# Patient Record
Sex: Male | Born: 1955
Health system: Southern US, Community
[De-identification: ages and names within clinical notes are randomized; demographics above are authoritative.]

## PROBLEM LIST (undated history)

## (undated) ENCOUNTER — Emergency Department (HOSPITAL_COMMUNITY): Admission: EM | Payer: Non-veteran care | Source: Home / Self Care

## (undated) DIAGNOSIS — F1010 Alcohol abuse, uncomplicated: Secondary | ICD-10-CM

## (undated) DIAGNOSIS — E119 Type 2 diabetes mellitus without complications: Secondary | ICD-10-CM

## (undated) DIAGNOSIS — B029 Zoster without complications: Secondary | ICD-10-CM

## (undated) DIAGNOSIS — I639 Cerebral infarction, unspecified: Secondary | ICD-10-CM

## (undated) DIAGNOSIS — F1490 Cocaine use, unspecified, uncomplicated: Secondary | ICD-10-CM

## (undated) DIAGNOSIS — G629 Polyneuropathy, unspecified: Secondary | ICD-10-CM

## (undated) DIAGNOSIS — F329 Major depressive disorder, single episode, unspecified: Secondary | ICD-10-CM

## (undated) DIAGNOSIS — F32A Depression, unspecified: Secondary | ICD-10-CM

## (undated) DIAGNOSIS — G5600 Carpal tunnel syndrome, unspecified upper limb: Secondary | ICD-10-CM

## (undated) DIAGNOSIS — I38 Endocarditis, valve unspecified: Secondary | ICD-10-CM

## (undated) DIAGNOSIS — F4310 Post-traumatic stress disorder, unspecified: Secondary | ICD-10-CM

## (undated) HISTORY — PX: TESTICLE TORSION REDUCTION: SHX795

---

## 2013-12-02 ENCOUNTER — Encounter (HOSPITAL_COMMUNITY): Payer: Self-pay | Admitting: *Deleted

## 2013-12-02 ENCOUNTER — Emergency Department (HOSPITAL_COMMUNITY)
Admission: EM | Admit: 2013-12-02 | Discharge: 2013-12-02 | Disposition: A | Discharge status: Home / Self Care | Payer: Non-veteran care | Attending: Emergency Medicine | Admitting: Emergency Medicine

## 2013-12-02 DIAGNOSIS — Z720 Tobacco use: Secondary | ICD-10-CM | POA: Insufficient documentation

## 2013-12-02 DIAGNOSIS — Z794 Long term (current) use of insulin: Secondary | ICD-10-CM

## 2013-12-02 DIAGNOSIS — IMO0001 Reserved for inherently not codable concepts without codable children: Secondary | ICD-10-CM

## 2013-12-02 DIAGNOSIS — Z79899 Other long term (current) drug therapy: Secondary | ICD-10-CM | POA: Insufficient documentation

## 2013-12-02 DIAGNOSIS — E1349 Other specified diabetes mellitus with other diabetic neurological complication: Secondary | ICD-10-CM | POA: Insufficient documentation

## 2013-12-02 DIAGNOSIS — R200 Anesthesia of skin: Secondary | ICD-10-CM | POA: Insufficient documentation

## 2013-12-02 DIAGNOSIS — E119 Type 2 diabetes mellitus without complications: Secondary | ICD-10-CM

## 2013-12-02 DIAGNOSIS — G629 Polyneuropathy, unspecified: Secondary | ICD-10-CM | POA: Insufficient documentation

## 2013-12-02 HISTORY — DX: Polyneuropathy, unspecified: G62.9

## 2013-12-02 HISTORY — DX: Type 2 diabetes mellitus without complications: E11.9

## 2013-12-02 LAB — CBG MONITORING, ED: Glucose-Capillary: 483 mg/dL — ABNORMAL HIGH (ref 70–99)

## 2013-12-02 MED ORDER — GABAPENTIN 100 MG PO CAPS: 100.0000 mg | ORAL_CAPSULE | Freq: Three times a day (TID) | ORAL | Status: DC

## 2013-12-02 NOTE — ED Provider Notes (Signed)
CSN: [id number]     Arrival date & time 12/02/13  1156 History  This chart was scribed for non-physician practitioner Dierdre Forth, PA-C, working with Linwood Dibbles, MD by Littie Deeds, ED Scribe. This patient was seen in room TR08C/TR08C and the patient's care was started at 2:08 PM.     Chief Complaint  Patient presents with  . Foot Pain    The history is provided by the patient. No language interpreter was used.   HPI Comments: William Acevedo is a 58 y.o. male with a hx of DM and diabetic neuropathy who presents to the Emergency Department complaining of gradual onset, gradually worsening, constant, severe, striking bilateral foot pain that has worsened today. Patient normally takes Gabapentin (1 pill per day) for his pain, but has run out because he has shared some with his sister who has similar symptoms but worse than his. He normally gets his medication from the [place], but will not get any more medication until December 1. Patient denies any injuries. He has not been taking his insulin as directed or checking his CBG, but reports that he has plenty of insulin at home.  Patient denies fever, polyuria, polydipsia, abdominal pain, nausea, vomiting, foot wounds.  Past Medical History  Diagnosis Date  . Diabetes mellitus without complication   . Neuropathy    History reviewed. No pertinent past surgical history. History reviewed. No pertinent family history. History  Substance Use Topics  . Smoking status: Current Every Day Smoker  . Smokeless tobacco: Not on file  . Alcohol Use: Not on file    Review of Systems  Constitutional: Negative for fever, chills, diaphoresis, appetite change, fatigue and unexpected weight change.  HENT: Negative for mouth sores.   Eyes: Negative for visual disturbance.  Respiratory: Negative for cough, chest tightness, shortness of breath and wheezing.   Cardiovascular: Negative for chest pain.  Gastrointestinal: Negative for nausea, vomiting,  abdominal pain, diarrhea and constipation.  Endocrine: Negative for polydipsia, polyphagia and polyuria.  Genitourinary: Negative for dysuria, urgency, frequency and hematuria.  Musculoskeletal: Positive for myalgias and arthralgias. Negative for back pain and neck stiffness.  Skin: Negative for rash.  Allergic/Immunologic: Negative for immunocompromised state.  Neurological: Positive for numbness (feet). Negative for syncope, light-headedness and headaches.  Hematological: Does not bruise/bleed easily.  Psychiatric/Behavioral: Negative for sleep disturbance. The patient is not nervous/anxious.       Allergies  Review of patient's allergies indicates no known allergies.  Home Medications   Prior to Admission medications   Medication Sig Start Date End Date Taking? Authorizing Provider  traZODone (DESYREL) 50 MG tablet Take 50 mg by mouth daily as needed for sleep.   Yes Historical Provider, MD  gabapentin (NEURONTIN) 100 MG capsule Take 1 capsule (100 mg total) by mouth 3 (three) times daily. 12/02/13   Mariesha Venturella, PA-C   BP 129/98 mmHg  Pulse 82  Temp(Src) 98.4 F (36.9 C) (Oral)  Resp 18  Ht 5\' 6"  (1.676 m)  Wt 135 lb (61.236 kg)  BMI 21.80 kg/m2  SpO2 100% Physical Exam  Constitutional: He appears well-developed and well-nourished. No distress.  Awake, alert, nontoxic appearance  HENT:  Head: Normocephalic and atraumatic.  Mouth/Throat: Oropharynx is clear and moist. No oropharyngeal exudate.  Moist mucous membranes  Eyes: Conjunctivae are normal. No scleral icterus.  Neck: Normal range of motion. Neck supple.  Cardiovascular: Normal rate, regular rhythm, normal heart sounds and intact distal pulses.   No murmur heard. Capillary refill less than 3  seconds  Pulmonary/Chest: Effort normal and breath sounds normal. No respiratory distress. He has no wheezes.  Equal chest expansion  Abdominal: Soft. Bowel sounds are normal. He exhibits no mass. There is no  tenderness. There is no rebound and no guarding.  Abdomen soft and nontender  Musculoskeletal: Normal range of motion. He exhibits tenderness. He exhibits no edema.  ROM: full range of motion of all joints of the bilateral lower extremities  Neurological: He is alert. Coordination normal.  Sensation intact to dull and sharp, but diminished in the feet Strength 5/5 the bilateral lower extremities  Skin: Skin is warm and dry. He is not diaphoretic. No erythema.  No tenting of the skin No lesions noted to the feet, small calluses noted on bilateral feet without erythema, induration or evidence of cellulitis  Psychiatric: He has a normal mood and affect.  Nursing note and vitals reviewed.   ED Course  Procedures  DIAGNOSTIC STUDIES: Oxygen Saturation is 100% on room air, normal by my interpretation.    COORDINATION OF CARE: 2:12 PM-Discussed treatment plan which includes Gabapentin refill with pt at bedside and pt agreed to plan.    Labs Review Labs Reviewed  CBG MONITORING, ED - Abnormal; Notable for the following:    Glucose-Capillary 483 (*)    All other components within normal limits    Imaging Review No results found.   EKG Interpretation None      MDM   Final diagnoses:  Other diabetic neurological complication associated with other specified diabetes mellitus  IDDM (insulin dependent diabetes mellitus)    William Acevedo resents for refill of his gabapentin. Patient does not know how much she was taking it but states he took it once per day. He also requests referral to podiatrist and primary care. Will give things. Patient has not been checking blood sugar however feet are without evidence of diabetic wounds.    Pt CBG 483 and I expressed significant concern about this to the patient.  I have recommended blood work, fluids and insulin here in the hospital today.  Pt reports he will not stay for this.  He reports he has insulin at home that he can take.    We  discussed the nature and purpose, risks and benefits, as well as, the alternatives of treatment. Time was given to allow the opportunity to ask questions and consider options, and after the discussion, the patient decided to refuse the offerred treatment for his hyperglycemia. The patient was informed that refusal could lead to, but was not limited to, death, permanent disability, or severe pain and included the potential for permanent organ damage. No relatives were present to discuss this with. Prior to refusing, I determined that the patient had the capacity to make their decision and understood the consequences of that decision. After refusal, I made every reasonable opportunity to treat them to the best of my ability. Pt was given a refill of his gabapentin.   The patient was notified that they may return to the emergency department at any time for further treatment.    I have personally reviewed patient's vitals, nursing note and any pertinent labs or imaging.  Vital signs are stable at discharge and pt does not appear septic or toxic.   BP 129/98 mmHg  Pulse 82  Temp(Src) 98.4 F (36.9 C) (Oral)  Resp 18  Ht 5\' 6"  (1.676 m)  Wt 135 lb (61.236 kg)  BMI 21.80 kg/m2  SpO2 100%  I personally performed the  services described in this documentation, which was scribed in my presence. The recorded information has been reviewed and is accurate.    Dierdre ForthHannah Arland Usery, PA-C 12/02/13 1644  Linwood DibblesJon Knapp, MD 12/03/13 [phone]

## 2013-12-02 NOTE — ED Notes (Signed)
CBG 483 

## 2013-12-02 NOTE — ED Notes (Signed)
Patient states doesn't want to stay for additional treatment.   Advised PA  - who went and spoke with patient about CBG being high.  Patient advised he would take care of it at home.

## 2013-12-02 NOTE — Discharge Instructions (Signed)
1. Medications: gabapentin refill, usual home medications 2. Treatment: rest, drink plenty of fluids, take your insulin as directed, monitor blood glucose 3. Follow Up: Please followup with your primary doctor in 2 days for discussion of your diagnoses and further evaluation after today's visit; if you do not have a primary care doctor use the resource guide provided to find one; Please return to the ER for worsening symptoms, polyuria, polydipsia, abdominal pain or other concerning symptoms.    Blood Glucose Monitoring Monitoring your blood glucose (also know as blood sugar) helps you to manage your diabetes. It also helps you and your health care provider monitor your diabetes and determine how well your treatment plan is working. WHY SHOULD YOU MONITOR YOUR BLOOD GLUCOSE?  It can help you understand how food, exercise, and medicine affect your blood glucose.  It allows you to know what your blood glucose is at any given moment. You can quickly tell if you are having low blood glucose (hypoglycemia) or high blood glucose (hyperglycemia).  It can help you and your health care provider know how to adjust your medicines.  It can help you understand how to manage an illness or adjust medicine for exercise. WHEN SHOULD YOU TEST? Your health care provider will help you decide how often you should check your blood glucose. This may depend on the type of diabetes you have, your diabetes control, or the types of medicines you are taking. Be sure to write down all of your blood glucose readings so that this information can be reviewed with your health care provider. See below for examples of testing times that your health care provider may suggest. Type 1 Diabetes  Test 4 times a day if you are in good control, using an insulin pump, or perform multiple daily injections.  If your diabetes is not well controlled or if you are sick, you may need to monitor more often.  It is a good idea to also  monitor:  Before and after exercise.  Between meals and 2 hours after a meal.  Occasionally between 2:00 a.m. and 3:00 a.m. Type 2 Diabetes  It can vary with each person, but generally, if you are on insulin, test 4 times a day.  If you take medicines by mouth (orally), test 2 times a day.  If you are on a controlled diet, test once a day.  If your diabetes is not well controlled or if you are sick, you may need to monitor more often. HOW TO MONITOR YOUR BLOOD GLUCOSE Supplies Needed  Blood glucose meter.  Test strips for your meter. Each meter has its own strips. You must use the strips that go with your own meter.  A pricking needle (lancet).  A device that holds the lancet (lancing device).  A journal or log book to write down your results. Procedure  Wash your hands with soap and water. Alcohol is not preferred.  Prick the side of your finger (not the tip) with the lancet.  Gently milk the finger until a small drop of blood appears.  Follow the instructions that come with your meter for inserting the test strip, applying blood to the strip, and using your blood glucose meter. Other Areas to Get Blood for Testing Some meters allow you to use other areas of your body (other than your finger) to test your blood. These areas are called alternative sites. The most common alternative sites are:  The forearm.  The thigh.  The back area of the  lower leg.  The palm of the hand. The blood flow in these areas is slower. Therefore, the blood glucose values you get may be delayed, and the numbers are different from what you would get from your fingers. Do not use alternative sites if you think you are having hypoglycemia. Your reading will not be accurate. Always use a finger if you are having hypoglycemia. Also, if you cannot feel your lows (hypoglycemia unawareness), always use your fingers for your blood glucose checks. ADDITIONAL TIPS FOR GLUCOSE MONITORING  Do not reuse  lancets.  Always carry your supplies with you.  All blood glucose meters have a 24-hour "hotline" number to call if you have questions or need help.  Adjust (calibrate) your blood glucose meter with a control solution after finishing a few boxes of strips. BLOOD GLUCOSE RECORD KEEPING It is a good idea to keep a daily record or log of your blood glucose readings. Most glucose meters, if not all, keep your glucose records stored in the meter. Some meters come with the ability to download your records to your home computer. Keeping a record of your blood glucose readings is especially helpful if you are wanting to look for patterns. Make notes to go along with the blood glucose readings because you might forget what happened at that exact time. Keeping good records helps you and your health care provider to work together to achieve good diabetes management.  Document Released: 01/06/2003 Document Revised: 05/20/2013 Document Reviewed: 05/28/2012 [facility] Patient Information 2015 [place], [place]. This information is not intended to replace advice given to you by your health care provider. Make sure you discuss any questions you have with your health care provider.   Peripheral Neuropathy Peripheral neuropathy is a type of nerve damage. It affects nerves that carry signals between the spinal cord and other parts of the body. These are called peripheral nerves. With peripheral neuropathy, one nerve or a group of nerves may be damaged.  CAUSES  Many things can damage peripheral nerves. For some people with peripheral neuropathy, the cause is unknown. Some causes include:  Diabetes. This is the most common cause of peripheral neuropathy.  Injury to a nerve.  Pressure or stress on a nerve that lasts a long time.  Too little vitamin B. Alcoholism can lead to this.  Infections.  Autoimmune diseases, such as multiple sclerosis and systemic lupus erythematosus.  Inherited nerve diseases.  Some  medicines, such as cancer drugs.  Toxic substances, such as lead and mercury.  Too little blood flowing to the legs.  Kidney disease.  Thyroid disease. SIGNS AND SYMPTOMS  Different people have different symptoms. The symptoms you have will depend on which of your nerves is damaged. Common symptoms include:  Loss of feeling (numbness) in the feet and hands.  Tingling in the feet and hands.  Pain that burns.  Very sensitive skin.  Weakness.  Not being able to move a part of the body (paralysis).  Muscle twitching.  Clumsiness or poor coordination.  Loss of balance.  Not being able to control your bladder.  Feeling dizzy.  Sexual problems. DIAGNOSIS  Peripheral neuropathy is a symptom, not a disease. Finding the cause of peripheral neuropathy can be hard. To figure that out, your health care provider will take a medical history and do a physical exam. A neurological exam will also be done. This involves checking things affected by your brain, spinal cord, and nerves (nervous system). For example, your health care provider will check your  reflexes, how you move, and what you can feel.  Other types of tests may also be ordered, such as:  Blood tests.  A test of the fluid in your spinal cord.  Imaging tests, such as CT scans or an MRI.  Electromyography (EMG). This test checks the nerves that control muscles.  Nerve conduction velocity tests. These tests check how fast messages pass through your nerves.  Nerve biopsy. A small piece of nerve is removed. It is then checked under a microscope. TREATMENT   Medicine is often used to treat peripheral neuropathy. Medicines may include:  Pain-relieving medicines. Prescription or over-the-counter medicine may be suggested.  Antiseizure medicine. This may be used for pain.  Antidepressants. These also may help ease pain from neuropathy.  Lidocaine. This is a numbing medicine. You might wear a patch or be given a  shot.  Mexiletine. This medicine is typically used to help control irregular heart rhythms.  Surgery. Surgery may be needed to relieve pressure on a nerve or to destroy a nerve that is causing pain.  Physical therapy to help movement.  Assistive devices to help movement. HOME CARE INSTRUCTIONS   Only take over-the-counter or prescription medicines as directed by your health care provider. Follow the instructions carefully for any given medicines. Do not take any other medicines without first getting approval from your health care provider.  If you have diabetes, work closely with your health care provider to keep your blood sugar under control.  If you have numbness in your feet:  Check every day for signs of injury or infection. Watch for redness, warmth, and swelling.  Wear padded socks and comfortable shoes. These help protect your feet.  Do not do things that put pressure on your damaged nerve.  Do not smoke. Smoking keeps blood from getting to damaged nerves.  Avoid or limit alcohol. Too much alcohol can cause a lack of B vitamins. These vitamins are needed for healthy nerves.  Develop a good support system. Coping with peripheral neuropathy can be stressful. Talk to a mental health specialist or join a support group if you are struggling.  Follow up with your health care provider as directed. SEEK MEDICAL CARE IF:   You have new signs or symptoms of peripheral neuropathy.  You are struggling emotionally from dealing with peripheral neuropathy.  You have a fever. SEEK IMMEDIATE MEDICAL CARE IF:   You have an injury or infection that is not healing.  You feel very dizzy or begin vomiting.  You have chest pain.  You have trouble breathing. Document Released: 12/24/2001 Document Revised: 09/15/2010 Document Reviewed: 09/10/2012 [facility] Patient Information 2015 [place], [place]. This information is not intended to replace advice given to you by your health care  provider. Make sure you discuss any questions you have with your health care provider.

## 2013-12-02 NOTE — ED Notes (Signed)
Pt in c/o pain to bilateral feet, states he has diabetic neuropathy and the pain is worse today, [place] [facility] [place] [facility] [place] [facility] [place] [facility]

## 2013-12-02 NOTE — ED Notes (Signed)
Patient states he is supposed to get [facility] [facility] [place], but has not called to check on shipment.   Patient states he has "been unable to call because of my depression".

## 2014-05-20 ENCOUNTER — Encounter (HOSPITAL_COMMUNITY): Payer: Self-pay | Admitting: Emergency Medicine

## 2014-05-20 ENCOUNTER — Emergency Department (HOSPITAL_COMMUNITY)
Admission: EM | Admit: 2014-05-20 | Discharge: 2014-05-20 | Disposition: A | Discharge status: Home / Self Care | Payer: Non-veteran care | Attending: Emergency Medicine | Admitting: Emergency Medicine

## 2014-05-20 DIAGNOSIS — Z79899 Other long term (current) drug therapy: Secondary | ICD-10-CM | POA: Insufficient documentation

## 2014-05-20 DIAGNOSIS — Z794 Long term (current) use of insulin: Secondary | ICD-10-CM | POA: Insufficient documentation

## 2014-05-20 DIAGNOSIS — M25512 Pain in left shoulder: Secondary | ICD-10-CM

## 2014-05-20 DIAGNOSIS — Z8669 Personal history of other diseases of the nervous system and sense organs: Secondary | ICD-10-CM | POA: Insufficient documentation

## 2014-05-20 DIAGNOSIS — Z720 Tobacco use: Secondary | ICD-10-CM | POA: Insufficient documentation

## 2014-05-20 DIAGNOSIS — M79641 Pain in right hand: Secondary | ICD-10-CM | POA: Insufficient documentation

## 2014-05-20 DIAGNOSIS — E119 Type 2 diabetes mellitus without complications: Secondary | ICD-10-CM | POA: Insufficient documentation

## 2014-05-20 MED ORDER — MELOXICAM 7.5 MG PO TABS: 7.5000 mg | ORAL_TABLET | Freq: Every day | ORAL | Status: DC

## 2014-05-20 MED ORDER — HYDROCODONE-ACETAMINOPHEN 5-325 MG PO TABS: 2.0000 | ORAL_TABLET | ORAL | Status: DC | PRN

## 2014-05-20 MED ORDER — KETOROLAC TROMETHAMINE 60 MG/2ML IM SOLN
60.0000 mg | Freq: Once | INTRAMUSCULAR | Status: AC
  Administered 2014-05-20: 60 mg via INTRAMUSCULAR
  Filled 2014-05-20: qty 2

## 2014-05-20 MED ORDER — OXYCODONE-ACETAMINOPHEN 5-325 MG PO TABS
1.0000 | ORAL_TABLET | Freq: Once | ORAL | Status: AC
  Administered 2014-05-20: 1 via ORAL
  Filled 2014-05-20: qty 1

## 2014-05-20 MED ORDER — METHYLPREDNISOLONE SODIUM SUCC 125 MG IJ SOLR
125.0000 mg | Freq: Once | INTRAMUSCULAR | Status: AC
  Administered 2014-05-20: 125 mg via INTRAMUSCULAR
  Filled 2014-05-20: qty 2

## 2014-05-20 NOTE — ED Provider Notes (Signed)
CSN: [id number]     Arrival date & time 05/20/14  7829 History   First MD Initiated Contact with Patient 05/20/14 1047     Chief Complaint  Patient presents with  . Shoulder Pain  . Hand Pain     (Consider location/radiation/quality/duration/timing/severity/associated sxs/prior Treatment) Patient is a 59 y.o. male presenting with shoulder pain and hand pain. The history is provided by the patient. No language interpreter was used.  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  Pt has been having problems with right hand x 6 years.  Pt reports he has carpal tunnel.  Pt reports he has pain in left shoulder for about 6 months.  Pt [facility]  [facility]  Pt reports pain increased after working on a roof yesterday.  Past Medical History  Diagnosis Date  . Diabetes mellitus without complication   . Neuropathy    Past Surgical History  Procedure Laterality Date  . Testicle torsion reduction     No family history on file. History  Substance Use Topics  . Smoking status: Current Every Day Smoker -- 0.25 packs/day  . Smokeless tobacco: Not on file  . Alcohol Use: No    Review of Systems  Musculoskeletal: Negative for back pain.  All other systems reviewed and are negative.     Allergies  Review of patient's allergies indicates no known allergies.  Home Medications   Prior to Admission medications   Medication Sig Start Date End Date Taking? Authorizing Provider  glipiZIDE (GLUCOTROL) 5 MG tablet Take 5 mg by  mouth daily before breakfast.   Yes Historical Provider, MD  insulin glargine (LANTUS) 100 UNIT/ML injection Inject 25 Units into the skin every morning.   Yes Historical Provider, MD  metFORMIN (GLUCOPHAGE) 500 MG tablet Take 1,000 mg by mouth 2 (two) times daily with a meal.   Yes Historical Provider, MD  omeprazole (PRILOSEC) 20 MG capsule Take 20 mg by mouth daily.   Yes Historical Provider, MD  sertraline (ZOLOFT) 50 MG tablet Take 50 mg by mouth daily.   Yes Historical Provider, MD  gabapentin (NEURONTIN) 100 MG capsule Take 1 capsule (100 mg total) by mouth 3 (three) times daily. Patient not taking: Reported on 05/20/2014 12/02/13   Dahlia Client Muthersbaugh, PA-C  HYDROcodone-acetaminophen (NORCO/VICODIN) 5-325 MG per tablet Take 2 tablets by mouth every 4 (four) hours as needed. 05/20/14   Elson Areas, PA-C  meloxicam (MOBIC) 7.5 MG tablet Take 1 tablet (7.5 mg total) by mouth daily. 05/20/14   Elson Areas, PA-C  traZODone (DESYREL) 50 MG tablet Take 50 mg by mouth daily as needed for sleep.    Historical Provider, MD   BP 150/75 mmHg  Pulse 76  Temp(Src) 97.8 F (36.6 C) (Oral)  Resp 18  SpO2 98% Physical Exam  Constitutional: He is oriented to person, place, and time. He appears well-developed and well-nourished.  HENT:  Head: Normocephalic.  Eyes: EOM are normal.  Neck: Normal  range of motion.  Cardiovascular: Normal rate.   Pulmonary/Chest: Effort normal.  Abdominal: He exhibits no distension.  Musculoskeletal:  Tender right hand,  No deformity,  nv intact.  Left shoulder  Limited range of motion,  nv and ns intact  Pain with palpation  Neurological: He is alert and oriented to person, place, and time.  Psychiatric: He has a normal mood and affect.  Nursing note and vitals reviewed.   ED Course  Procedures (including critical care time) Labs Review Labs Reviewed - No data to display  Imaging Review No results found.   EKG Interpretation None      MDM  Pt is  diabetic  I will treat with im dose of solumedrol.  Pt to watch glucose levels.  Torodol for pain.   Pt given rx for meloxicam and  Hydrocodone.     Final diagnoses:  Shoulder pain, left  Hand pain, right    [facility]    Elson AreasLeslie K Sofia, PA-C 05/20/14 1204  Derwood KaplanAnkit Nanavati, MD 05/22/14 [phone]

## 2014-05-20 NOTE — Discharge Instructions (Signed)

## 2014-05-20 NOTE — ED Notes (Signed)
Pt reports that he began having intense left shoulder and right hand pain after working on a roof yesterday. Pt alert x4.

## 2014-06-02 ENCOUNTER — Emergency Department (HOSPITAL_COMMUNITY)
Admission: EM | Admit: 2014-06-02 | Discharge: 2014-06-02 | Disposition: A | Discharge status: Home / Self Care | Payer: Non-veteran care | Attending: Emergency Medicine | Admitting: Emergency Medicine

## 2014-06-02 ENCOUNTER — Encounter (HOSPITAL_COMMUNITY): Payer: Self-pay | Admitting: Nurse Practitioner

## 2014-06-02 DIAGNOSIS — S6991XA Unspecified injury of right wrist, hand and finger(s), initial encounter: Secondary | ICD-10-CM | POA: Insufficient documentation

## 2014-06-02 DIAGNOSIS — G629 Polyneuropathy, unspecified: Secondary | ICD-10-CM | POA: Insufficient documentation

## 2014-06-02 DIAGNOSIS — Z720 Tobacco use: Secondary | ICD-10-CM | POA: Insufficient documentation

## 2014-06-02 DIAGNOSIS — Y9289 Other specified places as the place of occurrence of the external cause: Secondary | ICD-10-CM | POA: Insufficient documentation

## 2014-06-02 DIAGNOSIS — Z791 Long term (current) use of non-steroidal anti-inflammatories (NSAID): Secondary | ICD-10-CM | POA: Insufficient documentation

## 2014-06-02 DIAGNOSIS — Z79899 Other long term (current) drug therapy: Secondary | ICD-10-CM | POA: Insufficient documentation

## 2014-06-02 DIAGNOSIS — Z794 Long term (current) use of insulin: Secondary | ICD-10-CM | POA: Insufficient documentation

## 2014-06-02 DIAGNOSIS — Y9389 Activity, other specified: Secondary | ICD-10-CM | POA: Insufficient documentation

## 2014-06-02 DIAGNOSIS — M25512 Pain in left shoulder: Secondary | ICD-10-CM

## 2014-06-02 DIAGNOSIS — S8992XA Unspecified injury of left lower leg, initial encounter: Secondary | ICD-10-CM | POA: Insufficient documentation

## 2014-06-02 DIAGNOSIS — M25531 Pain in right wrist: Secondary | ICD-10-CM

## 2014-06-02 DIAGNOSIS — Y998 Other external cause status: Secondary | ICD-10-CM | POA: Insufficient documentation

## 2014-06-02 DIAGNOSIS — W1839XA Other fall on same level, initial encounter: Secondary | ICD-10-CM | POA: Insufficient documentation

## 2014-06-02 DIAGNOSIS — G8929 Other chronic pain: Secondary | ICD-10-CM

## 2014-06-02 DIAGNOSIS — E119 Type 2 diabetes mellitus without complications: Secondary | ICD-10-CM | POA: Insufficient documentation

## 2014-06-02 MED ORDER — KETOROLAC TROMETHAMINE 60 MG/2ML IM SOLN
60.0000 mg | Freq: Once | INTRAMUSCULAR | Status: AC
  Administered 2014-06-02: 60 mg via INTRAMUSCULAR
  Filled 2014-06-02: qty 2

## 2014-06-02 MED ORDER — DICLOFENAC POTASSIUM 50 MG PO TABS
50.0000 mg | ORAL_TABLET | Freq: Three times a day (TID) | ORAL | Status: DC
Start: 2014-06-02 — End: 2014-09-05

## 2014-06-02 NOTE — ED Notes (Signed)
Pt c/o chronic R hand and L shoulder pain.  He has history of carpal tunnel and rotator cuff injury. He tried meloxicam and an unknown muscle relaxer at home with no relief.

## 2014-06-02 NOTE — Discharge Instructions (Signed)
Please follow up with your primary care physician in 1-2 days. If you do not have one please call the [facility] number listed above. Please follow up with Dr. Ophelia CharterYates to schedule a follow up appointment.  Please read all discharge instructions and return precautions.   Shoulder Pain The shoulder is the joint that connects your arms to your body. The bones that form the shoulder joint include the upper arm bone (humerus), the shoulder blade (scapula), and the collarbone (clavicle). The top of the humerus is shaped like a ball and fits into a rather flat socket on the scapula (glenoid cavity). A combination of muscles and strong, fibrous tissues that connect muscles to bones (tendons) support your shoulder joint and hold the ball in the socket. Small, fluid-filled sacs (bursae) are located in different areas of the joint. They act as cushions between the bones and the overlying soft tissues and help reduce friction between the gliding tendons and the bone as you move your arm. Your shoulder joint allows a wide range of motion in your arm. This range of motion allows you to do things like scratch your back or throw a ball. However, this range of motion also makes your shoulder more prone to pain from overuse and injury. Causes of shoulder pain can originate from both injury and overuse and usually can be grouped in the following four categories:  Redness, swelling, and pain (inflammation) of the tendon (tendinitis) or the bursae (bursitis).  Instability, such as a dislocation of the joint.  Inflammation of the joint (arthritis).  Broken bone (fracture). HOME CARE INSTRUCTIONS   Apply ice to the sore area.  Put ice in a plastic bag.  Place a towel between your skin and the bag.  Leave the ice on for 15-20 minutes, 3-4 times per day for the first 2 days, or as directed by your health care provider.  Stop using cold packs if they do not help with the pain.  If you have a  shoulder sling or immobilizer, wear it as long as your caregiver instructs. Only remove it to shower or bathe. Move your arm as little as possible, but keep your hand moving to prevent swelling.  Squeeze a soft ball or foam pad as much as possible to help prevent swelling.  Only take over-the-counter or prescription medicines for pain, discomfort, or fever as directed by your caregiver. SEEK MEDICAL CARE IF:   Your shoulder pain increases, or new pain develops in your arm, hand, or fingers.  Your hand or fingers become cold and numb.  Your pain is not relieved with medicines. SEEK IMMEDIATE MEDICAL CARE IF:   Your arm, hand, or fingers are numb or tingling.  Your arm, hand, or fingers are significantly swollen or turn white or blue. MAKE SURE YOU:   Understand these instructions.  Will watch your condition.  Will get help right away if you are not doing well or get worse. Document Released: 10/13/2004 Document Revised: 05/20/2013 Document Reviewed: 12/18/2010 [facility] Patient Information 2015 [employer] [place]. This information is not intended to replace advice given to you by your health care provider. Make sure you discuss any questions you have with your health care provider.

## 2014-06-02 NOTE — ED Provider Notes (Signed)
CSN: [id number]     Arrival date & time 06/02/14  1603 History  This chart was scribed for non-physician practitioner, Francee PiccoloJennifer Gerrad Welker, working with Raeford RazorStephen Kohut, MD by William Acevedo, ED Scribe. This patient was seen in room TR01C/TR01C and the patient's care was started at 4:54 PM.   Chief Complaint  Patient presents with  . Hand Pain  . Shoulder Pain   The history is provided by the patient. No language interpreter was used.   HPI Comments: William Acevedo is a 59 y.o. male with a history of DM and neuropathy who presents to the Emergency Department complaining of chronic right hand pain. He reports a history of carpal tunnel syndrome in his right hand. Pt states he was changing the floor when he fell through the floor because it was rotten this morning. He says that the fall was only a few feet and he landed on his feet. Pt denies any head trauma or LOC. He also complains of left shoulder pain and says he "thinks he has a torn rotator cuff." He states he has never seen an orthopedic surgeon for his left shoulder. Pt denies any lower extremity pain.   Past Medical History  Diagnosis Date  . Diabetes mellitus without complication   . Neuropathy    Past Surgical History  Procedure Laterality Date  . Testicle torsion reduction     History reviewed. No pertinent family history. History  Substance Use Topics  . Smoking status: Current Every Day Smoker -- 0.25 packs/day  . Smokeless tobacco: Not on file  . Alcohol Use: No    Review of Systems  Musculoskeletal: Positive for myalgias and arthralgias.  Neurological: Negative for numbness.  All other systems reviewed and are negative.   Allergies  Review of patient's allergies indicates no known allergies.  Home Medications   Prior to Admission medications   Medication Sig Start Date End Date Taking? Authorizing Provider  diclofenac (CATAFLAM) 50 MG tablet Take 1 tablet (50 mg total) by mouth 3 (three) times daily.  06/02/14   Liliann File, PA-C  gabapentin (NEURONTIN) 100 MG capsule Take 1 capsule (100 mg total) by mouth 3 (three) times daily. Patient not taking: Reported on 05/20/2014 12/02/13   Dahlia ClientHannah Muthersbaugh, PA-C  glipiZIDE (GLUCOTROL) 5 MG tablet Take 5 mg by mouth daily before breakfast.    Historical Provider, MD  HYDROcodone-acetaminophen (NORCO/VICODIN) 5-325 MG per tablet Take 2 tablets by mouth every 4 (four) hours as needed. 05/20/14   Elson AreasLeslie K Sofia, PA-C  insulin glargine (LANTUS) 100 UNIT/ML injection Inject 25 Units into the skin every morning.    Historical Provider, MD  meloxicam (MOBIC) 7.5 MG tablet Take 1 tablet (7.5 mg total) by mouth daily. 05/20/14   Elson AreasLeslie K Sofia, PA-C  metFORMIN (GLUCOPHAGE) 500 MG tablet Take 1,000 mg by mouth 2 (two) times daily with a meal.    Historical Provider, MD  omeprazole (PRILOSEC) 20 MG capsule Take 20 mg by mouth daily.    Historical Provider, MD  sertraline (ZOLOFT) 50 MG tablet Take 50 mg by mouth daily.    Historical Provider, MD  traZODone (DESYREL) 50 MG tablet Take 50 mg by mouth daily as needed for sleep.    Historical Provider, MD   BP 146/70 mmHg  Pulse 74  Temp(Src) 98 F (36.7 C) (Oral)  Resp 18  Ht 5\' 6"  (1.676 m)  Wt 136 lb 2 oz (61.746 kg)  BMI 21.98 kg/m2  SpO2 98% Physical Exam  Constitutional: He is  oriented to person, place, and time. He appears well-developed and well-nourished. No distress.  HENT:  Head: Normocephalic and atraumatic.  Right Ear: External ear normal.  Left Ear: External ear normal.  Nose: Nose normal.  Mouth/Throat: Oropharynx is clear and moist.  Eyes: Conjunctivae and EOM are normal. Pupils are equal, round, and reactive to light.  Neck: Normal range of motion. Neck supple. No spinous process tenderness present.  No nuchal rigidity.   Cardiovascular: Normal rate, regular rhythm, normal heart sounds and intact distal pulses.   Pulmonary/Chest: Effort normal and breath sounds normal.  Abdominal:  Soft. There is no tenderness.  Musculoskeletal: Normal range of motion. He exhibits no edema.       Back:  Tender right hand, No deformity, nv intact. Left shoulder Limited range of motion, nv and ns intact Pain with palpation. No spinous process tenderness  Neurological: He is alert and oriented to person, place, and time.  Skin: Skin is warm and dry. He is not diaphoretic.  Psychiatric: He has a normal mood and affect.  Nursing note and vitals reviewed.   ED Course  Procedures   Medications  ketorolac (TORADOL) injection 60 mg (60 mg Intramuscular Given 06/02/14 1646)    DIAGNOSTIC STUDIES: Oxygen Saturation is 97% on RA, normal by my interpretation.    COORDINATION OF CARE: 4:58 PM Discussed treatment plan with pt at bedside and pt agreed to plan.   Labs Review Labs Reviewed - No data to display  Imaging Review No results found.   EKG Interpretation None      MDM   Final diagnoses:  Chronic left shoulder pain  Chronic wrist pain, right    Filed Vitals:   06/02/14 1706  BP: 146/70  Pulse: 74  Temp: 98 F (36.7 C)  Resp: 18   Afebrile, NAD, non-toxic appearing, AAOx4. Physical examination unremarkable, consistent with previous visits. Neurovascularly intact. Normal sensation. No evidence of compartment syndrome. Advised orthopedic f/u. Return precautions discussed. Patient is agreeable to plan. Patient is stable at time of discharge    I personally performed the services described in this documentation, which was scribed in my presence. The recorded information has been reviewed and is accurate.       Francee PiccoloJennifer Ersilia Brawley, PA-C 06/02/14 1725  Raeford RazorStephen Kohut, MD 06/04/14 1229

## 2014-07-23 ENCOUNTER — Emergency Department (HOSPITAL_COMMUNITY)
Admission: EM | Admit: 2014-07-23 | Discharge: 2014-07-23 | Disposition: A | Discharge status: Home / Self Care | Payer: Non-veteran care | Attending: Emergency Medicine | Admitting: Emergency Medicine

## 2014-07-23 ENCOUNTER — Encounter (HOSPITAL_COMMUNITY): Payer: Self-pay | Admitting: Emergency Medicine

## 2014-07-23 DIAGNOSIS — Z720 Tobacco use: Secondary | ICD-10-CM | POA: Insufficient documentation

## 2014-07-23 DIAGNOSIS — G479 Sleep disorder, unspecified: Secondary | ICD-10-CM | POA: Diagnosis not present

## 2014-07-23 DIAGNOSIS — Z791 Long term (current) use of non-steroidal anti-inflammatories (NSAID): Secondary | ICD-10-CM | POA: Diagnosis not present

## 2014-07-23 DIAGNOSIS — R200 Anesthesia of skin: Secondary | ICD-10-CM | POA: Diagnosis not present

## 2014-07-23 DIAGNOSIS — E119 Type 2 diabetes mellitus without complications: Secondary | ICD-10-CM | POA: Insufficient documentation

## 2014-07-23 DIAGNOSIS — F329 Major depressive disorder, single episode, unspecified: Secondary | ICD-10-CM | POA: Diagnosis not present

## 2014-07-23 DIAGNOSIS — M542 Cervicalgia: Secondary | ICD-10-CM | POA: Insufficient documentation

## 2014-07-23 DIAGNOSIS — M549 Dorsalgia, unspecified: Secondary | ICD-10-CM | POA: Diagnosis not present

## 2014-07-23 DIAGNOSIS — M25512 Pain in left shoulder: Secondary | ICD-10-CM | POA: Diagnosis not present

## 2014-07-23 DIAGNOSIS — G8929 Other chronic pain: Secondary | ICD-10-CM | POA: Insufficient documentation

## 2014-07-23 DIAGNOSIS — Z79899 Other long term (current) drug therapy: Secondary | ICD-10-CM | POA: Insufficient documentation

## 2014-07-23 DIAGNOSIS — R202 Paresthesia of skin: Secondary | ICD-10-CM | POA: Insufficient documentation

## 2014-07-23 HISTORY — DX: Carpal tunnel syndrome, unspecified upper limb: G56.00

## 2014-07-23 HISTORY — DX: Major depressive disorder, single episode, unspecified: F32.9

## 2014-07-23 HISTORY — DX: Depression, unspecified: F32.A

## 2014-07-23 MED ORDER — METHOCARBAMOL 500 MG PO TABS: 500.0000 mg | ORAL_TABLET | Freq: Two times a day (BID) | ORAL | Status: DC

## 2014-07-23 MED ORDER — HYDROCODONE-ACETAMINOPHEN 5-325 MG PO TABS: 1.0000 | ORAL_TABLET | Freq: Four times a day (QID) | ORAL | Status: DC | PRN

## 2014-07-23 NOTE — ED Provider Notes (Signed)
CSN: [id number]     Arrival date & time 07/23/14  1045 History  This chart was scribed for non-physician practitioner, Santiago GladHeather Kmarion Rawl, PA-C working with Geoffery Lyonsouglas Delo, MD by Placido SouLogan Joldersma, ED scribe. This patient was seen in room TR07C/TR07C and the patient's care was started at 12:19 PM.    Chief Complaint  Patient presents with  . Neck Pain  . Shoulder Pain   The history is provided by the patient. No language interpreter was used.    HPI Comments: William Acevedo is a 59 y.o. male, with a history of IDDM, who presents to the Emergency Department complaining of chronic, moderate, pain to his neck and left shoulder. Pt also notes chronic, intermittent, tingling and numbness in his bilateral hands and feet and further notes a history of carpel tunnel syndrome. Pt notes receiving an [facility] at [facility] and is unsure of the results and reports that he is frustrated by the care he is receiving at the [facility]. He notes that he has been told he needs to have surgery for his chronic issues.  Pt notes taking sleeping aids, OTC pain medications and unprescribed prescription pain medications with little relief of his symptoms. Pt denies any new recent trauma to the affected areas. He denies fever, chills, and incontinence of his bowels or bladder.    Past Medical History  Diagnosis Date  . Diabetes mellitus without complication   . Neuropathy   . Depression   . Carpal tunnel syndrome    Past Surgical History  Procedure Laterality Date  . Testicle torsion reduction     No family history on file. History  Substance Use Topics  . Smoking status: Current Every Day Smoker -- 0.50 packs/day    Types: Cigarettes  . Smokeless tobacco: Not on file  . Alcohol Use: No    Review of Systems  Constitutional: Negative for fever and chills.  Gastrointestinal:       Denies incontinence of bowels  Genitourinary:       Denies incontinence of bladder  Musculoskeletal: Positive for myalgias, back pain,  arthralgias, neck pain and neck stiffness.  Neurological: Positive for numbness.  Psychiatric/Behavioral: Positive for sleep disturbance.      Allergies  Review of patient's allergies indicates no known allergies.  Home Medications   Prior to Admission medications   Medication Sig Start Date End Date Taking? Authorizing Provider  diclofenac (CATAFLAM) 50 MG tablet Take 1 tablet (50 mg total) by mouth 3 (three) times daily. 06/02/14   Jennifer Piepenbrink, PA-C  gabapentin (NEURONTIN) 100 MG capsule Take 1 capsule (100 mg total) by mouth 3 (three) times daily. Patient not taking: Reported on 05/20/2014 12/02/13   Dahlia ClientHannah Muthersbaugh, PA-C  glipiZIDE (GLUCOTROL) 5 MG tablet Take 5 mg by mouth daily before breakfast.    Historical Provider, MD  HYDROcodone-acetaminophen (NORCO/VICODIN) 5-325 MG per tablet Take 2 tablets by mouth every 4 (four) hours as needed. 05/20/14   Elson AreasLeslie K Sofia, PA-C  insulin glargine (LANTUS) 100 UNIT/ML injection Inject 25 Units into the skin every morning.    Historical Provider, MD  meloxicam (MOBIC) 7.5 MG tablet Take 1 tablet (7.5 mg total) by mouth daily. 05/20/14   Elson AreasLeslie K Sofia, PA-C  metFORMIN (GLUCOPHAGE) 500 MG tablet Take 1,000 mg by mouth 2 (two) times daily with a meal.    Historical Provider, MD  omeprazole (PRILOSEC) 20 MG capsule Take 20 mg by mouth daily.    Historical Provider, MD  sertraline (ZOLOFT) 50 MG tablet Take  50 mg by mouth daily.    Historical Provider, MD  traZODone (DESYREL) 50 MG tablet Take 50 mg by mouth daily as needed for sleep.    Historical Provider, MD   BP 123/89 mmHg  Pulse 87  Temp(Src) 98.4 F (36.9 C) (Oral)  Resp 24  SpO2 99% Physical Exam  Constitutional: He is oriented to person, place, and time. He appears well-developed and well-nourished. No distress.  HENT:  Head: Normocephalic and atraumatic.  Mouth/Throat: Oropharynx is clear and moist.  Eyes: Conjunctivae and EOM are normal. Pupils are equal, round, and  reactive to light.  Neck: Normal range of motion. Neck supple. No tracheal deviation present.  Cardiovascular: Normal rate, regular rhythm, normal heart sounds and intact distal pulses.  Exam reveals no gallop and no friction rub.   No murmur heard. 2+ radial pulses bilaterally   Pulmonary/Chest: Effort normal and breath sounds normal. No respiratory distress. He has no wheezes. He has no rales.  Abdominal: Soft.  Musculoskeletal: Normal range of motion. He exhibits tenderness.  Left sided TTP over trapezius and left thoracic paraspinal; No TTP of C, T, or L spine; no step offs or deformities    Neurological: He is alert and oriented to person, place, and time.  Reflex Scores:      Brachioradialis reflexes are 2+ on the right side and 2+ on the left side.      Patellar reflexes are 2+ on the right side and 2+ on the left side. Grip strength 4/5 bilaterally with sensation of both hands intact; 2+ patellar reflexes; 2+ brachioradialis reflexes; muscle strengths 5/5 of lower extremities bilaterally  Skin: Skin is warm and dry.  Psychiatric: He has a normal mood and affect. His behavior is normal.  Nursing note and vitals reviewed.   ED Course  Procedures  DIAGNOSTIC STUDIES: Oxygen Saturation is 99% on RA, normal by my interpretation.    COORDINATION OF CARE: 12:25 PM Discussed treatment plan with pt at bedside and pt agreed to plan.  Labs Review Labs Reviewed - No data to display  Imaging Review No results found.   EKG Interpretation None      MDM   Final diagnoses:  None  Patient presents today with chronic neck pain and chronic shoulder pain.  No acute injury or trauma.  Patient neurovascularly intact.  He reports recent MRI of his shoulder done at the [place].  Do not feel that additional imaging is indicated at this time.  Feel that the patient is stable for discharge.  Return precautions given.    I personally performed the services described in this documentation, which  was scribed in my presence. The recorded information has been reviewed and is accurate.    Santiago Glad, PA-C 07/24/14 1610  Geoffery Lyons, MD 07/24/14 [phone]

## 2014-07-23 NOTE — Discharge Instructions (Signed)
Followup with orthopedics if symptoms continue. Use conservative methods at home including heat therapy and cold therapy as we discussed. More information on cold therapy is listed below.  It is not recommended to use heat treatment directly after an acute injury.  Take pain medication and muscle relaxer as needed for pain.  Do not drive or operate heavy machinery for 4-6 hours after taking medication.  SEEK IMMEDIATE MEDICAL ATTENTION IF: New numbness, tingling, weakness, or problem with the use of your arms or legs.  Severe back pain not relieved with medications.  Change in bowel or bladder control.  Increasing pain in any areas of the body (such as chest or abdominal pain).  Shortness of breath, dizziness or fainting.  Nausea (feeling sick to your stomach), vomiting, fever, or sweats.  COLD THERAPY DIRECTIONS:  Ice or gel packs can be used to reduce both pain and swelling. Ice is the most helpful within the first 24 to 48 hours after an injury or flareup from overusing a muscle or joint.  Ice is effective, has very few side effects, and is safe for most people to use.   If you expose your skin to cold temperatures for too long or without the proper protection, you can damage your skin or nerves. Watch for signs of skin damage due to cold.   HOME CARE INSTRUCTIONS  Follow these tips to use ice and cold packs safely.  Place a dry or damp towel between the ice and skin. A damp towel will cool the skin more quickly, so you may need to shorten the time that the ice is used.  For a more rapid response, add gentle compression to the ice.  Ice for no more than 10 to 20 minutes at a time. The bonier the area you are icing, the less time it will take to get the benefits of ice.  Check your skin after 5 minutes to make sure there are no signs of a poor response to cold or skin damage.  Rest 20 minutes or more in between uses.  Once your skin is numb, you can end your treatment. You can test numbness  by very lightly touching your skin. The touch should be so light that you do not see the skin dimple from the pressure of your fingertip. When using ice, most people will feel these normal sensations in this order: cold, burning, aching, and numbness.  Do not use ice on someone who cannot communicate their responses to pain, such as small children or people with dementia.   HOW TO MAKE AN ICE PACK  To make an ice pack, do one of the following:  Place crushed ice or a bag of frozen vegetables in a sealable plastic bag. Squeeze out the excess air. Place this bag inside another plastic bag. Slide the bag into a pillowcase or place a damp towel between your skin and the bag.  Mix 3 parts water with 1 part rubbing alcohol. Freeze the mixture in a sealable plastic bag. When you remove the mixture from the freezer, it will be slushy. Squeeze out the excess air. Place this bag inside another plastic bag. Slide the bag into a pillowcase or place a damp towel between your skin and the bag.   SEEK MEDICAL CARE IF:  You develop white spots on your skin. This may give the skin a blotchy (mottled) appearance.  Your skin turns blue or pale.  Your skin becomes waxy or hard.  Your swelling gets worse.  MAKE SURE YOU:  °Understand these instructions.  °Will watch your condition.  °Will get help right away if you are not doing well or get worse.  ° ° °Chronic Pain Discharge Instructions  °Emergency care providers appreciate that many patients coming to us are in severe pain and we wish to address their pain in the safest, most responsible manner.  It is important to recognize however, that the proper treatment of chronic pain differs from that of the pain of injuries and acute illnesses.  Our goal is to provide quality, safe, personalized care and we thank you for giving us the opportunity to serve you. °The use of narcotics and related agents for chronic pain syndromes may lead to additional physical and psychological  problems.  Nearly as many people die from prescription narcotics each year as die from car crashes.  Additionally, this risk is increased if such prescriptions are obtained from a variety of sources.  Therefore, only your primary care physician or a pain management specialist is able to safely treat such syndromes with narcotic medications long-term.   ° °Documentation revealing such prescriptions have been sought from multiple sources may prohibit us from providing a refill or different narcotic medication.  Your name may be checked first through the [place] Controlled Substances Reporting System.  This database is a record of controlled substance medication prescriptions that the patient has received.  This has been established by [employer] in an effort to eliminate the dangerous, and often life threatening, practice of obtaining multiple prescriptions from different medical providers.  ° °If you have a chronic pain syndrome (i.e. chronic headaches, recurrent back or neck pain, dental pain, abdominal or pelvis pain without a specific diagnosis, or neuropathic pain such as fibromyalgia) or recurrent visits for the same condition without an acute diagnosis, you may be treated with non-narcotics and other non-addictive medicines.  Allergic reactions or negative side effects that may be reported by a patient to such medications will not typically lead to the use of a narcotic analgesic or other controlled substance as an alternative. °  °Patients managing chronic pain with a personal physician should have provisions in place for breakthrough pain.  If you are in crisis, you should call your physician.  If your physician directs you to the emergency department, please have the doctor call and speak to our attending physician concerning your care. °  °When patients come to the Emergency Department (ED) with acute medical conditions in which the Emergency Department physician feels appropriate to prescribe  narcotic or sedating pain medication, the physician will prescribe these in very limited quantities.  The amount of these medications will last only until you can see your primary care physician in his/her office.  Any patient who returns to the ED seeking refills should expect only non-narcotic pain medications.  ° °In the event of an acute medical condition exists and the emergency physician feels it is necessary that the patient be given a narcotic or sedating medication -  a responsible adult driver should be present in the room prior to the medication being given by the nurse. °  °Prescriptions for narcotic or sedating medications that have been lost, stolen or expired will not be refilled in the Emergency Department.   ° °Patients who have chronic pain may receive non-narcotic prescriptions until seen by their primary care physician.  It is every patient’s personal responsibility to maintain active prescriptions with his or her primary care physician or specialist. ° ° ° °

## 2014-07-23 NOTE — ED Notes (Signed)
Patient states has been having neck and shoulder pain x 6 months.   [facility] that he wouldn't provide him with pain medicine.   Patient states chronic pain that should be relieved with surgery, but that he has a lot going on and hasn't scheduled.   Patient wants pain medicine today and states hasn't been taking anything at home.

## 2014-09-04 ENCOUNTER — Encounter (HOSPITAL_COMMUNITY): Payer: Self-pay | Admitting: Emergency Medicine

## 2014-09-04 ENCOUNTER — Emergency Department (HOSPITAL_COMMUNITY)
Admission: EM | Admit: 2014-09-04 | Discharge: 2014-09-05 | Disposition: A | Discharge status: Home / Self Care | Payer: Non-veteran care | Attending: Emergency Medicine | Admitting: Emergency Medicine

## 2014-09-04 DIAGNOSIS — F1090 Alcohol use, unspecified, uncomplicated: Secondary | ICD-10-CM | POA: Diagnosis present

## 2014-09-04 DIAGNOSIS — Z79899 Other long term (current) drug therapy: Secondary | ICD-10-CM | POA: Insufficient documentation

## 2014-09-04 DIAGNOSIS — F330 Major depressive disorder, recurrent, mild: Secondary | ICD-10-CM | POA: Diagnosis present

## 2014-09-04 DIAGNOSIS — E119 Type 2 diabetes mellitus without complications: Secondary | ICD-10-CM | POA: Insufficient documentation

## 2014-09-04 DIAGNOSIS — F1020 Alcohol dependence, uncomplicated: Secondary | ICD-10-CM | POA: Insufficient documentation

## 2014-09-04 DIAGNOSIS — Z720 Tobacco use: Secondary | ICD-10-CM | POA: Insufficient documentation

## 2014-09-04 DIAGNOSIS — Z791 Long term (current) use of non-steroidal anti-inflammatories (NSAID): Secondary | ICD-10-CM | POA: Insufficient documentation

## 2014-09-04 DIAGNOSIS — Z794 Long term (current) use of insulin: Secondary | ICD-10-CM | POA: Insufficient documentation

## 2014-09-04 DIAGNOSIS — G629 Polyneuropathy, unspecified: Secondary | ICD-10-CM | POA: Insufficient documentation

## 2014-09-04 LAB — COMPREHENSIVE METABOLIC PANEL
ALBUMIN: 4.1 g/dL (ref 3.5–5.0)
ALK PHOS: 52 U/L (ref 38–126)
ALT: 62 U/L (ref 17–63)
AST: 55 U/L — ABNORMAL HIGH (ref 15–41)
Anion gap: 13 (ref 5–15)
BUN: 17 mg/dL (ref 6–20)
CO2: 22 mmol/L (ref 22–32)
Calcium: 9.2 mg/dL (ref 8.9–10.3)
Chloride: 102 mmol/L (ref 101–111)
Creatinine, Ser: 0.97 mg/dL (ref 0.61–1.24)
GFR calc Af Amer: >60 mL/min (ref >60)
GFR calc non Af Amer: >60 mL/min (ref >60)
GLUCOSE: 352 mg/dL — AB (ref 65–99)
POTASSIUM: 3.6 mmol/L (ref 3.5–5.1)
SODIUM: 137 mmol/L (ref 135–145)
Total Bilirubin: 0.6 mg/dL (ref 0.3–1.2)
Total Protein: 7.7 g/dL (ref 6.5–8.1)

## 2014-09-04 LAB — SALICYLATE LEVEL

## 2014-09-04 LAB — ETHANOL: Alcohol, Ethyl (B): 56 mg/dL — ABNORMAL HIGH (ref <5)

## 2014-09-04 LAB — ACETAMINOPHEN LEVEL

## 2014-09-04 LAB — RAPID URINE DRUG SCREEN, HOSP PERFORMED
AMPHETAMINES: NOT DETECTED
BARBITURATES: NOT DETECTED
BENZODIAZEPINES: NOT DETECTED
COCAINE: POSITIVE — AB
Opiates: NOT DETECTED
Tetrahydrocannabinol: NOT DETECTED

## 2014-09-04 LAB — CBC
HEMATOCRIT: 41.7 % (ref 39.0–52.0)
HEMOGLOBIN: 14 g/dL (ref 13.0–17.0)
MCH: 28.4 pg (ref 26.0–34.0)
MCHC: 33.6 g/dL (ref 30.0–36.0)
MCV: 84.6 fL (ref 78.0–100.0)
Platelets: 230 10*3/uL (ref 150–400)
RBC: 4.93 MIL/uL (ref 4.22–5.81)
RDW: 13.1 % (ref 11.5–15.5)
WBC: 6.5 10*3/uL (ref 4.0–10.5)

## 2014-09-04 MED ORDER — GLIPIZIDE 5 MG PO TABS
5.0000 mg | ORAL_TABLET | Freq: Every day | ORAL | Status: DC
  Administered 2014-09-05: 5 mg via ORAL
  Filled 2014-09-04 (×2): qty 1

## 2014-09-04 MED ORDER — ZOLPIDEM TARTRATE 5 MG PO TABS: 5.0000 mg | ORAL_TABLET | Freq: Every evening | ORAL | Status: DC | PRN

## 2014-09-04 MED ORDER — TRAZODONE HCL 50 MG PO TABS
50.0000 mg | ORAL_TABLET | Freq: Every day | ORAL | Status: DC | PRN
  Administered 2014-09-05: 50 mg via ORAL
  Filled 2014-09-04: qty 1

## 2014-09-04 MED ORDER — NICOTINE 21 MG/24HR TD PT24
21.0000 mg | MEDICATED_PATCH | Freq: Every day | TRANSDERMAL | Status: DC
  Administered 2014-09-04 – 2014-09-05 (×2): 21 mg via TRANSDERMAL
  Filled 2014-09-04 (×2): qty 1

## 2014-09-04 MED ORDER — SERTRALINE HCL 50 MG PO TABS
50.0000 mg | ORAL_TABLET | Freq: Every day | ORAL | Status: DC
  Administered 2014-09-04 – 2014-09-05 (×2): 50 mg via ORAL
  Filled 2014-09-04 (×2): qty 1

## 2014-09-04 MED ORDER — INSULIN GLARGINE 100 UNIT/ML ~~LOC~~ SOLN
25.0000 [IU] | Freq: Every morning | SUBCUTANEOUS | Status: DC
  Administered 2014-09-05: 25 [IU] via SUBCUTANEOUS
  Filled 2014-09-04: qty 0.25

## 2014-09-04 MED ORDER — METFORMIN HCL 500 MG PO TABS
1000.0000 mg | ORAL_TABLET | Freq: Two times a day (BID) | ORAL | Status: DC
  Administered 2014-09-05: 1000 mg via ORAL
  Filled 2014-09-04 (×3): qty 2

## 2014-09-04 MED ORDER — IBUPROFEN 200 MG PO TABS
600.0000 mg | ORAL_TABLET | Freq: Three times a day (TID) | ORAL | Status: DC | PRN
  Filled 2014-09-04: qty 3

## 2014-09-04 MED ORDER — PANTOPRAZOLE SODIUM 40 MG PO TBEC
40.0000 mg | DELAYED_RELEASE_TABLET | Freq: Every day | ORAL | Status: DC
  Administered 2014-09-04 – 2014-09-05 (×2): 40 mg via ORAL
  Filled 2014-09-04 (×2): qty 1

## 2014-09-04 MED ORDER — DICLOFENAC POTASSIUM 50 MG PO TABS: 50.0000 mg | ORAL_TABLET | Freq: Three times a day (TID) | ORAL | Status: DC

## 2014-09-04 MED ORDER — DICLOFENAC SODIUM 75 MG PO TBEC
75.0000 mg | DELAYED_RELEASE_TABLET | Freq: Two times a day (BID) | ORAL | Status: DC
  Administered 2014-09-04 – 2014-09-05 (×2): 75 mg via ORAL
  Filled 2014-09-04 (×4): qty 1

## 2014-09-04 MED ORDER — ONDANSETRON HCL 4 MG PO TABS: 4.0000 mg | ORAL_TABLET | Freq: Three times a day (TID) | ORAL | Status: DC | PRN

## 2014-09-04 MED ORDER — MELOXICAM 7.5 MG PO TABS
7.5000 mg | ORAL_TABLET | Freq: Every day | ORAL | Status: DC
  Administered 2014-09-05: 7.5 mg via ORAL
  Filled 2014-09-04: qty 1

## 2014-09-04 NOTE — ED Notes (Signed)
TTS at the bedside. 

## 2014-09-04 NOTE — ED Notes (Addendum)
Pt arrived to the ED with [employer] under IVC paperwork.  Pt states that his brother is controlling and has taken out the paperwork to be controlling.  Paperwork states patient is not sleeping or eating or tending to personal hygiene.  Family states that pt is talking to self and seeing and hearing things.  All of there symptoms are denied by the patient.  Pt denies SI/HI

## 2014-09-04 NOTE — ED Provider Notes (Signed)
CSN: [id number]     Arrival date & time 09/04/14  8295 History  This chart was scribed for non-physician practitioner, Arman Filter, NP, working with Linwood Dibbles, MD, by Budd Palmer ED Scribe. This patient was seen in room WTR3/WLPT3 and the patient's care was started at 8:33 PM     Chief Complaint  Patient presents with  . Medical Clearance   The history is provided by the patient. No language interpreter was used.   HPI Comments: William Acevedo is a 59 y.o. male who presents to the Emergency Department under IVC for medical clearance. He states he does not know why he is here. He states he is here because of his brother, whom he lives with, who claims he has not been taking care of himself. Pt has a PMHx of depression for which he was prescribed medication by Dr Chestine Spore at the [name]. He last saw her in September of 2015. He goes there once a year. He states he is taking his medication on a regular basis. Pt denies SI.  Past Medical History  Diagnosis Date  . Diabetes mellitus without complication   . Neuropathy   . Depression   . Carpal tunnel syndrome    Past Surgical History  Procedure Laterality Date  . Testicle torsion reduction     History reviewed. No pertinent family history. Social History  Substance Use Topics  . Smoking status: Current Every Day Smoker -- 0.50 packs/day    Types: Cigarettes  . Smokeless tobacco: None  . Alcohol Use: No    Review of Systems  Allergies  Review of patient's allergies indicates no known allergies.  Home Medications   Prior to Admission medications   Medication Sig Start Date End Date Taking? Authorizing Provider  gabapentin (NEURONTIN) 100 MG capsule Take 1 capsule (100 mg total) by mouth 3 (three) times daily. 12/02/13  Yes Hannah Muthersbaugh, PA-C  glipiZIDE (GLUCOTROL) 5 MG tablet Take 5 mg by mouth daily before breakfast.   Yes Historical Provider, MD  insulin glargine (LANTUS) 100 UNIT/ML injection Inject 25 Units into the  skin 2 (two) times daily.    Yes Historical Provider, MD  meloxicam (MOBIC) 7.5 MG tablet Take 1 tablet (7.5 mg total) by mouth daily. 05/20/14  Yes Lonia Skinner Sofia, PA-C  metFORMIN (GLUCOPHAGE) 500 MG tablet Take 1,000 mg by mouth 2 (two) times daily with a meal.   Yes Historical Provider, MD  methocarbamol (ROBAXIN) 500 MG tablet Take 1 tablet (500 mg total) by mouth 2 (two) times daily. 07/23/14  Yes Heather Laisure, PA-C  omeprazole (PRILOSEC) 20 MG capsule Take 20 mg by mouth daily.   Yes Historical Provider, MD  sertraline (ZOLOFT) 50 MG tablet Take 50 mg by mouth daily.   Yes Historical Provider, MD  traZODone (DESYREL) 50 MG tablet Take 50 mg by mouth daily as needed for sleep.   Yes Historical Provider, MD   BP 128/72 mmHg  Pulse 74  Temp(Src) 98.2 F (36.8 C) (Oral)  Resp 16  SpO2 98% Physical Exam  ED Course  Procedures  DIAGNOSTIC STUDIES: Oxygen Saturation is 96% on RA, adequate by my interpretation.    COORDINATION OF CARE: 8:35 PM - Discussed plans to order diagnostic studies. Pt advised of plan for treatment and pt agrees.  Labs Review Labs Reviewed  COMPREHENSIVE METABOLIC PANEL - Abnormal; Notable for the following:    Glucose, Bld 352 (*)    AST 55 (*)    All other components within  normal limits  ETHANOL - Abnormal; Notable for the following:    Alcohol, Ethyl (B) 56 (*)    All other components within normal limits  ACETAMINOPHEN LEVEL - Abnormal; Notable for the following:    Acetaminophen (Tylenol), Serum <10 (*)    All other components within normal limits  URINE RAPID DRUG SCREEN, HOSP PERFORMED - Abnormal; Notable for the following:    Cocaine POSITIVE (*)    All other components within normal limits  CBG MONITORING, ED - Abnormal; Notable for the following:    Glucose-Capillary 331 (*)    All other components within normal limits  CBG MONITORING, ED - Abnormal; Notable for the following:    Glucose-Capillary 126 (*)    All other components within  normal limits  SALICYLATE LEVEL  CBC    Imaging Review No results found. I have personally reviewed and evaluated these images and lab results as part of my medical decision-making.   EKG Interpretation None      MDM   Final diagnoses:  MDD (major depressive disorder), recurrent episode, mild  Uncomplicated alcohol dependence    I personally performed the services described in this documentation, which was scribed in my presence. The recorded information has been reviewed and is accurate.  Earley Favor, NP 09/13/14 0159  Linwood Dibbles, MD 09/14/14 [phone]

## 2014-09-04 NOTE — BH Assessment (Addendum)
Tele Assessment Note   William Acevedo is an 59 y.o. male.  -Clinician talked with Sharen Hones, NP who placed TTS consult order.  Patient is on IVC placed by brother.  IVC papers allege that patient has not been taking medications, has made SI statements to family and has been talking to himself.  Patient says that he has no SI intention or plan.  He does say that he believes that he brother is trying to tell him what to do all the time.  He says he takes his medications "most of the time."  He says that he does not always take his diabetic medication as directed.  Patient goes on to say "when my brother has nothing to do I become his project."  Patient says that he does talk to himself at times.  He says he isolates himself because he is worried about an upcoming court date.  Patient says that he does not see or hear things that are not there.  Patient lives with his brother, who took out the IVC papers.  Patient admits to using ETOH and marijuana "when I don't have anything to do."  Patient will drink and use drugs more when he has no work to do.  Patient says that "I am functional with my use of ETOH."  Patient says that he goes to the [facility] in [place] and says he goes on Thursdays.  He does say that the last time he went to an inpatient facility was in 2003.    Clinician did call patient's brother.  He says that patient will deny that he is not taking care of himself. Brother said that patient made statement about "not wanting to live anymore" about 3-4 weeks ago.  Patient brother says that patient will go through this pattern of behavior of using illicit drugs instead of his prescribed medications.  [facility] [place] and [facility] care then do well on medications for awhile.    This clinician pointed out that the doctor may decide to rescind the IVC.  Brother said he hoped not and said the he thought his brother may harm himself.    -Clinician discussed  patient care with Hulan Fess, NP who recommends AM psych eval for the 1st Opinion to be completed.  Clinician discussed w/ Sharen Hones, NP who is in agreement with disposition.  Axis I: Substance Abuse Axis II: Deferred Axis III:  Past Medical History  Diagnosis Date  . Diabetes mellitus without complication   . Neuropathy   . Depression   . Carpal tunnel syndrome    Axis IV: housing problems, other psychosocial or environmental problems and problems related to legal system/crime Axis V: 51-60 moderate symptoms  Past Medical History:  Past Medical History  Diagnosis Date  . Diabetes mellitus without complication   . Neuropathy   . Depression   . Carpal tunnel syndrome     Past Surgical History  Procedure Laterality Date  . Testicle torsion reduction      Family History: History reviewed. No pertinent family history.  Social History:  reports that he has been smoking Cigarettes.  He has been smoking about 0.50 packs per day. He does not have any smokeless tobacco history on file. He reports that he does not drink alcohol or use illicit drugs.  Additional Social History:  Alcohol / Drug Use Pain Medications: See PTA medication list Prescriptions: See PTA medication list Over the Counter: See PTA  medication list History of alcohol / drug use?: Yes Substance #1 Name of Substance 1: ETOH 1 - Age of First Use: Teens 1 - Amount (size/oz): Varies according to how busy he is. 1 - Frequency: Patient says if he has idle time he may drink. 1 - Duration: On-going 1 - Last Use / Amount: Today (08/18) Substance #2 Name of Substance 2: Cocaine 2 - Age of First Use: unknown 2 - Amount (size/oz): Varies 2 - Frequency: May use more if he is drinking. 2 - Duration: On-going 2 - Last Use / Amount: yesterday  CIWA: CIWA-Ar BP: 135/70 mmHg Pulse Rate: 114 COWS:    PATIENT STRENGTHS: (choose at least two) Average or above average intelligence Capable of independent  living Communication skills  Allergies: No Known Allergies  Home Medications:  (Not in a hospital admission)  OB/GYN Status:  No LMP for male patient.  General Assessment Data Location of Assessment: [facility] TTS Assessment: In system Is this a Tele or Face-to-Face Assessment?: Face-to-Face Is this an Initial Assessment or a Re-assessment for this encounter?: Initial Assessment Marital status: Single Living Arrangements: Other relatives (Living with brother) Can pt return to current living arrangement?: Yes Admission Status: Involuntary Is patient capable of signing voluntary admission?: No Referral Source: Self/Family/Friend Insurance type: VA benefits     Crisis Care Plan Living Arrangements: Other relatives (Living with brother) [facility]iatrist[facility] [place] Name of Therapist: None  Education Status Is patient currently in school?: No  Risk to self with the past 6 months Suicidal Ideation: No Has patient been a risk to self within the past 6 months prior to admission? : No Suicidal Intent: No Has patient had any suicidal intent within the past 6 months prior to admission? : No Is patient at risk for suicide?: No Suicidal Plan?: No Has patient had any suicidal plan within the past 6 months prior to admission? : No Access to Means: No What has been your use of drugs/alcohol within the last 12 months?: ETOH & cocaine use Previous Attempts/Gestures: No How many times?: 0 Other Self Harm Risks: Denies Triggers for Past Attempts: None known Intentional Self Injurious Behavior: None Family Suicide History: No Recent stressful life event(s): Legal Issues (Court date for a felony charge) Persecutory voices/beliefs?: No Depression: No Depression Symptoms:  (Pt denies depressive symptoms.) Substance abuse history and/or treatment for substance abuse?: No Suicide prevention information given to non-admitted patients: Not applicable  Risk to Others  within the past 6 months Homicidal Ideation: No Does patient have any lifetime risk of violence toward others beyond the six months prior to admission? : No Thoughts of Harm to Others: No Current Homicidal Intent: No Current Homicidal Plan: No Access to Homicidal Means: No Identified Victim: No one History of harm to others?: No Assessment of Violence: None Noted Violent Behavior Description: Pt denies Does patient have access to weapons?: No Criminal Charges Pending?: Yes Describe Pending Criminal Charges: Pleas Patricia charge but pt would  not divulve Does patient have a court date: Yes Court Date: 10/02/14 Is patient on probation?: Yes  Psychosis Hallucinations: None noted Delusions: None noted  Mental Status Report Appearance/Hygiene: Unremarkable, In scrubs Eye Contact: Good Motor Activity: Freedom of movement, Unremarkable Speech: Logical/coherent Level of Consciousness: Alert Mood: Anxious, Apprehensive, Pleasant Affect: Anxious Anxiety Level: Moderate Thought Processes: Coherent, Relevant Judgement: Impaired Orientation: Person, Place, Time, Situation Obsessive Compulsive Thoughts/Behaviors: Minimal  Cognitive Functioning Concentration: Normal Memory: Remote Intact, Recent Impaired IQ: Average Insight: Good Impulse Control:  Fair Appetite: Good Weight Loss: 0 Weight Gain: 0 Sleep: Decreased Total Hours of Sleep:  (<6H/D) Vegetative Symptoms: None  ADLScreening [facility] Assessment Services) Patient's cognitive ability adequate to safely complete daily activities?: Yes Patient able to express need for assistance with ADLs?: Yes Independently performs ADLs?: Yes (appropriate for developmental age)  Prior Inpatient Therapy Prior Inpatient Therapy: Yes Prior Therapy Dates: November 2015 [facility] [place] Reason for Treatment: SA  Prior Outpatient Therapy Prior Outpatient Therapy: Yes Prior Therapy Dates: September '15 Prior Therapy  [facility] [place] Reason for Treatment: Med monitoring Does patient have an ACCT team?: No Does patient have Intensive In-House Services?  : No Does patient have [place] services? : No Does patient have P4CC services?: No  ADL Screening (condition at time of admission) Patient's cognitive ability adequate to safely complete daily activities?: Yes Is the patient deaf or have difficulty hearing?: No Does the patient have difficulty seeing, even when wearing glasses/contacts?: No Does the patient have difficulty concentrating, remembering, or making decisions?: No Patient able to express need for assistance with ADLs?: Yes Does the patient have difficulty dressing or bathing?: No Independently performs ADLs?: Yes (appropriate for developmental age) Does the patient have difficulty walking or climbing stairs?: No Weakness of Legs: None Weakness of Arms/Hands: None       Abuse/Neglect Assessment (Assessment to be complete while patient is alone) Physical Abuse: Denies Verbal Abuse: Denies Sexual Abuse: Denies Exploitation of patient/patient's resources: Denies Self-Neglect: Denies     Merchant navy officer (For Healthcare) Does patient have an advance directive?: No Would patient like information on creating an advanced directive?: No - patient declined information    Additional Information 1:1 In Past 12 Months?: No CIRT Risk: No Elopement Risk: No Does patient have medical clearance?: Yes     Disposition:  Disposition Initial Assessment Completed for this Encounter: Yes Disposition of Patient: Other dispositions Other disposition(s): Other (Comment) (Pt to be reviewed by NP)  [place]and[place] Lodge 09/04/2014 10:23 PM

## 2014-09-05 ENCOUNTER — Encounter (HOSPITAL_COMMUNITY): Payer: Self-pay | Admitting: Registered Nurse

## 2014-09-05 DIAGNOSIS — F1020 Alcohol dependence, uncomplicated: Secondary | ICD-10-CM | POA: Diagnosis not present

## 2014-09-05 DIAGNOSIS — F1090 Alcohol use, unspecified, uncomplicated: Secondary | ICD-10-CM | POA: Diagnosis present

## 2014-09-05 DIAGNOSIS — F330 Major depressive disorder, recurrent, mild: Secondary | ICD-10-CM | POA: Diagnosis not present

## 2014-09-05 LAB — CBG MONITORING, ED
GLUCOSE-CAPILLARY: 126 mg/dL — AB (ref 65–99)
Glucose-Capillary: 331 mg/dL — ABNORMAL HIGH (ref 65–99)

## 2014-09-05 MED ORDER — IBUPROFEN 200 MG PO TABS
600.0000 mg | ORAL_TABLET | Freq: Once | ORAL | Status: AC
  Administered 2014-09-05: 600 mg via ORAL

## 2014-09-05 NOTE — BH Assessment (Signed)
[facility] Assessment Progress Note  Per Thedore Mins, MD, this pt does not require psychiatric hospitalization at this time.  He is to be discharged from [facility] with instructions to follow up with the [facility] clinic, his current outpatient provider.  This recommendation has been included in pt's discharge instructions along with contact information.  Pt's nurse has been notified.  Doylene Canning, MA Triage Specialist [phone]

## 2014-09-05 NOTE — Discharge Summary (Signed)
Physician Discharge Summary Note  Patient:  William Acevedo is an 59 y.o., male MRN:  [id number] DOB:  [birth year] Patient phone:  [phone] (home)  Patient address:   [address] [place] [address],  Total Time spent with patient: 45 minutes  Date of Admission:  09/04/2014 Date of Discharge: 09/04/2014  Reason for Admission:  Patient states that he brother had concerns about him. Patient denies suicidal ideation and states that he doesn't know where his brother got that from. Patient states that he has outpatient services with the [facility] and he takes Zoloft and is compliant with his medications. States that he is homeless and depressed but is not going to kill him self. States "I like to stay to my self and my brother is a people person; I just don't like being around a lot of people. Yes I'm depressed; I'm homeless; I'm working on my benefits I get 800 dollars a month and that ain't enough to live off of." Patient denies suicidal ideation, history of suicide attempt, and self injurious behavior. Patient denies homicidal ideation, violent behavior, and access to guns. Patient also denies psychosis and paranoia.  Principal Problem: MDD (major depressive disorder), recurrent episode, mild Discharge Diagnoses: Patient Active Problem List   Diagnosis Date Noted  . MDD (major depressive disorder), recurrent episode, mild [F33.0]   . Uncomplicated alcohol dependence [F10.20]     Musculoskeletal: Strength & Muscle Tone: within normal limits Gait & Station: normal Patient leans: N/A  Psychiatric Specialty Exam:  See Suicide Risk Assessment Physical Exam  Review of Systems  Psychiatric/Behavioral: Negative for hallucinations. Depression: stable. Substance abuse: ETOH. The patient is not nervous/anxious.   All other systems reviewed and are negative.   Blood pressure 128/72, pulse 74, temperature 98.2 F (36.8 C), temperature source Oral, resp. rate 16, SpO2 98 %.There is no  weight on file to calculate BMI.  Has this patient used any form of tobacco in the last 30 days? (Cigarettes, Smokeless Tobacco, Cigars, and/or Pipes) Yes, A prescription for an FDA-approved tobacco cessation medication was offered at discharge and the patient refused  Past Medical History:  Past Medical History  Diagnosis Date  . Diabetes mellitus without complication   . Neuropathy   . Depression   . Carpal tunnel syndrome     Past Surgical History  Procedure Laterality Date  . Testicle torsion reduction     Family History: History reviewed. No pertinent family history. Social History:  History  Alcohol Use No     History  Drug Use No    Comment: 18  months clean    Social History   Social History  . Marital Status: Married    Spouse Name: N/A  . Number of Children: N/A  . Years of Education: N/A   Social History Main Topics  . Smoking status: Current Every Day Smoker -- 0.50 packs/day    Types: Cigarettes  . Smokeless tobacco: None  . Alcohol Use: No  . Drug Use: No     Comment: 18  months clean  . Sexual Activity: Not Asked   Other Topics Concern  . None   Social History Narrative    Risk to Self: Suicidal Ideation: No Suicidal Intent: No Is patient at risk for suicide?: No Suicidal Plan?: No Access to Means: No What has been your use of drugs/alcohol within the last 12 months?: ETOH & cocaine use How many times?: 0 Other Self Harm Risks: Denies Triggers for Past Attempts: None known Intentional Self  Injurious Behavior: None Risk to Others: Homicidal Ideation: No Thoughts of Harm to Others: No Current Homicidal Intent: No Current Homicidal Plan: No Access to Homicidal Means: No Identified Victim: No one History of harm to others?: No Assessment of Violence: None Noted Violent Behavior Description: Pt denies Does patient have access to weapons?: No Criminal Charges Pending?: Yes Describe Pending Criminal Charges: Felony charge but pt would  not  divulve Does patient have a court date: Yes Court Date: 10/02/14 Prior Inpatient Therapy: Prior Inpatient Therapy: Yes Prior Therapy Dates: November 2015 [facility] [place] Reason for Treatment: SA Prior Outpatient Therapy: Prior Outpatient Therapy: Yes Prior Therapy Dates: September '15 [facility] [place] Reason for Treatment: Med monitoring Does patient have an ACCT team?: No Does patient have Intensive In-House Services?  : No Does patient have [facility] services? : No Does patient have P4CC services?: No  Level of Care:  OP  Hospital Course:  TARVIS BLOSSOM was monitored overnight and treated for  MDD (major depressive disorder), recurrent episode, mild and crisis management while in the ED.  Hewas treated discharged with the medications listed below under Medication List.  Medical problems were identified and treated as needed.  Home medications were restarted as appropriate. He was offered further treatment options upon discharge including but not limited to Residential, Intensive Outpatient, and Outpatient treatment.  CLEVELAND YARBRO will follow up with the services as listed below under Follow Up Information.     Upon completion of this admission the patient was both mentally and medically stable for discharge denying suicidal/homicidal ideation, auditory/visual/tactile hallucinations, delusional thoughts and paranoia.      Consults:  psychiatry  Significant Diagnostic Studies:  labs: Reviewed  Discharge Vitals:   Blood pressure 128/72, pulse 74, temperature 98.2 F (36.8 C), temperature source Oral, resp. rate 16, SpO2 98 %. There is no weight on file to calculate BMI. Lab Results:   Results for orders placed or performed during the hospital encounter of 09/04/14 (from the past 72 hour(s))  Comprehensive metabolic panel     Status: Abnormal   Collection Time: 09/04/14  7:24 PM  Result Value  Ref Range   Sodium 137 135 - 145 mmol/L   Potassium 3.6 3.5 - 5.1 mmol/L   Chloride 102 101 - 111 mmol/L   CO2 22 22 - 32 mmol/L   Glucose, Bld 352 (H) 65 - 99 mg/dL   BUN 17 6 - 20 mg/dL   Creatinine, Ser 0.97 0.61 - 1.24 mg/dL   Calcium 9.2 8.9 - 10.3 mg/dL   Total Protein 7.7 6.5 - 8.1 g/dL   Albumin 4.1 3.5 - 5.0 g/dL   AST 55 (H) 15 - 41 U/L   ALT 62 17 - 63 U/L   Alkaline Phosphatase 52 38 - 126 U/L   Total Bilirubin 0.6 0.3 - 1.2 mg/dL   GFR calc non Af Amer >60 >60 mL/min   GFR calc Af Amer >60 >60 mL/min    Comment: (NOTE) The eGFR has been calculated using the CKD EPI equation. This calculation has not been validated in all clinical situations. eGFR's persistently <60 mL/min signify possible Chronic Kidney Disease.    Anion gap 13 5 - 15  CBC     Status: None   Collection Time: 09/04/14  7:24 PM  Result Value Ref Range   WBC 6.5 4.0 - 10.5 K/uL   RBC 4.93 4.22 - 5.81 MIL/uL   Hemoglobin 14.0 13.0 -  17.0 g/dL   HCT 41.7 39.0 - 52.0 %   MCV 84.6 78.0 - 100.0 fL   MCH 28.4 26.0 - 34.0 pg   MCHC 33.6 30.0 - 36.0 g/dL   RDW 13.1 11.5 - 15.5 %   Platelets 230 150 - 400 K/uL  Ethanol (ETOH)     Status: Abnormal   Collection Time: 09/04/14  7:25 PM  Result Value Ref Range   Alcohol, Ethyl (B) 56 (H) <5 mg/dL    Comment:        LOWEST DETECTABLE LIMIT FOR SERUM ALCOHOL IS 5 mg/dL FOR MEDICAL PURPOSES ONLY   Salicylate level     Status: None   Collection Time: 09/04/14  7:25 PM  Result Value Ref Range   Salicylate Lvl <8.0 2.8 - 30.0 mg/dL  Acetaminophen level     Status: Abnormal   Collection Time: 09/04/14  7:25 PM  Result Value Ref Range   Acetaminophen (Tylenol), Serum <10 (L) 10 - 30 ug/mL    Comment:        THERAPEUTIC CONCENTRATIONS VARY SIGNIFICANTLY. A RANGE OF 10-30 ug/mL MAY BE AN EFFECTIVE CONCENTRATION FOR MANY PATIENTS. HOWEVER, SOME ARE BEST TREATED AT CONCENTRATIONS OUTSIDE THIS RANGE. ACETAMINOPHEN CONCENTRATIONS >150 ug/mL AT 4 HOURS  AFTER INGESTION AND >50 ug/mL AT 12 HOURS AFTER INGESTION ARE OFTEN ASSOCIATED WITH TOXIC REACTIONS.   Urine rapid drug screen (hosp performed) (Not at [facility])     Status: Abnormal   Collection Time: 09/04/14  8:10 PM  Result Value Ref Range   Opiates NONE DETECTED NONE DETECTED   Cocaine POSITIVE (A) NONE DETECTED   Benzodiazepines NONE DETECTED NONE DETECTED   Amphetamines NONE DETECTED NONE DETECTED   Tetrahydrocannabinol NONE DETECTED NONE DETECTED   Barbiturates NONE DETECTED NONE DETECTED    Comment:        DRUG SCREEN FOR MEDICAL PURPOSES ONLY.  IF CONFIRMATION IS NEEDED FOR ANY PURPOSE, NOTIFY LAB WITHIN 5 DAYS.        LOWEST DETECTABLE LIMITS FOR URINE DRUG SCREEN Drug Class       Cutoff (ng/mL) Amphetamine      1000 Barbiturate      200 Benzodiazepine   223 Tricyclics       361 Opiates          300 Cocaine          300 THC              50   CBG monitoring, ED     Status: Abnormal   Collection Time: 09/05/14  7:30 AM  Result Value Ref Range   Glucose-Capillary 331 (H) 65 - 99 mg/dL  CBG monitoring, ED     Status: Abnormal   Collection Time: 09/05/14 11:48 AM  Result Value Ref Range   Glucose-Capillary 126 (H) 65 - 99 mg/dL    Physical Findings: AIMS:  , ,  ,  ,    CIWA:    COWS:      See Psychiatric Specialty Exam and Suicide Risk Assessment completed by Attending Physician prior to discharge.  Discharge destination:  Home  Is patient on multiple antipsychotic therapies at discharge:  No   Has Patient had three or more failed trials of antipsychotic monotherapy by history:  No    Recommended Plan for Multiple Antipsychotic Therapies: NA      Discharge Instructions    Diet - low sodium heart healthy    Complete by:  As directed      Diet Carb  Modified    Complete by:  As directed      Discharge instructions    Complete by:  As directed   Take all of you medications as prescribed by your mental healthcare provider.  Report any adverse effects  and reactions from your medications to your outpatient provider promptly. Do not engage in alcohol and or illegal drug use while on prescription medicines. In the event of worsening symptoms call the crisis hotline, 911, and or go to the nearest emergency department for appropriate evaluation and treatment of symptoms. Follow-up with your primary care provider for your medical issues, concerns and or health care needs.   Keep all scheduled appointments.  If you are unable to keep an appointment call to reschedule.  Let the nurse know if you will need medications before next scheduled appointment.     Increase activity slowly    Complete by:  As directed             Medication List    STOP taking these medications        diclofenac 50 MG tablet  Commonly known as:  CATAFLAM     HYDROcodone-acetaminophen 5-325 MG per tablet  Commonly known as:  NORCO/VICODIN      TAKE these medications      Indication   gabapentin 100 MG capsule  Commonly known as:  NEURONTIN  Take 1 capsule (100 mg total) by mouth 3 (three) times daily.      glipiZIDE 5 MG tablet  Commonly known as:  GLUCOTROL  Take 5 mg by mouth daily before breakfast.      insulin glargine 100 UNIT/ML injection  Commonly known as:  LANTUS  Inject 25 Units into the skin 2 (two) times daily.      meloxicam 7.5 MG tablet  Commonly known as:  MOBIC  Take 1 tablet (7.5 mg total) by mouth daily.      metFORMIN 500 MG tablet  Commonly known as:  GLUCOPHAGE  Take 1,000 mg by mouth 2 (two) times daily with a meal.      methocarbamol 500 MG tablet  Commonly known as:  ROBAXIN  Take 1 tablet (500 mg total) by mouth 2 (two) times daily.      omeprazole 20 MG capsule  Commonly known as:  PRILOSEC  Take 20 mg by mouth daily.      sertraline 50 MG tablet  Commonly known as:  ZOLOFT  Take 50 mg by mouth daily.      traZODone 50 MG tablet  Commonly known as:  DESYREL  Take 50 mg by mouth daily as needed for sleep.         Follow-up Information    Please follow up.   Why:  For your ongoing behavioral health needs, you are advised to continue treatment at the [facility]:   Contact information:     [facility]  [address]  [place], [place] [address]  [phone]      Follow-up recommendations:  Activity:  As tolerated Diet:  Low Carb/Sodium  Comments:   Patient has been instructed to take medications as prescribed; and report adverse effects to outpatient provider.  Follow up with primary doctor for any medical issues and If symptoms recur report to nearest emergency or crisis hot line.    Total Discharge Time: 45 minutes  Signed: Earleen Newport, FNP-BC 09/05/2014, 4:13 PM  Patient seen face-to-face for psychiatric evaluation, chart reviewed and case discussed  with the physician extender and developed treatment plan. Reviewed the information documented and agree with the treatment plan. Corena Pilgrim, MD

## 2014-09-05 NOTE — ED Notes (Signed)
Pt, transferred from ED to [facility] room 27. Pt is alert x 4, oriented  to TCU. Pt complained of Lt.shoulder pain 8/10, NP made aware who ordered ibuprofen for pt. Will continue to monitor pt.-------Gentle Hoge, rn

## 2014-09-05 NOTE — Discharge Instructions (Signed)
For your ongoing behavioral health needs, you are advised to continue treatment at the [facility]:       [facility]      [address]      [place], [place] [address]      [phone]

## 2014-09-05 NOTE — Consult Note (Signed)
[facility] Face-to-Face Psychiatry Consult   Reason for Consult:  Depression/Suicidal ideation Referring Physician:  WLED Patient Identification: William Acevedo MRN:  [id number] Principal Diagnosis: MDD (major depressive disorder), recurrent episode, mild and Uncomplicated Alcohol Dependence  Diagnosis:   Patient Active Problem List   Diagnosis Date Noted  . MDD (major depressive disorder), recurrent episode, mild [F33.0]   . Uncomplicated alcohol dependence [F10.20]     Total Time spent with patient: 45 minutes  Subjective:   William Acevedo is a 59 y.o. male patient present to [facility] under IVC by his brother stating that patient was suicidal and that he wasn't caring for himself  HPI:  Patient states that he brother had concerns about him.  Patient denies suicidal ideation and states that he doesn't know where his brother got that from.  Patient states that he has outpatient services with the [facility] and he takes Zoloft and is compliant with his medications.  States that he is homeless and depressed but is not going to kill him self.  States "I like to stay to my self and my brother is a people person; I just don't like being around a lot of people.  Yes I'm depressed; I'm homeless; I'm working on my benefits I get 800 dollars a month and that ain't enough to live off of." Patient denies suicidal ideation, history of suicide attempt, and self injurious behavior.  Patient denies homicidal ideation, violent behavior, and access to guns.  Patient also denies psychosis and paranoia.  HPI Elements:   Location:  Depression. Quality:  Alcohol abuse. Severity:  mild. Duration:  chronic.  Past Medical History:  Past Medical History  Diagnosis Date  . Diabetes mellitus without complication   . Neuropathy   . Depression   . Carpal tunnel syndrome     Past Surgical History  Procedure Laterality Date  . Testicle torsion reduction     Family History: History reviewed. No pertinent family  history. Social History:  History  Alcohol Use No     History  Drug Use No    Comment: 18  months clean    Social History   Social History  . Marital Status: Married    Spouse Name: N/A  . Number of Children: N/A  . Years of Education: N/A   Social History Main Topics  . Smoking status: Current Every Day Smoker -- 0.50 packs/day    Types: Cigarettes  . Smokeless tobacco: None  . Alcohol Use: No  . Drug Use: No     Comment: 18  months clean  . Sexual Activity: Not Asked   Other Topics Concern  . None   Social History Narrative   Additional Social History:    Pain Medications: See PTA medication list Prescriptions: See PTA medication list Over the Counter: See PTA medication list History of alcohol / drug use?: Yes Name of Substance 1: ETOH 1 - Age of First Use: Teens 1 - Amount (size/oz): Varies according to how busy he is. 1 - Frequency: Patient says if he has idle time he may drink. 1 - Duration: On-going 1 - Last Use / Amount: Today (08/18) Name of Substance 2: Cocaine 2 - Age of First Use: unknown 2 - Amount (size/oz): Varies 2 - Frequency: May use more if he is drinking. 2 - Duration: On-going 2 - Last Use / Amount: yesterday   Allergies:  No Known Allergies  Labs:  Results for orders placed or performed during the hospital encounter of 09/04/14 (from  the past 48 hour(s))  Comprehensive metabolic panel     Status: Abnormal   Collection Time: 09/04/14  7:24 PM  Result Value Ref Range   Sodium 137 135 - 145 mmol/L   Potassium 3.6 3.5 - 5.1 mmol/L   Chloride 102 101 - 111 mmol/L   CO2 22 22 - 32 mmol/L   Glucose, Bld 352 (H) 65 - 99 mg/dL   BUN 17 6 - 20 mg/dL   Creatinine, Ser 0.97 0.61 - 1.24 mg/dL   Calcium 9.2 8.9 - 10.3 mg/dL   Total Protein 7.7 6.5 - 8.1 g/dL   Albumin 4.1 3.5 - 5.0 g/dL   AST 55 (H) 15 - 41 U/L   ALT 62 17 - 63 U/L   Alkaline Phosphatase 52 38 - 126 U/L   Total Bilirubin 0.6 0.3 - 1.2 mg/dL   GFR calc non Af Amer >60 >60  mL/min   GFR calc Af Amer >60 >60 mL/min    Comment: (NOTE) The eGFR has been calculated using the CKD EPI equation. This calculation has not been validated in all clinical situations. eGFR's persistently <60 mL/min signify possible Chronic Kidney Disease.    Anion gap 13 5 - 15  CBC     Status: None   Collection Time: 09/04/14  7:24 PM  Result Value Ref Range   WBC 6.5 4.0 - 10.5 K/uL   RBC 4.93 4.22 - 5.81 MIL/uL   Hemoglobin 14.0 13.0 - 17.0 g/dL   HCT 41.7 39.0 - 52.0 %   MCV 84.6 78.0 - 100.0 fL   MCH 28.4 26.0 - 34.0 pg   MCHC 33.6 30.0 - 36.0 g/dL   RDW 13.1 11.5 - 15.5 %   Platelets 230 150 - 400 K/uL  Ethanol (ETOH)     Status: Abnormal   Collection Time: 09/04/14  7:25 PM  Result Value Ref Range   Alcohol, Ethyl (B) 56 (H) <5 mg/dL    Comment:        LOWEST DETECTABLE LIMIT FOR SERUM ALCOHOL IS 5 mg/dL FOR MEDICAL PURPOSES ONLY   Salicylate level     Status: None   Collection Time: 09/04/14  7:25 PM  Result Value Ref Range   Salicylate Lvl <9.9 2.8 - 30.0 mg/dL  Acetaminophen level     Status: Abnormal   Collection Time: 09/04/14  7:25 PM  Result Value Ref Range   Acetaminophen (Tylenol), Serum <10 (L) 10 - 30 ug/mL    Comment:        THERAPEUTIC CONCENTRATIONS VARY SIGNIFICANTLY. A RANGE OF 10-30 ug/mL MAY BE AN EFFECTIVE CONCENTRATION FOR MANY PATIENTS. HOWEVER, SOME ARE BEST TREATED AT CONCENTRATIONS OUTSIDE THIS RANGE. ACETAMINOPHEN CONCENTRATIONS >150 ug/mL AT 4 HOURS AFTER INGESTION AND >50 ug/mL AT 12 HOURS AFTER INGESTION ARE OFTEN ASSOCIATED WITH TOXIC REACTIONS.   Urine rapid drug screen (hosp performed) (Not at [facility])     Status: Abnormal   Collection Time: 09/04/14  8:10 PM  Result Value Ref Range   Opiates NONE DETECTED NONE DETECTED   Cocaine POSITIVE (A) NONE DETECTED   Benzodiazepines NONE DETECTED NONE DETECTED   Amphetamines NONE DETECTED NONE DETECTED   Tetrahydrocannabinol NONE DETECTED NONE DETECTED   Barbiturates NONE  DETECTED NONE DETECTED    Comment:        DRUG SCREEN FOR MEDICAL PURPOSES ONLY.  IF CONFIRMATION IS NEEDED FOR ANY PURPOSE, NOTIFY LAB WITHIN 5 DAYS.        LOWEST DETECTABLE LIMITS FOR URINE DRUG SCREEN  Drug Class       Cutoff (ng/mL) Amphetamine      1000 Barbiturate      200 Benzodiazepine   939 Tricyclics       030 Opiates          300 Cocaine          300 THC              50   CBG monitoring, ED     Status: Abnormal   Collection Time: 09/05/14  7:30 AM  Result Value Ref Range   Glucose-Capillary 331 (H) 65 - 99 mg/dL  CBG monitoring, ED     Status: Abnormal   Collection Time: 09/05/14 11:48 AM  Result Value Ref Range   Glucose-Capillary 126 (H) 65 - 99 mg/dL    Vitals: Blood pressure 128/72, pulse 74, temperature 98.2 F (36.8 C), temperature source Oral, resp. rate 16, SpO2 98 %.  Risk to Self: Suicidal Ideation: No Suicidal Intent: No Is patient at risk for suicide?: No Suicidal Plan?: No Access to Means: No What has been your use of drugs/alcohol within the last 12 months?: ETOH & cocaine use How many times?: 0 Other Self Harm Risks: Denies Triggers for Past Attempts: None known Intentional Self Injurious Behavior: None Risk to Others: Homicidal Ideation: No Thoughts of Harm to Others: No Current Homicidal Intent: No Current Homicidal Plan: No Access to Homicidal Means: No Identified Victim: No one History of harm to others?: No Assessment of Violence: None Noted Violent Behavior Description: Pt denies Does patient have access to weapons?: No Criminal Charges Pending?: Yes Describe Pending Criminal Charges: Felony charge but pt would  not divulve Does patient have a court date: Yes Court Date: 10/02/14 Prior Inpatient Therapy: Prior Inpatient Therapy: Yes Prior Therapy Dates: November 2015 [facility] [place] Reason for Treatment: SA Prior Outpatient Therapy: Prior Outpatient Therapy: Yes Prior Therapy Dates: September  '15 Prior [facility]ilty[facility]Provider[facility] [place] Reason for Treatment: Med monitoring Does patient have an ACCT team?: No Does patient have Intensive In-House Services?  : No Does patient have [place] services? : No Does patient have P4CC services?: No  Current Facility-Administered Medications  Medication Dose Route Frequency Provider Last Rate Last Dose  . diclofenac (VOLTAREN) EC tablet 75 mg  75 mg Oral BID Dorie Rank, MD   75 mg at 09/05/14 0747  . glipiZIDE (GLUCOTROL) tablet 5 mg  5 mg Oral QAC breakfast Junius Creamer, NP   5 mg at 09/05/14 0747  . ibuprofen (ADVIL,MOTRIN) tablet 600 mg  600 mg Oral Q8H PRN Junius Creamer, NP      . insulin glargine (LANTUS) injection 25 Units  25 Units Subcutaneous q morning - 10a Junius Creamer, NP   25 Units at 09/05/14 0748  . meloxicam (MOBIC) tablet 7.5 mg  7.5 mg Oral Daily Junius Creamer, NP   7.5 mg at 09/05/14 0747  . metFORMIN (GLUCOPHAGE) tablet 1,000 mg  1,000 mg Oral BID WC Junius Creamer, NP   1,000 mg at 09/05/14 0747  . nicotine (NICODERM CQ - dosed in mg/24 hours) patch 21 mg  21 mg Transdermal Daily Junius Creamer, NP   21 mg at 09/05/14 0749  . ondansetron (ZOFRAN) tablet 4 mg  4 mg Oral Q8H PRN Junius Creamer, NP      . pantoprazole (PROTONIX) EC tablet 40 mg  40 mg Oral Daily Junius Creamer, NP   40 mg at 09/05/14 0747  . sertraline (ZOLOFT)  tablet 50 mg  50 mg Oral Daily Junius Creamer, NP   50 mg at 09/05/14 0746  . traZODone (DESYREL) tablet 50 mg  50 mg Oral Daily PRN Junius Creamer, NP   50 mg at 09/05/14 0142  . zolpidem (AMBIEN) tablet 5 mg  5 mg Oral QHS PRN Junius Creamer, NP       Current Outpatient Prescriptions  Medication Sig Dispense Refill  . diclofenac (CATAFLAM) 50 MG tablet Take 1 tablet (50 mg total) by mouth 3 (three) times daily. 21 tablet 0  . gabapentin (NEURONTIN) 100 MG capsule Take 1 capsule (100 mg total) by mouth 3 (three) times daily. 60 capsule 0  . glipiZIDE (GLUCOTROL) 5 MG tablet Take 5 mg by mouth daily  before breakfast.    . HYDROcodone-acetaminophen (NORCO/VICODIN) 5-325 MG per tablet Take 1-2 tablets by mouth every 6 (six) hours as needed. 15 tablet 0  . insulin glargine (LANTUS) 100 UNIT/ML injection Inject 25 Units into the skin 2 (two) times daily.     . meloxicam (MOBIC) 7.5 MG tablet Take 1 tablet (7.5 mg total) by mouth daily. 15 tablet 0  . metFORMIN (GLUCOPHAGE) 500 MG tablet Take 1,000 mg by mouth 2 (two) times daily with a meal.    . methocarbamol (ROBAXIN) 500 MG tablet Take 1 tablet (500 mg total) by mouth 2 (two) times daily. 20 tablet 0  . omeprazole (PRILOSEC) 20 MG capsule Take 20 mg by mouth daily.    . sertraline (ZOLOFT) 50 MG tablet Take 50 mg by mouth daily.    . traZODone (DESYREL) 50 MG tablet Take 50 mg by mouth daily as needed for sleep.      Musculoskeletal: Strength & Muscle Tone: within normal limits Gait & Station: normal Patient leans: N/A  Psychiatric Specialty Exam: Physical Exam  Nursing note and vitals reviewed. Constitutional: He is oriented to person, place, and time.  Neck: Normal range of motion.  Respiratory: Effort normal.  Musculoskeletal: Normal range of motion.  Neurological: He is alert and oriented to person, place, and time.    Review of Systems  Psychiatric/Behavioral: Depression: Stable. Suicidal ideas: Denies. Hallucinations: Denies. Nervous/anxious: denies.     Blood pressure 128/72, pulse 74, temperature 98.2 F (36.8 C), temperature source Oral, resp. rate 16, SpO2 98 %.There is no weight on file to calculate BMI.  General Appearance: Casual  Eye Contact::  Good  Speech:  Clear and Coherent and Normal Rate  Volume:  Normal  Mood:  Depressed  Affect:  Congruent  Thought Process:  Circumstantial, Coherent and Goal Directed  Orientation:  Full (Time, Place, and Person)  Thought Content:  denies hallucinations, delusions, and paranoia  Suicidal Thoughts:  No  Homicidal Thoughts:  No  Memory:  Immediate;   Good Recent;    Good Remote;   Good  Judgement:  Fair  Insight:  Present  Psychomotor Activity:  Normal  Concentration:  Fair  Recall:  Good  Fund of Knowledge:Good  Language: Good  Akathisia:  No  Handed:  Right  AIMS (if indicated):     Assets:  Communication Skills Desire for Improvement Housing Social Support  ADL's:  Intact  Cognition: WNL  Sleep:      Medical Decision Making: Established Problem, Stable/Improving (1), Review of Psycho-Social Stressors (1), Review or order clinical lab tests (1) and Review of Medication Regimen & Side Effects (2)  Treatment Plan Summary: Plan Discharge home [facility]  Plan:  No evidence of imminent risk  to self or others at present.   Patient does not meet criteria for psychiatric inpatient admission.   Disposition: Discharge home with resources and [facility]  Rankin, Shuvon, FNP-BC 09/05/2014 4:03 PM Patient seen face-to-face for psychiatric evaluation, chart reviewed and case discussed with the physician extender and developed treatment plan. Reviewed the information documented and agree with the treatment plan. Corena Pilgrim, MD

## 2014-10-14 NOTE — ED Provider Notes (Signed)
CSN: [id number]  Arrival date & time 09/04/14  7829 History   First MD Initiated Contact with Patient 09/04/14 2028     Chief Complaint  Patient presents with  . Medical Clearance     (Consider location/radiation/quality/duration/timing/severity/associated sxs/prior Treatment) HPI Comments: IVC by brother for depression and inability to care for self    The history is provided by the patient and the EMS personnel.    Past Medical History  Diagnosis Date  . Diabetes mellitus without complication   . Neuropathy   . Depression   . Carpal tunnel syndrome    Past Surgical History  Procedure Laterality Date  . Testicle torsion reduction     History reviewed. No pertinent family history. Social History  Substance Use Topics  . Smoking status: Current Every Day Smoker -- 0.50 packs/day    Types: Cigarettes  . Smokeless tobacco: None  . Alcohol Use: No    Review of Systems  Constitutional: Negative for fever.  Respiratory: Negative for shortness of breath.   Cardiovascular: Negative for chest pain.  Neurological: Negative for dizziness and headaches.  Psychiatric/Behavioral: Positive for sleep disturbance. Negative for suicidal ideas.  All other systems reviewed and are negative.     Allergies  Review of patient's allergies indicates no known allergies.  Home Medications   Prior to Admission medications   Medication Sig Start Date End Date Taking? Authorizing Provider  gabapentin (NEURONTIN) 100 MG capsule Take 1 capsule (100 mg total) by mouth 3 (three) times daily. 12/02/13  Yes Hannah Muthersbaugh, PA-C  glipiZIDE (GLUCOTROL) 5 MG tablet Take 5 mg by mouth daily before breakfast.   Yes Historical Provider, MD  insulin glargine (LANTUS) 100 UNIT/ML injection Inject 25 Units into the skin 2 (two) times daily.    Yes Historical Provider, MD  meloxicam (MOBIC) 7.5 MG tablet Take 1 tablet (7.5 mg total) by mouth daily. 05/20/14  Yes Lonia Skinner Sofia, PA-C  metFORMIN  (GLUCOPHAGE) 500 MG tablet Take 1,000 mg by mouth 2 (two) times daily with a meal.   Yes Historical Provider, MD  methocarbamol (ROBAXIN) 500 MG tablet Take 1 tablet (500 mg total) by mouth 2 (two) times daily. 07/23/14  Yes Heather Laisure, PA-C  omeprazole (PRILOSEC) 20 MG capsule Take 20 mg by mouth daily.   Yes Historical Provider, MD  sertraline (ZOLOFT) 50 MG tablet Take 50 mg by mouth daily.   Yes Historical Provider, MD  traZODone (DESYREL) 50 MG tablet Take 50 mg by mouth daily as needed for sleep.   Yes Historical Provider, MD   BP 128/72 mmHg  Pulse 74  Temp(Src) 98.2 F (36.8 C) (Oral)  Resp 16  SpO2 98% Physical Exam  Constitutional: He appears well-developed and well-nourished.  HENT:  Head: Normocephalic.  Eyes: Pupils are equal, round, and reactive to light.  Neck: Normal range of motion.  Musculoskeletal: Normal range of motion.  Psychiatric: He exhibits a depressed mood.  Nursing note and vitals reviewed.   ED Course  Procedures (including critical care time) Labs Review Labs Reviewed  COMPREHENSIVE METABOLIC PANEL - Abnormal; Notable for the following:    Glucose, Bld 352 (*)    AST 55 (*)    All other components within normal limits  ETHANOL - Abnormal; Notable for the following:    Alcohol, Ethyl (B) 56 (*)    All other components within normal limits  ACETAMINOPHEN LEVEL - Abnormal; Notable for the following:    Acetaminophen (Tylenol), Serum <10 (*)  All other components within normal limits  URINE RAPID DRUG SCREEN, HOSP PERFORMED - Abnormal; Notable for the following:    Cocaine POSITIVE (*)    All other components within normal limits  CBG MONITORING, ED - Abnormal; Notable for the following:    Glucose-Capillary 331 (*)    All other components within normal limits  CBG MONITORING, ED - Abnormal; Notable for the following:    Glucose-Capillary 126 (*)    All other components within normal limits  SALICYLATE LEVEL  CBC    Imaging Review No  results found. I have personally reviewed and evaluated these images and lab results as part of my medical decision-making.   EKG Interpretation None      MDM   Final diagnoses:  MDD (major depressive disorder), recurrent episode, mild  Uncomplicated alcohol dependence         Earley Favor, NP 10/14/14 1610  Linwood Dibbles, MD 10/16/14 [phone]

## 2015-07-05 ENCOUNTER — Observation Stay (HOSPITAL_COMMUNITY)
Admission: EM | Admit: 2015-07-05 | Discharge: 2015-07-06 | Disposition: A | Discharge status: Home / Self Care | Payer: Non-veteran care | Attending: Internal Medicine | Admitting: Internal Medicine

## 2015-07-05 ENCOUNTER — Emergency Department (HOSPITAL_COMMUNITY): Payer: Non-veteran care

## 2015-07-05 ENCOUNTER — Other Ambulatory Visit: Payer: Self-pay

## 2015-07-05 ENCOUNTER — Encounter (HOSPITAL_COMMUNITY): Payer: Self-pay | Admitting: Emergency Medicine

## 2015-07-05 DIAGNOSIS — F1110 Opioid abuse, uncomplicated: Secondary | ICD-10-CM | POA: Diagnosis not present

## 2015-07-05 DIAGNOSIS — E860 Dehydration: Principal | ICD-10-CM | POA: Insufficient documentation

## 2015-07-05 DIAGNOSIS — N289 Disorder of kidney and ureter, unspecified: Secondary | ICD-10-CM

## 2015-07-05 DIAGNOSIS — E119 Type 2 diabetes mellitus without complications: Secondary | ICD-10-CM

## 2015-07-05 DIAGNOSIS — Z7984 Long term (current) use of oral hypoglycemic drugs: Secondary | ICD-10-CM | POA: Diagnosis not present

## 2015-07-05 DIAGNOSIS — F1410 Cocaine abuse, uncomplicated: Secondary | ICD-10-CM | POA: Insufficient documentation

## 2015-07-05 DIAGNOSIS — F17210 Nicotine dependence, cigarettes, uncomplicated: Secondary | ICD-10-CM | POA: Insufficient documentation

## 2015-07-05 DIAGNOSIS — F1910 Other psychoactive substance abuse, uncomplicated: Secondary | ICD-10-CM | POA: Diagnosis present

## 2015-07-05 DIAGNOSIS — Z794 Long term (current) use of insulin: Secondary | ICD-10-CM | POA: Insufficient documentation

## 2015-07-05 DIAGNOSIS — F1310 Sedative, hypnotic or anxiolytic abuse, uncomplicated: Secondary | ICD-10-CM | POA: Diagnosis not present

## 2015-07-05 DIAGNOSIS — E1140 Type 2 diabetes mellitus with diabetic neuropathy, unspecified: Secondary | ICD-10-CM | POA: Diagnosis not present

## 2015-07-05 DIAGNOSIS — R55 Syncope and collapse: Secondary | ICD-10-CM | POA: Diagnosis present

## 2015-07-05 DIAGNOSIS — T50901A Poisoning by unspecified drugs, medicaments and biological substances, accidental (unintentional), initial encounter: Secondary | ICD-10-CM | POA: Diagnosis present

## 2015-07-05 DIAGNOSIS — N179 Acute kidney failure, unspecified: Secondary | ICD-10-CM | POA: Diagnosis present

## 2015-07-05 LAB — CBC WITH DIFFERENTIAL/PLATELET
BASOS ABS: 0 10*3/uL (ref 0.0–0.1)
BASOS PCT: 0 %
EOS PCT: 0 %
Eosinophils Absolute: 0 10*3/uL (ref 0.0–0.7)
HCT: 43.7 % (ref 39.0–52.0)
Hemoglobin: 14.5 g/dL (ref 13.0–17.0)
Lymphocytes Relative: 19 %
Lymphs Abs: 2.2 10*3/uL (ref 0.7–4.0)
MCH: 27.9 pg (ref 26.0–34.0)
MCHC: 33.2 g/dL (ref 30.0–36.0)
MCV: 84.2 fL (ref 78.0–100.0)
MONO ABS: 0.8 10*3/uL (ref 0.1–1.0)
Monocytes Relative: 7 %
Neutro Abs: 8.8 10*3/uL — ABNORMAL HIGH (ref 1.7–7.7)
Neutrophils Relative %: 74 %
PLATELETS: 255 10*3/uL (ref 150–400)
RBC: 5.19 MIL/uL (ref 4.22–5.81)
RDW: 13.1 % (ref 11.5–15.5)
WBC: 11.8 10*3/uL — ABNORMAL HIGH (ref 4.0–10.5)

## 2015-07-05 LAB — COMPREHENSIVE METABOLIC PANEL
ALBUMIN: 4.6 g/dL (ref 3.5–5.0)
ALT: 31 U/L (ref 17–63)
ANION GAP: 16 — AB (ref 5–15)
AST: 39 U/L (ref 15–41)
Alkaline Phosphatase: 70 U/L (ref 38–126)
BUN: 33 mg/dL — AB (ref 6–20)
CHLORIDE: 97 mmol/L — AB (ref 101–111)
CO2: 23 mmol/L (ref 22–32)
Calcium: 10.1 mg/dL (ref 8.9–10.3)
Creatinine, Ser: 1.99 mg/dL — ABNORMAL HIGH (ref 0.61–1.24)
GFR calc Af Amer: 40 mL/min — ABNORMAL LOW (ref >60)
GFR, EST NON AFRICAN AMERICAN: 34 mL/min — AB (ref >60)
GLUCOSE: 250 mg/dL — AB (ref 65–99)
POTASSIUM: 4.2 mmol/L (ref 3.5–5.1)
Sodium: 136 mmol/L (ref 135–145)
Total Bilirubin: 0.9 mg/dL (ref 0.3–1.2)
Total Protein: 8.9 g/dL — ABNORMAL HIGH (ref 6.5–8.1)

## 2015-07-05 LAB — I-STAT TROPONIN, ED: TROPONIN I, POC: 0.02 ng/mL (ref 0.00–0.08)

## 2015-07-05 LAB — ETHANOL

## 2015-07-05 LAB — CK: CK TOTAL: 465 U/L — AB (ref 49–397)

## 2015-07-05 MED ORDER — SODIUM CHLORIDE 0.9 % IV BOLUS (SEPSIS)
1000.0000 mL | Freq: Once | INTRAVENOUS | Status: AC
  Administered 2015-07-05: 1000 mL via INTRAVENOUS

## 2015-07-05 MED ORDER — SODIUM CHLORIDE 0.9 % IV SOLN: Freq: Once | INTRAVENOUS | Status: AC

## 2015-07-05 NOTE — ED Provider Notes (Signed)
CSN: [id number]     Arrival date & time 07/05/15  2004 History   First MD Initiated Contact with Patient 07/05/15 2005     Chief Complaint  Patient presents with  . Loss of Consciousness     (Consider location/radiation/quality/duration/timing/severity/associated sxs/prior Treatment) HPI William Acevedo is a 60 y.o. male with history of diabetes, chronic pain, depression, presents to emergency department after found [place] [address] unconscious. Patient states he was running away from someone, states he was running down the highway, when he began to feel bad. He states that he had to stop and stand there for over an hour. He states that he does not remember sitting down, states he was just standing to "get myself together." He then does not believe what happened afterwards. Patient admits to drinking 2 beers earlier today. He admits to taking Dilaudid for pain which he states is prescribed to him. He states that he believes his symptoms are due to insomnia. He states he has not been to sleep in 7 days. He states he takes Seroquel and trazodone to help him sleep but it has not been helping. Patient denies any chest pain or shortness of breath at this time.  Past Medical History  Diagnosis Date  . Diabetes mellitus without complication (HCC)   . Neuropathy (HCC)   . Depression   . Carpal tunnel syndrome    Past Surgical History  Procedure Laterality Date  . Testicle torsion reduction     History reviewed. No pertinent family history. Social History  Substance Use Topics  . Smoking status: Current Every Day Smoker -- 0.50 packs/day    Types: Cigarettes  . Smokeless tobacco: None  . Alcohol Use: No    Review of Systems  Constitutional: Negative for fever and chills.  Respiratory: Negative for cough, chest tightness and shortness of breath.   Cardiovascular: Negative for chest pain, palpitations and leg swelling.  Gastrointestinal: Negative for nausea, vomiting, abdominal  pain, diarrhea and abdominal distention.  Genitourinary: Negative for dysuria, urgency, frequency and hematuria.  Musculoskeletal: Negative for myalgias, arthralgias, neck pain and neck stiffness.  Skin: Negative for rash.  Allergic/Immunologic: Negative for immunocompromised state.  Neurological: Positive for syncope. Negative for dizziness, weakness, light-headedness, numbness and headaches.  All other systems reviewed and are negative.     Allergies  Review of patient's allergies indicates no known allergies.  Home Medications   Prior to Admission medications   Medication Sig Start Date End Date Taking? Authorizing Provider  gabapentin (NEURONTIN) 100 MG capsule Take 1 capsule (100 mg total) by mouth 3 (three) times daily. 12/02/13   Hannah Muthersbaugh, PA-C  glipiZIDE (GLUCOTROL) 5 MG tablet Take 5 mg by mouth daily before breakfast.    Historical Provider, MD  insulin glargine (LANTUS) 100 UNIT/ML injection Inject 25 Units into the skin 2 (two) times daily.     Historical Provider, MD  meloxicam (MOBIC) 7.5 MG tablet Take 1 tablet (7.5 mg total) by mouth daily. 05/20/14   Elson AreasLeslie K Sofia, PA-C  metFORMIN (GLUCOPHAGE) 500 MG tablet Take 1,000 mg by mouth 2 (two) times daily with a meal.    Historical Provider, MD  methocarbamol (ROBAXIN) 500 MG tablet Take 1 tablet (500 mg total) by mouth 2 (two) times daily. 07/23/14   Heather Laisure, PA-C  omeprazole (PRILOSEC) 20 MG capsule Take 20 mg by mouth daily.    Historical Provider, MD  sertraline (ZOLOFT) 50 MG tablet Take 50 mg by mouth daily.  Historical Provider, MD  traZODone (DESYREL) 50 MG tablet Take 50 mg by mouth daily as needed for sleep.    Historical Provider, MD   BP 98/61 mmHg  Pulse 95  SpO2 94% Physical Exam  Constitutional: He is oriented to person, place, and time. He appears well-developed and well-nourished.  Appears intoxicated  HENT:  Head: Normocephalic and atraumatic.  Eyes: Conjunctivae and EOM are normal.   Pupils pinpoint reactive to light  Neck: Normal range of motion. Neck supple.  Cardiovascular: Normal rate, regular rhythm and normal heart sounds.   Pulmonary/Chest: Effort normal. No respiratory distress. He has no wheezes. He has no rales.  Abdominal: Soft. Bowel sounds are normal. He exhibits no distension. There is no tenderness. There is no rebound.  Musculoskeletal: He exhibits no edema.  Neurological: He is alert and oriented to person, place, and time.  5/5 and equal upper and lower extremity strength bilaterally. Equal grip strength bilaterally. Normal finger to nose and heel to shin. No pronator drift. Patellar reflexes 2+   Skin: Skin is warm and dry.  Nursing note and vitals reviewed.   ED Course  Procedures (including critical care time) Labs Review Labs Reviewed  CBC WITH DIFFERENTIAL/PLATELET  COMPREHENSIVE METABOLIC PANEL  ETHANOL  URINALYSIS, ROUTINE W REFLEX MICROSCOPIC (NOT AT [facility])  URINE RAPID DRUG SCREEN, HOSP PERFORMED  CK  I-STAT TROPOININ, ED    Imaging Review No results found. I have personally reviewed and evaluated these images and lab results as part of my medical decision-making.   EKG Interpretation None      MDM   Final diagnoses:  Dehydration  Polysubstance abuse  Renal insufficiency   Patient is an emergency department with possible syncopal episode while running outside down the highway. Patient appears to be intoxicated. His pupils are pinpoint. He is able to communicate, however appears to be somnolent and slow to respond. We will get labs, EKG, will monitor.  Creatinine is 1.99. Glucose 250. Anion gap 16. Attempted to do orthostatics, pt unable to stand up. BP down into 70s upon standing. Will continue to hydrate.   12:11 AM Discussed with hospitalist, Dr. Julian Reil, will repeat orthostatics.  Pt will be admitted to hospitalist service.   Filed Vitals:   07/06/15 0030 07/06/15 0147 07/06/15 0623 07/06/15 0801  BP: 153/82   102/53 117/69  Pulse: 94 93 81 78  Temp:  98.2 F (36.8 C) 98.3 F (36.8 C) 98.1 F (36.7 C)  TempSrc:  Oral Oral Oral  Resp: Height:   (1.676 m)    Weight:  60.827 kg    SpO2: 98% 100% 99% 97%     Jaynie Crumble, PA-C 07/08/15 0849  Lavera Guise, MD 07/08/15 1942

## 2015-07-05 NOTE — ED Notes (Signed)
Pt sitting on the  End of the bed.  Reminded to be careful and not fall    He retorted that if he fell he would fall backward on the bed  1st liter of fluid still going

## 2015-07-05 NOTE — ED Notes (Signed)
Per EMS, found on the side of [address], unconscious. With responders, alert to verbal & following basic commands. Pupils constricted, signs of ETOH, pt states has "had 2 beers". Per EMS, pt states was on side of road because of his "insomnia". Refused IV access for EMS. ECG unremarkable per EMS, CBG 232, BP 108/40, P80.

## 2015-07-06 ENCOUNTER — Encounter (HOSPITAL_COMMUNITY): Payer: Self-pay | Admitting: *Deleted

## 2015-07-06 DIAGNOSIS — T50901A Poisoning by unspecified drugs, medicaments and biological substances, accidental (unintentional), initial encounter: Secondary | ICD-10-CM | POA: Diagnosis not present

## 2015-07-06 DIAGNOSIS — F1910 Other psychoactive substance abuse, uncomplicated: Secondary | ICD-10-CM | POA: Diagnosis present

## 2015-07-06 DIAGNOSIS — E119 Type 2 diabetes mellitus without complications: Secondary | ICD-10-CM

## 2015-07-06 DIAGNOSIS — T50904D Poisoning by unspecified drugs, medicaments and biological substances, undetermined, subsequent encounter: Secondary | ICD-10-CM

## 2015-07-06 DIAGNOSIS — N179 Acute kidney failure, unspecified: Secondary | ICD-10-CM | POA: Diagnosis not present

## 2015-07-06 DIAGNOSIS — Z794 Long term (current) use of insulin: Secondary | ICD-10-CM

## 2015-07-06 DIAGNOSIS — E860 Dehydration: Secondary | ICD-10-CM | POA: Insufficient documentation

## 2015-07-06 LAB — RAPID URINE DRUG SCREEN, HOSP PERFORMED
AMPHETAMINES: NOT DETECTED
Barbiturates: NOT DETECTED
Benzodiazepines: POSITIVE — AB
Cocaine: POSITIVE — AB
OPIATES: POSITIVE — AB
Tetrahydrocannabinol: NOT DETECTED

## 2015-07-06 LAB — GLUCOSE, CAPILLARY
GLUCOSE-CAPILLARY: 222 mg/dL — AB (ref 65–99)
Glucose-Capillary: 201 mg/dL — ABNORMAL HIGH (ref 65–99)
Glucose-Capillary: 89 mg/dL (ref 65–99)

## 2015-07-06 LAB — BASIC METABOLIC PANEL
ANION GAP: 3 — AB (ref 5–15)
BUN: 25 mg/dL — ABNORMAL HIGH (ref 6–20)
CO2: 26 mmol/L (ref 22–32)
Calcium: 8 mg/dL — ABNORMAL LOW (ref 8.9–10.3)
Chloride: 108 mmol/L (ref 101–111)
Creatinine, Ser: 1.1 mg/dL (ref 0.61–1.24)
Glucose, Bld: 142 mg/dL — ABNORMAL HIGH (ref 65–99)
POTASSIUM: 4.1 mmol/L (ref 3.5–5.1)
SODIUM: 137 mmol/L (ref 135–145)

## 2015-07-06 LAB — URINALYSIS, ROUTINE W REFLEX MICROSCOPIC
BILIRUBIN URINE: NEGATIVE
HGB URINE DIPSTICK: NEGATIVE
KETONES UR: 40 mg/dL — AB
Leukocytes, UA: NEGATIVE
NITRITE: NEGATIVE
PROTEIN: NEGATIVE mg/dL
Specific Gravity, Urine: 1.029 (ref 1.005–1.030)
pH: 5.5 (ref 5.0–8.0)

## 2015-07-06 LAB — URINE MICROSCOPIC-ADD ON
Bacteria, UA: NONE SEEN
RBC / HPF: NONE SEEN RBC/hpf (ref 0–5)
WBC UA: NONE SEEN WBC/hpf (ref 0–5)

## 2015-07-06 LAB — TSH: TSH: 0.658 u[IU]/mL (ref 0.350–4.500)

## 2015-07-06 MED ORDER — QUETIAPINE FUMARATE 200 MG PO TABS
200.0000 mg | ORAL_TABLET | Freq: Every day | ORAL | Status: DC
  Filled 2015-07-06: qty 1

## 2015-07-06 MED ORDER — INSULIN ASPART 100 UNIT/ML ~~LOC~~ SOLN
0.0000 [IU] | Freq: Three times a day (TID) | SUBCUTANEOUS | Status: DC
  Administered 2015-07-06: 3 [IU] via SUBCUTANEOUS

## 2015-07-06 MED ORDER — SODIUM CHLORIDE 0.9 % IV SOLN: INTRAVENOUS | Status: DC

## 2015-07-06 MED ORDER — INSULIN GLARGINE 100 UNIT/ML ~~LOC~~ SOLN
25.0000 [IU] | Freq: Every day | SUBCUTANEOUS | Status: DC
  Administered 2015-07-06: 25 [IU] via SUBCUTANEOUS
  Filled 2015-07-06: qty 0.25

## 2015-07-06 MED ORDER — PANTOPRAZOLE SODIUM 40 MG PO TBEC
40.0000 mg | DELAYED_RELEASE_TABLET | Freq: Every day | ORAL | Status: DC
  Administered 2015-07-06: 40 mg via ORAL
  Filled 2015-07-06: qty 1

## 2015-07-06 MED ORDER — SERTRALINE HCL 50 MG PO TABS
50.0000 mg | ORAL_TABLET | Freq: Every day | ORAL | Status: DC
  Administered 2015-07-06: 50 mg via ORAL
  Filled 2015-07-06: qty 1

## 2015-07-06 MED ORDER — HEPARIN SODIUM (PORCINE) 5000 UNIT/ML IJ SOLN
5000.0000 [IU] | Freq: Three times a day (TID) | INTRAMUSCULAR | Status: DC
  Administered 2015-07-06: 5000 [IU] via SUBCUTANEOUS
  Filled 2015-07-06: qty 1

## 2015-07-06 MED ORDER — TRAZODONE HCL 50 MG PO TABS: 50.0000 mg | ORAL_TABLET | Freq: Every day | ORAL | Status: DC | PRN

## 2015-07-06 MED ORDER — GABAPENTIN 100 MG PO CAPS
100.0000 mg | ORAL_CAPSULE | Freq: Every day | ORAL | Status: DC
  Administered 2015-07-06: 100 mg via ORAL
  Filled 2015-07-06: qty 1

## 2015-07-06 NOTE — Discharge Summary (Signed)
Physician Discharge Summary  William Acevedo:[id number] DOB: Sep 12, 1955 DOA: 07/05/2015  PCP: Reports PCP in [place]  Admit date: 07/05/2015 Discharge date: 07/06/2015  Admitted From: Home Disposition:  Home  Recommendations for Outpatient Follow-up:  1. Follow up with PCP in 1-2 weeks   Home Health: No Equipment/Devices: No  Discharge Condition: Stable CODE STATUS: Full code Diet recommendation: Carb modified    Discharge Diagnoses:  Principal Problem:  Possible Overdose   Active Problems:   AKI (acute kidney injury) (HCC)   Polysubstance abuse   DM2 (diabetes mellitus, type 2) (HCC)   Dehydration  Brief narrative 60 year old male with history of diabetes mellitus, depression, polysubstance abuse including cocaine took Dilaudid and cocaine trying to sleep (reported that he hadn't slept in the past 10 days). He was found unconscious on the side of the [place] on [address] and was brought to the ED. In the ED his mentation was intact. Vitals stable. Blood work showed acute kidney injury with possible orthostasis. Urine drug screen was positive for cocaine, opiates and benzos. Admitted to hospital service for further management.  Hospital course Possible overdose Seems unintentional. Mentation and vitals stable on admission. No other issues overnight.  Acute kidney injury Likely secondary to dehydration and cocaine use. Hydrated with normal saline and function has improved to normal. Patient takes meloxicam for pain and I will discontinue it. Metformin can be resumed.  Type 2 diabetes mellitus Resume home dose Lantus and metformin. Follow-up with PCP  Patient's PCP is listed in [place] but reports that he lives in [place], has a PCP that and plans to go there in 2-3 days Discharge Instructions     Medication List    STOP taking these medications        meloxicam 7.5 MG tablet  Commonly known as:  MOBIC     methocarbamol 500 MG tablet   Commonly known as:  ROBAXIN      TAKE these medications        gabapentin 100 MG capsule  Commonly known as:  NEURONTIN  Take 1 capsule (100 mg total) by mouth 3 (three) times daily.     glipiZIDE 5 MG tablet  Commonly known as:  GLUCOTROL  Take 5 mg by mouth daily before breakfast. Reported on 07/05/2015     insulin glargine 100 UNIT/ML injection  Commonly known as:  LANTUS  Inject 25 Units into the skin 2 (two) times daily. Reported on 07/05/2015     metFORMIN 500 MG tablet  Commonly known as:  GLUCOPHAGE  Take 1,000 mg by mouth 2 (two) times daily with a meal. Reported on 07/05/2015     omeprazole 20 MG capsule  Commonly known as:  PRILOSEC  Take 20 mg by mouth daily. Reported on 07/05/2015     sertraline 50 MG tablet  Commonly known as:  ZOLOFT  Take 50 mg by mouth daily. Reported on 07/05/2015     traZODone 50 MG tablet  Commonly known as:  DESYREL  Take 50 mg by mouth daily as needed for sleep. Reported on 07/05/2015           Follow-up Information    Follow up with Priscille Kluver, MD. Schedule an appointment as soon as possible for a visit in 1 week.   Specialty:  Hematology   Contact information:   [address] [place] [address] [phone]      No Known Allergies  Consultations:  none   Procedures/Studies: Ct Head Wo Contrast  07/05/2015  CLINICAL DATA:  Patient found unconscious on side of highway. Pupils constricted. Initial encounter. EXAM: CT HEAD WITHOUT CONTRAST TECHNIQUE: Contiguous axial images were obtained from the base of the skull through the vertex without intravenous contrast. COMPARISON:  None. FINDINGS: There is no evidence of acute infarction, mass lesion, or intra- or extra-axial hemorrhage on CT. The posterior fossa, including the cerebellum, brainstem and fourth ventricle, is within normal limits. The third and lateral ventricles, and basal ganglia are unremarkable in appearance. The cerebral hemispheres are symmetric  in appearance, with normal gray-white differentiation. No mass effect or midline shift is seen. There is no evidence of fracture; visualized osseous structures are unremarkable in appearance. The visualized portions of the orbits are within normal limits. There is opacification of the left maxillary sinus, left ethmoid air cells and left frontal sinus. The remaining visualized paranasal sinuses and mastoid air cells are well-aerated. No significant soft tissue abnormalities are seen. IMPRESSION: 1. No acute intracranial pathology seen on CT. 2. Opacification of the left maxillary sinus, left ethmoid air cells and left frontal sinus. Electronically Signed   By: Roanna Raider M.D.   On: 07/05/2015 23:17     Subjective:   Discharge Exam: Filed Vitals:   07/06/15 0623 07/06/15 0801  BP: 102/53 117/69  Pulse: 81 78  Temp: 98.3 F (36.8 C) 98.1 F (36.7 C)  Resp: 20 20   Filed Vitals:   07/06/15 0030 07/06/15 0147 07/06/15 0623 07/06/15 0801  BP: 153/82  102/53 117/69  Pulse: 94 93 81 78  Temp:  98.2 F (36.8 C) 98.3 F (36.8 C) 98.1 F (36.7 C)  TempSrc:  Oral Oral Oral  Resp: Height:   (1.676 m)    Weight:  60.827 kg (134 lb 1.6 oz)    SpO2: 98% 100% 99% 97%    General: Not in distress HEENT:No pallor, moist mucosa Cardiovascular: Normal S1 and S2, no murmurs Respiratory: Clear bilaterally Abdominal: Soft, NT, ND, bowel sounds + Extremities: no edema,  CNS: Alert and oriented   The results of significant diagnostics from this hospitalization (including imaging, microbiology, ancillary and laboratory) are listed below for reference.     Microbiology: No results found for this or any previous visit (from the past 240 hour(s)).   Labs: BNP (last 3 results) No results for input(s): BNP in the last 8760 hours. Basic Metabolic Panel:  Recent Labs Lab 07/05/15 2030 07/06/15 0545  NA 136 137  K 4.2 4.1  CL 97* 108  CO2 23 26  GLUCOSE 250* 142*  BUN  33* 25*  CREATININE 1.99* 1.10  CALCIUM 10.1 8.0*   Liver Function Tests:  Recent Labs Lab 07/05/15 2030  AST 39  ALT 31  ALKPHOS 70  BILITOT 0.9  PROT 8.9*  ALBUMIN 4.6   No results for input(s): LIPASE, AMYLASE in the last 168 hours. No results for input(s): AMMONIA in the last 168 hours. CBC:  Recent Labs Lab 07/05/15 2030  WBC 11.8*  NEUTROABS 8.8*  HGB 14.5  HCT 43.7  MCV 84.2  PLT 255   Cardiac Enzymes:  Recent Labs Lab 07/05/15 2030  CKTOTAL 465*   BNP: Invalid input(s): POCBNP CBG:  Recent Labs Lab 07/06/15 0145 07/06/15 0803 07/06/15 1133  GLUCAP 222* 201* 89   D-Dimer No results for input(s): DDIMER in the last 72 hours. Hgb A1c No results for input(s): HGBA1C in the last 72 hours. Lipid Profile No results for input(s): CHOL, HDL, LDLCALC, TRIG,  CHOLHDL, LDLDIRECT in the last 72 hours. Thyroid function studies  Recent Labs  07/06/15 0103  TSH 0.658   Anemia work up No results for input(s): VITAMINB12, FOLATE, FERRITIN, TIBC, IRON, RETICCTPCT in the last 72 hours. Urinalysis    Component Value Date/Time   COLORURINE YELLOW 07/06/2015 0211   APPEARANCEUR CLOUDY* 07/06/2015 0211   LABSPEC 1.029 07/06/2015 0211   PHURINE 5.5 07/06/2015 0211   GLUCOSEU >1000* 07/06/2015 0211   HGBUR NEGATIVE 07/06/2015 0211   BILIRUBINUR NEGATIVE 07/06/2015 0211   KETONESUR 40* 07/06/2015 0211   PROTEINUR NEGATIVE 07/06/2015 0211   NITRITE NEGATIVE 07/06/2015 0211   LEUKOCYTESUR NEGATIVE 07/06/2015 0211   Sepsis Labs Invalid input(s): PROCALCITONIN,  WBC,  LACTICIDVEN Microbiology No results found for this or any previous visit (from the past 240 hour(s)).   Time coordinating discharge: 25 minutes  SIGNED:   Eddie NorthHUNGEL, Teagon Kron, MD  [facility] 07/06/2015, 1:10 PM Pager   If 7PM-7AM, please contact night-coverage [email or web address] Password [email or web address]

## 2015-07-06 NOTE — Discharge Instructions (Signed)
Polysubstance Abuse °When people abuse more than one drug or type of drug it is called polysubstance or polydrug abuse. For example, many smokers also drink alcohol. This is one form of polydrug abuse. Polydrug abuse also refers to the use of a drug to counteract an unpleasant effect produced by another drug. It may also be used to help with withdrawal from another drug. People who take stimulants may become agitated. Sometimes this agitation is countered with a tranquilizer. This helps protect against the unpleasant side effects. Polydrug abuse also refers to the use of different drugs at the same time.  °Anytime drug use is interfering with normal living activities, it has become abuse. This includes problems with family and friends. Psychological dependence has developed when your mind tells you that the drug is needed. This is usually followed by physical dependence which has developed when continuing increases of drug are required to get the same feeling or "high". This is known as addiction or chemical dependency. A person's risk is much higher if there is a history of chemical dependency in the family. °SIGNS OF CHEMICAL DEPENDENCY °· You have been told by friends or family that drugs have become a problem. °· You fight when using drugs. °· You are having blackouts (not remembering what you do while using). °· You feel sick from using drugs but continue using. °· You lie about use or amounts of drugs (chemicals) used. °· You need chemicals to get you going. °· You are suffering in work performance or in school because of drug use. °· You get sick from use of drugs but continue to use anyway. °· You need drugs to relate to people or feel comfortable in social situations. °· You use drugs to forget problems. °"Yes" answered to any of the above signs of chemical dependency indicates there are problems. The longer the use of drugs continues, the greater the problems will become. °If there is a family history of  drug or alcohol use, it is best not to experiment with these drugs. Continual use leads to tolerance. After tolerance develops more of the drug is needed to get the same feeling. This is followed by addiction. With addiction, drugs become the most important part of life. It becomes more important to take drugs than participate in the other usual activities of life. This includes relating to friends and family. Addiction is followed by dependency. Dependency is a condition where drugs are now needed not just to get high, but to feel normal. °Addiction cannot be cured but it can be stopped. This often requires outside help and the care of professionals. Treatment centers are listed in the yellow pages under: Cocaine, Narcotics, and Alcoholics Anonymous. Most hospitals and clinics can refer you to a specialized care center. Talk to your caregiver if you need help. °  °This information is not intended to replace advice given to you by your health care provider. Make sure you discuss any questions you have with your health care provider. °  °Document Released: 08/25/2004 Document Revised: 03/28/2011 Document Reviewed: 01/08/2014 °Elsevier Interactive Patient Education ©2016 [employer] ° °

## 2015-07-06 NOTE — H&P (Signed)
History and Physical    William Acevedo:[id number] DOB: 02/17/1955 DOA: 07/05/2015   PCP: Priscille Kluver, MD Chief Complaint:  Chief Complaint  Patient presents with  . Loss of Consciousness    HPI: William Acevedo is a 60 y.o. male with medical history significant of DM, depression, patient states he wanted to go to sleep because he hasnt had any sleep in past 10 days so he took cocaine and dilaudid today.  He was found unconsious on the side of the road [place] [address], and brought in to the ED.  ED Course: In the ED he is now awake, alert, asking to make a report to police about something.  He has AKI, suspected dehydration with orthostasis which has resolved after IVF.  Review of Systems: As per HPI otherwise 10 point review of systems negative.    Past Medical History  Diagnosis Date  . Diabetes mellitus without complication (HCC)   . Neuropathy (HCC)   . Depression   . Carpal tunnel syndrome     Past Surgical History  Procedure Laterality Date  . Testicle torsion reduction       reports that he has been smoking Cigarettes.  He has been smoking about 0.50 packs per day. He does not have any smokeless tobacco history on file. He reports that he does not drink alcohol or use illicit drugs.  No Known Allergies  History reviewed. No pertinent family history. No history of overdose.   Prior to Admission medications   Medication Sig Start Date End Date Taking? Authorizing Provider  gabapentin (NEURONTIN) 100 MG capsule Take 1 capsule (100 mg total) by mouth 3 (three) times daily. Patient taking differently: Take 100 mg by mouth daily.  12/02/13  Yes Hannah Muthersbaugh, PA-C  meloxicam (MOBIC) 7.5 MG tablet Take 1 tablet (7.5 mg total) by mouth daily. 05/20/14  Yes Lonia Skinner Sofia, PA-C  glipiZIDE (GLUCOTROL) 5 MG tablet Take 5 mg by mouth daily before breakfast. Reported on 07/05/2015    Historical Provider, MD  insulin glargine (LANTUS) 100 UNIT/ML injection Inject  25 Units into the skin 2 (two) times daily. Reported on 07/05/2015    Historical Provider, MD  metFORMIN (GLUCOPHAGE) 500 MG tablet Take 1,000 mg by mouth 2 (two) times daily with a meal. Reported on 07/05/2015    Historical Provider, MD  methocarbamol (ROBAXIN) 500 MG tablet Take 1 tablet (500 mg total) by mouth 2 (two) times daily. Patient not taking: Reported on 07/05/2015 07/23/14   Santiago Glad, PA-C  omeprazole (PRILOSEC) 20 MG capsule Take 20 mg by mouth daily. Reported on 07/05/2015    Historical Provider, MD  sertraline (ZOLOFT) 50 MG tablet Take 50 mg by mouth daily. Reported on 07/05/2015    Historical Provider, MD  traZODone (DESYREL) 50 MG tablet Take 50 mg by mouth daily as needed for sleep. Reported on 07/05/2015    Historical Provider, MD    Physical Exam: Filed Vitals:   07/05/15 2100 07/05/15 2130 07/05/15 2200 07/05/15 2300  BP: 102/65 120/76 136/80 112/54  Pulse: 88 92 90 90  Resp: SpO2: 98% 97% 99% 94%      Constitutional: NAD, calm, comfortable Eyes: PERRL, lids and conjunctivae normal ENMT: Mucous membranes are moist. Posterior pharynx clear of any exudate or lesions.Normal dentition.  Neck: normal, supple, no masses, no thyromegaly Respiratory: clear to auscultation bilaterally, no wheezing, no crackles. Normal respiratory effort. No accessory muscle use.  Cardiovascular: Regular rate and rhythm,  no murmurs / rubs / gallops. No extremity edema. 2+ pedal pulses. No carotid bruits.  Abdomen: no tenderness, no masses palpated. No hepatosplenomegaly. Bowel sounds positive.  Musculoskeletal: no clubbing / cyanosis. No joint deformity upper and lower extremities. Good ROM, no contractures. Normal muscle tone.  Skin: no rashes, lesions, ulcers. No induration Neurologic: CN 2-12 grossly intact. Sensation intact, DTR normal. Strength 5/5 in all 4.  Psychiatric: Normal judgment and insight. Alert and oriented x 3. Normal mood.    Labs on Admission: I have  personally reviewed following labs and imaging studies  CBC:  Recent Labs Lab 07/05/15 2030  WBC 11.8*  NEUTROABS 8.8*  HGB 14.5  HCT 43.7  MCV 84.2  PLT 255   Basic Metabolic Panel:  Recent Labs Lab 07/05/15 2030  NA 136  K 4.2  CL 97*  CO2 23  GLUCOSE 250*  BUN 33*  CREATININE 1.99*  CALCIUM 10.1   GFR: CrCl cannot be calculated (Unknown ideal weight.). Liver Function Tests:  Recent Labs Lab 07/05/15 2030  AST 39  ALT 31  ALKPHOS 70  BILITOT 0.9  PROT 8.9*  ALBUMIN 4.6   No results for input(s): LIPASE, AMYLASE in the last 168 hours. No results for input(s): AMMONIA in the last 168 hours. Coagulation Profile: No results for input(s): INR, PROTIME in the last 168 hours. Cardiac Enzymes:  Recent Labs Lab 07/05/15 2030  CKTOTAL 465*   BNP (last 3 results) No results for input(s): PROBNP in the last 8760 hours. HbA1C: No results for input(s): HGBA1C in the last 72 hours. CBG: No results for input(s): GLUCAP in the last 168 hours. Lipid Profile: No results for input(s): CHOL, HDL, LDLCALC, TRIG, CHOLHDL, LDLDIRECT in the last 72 hours. Thyroid Function Tests: No results for input(s): TSH, T4TOTAL, FREET4, T3FREE, THYROIDAB in the last 72 hours. Anemia Panel: No results for input(s): VITAMINB12, FOLATE, FERRITIN, TIBC, IRON, RETICCTPCT in the last 72 hours. Urine analysis: No results found for: COLORURINE, APPEARANCEUR, LABSPEC, PHURINE, GLUCOSEU, HGBUR, BILIRUBINUR, KETONESUR, PROTEINUR, UROBILINOGEN, NITRITE, LEUKOCYTESUR Sepsis Labs: @LABRCNTIP (procalcitonin:4,lacticidven:4) )No results found for this or any previous visit (from the past 240 hour(s)).   Radiological Exams on Admission: Ct Head Wo Contrast  07/05/2015  CLINICAL DATA:  Patient [place] side of highway. Pupils constricted. Initial encounter. EXAM: CT HEAD WITHOUT CONTRAST TECHNIQUE: Contiguous axial images were obtained from the base of the skull through the vertex  without intravenous contrast. COMPARISON:  None. FINDINGS: There is no evidence of acute infarction, mass lesion, or intra- or extra-axial hemorrhage on CT. The posterior fossa, including the cerebellum, brainstem and fourth ventricle, is within normal limits. The third and lateral ventricles, and basal ganglia are unremarkable in appearance. The cerebral hemispheres are symmetric in appearance, with normal gray-white differentiation. No mass effect or midline shift is seen. There is no evidence of fracture; visualized osseous structures are unremarkable in appearance. The visualized portions of the orbits are within normal limits. There is opacification of the left maxillary sinus, left ethmoid air cells and left frontal sinus. The remaining visualized paranasal sinuses and mastoid air cells are well-aerated. No significant soft tissue abnormalities are seen. IMPRESSION: 1. No acute intracranial pathology seen on CT. 2. Opacification of the left maxillary sinus, left ethmoid air cells and left frontal sinus. Electronically Signed   By: Roanna Raider M.D.   On: 07/05/2015 23:17    EKG: Independently reviewed.  Assessment/Plan Principal Problem:   Overdose Active Problems:   AKI (acute kidney injury) (HCC)  Polysubstance abuse   DM2 (diabetes mellitus, type 2) (HCC)    OD - Unintentional, meds have worn off thankfully, will observe overnight  AKI - likely related to OD and exposure, Repeat BMP in AM  DM2 - Continue home Lantus, holding PO hypoglycemics, sensitive scale SSI AC/HS     DVT prophylaxis: Heparin [place] Code Status: Full Family Communication: No family in room Consults called: None Admission status: Admit to obs   GARDNER, Heywood IlesJARED M. DO [facility] Pager [phone] from 7PM-7AM  If 7AM-7PM, please contact the day physician for the patient [email or web address] Password [email or web address]  07/06/2015, 12:30 AM

## 2015-07-06 NOTE — Progress Notes (Signed)
The patient came and retrieved his wallet. He opened it up in the nurse station. Nothing missing in his cards/id's.

## 2015-07-06 NOTE — Progress Notes (Signed)
The housekeeping found his wallet with ID's and cards under the bed and handed to me. Called the patient's brother- William Acevedo and notified him. He said he will tell his brother

## 2015-07-06 NOTE — Progress Notes (Signed)
Patient for discharge home with no apparent distress noted. Medications and discharge instructions explained to the patient and he verbalized understanding. Copies given to him. IV saline lock removed.  Patient saying that he has a wallet with ID's and cards on it when he came in the ED. Notified him that the floor nurse has no documentation of wallet as his belongings, only clothing. Called and notified Security about the wallet lost. Spoke to [place]. He checked and called me back that there is no belongings kept in the Security for this patient. Made the patient aware.

## 2015-10-22 ENCOUNTER — Emergency Department (HOSPITAL_COMMUNITY)
Admission: EM | Admit: 2015-10-22 | Discharge: 2015-10-22 | Disposition: A | Discharge status: Home / Self Care | Payer: Non-veteran care | Attending: Emergency Medicine | Admitting: Emergency Medicine

## 2015-10-22 ENCOUNTER — Emergency Department (HOSPITAL_COMMUNITY): Payer: Non-veteran care

## 2015-10-22 ENCOUNTER — Encounter (HOSPITAL_COMMUNITY): Payer: Self-pay | Admitting: *Deleted

## 2015-10-22 DIAGNOSIS — Z5189 Encounter for other specified aftercare: Secondary | ICD-10-CM

## 2015-10-22 DIAGNOSIS — F17210 Nicotine dependence, cigarettes, uncomplicated: Secondary | ICD-10-CM | POA: Insufficient documentation

## 2015-10-22 DIAGNOSIS — Z794 Long term (current) use of insulin: Secondary | ICD-10-CM | POA: Diagnosis not present

## 2015-10-22 DIAGNOSIS — Z7984 Long term (current) use of oral hypoglycemic drugs: Secondary | ICD-10-CM | POA: Diagnosis not present

## 2015-10-22 DIAGNOSIS — E1140 Type 2 diabetes mellitus with diabetic neuropathy, unspecified: Secondary | ICD-10-CM | POA: Diagnosis not present

## 2015-10-22 DIAGNOSIS — N50812 Left testicular pain: Secondary | ICD-10-CM | POA: Insufficient documentation

## 2015-10-22 DIAGNOSIS — Z4801 Encounter for change or removal of surgical wound dressing: Secondary | ICD-10-CM | POA: Diagnosis not present

## 2015-10-22 DIAGNOSIS — R52 Pain, unspecified: Secondary | ICD-10-CM

## 2015-10-22 DIAGNOSIS — N5082 Scrotal pain: Secondary | ICD-10-CM

## 2015-10-22 LAB — URINALYSIS, ROUTINE W REFLEX MICROSCOPIC
Bilirubin Urine: NEGATIVE
Glucose, UA: >1000 mg/dL — AB
HGB URINE DIPSTICK: NEGATIVE
Ketones, ur: NEGATIVE mg/dL
LEUKOCYTES UA: NEGATIVE
Nitrite: NEGATIVE
Protein, ur: NEGATIVE mg/dL
SPECIFIC GRAVITY, URINE: 1.031 — AB (ref 1.005–1.030)
pH: 5.5 (ref 5.0–8.0)

## 2015-10-22 LAB — URINE MICROSCOPIC-ADD ON: RBC / HPF: NONE SEEN RBC/hpf (ref 0–5)

## 2015-10-22 MED ORDER — OXYCODONE-ACETAMINOPHEN 5-325 MG PO TABS
ORAL_TABLET | ORAL | Status: DC
Start: 2015-10-22 — End: 2015-10-22
  Filled 2015-10-22: qty 1

## 2015-10-22 MED ORDER — HYDROCODONE-ACETAMINOPHEN 5-325 MG PO TABS: 2.0000 | ORAL_TABLET | ORAL | 0 refills | Status: DC | PRN

## 2015-10-22 MED ORDER — OXYCODONE-ACETAMINOPHEN 5-325 MG PO TABS
1.0000 | ORAL_TABLET | Freq: Once | ORAL | Status: AC
  Administered 2015-10-22: 1 via ORAL

## 2015-10-22 NOTE — Discharge Instructions (Signed)
Use heat on the sore area 3 or 4 times a day.  Follow-up with your urologist as soon as possible.

## 2015-10-22 NOTE — ED Triage Notes (Signed)
Pt states he was dx with a testicle torsion 40 years ago, pt had left testicle removed 3 weeks ago. Pt states yesterday he started having left testicle pain radiating into his abdomen. Pt denies n/v/d, denies fevers.

## 2015-10-22 NOTE — ED Notes (Signed)
Pt ambulatory to room A08 with steady gait noted

## 2015-10-22 NOTE — ED Provider Notes (Addendum)
[facility]-EMERGENCY DEPT Provider Note   CSN: [id number] Arrival date & time: 10/22/15  0031  By signing my name below, I, William Acevedo, attest that this documentation has been prepared under the direction and in the presence of Mancel BaleElliott Julizza Sassone, MD. Electronically Signed: Angelene GiovanniEmmanuella Acevedo, ED Scribe. 10/22/15. 3:41 AM.    History   Chief Complaint Chief Complaint  Patient presents with  . Testicle Pain  . Abdominal Pain    HPI Comments: William Acevedo is a 60 y.o. male with a hx of testicle torsion reduction and DM who presents to the Emergency Department complaining of gradually worsening moderate left testicular pain that radiates up toward his left suprapubic area and left back pain onset 3 weeks ago. He reports associated one episode of non-bloody vomiting 6 days ago. He explains that he was diagnosed with testicular torsion 40 years ago and had an orchiopexy then. He states that he has been having persistent pain since that procedure and had his left testicle [place] 3 [place] to alleviate the pain however his pain is still persisting. No other alleviating factors noted. Pt has not tried any medications PTA. He has NKDA. He denies any fever or nausea.   The history is provided by the patient. No language interpreter was used.  Testicle Pain  This is a chronic problem. The current episode started more than 1 week ago. The problem has been gradually worsening. Associated symptoms include abdominal pain. Nothing aggravates the symptoms. Nothing relieves the symptoms. He has tried nothing for the symptoms.    Past Medical History:  Diagnosis Date  . Carpal tunnel syndrome   . Depression   . Diabetes mellitus without complication (HCC)   . Neuropathy [facility](HCC)     Patient Active Problem List   Diagnosis Date Noted  . Overdose 07/06/2015  . AKI (acute kidney injury) (HCC) 07/06/2015  . Polysubstance abuse 07/06/2015  . DM2 (diabetes mellitus, type 2) (HCC)  07/06/2015  . Dehydration   . MDD (major depressive disorder), recurrent episode, mild (HCC)   . Uncomplicated alcohol dependence (HCC)     Past Surgical History:  Procedure Laterality Date  . TESTICLE TORSION REDUCTION         Home Medications    Prior to Admission medications   Medication Sig Start Date End Date Taking? Authorizing Provider  gabapentin (NEURONTIN) 100 MG capsule Take 1 capsule (100 mg total) by mouth 3 (three) times daily. Patient taking differently: Take 100 mg by mouth daily.  12/02/13  Yes Hannah Muthersbaugh, PA-C  glipiZIDE (GLUCOTROL) 5 MG tablet Take 5 mg by mouth daily before breakfast. Reported on 07/05/2015   Yes Historical Provider, MD  insulin glargine (LANTUS) 100 UNIT/ML injection Inject 25 Units into the skin 2 (two) times daily. Reported on 07/05/2015   Yes Historical Provider, MD  metFORMIN (GLUCOPHAGE) 500 MG tablet Take 1,000 mg by mouth 2 (two) times daily with a meal. Reported on 07/05/2015   Yes Historical Provider, MD  omeprazole (PRILOSEC) 20 MG capsule Take 20 mg by mouth daily. Reported on 07/05/2015   Yes Historical Provider, MD  ondansetron (ZOFRAN) 4 MG tablet Take 4 mg by mouth every 8 (eight) hours as needed for nausea or vomiting.   Yes Historical Provider, MD  oxyCODONE-acetaminophen (PERCOCET) 10-325 MG tablet Take 1-2 tablets by mouth every 4 (four) hours as needed for pain.   Yes Historical Provider, MD  promethazine (PHENERGAN) 25 MG tablet Take 25 mg by mouth every 6 (six) hours  as needed for nausea or vomiting.   Yes Historical Provider, MD  sertraline (ZOLOFT) 50 MG tablet Take 50 mg by mouth daily. Reported on 07/05/2015   Yes Historical Provider, MD  traZODone (DESYREL) 50 MG tablet Take 50 mg by mouth daily as needed for sleep. Reported on 07/05/2015   Yes Historical Provider, MD  HYDROcodone-acetaminophen (NORCO) 5-325 MG tablet Take 2 tablets by mouth every 4 (four) hours as needed. 10/22/15   Mancel Bale, MD    Family  History History reviewed. No pertinent family history.  Social History Social History  Substance Use Topics  . Smoking status: Current Every Day Smoker    Packs/day: 0.50    Types: Cigarettes  . Smokeless tobacco: Not on file  . Alcohol use No     Allergies   Review of patient's allergies indicates no known allergies.   Review of Systems Review of Systems  Constitutional: Negative for fever.  Gastrointestinal: Positive for abdominal pain and vomiting. Negative for nausea.  Genitourinary: Positive for testicular pain.     Physical Exam Updated Vital Signs BP 134/74   Pulse 66   Temp 98.5 F (36.9 C) (Oral)   Resp 18   Ht 5\' 6"  (1.676 m)   Wt 127 lb (57.6 kg)   SpO2 97%   BMI 20.50 kg/m   Physical Exam  Constitutional: He is oriented to person, place, and time. He appears well-developed and well-nourished.  HENT:  Head: Normocephalic and atraumatic.  Right Ear: External ear normal.  Left Ear: External ear normal.  Eyes: Conjunctivae and EOM are normal. Pupils are equal, round, and reactive to light.  Neck: Normal range of motion and phonation normal. Neck supple.  Cardiovascular: Normal rate, regular rhythm and normal heart sounds.   Pulmonary/Chest: Effort normal and breath sounds normal. He exhibits no bony tenderness.  Abdominal: Soft. There is no tenderness.  Genitourinary: Penis normal.  Genitourinary Comments: Normal appearing scrotum with healing left sided surgical wound- no associated redness, drainage, bleeding or dehiscence. No groin hernia.  Musculoskeletal: Normal range of motion.  Neurological: He is alert and oriented to person, place, and time. No cranial nerve deficit or sensory deficit. He exhibits normal muscle tone. Coordination normal.  Skin: Skin is warm, dry and intact.  Psychiatric: He has a normal mood and affect. His behavior is normal. Judgment and thought content normal.  Nursing note and vitals reviewed.    ED Treatments / Results   DIAGNOSTIC STUDIES: Oxygen Saturation is 97% on RA, normal by my interpretation.    COORDINATION OF CARE: 3:38 AM- Pt advised of plan for treatment and pt agrees. He will receive lab work and US scrotum  for further evaluation. He will also receive Percocet.    Labs (all labs ordered are listed, but only abnormal results are displayed) Labs Reviewed  URINALYSIS, ROUTINE W REFLEX MICROSCOPIC (NOT AT [facility]) - Abnormal; Notable for the following:       Result Value   Specific Gravity, Urine 1.031 (*)    Glucose, UA >1000 (*)    All other components within normal limits  URINE MICROSCOPIC-ADD ON - Abnormal; Notable for the following:    Squamous Epithelial / LPF 0-5 (*)    Bacteria, UA RARE (*)    Crystals CA OXALATE CRYSTALS (*)    All other components within normal limits    EKG  EKG Interpretation None       Radiology US Scrotum  Result Date: 10/22/2015 CLINICAL DATA:  Left scrotal pain  for 1 day. Left testis removed 3 weeks ago due to pain. EXAM: SCROTAL ULTRASOUND DOPPLER ULTRASOUND OF THE TESTICLES TECHNIQUE: Complete ultrasound examination of the testicles, epididymis, and other scrotal structures was performed. Color and spectral Doppler ultrasound were also utilized to evaluate blood flow to the testicles. COMPARISON:  None. FINDINGS: Right testicle Measurements: 3.7 x 2.6 x 2.5 cm. No mass or microlithiasis visualized. Left testicle Left testis is surgically absent. In the left hemiscrotum, corresponding to the location of the patient's pain, there is a circumscribed complex collection measuring 2.3 x 1.9 x 2 cm. The lesion is composed of mixed hyper and hypoechoic material. Mild flow is demonstrated in the area on color flow Doppler imaging. Etiology is uncertain. In the postoperative setting, this could represent a hematoma. Infection or fat necrosis are not excluded. Right epididymis:  Normal in size and appearance. Left epididymis:  Surgically absent Hydrocele:  None  visualized. Varicocele:  Small left varicocele. Pulsed Doppler interrogation of the right testis demonstrates normal low resistance arterial and venous waveforms. IMPRESSION: Normal appearance of the right testis. No evidence of testicular mass or torsion. Left testis is surgically absent. In the left hemiscrotum, corresponding to the location of the patient's pain, there is a circumscribed complex collection with mild flow mostly in the periphery. This could represent postoperative hematoma, infection, or fat necrosis. Electronically Signed   By: Burman Nieves M.D.   On: 10/22/2015 05:01   [place] Art/ven Flow Abd Pelv Doppler  Result Date: 10/22/2015 CLINICAL DATA:  Left scrotal pain for 1 day. Left testis removed 3 weeks ago due to pain. EXAM: SCROTAL ULTRASOUND DOPPLER ULTRASOUND OF THE TESTICLES TECHNIQUE: Complete ultrasound examination of the testicles, epididymis, and other scrotal structures was performed. Color and spectral Doppler ultrasound were also utilized to evaluate blood flow to the testicles. COMPARISON:  None. FINDINGS: Right testicle Measurements: 3.7 x 2.6 x 2.5 cm. No mass or microlithiasis visualized. Left testicle Left testis is surgically absent. In the left hemiscrotum, corresponding to the location of the patient's pain, there is a circumscribed complex collection measuring 2.3 x 1.9 x 2 cm. The lesion is composed of mixed hyper and hypoechoic material. Mild flow is demonstrated in the area on color flow Doppler imaging. Etiology is uncertain. In the postoperative setting, this could represent a hematoma. Infection or fat necrosis are not excluded. Right epididymis:  Normal in size and appearance. Left epididymis:  Surgically absent Hydrocele:  None visualized. Varicocele:  Small left varicocele. Pulsed Doppler interrogation of the right testis demonstrates normal low resistance arterial and venous waveforms. IMPRESSION: Normal appearance of the right testis. No evidence of testicular  mass or torsion. Left testis is surgically absent. In the left hemiscrotum, corresponding to the location of the patient's pain, there is a circumscribed complex collection with mild flow mostly in the periphery. This could represent postoperative hematoma, infection, or fat necrosis. Electronically Signed   By: Burman Nieves M.D.   On: 10/22/2015 05:01    Procedures Procedures (including critical care time)  Medications Ordered in ED Medications  oxyCODONE-acetaminophen (PERCOCET/ROXICET) 5-325 MG per tablet (not administered)  oxyCODONE-acetaminophen (PERCOCET/ROXICET) 5-325 MG per tablet 1 tablet (1 tablet Oral Given 10/22/15 0040)     Initial Impression / Assessment and Plan / ED Course  Mancel Bale, MD has reviewed the triage vital signs and the nursing notes.  Pertinent labs & imaging results that were available during my care of the patient were reviewed by me and considered in my medical decision making (  see chart for details).  Clinical Course    Medications  oxyCODONE-acetaminophen (PERCOCET/ROXICET) 5-325 MG per tablet (not administered)  oxyCODONE-acetaminophen (PERCOCET/ROXICET) 5-325 MG per tablet 1 tablet (1 tablet Oral Given 10/22/15 0040)    Patient Vitals for the past 24 hrs:  BP Temp Temp src Pulse Resp SpO2 Height Weight  10/22/15 0645 134/74 - - 66 - 97 % - -  10/22/15 0500 123/68 - - 84 18 96 % - -  10/22/15 0445 135/63 - - 86 - 95 % - -  10/22/15 0439 - - - 89 - 97 % - -  10/22/15 0438 149/66 - - - - - - -  10/22/15 0037 160/89 98.5 F (36.9 C) Oral 92 20 97 % 5\' 6"  (1.676 m) 127 lb (57.6 kg)    At D/C Reevaluation with update and discussion. After initial assessment and treatment, an updated evaluation reveals he is comfortable. Findings discussed with the patient and all questions answered. Charlotte Fidalgo L    Final Clinical Impressions(s) / ED Diagnoses   Final diagnoses:  Pain  Visit for wound check  Scrotal pain    Stop to scrotal pain,  with likely hematoma. Doubt acute infectious process. Doubt surgical wound infection. Doubt retained foreign body.  Nursing Notes Reviewed/ Care Coordinated Applicable Imaging Reviewed Interpretation of Laboratory Data incorporated into ED treatment  The patient appears reasonably screened and/or stabilized for discharge and I doubt any other medical condition or other [facility] requiring further screening, evaluation, or treatment in the ED at this time prior to discharge.  Plan: Home Medications- Norco; Home Treatments- heat; return here if the recommended treatment, does not improve the symptoms; Recommended follow up- Urology f/u when returns to [facility] in 5-7 days    New Prescriptions Discharge Medication List as of 10/22/2015  7:08 AM    START taking these medications   Details  HYDROcodone-acetaminophen (NORCO) 5-325 MG tablet Take 2 tablets by mouth every 4 (four) hours as needed., Starting Thu 10/22/2015, Print       I personally performed the services described in this documentation, which was scribed in my presence. The recorded information has been reviewed and is accurate.     Mancel Bale, MD 10/22/15 1610    Mancel Bale, MD 11/17/15 [phone]

## 2016-04-13 ENCOUNTER — Emergency Department (HOSPITAL_COMMUNITY)
Admission: EM | Admit: 2016-04-13 | Discharge: 2016-04-14 | Disposition: A | Discharge status: Home / Self Care | Payer: Non-veteran care | Attending: Emergency Medicine | Admitting: Emergency Medicine

## 2016-04-13 ENCOUNTER — Encounter (HOSPITAL_COMMUNITY): Payer: Self-pay | Admitting: Emergency Medicine

## 2016-04-13 DIAGNOSIS — Y929 Unspecified place or not applicable: Secondary | ICD-10-CM | POA: Insufficient documentation

## 2016-04-13 DIAGNOSIS — Y939 Activity, unspecified: Secondary | ICD-10-CM | POA: Insufficient documentation

## 2016-04-13 DIAGNOSIS — F17210 Nicotine dependence, cigarettes, uncomplicated: Secondary | ICD-10-CM | POA: Insufficient documentation

## 2016-04-13 DIAGNOSIS — Z79899 Other long term (current) drug therapy: Secondary | ICD-10-CM | POA: Diagnosis not present

## 2016-04-13 DIAGNOSIS — R55 Syncope and collapse: Secondary | ICD-10-CM | POA: Diagnosis present

## 2016-04-13 DIAGNOSIS — W1830XA Fall on same level, unspecified, initial encounter: Secondary | ICD-10-CM | POA: Insufficient documentation

## 2016-04-13 DIAGNOSIS — E119 Type 2 diabetes mellitus without complications: Secondary | ICD-10-CM | POA: Diagnosis not present

## 2016-04-13 DIAGNOSIS — Y999 Unspecified external cause status: Secondary | ICD-10-CM | POA: Insufficient documentation

## 2016-04-13 DIAGNOSIS — Z794 Long term (current) use of insulin: Secondary | ICD-10-CM | POA: Insufficient documentation

## 2016-04-13 LAB — CBC
HCT: 38.1 % — ABNORMAL LOW (ref 39.0–52.0)
HEMOGLOBIN: 12.5 g/dL — AB (ref 13.0–17.0)
MCH: 28.4 pg (ref 26.0–34.0)
MCHC: 32.8 g/dL (ref 30.0–36.0)
MCV: 86.6 fL (ref 78.0–100.0)
Platelets: 231 10*3/uL (ref 150–400)
RBC: 4.4 MIL/uL (ref 4.22–5.81)
RDW: 12.8 % (ref 11.5–15.5)
WBC: 12.3 10*3/uL — ABNORMAL HIGH (ref 4.0–10.5)

## 2016-04-13 NOTE — ED Notes (Signed)
CBG 360, not crossing over

## 2016-04-13 NOTE — ED Triage Notes (Signed)
BIB EMS from home, C/O gen weakness and fall. Pt had abrasions to R upper lip and L eye, superficial. Pt A/O but slow to respond. Pt reports he has not been taking medicines, CBG 448 en route.

## 2016-04-14 ENCOUNTER — Emergency Department (HOSPITAL_COMMUNITY): Payer: Non-veteran care

## 2016-04-14 LAB — RAPID URINE DRUG SCREEN, HOSP PERFORMED
Amphetamines: NOT DETECTED
Barbiturates: NOT DETECTED
Benzodiazepines: NOT DETECTED
COCAINE: POSITIVE — AB
OPIATES: NOT DETECTED
Tetrahydrocannabinol: NOT DETECTED

## 2016-04-14 LAB — I-STAT CHEM 8, ED
BUN: 21 mg/dL — AB (ref 6–20)
CALCIUM ION: 1.01 mmol/L — AB (ref 1.15–1.40)
CHLORIDE: 101 mmol/L (ref 101–111)
Creatinine, Ser: 1.2 mg/dL (ref 0.61–1.24)
Glucose, Bld: 394 mg/dL — ABNORMAL HIGH (ref 65–99)
HEMATOCRIT: 37 % — AB (ref 39.0–52.0)
Hemoglobin: 12.6 g/dL — ABNORMAL LOW (ref 13.0–17.0)
POTASSIUM: 4.7 mmol/L (ref 3.5–5.1)
SODIUM: 133 mmol/L — AB (ref 135–145)
TCO2: 22 mmol/L (ref 0–100)

## 2016-04-14 LAB — URINALYSIS, ROUTINE W REFLEX MICROSCOPIC
BILIRUBIN URINE: NEGATIVE
Bacteria, UA: NONE SEEN
Glucose, UA: >=500 mg/dL — AB
Hgb urine dipstick: NEGATIVE
KETONES UR: 20 mg/dL — AB
Leukocytes, UA: NEGATIVE
Nitrite: NEGATIVE
PH: 5 (ref 5.0–8.0)
PROTEIN: 30 mg/dL — AB
SPECIFIC GRAVITY, URINE: 1.02 (ref 1.005–1.030)

## 2016-04-14 LAB — BASIC METABOLIC PANEL
ANION GAP: 12 (ref 5–15)
BUN: 20 mg/dL (ref 6–20)
CO2: 22 mmol/L (ref 22–32)
Calcium: 8.9 mg/dL (ref 8.9–10.3)
Chloride: 99 mmol/L — ABNORMAL LOW (ref 101–111)
Creatinine, Ser: 1.2 mg/dL (ref 0.61–1.24)
GFR calc non Af Amer: >60 mL/min (ref >60)
Glucose, Bld: 393 mg/dL — ABNORMAL HIGH (ref 65–99)
POTASSIUM: 4.8 mmol/L (ref 3.5–5.1)
Sodium: 133 mmol/L — ABNORMAL LOW (ref 135–145)

## 2016-04-14 LAB — I-STAT TROPONIN, ED: TROPONIN I, POC: 0 ng/mL (ref 0.00–0.08)

## 2016-04-14 LAB — ETHANOL: Alcohol, Ethyl (B): <5 mg/dL (ref <5)

## 2016-04-14 LAB — CBG MONITORING, ED
Glucose-Capillary: 360 mg/dL — ABNORMAL HIGH (ref 65–99)
Glucose-Capillary: 298 mg/dL — ABNORMAL HIGH (ref 65–99)

## 2016-04-14 MED ORDER — SODIUM CHLORIDE 0.9 % IV BOLUS (SEPSIS)
1000.0000 mL | Freq: Once | INTRAVENOUS | Status: AC
  Administered 2016-04-14: 1000 mL via INTRAVENOUS

## 2016-04-14 MED ORDER — SODIUM CHLORIDE 0.9 % IV SOLN
INTRAVENOUS | Status: DC
  Administered 2016-04-14: via INTRAVENOUS

## 2016-04-14 NOTE — ED Notes (Signed)
Istat Troponin 0.00

## 2016-04-14 NOTE — ED Notes (Signed)
Patient transported to CT 

## 2016-04-14 NOTE — ED Provider Notes (Addendum)
[facility] DEPT Provider Note   CSN: [id number] Arrival date & time: 04/13/16  2245     History   Chief Complaint Chief Complaint  Patient presents with  . Fall    HPI William Acevedo is a 61 y.o. male.With a past medical history of polysubstance abuse, alcohol dependence, who presents emergency Department with chief complaint of syncope. Patient has been previously admitted for syncopal events. Patient states that he did not have a prodrome, but woke up on the floor after having a syncopal episode today. Of note, the patient did use cocaine yesterday and likely has not slept much in the last 24 hours. The patient complains of pain in his mouth from a small laceration to the mucosal surface, along with neck pain and a headache. He also has some mild pain along the lateral side of his left eye. Orbits. The patient denies any melena, hematochezia, hematemesis. He does complain of depression but is not suicidal. Patient states that he has not been eating much lately and thinks he is passing out due to malnutrition.  HPI  Past Medical History:  Diagnosis Date  . Carpal tunnel syndrome   . Depression   . Diabetes mellitus without complication (HCC)   . Neuropathy [facility])     Patient Active Problem List   Diagnosis Date Noted  . Overdose 07/06/2015  . AKI (acute kidney injury) (HCC) 07/06/2015  . Polysubstance abuse 07/06/2015  . DM2 (diabetes mellitus, type 2) (HCC) 07/06/2015  . Dehydration   . MDD (major depressive disorder), recurrent episode, mild (HCC)   . Uncomplicated alcohol dependence (HCC)     Past Surgical History:  Procedure Laterality Date  . TESTICLE TORSION REDUCTION         Home Medications    Prior to Admission medications   Medication Sig Start Date End Date Taking? Authorizing Provider  atorvastatin (LIPITOR) 20 MG tablet Take 20 mg by mouth daily.   Yes Historical Provider, MD  gabapentin (NEURONTIN) 100 MG capsule Take 1 capsule (100 mg total)  by mouth 3 (three) times daily. Patient taking differently: Take 600 mg by mouth at bedtime.  12/02/13  Yes Hannah Muthersbaugh, PA-C  glipiZIDE (GLUCOTROL) 5 MG tablet Take 5 mg by mouth daily before breakfast. Reported on 07/05/2015   Yes Historical Provider, MD  HYDROcodone-acetaminophen (NORCO) 5-325 MG tablet Take 2 tablets by mouth every 4 (four) hours as needed. Patient taking differently: Take 2 tablets by mouth every 4 (four) hours as needed for moderate pain.  10/22/15  Yes Mancel Bale, MD  insulin glargine (LANTUS) 100 UNIT/ML injection Inject 40 Units into the skin daily. Reported on 07/05/2015   Yes Historical Provider, MD  lisinopril (PRINIVIL,ZESTRIL) 5 MG tablet Take 5 mg by mouth daily.   Yes Historical Provider, MD  metFORMIN (GLUCOPHAGE) 500 MG tablet Take 1,000 mg by mouth 2 (two) times daily with a meal. Reported on 07/05/2015   Yes Historical Provider, MD  omeprazole (PRILOSEC) 20 MG capsule Take 20 mg by mouth daily. Reported on 07/05/2015   Yes Historical Provider, MD  ondansetron (ZOFRAN) 4 MG tablet Take 4 mg by mouth every 8 (eight) hours as needed for nausea or vomiting.   Yes Historical Provider, MD  oxyCODONE-acetaminophen (PERCOCET) 10-325 MG tablet Take 1-2 tablets by mouth every 4 (four) hours as needed for pain.   Yes Historical Provider, MD  promethazine (PHENERGAN) 25 MG tablet Take 25 mg by mouth every 6 (six) hours as needed for nausea or vomiting.  Yes Historical Provider, MD  sertraline (ZOLOFT) 50 MG tablet Take 100 mg by mouth daily. Reported on 07/05/2015   Yes Historical Provider, MD  traZODone (DESYREL) 50 MG tablet Take 50 mg by mouth daily as needed for sleep. Reported on 07/05/2015   Yes Historical Provider, MD    Family History No family history on file.  Social History Social History  Substance Use Topics  . Smoking status: Current Every Day Smoker    Packs/day: 0.50    Types: Cigarettes  . Smokeless tobacco: Not on file  . Alcohol use No      Allergies   Patient has no known allergies.   Review of Systems Review of Systems   Physical Exam Updated Vital Signs BP (!) 107/54   Pulse 91   Temp 98.6 F (37 C) (Oral)   Resp 17   Ht 5\' 6"  (1.676 m)   Wt 56.7 kg   SpO2 98%   BMI 20.18 kg/m   Physical Exam  Constitutional: He appears well-developed and well-nourished. No distress.  HENT:  Head: Normocephalic and atraumatic.  Eyes: Conjunctivae are normal. No scleral icterus.  Neck: Normal range of motion. Neck supple.  Cardiovascular: Normal rate, regular rhythm and normal heart sounds.   Pulmonary/Chest: Effort normal and breath sounds normal. No respiratory distress.  Abdominal: Soft. There is no tenderness.  Musculoskeletal: He exhibits no edema.  Neurological: He is alert.  Speech is clear and goal oriented, follows commands Major Cranial nerves without deficit, no facial droop Normal strength in upper and lower extremities bilaterally including dorsiflexion and plantar flexion, strong and equal grip strength Sensation normal to light and sharp touch Moves extremities without ataxia, coordination intact Normal finger to nose and rapid alternating movements Neg romberg, no pronator drift Normal gait Normal heel-shin and balance   Skin: Skin is warm and dry. He is not diaphoretic.  Psychiatric: His behavior is normal.  Nursing note and vitals reviewed.    ED Treatments / Results  Labs (all labs ordered are listed, but only abnormal results are displayed) Labs Reviewed  CBC - Abnormal; Notable for the following:       Result Value   WBC 12.3 (*)    Hemoglobin 12.5 (*)    HCT 38.1 (*)    All other components within normal limits  URINALYSIS, ROUTINE W REFLEX MICROSCOPIC  RAPID URINE DRUG SCREEN, HOSP PERFORMED  ETHANOL  BASIC METABOLIC PANEL  CBG MONITORING, ED  I-STAT CHEM 8, ED  I-STAT TROPOININ, ED  POCT CBG (FASTING - GLUCOSE)-MANUAL ENTRY  CBG MONITORING, ED    EKG  EKG  Interpretation  Date/Time:  Wednesday April 13 2016 22:51:20 EDT Ventricular Rate:  92 PR Interval:    QRS Duration: 78 QT Interval:  367 QTC Calculation: 454 R Axis:   69 Text Interpretation:  Sinus rhythm Minimal ST elevation, anterior leads No significant change since last tracing Confirmed by ZACKOWSKI  MD, SCOTT ([id number]) on 04/13/2016 10:54:16 PM       Radiology No results found.  Procedures Procedures (including critical care time)  Medications Ordered in ED Medications  sodium chloride 0.9 % bolus 1,000 mL (1,000 mLs Intravenous New Bag/Given 04/14/16 0013)    And  0.9 %  sodium chloride infusion ( Intravenous New Bag/Given 04/14/16 0013)     Initial Impression / Assessment and Plan / ED Course  I have reviewed the triage vital signs and the nursing notes.  Pertinent labs & imaging results that were available during my  care of the patient were reviewed by me and considered in my medical decision making (see chart for details).     61 year old male with history of hyperglycemia, urine is cocaine positive. Syncopal event today, but orthostatics are negative. His labs are reassuring. Hyperglycemia has improved with fluids. CT negative, his EKG is unchanged. Patient is ambulatory in the emergency department. He's had a history of previous syncopal events. Her safe for discharge at this time. Follow-up with his PCP in the next 24-48 hours.  Final Clinical Impressions(s) / ED Diagnoses   Final diagnoses:  Syncope and collapse    New Prescriptions New Prescriptions   No medications on file     Arthor Captainbigail Ardit Danh, PA-C 04/14/16 [id number]    Gilda Creasehristopher J Pollina, MD 04/14/16 0744    Arthor CaptainAbigail Ruvi Fullenwider, PA-C 04/27/16 1507    Gilda Creasehristopher J Pollina, MD 05/05/16 [phone]

## 2016-04-14 NOTE — ED Notes (Signed)
Pt brother called Fayrene Fearing([name]) states he will pick pt up

## 2016-04-14 NOTE — ED Notes (Signed)
Pt ambulated self efficiently to restroom with no difficulty. 

## 2016-04-14 NOTE — ED Notes (Signed)
CBG is 298.

## 2016-04-14 NOTE — Discharge Instructions (Signed)
Get help right away if: You have a severe headache. You have unusual pain in your chest, abdomen, or back. You are bleeding from your mouth or rectum, or you have black or tarry stool. You have a very fast or irregular heartbeat (palpitations). You have pain with breathing. You faint once or repeatedly. You have a seizure. You are confused. You have trouble walking. You have severe weakness. You have vision problem

## 2016-08-26 ENCOUNTER — Encounter (HOSPITAL_COMMUNITY): Payer: Self-pay | Admitting: *Deleted

## 2016-08-26 ENCOUNTER — Emergency Department (HOSPITAL_COMMUNITY)
Admission: EM | Admit: 2016-08-26 | Discharge: 2016-08-27 | Disposition: A | Discharge status: Home / Self Care | Payer: Non-veteran care | Attending: Emergency Medicine | Admitting: Emergency Medicine

## 2016-08-26 DIAGNOSIS — F17210 Nicotine dependence, cigarettes, uncomplicated: Secondary | ICD-10-CM | POA: Insufficient documentation

## 2016-08-26 DIAGNOSIS — F329 Major depressive disorder, single episode, unspecified: Secondary | ICD-10-CM | POA: Insufficient documentation

## 2016-08-26 DIAGNOSIS — E119 Type 2 diabetes mellitus without complications: Secondary | ICD-10-CM | POA: Insufficient documentation

## 2016-08-26 DIAGNOSIS — R45851 Suicidal ideations: Secondary | ICD-10-CM | POA: Insufficient documentation

## 2016-08-26 LAB — ACETAMINOPHEN LEVEL: Acetaminophen (Tylenol), Serum: 15 ug/mL (ref 10–30)

## 2016-08-26 LAB — SALICYLATE LEVEL

## 2016-08-26 LAB — URINALYSIS, ROUTINE W REFLEX MICROSCOPIC
Bacteria, UA: NONE SEEN
Bilirubin Urine: NEGATIVE
Glucose, UA: >=500 mg/dL — AB
Hgb urine dipstick: NEGATIVE
Ketones, ur: NEGATIVE mg/dL
Leukocytes, UA: NEGATIVE
Nitrite: NEGATIVE
Protein, ur: NEGATIVE mg/dL
Specific Gravity, Urine: 1.03 (ref 1.005–1.030)
Squamous Epithelial / LPF: NONE SEEN
pH: 5 (ref 5.0–8.0)

## 2016-08-26 LAB — COMPREHENSIVE METABOLIC PANEL
ALT: 18 U/L (ref 17–63)
AST: 21 U/L (ref 15–41)
Albumin: 3.5 g/dL (ref 3.5–5.0)
Alkaline Phosphatase: 50 U/L (ref 38–126)
Anion gap: 9 (ref 5–15)
BILIRUBIN TOTAL: 0.9 mg/dL (ref 0.3–1.2)
BUN: 14 mg/dL (ref 6–20)
CHLORIDE: 98 mmol/L — AB (ref 101–111)
CO2: 25 mmol/L (ref 22–32)
CREATININE: 0.89 mg/dL (ref 0.61–1.24)
Calcium: 8.9 mg/dL (ref 8.9–10.3)
Glucose, Bld: 454 mg/dL — ABNORMAL HIGH (ref 65–99)
POTASSIUM: 4.6 mmol/L (ref 3.5–5.1)
Sodium: 132 mmol/L — ABNORMAL LOW (ref 135–145)
TOTAL PROTEIN: 6.9 g/dL (ref 6.5–8.1)

## 2016-08-26 LAB — CBC
HCT: 40.4 % (ref 39.0–52.0)
Hemoglobin: 13.7 g/dL (ref 13.0–17.0)
MCH: 28.8 pg (ref 26.0–34.0)
MCHC: 33.9 g/dL (ref 30.0–36.0)
MCV: 84.9 fL (ref 78.0–100.0)
Platelets: 283 10*3/uL (ref 150–400)
RBC: 4.76 MIL/uL (ref 4.22–5.81)
RDW: 12.8 % (ref 11.5–15.5)
WBC: 8.9 10*3/uL (ref 4.0–10.5)

## 2016-08-26 LAB — RAPID URINE DRUG SCREEN, HOSP PERFORMED
AMPHETAMINES: NOT DETECTED
BARBITURATES: NOT DETECTED
BENZODIAZEPINES: NOT DETECTED
Cocaine: POSITIVE — AB
Opiates: NOT DETECTED
Tetrahydrocannabinol: NOT DETECTED

## 2016-08-26 LAB — ETHANOL

## 2016-08-26 MED ORDER — ALUM & MAG HYDROXIDE-SIMETH 200-200-20 MG/5ML PO SUSP: 30.0000 mL | Freq: Four times a day (QID) | ORAL | Status: DC | PRN

## 2016-08-26 MED ORDER — LORAZEPAM 2 MG/ML IJ SOLN: 0.0000 mg | Freq: Four times a day (QID) | INTRAMUSCULAR | Status: DC

## 2016-08-26 MED ORDER — LORAZEPAM 2 MG/ML IJ SOLN
0.0000 mg | Freq: Two times a day (BID) | INTRAMUSCULAR | Status: DC
Start: 2016-08-28 — End: 2016-08-27

## 2016-08-26 MED ORDER — GABAPENTIN 300 MG PO CAPS
600.0000 mg | ORAL_CAPSULE | Freq: Every day | ORAL | Status: DC
  Administered 2016-08-26: 600 mg via ORAL
  Filled 2016-08-26: qty 2

## 2016-08-26 MED ORDER — ZOLPIDEM TARTRATE 5 MG PO TABS
5.0000 mg | ORAL_TABLET | Freq: Every evening | ORAL | Status: DC | PRN
  Administered 2016-08-26: 5 mg via ORAL
  Filled 2016-08-26: qty 1

## 2016-08-26 MED ORDER — INSULIN GLARGINE 100 UNIT/ML ~~LOC~~ SOLN
40.0000 [IU] | Freq: Every day | SUBCUTANEOUS | Status: AC
  Administered 2016-08-26 – 2016-08-27 (×2): 40 [IU] via SUBCUTANEOUS
  Filled 2016-08-26 (×3): qty 0.4

## 2016-08-26 MED ORDER — METFORMIN HCL 500 MG PO TABS
500.0000 mg | ORAL_TABLET | Freq: Two times a day (BID) | ORAL | Status: DC
  Administered 2016-08-26 – 2016-08-27 (×2): 500 mg via ORAL
  Filled 2016-08-26 (×2): qty 1

## 2016-08-26 MED ORDER — LORAZEPAM 1 MG PO TABS: 0.0000 mg | ORAL_TABLET | Freq: Four times a day (QID) | ORAL | Status: DC

## 2016-08-26 MED ORDER — ONDANSETRON HCL 4 MG PO TABS: 4.0000 mg | ORAL_TABLET | Freq: Three times a day (TID) | ORAL | Status: DC | PRN

## 2016-08-26 MED ORDER — VITAMIN B-1 100 MG PO TABS
100.0000 mg | ORAL_TABLET | Freq: Every day | ORAL | Status: DC
  Administered 2016-08-26 – 2016-08-27 (×2): 100 mg via ORAL
  Filled 2016-08-26 (×2): qty 1

## 2016-08-26 MED ORDER — IBUPROFEN 400 MG PO TABS: 600.0000 mg | ORAL_TABLET | Freq: Three times a day (TID) | ORAL | Status: DC | PRN

## 2016-08-26 MED ORDER — THIAMINE HCL 100 MG/ML IJ SOLN: 100.0000 mg | Freq: Every day | INTRAMUSCULAR | Status: DC

## 2016-08-26 MED ORDER — LORAZEPAM 1 MG PO TABS: 0.0000 mg | ORAL_TABLET | Freq: Two times a day (BID) | ORAL | Status: DC

## 2016-08-26 MED ORDER — NICOTINE 21 MG/24HR TD PT24
21.0000 mg | MEDICATED_PATCH | Freq: Every day | TRANSDERMAL | Status: DC
  Administered 2016-08-26 – 2016-08-27 (×2): 21 mg via TRANSDERMAL
  Filled 2016-08-26 (×2): qty 1

## 2016-08-26 NOTE — ED Notes (Signed)
Snack and drink provided for patient

## 2016-08-26 NOTE — ED Provider Notes (Signed)
[facility] Emergency Department Provider Note  ED Clinical Impression   Suicidal ideation   History   Chief Complaint Suicidal and Medical Clearance   HPI  Patient is a 61 y.o. male with a PMH of depression and DM who presents to ED for suicidal ideations, onset this morning, denies plan. Denies HI, AH, or VH. Denies previous suicide attempts. States he was previously on zoloft for his depression but has not taken in approx 3 months. +tobacco use. +alcohol use, states he drank "a 40oz at 2am this morning", +cocaine use at 2am today. Denies hx of alcohol withdrawal or seizures. Denies fevers, chills, unexplained weight loss, dizziness, headaches, neck pain, vision or gait changes, CP, SOB, cough, abd pain, n/v/d, dysuria, extremity pain/swelling, or any additional concerns.   Past Medical History:  Diagnosis Date  . Carpal tunnel syndrome   . Depression   . Diabetes mellitus without complication (HCC)   . Neuropathy     Past Surgical History:  Procedure Laterality Date  . TESTICLE TORSION REDUCTION      Current Outpatient Rx  . Order #: [id number] Class: Historical Med  . Order #: [id number] Class: Print  . Order #: [id number] Class: Historical Med  . Order #: [id number] Class: Print  . Order #: [id number] Class: Historical Med  . Order #: [id number] Class: Historical Med  . Order #: [id number] Class: Historical Med  . Order #: [id number] Class: Historical Med  . Order #: [id number] Class: Historical Med  . Order #: [id number] Class: Historical Med  . Order #: [id number] Class: Historical Med  . Order #: [id number] Class: Historical Med  . Order #: [id number] Class: Historical Med    Allergies Patient has no known allergies.  History reviewed. No pertinent family history.  Social History Social History  Substance Use Topics  . Smoking status: Current Every Day Smoker    Packs/day: 0.50    Types: Cigarettes  . Smokeless tobacco: Not on file  . Alcohol use Yes    Review of  Systems  Constitutional: Negative for fever, chills, or unexplained weight loss. Eyes: Negative for visual changes. Cardiovascular: Negative for chest pain, palpitations, or extremity swelling. Respiratory: Negative for shortness of breath or cough. Gastrointestinal: Negative for abdominal pain, nausea, vomiting, or diarrhea. Genitourinary: Negative for dysuria or hematuria. Musculoskeletal: Negative for back pain or extremity pain/swelling. Skin: Negative for rash. Neurological: Negative for headaches, dizziness, focal weakness, or numbness/tingling. Psych: +SI. Negative for HI, AH, VH.   Physical Exam   VITAL SIGNS:   ED Triage Vitals  Enc Vitals Group     BP 08/26/16 1037 129/67     Pulse Rate 08/26/16 1037 88     Resp 08/26/16 1037 18     Temp 08/26/16 1037 98.3 F (36.8 C)     Temp Source 08/26/16 1037 Oral     SpO2 08/26/16 1037 98 %     Weight --      Height --      Head Circumference --      Peak Flow --      Pain Score 08/26/16 1035 10     Pain Loc --      Pain Edu? --      Excl. in GC? --     Constitutional: Alert and oriented. Well appearing and in no respiratory apparent distress. Eyes: PERRL, EOMI, Conjunctivae normal.  ENT      Head: Normocephalic and atraumatic.      Ears: TM intact bilaterally without erythema or effusion, no hemotympanum, external ear canals normal.  Mouth/Throat: Mucous membranes are moist. Oropharynx without erythema or exudate. No trismus. Normal voice, handling secretions normally.      Neck: Supple, no nuchal signs, no cervical midline tenderness, full active ROM of neck.  Cardiovascular: Normal S1 S2, regular rhythm, normal rate. Normal and symmetric distal pulses are present in all extremities. Respiratory: Breath sounds clear and equal bilaterally. No wheezes, rales, or rhonchi. Normal respiratory effort.  Gastrointestinal: Abdomen soft and nontender. No rebound or guarding. There is no CVA tenderness. Back: No midline  tenderness.  Musculoskeletal: Nontender with normal range of motion in all extremities. Neurologic: Speech clear. Alert and appropriate, no gross focal neurologic deficits are appreciated. Gait steady with ambulation. Equal strength in all four extremities. Extremities neurovascularly intact.  Skin: Skin is warm, dry, and intact. No rash noted. Psychiatric: +SI. Denies HI, AH, or VH. Speech clear, calm and cooperative. Mood and affect are normal.   Labs   Labs Reviewed  COMPREHENSIVE METABOLIC PANEL - Abnormal; Notable for the following:       Result Value   Sodium 132 (*)    Chloride 98 (*)    Glucose, Bld 454 (*)    All other components within normal limits  ETHANOL  SALICYLATE LEVEL  ACETAMINOPHEN LEVEL  CBC  RAPID URINE DRUG SCREEN, HOSP PERFORMED    Radiology   No orders to display     ED Course, Assessment and Plan   Patient is a 61 y/o M, afebrile, who presents to ED for suicidal ideations. Patient oriented and with normal mental status. Doubt delirium, toxic ingestion, meningitis, encephalitis, intracranial hemorrhage, metabolic syndrome, hypoglycemia, sepsis, or withdrawal at this time. Will get labs and refer for Behavioral Health evaluation.  1:34 PM Glu 454. Patient states he was previously on lantus 40units during day and metformin 500mg  BID but has not taken for approx 1 month. Pharmacy tech at bedside doing med rec; will plan to re-order home meds  Previous chart, nursing notes, and vital signs reviewed.    Pertinent labs & imaging results that were available during my care of the patient were reviewed by me and considered in my medical decision making (see chart for details).     Maurie Olesen, Hinton Dyerobyn K, NP 08/30/16 1459    Marily MemosMesner, Jason, MD 08/30/16 [phone]

## 2016-08-26 NOTE — BH Assessment (Signed)
[facility] Assessment Progress Note    Case was staffed with [place] NP who recommended patient be re-evaluated in the a.m.     

## 2016-08-26 NOTE — ED Notes (Signed)
Pt requested to use phone. RN agreed. Pt went to desk and used phone

## 2016-08-26 NOTE — ED Notes (Signed)
Dinner tray ordered-carb modified 

## 2016-08-26 NOTE — ED Triage Notes (Signed)
Pt reports being depressed and afraid he is going to harm himself. Denies any attempts. Denies hallucinations. Calm and cooperative at triage. Has a headache. Reports etoh and cocaine use, last used yesterday.

## 2016-08-26 NOTE — ED Notes (Signed)
MD at bedside. 

## 2016-08-26 NOTE — ED Notes (Signed)
MD notified of patients request for a sleep aid and the continuance of the patients home medications. MD stated he will speak with the patient and place appropriate orders

## 2016-08-26 NOTE — BH Assessment (Addendum)
Assessment Note  William Acevedo is an 61 y.o. male that presents this date with passive thoughts of self harm. Patient states that he has no SI intention or plan. Patient denies any previous attempts or gestures at self harm. Patient states he currently receives services from the [facility] in [place] but has not seen that provider in over two months. Patient states he was diagnosed with MDD in 2001 and has been "off and on" medications since then. Patient cannot recall the last time he received any medications for his depression but reports ongoing symptoms to include: feeling worthless and hopeless. Patient reports a ongoing SA history to include daily use of cocaine (crack) and daily alcohol use. Patient reports using up to 1 gram of cocaine a day with last reported use on 08/25/16 when patient reported using one gram. Patient states he drinks "a few beers every day" but is vague in reference to use/amount. Patient reports current withdrawals to include agitation and tremors. Patient reports multiple inpatient admissions to assist with SA issues with the last one in 2017 when patient reported he stayed for one week at the [facility] in [place]. Per history review patient was last seen at [facility] on 09/04/14 under IVC for SA issues. Patient denies any current H/I or AVH. Patient is oriented to time/place and denies any current legal. Patient denies any previous attempts/gestures at self harm but cannot contract for safety this date stating "I am not sure what I would do if I left." Patient states he has multiple stressors to include: homelessness, financial issues and excessive SA use. Case was staffed with Beryle Lathe NP who recommended patient be re-evaluated in the a.m.      Diagnosis: MDD recurrent without psychotic features, severe Polysubstance abuse  Past Medical History:  Past Medical History:  Diagnosis Date  . Carpal tunnel syndrome   . Depression   . Diabetes mellitus without complication (HCC)   .  Neuropathy     Past Surgical History:  Procedure Laterality Date  . TESTICLE TORSION REDUCTION      Family History: History reviewed. No pertinent family history.  Social History:  reports that he has been smoking Cigarettes.  He has been smoking about 0.50 packs per day. He does not have any smokeless tobacco history on file. He reports that he drinks alcohol. He reports that he uses drugs, including Cocaine.  Additional Social History:  Alcohol / Drug Use Pain Medications: See PTA medication list Prescriptions: See PTA medication list Over the Counter: See PTA medication list History of alcohol / drug use?: Yes Longest period of sobriety (when/how long): Unknown Negative Consequences of Use: Personal relationships Withdrawal Symptoms: Agitation, Irritability Substance #1 Name of Substance 1: Cocaine (Crack)  1 - Age of First Use: 44 1 - Amount (size/oz): 1 gram 1 - Frequency: Daily use 1 - Duration: Last year 1 - Last Use / Amount: 08/25/16 1 gram Substance #2 Name of Substance 2: Alcohol 2 - Age of First Use: unknown 2 - Amount (size/oz): Varies 2 - Frequency: May use more if he is drinking. 2 - Duration: On-going 2 - Last Use / Amount: 08/25/16 Unknown amount  CIWA: CIWA-Ar BP: 129/67 Pulse Rate: 88 COWS:    Allergies: No Known Allergies  Home Medications:  (Not in a hospital admission)  OB/GYN Status:  No LMP for male patient.  General Assessment Data Location of Assessment: [facility] ED TTS Assessment: In system Is this a Tele or Face-to-Face Assessment?: Face-to-Face Is this an  Initial Assessment or a Re-assessment for this encounter?: Initial Assessment Marital status: Married [place] name: NA Is patient pregnant?: No Pregnancy Status: No Living Arrangements: Alone Can pt return to current living arrangement?: Yes Admission Status: Voluntary Is patient capable of signing voluntary admission?: Yes Referral Source: Self/Family/Friend [facility]:  [facility]  Medical Screening Exam [facility] Walk-in ONLY) Medical Exam completed: Yes  Crisis Care Plan Living Arrangements: Alone Legal Guardian:  (NA) [place] [place] [facility] [place]  Education Status Is patient currently in school?: No Current Grade:  (NA) Highest grade of school patient has completed:  (12th) Name of school:  (NA) Contact person:  (NA)  Risk to self with the past 6 months Suicidal Ideation: Yes-Currently Present Has patient been a risk to self within the past 6 months prior to admission? : No Suicidal Intent: No Has patient had any suicidal intent within the past 6 months prior to admission? : No Is patient at risk for suicide?: Yes Suicidal Plan?: No Has patient had any suicidal plan within the past 6 months prior to admission? : Yes Access to Means: No What has been your use of drugs/alcohol within the last 12 months?: Current use Previous Attempts/Gestures: No How many times?: 0 Other Self Harm Risks: NA Triggers for Past Attempts: Unknown Intentional Self Injurious Behavior: None Family Suicide History: No Recent stressful life event(s): Other (Comment) (Homeless) Persecutory voices/beliefs?: No Depression: Yes Depression Symptoms: Feeling worthless/self pity Substance abuse history and/or treatment for substance abuse?: Yes Suicide prevention information given to non-admitted patients: Not applicable  Risk to Others within the past 6 months Homicidal Ideation: No Does patient have any lifetime risk of violence toward others beyond the six months prior to admission? : No Thoughts of Harm to Others: No Current Homicidal Intent: No Current Homicidal Plan: No Access to Homicidal Means: No Identified Victim: NA History of harm to others?: No Assessment of Violence: None Noted Violent Behavior Description: NA Does patient have access to weapons?: No Criminal Charges Pending?: No Does patient have a court date:  No Is patient on probation?: No  Psychosis Hallucinations: None noted Delusions: None noted  Mental Status Report Appearance/Hygiene: In scrubs Eye Contact: Fair Motor Activity: Freedom of movement Speech: Logical/coherent Level of Consciousness: Alert Mood: Depressed Affect: Appropriate to circumstance Anxiety Level: Minimal Thought Processes: Coherent, Relevant Judgement: Unimpaired Orientation: Person, Place, Time Obsessive Compulsive Thoughts/Behaviors: None  Cognitive Functioning Concentration: Good Memory: Recent Intact, Remote Intact IQ: Average Insight: Fair Impulse Control: Poor Appetite: Fair Weight Loss: 0 Weight Gain: 0 Sleep: Decreased Total Hours of Sleep: 7 Vegetative Symptoms: None  ADLScreening [facility] Assessment Services) Patient's cognitive ability adequate to safely complete daily activities?: Yes Patient able to express need for assistance with ADLs?: Yes Independently performs ADLs?: Yes (appropriate for developmental age)  Prior Inpatient Therapy Prior Inpatient Therapy: Yes Prior Therapy Dates: 2017 [facility] Reason [facility]: SA issues  Prior Outpatient Therapy Prior Outpatient Therapy: Yes Prior Therapy Dates: 2017 [facility] [place] [facility] Reason for Treatment: MH issues Does patient have an ACCT team?: No Does patient have Intensive In-House Services?  : No Does patient have [facility] services? : No Does patient have P4CC services?: No  ADL Screening (condition at time of admission) Patient's cognitive ability adequate to safely complete daily activities?: Yes Is the patient deaf or have difficulty hearing?: No Does the patient have difficulty seeing, even when wearing glasses/contacts?: No Does the patient have difficulty concentrating,  remembering, or making decisions?: No Patient able to express need for assistance with ADLs?: Yes Does the patient have difficulty dressing  or bathing?: No Independently performs ADLs?: Yes (appropriate for developmental age) Does the patient have difficulty walking or climbing stairs?: No Weakness of Legs: None Weakness of Arms/Hands: None  Home Assistive Devices/Equipment Home Assistive Devices/Equipment: None  Therapy Consults (therapy consults require a physician order) PT Evaluation Needed: No SLP Evaluation Needed: No Abuse/Neglect Assessment (Assessment to be complete while patient is alone) Physical Abuse: Denies Verbal Abuse: Denies Sexual Abuse: Denies Exploitation of patient/patient's resources: Denies Self-Neglect: Denies Values / Beliefs Cultural Requests During Hospitalization: None Spiritual Requests During Hospitalization: None Consults Spiritual Care Consult Needed: No Social Work Consult Needed: No Merchant navy officer (For Healthcare) Does Patient Have a Medical Advance Directive?: No Would patient like information on creating a medical advance directive?: No - Patient declined    Additional Information 1:1 In Past 12 Months?: No CIRT Risk: No Elopement Risk: No Does patient have medical clearance?: Yes     Disposition: Case was staffed with Beryle Lathe NP who recommended patient be re-evaluated in the a.m.   Disposition Initial Assessment Completed for this Encounter: Yes Disposition of Patient: Other dispositions  On Site Evaluation by:   Reviewed with Physician:    Alfredia Ferguson 08/26/2016 1:09 PM

## 2016-08-26 NOTE — ED Notes (Signed)
Pt  Up to BR to void

## 2016-08-27 DIAGNOSIS — F332 Major depressive disorder, recurrent severe without psychotic features: Secondary | ICD-10-CM | POA: Diagnosis not present

## 2016-08-27 DIAGNOSIS — F1411 Cocaine abuse, in remission: Secondary | ICD-10-CM

## 2016-08-27 DIAGNOSIS — F419 Anxiety disorder, unspecified: Secondary | ICD-10-CM

## 2016-08-27 DIAGNOSIS — F17210 Nicotine dependence, cigarettes, uncomplicated: Secondary | ICD-10-CM | POA: Diagnosis not present

## 2016-08-27 DIAGNOSIS — F1910 Other psychoactive substance abuse, uncomplicated: Secondary | ICD-10-CM | POA: Diagnosis not present

## 2016-08-27 LAB — CBG MONITORING, ED: GLUCOSE-CAPILLARY: 70 mg/dL (ref 65–99)

## 2016-08-27 NOTE — ED Notes (Signed)
EKG performed from order of 08/26/16 - given to Dr Ethelda ChickJacubowitz.

## 2016-08-27 NOTE — ED Notes (Addendum)
BS checked per pt request.  BS 70. Pt given peanut butter crackers

## 2016-08-27 NOTE — ED Notes (Signed)
Telepsych being performed. 

## 2016-08-27 NOTE — ED Provider Notes (Addendum)
Resting comfortably. Denies any complaint.   Date: 08/27/2016 908 am  Rate: 85  Rhythm: normal sinus rhythm  QRS Axis: normal  Intervals: normal  ST/T Wave abnormalities: normal  Conduction Disutrbances: none  Narrative Interpretation: unremarkable       Doug SouJacubowitz, Zacharee Gaddie, MD 08/27/16 0917 12:25 PM patient is alert Glasgow Coma Score 15 no distress and ambulates without difficulty denies point to harm himself or others. Stable for discharge and [facility] [place] Results for orders placed or performed during the hospital encounter of 08/26/16  Comprehensive metabolic panel  Result Value Ref Range   Sodium 132 (L) 135 - 145 mmol/L   Potassium 4.6 3.5 - 5.1 mmol/L   Chloride 98 (L) 101 - 111 mmol/L   CO2 25 22 - 32 mmol/L   Glucose, Bld 454 (H) 65 - 99 mg/dL   BUN 14 6 - 20 mg/dL   Creatinine, Ser 8.290.89 0.61 - 1.24 mg/dL   Calcium 8.9 8.9 - 56.210.3 mg/dL   Total Protein 6.9 6.5 - 8.1 g/dL   Albumin 3.5 3.5 - 5.0 g/dL   AST 21 15 - 41 U/L   ALT 18 17 - 63 U/L   Alkaline Phosphatase 50 38 - 126 U/L   Total Bilirubin 0.9 0.3 - 1.2 mg/dL   GFR calc non Af Amer >60 >60 mL/min   GFR calc Af Amer >60 >60 mL/min   Anion gap 9 5 - 15  Ethanol  Result Value Ref Range   Alcohol, Ethyl (B) <5 <5 mg/dL  Salicylate level  Result Value Ref Range   Salicylate Lvl <7.0 2.8 - 30.0 mg/dL  Acetaminophen level  Result Value Ref Range   Acetaminophen (Tylenol), Serum 15 10 - 30 ug/mL  cbc  Result Value Ref Range   WBC 8.9 4.0 - 10.5 K/uL   RBC 4.76 4.22 - 5.81 MIL/uL   Hemoglobin 13.7 13.0 - 17.0 g/dL   HCT 13.040.4 86.539.0 - 78.452.0 %   MCV 84.9 78.0 - 100.0 fL   MCH 28.8 26.0 - 34.0 pg   MCHC 33.9 30.0 - 36.0 g/dL   RDW 69.612.8 29.511.5 - 28.415.5 %   Platelets 283 150 - 400 K/uL  Rapid urine drug screen (hospital performed)  Result Value Ref Range   Opiates NONE DETECTED NONE DETECTED   Cocaine POSITIVE (A) NONE DETECTED   Benzodiazepines NONE DETECTED NONE DETECTED   Amphetamines NONE DETECTED NONE DETECTED   Tetrahydrocannabinol NONE DETECTED NONE DETECTED   Barbiturates NONE DETECTED NONE DETECTED  Urinalysis, Routine w reflex microscopic  Result Value Ref Range   Color, Urine YELLOW YELLOW   APPearance CLEAR CLEAR   Specific Gravity, Urine 1.030 1.005 - 1.030   pH 5.0 5.0 - 8.0   Glucose, UA >=500 (A) NEGATIVE mg/dL   Hgb urine dipstick NEGATIVE NEGATIVE   Bilirubin Urine NEGATIVE NEGATIVE   Ketones, ur NEGATIVE NEGATIVE mg/dL   Protein, ur NEGATIVE NEGATIVE mg/dL   Nitrite NEGATIVE NEGATIVE   Leukocytes, UA NEGATIVE NEGATIVE   RBC / HPF 0-5 0 - 5 RBC/hpf   WBC, UA 0-5 0 - 5 WBC/hpf   Bacteria, UA NONE SEEN NONE SEEN   Squamous Epithelial / LPF NONE SEEN NONE SEEN  CBG monitoring, ED  Result Value Ref Range   Glucose-Capillary 70 65 - 99 mg/dL   No results found.   Doug SouJacubowitz, Adron Geisel, MD 08/27/16 1230

## 2016-08-27 NOTE — ED Notes (Signed)
Pt ambulatory to shower w/Sitter. 

## 2016-08-27 NOTE — ED Notes (Signed)
ALL belongings returned to pt - 1 labeled belongings bag and 1 valuables envelope - Pt signed verifying all items present. Pt given outpt resources and [facility] [place] - Voiced understanding.

## 2016-08-27 NOTE — ED Notes (Signed)
Pt eating lunch d/t asked if he could eat lunch before he left. Advised pt yes as pt has been calm, cooperative, and pleasant.

## 2016-08-27 NOTE — Consult Note (Signed)
Telepsych Consultation   Reason for Consult: Depression and SI Referring Physician:  EDP Patient Identification: William Acevedo MRN:  [id number] Principal Diagnosis: <principal problem not specified> Diagnosis:   Patient Active Problem List   Diagnosis Date Noted  . Overdose [T50.901A] 07/06/2015  . AKI (acute kidney injury) ([place]) [N17.9] 07/06/2015  . Polysubstance abuse [F19.10] 07/06/2015  . DM2 (diabetes mellitus, type 2) ([place]) [E11.9] 07/06/2015  . Dehydration [E86.0]   . MDD (major depressive disorder), recurrent episode, mild ([place]) [F33.0]   . Uncomplicated alcohol dependence ([place]) [F10.20]     Total Time spent with patient: 30 minutes  Subjective:   William Acevedo is a 61 y.o. male patient admitted with MDD recurrent without psychotic features, severe Polysubstance abuse.  HPI: Per the assessment completed by Viviana Simpler on 08/26/16: William Acevedo is an 61 y.o. male that presents this date with passive thoughts of self harm. Patient states that he has no SI intention or plan. Patient denies any previous attempts or gestures at self harm. Patient states he currently receives services from the [facility] in [place] but has not seen that provider in over two months. Patient states he was diagnosed with MDD in 2001 and has been "off and on" medications since then. Patient cannot recall the last time he received any medications for his depression but reports ongoing symptoms to include: feeling worthless and hopeless. Patient reports a ongoing SA history to include daily use of cocaine (crack) and daily alcohol use. Patient reports using up to 1 gram of cocaine a day with last reported use on 08/25/16 when patient reported using one gram. Patient states he drinks "a few beers every day" but is vague in reference to use/amount. Patient reports current withdrawals to include agitation and tremors. Patient reports multiple inpatient admissions to assist with SA issues with the  last one in 2017 when patient reported he stayed for one week at the [facility] in [place]. Per history review patient was last seen at [facility] on 09/04/14 under IVC for SA issues. Patient denies any current H/I or AVH. Patient is oriented to time/place and denies any current legal. Patient denies any previous attempts/gestures at self harm but cannot contract for safety this date stating "I am not sure what I would do if I left." Patient states he has multiple stressors to include: homelessness, financial issues and excessive SA use. Case was staffed with Lu Duffel NP who recommended patient be re-evaluated in the a.m.  On Exam: Patient was seen via tele-psych, chart reviewed with treatment team. Patient in bed, awake, alert and oriented x4. Patient reiterated the reason for this hospital admission as documented above. Patient stated, "I came here for depression and suicide ideations". Patient stated, but now, I'm no longer suicidal. I only have those thoughts when high with drugs. Patient stated that he is ready to come off of drugs. He stated that he has been using drugs for 4 years and is now ready to stop. Patient reported that he has high energy from anxiety. He stated that he needs help to get back on his meds, to gain weight and to get his mind right. Patient currently denies any SI/HI/VAH, he said he sometimes have [place] when he is using drugs. Patient stated that drug usage makes him feel helpless and worthless. Patient stated that he is willing and ready to get help. He agrees to follow up with OP provider to get back on depression medications. He stated that he was on Zoloft  for many years but it's no longer effective. Patient stated that he has good support system, that his brother is invested in his treatment and is ready to help. He also stated that his son is also willing to help. Patient contracts for safety upon discharge and will be provided with OP resources as requested. Patient currently lives with his  brother and sister and able to return home.   Past Psychiatric History: See H&P  Risk to Self: Suicidal Ideation: Yes-Currently Present Suicidal Intent: No Is patient at risk for suicide?: Yes Suicidal Plan?: No Access to Means: No What has been your use of drugs/alcohol within the last 12 months?: Current use How many times?: 0 Other Self Harm Risks: NA Triggers for Past Attempts: Unknown Intentional Self Injurious Behavior: None Risk to Others: Homicidal Ideation: No Thoughts of Harm to Others: No Current Homicidal Intent: No Current Homicidal Plan: No Access to Homicidal Means: No Identified Victim: NA History of harm to others?: No Assessment of Violence: None Noted Violent Behavior Description: NA Does patient have access to weapons?: No Criminal Charges Pending?: No Does patient have a court date: No Prior Inpatient Therapy: Prior Inpatient Therapy: Yes Prior Therapy Dates: 2017 [facility] issues Prior Outpatient Therapy: Prior Outpatient Therapy: Yes Prior Therapy Dates: 2017 Prior Therapy Facilty/Provider(s): [facility] Reason for Treatment: MH issues Does patient have an ACCT team?: No Does patient have Intensive In-House Services?  : No Does patient have [facility] services? : No Does patient have P4CC services?: No  Past Medical History:  Past Medical History:  Diagnosis Date  . Carpal tunnel syndrome   . Depression   . Diabetes mellitus without complication ([place])   . Neuropathy     Past Surgical History:  Procedure Laterality Date  . TESTICLE TORSION REDUCTION     Family History: History reviewed. No pertinent family history. Family Psychiatric  History:   Social History:  History  Alcohol Use  . Yes     History  Drug Use  . Types: Cocaine    Comment: 18  months clean    Social History   Social History  . Marital status: Married    Spouse name: N/A  . Number of children: N/A  .  Years of education: N/A   Social History Main Topics  . Smoking status: Current Every Day Smoker    Packs/day: 0.50    Types: Cigarettes  . Smokeless tobacco: None  . Alcohol use Yes  . Drug use: Yes    Types: Cocaine     Comment: 18  months clean  . Sexual activity: Not Asked   Other Topics Concern  . None   Social History Narrative  . None   Additional Social History:    Allergies:  No Known Allergies  Labs:  Results for orders placed or performed during the hospital encounter of 08/26/16 (from the past 48 hour(s))  Comprehensive metabolic panel     Status: Abnormal   Collection Time: 08/26/16 10:30 AM  Result Value Ref Range   Sodium 132 (L) 135 - 145 mmol/L   Potassium 4.6 3.5 - 5.1 mmol/L   Chloride 98 (L) 101 - 111 mmol/L   CO2 25 22 - 32 mmol/L   Glucose, Bld 454 (H) 65 - 99 mg/dL   BUN 14 6 - 20 mg/dL   Creatinine, Ser 0.89 0.61 - 1.24 mg/dL   Calcium 8.9 8.9 - 10.3 mg/dL   Total Protein 6.9 6.5 -  8.1 g/dL   Albumin 3.5 3.5 - 5.0 g/dL   AST 21 15 - 41 U/L   ALT 18 17 - 63 U/L   Alkaline Phosphatase 50 38 - 126 U/L   Total Bilirubin 0.9 0.3 - 1.2 mg/dL   GFR calc non Af Amer >60 >60 mL/min   GFR calc Af Amer >60 >60 mL/min    Comment: (NOTE) The eGFR has been calculated using the CKD EPI equation. This calculation has not been validated in all clinical situations. eGFR's persistently <60 mL/min signify possible Chronic Kidney Disease.    Anion gap 9 5 - 15  Ethanol     Status: None   Collection Time: 08/26/16 10:30 AM  Result Value Ref Range   Alcohol, Ethyl (B) <5 <5 mg/dL    Comment:        LOWEST DETECTABLE LIMIT FOR SERUM ALCOHOL IS 5 mg/dL FOR MEDICAL PURPOSES ONLY   Salicylate level     Status: None   Collection Time: 08/26/16 10:30 AM  Result Value Ref Range   Salicylate Lvl <4.9 2.8 - 30.0 mg/dL  Acetaminophen level     Status: None   Collection Time: 08/26/16 10:30 AM  Result Value Ref Range   Acetaminophen (Tylenol), Serum 15 10 -  30 ug/mL    Comment:        THERAPEUTIC CONCENTRATIONS VARY SIGNIFICANTLY. A RANGE OF 10-30 ug/mL MAY BE AN EFFECTIVE CONCENTRATION FOR MANY PATIENTS. HOWEVER, SOME ARE BEST TREATED AT CONCENTRATIONS OUTSIDE THIS RANGE. ACETAMINOPHEN CONCENTRATIONS >150 ug/mL AT 4 HOURS AFTER INGESTION AND >50 ug/mL AT 12 HOURS AFTER INGESTION ARE OFTEN ASSOCIATED WITH TOXIC REACTIONS.   cbc     Status: None   Collection Time: 08/26/16 10:30 AM  Result Value Ref Range   WBC 8.9 4.0 - 10.5 K/uL   RBC 4.76 4.22 - 5.81 MIL/uL   Hemoglobin 13.7 13.0 - 17.0 g/dL   HCT 40.4 39.0 - 52.0 %   MCV 84.9 78.0 - 100.0 fL   MCH 28.8 26.0 - 34.0 pg   MCHC 33.9 30.0 - 36.0 g/dL   RDW 12.8 11.5 - 15.5 %   Platelets 283 150 - 400 K/uL  Rapid urine drug screen (hospital performed)     Status: Abnormal   Collection Time: 08/26/16 11:45 AM  Result Value Ref Range   Opiates NONE DETECTED NONE DETECTED   Cocaine POSITIVE (A) NONE DETECTED   Benzodiazepines NONE DETECTED NONE DETECTED   Amphetamines NONE DETECTED NONE DETECTED   Tetrahydrocannabinol NONE DETECTED NONE DETECTED   Barbiturates NONE DETECTED NONE DETECTED    Comment:        DRUG SCREEN FOR MEDICAL PURPOSES ONLY.  IF CONFIRMATION IS NEEDED FOR ANY PURPOSE, NOTIFY LAB WITHIN 5 DAYS.        LOWEST DETECTABLE LIMITS FOR URINE DRUG SCREEN Drug Class       Cutoff (ng/mL) Amphetamine      1000 Barbiturate      200 Benzodiazepine   201 Tricyclics       007 Opiates          300 Cocaine          300 THC              50   Urinalysis, Routine w reflex microscopic     Status: Abnormal   Collection Time: 08/26/16 11:45 AM  Result Value Ref Range   Color, Urine YELLOW YELLOW   APPearance CLEAR CLEAR   Specific Gravity, Urine  1.030 1.005 - 1.030   pH 5.0 5.0 - 8.0   Glucose, UA >=500 (A) NEGATIVE mg/dL   Hgb urine dipstick NEGATIVE NEGATIVE   Bilirubin Urine NEGATIVE NEGATIVE   Ketones, ur NEGATIVE NEGATIVE mg/dL   Protein, ur NEGATIVE NEGATIVE  mg/dL   Nitrite NEGATIVE NEGATIVE   Leukocytes, UA NEGATIVE NEGATIVE   RBC / HPF 0-5 0 - 5 RBC/hpf   WBC, UA 0-5 0 - 5 WBC/hpf   Bacteria, UA NONE SEEN NONE SEEN   Squamous Epithelial / LPF NONE SEEN NONE SEEN  CBG monitoring, ED     Status: None   Collection Time: 08/27/16  5:58 AM  Result Value Ref Range   Glucose-Capillary 70 65 - 99 mg/dL    Current Facility-Administered Medications  Medication Dose Route Frequency Provider Last Rate Last Dose  . alum & mag hydroxide-simeth (MAALOX/MYLANTA) 200-200-20 MG/5ML suspension 30 mL  30 mL Oral Q6H PRN Mesner, Corene Cornea, MD      . gabapentin (NEURONTIN) capsule 600 mg  600 mg Oral QHS Nat Christen, MD   600 mg at 08/26/16 2108  . ibuprofen (ADVIL,MOTRIN) tablet 600 mg  600 mg Oral Q8H PRN Mesner, Corene Cornea, MD      . Derrill Memo ON 08/28/2016] LORazepam (ATIVAN) injection 0-4 mg  0-4 mg Intravenous Q12H Mesner, Corene Cornea, MD       Or  . Derrill Memo ON 08/28/2016] LORazepam (ATIVAN) tablet 0-4 mg  0-4 mg Oral Q12H Mesner, Corene Cornea, MD      . LORazepam (ATIVAN) injection 0-4 mg  0-4 mg Intravenous Q6H Long, Wonda Olds, MD       Or  . LORazepam (ATIVAN) tablet 0-4 mg  0-4 mg Oral Q6H Long, Wonda Olds, MD   Stopped at 08/27/16 0008  . metFORMIN (GLUCOPHAGE) tablet 500 mg  500 mg Oral BID WC Wojeck, Robyn K, NP   500 mg at 08/27/16 0751  . nicotine (NICODERM CQ - dosed in mg/24 hours) patch 21 mg  21 mg Transdermal Daily Mesner, Jason, MD   21 mg at 08/27/16 1043  . ondansetron (ZOFRAN) tablet 4 mg  4 mg Oral Q8H PRN Mesner, Corene Cornea, MD      . thiamine (VITAMIN B-1) tablet 100 mg  100 mg Oral Daily Mesner, Jason, MD   100 mg at 08/27/16 1040   Or  . thiamine (B-1) injection 100 mg  100 mg Intravenous Daily Mesner, Corene Cornea, MD      . zolpidem (AMBIEN) tablet 5 mg  5 mg Oral QHS PRN Nat Christen, MD   5 mg at 08/26/16 2108   Current Outpatient Prescriptions  Medication Sig Dispense Refill  . gabapentin (NEURONTIN) 100 MG capsule Take 1 capsule (100 mg total) by mouth 3 (three)  times daily. (Patient taking differently: Take 600 mg by mouth at bedtime. ) 60 capsule 0  . insulin glargine (LANTUS) 100 UNIT/ML injection Inject 40 Units into the skin daily. Reported on 07/05/2015    . LORazepam (ATIVAN) 0.5 MG tablet Take 0.5 mg by mouth at bedtime.    . metFORMIN (GLUCOPHAGE) 500 MG tablet Take 500 mg by mouth 2 (two) times daily with a meal.    . HYDROcodone-acetaminophen (NORCO) 5-325 MG tablet Take 2 tablets by mouth every 4 (four) hours as needed. (Patient not taking: Reported on 08/26/2016) 20 tablet 0    Musculoskeletal: UTA via camera  Psychiatric Specialty Exam: Physical Exam  Nursing note and vitals reviewed.   Review of Systems  Psychiatric/Behavioral: Positive for depression and substance  abuse. Negative for hallucinations, memory loss and suicidal ideas. The patient is nervous/anxious. The patient does not have insomnia.   All other systems reviewed and are negative.   Blood pressure 118/61, pulse 73, temperature 98.5 F (36.9 C), temperature source Oral, resp. rate 17, SpO2 98 %.There is no height or weight on file to calculate BMI.  General Appearance: on hospital scrub  Eye Contact:  Good  Speech:  Clear and Coherent and Normal Rate  Volume:  Normal  Mood:  Euthymic  Affect:  Congruent  Thought Process:  Coherent and Goal Directed  Orientation:  Full (Time, Place, and Person)  Thought Content:  WDL and Logical  Suicidal Thoughts:  No  Homicidal Thoughts:  No  Memory:  Immediate;   Good Recent;   Good Remote;   Fair  Judgement:  Intact  Insight:  Good and Present  Psychomotor Activity:  Normal  Concentration:  Concentration: Good and Attention Span: Good  Recall:  Good  Fund of Knowledge:  Good  Language:  Good  Akathisia:  Negative  Handed:  Right  AIMS (if indicated):     Assets:  Communication Skills Desire for Improvement Financial Resources/Insurance Housing Intimacy Leisure Time Physical Health Resilience Social Support   ADL's:  Intact  Cognition:  WNL  Sleep:        Treatment Plan Summary: Plan to discharge with OP resources for SA treatment facilities as well OP provider for depression  Patient will continue to follow up with [facility] services.  Disposition: No evidence of imminent risk to self or others at present.   Patient does not meet criteria for psychiatric inpatient admission. Supportive therapy provided about ongoing stressors. Refer to IOP. Discussed crisis plan, support from social network, calling 911, coming to the Emergency Department, and calling Suicide Hotline.  Vicenta Aly, NP 08/27/2016 11:08 AM

## 2016-08-27 NOTE — Discharge Instructions (Signed)
[facility] [place] to arrange for help with your drug problem and to get psychiatric counseling. If you have any thoughts of harming yourself, call 911 immediately

## 2016-08-27 NOTE — ED Notes (Signed)
Pt on phone at nurses' desk. 

## 2016-08-27 NOTE — ED Notes (Signed)
[facility]

## 2016-08-27 NOTE — Progress Notes (Signed)
Per Ferne ReusJustina Okonkwo, NP, patient does not meet criteria for inpatient treatment. Patient is recommended for discharge and to [facility].  Patient states that [facility] [phone].   CSW attempted to schedule a hospital follow up appointment, however voicemail states that the facility is closed at this time.   Jerrol Bananaebecca Berman, RN notified.   Baldo DaubJolan Tyquan Carmickle MSW, LCSWA CSW Disposition [phone]

## 2017-06-02 ENCOUNTER — Emergency Department (HOSPITAL_COMMUNITY)
Admission: EM | Admit: 2017-06-02 | Discharge: 2017-06-02 | Discharge status: Against Medical Advice | Payer: Non-veteran care

## 2017-06-02 NOTE — ED Notes (Signed)
Pt called for triage multiple times, no response 

## 2017-07-19 ENCOUNTER — Emergency Department (HOSPITAL_COMMUNITY)
Admission: EM | Admit: 2017-07-19 | Discharge: 2017-07-19 | Disposition: A | Discharge status: Home / Self Care | Payer: Non-veteran care | Attending: Emergency Medicine | Admitting: Emergency Medicine

## 2017-07-19 ENCOUNTER — Encounter (HOSPITAL_COMMUNITY): Payer: Self-pay | Admitting: Emergency Medicine

## 2017-07-19 ENCOUNTER — Emergency Department (HOSPITAL_COMMUNITY): Payer: Non-veteran care

## 2017-07-19 DIAGNOSIS — F17210 Nicotine dependence, cigarettes, uncomplicated: Secondary | ICD-10-CM | POA: Diagnosis not present

## 2017-07-19 DIAGNOSIS — F1490 Cocaine use, unspecified, uncomplicated: Secondary | ICD-10-CM

## 2017-07-19 DIAGNOSIS — F1410 Cocaine abuse, uncomplicated: Secondary | ICD-10-CM | POA: Diagnosis not present

## 2017-07-19 DIAGNOSIS — R072 Precordial pain: Secondary | ICD-10-CM | POA: Diagnosis not present

## 2017-07-19 DIAGNOSIS — E1140 Type 2 diabetes mellitus with diabetic neuropathy, unspecified: Secondary | ICD-10-CM | POA: Insufficient documentation

## 2017-07-19 DIAGNOSIS — Z794 Long term (current) use of insulin: Secondary | ICD-10-CM | POA: Insufficient documentation

## 2017-07-19 DIAGNOSIS — Z79899 Other long term (current) drug therapy: Secondary | ICD-10-CM | POA: Diagnosis not present

## 2017-07-19 DIAGNOSIS — R079 Chest pain, unspecified: Secondary | ICD-10-CM | POA: Diagnosis present

## 2017-07-19 LAB — COMPREHENSIVE METABOLIC PANEL
ALK PHOS: 41 U/L (ref 38–126)
ALT: 11 U/L (ref 0–44)
ANION GAP: 11 (ref 5–15)
AST: 19 U/L (ref 15–41)
Albumin: 3 g/dL — ABNORMAL LOW (ref 3.5–5.0)
BUN: 17 mg/dL (ref 8–23)
CALCIUM: 7.9 mg/dL — AB (ref 8.9–10.3)
CO2: 22 mmol/L (ref 22–32)
CREATININE: 1.06 mg/dL (ref 0.61–1.24)
Chloride: 106 mmol/L (ref 98–111)
Glucose, Bld: 247 mg/dL — ABNORMAL HIGH (ref 70–99)
Potassium: 3.4 mmol/L — ABNORMAL LOW (ref 3.5–5.1)
SODIUM: 139 mmol/L (ref 135–145)
TOTAL PROTEIN: 5.7 g/dL — AB (ref 6.5–8.1)
Total Bilirubin: 0.5 mg/dL (ref 0.3–1.2)

## 2017-07-19 LAB — URINALYSIS, ROUTINE W REFLEX MICROSCOPIC
BACTERIA UA: NONE SEEN
BILIRUBIN URINE: NEGATIVE
HGB URINE DIPSTICK: NEGATIVE
Ketones, ur: 5 mg/dL — AB
LEUKOCYTES UA: NEGATIVE
NITRITE: NEGATIVE
PROTEIN: 30 mg/dL — AB
Specific Gravity, Urine: 1.024 (ref 1.005–1.030)
pH: 5 (ref 5.0–8.0)

## 2017-07-19 LAB — CBC WITH DIFFERENTIAL/PLATELET
BASOS PCT: 0 %
Basophils Absolute: 0 10*3/uL (ref 0.0–0.1)
EOS ABS: 0.2 10*3/uL (ref 0.0–0.7)
EOS PCT: 2 %
HCT: 34 % — ABNORMAL LOW (ref 39.0–52.0)
HEMOGLOBIN: 10.6 g/dL — AB (ref 13.0–17.0)
LYMPHS PCT: 10 %
Lymphs Abs: 0.9 10*3/uL (ref 0.7–4.0)
MCH: 27.8 pg (ref 26.0–34.0)
MCHC: 31.2 g/dL (ref 30.0–36.0)
MCV: 89.2 fL (ref 78.0–100.0)
MONO ABS: 0.4 10*3/uL (ref 0.1–1.0)
Monocytes Relative: 5 %
NEUTROS PCT: 83 %
Neutro Abs: 7.2 10*3/uL (ref 1.7–7.7)
Platelets: 234 10*3/uL (ref 150–400)
RBC: 3.81 MIL/uL — ABNORMAL LOW (ref 4.22–5.81)
RDW: 13.4 % (ref 11.5–15.5)
WBC: 8.7 10*3/uL (ref 4.0–10.5)

## 2017-07-19 LAB — I-STAT TROPONIN, ED
TROPONIN I, POC: 0 ng/mL (ref 0.00–0.08)
TROPONIN I, POC: 0 ng/mL (ref 0.00–0.08)

## 2017-07-19 LAB — RAPID URINE DRUG SCREEN, HOSP PERFORMED
Amphetamines: NOT DETECTED
Benzodiazepines: NOT DETECTED
Cocaine: POSITIVE — AB
OPIATES: NOT DETECTED
Tetrahydrocannabinol: NOT DETECTED

## 2017-07-19 MED ORDER — LORAZEPAM 2 MG/ML IJ SOLN
1.0000 mg | Freq: Once | INTRAMUSCULAR | Status: AC
  Administered 2017-07-19: 1 mg via INTRAVENOUS
  Filled 2017-07-19: qty 1

## 2017-07-19 MED ORDER — SODIUM CHLORIDE 0.9 % IV BOLUS
1000.0000 mL | Freq: Once | INTRAVENOUS | Status: AC
  Administered 2017-07-19: 1000 mL via INTRAVENOUS

## 2017-07-19 NOTE — Discharge Instructions (Signed)
Your work-up today was overall reassuring.  Your troponins were negative and your chest x-ray is reassuring.  We suspect your chest pain is due to the crack cocaine use overnight into today.  After your period of observation, your symptoms resolved and we feel you are safe for discharge home.  Please follow-up with your PCP for further management.  If any symptoms change or worsen, please return to the nearest emergency department.

## 2017-07-19 NOTE — ED Provider Notes (Signed)
[facility] EMERGENCY DEPARTMENT Provider Note   CSN: [id number] Arrival date & time: 07/19/17  1504     History   Chief Complaint Chief Complaint  Patient presents with  . Chest Pain    HPI William Acevedo is a 62 y.o. male.  The history is provided by the patient William medical records. No language interpreter was used.  Chest Pain   This is a new problem. The current episode started less than 1 hour ago. The problem occurs constantly. The problem has been rapidly improving. Associated with: crack cocaine use. The pain is present in the substernal region. The pain is at a severity of 4/10. The pain is moderate. The quality of the pain is described as sharp. The pain does not radiate. Duration of episode(s) is 1 hour. Associated symptoms include nausea, palpitations William shortness of breath. Pertinent negatives include no abdominal pain, no back pain, no cough, no diaphoresis, no exertional chest pressure, no fever, no headaches, no irregular heartbeat, no lower extremity edema, no malaise/fatigue, no near-syncope, no numbness, no sputum production, no syncope, no vomiting William no weakness. He has tried rest for the symptoms. The treatment provided moderate relief.  His past medical history is significant for diabetes.    Past Medical History:  Diagnosis Date  . Carpal tunnel syndrome   . Depression   . Diabetes mellitus without complication (HCC)   . Neuropathy     Patient Active Problem List   Diagnosis Date Noted  . Overdose 07/06/2015  . AKI (acute kidney injury) (HCC) 07/06/2015  . Polysubstance abuse (HCC) 07/06/2015  . DM2 (diabetes mellitus, type 2) (HCC) 07/06/2015  . Dehydration   . MDD (major depressive disorder), recurrent episode, mild (HCC)   . Uncomplicated alcohol dependence (HCC)     Past Surgical History:  Procedure Laterality Date  . TESTICLE TORSION REDUCTION          Home Medications    Prior to Admission medications     Medication Sig Start Date End Date Taking? Authorizing Provider  gabapentin (NEURONTIN) 100 MG capsule Take 1 capsule (100 mg total) by mouth 3 (three) times daily. Patient taking differently: Take 600 mg by mouth at bedtime.  12/02/13   Muthersbaugh, Dahlia Client, PA-C  HYDROcodone-acetaminophen (NORCO) 5-325 MG tablet Take 2 tablets by mouth every 4 (four) hours as needed. Patient not taking: Reported on 08/26/2016 10/22/15   Mancel Bale, MD  insulin glargine (LANTUS) 100 UNIT/ML injection Inject 40 Units into the skin daily. Reported on 07/05/2015    [provider]  LORazepam (ATIVAN) 0.5 MG tablet Take 0.5 mg by mouth at bedtime.    [provider]  metFORMIN (GLUCOPHAGE) 500 MG tablet Take 500 mg by mouth 2 (two) times daily with a meal.    [provider]    William History History reviewed. No pertinent William history.  Social History Social History   Tobacco Use  . Smoking status: Current Every Day Smoker    Packs/day: 0.50    Types: Cigarettes  Substance Use Topics  . Alcohol use: Yes  . Drug use: Yes    Types: Cocaine    Comment: 18  months clean     Allergies   Patient has no known allergies.   Review of Systems Review of Systems  Constitutional: Negative for appetite change, diaphoresis, fatigue, fever William malaise/fatigue.  HENT: Negative for congestion.   Eyes: Negative for visual disturbance.  Respiratory: Positive for shortness of breath. Negative for cough,  sputum production, chest tightness, wheezing William stridor.   Cardiovascular: Positive for chest pain William palpitations. Negative for syncope William near-syncope.  Gastrointestinal: Positive for nausea. Negative for abdominal pain, constipation, diarrhea William vomiting.  Genitourinary: Negative for dysuria William frequency.  Musculoskeletal: Negative for back pain, neck pain William neck stiffness.  Skin: Negative for rash William wound.  Neurological: Negative for weakness, light-headedness, numbness  William headaches.  Psychiatric/Behavioral: Negative for agitation.  All other systems reviewed William are negative.    Physical Exam Updated Vital Signs BP 127/70 (BP Location: Right Arm)   Pulse 99   Temp 99.2 F (37.3 C) (Oral)   Resp 13   SpO2 96%   Physical Exam  Constitutional: He is oriented to person, place, William time. He appears well-developed William well-nourished.  Non-toxic appearance. He does not appear ill. No distress.  HENT:  Head: Normocephalic William atraumatic.  Nose: Nose normal.  Mouth/Throat: Oropharynx is clear William moist. No oropharyngeal exudate.  Eyes: Pupils are equal, round, William reactive to light. Conjunctivae William EOM are normal.  Neck: Normal range of motion. Neck supple.  Cardiovascular: Normal rate, regular rhythm William intact distal pulses.  Murmur heard. Pulmonary/Chest: Effort normal William breath sounds normal. No respiratory distress. He has no wheezes. He has no rales. He exhibits no tenderness.  Abdominal: Soft. There is no tenderness. There is no guarding.  Musculoskeletal: He exhibits no edema William tenderness.  Neurological: He is alert William oriented to person, place, William time. No sensory deficit. He exhibits normal muscle tone.  Skin: Skin is warm William dry. Capillary refill takes less than 2 seconds. He is not diaphoretic. No erythema. No pallor.  Psychiatric: He has a normal mood William affect.  Nursing note William vitals reviewed.    ED Treatments / Results  Labs (all labs ordered are listed, but only abnormal results are displayed) Labs Reviewed  CBC WITH DIFFERENTIAL/PLATELET - Abnormal; Notable for the following components:      Result Value   RBC 3.81 (*)    Hemoglobin 10.6 (*)    HCT 34.0 (*)    All other components within normal limits  COMPREHENSIVE METABOLIC PANEL - Abnormal; Notable for the following components:   Potassium 3.4 (*)    Glucose, Bld 247 (*)    Calcium 7.9 (*)    Total Protein 5.7 (*)    Albumin 3.0 (*)    All other components  within normal limits  RAPID URINE DRUG SCREEN, HOSP PERFORMED - Abnormal; Notable for the following components:   Cocaine POSITIVE (*)    Barbiturates   (*)    Value: Result not available. Reagent lot number recalled by manufacturer.   All other components within normal limits  URINALYSIS, ROUTINE W REFLEX MICROSCOPIC - Abnormal; Notable for the following components:   Glucose, UA >=500 (*)    Ketones, ur 5 (*)    Protein, ur 30 (*)    All other components within normal limits  I-STAT TROPONIN, ED  I-STAT TROPONIN, ED    EKG EKG Interpretation  Date/Time:  Wednesday July 19 2017 15:11:02 EDT Ventricular Rate:  100 PR Interval:    QRS Duration: 81 QT Interval:  335 QTC Calculation: 432 R Axis:   52 Text Interpretation:  Sinus tachycardia Right atrial enlargement Minimal ST elevation, anterior leads When compared to prior, no significant changes seen.  no STEMI Confirmed by Theda Belfastegeler, Chris ([id number]) on 07/19/2017 3:18:13 PM   Radiology Dg Chest 2 View  Result Date: 07/19/2017 CLINICAL DATA:  Onset of chest pain 20 minutes prior to presentation. Patient had been using crack cocaine since yesterday evening. Current tobacco user. History of diabetes. EXAM: CHEST - 2 VIEW COMPARISON:  None in PACs FINDINGS: The lungs are adequately inflated. There is no focal infiltrate. There is no pleural effusion. There is no pneumothorax William pneumomediastinum. The heart William pulmonary vascularity are normal. The mediastinum is normal in width. The bony thorax is unremarkable. IMPRESSION: There is no acute cardiopulmonary abnormality. Electronically Signed   By: David  [place] M.D.   On: 07/19/2017 16:09    Procedures Procedures (including critical care time)  Medications Ordered in ED Medications  LORazepam (ATIVAN) injection 1 mg (1 mg Intravenous Given 07/19/17 1538)  sodium chloride 0.9 % bolus 1,000 mL (0 mLs Intravenous Stopped 07/19/17 1639)     Initial Impression / Assessment William Plan / ED Course    I have reviewed the triage vital signs William the nursing notes.  Pertinent labs & imaging results that were available during my care of the patient were reviewed by me William considered in my medical decision making (see chart for details).     William Acevedo is a 62 y.o. male with a past medical history significant for polysubstance abuse, diabetes, William neuropathies who presents with chest pain.  Patient reports that he uses crack cocaine every day.  He reports that he has not slept in 2 days William he used crack cocaine all night until approximately 1 hour prior to arrival.  He says that since he took his "last puff" he started having chest pain.  He describes the pain as a sharp pain in his central chest.  It does not radiate.  It is associated with shortness of breath, nausea, William palpitations.  He denied syncope William lightheadedness.  He reports the pain is now a 4 out of 10 down from a 10 out of 10 at onset.  He took aspirin with EMS.  He does report that he drank alcohol last night.  He denied other symptoms including no trauma.  No cuts patient, diarrhea, William urinary symptoms.  He denies any rashes.  He denies any other complaints.  EMS documentation shows that patient was found to be slightly hypotensive with a blood pressure of 87/53 on their arrival which improved to 120/60 1:05 100 mL of fluid.  Patient also received Zofran with EMS which improved his nausea.  Patient says that he was not eating William drinking overnight aside from the cocaine William alcohol.  He says that he was locked out of his house by William member for "bad choices".  Patient is feeling much better while resting since he has not slept.  Patient denies exertional William pleuritic component of the pain.  On exam, chest is nontender.  Lungs are clear.  Patient had a systolic murmur.  Abdomen is nontender.  Patient has symmetric pulses in upper William lower extremities.  No significant lower extremity edema seen.  No evidence of trauma was  seen on his chest William head.  Patient is alert William oriented.  EKG showed no significant change from prior no STEMI was seen.    Patient will have screening laboratory testing William chest x-ray to look for etiology of symptoms.  Suspect cocaine related chest pain.  Patient will be observed for period of time William have his laboratory values reassessed.    Anticipate discharge if chest pain is improved William reassuring work-up.   11:01 PM Patient's work-up was overall reassuring.  Troponin  negative x2.  Cocaine was found in his urine which patient admitted to.  Patient did have evidence of anemia with hemoglobin 10.6 however he denies any hematemesis William rectal bleeding.  Patient also found to have mild hypokalemia William hypocalcemia.  Chest x-ray showed no acute cardia pulmonary normality.    Patient was given some Ativan for the likely cocaine related chest pain.    Patient rested for a period of time William now has no chest pain William feels "great".  Given patient's reassuring work-up William no other symptoms he was felt stable for discharge home.  Patient will follow-up with PCP William understood return precautions.  Patient discharged in good condition.    Final Clinical Impressions(s) / ED Diagnoses   Final diagnoses:  Precordial pain  Cocaine use    ED Discharge Orders    None      Clinical Impression: 1. Precordial pain   2. Cocaine use     Disposition: Discharge  Condition: Good  I have discussed the results, Dx William Tx plan with the pt(& William if present). He/she/they expressed understanding William agree(s) with the plan. Discharge instructions discussed at great length. Strict return precautions discussed William pt &/William William have verbalized understanding of the instructions. No further questions at time of discharge.    New Prescriptions   No medications on file    Follow Up: Priscille Kluver, MD [address] [place] [address] [phone]        Tegeler,  Canary Brim, MD 07/19/17 [phone]

## 2017-07-19 NOTE — ED Triage Notes (Signed)
Pt arrives via EMS complaining of CP that started 20 min ago. Pt has been using crack cocaine since last evening. Pt feeling nauseous and having abd pain as well. Initial BP for EMS 87/53, no nitro given. BP improved to 120/65 with 400 mL bolus NS from EMS. Pt also received 324mg  ASA and 4mg  zofran from EMS.

## 2017-08-10 ENCOUNTER — Ambulatory Visit (INDEPENDENT_AMBULATORY_CARE_PROVIDER_SITE_OTHER): Payer: No Typology Code available for payment source | Admitting: Podiatry

## 2017-08-10 DIAGNOSIS — M779 Enthesopathy, unspecified: Secondary | ICD-10-CM | POA: Diagnosis not present

## 2017-08-10 DIAGNOSIS — B353 Tinea pedis: Secondary | ICD-10-CM | POA: Diagnosis not present

## 2017-08-10 DIAGNOSIS — Q828 Other specified congenital malformations of skin: Secondary | ICD-10-CM

## 2017-08-10 DIAGNOSIS — E119 Type 2 diabetes mellitus without complications: Secondary | ICD-10-CM

## 2017-08-10 MED ORDER — KETOCONAZOLE 2 % EX CREA: TOPICAL_CREAM | CUTANEOUS | 0 refills | Status: DC

## 2017-08-10 NOTE — Progress Notes (Signed)
  Subjective:  Patient ID: William Acevedo, male    DOB: 30-Jun-1955,  MRN: [id number]  Chief Complaint  Patient presents with  . Diabetes    diabetic foot exam  . Callouses    bilateral 5th metatarsal painful callouses   62 y.o. male presents with the above complaint.  Reports painful calluses to the outside of both feet.  States that he would like to discuss removing the nerve that is causing the pain.  Diabetic with last sugar in the 100s  Past Medical History:  Diagnosis Date  . Carpal tunnel syndrome   . Depression   . Diabetes mellitus without complication (HCC)   . Neuropathy    Past Surgical History:  Procedure Laterality Date  . TESTICLE TORSION REDUCTION      Current Outpatient Medications:  .  gabapentin (NEURONTIN) 100 MG capsule, Take 1 capsule (100 mg total) by mouth 3 (three) times daily. (Patient taking differently: Take 600 mg by mouth at bedtime. ), Disp: 60 capsule, Rfl: 0 .  HYDROcodone-acetaminophen (NORCO) 5-325 MG tablet, Take 2 tablets by mouth every 4 (four) hours as needed. (Patient not taking: Reported on 08/26/2016), Disp: 20 tablet, Rfl: 0 .  insulin glargine (LANTUS) 100 UNIT/ML injection, Inject 40 Units into the skin daily. Reported on 07/05/2015, Disp: , Rfl:  .  ketoconazole (NIZORAL) 2 % cream, Apply 1 fingertip amount to each foot daily., Disp: 30 g, Rfl: 0 .  LORazepam (ATIVAN) 0.5 MG tablet, Take 0.5 mg by mouth at bedtime., Disp: , Rfl:  .  metFORMIN (GLUCOPHAGE) 500 MG tablet, Take 500 mg by mouth 2 (two) times daily with a meal., Disp: , Rfl:   No Known Allergies Review of Systems: Negative except as noted in the HPI. Denies N/V/F/Ch. Objective:  There were no vitals filed for this visit. General AA&O x3. Normal mood and affect.  Vascular Dorsalis pedis and posterior tibial pulses  present 2+ bilaterally  Capillary refill normal to all digits. Pedal hair growth normal.  Neurologic Epicritic sensation grossly present.  Dermatologic No  open lesions. Interspaces clear of maceration. Nails well groomed and normal in appearance.  Hyperkeratosis 5th MPJ bilat Xerosis with scaling plantar foot bilateral  Orthopedic: MMT 5/5 in dorsiflexion, plantarflexion, inversion, and eversion. Pain palpation about the fifth MPJ bilaterally Tailors bunion bilateral   Assessment & Plan:  Patient was evaluated and treated and all questions answered.  DM without Complication -Diabetic risk level 0  Capsulitis fifth MPJ bilateral with hyperkeratosis -Injection delivered as below -Needs XR at next visit -Hyperkeratosis x2 debrided  Procedure: Joint Injection Location: Bilateral 5th MPJ joint Skin Prep: Alcohol. Injectate: 0.5 cc 1% lidocaine plain, 0.5 cc dexamethasone phosphate. Disposition: Patient tolerated procedure well. Injection site dressed with a band-aid.  Procedure: Paring of Lesion Rationale: painful hyperkeratotic lesion Type of Debridement: manual, sharp debridement. Instrumentation: 312 blade Number of Lesions: 2  Return in about 6 weeks (around 09/21/2017) for Capsulitis.

## 2017-09-21 ENCOUNTER — Ambulatory Visit: Payer: Non-veteran care | Admitting: Podiatry

## 2017-12-07 ENCOUNTER — Telehealth: Payer: Self-pay | Admitting: Podiatry

## 2017-12-07 NOTE — Telephone Encounter (Signed)
[facility] to  [phone] [facility]

## 2018-09-04 ENCOUNTER — Other Ambulatory Visit: Payer: Self-pay

## 2018-09-04 ENCOUNTER — Emergency Department (HOSPITAL_COMMUNITY)
Admission: EM | Admit: 2018-09-04 | Discharge: 2018-09-04 | Discharge status: Against Medical Advice | Payer: No Typology Code available for payment source | Attending: Emergency Medicine | Admitting: Emergency Medicine

## 2018-09-04 ENCOUNTER — Encounter (HOSPITAL_COMMUNITY): Payer: Self-pay | Admitting: Emergency Medicine

## 2018-09-04 DIAGNOSIS — R55 Syncope and collapse: Secondary | ICD-10-CM | POA: Diagnosis present

## 2018-09-04 DIAGNOSIS — Z5321 Procedure and treatment not carried out due to patient leaving prior to being seen by health care provider: Secondary | ICD-10-CM | POA: Insufficient documentation

## 2018-09-04 LAB — BASIC METABOLIC PANEL
Anion gap: 10 (ref 5–15)
BUN: 13 mg/dL (ref 8–23)
CO2: 26 mmol/L (ref 22–32)
Calcium: 9.4 mg/dL (ref 8.9–10.3)
Chloride: 102 mmol/L (ref 98–111)
Creatinine, Ser: 0.91 mg/dL (ref 0.61–1.24)
GFR calc Af Amer: >60 mL/min (ref >60)
GFR calc non Af Amer: >60 mL/min (ref >60)
Glucose, Bld: 204 mg/dL — ABNORMAL HIGH (ref 70–99)
Potassium: 4 mmol/L (ref 3.5–5.1)
Sodium: 138 mmol/L (ref 135–145)

## 2018-09-04 LAB — CBC
HCT: 37 % — ABNORMAL LOW (ref 39.0–52.0)
Hemoglobin: 12.2 g/dL — ABNORMAL LOW (ref 13.0–17.0)
MCH: 29.5 pg (ref 26.0–34.0)
MCHC: 33 g/dL (ref 30.0–36.0)
MCV: 89.6 fL (ref 80.0–100.0)
Platelets: 246 10*3/uL (ref 150–400)
RBC: 4.13 MIL/uL — ABNORMAL LOW (ref 4.22–5.81)
RDW: 11.9 % (ref 11.5–15.5)
WBC: 6.6 10*3/uL (ref 4.0–10.5)
nRBC: 0 % (ref 0.0–0.2)

## 2018-09-04 MED ORDER — SODIUM CHLORIDE 0.9% FLUSH: 3.0000 mL | Freq: Once | INTRAVENOUS | Status: DC

## 2018-09-04 NOTE — ED Notes (Signed)
Called pt 3x to go back to exam room. No response.  

## 2018-09-04 NOTE — ED Notes (Signed)
Pt eating snack and soda from vending machine

## 2018-09-04 NOTE — ED Notes (Addendum)
Second attempt, called pt 3x to go back to exam room. No response.

## 2018-09-04 NOTE — ED Triage Notes (Signed)
Patient reports he blacked out while outside yesterday and hit the left side of his face on a tree root. Hematoma noted under L eye and minor swelling to side of face. He states he is diabetic and passed out. Doesn't check blood sugar, but takes medicaitons as prescribed nightly. Ambulatory with steady gait. Denies dizziness, N/V, vision changes.

## 2018-09-12 ENCOUNTER — Emergency Department (HOSPITAL_COMMUNITY)
Admission: EM | Admit: 2018-09-12 | Discharge: 2018-09-12 | Disposition: A | Discharge status: Home / Self Care | Payer: No Typology Code available for payment source | Attending: Emergency Medicine | Admitting: Emergency Medicine

## 2018-09-12 ENCOUNTER — Other Ambulatory Visit: Payer: Self-pay

## 2018-09-12 ENCOUNTER — Encounter (HOSPITAL_COMMUNITY): Payer: Self-pay

## 2018-09-12 ENCOUNTER — Emergency Department (HOSPITAL_COMMUNITY): Payer: No Typology Code available for payment source

## 2018-09-12 DIAGNOSIS — E119 Type 2 diabetes mellitus without complications: Secondary | ICD-10-CM | POA: Insufficient documentation

## 2018-09-12 DIAGNOSIS — Z79899 Other long term (current) drug therapy: Secondary | ICD-10-CM | POA: Insufficient documentation

## 2018-09-12 DIAGNOSIS — F17210 Nicotine dependence, cigarettes, uncomplicated: Secondary | ICD-10-CM | POA: Diagnosis not present

## 2018-09-12 DIAGNOSIS — F1910 Other psychoactive substance abuse, uncomplicated: Secondary | ICD-10-CM | POA: Insufficient documentation

## 2018-09-12 DIAGNOSIS — Z794 Long term (current) use of insulin: Secondary | ICD-10-CM | POA: Diagnosis not present

## 2018-09-12 DIAGNOSIS — R4182 Altered mental status, unspecified: Secondary | ICD-10-CM | POA: Diagnosis present

## 2018-09-12 LAB — RAPID URINE DRUG SCREEN, HOSP PERFORMED
Amphetamines: NOT DETECTED
Barbiturates: NOT DETECTED
Benzodiazepines: POSITIVE — AB
Cocaine: POSITIVE — AB
Opiates: NOT DETECTED
Tetrahydrocannabinol: NOT DETECTED

## 2018-09-12 LAB — ETHANOL: Alcohol, Ethyl (B): <10 mg/dL (ref <10)

## 2018-09-12 LAB — CBG MONITORING, ED: Glucose-Capillary: 144 mg/dL — ABNORMAL HIGH (ref 70–99)

## 2018-09-12 MED ORDER — AMMONIA AROMATIC IN INHA
0.3000 mL | Freq: Once | RESPIRATORY_TRACT | Status: AC
  Administered 2018-09-12: 0.3 mL via RESPIRATORY_TRACT

## 2018-09-12 NOTE — ED Provider Notes (Signed)
Patient care assumed at 0700. Patient here for altered mental status after taking Xanax and cocaine. On initial assessment patient with  somnolence, difficult to arouse. On repeat assessment in the emergency department he is awake and alert, following commands. He denies any SI, HI or acute complaints. Plan to discharge home with outpatient follow-up and return precautions.   Quintella Reichert, MD 09/12/18 1400

## 2018-09-12 NOTE — ED Triage Notes (Signed)
Pt arrived via [facility] after a gas station attendant called saying he seemed "sleepy" per EMS patient told attendant he did a bar of Xanax and drank an unknown amount of beer. Pt sleeping and maintaining his airway, will not answer any questions at this time.

## 2018-09-12 NOTE — ED Provider Notes (Signed)
[facility]-EMERGENCY DEPT Provider Note   CSN: [id number] Arrival date & time: 09/12/18  0128     History   Chief Complaint Chief Complaint  Patient presents with  . Alcohol Intoxication  . Drug Overdose    HPI William BouillonCharlie C Acevedo is a 63 y.o. male.     HPI  This is a 63 year old male with a history of diabetes, polysubstance abuse who presents by EMS after he was noted to be "sleepy" by the attendant.  He admitted to EMS reportedly to taking a bar of Xanax and drinking beer.  He is unable to provide any history on my evaluation.  He is somnolent and minimally arousable.  Level 5 caveat for mental status change and intoxication.  Past Medical History:  Diagnosis Date  . Carpal tunnel syndrome   . Depression   . Diabetes mellitus without complication (HCC)   . Neuropathy     Patient Active Problem List   Diagnosis Date Noted  . Overdose 07/06/2015  . AKI (acute kidney injury) (HCC) 07/06/2015  . Polysubstance abuse (HCC) 07/06/2015  . DM2 (diabetes mellitus, type 2) (HCC) 07/06/2015  . Dehydration   . MDD (major depressive disorder), recurrent episode, mild (HCC)   . Uncomplicated alcohol dependence (HCC)     Past Surgical History:  Procedure Laterality Date  . TESTICLE TORSION REDUCTION          Home Medications    Prior to Admission medications   Medication Sig Start Date End Date Taking? Authorizing Provider  gabapentin (NEURONTIN) 100 MG capsule Take 1 capsule (100 mg total) by mouth 3 (three) times daily. Patient taking differently: Take 600 mg by mouth at bedtime.  12/02/13   Muthersbaugh, Dahlia ClientHannah, PA-C  HYDROcodone-acetaminophen (NORCO) 5-325 MG tablet Take 2 tablets by mouth every 4 (four) hours as needed. Patient not taking: Reported on 08/26/2016 10/22/15   Mancel BaleWentz, Elliott, MD  insulin glargine (LANTUS) 100 UNIT/ML injection Inject 40 Units into the skin daily. Reported on 07/05/2015    [provider]  ketoconazole  (NIZORAL) 2 % cream Apply 1 fingertip amount to each foot daily. 08/10/17   Park LiterPrice, Michael J, DPM  LORazepam (ATIVAN) 0.5 MG tablet Take 0.5 mg by mouth at bedtime.    [provider]  metFORMIN (GLUCOPHAGE) 500 MG tablet Take 500 mg by mouth 2 (two) times daily with a meal.    [provider]    Family History No family history on file.  Social History Social History   Tobacco Use  . Smoking status: Current Every Day Smoker    Packs/day: 0.50    Types: Cigarettes  . Smokeless tobacco: Never Used  Substance Use Topics  . Alcohol use: Yes  . Drug use: Yes    Types: Cocaine     Allergies   Patient has no known allergies.   Review of Systems Review of Systems  Unable to perform ROS: Mental status change     Physical Exam Updated Vital Signs BP 136/74 (BP Location: Right Arm)   Pulse 83   Temp 97.6 F (36.4 C) (Oral)   Resp 19   SpO2 99%   Physical Exam Vitals signs and nursing note reviewed.  Constitutional:      Appearance: He is well-developed.     Comments: Somnolent, minimally arousable, no acute distress, ABCs intact  HENT:     Head: Normocephalic and atraumatic.     Mouth/Throat:     Mouth: Mucous membranes are moist.  Eyes:  Pupils: Pupils are equal, round, and reactive to light.     Comments: Pupils 4 mm reactive bilaterally  Neck:     Musculoskeletal: Neck supple.  Cardiovascular:     Rate and Rhythm: Normal rate and regular rhythm.     Heart sounds: Normal heart sounds. No murmur.  Pulmonary:     Effort: Pulmonary effort is normal. No respiratory distress.     Breath sounds: Normal breath sounds. No wheezing.  Abdominal:     Palpations: Abdomen is soft.     Tenderness: There is no abdominal tenderness.  Lymphadenopathy:     Cervical: No cervical adenopathy.  Skin:    General: Skin is warm and dry.  Neurological:     Comments: Somnolent, minimally arousable, does spontaneously move all 4 extremities  Psychiatric:      Comments: Unable to assess      ED Treatments / Results  Labs (all labs ordered are listed, but only abnormal results are displayed) Labs Reviewed  ETHANOL  RAPID URINE DRUG SCREEN, HOSP PERFORMED    EKG None  Radiology No results found.  Procedures Procedures (including critical care time)  Medications Ordered in ED Medications - No data to display   Initial Impression / Assessment and Plan / ED Course  I have reviewed the triage vital signs and the nursing notes.  Pertinent labs & imaging results that were available during my care of the patient were reviewed by me and considered in my medical decision making (see chart for details).        6:33 AM Attempted to wake patient up.  Remains very somnolent.  He is arousable to painful stimuli and ammonia tablets.  He will localize to pain.  Recheck CBG to ensure no hypoglycemia.  CT scan has been negative.  He was positive for cocaine and benzodiazepines.  EtOH negative.  Will continue to allow him to metabolize.  Final Clinical Impressions(s) / ED Diagnoses   Final diagnoses:  None    ED Discharge Orders    None       Horton, Barbette Hair, MD 09/12/18 [phone]

## 2018-09-12 NOTE — ED Notes (Signed)
Patient is getting dressed. Offered to help him get out of the facility and smarted off with "I know how I got myself in here and I can get myself out".

## 2018-09-21 ENCOUNTER — Emergency Department (HOSPITAL_COMMUNITY): Payer: No Typology Code available for payment source

## 2018-09-21 ENCOUNTER — Emergency Department (HOSPITAL_COMMUNITY)
Admission: EM | Admit: 2018-09-21 | Discharge: 2018-09-21 | Disposition: A | Discharge status: Home / Self Care | Payer: No Typology Code available for payment source | Attending: Emergency Medicine | Admitting: Emergency Medicine

## 2018-09-21 ENCOUNTER — Encounter (HOSPITAL_COMMUNITY): Payer: Self-pay

## 2018-09-21 ENCOUNTER — Other Ambulatory Visit: Payer: Self-pay

## 2018-09-21 DIAGNOSIS — E1140 Type 2 diabetes mellitus with diabetic neuropathy, unspecified: Secondary | ICD-10-CM | POA: Insufficient documentation

## 2018-09-21 DIAGNOSIS — Z79899 Other long term (current) drug therapy: Secondary | ICD-10-CM | POA: Insufficient documentation

## 2018-09-21 DIAGNOSIS — W0110XA Fall on same level from slipping, tripping and stumbling with subsequent striking against unspecified object, initial encounter: Secondary | ICD-10-CM | POA: Diagnosis not present

## 2018-09-21 DIAGNOSIS — S0993XA Unspecified injury of face, initial encounter: Secondary | ICD-10-CM | POA: Diagnosis present

## 2018-09-21 DIAGNOSIS — Y929 Unspecified place or not applicable: Secondary | ICD-10-CM | POA: Diagnosis not present

## 2018-09-21 DIAGNOSIS — Z794 Long term (current) use of insulin: Secondary | ICD-10-CM | POA: Diagnosis not present

## 2018-09-21 DIAGNOSIS — S02632A Fracture of coronoid process of left mandible, initial encounter for closed fracture: Secondary | ICD-10-CM | POA: Diagnosis not present

## 2018-09-21 DIAGNOSIS — Y9389 Activity, other specified: Secondary | ICD-10-CM | POA: Diagnosis not present

## 2018-09-21 DIAGNOSIS — Y999 Unspecified external cause status: Secondary | ICD-10-CM | POA: Diagnosis not present

## 2018-09-21 DIAGNOSIS — F17210 Nicotine dependence, cigarettes, uncomplicated: Secondary | ICD-10-CM | POA: Insufficient documentation

## 2018-09-21 MED ORDER — TRAMADOL HCL 50 MG PO TABS: 50.0000 mg | ORAL_TABLET | Freq: Four times a day (QID) | ORAL | 0 refills | Status: DC | PRN

## 2018-09-21 MED ORDER — KETOROLAC TROMETHAMINE 60 MG/2ML IM SOLN
60.0000 mg | Freq: Once | INTRAMUSCULAR | Status: AC
  Administered 2018-09-21: 60 mg via INTRAMUSCULAR
  Filled 2018-09-21: qty 2

## 2018-09-21 MED ORDER — IBUPROFEN 800 MG PO TABS: 800.0000 mg | ORAL_TABLET | Freq: Three times a day (TID) | ORAL | 0 refills | Status: DC | PRN

## 2018-09-21 NOTE — ED Notes (Signed)
Patient transported to CT 

## 2018-09-21 NOTE — Discharge Instructions (Addendum)
Return here as needed.  Follow-up with the doctor provided for recheck.

## 2018-09-21 NOTE — ED Triage Notes (Signed)
Pt presents with c/o left side jaw pain after a fall that occurred 2 weeks ago. Pt reports he was hoping that the pain would get better but that the pain is still present. Pt also c/o dental pain.

## 2018-09-22 NOTE — ED Provider Notes (Signed)
[facility] DEPT Provider Note   CSN: [id number] Arrival date & time: 09/21/18  1624     History   Chief Complaint Chief Complaint  Patient presents with  . Fall  . Jaw Pain    HPI William Acevedo is a 62 y.o. male.     HPI Patient presents to the emergency department with pain to the left side of his jaw occurred after a fall 2 weeks ago.  The patient states that he was mowing grass when he tripped and fell.  Patient states that he has had pain since that time.  Patient denies any other injuries.  Patient denies headache, blurred vision, nausea, vomiting, weakness, dizziness, neck pain or syncope. Past Medical History:  Diagnosis Date  . Carpal tunnel syndrome   . Depression   . Diabetes mellitus without complication ([place])   . Neuropathy     Patient Active Problem List   Diagnosis Date Noted  . Overdose 07/06/2015  . AKI (acute kidney injury) ([place]) 07/06/2015  . Polysubstance abuse ([place]) 07/06/2015  . DM2 (diabetes mellitus, type 2) ([place]) 07/06/2015  . Dehydration   . MDD (major depressive disorder), recurrent episode, mild ([place])   . Uncomplicated alcohol dependence ([place])     Past Surgical History:  Procedure Laterality Date  . TESTICLE TORSION REDUCTION          Home Medications    Prior to Admission medications   Medication Sig Start Date End Date Taking? Authorizing Provider  gabapentin (NEURONTIN) 100 MG capsule Take 1 capsule (100 mg total) by mouth 3 (three) times daily. Patient taking differently: Take 600 mg by mouth at bedtime.  12/02/13  Yes Muthersbaugh, [name], PA-C  insulin glargine (LANTUS) 100 UNIT/ML injection Inject 40 Units into the skin daily. Reported on 07/05/2015   Yes [provider]  metFORMIN (GLUCOPHAGE) 500 MG tablet Take 500 mg by mouth 2 (two) times daily with a meal.   Yes [provider]  HYDROcodone-acetaminophen (NORCO) 5-325 MG tablet Take 2 tablets by mouth every 4 (four)  hours as needed. Patient not taking: Reported on 08/26/2016 10/22/15   Daleen Bo, MD  ibuprofen (ADVIL) 800 MG tablet Take 1 tablet (800 mg total) by mouth every 8 (eight) hours as needed. 09/21/18   Tearia Gibbs, [name], PA-C  ketoconazole (NIZORAL) 2 % cream Apply 1 fingertip amount to each foot daily. Patient not taking: Reported on 09/21/2018 08/10/17   [name], DPM  traMADol (ULTRAM) 50 MG tablet Take 1 tablet (50 mg total) by mouth every 6 (six) hours as needed for severe pain. 09/21/18   [name], PA-C    Family History History reviewed. No pertinent family history.  Social History Social History   Tobacco Use  . Smoking status: Current Every Day Smoker    Packs/day: 0.50    Types: Cigarettes  . Smokeless tobacco: Never Used  Substance Use Topics  . Alcohol use: Yes  . Drug use: Yes    Types: Cocaine     Allergies   Patient has no known allergies.   Review of Systems Review of Systems All other systems negative except as documented in the HPI. All pertinent positives and negatives as reviewed in the HPI.  Physical Exam Updated Vital Signs BP (!) 186/90 Comment: PA made aware of d/c BP. Patient denies taking BP meds today  Pulse 75   Temp 98.7 F (37.1 C) (Oral)   Resp 18   Ht 5\' 6"  (1.676 m)  Wt 59 kg   SpO2 100%   BMI 20.98 kg/m   Physical Exam Vitals signs and nursing note reviewed.  Constitutional:      General: He is not in acute distress.    Appearance: He is well-developed.  HENT:     Head: Normocephalic and atraumatic.   Eyes:     Pupils: Pupils are equal, round, and reactive to light.  Pulmonary:     Effort: Pulmonary effort is normal.  Skin:    General: Skin is warm and dry.  Neurological:     Mental Status: He is alert and oriented to person, place, and time.      ED Treatments / Results  Labs (all labs ordered are listed, but only abnormal results are displayed) Labs Reviewed - No data to display  EKG None   Radiology Ct Maxillofacial Wo Contrast  Result Date: 09/21/2018 CLINICAL DATA:  Left cheek pain and numbness for the past 4 days. Previous fall. EXAM: CT MAXILLOFACIAL WITHOUT CONTRAST TECHNIQUE: Multidetector CT imaging of the maxillofacial structures was performed. Multiplanar CT image reconstructions were also generated. COMPARISON:  Head CT dated 09/12/2018. FINDINGS: Osseous: Previously noted chronic left tripod fractures, including a comminuted, displaced and depressed fracture of the left leg a manic arch, lateral wall of the left maxillary sinus. Anterior wall of the left maxillary sinus and lateral wall of the left orbit. This is also involving the orbital floor and inferior orbital foramen. No depression of the orbital floor is seen. There is also a mildly displaced fracture of the coronoid process of the mandible on the left, not previously included. This was not present on the cervical spine CT dated 04/14/2016. Head cervical spine CT dated 04/14/2016. Orbits: Left orbital floor and lateral wall fractures, as described above, with no intraorbital abnormalities seen. Sinuses: Tiny left maxillary sinus retention cyst. Soft tissues: Unremarkable. Limited intracranial: Unremarkable. IMPRESSION: 1. Previously noted chronic left facial bone fractures, as described above. 2. Mildly displaced fracture of the coronoid process of the mandible on the left, not included on 09/12/2018 and new since 04/14/2016. Electronically Signed   By: Beckie Salts M.D.   On: 09/21/2018 18:57    Procedures Procedures (including critical care time)  Medications Ordered in ED Medications  ketorolac (TORADOL) injection 60 mg (60 mg Intramuscular Given 09/21/18 1757)     Initial Impression / Assessment and Plan / ED Course  I have reviewed the triage vital signs and the nursing notes.  Pertinent labs & imaging results that were available during my care of the patient were reviewed by me and considered in my medical  decision making (see chart for details).       Patient referred to maxillofacial trauma to the fact that he does have a coronoid process of the mandible fracture.  I have advised patient to return here as needed.   Final Clinical Impressions(s) / ED Diagnoses   Final diagnoses:  Closed fracture of left coronoid process of mandible, initial encounter [facility])    ED Discharge Orders         Ordered    ibuprofen (ADVIL) 800 MG tablet  Every 8 hours PRN,   Status:  Discontinued     09/21/18 1944    traMADol (ULTRAM) 50 MG tablet  Every 6 hours PRN,   Status:  Discontinued     09/21/18 1944    traMADol (ULTRAM) 50 MG tablet  Every 6 hours PRN     09/21/18 1949    ibuprofen (ADVIL)  800 MG tablet  Every 8 hours PRN     09/21/18 1949           Charlestine NightLawyer, Katalyna Socarras, PA-C 09/22/18 0105    Virgina Norfolkuratolo, Adam, DO 09/22/18 [phone]

## 2018-10-24 ENCOUNTER — Other Ambulatory Visit: Payer: Self-pay

## 2018-10-24 ENCOUNTER — Encounter (HOSPITAL_COMMUNITY): Payer: Self-pay

## 2018-10-24 ENCOUNTER — Ambulatory Visit (HOSPITAL_COMMUNITY): Payer: Self-pay

## 2018-10-24 ENCOUNTER — Emergency Department (HOSPITAL_COMMUNITY)
Admission: EM | Admit: 2018-10-24 | Discharge: 2018-10-24 | Disposition: A | Discharge status: Home / Self Care | Payer: No Typology Code available for payment source | Attending: Emergency Medicine | Admitting: Emergency Medicine

## 2018-10-24 DIAGNOSIS — Z794 Long term (current) use of insulin: Secondary | ICD-10-CM | POA: Insufficient documentation

## 2018-10-24 DIAGNOSIS — F17210 Nicotine dependence, cigarettes, uncomplicated: Secondary | ICD-10-CM | POA: Diagnosis not present

## 2018-10-24 DIAGNOSIS — F1110 Opioid abuse, uncomplicated: Secondary | ICD-10-CM

## 2018-10-24 DIAGNOSIS — G8911 Acute pain due to trauma: Secondary | ICD-10-CM | POA: Diagnosis present

## 2018-10-24 DIAGNOSIS — E119 Type 2 diabetes mellitus without complications: Secondary | ICD-10-CM | POA: Insufficient documentation

## 2018-10-24 DIAGNOSIS — Z79899 Other long term (current) drug therapy: Secondary | ICD-10-CM | POA: Diagnosis not present

## 2018-10-24 DIAGNOSIS — F1120 Opioid dependence, uncomplicated: Secondary | ICD-10-CM | POA: Diagnosis not present

## 2018-10-24 NOTE — Patient Outreach (Signed)
William Acevedo, CPSS and William Acevedo, CPSS met with the patient in the [facility] ED prior to discharge to provide substance use recovery support and provide information for substance use recovery resources. Patient recently started using heroin and wants help with getting connected to opiate detox resources at this time. Patient's options for residential detox are limited due to his [place] insurance status. CPSS talked to the patient about the about the [facility] as an option for detox/residential substance use treatment while also provided a flier detailing their services/follow up information. CPSS also provided the patient with a detox center list and explained to the patient what resources work with [employer]. CPSS also provided a [facility] NA meeting list and CPSS contact information. CPSS strongly encouraged the patient to follow up with CPSS if needed for further help with getting connected to substance use recovery resources after discharge from the [facility].

## 2018-10-24 NOTE — ED Triage Notes (Signed)
Patient reports that he used heroin on Monday and is having generalized pain. States that he thinks he may need to detox. Alert and oriented, NAD

## 2018-10-24 NOTE — ED Provider Notes (Signed)
[facility] EMERGENCY DEPARTMENT Provider Note   CSN: [id number] Arrival date & time: 10/24/18  0701     History   Chief Complaint No chief complaint on file.   HPI William Acevedo is a 63 y.o. male.     HPI   63 year old male presents today for heroin withdrawal.  Patient notes that he recently started using heroin, he notes he is snorting it.  He is doing this because he has pain in his face from a facial fracture.  Patient notes his last heroin use was 2 days ago.  He notes he is going through withdrawals typical of previous, he notes this causes upset stomach body aches.  He denies any fever cough.  He denies any significant abdominal pain.  Past Medical History:  Diagnosis Date  . Carpal tunnel syndrome   . Depression   . Diabetes mellitus without complication ([place])   . Neuropathy     Patient Active Problem List   Diagnosis Date Noted  . Overdose 07/06/2015  . AKI (acute kidney injury) ([place]) 07/06/2015  . Polysubstance abuse ([place]) 07/06/2015  . DM2 (diabetes mellitus, type 2) ([place]) 07/06/2015  . Dehydration   . MDD (major depressive disorder), recurrent episode, mild ([place])   . Uncomplicated alcohol dependence ([place])     Past Surgical History:  Procedure Laterality Date  . TESTICLE TORSION REDUCTION          Home Medications    Prior to Admission medications   Medication Sig Start Date End Date Taking? Authorizing Provider  gabapentin (NEURONTIN) 100 MG capsule Take 1 capsule (100 mg total) by mouth 3 (three) times daily. Patient taking differently: Take 600 mg by mouth at bedtime.  12/02/13   Muthersbaugh, [name], PA-C  HYDROcodone-acetaminophen (NORCO) 5-325 MG tablet Take 2 tablets by mouth every 4 (four) hours as needed. Patient not taking: Reported on 08/26/2016 10/22/15   Daleen Bo, MD  ibuprofen (ADVIL) 800 MG tablet Take 1 tablet (800 mg total) by mouth every 8 (eight) hours as needed. 09/21/18   Lawyer, [name], PA-C   insulin glargine (LANTUS) 100 UNIT/ML injection Inject 40 Units into the skin daily. Reported on 07/05/2015    [provider]  ketoconazole (NIZORAL) 2 % cream Apply 1 fingertip amount to each foot daily. Patient not taking: Reported on 09/21/2018 08/10/17   [name], DPM  metFORMIN (GLUCOPHAGE) 500 MG tablet Take 500 mg by mouth 2 (two) times daily with a meal.    [provider]  traMADol (ULTRAM) 50 MG tablet Take 1 tablet (50 mg total) by mouth every 6 (six) hours as needed for severe pain. 09/21/18   Lawyer, [name], PA-C    Family History No family history on file.  Social History Social History   Tobacco Use  . Smoking status: Current Every Day Smoker    Packs/day: 0.50    Types: Cigarettes  . Smokeless tobacco: Never Used  Substance Use Topics  . Alcohol use: Yes  . Drug use: Yes    Types: Cocaine     Allergies   Patient has no known allergies.   Review of Systems Review of Systems  All other systems reviewed and are negative.    Physical Exam Updated Vital Signs BP 136/82 (BP Location: Left Arm)   Pulse 94   Temp 98.7 F (37.1 C) (Oral)   Resp 14   SpO2 99%   Physical Exam Vitals signs and nursing note reviewed.  Constitutional:  Appearance: He is well-developed.  HENT:     Head: Normocephalic and atraumatic.  Eyes:     General: No scleral icterus.       Right eye: No discharge.        Left eye: No discharge.     Conjunctiva/sclera: Conjunctivae normal.     Pupils: Pupils are equal, round, and reactive to light.  Neck:     Musculoskeletal: Normal range of motion.     Vascular: No JVD.     Trachea: No tracheal deviation.  Pulmonary:     Effort: Pulmonary effort is normal.     Breath sounds: No stridor.  Abdominal:     General: There is no distension.     Palpations: Abdomen is soft.     Tenderness: There is no abdominal tenderness.  Musculoskeletal:     Comments: Joint compartments are soft  Neurological:      Mental Status: He is alert and oriented to person, place, and time.     Coordination: Coordination normal.  Psychiatric:        Behavior: Behavior normal.        Thought Content: Thought content normal.        Judgment: Judgment normal.     ED Treatments / Results  Labs (all labs ordered are listed, but only abnormal results are displayed) Labs Reviewed - No data to display  EKG None  Radiology No results found.  Procedures Procedures (including critical care time)  Medications Ordered in ED Medications - No data to display   Initial Impression / Assessment and Plan / ED Course  I have reviewed the triage vital signs and the nursing notes.  Pertinent labs & imaging results that were available during my care of the patient were reviewed by me and considered in my medical decision making (see chart for details).        Assessment/Plan: 63 year old male presents today for heroin withdrawal symptoms.  He is very well-appearing no acute distress.  Peer support consulted, patient given outpatient follow-up resources and return precautions.  Verbalized understanding and agreement to today's plan.    Final Clinical Impressions(s) / ED Diagnoses   Final diagnoses:  Heroin abuse [facility])    ED Discharge Orders    None       Rosalio Loud 10/24/18 1219    Alvira Monday, MD 10/24/18 2118

## 2018-10-24 NOTE — Discharge Instructions (Addendum)
Please read attached information. If you experience any new or worsening signs or symptoms please return to the emergency room for evaluation. Please follow-up with your primary care provider or specialist as discussed.  °

## 2018-10-24 NOTE — ED Notes (Signed)
Peer support in room talking with patient, AVS provided.  No further questions

## 2019-01-18 ENCOUNTER — Encounter (HOSPITAL_COMMUNITY): Payer: Self-pay | Admitting: Emergency Medicine

## 2019-01-18 ENCOUNTER — Emergency Department (HOSPITAL_COMMUNITY)
Admission: EM | Admit: 2019-01-18 | Discharge: 2019-01-18 | Disposition: A | Discharge status: Home / Self Care | Payer: No Typology Code available for payment source | Attending: Emergency Medicine | Admitting: Emergency Medicine

## 2019-01-18 ENCOUNTER — Emergency Department (HOSPITAL_COMMUNITY): Payer: No Typology Code available for payment source

## 2019-01-18 DIAGNOSIS — E119 Type 2 diabetes mellitus without complications: Secondary | ICD-10-CM | POA: Diagnosis not present

## 2019-01-18 DIAGNOSIS — R079 Chest pain, unspecified: Secondary | ICD-10-CM | POA: Insufficient documentation

## 2019-01-18 DIAGNOSIS — Z794 Long term (current) use of insulin: Secondary | ICD-10-CM | POA: Insufficient documentation

## 2019-01-18 DIAGNOSIS — Z79899 Other long term (current) drug therapy: Secondary | ICD-10-CM | POA: Insufficient documentation

## 2019-01-18 DIAGNOSIS — R55 Syncope and collapse: Secondary | ICD-10-CM | POA: Insufficient documentation

## 2019-01-18 DIAGNOSIS — M542 Cervicalgia: Secondary | ICD-10-CM | POA: Insufficient documentation

## 2019-01-18 DIAGNOSIS — F17210 Nicotine dependence, cigarettes, uncomplicated: Secondary | ICD-10-CM | POA: Diagnosis not present

## 2019-01-18 DIAGNOSIS — R519 Headache, unspecified: Secondary | ICD-10-CM | POA: Insufficient documentation

## 2019-01-18 LAB — URINALYSIS, ROUTINE W REFLEX MICROSCOPIC
Bilirubin Urine: NEGATIVE
Glucose, UA: >=500 mg/dL — AB
Hgb urine dipstick: NEGATIVE
Ketones, ur: 5 mg/dL — AB
Leukocytes,Ua: NEGATIVE
Nitrite: NEGATIVE
Protein, ur: 100 mg/dL — AB
Specific Gravity, Urine: 1.023 (ref 1.005–1.030)
pH: 5 (ref 5.0–8.0)

## 2019-01-18 LAB — CBC
HCT: 42.8 % (ref 39.0–52.0)
Hemoglobin: 14 g/dL (ref 13.0–17.0)
MCH: 29.3 pg (ref 26.0–34.0)
MCHC: 32.7 g/dL (ref 30.0–36.0)
MCV: 89.5 fL (ref 80.0–100.0)
Platelets: 270 10*3/uL (ref 150–400)
RBC: 4.78 MIL/uL (ref 4.22–5.81)
RDW: 11.8 % (ref 11.5–15.5)
WBC: 7.7 10*3/uL (ref 4.0–10.5)
nRBC: 0 % (ref 0.0–0.2)

## 2019-01-18 LAB — BASIC METABOLIC PANEL
Anion gap: 19 — ABNORMAL HIGH (ref 5–15)
BUN: 17 mg/dL (ref 8–23)
CO2: 19 mmol/L — ABNORMAL LOW (ref 22–32)
Calcium: 9.7 mg/dL (ref 8.9–10.3)
Chloride: 98 mmol/L (ref 98–111)
Creatinine, Ser: 1.28 mg/dL — ABNORMAL HIGH (ref 0.61–1.24)
GFR calc Af Amer: >60 mL/min (ref >60)
GFR calc non Af Amer: 59 mL/min — ABNORMAL LOW (ref >60)
Glucose, Bld: 345 mg/dL — ABNORMAL HIGH (ref 70–99)
Potassium: 4.1 mmol/L (ref 3.5–5.1)
Sodium: 136 mmol/L (ref 135–145)

## 2019-01-18 LAB — CBG MONITORING, ED: Glucose-Capillary: 366 mg/dL — ABNORMAL HIGH (ref 70–99)

## 2019-01-18 LAB — TROPONIN I (HIGH SENSITIVITY)
Troponin I (High Sensitivity): 10 ng/L (ref <18)
Troponin I (High Sensitivity): 7 ng/L (ref <18)

## 2019-01-18 MED ORDER — CYCLOBENZAPRINE HCL 10 MG PO TABS
10.0000 mg | ORAL_TABLET | Freq: Once | ORAL | Status: AC
  Administered 2019-01-18: 16:00:00 10 mg via ORAL
  Filled 2019-01-18: qty 1

## 2019-01-18 MED ORDER — CYCLOBENZAPRINE HCL 10 MG PO TABS: 10.0000 mg | ORAL_TABLET | Freq: Two times a day (BID) | ORAL | 0 refills | Status: DC | PRN

## 2019-01-18 MED ORDER — MORPHINE SULFATE (PF) 4 MG/ML IV SOLN
4.0000 mg | Freq: Once | INTRAVENOUS | Status: AC
  Administered 2019-01-18: 13:00:00 4 mg via INTRAVENOUS
  Filled 2019-01-18: qty 1

## 2019-01-18 MED ORDER — SODIUM CHLORIDE 0.9 % IV BOLUS
1000.0000 mL | Freq: Once | INTRAVENOUS | Status: AC
  Administered 2019-01-18: 12:00:00 1000 mL via INTRAVENOUS

## 2019-01-18 MED ORDER — SODIUM CHLORIDE 0.9% FLUSH: 3.0000 mL | Freq: Once | INTRAVENOUS | Status: DC

## 2019-01-18 MED ORDER — MORPHINE SULFATE (PF) 4 MG/ML IV SOLN
4.0000 mg | Freq: Once | INTRAVENOUS | Status: AC
  Administered 2019-01-18: 12:00:00 4 mg via INTRAVENOUS
  Filled 2019-01-18: qty 1

## 2019-01-18 NOTE — ED Notes (Signed)
Pt walking without assistance on steady gait on the lobby

## 2019-01-18 NOTE — ED Provider Notes (Signed)
[facility] EMERGENCY DEPARTMENT Provider Note   CSN: [id number] Arrival date & time: 01/18/19  6010     History Chief Complaint  Patient presents with  . Loss of Consciousness    William Acevedo is a 64 y.o. male.  The history is provided by the patient and medical records. The history is limited by the condition of the patient. No language interpreter was used.  Neck Injury This is a new problem. The current episode started 3 to 5 hours ago. The problem occurs constantly. The problem has not changed since onset.Associated symptoms include chest pain and headaches. Pertinent negatives include no abdominal pain and no shortness of breath. Nothing aggravates the symptoms. Nothing relieves the symptoms. He has tried nothing for the symptoms. The treatment provided no relief.       Past Medical History:  Diagnosis Date  . Carpal tunnel syndrome   . Depression   . Diabetes mellitus without complication (HCC)   . Neuropathy     Patient Active Problem List   Diagnosis Date Noted  . Overdose 07/06/2015  . AKI (acute kidney injury) (HCC) 07/06/2015  . Polysubstance abuse (HCC) 07/06/2015  . DM2 (diabetes mellitus, type 2) (HCC) 07/06/2015  . Dehydration   . MDD (major depressive disorder), recurrent episode, mild (HCC)   . Uncomplicated alcohol dependence (HCC)     Past Surgical History:  Procedure Laterality Date  . TESTICLE TORSION REDUCTION         No family history on file.  Social History   Tobacco Use  . Smoking status: Current Every Day Smoker    Packs/day: 0.50    Types: Cigarettes  . Smokeless tobacco: Never Used  Substance Use Topics  . Alcohol use: Yes  . Drug use: Yes    Types: Cocaine    Home Medications Prior to Admission medications   Medication Sig Start Date End Date Taking? Authorizing Provider  gabapentin (NEURONTIN) 100 MG capsule Take 1 capsule (100 mg total) by mouth 3 (three) times daily. Patient taking  differently: Take 600 mg by mouth at bedtime.  12/02/13   Muthersbaugh, Dahlia Client, PA-C  HYDROcodone-acetaminophen (NORCO) 5-325 MG tablet Take 2 tablets by mouth every 4 (four) hours as needed. Patient not taking: Reported on 08/26/2016 10/22/15   Mancel Bale, MD  ibuprofen (ADVIL) 800 MG tablet Take 1 tablet (800 mg total) by mouth every 8 (eight) hours as needed. 09/21/18   Lawyer, Cristal Deer, PA-C  insulin glargine (LANTUS) 100 UNIT/ML injection Inject 40 Units into the skin daily. Reported on 07/05/2015    [provider]  ketoconazole (NIZORAL) 2 % cream Apply 1 fingertip amount to each foot daily. Patient not taking: Reported on 09/21/2018 08/10/17   Park Liter, DPM  metFORMIN (GLUCOPHAGE) 500 MG tablet Take 500 mg by mouth 2 (two) times daily with a meal.    [provider]  traMADol (ULTRAM) 50 MG tablet Take 1 tablet (50 mg total) by mouth every 6 (six) hours as needed for severe pain. 09/21/18   Lawyer, Cristal Deer, PA-C    Allergies    Patient has no known allergies.  Review of Systems   Review of Systems  Constitutional: Negative for chills, diaphoresis, fatigue and fever.  HENT: Negative for congestion.   Eyes: Negative for visual disturbance.  Respiratory: Negative for cough, chest tightness, shortness of breath and wheezing.   Cardiovascular: Positive for chest pain. Negative for palpitations and leg swelling.  Gastrointestinal: Negative for abdominal pain, constipation, diarrhea, nausea  and vomiting.  Genitourinary: Negative for flank pain.  Musculoskeletal: Positive for neck pain. Negative for back pain and neck stiffness.  Skin: Negative for rash and wound.  Neurological: Positive for headaches. Negative for dizziness and light-headedness.  Psychiatric/Behavioral: Negative for agitation.  All other systems reviewed and are negative.   Physical Exam Updated Vital Signs BP 137/80 (BP Location: Right Arm)   Pulse 92   Temp 97.7 F (36.5 C) (Oral)    Resp 16   SpO2 100%   Physical Exam Vitals and nursing note reviewed.  Constitutional:      General: He is not in acute distress.    Appearance: He is well-developed. He is not ill-appearing, toxic-appearing or diaphoretic.  HENT:     Head: Normocephalic and atraumatic.     Nose: No congestion or rhinorrhea.     Mouth/Throat:     Mouth: Mucous membranes are moist.     Pharynx: No oropharyngeal exudate or posterior oropharyngeal erythema.  Eyes:     Extraocular Movements: Extraocular movements intact.     Conjunctiva/sclera: Conjunctivae normal.     Pupils: Pupils are equal, round, and reactive to light.  Neck:   Cardiovascular:     Rate and Rhythm: Normal rate and regular rhythm.     Pulses: Normal pulses.     Heart sounds: No murmur.  Pulmonary:     Effort: Pulmonary effort is normal. No respiratory distress.     Breath sounds: Normal breath sounds. No wheezing, rhonchi or rales.  Chest:     Chest wall: Tenderness present.  Abdominal:     General: Abdomen is flat.     Palpations: Abdomen is soft.     Tenderness: There is no abdominal tenderness. There is no right CVA tenderness, left CVA tenderness or guarding.  Musculoskeletal:        General: Tenderness present.     Cervical back: Signs of trauma and tenderness present. No erythema. Spinous process tenderness and muscular tenderness present.     Right lower leg: No edema.     Left lower leg: No edema.  Skin:    General: Skin is warm and dry.     Capillary Refill: Capillary refill takes less than 2 seconds.     Findings: No erythema or rash.  Neurological:     General: No focal deficit present.     Mental Status: He is alert.     Sensory: No sensory deficit.     Motor: No weakness.  Psychiatric:        Mood and Affect: Mood normal.     ED Results / Procedures / Treatments   Labs (all labs ordered are listed, but only abnormal results are displayed) Labs Reviewed  BASIC METABOLIC PANEL - Abnormal; Notable for  the following components:      Result Value   CO2 19 (*)    Glucose, Bld 345 (*)    Creatinine, Ser 1.28 (*)    GFR calc non Af Amer 59 (*)    Anion gap 19 (*)    All other components within normal limits  URINALYSIS, ROUTINE W REFLEX MICROSCOPIC - Abnormal; Notable for the following components:   Glucose, UA >=500 (*)    Ketones, ur 5 (*)    Protein, ur 100 (*)    Bacteria, UA RARE (*)    All other components within normal limits  CBG MONITORING, ED - Abnormal; Notable for the following components:   Glucose-Capillary 366 (*)    All other  components within normal limits  URINE CULTURE  CBC  TROPONIN I (HIGH SENSITIVITY)  TROPONIN I (HIGH SENSITIVITY)    EKG EKG Interpretation  Date/Time:  Friday January 18 2019 08:30:15 EST Ventricular Rate:  89 PR Interval:  146 QRS Duration: 76 QT Interval:  364 QTC Calculation: 442 R Axis:   41 Text Interpretation: Sinus rhythm with occasional Premature ventricular complexes Right atrial enlargement Borderline ECG When compred to prior, no significant changes seen. No STEMI Confirmed by Theda Belfast ([id number]) on 01/18/2019 11:09:49 AM   Radiology DG Chest 2 View  Result Date: 01/18/2019 CLINICAL DATA:  Chest pain. Syncope. Possible fall. Diabetes. Smoker. EXAM: CHEST - 2 VIEW COMPARISON:  07/19/2017 FINDINGS: Mild hyperinflation. Lateral view degraded by patient arm position. Midline trachea. Normal heart size. Atherosclerosis in the transverse aorta. No pleural effusion or pneumothorax. Clear lungs. IMPRESSION: No acute cardiopulmonary disease. Aortic Atherosclerosis (ICD10-I70.0). Electronically Signed   By: Jeronimo Greaves M.D.   On: 01/18/2019 12:17   CT Head Wo Contrast  Result Date: 01/18/2019 CLINICAL DATA:  Unwitnessed fall. EXAM: CT HEAD WITHOUT CONTRAST CT CERVICAL SPINE WITHOUT CONTRAST TECHNIQUE: Multidetector CT imaging of the head and cervical spine was performed following the standard protocol without intravenous contrast.  Multiplanar CT image reconstructions of the cervical spine were also generated. COMPARISON:  September 12, 2018.  April 14, 2016. FINDINGS: CT HEAD FINDINGS Brain: No evidence of acute infarction, hemorrhage, hydrocephalus, extra-axial collection or mass lesion/mass effect. Vascular: No hyperdense vessel or unexpected calcification. Skull: Old left-sided zygomatic arch fracture is again noted. No acute osseous abnormality is noted. Sinuses/Orbits: No acute finding. Other: None. CT CERVICAL SPINE FINDINGS Alignment: Normal. Skull base and vertebrae: No acute fracture. No primary bone lesion or focal pathologic process. Soft tissues and spinal canal: No prevertebral fluid or swelling. No visible canal hematoma. Disc levels: Mild degenerative disc disease is noted at C5-6 and C6-7 with anterior osteophyte formation. Upper chest: Negative. Other: None. IMPRESSION: No acute intracranial abnormality seen. Mild degenerative disc disease is noted at C5-6 and C6-7. No acute abnormality seen in the cervical spine. Electronically Signed   By: Lupita Raider M.D.   On: 01/18/2019 13:20   CT Cervical Spine Wo Contrast  Result Date: 01/18/2019 CLINICAL DATA:  Unwitnessed fall. EXAM: CT HEAD WITHOUT CONTRAST CT CERVICAL SPINE WITHOUT CONTRAST TECHNIQUE: Multidetector CT imaging of the head and cervical spine was performed following the standard protocol without intravenous contrast. Multiplanar CT image reconstructions of the cervical spine were also generated. COMPARISON:  September 12, 2018.  April 14, 2016. FINDINGS: CT HEAD FINDINGS Brain: No evidence of acute infarction, hemorrhage, hydrocephalus, extra-axial collection or mass lesion/mass effect. Vascular: No hyperdense vessel or unexpected calcification. Skull: Old left-sided zygomatic arch fracture is again noted. No acute osseous abnormality is noted. Sinuses/Orbits: No acute finding. Other: None. CT CERVICAL SPINE FINDINGS Alignment: Normal. Skull base and vertebrae: No  acute fracture. No primary bone lesion or focal pathologic process. Soft tissues and spinal canal: No prevertebral fluid or swelling. No visible canal hematoma. Disc levels: Mild degenerative disc disease is noted at C5-6 and C6-7 with anterior osteophyte formation. Upper chest: Negative. Other: None. IMPRESSION: No acute intracranial abnormality seen. Mild degenerative disc disease is noted at C5-6 and C6-7. No acute abnormality seen in the cervical spine. Electronically Signed   By: Lupita Raider M.D.   On: 01/18/2019 13:20    Procedures Procedures (including critical care time)  Medications Ordered in ED Medications  sodium chloride flush (  NS) 0.9 % injection 3 mL (3 mLs Intravenous Not Given 01/18/19 1147)  sodium chloride 0.9 % bolus 1,000 mL (0 mLs Intravenous Stopped 01/18/19 1557)  morphine 4 MG/ML injection 4 mg (4 mg Intravenous Given 01/18/19 1146)  morphine 4 MG/ML injection 4 mg (4 mg Intravenous Given 01/18/19 1319)  cyclobenzaprine (FLEXERIL) tablet 10 mg (10 mg Oral Given 01/18/19 1620)    ED Course  I have reviewed the triage vital signs and the nursing notes.  Pertinent labs & imaging results that were available during my care of the patient were reviewed by me and considered in my medical decision making (see chart for details).    MDM Rules/Calculators/A&P                      William Acevedo is a 64 y.o. male with a past medical history significant for diabetes, neuropathies, depression, and polysubstance abuse who presents with headache, neck pain, and chest pain after a syncopal episode/fall.  Patient reports that he was feeling normal health yesterday and drank a bottle of liquor and smoked some crack cocaine for New Year's.  He reports that this morning, he does not member what happened but remembers being in the ambulance coming to the emergency department.  According to EMS report from bystander, patient was found unresponsive on the floor after an "unknown period of  time".  Patient also allegedly had a fall of some kind after loud noise was heard by the roommate and he was found on the ground.  He is complaining of severe neck pain going into his occiput and back of his head.  He also is complaining of a small amount of chest discomfort with palpation.  He denies fevers, chills congestion, cough.  Denies any nausea, vomiting, urinary symptoms or GI symptoms.  Denies any extremity symptoms.  No loss of bowel or bladder control.  No other complaints.  On exam, patient does have tenderness across his neck both midline and laterally.  He does not have tenderness in the mid or low back.  Chest is slightly tender in the sternum and right chest.  Lungs clear no murmur appreciated.  Abdomen nontender.  Patient moving all extremities with normal sensation and strength throughout.  Patient did not have anterior neck tenderness.  No facial droop and pupils are symmetric and reactive.  Clear speech.  Patient is in severe pain.  Clinically patient may have injured himself with a syncope versus fall.  It appears the patient has had history of dehydration in the past, suspect patient may been dehydrated with his polysubstance abuse last night and had a syncopal episode leading him to fall.  Will get CT of the head and neck as well as chest x-ray and labs.  Will give fluids for rehydration given his history of dehydration and the possible syncope.  EKG shows no STEMI.  Anticipate reassessment after work-up.  Will give pain medicine initially.  Work-up was overall reassuring.  Urinalysis does not show infection.  Troponin negative x2.  BMP shows slight elevation in creatinine likely due to dehydration as we discussed from his drinking alcohol and decreased fluid intake overnight.  CBC reassuring.  CT head and neck did not show any acute injuries.  Chest x-ray reassuring.  Suspect musculoskeletal discomfort and muscle spasms causing his discomfort.  Had a long shared decision made  conversation with patient and we agreed to provide prescription for muscle relaxant and have him discharged home.  He will  bring more fluids and will follow with PCP.  We encouraged him to avoid cocaine and alcohol use.  Patient blood pressure was more elevated before discharge and we offered him blood pressure medications and observation but he would rather be discharged home.  He understood return precautions and was discharged in good condition.  Final Clinical Impression(s) / ED Diagnoses Final diagnoses:  Neck pain  Syncope, unspecified syncope type    Rx / DC Orders ED Discharge Orders         Ordered    cyclobenzaprine (FLEXERIL) 10 MG tablet  2 times daily PRN     01/18/19 1604         Clinical Impression: 1. Neck pain   2. Syncope, unspecified syncope type     Disposition: Discharge  Condition: Good  I have discussed the results, Dx and Tx plan with the pt(& family if present). He/she/they expressed understanding and agree(s) with the plan. Discharge instructions discussed at great length. Strict return precautions discussed and pt &/or family have verbalized understanding of the instructions. No further questions at time of discharge.    Discharge Medication List as of 01/18/2019  4:05 PM    START taking these medications   Details  cyclobenzaprine (FLEXERIL) 10 MG tablet Take 1 tablet (10 mg total) by mouth 2 (two) times daily as needed for muscle spasms., Starting Fri 01/18/2019, Print        Follow Up: Priscille KluverZekan, Patricia J, MD [address] [place] [id number] [phone]     [facility] [address] [id number] [place] [id number] [phone]       Katerra Ingman, Canary Brimhristopher J, MD 01/18/19 [phone]

## 2019-01-18 NOTE — ED Triage Notes (Signed)
Pt arrives via for a unwitnessed syncopal event. Pts room mate heard a loud noise and found pt on floor and was unable to wake him up for a "unknown period of time" per ems. However on ems arrival pt was standing and alert. Pt had states he upper back and neck are sore- pt arrives in collar. Denies blood thinner.

## 2019-01-18 NOTE — ED Notes (Signed)
Pt given food and drink per Dr. Rush Landmark

## 2019-01-18 NOTE — ED Notes (Signed)
Pt reminded that a urine sample is needed; urinal at bedside

## 2019-01-18 NOTE — ED Notes (Signed)
Pt observed to not be wearing C collar, despite neck pain; pt states he took it off the lobby because it was "annoying"; pt educated on the importance of keeping c-collar in place until scans are resulted; pt agreeable, new c-collar placed on pt

## 2019-01-18 NOTE — Discharge Instructions (Signed)
Your work-up today was overall reassuring and I suspect you are somewhat dehydrated after the New Year's celebration last night we discussed.  Your imaging did not show any acute injury or fractures in your head or neck.  No bleeding seen.  Your kidney function was slightly more elevated likely due to dehydration.  Please maintain hydration and you may use the muscle relaxant to help with the muscular discomfort as you are having muscle spasms and tenderness on exam.  Please rest.  If any symptoms change or worsen, please return to the nearest emergency department.

## 2019-01-18 NOTE — ED Notes (Signed)
Patient transported to X-ray 

## 2019-01-18 NOTE — ED Notes (Signed)
Pt ambulatory in hall with steady gait 

## 2019-01-19 LAB — URINE CULTURE: Culture: NO GROWTH

## 2019-04-06 ENCOUNTER — Inpatient Hospital Stay (HOSPITAL_COMMUNITY)
Admission: EM | Admit: 2019-04-06 | Discharge: 2019-04-08 | DRG: 153 | Disposition: A | Discharge status: Home / Self Care | Payer: Medicare (Managed Care) | Attending: Internal Medicine | Admitting: Internal Medicine

## 2019-04-06 ENCOUNTER — Emergency Department (HOSPITAL_COMMUNITY): Payer: Medicare (Managed Care)

## 2019-04-06 ENCOUNTER — Other Ambulatory Visit: Payer: Self-pay

## 2019-04-06 ENCOUNTER — Encounter (HOSPITAL_COMMUNITY): Payer: Self-pay | Admitting: Emergency Medicine

## 2019-04-06 DIAGNOSIS — J0510 Acute epiglottitis without obstruction: Secondary | ICD-10-CM

## 2019-04-06 DIAGNOSIS — F1090 Alcohol use, unspecified, uncomplicated: Secondary | ICD-10-CM | POA: Diagnosis present

## 2019-04-06 DIAGNOSIS — E119 Type 2 diabetes mellitus without complications: Secondary | ICD-10-CM

## 2019-04-06 DIAGNOSIS — R4182 Altered mental status, unspecified: Secondary | ICD-10-CM

## 2019-04-06 DIAGNOSIS — F329 Major depressive disorder, single episode, unspecified: Secondary | ICD-10-CM | POA: Diagnosis present

## 2019-04-06 DIAGNOSIS — T50901A Poisoning by unspecified drugs, medicaments and biological substances, accidental (unintentional), initial encounter: Secondary | ICD-10-CM | POA: Diagnosis present

## 2019-04-06 DIAGNOSIS — G9340 Encephalopathy, unspecified: Secondary | ICD-10-CM | POA: Diagnosis present

## 2019-04-06 DIAGNOSIS — F1210 Cannabis abuse, uncomplicated: Secondary | ICD-10-CM | POA: Diagnosis present

## 2019-04-06 DIAGNOSIS — F1020 Alcohol dependence, uncomplicated: Secondary | ICD-10-CM | POA: Diagnosis present

## 2019-04-06 DIAGNOSIS — F17210 Nicotine dependence, cigarettes, uncomplicated: Secondary | ICD-10-CM | POA: Diagnosis present

## 2019-04-06 DIAGNOSIS — F1420 Cocaine dependence, uncomplicated: Secondary | ICD-10-CM | POA: Diagnosis present

## 2019-04-06 DIAGNOSIS — Y900 Blood alcohol level of less than 20 mg/100 ml: Secondary | ICD-10-CM | POA: Diagnosis present

## 2019-04-06 DIAGNOSIS — Z794 Long term (current) use of insulin: Secondary | ICD-10-CM

## 2019-04-06 DIAGNOSIS — E1151 Type 2 diabetes mellitus with diabetic peripheral angiopathy without gangrene: Secondary | ICD-10-CM | POA: Diagnosis present

## 2019-04-06 DIAGNOSIS — Z79891 Long term (current) use of opiate analgesic: Secondary | ICD-10-CM

## 2019-04-06 DIAGNOSIS — Z791 Long term (current) use of non-steroidal anti-inflammatories (NSAID): Secondary | ICD-10-CM

## 2019-04-06 DIAGNOSIS — F1423 Cocaine dependence with withdrawal: Secondary | ICD-10-CM | POA: Diagnosis present

## 2019-04-06 DIAGNOSIS — F1490 Cocaine use, unspecified, uncomplicated: Secondary | ICD-10-CM | POA: Diagnosis present

## 2019-04-06 DIAGNOSIS — Z20822 Contact with and (suspected) exposure to covid-19: Secondary | ICD-10-CM | POA: Diagnosis present

## 2019-04-06 DIAGNOSIS — F330 Major depressive disorder, recurrent, mild: Secondary | ICD-10-CM | POA: Diagnosis present

## 2019-04-06 DIAGNOSIS — Z79899 Other long term (current) drug therapy: Secondary | ICD-10-CM

## 2019-04-06 DIAGNOSIS — F1910 Other psychoactive substance abuse, uncomplicated: Secondary | ICD-10-CM | POA: Diagnosis present

## 2019-04-06 DIAGNOSIS — E1165 Type 2 diabetes mellitus with hyperglycemia: Secondary | ICD-10-CM | POA: Diagnosis present

## 2019-04-06 DIAGNOSIS — F1010 Alcohol abuse, uncomplicated: Secondary | ICD-10-CM | POA: Diagnosis present

## 2019-04-06 LAB — CBC WITH DIFFERENTIAL/PLATELET
Abs Immature Granulocytes: 0.02 10*3/uL (ref 0.00–0.07)
Basophils Absolute: 0 10*3/uL (ref 0.0–0.1)
Basophils Relative: 0 %
Eosinophils Absolute: 0 10*3/uL (ref 0.0–0.5)
Eosinophils Relative: 0 %
HCT: 46.5 % (ref 39.0–52.0)
Hemoglobin: 15.3 g/dL (ref 13.0–17.0)
Immature Granulocytes: 0 %
Lymphocytes Relative: 15 %
Lymphs Abs: 1.5 10*3/uL (ref 0.7–4.0)
MCH: 29.3 pg (ref 26.0–34.0)
MCHC: 32.9 g/dL (ref 30.0–36.0)
MCV: 88.9 fL (ref 80.0–100.0)
Monocytes Absolute: 0.8 10*3/uL (ref 0.1–1.0)
Monocytes Relative: 8 %
Neutro Abs: 8 10*3/uL — ABNORMAL HIGH (ref 1.7–7.7)
Neutrophils Relative %: 77 %
Platelets: 297 10*3/uL (ref 150–400)
RBC: 5.23 MIL/uL (ref 4.22–5.81)
RDW: 11.9 % (ref 11.5–15.5)
WBC: 10.4 10*3/uL (ref 4.0–10.5)
nRBC: 0 % (ref 0.0–0.2)

## 2019-04-06 LAB — COMPREHENSIVE METABOLIC PANEL
ALT: 15 U/L (ref 0–44)
AST: 20 U/L (ref 15–41)
Albumin: 4.3 g/dL (ref 3.5–5.0)
Alkaline Phosphatase: 75 U/L (ref 38–126)
Anion gap: 14 (ref 5–15)
BUN: 16 mg/dL (ref 8–23)
CO2: 24 mmol/L (ref 22–32)
Calcium: 9.6 mg/dL (ref 8.9–10.3)
Chloride: 96 mmol/L — ABNORMAL LOW (ref 98–111)
Creatinine, Ser: 0.98 mg/dL (ref 0.61–1.24)
GFR calc Af Amer: >60 mL/min (ref >60)
GFR calc non Af Amer: >60 mL/min (ref >60)
Glucose, Bld: 342 mg/dL — ABNORMAL HIGH (ref 70–99)
Potassium: 4.5 mmol/L (ref 3.5–5.1)
Sodium: 134 mmol/L — ABNORMAL LOW (ref 135–145)
Total Bilirubin: 0.7 mg/dL (ref 0.3–1.2)
Total Protein: 8.4 g/dL — ABNORMAL HIGH (ref 6.5–8.1)

## 2019-04-06 LAB — CBG MONITORING, ED: Glucose-Capillary: 362 mg/dL — ABNORMAL HIGH (ref 70–99)

## 2019-04-06 LAB — SALICYLATE LEVEL: Salicylate Lvl: <7 mg/dL — ABNORMAL LOW (ref 7.0–30.0)

## 2019-04-06 LAB — ETHANOL: Alcohol, Ethyl (B): <10 mg/dL (ref <10)

## 2019-04-06 LAB — ACETAMINOPHEN LEVEL: Acetaminophen (Tylenol), Serum: <10 ug/mL — ABNORMAL LOW (ref 10–30)

## 2019-04-06 MED ORDER — LORAZEPAM 2 MG/ML IJ SOLN
2.0000 mg | Freq: Once | INTRAMUSCULAR | Status: AC
  Administered 2019-04-06: 2 mg via INTRAVENOUS
  Filled 2019-04-06: qty 1

## 2019-04-06 MED ORDER — SODIUM CHLORIDE 0.9 % IV BOLUS
1000.0000 mL | Freq: Once | INTRAVENOUS | Status: AC
  Administered 2019-04-06: 1000 mL via INTRAVENOUS

## 2019-04-06 MED ORDER — IOHEXOL 300 MG/ML  SOLN
75.0000 mL | Freq: Once | INTRAMUSCULAR | Status: AC | PRN
  Administered 2019-04-06: 75 mL via INTRAVENOUS

## 2019-04-06 NOTE — ED Triage Notes (Signed)
Pt arrived via EMS. Reports use of ETOH, cocaine, crack, marijuana use. Pt complains of not having slept in last 3 days. Pt B/P 208/120.

## 2019-04-06 NOTE — ED Notes (Signed)
Pt transported to CT ?

## 2019-04-06 NOTE — ED Provider Notes (Addendum)
[facility] EMERGENCY DEPARTMENT Provider Note   CSN: [id number] Arrival date & time: 04/06/19  2013  LEVEL 5 CAVEAT - INTOXICATION/PSYCHOSIS History Chief Complaint  Patient presents with  . Insomnia    William Acevedo is a 64 y.o. male.  HPI 64 year old male presents via EMS. History is limited as patient is a poor historian. Tells me he is here for cocaine. When asked what exactly for he doesn't expand on this. Endorses ETOH and marijuana abuse in general. Last used cocaine today. Specifically denies headache, chest pain, cough, fever or pain in general. Denies SI. Unclear if he wants detox. Nurse tells me he called EMS and has been acting "high". Has been seen giggling at times and bobbing head randomly while in the room. Asks me if there is anyone else in the room when clearly not, when I tell him no he states he's just acting paranoid.   Past Medical History:  Diagnosis Date  . Carpal tunnel syndrome   . Depression   . Diabetes mellitus without complication ([place])   . Neuropathy     Patient Active Problem List   Diagnosis Date Noted  . Overdose 07/06/2015  . AKI (acute kidney injury) ([place]) 07/06/2015  . Polysubstance abuse ([place]) 07/06/2015  . DM2 (diabetes mellitus, type 2) ([place]) 07/06/2015  . Dehydration   . MDD (major depressive disorder), recurrent episode, mild ([place])   . Uncomplicated alcohol dependence ([place])     Past Surgical History:  Procedure Laterality Date  . TESTICLE TORSION REDUCTION         History reviewed. No pertinent family history.  Social History   Tobacco Use  . Smoking status: Current Every Day Smoker    Packs/day: 0.50    Types: Cigarettes  . Smokeless tobacco: Never Used  Substance Use Topics  . Alcohol use: Yes  . Drug use: Yes    Types: Cocaine    Home Medications Prior to Admission medications   Medication Sig Start Date End Date Taking? Authorizing Provider  cyclobenzaprine (FLEXERIL) 10 MG tablet Take  1 tablet (10 mg total) by mouth 2 (two) times daily as needed for muscle spasms. 01/18/19   Tegeler, Gwenyth Allegra, MD  gabapentin (NEURONTIN) 100 MG capsule Take 1 capsule (100 mg total) by mouth 3 (three) times daily. Patient taking differently: Take 600 mg by mouth at bedtime.  12/02/13   Muthersbaugh, [name], PA-C  ibuprofen (ADVIL) 800 MG tablet Take 1 tablet (800 mg total) by mouth every 8 (eight) hours as needed. 09/21/18   Lawyer, [name], PA-C  insulin glargine (LANTUS) 100 UNIT/ML injection Inject 40 Units into the skin daily. Reported on 07/05/2015    [provider]  ketoconazole (NIZORAL) 2 % cream Apply 1 fingertip amount to each foot daily. Patient not taking: Reported on 09/21/2018 08/10/17   [name], DPM  metFORMIN (GLUCOPHAGE) 500 MG tablet Take 500 mg by mouth 2 (two) times daily with a meal.    [provider]  traMADol (ULTRAM) 50 MG tablet Take 1 tablet (50 mg total) by mouth every 6 (six) hours as needed for severe pain. 09/21/18   Lawyer, [name], PA-C    Allergies    Patient has no known allergies.  Review of Systems   Review of Systems  Unable to perform ROS: Psychiatric disorder    Physical Exam Updated Vital Signs BP 117/73   Pulse 92   Temp 99.1 F (37.3 C) (Oral)   Resp 20  Ht 5\' 6"  (1.676 m)   Wt 54.4 kg   SpO2 98%   BMI 19.37 kg/m   Physical Exam Vitals and nursing note reviewed.  Constitutional:      Appearance: He is well-developed.  HENT:     Head: Normocephalic and atraumatic.     Right Ear: External ear normal.     Left Ear: External ear normal.     Nose: Nose normal.  Eyes:     General:        Right eye: No discharge.        Left eye: No discharge.  Cardiovascular:     Rate and Rhythm: Regular rhythm. Tachycardia present.     Heart sounds: Normal heart sounds.  Pulmonary:     Effort: Pulmonary effort is normal.     Breath sounds: Normal breath sounds.  Abdominal:     Palpations: Abdomen is soft.      Tenderness: There is no abdominal tenderness.  Musculoskeletal:     Cervical back: Neck supple.  Skin:    General: Skin is warm and dry.  Neurological:     Mental Status: He is alert.     Comments: Awake, alert, oriented to person, place, time, president.  Psychiatric:        Mood and Affect: Mood is not anxious.     ED Results / Procedures / Treatments   Labs (all labs ordered are listed, but only abnormal results are displayed) Labs Reviewed  COMPREHENSIVE METABOLIC PANEL - Abnormal; Notable for the following components:      Result Value   Sodium 134 (*)    Chloride 96 (*)    Glucose, Bld 342 (*)    Total Protein 8.4 (*)    All other components within normal limits  CBC WITH DIFFERENTIAL/PLATELET - Abnormal; Notable for the following components:   Neutro Abs 8.0 (*)    All other components within normal limits  ACETAMINOPHEN LEVEL - Abnormal; Notable for the following components:   Acetaminophen (Tylenol), Serum <10 (*)    All other components within normal limits  SALICYLATE LEVEL - Abnormal; Notable for the following components:   Salicylate Lvl <7.0 (*)    All other components within normal limits  CBG MONITORING, ED - Abnormal; Notable for the following components:   Glucose-Capillary 362 (*)    All other components within normal limits  GROUP A STREP BY PCR  ETHANOL  RAPID URINE DRUG SCREEN, HOSP PERFORMED  URINALYSIS, ROUTINE W REFLEX MICROSCOPIC  TROPONIN I (HIGH SENSITIVITY)    EKG EKG Interpretation  Date/Time:  Saturday April 06 2019 20:15:15 EDT Ventricular Rate:  111 PR Interval:    QRS Duration: 84 QT Interval:  364 QTC Calculation: 495 R Axis:   77 Text Interpretation: Sinus tachycardia Biatrial enlargement Borderline prolonged QT interval similar to Jan 2021 Confirmed by Feb 2021 [phone]) on 04/06/2019 9:35:17 PM   Radiology No results found.  Procedures Procedures (including critical care time)  Medications Ordered in ED  Medications  sodium chloride 0.9 % bolus 1,000 mL (1,000 mLs Intravenous Other (enter comment in med admin window) 04/06/19 2226)  LORazepam (ATIVAN) injection 2 mg (2 mg Intravenous Given 04/06/19 2225)    ED Course  I have reviewed the triage vital signs and the nursing notes.  Pertinent labs & imaging results that were available during my care of the patient were reviewed by me and considered in my medical decision making (see chart for details).    MDM Rules/Calculators/A&P  Patient is acting strange, likely due to cocaine. He is quite hypertensive, also a little tachycardic, which I also think is from cocaine. Urinated on the floor. Is acting strange. Given IV ativan. Given his strange behavior will get CT but I think this is drug/psych related. Care to Dr. Manus Gunning.  Of note, he is afebrile, normal WBC. My suspicion of infection is quite low. Likely drug/psych related. Final Clinical Impression(s) / ED Diagnoses Final diagnoses:  None    Rx / DC Orders ED Discharge Orders    None       Pricilla Loveless, MD 04/06/19 [id number]    Pricilla Loveless, MD 04/07/19 [phone]

## 2019-04-07 ENCOUNTER — Other Ambulatory Visit: Payer: Self-pay

## 2019-04-07 DIAGNOSIS — F1020 Alcohol dependence, uncomplicated: Secondary | ICD-10-CM

## 2019-04-07 DIAGNOSIS — G9340 Encephalopathy, unspecified: Secondary | ICD-10-CM

## 2019-04-07 DIAGNOSIS — R4182 Altered mental status, unspecified: Secondary | ICD-10-CM | POA: Diagnosis not present

## 2019-04-07 DIAGNOSIS — Z79891 Long term (current) use of opiate analgesic: Secondary | ICD-10-CM | POA: Diagnosis not present

## 2019-04-07 DIAGNOSIS — F1423 Cocaine dependence with withdrawal: Secondary | ICD-10-CM | POA: Diagnosis present

## 2019-04-07 DIAGNOSIS — F320 Major depressive disorder, single episode, mild: Secondary | ICD-10-CM | POA: Diagnosis not present

## 2019-04-07 DIAGNOSIS — J0510 Acute epiglottitis without obstruction: Secondary | ICD-10-CM | POA: Diagnosis present

## 2019-04-07 DIAGNOSIS — F330 Major depressive disorder, recurrent, mild: Secondary | ICD-10-CM

## 2019-04-07 DIAGNOSIS — Y900 Blood alcohol level of less than 20 mg/100 ml: Secondary | ICD-10-CM | POA: Diagnosis present

## 2019-04-07 DIAGNOSIS — E1165 Type 2 diabetes mellitus with hyperglycemia: Secondary | ICD-10-CM

## 2019-04-07 DIAGNOSIS — F17210 Nicotine dependence, cigarettes, uncomplicated: Secondary | ICD-10-CM | POA: Diagnosis present

## 2019-04-07 DIAGNOSIS — E118 Type 2 diabetes mellitus with unspecified complications: Secondary | ICD-10-CM

## 2019-04-07 DIAGNOSIS — F1420 Cocaine dependence, uncomplicated: Secondary | ICD-10-CM | POA: Diagnosis not present

## 2019-04-07 DIAGNOSIS — F1490 Cocaine use, unspecified, uncomplicated: Secondary | ICD-10-CM

## 2019-04-07 DIAGNOSIS — E1151 Type 2 diabetes mellitus with diabetic peripheral angiopathy without gangrene: Secondary | ICD-10-CM | POA: Diagnosis present

## 2019-04-07 DIAGNOSIS — F1010 Alcohol abuse, uncomplicated: Secondary | ICD-10-CM | POA: Diagnosis present

## 2019-04-07 DIAGNOSIS — F1910 Other psychoactive substance abuse, uncomplicated: Secondary | ICD-10-CM

## 2019-04-07 DIAGNOSIS — Z79899 Other long term (current) drug therapy: Secondary | ICD-10-CM | POA: Diagnosis not present

## 2019-04-07 DIAGNOSIS — Z794 Long term (current) use of insulin: Secondary | ICD-10-CM | POA: Diagnosis not present

## 2019-04-07 DIAGNOSIS — F1210 Cannabis abuse, uncomplicated: Secondary | ICD-10-CM | POA: Diagnosis present

## 2019-04-07 DIAGNOSIS — Z20822 Contact with and (suspected) exposure to covid-19: Secondary | ICD-10-CM | POA: Diagnosis present

## 2019-04-07 DIAGNOSIS — Z791 Long term (current) use of non-steroidal anti-inflammatories (NSAID): Secondary | ICD-10-CM | POA: Diagnosis not present

## 2019-04-07 LAB — GLUCOSE, CAPILLARY
Glucose-Capillary: 223 mg/dL — ABNORMAL HIGH (ref 70–99)
Glucose-Capillary: 252 mg/dL — ABNORMAL HIGH (ref 70–99)
Glucose-Capillary: >600 mg/dL (ref 70–99)
Glucose-Capillary: >600 mg/dL (ref 70–99)
Glucose-Capillary: >600 mg/dL (ref 70–99)
Glucose-Capillary: 443 mg/dL — ABNORMAL HIGH (ref 70–99)
Glucose-Capillary: >600 mg/dL (ref 70–99)
Glucose-Capillary: 340 mg/dL — ABNORMAL HIGH (ref 70–99)
Glucose-Capillary: 461 mg/dL — ABNORMAL HIGH (ref 70–99)
Glucose-Capillary: 499 mg/dL — ABNORMAL HIGH (ref 70–99)
Glucose-Capillary: 123 mg/dL — ABNORMAL HIGH (ref 70–99)
Glucose-Capillary: 353 mg/dL — ABNORMAL HIGH (ref 70–99)
Glucose-Capillary: 127 mg/dL — ABNORMAL HIGH (ref 70–99)
Glucose-Capillary: 151 mg/dL — ABNORMAL HIGH (ref 70–99)
Glucose-Capillary: 56 mg/dL — ABNORMAL LOW (ref 70–99)
Glucose-Capillary: 160 mg/dL — ABNORMAL HIGH (ref 70–99)
Glucose-Capillary: 105 mg/dL — ABNORMAL HIGH (ref 70–99)
Glucose-Capillary: 156 mg/dL — ABNORMAL HIGH (ref 70–99)

## 2019-04-07 LAB — LIPID PANEL
Cholesterol: 193 mg/dL (ref 0–200)
HDL: 68 mg/dL (ref >40)
LDL Cholesterol: 115 mg/dL — ABNORMAL HIGH (ref 0–99)
Total CHOL/HDL Ratio: 2.8 RATIO
Triglycerides: 52 mg/dL (ref <150)
VLDL: 10 mg/dL (ref 0–40)

## 2019-04-07 LAB — COMPREHENSIVE METABOLIC PANEL
ALT: 14 U/L (ref 0–44)
AST: 15 U/L (ref 15–41)
Albumin: 3.3 g/dL — ABNORMAL LOW (ref 3.5–5.0)
Alkaline Phosphatase: 58 U/L (ref 38–126)
Anion gap: 11 (ref 5–15)
BUN: 18 mg/dL (ref 8–23)
CO2: 23 mmol/L (ref 22–32)
Calcium: 8.6 mg/dL — ABNORMAL LOW (ref 8.9–10.3)
Chloride: 104 mmol/L (ref 98–111)
Creatinine, Ser: 0.91 mg/dL (ref 0.61–1.24)
GFR calc Af Amer: >60 mL/min (ref >60)
GFR calc non Af Amer: >60 mL/min (ref >60)
Glucose, Bld: 304 mg/dL — ABNORMAL HIGH (ref 70–99)
Potassium: 4.4 mmol/L (ref 3.5–5.1)
Sodium: 138 mmol/L (ref 135–145)
Total Bilirubin: 0.9 mg/dL (ref 0.3–1.2)
Total Protein: 7 g/dL (ref 6.5–8.1)

## 2019-04-07 LAB — URINALYSIS, ROUTINE W REFLEX MICROSCOPIC
Bacteria, UA: NONE SEEN
Bilirubin Urine: NEGATIVE
Glucose, UA: >=500 mg/dL — AB
Hgb urine dipstick: NEGATIVE
Ketones, ur: 20 mg/dL — AB
Leukocytes,Ua: NEGATIVE
Nitrite: NEGATIVE
Protein, ur: 100 mg/dL — AB
Specific Gravity, Urine: 1.045 — ABNORMAL HIGH (ref 1.005–1.030)
pH: 5 (ref 5.0–8.0)

## 2019-04-07 LAB — GLUCOSE, RANDOM: Glucose, Bld: 733 mg/dL (ref 70–99)

## 2019-04-07 LAB — CBC WITH DIFFERENTIAL/PLATELET
Abs Immature Granulocytes: 0.02 10*3/uL (ref 0.00–0.07)
Basophils Absolute: 0 10*3/uL (ref 0.0–0.1)
Basophils Relative: 0 %
Eosinophils Absolute: 0 10*3/uL (ref 0.0–0.5)
Eosinophils Relative: 0 %
HCT: 39.8 % (ref 39.0–52.0)
Hemoglobin: 13 g/dL (ref 13.0–17.0)
Immature Granulocytes: 0 %
Lymphocytes Relative: 10 %
Lymphs Abs: 0.7 10*3/uL (ref 0.7–4.0)
MCH: 28.8 pg (ref 26.0–34.0)
MCHC: 32.7 g/dL (ref 30.0–36.0)
MCV: 88.1 fL (ref 80.0–100.0)
Monocytes Absolute: 0.1 10*3/uL (ref 0.1–1.0)
Monocytes Relative: 2 %
Neutro Abs: 5.9 10*3/uL (ref 1.7–7.7)
Neutrophils Relative %: 88 %
Platelets: 258 10*3/uL (ref 150–400)
RBC: 4.52 MIL/uL (ref 4.22–5.81)
RDW: 11.8 % (ref 11.5–15.5)
WBC: 6.8 10*3/uL (ref 4.0–10.5)
nRBC: 0 % (ref 0.0–0.2)

## 2019-04-07 LAB — MAGNESIUM
Magnesium: 1.9 mg/dL (ref 1.7–2.4)
Magnesium: 2.3 mg/dL (ref 1.7–2.4)

## 2019-04-07 LAB — PROCALCITONIN: Procalcitonin: <0.1 ng/mL

## 2019-04-07 LAB — RESPIRATORY PANEL BY RT PCR (FLU A&B, COVID)
Influenza A by PCR: NEGATIVE
Influenza B by PCR: NEGATIVE
SARS Coronavirus 2 by RT PCR: NEGATIVE

## 2019-04-07 LAB — BETA-HYDROXYBUTYRIC ACID: Beta-Hydroxybutyric Acid: 0.34 mmol/L — ABNORMAL HIGH (ref 0.05–0.27)

## 2019-04-07 LAB — RAPID URINE DRUG SCREEN, HOSP PERFORMED
Amphetamines: NOT DETECTED
Barbiturates: NOT DETECTED
Benzodiazepines: POSITIVE — AB
Cocaine: POSITIVE — AB
Opiates: NOT DETECTED
Tetrahydrocannabinol: NOT DETECTED

## 2019-04-07 LAB — TROPONIN I (HIGH SENSITIVITY)
Troponin I (High Sensitivity): 14 ng/L (ref <18)
Troponin I (High Sensitivity): 15 ng/L (ref <18)

## 2019-04-07 LAB — ACETAMINOPHEN LEVEL: Acetaminophen (Tylenol), Serum: <10 ug/mL — ABNORMAL LOW (ref 10–30)

## 2019-04-07 LAB — HEMOGLOBIN A1C
Hgb A1c MFr Bld: 14.3 % — ABNORMAL HIGH (ref 4.8–5.6)
Mean Plasma Glucose: 363.71 mg/dL

## 2019-04-07 LAB — PHOSPHORUS
Phosphorus: 3.7 mg/dL (ref 2.5–4.6)
Phosphorus: 4.9 mg/dL — ABNORMAL HIGH (ref 2.5–4.6)

## 2019-04-07 LAB — GROUP A STREP BY PCR: Group A Strep by PCR: NOT DETECTED

## 2019-04-07 LAB — HIV ANTIBODY (ROUTINE TESTING W REFLEX): HIV Screen 4th Generation wRfx: NONREACTIVE

## 2019-04-07 LAB — LACTIC ACID, PLASMA: Lactic Acid, Venous: 1.6 mmol/L (ref 0.5–1.9)

## 2019-04-07 MED ORDER — LORAZEPAM 1 MG PO TABS: 1.0000 mg | ORAL_TABLET | ORAL | Status: DC | PRN

## 2019-04-07 MED ORDER — SODIUM CHLORIDE 0.9 % IV SOLN: 1.0000 g | Freq: Once | INTRAVENOUS | Status: DC

## 2019-04-07 MED ORDER — ENOXAPARIN SODIUM 40 MG/0.4ML ~~LOC~~ SOLN
40.0000 mg | SUBCUTANEOUS | Status: DC
  Administered 2019-04-07 – 2019-04-08 (×2): 40 mg via SUBCUTANEOUS
  Filled 2019-04-07 (×2): qty 0.4

## 2019-04-07 MED ORDER — THIAMINE HCL 100 MG PO TABS
100.0000 mg | ORAL_TABLET | Freq: Every day | ORAL | Status: DC
  Administered 2019-04-07 – 2019-04-08 (×2): 100 mg via ORAL
  Filled 2019-04-07 (×2): qty 1

## 2019-04-07 MED ORDER — SODIUM CHLORIDE 0.9 % IV SOLN: INTRAVENOUS | Status: DC

## 2019-04-07 MED ORDER — SODIUM CHLORIDE 0.9 % IV SOLN
2.0000 g | INTRAVENOUS | Status: DC
  Administered 2019-04-07: 2 g via INTRAVENOUS
  Filled 2019-04-07 (×2): qty 20

## 2019-04-07 MED ORDER — LORAZEPAM 2 MG/ML IJ SOLN
1.0000 mg | INTRAMUSCULAR | Status: DC | PRN
  Administered 2019-04-07: 1 mg via INTRAVENOUS
  Administered 2019-04-07 (×2): 2 mg via INTRAVENOUS
  Filled 2019-04-07 (×3): qty 1

## 2019-04-07 MED ORDER — DEXTROSE 50 % IV SOLN
0.0000 mL | INTRAVENOUS | Status: DC | PRN
  Administered 2019-04-07: 25 mL via INTRAVENOUS
  Filled 2019-04-07: qty 50

## 2019-04-07 MED ORDER — CLINDAMYCIN PHOSPHATE 600 MG/50ML IV SOLN
600.0000 mg | Freq: Once | INTRAVENOUS | Status: AC
  Administered 2019-04-07: 600 mg via INTRAVENOUS
  Filled 2019-04-07: qty 50

## 2019-04-07 MED ORDER — INSULIN ASPART 100 UNIT/ML ~~LOC~~ SOLN: 0.0000 [IU] | Freq: Three times a day (TID) | SUBCUTANEOUS | Status: DC

## 2019-04-07 MED ORDER — LORAZEPAM 2 MG/ML IJ SOLN: 1.0000 mg | Freq: Four times a day (QID) | INTRAMUSCULAR | Status: DC | PRN

## 2019-04-07 MED ORDER — DEXAMETHASONE SODIUM PHOSPHATE 10 MG/ML IJ SOLN
10.0000 mg | Freq: Once | INTRAMUSCULAR | Status: AC
  Administered 2019-04-07: 10 mg via INTRAVENOUS
  Filled 2019-04-07: qty 1

## 2019-04-07 MED ORDER — DEXTROSE-NACL 5-0.45 % IV SOLN: INTRAVENOUS | Status: DC

## 2019-04-07 MED ORDER — FOLIC ACID 5 MG/ML IJ SOLN
1.0000 mg | Freq: Every day | INTRAMUSCULAR | Status: DC
  Administered 2019-04-07: 1 mg via INTRAVENOUS
  Filled 2019-04-07 (×2): qty 0.2

## 2019-04-07 MED ORDER — LORAZEPAM 2 MG/ML IJ SOLN
2.0000 mg | Freq: Three times a day (TID) | INTRAMUSCULAR | Status: DC
  Administered 2019-04-08: 2 mg via INTRAVENOUS
  Filled 2019-04-07: qty 1

## 2019-04-07 MED ORDER — CLINDAMYCIN PHOSPHATE 600 MG/50ML IV SOLN
600.0000 mg | Freq: Three times a day (TID) | INTRAVENOUS | Status: DC
  Administered 2019-04-07: 600 mg via INTRAVENOUS
  Filled 2019-04-07 (×3): qty 50

## 2019-04-07 MED ORDER — DEXAMETHASONE SODIUM PHOSPHATE 10 MG/ML IJ SOLN
10.0000 mg | Freq: Every day | INTRAMUSCULAR | Status: DC
  Filled 2019-04-07: qty 1

## 2019-04-07 MED ORDER — THIAMINE HCL 100 MG/ML IJ SOLN: 100.0000 mg | Freq: Every day | INTRAMUSCULAR | Status: DC

## 2019-04-07 MED ORDER — INSULIN REGULAR(HUMAN) IN NACL 100-0.9 UT/100ML-% IV SOLN
INTRAVENOUS | Status: DC
  Administered 2019-04-07: 0.8 [IU]/h via INTRAVENOUS
  Administered 2019-04-07: 14 [IU]/h via INTRAVENOUS
  Administered 2019-04-07: 0.5 [IU]/h via INTRAVENOUS
  Filled 2019-04-07 (×2): qty 100

## 2019-04-07 NOTE — Progress Notes (Signed)
CRITICAL VALUE ALERT  Critical Value:  Glucose 733  Date & Time Notied:  1:03 PM MD aware  Provider Notified: Joseph Art  Orders Received/Actions taken: Orders to transfer

## 2019-04-07 NOTE — Progress Notes (Signed)
CBG meter reading HIGH, >600. Dr. Joseph Art notified. New orders received.

## 2019-04-07 NOTE — Progress Notes (Signed)
Pt refusing insulin per orders.

## 2019-04-07 NOTE — Progress Notes (Signed)
Patient given 2mg  IV ativan for CIWA of 14, calm and cooperative. Restraints not needed, never applied.

## 2019-04-07 NOTE — H&P (Signed)
[facility] History and Physical  William Acevedo TKZ:[id number] DOB: 06/26/1955 DOA: 04/06/2019  Referring ED physician: Dr. Manus Gunning PCP: Priscille Kluver, MD   Chief Complaint: Altered Mental Status  HPI: William Acevedo is a 64 y.o. male with PMH of DM, alcohol and polysubstance abuse who was brought in by EMS at the request of his brother for assistance with substance abuse. Patient with AMS on arrival and subsequently found to have epiglottitis. ENT consulted and recommended admission.   History limited as patient acutely altered and groggy during evaluation. History following mostly obtained from patient's brother. Patient only reports during evaluation that his throat has been bothering him for the last two weeks, unable to provide further history. Patient's brother William Acevedo was contacted by phone and reports that William Acevedo has a 40 year history of alcohol and drug abuse. He lives with him. William Acevedo reported that William Acevedo has not eaten for the last 3-4 days and he has been using crack cocaine. He reports that his brother expressed desire to "get help" and had AMS and therefore he called EMS to bring him to the hospital.   In the ED: BP initially very elevated and then normal range over time. Tachycardic on presentation with temp to 99.77F. Mild tachypnea on presentation but maintaining O2 sats at 99-100%. Labs remarkable for Na 134, Glucose 342, LFT's WNL. Ethanol level < 10, CBC WNL. Tylenol and Salicylate levels WNL. Trop, Group A Strep, Lactate WNL. UDS pending. Blood cx ordered. CT Head: No acute abnormalities.  Patient with stridor and CT Neck done: IMPRESSION: 1. Findings suggestive of acute epiglottitis/supraglottitis as above. Airway remains patent at this time. 2. No other acute abnormality within the neck. 3. Moderate cervical spondylosis at C5-6 and C6-7. 4. Aortic Atherosclerosis (ICD10-I70.0). ENT was consulted by ED who recommended against intubation at this time and  recommended admission.  Patient was given Decadron, Clindamycin, Ativan and 1L NS.  Review of Systems:  Unable to obtain complete ROS due to patient factors: altered mental status.   Past Medical History:  Diagnosis Date  . Carpal tunnel syndrome   . Depression   . Diabetes mellitus without complication ([facility])   . Neuropathy    Past Surgical History:  Procedure Laterality Date  . TESTICLE TORSION REDUCTION     Social History:  reports that he has been smoking cigarettes. He has been smoking about 0.50 packs per day. He has never used smokeless tobacco. He reports current alcohol use. He reports current drug use. Drug: Cocaine.  No Known Allergies  Patient unable to provide any history on family at this time.   Prior to Admission medications   Medication Sig Start Date End Date Taking? Authorizing Provider  cyclobenzaprine (FLEXERIL) 10 MG tablet Take 1 tablet (10 mg total) by mouth 2 (two) times daily as needed for muscle spasms. 01/18/19   Tegeler, Canary Brim, MD  gabapentin (NEURONTIN) 100 MG capsule Take 1 capsule (100 mg total) by mouth 3 (three) times daily. Patient taking differently: Take 600 mg by mouth at bedtime.  12/02/13   Muthersbaugh, Dahlia Client, PA-C  ibuprofen (ADVIL) 800 MG tablet Take 1 tablet (800 mg total) by mouth every 8 (eight) hours as needed. 09/21/18   Lawyer, Cristal Deer, PA-C  insulin glargine (LANTUS) 100 UNIT/ML injection Inject 40 Units into the skin daily. Reported on 07/05/2015    [provider]  ketoconazole (NIZORAL) 2 % cream Apply 1 fingertip amount to each foot daily. Patient not taking: Reported on 09/21/2018 08/10/17  Park Liter, DPM  metFORMIN (GLUCOPHAGE) 500 MG tablet Take 500 mg by mouth 2 (two) times daily with a meal.    [provider]  traMADol (ULTRAM) 50 MG tablet Take 1 tablet (50 mg total) by mouth every 6 (six) hours as needed for severe pain. 09/21/18   Charlestine Night, PA-C   Physical Exam: Vitals:    04/06/19 2130 04/06/19 2230 04/06/19 2300 04/06/19 2315  BP: (!) 198/102 (!) 162/81 135/84 117/73  Pulse: (!) 109 (!) 104 99 92  Resp: (!) 26 (!) 25  20  Temp:      TempSrc:      SpO2: 99% 100% 99% 98%  Weight:      Height:        Wt Readings from Last 3 Encounters:  04/06/19 54.4 kg  09/21/18 59 kg  04/13/16 56.7 kg    General:  Sleeping soundly. Stridor noted with breathing. Awakens to stimulation but unable to hold conversation for long. Unable to assess orientation. Appears cachectic.  Eyes: EOMI, normal lids, irises & conjunctiva ENT: grossly normal hearing Cardiovascular: RRR, no m/r/g. No LE edema. Respiratory: Lungs clear although upper airway stridor heard. Abdomen: soft, ntnd Skin: no rash or induration seen on limited exam Musculoskeletal: grossly normal tone BUE/BLE Psychiatric: grossly normal mood and affect, speech fluent and appropriate Neurologic: grossly non-focal.          Labs on Admission:  Basic Metabolic Panel: Recent Labs  Lab 04/06/19 2150  NA 134*  K 4.5  CL 96*  CO2 24  GLUCOSE 342*  BUN 16  CREATININE 0.98  CALCIUM 9.6   Liver Function Tests: Recent Labs  Lab 04/06/19 2150  AST 20  ALT 15  ALKPHOS 75  BILITOT 0.7  PROT 8.4*  ALBUMIN 4.3   No results for input(s): LIPASE, AMYLASE in the last 168 hours. No results for input(s): AMMONIA in the last 168 hours. CBC: Recent Labs  Lab 04/06/19 2150  WBC 10.4  NEUTROABS 8.0*  HGB 15.3  HCT 46.5  MCV 88.9  PLT 297   Cardiac Enzymes: No results for input(s): CKTOTAL, CKMB, CKMBINDEX, TROPONINI in the last 168 hours.  BNP (last 3 results) No results for input(s): BNP in the last 8760 hours.  ProBNP (last 3 results) No results for input(s): PROBNP in the last 8760 hours.  CBG: Recent Labs  Lab 04/06/19 2155  GLUCAP 362*    Radiological Exams on Admission: CT Head Wo Contrast  Result Date: 04/07/2019 CLINICAL DATA:  Altered mental status EXAM: CT HEAD WITHOUT  CONTRAST TECHNIQUE: Contiguous axial images were obtained from the base of the skull through the vertex without intravenous contrast. COMPARISON:  01/18/2019 FINDINGS: Brain: No acute intracranial abnormality. Specifically, no hemorrhage, hydrocephalus, mass lesion, acute infarction, or significant intracranial injury. Mild chronic small vessel disease within the deep white matter. Vascular: No hyperdense vessel or unexpected calcification. Skull: No acute calvarial abnormality. Sinuses/Orbits: Visualized paranasal sinuses and mastoids clear. Orbital soft tissues unremarkable. Other: None IMPRESSION: No acute intracranial abnormality. Mild chronic small vessel disease. Electronically Signed   By: Charlett Nose M.D.   On: 04/07/2019 00:02   CT Soft Tissue Neck W Contrast  Result Date: 04/07/2019 CLINICAL DATA:  Initial evaluation for acute dysphagia. EXAM: CT NECK WITH CONTRAST TECHNIQUE: Multidetector CT imaging of the neck was performed using the standard protocol following the bolus administration of intravenous contrast. CONTRAST:  43mL OMNIPAQUE IOHEXOL 300 MG/ML  SOLN COMPARISON:  None. FINDINGS: Pharynx and larynx: Examination  technically limited by motion artifact. Evaluation of the oral cavity limited by streak artifact from dental amalgam. No obvious mass lesion or loculated collection. No appreciable base of tongue lesion. No findings to suggest acute tonsillitis. Parapharyngeal fat maintained. Nasopharynx within normal limits. No retropharyngeal collection. The epiglottis is mildly thickened and edematous in appearance as are the aryepiglottic folds (series 7, image 84). Findings suggestive of acute epiglottitis/supraglottitis. Mild mucosal edema and swelling seen along the left pharynx, consistent with associated pharyngitis. Left piriform sinus is effaced. No discrete fluid collections. Supraglottic airway remains patent at this time. Glottis itself relatively with within normal limits. Subglottic  airway clear. Salivary glands: Salivary glands including the parotid and submandibular glands within normal limits. Thyroid: Thyroid within normal limits. Lymph nodes: No pathologically enlarged or discrete adenopathy seen within the neck. Vascular: Moderate atherosclerotic change about the aortic arch and origin of great vessels as well as about the carotid bifurcations. Scattered vascular calcifications noted within the carotid siphons. Limited intracranial: Unremarkable. Visualized orbits: Unremarkable. Mastoids and visualized paranasal sinuses: Paranasal sinuses are clear. Mastoid air cells and middle ear cavities are well pneumatized and free of fluid. Skeleton: Moderate cervical spondylosis noted at C5-6 and C6-7. No discrete or worrisome osseous lesions. Upper chest: Visualized upper chest demonstrates no acute finding. Mild paraseptal emphysema noted at the lung apices. Other: None. IMPRESSION: 1. Findings suggestive of acute epiglottitis/supraglottitis as above. Airway remains patent at this time. 2. No other acute abnormality within the neck. 3. Moderate cervical spondylosis at C5-6 and C6-7. 4. Aortic Atherosclerosis (ICD10-I70.0). Electronically Signed   By: Rise Mu M.D.   On: 04/07/2019 00:29    EKG: Independently reviewed. HR 110. Sinus tachycardia. QTc 495. No STEMI.  Assessment/Plan Principal Problem:   Epiglottitis Active Problems:   MDD (major depressive disorder), recurrent episode, mild ([facility])   Uncomplicated alcohol dependence ([facility])   Overdose   Polysubstance abuse ([facility])   DM2 (diabetes mellitus, type 2) ([facility])   Cocaine use   Acute encephalopathy  64 y.o. male with PMH of DM, alcohol and polysubstance abuse who was brought in by EMS at the request of his brother for assistance with substance abuse. Patient with AMS on arrival and subsequently found to have epiglottitis. ENT consulted and recommended admission.   Epiglottitis - Patient with clinical stridor but  maintaining O2 sats - reports sore throat x2 weeks - WBC and lactate WNL - Temp 99.75F on admission - ENT consulted by ED: recommended admission and will follow - Given Clindamycin in ED along with Decadron - Will cont Clindamycin 900 mg q6h and Decadron 10 mg IV - Add Ceftriaxone 2 g q24h  Acute Encephalopathy presumably due to drug abuse (cocaine) History of Alcohol abuse Poor PO intake  - per brother, history of drug abuse x40 years; reports patient has been using crack cocaine and expressed desire to "get help" - UDS pending - Ethanol level < 10 - CIWA protocol with thiamine and add folate when patient able to tolerate PO - SW consult  - Ativan PRN - Mag and Phos levels - Patient appears cachetic and brother reports he has not eaten for 3-4 days; consider dietician consult as needed   DM2 - A1c ordered on admission - elevated glucose on admission without anion gap or electrolyte abnormalities - sliding scale with insulin - PTA meds held as patient NPO due to AMS    Code Status: Full Code; discussed with patient's brother William Acevedo DVT Prophylaxis: Lovenox Family Communication: Spoke with patient's brother  [name] by phone Disposition Plan: Admit to inpatient. Patient acutely altered and currently unable to tolerate PO and requiring IV abx and further monitoring of respiratory status. He is at high risk for necessitating intubation. Anticipate discharge in 2-3 days; destination pending drug rehab needs.   Time spent: 60 minutes  Chauncey Mann, MD [facility] Pager [phone]

## 2019-04-07 NOTE — Progress Notes (Signed)
Report given to [facility], [place]. Transferred to 6E26 in stable condition.

## 2019-04-07 NOTE — Progress Notes (Signed)
Patient found trying to remove his IV and stating he was going home because he needed to find his car keys.  States he wants to leave but will come back.  Advised that this would be AMA.  Patient continues to state he will return. Patient unable to state the date and disoriented to situation.  Dr. Joseph Art notified.  Due to lack of orientation patient is not safe to leave.  Social Worker notified. Patient agreed to stay for now.

## 2019-04-07 NOTE — Progress Notes (Signed)
PROGRESS NOTE    William Acevedo  BOF:[id number] DOB: 1955/12/21 DOA: 04/06/2019 PCP: William Messier, MD     Brief Narrative:  64 y.o. BM PMHx DM type II uncontrolled with complication, EtOH abuse, Polysubstance abuse,   Brought in by EMS at the request of his brother for assistance with substance abuse. Patient with AMS on arrival and subsequently found to have epiglottitis. ENT consulted and recommended admission.   History limited as patient acutely altered and groggy during evaluation. History following mostly obtained from patient's brother. Patient only reports during evaluation that his throat has been bothering him for the last two weeks, unable to provide further history. Patient's brother William Acevedo was contacted by phone and reports that William Acevedo has a 40 year history of alcohol and drug abuse. He lives with him. William Acevedo reported that [name] has not eaten for the last 3-4 days and he has been using crack cocaine. He reports that his brother expressed desire to "get help" and had AMS and therefore he called EMS to bring him to the hospital.   In the ED: BP initially very elevated and then normal range over time. Tachycardic on presentation with temp to 99.41F. Mild tachypnea on presentation but maintaining O2 sats at 99-100%. Labs remarkable for Na 134, Glucose 342, LFT's WNL. Ethanol level < 10, CBC WNL. Tylenol and Salicylate levels WNL. Trop, Group A Strep, Lactate WNL. UDS pending. Blood cx ordered. CT Head: No acute abnormalities.  Patient with stridor and CT Neck done: IMPRESSION: 1. Findings suggestive of acute epiglottitis/supraglottitis as above. Airway remains patent at this time. 2. No other acute abnormality within the neck. 3. Moderate cervical spondylosis at C5-6 and C6-7. 4. Aortic Atherosclerosis (ICD10-I70.0). ENT was consulted by ED who recommended against intubation at this time and recommended admission.  Patient was given Decadron, Clindamycin, Ativan and 1L  NS.   Subjective: A/O x4, states he did not willingly come to the hospital.  Has been using cocaine and other drugs for 40 years.  Somebody called an ambulance on him and the only reason he entered the ambulance and allow them to bring him to the hospital because the police were on the scene.  Patient does not want to be hospitalized.   Assessment & Plan:   Principal Problem:   Epiglottitis Active Problems:   MDD (major depressive disorder), recurrent episode, mild (HCC)   Uncomplicated alcohol dependence (HCC)   Overdose   Polysubstance abuse ([place])   DM2 (diabetes mellitus, type 2) (HCC)   Cocaine use   Acute encephalopathy   Epiglottitis - Patient with clinical stridor but maintaining O2 sats - reports sore throat x2 weeks - WBC and lactate WNL - Temp 99.34F on admission - ENT consulted by ED: recommended admission and will follow - Given Clindamycin in ED along with Decadron - Will cont Clindamycin 900 mg q6h and Decadron 10 mg IV - Add Ceftriaxone 2 g q24h -Procalcitonin negative, negative fever, negative leukocytosis.  Concur with ENT to discontinue antibiotics. -Continue Decadron 10 mg daily  Acute Encephalopathy   EtOH abuse -EtOH level<10 on admission -Ativan IV 2 mg TID -CIWA protocol  Polysubstance abuse -3/21 urine tox screen positive Benzodiazepine, cocaine -Per patient has been using cocaine as well as other illicit drugs for 40 years HAS NO DESIRE FOR HELP  Borderline QT interval prolongation -3/21 EKG pending  Poor PO intake    DM type II uncontrolled with complication/HHNS -7/78 start patient on Endo tool -3/21 hemoglobin A1c= 14.3 -Lipid panel  pending  Not competent to make medical decisions -Spoke with RN William Acevedo patient becoming combative not oriented to time, elevated CBG 499.  Appears patient combination of HHNS and cocaine withdrawal.  Not competent to make medical decisions -IVC patient.      DVT prophylaxis: Lovenox Code Status:  Full Family Communication:  Disposition Plan:  1.  Where the patient is from 2.  Anticipated d/c place. 3.  Barriers to d/c OR conditions which need to be met to effect a safe d/c. If patient is medically stable, please make sure this is clear: Patient is medically stable for discharge."    Consultants:  ENT   Procedures/Significant Events:    I have personally reviewed and interpreted all radiology studies and my findings are as above.  VENTILATOR SETTINGS:    Cultures   Antimicrobials: Anti-infectives (From admission, onward)   Start     Dose/Rate Stop   04/07/19 1000  clindamycin (CLEOCIN) IVPB 600 mg     600 mg 100 mL/hr over 30 Minutes     04/07/19 0415  cefTRIAXone (ROCEPHIN) 2 g in sodium chloride 0.9 % 100 mL IVPB     2 g 200 mL/hr over 30 Minutes     04/07/19 0100  clindamycin (CLEOCIN) IVPB 600 mg     600 mg 100 mL/hr over 30 Minutes 04/07/19 0210   04/07/19 0045  cefTRIAXone (ROCEPHIN) 1 g in sodium chloride 0.9 % 100 mL IVPB  Status:  Discontinued     1 g 200 mL/hr over 30 Minutes 04/07/19 0048       Devices    LINES / TUBES:      Continuous Infusions: . cefTRIAXone (ROCEPHIN)  IV 2 g (04/07/19 0436)  . clindamycin (CLEOCIN) IV       Objective: Vitals:   04/07/19 0315 04/07/19 0345 04/07/19 0400 04/07/19 0523  BP: 120/71 107/66 116/70 135/68  Pulse: 86 85 85 83  Resp: 19 19 18 18   Temp:    97.8 F (36.6 C)  TempSrc:    Oral  SpO2: 99% 97% 98% 99%  Weight:      Height:        Intake/Output Summary (Last 24 hours) at 04/07/2019 1194 Last data filed at 04/07/2019 1740 Gross per 24 hour  Intake 50 ml  Output 350 ml  Net -300 ml   Filed Weights   04/06/19 2024  Weight: 54.4 kg    Examination:  General: A/O x4, belligerent, states only reason he is here is because of the police, no acute respiratory distress, cachectic Eyes: negative scleral hemorrhage, negative anisocoria, negative icterus ENT: Negative Runny nose, negative  gingival bleeding, Neck:  Negative scars, masses, torticollis, lymphadenopathy, JVD Lungs: Clear to auscultation bilaterally without wheezes or crackles Cardiovascular: Regular rate and rhythm without murmur gallop or rub normal S1 and S2 Abdomen: negative abdominal pain, nondistended, positive soft, bowel sounds, no rebound, no ascites, no appreciable mass Extremities: No significant cyanosis, clubbing, or edema bilateral lower extremities Skin: Negative rashes, lesions, ulcers Psychiatric:  Negative depression, negative anxiety, negative fatigue, negative mania  Central nervous system:  Cranial nerves II through XII intact, tongue/uvula midline, all extremities muscle strength 5/5, sensation intact throughout, negative dysarthria, negative expressive aphasia, negative receptive aphasia.  .     Data Reviewed: Care during the described time interval was provided by me .  I have reviewed this patient's available data, including medical history, events of note, physical examination, and all test results as part of my evaluation.  CBC: Recent Labs  Lab 04/06/19 2150  WBC 10.4  NEUTROABS 8.0*  HGB 15.3  HCT 46.5  MCV 88.9  PLT 591   Basic Metabolic Panel: Recent Labs  Lab 04/06/19 2150 04/07/19 0235  NA 134*  --   K 4.5  --   CL 96*  --   CO2 24  --   GLUCOSE 342*  --   BUN 16  --   CREATININE 0.98  --   CALCIUM 9.6  --   MG  --  1.9  PHOS  --  3.7   GFR: Estimated Creatinine Clearance: 57.8 mL/min (by C-G formula based on SCr of 0.98 mg/dL). Liver Function Tests: Recent Labs  Lab 04/06/19 2150  AST 20  ALT 15  ALKPHOS 75  BILITOT 0.7  PROT 8.4*  ALBUMIN 4.3   No results for input(s): LIPASE, AMYLASE in the last 168 hours. No results for input(s): AMMONIA in the last 168 hours. Coagulation Profile: No results for input(s): INR, PROTIME in the last 168 hours. Cardiac Enzymes: No results for input(s): CKTOTAL, CKMB, CKMBINDEX, TROPONINI in the last 168  hours. BNP (last 3 results) No results for input(s): PROBNP in the last 8760 hours. HbA1C: Recent Labs    04/07/19 0402  HGBA1C 14.3*   CBG: Recent Labs  Lab 04/06/19 2155 04/07/19 0528 04/07/19 0814  GLUCAP 362* 223* 252*   Lipid Profile: No results for input(s): CHOL, HDL, LDLCALC, TRIG, CHOLHDL, LDLDIRECT in the last 72 hours. Thyroid Function Tests: No results for input(s): TSH, T4TOTAL, FREET4, T3FREE, THYROIDAB in the last 72 hours. Anemia Panel: No results for input(s): VITAMINB12, FOLATE, FERRITIN, TIBC, IRON, RETICCTPCT in the last 72 hours. Sepsis Labs: Recent Labs  Lab 04/07/19 0112  LATICACIDVEN 1.6    Recent Results (from the past 240 hour(s))  Group A Strep by PCR     Status: None   Collection Time: 04/07/19 12:39 AM   Specimen: Throat; Sterile Swab  Result Value Ref Range Status   Group A Strep by PCR NOT DETECTED NOT DETECTED Final    Comment: Performed at [facility], [address], [place], [place] [address]  Respiratory Panel by RT PCR (Flu A&B, Covid) - Nasopharyngeal Swab     Status: None   Collection Time: 04/07/19 12:53 AM   Specimen: Nasopharyngeal Swab  Result Value Ref Range Status   SARS Coronavirus 2 by RT PCR NEGATIVE NEGATIVE Final    Comment: (NOTE) SARS-CoV-2 target nucleic acids are NOT DETECTED. The SARS-CoV-2 RNA is generally detectable in upper respiratoy specimens during the acute phase of infection. The lowest concentration of SARS-CoV-2 viral copies this assay can detect is 131 copies/mL. A negative result does not preclude SARS-Cov-2 infection and should not be used as the sole basis for treatment or other patient management decisions. A negative result may occur with  improper specimen collection/handling, submission of specimen other than nasopharyngeal swab, presence of viral mutation(s) within the areas targeted by this assay, and inadequate number of viral copies (<131 copies/mL). A negative result must be  combined with clinical observations, patient history, and epidemiological information. The expected result is Negative. Fact Sheet for Patients:  PinkCheek.be Fact Sheet for Healthcare Providers:  GravelBags.it This test is not yet ap proved or cleared by the [place] FDA and  has been authorized for detection and/or diagnosis of SARS-CoV-2 by FDA under an Emergency Use Authorization (EUA). This EUA will remain  in effect (meaning this test can be used) for  the duration of the COVID-19 declaration under Section 564(b)(1) of the Act, 21 U.S.C. section 360bbb-3(b)(1), unless the authorization is terminated or revoked sooner.    Influenza A by PCR NEGATIVE NEGATIVE Final   Influenza B by PCR NEGATIVE NEGATIVE Final    Comment: (NOTE) The Xpert Xpress SARS-CoV-2/FLU/RSV assay is intended as an aid in  the diagnosis of influenza from Nasopharyngeal swab specimens and  should not be used as a sole basis for treatment. Nasal washings and  aspirates are unacceptable for Xpert Xpress SARS-CoV-2/FLU/RSV  testing. Fact Sheet for Patients: PinkCheek.be Fact Sheet for Healthcare Providers: GravelBags.it This test is not yet approved or cleared by the [place] FDA and  has been authorized for detection and/or diagnosis of SARS-CoV-2 by  FDA under an Emergency Use Authorization (EUA). This EUA will remain  in effect (meaning this test can be used) for the duration of the  Covid-19 declaration under Section 564(b)(1) of the Act, 21  U.S.C. section 360bbb-3(b)(1), unless the authorization is  terminated or revoked. Performed at [facility], [place] [address], [place],  [address]          Radiology Studies: CT Head Wo Contrast  Result Date: 04/07/2019 CLINICAL DATA:  Altered mental status EXAM: CT HEAD WITHOUT CONTRAST TECHNIQUE: Contiguous axial images  were obtained from the base of the skull through the vertex without intravenous contrast. COMPARISON:  01/18/2019 FINDINGS: Brain: No acute intracranial abnormality. Specifically, no hemorrhage, hydrocephalus, mass lesion, acute infarction, or significant intracranial injury. Mild chronic small vessel disease within the deep white matter. Vascular: No hyperdense vessel or unexpected calcification. Skull: No acute calvarial abnormality. Sinuses/Orbits: Visualized paranasal sinuses and mastoids clear. Orbital soft tissues unremarkable. Other: None IMPRESSION: No acute intracranial abnormality. Mild chronic small vessel disease. Electronically Signed   By: Rolm Baptise M.D.   On: 04/07/2019 00:02   CT Soft Tissue Neck W Contrast  Result Date: 04/07/2019 CLINICAL DATA:  Initial evaluation for acute dysphagia. EXAM: CT NECK WITH CONTRAST TECHNIQUE: Multidetector CT imaging of the neck was performed using the standard protocol following the bolus administration of intravenous contrast. CONTRAST:  70m OMNIPAQUE IOHEXOL 300 MG/ML  SOLN COMPARISON:  None. FINDINGS: Pharynx and larynx: Examination technically limited by motion artifact. Evaluation of the oral cavity limited by streak artifact from dental amalgam. No obvious mass lesion or loculated collection. No appreciable base of tongue lesion. No findings to suggest acute tonsillitis. Parapharyngeal fat maintained. Nasopharynx within normal limits. No retropharyngeal collection. The epiglottis is mildly thickened and edematous in appearance as are the aryepiglottic folds (series 7, image 84). Findings suggestive of acute epiglottitis/supraglottitis. Mild mucosal edema and swelling seen along the left pharynx, consistent with associated pharyngitis. Left piriform sinus is effaced. No discrete fluid collections. Supraglottic airway remains patent at this time. Glottis itself relatively with within normal limits. Subglottic airway clear. Salivary glands: Salivary glands  including the parotid and submandibular glands within normal limits. Thyroid: Thyroid within normal limits. Lymph nodes: No pathologically enlarged or discrete adenopathy seen within the neck. Vascular: Moderate atherosclerotic change about the aortic arch and origin of great vessels as well as about the carotid bifurcations. Scattered vascular calcifications noted within the carotid siphons. Limited intracranial: Unremarkable. Visualized orbits: Unremarkable. Mastoids and visualized paranasal sinuses: Paranasal sinuses are clear. Mastoid air cells and middle ear cavities are well pneumatized and free of fluid. Skeleton: Moderate cervical spondylosis noted at C5-6 and C6-7. No discrete or worrisome osseous lesions. Upper chest: Visualized upper chest demonstrates no  acute finding. Mild paraseptal emphysema noted at the lung apices. Other: None. IMPRESSION: 1. Findings suggestive of acute epiglottitis/supraglottitis as above. Airway remains patent at this time. 2. No other acute abnormality within the neck. 3. Moderate cervical spondylosis at C5-6 and C6-7. 4. Aortic Atherosclerosis (ICD10-I70.0). Electronically Signed   By: Jeannine Boga M.D.   On: 04/07/2019 00:29        Scheduled Meds: . [START ON 04/08/2019] dexamethasone (DECADRON) injection  10 mg Intravenous Daily  . enoxaparin (LOVENOX) injection  40 mg Subcutaneous Q24H  . insulin aspart  0-9 Units Subcutaneous TID WC  . thiamine  100 mg Oral Daily   Or  . thiamine  100 mg Intravenous Daily   Continuous Infusions: . cefTRIAXone (ROCEPHIN)  IV 2 g (04/07/19 0436)  . clindamycin (CLEOCIN) IV       LOS: 0 days    Time spent:40 min    Rayshard Schirtzinger, Geraldo Docker, MD [facility] Pager [phone]  If 7PM-7AM, please contact night-coverage [email or web address] Password [facility] 04/07/2019, 8:38 AM

## 2019-04-07 NOTE — ED Notes (Signed)
Report attempted 

## 2019-04-07 NOTE — Progress Notes (Signed)
Patient transferred to unit at 1320hrs.  Blood glucose per fingerstick HI, patient started on insulin gtt per endo tool at 14 units/hour.  Patient oriented to unit and plan of care for shift. Dr. Joseph Art here to see patient.

## 2019-04-07 NOTE — ED Provider Notes (Signed)
Care assumed from Dr. Criss Alvine.  Patient with altered mental status likely secondary to drug use.  History of diabetes.  Admits to using alcohol, cocaine and marijuana.  He received Ativan prior to my evaluation.  He is awaiting sobriety.  Labs show undetectable alcohol level.  Hyperglycemia without DKA.  CT head is negative.  Patient was complaining of a sore throat and difficulty swallowing.  He is able to swallow his secretions.  CT scan was obtained and shows evidence of epiglottitis without airway involvement.  Discussed with Dr. Suszanne Conners of ENT who advises against intubation prophylactically and recommends steroids as well as antibiotics and observation admission.  Patient continues to maintain airway.  Oxygenation 100% on room air.  He started on IV steroids as well as IV clindamycin.  Will hold intubation at this time.  Admission for observation discussed with Dr. Morene Antu.  CRITICAL CARE Performed by: Glynn Octave Total critical care time: 45 minutes Critical care time was exclusive of separately billable procedures and treating other patients. Critical care was necessary to treat or prevent imminent or life-threatening deterioration. Critical care was time spent personally by me on the following activities: development of treatment plan with patient and/or surrogate as well as nursing, discussions with consultants, evaluation of patient's response to treatment, examination of patient, obtaining history from patient or surrogate, ordering and performing treatments and interventions, ordering and review of laboratory studies, ordering and review of radiographic studies, pulse oximetry and re-evaluation of patient's condition.    Glynn Octave, MD 04/07/19 [phone]

## 2019-04-07 NOTE — Consult Note (Addendum)
Reason for Consult: Possible epiglottitis Referring Physician: Glynn Octave, MD  HPI:  William Acevedo is a 64 y.o. male who was admitted this morning for his altered mental status due to drug abuse. The patient has not been eating for the last few days while using cocaine. His CT scan shows mildly thickened epiglottis without airway obstruction. He is admitted for airway observation. Pt reports that he has been experiencing intermittent lip and facial swelling. He was on lisinopril. Currently he denies any breathing difficulty or neck pain.  Past Medical History:  Diagnosis Date  . Carpal tunnel syndrome   . Depression   . Diabetes mellitus without complication (HCC)   . Neuropathy     Past Surgical History:  Procedure Laterality Date  . TESTICLE TORSION REDUCTION      History reviewed. No pertinent family history.  Social History:  reports that he has been smoking cigarettes. He has been smoking about 0.50 packs per day. He has never used smokeless tobacco. He reports current alcohol use. He reports current drug use. Drug: Cocaine.  Allergies:  Allergies  Allergen Reactions  . Lisinopril Swelling    angioedema    Prior to Admission medications   Medication Sig Start Date End Date Taking? Authorizing Provider  cyclobenzaprine (FLEXERIL) 10 MG tablet Take 1 tablet (10 mg total) by mouth 2 (two) times daily as needed for muscle spasms. 01/18/19   Tegeler, Canary Brim, MD  gabapentin (NEURONTIN) 100 MG capsule Take 1 capsule (100 mg total) by mouth 3 (three) times daily. Patient taking differently: Take 600 mg by mouth at bedtime.  12/02/13   Muthersbaugh, Dahlia Client, PA-C  ibuprofen (ADVIL) 800 MG tablet Take 1 tablet (800 mg total) by mouth every 8 (eight) hours as needed. 09/21/18   Lawyer, Cristal Deer, PA-C  insulin glargine (LANTUS) 100 UNIT/ML injection Inject 40 Units into the skin daily. Reported on 07/05/2015    [provider]  ketoconazole (NIZORAL) 2 % cream  Apply 1 fingertip amount to each foot daily. Patient not taking: Reported on 09/21/2018 08/10/17   Park Liter, DPM  metFORMIN (GLUCOPHAGE) 500 MG tablet Take 500 mg by mouth 2 (two) times daily with a meal.    [provider]  traMADol (ULTRAM) 50 MG tablet Take 1 tablet (50 mg total) by mouth every 6 (six) hours as needed for severe pain. 09/21/18   Lawyer, Cristal Deer, PA-C    Medications:  I have reviewed the patient's current medications. Scheduled: . [START ON 04/08/2019] dexamethasone (DECADRON) injection  10 mg Intravenous Daily  . enoxaparin (LOVENOX) injection  40 mg Subcutaneous Q24H  . folic acid  1 mg Intravenous Daily  . insulin aspart  0-9 Units Subcutaneous TID WC  . thiamine  100 mg Oral Daily   Or  . thiamine  100 mg Intravenous Daily   Continuous: . cefTRIAXone (ROCEPHIN)  IV 2 g (04/07/19 0436)  . clindamycin (CLEOCIN) IV     IDP:OEUMPNTIR, LORazepam **OR** LORazepam  Results for orders placed or performed during the hospital encounter of 04/06/19 (from the past 48 hour(s))  Comprehensive metabolic panel     Status: Abnormal   Collection Time: 04/06/19  9:50 PM  Result Value Ref Range   Sodium 134 (L) 135 - 145 mmol/L   Potassium 4.5 3.5 - 5.1 mmol/L   Chloride 96 (L) 98 - 111 mmol/L   CO2 24 22 - 32 mmol/L   Glucose, Bld 342 (H) 70 - 99 mg/dL    Comment: Glucose reference  range applies only to samples taken after fasting for at least 8 hours.   BUN 16 8 - 23 mg/dL   Creatinine, Ser 4.01 0.61 - 1.24 mg/dL   Calcium 9.6 8.9 - 02.7 mg/dL   Total Protein 8.4 (H) 6.5 - 8.1 g/dL   Albumin 4.3 3.5 - 5.0 g/dL   AST 20 15 - 41 U/L   ALT 15 0 - 44 U/L   Alkaline Phosphatase 75 38 - 126 U/L   Total Bilirubin 0.7 0.3 - 1.2 mg/dL   GFR calc non Af Amer >60 >60 mL/min   GFR calc Af Amer >60 >60 mL/min   Anion gap 14 5 - 15    Comment: Performed at [facility], [address] [place], [place] [address]  Ethanol     Status: None   Collection Time:  04/06/19  9:50 PM  Result Value Ref Range   Alcohol, Ethyl (B) <10 <10 mg/dL    Comment: (NOTE) Lowest detectable limit for serum alcohol is 10 mg/dL. For medical purposes only. Performed at [facility], [address], [place], [place] [address]   CBC with Differential     Status: Abnormal   Collection Time: 04/06/19  9:50 PM  Result Value Ref Range   WBC 10.4 4.0 - 10.5 K/uL   RBC 5.23 4.22 - 5.81 MIL/uL   Hemoglobin 15.3 13.0 - 17.0 g/dL   HCT 74.2 59.5 - 63.8 %   MCV 88.9 80.0 - 100.0 fL   MCH 29.3 26.0 - 34.0 pg   MCHC 32.9 30.0 - 36.0 g/dL   RDW 75.6 43.3 - 29.5 %   Platelets 297 150 - 400 K/uL   nRBC 0.0 0.0 - 0.2 %   Neutrophils Relative % 77 %   Neutro Abs 8.0 (H) 1.7 - 7.7 K/uL   Lymphocytes Relative 15 %   Lymphs Abs 1.5 0.7 - 4.0 K/uL   Monocytes Relative 8 %   Monocytes Absolute 0.8 0.1 - 1.0 K/uL   Eosinophils Relative 0 %   Eosinophils Absolute 0.0 0.0 - 0.5 K/uL   Basophils Relative 0 %   Basophils Absolute 0.0 0.0 - 0.1 K/uL   Immature Granulocytes 0 %   Abs Immature Granulocytes 0.02 0.00 - 0.07 K/uL    Comment: Performed at [facility], [address], [place], [place] [address]  Acetaminophen level     Status: Abnormal   Collection Time: 04/06/19  9:50 PM  Result Value Ref Range   Acetaminophen (Tylenol), Serum <10 (L) 10 - 30 ug/mL    Comment: (NOTE) Therapeutic concentrations vary significantly. A range of 10-30 ug/mL  may be an effective concentration for many patients. However, some  are best treated at concentrations outside of this range. Acetaminophen concentrations >150 ug/mL at 4 hours after ingestion  and >50 ug/mL at 12 hours after ingestion are often associated with  toxic reactions. Performed at [facility], [address], [place], [place] [address]   Salicylate level     Status: Abnormal   Collection Time: 04/06/19  9:50 PM  Result Value Ref Range   Salicylate Lvl <7.0 (L) 7.0 - 30.0 mg/dL    Comment:  Performed at [facility], [address] [place], [place] [address]  CBG monitoring, ED     Status: Abnormal   Collection Time: 04/06/19  9:55 PM  Result Value Ref Range   Glucose-Capillary 362 (H) 70 - 99 mg/dL  Comment: Glucose reference range applies only to samples taken after fasting for at least 8 hours.  Troponin I (High Sensitivity)     Status: None   Collection Time: 04/07/19 12:23 AM  Result Value Ref Range   Troponin I (High Sensitivity) 14 <18 ng/L    Comment: (NOTE) Elevated high sensitivity troponin I (hsTnI) values and significant  changes across serial measurements may suggest ACS but many other  chronic and acute conditions are known to elevate hsTnI results.  Refer to the "Links" section for chest pain algorithms and additional  guidance. Performed at [facility], [address] [place], [place] [address]   Group A Strep by PCR     Status: None   Collection Time: 04/07/19 12:39 AM   Specimen: Throat; Sterile Swab  Result Value Ref Range   Group A Strep by PCR NOT DETECTED NOT DETECTED    Comment: Performed at [facility], [address], [place], [place] [address]  Respiratory Panel by RT PCR (Flu A&B, Covid) - Nasopharyngeal Swab     Status: None   Collection Time: 04/07/19 12:53 AM   Specimen: Nasopharyngeal Swab  Result Value Ref Range   SARS Coronavirus 2 by RT PCR NEGATIVE NEGATIVE    Comment: (NOTE) SARS-CoV-2 target nucleic acids are NOT DETECTED. The SARS-CoV-2 RNA is generally detectable in upper respiratoy specimens during the acute phase of infection. The lowest concentration of SARS-CoV-2 viral copies this assay can detect is 131 copies/mL. A negative result does not preclude SARS-Cov-2 infection and should not be used as the sole basis for treatment or other patient management decisions. A negative result may occur with  improper specimen collection/handling, submission of specimen other than nasopharyngeal swab, presence  of viral mutation(s) within the areas targeted by this assay, and inadequate number of viral copies (<131 copies/mL). A negative result must be combined with clinical observations, patient history, and epidemiological information. The expected result is Negative. Fact Sheet for Patients:  https://www.moore.com/ Fact Sheet for Healthcare Providers:  https://www.young.biz/ This test is not yet ap proved or cleared by the [place] FDA and  has been authorized for detection and/or diagnosis of SARS-CoV-2 by FDA under an Emergency Use Authorization (EUA). This EUA will remain  in effect (meaning this test can be used) for the duration of the COVID-19 declaration under Section 564(b)(1) of the Act, 21 U.S.C. section 360bbb-3(b)(1), unless the authorization is terminated or revoked sooner.    Influenza A by PCR NEGATIVE NEGATIVE   Influenza B by PCR NEGATIVE NEGATIVE    Comment: (NOTE) The Xpert Xpress SARS-CoV-2/FLU/RSV assay is intended as an aid in  the diagnosis of influenza from Nasopharyngeal swab specimens and  should not be used as a sole basis for treatment. Nasal washings and  aspirates are unacceptable for Xpert Xpress SARS-CoV-2/FLU/RSV  testing. Fact Sheet for Patients: https://www.moore.com/ Fact Sheet for Healthcare Providers: https://www.young.biz/ This test is not yet approved or cleared by the [place] FDA and  has been authorized for detection and/or diagnosis of SARS-CoV-2 by  FDA under an Emergency Use Authorization (EUA). This EUA will remain  in effect (meaning this test can be used) for the duration of the  Covid-19 declaration under Section 564(b)(1) of the Act, 21  U.S.C. section 360bbb-3(b)(1), unless the authorization is  terminated or revoked. Performed at [facility], [address], [place], [place] [address]   Lactic acid, plasma     Status: None   Collection  Time: 04/07/19  1:12 AM  Result Value Ref Range   Lactic Acid, Venous 1.6 0.5 - 1.9 mmol/L    Comment: Performed at [facility], [address] [place], [place] [address]  Troponin I (High Sensitivity)     Status: None   Collection Time: 04/07/19  2:35 AM  Result Value Ref Range   Troponin I (High Sensitivity) 15 <18 ng/L    Comment: (NOTE) Elevated high sensitivity troponin I (hsTnI) values and significant  changes across serial measurements may suggest ACS but many other  chronic and acute conditions are known to elevate hsTnI results.  Refer to the "Links" section for chest pain algorithms and additional  guidance. Performed at [facility], [address], [place], [place] [address]   Magnesium     Status: None   Collection Time: 04/07/19  2:35 AM  Result Value Ref Range   Magnesium 1.9 1.7 - 2.4 mg/dL    Comment: Performed at [facility], [address], [place], [place] [address]  Phosphorus     Status: None   Collection Time: 04/07/19  2:35 AM  Result Value Ref Range   Phosphorus 3.7 2.5 - 4.6 mg/dL    Comment: Performed at [facility], [address], [place], [place] [address]  Hemoglobin A1c     Status: Abnormal   Collection Time: 04/07/19  4:02 AM  Result Value Ref Range   Hgb A1c MFr Bld 14.3 (H) 4.8 - 5.6 %    Comment: (NOTE) Pre diabetes:          5.7%-6.4% Diabetes:              >6.4% Glycemic control for   <7.0% adults with diabetes    Mean Plasma Glucose 363.71 mg/dL    Comment: Performed at [facility], [address], [place], [place] [address]  Glucose, capillary     Status: Abnormal   Collection Time: 04/07/19  5:28 AM  Result Value Ref Range   Glucose-Capillary 223 (H) 70 - 99 mg/dL    Comment: Glucose reference range applies only to samples taken after fasting for at least 8 hours.  Glucose, capillary     Status: Abnormal   Collection Time: 04/07/19  8:14 AM  Result Value Ref Range   Glucose-Capillary 252 (H) 70  - 99 mg/dL    Comment: Glucose reference range applies only to samples taken after fasting for at least 8 hours.    CT Head Wo Contrast  Result Date: 04/07/2019 CLINICAL DATA:  Altered mental status EXAM: CT HEAD WITHOUT CONTRAST TECHNIQUE: Contiguous axial images were obtained from the base of the skull through the vertex without intravenous contrast. COMPARISON:  01/18/2019 FINDINGS: Brain: No acute intracranial abnormality. Specifically, no hemorrhage, hydrocephalus, mass lesion, acute infarction, or significant intracranial injury. Mild chronic small vessel disease within the deep white matter. Vascular: No hyperdense vessel or unexpected calcification. Skull: No acute calvarial abnormality. Sinuses/Orbits: Visualized paranasal sinuses and mastoids clear. Orbital soft tissues unremarkable. Other: None IMPRESSION: No acute intracranial abnormality. Mild chronic small vessel disease. Electronically Signed   By: Charlett Nose M.D.   On: 04/07/2019 00:02   CT Soft Tissue Neck W Contrast  Result Date: 04/07/2019 CLINICAL DATA:  Initial evaluation for acute dysphagia. EXAM: CT NECK WITH CONTRAST TECHNIQUE: Multidetector CT imaging of the neck was performed using the standard protocol following the bolus administration of intravenous contrast. CONTRAST:  20mL OMNIPAQUE IOHEXOL 300 MG/ML  SOLN COMPARISON:  None. FINDINGS: Pharynx and larynx: Examination technically limited by motion artifact. Evaluation of the oral cavity limited by streak artifact from dental amalgam. No obvious mass lesion or loculated collection. No appreciable base of tongue lesion. No findings to suggest acute tonsillitis. Parapharyngeal fat maintained. Nasopharynx within normal limits. No retropharyngeal collection. The epiglottis is mildly thickened and edematous in appearance as are the aryepiglottic folds (series 7, image 84). Findings suggestive of acute epiglottitis/supraglottitis. Mild mucosal edema and swelling seen along the left  pharynx, consistent with associated pharyngitis. Left piriform sinus is effaced. No discrete fluid collections. Supraglottic airway remains patent at this time. Glottis itself relatively with within normal limits. Subglottic airway clear. Salivary glands: Salivary glands including the parotid and submandibular glands within normal limits. Thyroid: Thyroid within normal limits. Lymph nodes: No pathologically enlarged or discrete adenopathy seen within the neck. Vascular: Moderate atherosclerotic change about the aortic arch and origin of great vessels as well as about the carotid bifurcations. Scattered vascular calcifications noted within the carotid siphons. Limited intracranial: Unremarkable. Visualized orbits: Unremarkable. Mastoids and visualized paranasal sinuses: Paranasal sinuses are clear. Mastoid air cells and middle ear cavities are well pneumatized and free of fluid. Skeleton: Moderate cervical spondylosis noted at C5-6 and C6-7. No discrete or worrisome osseous lesions. Upper chest: Visualized upper chest demonstrates no acute finding. Mild paraseptal emphysema noted at the lung apices. Other: None. IMPRESSION: 1. Findings suggestive of acute epiglottitis/supraglottitis as above. Airway remains patent at this time. 2. No other acute abnormality within the neck. 3. Moderate cervical spondylosis at C5-6 and C6-7. 4. Aortic Atherosclerosis (ICD10-I70.0). Electronically Signed   By: Jeannine Boga M.D.   On: 04/07/2019 00:29   Blood pressure 135/68, pulse 83, temperature 97.8 F (36.6 C), temperature source Oral, resp. rate 18, height 5\' 6"  (1.676 m), weight 54.4 kg, SpO2 99 %. Physical Exam: General: Awake and alert and Oriented x3.  No longer confused. Head: Atraumatic. Eyes: Pupils are equal, round, reactive to light. Extraocular motion is intact.  Ears: Examination of the ears shows normal auricles and external auditory canals bilaterally.  Nose: Nasal examination shows normal mucosa,  septum, turbinates.  Face: Facial examination shows no asymmetry. Palpation of the face elicit no significant tenderness.  Mouth: Oral cavity examination shows no mucosal lacerations. No significant trismus is noted.  Neck: Palpation of the neck reveals no lymphadenopathy or mass. The trachea is midline. The thyroid is not significantly enlarged. No stridor. Neuro: Cranial nerves 2-12 are all grossly in tact.  Procedure:  Flexible Fiberoptic Laryngoscopy Anesthesia: None Indication: Possible epiglottitis Description: Risks, benefits, and alternatives of flexible endoscopy were explained to the patient. Specific mention was made of the risk of throat numbness with difficulty swallowing, possible bleeding from the nose and mouth, and pain from the procedure.  The patient gave oral consent to proceed.  The flexible scope was inserted into the right nasal cavity and advanced towards the nasopharynx.  Visualized mucosa over the turbinates and septum were normal.  The nasopharynx was clear.  Oropharyngeal walls were symmetric and mobile without lesion, mass, or edema.   Larynx was mobile without lesions. Mildly edematous epiglottis is noted without erythema.  Arytenoid mucosa was mildly edematous.  Posterior commissure with mild edema.  True vocal folds were pale yellow and without mass or lesion.    Assessment/Plan: Mild angioedema without evidence of epiglottitis. This maybe a result of his previous use of lisinopril. - Not symptomatic at this time. - May discontinue all antibiotics. -  Continue decadron. - Will follow clinically.  Romelle Muldoon W Shikita Vaillancourt 04/07/2019, 9:24 AM

## 2019-04-07 NOTE — Evaluation (Signed)
Clinical/Bedside Swallow Evaluation Patient Details  Name: William Acevedo MRN: [id number] Date of Birth: 1955/12/19  Today's Date: 04/07/2019 Time: SLP Start Time (ACUTE ONLY): 0935 SLP Stop Time (ACUTE ONLY): 0955 SLP Time Calculation (min) (ACUTE ONLY): 20 min  Past Medical History:  Past Medical History:  Diagnosis Date  . Carpal tunnel syndrome   . Depression   . Diabetes mellitus without complication ([place])   . Neuropathy    Past Surgical History:  Past Surgical History:  Procedure Laterality Date  . TESTICLE TORSION REDUCTION     HPI:  William Acevedo is a 64 y.o. male with PMH of DM, alcohol and polysubstance abuse who was brought in by EMS at the request of his brother for assistance with substance abuse. Patient with AMS on arrival and subsequently thought to have epiglottitis. It has now been determined he has angioedema due to lisinoprol.  History limited as patient acutely altered and groggy during evaluation. History following mostly obtained from patient's brother. Patient only reports during evaluation that his throat has been bothering him for the last two weeks, unable to provide further history. Patient's brother William Acevedo was contacted by phone and reports that William Acevedo has a 40 year history of alcohol and drug abuse. He lives with him. William Acevedo reported that William Acevedo has not eaten for the last 3-4 days and he has been using crack cocaine. He reports that his brother expressed desire to "get help" and had AMS and therefore he called EMS to bring him to the hospital.   CT of the head was showing no acute intracranial abnormality and mild chronic small vessel disease.  CT soft tissues of the neck had findings suggestive of acute epiglottitis/supraglottitis.  In addition, there were findings of moderate cervical spondylosis at C5-6 and C6-7.     Assessment / Plan / Recommendation Clinical Impression  Clinical swallowing evaluation was completed in setting of angioedema using  thin liquids via spoon, cup and straw, pureed material and dry solids.  The patient did not endorse any issues swallowing prior to admission.  He reported improvement in the swelling this date.  Cranial nerve exam was completed and unremarkable.  Lingual, labial, facial and jaw range of motion and strength were adequate.  Facial sensation appeared to be intact.  He did endorse reduced sensation in his left forehead, otherwise no other difference was reported.  His oral and pharyngeal swallow appeared to be functional.  Mastication of dry solids appeared adequate.  Swallow trigger was appreciated to palpation and consistent overt s/s of aspiration were not seen.  Her had very intermittent throat clear throughout evaluation.  He was able to pass the [facility] Swallowing Protocol.  Recommend begin a  regular diet with thin liquids.  ST will follow briefly for therapeutic diet tolerance given angioedema.   SLP Visit Diagnosis: Dysphagia, unspecified (R13.10)    Aspiration Risk  Mild aspiration risk    Diet Recommendation   Regular with thin liquids  Medication Administration: Whole meds with liquid    Other  Recommendations Oral Care Recommendations: Oral care BID   Follow up Recommendations Other (comment)(TBD)      Frequency and Duration min 2x/week  2 weeks       Prognosis Prognosis for Safe Diet Advancement: Good      Swallow Study   General Date of Onset: 04/06/19 HPI: URIAN Acevedo is a 64 y.o. male with PMH of DM, alcohol and polysubstance abuse who was brought in by EMS at the request of  his brother for assistance with substance abuse. Patient with AMS on arrival and subsequently thought to have epiglottitis. It has now been determined he has angioedema due to lisinoprol.  History limited as patient acutely altered and groggy during evaluation. History following mostly obtained from patient's brother. Patient only reports during evaluation that his throat has been bothering him for the  last two weeks, unable to provide further history. Patient's brother William Acevedo was contacted by phone and reports that William Acevedo has a 40 year history of alcohol and drug abuse. He lives with him. William Acevedo reported that William Acevedo has not eaten for the last 3-4 days and he has been using crack cocaine. He reports that his brother expressed desire to "get help" and had AMS and therefore he called EMS to bring him to the hospital.   CT of the head was showing no acute intracranial abnormality and mild chronic small vessel disease.  CT soft tissues of the neck had findings suggestive of acute epiglottitis/supraglottitis.  In addition, there were findings of moderate cervical spondylosis at C5-6 and C6-7.   Type of Study: Bedside Swallow Evaluation Previous Swallow Assessment: None noted at [facility]. Diet Prior to this Study: NPO Temperature Spikes Noted: No Respiratory Status: Room air History of Recent Intubation: No Behavior/Cognition: Alert;Cooperative;Pleasant mood Oral Cavity Assessment: Within Functional Limits Oral Care Completed by SLP: No Oral Cavity - Dentition: Missing dentition Vision: Functional for self-feeding Self-Feeding Abilities: Able to feed self Patient Positioning: Upright in bed Baseline Vocal Quality: Normal Volitional Cough: Other (Comment)(not tested) Volitional Swallow: Able to elicit    Oral/Motor/Sensory Function Overall Oral Motor/Sensory Function: Within functional limits   Ice Chips Ice chips: Not tested   Thin Liquid Thin Liquid: Within functional limits Presentation: Cup;Self Fed;Spoon;Straw    Nectar Thick Nectar Thick Liquid: Not tested   Honey Thick Honey Thick Liquid: Not tested   Puree Puree: Within functional limits Presentation: Spoon;Self Fed   Solid     Solid: Within functional limits Presentation: Self Fed     William Aguas, MA, CCC-SLP Acute Rehab SLP [phone]  William Acevedo 04/07/2019,10:02 AM

## 2019-04-07 NOTE — Progress Notes (Signed)
Prior to transfer from 6N to 6E, patient states he is missing his personal belongings: t-shirt, jeans, phone, etc. Room searched and no patient belongings in [phone] where he was admitted to from the ED.   Confirmed with ED they do not have his personal belongings.   Hospital security notified of missing items and states they will complete incident report.

## 2019-04-08 DIAGNOSIS — F329 Major depressive disorder, single episode, unspecified: Secondary | ICD-10-CM | POA: Diagnosis present

## 2019-04-08 DIAGNOSIS — F320 Major depressive disorder, single episode, mild: Secondary | ICD-10-CM

## 2019-04-08 DIAGNOSIS — R4182 Altered mental status, unspecified: Secondary | ICD-10-CM

## 2019-04-08 DIAGNOSIS — F1420 Cocaine dependence, uncomplicated: Secondary | ICD-10-CM

## 2019-04-08 LAB — CBC
HCT: 34.6 % — ABNORMAL LOW (ref 39.0–52.0)
Hemoglobin: 11.4 g/dL — ABNORMAL LOW (ref 13.0–17.0)
MCH: 29.3 pg (ref 26.0–34.0)
MCHC: 32.9 g/dL (ref 30.0–36.0)
MCV: 88.9 fL (ref 80.0–100.0)
Platelets: 234 10*3/uL (ref 150–400)
RBC: 3.89 MIL/uL — ABNORMAL LOW (ref 4.22–5.81)
RDW: 11.9 % (ref 11.5–15.5)
WBC: 8.3 10*3/uL (ref 4.0–10.5)
nRBC: 0 % (ref 0.0–0.2)

## 2019-04-08 LAB — MAGNESIUM: Magnesium: 2.1 mg/dL (ref 1.7–2.4)

## 2019-04-08 LAB — GLUCOSE, CAPILLARY
Glucose-Capillary: 100 mg/dL — ABNORMAL HIGH (ref 70–99)
Glucose-Capillary: 108 mg/dL — ABNORMAL HIGH (ref 70–99)
Glucose-Capillary: 112 mg/dL — ABNORMAL HIGH (ref 70–99)
Glucose-Capillary: 261 mg/dL — ABNORMAL HIGH (ref 70–99)
Glucose-Capillary: 92 mg/dL (ref 70–99)
Glucose-Capillary: 95 mg/dL (ref 70–99)
Glucose-Capillary: 98 mg/dL (ref 70–99)

## 2019-04-08 LAB — COMPREHENSIVE METABOLIC PANEL
ALT: 11 U/L (ref 0–44)
AST: 18 U/L (ref 15–41)
Albumin: 2.8 g/dL — ABNORMAL LOW (ref 3.5–5.0)
Alkaline Phosphatase: 44 U/L (ref 38–126)
Anion gap: 10 (ref 5–15)
BUN: 18 mg/dL (ref 8–23)
CO2: 26 mmol/L (ref 22–32)
Calcium: 8.3 mg/dL — ABNORMAL LOW (ref 8.9–10.3)
Chloride: 103 mmol/L (ref 98–111)
Creatinine, Ser: 0.8 mg/dL (ref 0.61–1.24)
GFR calc Af Amer: >60 mL/min (ref >60)
GFR calc non Af Amer: >60 mL/min (ref >60)
Glucose, Bld: 157 mg/dL — ABNORMAL HIGH (ref 70–99)
Potassium: 3.5 mmol/L (ref 3.5–5.1)
Sodium: 139 mmol/L (ref 135–145)
Total Bilirubin: 0.5 mg/dL (ref 0.3–1.2)
Total Protein: 5.8 g/dL — ABNORMAL LOW (ref 6.5–8.1)

## 2019-04-08 LAB — PROCALCITONIN: Procalcitonin: <0.1 ng/mL

## 2019-04-08 LAB — PHOSPHORUS: Phosphorus: 3.4 mg/dL (ref 2.5–4.6)

## 2019-04-08 MED ORDER — FLUOXETINE HCL 20 MG PO CAPS
20.0000 mg | ORAL_CAPSULE | Freq: Every day | ORAL | Status: DC
  Administered 2019-04-08: 20 mg via ORAL
  Filled 2019-04-08: qty 1

## 2019-04-08 MED ORDER — PREDNISONE 20 MG PO TABS: 40.0000 mg | ORAL_TABLET | Freq: Every day | ORAL | 0 refills | Status: DC

## 2019-04-08 MED ORDER — FLUOXETINE HCL 20 MG PO CAPS: 20.0000 mg | ORAL_CAPSULE | Freq: Every day | ORAL | 0 refills | Status: DC

## 2019-04-08 MED ORDER — INSULIN PEN NEEDLE 29G X 12MM MISC: 0 refills | Status: DC

## 2019-04-08 MED ORDER — ATORVASTATIN CALCIUM 10 MG PO TABS: 20.0000 mg | ORAL_TABLET | Freq: Every day | ORAL | Status: DC

## 2019-04-08 MED ORDER — THIAMINE HCL 100 MG PO TABS: 100.0000 mg | ORAL_TABLET | Freq: Every day | ORAL | 0 refills | Status: DC

## 2019-04-08 MED ORDER — PREDNISONE 20 MG PO TABS
40.0000 mg | ORAL_TABLET | Freq: Every day | ORAL | Status: DC
  Administered 2019-04-08: 40 mg via ORAL
  Filled 2019-04-08: qty 2

## 2019-04-08 MED ORDER — INSULIN ASPART 100 UNIT/ML ~~LOC~~ SOLN: 0.0000 [IU] | SUBCUTANEOUS | Status: DC

## 2019-04-08 MED ORDER — INSULIN GLARGINE 100 UNIT/ML SOLOSTAR PEN: 10.0000 [IU] | PEN_INJECTOR | Freq: Every day | SUBCUTANEOUS | 0 refills | Status: DC

## 2019-04-08 MED ORDER — BLOOD GLUCOSE MONITOR KIT: PACK | 0 refills | Status: DC

## 2019-04-08 MED ORDER — INSULIN GLARGINE 100 UNITS/ML SOLOSTAR PEN: 10.0000 [IU] | PEN_INJECTOR | Freq: Every day | SUBCUTANEOUS | 0 refills | Status: DC

## 2019-04-08 MED ORDER — INSULIN ASPART 100 UNIT/ML ~~LOC~~ SOLN
5.0000 [IU] | Freq: Three times a day (TID) | SUBCUTANEOUS | Status: DC
  Administered 2019-04-08: 5 [IU] via SUBCUTANEOUS

## 2019-04-08 MED ORDER — POTASSIUM CHLORIDE CRYS ER 20 MEQ PO TBCR
50.0000 meq | EXTENDED_RELEASE_TABLET | Freq: Once | ORAL | Status: AC
  Administered 2019-04-08: 50 meq via ORAL
  Filled 2019-04-08: qty 1

## 2019-04-08 MED ORDER — GABAPENTIN 100 MG PO CAPS
100.0000 mg | ORAL_CAPSULE | Freq: Two times a day (BID) | ORAL | Status: DC
  Administered 2019-04-08: 100 mg via ORAL
  Filled 2019-04-08: qty 1

## 2019-04-08 MED ORDER — FOLIC ACID 1 MG PO TABS: 1.0000 mg | ORAL_TABLET | Freq: Every day | ORAL | 0 refills | Status: DC

## 2019-04-08 MED ORDER — NOVOLOG FLEXPEN 100 UNIT/ML ~~LOC~~ SOPN: 5.0000 [IU] | PEN_INJECTOR | Freq: Three times a day (TID) | SUBCUTANEOUS | 11 refills | Status: DC

## 2019-04-08 MED ORDER — INSULIN GLARGINE 100 UNIT/ML ~~LOC~~ SOLN
20.0000 [IU] | Freq: Every day | SUBCUTANEOUS | Status: DC
  Administered 2019-04-08: 20 [IU] via SUBCUTANEOUS
  Filled 2019-04-08: qty 0.2

## 2019-04-08 MED FILL — PENTIPS 31G X 8 MM MISC: 31G X 8 MM | 30 days supply | Qty: 100 | Fill #0

## 2019-04-08 MED FILL — LANTUS SOLOSTAR 100 UNITS/M: 100 | 30 days supply | Qty: 3 | Fill #0

## 2019-04-08 MED FILL — VITAMIN B-1 100 MG TABS: 100 | 30 days supply | Qty: 30 | Fill #0

## 2019-04-08 MED FILL — FLUoxetine HCL 20 MG CAPS: 20 | 30 days supply | Qty: 30 | Fill #0

## 2019-04-08 MED FILL — predniSONE 20 MG TABS: 20 | 1 days supply | Qty: 2 | Fill #0

## 2019-04-08 MED FILL — NOVOLOG FLEXPEN SYRINGE: 100 | 30 days supply | Qty: 6 | Fill #0

## 2019-04-08 MED FILL — FOLIC ACID 1 MG TABS: 1 | 30 days supply | Qty: 30 | Fill #0

## 2019-04-08 NOTE — Progress Notes (Signed)
  Speech Language Pathology Treatment: Dysphagia  Patient Details Name: ROME ECHAVARRIA MRN: [id number] DOB: Aug 19, 1955 Today's Date: 04/08/2019 Time: 1021-1173 SLP Time Calculation (min) (ACUTE ONLY): 11 min  Assessment / Plan / Recommendation Clinical Impression  Pt was seen for skilled ST targeting diet tolerance and diagnostic treatment.  He was encountered awake/alert with a sitter at bedside.  NT reported that pt ate his entire breakfast meal tray this AM without coughing/choking or any signs of difficulty.  Pt was seen with trials of thin liquid and regular solids.  He was noted to be impulsive throughout this tx session, taking large bites and sips despite verbal cues.  Mastication of regular solids was timely and trace oral residue was observed on the pt's posterior lingual surface after swallow initiation.  Pt cleared residue with a cued liquid wash.  No overt s/sx of aspiration were observed with any trials.  Recommend continuation of regular solids and thin liquids with medications administered whole with liquid.  Pt would benefit from supervision during PO intake to cue for compensatory strategies (listed below).  SLP educated pt regarding all recommendations and precautions and he verbalized understanding.  No further skilled ST is warranted at this time.  Please re-consult if additional needs arise.     HPI HPI: JONAVEN HILGERS is a 64 y.o. male with PMH of DM, alcohol and polysubstance abuse who was brought in by EMS at the request of his brother for assistance with substance abuse. Patient with AMS on arrival and subsequently thought to have epiglottitis. It has now been determined he has angioedema due to lisinoprol.  History limited as patient acutely altered and groggy during evaluation. History following mostly obtained from patient's brother. Patient only reports during evaluation that his throat has been bothering him for the last two weeks, unable to provide further history.  Patient's brother [name] was contacted by phone and reports that Talal has a 40 year history of alcohol and drug abuse. He lives with him. [name] reported that Zakye has not eaten for the last 3-4 days and he has been using crack cocaine. He reports that his brother expressed desire to "get help" and had AMS and therefore he called EMS to bring him to the hospital.   CT of the head was showing no acute intracranial abnormality and mild chronic small vessel disease.  CT soft tissues of the neck had findings suggestive of acute epiglottitis/supraglottitis.  In addition, there were findings of moderate cervical spondylosis at C5-6 and C6-7.        SLP Plan  All goals met       Recommendations  Diet recommendations: Regular;Thin liquid Liquids provided via: Cup;Straw Medication Administration: Whole meds with liquid Supervision: Patient able to self feed;Intermittent supervision to cue for compensatory strategies Compensations: Minimize environmental distractions;Slow rate;Small sips/bites Postural Changes and/or Swallow Maneuvers: Seated upright 90 degrees                Oral Care Recommendations: Oral care BID Follow up Recommendations: None SLP Visit Diagnosis: Dysphagia, unspecified (R13.10) Plan: All goals met                      Colin Mulders M.S., CCC-SLP Acute Rehabilitation Services Office: [phone]  [place] 04/08/2019, 9:59 AM

## 2019-04-08 NOTE — Consult Note (Signed)
[facility] Face-to-Face Psychiatry Consult   Reason for Consult:  Capacity  Referring Physician:  Dr Sherral Hammers Patient Identification: William Acevedo MRN:  [id number] Principal Diagnosis: acute encephalopathy Diagnosis:  Active Problems:   Cocaine use disorder, severe, dependence ([place])   MDD (major depressive disorder), recurrent episode, mild ([facility])   Uncomplicated alcohol dependence ([place])   Overdose   Polysubstance abuse ([place])   DM2 (diabetes mellitus, type 2) ([place])   Cocaine use   Acute encephalopathy  Total Time spent with patient: 45 minutes  Subjective:   William Acevedo is a 64 y.o. male patient admitted with acute encephalopathy, consult for capacity.  Patient seen and evaluated in person by this provider for capacity and substance abuse.  Evidently, he had used 2 g of cocaine prior to admission to avoid being arrested.  Long history of cocaine/crack abuse, 42 years.  He also uses benzodiazepines "to calm down."  Prior to admission he was not eating or sleeping.  When asked about this he states, "cocaine takes her appetite and you do not sleep."  Denies suicidal/homicidal ideations, hallucinations, and withdrawal symptoms.  Reports he does well in a "structured environment".  Discussed rehab with him and when he asked "what does it entail", this provider explained to him the rehab/recovery process.  He stated the only cure for this is "money."  He is aware of his physical issues and agrees to stay into the hospital until he is medically cleared.  Patient was alert and oriented x4.  No issues comprehending questions and responded appropriately to them.  He does have capacity at this time to make his own medical decisions.  Social work consult placed for substance abuse resources.  HPI per MD:  William Acevedo is a 64 y.o. male with PMH of DM, alcohol and polysubstance abuse who was brought in by EMS at the request of his brother for assistance with substance abuse. Patient with AMS on  arrival and subsequently found to have epiglottitis. ENT consulted and recommended admission.   History limited as patient acutely altered and groggy during evaluation. History following mostly obtained from patient's brother. Patient only reports during evaluation that his throat has been bothering him for the last two weeks, unable to provide further history. Patient's brother William Acevedo was contacted by phone and reports that William Acevedo has a 40 year history of alcohol and drug abuse. He lives with him. William Acevedo reported that William Acevedo has not eaten for the last 3-4 days and he has been using crack cocaine. He reports that his brother expressed desire to "get help" and had AMS and therefore he called EMS to bring him to the hospital.   Past Psychiatric History: substance abuse/dependence  Risk to Self:  none Risk to Others:  none Prior Inpatient Therapy:  denies Prior Outpatient Therapy:  denies  Past Medical History:  Past Medical History:  Diagnosis Date  . Carpal tunnel syndrome   . Depression   . Diabetes mellitus without complication ([place])   . Neuropathy     Past Surgical History:  Procedure Laterality Date  . TESTICLE TORSION REDUCTION     Family History: History reviewed. No pertinent family history. Family Psychiatric  History: none Social History:  Social History   Substance and Sexual Activity  Alcohol Use Yes     Social History   Substance and Sexual Activity  Drug Use Yes  . Types: Cocaine    Social History   Socioeconomic History  . Marital status: Married    Spouse  name: Not on file  . Number of children: Not on file  . Years of education: Not on file  . Highest education level: Not on file  Occupational History  . Not on file  Tobacco Use  . Smoking status: Current Every Day Smoker    Packs/day: 0.50    Types: Cigarettes  . Smokeless tobacco: Never Used  Substance and Sexual Activity  . Alcohol use: Yes  . Drug use: Yes    Types: Cocaine  . Sexual  activity: Not on file  Other Topics Concern  . Not on file  Social History Narrative  . Not on file   Social Determinants of Health   Financial Resource Strain:   . Difficulty of Paying Living Expenses:   Food Insecurity:   . Worried About Programme researcher, broadcasting/film/video in the Last Year:   . Barista in the Last Year:   Transportation Needs:   . Freight forwarder (Medical):   [name] Lack of Transportation (Non-Medical):   Physical Activity:   . Days of Exercise per Week:   . Minutes of Exercise per Session:   Stress:   . Feeling of Stress :   Social Connections:   . Frequency of Communication with Friends and Family:   . Frequency of Social Gatherings with Friends and Family:   . Attends Religious Services:   . Active Member of Clubs or Organizations:   . Attends Banker Meetings:   [name] Marital Status:    Additional Social History:    Allergies:   Allergies  Allergen Reactions  . Lisinopril Swelling    angioedema    Labs:  Results for orders placed or performed during the hospital encounter of 04/06/19 (from the past 48 hour(s))  Comprehensive metabolic panel     Status: Abnormal   Collection Time: 04/06/19  9:50 PM  Result Value Ref Range   Sodium 134 (L) 135 - 145 mmol/L   Potassium 4.5 3.5 - 5.1 mmol/L   Chloride 96 (L) 98 - 111 mmol/L   CO2 24 22 - 32 mmol/L   Glucose, Bld 342 (H) 70 - 99 mg/dL    Comment: Glucose reference range applies only to samples taken after fasting for at least 8 hours.   BUN 16 8 - 23 mg/dL   Creatinine, Ser 1.61 0.61 - 1.24 mg/dL   Calcium 9.6 8.9 - 09.6 mg/dL   Total Protein 8.4 (H) 6.5 - 8.1 g/dL   Albumin 4.3 3.5 - 5.0 g/dL   AST 20 15 - 41 U/L   ALT 15 0 - 44 U/L   Alkaline Phosphatase 75 38 - 126 U/L   Total Bilirubin 0.7 0.3 - 1.2 mg/dL   GFR calc non Af Amer >60 >60 mL/min   GFR calc Af Amer >60 >60 mL/min   Anion gap 14 5 - 15    Comment: Performed at [facility] Lab, [address] [place], [place]  [address]  Ethanol     Status: None   Collection Time: 04/06/19  9:50 PM  Result Value Ref Range   Alcohol, Ethyl (B) <10 <10 mg/dL    Comment: (NOTE) Lowest detectable limit for serum alcohol is 10 mg/dL. For medical purposes only. Performed at [facility] Lab, [address] [place], [place] [address]   CBC with Differential     Status: Abnormal   Collection Time: 04/06/19  9:50 PM  Result Value Ref Range   WBC 10.4 4.0 -  10.5 K/uL   RBC 5.23 4.22 - 5.81 MIL/uL   Hemoglobin 15.3 13.0 - 17.0 g/dL   HCT 16.1 09.6 - 04.5 %   MCV 88.9 80.0 - 100.0 fL   MCH 29.3 26.0 - 34.0 pg   MCHC 32.9 30.0 - 36.0 g/dL   RDW 40.9 81.1 - 91.4 %   Platelets 297 150 - 400 K/uL   nRBC 0.0 0.0 - 0.2 %   Neutrophils Relative % 77 %   Neutro Abs 8.0 (H) 1.7 - 7.7 K/uL   Lymphocytes Relative 15 %   Lymphs Abs 1.5 0.7 - 4.0 K/uL   Monocytes Relative 8 %   Monocytes Absolute 0.8 0.1 - 1.0 K/uL   Eosinophils Relative 0 %   Eosinophils Absolute 0.0 0.0 - 0.5 K/uL   Basophils Relative 0 %   Basophils Absolute 0.0 0.0 - 0.1 K/uL   Immature Granulocytes 0 %   Abs Immature Granulocytes 0.02 0.00 - 0.07 K/uL    Comment: Performed at [facility] Lab, [address] [place], [place] [address]  Acetaminophen level     Status: Abnormal   Collection Time: 04/06/19  9:50 PM  Result Value Ref Range   Acetaminophen (Tylenol), Serum <10 (L) 10 - 30 ug/mL    Comment: (NOTE) Therapeutic concentrations vary significantly. A range of 10-30 ug/mL  may be an effective concentration for many patients. However, some  are best treated at concentrations outside of this range. Acetaminophen concentrations >150 ug/mL at 4 hours after ingestion  and >50 ug/mL at 12 hours after ingestion are often associated with  toxic reactions. Performed at [facility] Lab, [address] [place], [place] [address]   Salicylate level     Status: Abnormal   Collection Time: 04/06/19  9:50 PM  Result Value Ref Range    Salicylate Lvl <7.0 (L) 7.0 - 30.0 mg/dL    Comment: Performed at [facility] Lab, [address] [place], [place] [address]  CBG monitoring, ED     Status: Abnormal   Collection Time: 04/06/19  9:55 PM  Result Value Ref Range   Glucose-Capillary 362 (H) 70 - 99 mg/dL    Comment: Glucose reference range applies only to samples taken after fasting for at least 8 hours.  Troponin I (High Sensitivity)     Status: None   Collection Time: 04/07/19 12:23 AM  Result Value Ref Range   Troponin I (High Sensitivity) 14 <18 ng/L    Comment: (NOTE) Elevated high sensitivity troponin I (hsTnI) values and significant  changes across serial measurements may suggest ACS but many other  chronic and acute conditions are known to elevate hsTnI results.  Refer to the "Links" section for chest pain algorithms and additional  guidance. Performed at [facility] Lab, [address], [place], [place] [address]   Group A Strep by PCR     Status: None   Collection Time: 04/07/19 12:39 AM   Specimen: Throat; Sterile Swab  Result Value Ref Range   Group A Strep by PCR NOT DETECTED NOT DETECTED    Comment: Performed at [facility] Lab, [address], [place], [place] [address]  Respiratory Panel by RT PCR (Flu A&B, Covid) - Nasopharyngeal Swab     Status: None   Collection Time: 04/07/19 12:53 AM   Specimen: Nasopharyngeal Swab  Result Value Ref Range   SARS Coronavirus 2 by RT PCR NEGATIVE NEGATIVE    Comment: (NOTE) SARS-CoV-2 target nucleic acids  are NOT DETECTED. The SARS-CoV-2 RNA is generally detectable in upper respiratoy specimens during the acute phase of infection. The lowest concentration of SARS-CoV-2 viral copies this assay can detect is 131 copies/mL. A negative result does not preclude SARS-Cov-2 infection and should not be used as the sole basis for treatment or other patient management decisions. A negative result may occur with  improper specimen collection/handling, submission  of specimen other than nasopharyngeal swab, presence of viral mutation(s) within the areas targeted by this assay, and inadequate number of viral copies (<131 copies/mL). A negative result must be combined with clinical observations, patient history, and epidemiological information. The expected result is Negative. Fact Sheet for Patients:  https://www.moore.com/ Fact Sheet for Healthcare Providers:  https://www.young.biz/ This test is not yet ap proved or cleared by the [place] FDA and  has been authorized for detection and/or diagnosis of SARS-CoV-2 by FDA under an Emergency Use Authorization (EUA). This EUA will remain  in effect (meaning this test can be used) for the duration of the COVID-19 declaration under Section 564(b)(1) of the Act, 21 U.S.C. section 360bbb-3(b)(1), unless the authorization is terminated or revoked sooner.    Influenza A by PCR NEGATIVE NEGATIVE   Influenza B by PCR NEGATIVE NEGATIVE    Comment: (NOTE) The Xpert Xpress SARS-CoV-2/FLU/RSV assay is intended as an aid in  the diagnosis of influenza from Nasopharyngeal swab specimens and  should not be used as a sole basis for treatment. Nasal washings and  aspirates are unacceptable for Xpert Xpress SARS-CoV-2/FLU/RSV  testing. Fact Sheet for Patients: https://www.moore.com/ Fact Sheet for Healthcare Providers: https://www.young.biz/ This test is not yet approved or cleared by the [place] FDA and  has been authorized for detection and/or diagnosis of SARS-CoV-2 by  FDA under an Emergency Use Authorization (EUA). This EUA will remain  in effect (meaning this test can be used) for the duration of the  Covid-19 declaration under Section 564(b)(1) of the Act, 21  U.S.C. section 360bbb-3(b)(1), unless the authorization is  terminated or revoked. Performed at [facility] Lab, [address], [place], [place] [address]    Lactic acid, plasma     Status: None   Collection Time: 04/07/19  1:12 AM  Result Value Ref Range   Lactic Acid, Venous 1.6 0.5 - 1.9 mmol/L    Comment: Performed at [facility] Lab, [address], [place], [place] [address]  Troponin I (High Sensitivity)     Status: None   Collection Time: 04/07/19  2:35 AM  Result Value Ref Range   Troponin I (High Sensitivity) 15 <18 ng/L    Comment: (NOTE) Elevated high sensitivity troponin I (hsTnI) values and significant  changes across serial measurements may suggest ACS but many other  chronic and acute conditions are known to elevate hsTnI results.  Refer to the "Links" section for chest pain algorithms and additional  guidance. Performed at [facility], [address], [place], [place] [address]   Magnesium     Status: None   Collection Time: 04/07/19  2:35 AM  Result Value Ref Range   Magnesium 1.9 1.7 - 2.4 mg/dL    Comment: Performed at [facility], [address], [place], [place] [address]  Phosphorus     Status: None   Collection Time: 04/07/19  2:35 AM  Result Value Ref Range   Phosphorus 3.7 2.5 - 4.6 mg/dL    Comment: Performed at [facility], [address], [place],  [place] [address]  HIV Antibody (routine testing w rflx)     Status: None   Collection Time: 04/07/19  4:02 AM  Result Value Ref Range   HIV Screen 4th Generation wRfx NON REACTIVE NON REACTIVE    Comment: Performed at [facility], [address], [place], [place] [address]  Hemoglobin A1c     Status: Abnormal   Collection Time: 04/07/19  4:02 AM  Result Value Ref Range   Hgb A1c MFr Bld 14.3 (H) 4.8 - 5.6 %    Comment: (NOTE) Pre diabetes:          5.7%-6.4% Diabetes:              >6.4% Glycemic control for   <7.0% adults with diabetes    Mean Plasma Glucose 363.71 mg/dL    Comment: Performed at [facility], [address] [place], [place] [address]  Glucose, capillary     Status: Abnormal   Collection Time:  04/07/19  5:28 AM  Result Value Ref Range   Glucose-Capillary 223 (H) 70 - 99 mg/dL    Comment: Glucose reference range applies only to samples taken after fasting for at least 8 hours.  Glucose, capillary     Status: Abnormal   Collection Time: 04/07/19  8:14 AM  Result Value Ref Range   Glucose-Capillary 252 (H) 70 - 99 mg/dL    Comment: Glucose reference range applies only to samples taken after fasting for at least 8 hours.  Comprehensive metabolic panel     Status: Abnormal   Collection Time: 04/07/19  9:10 AM  Result Value Ref Range   Sodium 138 135 - 145 mmol/L   Potassium 4.4 3.5 - 5.1 mmol/L   Chloride 104 98 - 111 mmol/L   CO2 23 22 - 32 mmol/L   Glucose, Bld 304 (H) 70 - 99 mg/dL    Comment: Glucose reference range applies only to samples taken after fasting for at least 8 hours.   BUN 18 8 - 23 mg/dL   Creatinine, Ser 9.14 0.61 - 1.24 mg/dL   Calcium 8.6 (L) 8.9 - 10.3 mg/dL   Total Protein 7.0 6.5 - 8.1 g/dL   Albumin 3.3 (L) 3.5 - 5.0 g/dL   AST 15 15 - 41 U/L   ALT 14 0 - 44 U/L   Alkaline Phosphatase 58 38 - 126 U/L   Total Bilirubin 0.9 0.3 - 1.2 mg/dL   GFR calc non Af Amer >60 >60 mL/min   GFR calc Af Amer >60 >60 mL/min   Anion gap 11 5 - 15    Comment: Performed at [facility] Lab, [address], [place], [place] [address]  Magnesium     Status: None   Collection Time: 04/07/19  9:10 AM  Result Value Ref Range   Magnesium 2.3 1.7 - 2.4 mg/dL    Comment: Performed at [facility], [address], [place], [place] [address]  Phosphorus     Status: Abnormal   Collection Time: 04/07/19  9:10 AM  Result Value Ref Range   Phosphorus 4.9 (H) 2.5 - 4.6 mg/dL    Comment: Performed at [facility], [address], [place], [place] [address]  CBC with Differential/Platelet     Status: None   Collection Time: 04/07/19  9:10 AM  Result Value Ref Range   WBC 6.8 4.0 - 10.5 K/uL   RBC 4.52 4.22 - 5.81 MIL/uL   Hemoglobin 13.0 13.0 -  17.0 g/dL   HCT  85.6 31.4 - 97.0 %   MCV 88.1 80.0 - 100.0 fL   MCH 28.8 26.0 - 34.0 pg   MCHC 32.7 30.0 - 36.0 g/dL   RDW 26.3 78.5 - 88.5 %   Platelets 258 150 - 400 K/uL   nRBC 0.0 0.0 - 0.2 %   Neutrophils Relative % 88 %   Neutro Abs 5.9 1.7 - 7.7 K/uL   Lymphocytes Relative 10 %   Lymphs Abs 0.7 0.7 - 4.0 K/uL   Monocytes Relative 2 %   Monocytes Absolute 0.1 0.1 - 1.0 K/uL   Eosinophils Relative 0 %   Eosinophils Absolute 0.0 0.0 - 0.5 K/uL   Basophils Relative 0 %   Basophils Absolute 0.0 0.0 - 0.1 K/uL   Immature Granulocytes 0 %   Abs Immature Granulocytes 0.02 0.00 - 0.07 K/uL    Comment: Performed at [facility] Lab, [address] [place], [place] [address]  Acetaminophen level     Status: Abnormal   Collection Time: 04/07/19  9:10 AM  Result Value Ref Range   Acetaminophen (Tylenol), Serum <10 (L) 10 - 30 ug/mL    Comment: (NOTE) Therapeutic concentrations vary significantly. A range of 10-30 ug/mL  may be an effective concentration for many patients. However, some  are best treated at concentrations outside of this range. Acetaminophen concentrations >150 ug/mL at 4 hours after ingestion  and >50 ug/mL at 12 hours after ingestion are often associated with  toxic reactions. Performed at [facility], [address] [place], [place] [address]   Lipid panel     Status: Abnormal   Collection Time: 04/07/19  9:10 AM  Result Value Ref Range   Cholesterol 193 0 - 200 mg/dL   Triglycerides 52 <676 mg/dL   HDL 68 >72 mg/dL   Total CHOL/HDL Ratio 2.8 RATIO   VLDL 10 0 - 40 mg/dL   LDL Cholesterol 094 (H) 0 - 99 mg/dL    Comment:        Total Cholesterol/HDL:CHD Risk Coronary Heart Disease Risk Table                     Men   Women  1/2 Average Risk   3.4   3.3  Average Risk       5.0   4.4  2 X Average Risk   9.6   7.1  3 X Average Risk  23.4   11.0        Use the calculated Patient Ratio above and the CHD Risk Table to determine the patient's CHD Risk.         ATP III CLASSIFICATION (LDL):  <100     mg/dL   Optimal  709-628  mg/dL   Near or Above                    Optimal  130-159  mg/dL   Borderline  366-294  mg/dL   High  >765     mg/dL   Very High Performed at [facility], [address] [place], [place] [address]   Procalcitonin - Baseline     Status: None   Collection Time: 04/07/19  9:10 AM  Result Value Ref Range   Procalcitonin <0.10 ng/mL    Comment:        Interpretation: PCT (Procalcitonin) <= 0.5 ng/mL: Systemic infection (sepsis) is not likely. Local bacterial infection is possible. (NOTE)  Sepsis PCT Algorithm           Lower Respiratory Tract                                      Infection PCT Algorithm    ----------------------------     ----------------------------         PCT < 0.25 ng/mL                PCT < 0.10 ng/mL         Strongly encourage             Strongly discourage   discontinuation of antibiotics    initiation of antibiotics    ----------------------------     -----------------------------       PCT 0.25 - 0.50 ng/mL            PCT 0.10 - 0.25 ng/mL               OR       >80% decrease in PCT            Discourage initiation of                                            antibiotics      Encourage discontinuation           of antibiotics    ----------------------------     -----------------------------         PCT >= 0.50 ng/mL              PCT 0.26 - 0.50 ng/mL               AND        <80% decrease in PCT             Encourage initiation of                                             antibiotics       Encourage continuation           of antibiotics    ----------------------------     -----------------------------        PCT >= 0.50 ng/mL                  PCT > 0.50 ng/mL               AND         increase in PCT                  Strongly encourage                                      initiation of antibiotics    Strongly encourage escalation           of antibiotics                                      -----------------------------  PCT <= 0.25 ng/mL                                                 OR                                        > 80% decrease in PCT                                     Discontinue / Do not initiate                                             antibiotics Performed at [facility], [address], [place], [place] [address]   Urine rapid drug screen (hosp performed)     Status: Abnormal   Collection Time: 04/07/19 10:24 AM  Result Value Ref Range   Opiates NONE DETECTED NONE DETECTED   Cocaine POSITIVE (A) NONE DETECTED   Benzodiazepines POSITIVE (A) NONE DETECTED   Amphetamines NONE DETECTED NONE DETECTED   Tetrahydrocannabinol NONE DETECTED NONE DETECTED   Barbiturates NONE DETECTED NONE DETECTED    Comment: (NOTE) DRUG SCREEN FOR MEDICAL PURPOSES ONLY.  IF CONFIRMATION IS NEEDED FOR ANY PURPOSE, NOTIFY LAB WITHIN 5 DAYS. LOWEST DETECTABLE LIMITS FOR URINE DRUG SCREEN Drug Class                     Cutoff (ng/mL) Amphetamine and metabolites    1000 Barbiturate and metabolites    200 Benzodiazepine                 200 Tricyclics and metabolites     300 Opiates and metabolites        300 Cocaine and metabolites        300 THC                            50 Performed at [facility], [address] [place], [place] [address]   Urinalysis, Routine w reflex microscopic     Status: Abnormal   Collection Time: 04/07/19 10:25 AM  Result Value Ref Range   Color, Urine YELLOW YELLOW   APPearance CLEAR CLEAR   Specific Gravity, Urine 1.045 (H) 1.005 - 1.030   pH 5.0 5.0 - 8.0   Glucose, UA >=500 (A) NEGATIVE mg/dL   Hgb urine dipstick NEGATIVE NEGATIVE   Bilirubin Urine NEGATIVE NEGATIVE   Ketones, ur 20 (A) NEGATIVE mg/dL   Protein, ur 621 (A) NEGATIVE mg/dL   Nitrite NEGATIVE NEGATIVE   Leukocytes,Ua NEGATIVE NEGATIVE   RBC / HPF 0-5 0 - 5 RBC/hpf   WBC, UA 0-5 0 - 5  WBC/hpf   Bacteria, UA NONE SEEN NONE SEEN   Mucus PRESENT     Comment: Performed at [facility] Lab, [address] [place], [place] [address]  Glucose, capillary     Status: Abnormal   Collection Time: 04/07/19 12:04 PM  Result Value Ref Range   Glucose-Capillary >600 (HH) 70 -  99 mg/dL    Comment: Glucose reference range applies only to samples taken after fasting for at least 8 hours.  Glucose, random     Status: Abnormal   Collection Time: 04/07/19 12:29 PM  Result Value Ref Range   Glucose, Bld 733 (HH) 70 - 99 mg/dL    Comment: Glucose reference range applies only to samples taken after fasting for at least 8 hours. CRITICAL RESULT CALLED TO, READ BACK BY AND VERIFIED WITH: RN Georgina Snell AT 1303 04/07/19 BY L BENFIELD Performed at [facility] Lab, [address], [place], [place] [address]   Beta-hydroxybutyric acid     Status: Abnormal   Collection Time: 04/07/19 12:29 PM  Result Value Ref Range   Beta-Hydroxybutyric Acid 0.34 (H) 0.05 - 0.27 mmol/L    Comment: Performed at [facility] Lab, [address] [place], [place] [address]  Glucose, capillary     Status: Abnormal   Collection Time: 04/07/19  1:33 PM  Result Value Ref Range   Glucose-Capillary >600 (HH) 70 - 99 mg/dL    Comment: Glucose reference range applies only to samples taken after fasting for at least 8 hours.  Glucose, capillary     Status: Abnormal   Collection Time: 04/07/19  2:01 PM  Result Value Ref Range   Glucose-Capillary >600 (HH) 70 - 99 mg/dL    Comment: Glucose reference range applies only to samples taken after fasting for at least 8 hours.  Glucose, capillary     Status: Abnormal   Collection Time: 04/07/19  2:32 PM  Result Value Ref Range   Glucose-Capillary >600 (HH) 70 - 99 mg/dL    Comment: Glucose reference range applies only to samples taken after fasting for at least 8 hours.  Glucose, capillary     Status: Abnormal   Collection Time: 04/07/19  3:01 PM  Result Value Ref Range    Glucose-Capillary 443 (H) 70 - 99 mg/dL    Comment: Glucose reference range applies only to samples taken after fasting for at least 8 hours.  Glucose, capillary     Status: Abnormal   Collection Time: 04/07/19  3:32 PM  Result Value Ref Range   Glucose-Capillary 461 (H) 70 - 99 mg/dL    Comment: Glucose reference range applies only to samples taken after fasting for at least 8 hours.  Glucose, capillary     Status: Abnormal   Collection Time: 04/07/19  4:02 PM  Result Value Ref Range   Glucose-Capillary 340 (H) 70 - 99 mg/dL    Comment: Glucose reference range applies only to samples taken after fasting for at least 8 hours.  Glucose, capillary     Status: Abnormal   Collection Time: 04/07/19  4:59 PM  Result Value Ref Range   Glucose-Capillary 499 (H) 70 - 99 mg/dL    Comment: Glucose reference range applies only to samples taken after fasting for at least 8 hours.  Glucose, capillary     Status: Abnormal   Collection Time: 04/07/19  5:43 PM  Result Value Ref Range   Glucose-Capillary 353 (H) 70 - 99 mg/dL    Comment: Glucose reference range applies only to samples taken after fasting for at least 8 hours.  Glucose, capillary     Status: Abnormal   Collection Time: 04/07/19  6:15 PM  Result Value Ref Range   Glucose-Capillary 123 (H) 70 - 99 mg/dL    Comment: Glucose reference range applies only to samples taken after fasting for at  least 8 hours.  Glucose, capillary     Status: Abnormal   Collection Time: 04/07/19  6:42 PM  Result Value Ref Range   Glucose-Capillary 151 (H) 70 - 99 mg/dL    Comment: Glucose reference range applies only to samples taken after fasting for at least 8 hours.  Glucose, capillary     Status: Abnormal   Collection Time: 04/07/19  7:18 PM  Result Value Ref Range   Glucose-Capillary 127 (H) 70 - 99 mg/dL    Comment: Glucose reference range applies only to samples taken after fasting for at least 8 hours.  Glucose, capillary     Status: Abnormal    Collection Time: 04/07/19  8:18 PM  Result Value Ref Range   Glucose-Capillary 56 (L) 70 - 99 mg/dL    Comment: Glucose reference range applies only to samples taken after fasting for at least 8 hours.  Glucose, capillary     Status: Abnormal   Collection Time: 04/07/19  9:04 PM  Result Value Ref Range   Glucose-Capillary 160 (H) 70 - 99 mg/dL    Comment: Glucose reference range applies only to samples taken after fasting for at least 8 hours.  Glucose, capillary     Status: Abnormal   Collection Time: 04/07/19 10:07 PM  Result Value Ref Range   Glucose-Capillary 105 (H) 70 - 99 mg/dL    Comment: Glucose reference range applies only to samples taken after fasting for at least 8 hours.  Glucose, capillary     Status: Abnormal   Collection Time: 04/07/19 11:10 PM  Result Value Ref Range   Glucose-Capillary 156 (H) 70 - 99 mg/dL    Comment: Glucose reference range applies only to samples taken after fasting for at least 8 hours.  Glucose, capillary     Status: None   Collection Time: 04/08/19 12:28 AM  Result Value Ref Range   Glucose-Capillary 98 70 - 99 mg/dL    Comment: Glucose reference range applies only to samples taken after fasting for at least 8 hours.  Glucose, capillary     Status: None   Collection Time: 04/08/19  1:05 AM  Result Value Ref Range   Glucose-Capillary 95 70 - 99 mg/dL    Comment: Glucose reference range applies only to samples taken after fasting for at least 8 hours.  Glucose, capillary     Status: None   Collection Time: 04/08/19  2:09 AM  Result Value Ref Range   Glucose-Capillary 92 70 - 99 mg/dL    Comment: Glucose reference range applies only to samples taken after fasting for at least 8 hours.  Glucose, capillary     Status: Abnormal   Collection Time: 04/08/19  3:24 AM  Result Value Ref Range   Glucose-Capillary 100 (H) 70 - 99 mg/dL    Comment: Glucose reference range applies only to samples taken after fasting for at least 8 hours.  Glucose,  capillary     Status: Abnormal   Collection Time: 04/08/19  4:09 AM  Result Value Ref Range   Glucose-Capillary 108 (H) 70 - 99 mg/dL    Comment: Glucose reference range applies only to samples taken after fasting for at least 8 hours.  Comprehensive metabolic panel     Status: Abnormal   Collection Time: 04/08/19  5:59 AM  Result Value Ref Range   Sodium 139 135 - 145 mmol/L   Potassium 3.5 3.5 - 5.1 mmol/L    Comment: NO VISIBLE HEMOLYSIS   Chloride 103 98 -  111 mmol/L   CO2 26 22 - 32 mmol/L   Glucose, Bld 157 (H) 70 - 99 mg/dL    Comment: Glucose reference range applies only to samples taken after fasting for at least 8 hours.   BUN 18 8 - 23 mg/dL   Creatinine, Ser 1.61 0.61 - 1.24 mg/dL   Calcium 8.3 (L) 8.9 - 10.3 mg/dL   Total Protein 5.8 (L) 6.5 - 8.1 g/dL   Albumin 2.8 (L) 3.5 - 5.0 g/dL   AST 18 15 - 41 U/L   ALT 11 0 - 44 U/L   Alkaline Phosphatase 44 38 - 126 U/L   Total Bilirubin 0.5 0.3 - 1.2 mg/dL   GFR calc non Af Amer >60 >60 mL/min   GFR calc Af Amer >60 >60 mL/min   Anion gap 10 5 - 15    Comment: Performed at [facility], [address] [place], [place] [address]  CBC     Status: Abnormal   Collection Time: 04/08/19  5:59 AM  Result Value Ref Range   WBC 8.3 4.0 - 10.5 K/uL   RBC 3.89 (L) 4.22 - 5.81 MIL/uL   Hemoglobin 11.4 (L) 13.0 - 17.0 g/dL   HCT 54.0 (L) 98.1 - 19.1 %   MCV 88.9 80.0 - 100.0 fL   MCH 29.3 26.0 - 34.0 pg   MCHC 32.9 30.0 - 36.0 g/dL   RDW 47.8 29.5 - 62.1 %   Platelets 234 150 - 400 K/uL   nRBC 0.0 0.0 - 0.2 %    Comment: Performed at [facility], [address], [place], [place] [address]  Magnesium     Status: None   Collection Time: 04/08/19  5:59 AM  Result Value Ref Range   Magnesium 2.1 1.7 - 2.4 mg/dL    Comment: Performed at [facility], [address], [place], [place] [address]  Phosphorus     Status: None   Collection Time: 04/08/19  5:59 AM  Result Value Ref Range   Phosphorus 3.4 2.5 - 4.6  mg/dL    Comment: Performed at [facility], [address], [place], [place] [address]  Procalcitonin     Status: None   Collection Time: 04/08/19  5:59 AM  Result Value Ref Range   Procalcitonin <0.10 ng/mL    Comment:        Interpretation: PCT (Procalcitonin) <= 0.5 ng/mL: Systemic infection (sepsis) is not likely. Local bacterial infection is possible. (NOTE)       Sepsis PCT Algorithm           Lower Respiratory Tract                                      Infection PCT Algorithm    ----------------------------     ----------------------------         PCT < 0.25 ng/mL                PCT < 0.10 ng/mL         Strongly encourage             Strongly discourage   discontinuation of antibiotics    initiation of antibiotics    ----------------------------     -----------------------------       PCT 0.25 - 0.50 ng/mL            PCT  0.10 - 0.25 ng/mL               OR       >80% decrease in PCT            Discourage initiation of                                            antibiotics      Encourage discontinuation           of antibiotics    ----------------------------     -----------------------------         PCT >= 0.50 ng/mL              PCT 0.26 - 0.50 ng/mL               AND        <80% decrease in PCT             Encourage initiation of                                             antibiotics       Encourage continuation           of antibiotics    ----------------------------     -----------------------------        PCT >= 0.50 ng/mL                  PCT > 0.50 ng/mL               AND         increase in PCT                  Strongly encourage                                      initiation of antibiotics    Strongly encourage escalation           of antibiotics                                     -----------------------------                                           PCT <= 0.25 ng/mL                                                 OR                                         > 80% decrease in PCT  Discontinue / Do not initiate                                             antibiotics Performed at [facility], [address] [place], [place] [address]   Glucose, capillary     Status: Abnormal   Collection Time: 04/08/19  7:56 AM  Result Value Ref Range   Glucose-Capillary 112 (H) 70 - 99 mg/dL    Comment: Glucose reference range applies only to samples taken after fasting for at least 8 hours.    Current Facility-Administered Medications  Medication Dose Route Frequency Provider Last Rate Last Admin  . atorvastatin (LIPITOR) tablet 20 mg  20 mg Oral q1800 Drema Dallas, MD      . dextrose 5 %-0.45 % sodium chloride infusion   Intravenous Continuous Drema Dallas, MD 75 mL/hr at 04/08/19 0735 New Bag at 04/08/19 0735  . dextrose 50 % solution 0-50 mL  0-50 mL Intravenous PRN Drema Dallas, MD   25 mL at 04/07/19 2038  . enoxaparin (LOVENOX) injection 40 mg  40 mg Subcutaneous Q24H Fair, Hoyle Sauer, MD   40 mg at 04/08/19 0404  . folic acid injection 1 mg  1 mg Intravenous Daily Drema Dallas, MD   1 mg at 04/07/19 1152  . insulin aspart (novoLOG) injection 5 Units  5 Units Subcutaneous TID WC Drema Dallas, MD      . insulin glargine (LANTUS) injection 20 Units  20 Units Subcutaneous Daily Audrea Muscat T, NP   20 Units at 04/08/19 0441  . LORazepam (ATIVAN) injection 1 mg  1 mg Intravenous Q6H PRN Fair, Chelsea N, MD      . LORazepam (ATIVAN) tablet 1-4 mg  1-4 mg Oral Q1H PRN Fair, Hoyle Sauer, MD       Or  . LORazepam (ATIVAN) injection 1-4 mg  1-4 mg Intravenous Q1H PRN Joselyn Arrow, MD   1 mg at 04/07/19 1833  . LORazepam (ATIVAN) injection 2 mg  2 mg Intravenous TID Drema Dallas, MD   2 mg at 04/08/19 1059  . predniSONE (DELTASONE) tablet 40 mg  40 mg Oral Q breakfast Drema Dallas, MD   40 mg at 04/08/19 1914  . thiamine tablet 100 mg  100 mg Oral Daily Fair, Hoyle Sauer, MD   100 mg at  04/08/19 7829   Or  . thiamine (B-1) injection 100 mg  100 mg Intravenous Daily Fair, Hoyle Sauer, MD        Musculoskeletal: Strength & Muscle Tone: decreased Gait & Station: did not witness Patient leans: N/A  Psychiatric Specialty Exam: Physical Exam  Nursing note and vitals reviewed. Constitutional: He is oriented to person, place, and time. He appears well-developed.  HENT:  Head: Normocephalic.  Respiratory: Effort normal.  Musculoskeletal:     Cervical back: Normal range of motion.  Neurological: He is alert and oriented to person, place, and time.  Psychiatric: He has a normal mood and affect. His speech is normal and behavior is normal. Judgment and thought content normal. Cognition and memory are normal.    Review of Systems  All other systems reviewed and are negative.   Blood pressure 120/87, pulse 86, temperature 97.7 F (36.5 C), temperature source Oral, resp. rate 18, height 5\' 6"  (1.676 m), weight 52.5 kg, SpO2 96 %.Body  mass index is 18.68 kg/m.  General Appearance: Casual  Eye Contact:  Good  Speech:  Normal Rate  Volume:  Normal  Mood:  Depressed, mild  Affect:  Congruent  Thought Process:  Coherent and Descriptions of Associations: Intact  Orientation:  Full (Time, Place, and Person)  Thought Content:  WDL and Logical  Suicidal Thoughts:  No  Homicidal Thoughts:  No  Memory:  Immediate;   Fair Recent;   Fair Remote;   Fair  Judgement:  Fair  Insight:  Fair  Psychomotor Activity:  Decreased  Concentration:  Concentration: Fair and Attention Span: Fair  Recall:  [employer] of Knowledge:  Fair  Language:  Good  Akathisia:  No  Handed:  Right  AIMS (if indicated):     Assets:  Housing Leisure Time Physical Health Resilience Social Support  ADL's:  Intact  Cognition:  WNL  Sleep:      64 year old male who presented to the ED per his brother for long history of substance abuse, predominantly cocaine.  He has used for the past 42 years.  Denies  suicidal/homicidal ideations, hallucinations, and withdrawal symptoms.  Recommended rehab to him and he was ambivalent.  Treatment Plan Summary: Cocaine use disorder, severe, dependence -Recommend gabapentin 100 mg twice daily -Recommend rehab, patient did not seem interested in the process, will ask social work to present options for him  Major depressive disorder recurrent mild: -Recommend Prozac 20 mg daily  Disposition: No evidence of imminent risk to self or others at present.    Nanine Means, NP 04/08/2019 11:31 AM

## 2019-04-08 NOTE — Progress Notes (Signed)
Patient challenging to manage.  Stated he wanted to leave AMA.  Dr. Joseph Art notified, and agreed to discharge patient.  Patient increasingly impatient while waiting for discharge information and medication delivery.  Patient received all the above.  When brother arrived to pick him up, patient was gone from room.  Security and NT searched but did not find patient.  I called the brother, who was waiting in the [place] area, and he had not seen him either.  I asked the brother to call me if he appeared, but he did not.  Further calls to brother were unanswered. Charge RN and MD aware of situation.

## 2019-04-08 NOTE — TOC Progression Note (Signed)
Transition of Care (TOC) - Progression Note    Patient Details  Name: Xiomar C Driver MRN: [id number] Date of Birth: 06/07/1955  Transition of Care (TOC) CM/SW Contact   W , LCSWA Phone Number: 04/08/2019, 11:38 AM  Clinical Narrative:     CSW acknowledges TOC consult for substance abuse resources.  CSW met with pt at bedside and introduce self and role.  Pt is agreeable to reach out to residential treatment facilities for substance abuse treatment.  No further needs at this time.  Please consult if further needs arise.        Expected Discharge Plan and Services                                                 Social Determinants of Health (SDOH) Interventions    Readmission Risk Interventions No flowsheet data found.  

## 2019-04-08 NOTE — TOC Transition Note (Signed)
Transition of Care [facility]) - CM/SW Discharge Note   Patient Details  Name: William Acevedo MRN: [id number] Date of Birth: 1955/12/31  [facility]) CM/SW Contact:  Sharin Mons, RN Phone Number: [phone] 04/08/2019, 4:23 PM   Clinical Narrative:   Pt will transition to home with brother.[facility]. TOC pharmacy to fill and delivered meds to pt 's bedside prior to d/c. Glucometer  test strips, and lancets  prescription will be given to brother for filling if needed. Brother states pt already had glucometer kit and supplies.  [name])     [phone]      Brother to provide transportation to home.  Final next level of care: Home/Self Care Barriers to Discharge: No Barriers Identified   Patient Goals and CMS Choice        Discharge Placement   Discharge Plan and Services     Social Determinants of Health (SDOH) Interventions     Readmission Risk Interventions No flowsheet data found.

## 2019-04-08 NOTE — Progress Notes (Signed)
Subjective: No respiratory difficulty overnight. No stridor or oxygen desaturation.  Objective: Vital signs in last 24 hours: Temp:  [97.5 F (36.4 C)-98.2 F (36.8 C)] 97.8 F (36.6 C) (03/21 1505) Pulse Rate:  [82-102] 84 (03/22 0731) Resp:  [16-18] 18 (03/22 0731) BP: (100-122)/(62-87) 120/87 (03/22 0731) SpO2:  [96 %-100 %] 96 % (03/22 0731) Weight:  [52.5 kg] 52.5 kg (03/22 0550)  Physical Exam: Head: Atraumatic, normocephalic. Ears: Examination of the ears shows normal auricles and external auditory canals bilaterally.  Nose: Nasal examination shows normal mucosa, septum, turbinates.  Face: Facial examination shows no asymmetry. Palpation of the face elicit no significant tenderness.  Mouth: Oral cavity examination shows no mucosal lacerations. No significant trismus is noted.  Neck: Palpation of the neck reveals no lymphadenopathy or mass. The trachea is midline. The thyroid is not significantly enlarged. No stridor. Neuro: Cranial nerves 2-12 are all grossly in tact.  Recent Labs    04/07/19 0910 04/08/19 0559  WBC 6.8 8.3  HGB 13.0 11.4*  HCT 39.8 34.6*  PLT 258 234   Recent Labs    04/06/19 2150 04/06/19 2150 04/07/19 0910 04/07/19 1229  NA 134*  --  138  --   K 4.5  --  4.4  --   CL 96*  --  104  --   CO2 24  --  23  --   GLUCOSE 342*   < > 304* 733*  BUN 16  --  18  --   CREATININE 0.98  --  0.91  --   CALCIUM 9.6  --  8.6*  --    < > = values in this interval not displayed.    Medications:  I have reviewed the patient's current medications. Scheduled: . dexamethasone (DECADRON) injection  10 mg Intravenous Daily  . enoxaparin (LOVENOX) injection  40 mg Subcutaneous Q24H  . folic acid  1 mg Intravenous Daily  . insulin aspart  0-9 Units Subcutaneous Q4H  . insulin glargine  20 Units Subcutaneous Daily  . LORazepam  2 mg Intravenous TID  . thiamine  100 mg Oral Daily   Or  . thiamine  100 mg Intravenous Daily   Continuous: . dextrose 5 % and  0.45% NaCl 75 mL/hr at 04/08/19 0735   KY[facility]:[id number], LORazepam, LORazepam **OR** LORazepam  Assessment/Plan: Mild angioedema without evidence of epiglottitis. It maybe a result of his previous use of lisinopril. - Not symptomatic since admission. - Currently in withdrawal from polysubstance abuse. - No ENT concern at this time. - Avoid all ACE inhibitor. - Will signoff. Please call with questions.   LOS: 1 day   Lumen Brinlee W Rivka Baune 04/08/2019, 7:40 AM

## 2019-04-08 NOTE — Progress Notes (Signed)
Inpatient Diabetes Program Recommendations  AACE/ADA: New Consensus Statement on Inpatient Glycemic Control (2015)  Target Ranges:  Prepandial:   less than 140 mg/dL      Peak postprandial:   less than 180 mg/dL (1-2 hours)      Critically ill patients:  140 - 180 mg/dL   Lab Results  Component Value Date   GLUCAP 261 (H) 04/08/2019   HGBA1C 14.3 (H) 04/07/2019    Review of Glycemic Control Results for TATE, ZAGAL (MRN [id number]) as of 04/08/2019 13:45  Ref. Range 04/08/2019 04:09 04/08/2019 07:56 04/08/2019 12:30  Glucose-Capillary Latest Ref Range: 70 - 99 mg/dL 458 (H) 592 (H) 924 (H)   Diabetes history: Type 2 DM Outpatient Diabetes medications: Glipizide 10 mg BID, Metformin 500 mg BID, Lantus 40 units QHS Current orders for Inpatient glycemic control: Lantus 20 units QD, Novolog 5 units TID Prednisone 40 mg QAM  Inpatient Diabetes Program Recommendations:    Noted consult. Attempted to speak with patient at bedside. Patient verifies home medications, however, cannot relay dosages. Get medications and supplies from [place]. Reviewed patient's current A1c of 14.3%. Explained what a A1c is and what it measures. Also reviewed goal A1c with patient, importance of good glucose control @ home, and blood sugar goals. Throughout encounter, patient slurring speech, withdrawing from substances and falling asleep. Patient not appropriate for education at this time.  Thanks, Lujean Rave, MSN, RNC-OB Diabetes Coordinator [phone] (8a-5p)

## 2019-04-08 NOTE — Discharge Summary (Signed)
Physician Discharge Summary  William Acevedo CBS:[id number] DOB: 1955-02-19 DOA: 04/06/2019  PCP: William Kluver, MD  Admit date: 04/06/2019 Discharge date: 04/08/2019  Time spent: 45 minutes  Recommendations for Outpatient Follow-up:   Epiglottitis -Negative stridor on room air.  Most likely secondary to patient consuming 2 g of Crack Cocaine   -Negative fever, leukocytosis, procalcitonin. -All antibiotics discontinued. -3/22 change Decadron 10 mg daily to Prednisone 40 mg daily.  3-day course. -Per ENT avoid all ACE inhibitors. -Follow-up in 1 to 2 weeks with Dr. Alonna Acevedo epiglottitis, EtOH abuse, cocaine abuse, MDD, diabetes type 2 uncontrolled with complication, HLD,  Acute Encephalopathy  -Most likely secondary to consuming 2 g crack cocaine -Resolved  EtOH abuse -EtOH level<10 on admission  Polysubstance abuse/psychotic behavior -3/21 urine tox screen positive Benzodiazepine, Cocaine -Per patient has been using cocaine as well as other illicit drugs for 40 years HAS NO DESIRE FOR HELP -3/22 per patient swallowed 2 g crack cocaine in order to prevent arrest.  EMS called and transported to hospital. -3/22 per psychiatry start Prozac 20 mg daily -3/22 gabapentin 100 mg BID; however will restart patient's home dose of TID  Cocaine use disorder, severe, dependence -Recommend gabapentin 100 mg twice daily; will restart his home dose of gabapentin 100 mgTID -Recommend rehab, patient did not seem interested in the process, will ask social work to present options for him.  PATIENT NOT INTERESTED  Major depressive disorder recurrent mild: -Prozac 20 mg daily  Borderline QT interval prolongation -3/21 EKG pending  Poor PO intake -3/22 improved today.  DM type II uncontrolled with complication/HHNS -3/21 start patient on Endo tool -3/21 hemoglobin A1c= 14.3 -3/22 diabetic coordinator; uncontrolled diabetic -3/22 diabetic nutrition; uncontrolled  diabetic -Provide glucometer and supplies -Restart home insulin dose -Restart home diabetic medication regimen -PCP to make adjustments  HLD -3/21 LDL = 115 -3/22 Lipitor 20 mg daily  Hypokalemia -Potassium goal> 4 -K-Dur 50 mEq prior to discharge  Not competent to make medical decisions -Spoke with RN William Acevedo patient becoming combative not oriented to time, elevated CBG 499.  Appears patient combination of HHNS and cocaine withdrawal.  Not competent to make medical decisions -3/21 IVC patient. -3/22 IVC withdrawn as cocaine has cleared patient system and a change in as has resolved believe patient now competent to make his own decisions.      Discharge Diagnoses:  Active Problems:   MDD (major depressive disorder), recurrent episode, mild (HCC)   Uncomplicated alcohol dependence (HCC)   Overdose   Polysubstance abuse (HCC)   DM2 (diabetes mellitus, type 2) (HCC)   Cocaine use   Acute encephalopathy   Cocaine use disorder, severe, dependence (HCC)   Major depressive disorder   Discharge Condition: Stable  Diet recommendation: Regular  Filed Weights   04/06/19 2024 04/08/19 0550  Weight: 54.4 kg 52.5 kg    History of present illness:  64 y.o.BM PMHx DM type II uncontrolled with complication, EtOH abuse, Polysubstance abuse,   Brought in by EMS at the request of his brother for assistance with substance abuse. Patient with AMS on arrival and subsequently found to have epiglottitis. ENT consulted and recommended admission.   History limited as patient acutely altered and groggy during evaluation. History following mostly obtained from patient's brother. Patient only reports during evaluation that his throat has been bothering him for the last two weeks, unable to provide further history. Patient's brother William Acevedo was contacted by phone and reports that William Acevedo has a 40 year history of  alcohol and drug abuse. He lives with him. William Acevedo reported that [name] has not eaten  for the last 3-4 days and he has been using crack cocaine. He reports that his brother expressed desire to "get help" and had AMS and therefore he called EMS to bring him to the hospital.   In the ED: BP initially very elevated and then normal range over time. Tachycardic on presentation with temp to 99.17F. Mild tachypnea on presentation but maintaining O2 sats at 99-100%. Labs remarkable for Na 134, Glucose 342, LFT's WNL. Ethanol level < 10, CBC WNL. Tylenol and Salicylate levels WNL. Trop, Group A Strep, Lactate WNL. UDS pending. Blood cx ordered. CT Head: No acute abnormalities.  Patient with stridor and CT Neck done: IMPRESSION: 1. Findings suggestive of acute epiglottitis/supraglottitis as above. Airway remains patent at this time. 2. No other acute abnormality within the neck. 3. Moderate cervical spondylosis at C5-6 and C6-7. 4. Aortic Atherosclerosis (ICD10-I70.0). ENT was consulted by ED who recommended against intubation at this time and recommended admission.  Patient was given Decadron, Clindamycin, Ativan and 1L NS.  Hospital Course:  3/22 A/O x4.  Much less belligerent today.  Was able to relate actual story of what occurred.  States police pulled up and he ate 2 g of crack cocaine in order to prevent himself from being arrested.  At which point EMS was called and patient transported to hospital.  States is interested in speaking with Psychiatry/LCSW concerning help with his cocaine habit.  Patient spoke with psychiatrist and LCSW was offered inpatient services refused.  Psychiatry stated patient is not dangerous to himself or others.  Requested to be discharged   Consultations: ENT Psychiatry    Discharge Exam: Vitals:   04/08/19 0730 04/08/19 0731 04/08/19 0909 04/08/19 0910  BP: 120/87 120/87  111/72  Pulse:  84 86   Resp:  18    Temp:  97.7 F (36.5 C)    TempSrc:  Oral    SpO2:  96%    Weight:      Height:         General: A/O x4, no acute respiratory  distress, cachectic Eyes: negative scleral hemorrhage, negative anisocoria, negative icterus ENT: Negative Runny nose, negative gingival bleeding, poor dentition Neck:  Negative scars, masses, torticollis, lymphadenopathy, JVD Lungs: Clear to auscultation bilaterally without wheezes or crackles Cardiovascular: Regular rate and rhythm without murmur gallop or rub normal S1 and S2   Discharge Instructions   Allergies as of 04/08/2019      Reactions   Lisinopril Swelling   angioedema      Medication List    STOP taking these medications   amitriptyline 10 MG tablet Commonly known as: ELAVIL   insulin glargine 100 UNIT/ML injection Commonly known as: LANTUS Replaced by: insulin glargine 100 unit/mL Sopn   lisinopril 5 MG tablet Commonly known as: ZESTRIL   sertraline 100 MG tablet Commonly known as: ZOLOFT     TAKE these medications   atorvastatin 20 MG tablet Commonly known as: LIPITOR Take 20 mg by mouth at bedtime.   cyclobenzaprine 10 MG tablet Commonly known as: FLEXERIL Take 1 tablet (10 mg total) by mouth 2 (two) times daily as needed for muscle spasms.   FLUoxetine 20 MG capsule Commonly known as: PROZAC Take 1 capsule (20 mg total) by mouth daily. Start taking on: April 09, 2019   folic acid 1 MG tablet Commonly known as: FOLVITE Take 1 tablet (1 mg total) by mouth daily.  gabapentin 100 MG capsule Commonly known as: NEURONTIN Take 1 capsule (100 mg total) by mouth 3 (three) times daily. What changed: Another medication with the same name was removed. Continue taking this medication, and follow the directions you see here.   glipiZIDE 10 MG tablet Commonly known as: GLUCOTROL Take 10 mg by mouth 2 (two) times daily before a meal.   ibuprofen 800 MG tablet Commonly known as: ADVIL Take 1 tablet (800 mg total) by mouth every 8 (eight) hours as needed.   insulin glargine 100 unit/mL Sopn Commonly known as: LANTUS Inject 0.1 mLs (10 Units total)  into the skin daily. Replaces: insulin glargine 100 UNIT/ML injection   Insulin Pen Needle 29G X 12MM Misc Per instructions   ketoconazole 2 % cream Commonly known as: NIZORAL Apply 1 fingertip amount to each foot daily.   metFORMIN 500 MG tablet Commonly known as: GLUCOPHAGE Take 500 mg by mouth 2 (two) times daily with a meal.   NovoLOG FlexPen 100 UNIT/ML FlexPen Generic drug: insulin aspart Inject 5 Units into the skin 3 (three) times daily with meals.   omeprazole 20 MG capsule Commonly known as: PRILOSEC Take 20 mg by mouth at bedtime.   predniSONE 20 MG tablet Commonly known as: DELTASONE Take 2 tablets (40 mg total) by mouth daily with breakfast. Start taking on: April 09, 2019   tadalafil 20 MG tablet Commonly known as: CIALIS Take 10 mg by mouth daily as needed for erectile dysfunction.   thiamine 100 MG tablet Take 1 tablet (100 mg total) by mouth daily. Start taking on: April 09, 2019   traMADol 50 MG tablet Commonly known as: ULTRAM Take 1 tablet (50 mg total) by mouth every 6 (six) hours as needed for severe pain.      Allergies  Allergen Reactions  . Lisinopril Swelling    angioedema      The results of significant diagnostics from this hospitalization (including imaging, microbiology, ancillary and laboratory) are listed below for reference.    Significant Diagnostic Studies: CT Head Wo Contrast  Result Date: 04/07/2019 CLINICAL DATA:  Altered mental status EXAM: CT HEAD WITHOUT CONTRAST TECHNIQUE: Contiguous axial images were obtained from the base of the skull through the vertex without intravenous contrast. COMPARISON:  01/18/2019 FINDINGS: Brain: No acute intracranial abnormality. Specifically, no hemorrhage, hydrocephalus, mass lesion, acute infarction, or significant intracranial injury. Mild chronic small vessel disease within the deep white matter. Vascular: No hyperdense vessel or unexpected calcification. Skull: No acute calvarial  abnormality. Sinuses/Orbits: Visualized paranasal sinuses and mastoids clear. Orbital soft tissues unremarkable. Other: None IMPRESSION: No acute intracranial abnormality. Mild chronic small vessel disease. Electronically Signed   By: Charlett NoseKevin  Dover M.D.   On: 04/07/2019 00:02   CT Soft Tissue Neck W Contrast  Result Date: 04/07/2019 CLINICAL DATA:  Initial evaluation for acute dysphagia. EXAM: CT NECK WITH CONTRAST TECHNIQUE: Multidetector CT imaging of the neck was performed using the standard protocol following the bolus administration of intravenous contrast. CONTRAST:  75mL OMNIPAQUE IOHEXOL 300 MG/ML  SOLN COMPARISON:  None. FINDINGS: Pharynx and larynx: Examination technically limited by motion artifact. Evaluation of the oral cavity limited by streak artifact from dental amalgam. No obvious mass lesion or loculated collection. No appreciable base of tongue lesion. No findings to suggest acute tonsillitis. Parapharyngeal fat maintained. Nasopharynx within normal limits. No retropharyngeal collection. The epiglottis is mildly thickened and edematous in appearance as are the aryepiglottic folds (series 7, image 84). Findings suggestive of acute epiglottitis/supraglottitis. Mild mucosal  edema and swelling seen along the left pharynx, consistent with associated pharyngitis. Left piriform sinus is effaced. No discrete fluid collections. Supraglottic airway remains patent at this time. Glottis itself relatively with within normal limits. Subglottic airway clear. Salivary glands: Salivary glands including the parotid and submandibular glands within normal limits. Thyroid: Thyroid within normal limits. Lymph nodes: No pathologically enlarged or discrete adenopathy seen within the neck. Vascular: Moderate atherosclerotic change about the aortic arch and origin of great vessels as well as about the carotid bifurcations. Scattered vascular calcifications noted within the carotid siphons. Limited intracranial:  Unremarkable. Visualized orbits: Unremarkable. Mastoids and visualized paranasal sinuses: Paranasal sinuses are clear. Mastoid air cells and middle ear cavities are well pneumatized and free of fluid. Skeleton: Moderate cervical spondylosis noted at C5-6 and C6-7. No discrete or worrisome osseous lesions. Upper chest: Visualized upper chest demonstrates no acute finding. Mild paraseptal emphysema noted at the lung apices. Other: None. IMPRESSION: 1. Findings suggestive of acute epiglottitis/supraglottitis as above. Airway remains patent at this time. 2. No other acute abnormality within the neck. 3. Moderate cervical spondylosis at C5-6 and C6-7. 4. Aortic Atherosclerosis (ICD10-I70.0). Electronically Signed   By: Rise Mu M.D.   On: 04/07/2019 00:29    Microbiology: Recent Results (from the past 240 hour(s))  Group A Strep by PCR     Status: None   Collection Time: 04/07/19 12:39 AM   Specimen: Throat; Sterile Swab  Result Value Ref Range Status   Group A Strep by PCR NOT DETECTED NOT DETECTED Final    Comment: Performed at [facility], [address] [place], [place] [address]  Respiratory Panel by RT PCR (Flu A&B, Covid) - Nasopharyngeal Swab     Status: None   Collection Time: 04/07/19 12:53 AM   Specimen: Nasopharyngeal Swab  Result Value Ref Range Status   SARS Coronavirus 2 by RT PCR NEGATIVE NEGATIVE Final    Comment: (NOTE) SARS-CoV-2 target nucleic acids are NOT DETECTED. The SARS-CoV-2 RNA is generally detectable in upper respiratoy specimens during the acute phase of infection. The lowest concentration of SARS-CoV-2 viral copies this assay can detect is 131 copies/mL. A negative result does not preclude SARS-Cov-2 infection and should not be used as the sole basis for treatment or other patient management decisions. A negative result may occur with  improper specimen collection/handling, submission of specimen other than nasopharyngeal swab, presence of viral  mutation(s) within the areas targeted by this assay, and inadequate number of viral copies (<131 copies/mL). A negative result must be combined with clinical observations, patient history, and epidemiological information. The expected result is Negative. Fact Sheet for Patients:  https://www.moore.com/ Fact Sheet for Healthcare Providers:  https://www.young.biz/ This test is not yet ap proved or cleared by the [place] FDA and  has been authorized for detection and/or diagnosis of SARS-CoV-2 by FDA under an Emergency Use Authorization (EUA). This EUA will remain  in effect (meaning this test can be used) for the duration of the COVID-19 declaration under Section 564(b)(1) of the Act, 21 U.S.C. section 360bbb-3(b)(1), unless the authorization is terminated or revoked sooner.    Influenza A by PCR NEGATIVE NEGATIVE Final   Influenza B by PCR NEGATIVE NEGATIVE Final    Comment: (NOTE) The Xpert Xpress SARS-CoV-2/FLU/RSV assay is intended as an aid in  the diagnosis of influenza from Nasopharyngeal swab specimens and  should not be used as a sole basis for treatment. Nasal washings and  aspirates are unacceptable for Xpert Xpress SARS-CoV-2/FLU/RSV  testing. Fact Sheet for Patients: https://www.moore.com/ Fact Sheet for Healthcare Providers: https://www.young.biz/ This test is not yet approved or cleared by the [place] FDA and  has been authorized for detection and/or diagnosis of SARS-CoV-2 by  FDA under an Emergency Use Authorization (EUA). This EUA will remain  in effect (meaning this test can be used) for the duration of the  Covid-19 declaration under Section 564(b)(1) of the Act, 21  U.S.C. section 360bbb-3(b)(1), unless the authorization is  terminated or revoked. Performed at [facility], [address] [place], [place] [address]   Blood culture (routine x 2)     Status: None  (Preliminary result)   Collection Time: 04/07/19 12:56 AM   Specimen: BLOOD RIGHT FOREARM  Result Value Ref Range Status   Specimen Description BLOOD RIGHT FOREARM  Final   Special Requests   Final    BOTTLES DRAWN AEROBIC AND ANAEROBIC Blood Culture results may not be optimal due to an inadequate volume of blood received in culture bottles   Culture   Final    NO GROWTH 1 DAY Performed at [facility], [address] [place], [place] [address]    Report Status PENDING  Incomplete  Blood culture (routine x 2)     Status: None (Preliminary result)   Collection Time: 04/07/19 12:56 AM   Specimen: BLOOD RIGHT FOREARM  Result Value Ref Range Status   Specimen Description BLOOD RIGHT FOREARM  Final   Special Requests   Final    BOTTLES DRAWN AEROBIC AND ANAEROBIC Blood Culture adequate volume   Culture   Final    NO GROWTH 1 DAY Performed at [facility], [address] [place], [place] [address]    Report Status PENDING  Incomplete     Labs: Basic Metabolic Panel: Recent Labs  Lab 04/06/19 2150 04/07/19 0235 04/07/19 0910 04/07/19 1229 04/08/19 0559  NA 134*  --  138  --  139  K 4.5  --  4.4  --  3.5  CL 96*  --  104  --  103  CO2 24  --  23  --  26  GLUCOSE 342*  --  304* 733* 157*  BUN 16  --  18  --  18  CREATININE 0.98  --  0.91  --  0.80  CALCIUM 9.6  --  8.6*  --  8.3*  MG  --  1.9 2.3  --  2.1  PHOS  --  3.7 4.9*  --  3.4   Liver Function Tests: Recent Labs  Lab 04/06/19 2150 04/07/19 0910 04/08/19 0559  AST 20 15 18   ALT 15 14 11   ALKPHOS 75 58 44  BILITOT 0.7 0.9 0.5  PROT 8.4* 7.0 5.8*  ALBUMIN 4.3 3.3* 2.8*   No results for input(s): LIPASE, AMYLASE in the last 168 hours. No results for input(s): AMMONIA in the last 168 hours. CBC: Recent Labs  Lab 04/06/19 2150 04/07/19 0910 04/08/19 0559  WBC 10.4 6.8 8.3  NEUTROABS 8.0* 5.9  --   HGB 15.3 13.0 11.4*  HCT 46.5 39.8 34.6*  MCV 88.9 88.1 88.9  PLT 297 258 234   Cardiac  Enzymes: No results for input(s): CKTOTAL, CKMB, CKMBINDEX, TROPONINI in the last 168 hours. BNP: BNP (last 3 results) No results for input(s): BNP in the last 8760 hours.  ProBNP (last 3 results) No results for input(s): PROBNP in the last 8760 hours.  CBG: Recent Labs  Lab 04/08/19  0209 04/08/19 0324 04/08/19 0409 04/08/19 0756 04/08/19 1230  GLUCAP 92 100* 108* 112* 261*       Signed:  Dia Crawford, MD [facility] [phone] pager

## 2019-04-12 LAB — CULTURE, BLOOD (ROUTINE X 2)
Culture: NO GROWTH
Culture: NO GROWTH
Special Requests: ADEQUATE

## 2019-07-10 ENCOUNTER — Other Ambulatory Visit: Payer: Self-pay

## 2019-07-10 ENCOUNTER — Emergency Department (HOSPITAL_COMMUNITY)
Admission: EM | Admit: 2019-07-10 | Discharge: 2019-07-11 | Disposition: A | Discharge status: Home / Self Care | Payer: No Typology Code available for payment source | Attending: Emergency Medicine | Admitting: Emergency Medicine

## 2019-07-10 ENCOUNTER — Encounter (HOSPITAL_COMMUNITY): Payer: Self-pay | Admitting: Emergency Medicine

## 2019-07-10 DIAGNOSIS — K0889 Other specified disorders of teeth and supporting structures: Secondary | ICD-10-CM | POA: Insufficient documentation

## 2019-07-10 DIAGNOSIS — Z5321 Procedure and treatment not carried out due to patient leaving prior to being seen by health care provider: Secondary | ICD-10-CM | POA: Insufficient documentation

## 2019-07-10 NOTE — ED Triage Notes (Signed)
Patient reports right upper dental pan today .

## 2019-07-11 NOTE — ED Notes (Signed)
Pt called 3x for vitals with no response 

## 2019-07-11 NOTE — ED Notes (Signed)
Pt called for vitals 3x with no response

## 2019-08-23 ENCOUNTER — Encounter (HOSPITAL_COMMUNITY): Payer: Self-pay

## 2019-08-23 ENCOUNTER — Emergency Department (HOSPITAL_COMMUNITY)
Admission: EM | Admit: 2019-08-23 | Discharge: 2019-08-24 | Disposition: A | Discharge status: Home / Self Care | Payer: No Typology Code available for payment source | Attending: Emergency Medicine | Admitting: Emergency Medicine

## 2019-08-23 ENCOUNTER — Other Ambulatory Visit: Payer: Self-pay

## 2019-08-23 DIAGNOSIS — R739 Hyperglycemia, unspecified: Secondary | ICD-10-CM | POA: Diagnosis present

## 2019-08-23 DIAGNOSIS — Z91199 Patient's noncompliance with other medical treatment and regimen due to unspecified reason: Secondary | ICD-10-CM

## 2019-08-23 DIAGNOSIS — Z9119 Patient's noncompliance with other medical treatment and regimen: Secondary | ICD-10-CM | POA: Diagnosis not present

## 2019-08-23 DIAGNOSIS — E1165 Type 2 diabetes mellitus with hyperglycemia: Secondary | ICD-10-CM | POA: Diagnosis not present

## 2019-08-23 DIAGNOSIS — Z7984 Long term (current) use of oral hypoglycemic drugs: Secondary | ICD-10-CM | POA: Diagnosis not present

## 2019-08-23 DIAGNOSIS — F17210 Nicotine dependence, cigarettes, uncomplicated: Secondary | ICD-10-CM | POA: Diagnosis not present

## 2019-08-23 DIAGNOSIS — E1140 Type 2 diabetes mellitus with diabetic neuropathy, unspecified: Secondary | ICD-10-CM | POA: Insufficient documentation

## 2019-08-23 LAB — CBG MONITORING, ED
Glucose-Capillary: 514 mg/dL (ref 70–99)
Glucose-Capillary: >600 mg/dL (ref 70–99)

## 2019-08-23 LAB — CBC
HCT: 39.4 % (ref 39.0–52.0)
Hemoglobin: 12.9 g/dL — ABNORMAL LOW (ref 13.0–17.0)
MCH: 28.1 pg (ref 26.0–34.0)
MCHC: 32.7 g/dL (ref 30.0–36.0)
MCV: 85.8 fL (ref 80.0–100.0)
Platelets: 291 10*3/uL (ref 150–400)
RBC: 4.59 MIL/uL (ref 4.22–5.81)
RDW: 11.9 % (ref 11.5–15.5)
WBC: 6.9 10*3/uL (ref 4.0–10.5)
nRBC: 0 % (ref 0.0–0.2)

## 2019-08-23 LAB — URINALYSIS, ROUTINE W REFLEX MICROSCOPIC
Bacteria, UA: NONE SEEN
Bilirubin Urine: NEGATIVE
Glucose, UA: >=500 mg/dL — AB
Hgb urine dipstick: NEGATIVE
Ketones, ur: NEGATIVE mg/dL
Leukocytes,Ua: NEGATIVE
Nitrite: NEGATIVE
Protein, ur: NEGATIVE mg/dL
Specific Gravity, Urine: 1.028 (ref 1.005–1.030)
pH: 5 (ref 5.0–8.0)

## 2019-08-23 LAB — BASIC METABOLIC PANEL
Anion gap: 13 (ref 5–15)
BUN: 8 mg/dL (ref 8–23)
CO2: 22 mmol/L (ref 22–32)
Calcium: 9.3 mg/dL (ref 8.9–10.3)
Chloride: 92 mmol/L — ABNORMAL LOW (ref 98–111)
Creatinine, Ser: 0.86 mg/dL (ref 0.61–1.24)
GFR calc Af Amer: >60 mL/min (ref >60)
GFR calc non Af Amer: >60 mL/min (ref >60)
Glucose, Bld: 533 mg/dL (ref 70–99)
Potassium: 4.2 mmol/L (ref 3.5–5.1)
Sodium: 127 mmol/L — ABNORMAL LOW (ref 135–145)

## 2019-08-23 MED ORDER — DEXTROSE-NACL 5-0.45 % IV SOLN: INTRAVENOUS | Status: DC

## 2019-08-23 MED ORDER — SODIUM CHLORIDE 0.9 % IV SOLN: INTRAVENOUS | Status: DC

## 2019-08-23 MED ORDER — INSULIN REGULAR(HUMAN) IN NACL 100-0.9 UT/100ML-% IV SOLN
INTRAVENOUS | Status: DC
  Administered 2019-08-24: 11 [IU]/h via INTRAVENOUS
  Filled 2019-08-23: qty 100

## 2019-08-23 MED ORDER — DEXTROSE 50 % IV SOLN: 0.0000 mL | INTRAVENOUS | Status: DC | PRN

## 2019-08-23 NOTE — ED Notes (Signed)
Pt noted to be eating honey bun in lobby

## 2019-08-23 NOTE — ED Notes (Signed)
SUGAR 533 - also told patient to stop eating honey bun multiple times, but pt has refused

## 2019-08-23 NOTE — ED Provider Notes (Signed)
[facility] EMERGENCY DEPARTMENT Provider Note   CSN: [id number] Arrival date & time: 08/23/19  1528     History Chief Complaint  Patient presents with  . Hyperglycemia    William Acevedo is a 64 y.o. male with a past medical history of cocaine use, polysubstance abuse, DM 2, alcohol use, who presents today for evaluation of hyperglycemia.  He states that he has not been taking his hyperglycemia medicines in about 4 months.  He states that this is because he is currently having housing difficulties and states he is unable to eat a well-balanced diet.  He states he went to his [place] doctor today, and they referred him here.  He was noted to be eating honey buns in the waiting room.  He reports he has mild pain in his mouth after getting all of his teeth pulled however denies any fevers or other complaints or concerns.  He states that if he does not eat then his sugar drops really low.  He denies any fevers.    HPI     Past Medical History:  Diagnosis Date  . Carpal tunnel syndrome   . Depression   . Diabetes mellitus without complication ([place])   . Neuropathy     Patient Active Problem List   Diagnosis Date Noted  . Cocaine use disorder, severe, dependence ([place]) 04/08/2019  . Major depressive disorder 04/08/2019  . Epiglottitis 04/07/2019  . Cocaine use 04/07/2019  . Acute encephalopathy 04/07/2019  . Overdose 07/06/2015  . AKI (acute kidney injury) ([place]) 07/06/2015  . Polysubstance abuse ([place]) 07/06/2015  . DM2 (diabetes mellitus, type 2) ([place]) 07/06/2015  . Dehydration   . MDD (major depressive disorder), recurrent episode, mild ([place])   . Uncomplicated alcohol dependence ([place])     Past Surgical History:  Procedure Laterality Date  . TESTICLE TORSION REDUCTION         No family history on file.  Social History   Tobacco Use  . Smoking status: Current Every Day Smoker    Packs/day: 0.50    Types: Cigarettes  . Smokeless tobacco: Never  Used  Substance Use Topics  . Alcohol use: Yes  . Drug use: Yes    Types: Cocaine    Home Medications Prior to Admission medications   Medication Sig Start Date End Date Taking? Authorizing Provider  blood glucose meter kit and supplies KIT Dispense based on patient and insurance preference. Use up to four times daily as directed. (FOR ICD-9 250.00, 250.01). 04/08/19   [name], MD  cyclobenzaprine (FLEXERIL) 10 MG tablet Take 1 tablet (10 mg total) by mouth 2 (two) times daily as needed for muscle spasms. Patient not taking: Reported on 04/07/2019 01/18/19   Tegeler, Gwenyth Allegra, MD  FLUoxetine (PROZAC) 20 MG capsule Take 1 capsule (20 mg total) by mouth daily. Patient not taking: Reported on 08/24/2019 04/09/19   [name], MD  folic acid (FOLVITE) 1 MG tablet Take 1 tablet (1 mg total) by mouth daily. Patient not taking: Reported on 08/24/2019 04/08/19   [name], MD  gabapentin (NEURONTIN) 100 MG capsule Take 1 capsule (100 mg total) by mouth 3 (three) times daily. Patient not taking: Reported on 04/07/2019 12/02/13   Muthersbaugh, [name], PA-C  glipiZIDE (GLUCOTROL) 10 MG tablet Take 1 tablet (10 mg total) by mouth daily before breakfast. 08/24/19   [name], PA-C  ibuprofen (ADVIL) 800 MG tablet Take 1 tablet (800 mg total) by mouth every 8 (  eight) hours as needed. Patient not taking: Reported on 04/07/2019 09/21/18   [name], PA-C  insulin aspart (NOVOLOG FLEXPEN) 100 UNIT/ML FlexPen Inject 5 Units into the skin 3 (three) times daily with meals. Patient not taking: Reported on 08/24/2019 04/08/19   [name], MD  insulin glargine (LANTUS) 100 UNIT/ML Solostar Pen Inject 40 Units into the skin daily. 08/24/19   [name], PA-C  Insulin Pen Needle 29G X 12MM MISC Per instructions 04/08/19   [name], MD  ketoconazole (NIZORAL) 2 % cream Apply 1 fingertip amount to each foot daily. Patient not taking: Reported on 09/21/2018 08/10/17   [name], DPM  predniSONE (DELTASONE) 20 MG tablet Take 2 tablets (40 mg total) by mouth daily with breakfast. Patient not taking: Reported on 08/24/2019 04/09/19   [name], MD  thiamine 100 MG tablet Take 1 tablet (100 mg total) by mouth daily. Patient not taking: Reported on 08/24/2019 04/09/19   [name], MD  traMADol (ULTRAM) 50 MG tablet Take 1 tablet (50 mg total) by mouth every 6 (six) hours as needed for severe pain. Patient not taking: Reported on 04/07/2019 09/21/18   [name], PA-C    Allergies    Lisinopril  Review of Systems   Review of Systems  Constitutional: Negative for chills and fever.  HENT: Negative for congestion.   Respiratory: Negative for cough and shortness of breath.   Gastrointestinal: Negative for abdominal pain, nausea and vomiting.  Endocrine: Positive for polydipsia and polyuria.  Neurological: Negative for weakness and headaches.  Psychiatric/Behavioral: Negative for confusion.  All other systems reviewed and are negative.   Physical Exam Updated Vital Signs BP 121/66 (BP Location: Right Arm)   Pulse 90   Temp 98.7 F (37.1 C) (Oral)   Resp 18   Ht 5' 5"  (1.651 m)   Wt 52.2 kg   SpO2 100%   BMI 19.14 kg/m   Physical Exam Vitals and nursing note reviewed.  Constitutional:      Appearance: He is well-developed. He is not ill-appearing.  HENT:     Head: Normocephalic and atraumatic.     Mouth/Throat:     Mouth: Mucous membranes are moist.  Eyes:     Conjunctiva/sclera: Conjunctivae normal.  Cardiovascular:     Rate and Rhythm: Normal rate and regular rhythm.     Heart sounds: No murmur heard.   Pulmonary:     Effort: Pulmonary effort is normal. No respiratory distress.     Breath sounds: Normal breath sounds.  Abdominal:     General: There is no distension.     Palpations: Abdomen is soft.     Tenderness: There is no abdominal tenderness.  Musculoskeletal:     Cervical back: Neck supple.     Right lower leg:  No edema.     Left lower leg: No edema.  Skin:    General: Skin is warm and dry.  Neurological:     General: No focal deficit present.     Mental Status: He is alert.     Cranial Nerves: No cranial nerve deficit.  Psychiatric:        Mood and Affect: Mood normal.        Behavior: Behavior normal.     ED Results / Procedures / Treatments   Labs (all labs ordered are listed, but only abnormal results are displayed) Labs Reviewed  BASIC METABOLIC PANEL - Abnormal; Notable for the following components:  Result Value   Sodium 127 (*)    Chloride 92 (*)    Glucose, Bld 533 (*)    All other components within normal limits  CBC - Abnormal; Notable for the following components:   Hemoglobin 12.9 (*)    All other components within normal limits  URINALYSIS, ROUTINE W REFLEX MICROSCOPIC - Abnormal; Notable for the following components:   Color, Urine STRAW (*)    Glucose, UA >=500 (*)    All other components within normal limits  CBG MONITORING, ED - Abnormal; Notable for the following components:   Glucose-Capillary 514 (*)    All other components within normal limits  CBG MONITORING, ED - Abnormal; Notable for the following components:   Glucose-Capillary >600 (*)    All other components within normal limits  CBG MONITORING, ED - Abnormal; Notable for the following components:   Glucose-Capillary 406 (*)    All other components within normal limits  CBG MONITORING, ED - Abnormal; Notable for the following components:   Glucose-Capillary 249 (*)    All other components within normal limits  CBG MONITORING, ED - Abnormal; Notable for the following components:   Glucose-Capillary 133 (*)    All other components within normal limits  ETHANOL    EKG EKG Interpretation  Date/Time:  Saturday August 24 2019 01:37:23 EDT Ventricular Rate:  93 PR Interval:    QRS Duration: 87 QT Interval:  369 QTC Calculation: 459 R Axis:   71 Text Interpretation: Sinus rhythm Right atrial  enlargement Nonspecific T abnormalities, lateral leads No significant change since last tracing Confirmed by Ripley Fraise [phone]) on 08/24/2019 1:52:06 AM   Radiology No results found.  Procedures Procedures (including critical care time)  Medications Ordered in ED Medications  0.9 %  sodium chloride infusion (0 mLs Intravenous Stopped 08/24/19 0339)  gabapentin (NEURONTIN) capsule 300 mg (300 mg Oral Given 08/24/19 0124)    ED Course  I have reviewed the triage vital signs and the nursing notes.  Pertinent labs & imaging results that were available during my care of the patient were reviewed by me and considered in my medical decision making (see chart for details).  Clinical Course as of Aug 24 618  Sat Aug 24, 2019  0128 Was informed patient is having leg cramping.  Ordered his home gabapentin.    [EH]  0211 Patient got about 10 units of insulin and a liter of fluids through endotool, CBG is now 249.  Discontinue Endotool.     [EH]    Clinical Course User Index [EH] Ollen Gross   MDM Rules/Calculators/A&P                         Patient is a 65 year old man who presents today for evaluation of hyperglycemia. He reports noncompliance with his diabetes medications and diet. He was noted to be eating sugary snacks while in the waiting room.  Labs are obtained and reviewed, he is hyperglycemic. His CO2 is normal, and his UA does not have ketones. No evidence of DKA. He is overall generally well-appearing. His sugar elevated significantly after he had been eating sugary snacks in the waiting room. He is started on Endo tool, given IV insulin and IV fluids. After this his blood sugar improved down to 113. He is afebrile, not tachycardic or tachypneic. Due to his stopped and he is allowed to eat.  Patient's home diabetes medications are ordered.  He has, prior to today,  been seen most recently in the [facility] system and there listed medications correspond  with what he reports he is taking.  His medications are prescribed in accordance with this.  We discussed the importance of maintaining compliance with his medications and eating a low-carb/low sugar diet. He states his understanding. He is instructed to follow-up with his primary care doctor for refills on the medications.  Return precautions were discussed with patient who states their understanding.  At the time of discharge patient denied any unaddressed complaints or concerns.  Patient is agreeable for discharge home.  Note: Portions of this report may have been transcribed using voice recognition software. Every effort was made to ensure accuracy; however, inadvertent computerized transcription errors may be present   Final Clinical Impression(s) / ED Diagnoses Final diagnoses:  Hyperglycemia  Patient's noncompliance with other medical treatment and regimen    Rx / DC Orders ED Discharge Orders         Ordered    insulin glargine (LANTUS) 100 UNIT/ML Solostar Pen  Daily     Discontinue  Reprint     08/24/19 0242    glipiZIDE (GLUCOTROL) 10 MG tablet  Daily before breakfast     Discontinue  Reprint     08/24/19 0242           [name], PA-C 08/24/19 Pecola Lawless    Ripley Fraise, MD 08/24/19 0630

## 2019-08-23 NOTE — ED Triage Notes (Signed)
Pt arrives to ED w/ c/o hyperglycemia. Pt states CBG 400 this morning. Pt states he has not been taking his diabetes medicine. Pt noted to be holding honey bun in triage, instructed not to eat it.

## 2019-08-24 LAB — CBG MONITORING, ED
Glucose-Capillary: 406 mg/dL — ABNORMAL HIGH (ref 70–99)
Glucose-Capillary: 249 mg/dL — ABNORMAL HIGH (ref 70–99)
Glucose-Capillary: 133 mg/dL — ABNORMAL HIGH (ref 70–99)

## 2019-08-24 LAB — ETHANOL: Alcohol, Ethyl (B): <10 mg/dL (ref <10)

## 2019-08-24 MED ORDER — INSULIN GLARGINE 100 UNIT/ML SOLOSTAR PEN
40.0000 [IU] | PEN_INJECTOR | Freq: Every day | SUBCUTANEOUS | 0 refills | Status: DC
Start: 2019-08-24 — End: 2020-04-19

## 2019-08-24 MED ORDER — GLIPIZIDE 10 MG PO TABS
10.0000 mg | ORAL_TABLET | Freq: Every day | ORAL | 0 refills | Status: DC
Start: 2019-08-24 — End: 2020-07-13

## 2019-08-24 MED ORDER — GABAPENTIN 300 MG PO CAPS
300.0000 mg | ORAL_CAPSULE | Freq: Once | ORAL | Status: AC
  Administered 2019-08-24: 300 mg via ORAL
  Filled 2019-08-24: qty 1

## 2019-08-24 NOTE — ED Notes (Signed)
Leg cramps  neurontin given for the cramps

## 2019-08-24 NOTE — ED Provider Notes (Signed)
Patient seen/examined in the Emergency Department in conjunction with Advanced Practice Provider [facility] Patient reports hyperglycemia.  He has been eating honey buns Exam : awake/alert, no distress, conversant and appropriate Plan: pt with hyperglycemia without DKA Will give IV fluids/insulin Anticipate discharge    Zadie Rhine, MD 08/24/19 0030

## 2019-08-24 NOTE — ED Notes (Signed)
The pa has stopped the insulin drip and the d51/2 nss

## 2019-08-24 NOTE — Discharge Instructions (Signed)
It is important that you are taking your medications for diabetes and eating healthy.  Please try to limit the amount of sugar that you eat.  Please schedule a follow-up appointment with your primary care doctor. Please note that if you take the glipizide or insulin and do not eat this can cause your sugar to go very low which can be dangerous.  Please make sure you are eating healthy well-balanced meals while taking these medications.

## 2019-09-13 ENCOUNTER — Encounter (HOSPITAL_COMMUNITY): Payer: Self-pay | Admitting: Emergency Medicine

## 2019-09-13 ENCOUNTER — Emergency Department (HOSPITAL_COMMUNITY)
Admission: EM | Admit: 2019-09-13 | Discharge: 2019-09-13 | Disposition: A | Discharge status: Home / Self Care | Payer: Medicare Other | Attending: Emergency Medicine | Admitting: Emergency Medicine

## 2019-09-13 ENCOUNTER — Other Ambulatory Visit: Payer: Self-pay

## 2019-09-13 DIAGNOSIS — S30860A Insect bite (nonvenomous) of lower back and pelvis, initial encounter: Secondary | ICD-10-CM | POA: Insufficient documentation

## 2019-09-13 DIAGNOSIS — Z794 Long term (current) use of insulin: Secondary | ICD-10-CM | POA: Insufficient documentation

## 2019-09-13 DIAGNOSIS — E1165 Type 2 diabetes mellitus with hyperglycemia: Secondary | ICD-10-CM

## 2019-09-13 DIAGNOSIS — E119 Type 2 diabetes mellitus without complications: Secondary | ICD-10-CM | POA: Insufficient documentation

## 2019-09-13 DIAGNOSIS — Y998 Other external cause status: Secondary | ICD-10-CM | POA: Diagnosis not present

## 2019-09-13 DIAGNOSIS — F17210 Nicotine dependence, cigarettes, uncomplicated: Secondary | ICD-10-CM | POA: Diagnosis not present

## 2019-09-13 DIAGNOSIS — Y9389 Activity, other specified: Secondary | ICD-10-CM | POA: Insufficient documentation

## 2019-09-13 DIAGNOSIS — Y9289 Other specified places as the place of occurrence of the external cause: Secondary | ICD-10-CM | POA: Insufficient documentation

## 2019-09-13 DIAGNOSIS — S0006XA Insect bite (nonvenomous) of scalp, initial encounter: Secondary | ICD-10-CM | POA: Insufficient documentation

## 2019-09-13 DIAGNOSIS — W57XXXA Bitten or stung by nonvenomous insect and other nonvenomous arthropods, initial encounter: Secondary | ICD-10-CM | POA: Diagnosis not present

## 2019-09-13 LAB — URINALYSIS, ROUTINE W REFLEX MICROSCOPIC
Bacteria, UA: NONE SEEN
Bilirubin Urine: NEGATIVE
Glucose, UA: >=500 mg/dL — AB
Hgb urine dipstick: NEGATIVE
Ketones, ur: NEGATIVE mg/dL
Leukocytes,Ua: NEGATIVE
Nitrite: NEGATIVE
Protein, ur: 100 mg/dL — AB
Specific Gravity, Urine: 1.022 (ref 1.005–1.030)
pH: 5 (ref 5.0–8.0)

## 2019-09-13 LAB — BASIC METABOLIC PANEL
Anion gap: 13 (ref 5–15)
BUN: 16 mg/dL (ref 8–23)
CO2: 24 mmol/L (ref 22–32)
Calcium: 9.3 mg/dL (ref 8.9–10.3)
Chloride: 94 mmol/L — ABNORMAL LOW (ref 98–111)
Creatinine, Ser: 0.99 mg/dL (ref 0.61–1.24)
GFR calc Af Amer: >60 mL/min (ref >60)
GFR calc non Af Amer: >60 mL/min (ref >60)
Glucose, Bld: 296 mg/dL — ABNORMAL HIGH (ref 70–99)
Potassium: 4.2 mmol/L (ref 3.5–5.1)
Sodium: 131 mmol/L — ABNORMAL LOW (ref 135–145)

## 2019-09-13 LAB — CBC WITH DIFFERENTIAL/PLATELET
Abs Immature Granulocytes: 0.02 10*3/uL (ref 0.00–0.07)
Basophils Absolute: 0 10*3/uL (ref 0.0–0.1)
Basophils Relative: 0 %
Eosinophils Absolute: 0 10*3/uL (ref 0.0–0.5)
Eosinophils Relative: 0 %
HCT: 42 % (ref 39.0–52.0)
Hemoglobin: 13.8 g/dL (ref 13.0–17.0)
Immature Granulocytes: 0 %
Lymphocytes Relative: 36 %
Lymphs Abs: 2.6 10*3/uL (ref 0.7–4.0)
MCH: 28.8 pg (ref 26.0–34.0)
MCHC: 32.9 g/dL (ref 30.0–36.0)
MCV: 87.5 fL (ref 80.0–100.0)
Monocytes Absolute: 0.6 10*3/uL (ref 0.1–1.0)
Monocytes Relative: 8 %
Neutro Abs: 4.1 10*3/uL (ref 1.7–7.7)
Neutrophils Relative %: 56 %
Platelets: 286 10*3/uL (ref 150–400)
RBC: 4.8 MIL/uL (ref 4.22–5.81)
RDW: 12.3 % (ref 11.5–15.5)
WBC: 7.4 10*3/uL (ref 4.0–10.5)
nRBC: 0 % (ref 0.0–0.2)

## 2019-09-13 LAB — RAPID URINE DRUG SCREEN, HOSP PERFORMED
Amphetamines: NOT DETECTED
Barbiturates: NOT DETECTED
Benzodiazepines: NOT DETECTED
Cocaine: POSITIVE — AB
Opiates: NOT DETECTED
Tetrahydrocannabinol: NOT DETECTED

## 2019-09-13 LAB — ETHANOL: Alcohol, Ethyl (B): <10 mg/dL (ref <10)

## 2019-09-13 MED ORDER — BLOOD GLUCOSE MONITOR KIT: PACK | 0 refills | Status: DC

## 2019-09-13 MED ORDER — KETOROLAC TROMETHAMINE 30 MG/ML IJ SOLN
30.0000 mg | Freq: Once | INTRAMUSCULAR | Status: AC
  Administered 2019-09-13: 30 mg via INTRAVENOUS
  Filled 2019-09-13: qty 1

## 2019-09-13 MED ORDER — DIPHENHYDRAMINE HCL 25 MG PO CAPS
25.0000 mg | ORAL_CAPSULE | Freq: Once | ORAL | Status: AC
  Administered 2019-09-13: 25 mg via ORAL
  Filled 2019-09-13 (×2): qty 1

## 2019-09-13 MED ORDER — DIPHENHYDRAMINE HCL 50 MG/ML IJ SOLN
25.0000 mg | Freq: Once | INTRAMUSCULAR | Status: AC
  Administered 2019-09-13: 25 mg via INTRAVENOUS
  Filled 2019-09-13: qty 1

## 2019-09-13 MED ORDER — FAMOTIDINE IN NACL 20-0.9 MG/50ML-% IV SOLN
20.0000 mg | Freq: Once | INTRAVENOUS | Status: AC
  Administered 2019-09-13: 20 mg via INTRAVENOUS
  Filled 2019-09-13: qty 50

## 2019-09-13 MED ORDER — HYDROCORTISONE 1 % EX CREA
TOPICAL_CREAM | Freq: Four times a day (QID) | CUTANEOUS | Status: DC
  Filled 2019-09-13: qty 28

## 2019-09-13 MED ORDER — SODIUM CHLORIDE 0.9 % IV BOLUS
1000.0000 mL | Freq: Once | INTRAVENOUS | Status: AC
  Administered 2019-09-13: 1000 mL via INTRAVENOUS

## 2019-09-13 NOTE — ED Provider Notes (Signed)
[facility] EMERGENCY DEPARTMENT Provider Note   CSN: [id number] Arrival date & time: 09/13/19  1808     History Chief Complaint  Patient presents with  . Insect Bite    William Acevedo is a 64 y.o. male.  Pt presents to the ED today with insect stings.  Pt said he was stung several times pta.  He did not take anything at home for the stings.  While in triage, he wound intermittently hold his breath then hyperventilate.  Pt told the nurse he passed out, but was observed flailing around and throwing himself out of the wheelchair.  Pt admits to using cocaine pta.  He denies any cp, sob, difficulty swallowing or talking.  He has a hx of poorly controlled DM.  He does not have a monitor so does not know what his blood sugar has been running.        Past Medical History:  Diagnosis Date  . Carpal tunnel syndrome   . Depression   . Diabetes mellitus without complication ([place])   . Neuropathy     Patient Active Problem List   Diagnosis Date Noted  . Cocaine use disorder, severe, dependence ([place]) 04/08/2019  . Major depressive disorder 04/08/2019  . Epiglottitis 04/07/2019  . Cocaine use 04/07/2019  . Acute encephalopathy 04/07/2019  . Overdose 07/06/2015  . AKI (acute kidney injury) ([place]) 07/06/2015  . Polysubstance abuse ([place]) 07/06/2015  . DM2 (diabetes mellitus, type 2) ([place]) 07/06/2015  . Dehydration   . MDD (major depressive disorder), recurrent episode, mild ([place])   . Uncomplicated alcohol dependence ([place])     Past Surgical History:  Procedure Laterality Date  . TESTICLE TORSION REDUCTION         No family history on file.  Social History   Tobacco Use  . Smoking status: Current Every Day Smoker    Packs/day: 0.50    Types: Cigarettes  . Smokeless tobacco: Never Used  Substance Use Topics  . Alcohol use: Yes  . Drug use: Yes    Types: Cocaine    Home Medications Prior to Admission medications   Medication Sig Start Date End  Date Taking? Authorizing Provider  blood glucose meter kit and supplies KIT Dispense based on patient and insurance preference. Use up to four times daily as directed. (FOR ICD-9 250.00, 250.01). 09/13/19   Isla Pence, MD  cyclobenzaprine (FLEXERIL) 10 MG tablet Take 1 tablet (10 mg total) by mouth 2 (two) times daily as needed for muscle spasms. Patient not taking: Reported on 04/07/2019 01/18/19   Tegeler, Gwenyth Allegra, MD  FLUoxetine (PROZAC) 20 MG capsule Take 1 capsule (20 mg total) by mouth daily. Patient not taking: Reported on 08/24/2019 04/09/19   [name], MD  folic acid (FOLVITE) 1 MG tablet Take 1 tablet (1 mg total) by mouth daily. Patient not taking: Reported on 08/24/2019 04/08/19   [name], MD  gabapentin (NEURONTIN) 100 MG capsule Take 1 capsule (100 mg total) by mouth 3 (three) times daily. Patient not taking: Reported on 04/07/2019 12/02/13   Muthersbaugh, [name], PA-C  glipiZIDE (GLUCOTROL) 10 MG tablet Take 1 tablet (10 mg total) by mouth daily before breakfast. 08/24/19   [name], PA-C  ibuprofen (ADVIL) 800 MG tablet Take 1 tablet (800 mg total) by mouth every 8 (eight) hours as needed. Patient not taking: Reported on 04/07/2019 09/21/18   [name], PA-C  insulin aspart (NOVOLOG FLEXPEN) 100 UNIT/ML FlexPen Inject 5 Units into the  skin 3 (three) times daily with meals. Patient not taking: Reported on 08/24/2019 04/08/19   [name], MD  insulin glargine (LANTUS) 100 UNIT/ML Solostar Pen Inject 40 Units into the skin daily. 08/24/19   [name], PA-C  Insulin Pen Needle 29G X 12MM MISC Per instructions 04/08/19   [name], MD  ketoconazole (NIZORAL) 2 % cream Apply 1 fingertip amount to each foot daily. Patient not taking: Reported on 09/21/2018 08/10/17   [name], DPM  predniSONE (DELTASONE) 20 MG tablet Take 2 tablets (40 mg total) by mouth daily with breakfast. Patient not taking: Reported on 08/24/2019 04/09/19   [name], MD  thiamine 100 MG tablet Take 1 tablet (100 mg total) by mouth daily. Patient not taking: Reported on 08/24/2019 04/09/19   [name], MD  traMADol (ULTRAM) 50 MG tablet Take 1 tablet (50 mg total) by mouth every 6 (six) hours as needed for severe pain. Patient not taking: Reported on 04/07/2019 09/21/18   [name], PA-C    Allergies    Lisinopril  Review of Systems   Review of Systems  Skin:       Pain on back and posterior scalp from insect bites.    Physical Exam Updated Vital Signs BP (!) 160/103 (BP Location: Right Arm)   Pulse 83   Temp 98 F (36.7 C) (Oral)   Resp 20   SpO2 100%   Physical Exam Vitals and nursing note reviewed.  Constitutional:      Appearance: Normal appearance.  HENT:     Head: Normocephalic and atraumatic.     Right Ear: External ear normal.     Left Ear: External ear normal.     Nose: Nose normal.     Mouth/Throat:     Mouth: Mucous membranes are moist.     Pharynx: Oropharynx is clear.  Eyes:     Extraocular Movements: Extraocular movements intact.     Conjunctiva/sclera: Conjunctivae normal.     Pupils: Pupils are equal, round, and reactive to light.  Cardiovascular:     Rate and Rhythm: Normal rate and regular rhythm.     Pulses: Normal pulses.     Heart sounds: Normal heart sounds.  Pulmonary:     Effort: Pulmonary effort is normal.     Breath sounds: Normal breath sounds.  Abdominal:     General: Abdomen is flat. Bowel sounds are normal.     Palpations: Abdomen is soft.  Musculoskeletal:        General: Normal range of motion.     Cervical back: Normal range of motion and neck supple.  Skin:    Capillary Refill: Capillary refill takes less than 2 seconds.     Comments: Several insect bites to scalp and back  Neurological:     General: No focal deficit present.     Mental Status: He is alert.  Psychiatric:        Mood and Affect: Mood is anxious.     ED Results / Procedures / Treatments    Labs (all labs ordered are listed, but only abnormal results are displayed) Labs Reviewed  BASIC METABOLIC PANEL - Abnormal; Notable for the following components:      Result Value   Sodium 131 (*)    Chloride 94 (*)    Glucose, Bld 296 (*)    All other components within normal limits  URINALYSIS, ROUTINE W REFLEX MICROSCOPIC - Abnormal; Notable for the following components:  Glucose, UA >=500 (*)    Protein, ur 100 (*)    All other components within normal limits  CBC WITH DIFFERENTIAL/PLATELET  ETHANOL  RAPID URINE DRUG SCREEN, HOSP PERFORMED    EKG EKG Interpretation  Date/Time:  Friday September 13 2019 19:20:41 EDT Ventricular Rate:  94 PR Interval:  140 QRS Duration: 74 QT Interval:  366 QTC Calculation: 457 R Axis:   78 Text Interpretation: Normal sinus rhythm Right atrial enlargement Cannot rule out Anterior infarct , age undetermined Abnormal ECG No significant change since last tracing Confirmed by Isla Pence [phone]) on 09/13/2019 7:30:28 PM   Radiology No results found.  Procedures Procedures (including critical care time)  Medications Ordered in ED Medications  hydrocortisone cream 1 % (has no administration in time range)  diphenhydrAMINE (BENADRYL) capsule 25 mg (25 mg Oral Given 09/13/19 2001)  sodium chloride 0.9 % bolus 1,000 mL (0 mLs Intravenous Stopped 09/13/19 2114)  diphenhydrAMINE (BENADRYL) injection 25 mg (25 mg Intravenous Given 09/13/19 1956)  famotidine (PEPCID) IVPB 20 mg premix (0 mg Intravenous Stopped 09/13/19 2114)  ketorolac (TORADOL) 30 MG/ML injection 30 mg (30 mg Intravenous Given 09/13/19 1957)    ED Course  I have reviewed the triage vital signs and the nursing notes.  Pertinent labs & imaging results that were available during my care of the patient were reviewed by me and considered in my medical decision making (see chart for details).    MDM Rules/Calculators/A&P                          Pt has been observed for several  hours.  He has remained stable.  No evidence of anaphylaxis.  BS is elevated.  No DKA.  I ordered a glucose monitor for him.  He is encouraged to take his meds as directed by his doctor.    Return if worse. Final Clinical Impression(s) / ED Diagnoses Final diagnoses:  Multiple insect bites  Poorly controlled type 2 diabetes mellitus ([place])    Rx / DC Orders ED Discharge Orders         Ordered    blood glucose meter kit and supplies KIT        09/13/19 2251           Isla Pence, MD 09/13/19 2253

## 2019-09-13 NOTE — ED Notes (Signed)
Pt brought back to triage area for reeval, pt had ?syncopal episode, pt awake and oriented, pt intermittently holding his breath and hyperventilating, pt reports this is due to pain. Pt admits to cocaine usage today. Pt reminded repeatedly to not lean forward in WC. Pt reports pain is located only in spots that pt was stung. @ 7 red areas noted to back and back of head

## 2019-09-13 NOTE — ED Notes (Signed)
Patient verbalizes understanding of discharge instructions. Opportunity for questioning and answers were provided. Armband removed by staff, pt discharged from ED stable & ambulatory  

## 2019-09-13 NOTE — ED Triage Notes (Signed)
Pt states he was stung 25 times on his back by hornets, pt tearful with pain, no oral swelling noted. No difficulty breathing.

## 2019-09-13 NOTE — Discharge Instructions (Signed)
Take your medication prescribed by your doctor.  Over the counter benadryl for itching.

## 2019-10-03 ENCOUNTER — Ambulatory Visit: Payer: No Typology Code available for payment source | Admitting: Family Medicine

## 2019-10-25 ENCOUNTER — Ambulatory Visit: Payer: Medicare Other | Admitting: Podiatry

## 2019-11-12 ENCOUNTER — Other Ambulatory Visit (HOSPITAL_COMMUNITY): Payer: Self-pay | Admitting: Student

## 2019-11-12 DIAGNOSIS — E1142 Type 2 diabetes mellitus with diabetic polyneuropathy: Secondary | ICD-10-CM

## 2019-11-12 DIAGNOSIS — E785 Hyperlipidemia, unspecified: Secondary | ICD-10-CM

## 2019-11-12 DIAGNOSIS — F17210 Nicotine dependence, cigarettes, uncomplicated: Secondary | ICD-10-CM

## 2019-11-12 DIAGNOSIS — I10 Essential (primary) hypertension: Secondary | ICD-10-CM

## 2019-11-13 ENCOUNTER — Other Ambulatory Visit: Payer: Self-pay | Admitting: Student

## 2019-11-13 DIAGNOSIS — R0989 Other specified symptoms and signs involving the circulatory and respiratory systems: Secondary | ICD-10-CM

## 2019-11-22 ENCOUNTER — Ambulatory Visit: Payer: Medicare Other | Admitting: Podiatry

## 2019-11-26 ENCOUNTER — Ambulatory Visit (INDEPENDENT_AMBULATORY_CARE_PROVIDER_SITE_OTHER): Payer: Medicare Other | Admitting: Podiatry

## 2019-11-26 DIAGNOSIS — Z5329 Procedure and treatment not carried out because of patient's decision for other reasons: Secondary | ICD-10-CM

## 2019-11-26 NOTE — Progress Notes (Signed)
No show for appt. 

## 2019-12-05 ENCOUNTER — Other Ambulatory Visit (HOSPITAL_COMMUNITY): Payer: No Typology Code available for payment source

## 2019-12-18 ENCOUNTER — Other Ambulatory Visit: Payer: Self-pay

## 2019-12-18 ENCOUNTER — Ambulatory Visit
Admission: RE | Admit: 2019-12-18 | Discharge: 2019-12-18 | Disposition: A | Discharge status: Home / Self Care | Payer: Medicare Other | Source: Ambulatory Visit | Attending: Student | Admitting: Student

## 2019-12-18 ENCOUNTER — Other Ambulatory Visit: Payer: Self-pay | Admitting: Student

## 2019-12-18 DIAGNOSIS — M533 Sacrococcygeal disorders, not elsewhere classified: Secondary | ICD-10-CM

## 2019-12-20 ENCOUNTER — Ambulatory Visit (INDEPENDENT_AMBULATORY_CARE_PROVIDER_SITE_OTHER): Payer: Medicare Other | Admitting: Podiatry

## 2019-12-20 DIAGNOSIS — Z5329 Procedure and treatment not carried out because of patient's decision for other reasons: Secondary | ICD-10-CM

## 2019-12-20 NOTE — Progress Notes (Signed)
   Complete physical exam  Patient: William Acevedo   DOB: 11/06/1999   64 y.o. Male  MRN: [id number]  Subjective:    No chief complaint on file.   William Acevedo is a 64 y.o. male who presents today for a complete physical exam. She reports consuming a {diet types:17450} diet. {types:19826} She generally feels {DESC; WELL/FAIRLY WELL/POORLY:18703}. She reports sleeping {DESC; WELL/FAIRLY WELL/POORLY:18703}. She {does/does not:200015} have additional problems to discuss today.    Most recent fall risk assessment:    07/14/2021   10:42 AM  Fall Risk   Falls in the past year? 0  Number falls in past yr: 0  Injury with Fall? 0  Risk for fall due to : No Fall Risks  Follow up Falls evaluation completed     Most recent depression screenings:    07/14/2021   10:42 AM 06/04/2020   10:46 AM  PHQ 2/9 Scores  PHQ - 2 Score 0 0  PHQ- 9 Score 5     {VISON DENTAL STD PSA (Optional):27386}  {History (Optional):23778}  Patient Care Team: Jessup, Joy, NP as PCP - General (Nurse Practitioner)   Outpatient Medications Prior to Visit  Medication Sig   fluticasone (FLONASE) 50 MCG/ACT nasal spray Place 2 sprays into both nostrils in the morning and at bedtime. After 7 days, reduce to once daily.   norgestimate-ethinyl estradiol (SPRINTEC 28) 0.25-35 MG-MCG tablet Take 1 tablet by mouth daily.   Nystatin POWD Apply liberally to affected area 2 times per day   spironolactone (ALDACTONE) 100 MG tablet Take 1 tablet (100 mg total) by mouth daily.   No facility-administered medications prior to visit.    ROS        Objective:     There were no vitals taken for this visit. {Vitals History (Optional):23777}  Physical Exam   No results found for any visits on 08/19/21. {Show previous labs (optional):23779}    Assessment & Plan:    Routine Health Maintenance and Physical Exam  Immunization History  Administered Date(s) Administered   DTaP 01/20/1999, 03/18/1999,  05/27/1999, 02/10/2000, 08/26/2003   Hepatitis A 06/22/2007, 06/27/2008   Hepatitis B 11/07/1998, 12/15/1998, 05/27/1999   HiB (PRP-OMP) 01/20/1999, 03/18/1999, 05/27/1999, 02/10/2000   IPV 01/20/1999, 03/18/1999, 11/15/1999, 08/26/2003   Influenza,inj,Quad PF,6+ Mos 09/27/2013   Influenza-Unspecified 12/28/2011   MMR 11/14/2000, 08/26/2003   Meningococcal Polysaccharide 06/27/2011   Pneumococcal Conjugate-13 02/10/2000   Pneumococcal-Unspecified 05/27/1999, 08/10/1999   Tdap 06/27/2011   Varicella 11/15/1999, 06/22/2007    Health Maintenance  Topic Date Due   HIV Screening  Never done   Hepatitis C Screening  Never done   INFLUENZA VACCINE  08/17/2021   PAP-Cervical Cytology Screening  08/19/2021 (Originally 11/06/2019)   PAP SMEAR-Modifier  08/19/2021 (Originally 11/06/2019)   TETANUS/TDAP  08/19/2021 (Originally 06/26/2021)   HPV VACCINES  Discontinued   COVID-19 Vaccine  Discontinued    Discussed health benefits of physical activity, and encouraged her to engage in regular exercise appropriate for her age and condition.  Problem List Items Addressed This Visit   None Visit Diagnoses     Annual physical exam    -  Primary   Cervical cancer screening       Need for Tdap vaccination          No follow-ups on file.     Joy Jessup, NP   

## 2019-12-25 ENCOUNTER — Telehealth: Payer: Self-pay | Admitting: Podiatry

## 2019-12-25 NOTE — Telephone Encounter (Signed)
Pt called checking to see when his appt was as he never got a call to remind him.  Upon looking pt was a no show on 12/3 and it was noted that if he did a no show or called same day to not r/s pt as he has had several no shows. I discussed with the pt and he said he had to get a new phone because his was messed up, then the epic went down and I told pt I would call him back.  Discussed case with front office supervisor and she said it was ok to r/s this time. I called pt and left message for pt to call me to schedule an appt but it would not be until January.

## 2020-01-01 ENCOUNTER — Other Ambulatory Visit (HOSPITAL_COMMUNITY): Payer: No Typology Code available for payment source

## 2020-01-01 ENCOUNTER — Encounter (HOSPITAL_COMMUNITY): Payer: Self-pay | Admitting: Student

## 2020-01-09 ENCOUNTER — Telehealth (HOSPITAL_COMMUNITY): Payer: Self-pay | Admitting: Student

## 2020-01-09 NOTE — Telephone Encounter (Signed)
Just an FYI. We have made several attempts to contact this patient including sending a letter to schedule or reschedule their echocardiogram. We will be removing the patient from the echo WQ.   01/01/20 NO SHOW- MAILED LETTER [facility]     Thank you 

## 2020-02-19 ENCOUNTER — Encounter (HOSPITAL_COMMUNITY): Payer: Self-pay | Admitting: Cardiology

## 2020-02-19 ENCOUNTER — Telehealth (HOSPITAL_COMMUNITY): Payer: Self-pay | Admitting: Student

## 2020-02-19 ENCOUNTER — Ambulatory Visit (HOSPITAL_COMMUNITY): Payer: Medicare HMO | Attending: Cardiology

## 2020-02-19 NOTE — Progress Notes (Unsigned)
Patient ID: William Acevedo, male   DOB: [birth year], 65 y.o.   MRN: [id number]  Verified appointment "no show" status with Meeka at 8:48am.

## 2020-02-19 NOTE — Telephone Encounter (Signed)
Just an FYI. We have made several attempts to contact this patient including sending a letter to schedule or reschedule their echocardiogram. We will be removing the patient from the echo WQ.   01/01/20 NO SHOW- MAILED LETTER [facility]     Thank you 

## 2020-03-14 ENCOUNTER — Other Ambulatory Visit: Payer: Self-pay

## 2020-03-14 ENCOUNTER — Encounter (HOSPITAL_COMMUNITY): Payer: Self-pay

## 2020-03-14 ENCOUNTER — Emergency Department (HOSPITAL_COMMUNITY): Payer: 59

## 2020-03-14 ENCOUNTER — Emergency Department (HOSPITAL_COMMUNITY)
Admission: EM | Admit: 2020-03-14 | Discharge: 2020-03-14 | Disposition: A | Discharge status: Home / Self Care | Payer: 59 | Attending: Emergency Medicine | Admitting: Emergency Medicine

## 2020-03-14 DIAGNOSIS — F1420 Cocaine dependence, uncomplicated: Secondary | ICD-10-CM | POA: Diagnosis not present

## 2020-03-14 DIAGNOSIS — Z794 Long term (current) use of insulin: Secondary | ICD-10-CM | POA: Diagnosis not present

## 2020-03-14 DIAGNOSIS — N10 Acute tubulo-interstitial nephritis: Secondary | ICD-10-CM | POA: Diagnosis not present

## 2020-03-14 DIAGNOSIS — E119 Type 2 diabetes mellitus without complications: Secondary | ICD-10-CM | POA: Insufficient documentation

## 2020-03-14 DIAGNOSIS — F1410 Cocaine abuse, uncomplicated: Secondary | ICD-10-CM

## 2020-03-14 DIAGNOSIS — Z79899 Other long term (current) drug therapy: Secondary | ICD-10-CM | POA: Diagnosis not present

## 2020-03-14 DIAGNOSIS — F17210 Nicotine dependence, cigarettes, uncomplicated: Secondary | ICD-10-CM | POA: Diagnosis not present

## 2020-03-14 DIAGNOSIS — I701 Atherosclerosis of renal artery: Secondary | ICD-10-CM

## 2020-03-14 DIAGNOSIS — R112 Nausea with vomiting, unspecified: Secondary | ICD-10-CM | POA: Diagnosis not present

## 2020-03-14 DIAGNOSIS — R1084 Generalized abdominal pain: Secondary | ICD-10-CM | POA: Diagnosis not present

## 2020-03-14 DIAGNOSIS — N179 Acute kidney failure, unspecified: Secondary | ICD-10-CM | POA: Diagnosis not present

## 2020-03-14 DIAGNOSIS — Q271 Congenital renal artery stenosis: Secondary | ICD-10-CM | POA: Insufficient documentation

## 2020-03-14 DIAGNOSIS — N12 Tubulo-interstitial nephritis, not specified as acute or chronic: Secondary | ICD-10-CM

## 2020-03-14 LAB — URINALYSIS, ROUTINE W REFLEX MICROSCOPIC
Bacteria, UA: NONE SEEN
Bilirubin Urine: NEGATIVE
Glucose, UA: >=500 mg/dL — AB
Hgb urine dipstick: NEGATIVE
Ketones, ur: 20 mg/dL — AB
Leukocytes,Ua: NEGATIVE
Nitrite: NEGATIVE
Protein, ur: >=300 mg/dL — AB
Specific Gravity, Urine: 1.024 (ref 1.005–1.030)
pH: 7 (ref 5.0–8.0)

## 2020-03-14 LAB — COMPREHENSIVE METABOLIC PANEL
ALT: 23 U/L (ref 0–44)
AST: 30 U/L (ref 15–41)
Albumin: 4.6 g/dL (ref 3.5–5.0)
Alkaline Phosphatase: 71 U/L (ref 38–126)
Anion gap: 14 (ref 5–15)
BUN: 16 mg/dL (ref 8–23)
CO2: 27 mmol/L (ref 22–32)
Calcium: 9.5 mg/dL (ref 8.9–10.3)
Chloride: 93 mmol/L — ABNORMAL LOW (ref 98–111)
Creatinine, Ser: 0.87 mg/dL (ref 0.61–1.24)
GFR, Estimated: >60 mL/min (ref >60)
Glucose, Bld: 194 mg/dL — ABNORMAL HIGH (ref 70–99)
Potassium: 3.5 mmol/L (ref 3.5–5.1)
Sodium: 134 mmol/L — ABNORMAL LOW (ref 135–145)
Total Bilirubin: 0.8 mg/dL (ref 0.3–1.2)
Total Protein: 9.4 g/dL — ABNORMAL HIGH (ref 6.5–8.1)

## 2020-03-14 LAB — CBC WITH DIFFERENTIAL/PLATELET
Abs Immature Granulocytes: 0.02 10*3/uL (ref 0.00–0.07)
Basophils Absolute: 0 10*3/uL (ref 0.0–0.1)
Basophils Relative: 0 %
Eosinophils Absolute: 0 10*3/uL (ref 0.0–0.5)
Eosinophils Relative: 0 %
HCT: 48.2 % (ref 39.0–52.0)
Hemoglobin: 15.4 g/dL (ref 13.0–17.0)
Immature Granulocytes: 0 %
Lymphocytes Relative: 14 %
Lymphs Abs: 1 10*3/uL (ref 0.7–4.0)
MCH: 28.3 pg (ref 26.0–34.0)
MCHC: 32 g/dL (ref 30.0–36.0)
MCV: 88.6 fL (ref 80.0–100.0)
Monocytes Absolute: 0.2 10*3/uL (ref 0.1–1.0)
Monocytes Relative: 3 %
Neutro Abs: 6 10*3/uL (ref 1.7–7.7)
Neutrophils Relative %: 83 %
Platelets: 323 10*3/uL (ref 150–400)
RBC: 5.44 MIL/uL (ref 4.22–5.81)
RDW: 12.4 % (ref 11.5–15.5)
WBC: 7.3 10*3/uL (ref 4.0–10.5)
nRBC: 0 % (ref 0.0–0.2)

## 2020-03-14 LAB — RAPID URINE DRUG SCREEN, HOSP PERFORMED
Amphetamines: NOT DETECTED
Barbiturates: NOT DETECTED
Benzodiazepines: NOT DETECTED
Cocaine: POSITIVE — AB
Opiates: NOT DETECTED
Tetrahydrocannabinol: NOT DETECTED

## 2020-03-14 LAB — TROPONIN I (HIGH SENSITIVITY): Troponin I (High Sensitivity): 32 ng/L — ABNORMAL HIGH (ref <18)

## 2020-03-14 LAB — LIPASE, BLOOD: Lipase: 27 U/L (ref 11–51)

## 2020-03-14 LAB — LACTIC ACID, PLASMA
Lactic Acid, Venous: 1.1 mmol/L (ref 0.5–1.9)
Lactic Acid, Venous: 1.2 mmol/L (ref 0.5–1.9)

## 2020-03-14 MED ORDER — HYDROMORPHONE HCL 1 MG/ML IJ SOLN
0.5000 mg | Freq: Once | INTRAMUSCULAR | Status: AC
  Administered 2020-03-14: 0.5 mg via INTRAVENOUS
  Filled 2020-03-14: qty 1

## 2020-03-14 MED ORDER — SODIUM CHLORIDE 0.9 % IV SOLN: Freq: Once | INTRAVENOUS | Status: AC

## 2020-03-14 MED ORDER — MORPHINE SULFATE (PF) 4 MG/ML IV SOLN: 4.0000 mg | Freq: Once | INTRAVENOUS | Status: DC

## 2020-03-14 MED ORDER — ONDANSETRON HCL 4 MG/2ML IJ SOLN
4.0000 mg | Freq: Once | INTRAMUSCULAR | Status: AC
  Administered 2020-03-14: 4 mg via INTRAVENOUS
  Filled 2020-03-14: qty 2

## 2020-03-14 MED ORDER — IOHEXOL 350 MG/ML SOLN
100.0000 mL | Freq: Once | INTRAVENOUS | Status: AC | PRN
  Administered 2020-03-14: 100 mL via INTRAVENOUS

## 2020-03-14 MED ORDER — CEPHALEXIN 500 MG PO CAPS: 500.0000 mg | ORAL_CAPSULE | Freq: Four times a day (QID) | ORAL | 0 refills | Status: AC

## 2020-03-14 MED ORDER — CEPHALEXIN 500 MG PO CAPS
500.0000 mg | ORAL_CAPSULE | Freq: Once | ORAL | Status: AC
  Administered 2020-03-14: 500 mg via ORAL
  Filled 2020-03-14: qty 1

## 2020-03-14 NOTE — ED Triage Notes (Signed)
Pt arrived via walk in c/o diffuse abd pain, sudden onset this morning. Vomiting at home. Denies any diarrhea or sick contacts.

## 2020-03-14 NOTE — ED Notes (Signed)
Pt in bed, respirations even and unlabored, denies any needs at this time.

## 2020-03-14 NOTE — Discharge Instructions (Addendum)
You came to the emergency department today to be evaluated for your abdominal pain and pain with urination.  The CT scan of your abdomen showed signs consistent with a kidney infection.  Because of this you were started on the antibiotic Keflex.  Please take 1 pill 4 times a day for the next 7 days.    You may have diarrhea from the antibiotics.  It is very important that you continue to take the antibiotics even if you get diarrhea unless a medical professional tells you that you may stop taking them.  If you stop too early the bacteria you are being treated for will become stronger and you may need different, more powerful antibiotics that have more side effects and worsening diarrhea.  Please stay well hydrated and consider probiotics as they may decrease the severity of your diarrhea.    Your CT scan also showed that you had narrowing of the artery that supplies your kidney with blood and a widening of your thoracic aorta.  Due to these changes I have given you information to follow-up with a vascular doctor.    While in the emergency department your blood pressure was found to be consistently elevated.  Due to this it is important that you follow-up with your primary care provider as soon as possible for further blood pressure medication management.  I have also added sources for substance use counseling.  Please contact one of the resources for help with your cocaine use.

## 2020-03-14 NOTE — ED Provider Notes (Signed)
Physical Exam  BP (!) 191/100   Pulse 99   Temp 98.3 F (36.8 C) (Oral)   Resp 19   SpO2 100%   Physical Exam Vitals and nursing note reviewed.  Constitutional:      General: William Acevedo is not in acute distress.    Appearance: William Acevedo is not ill-appearing, toxic-appearing or diaphoretic.  HENT:     Head: Normocephalic.  Eyes:     General: No scleral icterus.       Right eye: No discharge.        Left eye: No discharge.  Cardiovascular:     Rate and Rhythm: Normal rate.  Pulmonary:     Effort: No tachypnea or respiratory distress.     Breath sounds: Normal breath sounds. No decreased breath sounds, wheezing, rhonchi or rales.  Abdominal:     General: Abdomen is flat. Bowel sounds are normal. There is no distension. There are no signs of injury.     Palpations: Abdomen is soft. There is no mass or pulsatile mass.     Tenderness: There is no abdominal tenderness. There is no guarding or rebound.     Hernia: There is no hernia in the umbilical area or ventral area.  Musculoskeletal:     Cervical back: Neck supple.  Skin:    General: Skin is warm and dry.  Neurological:     General: No focal deficit present.     Mental Status: William Acevedo is alert.  Psychiatric:        Behavior: Behavior is cooperative.     ED Course/Procedures     Procedures  MDM   Care of patient assumed from PA F[place] at 15:30.  Agree with history, physical exam and plan.  See their note for further details. Briefly, Patient presented to emergency room today with severe umbilical abdominal pain was 10/10, constant, worse with touch and severe.  William Acevedo states it seems to be worse with movement but is also constantly severe.  William Acevedo states nausea and states that William Acevedo vomited 4 times and has developed a large glass earlier today.  William Acevedo states that William Acevedo was feeling well yesterday.  William Acevedo denies any chest pain or shortness of breath.  Denies any abnormal bowel movements diarrhea or constipation but does endorse some pain with urination which William Acevedo  states has been present for the past 2 days.  No hematuria.  William Acevedo denies any history of UTIs.  Denies any penile pain or penile discharge.  William Acevedo is not sexually active for many months.  Pain controlled after receiving 0.5 mg Dilaudid. Pending d/c after dissection study   1400 patient is resting comfortably.  Easily arousable to voice.  Patient denies any abdominal pain at present.  Patient reports that William Acevedo is having some "queasiness."  Patient reports that William Acevedo has not taken any of his blood pressure blood pressure medication today.  Patient reports that William Acevedo sees a primary care provider.    Dissection study showed: 1) changes in changes involving the lower pole of the right kidney suggesting pyelonephritis.  2) Approximate 50% focal stenosis of the origin of the left renal artery, mild narrowing at the origin of the celiac axis and SMA. 3) mild prominence of the ascending thoracic aorta measuring 3.5 cm  4) no evidence of aneurysm or dissection involving the thoracoabdominal aorta.  Focal narrowing of the abdominal aorta at the bifurcation. 5) no acute findings any chest 6) punctate nonobstructing right renal stone, couple small right renal cyst.  Due to patient's CT  scan findings suggesting pyelonephritis and his complaints of dysuria we will send urine for culture.  No growth noted on previous urine culture.  Will treat for pyelonephritis with 7-day course of Keflex.  Low suspicion for hypertensive emergency as has no visual disturbance, chest pain, back pain, focal neurological deficits, or signs of end organ damage.    We will have patient follow-up with his primary care provider for further management of his hypertension and diabetes.  Will have patient follow-up with vascular surgery due to his renal artery stenosis and to arrange follow-up CTA for surveillance of his ascending thoracic aorta.    Discussed results, findings, treatment and follow up. Patient advised of return precautions. Patient  verbalized understanding and agreed with plan.      William Schroeder, PA-C 03/15/20 [id number]    William Bale, MD 03/15/20 1705

## 2020-03-14 NOTE — ED Provider Notes (Signed)
[facility] DEPT Provider Note   CSN: [id number] Arrival date & time: 03/14/20  1055    History Chief Complaint  Patient presents with  . Abdominal Pain    [name] is a 65 y.o. male.  HPI Patient is a 65 year old male with past medical history significant for depression, DM 2, cocaine use most recently used yesterday, dehydration and AKI from dehydration.  Patient presented to emergency room today with severe umbilical abdominal pain was 10/10, constant, worse with touch and severe.  He states it seems to be worse with movement but is also constantly severe.  He states nausea and states that he vomited 4 times and has developed a large glass earlier today.  He states that he was feeling well yesterday.  He denies any chest pain or shortness of breath.  Denies any abnormal bowel movements diarrhea or constipation but does endorse some pain with urination which he states has been present for the past 2 days.  No hematuria.  He denies any history of UTIs.  Denies any penile pain or penile discharge.  He is not sexually active for many months.  No other associated symptoms.  No fevers, chills, lightheadedness, dizziness. Notably patient has been a no-show for multiple cardiology visits.    Past Medical History:  Diagnosis Date  . Carpal tunnel syndrome   . Depression   . Diabetes mellitus without complication ([place])   . Neuropathy     Patient Active Problem List   Diagnosis Date Noted  . Cocaine use disorder, severe, dependence ([place]) 04/08/2019  . Major depressive disorder 04/08/2019  . Epiglottitis 04/07/2019  . Cocaine use 04/07/2019  . Acute encephalopathy 04/07/2019  . Overdose 07/06/2015  . AKI (acute kidney injury) ([place]) 07/06/2015  . Polysubstance abuse ([place]) 07/06/2015  . DM2 (diabetes mellitus, type 2) ([place]) 07/06/2015  . Dehydration   . MDD (major depressive disorder), recurrent episode, mild ([place])   . Uncomplicated alcohol  dependence ([place])     Past Surgical History:  Procedure Laterality Date  . TESTICLE TORSION REDUCTION         History reviewed. No pertinent family history.  Social History   Tobacco Use  . Smoking status: Current Every Day Smoker    Packs/day: 0.50    Types: Cigarettes  . Smokeless tobacco: Never Used  Substance Use Topics  . Alcohol use: Yes  . Drug use: Yes    Types: Cocaine    Home Medications Prior to Admission medications   Medication Sig Start Date End Date Taking? Authorizing Provider  acetaminophen-codeine (TYLENOL #2) 300-15 MG tablet Take 1 tablet by mouth every 4 (four) hours as needed for moderate pain.   Yes [provider]  amitriptyline (ELAVIL) 10 MG tablet Take 20 mg by mouth at bedtime.   Yes [provider]  atorvastatin (LIPITOR) 20 MG tablet Take 20 mg by mouth daily.   Yes [provider]  cyclobenzaprine (FLEXERIL) 10 MG tablet Take 1 tablet (10 mg total) by mouth 2 (two) times daily as needed for muscle spasms. 01/18/19  Yes Tegeler, Gwenyth Allegra, MD  empagliflozin (JARDIANCE) 25 MG TABS tablet Take 25 mg by mouth daily.   Yes [provider]  folic acid (FOLVITE) 1 MG tablet Take 1 tablet (1 mg total) by mouth daily. 04/08/19  Yes [name], MD  gabapentin (NEURONTIN) 600 MG tablet Take 1,200 mg by mouth at bedtime.   Yes [provider]  glipiZIDE (GLUCOTROL) 10 MG tablet  Take 1 tablet (10 mg total) by mouth daily before breakfast. Patient taking differently: Take 10 mg by mouth daily. 08/24/19  Yes [name], PA-C  insulin aspart (NOVOLOG FLEXPEN) 100 UNIT/ML FlexPen Inject 5 Units into the skin 3 (three) times daily with meals. 04/08/19  Yes [name], MD  insulin glargine (LANTUS) 100 UNIT/ML Solostar Pen Inject 40 Units into the skin daily. 08/24/19  Yes [name], PA-C  losartan (COZAAR) 25 MG tablet Take 25 mg by mouth daily.   Yes [provider]  melatonin 3 MG  TABS tablet Take 12 mg by mouth at bedtime.   Yes [provider]  tadalafil (CIALIS) 10 MG tablet Take 10 mg by mouth daily as needed for erectile dysfunction.   Yes [provider]  blood glucose meter kit and supplies KIT Dispense based on patient and insurance preference. Use up to four times daily as directed. (FOR ICD-9 250.00, 250.01). 09/13/19   Isla Pence, MD  FLUoxetine (PROZAC) 20 MG capsule Take 1 capsule (20 mg total) by mouth daily. Patient not taking: No sig reported 04/09/19   [name], MD  gabapentin (NEURONTIN) 100 MG capsule Take 1 capsule (100 mg total) by mouth 3 (three) times daily. Patient not taking: Reported on 03/14/2020 12/02/13   Muthersbaugh, [name], PA-C  ibuprofen (ADVIL) 800 MG tablet Take 1 tablet (800 mg total) by mouth every 8 (eight) hours as needed. Patient not taking: No sig reported 09/21/18   [name], PA-C  Insulin Pen Needle 29G X 12MM MISC Per instructions 04/08/19   [name], MD  ketoconazole (NIZORAL) 2 % cream Apply 1 fingertip amount to each foot daily. Patient not taking: No sig reported 08/10/17   [name], DPM  predniSONE (DELTASONE) 20 MG tablet Take 2 tablets (40 mg total) by mouth daily with breakfast. Patient not taking: Reported on 08/24/2019 04/09/19   [name], MD  thiamine 100 MG tablet Take 1 tablet (100 mg total) by mouth daily. Patient not taking: No sig reported 04/09/19   [name], MD  traMADol (ULTRAM) 50 MG tablet Take 1 tablet (50 mg total) by mouth every 6 (six) hours as needed for severe pain. Patient not taking: No sig reported 09/21/18   [name], PA-C    Allergies    Trazodone, Lisinopril, and Omeprazole  Review of Systems   Review of Systems  Constitutional: Negative for chills and fever.  HENT: Negative for congestion.   Eyes: Negative for pain.  Respiratory: Negative for cough and shortness of breath.   Cardiovascular: Negative for chest pain and  leg swelling.  Gastrointestinal: Positive for abdominal pain, nausea and vomiting. Negative for diarrhea and rectal pain.  Genitourinary: Negative for dysuria.  Musculoskeletal: Negative for myalgias.  Skin: Negative for rash.  Neurological: Negative for dizziness and headaches.    Physical Exam Updated Vital Signs BP (!) 191/100   Pulse 99   Temp 98.3 F (36.8 C) (Oral)   Resp 19   SpO2 100%   Physical Exam Vitals and nursing note reviewed.  Constitutional:      General: He is in acute distress.  HENT:     Head: Normocephalic and atraumatic.     Nose: Nose normal.  Eyes:     General: No scleral icterus. Cardiovascular:     Rate and Rhythm: Normal rate and regular rhythm.     Pulses: Normal pulses.     Heart sounds: Normal heart sounds.  Comments: Bilateral radial artery pulses and DP pulses 3+ Pulmonary:     Effort: Pulmonary effort is normal. No respiratory distress.     Breath sounds: No wheezing.  Abdominal:     Palpations: Abdomen is soft.     Tenderness: There is abdominal tenderness.     Comments: Abdomen is soft with diffuse tenderness with light palpation.  Patient is not guarding.  There is some rebound tenderness. No masses.  Pulsatile masses not palpated.  Genitourinary:    Comments: Single testicle present.  Normal lie.  No urethral discharge Musculoskeletal:     Cervical back: Normal range of motion.     Right lower leg: No edema.     Left lower leg: No edema.  Skin:    General: Skin is warm and dry.     Capillary Refill: Capillary refill takes less than 2 seconds.  Neurological:     Mental Status: He is alert. Mental status is at baseline.  Psychiatric:        Mood and Affect: Mood normal.        Behavior: Behavior normal.     ED Results / Procedures / Treatments   Labs (all labs ordered are listed, but only abnormal results are displayed) Labs Reviewed  COMPREHENSIVE METABOLIC PANEL - Abnormal; Notable for the following components:       Result Value   Sodium 134 (*)    Chloride 93 (*)    Glucose, Bld 194 (*)    Total Protein 9.4 (*)    All other components within normal limits  URINALYSIS, ROUTINE W REFLEX MICROSCOPIC - Abnormal; Notable for the following components:   Glucose, UA >=500 (*)    Ketones, ur 20 (*)    Protein, ur >=300 (*)    All other components within normal limits  RAPID URINE DRUG SCREEN, HOSP PERFORMED - Abnormal; Notable for the following components:   Cocaine POSITIVE (*)    All other components within normal limits  TROPONIN I (HIGH SENSITIVITY) - Abnormal; Notable for the following components:   Troponin I (High Sensitivity) 32 (*)    All other components within normal limits  URINE CULTURE  CBC WITH DIFFERENTIAL/PLATELET  LIPASE, BLOOD  LACTIC ACID, PLASMA  LACTIC ACID, PLASMA  TROPONIN I (HIGH SENSITIVITY)    EKG None  Radiology CT Angio Chest/Abd/Pel for Dissection W and/or W/WO  Result Date: 03/14/2020 CLINICAL DATA:  Abdominal pain, suspected aortic dissection. Severe umbilical pain. Possible mesenteric ischemia. Abdominal aortic aneurysm. EXAM: CT ANGIOGRAPHY CHEST, ABDOMEN AND PELVIS TECHNIQUE: Non-contrast CT of the chest was initially obtained. Multidetector CT imaging through the chest, abdomen and pelvis was performed using the standard protocol during bolus administration of intravenous contrast. Multiplanar reconstructed images and MIPs were obtained and reviewed to evaluate the vascular anatomy. CONTRAST:  154m OMNIPAQUE IOHEXOL 350 MG/ML SOLN COMPARISON:  None. FINDINGS: CTA CHEST FINDINGS Cardiovascular: Heart is normal size. There is calcified plaque over the left main, left anterior descending and right coronary arteries. Mild prominence of the ascending thoracic aorta measuring 3.5 cm in diameter. No evidence of aneurysm or dissection involving the thoracic aorta. There is calcified plaque over the thoracic aorta. Common takeoff of the right brachiocephalic and left common  carotid arteries. Moderate calcified plaque over the proximal left subclavian artery with mild focal narrowing 3 cm distal to its origin. Pulmonary arterial system is unremarkable. Mediastinum/Nodes: No mediastinal or hilar adenopathy. Remaining mediastinal structures are unremarkable. Lungs/Pleura: Lungs are adequately inflated. No focal airspace consolidation or  effusion. Subtle paraseptal emphysema over the apices. Small calcified granuloma over the left lower lobe. Airways are normal. Musculoskeletal: No focal abnormality. Review of the MIP images confirms the above findings. CTA ABDOMEN AND PELVIS FINDINGS VASCULAR Aorta: Abdominal aorta demonstrates no evidence of aneurysm or dissection. There is moderate calcified plaque throughout the abdominal aorta. There is focal narrowing of the abdominal aorta at the bifurcation. Celiac: Minimal narrowing at the origin of the celiac axis which is otherwise patent. SMA: Moderate calcified plaque at the origin of the superior mesenteric artery which is patent. Renals: Calcified plaque at the origin of the renal arteries bilaterally. Approximately 50% focal stenosis of the origin of the left renal artery with minimal poststenotic dilatation. Single bilateral small accessory renal arteries are also present. IMA: Patent. Inflow: Calcified plaque over the common iliac arteries. Internal and external iliac arteries are patent without focal stenosis or occlusion. Veins: No obvious venous abnormality within the limitations of this arterial phase study. Review of the MIP images confirms the above findings. NON-VASCULAR Hepatobiliary: Liver, gallbladder and biliary tree are normal. Pancreas: Normal. Spleen: Normal. Adrenals/Urinary Tract: Adrenal glands are normal. Kidneys are normal in size. Nonobstructing punctate stone over the lower pole right kidney. Couple small right renal cysts are present. There is moderate decreased perfusion of the lower pole of the right kidney on the  arterial phase images with more streaky parenchymal hypodensities on the venous phase images suggesting infection/pyelonephritis. Minimal stranding of the adjacent perinephric fat. Bladder is unremarkable. Stomach/Bowel: Stomach and small bowel are normal. Appendix is not visualized. Mild fecal retention throughout the colon which is otherwise unremarkable. Lymphatic: No adenopathy. Reproductive: Unremarkable. Other: None. Musculoskeletal: No focal abnormality. Review of the MIP images confirms the above findings. IMPRESSION: 1. Changes involving the lower pole of the right kidney as described suggesting pyelonephritis. 2. No evidence of aneurysm or dissection involving the thoracoabdominal aorta. Focal narrowing of the abdominal aorta at the bifurcation. 3. Approximate 50% focal stenosis of the origin of the left renal artery. Mild narrowing at the origin of the celiac axis and superior mesenteric artery. 4. Aortic atherosclerosis. 5. No acute findings in the chest. 6. Punctate nonobstructing right renal stone. Couple small right renal cysts. 7. Mild prominence of the ascending thoracic aorta measuring 3.5 cm. Recommend annual imaging followup by CTA or MRA. This recommendation follows 2010 ACCF/AHA/AATS/ACR/ASA/SCA/SCAI/SIR/STS/SVM Guidelines for the Diagnosis and Management of Patients with Thoracic Aortic Disease. Circulation.2010; 121: Z610-R604. Aortic aneurysm NOS (ICD10-I71.9). Aortic Atherosclerosis (ICD10-I70.0). Electronically Signed   By: Marin Olp M.D.   On: 03/14/2020 14:55    Procedures Procedures   Medications Ordered in ED Medications  0.9 %  sodium chloride infusion (0 mLs Intravenous Stopped 03/14/20 1327)  ondansetron (ZOFRAN) injection 4 mg (4 mg Intravenous Given 03/14/20 1227)  HYDROmorphone (DILAUDID) injection 0.5 mg (0.5 mg Intravenous Given 03/14/20 1227)  iohexol (OMNIPAQUE) 350 MG/ML injection 100 mL (100 mLs Intravenous Contrast Given 03/14/20 1408)    ED Course  I have  reviewed the triage vital signs and the nursing notes.  Pertinent labs & imaging results that were available during my care of the patient were reviewed by me and considered in my medical decision making (see chart for details).    MDM Rules/Calculators/A&P                          Patient is 65 year old male with past medical history significant for cocaine abuse.  Also history of depression, DM  2, AKI from severe dehydration.  Last used cocaine yesterday evening.  Has sudden onset severe abdominal pain started today.  Urine drug screen negative for anything apart from cocaine which he endorsed.  Urinalysis shows no bacteria, nitrates or leukocytes.  Protein is present as well as glucosuria.  No evidence of infection on this. Also no hematuria have low suspicion for kidney stones.  CMP without hyponatremia of any significance.  Mildly elevated blood sugar. This will likely improve with IV hydration.  1 L normal saline ordered.  CBC without leukocytosis or anemia.  Lipase within normal limits I have low suspicion for pancreatitis.  Lactic acid within normal limits [place] lower suspicion for mesenteric ischemia.  Troponin x132.  EKG does not appear to have any obvious ischemia.  CT dissection study ordered and pending at this time.  Patient care handed off to oncoming team.  Patient made aware of plan to discharge if no abnormalities are found.  Or no indications for admission.  He is agreeable to this.  My reassessment has abdomen is less tender but still somewhat tender. He states his pain is much improved.  Final Clinical Impression(s) / ED Diagnoses Final diagnoses:  Generalized abdominal pain  Cocaine abuse [facility])    Rx / DC Orders ED Discharge Orders    None       Tedd Sias, [place] 03/14/20 1608    Gareth Morgan, MD 03/15/20 [phone]

## 2020-03-15 ENCOUNTER — Emergency Department (HOSPITAL_COMMUNITY): Payer: 59

## 2020-03-15 ENCOUNTER — Encounter (HOSPITAL_COMMUNITY): Payer: Self-pay | Admitting: Emergency Medicine

## 2020-03-15 ENCOUNTER — Emergency Department (HOSPITAL_COMMUNITY)
Admission: EM | Admit: 2020-03-15 | Discharge: 2020-03-15 | Disposition: A | Discharge status: Home / Self Care | Payer: 59 | Attending: Emergency Medicine | Admitting: Emergency Medicine

## 2020-03-15 ENCOUNTER — Other Ambulatory Visit: Payer: Self-pay

## 2020-03-15 DIAGNOSIS — E119 Type 2 diabetes mellitus without complications: Secondary | ICD-10-CM | POA: Insufficient documentation

## 2020-03-15 DIAGNOSIS — R1084 Generalized abdominal pain: Secondary | ICD-10-CM | POA: Diagnosis not present

## 2020-03-15 DIAGNOSIS — N280 Ischemia and infarction of kidney: Secondary | ICD-10-CM | POA: Diagnosis not present

## 2020-03-15 DIAGNOSIS — F17210 Nicotine dependence, cigarettes, uncomplicated: Secondary | ICD-10-CM | POA: Diagnosis not present

## 2020-03-15 DIAGNOSIS — Z794 Long term (current) use of insulin: Secondary | ICD-10-CM | POA: Insufficient documentation

## 2020-03-15 DIAGNOSIS — R110 Nausea: Secondary | ICD-10-CM

## 2020-03-15 DIAGNOSIS — Z79899 Other long term (current) drug therapy: Secondary | ICD-10-CM | POA: Insufficient documentation

## 2020-03-15 LAB — COMPREHENSIVE METABOLIC PANEL
ALT: 21 U/L (ref 0–44)
AST: 27 U/L (ref 15–41)
Albumin: 4.2 g/dL (ref 3.5–5.0)
Alkaline Phosphatase: 65 U/L (ref 38–126)
Anion gap: 13 (ref 5–15)
BUN: 24 mg/dL — ABNORMAL HIGH (ref 8–23)
CO2: 28 mmol/L (ref 22–32)
Calcium: 9.4 mg/dL (ref 8.9–10.3)
Chloride: 92 mmol/L — ABNORMAL LOW (ref 98–111)
Creatinine, Ser: 1.18 mg/dL (ref 0.61–1.24)
GFR, Estimated: >60 mL/min (ref >60)
Glucose, Bld: 242 mg/dL — ABNORMAL HIGH (ref 70–99)
Potassium: 4 mmol/L (ref 3.5–5.1)
Sodium: 133 mmol/L — ABNORMAL LOW (ref 135–145)
Total Bilirubin: 0.9 mg/dL (ref 0.3–1.2)
Total Protein: 8.3 g/dL — ABNORMAL HIGH (ref 6.5–8.1)

## 2020-03-15 LAB — URINE CULTURE: Culture: NO GROWTH

## 2020-03-15 LAB — LACTIC ACID, PLASMA: Lactic Acid, Venous: 1.8 mmol/L (ref 0.5–1.9)

## 2020-03-15 LAB — CBC
HCT: 47.4 % (ref 39.0–52.0)
Hemoglobin: 15.3 g/dL (ref 13.0–17.0)
MCH: 28.2 pg (ref 26.0–34.0)
MCHC: 32.3 g/dL (ref 30.0–36.0)
MCV: 87.5 fL (ref 80.0–100.0)
Platelets: 283 10*3/uL (ref 150–400)
RBC: 5.42 MIL/uL (ref 4.22–5.81)
RDW: 12.3 % (ref 11.5–15.5)
WBC: 7.3 10*3/uL (ref 4.0–10.5)
nRBC: 0 % (ref 0.0–0.2)

## 2020-03-15 LAB — LIPASE, BLOOD: Lipase: 52 U/L — ABNORMAL HIGH (ref 11–51)

## 2020-03-15 MED ORDER — LABETALOL HCL 5 MG/ML IV SOLN
10.0000 mg | Freq: Once | INTRAVENOUS | Status: AC
  Administered 2020-03-15: 10 mg via INTRAVENOUS
  Filled 2020-03-15: qty 4

## 2020-03-15 MED ORDER — HYDROCODONE-ACETAMINOPHEN 5-325 MG PO TABS: 1.0000 | ORAL_TABLET | ORAL | 0 refills | Status: DC | PRN

## 2020-03-15 MED ORDER — DIPHENHYDRAMINE HCL 50 MG/ML IJ SOLN
25.0000 mg | Freq: Once | INTRAMUSCULAR | Status: AC
  Administered 2020-03-15: 25 mg via INTRAVENOUS
  Filled 2020-03-15: qty 1

## 2020-03-15 MED ORDER — ONDANSETRON HCL 4 MG PO TABS
4.0000 mg | ORAL_TABLET | Freq: Once | ORAL | Status: AC
  Administered 2020-03-15: 4 mg via ORAL
  Filled 2020-03-15: qty 1

## 2020-03-15 MED ORDER — HYDROMORPHONE HCL 1 MG/ML IJ SOLN
0.5000 mg | Freq: Once | INTRAMUSCULAR | Status: AC
  Administered 2020-03-15: 0.5 mg via INTRAVENOUS
  Filled 2020-03-15: qty 1

## 2020-03-15 MED ORDER — IOHEXOL 300 MG/ML  SOLN
100.0000 mL | Freq: Once | INTRAMUSCULAR | Status: AC | PRN
  Administered 2020-03-15: 100 mL via INTRAVENOUS

## 2020-03-15 NOTE — ED Notes (Signed)
Pt said the medication really helped

## 2020-03-15 NOTE — ED Triage Notes (Signed)
Patient here from home reporting abd pain sudden onset. Seen here for same yesterday.

## 2020-03-15 NOTE — ED Provider Notes (Signed)
[facility] DEPT Provider Note   CSN: [id number] Arrival date & time: 03/15/20  1141   Chief Complaint: Abdominal Pain   History Chief Complaint  Patient presents with  . Abdominal Pain    William Acevedo is a 65 y.o. male.  HPI   William Acevedo is a 65 y.o. gentleman w/ PMHx cocaine use disorder, AUD, NIDDM type 2, MDD, and epiglottitis, presenting with abdominal pain that has remained constant since yesterday. He describes the pain as a sharp, stabbing pain located diffusely in the center of his abdomen which started after eating but has remained constant since. Pain is worse with breathing, better with movement, associated with nausea and vomiting x 4 yesterday and x 1 today as well as decreased appetite, chills but no fever, bilateral flank pain, and chronic dysuria. Notes his stools have become lighter and larger recently, last BM was yesterday. Denies dark or bloody stools. Has never had this pain prior to yesterday and denies history of abdominal surgery. Last used cocaine yesterday after his pain started. Drinks about 3 alcoholic beverages per day and denies history of withdrawal. Notes he takes Losartan at home and states he has likely missed medication doses recently.   Past Medical History:  Diagnosis Date  . Carpal tunnel syndrome   . Depression   . Diabetes mellitus without complication ([place])   . Neuropathy     Patient Active Problem List   Diagnosis Date Noted  . Cocaine use disorder, severe, dependence ([place]) 04/08/2019  . Major depressive disorder 04/08/2019  . Epiglottitis 04/07/2019  . Cocaine use 04/07/2019  . Acute encephalopathy 04/07/2019  . Overdose 07/06/2015  . AKI (acute kidney injury) ([place]) 07/06/2015  . Polysubstance abuse ([place]) 07/06/2015  . DM2 (diabetes mellitus, type 2) ([place]) 07/06/2015  . Dehydration   . MDD (major depressive disorder), recurrent episode, mild ([place])   . Uncomplicated alcohol dependence ([place])      Past Surgical History:  Procedure Laterality Date  . TESTICLE TORSION REDUCTION        No family history on file.  Social History   Tobacco Use  . Smoking status: Current Every Day Smoker    Packs/day: 0.50    Types: Cigarettes  . Smokeless tobacco: Never Used  Substance Use Topics  . Alcohol use: Yes  . Drug use: Yes    Types: Cocaine    Home Medications Prior to Admission medications   Medication Sig Start Date End Date Taking? Authorizing Provider  acetaminophen-codeine (TYLENOL #2) 300-15 MG tablet Take 1 tablet by mouth every 4 (four) hours as needed for moderate pain.   Yes [provider]  amitriptyline (ELAVIL) 10 MG tablet Take 20 mg by mouth at bedtime.   Yes [provider]  atorvastatin (LIPITOR) 20 MG tablet Take 20 mg by mouth daily.   Yes [provider]  empagliflozin (JARDIANCE) 25 MG TABS tablet Take 25 mg by mouth daily.   Yes [provider]  folic acid (FOLVITE) 1 MG tablet Take 1 tablet (1 mg total) by mouth daily. 04/08/19  Yes [name], MD  gabapentin (NEURONTIN) 600 MG tablet Take 1,200 mg by mouth at bedtime.   Yes [provider]  glipiZIDE (GLUCOTROL) 10 MG tablet Take 1 tablet (10 mg total) by mouth daily before breakfast. Patient taking differently: Take 10 mg by mouth daily. 08/24/19  Yes [name], PA-C  HYDROcodone-acetaminophen (NORCO/VICODIN) 5-325 MG tablet Take 1 tablet by mouth every 4 (four) hours  as needed for severe pain. 03/15/20  Yes [name], MD  insulin aspart (NOVOLOG FLEXPEN) 100 UNIT/ML FlexPen Inject 5 Units into the skin 3 (three) times daily with meals. 04/08/19  Yes [name], MD  insulin glargine (LANTUS) 100 UNIT/ML Solostar Pen Inject 40 Units into the skin daily. 08/24/19  Yes [name], PA-C  losartan (COZAAR) 25 MG tablet Take 25 mg by mouth daily.   Yes [provider]  melatonin 3 MG TABS tablet Take 12 mg by mouth at bedtime.    Yes [provider]  tadalafil (CIALIS) 10 MG tablet Take 10 mg by mouth daily as needed for erectile dysfunction.   Yes [provider]  blood glucose meter kit and supplies KIT Dispense based on patient and insurance preference. Use up to four times daily as directed. (FOR ICD-9 250.00, 250.01). 09/13/19   Isla Pence, MD  cephALEXin (KEFLEX) 500 MG capsule Take 1 capsule (500 mg total) by mouth 4 (four) times daily for 7 days. 03/14/20 03/21/20  Loni Beckwith, PA-C  cyclobenzaprine (FLEXERIL) 10 MG tablet Take 1 tablet (10 mg total) by mouth 2 (two) times daily as needed for muscle spasms. Patient not taking: No sig reported 01/18/19   Tegeler, Gwenyth Allegra, MD  FLUoxetine (PROZAC) 20 MG capsule Take 1 capsule (20 mg total) by mouth daily. Patient not taking: No sig reported 04/09/19   [name], MD  gabapentin (NEURONTIN) 100 MG capsule Take 1 capsule (100 mg total) by mouth 3 (three) times daily. Patient not taking: No sig reported 12/02/13   Muthersbaugh, [name], PA-C  ibuprofen (ADVIL) 800 MG tablet Take 1 tablet (800 mg total) by mouth every 8 (eight) hours as needed. Patient not taking: No sig reported 09/21/18   [name], PA-C  Insulin Pen Needle 29G X 12MM MISC Per instructions 04/08/19   [name], MD  ketoconazole (NIZORAL) 2 % cream Apply 1 fingertip amount to each foot daily. Patient not taking: No sig reported 08/10/17   [name], DPM  predniSONE (DELTASONE) 20 MG tablet Take 2 tablets (40 mg total) by mouth daily with breakfast. Patient not taking: No sig reported 04/09/19   [name], MD  thiamine 100 MG tablet Take 1 tablet (100 mg total) by mouth daily. Patient not taking: No sig reported 04/09/19   [name], MD  traMADol (ULTRAM) 50 MG tablet Take 1 tablet (50 mg total) by mouth every 6 (six) hours as needed for severe pain. Patient not taking: No sig reported 09/21/18   [name], PA-C    Allergies     Trazodone, Lisinopril, and Omeprazole  Review of Systems   Review of Systems   10-point review of systems otherwise negative except as noted above in HPI.   Physical Exam Updated Vital Signs BP (!) 155/91   Pulse 80   Temp 98.9 F (37.2 C) (Oral)   Resp 18   Ht 5' 6"  (1.676 m)   Wt 53.5 kg   SpO2 98%   BMI 19.05 kg/m   Physical Exam   General: Patient appears uncomfortable, although in no acute distress. Eyes: Sclera non-icteric. No conjunctival injection.  HENT: Mucus membranes slightly dry. No nasal discharge. Respiratory: Lungs are CTA, bilaterally. No wheezes, rales, or rhonchi. No tachypnea or increased work of breathing.  Cardiovascular: Regular rate and rhythm. No murmurs, rubs, or gallops. No lower extremity edema. Distal extremities warm and well perfused.  Abdominal: Soft with severe tenderness  to palpation throughout entire abdomen. Bowel sounds intact. No distention or rebound tenderness.  GU: There is mild bilateral flank tenderness to palpation.  Neurological: Alert and oriented x 3. CN II-XII intact.  Musculoskeletal: Able to move all four extremities equally.  Skin: No lesions. No rashes.  Psych: Normal affect. Normal tone of voice.   ED Results / Procedures / Treatments   Labs (all labs ordered are listed, but only abnormal results are displayed) Labs Reviewed  LIPASE, BLOOD - Abnormal; Notable for the following components:      Result Value   Lipase 52 (*)    All other components within normal limits  COMPREHENSIVE METABOLIC PANEL - Abnormal; Notable for the following components:   Sodium 133 (*)    Chloride 92 (*)    Glucose, Bld 242 (*)    BUN 24 (*)    Total Protein 8.3 (*)    All other components within normal limits  CBC  LACTIC ACID, PLASMA  URINALYSIS, ROUTINE W REFLEX MICROSCOPIC    EKG None  Radiology CT ABDOMEN PELVIS W CONTRAST  Result Date: 03/15/2020 CLINICAL DATA:  Acute generalized abdominal pain. EXAM: CT ABDOMEN AND  PELVIS WITH CONTRAST TECHNIQUE: Multidetector CT imaging of the abdomen and pelvis was performed using the standard protocol following bolus administration of intravenous contrast. CONTRAST:  110m OMNIPAQUE IOHEXOL 300 MG/ML  SOLN COMPARISON:  March 14, 2020. FINDINGS: Lower chest: No acute abnormality. Hepatobiliary: No focal liver abnormality is seen. No gallstones, gallbladder wall thickening, or biliary dilatation. Pancreas: Unremarkable. No pancreatic ductal dilatation or surrounding inflammatory changes. Spleen: Normal in size without focal abnormality. Adrenals/Urinary Tract: Adrenal glands appear normal. Left kidney appears normal. Small nonobstructive right renal calculus is noted. Right renal cyst is noted. There is again noted decreased perfusion of the lower pole of the right kidney which is concerning for some degree of infarction; pyelonephritis is also concern, but no definite inflammatory changes are noted in this area. No hydronephrosis or renal obstruction is noted. Urinary bladder is unremarkable. Stomach/Bowel: Stomach is within normal limits. Appendix appears normal. No evidence of bowel wall thickening, distention, or inflammatory changes. Vascular/Lymphatic: Aortic atherosclerosis. No enlarged abdominal or pelvic lymph nodes. Reproductive: Prostate is unremarkable. Other: No abdominal wall hernia or abnormality. No abdominopelvic ascites. Musculoskeletal: No acute or significant osseous findings. IMPRESSION: 1. There is again noted decreased perfusion of the lower pole of the right kidney which is most concerning for some degree of renal infarction; pyelonephritis may be considered, but no definite inflammatory changes are noted in this area. 2. Small nonobstructive right renal calculus. 3. Aortic atherosclerosis. Aortic Atherosclerosis (ICD10-I70.0). Electronically Signed   By: JMarijo ConceptionM.D.   On: 03/15/2020 17:15   CT Angio Chest/Abd/Pel for Dissection W and/or W/WO  Result  Date: 03/14/2020 CLINICAL DATA:  Abdominal pain, suspected aortic dissection. Severe umbilical pain. Possible mesenteric ischemia. Abdominal aortic aneurysm. EXAM: CT ANGIOGRAPHY CHEST, ABDOMEN AND PELVIS TECHNIQUE: Non-contrast CT of the chest was initially obtained. Multidetector CT imaging through the chest, abdomen and pelvis was performed using the standard protocol during bolus administration of intravenous contrast. Multiplanar reconstructed images and MIPs were obtained and reviewed to evaluate the vascular anatomy. CONTRAST:  1059mOMNIPAQUE IOHEXOL 350 MG/ML SOLN COMPARISON:  None. FINDINGS: CTA CHEST FINDINGS Cardiovascular: Heart is normal size. There is calcified plaque over the left main, left anterior descending and right coronary arteries. Mild prominence of the ascending thoracic aorta measuring 3.5 cm in diameter. No evidence of aneurysm or dissection  involving the thoracic aorta. There is calcified plaque over the thoracic aorta. Common takeoff of the right brachiocephalic and left common carotid arteries. Moderate calcified plaque over the proximal left subclavian artery with mild focal narrowing 3 cm distal to its origin. Pulmonary arterial system is unremarkable. Mediastinum/Nodes: No mediastinal or hilar adenopathy. Remaining mediastinal structures are unremarkable. Lungs/Pleura: Lungs are adequately inflated. No focal airspace consolidation or effusion. Subtle paraseptal emphysema over the apices. Small calcified granuloma over the left lower lobe. Airways are normal. Musculoskeletal: No focal abnormality. Review of the MIP images confirms the above findings. CTA ABDOMEN AND PELVIS FINDINGS VASCULAR Aorta: Abdominal aorta demonstrates no evidence of aneurysm or dissection. There is moderate calcified plaque throughout the abdominal aorta. There is focal narrowing of the abdominal aorta at the bifurcation. Celiac: Minimal narrowing at the origin of the celiac axis which is otherwise patent.  SMA: Moderate calcified plaque at the origin of the superior mesenteric artery which is patent. Renals: Calcified plaque at the origin of the renal arteries bilaterally. Approximately 50% focal stenosis of the origin of the left renal artery with minimal poststenotic dilatation. Single bilateral small accessory renal arteries are also present. IMA: Patent. Inflow: Calcified plaque over the common iliac arteries. Internal and external iliac arteries are patent without focal stenosis or occlusion. Veins: No obvious venous abnormality within the limitations of this arterial phase study. Review of the MIP images confirms the above findings. NON-VASCULAR Hepatobiliary: Liver, gallbladder and biliary tree are normal. Pancreas: Normal. Spleen: Normal. Adrenals/Urinary Tract: Adrenal glands are normal. Kidneys are normal in size. Nonobstructing punctate stone over the lower pole right kidney. Couple small right renal cysts are present. There is moderate decreased perfusion of the lower pole of the right kidney on the arterial phase images with more streaky parenchymal hypodensities on the venous phase images suggesting infection/pyelonephritis. Minimal stranding of the adjacent perinephric fat. Bladder is unremarkable. Stomach/Bowel: Stomach and small bowel are normal. Appendix is not visualized. Mild fecal retention throughout the colon which is otherwise unremarkable. Lymphatic: No adenopathy. Reproductive: Unremarkable. Other: None. Musculoskeletal: No focal abnormality. Review of the MIP images confirms the above findings. IMPRESSION: 1. Changes involving the lower pole of the right kidney as described suggesting pyelonephritis. 2. No evidence of aneurysm or dissection involving the thoracoabdominal aorta. Focal narrowing of the abdominal aorta at the bifurcation. 3. Approximate 50% focal stenosis of the origin of the left renal artery. Mild narrowing at the origin of the celiac axis and superior mesenteric artery. 4.  Aortic atherosclerosis. 5. No acute findings in the chest. 6. Punctate nonobstructing right renal stone. Couple small right renal cysts. 7. Mild prominence of the ascending thoracic aorta measuring 3.5 cm. Recommend annual imaging followup by CTA or MRA. This recommendation follows 2010 ACCF/AHA/AATS/ACR/ASA/SCA/SCAI/SIR/STS/SVM Guidelines for the Diagnosis and Management of Patients with Thoracic Aortic Disease. Circulation.2010; 121: D638-V564. Aortic aneurysm NOS (ICD10-I71.9). Aortic Atherosclerosis (ICD10-I70.0). Electronically Signed   By: Marin Olp M.D.   On: 03/14/2020 14:55    Procedures Procedures   Medications Ordered in ED Medications  ondansetron (ZOFRAN) tablet 4 mg (4 mg Oral Given 03/15/20 1542)  HYDROmorphone (DILAUDID) injection 0.5 mg (0.5 mg Intravenous Given 03/15/20 1542)  labetalol (NORMODYNE) injection 10 mg (10 mg Intravenous Given 03/15/20 1542)  iohexol (OMNIPAQUE) 300 MG/ML solution 100 mL (100 mLs Intravenous Contrast Given 03/15/20 1647)  diphenhydrAMINE (BENADRYL) injection 25 mg (25 mg Intravenous Given 03/15/20 1705)    ED Course  I have reviewed the triage vital signs and the nursing  notes.  Pertinent labs & imaging results that were available during my care of the patient were reviewed by me and considered in my medical decision making (see chart for details).    MDM Rules/Calculators/A&P                          Mr. Jeanpaul is a 65 y.o. gentleman w/ PMHx polysubstance abuse and T2DM, presenting with diffuse abdominal tenderness with guarding and mild bilateral flank pain with chronic dysuria. He was just seen yesterday and CTA chest/abdomen/pelvis for dissection showed changes of the lower pole of the R kidney suggestive of pyelonephritis. Otherwise notable for 50% focal stenosis of the origin of the L renal artery and mild narrowing of the celiac axis and moderate narrowing of the SMA, and punctate nonobstructing R renal stone, although no overt  obstruction and no dissection or aneurysm. Patient was discharged with Keflex for 7 days, although urinalysis returned clean. Culture is pending.   Vital signs notable for severe hypertension concerning for hypertensive emergency / abdominal ischemia. Other possibilities include pancreatitis in setting of chronic alcohol use, diverticulitis. Pyelonephritis unlikely given normal urine.   Plan:   - Check CBC, CMP, lipase, lactic acid, repeat urinalysis - Check CT abdomen / pelvis with PO contrast  - Patient made NPO - Labetalol 67m IV for severe hypertension - Odansetron 462mPRN for nausea - Dilaudid 0.43m243mV PRN for pain   Results:   - Labs are notable for lipase 52, glucose 242 - Lactic acid 1.8 - CT abdomen/pelvis w/ PO contrast   CT abdomen/pelvis shows decreased perfusion of the lower pole of the R kidney, concerning for renal infarction. There is a small, non-obstructive right renal calculus.   Etiology of patient's acute abdominal pain is unclear. Discussed this with patient and reassured him of his unremarkable workup in the ED aside from his likely renal infarction. Informed him I will provide short course of Norco/Vicodin 5-3243m27m take every 4 hours as needed for severe pain. Encouraged him to avoid using NSAIDs too frequently due to risk of GI ulceration. Instructed him to follow up with PCP and provided contact information for pain management. Return precautions provided. Patient stable for discharge.   Final Clinical Impression(s) / ED Diagnoses Final diagnoses:  Generalized abdominal pain  Renal infarction (HCC)[place]    Rx / DC Orders ED Discharge Orders         Ordered    HYDROcodone-acetaminophen (NORCO/VICODIN) 5-325 MG tablet  Every 4 hours PRN        03/15/20 1735         RachJeralyn Bennett 03/15/2020, 6:12 PM Pager: [phone] Bennett 03/15/20 1812Pollyann Kennedy 03/17/20 [id number]

## 2020-03-15 NOTE — Discharge Instructions (Signed)
William Acevedo,   Your lab work returned relatively unremarkable, showing high glucose of 242, although without other acute findings. The CT scan of you abdomen/pelvis did show a region of your right kidney that may have been injured in the setting of decreased blood flow to it and did show a small kidney stone; however, your kidney stone is small and not causing obstruction; these are less likely the cause of your pain.   I will prescribe you Norco for pain. Please take 1 tablet every 4 hours as needed for severe pain. You should avoid taking NSAIDs such as Ibuprofen/Advil too frequently, as this can cause stomach troubles.   Please call to establish care through [facility] or follow up with your PCP (Dr. Elease Hashimoto) if you are following with her within the next 1-2 weeks for follow up of your pain. I have also included to number for [facility] to try reaching out to. Sometimes you need a direct referral from a PCP and Dr. Elease Hashimoto or [facility] can assist you with this.   Please return to the ED if you experience persistent severe pain, inability to tolerate oral intake, persistent fevers, chills or vomiting or any other concerning symptoms.   Dr. Laddie Aquas

## 2020-03-25 ENCOUNTER — Inpatient Hospital Stay (HOSPITAL_COMMUNITY)
Admission: EM | Admit: 2020-03-25 | Discharge: 2020-03-30 | DRG: 917 | Disposition: A | Discharge status: Home / Self Care | Payer: No Typology Code available for payment source | Attending: Internal Medicine | Admitting: Internal Medicine

## 2020-03-25 ENCOUNTER — Encounter (HOSPITAL_COMMUNITY): Payer: Self-pay | Admitting: *Deleted

## 2020-03-25 ENCOUNTER — Inpatient Hospital Stay (HOSPITAL_COMMUNITY): Payer: No Typology Code available for payment source

## 2020-03-25 ENCOUNTER — Other Ambulatory Visit: Payer: Self-pay

## 2020-03-25 ENCOUNTER — Emergency Department (HOSPITAL_COMMUNITY): Payer: No Typology Code available for payment source

## 2020-03-25 DIAGNOSIS — E1100 Type 2 diabetes mellitus with hyperosmolarity without nonketotic hyperglycemic-hyperosmolar coma (NKHHC): Secondary | ICD-10-CM | POA: Diagnosis present

## 2020-03-25 DIAGNOSIS — F1910 Other psychoactive substance abuse, uncomplicated: Secondary | ICD-10-CM | POA: Diagnosis not present

## 2020-03-25 DIAGNOSIS — R778 Other specified abnormalities of plasma proteins: Secondary | ICD-10-CM | POA: Diagnosis present

## 2020-03-25 DIAGNOSIS — I10 Essential (primary) hypertension: Secondary | ICD-10-CM | POA: Diagnosis present

## 2020-03-25 DIAGNOSIS — R260 Ataxic gait: Secondary | ICD-10-CM | POA: Diagnosis present

## 2020-03-25 DIAGNOSIS — E119 Type 2 diabetes mellitus without complications: Secondary | ICD-10-CM

## 2020-03-25 DIAGNOSIS — N280 Ischemia and infarction of kidney: Secondary | ICD-10-CM | POA: Diagnosis present

## 2020-03-25 DIAGNOSIS — N179 Acute kidney failure, unspecified: Secondary | ICD-10-CM | POA: Diagnosis present

## 2020-03-25 DIAGNOSIS — I214 Non-ST elevation (NSTEMI) myocardial infarction: Secondary | ICD-10-CM | POA: Diagnosis not present

## 2020-03-25 DIAGNOSIS — E43 Unspecified severe protein-calorie malnutrition: Secondary | ICD-10-CM | POA: Diagnosis present

## 2020-03-25 DIAGNOSIS — Z681 Body mass index (BMI) 19 or less, adult: Secondary | ICD-10-CM

## 2020-03-25 DIAGNOSIS — F17210 Nicotine dependence, cigarettes, uncomplicated: Secondary | ICD-10-CM | POA: Diagnosis present

## 2020-03-25 DIAGNOSIS — Z20822 Contact with and (suspected) exposure to covid-19: Secondary | ICD-10-CM | POA: Diagnosis present

## 2020-03-25 DIAGNOSIS — G8321 Monoplegia of upper limb affecting right dominant side: Secondary | ICD-10-CM | POA: Diagnosis present

## 2020-03-25 DIAGNOSIS — K5900 Constipation, unspecified: Secondary | ICD-10-CM | POA: Diagnosis present

## 2020-03-25 DIAGNOSIS — E875 Hyperkalemia: Secondary | ICD-10-CM | POA: Diagnosis present

## 2020-03-25 DIAGNOSIS — E1159 Type 2 diabetes mellitus with other circulatory complications: Secondary | ICD-10-CM

## 2020-03-25 DIAGNOSIS — I248 Other forms of acute ischemic heart disease: Secondary | ICD-10-CM | POA: Diagnosis present

## 2020-03-25 DIAGNOSIS — F1010 Alcohol abuse, uncomplicated: Secondary | ICD-10-CM | POA: Diagnosis present

## 2020-03-25 DIAGNOSIS — E1140 Type 2 diabetes mellitus with diabetic neuropathy, unspecified: Secondary | ICD-10-CM | POA: Diagnosis present

## 2020-03-25 DIAGNOSIS — F1490 Cocaine use, unspecified, uncomplicated: Secondary | ICD-10-CM | POA: Diagnosis present

## 2020-03-25 DIAGNOSIS — R471 Dysarthria and anarthria: Secondary | ICD-10-CM | POA: Diagnosis present

## 2020-03-25 DIAGNOSIS — R1013 Epigastric pain: Secondary | ICD-10-CM | POA: Diagnosis present

## 2020-03-25 DIAGNOSIS — E785 Hyperlipidemia, unspecified: Secondary | ICD-10-CM | POA: Diagnosis present

## 2020-03-25 DIAGNOSIS — Z9114 Patient's other noncompliance with medication regimen: Secondary | ICD-10-CM

## 2020-03-25 DIAGNOSIS — E1301 Other specified diabetes mellitus with hyperosmolarity with coma: Secondary | ICD-10-CM

## 2020-03-25 DIAGNOSIS — I639 Cerebral infarction, unspecified: Secondary | ICD-10-CM | POA: Diagnosis not present

## 2020-03-25 DIAGNOSIS — F1210 Cannabis abuse, uncomplicated: Secondary | ICD-10-CM | POA: Diagnosis present

## 2020-03-25 DIAGNOSIS — I6330 Cerebral infarction due to thrombosis of unspecified cerebral artery: Secondary | ICD-10-CM | POA: Insufficient documentation

## 2020-03-25 DIAGNOSIS — I6340 Cerebral infarction due to embolism of unspecified cerebral artery: Secondary | ICD-10-CM | POA: Diagnosis not present

## 2020-03-25 DIAGNOSIS — R29703 NIHSS score 3: Secondary | ICD-10-CM | POA: Diagnosis present

## 2020-03-25 DIAGNOSIS — F330 Major depressive disorder, recurrent, mild: Secondary | ICD-10-CM | POA: Diagnosis present

## 2020-03-25 DIAGNOSIS — F14229 Cocaine dependence with intoxication, unspecified: Secondary | ICD-10-CM | POA: Diagnosis present

## 2020-03-25 DIAGNOSIS — Q211 Atrial septal defect: Secondary | ICD-10-CM | POA: Diagnosis not present

## 2020-03-25 DIAGNOSIS — R202 Paresthesia of skin: Secondary | ICD-10-CM | POA: Diagnosis present

## 2020-03-25 DIAGNOSIS — I63343 Cerebral infarction due to thrombosis of bilateral cerebellar arteries: Secondary | ICD-10-CM | POA: Diagnosis present

## 2020-03-25 DIAGNOSIS — I701 Atherosclerosis of renal artery: Secondary | ICD-10-CM | POA: Diagnosis present

## 2020-03-25 DIAGNOSIS — I340 Nonrheumatic mitral (valve) insufficiency: Secondary | ICD-10-CM | POA: Diagnosis not present

## 2020-03-25 DIAGNOSIS — R7989 Other specified abnormal findings of blood chemistry: Secondary | ICD-10-CM | POA: Insufficient documentation

## 2020-03-25 DIAGNOSIS — G894 Chronic pain syndrome: Secondary | ICD-10-CM | POA: Diagnosis present

## 2020-03-25 DIAGNOSIS — Z794 Long term (current) use of insulin: Secondary | ICD-10-CM | POA: Diagnosis not present

## 2020-03-25 DIAGNOSIS — T405X1A Poisoning by cocaine, accidental (unintentional), initial encounter: Principal | ICD-10-CM | POA: Diagnosis present

## 2020-03-25 DIAGNOSIS — T383X6A Underdosing of insulin and oral hypoglycemic [antidiabetic] drugs, initial encounter: Secondary | ICD-10-CM | POA: Diagnosis present

## 2020-03-25 LAB — URINALYSIS, ROUTINE W REFLEX MICROSCOPIC
Bacteria, UA: NONE SEEN
Bilirubin Urine: NEGATIVE
Glucose, UA: >=500 mg/dL — AB
Hgb urine dipstick: NEGATIVE
Ketones, ur: 20 mg/dL — AB
Leukocytes,Ua: NEGATIVE
Nitrite: NEGATIVE
Protein, ur: 100 mg/dL — AB
Specific Gravity, Urine: 1.024 (ref 1.005–1.030)
pH: 6 (ref 5.0–8.0)

## 2020-03-25 LAB — I-STAT CHEM 8, ED
BUN: 23 mg/dL (ref 8–23)
Calcium, Ion: 1.11 mmol/L — ABNORMAL LOW (ref 1.15–1.40)
Chloride: 89 mmol/L — ABNORMAL LOW (ref 98–111)
Creatinine, Ser: 1.1 mg/dL (ref 0.61–1.24)
Glucose, Bld: 551 mg/dL (ref 70–99)
HCT: 52 % (ref 39.0–52.0)
Hemoglobin: 17.7 g/dL — ABNORMAL HIGH (ref 13.0–17.0)
Potassium: 4.1 mmol/L (ref 3.5–5.1)
Sodium: 130 mmol/L — ABNORMAL LOW (ref 135–145)
TCO2: 29 mmol/L (ref 22–32)

## 2020-03-25 LAB — CBC WITH DIFFERENTIAL/PLATELET
Abs Immature Granulocytes: 0.03 10*3/uL (ref 0.00–0.07)
Basophils Absolute: 0 10*3/uL (ref 0.0–0.1)
Basophils Relative: 0 %
Eosinophils Absolute: 0 10*3/uL (ref 0.0–0.5)
Eosinophils Relative: 0 %
HCT: 48.6 % (ref 39.0–52.0)
Hemoglobin: 16.1 g/dL (ref 13.0–17.0)
Immature Granulocytes: 0 %
Lymphocytes Relative: 15 %
Lymphs Abs: 1 10*3/uL (ref 0.7–4.0)
MCH: 28.8 pg (ref 26.0–34.0)
MCHC: 33.1 g/dL (ref 30.0–36.0)
MCV: 86.9 fL (ref 80.0–100.0)
Monocytes Absolute: 0.5 10*3/uL (ref 0.1–1.0)
Monocytes Relative: 7 %
Neutro Abs: 5.3 10*3/uL (ref 1.7–7.7)
Neutrophils Relative %: 78 %
Platelets: 243 10*3/uL (ref 150–400)
RBC: 5.59 MIL/uL (ref 4.22–5.81)
RDW: 11.9 % (ref 11.5–15.5)
WBC: 6.8 10*3/uL (ref 4.0–10.5)
nRBC: 0 % (ref 0.0–0.2)

## 2020-03-25 LAB — COMPREHENSIVE METABOLIC PANEL
ALT: 27 U/L (ref 0–44)
AST: 36 U/L (ref 15–41)
Albumin: 3.8 g/dL (ref 3.5–5.0)
Alkaline Phosphatase: 82 U/L (ref 38–126)
Anion gap: 14 (ref 5–15)
BUN: 23 mg/dL (ref 8–23)
CO2: 28 mmol/L (ref 22–32)
Calcium: 9.2 mg/dL (ref 8.9–10.3)
Chloride: 86 mmol/L — ABNORMAL LOW (ref 98–111)
Creatinine, Ser: 1.23 mg/dL (ref 0.61–1.24)
GFR, Estimated: >60 mL/min (ref >60)
Glucose, Bld: 539 mg/dL (ref 70–99)
Potassium: 4 mmol/L (ref 3.5–5.1)
Sodium: 128 mmol/L — ABNORMAL LOW (ref 135–145)
Total Bilirubin: 0.8 mg/dL (ref 0.3–1.2)
Total Protein: 8.1 g/dL (ref 6.5–8.1)

## 2020-03-25 LAB — RAPID URINE DRUG SCREEN, HOSP PERFORMED
Amphetamines: NOT DETECTED
Barbiturates: NOT DETECTED
Benzodiazepines: NOT DETECTED
Cocaine: POSITIVE — AB
Opiates: POSITIVE — AB
Tetrahydrocannabinol: POSITIVE — AB

## 2020-03-25 LAB — TROPONIN I (HIGH SENSITIVITY)
Troponin I (High Sensitivity): 656 ng/L (ref <18)
Troponin I (High Sensitivity): 642 ng/L (ref <18)

## 2020-03-25 LAB — BASIC METABOLIC PANEL
Anion gap: 10 (ref 5–15)
Anion gap: 7 (ref 5–15)
BUN: 21 mg/dL (ref 8–23)
BUN: 20 mg/dL (ref 8–23)
CO2: 28 mmol/L (ref 22–32)
CO2: 27 mmol/L (ref 22–32)
Calcium: 8.6 mg/dL — ABNORMAL LOW (ref 8.9–10.3)
Calcium: 8.2 mg/dL — ABNORMAL LOW (ref 8.9–10.3)
Chloride: 92 mmol/L — ABNORMAL LOW (ref 98–111)
Chloride: 96 mmol/L — ABNORMAL LOW (ref 98–111)
Creatinine, Ser: 1 mg/dL (ref 0.61–1.24)
Creatinine, Ser: 0.95 mg/dL (ref 0.61–1.24)
GFR, Estimated: >60 mL/min (ref >60)
GFR, Estimated: >60 mL/min (ref >60)
Glucose, Bld: 365 mg/dL — ABNORMAL HIGH (ref 70–99)
Glucose, Bld: 241 mg/dL — ABNORMAL HIGH (ref 70–99)
Potassium: 3.9 mmol/L (ref 3.5–5.1)
Potassium: 4 mmol/L (ref 3.5–5.1)
Sodium: 130 mmol/L — ABNORMAL LOW (ref 135–145)
Sodium: 130 mmol/L — ABNORMAL LOW (ref 135–145)

## 2020-03-25 LAB — CBG MONITORING, ED
Glucose-Capillary: 200 mg/dL — ABNORMAL HIGH (ref 70–99)
Glucose-Capillary: 278 mg/dL — ABNORMAL HIGH (ref 70–99)
Glucose-Capillary: 49 mg/dL — ABNORMAL LOW (ref 70–99)

## 2020-03-25 LAB — MAGNESIUM: Magnesium: 2.1 mg/dL (ref 1.7–2.4)

## 2020-03-25 LAB — RESP PANEL BY RT-PCR (FLU A&B, COVID) ARPGX2
Influenza A by PCR: NEGATIVE
Influenza B by PCR: NEGATIVE
SARS Coronavirus 2 by RT PCR: NEGATIVE

## 2020-03-25 LAB — LIPASE, BLOOD: Lipase: 26 U/L (ref 11–51)

## 2020-03-25 LAB — BLOOD GAS, VENOUS
Acid-Base Excess: 2.2 mmol/L — ABNORMAL HIGH (ref 0.0–2.0)
Bicarbonate: 27.5 mmol/L (ref 20.0–28.0)
O2 Saturation: 57.3 %
Patient temperature: 98.6
pCO2, Ven: 47.9 mmHg (ref 44.0–60.0)
pH, Ven: 7.378 (ref 7.250–7.430)
pO2, Ven: 34.7 mmHg (ref 32.0–45.0)

## 2020-03-25 LAB — PHOSPHORUS: Phosphorus: 2.8 mg/dL (ref 2.5–4.6)

## 2020-03-25 MED ORDER — ACETAMINOPHEN 650 MG RE SUPP: 650.0000 mg | Freq: Four times a day (QID) | RECTAL | Status: DC | PRN

## 2020-03-25 MED ORDER — DEXTROSE 50 % IV SOLN
50.0000 mL | Freq: Once | INTRAVENOUS | Status: AC
  Administered 2020-03-25: 50 mL via INTRAVENOUS
  Filled 2020-03-25: qty 50

## 2020-03-25 MED ORDER — LACTATED RINGERS IV SOLN: INTRAVENOUS | Status: DC

## 2020-03-25 MED ORDER — INSULIN ASPART 100 UNIT/ML ~~LOC~~ SOLN
0.0000 [IU] | SUBCUTANEOUS | Status: DC
  Administered 2020-03-25: 8 [IU] via SUBCUTANEOUS
  Administered 2020-03-25: 3 [IU] via SUBCUTANEOUS
  Filled 2020-03-25: qty 0.15

## 2020-03-25 MED ORDER — FOLIC ACID 1 MG PO TABS
1.0000 mg | ORAL_TABLET | Freq: Every day | ORAL | Status: DC
  Administered 2020-03-25 – 2020-03-30 (×6): 1 mg via ORAL
  Filled 2020-03-25 (×6): qty 1

## 2020-03-25 MED ORDER — INSULIN GLARGINE 100 UNIT/ML ~~LOC~~ SOLN
20.0000 [IU] | Freq: Every day | SUBCUTANEOUS | Status: DC
  Administered 2020-03-26 (×2): 20 [IU] via SUBCUTANEOUS
  Filled 2020-03-25 (×3): qty 0.2

## 2020-03-25 MED ORDER — THIAMINE HCL 100 MG PO TABS
100.0000 mg | ORAL_TABLET | Freq: Every day | ORAL | Status: DC
  Administered 2020-03-25 – 2020-03-30 (×6): 100 mg via ORAL
  Filled 2020-03-25 (×6): qty 1

## 2020-03-25 MED ORDER — DEXTROSE 50 % IV SOLN: 0.0000 mL | INTRAVENOUS | Status: DC | PRN

## 2020-03-25 MED ORDER — AMITRIPTYLINE HCL 10 MG PO TABS
20.0000 mg | ORAL_TABLET | Freq: Every day | ORAL | Status: DC
  Administered 2020-03-25 – 2020-03-29 (×5): 20 mg via ORAL
  Filled 2020-03-25 (×6): qty 2

## 2020-03-25 MED ORDER — LOSARTAN POTASSIUM 25 MG PO TABS
25.0000 mg | ORAL_TABLET | Freq: Every day | ORAL | Status: DC
  Administered 2020-03-26 – 2020-03-30 (×5): 25 mg via ORAL
  Filled 2020-03-25 (×5): qty 1

## 2020-03-25 MED ORDER — INSULIN ASPART 100 UNIT/ML ~~LOC~~ SOLN
5.0000 [IU] | Freq: Once | SUBCUTANEOUS | Status: AC
  Administered 2020-03-25: 5 [IU] via SUBCUTANEOUS
  Filled 2020-03-25: qty 0.05

## 2020-03-25 MED ORDER — SODIUM CHLORIDE 0.9 % IV BOLUS
1000.0000 mL | Freq: Once | INTRAVENOUS | Status: AC
  Administered 2020-03-25: 1000 mL via INTRAVENOUS

## 2020-03-25 MED ORDER — POTASSIUM CHLORIDE 10 MEQ/100ML IV SOLN: 10.0000 meq | INTRAVENOUS | Status: DC

## 2020-03-25 MED ORDER — ATORVASTATIN CALCIUM 10 MG PO TABS
20.0000 mg | ORAL_TABLET | Freq: Every day | ORAL | Status: DC
  Administered 2020-03-26: 20 mg via ORAL
  Filled 2020-03-25: qty 2

## 2020-03-25 MED ORDER — HEPARIN BOLUS VIA INFUSION
2000.0000 [IU] | Freq: Once | INTRAVENOUS | Status: AC
  Administered 2020-03-25: 2000 [IU] via INTRAVENOUS
  Filled 2020-03-25: qty 2000

## 2020-03-25 MED ORDER — HYDRALAZINE HCL 20 MG/ML IJ SOLN
10.0000 mg | Freq: Once | INTRAMUSCULAR | Status: AC
  Administered 2020-03-25: 10 mg via INTRAVENOUS
  Filled 2020-03-25: qty 1

## 2020-03-25 MED ORDER — ONDANSETRON HCL 4 MG/2ML IJ SOLN
4.0000 mg | Freq: Once | INTRAMUSCULAR | Status: AC
  Administered 2020-03-25: 4 mg via INTRAVENOUS
  Filled 2020-03-25: qty 2

## 2020-03-25 MED ORDER — HYDROMORPHONE HCL 1 MG/ML IJ SOLN
1.0000 mg | Freq: Once | INTRAMUSCULAR | Status: AC
Start: 2020-03-25 — End: 2020-03-25
  Administered 2020-03-25: 1 mg via INTRAVENOUS
  Filled 2020-03-25: qty 1

## 2020-03-25 MED ORDER — IOHEXOL 350 MG/ML SOLN
100.0000 mL | Freq: Once | INTRAVENOUS | Status: AC | PRN
  Administered 2020-03-25: 100 mL via INTRAVENOUS

## 2020-03-25 MED ORDER — ACETAMINOPHEN 325 MG PO TABS: 650.0000 mg | ORAL_TABLET | Freq: Four times a day (QID) | ORAL | Status: DC | PRN

## 2020-03-25 MED ORDER — GABAPENTIN 400 MG PO CAPS
1200.0000 mg | ORAL_CAPSULE | Freq: Every day | ORAL | Status: DC
  Administered 2020-03-25 – 2020-03-29 (×5): 1200 mg via ORAL
  Filled 2020-03-25 (×5): qty 3

## 2020-03-25 MED ORDER — IOHEXOL 350 MG/ML SOLN: 100.0000 mL | Freq: Once | INTRAVENOUS | Status: DC | PRN

## 2020-03-25 MED ORDER — SENNOSIDES-DOCUSATE SODIUM 8.6-50 MG PO TABS
1.0000 | ORAL_TABLET | Freq: Every evening | ORAL | Status: DC | PRN
  Administered 2020-03-28 – 2020-03-29 (×2): 1 via ORAL
  Filled 2020-03-25 (×2): qty 1

## 2020-03-25 MED ORDER — LIP MEDEX EX OINT
TOPICAL_OINTMENT | Freq: Once | CUTANEOUS | Status: AC
  Filled 2020-03-25: qty 7

## 2020-03-25 MED ORDER — DEXTROSE IN LACTATED RINGERS 5 % IV SOLN: INTRAVENOUS | Status: DC

## 2020-03-25 MED ORDER — INSULIN REGULAR(HUMAN) IN NACL 100-0.9 UT/100ML-% IV SOLN: INTRAVENOUS | Status: DC

## 2020-03-25 MED ORDER — LACTATED RINGERS IV BOLUS: 20.0000 mL/kg | Freq: Once | INTRAVENOUS | Status: DC

## 2020-03-25 MED ORDER — MELATONIN 3 MG PO TABS
12.0000 mg | ORAL_TABLET | Freq: Every day | ORAL | Status: DC
  Administered 2020-03-25 – 2020-03-29 (×5): 12 mg via ORAL
  Filled 2020-03-25 (×6): qty 4

## 2020-03-25 MED ORDER — ASPIRIN EC 81 MG PO TBEC
81.0000 mg | DELAYED_RELEASE_TABLET | Freq: Every day | ORAL | Status: DC
  Administered 2020-03-25 – 2020-03-30 (×6): 81 mg via ORAL
  Filled 2020-03-25 (×6): qty 1

## 2020-03-25 MED ORDER — HYDROCODONE-ACETAMINOPHEN 5-325 MG PO TABS
1.0000 | ORAL_TABLET | ORAL | Status: DC | PRN
  Administered 2020-03-25 – 2020-03-26 (×2): 1 via ORAL
  Filled 2020-03-25 (×2): qty 1

## 2020-03-25 MED ORDER — FAMOTIDINE IN NACL 20-0.9 MG/50ML-% IV SOLN
20.0000 mg | INTRAVENOUS | Status: DC
  Administered 2020-03-26 – 2020-03-27 (×2): 20 mg via INTRAVENOUS
  Filled 2020-03-25 (×3): qty 50

## 2020-03-25 MED ORDER — METOPROLOL TARTRATE 5 MG/5ML IV SOLN
5.0000 mg | Freq: Four times a day (QID) | INTRAVENOUS | Status: DC | PRN
Start: 2020-03-25 — End: 2020-03-30

## 2020-03-25 MED ORDER — HEPARIN (PORCINE) 25000 UT/250ML-% IV SOLN
900.0000 [IU]/h | INTRAVENOUS | Status: DC
  Administered 2020-03-25: 650 [IU]/h via INTRAVENOUS
  Administered 2020-03-26: 950 [IU]/h via INTRAVENOUS
  Filled 2020-03-25 (×2): qty 250

## 2020-03-25 MED ORDER — LOSARTAN POTASSIUM 25 MG PO TABS: 25.0000 mg | ORAL_TABLET | Freq: Every day | ORAL | Status: DC

## 2020-03-25 NOTE — ED Triage Notes (Signed)
Pt complains of abdominal pain and vomiting x 2 weeks. No diarrhea.

## 2020-03-25 NOTE — ED Notes (Signed)
William Acevedo, EDP made aware of glucose of 539 and troponin of 656.

## 2020-03-25 NOTE — ED Notes (Addendum)
Patient transported to MRI 

## 2020-03-25 NOTE — Consult Note (Addendum)
[facility] Telemedicine Consult   Requesting Provider: Dr. Jonathon BellowsKc  Chief Complaint: right hand weakness and ataxia  HPI: 65 yo M with Hx HTN, DM, HLD, polysubstance abuse presented to ER for abdominal pain. He has been here on 2/26 and 2/27 for the same and found to have right renal infarct and b/l renal artery stenosis. Today he also found to have elevated troponin, cardiology consulted and concerning for NSTEMI and requested heparin IV. His BP was high at 200s and glucose 586 on presentation.   In addition, he stated that he has had right hand weakness since Sunday. He kept dropping things from his right hand without notice. Yesterday 6-7pm, he was watching TV, found that he could not control his right arm well, incoordinated. He also has nausea and hiccup for the last 2-3 days. Feeling lightheadedness and imbalance on walking, denies numbness, tingling, vision changes. Currently BP 142/96. CT head showed chronic left occipital infarct.   He has hx of DM and HTN, not compliant with medication. He uses cocaine, last use was yesterday. He also use THC. Smoking 4 cigarettes a day. Drinks 1-6 drinks per day, variable.   LKW: 3 days ago tpa given?: No, outside window IR Thrombectomy? No, outside window, low NIH Modified Rankin Scale: 1-No significant post stroke disability and can perform usual duties with stroke symptoms   Exam: Vitals:   03/25/20 1845 03/25/20 1945  BP: 123/84 131/72  Pulse: (!) 101 100  Resp: 17 17  Temp:    SpO2: 98% 99%     Temp:  [97.5 F (36.4 C)] 97.5 F (36.4 C) (03/09 1354) Pulse Rate:  [100-116] 100 (03/09 1945) Resp:  [12-21] 17 (03/09 1945) BP: (100-225)/(50-136) 131/72 (03/09 1945) SpO2:  [97 %-100 %] 99 % (03/09 1945)  General - Well nourished, well developed, in no apparent distress.  Ophthalmologic - fundi not visualized due to noncooperation.  Cardiovascular - Regular rhythm and rate.  Neuro - awake, alert, eyes open, orientated to  age, place, time and people. No aphasia, fluent language, following all simple commands. Able to name and repeat and read. Mild dysarthria due to poor denture. No gaze palsy, tracking bilaterally, visual field full. No facial droop. Tongue midline. Bilateral UEs 5/5, no drift. Bilaterally LEs 5/5, no drift. Sensation symmetrical bilaterally, right FTN dysmetria and HTS ataxia, gait not tested. Frequent hiccup.    NIH Stroke Scale  Level Of Consciousness 0=Alert; keenly responsive 1=Arouse to minor stimulation 2=Requires repeated stimulation to arouse or movements to pain 3=postures or unresponsive 0  LOC Questions to Month and Age 14=Answers both questions correctly 1=Answers one question correctly or dysarthria/intubated/trauma/language barrier 2=Answers neither question correctly or aphasia 0  LOC Commands      -Open/Close eyes     -Open/close grip     -Pantomime commands if communication barrier 0=Performs both tasks correctly 1=Performs one task correctly 2=Performs neighter task correctly 0  Best Gaze     -Only assess horizontal gaze 0=Normal 1=Partial gaze palsy 2=Forced deviation, or total gaze paresis 0  Visual 0=No visual loss 1=Partial hemianopia 2=Complete hemianopia 3=Bilateral hemianopia (blind including cortical blindness) 0  Facial Palsy     -Use grimace if obtunded 0=Normal symmetrical movement 1=Minor paralysis (asymmetry) 2=Partial paralysis (lower face) 3=Complete paralysis (upper and lower face) 0  Motor  0=No drift for 10/5 seconds 1=Drift, but does not hit bed 2=Some antigravity effort, hits  bed 3=No effort against gravity, limb falls 4=No movement 0=Amputation/joint fusion Right Arm 0  Leg 0    Left Arm 0     Leg 0  Limb Ataxia     - FNT/HTS 0=Absent or does not understand or paralyzed or amputation/joint fusion 1=Present in one limb 2=Present in two limbs 2  Sensory 0=Normal 1=Mild to moderate sensory loss 2=Severe to total sensory loss or  coma/unresponsive 0  Best Language 0=No aphasia, normal 1=Mild to moderate aphasia 2=Severe aphasia 3=Mute, global aphasia, or coma/unresponsive 0  Dysarthria 0=Normal 1=Mild to moderate 2=Severe, unintelligible or mute/anarthric 0=intubated/unable to test 1  Extinction/Neglect 0=No abnormality 1=visual/tactile/auditory/spatia/personal inattention/Extinction to bilateral simultaneous stimulation 2=Profound neglect/extinction more than 1 modality  0  Total   3      Imaging Reviewed:  CT Head Wo Contrast  Result Date: 03/25/2020 CLINICAL DATA:  Right-sided weakness. EXAM: CT HEAD WITHOUT CONTRAST TECHNIQUE: Contiguous axial images were obtained from the base of the skull through the vertex without intravenous contrast. COMPARISON:  April 06, 2019 and January 18, 2019 FINDINGS: Brain: No evidence of acute infarction, hemorrhage, hydrocephalus, extra-axial collection or mass lesion/mass effect. A small, stable area of white matter low attenuation is seen within the left occipital lobe. There is no evidence of associated mass effect or midline shift. While this is stable in appearance when compared to the prior plain brain CT, dated April 06, 2019, it does represent a new finding when compared to the earlier exam from January 18, 2019. Vascular: No hyperdense vessel or unexpected calcification. Skull: A chronic left-sided nasal bone fracture is seen. Sinuses/Orbits: No acute finding. Other: Mild left posterior parietal scalp soft tissue swelling is seen. IMPRESSION: 1. Findings suggestive of a small, stable chronic left occipital lobe infarct. MRI correlation is recommended. 2. No acute intracranial abnormality. Electronically Signed   By: Aram Candela M.D.   On: 03/25/2020 19:27   CT Angio Chest/Abd/Pel for Dissection W and/or Wo Contrast  Result Date: 03/25/2020 CLINICAL DATA:  Abdominal pain, concern for aortic dissection EXAM: CT ANGIOGRAPHY CHEST, ABDOMEN AND PELVIS TECHNIQUE: Non-contrast  CT of the chest was initially obtained. Multidetector CT imaging through the chest, abdomen and pelvis was performed using the standard protocol during bolus administration of intravenous contrast. Multiplanar reconstructed images and MIPs were obtained and reviewed to evaluate the vascular anatomy. CONTRAST:  OMNIPAQUE IOHEXOL 350 MG/ML SOLN COMPARISON:  03/14/2020 FINDINGS: CTA CHEST FINDINGS Cardiovascular: Stable caliber of the thoracic aorta with atherosclerotic plaque. No evidence of intramural hematoma or dissection. Noncalcified plaque along the proximal left subclavian causes 50% stenosis. No evidence of central pulmonary embolus. Normal heart size. Coronary artery calcification. No pericardial effusion. Mediastinum/Nodes: No enlarged lymph nodes identified. Thyroid and esophagus are unremarkable. Lungs/Pleura: Mild paraseptal emphysema. No new consolidation. No pleural effusion or pneumothorax. Musculoskeletal: No acute osseous abnormality. Review of the MIP images confirms the above findings. CTA ABDOMEN AND PELVIS FINDINGS VASCULAR Aorta: Normal in caliber. Diffuse mixed but primarily calcified plaque. No evidence of dissection. As before, there is marked plaque at the bifurcation with luminal narrowing. Celiac: Patent origin with stable minimal narrowing. SMA: Patent origin with stable mild narrowing. Renals: Patent with stable at least moderate narrowing of the left renal artery origin. Patent small bilateral accessory renal arteries. The inferior right accessory renal artery is small in caliber with likely significant origin stenosis. IMA: Patent with stable moderate to marked stenosis at the origin. Inflow: Patent common iliacs with normal caliber and mixed but primarily calcified plaque. Patent internal and external iliacs. Veins: Not well evaluated. Review of the MIP images confirms  the above findings. NON-VASCULAR Hepatobiliary: No significant abnormality. Pancreas: Unremarkable. Spleen:  Unremarkable Adrenals/Urinary Tract: Adrenals are unremarkable. There is persistent hypoenhancement of the lower pole of the right kidney. Additional small areas of hypoenhancement are present within the cortex in the interpolar region. Punctate nonobstructing calculus of the interpolar right kidney. Bladder is unremarkable. Stomach/Bowel: Stomach is within normal limits. Bowel is normal in caliber. Paucity of mesenteric fat somewhat limits evaluation. Lymphatic: No enlarged lymph nodes identified. Reproductive: Unremarkable. Other: No ascites.  Abdominal wall is unremarkable. Musculoskeletal: No acute osseous abnormality. Review of the MIP images confirms the above findings. IMPRESSION: No substantial change since 03/14/2020. There is no evidence of aortic dissection. Significant atherosclerosis throughout. As before, there is stenosis at the abdominal aorta branch origins and bifurcation. Hypoperfusion of the inferior right kidney likely with some areas of infarction as before. There is a small caliber accessory right renal artery primarily supplying the lower pole with high-grade origin stenosis. Electronically Signed   By: Guadlupe Spanish M.D.   On: 03/25/2020 16:41    Assessment:  65 yo M with Hx HTN, DM, HLD, polysubstance abuse (cocaine and THC), smoker, alcohol abuse presented to ER for abdominal pain for the 3rd time in the last 2 weeks. Found to have right renal infarct and b/l renal artery stenosis. Today he also found to have elevated troponin, cardiology consulted and concerning for NSTEMI and requested heparin IV. His BP was high at 200s and glucose 586 on presentation.   He also complained right hand weakness and right arm ataxia, imbalance on walking and hiccup. CT head showed chronic left occipital infarct. Given his ataxia on the right with hiccup, concerning for lateral medullary infarct. He also has renal infarct, concerning for cardioembolic source. However, he does have multiple  uncontrolled risk factors. MRI pending. Also recommend CTA head and neck. OK with heparin IV and ASA 81 per cardiology.   Recommendations:  - Continue further stroke work up  - Frequent neuro checks - Telemetry monitoring - MRI brain pending - CTA head and neck - Echocardiogram  - UDS, fasting lipid panel and HgbA1C - PT/OT consult - OK with heparin IV and ASA 81 per cardiology for NSTEMI - Will follow   Consult Participants: Dr Roda Shutters and Lucien Mons RNs Location of the provider: home office Location of the patient: [facility]  This consult was provided via telemedicine with 2-way video and audio communication. The patient/family was informed that care would be provided in this way and agreed to receive care in this manner.   This patient is receiving care for possible acute neurological changes. There was 50 minutes of care by this provider at the time of service, including time for direct evaluation via telemedicine, review of medical records, imaging studies and discussion of findings with providers, the patient and/or family.  Marvel Plan, MD PhD Stroke Neurology 03/25/2020 8:35 PM  ADDENDUM: MRI done showed multifocal small or punctate acute and subacute infarcts involving right cerebellum, right ACA and MCA/PCA and b/l MCA/ACA regions, no hemorrhagic conversion. Definitely cardioembolic pattern. Along with his renal infarct, this is concerning for LV thrombus, endocarditis, or RV thrombus with PFO.   Given the small size of stroke and subacute appearance, OK with heparin IV and ASA 81 at this time per cardiology. Continue other stroke work up including CTA head and neck as well as TTE. May consider TEE if warranted. Since pt had CTA C/A/P today, OK to perform CTA head and neck tomorrow, it will not  change management at this time. Discussed with EDP Dr. Silverio Lay.   Marvel Plan, MD PhD Stroke Neurology 03/25/2020 10:36 PM

## 2020-03-25 NOTE — ED Notes (Signed)
Called for patient for MRI@19 :21PM.

## 2020-03-25 NOTE — Progress Notes (Signed)
ANTICOAGULATION CONSULT NOTE  Pharmacy Consult for IV heparin Indication: chest pain/ACS  Allergies  Allergen Reactions  . Trazodone     Other reaction(s): Priapism  . Lisinopril Swelling    angioedema  . Omeprazole     Other reaction(s): ANGIOEDEMA OF LIPS, Angioedema of tongue.    Patient Measurements:   Heparin Dosing Weight: TBW  Vital Signs: Temp: 97.5 F (36.4 C) (03/09 1354) Temp Source: Oral (03/09 1354) BP: 106/56 (03/09 1653) Pulse Rate: 105 (03/09 1653)  Labs: Recent Labs    03/25/20 1420 03/25/20 1446  HGB 16.1 17.7*  HCT 48.6 52.0  PLT 243  --   CREATININE 1.23 1.10  TROPONINIHS 656*  --     Estimated Creatinine Clearance: 50 mL/min (by C-G formula based on SCr of 1.1 mg/dL).   Medical History: Past Medical History:  Diagnosis Date  . Carpal tunnel syndrome   . Depression   . Diabetes mellitus without complication (HCC)   . Neuropathy     Medications:  (Not in a hospital admission)  Scheduled:  . heparin  2,000 Units Intravenous Once   Infusions:  . heparin     Assessment: 89 yoM with PMH DM2, HTN, subst abuse, admitted 3/9 with abdominal pain and n/v for over a week. CT from ED visit 2 wks ago showed pyelo vs renal infarct. Now found to have hyperglycemia, and hypertension, with elevated troponins. Renal infarct still present on CT. Cardiology consulted for troponins; requesting heparin per Pharmacy for NSTEMI treatment.   Baseline INR, aPTT: not done  Prior anticoagulation: none  Significant events:  Today, 03/25/2020:  CBC: Hgb elevated, Plt wnl  No bleeding or infusion issues per nursing  Goal of Therapy: Heparin level 0.3-0.7 units/ml Monitor platelets by anticoagulation protocol: Yes  Plan:  Heparin 2000 units IV bolus x 1  Heparin 650 units/hr IV infusion  Check heparin level 6 hrs after start  Daily CBC, daily heparin level once stable  Monitor for signs of bleeding or thrombosis  Bernadene Person, PharmD,  BCPS [phone] 03/25/2020, 6:14 PM

## 2020-03-25 NOTE — H&P (Signed)
History and Physical    William Acevedo KNL:[id number] DOB: 1955/06/21 DOA: 03/25/2020  PCP: Olegario Messier, MD   Patient coming from: Home Chief Complaint  Patient presents with  . Abdominal Pain     HPI: William Acevedo is a 65 y.o. male with medical history significant for diabetes mellitus on long-term insulin noncompliant, hypertension, hyperlipidemia , Major depressive disorder, polysubstance abuse with cocaine comes to the ED with abdominal pain vomiting, epigastric pain for last several weeks.  He was seen in the ED 2 weeks ago and had 2 CT scan done that showed possible renal infarct versus pyelonephritis but his EF was normal, sent home on Vicodin. He returned to the ED with abdominal pain and vomiting. He underwent further work-up with elevated troponin 650, significantly high glucose 500-600, hyponatremia stable renal function underwent CT dissection is studies negative for dissection but he still has a renal infarct, no obvious ST elevation MI on EKG, urine drug screen is positive for cocaine, cannabinoid and opiates.  His blood pressure initially was in 200, IV hydralazine was given and blood pressure was Daylene 106.  Cardiology Dr Claiborne Billings was consulted troponin leak from cocaine versus ongoing hypotension, advised to start non-ST elevation treatment with heparin and admit under TRH.  Received 1 L bolus and 5 units of insulin.He c/o of shortness for breath hiccups, vomiting, chills, dysurea but no fever, headache, focal weakness.    COVID-19 negative.  Admission is requested to [facility] per cardiology, more iv fluid bolus and IV insulin drip is being initiated with plan While in ER he told he he has noticed object falling from hand around 1 pm/Weak grip on right hand still present but no other focal weakness of speech or swallowing difficulty.  Review of Systems: All systems were reviewed and were negative except as mentioned in HPI above. Negative for fever Negative for focal  weakness  Past Medical History:  Diagnosis Date  . Carpal tunnel syndrome   . Depression   . Diabetes mellitus without complication ([place])   . Neuropathy     Past Surgical History:  Procedure Laterality Date  . TESTICLE TORSION REDUCTION       reports that he has been smoking cigarettes. He has been smoking about 0.50 packs per day. He has never used smokeless tobacco. He reports current alcohol use. He reports current drug use. Drug: Cocaine.  Allergies  Allergen Reactions  . Trazodone     Other reaction(s): Priapism  . Lisinopril Swelling    angioedema  . Omeprazole     Other reaction(s): ANGIOEDEMA OF LIPS, Angioedema of tongue.   Family History Lung disease and alcoholism in father  Prior to Admission medications   Medication Sig Start Date End Date Taking? Authorizing Provider  amitriptyline (ELAVIL) 10 MG tablet Take 20 mg by mouth at bedtime.   Yes [provider]  atorvastatin (LIPITOR) 20 MG tablet Take 20 mg by mouth daily.   Yes [provider]  empagliflozin (JARDIANCE) 25 MG TABS tablet Take 25 mg by mouth daily.   Yes [provider]  gabapentin (NEURONTIN) 600 MG tablet Take 1,200 mg by mouth at bedtime.   Yes [provider]  glipiZIDE (GLUCOTROL) 10 MG tablet Take 1 tablet (10 mg total) by mouth daily before breakfast. Patient taking differently: Take 10 mg by mouth daily. 08/24/19  Yes [name], PA-C  HYDROcodone-acetaminophen (NORCO/VICODIN) 5-325 MG tablet Take 1 tablet by mouth every 4 (four) hours as needed for severe  pain. 03/15/20  Yes [name], MD  insulin aspart (NOVOLOG FLEXPEN) 100 UNIT/ML FlexPen Inject 5 Units into the skin 3 (three) times daily with meals. 04/08/19  Yes [name], MD  insulin glargine (LANTUS) 100 UNIT/ML Solostar Pen Inject 40 Units into the skin daily. 08/24/19  Yes [name], PA-C  blood glucose meter kit and supplies KIT Dispense based on patient and insurance  preference. Use up to four times daily as directed. (FOR ICD-9 250.00, 250.01). 09/13/19   Isla Pence, MD  cyclobenzaprine (FLEXERIL) 10 MG tablet Take 1 tablet (10 mg total) by mouth 2 (two) times daily as needed for muscle spasms. Patient not taking: No sig reported 01/18/19   Tegeler, Gwenyth Allegra, MD  FLUoxetine (PROZAC) 20 MG capsule Take 1 capsule (20 mg total) by mouth daily. Patient not taking: No sig reported 04/09/19   [name], MD  folic acid (FOLVITE) 1 MG tablet Take 1 tablet (1 mg total) by mouth daily. Patient not taking: Reported on 03/25/2020 04/08/19   [name], MD  gabapentin (NEURONTIN) 100 MG capsule Take 1 capsule (100 mg total) by mouth 3 (three) times daily. Patient not taking: No sig reported 12/02/13   Muthersbaugh, [name], PA-C  ibuprofen (ADVIL) 800 MG tablet Take 1 tablet (800 mg total) by mouth every 8 (eight) hours as needed. Patient not taking: No sig reported 09/21/18   [name], PA-C  Insulin Pen Needle 29G X 12MM MISC Per instructions 04/08/19   [name], MD  ketoconazole (NIZORAL) 2 % cream Apply 1 fingertip amount to each foot daily. Patient not taking: No sig reported 08/10/17   [name], DPM  losartan (COZAAR) 25 MG tablet Take 25 mg by mouth daily.    [provider]  melatonin 3 MG TABS tablet Take 12 mg by mouth at bedtime.    [provider]  predniSONE (DELTASONE) 20 MG tablet Take 2 tablets (40 mg total) by mouth daily with breakfast. Patient not taking: No sig reported 04/09/19   [name], MD  tadalafil (CIALIS) 10 MG tablet Take 10 mg by mouth daily as needed for erectile dysfunction.    [provider]  thiamine 100 MG tablet Take 1 tablet (100 mg total) by mouth daily. Patient not taking: No sig reported 04/09/19   [name], MD  traMADol (ULTRAM) 50 MG tablet Take 1 tablet (50 mg total) by mouth every 6 (six) hours as needed for severe pain. Patient not taking: No sig  reported 09/21/18   [name], PA-C    Physical Exam: Vitals:   03/25/20 1445 03/25/20 1535 03/25/20 1545 03/25/20 1653  BP: (!) 196/101 110/80 119/72 (!) 106/56  Pulse: (!) 103 (!) 108 (!) 107 (!) 105  Resp: _0 Temp:      TempSrc:      SpO2: 99% 100% 99% 100%    General exam: AAOx3, thin,NAD, weak appearing. HEENT:Oral mucosa moist, Ear/Nose WNL grossly, dentition normal. Respiratory system: bilaterally clear,no wheezing or crackles,no use of accessory muscle Cardiovascular system: S1 & S2 +, No JVD,. Gastrointestinal system: Abdomen soft, mild diffuse tenderness,ND, BS+ Nervous System:Alert, awake, moving extremities and grossly nonfocal. Has weak Right hand grip Extremities: No edema, distal peripheral pulses palpable.  Skin: No rashes,no icterus. MSK: Normal muscle bulk,tone, power   Labs on Admission: I have personally reviewed following labs and imaging studies  CBC: Recent Labs  Lab 03/25/20 1420 03/25/20 1446  WBC  6.8  --   NEUTROABS 5.3  --   HGB 16.1 17.7*  HCT 48.6 52.0  MCV 86.9  --   PLT 243  --    Basic Metabolic Panel: Recent Labs  Lab 03/25/20 1420 03/25/20 1446  NA 128* 130*  K 4.0 4.1  CL 86* 89*  CO2 28  --   GLUCOSE 539* 551*  BUN 23 23  CREATININE 1.23 1.10  CALCIUM 9.2  --    GFR: Estimated Creatinine Clearance: 50 mL/min (by C-G formula based on SCr of 1.1 mg/dL). Liver Function Tests: Recent Labs  Lab 03/25/20 1420  AST 36  ALT 27  ALKPHOS 82  BILITOT 0.8  PROT 8.1  ALBUMIN 3.8   Recent Labs  Lab 03/25/20 1420  LIPASE 26   No results for input(s): AMMONIA in the last 168 hours. Coagulation Profile: No results for input(s): INR, PROTIME in the last 168 hours. Cardiac Enzymes: No results for input(s): CKTOTAL, CKMB, CKMBINDEX, TROPONINI in the last 168 hours. BNP (last 3 results) No results for input(s): PROBNP in the last 8760 hours. HbA1C: No results for input(s): HGBA1C in the last 72  hours. CBG: Recent Labs  Lab 03/25/20 1804  GLUCAP 278*   Lipid Profile: No results for input(s): CHOL, HDL, LDLCALC, TRIG, CHOLHDL, LDLDIRECT in the last 72 hours. Thyroid Function Tests: No results for input(s): TSH, T4TOTAL, FREET4, T3FREE, THYROIDAB in the last 72 hours. Anemia Panel: No results for input(s): VITAMINB12, FOLATE, FERRITIN, TIBC, IRON, RETICCTPCT in the last 72 hours. Urine analysis:    Component Value Date/Time   COLORURINE STRAW (A) 03/25/2020 1545   APPEARANCEUR CLEAR 03/25/2020 1545   LABSPEC 1.024 03/25/2020 1545   PHURINE 6.0 03/25/2020 1545   GLUCOSEU >=500 (A) 03/25/2020 1545   HGBUR NEGATIVE 03/25/2020 1545   BILIRUBINUR NEGATIVE 03/25/2020 1545   KETONESUR 20 (A) 03/25/2020 1545   PROTEINUR 100 (A) 03/25/2020 1545   NITRITE NEGATIVE 03/25/2020 1545   LEUKOCYTESUR NEGATIVE 03/25/2020 1545    Radiological Exams on Admission: CT Angio Chest/Abd/Pel for Dissection W and/or Wo Contrast  Result Date: 03/25/2020 CLINICAL DATA:  Abdominal pain, concern for aortic dissection EXAM: CT ANGIOGRAPHY CHEST, ABDOMEN AND PELVIS TECHNIQUE: Non-contrast CT of the chest was initially obtained. Multidetector CT imaging through the chest, abdomen and pelvis was performed using the standard protocol during bolus administration of intravenous contrast. Multiplanar reconstructed images and MIPs were obtained and reviewed to evaluate the vascular anatomy. CONTRAST:  122m OMNIPAQUE IOHEXOL 350 MG/ML SOLN COMPARISON:  03/14/2020 FINDINGS: CTA CHEST FINDINGS Cardiovascular: Stable caliber of the thoracic aorta with atherosclerotic plaque. No evidence of intramural hematoma or dissection. Noncalcified plaque along the proximal left subclavian causes 50% stenosis. No evidence of central pulmonary embolus. Normal heart size. Coronary artery calcification. No pericardial effusion. Mediastinum/Nodes: No enlarged lymph nodes identified. Thyroid and esophagus are unremarkable. Lungs/Pleura:  Mild paraseptal emphysema. No new consolidation. No pleural effusion or pneumothorax. Musculoskeletal: No acute osseous abnormality. Review of the MIP images confirms the above findings. CTA ABDOMEN AND PELVIS FINDINGS VASCULAR Aorta: Normal in caliber. Diffuse mixed but primarily calcified plaque. No evidence of dissection. As before, there is marked plaque at the bifurcation with luminal narrowing. Celiac: Patent origin with stable minimal narrowing. SMA: Patent origin with stable mild narrowing. Renals: Patent with stable at least moderate narrowing of the left renal artery origin. Patent small bilateral accessory renal arteries. The inferior right accessory renal artery is small in caliber with likely significant origin stenosis. IMA: Patent with  stable moderate to marked stenosis at the origin. Inflow: Patent common iliacs with normal caliber and mixed but primarily calcified plaque. Patent internal and external iliacs. Veins: Not well evaluated. Review of the MIP images confirms the above findings. NON-VASCULAR Hepatobiliary: No significant abnormality. Pancreas: Unremarkable. Spleen: Unremarkable Adrenals/Urinary Tract: Adrenals are unremarkable. There is persistent hypoenhancement of the lower pole of the right kidney. Additional small areas of hypoenhancement are present within the cortex in the interpolar region. Punctate nonobstructing calculus of the interpolar right kidney. Bladder is unremarkable. Stomach/Bowel: Stomach is within normal limits. Bowel is normal in caliber. Paucity of mesenteric fat somewhat limits evaluation. Lymphatic: No enlarged lymph nodes identified. Reproductive: Unremarkable. Other: No ascites.  Abdominal wall is unremarkable. Musculoskeletal: No acute osseous abnormality. Review of the MIP images confirms the above findings. IMPRESSION: No substantial change since 03/14/2020. There is no evidence of aortic dissection. Significant atherosclerosis throughout. As before, there is  stenosis at the abdominal aorta branch origins and bifurcation. Hypoperfusion of the inferior right kidney likely with some areas of infarction as before. There is a small caliber accessory right renal artery primarily supplying the lower pole with high-grade origin stenosis. Electronically Signed   By: Macy Mis M.D.   On: 03/25/2020 16:41     Assessment/Plan  Elevated troponin concerning for NSTEMI: Cardiology is already consulted and as per recommendation getting admitted with IV heparin, serial troponin, echocardiogram. Add asa 81 mg lipitor, home losartan.  Check lipid panel in a.m,HbA1c. Npo past MN pending cardio eval.  Uncontrolled hyperglycemia with nonketotic hyperosmolar state, normal anion gap Diabetes mellitus on long-term insulin on(Lantus 14 u and NovoLog 5 units TID,jardiance, glipizide-noncompliant) We will continue with aggressive IV hydration and was about to be started on insulin drip bus cbtg came at 278- we will cont on SSI q6hr and lantus at half of home dose. Check magnesium, phosphorus.Hold home meds, check HbA1c.  Falling object from hand noticed at 1 pm/Weal grip on right hand:he reports credit card fell from his hand and could not tie his shoes and ?involuntray movements of hand. It happened at 1 pm tday. ?CVA. I discussed with EDP- consulting neuro and getting CThead , MRI brain, further w/u as per neuro. Add neurocheck, echo, hba1c, lipid panel, swallow screen.  Essential hypertension: Blood pressure uncontrolled initially into 100 subsequently down to 100 with IV hydralazine.  Caution with precipitous drop in blood pressure.  Continue IV fluid hydration. Cont home loe dose losartan.  Abdomen pain/nuasea vomting Possible  Renal infarct on CTA; ct angio chestabd pelvis reviewde in ER (see full report) check UA- hemoglobin dipstick negative RBC 0-5.AST/ALT CLD, prn zofran, pepcid.  Hyperlipidemia: Start statin  Major depressive disorder polysubstance abuse with  cocaine, UDS positive for opiate cannabinoid and cocaine Counseling, TOC consult and regular outpatient follow-up with a psychiatrist.  He follows up with [facility]. Resume home amitriptyline, Neurontin, bedtime melatonin. Occasional tobacco use and alochol use- cessation advised  There is no height or weight on file to calculate BMI.   Severity of Illness: * I certify that at the point of admission it is my clinical judgment that the patient will require inpatient hospital care spanning beyond 2 midnights from the point of admission due to high intensity of service, high risk for further deterioration and high frequency of surveillance required due to non-ST elevation MI, uncontrolled hyperglycemia needing insulin drip.    DVT prophylaxis: heparin bolus via infusion 2,000 Units Start: 03/25/20 1730 Code Status:   Code Status: Full Code  Family Communication: Admission, patients condition and plan of care including tests being ordered have been discussed with the patient  who indicate understanding and agree with the plan and Code Status.  Consults called:  [facility] consulted In ED  Antonieta Pert MD [facility]  If 7PM-7AM, please contact night-coverage [email or web address]  03/25/2020, 6:24 PM

## 2020-03-25 NOTE — ED Notes (Signed)
Patient asking for water and ginger ale.  Per Silverio Lay, EDP, patient can have water and diet ginger ale.

## 2020-03-25 NOTE — ED Provider Notes (Addendum)
[facility] DEPT Provider Note   CSN: [id number] Arrival date & time: 03/25/20  1344     History Chief Complaint  Patient presents with  . Abdominal Pain    William Acevedo is a 65 y.o. male history of diabetes, depression, here presenting with abdominal pain.  Patient had some epigastric pain for the last several weeks.  Patient came here 2 weeks ago and had 2 CTs done that showed possible renal infarct.  He initially had a CT dissection study that showed that and the following day he had a CT abdomen pelvis showed pyelo versus renal infarct.  However it was not a dedicated study for renal artery stenosis or renal vein thrombosis.  Patient states that he has persistent pain and started vomiting today.  He is given Vicodin which helps control his pain.  Patient admits to using cocaine and marijuana occasionally.  Patient is not on any blood thinners currently.  The history is provided by the patient.       Past Medical History:  Diagnosis Date  . Carpal tunnel syndrome   . Depression   . Diabetes mellitus without complication (William Acevedo)   . Neuropathy     Patient Active Problem List   Diagnosis Date Noted  . Cocaine use disorder, severe, dependence (William Acevedo) 04/08/2019  . Major depressive disorder 04/08/2019  . Epiglottitis 04/07/2019  . Cocaine use 04/07/2019  . Acute encephalopathy 04/07/2019  . Overdose 07/06/2015  . AKI (acute kidney injury) (William Acevedo) 07/06/2015  . Polysubstance abuse (William Acevedo) 07/06/2015  . DM2 (diabetes mellitus, type 2) (William Acevedo) 07/06/2015  . Dehydration   . MDD (major depressive disorder), recurrent episode, mild (William Acevedo)   . Uncomplicated alcohol dependence (William Acevedo)     Past Surgical History:  Procedure Laterality Date  . TESTICLE TORSION REDUCTION         No family history on file.  Social History   Tobacco Use  . Smoking status: Current Every Day Smoker    Packs/day: 0.50    Types: Cigarettes  . Smokeless tobacco: Never  Used  Substance Use Topics  . Alcohol use: Yes  . Drug use: Yes    Types: Cocaine    Home Medications Prior to Admission medications   Medication Sig Start Date End Date Taking? Authorizing Provider  acetaminophen-codeine (TYLENOL #2) 300-15 MG tablet Take 1 tablet by mouth every 4 (four) hours as needed for moderate pain.    [provider]  amitriptyline (ELAVIL) 10 MG tablet Take 20 mg by mouth at bedtime.    [provider]  atorvastatin (LIPITOR) 20 MG tablet Take 20 mg by mouth daily.    [provider]  blood glucose meter kit and supplies KIT Dispense based on patient and insurance preference. Use up to four times daily as directed. (FOR ICD-9 250.00, 250.01). 09/13/19   Isla Pence, MD  cyclobenzaprine (FLEXERIL) 10 MG tablet Take 1 tablet (10 mg total) by mouth 2 (two) times daily as needed for muscle spasms. Patient not taking: No sig reported 01/18/19   Tegeler, William Allegra, MD  empagliflozin (JARDIANCE) 25 MG TABS tablet Take 25 mg by mouth daily.    [provider]  FLUoxetine (PROZAC) 20 MG capsule Take 1 capsule (20 mg total) by mouth daily. Patient not taking: No sig reported 04/09/19   William Bossier, MD  folic acid (FOLVITE) 1 MG tablet Take 1 tablet (1 mg total) by mouth daily. 04/08/19   William Bossier, MD  gabapentin (NEURONTIN)  100 MG capsule Take 1 capsule (100 mg total) by mouth 3 (three) times daily. Patient not taking: No sig reported 12/02/13   William Acevedo, William Soho, PA-C  gabapentin (NEURONTIN) 600 MG tablet Take 1,200 mg by mouth at bedtime.    [provider]  glipiZIDE (GLUCOTROL) 10 MG tablet Take 1 tablet (10 mg total) by mouth daily before breakfast. Patient taking differently: Take 10 mg by mouth daily. 08/24/19   William Glass, PA-C  HYDROcodone-acetaminophen (NORCO/VICODIN) 5-325 MG tablet Take 1 tablet by mouth every 4 (four) hours as needed for severe pain. 03/15/20   William Bennett, MD  ibuprofen  (ADVIL) 800 MG tablet Take 1 tablet (800 mg total) by mouth every 8 (eight) hours as needed. Patient not taking: No sig reported 09/21/18   Lawyer, William Gave, PA-C  insulin aspart (NOVOLOG FLEXPEN) 100 UNIT/ML FlexPen Inject 5 Units into the skin 3 (three) times daily with meals. 04/08/19   William Bossier, MD  insulin glargine (LANTUS) 100 UNIT/ML Solostar Pen Inject 40 Units into the skin daily. 08/24/19   William Glass, PA-C  Insulin Pen Needle 29G X 12MM MISC Per instructions 04/08/19   William Bossier, MD  ketoconazole (NIZORAL) 2 % cream Apply 1 fingertip amount to each foot daily. Patient not taking: No sig reported 08/10/17   William Acevedo, DPM  losartan (COZAAR) 25 MG tablet Take 25 mg by mouth daily.    [provider]  melatonin 3 MG TABS tablet Take 12 mg by mouth at bedtime.    [provider]  predniSONE (DELTASONE) 20 MG tablet Take 2 tablets (40 mg total) by mouth daily with breakfast. Patient not taking: No sig reported 04/09/19   William Bossier, MD  tadalafil (CIALIS) 10 MG tablet Take 10 mg by mouth daily as needed for erectile dysfunction.    [provider]  thiamine 100 MG tablet Take 1 tablet (100 mg total) by mouth daily. Patient not taking: No sig reported 04/09/19   William Bossier, MD  traMADol (ULTRAM) 50 MG tablet Take 1 tablet (50 mg total) by mouth every 6 (six) hours as needed for severe pain. Patient not taking: No sig reported 09/21/18   William Heading, PA-C    Allergies    Trazodone, Lisinopril, and Omeprazole  Review of Systems   Review of Systems  Gastrointestinal: Positive for abdominal pain.  All other systems reviewed and are negative.   Physical Exam Updated Vital Signs BP (!) 106/56   Pulse (!) 105   Temp (!) 97.5 F (36.4 C) (Oral)   Resp 15   SpO2 100%   Physical Exam Vitals and nursing note reviewed.  Constitutional:      Comments: Uncomfortable, vomiting  HENT:     Head: Normocephalic.      Mouth/Throat:     Mouth: Mucous membranes are moist.  Eyes:     Extraocular Movements: Extraocular movements intact.     Pupils: Pupils are equal, round, and reactive to light.  Cardiovascular:     Rate and Rhythm: Normal rate and regular rhythm.     Heart sounds: Normal heart sounds.  Pulmonary:     Effort: Pulmonary effort is normal.     Breath sounds: Normal breath sounds.  Abdominal:     Comments: + Epigastric tenderness mild bilateral CVAT  Skin:    General: Skin is warm.     Capillary Refill: Capillary refill takes less than 2 seconds.  Neurological:     General:  No focal deficit present.     Mental Status: He is oriented to person, place, and time.  Psychiatric:        Mood and Affect: Mood normal.        Behavior: Behavior normal.     ED Results / Procedures / Treatments   Labs (all labs ordered are listed, but only abnormal results are displayed) Labs Reviewed  COMPREHENSIVE METABOLIC PANEL - Abnormal; Notable for the following components:      Result Value   Sodium 128 (*)    Chloride 86 (*)    Glucose, Bld 539 (*)    All other components within normal limits  URINALYSIS, ROUTINE W REFLEX MICROSCOPIC - Abnormal; Notable for the following components:   Color, Urine STRAW (*)    Glucose, UA >=500 (*)    Ketones, ur 20 (*)    Protein, ur 100 (*)    All other components within normal limits  RAPID URINE DRUG SCREEN, HOSP PERFORMED - Abnormal; Notable for the following components:   Opiates POSITIVE (*)    Cocaine POSITIVE (*)    Tetrahydrocannabinol POSITIVE (*)    All other components within normal limits  BLOOD GAS, VENOUS - Abnormal; Notable for the following components:   Acid-Base Excess 2.2 (*)    All other components within normal limits  I-STAT CHEM 8, ED - Abnormal; Notable for the following components:   Sodium 130 (*)    Chloride 89 (*)    Glucose, Bld 551 (*)    Calcium, Ion 1.11 (*)    Hemoglobin 17.7 (*)    All other components within normal  limits  TROPONIN I (HIGH SENSITIVITY) - Abnormal; Notable for the following components:   Troponin I (High Sensitivity) 656 (*)    All other components within normal limits  RESP PANEL BY RT-PCR (FLU A&B, COVID) ARPGX2  CBC WITH DIFFERENTIAL/PLATELET  LIPASE, BLOOD  TROPONIN I (HIGH SENSITIVITY)    EKG EKG Interpretation  Date/Time:  Wednesday March 25 2020 16:00:37 EST Ventricular Rate:  108 PR Interval:    QRS Duration: 77 QT Interval:  343 QTC Calculation: 460 R Axis:   94 Text Interpretation: Sinus tachycardia Biatrial enlargement Right axis deviation Probable anteroseptal infarct, old No significant change since last tracing Confirmed by Wandra Arthurs [phone]) on 03/25/2020 5:08:44 PM   Radiology CT Angio Chest/Abd/Pel for Dissection W and/or Wo Contrast  Result Date: 03/25/2020 CLINICAL DATA:  Abdominal pain, concern for aortic dissection EXAM: CT ANGIOGRAPHY CHEST, ABDOMEN AND PELVIS TECHNIQUE: Non-contrast CT of the chest was initially obtained. Multidetector CT imaging through the chest, abdomen and pelvis was performed using the standard protocol during bolus administration of intravenous contrast. Multiplanar reconstructed images and MIPs were obtained and reviewed to evaluate the vascular anatomy. CONTRAST:  126m OMNIPAQUE IOHEXOL 350 MG/ML SOLN COMPARISON:  03/14/2020 FINDINGS: CTA CHEST FINDINGS Cardiovascular: Stable caliber of the thoracic aorta with atherosclerotic plaque. No evidence of intramural hematoma or dissection. Noncalcified plaque along the proximal left subclavian causes 50% stenosis. No evidence of central pulmonary embolus. Normal heart size. Coronary artery calcification. No pericardial effusion. Mediastinum/Nodes: No enlarged lymph nodes identified. Thyroid and esophagus are unremarkable. Lungs/Pleura: Mild paraseptal emphysema. No new consolidation. No pleural effusion or pneumothorax. Musculoskeletal: No acute osseous abnormality. Review of the MIP images  confirms the above findings. CTA ABDOMEN AND PELVIS FINDINGS VASCULAR Aorta: Normal in caliber. Diffuse mixed but primarily calcified plaque. No evidence of dissection. As before, there is marked plaque at the bifurcation with  luminal narrowing. Celiac: Patent origin with stable minimal narrowing. SMA: Patent origin with stable mild narrowing. Renals: Patent with stable at least moderate narrowing of the left renal artery origin. Patent small bilateral accessory renal arteries. The inferior right accessory renal artery is small in caliber with likely significant origin stenosis. IMA: Patent with stable moderate to marked stenosis at the origin. Inflow: Patent common iliacs with normal caliber and mixed but primarily calcified plaque. Patent internal and external iliacs. Veins: Not well evaluated. Review of the MIP images confirms the above findings. NON-VASCULAR Hepatobiliary: No significant abnormality. Pancreas: Unremarkable. Spleen: Unremarkable Adrenals/Urinary Tract: Adrenals are unremarkable. There is persistent hypoenhancement of the lower pole of the right kidney. Additional small areas of hypoenhancement are present within the cortex in the interpolar region. Punctate nonobstructing calculus of the interpolar right kidney. Bladder is unremarkable. Stomach/Bowel: Stomach is within normal limits. Bowel is normal in caliber. Paucity of mesenteric fat somewhat limits evaluation. Lymphatic: No enlarged lymph nodes identified. Reproductive: Unremarkable. Other: No ascites.  Abdominal wall is unremarkable. Musculoskeletal: No acute osseous abnormality. Review of the MIP images confirms the above findings. IMPRESSION: No substantial change since 03/14/2020. There is no evidence of aortic dissection. Significant atherosclerosis throughout. As before, there is stenosis at the abdominal aorta branch origins and bifurcation. Hypoperfusion of the inferior right kidney likely with some areas of infarction as before.  There is a small caliber accessory right renal artery primarily supplying the lower pole with high-grade origin stenosis. Electronically Signed   By: Macy Mis M.D.   On: 03/25/2020 16:41    Procedures Procedures   CRITICAL CARE Performed by: Wandra Arthurs   Total critical care time: 30 minutes  Critical care time was exclusive of separately billable procedures and treating other patients.  Critical care was necessary to treat or prevent imminent or life-threatening deterioration.  Critical care was time spent personally by me on the following activities: development of treatment plan with patient and/or surrogate as well as nursing, discussions with consultants, evaluation of patient's response to treatment, examination of patient, obtaining history from patient or surrogate, ordering and performing treatments and interventions, ordering and review of laboratory studies, ordering and review of radiographic studies, pulse oximetry and re-evaluation of patient's condition.   Medications Ordered in ED Medications  HYDROmorphone (DILAUDID) injection 1 mg (1 mg Intravenous Given 03/25/20 1441)  sodium chloride 0.9 % bolus 1,000 mL (0 mLs Intravenous Stopped 03/25/20 1535)  ondansetron (ZOFRAN) injection 4 mg (4 mg Intravenous Given 03/25/20 1441)  hydrALAZINE (APRESOLINE) injection 10 mg (10 mg Intravenous Given 03/25/20 1446)  insulin aspart (novoLOG) injection 5 Units (5 Units Subcutaneous Given 03/25/20 1536)  lip balm (CARMEX) ointment ( Topical Given 03/25/20 1553)  iohexol (OMNIPAQUE) 350 MG/ML injection 100 mL (100 mLs Intravenous Contrast Given 03/25/20 1605)    ED Course  I have reviewed the triage vital signs and the nursing notes.  Pertinent labs & imaging results that were available during my care of the patient were reviewed by me and considered in my medical decision making (see chart for details).    MDM Rules/Calculators/A&P                         ACHERON SUGG is a 65  y.o. male who presented with abdominal pain and vomiting.  Patient had recent CT that showed possible renal infarct versus pyelonephritis.  Patient's urine was normal.  Plan to get dedicated CTA to look for renal infarct.  Will repeat CBC and CMP and urinalysis.  Will give pain medicine and reassess  5:09 PM Patient has an elevated troponin at 650.  Patient CT did not show dissection but still has a renal infarct.  Patient has no obvious STEMI on his EKG.  He is still positive for cocaine and does admit to using it daily.  Blood pressure down to 110 from 200 after given IV hydralazine.  I think troponin leak likely from cocaine use versus uncontrolled hypertension.  I talked to Dr. Claiborne Billings from cardiology.  He does recommend treating for NSTEMI and medicine admission and cardiology to consult.  Started on heparin.   10:16 PM Patient told admitting hospitalist that he has been having right arm numbness and weakness since 1 PM today.  We consulted telemetry neurologist and patient actually told a different timeline.  States that he has been dropping things from the right hand for the last 2 days.  MRI brain showed multifocal acute ischemia.  Patient was seen by Dr. Erlinda Hong from teleneurology.  I discussed case with Dr. Cheral Marker and Dr Erlinda Hong from neurology. They reviewed scans. Since patient has NSTEMI and no bleed on MRI brain, would be ok to get heparin for now.    Final Clinical Impression(s) / ED Diagnoses Final diagnoses:  None    Rx / DC Orders ED Discharge Orders    None       Drenda Freeze, MD 03/25/20 1716    Drenda Freeze, MD 03/25/20 2256

## 2020-03-26 ENCOUNTER — Inpatient Hospital Stay (HOSPITAL_COMMUNITY): Payer: No Typology Code available for payment source

## 2020-03-26 DIAGNOSIS — F1910 Other psychoactive substance abuse, uncomplicated: Secondary | ICD-10-CM | POA: Diagnosis not present

## 2020-03-26 DIAGNOSIS — I639 Cerebral infarction, unspecified: Secondary | ICD-10-CM | POA: Diagnosis not present

## 2020-03-26 DIAGNOSIS — E1159 Type 2 diabetes mellitus with other circulatory complications: Secondary | ICD-10-CM | POA: Diagnosis not present

## 2020-03-26 DIAGNOSIS — I214 Non-ST elevation (NSTEMI) myocardial infarction: Secondary | ICD-10-CM | POA: Diagnosis not present

## 2020-03-26 DIAGNOSIS — Z794 Long term (current) use of insulin: Secondary | ICD-10-CM

## 2020-03-26 DIAGNOSIS — F1490 Cocaine use, unspecified, uncomplicated: Secondary | ICD-10-CM | POA: Diagnosis not present

## 2020-03-26 DIAGNOSIS — I6330 Cerebral infarction due to thrombosis of unspecified cerebral artery: Secondary | ICD-10-CM | POA: Insufficient documentation

## 2020-03-26 LAB — ECHOCARDIOGRAM COMPLETE
Area-P 1/2: 4.06 cm2
S' Lateral: 2.2 cm

## 2020-03-26 LAB — CBC
HCT: 38.8 % — ABNORMAL LOW (ref 39.0–52.0)
Hemoglobin: 12.6 g/dL — ABNORMAL LOW (ref 13.0–17.0)
MCH: 28.4 pg (ref 26.0–34.0)
MCHC: 32.5 g/dL (ref 30.0–36.0)
MCV: 87.6 fL (ref 80.0–100.0)
Platelets: 192 10*3/uL (ref 150–400)
RBC: 4.43 MIL/uL (ref 4.22–5.81)
RDW: 11.9 % (ref 11.5–15.5)
WBC: 8 10*3/uL (ref 4.0–10.5)
nRBC: 0 % (ref 0.0–0.2)

## 2020-03-26 LAB — COMPREHENSIVE METABOLIC PANEL
ALT: 20 U/L (ref 0–44)
AST: 27 U/L (ref 15–41)
Albumin: 2.9 g/dL — ABNORMAL LOW (ref 3.5–5.0)
Alkaline Phosphatase: 54 U/L (ref 38–126)
Anion gap: 8 (ref 5–15)
BUN: 18 mg/dL (ref 8–23)
CO2: 27 mmol/L (ref 22–32)
Calcium: 8.3 mg/dL — ABNORMAL LOW (ref 8.9–10.3)
Chloride: 97 mmol/L — ABNORMAL LOW (ref 98–111)
Creatinine, Ser: 0.86 mg/dL (ref 0.61–1.24)
GFR, Estimated: >60 mL/min (ref >60)
Glucose, Bld: 115 mg/dL — ABNORMAL HIGH (ref 70–99)
Potassium: 3.7 mmol/L (ref 3.5–5.1)
Sodium: 132 mmol/L — ABNORMAL LOW (ref 135–145)
Total Bilirubin: 0.6 mg/dL (ref 0.3–1.2)
Total Protein: 5.9 g/dL — ABNORMAL LOW (ref 6.5–8.1)

## 2020-03-26 LAB — LIPID PANEL
Cholesterol: 205 mg/dL — ABNORMAL HIGH (ref 0–200)
HDL: 57 mg/dL (ref >40)
LDL Cholesterol: 137 mg/dL — ABNORMAL HIGH (ref 0–99)
Total CHOL/HDL Ratio: 3.6 RATIO
Triglycerides: 53 mg/dL (ref <150)
VLDL: 11 mg/dL (ref 0–40)

## 2020-03-26 LAB — BASIC METABOLIC PANEL
Anion gap: 7 (ref 5–15)
BUN: 18 mg/dL (ref 8–23)
CO2: 26 mmol/L (ref 22–32)
Calcium: 8.2 mg/dL — ABNORMAL LOW (ref 8.9–10.3)
Chloride: 98 mmol/L (ref 98–111)
Creatinine, Ser: 0.93 mg/dL (ref 0.61–1.24)
GFR, Estimated: >60 mL/min (ref >60)
Glucose, Bld: 121 mg/dL — ABNORMAL HIGH (ref 70–99)
Potassium: 3.9 mmol/L (ref 3.5–5.1)
Sodium: 131 mmol/L — ABNORMAL LOW (ref 135–145)

## 2020-03-26 LAB — CBG MONITORING, ED
Glucose-Capillary: 56 mg/dL — ABNORMAL LOW (ref 70–99)
Glucose-Capillary: 50 mg/dL — ABNORMAL LOW (ref 70–99)
Glucose-Capillary: 129 mg/dL — ABNORMAL HIGH (ref 70–99)
Glucose-Capillary: 250 mg/dL — ABNORMAL HIGH (ref 70–99)

## 2020-03-26 LAB — GLUCOSE, CAPILLARY
Glucose-Capillary: 100 mg/dL — ABNORMAL HIGH (ref 70–99)
Glucose-Capillary: 240 mg/dL — ABNORMAL HIGH (ref 70–99)

## 2020-03-26 LAB — HEMOGLOBIN A1C
Hgb A1c MFr Bld: 10.6 % — ABNORMAL HIGH (ref 4.8–5.6)
Mean Plasma Glucose: 257.52 mg/dL

## 2020-03-26 LAB — HEPARIN LEVEL (UNFRACTIONATED)
Heparin Unfractionated: <0.1 IU/mL — ABNORMAL LOW (ref 0.30–0.70)
Heparin Unfractionated: 0.52 IU/mL (ref 0.30–0.70)
Heparin Unfractionated: 0.26 IU/mL — ABNORMAL LOW (ref 0.30–0.70)

## 2020-03-26 MED ORDER — HEPARIN BOLUS VIA INFUSION
1500.0000 [IU] | Freq: Once | INTRAVENOUS | Status: AC
  Administered 2020-03-26: 1500 [IU] via INTRAVENOUS
  Filled 2020-03-26: qty 1500

## 2020-03-26 MED ORDER — DEXTROSE 50 % IV SOLN
INTRAVENOUS | Status: AC
  Administered 2020-03-26: 50 mL via INTRAVENOUS
  Filled 2020-03-26: qty 50

## 2020-03-26 MED ORDER — NICOTINE 21 MG/24HR TD PT24
21.0000 mg | MEDICATED_PATCH | Freq: Every day | TRANSDERMAL | Status: DC
  Administered 2020-03-26 – 2020-03-30 (×5): 21 mg via TRANSDERMAL
  Filled 2020-03-26 (×5): qty 1

## 2020-03-26 MED ORDER — LACTATED RINGERS IV BOLUS
1000.0000 mL | Freq: Once | INTRAVENOUS | Status: AC
  Administered 2020-03-26: 1000 mL via INTRAVENOUS

## 2020-03-26 MED ORDER — DEXTROSE 50 % IV SOLN: 1.0000 | Freq: Once | INTRAVENOUS | Status: AC

## 2020-03-26 MED ORDER — HYDROCODONE-ACETAMINOPHEN 5-325 MG PO TABS
1.0000 | ORAL_TABLET | ORAL | Status: DC | PRN
  Administered 2020-03-26 (×3): 2 via ORAL
  Administered 2020-03-27: 1 via ORAL
  Administered 2020-03-27 – 2020-03-29 (×13): 2 via ORAL
  Administered 2020-03-30: 1 via ORAL
  Administered 2020-03-30 (×2): 2 via ORAL
  Filled 2020-03-26 (×4): qty 2
  Filled 2020-03-26: qty 1
  Filled 2020-03-26 (×15): qty 2

## 2020-03-26 MED ORDER — INSULIN ASPART 100 UNIT/ML ~~LOC~~ SOLN
0.0000 [IU] | Freq: Three times a day (TID) | SUBCUTANEOUS | Status: DC
  Administered 2020-03-26: 3 [IU] via SUBCUTANEOUS
  Administered 2020-03-27: 2 [IU] via SUBCUTANEOUS
  Administered 2020-03-27: 5 [IU] via SUBCUTANEOUS
  Administered 2020-03-28: 7 [IU] via SUBCUTANEOUS
  Administered 2020-03-28: 5 [IU] via SUBCUTANEOUS
  Administered 2020-03-28 – 2020-03-29 (×2): 7 [IU] via SUBCUTANEOUS
  Administered 2020-03-29 (×2): 9 [IU] via SUBCUTANEOUS
  Administered 2020-03-30: 2 [IU] via SUBCUTANEOUS
  Administered 2020-03-30: 9 [IU] via SUBCUTANEOUS
  Filled 2020-03-26: qty 0.09

## 2020-03-26 MED ORDER — INSULIN ASPART 100 UNIT/ML ~~LOC~~ SOLN
0.0000 [IU] | Freq: Every day | SUBCUTANEOUS | Status: DC
  Administered 2020-03-26: 2 [IU] via SUBCUTANEOUS
  Administered 2020-03-28: 4 [IU] via SUBCUTANEOUS
  Administered 2020-03-29: 3 [IU] via SUBCUTANEOUS
  Filled 2020-03-26: qty 0.05

## 2020-03-26 NOTE — Plan of Care (Signed)
  Problem: Education: Goal: Knowledge of disease or condition will improve Outcome: Adequate for Discharge Goal: Knowledge of secondary prevention will improve Outcome: Adequate for Discharge Goal: Knowledge of patient specific risk factors addressed and post discharge goals established will improve Outcome: Adequate for Discharge   

## 2020-03-26 NOTE — ED Notes (Signed)
Updated family member regarding plan of care

## 2020-03-26 NOTE — Plan of Care (Signed)
  Problem: Education: Goal: Knowledge of disease or condition will improve 03/26/2020 2035 by Becky Augusta, RN Outcome: Adequate for Discharge 03/26/2020 2034 by Becky Augusta, RN Outcome: Adequate for Discharge Goal: Knowledge of secondary prevention will improve Outcome: Adequate for Discharge Goal: Knowledge of patient specific risk factors addressed and post discharge goals established will improve Outcome: Adequate for Discharge

## 2020-03-26 NOTE — ED Notes (Signed)
Per Blake Divine, MD, patient can now have something to eat/drink.  Patient give ice water and diet ginger ale. Called kitchen for a breakfast tray.  Patient updated.

## 2020-03-26 NOTE — Progress Notes (Signed)
ANTICOAGULATION CONSULT NOTE  Pharmacy Consult for IV heparin Indication: chest pain/ACS  Allergies  Allergen Reactions  . Trazodone     Other reaction(s): Priapism  . Lisinopril Swelling    angioedema  . Omeprazole     Other reaction(s): ANGIOEDEMA OF LIPS, Angioedema of tongue.    Patient Measurements:   Heparin Dosing Weight: TBW  Vital Signs: BP: 111/75 (03/10 0200) Pulse Rate: 87 (03/10 0200)  Labs: Recent Labs    03/25/20 1420 03/25/20 1446 03/25/20 1603 03/25/20 1656 03/25/20 2315 03/26/20 0131 03/26/20 0135  HGB 16.1 17.7*  --   --   --   --   --   HCT 48.6 52.0  --   --   --   --   --   PLT 243  --   --   --   --   --   --   HEPARINUNFRC  --   --   --   --   --  <0.10*  --   CREATININE 1.23 1.10 1.00  --  0.95  --  0.93  TROPONINIHS 656*  --   --  642*  --   --   --     Estimated Creatinine Clearance: 59.1 mL/min (by C-G formula based on SCr of 0.93 mg/dL).   Medical History: Past Medical History:  Diagnosis Date  . Carpal tunnel syndrome   . Depression   . Diabetes mellitus without complication (HCC)   . Neuropathy     Medications:  (Not in a hospital admission)  Scheduled:  . amitriptyline  20 mg Oral QHS  . aspirin EC  81 mg Oral Daily  . atorvastatin  20 mg Oral Daily  . folic acid  1 mg Oral Daily  . gabapentin  1,200 mg Oral QHS  . insulin aspart  0-15 Units Subcutaneous Q4H  . insulin glargine  20 Units Subcutaneous QHS  . losartan  25 mg Oral Daily  . melatonin  12 mg Oral QHS  . thiamine  100 mg Oral Daily   Infusions:  . famotidine (PEPCID) IV    . heparin 650 Units/hr (03/25/20 1849)  . lactated ringers    . lactated ringers     Assessment: 75 yoM with PMH DM2, HTN, subst abuse, admitted 3/9 with abdominal pain and n/v for over a week. CT from ED visit 2 wks ago showed pyelo vs renal infarct. Now found to have hyperglycemia, and hypertension, with elevated troponins. Renal infarct still present on CT. Cardiology consulted  for troponins; requesting heparin per Pharmacy for NSTEMI treatment.   Baseline INR, aPTT: not done  Prior anticoagulation: none  Significant events:  Today, 03/26/2020:  Heparin level < 0.1 subtherapeutic on 650 units/hr  No bleeding or infusion issues per nursing  Goal of Therapy: Heparin level 0.3-0.7 units/ml Monitor platelets by anticoagulation protocol: Yes  Plan:  Heparin 1500 units IV bolus x 1  Increase Heparin drip to  850 units/hr IV   Check heparin level 6 hrs   Daily CBC, daily heparin level once stable  Monitor for signs of bleeding or thrombosis  Arley Phenix RPh 03/26/2020, 3:11 AM

## 2020-03-26 NOTE — Progress Notes (Signed)
Inpatient Diabetes Program Recommendations  AACE/ADA: New Consensus Statement on Inpatient Glycemic Control (2015)  Target Ranges:  Prepandial:   less than 140 mg/dL      Peak postprandial:   less than 180 mg/dL (1-2 hours)      Critically ill patients:  140 - 180 mg/dL   Lab Results  Component Value Date   GLUCAP 129 (H) 03/26/2020   HGBA1C 10.6 (H) 03/26/2020    Review of Glycemic Control Results for [name], [name] (MRN [id number]) as of 03/26/2020 11:25  Ref. Range 08/24/2019 01:57 08/24/2019 02:49 03/25/2020 18:04 03/25/2020 21:56 03/25/2020 23:31 03/26/2020 03:51 03/26/2020 07:57 03/26/2020 08:49  Glucose-Capillary Latest Ref Range: 70 - 99 mg/dL 449 (H) 675 (H) 916 (H) Novolog 8 units 49 (L) 200 (H) Novolog 3 units 56 (L) 50 (L) 129 (H)   Diabetes history: DM2 Outpatient Diabetes medications: Lantus 40 units qd + Novolog 5 units tid meal coverage + Glucotrol 10 mg qd + Jardiance 25 mg qd Current orders for Inpatient glycemic control: Lantus 20 units qd + Novolog 0-15 units q 4 hrs.  Inpatient Diabetes Program Recommendations:    Patient had hypoglycemia post correction. See above. -Decrease Novolog correction to 0-9 units tid + hs 0-5 units Secure chat sent to Dr. Blake Divine.  Thank you, William Acevedo. Sherle Mello, RN, MSN, CDE  Diabetes Coordinator Inpatient Glycemic Control Team Team Pager [phone] (8am-5pm) 03/26/2020 11:28 AM

## 2020-03-26 NOTE — Progress Notes (Signed)
PROGRESS NOTE    William Acevedo  PJA:[id number] DOB: 07-28-1955 DOA: 03/25/2020 PCP: Priscille Kluver, MD    Chief Complaint  Patient presents with  . Abdominal Pain    Brief Narrative:  65 yo M with Hx HTN, DM, HLD, polysubstance abuse (cocaine and THC), smoker, alcohol abuse, major depressive disorder presented to ER for abdominal pain for the 3rd time in the last 2 weeks. On arrival to ED, he was found to have elevated troponin's, hyperglycemic with cbg's in 500 to 600.  hemoglobin A1c is 10.6, UDS is positive for opiates, cocaine and tetrahydrocannabinol. EKG shows bi atrial enlargement with right axis deviation. CT angio of the chest , abd and pelvis showed Hypoperfusion of the inferior right kidney likely with some areas of infarction as before. There is a small caliber accessory right renal artery primarily supplying the lower pole with high-grade origin stenosis. Cardiology consulted for NSTEMI , elevated troponins, started on IV heparin. Pt also reports right hand weakness started about 3 days ago. MRI brain showed Multifocal acute ischemia within both cerebellar hemispheres, the right occipital lobe and both frontal lobes. Neurology consulted and further stroke work up ordered.  Pt is scheduled to be transferred to [facility] for further work up .   Assessment & Plan:   Principal Problem:   NSTEMI (non-ST elevated myocardial infarction) (HCC) Active Problems:   MDD (major depressive disorder), recurrent episode, mild (HCC)   Polysubstance abuse (HCC)   DM2 (diabetes mellitus, type 2) (HCC)   Cocaine use   Secondary diabetes mellitus with HHNC (hyperglycemia hyperosmolar non-ketotic coma) (HCC)   Cerebral thrombosis with cerebral infarction  NSTEMI:  -started the patient on IV heparin.  EKG shows right axis deviation.  Cardiology on board.  echocardiogram ordered.  Continue with aspirin, lipitor.  Lipid panel ordered.    Right arm weakness/ Ataxia: -MRI brain showed  Multifocal acute ischemia within both cerebellar hemispheres, the right occipital lobe and both frontal lobes. - further stroke work up ordered.  - neurology on board.    Essential hypertension:  Admitted with elevated BP, optimal BP parameters.     Uncontrolled DM with hyperglycemia and hypoglycemia:  Non anion gap.  Continue with IV fluids.  Continue with lantus and SSI.  CBG (last 3)  Recent Labs    03/26/20 0757 03/26/20 0849 03/26/20 1201  GLUCAP 50* 129* 250*    Nausea, vomiting and abdominal pain:  Possible Rena infarct on CTA Resolved.     Hyperlipidemia:  Resume statin.    Major depressive disorder Resume home meds.    Polysubstance abuse with cocaine:  UDS positive with cocaine, opiates and THC.  TOC consult in place.    Pseudohyponatremia:  Sodium improved from 128 to 132.    DVT prophylaxis: (Heparin) Code Status: (Full code) Family Communication: none at bedside.  Disposition:   Status is: Inpatient  Remains inpatient appropriate because:Ongoing diagnostic testing needed not appropriate for outpatient work up   Dispo: The patient is from: Home              Anticipated d/c is to: Home              Patient currently is not medically stable to d/c.   Difficult to place patient No       Consultants:   Neurology  Cardiology.    Procedures: MRI of the abdomen.   Echocardiogram.   Antimicrobials: none.   Subjective: No chest pain or sob, no nausea, vomiting. No  blurry vision. No headache.   Objective: Vitals:   03/26/20 0934 03/26/20 1000 03/26/20 1100 03/26/20 1200  BP: 129/74 (!) 152/94 (!) 102/54 122/71  Pulse: 92 96 87 89  Resp: 20 16 15 20   Temp:      TempSrc:      SpO2: 100% 95% 100% 100%    Intake/Output Summary (Last 24 hours) at 03/26/2020 1210 Last data filed at 03/26/2020 0943 Gross per 24 hour  Intake 126.87 ml  Output 400 ml  Net -273.13 ml   There were no vitals filed for this  visit.  Examination:  General exam: Appears calm and comfortable  Respiratory system: Clear to auscultation. Respiratory effort normal. Cardiovascular system: S1 & S2 heard, RRR. No JVD, . No pedal edema. Gastrointestinal system: Abdomen is nondistended, soft and nontender.Normal bowel sounds heard. Central nervous system: Alert and oriented. Persistent right arm weakness .  Extremities: Symmetric 5 x 5 power. Skin: No rashes, lesions or ulcers Psychiatry:  Mood & affect appropriate.     Data Reviewed: I have personally reviewed following labs and imaging studies  CBC: Recent Labs  Lab 03/25/20 1420 03/25/20 1446 03/26/20 0446  WBC 6.8  --  8.0  NEUTROABS 5.3  --   --   HGB 16.1 17.7* 12.6*  HCT 48.6 52.0 38.8*  MCV 86.9  --  87.6  PLT 243  --  192    Basic Metabolic Panel: Recent Labs  Lab 03/25/20 1420 03/25/20 1446 03/25/20 1603 03/25/20 2315 03/26/20 0135 03/26/20 0446  NA 128* 130* 130* 130* 131* 132*  K 4.0 4.1 3.9 4.0 3.9 3.7  CL 86* 89* 92* 96* 98 97*  CO2 28  --  28 27 26 27   GLUCOSE 539* 551* 365* 241* 121* 115*  BUN 23 23 21 20 18 18   CREATININE 1.23 1.10 1.00 0.95 0.93 0.86  CALCIUM 9.2  --  8.6* 8.2* 8.2* 8.3*  MG  --   --  2.1  --   --   --   PHOS  --   --  2.8  --   --   --     GFR: Estimated Creatinine Clearance: 63.9 mL/min (by C-G formula based on SCr of 0.86 mg/dL).  Liver Function Tests: Recent Labs  Lab 03/25/20 1420 03/26/20 0446  AST 36 27  ALT 27 20  ALKPHOS 82 54  BILITOT 0.8 0.6  PROT 8.1 5.9*  ALBUMIN 3.8 2.9*    CBG: Recent Labs  Lab 03/25/20 2331 03/26/20 0351 03/26/20 0757 03/26/20 0849 03/26/20 1201  GLUCAP 200* 56* 50* 129* 250*     Recent Results (from the past 240 hour(s))  Resp Panel by RT-PCR (Flu A&B, Covid) Nasopharyngeal Swab     Status: None   Collection Time: 03/25/20  2:20 PM   Specimen: Nasopharyngeal Swab; Nasopharyngeal(NP) swabs in vial transport medium  Result Value Ref Range Status    SARS Coronavirus 2 by RT PCR NEGATIVE NEGATIVE Final    Comment: (NOTE) SARS-CoV-2 target nucleic acids are NOT DETECTED.  The SARS-CoV-2 RNA is generally detectable in upper respiratory specimens during the acute phase of infection. The lowest concentration of SARS-CoV-2 viral copies this assay can detect is 138 copies/mL. A negative result does not preclude SARS-Cov-2 infection and should not be used as the sole basis for treatment or other patient management decisions. A negative result may occur with  improper specimen collection/handling, submission of specimen other than nasopharyngeal swab, presence of viral mutation(s) within the areas targeted  by this assay, and inadequate number of viral copies(<138 copies/mL). A negative result must be combined with clinical observations, patient history, and epidemiological information. The expected result is Negative.  Fact Sheet for Patients:  BloggerCourse.com  Fact Sheet for Healthcare Providers:  SeriousBroker.it  This test is no t yet approved or cleared by the [place] FDA and  has been authorized for detection and/or diagnosis of SARS-CoV-2 by FDA under an Emergency Use Authorization (EUA). This EUA will remain  in effect (meaning this test can be used) for the duration of the COVID-19 declaration under Section 564(b)(1) of the Act, 21 U.S.C.section 360bbb-3(b)(1), unless the authorization is terminated  or revoked sooner.       Influenza A by PCR NEGATIVE NEGATIVE Final   Influenza B by PCR NEGATIVE NEGATIVE Final    Comment: (NOTE) The Xpert Xpress SARS-CoV-2/FLU/RSV plus assay is intended as an aid in the diagnosis of influenza from Nasopharyngeal swab specimens and should not be used as a sole basis for treatment. Nasal washings and aspirates are unacceptable for Xpert Xpress SARS-CoV-2/FLU/RSV testing.  Fact Sheet for  Patients: BloggerCourse.com  Fact Sheet for Healthcare Providers: SeriousBroker.it  This test is not yet approved or cleared by the [place] FDA and has been authorized for detection and/or diagnosis of SARS-CoV-2 by FDA under an Emergency Use Authorization (EUA). This EUA will remain in effect (meaning this test can be used) for the duration of the COVID-19 declaration under Section 564(b)(1) of the Act, 21 U.S.C. section 360bbb-3(b)(1), unless the authorization is terminated or revoked.  Performed at [facility], [address], [place], [place] [address]          Radiology Studies: CT Head Wo Contrast  Result Date: 03/25/2020 CLINICAL DATA:  Right-sided weakness. EXAM: CT HEAD WITHOUT CONTRAST TECHNIQUE: Contiguous axial images were obtained from the base of the skull through the vertex without intravenous contrast. COMPARISON:  April 06, 2019 and January 18, 2019 FINDINGS: Brain: No evidence of acute infarction, hemorrhage, hydrocephalus, extra-axial collection or mass lesion/mass effect. A small, stable area of white matter low attenuation is seen within the left occipital lobe. There is no evidence of associated mass effect or midline shift. While this is stable in appearance when compared to the prior plain brain CT, dated April 06, 2019, it does represent a new finding when compared to the earlier exam from January 18, 2019. Vascular: No hyperdense vessel or unexpected calcification. Skull: A chronic left-sided nasal bone fracture is seen. Sinuses/Orbits: No acute finding. Other: Mild left posterior parietal scalp soft tissue swelling is seen. IMPRESSION: 1. Findings suggestive of a small, stable chronic left occipital lobe infarct. MRI correlation is recommended. 2. No acute intracranial abnormality. Electronically Signed   By: Aram Candela M.D.   On: 03/25/2020 19:27   MR BRAIN WO CONTRAST  Result  Date: 03/25/2020 CLINICAL DATA:  Right arm weakness EXAM: MRI HEAD WITHOUT CONTRAST TECHNIQUE: Multiplanar, multiecho pulse sequences of the brain and surrounding structures were obtained without intravenous contrast. COMPARISON:  Head CT 03/25/2020 FINDINGS: Brain: There is multifocal abnormal diffusion restriction within both cerebellar hemispheres, the right occipital lobe and both frontal lobes. No acute or chronic hemorrhage. There is multifocal hyperintense T2-weighted signal within the white matter. Parenchymal volume and CSF spaces are normal. The midline structures are normal. Vascular: Major flow voids are preserved. Skull and upper cervical spine: Normal calvarium and skull base. Visualized upper cervical spine and soft tissues are normal. Sinuses/Orbits:No paranasal sinus fluid levels  or advanced mucosal thickening. No mastoid or middle ear effusion. Normal orbits. IMPRESSION: Multifocal acute ischemia within both cerebellar hemispheres, the right occipital lobe and both frontal lobes. No hemorrhage or mass effect. Electronically Signed   By: Deatra Robinson M.D.   On: 03/25/2020 22:10   CT Angio Chest/Abd/Pel for Dissection W and/or Wo Contrast  Result Date: 03/25/2020 CLINICAL DATA:  Abdominal pain, concern for aortic dissection EXAM: CT ANGIOGRAPHY CHEST, ABDOMEN AND PELVIS TECHNIQUE: Non-contrast CT of the chest was initially obtained. Multidetector CT imaging through the chest, abdomen and pelvis was performed using the standard protocol during bolus administration of intravenous contrast. Multiplanar reconstructed images and MIPs were obtained and reviewed to evaluate the vascular anatomy. CONTRAST:  OMNIPAQUE IOHEXOL 350 MG/ML SOLN COMPARISON:  03/14/2020 FINDINGS: CTA CHEST FINDINGS Cardiovascular: Stable caliber of the thoracic aorta with atherosclerotic plaque. No evidence of intramural hematoma or dissection. Noncalcified plaque along the proximal left subclavian causes 50% stenosis. No  evidence of central pulmonary embolus. Normal heart size. Coronary artery calcification. No pericardial effusion. Mediastinum/Nodes: No enlarged lymph nodes identified. Thyroid and esophagus are unremarkable. Lungs/Pleura: Mild paraseptal emphysema. No new consolidation. No pleural effusion or pneumothorax. Musculoskeletal: No acute osseous abnormality. Review of the MIP images confirms the above findings. CTA ABDOMEN AND PELVIS FINDINGS VASCULAR Aorta: Normal in caliber. Diffuse mixed but primarily calcified plaque. No evidence of dissection. As before, there is marked plaque at the bifurcation with luminal narrowing. Celiac: Patent origin with stable minimal narrowing. SMA: Patent origin with stable mild narrowing. Renals: Patent with stable at least moderate narrowing of the left renal artery origin. Patent small bilateral accessory renal arteries. The inferior right accessory renal artery is small in caliber with likely significant origin stenosis. IMA: Patent with stable moderate to marked stenosis at the origin. Inflow: Patent common iliacs with normal caliber and mixed but primarily calcified plaque. Patent internal and external iliacs. Veins: Not well evaluated. Review of the MIP images confirms the above findings. NON-VASCULAR Hepatobiliary: No significant abnormality. Pancreas: Unremarkable. Spleen: Unremarkable Adrenals/Urinary Tract: Adrenals are unremarkable. There is persistent hypoenhancement of the lower pole of the right kidney. Additional small areas of hypoenhancement are present within the cortex in the interpolar region. Punctate nonobstructing calculus of the interpolar right kidney. Bladder is unremarkable. Stomach/Bowel: Stomach is within normal limits. Bowel is normal in caliber. Paucity of mesenteric fat somewhat limits evaluation. Lymphatic: No enlarged lymph nodes identified. Reproductive: Unremarkable. Other: No ascites.  Abdominal wall is unremarkable. Musculoskeletal: No acute osseous  abnormality. Review of the MIP images confirms the above findings. IMPRESSION: No substantial change since 03/14/2020. There is no evidence of aortic dissection. Significant atherosclerosis throughout. As before, there is stenosis at the abdominal aorta branch origins and bifurcation. Hypoperfusion of the inferior right kidney likely with some areas of infarction as before. There is a small caliber accessory right renal artery primarily supplying the lower pole with high-grade origin stenosis. Electronically Signed   By: Guadlupe Spanish M.D.   On: 03/25/2020 16:41        Scheduled Meds: . amitriptyline  20 mg Oral QHS  . aspirin EC  81 mg Oral Daily  . atorvastatin  20 mg Oral Daily  . folic acid  1 mg Oral Daily  . gabapentin  1,200 mg Oral QHS  . insulin aspart  0-5 Units Subcutaneous QHS  . insulin aspart  0-9 Units Subcutaneous TID WC  . insulin glargine  20 Units Subcutaneous QHS  . losartan  25 mg Oral  Daily  . melatonin  12 mg Oral QHS  . thiamine  100 mg Oral Daily   Continuous Infusions: . famotidine (PEPCID) IV 20 mg (03/26/20 [id number])  . heparin 850 Units/hr (03/26/20 0943)  . lactated ringers    . lactated ringers       LOS: 1 day        Kathlen ModyVijaya Akula, MD [facility]   To contact the attending provider between 7A-7P or the covering provider during after hours 7P-7A, please log into the web site [email or web address] and access using universal [place] password for that web site. If you do not have the password, please call the hospital operator.  03/26/2020, 12:10 PM

## 2020-03-26 NOTE — Progress Notes (Signed)
ANTICOAGULATION CONSULT NOTE - Follow Up Consult  Pharmacy Consult for heparin Indication: chest pain/ACS and stroke  Allergies  Allergen Reactions   Trazodone     Other reaction(s): Priapism   Lisinopril Swelling    angioedema   Omeprazole     Other reaction(s): ANGIOEDEMA OF LIPS, Angioedema of tongue.    Patient Measurements: weight 53 kg, ht 66 inches   Heparin Dosing Weight: 53 kg  Vital Signs: BP: 129/74 (03/10 0934) Pulse Rate: 92 (03/10 0934)  Labs: Recent Labs    03/25/20 1420 03/25/20 1446 03/25/20 1603 03/25/20 1656 03/25/20 2315 03/26/20 0131 03/26/20 0135 03/26/20 0446  HGB 16.1 17.7*  --   --   --   --   --  12.6*  HCT 48.6 52.0  --   --   --   --   --  38.8*  PLT 243  --   --   --   --   --   --  192  HEPARINUNFRC  --   --   --   --   --  <0.10*  --   --   CREATININE 1.23 1.10   < >  --  0.95  --  0.93 0.86  TROPONINIHS 656*  --   --  642*  --   --   --   --    < > = values in this interval not displayed.    Estimated Creatinine Clearance: 63.9 mL/min (by C-G formula based on SCr of 0.86 mg/dL).   Assessment: Patient is a 65 y.o M with renal infarcts (per abd CT on 03/15/20) and hx polysubstance abuse presented to the ED on 3/9 with c/o of abdominal pain. He was subsequently found to have elevated troponin and began having one sided weakness. Brain MRI on 3/9 showed "multifocal acute ischemia within both cerebellar hemispheres, the right occipital lobe and both frontal lobes."  Heparin drip started on admission.  Today, 03/26/2020: - heparin level is borderline at 0.52 - hgb 12.6, plts 192 - no bleeding documented   Goal of Therapy:  Heparin level 0.3-0.5 units/ml Monitor platelets by anticoagulation protocol: Yes   Plan:  - continue heparin drip at 850 units/hr for now - recheck another heparin level at 4pm - monitor for s/sx bleeding  Lorena Benham P 03/26/2020,9:45 AM

## 2020-03-26 NOTE — Progress Notes (Signed)
  Echocardiogram 2D Echocardiogram has been performed.  Pieter Partridge 03/26/2020, 12:11 PM

## 2020-03-26 NOTE — Progress Notes (Signed)
ANTICOAGULATION CONSULT NOTE  Pharmacy Consult for heparin Indication: chest pain/ACS and stroke  Allergies  Allergen Reactions  . Trazodone     Other reaction(s): Priapism  . Lisinopril Swelling    angioedema  . Omeprazole     Other reaction(s): ANGIOEDEMA OF LIPS, Angioedema of tongue.    Patient Measurements: weight 53 kg, ht 66 inches   Heparin Dosing Weight: 53 kg  Vital Signs: BP: 97/63 (03/10 1400) Pulse Rate: 94 (03/10 1400)  Labs: Recent Labs    03/25/20 1420 03/25/20 1446 03/25/20 1603 03/25/20 1656 03/25/20 2315 03/26/20 0131 03/26/20 0135 03/26/20 0446 03/26/20 0941 03/26/20 1645  HGB 16.1 17.7*  --   --   --   --   --  12.6*  --   --   HCT 48.6 52.0  --   --   --   --   --  38.8*  --   --   PLT 243  --   --   --   --   --   --  192  --   --   HEPARINUNFRC  --   --   --   --   --  <0.10*  --   --  0.52 0.26*  CREATININE 1.23 1.10   < >  --  0.95  --  0.93 0.86  --   --   TROPONINIHS 656*  --   --  642*  --   --   --   --   --   --    < > = values in this interval not displayed.    Estimated Creatinine Clearance: 63.9 mL/min (by C-G formula based on SCr of 0.86 mg/dL).   Assessment: Patient is a 65 y.o M with renal infarcts (per abd CT on 03/15/20) and hx polysubstance abuse presented to the ED on 3/9 with c/o of abdominal pain. He was subsequently found to have elevated troponin and began having one sided weakness. Brain MRI on 3/9 showed "multifocal acute ischemia within both cerebellar hemispheres, the right occipital lobe and both frontal lobes."  Pharmacy consulted to dose IV heparin.  Heparin level trended down to 0.26 units/mL, sub-therapeutic.  No issue with heparin infusion nor bleeding per RN.  Goal of Therapy:  Heparin level 0.3-0.5 units/ml Monitor platelets by anticoagulation protocol: Yes   Plan:  Increase heparin gtt to 950 units/hr - no bolus with CVA Check 6 hr heparin level  Felesia Stahlecker D. Laney Potash, PharmD, BCPS, BCCCP 03/26/2020, 6:41  PM

## 2020-03-26 NOTE — ED Notes (Signed)
[facility] has arrived to transport patient.  

## 2020-03-26 NOTE — ED Notes (Signed)
Lunch tray given. 

## 2020-03-26 NOTE — ED Notes (Signed)
Report given to Angie with [facility] 

## 2020-03-26 NOTE — ED Notes (Signed)
Report called to Arlys John, RN on 3W at [facility] and [facility] contacted for transport.

## 2020-03-26 NOTE — ED Notes (Signed)
The chaplain was rounding so I requested she see this patient because he had asked for patient experience and they were not available at this time.

## 2020-03-26 NOTE — ED Notes (Signed)
Hypoglycemic Event  CBG: 50  Treatment: 1 amp D50  Symptoms:none  Follow-up CBG: QQPY:1951 CBG Result:129  Possible Reasons for Event: patient NPO and maybe sliding scale is too much for him  Comments/MD notified:new orders    Gasper Lloyd

## 2020-03-26 NOTE — Progress Notes (Signed)
STROKE TEAM PROGRESS NOTE   INTERVAL HISTORY 65 yo M with Hx uncontrolled HTN and DM, HLD, polysubstance abuse (cocaine THC, ETOH and opioids x 40 years), last hospitalized 03/2019 for epiglotittis after swallowing two grams of crack cocaine to avoid arrest, smoker of cigarettes and mariuana, major depressive disorder presented to [facility] ER for abdominal pain for the 3rd time in the last 2 weeks. On arrival to ED, he was found to have elevated troponins, hyperglycemic with blood glucose in 500 to 600.  hemoglobin A1c of 10.6,  Cardiology consulted for NSTEMI , elevated troponins, started on IV heparin. Pt also reported right hand weakness started about 3 days prior to admission. MRI brain showed Multifocal acute ischemia within both cerebellar hemispheres, the right occipital lobe and both frontal lobes. Neurology consulted and further stroke work up ordered.  Pt was s transferred to [facility] for further work up and management this afternoon.  Today he report ongoing right hand numbnes and tingling distal to wrist involving entire hand. He is right handed.Reports continued right hand weakness and impaired coordination, dropping things along with walking difficulty. Reports "My equilibrium is messed up" x 2 weeks, describes as worsening progressively over that time period. Having to brace himself on the wall to walk, losing balance for a couple of days prior to admission. At first he had difficult sorting out drug/ETOH effects vs. new symptoms but over time the symptoms persisted even with no drugs on board which prompted him to present to emergency department.  Current every day cigarette smoker of 6 cigarettes per day. Last smoked crack cocaine 3/8. Last ETOH 3/8.  He drinks daily at least 6 beers or more daily. He denies family history of stroke. Also smoking THC intermittently. Denies withdrawal symptoms currently.   He describes his A1C as improved from previous level of 17. Last on file is March of  last year at 14. He reports he takes his medications daily. He does not check his blood glucose but has a meter. He feels he can sense his blood sugar level by the way he feels and does not need to check it.   Also reports loss of short-term memory so cannot provide reliable history.  Vitals:   03/26/20 1000 03/26/20 1100 03/26/20 1200 03/26/20 1300  BP: (!) 152/94 (!) 102/54 122/71 110/65  Pulse: 96 87 89 91  Resp: 16 15 20 14   Temp:      TempSrc:      SpO2: 95% 100% 100% 99%   CBC:  Recent Labs  Lab 03/25/20 1420 03/25/20 1446 03/26/20 0446  WBC 6.8  --  8.0  NEUTROABS 5.3  --   --   HGB 16.1 17.7* 12.6*  HCT 48.6 52.0 38.8*  MCV 86.9  --  87.6  PLT 243  --  192   Basic Metabolic Panel:  Recent Labs  Lab 03/25/20 1603 03/25/20 2315 03/26/20 0135 03/26/20 0446  NA 130*   < > 131* 132*  K 3.9   < > 3.9 3.7  CL 92*   < > 98 97*  CO2 28   < > 26 27  GLUCOSE 365*   < > 121* 115*  BUN 21   < > 18 18  CREATININE 1.00   < > 0.93 0.86  CALCIUM 8.6*   < > 8.2* 8.3*  MG 2.1  --   --   --   PHOS 2.8  --   --   --    < > =  values in this interval not displayed.   Lipid Panel:  Recent Labs  Lab 03/26/20 0446  CHOL 205*  TRIG 53  HDL 57  CHOLHDL 3.6  VLDL 11  LDLCALC 970*   HgbA1c:  Recent Labs  Lab 03/26/20 0446  HGBA1C 10.6*   Urine Drug Screen:  Recent Labs  Lab 03/25/20 1545  LABOPIA POSITIVE*  COCAINSCRNUR POSITIVE*  LABBENZ NONE DETECTED  AMPHETMU NONE DETECTED  THCU POSITIVE*  LABBARB NONE DETECTED    Alcohol Level No results for input(s): ETH in the last 168 hours.  IMAGING past 24 hours CT Head Wo Contrast  Result Date: 03/25/2020 CLINICAL DATA:  Right-sided weakness. EXAM: CT HEAD WITHOUT CONTRAST TECHNIQUE: Contiguous axial images were obtained from the base of the skull through the vertex without intravenous contrast. COMPARISON:  April 06, 2019 and January 18, 2019 FINDINGS: Brain: No evidence of acute infarction, hemorrhage, hydrocephalus,  extra-axial collection or mass lesion/mass effect. A small, stable area of white matter low attenuation is seen within the left occipital lobe. There is no evidence of associated mass effect or midline shift. While this is stable in appearance when compared to the prior plain brain CT, dated April 06, 2019, it does represent a new finding when compared to the earlier exam from January 18, 2019. Vascular: No hyperdense vessel or unexpected calcification. Skull: A chronic left-sided nasal bone fracture is seen. Sinuses/Orbits: No acute finding. Other: Mild left posterior parietal scalp soft tissue swelling is seen. IMPRESSION: 1. Findings suggestive of a small, stable chronic left occipital lobe infarct. MRI correlation is recommended. 2. No acute intracranial abnormality. Electronically Signed   By: Aram Candela M.D.   On: 03/25/2020 19:27   MR BRAIN WO CONTRAST  Result Date: 03/25/2020 CLINICAL DATA:  Right arm weakness EXAM: MRI HEAD WITHOUT CONTRAST TECHNIQUE: Multiplanar, multiecho pulse sequences of the brain and surrounding structures were obtained without intravenous contrast. COMPARISON:  Head CT 03/25/2020 FINDINGS: Brain: There is multifocal abnormal diffusion restriction within both cerebellar hemispheres, the right occipital lobe and both frontal lobes. No acute or chronic hemorrhage. There is multifocal hyperintense T2-weighted signal within the white matter. Parenchymal volume and CSF spaces are normal. The midline structures are normal. Vascular: Major flow voids are preserved. Skull and upper cervical spine: Normal calvarium and skull base. Visualized upper cervical spine and soft tissues are normal. Sinuses/Orbits:No paranasal sinus fluid levels or advanced mucosal thickening. No mastoid or middle ear effusion. Normal orbits. IMPRESSION: Multifocal acute ischemia within both cerebellar hemispheres, the right occipital lobe and both frontal lobes. No hemorrhage or mass effect. Electronically  Signed   By: Deatra Robinson M.D.   On: 03/25/2020 22:10   CT Angio Chest/Abd/Pel for Dissection W and/or Wo Contrast  Result Date: 03/25/2020 CLINICAL DATA:  Abdominal pain, concern for aortic dissection EXAM: CT ANGIOGRAPHY CHEST, ABDOMEN AND PELVIS TECHNIQUE: Non-contrast CT of the chest was initially obtained. Multidetector CT imaging through the chest, abdomen and pelvis was performed using the standard protocol during bolus administration of intravenous contrast. Multiplanar reconstructed images and MIPs were obtained and reviewed to evaluate the vascular anatomy. CONTRAST:  OMNIPAQUE IOHEXOL 350 MG/ML SOLN COMPARISON:  03/14/2020 FINDINGS: CTA CHEST FINDINGS Cardiovascular: Stable caliber of the thoracic aorta with atherosclerotic plaque. No evidence of intramural hematoma or dissection. Noncalcified plaque along the proximal left subclavian causes 50% stenosis. No evidence of central pulmonary embolus. Normal heart size. Coronary artery calcification. No pericardial effusion. Mediastinum/Nodes: No enlarged lymph nodes identified. Thyroid and esophagus are unremarkable.  Lungs/Pleura: Mild paraseptal emphysema. No new consolidation. No pleural effusion or pneumothorax. Musculoskeletal: No acute osseous abnormality. Review of the MIP images confirms the above findings. CTA ABDOMEN AND PELVIS FINDINGS VASCULAR Aorta: Normal in caliber. Diffuse mixed but primarily calcified plaque. No evidence of dissection. As before, there is marked plaque at the bifurcation with luminal narrowing. Celiac: Patent origin with stable minimal narrowing. SMA: Patent origin with stable mild narrowing. Renals: Patent with stable at least moderate narrowing of the left renal artery origin. Patent small bilateral accessory renal arteries. The inferior right accessory renal artery is small in caliber with likely significant origin stenosis. IMA: Patent with stable moderate to marked stenosis at the origin. Inflow: Patent common  iliacs with normal caliber and mixed but primarily calcified plaque. Patent internal and external iliacs. Veins: Not well evaluated. Review of the MIP images confirms the above findings. NON-VASCULAR Hepatobiliary: No significant abnormality. Pancreas: Unremarkable. Spleen: Unremarkable Adrenals/Urinary Tract: Adrenals are unremarkable. There is persistent hypoenhancement of the lower pole of the right kidney. Additional small areas of hypoenhancement are present within the cortex in the interpolar region. Punctate nonobstructing calculus of the interpolar right kidney. Bladder is unremarkable. Stomach/Bowel: Stomach is within normal limits. Bowel is normal in caliber. Paucity of mesenteric fat somewhat limits evaluation. Lymphatic: No enlarged lymph nodes identified. Reproductive: Unremarkable. Other: No ascites.  Abdominal wall is unremarkable. Musculoskeletal: No acute osseous abnormality. Review of the MIP images confirms the above findings. IMPRESSION: No substantial change since 03/14/2020. There is no evidence of aortic dissection. Significant atherosclerosis throughout. As before, there is stenosis at the abdominal aorta branch origins and bifurcation. Hypoperfusion of the inferior right kidney likely with some areas of infarction as before. There is a small caliber accessory right renal artery primarily supplying the lower pole with high-grade origin stenosis. Electronically Signed   By: Guadlupe SpanishPraneil  Patel M.D.   On: 03/25/2020 16:41    Prior CT abdomen pelvis February 2022 concerning for some degree of right kidney lower pole infarction.-Versus pyelonephritis.  PHYSICAL EXAM  Pulse Rate:  [83-109] 91 (03/10 1300) Resp:  [10-26] 14 (03/10 1300) BP: (100-225)/(50-136) 110/65 (03/10 1300) SpO2:  [95 %-100 %] 99 % (03/10 1300)  General - Thin, well developed elderly male Cardiovascular - Regular rhythm and rate showing on tele  Mental Status -  Alert, oriented x4. Sitting up in bed eating crackers  in NAD. Language including expression, naming, repetition, comprehension was assessed and found intact.  Attention span and concentration were normal.  Recent and remote memory were intact.   Cranial Nerves II - XII - II - Visual field intact OU. III, IV, VI - Extraocular movements intact.  V - Facial sensation impaired d/t reported old injury with numbness from left forehead to left mid cheek.  VII - Facial movement intact bilaterally.  VIII - Hearing intact to voice.  X - Palate elevates symmetrically.  XI - Chin turning & shoulder shrug intact bilaterally.  XII - Tongue protrusion intact.   Motor Strength - Mild weakness in R grip 4+, otherwise 5/5 RUE. RLE is full strength. L hemibody is full strength. No pronator drift.  Motor Tone - Muscle tone was normal. Sensory - Entire right hand with decreased sensation from wrist to fingertips.  Coordination -  FTN and HTS intact  Gait and Station - deferred.   Assessment and plan   Stroke: Multifocal small or punctate acute and subacute infarcts involving right cerebellum, right ACA and MCA/PCA and b/l MCA/ACA regions, no hemorrhagic conversion.  Definitely cardioembolic pattern. Along with his renal infarct, this is concerning for LV thrombus, endocarditis, or RV thrombus with PFO.    CT head code Stroke :Findings suggestive of a small, stable chronic left occipital lobe infarct. MRI correlation is recommended.  CTA  head & neck is PENDING  Brain MRI done showed multifocal small or punctate acute and subacute infarcts involving right cerebellum, right ACA and MCA/PCA and b/l MCA/ACA regions, no hemorrhagic conversion.  2D Echo: EF 65-70% No thrombus, wall motion abnormality or shunt found. +mild LVH  VTE prophylaxis - on heparin gtt    Diet   Diet heart healthy/carb modified Room service appropriate? Yes; Fluid consistency: Thin     Given the small size of stroke and subacute appearance, OK with heparin IV and ASA 81 at this  time per cardiology.   No AC/AP prior to admission  Would recommend TEE  Obtain blood c/s  Therapy recommendations:  TBD  Disposition:  TBD  Hypertension Renal artery stenosis . No need for permissive hypertension as symptoms are at least 64 days old, may be more . Long-term BP goal normotensive  Hyperlipidemia  Home meds:  Lipitor 20mg    LDL 137, goal < 70  High intensity statin: Increase Lipitor dose to 40mg    Continue statin at discharge  Diabetes type II Uncontrolled with HHNC  Management per primary team  HgbA1c 10.6, goal < 7.0  CBGs Recent Labs    03/26/20 0757 03/26/20 0849 03/26/20 1201  GLUCAP 50* 129* 250*      SSI  Close PCP  follow up for continued management  NSTEMI:   Management per cardiology/primary team  On heparin gtt  EKG shows right axis deviation.   On ASA 81mg   Elevated troponins  Renal 05/26/20  Other Stroke Risk Factors  Advanced Age >/= 56   Cigarette, crack cocaine and marijuana smoker.   ETOH abuse:chronic drink every day 6 or more beers  Substance abuse - UDS:  THC POSITIVE, Cocaine POSITIVE. Patient advised to stop using due to stroke risk.  ?coronary artery disease   Other Active Problems  Polysubstance Abuse -Currently denying withdrawal s/s -Consider [facility] day # 1   Attending Neurohospitalist Addendum Patient seen and examined with APP/Resident. Agree with the history and physical as documented above. Agree with the plan as documented, which I helped formulate. I have independently reviewed the chart, obtained history, review of systems and examined the patient.I have personally reviewed pertinent head/neck/spine imaging (CT/MRI).  Briefly, patient evaluated by telemedicine neurology, with a last known well of at least 3 days ago may be more-patient unreliable historian.  Multifocal infarcts in bilateral anterior and posterior circulations. On examination, subtle right hand weakness,  ataxia. Also found to have NSTEMI. Prior scans of the abdomen with concerns for renal artery stenosis and renal infarction. History of polysubstance abuse-urinary toxicology screen positive for cocaine. Likely multifactorial etiology of strokes-but more concerning is if there is underlying infective endocarditis.  Transthoracic echo unremarkable Would benefit from a TEE to complete evaluation. Stroke work-up as above.  Counseled on smoking and illicit drug use cessation.  He seems rather unwilling at this time. Plan d/w his attending hospitalist-Dr. Arrien    -- [place], MD Stroke Neurology Pager: [phone]

## 2020-03-26 NOTE — Progress Notes (Signed)
Chaplain engaged in an initial visit with William Acevedo.  William Acevedo shared with chaplain that he wanted to contact patient experience or an advocate because of feeling like he was being unheard.  William Acevedo vocalized being stuck multiple times with a needle because of his veins being small.  He expressed that he wanted to be put to sleep rather than endure that pain of being stuck by a needle continuously.  He seemed to also be vocalizing that he wanted the nurses, nurse techs, and physicians to listen to him about what he knows about his body and what he desires to happen concerning his body.  William Acevedo overall seemed to convey that he hates to experience any pain which is why he also takes cocaine.  He noted that cocaine has managed his pain for the last 45 years.  He doesn't feel or think about anything when he is able to use.  William Acevedo also seems to be in a place of recognizing that he needs to find out what is causing his pain rather than just managing it.  Chaplain asked William Acevedo if he thought his cocaine use has anything to do with his pain and he stated that he has used for so long that he believes that it probably has worn down something in his body.  Ultimately, William Acevedo is open to finding answers.  Chaplain offered ministries of presence, listening, and prayer.  Chaplain affirmed William Acevedo's thoughts around being heard when it comes to his health.  Chaplain also offered support as he navigates what is happening to his body.    03/26/20 1200  Clinical Encounter Type  Visited With Patient  Visit Type Initial

## 2020-03-26 NOTE — Plan of Care (Signed)
  Problem: Education: Goal: Knowledge of disease or condition will improve 03/26/2020 2035 by Becky Augusta, RN Outcome: Adequate for Discharge 03/26/2020 2035 by Becky Augusta, RN Outcome: Adequate for Discharge 03/26/2020 2034 by Becky Augusta, RN Outcome: Adequate for Discharge Goal: Knowledge of secondary prevention will improve 03/26/2020 2035 by Becky Augusta, RN Outcome: Adequate for Discharge 03/26/2020 2034 by Becky Augusta, RN Outcome: Adequate for Discharge Goal: Knowledge of patient specific risk factors addressed and post discharge goals established will improve 03/26/2020 2035 by Becky Augusta, RN Outcome: Adequate for Discharge 03/26/2020 2034 by Becky Augusta, RN Outcome: Adequate for Discharge

## 2020-03-27 ENCOUNTER — Inpatient Hospital Stay (HOSPITAL_COMMUNITY): Payer: No Typology Code available for payment source

## 2020-03-27 ENCOUNTER — Other Ambulatory Visit: Payer: Self-pay

## 2020-03-27 DIAGNOSIS — I6330 Cerebral infarction due to thrombosis of unspecified cerebral artery: Secondary | ICD-10-CM | POA: Diagnosis not present

## 2020-03-27 DIAGNOSIS — F1910 Other psychoactive substance abuse, uncomplicated: Secondary | ICD-10-CM | POA: Diagnosis not present

## 2020-03-27 DIAGNOSIS — E1301 Other specified diabetes mellitus with hyperosmolarity with coma: Secondary | ICD-10-CM

## 2020-03-27 DIAGNOSIS — E43 Unspecified severe protein-calorie malnutrition: Secondary | ICD-10-CM | POA: Diagnosis not present

## 2020-03-27 DIAGNOSIS — E1159 Type 2 diabetes mellitus with other circulatory complications: Secondary | ICD-10-CM | POA: Diagnosis not present

## 2020-03-27 DIAGNOSIS — F1490 Cocaine use, unspecified, uncomplicated: Secondary | ICD-10-CM | POA: Diagnosis not present

## 2020-03-27 LAB — GLUCOSE, CAPILLARY
Glucose-Capillary: 43 mg/dL — CL (ref 70–99)
Glucose-Capillary: 72 mg/dL (ref 70–99)
Glucose-Capillary: 121 mg/dL — ABNORMAL HIGH (ref 70–99)
Glucose-Capillary: 101 mg/dL — ABNORMAL HIGH (ref 70–99)
Glucose-Capillary: 273 mg/dL — ABNORMAL HIGH (ref 70–99)
Glucose-Capillary: 200 mg/dL — ABNORMAL HIGH (ref 70–99)
Glucose-Capillary: 169 mg/dL — ABNORMAL HIGH (ref 70–99)

## 2020-03-27 LAB — CBC
HCT: 36.5 % — ABNORMAL LOW (ref 39.0–52.0)
Hemoglobin: 12.2 g/dL — ABNORMAL LOW (ref 13.0–17.0)
MCH: 28.8 pg (ref 26.0–34.0)
MCHC: 33.4 g/dL (ref 30.0–36.0)
MCV: 86.1 fL (ref 80.0–100.0)
Platelets: 210 10*3/uL (ref 150–400)
RBC: 4.24 MIL/uL (ref 4.22–5.81)
RDW: 12.1 % (ref 11.5–15.5)
WBC: 8.5 10*3/uL (ref 4.0–10.5)
nRBC: 0 % (ref 0.0–0.2)

## 2020-03-27 LAB — BASIC METABOLIC PANEL
Anion gap: 5 (ref 5–15)
BUN: 12 mg/dL (ref 8–23)
CO2: 32 mmol/L (ref 22–32)
Calcium: 8.1 mg/dL — ABNORMAL LOW (ref 8.9–10.3)
Chloride: 97 mmol/L — ABNORMAL LOW (ref 98–111)
Creatinine, Ser: 0.99 mg/dL (ref 0.61–1.24)
GFR, Estimated: >60 mL/min (ref >60)
Glucose, Bld: 150 mg/dL — ABNORMAL HIGH (ref 70–99)
Potassium: 4.4 mmol/L (ref 3.5–5.1)
Sodium: 134 mmol/L — ABNORMAL LOW (ref 135–145)

## 2020-03-27 LAB — HEPARIN LEVEL (UNFRACTIONATED): Heparin Unfractionated: 0.52 IU/mL (ref 0.30–0.70)

## 2020-03-27 LAB — MAGNESIUM: Magnesium: 1.9 mg/dL (ref 1.7–2.4)

## 2020-03-27 LAB — PHOSPHORUS: Phosphorus: 3.5 mg/dL (ref 2.5–4.6)

## 2020-03-27 MED ORDER — WHITE PETROLATUM EX OINT
TOPICAL_OINTMENT | CUTANEOUS | Status: AC
  Administered 2020-03-27: 0.2
  Filled 2020-03-27: qty 28.35

## 2020-03-27 MED ORDER — INSULIN GLARGINE 100 UNIT/ML ~~LOC~~ SOLN
5.0000 [IU] | Freq: Every day | SUBCUTANEOUS | Status: DC
  Administered 2020-03-27: 5 [IU] via SUBCUTANEOUS
  Filled 2020-03-27 (×2): qty 0.05

## 2020-03-27 MED ORDER — SIMETHICONE 80 MG PO CHEW
80.0000 mg | CHEWABLE_TABLET | Freq: Four times a day (QID) | ORAL | Status: DC | PRN
Start: 2020-03-27 — End: 2020-03-30
  Administered 2020-03-27 – 2020-03-28 (×2): 80 mg via ORAL
  Filled 2020-03-27 (×3): qty 1

## 2020-03-27 MED ORDER — CYCLOBENZAPRINE HCL 10 MG PO TABS
5.0000 mg | ORAL_TABLET | Freq: Three times a day (TID) | ORAL | Status: DC
  Administered 2020-03-27 – 2020-03-30 (×10): 5 mg via ORAL
  Filled 2020-03-27 (×10): qty 1

## 2020-03-27 MED ORDER — ADULT MULTIVITAMIN W/MINERALS CH
1.0000 | ORAL_TABLET | Freq: Every day | ORAL | Status: DC
  Administered 2020-03-27 – 2020-03-30 (×4): 1 via ORAL
  Filled 2020-03-27 (×4): qty 1

## 2020-03-27 MED ORDER — ENSURE ENLIVE PO LIQD
237.0000 mL | Freq: Three times a day (TID) | ORAL | Status: DC
  Administered 2020-03-27 – 2020-03-30 (×10): 237 mL via ORAL

## 2020-03-27 MED ORDER — IOHEXOL 350 MG/ML SOLN
75.0000 mL | Freq: Once | INTRAVENOUS | Status: AC | PRN
  Administered 2020-03-27: 75 mL via INTRAVENOUS

## 2020-03-27 MED ORDER — SUCRALFATE 1 GM/10ML PO SUSP
1.0000 g | Freq: Three times a day (TID) | ORAL | Status: DC
  Administered 2020-03-27 – 2020-03-30 (×8): 1 g via ORAL
  Filled 2020-03-27 (×16): qty 10

## 2020-03-27 MED ORDER — ATORVASTATIN CALCIUM 40 MG PO TABS
40.0000 mg | ORAL_TABLET | Freq: Every day | ORAL | Status: DC
  Administered 2020-03-27 – 2020-03-30 (×4): 40 mg via ORAL
  Filled 2020-03-27 (×4): qty 1

## 2020-03-27 NOTE — Progress Notes (Signed)
Inpatient Diabetes Program Recommendations  AACE/ADA: New Consensus Statement on Inpatient Glycemic Control (2015)  Target Ranges:  Prepandial:   less than 140 mg/dL      Peak postprandial:   less than 180 mg/dL (1-2 hours)      Critically ill patients:  140 - 180 mg/dL   Lab Results  Component Value Date   GLUCAP 200 (H) 03/27/2020   HGBA1C 10.6 (H) 03/26/2020    Review of Glycemic Control  Diabetes history: DM 2 Outpatient Diabetes medications: Lantus 40 units qd, Novolog 5 units tid, Glucotrol 10 mg Daily, jardiance 25 mg Daily Current orders for Inpatient glycemic control:  Lantus 5 units qhs Novolog 0-9 units tid + hs  Ensure enlive tid between meals  Inpatient Diabetes Program Recommendations:    Labile glucose trends. Hypoglycemia the past 2 mornings agree with Lantus reduction to 5 units.   -  If glucose trends increase with meal intake consider adding low dose Novolog meal coverage ( pt seems sensitive to high doses of Novolog.  Spoke with pt at bedside regarding A1c 10%. Pt says it looks better than before. Pt also reports using cocaine medicinally for pain and other medical issues. Pt reports taking his oral meds all the time and Novolog, however he doses his Lantus based on high or low glucose levels. Pt does not check his glucose but knows when his glucose is high or low. Pt reports labile glucose trends at home. Pt has had DM over 20 years.  Explained to pt we will try to level out his glucose trends while inpatient.  Thanks,  Christena Deem RN, MSN, BC-ADM Inpatient Diabetes Coordinator Team Pager [phone] (8a-5p)

## 2020-03-27 NOTE — Progress Notes (Addendum)
STROKE TEAM PROGRESS NOTE   INTERVAL HISTORY  Heparin drip discontinued by the primary team Patient reports he is having abdominal pain around his bellybutton and cannot answer questions or participate in exam due to the pain. With some coaxing he does answer orientation questions and  is able to deny any headache, new weakness/numbness, trouble talking or any other new symptom. He feels his right hand symptoms of numbness and weakness are unchanged.  Per discussion with RN primary team to be notified of abd pain.    Vitals:   03/26/20 2005 03/26/20 2330 03/27/20 0415 03/27/20 0745  BP: 129/77 119/80 (!) 163/98 (!) 158/81  Pulse: 92 (!) 101 94 86  Resp: 14 19 16 12   Temp: 98.1 F (36.7 C) 98 F (36.7 C) (!) 97.4 F (36.3 C) 97.8 F (36.6 C)  TempSrc: Oral Oral Oral Oral  SpO2: 100% 99% 100% 100%   CBC:  Recent Labs  Lab 03/25/20 1420 03/25/20 1446 03/26/20 0446 03/27/20 0341  WBC 6.8  --  8.0 8.5  NEUTROABS 5.3  --   --   --   HGB 16.1   < > 12.6* 12.2*  HCT 48.6   < > 38.8* 36.5*  MCV 86.9  --  87.6 86.1  PLT 243  --  192 210   < > = values in this interval not displayed.   Basic Metabolic Panel:  Recent Labs  Lab 03/25/20 1603 03/25/20 2315 03/26/20 0135 03/26/20 0446  NA 130*   < > 131* 132*  K 3.9   < > 3.9 3.7  CL 92*   < > 98 97*  CO2 28   < > 26 27  GLUCOSE 365*   < > 121* 115*  BUN 21   < > 18 18  CREATININE 1.00   < > 0.93 0.86  CALCIUM 8.6*   < > 8.2* 8.3*  MG 2.1  --   --   --   PHOS 2.8  --   --   --    < > = values in this interval not displayed.   Lipid Panel:  Recent Labs  Lab 03/26/20 0446  CHOL 205*  TRIG 53  HDL 57  CHOLHDL 3.6  VLDL 11  LDLCALC 05/26/20*   HgbA1c:  Recent Labs  Lab 03/26/20 0446  HGBA1C 10.6*   Urine Drug Screen:  Recent Labs  Lab 03/25/20 1545  LABOPIA POSITIVE*  COCAINSCRNUR POSITIVE*  LABBENZ NONE DETECTED  AMPHETMU NONE DETECTED  THCU POSITIVE*  LABBARB NONE DETECTED    Alcohol Level No results for  input(s): ETH in the last 168 hours.  IMAGING past 24 hours ECHOCARDIOGRAM COMPLETE  Result Date: 03/26/2020    ECHOCARDIOGRAM REPORT   Patient Name:   NICKALUS THORNSBERRY Date of Exam: 03/26/2020 Medical Rec #:  05/26/2020           Height:       66.0 in Accession #:    [id number]          Weight:       118.0 lb Date of Birth:  09/07/1955           BSA:          1.598 m Patient Age:    65 years            BP:           122/71 mmHg Patient Gender: M  HR:           89 bpm. Exam Location:  Inpatient Procedure: 2D Echo, Color Doppler and Cardiac Doppler Indications:    NSTEMI  History:        Patient has no prior history of Echocardiogram examinations.                 Risk Factors:Diabetes, Hypertension, elevated troponin and                 Current Smoker. Polysubstance abuse.  Sonographer:    Lavenia AtlasBrooke Strickland Referring Phys: [id number] RAMESH KC IMPRESSIONS  1. Left ventricular ejection fraction, by estimation, is 65 to 70%. The left ventricle has normal function. The left ventricle has no regional wall motion abnormalities. There is mild concentric left ventricular hypertrophy. Left ventricular diastolic parameters are indeterminate.  2. Right ventricular systolic function is normal. The right ventricular size is normal. Tricuspid regurgitation signal is inadequate for assessing PA pressure.  3. The mitral valve is abnormal. Mild mitral valve regurgitation. No evidence of mitral stenosis. Moderate to severe mitral annular calcification.  4. The aortic valve has an indeterminant number of cusps. Aortic valve regurgitation is not visualized.  5. The inferior vena cava is normal in size with greater than 50% respiratory variability, suggesting right atrial pressure of 3 mmHg. Comparison(s): No prior Echocardiogram. Conclusion(s)/Recommendation(s): Normal biventricular function without evidence of hemodynamically significant valvular heart disease. FINDINGS  Left Ventricle: Left ventricular ejection  fraction, by estimation, is 65 to 70%. The left ventricle has normal function. The left ventricle has no regional wall motion abnormalities. The left ventricular internal cavity size was normal in size. There is  mild concentric left ventricular hypertrophy. Left ventricular diastolic parameters are indeterminate. Right Ventricle: The right ventricular size is normal. No increase in right ventricular wall thickness. Right ventricular systolic function is normal. Tricuspid regurgitation signal is inadequate for assessing PA pressure. Left Atrium: Left atrial size was normal in size. Right Atrium: Right atrial size was normal in size. Prominent Eustachian valve. Pericardium: There is no evidence of pericardial effusion. Mitral Valve: The mitral valve is abnormal. Moderate to severe mitral annular calcification. Mild mitral valve regurgitation. No evidence of mitral valve stenosis. Tricuspid Valve: The tricuspid valve is normal in structure. Tricuspid valve regurgitation is trivial. No evidence of tricuspid stenosis. Aortic Valve: The aortic valve has an indeterminant number of cusps. Aortic valve regurgitation is not visualized. Pulmonic Valve: The pulmonic valve was not well visualized. Pulmonic valve regurgitation is not visualized. Aorta: The aortic root, ascending aorta, aortic arch and descending aorta are all structurally normal, with no evidence of dilitation or obstruction. Venous: The inferior vena cava is normal in size with greater than 50% respiratory variability, suggesting right atrial pressure of 3 mmHg. IAS/Shunts: The atrial septum is grossly normal.  LEFT VENTRICLE PLAX 2D LVIDd:         3.80 cm  Diastology LVIDs:         2.20 cm  LV e' medial:    5.66 cm/s LV PW:         1.10 cm  LV E/e' medial:  13.9 LV IVS:        1.10 cm  LV e' lateral:   6.85 cm/s LVOT diam:     1.80 cm  LV E/e' lateral: 11.5 LV SV:         68 LV SV Index:   42 LVOT Area:     2.54 cm  RIGHT VENTRICLE RV Basal diam:  2.70 cm RV  S prime:     11.40 cm/s TAPSE (M-mode): 2.5 cm LEFT ATRIUM             Index       RIGHT ATRIUM          Index LA diam:        2.50 cm 1.56 cm/m  RA Area:     9.23 cm LA Vol (A2C):   45.2 ml 28.28 ml/m RA Volume:   20.00 ml 12.52 ml/m LA Vol (A4C):   30.2 ml 18.90 ml/m LA Biplane Vol: 40.7 ml 25.47 ml/m  AORTIC VALVE LVOT Vmax:   101.00 cm/s LVOT Vmean:  72.300 cm/s LVOT VTI:    0.266 m  AORTA Ao Root diam: 3.10 cm MITRAL VALVE MV Area (PHT): 4.06 cm     SHUNTS MV Decel Time: 187 msec     Systemic VTI:  0.27 m MV E velocity: 78.80 cm/s   Systemic Diam: 1.80 cm MV A velocity: 112.00 cm/s MV E/A ratio:  0.70 Jodelle Red MD Electronically signed by Jodelle Red MD Signature Date/Time: 03/26/2020/2:01:22 PM    Final     Prior CT abdomen pelvis February 2022 concerning for some degree of right kidney lower pole infarction.-Versus pyelonephritis.  PHYSICAL EXAM  Temp:  [97.4 F (36.3 C)-98.3 F (36.8 C)] 97.8 F (36.6 C) (03/11 0745) Pulse Rate:  [86-101] 86 (03/11 0745) Resp:  [10-26] 12 (03/11 0745) BP: (97-163)/(63-98) 158/81 (03/11 0745) SpO2:  [98 %-100 %] 100 % (03/11 0745)  General - Thin, well developed elderly male Cardiovascular - Regular rhythm and rate showing on tele  Mental Status -  Alert, oriented x4. Sitting up in bed eating crackers in NAD. Language including expression, naming, repetition, comprehension was assessed and found intact.  Attention span and concentration were normal.  Recent and remote memory were intact.   Cranial Nerves II - XII - II - Visual field intact OU. III, IV, VI - Extraocular movements intact.  V - Facial sensation impaired d/t reported old injury with numbness from left forehead to left mid cheek.  VII - Facial movement intact bilaterally.  VIII - Hearing intact to voice.  X - Palate elevates symmetrically.  XI - Chin turning & shoulder shrug intact bilaterally.  XII - Tongue protrusion intact.   Motor Strength - Mild  weakness in R grip 4+, otherwise 5/5 RUE. RLE is full strength. L hemibody is full strength. No pronator drift.  Motor Tone - Muscle tone was normal. Sensory - Entire right hand with decreased sensation from wrist to fingertips.  Coordination -  FTN and HTS intact  Gait and Station - deferred.   Assessment and plan   Stroke: Multifocal small or punctate acute and subacute infarcts involving right cerebellum, right ACA and MCA/PCA and b/l MCA/ACA regions, no hemorrhagic conversion. Definitely cardioembolic pattern. Along with his renal infarct, rule out LV thrombus, endocarditis, or RV thrombus with PFO.    CT head code Stroke :Findings suggestive of a small, stable chronic left occipital lobe infarct. MRI correlation is recommended.  CTA  head & neck is PENDING  Brain MRI done showed multifocal small or punctate acute and subacute infarcts involving right cerebellum, right ACA and MCA/PCA and b/l MCA/ACA regions, no hemorrhagic conversion.  2D Echo: EF 65-70% No thrombus, wall motion abnormality or shunt found. +mild LVH  VTE prophylaxis - on heparin gtt    Diet   Diet heart healthy/carb modified Room service appropriate? Yes; Fluid  consistency: Thin    Given the small size of stroke and subacute appearance, OK with heparin IV and ASA 81mg    No AC/AP prior to admission  Would recommend TEE   Obtain blood c/s  Therapy recommendations:  TBD  Disposition:  TBD  Hypertension Renal artery stenosis . No need for permissive hypertension as symptoms are at least 12 days old, maybe longer  . Long-term BP goal normotensive  Hyperlipidemia  Home meds:  Lipitor 20mg    LDL 137, goal < 70  High intensity statin: Increase Lipitor dose to 40mg    Continue statin at discharge  Diabetes type II Uncontrolled with HHNC  Management per primary team  HgbA1c 10.6, goal < 7.0  CBGs Recent Labs    03/27/20 0427 03/27/20 0554 03/27/20 0739  GLUCAP 72 121* 101*       SSI  Close PCP  follow up for continued management  NSTEMI:   Management per cardiology/primary team  On heparin gtt  EKG shows right axis deviation.   On ASA 81mg   Elevated troponins  Hyponatremia Na 130->130_>131->132 -May be chronic in settting of ETOH abuse. ED/hospital v visits on file for the past two years show mulitple low Na values (127-134)  Other Stroke Risk Factors  Advanced Age >/= 20   Cigarette, crack cocaine and marijuana smoker.   ETOH abuse:chronic drink every day 6 or more beers  Substance abuse - UDS:  THC POSITIVE, Cocaine POSITIVE. Patient advised to stop using due to stroke risk.  ?coronary artery disease   Other Active Problems  Polysubstance Abuse -Currently denying withdrawal s/s -Consider [facility] day # 2  Attending addendum Patient seen and examined Reports no complaints On examination, alert awake alert oriented x3.  Reports his abdominal pain is better after he received medication.  No aphasia.  Mildly dysarthric.  No cranial nerve deficits.  5/5 in all 4 extremities with no sensory deficits.  No dysmetria.  Most of the work-up was completed as above Pending TEE given the appearance of stroke and polysubstance abuse history to rule out valve vegetation/endocarditis.  Was on aspirin and also on heparin drip for NSTEMI.  Deemed to be demand ischemia and hence heparin discontinued.  Given that we are still working him up for endocarditis - I would do ASA only and not DAPT  CTA head and neck pending to look for vascular lesions or mycotic aneurysms-reordered and verified that the orders are received by the CT department.  They will be doing that at some point tonight.  Continue aspirin for now.  TEE scheduled for Monday from my last conversation with the scheduler.  We will follow after the TEE results become available.  Discussed with Dr. .  -- 76, MD Stroke Neurology Pager: [phone]

## 2020-03-27 NOTE — Progress Notes (Addendum)
PROGRESS NOTE    William Acevedo  SNK:[id number] DOB: June 21, 1955 DOA: 03/25/2020 PCP: Priscille Kluver, MD    Brief Narrative:  Mr. Sirmon was admitted to the hospital with working diagnosis of acute embolic stroke, in the setting of cocaine intoxication.   65 year old male past medical history for type 2 diabetes mellitus, hypertension and dyslipidemia.  He also has history of depressive disorder,and polysubstance abuse.  He developed abdominal pain, for several weeks, localized at the epigastrium. At home used cocaine to treat abdominal pain.  Reported loosing right hand grip, unable to handle small objects or tie his shoes.  Neurologic deficit acute onset about 5 hours prior to hospitalization. On his initial physical examination blood pressure 196/101, heart rate 103, respirate 16, oxygen saturation 98%. His lungs were clear to auscultation bilaterally, heart S1-S2, present rhythmic, soft abdomen, no lower extremity edema.  Sodium 128, potassium 4.0, chloride 86, bicarb 28, glucose 539, BUN 23, creatinine 1.23, troponin I 937-902.  White count 6.8, hemoglobin 16.1, hematocrit 40.6, platelets 243. SARS COVID-19 negative. Urinalysis negative for infection.  Toxicology screen positive for opiates, cocaine and tetrahydrocannabinol.  CT angiography no dissection.  Significant atherosclerosis.  Hypoperfusion of inferior right kidney, likely infarction. Head CT with small, stable chronic left occipital lobe infarct.  EKG 108 bpm, left axis deviation, normal intervals, sinus rhythm, Q-wave lead II, lead III and aVF, J-point elevation V3-V5, no significant T wave changes, positive LVH  Follow up workup with brain MRI with multifocal ischemia within both cerebellar hemispheres, right occipital lobe and both frontal lobes.   Assessment & Plan:   Principal Problem:   Cerebral thrombosis with cerebral infarction Active Problems:   MDD (major depressive disorder), recurrent episode,  mild (HCC)   Polysubstance abuse (HCC)   DM2 (diabetes mellitus, type 2) (HCC)   Cocaine use   Secondary diabetes mellitus with HHNC (hyperglycemia hyperosmolar non-ketotic coma) (HCC)   Protein-calorie malnutrition, severe   1. Acute ischemic infarct, multifocal, both cerebellar hemispheres, right occipital lobe and frontal lobes.  Patient continue to have paresthesias at his right arm but improved strength.   Continue medical management with aspirin and atorvastatin.   Echocardiogram transthoracic with no thrombus or vegetation. Plan for further work up with TEE on Monday.  Pending CTA head and neck.  Will need bubble study to rule out PFO Follow further recommendations from neurology, PT and OT.   2. Elevated troponin/ cocaine intoxication. Flat pattern troponin, EKG with no acute ischemic changes and echocardiogram with no wall motion abnormalities. Patient is chest pain free. Possible demand ischemia, not NSTEMI. Note patient positive for cocaine on admission.   Check troponin in am, and repeat ekg if chest pain.  Continue aspirin and atorvastatin.  Ok to discontinue heparin IV.   3. Abdominal pain. Pain is reproducible to palpation at the lower abdomen. Imaging has been negative for dissection.  Possible gastritis or ischemic event from cocaine use.   Continue with famotidine, (allergy to omeprazole) will add sucralfate. Add flexeril tid.    3. HTN. Continue blood pressure control with losartan.   4. T2DM uncontrolled hyper and hypoglycemia. Continue glucose cover and monitoring with insulin sliding scale.  Basal insulin will be reduced to 5 mg to prevent hypoglycemia,  Discontinue IV fluids, patient is tolerating po well.   Diabetic neuropathy on gabapentin 1200 mg qhs.   5. Severe calorie protein malnutrition. Continue with nutritional supplementation.   6. Depression/ tobacco abuse. Continue with amitriptyline.  Add nicotine patch 21 mg.  7. Chronic pain  syndrome. Continue hydrocodone as needed.   8. AKI. Renal function improved, today serum cr down to 0,99 with K at 4,4 and bicarbonate at 32. Low NA on admission, due to hyperglycemia.,   Patient continue to be at high risk for worsening CVA   Status is: Inpatient  Remains inpatient appropriate because:IV treatments appropriate due to intensity of illness or inability to take PO and Inpatient level of care appropriate due to severity of illness   Dispo: The patient is from: Home              Anticipated d/c is to: Home              Patient currently is not medically stable to d/c.   Difficult to place patient No   DVT prophylaxis: Enoxaparin   Code Status:   full  Family Communication:  No family at the bedside      Nutrition Status: Nutrition Problem: Severe Malnutrition Etiology: social / environmental circumstances Signs/Symptoms: severe fat depletion,severe muscle depletion Interventions: Ensure Enlive (each supplement provides 350kcal and 20 grams of protein),MVI,Liberalize Diet     Skin Documentation:     Consultants:   Neurology  Cardiology for TEE  Subjective: Patient is feeling better, no nausea or vomiting, continue to have abdominal pain to palpation, no chest pain or dyspnea,   Objective: Vitals:   03/27/20 0415 03/27/20 0745 03/27/20 1145 03/27/20 1459  BP: (!) 163/98 (!) 158/81 (!) 161/88   Pulse: 94 86 86   Resp: 16 12 15    Temp: (!) 97.4 F (36.3 C) 97.8 F (36.6 C) 98.5 F (36.9 C)   TempSrc: Oral Oral Oral   SpO2: 100% 100% 100%   Weight:    55.6 kg  Height:    5\' 6"  (1.676 m)    Intake/Output Summary (Last 24 hours) at 03/27/2020 1550 Last data filed at 03/27/2020 0610 Gross per 24 hour  Intake 1823.08 ml  Output 1250 ml  Net 573.08 ml   Filed Weights   03/27/20 1459  Weight: 55.6 kg    Examination:   General: Not in pain or dyspnea, Neurology: Awake and alert, non focal  E ENT: no pallor, no icterus, oral mucosa  moist Cardiovascular: No JVD. S1-S2 present, rhythmic, no gallops, rubs, or murmurs. No lower extremity edema. Pulmonary: positive breath sounds bilaterally, adequate air movement, no wheezing, rhonchi or rales. Gastrointestinal. Abdomen soft but tender to palpation at the lowee abdomen, no guarding or rigidity. Skin. No rashes Musculoskeletal: no joint deformities     Data Reviewed: I have personally reviewed following labs and imaging studies  CBC: Recent Labs  Lab 03/25/20 1420 03/25/20 1446 03/26/20 0446 03/27/20 0341  WBC 6.8  --  8.0 8.5  NEUTROABS 5.3  --   --   --   HGB 16.1 17.7* 12.6* 12.2*  HCT 48.6 52.0 38.8* 36.5*  MCV 86.9  --  87.6 86.1  PLT 243  --  192 210   Basic Metabolic Panel: Recent Labs  Lab 03/25/20 1420 03/25/20 1446 03/25/20 1603 03/25/20 2315 03/26/20 0135 03/26/20 0446  NA 128* 130* 130* 130* 131* 132*  K 4.0 4.1 3.9 4.0 3.9 3.7  CL 86* 89* 92* 96* 98 97*  CO2 28  --  28 27 26 27   GLUCOSE 539* 551* 365* 241* 121* 115*  BUN 23 23 21 20 18 18   CREATININE 1.23 1.10 1.00 0.95 0.93 0.86  CALCIUM 9.2  --  8.6* 8.2* 8.2*  8.3*  MG  --   --  2.1  --   --   --   PHOS  --   --  2.8  --   --   --    GFR: Estimated Creatinine Clearance: 66.4 mL/min (by C-G formula based on SCr of 0.86 mg/dL). Liver Function Tests: Recent Labs  Lab 03/25/20 1420 03/26/20 0446  AST 36 27  ALT 27 20  ALKPHOS 82 54  BILITOT 0.8 0.6  PROT 8.1 5.9*  ALBUMIN 3.8 2.9*   Recent Labs  Lab 03/25/20 1420  LIPASE 26   No results for input(s): AMMONIA in the last 168 hours. Coagulation Profile: No results for input(s): INR, PROTIME in the last 168 hours. Cardiac Enzymes: No results for input(s): CKTOTAL, CKMB, CKMBINDEX, TROPONINI in the last 168 hours. BNP (last 3 results) No results for input(s): PROBNP in the last 8760 hours. HbA1C: Recent Labs    03/26/20 0446  HGBA1C 10.6*   CBG: Recent Labs  Lab 03/27/20 0411 03/27/20 0427 03/27/20 0554  03/27/20 0739 03/27/20 1143  GLUCAP 43* 72 121* 101* 273*   Lipid Profile: Recent Labs    03/26/20 0446  CHOL 205*  HDL 57  LDLCALC 137*  TRIG 53  CHOLHDL 3.6   Thyroid Function Tests: No results for input(s): TSH, T4TOTAL, FREET4, T3FREE, THYROIDAB in the last 72 hours. Anemia Panel: No results for input(s): VITAMINB12, FOLATE, FERRITIN, TIBC, IRON, RETICCTPCT in the last 72 hours.    Radiology Studies: I have reviewed all of the imaging during this hospital visit personally     Scheduled Meds: . amitriptyline  20 mg Oral QHS  . aspirin EC  81 mg Oral Daily  . atorvastatin  40 mg Oral Daily  . feeding supplement  237 mL Oral TID BM  . folic acid  1 mg Oral Daily  . gabapentin  1,200 mg Oral QHS  . insulin aspart  0-5 Units Subcutaneous QHS  . insulin aspart  0-9 Units Subcutaneous TID WC  . insulin glargine  5 Units Subcutaneous QHS  . losartan  25 mg Oral Daily  . melatonin  12 mg Oral QHS  . multivitamin with minerals  1 tablet Oral Daily  . nicotine  21 mg Transdermal Daily  . thiamine  100 mg Oral Daily  . white petrolatum       Continuous Infusions: . famotidine (PEPCID) IV 20 mg (03/27/20 0744)  . lactated ringers 100 mL/hr at 03/27/20 1440     LOS: 2 days        Mauricio Annett Gula, MD

## 2020-03-27 NOTE — Progress Notes (Signed)
Initial Nutrition Assessment  DOCUMENTATION CODES:   Severe malnutrition in context of social or environmental circumstances  INTERVENTION:  Recommend liberalizing diet to regular to encourage PO intake  Obtain new weight  Ensure Enlive po TID, each supplement provides 350 kcal and 20 grams of protein  MVI with minerals daily   NUTRITION DIAGNOSIS:   Severe Malnutrition related to social / environmental circumstances as evidenced by severe fat depletion,severe muscle depletion.    GOAL:   Patient will meet greater than or equal to 90% of their needs    MONITOR:   PO intake,Supplement acceptance,Weight trends,Labs,I & O's  REASON FOR ASSESSMENT:   Malnutrition Screening Tool    ASSESSMENT:   Pt admitted with NSTEMI and uncontrolled hyperglycemia with nonketotic hyperosmolar state PMH includes DM, HTN, HLD, depression, polysubstance abuse.  Pt states appetite is "fine" and has been since PTA, denies any changes to appetite. Interview ended abruptly as pt had been on hold with [employer] to receive member benefits card and had finally been connected to a representative. Will obtain more detailed history at follow-up. Per H&P, pt was c/o abdominal pain and vomiting for several weeks PTA.    Attempted to review weight history, but no new weight obtained for this admission.   No PO intake documented.   UOP: x 24 hours  Medications: folvite, ss novolog TID w/ meals and at bedtime, 5 units lantus, thiamine, IV pepcid Labs: Na 132 (L) CBGs 121-101-273 (Diabetes Coordinator following)   NUTRITION - FOCUSED PHYSICAL EXAM:  Flowsheet Row Most Recent Value  Orbital Region Severe depletion  Upper Arm Region Severe depletion  Thoracic and Lumbar Region Severe depletion  Buccal Region Severe depletion  Temple Region Severe depletion  Clavicle Bone Region Severe depletion  Clavicle and Acromion Bone Region Severe depletion  Scapular Bone Region Severe depletion  Dorsal  Hand Severe depletion  Patellar Region Severe depletion  Anterior Thigh Region Severe depletion  Posterior Calf Region Severe depletion  Edema (RD Assessment) None  Hair Reviewed  Eyes Reviewed  Mouth Reviewed  Skin Reviewed  Nails Reviewed       Diet Order:   Diet Order            Diet heart healthy/carb modified Room service appropriate? Yes; Fluid consistency: Thin  Diet effective now                 EDUCATION NEEDS:   No education needs have been identified at this time  Skin:  Skin Assessment: Reviewed RN Assessment  Last BM:  3/8  Height:   Ht Readings from Last 1 Encounters:  03/15/20 5\' 6"  (1.676 m)    Weight:   Wt Readings from Last 1 Encounters:  03/15/20 53.5 kg   BMI:  There is no height or weight on file to calculate BMI.  Estimated Nutritional Needs:   Kcal:  1900-2100  Protein:  95-105 grams  Fluid:  >1.9L/d    03/17/20, MS, RD, LDN RD pager number and weekend/on-call pager number located in Amion.

## 2020-03-27 NOTE — Progress Notes (Signed)
ANTICOAGULATION CONSULT NOTE  Pharmacy Consult for heparin Indication: chest pain/ACS and stroke   Patient Measurements: weight 53 kg, ht 66 inches   Heparin Dosing Weight: 53 kg  Vital Signs: Temp: 97.4 F (36.3 C) (03/11 0415) Temp Source: Oral (03/11 0415) BP: 163/98 (03/11 0415) Pulse Rate: 94 (03/11 0415)  Labs: Recent Labs    03/25/20 1420 03/25/20 1446 03/25/20 1603 03/25/20 1656 03/25/20 2315 03/26/20 0131 03/26/20 0135 03/26/20 0446 03/26/20 0941 03/26/20 1645 03/27/20 0341  HGB 16.1 17.7*  --   --   --   --   --  12.6*  --   --  12.2*  HCT 48.6 52.0  --   --   --   --   --  38.8*  --   --  36.5*  PLT 243  --   --   --   --   --   --  192  --   --  210  HEPARINUNFRC  --   --   --   --   --    < >  --   --  0.52 0.26* 0.52  CREATININE 1.23 1.10   < >  --  0.95  --  0.93 0.86  --   --   --   TROPONINIHS 656*  --   --  642*  --   --   --   --   --   --   --    < > = values in this interval not displayed.    Estimated Creatinine Clearance: 63.9 mL/min (by C-G formula based on SCr of 0.86 mg/dL).   Assessment: Patient is a 65 y.o M with renal infarcts (per abd CT on 03/15/20) and hx polysubstance abuse presented to the ED on 3/9 with c/o of abdominal pain. He was subsequently found to have elevated troponin and began having one sided weakness. Brain MRI on 3/9 showed "multifocal acute ischemia within both cerebellar hemispheres, the right occipital lobe and both frontal lobes."  Pharmacy consulted to dose IV heparin.  Heparin level this morning is slightly SUPRAtherapeutic of lower goal range for CVA (HL 0.52, goal of 0.3-0.5). No issues with the drip or bleeding noted per discussion with RN. Will reduce the drip slightly.   Goal of Therapy:  Heparin level 0.3-0.5 units/ml Monitor platelets by anticoagulation protocol: Yes   Plan:  - Reduce Heparin to 900 units/hr (9 ml/hr) - Will continue to monitor for any signs/symptoms of bleeding and will follow up with  heparin level in 6 hours   Thank you for allowing pharmacy to be a part of this patient's care.  Georgina Pillion, PharmD, BCPS Clinical Pharmacist Clinical phone for 03/27/2020: [phone] 03/27/2020 7:58 AM   **Pharmacist phone directory can now be found on [email or web address] ([email or web address]).  Listed under [facility].

## 2020-03-27 NOTE — Progress Notes (Signed)
    [facility] has been requested to perform a transesophageal echocardiogram on William Acevedo for stroke.  After careful review of history and examination, the risks and benefits of transesophageal echocardiogram have been explained including risks of esophageal damage, perforation (1:10,000 risk), bleeding, pharyngeal hematoma as well as other potential complications associated with conscious sedation including aspiration, arrhythmia, respiratory failure and death. Alternatives to treatment were discussed, questions were answered. Patient is willing to proceed.   Georgie Chard, NP  03/27/2020 4:02 PM

## 2020-03-27 NOTE — Evaluation (Signed)
Physical Therapy Evaluation Patient Details Name: William Acevedo MRN: [id number] DOB: [birth year] Today's Date: 03/27/2020   History of Present Illness  65 yo male admitted to ED on 3/9 with 2-week history of abdominal pain, N/V, R weakness and incoordination. CT abdomen demonstrates possible renal infarct vs pyelonephritis. CT head shows chronic L occipital infarct, while MRI brain shows multifocal acute ischemia within cerebellar hemispheres, R occipital lobe, and bilat frontal lobes concerning for cardioembolic pattern. Pt with elevated troponin concerning for NSTEMI, cardiology following. PMH includes diabetes, current smoker, depression, neuropathy, PSA including cocaine use with history of overdose, encephalopathy.  Clinical Impression  Pt presents with mild impaired coordination, impaired standing balance, mildly ataxic gait, and decreased activity tolerance vs baseline. Pt to benefit from acute PT to address deficits. Pt ambulated 500 ft in hallway with use of close guard for safety but no AD. PT expects pt to continue to progress well. PT to progress mobility as tolerated, and will continue to follow acutely.      Follow Up Recommendations Outpatient PT;Supervision for mobility/OOB    Equipment Recommendations  None recommended by PT    Recommendations for Other Services       Precautions / Restrictions Precautions Precautions: Fall Restrictions Weight Bearing Restrictions: No      Mobility  Bed Mobility Overal bed mobility: Modified Independent                  Transfers Overall transfer level: Needs assistance Equipment used: None Transfers: Sit to/from Stand Sit to Stand: Min guard         General transfer comment: for safety  Ambulation/Gait Ambulation/Gait assistance: Min guard Gait Distance (Feet): 500 Feet Assistive device: None Gait Pattern/deviations: Step-through pattern;Decreased stride length;Narrow base of support;Wide base of  support;Drifts right/left;Ataxic Gait velocity: decr   General Gait Details: mildly ataxic gait with varying step width, initially with truncal hyperextension and widened BOS with narrowing BOS with fatigue. close guard for safety  Stairs            Wheelchair Mobility    Modified Rankin (Stroke Patients Only) Modified Rankin (Stroke Patients Only) Pre-Morbid Rankin Score: No symptoms Modified Rankin: Moderate disability     Balance Overall balance assessment: Needs assistance;History of Falls Sitting-balance support: No upper extremity supported Sitting balance-Leahy Scale: Fair     Standing balance support: No upper extremity supported;During functional activity Standing balance-Leahy Scale: Fair               High level balance activites: Head turns;Turns;Sudden stops;Direction changes;Backward walking High Level Balance Comments: WFL: step over object, weaving, 180* turn. Impaired (weaving of gait, change in speed, near scissoring): horiz/vert head turns, backward walking             Pertinent Vitals/Pain Pain Assessment: Faces Faces Pain Scale: Hurts even more Pain Location: abdomen Pain Descriptors / Indicators: Sore Pain Intervention(s): Limited activity within patient's tolerance;Monitored during session;Repositioned    Home Living Family/patient expects to be discharged to:: Private residence Living Arrangements: Other relatives (brother) Available Help at Discharge: Family Type of Home: House Home Access: Ramped entrance     Home Layout: One level Home Equipment: Bedside commode;Shower seat;Cane - single point;Crutches;Wheelchair - manual      Prior Function Level of Independence: Independent         Comments: works various odd jobs, states "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 me on a regular basis"     Hand Dominance   Dominant Hand: Right  Extremity/Trunk Assessment   Upper Extremity Assessment Upper Extremity Assessment: Defer  to OT evaluation    Lower Extremity Assessment Lower Extremity Assessment: Generalized weakness;RLE deficits/detail;LLE deficits/detail (WFL heel to shin) RLE Coordination: decreased gross motor LLE Coordination: decreased gross motor    Cervical / Trunk Assessment Cervical / Trunk Assessment: Normal  Communication   Communication: No difficulties  Cognition Arousal/Alertness: Awake/alert Behavior During Therapy: WFL for tasks assessed/performed Overall Cognitive Status: Within Functional Limits for tasks assessed                                 General Comments: hyperverbose      General Comments General comments (skin integrity, edema, etc.): vss    Exercises     Assessment/Plan    PT Assessment Patient needs continued PT services  PT Problem List Decreased strength;Decreased mobility;Decreased coordination;Decreased activity tolerance;Decreased balance;Decreased knowledge of use of DME;Pain;Decreased safety awareness       PT Treatment Interventions DME instruction;Therapeutic activities;Gait training;Therapeutic exercise;Patient/family education;Balance training;Stair training;Neuromuscular re-education;Functional mobility training    PT Goals (Current goals can be found in the Care Plan section)  Acute Rehab PT Goals Patient Stated Goal: get better PT Goal Formulation: With patient Time For Goal Achievement: 04/10/20 Potential to Achieve Goals: Good    Frequency Min 4X/week   Barriers to discharge        Co-evaluation               AM-PAC PT "6 Clicks" Mobility  Outcome Measure Help needed turning from your back to your side while in a flat bed without using bedrails?: None Help needed moving from lying on your back to sitting on the side of a flat bed without using bedrails?: None Help needed moving to and from a bed to a chair (including a wheelchair)?: None Help needed standing up from a chair using your arms (e.g., wheelchair or  bedside chair)?: None Help needed to walk in hospital room?: A Little Help needed climbing 3-5 steps with a railing? : A Little 6 Click Score: 22    End of Session Equipment Utilized During Treatment: Gait belt Activity Tolerance: Patient tolerated treatment well;Patient limited by fatigue Patient left: in bed;with call bell/phone within reach;with bed alarm set Nurse Communication: Mobility status PT Visit Diagnosis: Other abnormalities of gait and mobility (R26.89);Difficulty in walking, not elsewhere classified (R26.2)    Time: 6962-9528 PT Time Calculation (min) (ACUTE ONLY): 32 min   Charges:   PT Evaluation $PT Eval Low Complexity: 1 Low PT Treatments $Gait Training: 8-22 mins       Marye Round, PT Acute Rehabilitation Services Pager [phone]  Office [phone]  William Acevedo 03/27/2020, 5:15 PM

## 2020-03-28 DIAGNOSIS — F1910 Other psychoactive substance abuse, uncomplicated: Secondary | ICD-10-CM | POA: Diagnosis not present

## 2020-03-28 DIAGNOSIS — F330 Major depressive disorder, recurrent, mild: Secondary | ICD-10-CM | POA: Diagnosis not present

## 2020-03-28 DIAGNOSIS — E43 Unspecified severe protein-calorie malnutrition: Secondary | ICD-10-CM | POA: Diagnosis not present

## 2020-03-28 DIAGNOSIS — E1159 Type 2 diabetes mellitus with other circulatory complications: Secondary | ICD-10-CM | POA: Diagnosis not present

## 2020-03-28 DIAGNOSIS — I6330 Cerebral infarction due to thrombosis of unspecified cerebral artery: Secondary | ICD-10-CM | POA: Diagnosis not present

## 2020-03-28 LAB — BASIC METABOLIC PANEL
Anion gap: 4 — ABNORMAL LOW (ref 5–15)
BUN: 21 mg/dL (ref 8–23)
CO2: 33 mmol/L — ABNORMAL HIGH (ref 22–32)
Calcium: 8.7 mg/dL — ABNORMAL LOW (ref 8.9–10.3)
Chloride: 97 mmol/L — ABNORMAL LOW (ref 98–111)
Creatinine, Ser: 1.31 mg/dL — ABNORMAL HIGH (ref 0.61–1.24)
GFR, Estimated: >60 mL/min (ref >60)
Glucose, Bld: 335 mg/dL — ABNORMAL HIGH (ref 70–99)
Potassium: 5.4 mmol/L — ABNORMAL HIGH (ref 3.5–5.1)
Sodium: 134 mmol/L — ABNORMAL LOW (ref 135–145)

## 2020-03-28 LAB — GLUCOSE, CAPILLARY
Glucose-Capillary: 330 mg/dL — ABNORMAL HIGH (ref 70–99)
Glucose-Capillary: 322 mg/dL — ABNORMAL HIGH (ref 70–99)
Glucose-Capillary: 262 mg/dL — ABNORMAL HIGH (ref 70–99)
Glucose-Capillary: 236 mg/dL — ABNORMAL HIGH (ref 70–99)

## 2020-03-28 LAB — TROPONIN I (HIGH SENSITIVITY): Troponin I (High Sensitivity): 511 ng/L (ref <18)

## 2020-03-28 MED ORDER — POLYETHYLENE GLYCOL 3350 17 G PO PACK
17.0000 g | PACK | Freq: Two times a day (BID) | ORAL | Status: DC
  Administered 2020-03-28 – 2020-03-29 (×2): 17 g via ORAL
  Filled 2020-03-28 (×5): qty 1

## 2020-03-28 MED ORDER — INSULIN GLARGINE 100 UNIT/ML ~~LOC~~ SOLN
10.0000 [IU] | Freq: Every day | SUBCUTANEOUS | Status: DC
  Administered 2020-03-28: 10 [IU] via SUBCUTANEOUS
  Filled 2020-03-28 (×2): qty 0.1

## 2020-03-28 MED ORDER — FAMOTIDINE 20 MG PO TABS
20.0000 mg | ORAL_TABLET | Freq: Every day | ORAL | Status: DC
  Administered 2020-03-28 – 2020-03-30 (×3): 20 mg via ORAL
  Filled 2020-03-28 (×3): qty 1

## 2020-03-28 MED ORDER — SODIUM ZIRCONIUM CYCLOSILICATE 10 G PO PACK
10.0000 g | PACK | Freq: Once | ORAL | Status: AC
  Administered 2020-03-28: 10 g via ORAL
  Filled 2020-03-28: qty 1

## 2020-03-28 MED ORDER — BISACODYL 5 MG PO TBEC
10.0000 mg | DELAYED_RELEASE_TABLET | Freq: Once | ORAL | Status: AC
  Administered 2020-03-28: 10 mg via ORAL
  Filled 2020-03-28: qty 2

## 2020-03-28 NOTE — Progress Notes (Signed)
PROGRESS NOTE    ARRIE ZUERCHER  WUJ:[id number] DOB: Oct 09, 1955 DOA: 03/25/2020 PCP: Priscille Kluver, MD    Brief Narrative:  Mr. Goto was admitted to the hospital with working diagnosis of acute embolic stroke, in the setting of cocaine intoxication.   65 year old male past medical history for type 2 diabetes mellitus, hypertension and dyslipidemia.  He also has history of depressive disorder,and polysubstance abuse.  He developed abdominal pain, for several weeks, localized at the epigastrium. At home used cocaine to treat abdominal pain.  Reported loosing right hand grip, unable to handle small objects or tie his shoes.  Neurologic deficit acute onset about 5 hours prior to hospitalization. On his initial physical examination blood pressure 196/101, heart rate 103, respirate 16, oxygen saturation 98%. His lungs were clear to auscultation bilaterally, heart S1-S2, present rhythmic, soft abdomen, no lower extremity edema.  Sodium 128, potassium 4.0, chloride 86, bicarb 28, glucose 539, BUN 23, creatinine 1.23, troponin I 956-213.  White count 6.8, hemoglobin 16.1, hematocrit 40.6, platelets 243. SARS COVID-19 negative. Urinalysis negative for infection.  Toxicology screen positive for opiates, cocaine and tetrahydrocannabinol.  CT angiography no dissection.  Significant atherosclerosis.  Hypoperfusion of inferior right kidney, likely infarction. Head CT with small, stable chronic left occipital lobe infarct.  EKG 108 bpm, left axis deviation, normal intervals, sinus rhythm, Q-wave lead II, lead III and aVF, J-point elevation V3-V5, no significant T wave changes, positive LVH  Follow up workup with brain MRI with multifocal ischemia within both cerebellar hemispheres, right occipital lobe and both frontal lobes.   Patient ruled out for acute coronary syndrome.   Pending TEE and bubble study to complete neuro workup.   Assessment & Plan:   Principal Problem:    Cerebral thrombosis with cerebral infarction Active Problems:   MDD (major depressive disorder), recurrent episode, mild (HCC)   Polysubstance abuse (HCC)   DM2 (diabetes mellitus, type 2) (HCC)   Cocaine use   Secondary diabetes mellitus with HHNC (hyperglycemia hyperosmolar non-ketotic coma) (HCC)   Protein-calorie malnutrition, severe    1. Acute ischemic infarct, multifocal, both cerebellar hemispheres, right occipital lobe and frontal lobes.  Persistent paresthesias at his right arm.    On aspirin and atorvastatin with good toleration.   Echocardiogram transthoracic with no thrombus or vegetation. Plan for further work up with TEE on Monday with bubble study.   CTA head and neck with no emergent large vessel occlusion, greater than 50% stenosis of the proximal cavernous segment of left ICA, approximate 50% stenosis of the mid cavernous right ICA.   Follow with neurology recommendations. No OT follow up recommended.   2. Elevated troponin/ cocaine intoxication. Flat pattern troponin, EKG with no acute ischemic changes and echocardiogram with no wall motion abnormalities. Patient is chest pain free. Possible demand ischemia, not NSTEMI. Note patient positive for cocaine on admission.   Troponin is trending down consistent with demand ischemia. Advice to avoid cocaine, continue with aspirin and statin for medical therapy.   3. Abdominal pain/ constipation. Pain is reproducible to palpation at the lower abdomen. Imaging has been negative for dissection.  Possible gastritis or ischemic event from cocaine use.   Patient with no bowel movement for the last 7 days.   On famotidine, sucralfate and flexeril. Start bowel regimen with miralax and dulcolax.   3. HTN. Blood pressure control with losartan.   4. T2DM uncontrolled hyper and hypoglycemia. Fasting glucose up to 335 this am, will increase basal insulin to 10 units and  continue insulin sliding scale for glucose cover  and monitoring.   Neuropathy continue with gabapentin 1200 mg qhs.   5. Severe calorie protein malnutrition. On nutritional supplementation.  Continue with thiamine.   6. Depression/ tobacco abuse. On amitriptyline.  Continue with nicotine patch 21 mg.   7. Chronic pain syndrome. A needed hydrocodone .   8. AKI with hyperkalemia. Renal function with serum cr at 1,31 with K at 5,4 and bicarbonate at 33.  Patient is tolerating po well, continue to hold on IV fluids and follow renal panel in am.  Avoid hypotension or nephrotoxic medications, continue losartan for now.    Patient continue to be at high risk for worsening CVA and renal function   Status is: Inpatient  Remains inpatient appropriate because:Inpatient level of care appropriate due to severity of illness   Dispo: The patient is from: Home              Anticipated d/c is to: Home              Patient currently is not medically stable to d/c.   Difficult to place patient No    DVT prophylaxis: enoxaparin   Code Status:   full  Family Communication:  No family at the bedside      Nutrition Status: Nutrition Problem: Severe Malnutrition Etiology: social / environmental circumstances Signs/Symptoms: severe fat depletion,severe muscle depletion Interventions: Ensure Enlive (each supplement provides 350kcal and 20 grams of protein),MVI,Liberalize Diet     Consultants:   Neurology   Cardiology for TEE/ bubble study   Subjective: Patient continue to have mild paresthesias on his right arm, no nausea or vomiting, no chest pain or dyspnea.  Persistent abdominal pain, lower abdomen, no bowel movement for the last 7 days,   Objective: Vitals:   03/27/20 2316 03/28/20 0319 03/28/20 0733 03/28/20 1120  BP: (!) 156/86 (!) 150/81 (!) 152/78 (!) 154/81  Pulse:  87 94 87  Resp: 15 19 18 18   Temp: 98.3 F (36.8 C) 98.1 F (36.7 C) 97.7 F (36.5 C) 97.8 F (36.6 C)  TempSrc: Oral Oral Oral Oral  SpO2: 100%  100% 99% 100%  Weight:      Height:        Intake/Output Summary (Last 24 hours) at 03/28/2020 1120 Last data filed at 03/28/2020 05/28/2020 Gross per 24 hour  Intake 720 ml  Output 1500 ml  Net -780 ml   Filed Weights   03/27/20 1459  Weight: 55.6 kg    Examination:   General: Not in pain or dyspnea, deconditioned  Neurology: Awake and alert, non focal  E ENT: no pallor, no icterus, oral mucosa moist Cardiovascular: No JVD. S1-S2 present, rhythmic, no gallops, rubs, or murmurs. No lower extremity edema. Pulmonary: positive breath sounds bilaterally, adequate air movement, no wheezing, rhonchi or rales. Gastrointestinal. Abdomen tender to palpation at the lower abdomen, no guarding or rebound.  Skin. No rashes Musculoskeletal: no joint deformities     Data Reviewed: I have personally reviewed following labs and imaging studies  CBC: Recent Labs  Lab 03/25/20 1420 03/25/20 1446 03/26/20 0446 03/27/20 0341  WBC 6.8  --  8.0 8.5  NEUTROABS 5.3  --   --   --   HGB 16.1 17.7* 12.6* 12.2*  HCT 48.6 52.0 38.8* 36.5*  MCV 86.9  --  87.6 86.1  PLT 243  --  192 210   Basic Metabolic Panel: Recent Labs  Lab 03/25/20 1603 03/25/20 2315 03/26/20 0135 03/26/20  8177 03/27/20 1505 03/28/20 0244  NA 130* 130* 131* 132* 134* 134*  K 3.9 4.0 3.9 3.7 4.4 5.4*  CL 92* 96* 98 97* 97* 97*  CO2 28 27 26 27  32 33*  GLUCOSE 365* 241* 121* 115* 150* 335*  BUN 21 20 18 18 12 21   CREATININE 1.00 0.95 0.93 0.86 0.99 1.31*  CALCIUM 8.6* 8.2* 8.2* 8.3* 8.1* 8.7*  MG 2.1  --   --   --  1.9  --   PHOS 2.8  --   --   --  3.5  --    GFR: Estimated Creatinine Clearance: 43.6 mL/min (A) (by C-G formula based on SCr of 1.31 mg/dL (H)). Liver Function Tests: Recent Labs  Lab 03/25/20 1420 03/26/20 0446  AST 36 27  ALT 27 20  ALKPHOS 82 54  BILITOT 0.8 0.6  PROT 8.1 5.9*  ALBUMIN 3.8 2.9*   Recent Labs  Lab 03/25/20 1420  LIPASE 26   No results for input(s): AMMONIA in the last  168 hours. Coagulation Profile: No results for input(s): INR, PROTIME in the last 168 hours. Cardiac Enzymes: No results for input(s): CKTOTAL, CKMB, CKMBINDEX, TROPONINI in the last 168 hours. BNP (last 3 results) No results for input(s): PROBNP in the last 8760 hours. HbA1C: Recent Labs    03/26/20 0446  HGBA1C 10.6*   CBG: Recent Labs  Lab 03/27/20 0739 03/27/20 1143 03/27/20 1615 03/27/20 2109 03/28/20 0653  GLUCAP 101* 273* 200* 169* 330*   Lipid Profile: Recent Labs    03/26/20 0446  CHOL 205*  HDL 57  LDLCALC 137*  TRIG 53  CHOLHDL 3.6   Thyroid Function Tests: No results for input(s): TSH, T4TOTAL, FREET4, T3FREE, THYROIDAB in the last 72 hours. Anemia Panel: No results for input(s): VITAMINB12, FOLATE, FERRITIN, TIBC, IRON, RETICCTPCT in the last 72 hours.    Radiology Studies: I have reviewed all of the imaging during this hospital visit personally     Scheduled Meds: . amitriptyline  20 mg Oral QHS  . aspirin EC  81 mg Oral Daily  . atorvastatin  40 mg Oral Daily  . cyclobenzaprine  5 mg Oral TID  . famotidine  20 mg Oral Daily  . feeding supplement  237 mL Oral TID BM  . folic acid  1 mg Oral Daily  . gabapentin  1,200 mg Oral QHS  . insulin aspart  0-5 Units Subcutaneous QHS  . insulin aspart  0-9 Units Subcutaneous TID WC  . insulin glargine  5 Units Subcutaneous QHS  . losartan  25 mg Oral Daily  . melatonin  12 mg Oral QHS  . multivitamin with minerals  1 tablet Oral Daily  . nicotine  21 mg Transdermal Daily  . sucralfate  1 g Oral TID WC & HS  . thiamine  100 mg Oral Daily   Continuous Infusions:   LOS: 3 days        Dylanie Quesenberry 05/28/20, MD

## 2020-03-28 NOTE — Progress Notes (Signed)
PHARMACIST - PHYSICIAN COMMUNICATION  DR:   Ella Jubilee  CONCERNING: IV to Oral Route Change Policy  RECOMMENDATION: This patient is receiving famotidine by the intravenous route.  Based on criteria approved by the Pharmacy and Therapeutics Committee, the intravenous medication(s) is/are being converted to the equivalent oral dose form(s).   DESCRIPTION: These criteria include:  The patient is eating (either orally or via tube) and/or has been taking other orally administered medications for a least 24 hours  The patient has no evidence of active gastrointestinal bleeding or impaired GI absorption (gastrectomy, short bowel, patient on TNA or NPO).  If you have questions about this conversion, please contact the Pharmacy Department  []   [phone] )  ( [phone] []   [phone] )  [facility] [x]   [phone] )   [facility] []   [phone] )  [facility] []   [phone] )  [facility]   [place], [facility] 03/28/2020 7:26 AM

## 2020-03-28 NOTE — Progress Notes (Signed)
HOSPITAL MEDICINE OVERNIGHT EVENT NOTE    Notified by nursing that patient's troponin this morning is 511.  While elevated, this is continuing to downtrend from previous values of 656 and 642.  Chart reviewed, patient is felt to be suffering from possible demand ischemia secondary to recent cocaine use.  According to nursing patient is currently chest pain-free.  We will manage conservatively, continue to monitor on telemetry.  Marinda Elk  MD [facility]

## 2020-03-28 NOTE — Progress Notes (Signed)
STROKE TEAM PROGRESS NOTE   INTERVAL HISTORY Patient is up and around and walking in the room.  States is feeling better today.  Pain is much less.  He has no complaints. Vital signs stable.  No new complaints.  Vitals:   03/27/20 2316 03/28/20 0319 03/28/20 0733 03/28/20 1120  BP: (!) 156/86 (!) 150/81 (!) 152/78 (!) 154/81  Pulse:  87 94 87  Resp: 15 19 18 18   Temp: 98.3 F (36.8 C) 98.1 F (36.7 C) 97.7 F (36.5 C) 97.8 F (36.6 C)  TempSrc: Oral Oral Oral Oral  SpO2: 100% 100% 99% 100%  Weight:      Height:       CBC:  Recent Labs  Lab 03/25/20 1420 03/25/20 1446 03/26/20 0446 03/27/20 0341  WBC 6.8  --  8.0 8.5  NEUTROABS 5.3  --   --   --   HGB 16.1   < > 12.6* 12.2*  HCT 48.6   < > 38.8* 36.5*  MCV 86.9  --  87.6 86.1  PLT 243  --  192 210   < > = values in this interval not displayed.   Basic Metabolic Panel:  Recent Labs  Lab 03/25/20 1603 03/25/20 2315 03/27/20 1505 03/28/20 0244  NA 130*   < > 134* 134*  K 3.9   < > 4.4 5.4*  CL 92*   < > 97* 97*  CO2 28   < > 32 33*  GLUCOSE 365*   < > 150* 335*  BUN 21   < > 12 21  CREATININE 1.00   < > 0.99 1.31*  CALCIUM 8.6*   < > 8.1* 8.7*  MG 2.1  --  1.9  --   PHOS 2.8  --  3.5  --    < > = values in this interval not displayed.   Lipid Panel:  Recent Labs  Lab 03/26/20 0446  CHOL 205*  TRIG 53  HDL 57  CHOLHDL 3.6  VLDL 11  LDLCALC 161137*   HgbA1c:  Recent Labs  Lab 03/26/20 0446  HGBA1C 10.6*   Urine Drug Screen:  Recent Labs  Lab 03/25/20 1545  LABOPIA POSITIVE*  COCAINSCRNUR POSITIVE*  LABBENZ NONE DETECTED  AMPHETMU NONE DETECTED  THCU POSITIVE*  LABBARB NONE DETECTED    Alcohol Level No results for input(s): ETH in the last 168 hours.  IMAGING past 24 hours CT ANGIO HEAD W OR WO CONTRAST  Result Date: 03/27/2020 CLINICAL DATA:  Stroke follow-up EXAM: CT ANGIOGRAPHY HEAD AND NECK TECHNIQUE: Multidetector CT imaging of the head and neck was performed using the standard  protocol during bolus administration of intravenous contrast. Multiplanar CT image reconstructions and MIPs were obtained to evaluate the vascular anatomy. Carotid stenosis measurements (when applicable) are obtained utilizing NASCET criteria, using the distal internal carotid diameter as the denominator. CONTRAST:  75mL OMNIPAQUE IOHEXOL 350 MG/ML SOLN COMPARISON:  Brain MRI 03/25/2020 FINDINGS: CT HEAD FINDINGS Brain: There is no mass, hemorrhage or extra-axial collection. The size and configuration of the ventricles and extra-axial CSF spaces are normal. Old left occipital lobe infarct. Infarcts characterized on MRI are not clearly visible on CT. Skull: The visualized skull base, calvarium and extracranial soft tissues are normal. Sinuses/Orbits: No fluid levels or advanced mucosal thickening of the visualized paranasal sinuses. No mastoid or middle ear effusion. The orbits are normal. CTA NECK FINDINGS SKELETON: There is no bony spinal canal stenosis. No lytic or blastic lesion. OTHER NECK: Normal pharynx, larynx  and major salivary glands. No cervical lymphadenopathy. Unremarkable thyroid gland. UPPER CHEST: No pneumothorax or pleural effusion. No nodules or masses. AORTIC ARCH: There is calcific atherosclerosis of the aortic arch. There is no aneurysm, dissection or hemodynamically significant stenosis of the visualized portion of the aorta. Conventional 3 vessel aortic branching pattern. The visualized proximal subclavian arteries are widely patent. RIGHT CAROTID SYSTEM: No dissection, occlusion or aneurysm. There is mixed density atherosclerosis extending into the proximal ICA, resulting in less than 50% stenosis. LEFT CAROTID SYSTEM: No dissection, occlusion or aneurysm. There is mixed density atherosclerosis extending into the proximal ICA, resulting in less than 50% stenosis. VERTEBRAL ARTERIES: Left dominant configuration. Both origins are clearly patent. There is no dissection, occlusion or flow-limiting  stenosis to the skull base (V1-V3 segments). CTA HEAD FINDINGS POSTERIOR CIRCULATION: --Vertebral arteries: Normal V4 segments. --Inferior cerebellar arteries: Normal. --Basilar artery: Normal. --Superior cerebellar arteries: Normal. --Posterior cerebral arteries (PCA): Normal. ANTERIOR CIRCULATION: --Intracranial internal carotid arteries: Greater than 50% stenosis of the proximal cavernous segment of the left ICA. Approximately 50% stenosis of the mid cavernous right ICA. --Anterior cerebral arteries (ACA): Normal. Hypoplastic right A1 segment, normal variant. --Middle cerebral arteries (MCA): Normal. VENOUS SINUSES: As permitted by contrast timing, patent. ANATOMIC VARIANTS: None Review of the MIP images confirms the above findings. IMPRESSION: 1. No emergent large vessel occlusion. 2. Greater than 50% stenosis of the proximal cavernous segment of the left ICA. 3. Approximately 50% stenosis of the mid cavernous right ICA. Bilateral carotid bifurcation atherosclerosis without hemodynamically significant stenosis by NASCET criteria. Aortic Atherosclerosis (ICD10-I70.0). Electronically Signed   By: Deatra Robinson M.D.   On: 03/27/2020 19:23   CT ANGIO NECK W OR WO CONTRAST  Result Date: 03/27/2020 CLINICAL DATA:  Stroke follow-up EXAM: CT ANGIOGRAPHY HEAD AND NECK TECHNIQUE: Multidetector CT imaging of the head and neck was performed using the standard protocol during bolus administration of intravenous contrast. Multiplanar CT image reconstructions and MIPs were obtained to evaluate the vascular anatomy. Carotid stenosis measurements (when applicable) are obtained utilizing NASCET criteria, using the distal internal carotid diameter as the denominator. CONTRAST:  55mL OMNIPAQUE IOHEXOL 350 MG/ML SOLN COMPARISON:  Brain MRI 03/25/2020 FINDINGS: CT HEAD FINDINGS Brain: There is no mass, hemorrhage or extra-axial collection. The size and configuration of the ventricles and extra-axial CSF spaces are normal. Old  left occipital lobe infarct. Infarcts characterized on MRI are not clearly visible on CT. Skull: The visualized skull base, calvarium and extracranial soft tissues are normal. Sinuses/Orbits: No fluid levels or advanced mucosal thickening of the visualized paranasal sinuses. No mastoid or middle ear effusion. The orbits are normal. CTA NECK FINDINGS SKELETON: There is no bony spinal canal stenosis. No lytic or blastic lesion. OTHER NECK: Normal pharynx, larynx and major salivary glands. No cervical lymphadenopathy. Unremarkable thyroid gland. UPPER CHEST: No pneumothorax or pleural effusion. No nodules or masses. AORTIC ARCH: There is calcific atherosclerosis of the aortic arch. There is no aneurysm, dissection or hemodynamically significant stenosis of the visualized portion of the aorta. Conventional 3 vessel aortic branching pattern. The visualized proximal subclavian arteries are widely patent. RIGHT CAROTID SYSTEM: No dissection, occlusion or aneurysm. There is mixed density atherosclerosis extending into the proximal ICA, resulting in less than 50% stenosis. LEFT CAROTID SYSTEM: No dissection, occlusion or aneurysm. There is mixed density atherosclerosis extending into the proximal ICA, resulting in less than 50% stenosis. VERTEBRAL ARTERIES: Left dominant configuration. Both origins are clearly patent. There is no dissection, occlusion or flow-limiting stenosis to the  skull base (V1-V3 segments). CTA HEAD FINDINGS POSTERIOR CIRCULATION: --Vertebral arteries: Normal V4 segments. --Inferior cerebellar arteries: Normal. --Basilar artery: Normal. --Superior cerebellar arteries: Normal. --Posterior cerebral arteries (PCA): Normal. ANTERIOR CIRCULATION: --Intracranial internal carotid arteries: Greater than 50% stenosis of the proximal cavernous segment of the left ICA. Approximately 50% stenosis of the mid cavernous right ICA. --Anterior cerebral arteries (ACA): Normal. Hypoplastic right A1 segment, normal  variant. --Middle cerebral arteries (MCA): Normal. VENOUS SINUSES: As permitted by contrast timing, patent. ANATOMIC VARIANTS: None Review of the MIP images confirms the above findings. IMPRESSION: 1. No emergent large vessel occlusion. 2. Greater than 50% stenosis of the proximal cavernous segment of the left ICA. 3. Approximately 50% stenosis of the mid cavernous right ICA. Bilateral carotid bifurcation atherosclerosis without hemodynamically significant stenosis by NASCET criteria. Aortic Atherosclerosis (ICD10-I70.0). Electronically Signed   By: Deatra Robinson M.D.   On: 03/27/2020 19:23    Prior CT abdomen pelvis February 2022 concerning for some degree of right kidney lower pole infarction.-Versus pyelonephritis.  PHYSICAL EXAM  Temp:  [97.7 F (36.5 C)-98.4 F (36.9 C)] 97.8 F (36.6 C) (03/12 1120) Pulse Rate:  [87-94] 87 (03/12 1120) Resp:  [14-19] 18 (03/12 1120) BP: (144-156)/(78-89) 154/81 (03/12 1120) SpO2:  [99 %-100 %] 100 % (03/12 1120) Weight:  [55.6 kg] 55.6 kg (03/11 1459)  General - Thin, well developed middle-aged African-American male Cardiovascular - Regular rhythm and rate showing on tele  Mental Status -  Alert, oriented x4.   Language including expression, naming, repetition, comprehension was assessed and found intact.  Attention span and concentration were normal.  Recent and remote memory were intact.   Cranial Nerves II - XII - II - Visual field intact OU. III, IV, VI - Extraocular movements intact.  V - Facial sensation impaired d/t reported old injury with numbness from left forehead to left mid cheek.  VII - Facial movement intact bilaterally.  VIII - Hearing intact to voice.  X - Palate elevates symmetrically.  XI - Chin turning & shoulder shrug intact bilaterally.  XII - Tongue protrusion intact.   Motor Strength - Mild weakness in R grip 4+, otherwise 5/5 RUE. RLE is full strength. L hemibody is full strength. No pronator drift.  Motor Tone -  Muscle tone was normal. Sensory - Entire right hand with decreased sensation from wrist to fingertips.  Coordination -  FTN and HTS intact  Gait steady without any ataxia  Assessment and plan   Stroke: Multifocal small or punctate acute and subacute infarcts involving right cerebellum, right ACA and MCA/PCA and b/l MCA/ACA regions, no hemorrhagic conversion. Definitely cardioembolic pattern. Along with his renal infarct, rule out LV thrombus, endocarditis, or RV thrombus with PFO in the setting of cocaine vasculopathy..    CT head code Stroke :Findings suggestive of a small, stable chronic left occipital lobe infarct. MRI correlation is recommended.  CTA  head & neck shows no large vessel stenosis or occlusion.  Greater than 50% stenosis of proximal cavernous segment of left ICA and 50% stenosis of the mid cavernous right ICA.  Brain MRI done showed multifocal small or punctate acute and subacute infarcts involving right cerebellum, right ACA and MCA/PCA and b/l MCA/ACA regions, no hemorrhagic conversion.  2D Echo: EF 65-70% No thrombus, wall motion abnormality or shunt found. +mild LVH  VTE prophylaxis - on heparin gtt    Diet   Diet Carb Modified Fluid consistency: Thin; Room service appropriate? Yes    Given the small size of  stroke and subacute appearance, OK with heparin IV and ASA 81mg    No AC/AP prior to admission  Would recommend TEE   Obtain blood c/s  Therapy recommendations:  TBD  Disposition:  TBD  Hypertension Renal artery stenosis . No need for permissive hypertension as symptoms are at least 36 days old, maybe longer  . Long-term BP goal normotensive  Hyperlipidemia  Home meds:  Lipitor 20mg    LDL 137, goal < 70  High intensity statin: Increase Lipitor dose to 40mg    Continue statin at discharge  Diabetes type II Uncontrolled with HHNC  Management per primary team  HgbA1c 10.6, goal < 7.0  CBGs Recent Labs    03/27/20 2109 03/28/20 0653  03/28/20 1221  GLUCAP 169* 330* 322*      SSI  Close PCP  follow up for continued management  NSTEMI:   Management per cardiology/primary team  On heparin gtt  EKG shows right axis deviation.   On ASA 81mg   Elevated troponins  Hyponatremia Na 130->130_>131->132 -May be chronic in settting of ETOH abuse. ED/hospital v visits on file for the past two years show mulitple low Na values (127-134)  Other Stroke Risk Factors  Advanced Age >/= 74   Cigarette, crack cocaine and marijuana smoker.   ETOH abuse:chronic drink every day 6 or more beers  Substance abuse - UDS:  THC POSITIVE, Cocaine POSITIVE. Patient advised to stop using due to stroke risk.  ?coronary artery disease   Other Active Problems  Polysubstance Abuse -Currently denying withdrawal s/s -Consider [facility] day # 3  Patient is doing well and appears neurologically stable. TEE scheduled for Monday  We will follow after the TEE results become available.  Discussed with Dr. .  -- 75, MD Stroke Neurology Pager: [phone]

## 2020-03-28 NOTE — Progress Notes (Signed)
Patient troponin level 511 this morning consistent with previous results.Patient denies chest pain this morning.Will text paged MD on call.

## 2020-03-28 NOTE — Progress Notes (Signed)
K+ came back 5.4. Ok to give Lokelma 10g PO x1 per Dr. Ella Jubilee.  Ulyses Southward, PharmD, BCIDP, AAHIVP, CPP Infectious Disease Pharmacist 03/28/2020 1:36 PM

## 2020-03-28 NOTE — Evaluation (Signed)
Occupational Therapy Evaluation Patient Details Name: William Acevedo MRN: [id number] DOB: October 23, 1955 Today's Date: 03/28/2020    History of Present Illness 65 yo male admitted to ED on 3/9 with 2-week history of abdominal pain, N/V, R weakness and incoordination. CT abdomen demonstrates possible renal infarct vs pyelonephritis. CT head shows chronic L occipital infarct, while MRI brain shows multifocal acute ischemia within cerebellar hemispheres, R occipital lobe, and bilat frontal lobes concerning for cardioembolic pattern. Pt with elevated troponin concerning for NSTEMI, cardiology following. PMH includes diabetes, current smoker, depression, neuropathy, PSA including cocaine use with history of overdose, encephalopathy.   Clinical Impression   Patient evaluated by Occupational Therapy with no further acute OT needs identified. All education has been completed and the patient has no further questions. See below for any follow-up Occupational Therapy or equipment needs. OT to sign off. Thank you for referral.      Follow Up Recommendations  No OT follow up    Equipment Recommendations  None recommended by OT    Recommendations for Other Services       Precautions / Restrictions Precautions Precautions: None      Mobility Bed Mobility Overal bed mobility: Modified Independent                  Transfers Overall transfer level: Modified independent                    Balance                                           ADL either performed or assessed with clinical judgement   ADL Overall ADL's : Modified independent                                       General ADL Comments: noted to have facial droop, completed 3 vision work sheets without deficits. pt reports baseline blurry vision     Vision Baseline Vision/History: Wears glasses Wears Glasses: At all times Vision Assessment?: Yes Eye Alignment: Within  Functional Limits Ocular Range of Motion: Within Functional Limits Alignment/Gaze Preference: Within Defined Limits Tracking/Visual Pursuits: Able to track stimulus in all quads without difficulty     Perception     Praxis      Pertinent Vitals/Pain Pain Assessment: No/denies pain     Hand Dominance Right   Extremity/Trunk Assessment Upper Extremity Assessment Upper Extremity Assessment: Overall WFL for tasks assessed   Lower Extremity Assessment Lower Extremity Assessment: Overall WFL for tasks assessed   Cervical / Trunk Assessment Cervical / Trunk Assessment: Normal   Communication Communication Communication: No difficulties   Cognition Arousal/Alertness: Awake/alert Behavior During Therapy: WFL for tasks assessed/performed Overall Cognitive Status: Within Functional Limits for tasks assessed                                 General Comments: reports "[place]"   General Comments  VSS- able to static stand and complete dynamic balance with shaving    Exercises     Shoulder Instructions      Home Living Family/patient expects to be discharged to:: Private residence Living Arrangements: Other relatives Available Help at Discharge: Family Type of Home:  House Home Access: Ramped entrance     Home Layout: One level     Bathroom Shower/Tub: Chief Strategy Officer: Standard Bathroom Accessibility: Yes   Home Equipment: Bedside commode;Shower seat;Cane - single point;Crutches;Wheelchair - manual   Additional Comments: wearing brothers readers reports his glasses are in the mail      Prior Functioning/Environment Level of Independence: Independent        Comments: works various odd jobs, states "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 me on a regular basis"        OT Problem List:        OT Treatment/Interventions:      OT Goals(Current goals can be found in the care plan section) Acute Rehab OT Goals Patient  Stated Goal: to go home and get my face back  OT Frequency:     Barriers to D/C:            Co-evaluation              AM-PAC OT "6 Clicks" Daily Activity     Outcome Measure Help from another person eating meals?: None Help from another person taking care of personal grooming?: None Help from another person toileting, which includes using toliet, bedpan, or urinal?: None Help from another person bathing (including washing, rinsing, drying)?: None Help from another person to put on and taking off regular upper body clothing?: None Help from another person to put on and taking off regular lower body clothing?: None 6 Click Score: 24   End of Session Nurse Communication: Mobility status  Activity Tolerance: Patient tolerated treatment well Patient left: in bed;with bed alarm set;with call bell/phone within reach  OT Visit Diagnosis: Low vision, both eyes (H54.2)                Time: 7124-5809 OT Time Calculation (min): 24 min Charges:  OT General Charges $OT Visit: 1 Visit OT Evaluation $OT Eval Low Complexity: 1 Low   Brynn, OTR/L  Acute Rehabilitation Services Pager: [phone] Office: [phone] .   Mateo Flow 03/28/2020, 11:18 AM

## 2020-03-29 DIAGNOSIS — E1159 Type 2 diabetes mellitus with other circulatory complications: Secondary | ICD-10-CM | POA: Diagnosis not present

## 2020-03-29 DIAGNOSIS — F1490 Cocaine use, unspecified, uncomplicated: Secondary | ICD-10-CM | POA: Diagnosis not present

## 2020-03-29 DIAGNOSIS — F1910 Other psychoactive substance abuse, uncomplicated: Secondary | ICD-10-CM | POA: Diagnosis not present

## 2020-03-29 DIAGNOSIS — F330 Major depressive disorder, recurrent, mild: Secondary | ICD-10-CM | POA: Diagnosis not present

## 2020-03-29 DIAGNOSIS — I6330 Cerebral infarction due to thrombosis of unspecified cerebral artery: Secondary | ICD-10-CM | POA: Diagnosis not present

## 2020-03-29 LAB — GLUCOSE, CAPILLARY
Glucose-Capillary: 333 mg/dL — ABNORMAL HIGH (ref 70–99)
Glucose-Capillary: 396 mg/dL — ABNORMAL HIGH (ref 70–99)
Glucose-Capillary: 376 mg/dL — ABNORMAL HIGH (ref 70–99)
Glucose-Capillary: 258 mg/dL — ABNORMAL HIGH (ref 70–99)

## 2020-03-29 LAB — BASIC METABOLIC PANEL
Anion gap: 3 — ABNORMAL LOW (ref 5–15)
BUN: 21 mg/dL (ref 8–23)
CO2: 33 mmol/L — ABNORMAL HIGH (ref 22–32)
Calcium: 8.4 mg/dL — ABNORMAL LOW (ref 8.9–10.3)
Chloride: 94 mmol/L — ABNORMAL LOW (ref 98–111)
Creatinine, Ser: 1.2 mg/dL (ref 0.61–1.24)
GFR, Estimated: >60 mL/min (ref >60)
Glucose, Bld: 348 mg/dL — ABNORMAL HIGH (ref 70–99)
Potassium: 4.2 mmol/L (ref 3.5–5.1)
Sodium: 130 mmol/L — ABNORMAL LOW (ref 135–145)

## 2020-03-29 MED ORDER — BISACODYL 10 MG RE SUPP
10.0000 mg | Freq: Once | RECTAL | Status: AC
  Administered 2020-03-30: 10 mg via RECTAL
  Filled 2020-03-29: qty 1

## 2020-03-29 MED ORDER — BISACODYL 5 MG PO TBEC
10.0000 mg | DELAYED_RELEASE_TABLET | Freq: Once | ORAL | Status: AC
  Administered 2020-03-29: 10 mg via ORAL
  Filled 2020-03-29: qty 2

## 2020-03-29 MED ORDER — INSULIN GLARGINE 100 UNIT/ML ~~LOC~~ SOLN
20.0000 [IU] | Freq: Every day | SUBCUTANEOUS | Status: DC
  Administered 2020-03-29: 20 [IU] via SUBCUTANEOUS
  Filled 2020-03-29 (×2): qty 0.2

## 2020-03-29 NOTE — TOC CAGE-AID Note (Signed)
Transition of Care [facility]) - CAGE-AID Screening   Patient Details  Name: TRACE WIRICK MRN: [id number] Date of Birth: Sep 12, 1955  Transition of Care [facility]) CM/SW Contact:    Carley Hammed, LCSWA Phone Number: 03/29/2020, 10:24 AM   Clinical Narrative: CSW completed CAGE- AID assessment with pt via phone. Pt noted that he was using alcohol and cocaine prior to be admitted to the hospital. He stated that he had been to rehab before in [place], but is not currently receiving treatment. He noted interest in receiving resources which were provided by CSW.   CAGE-AID Screening:    Have You Ever Felt You Ought to Cut Down on Your Drinking or Drug Use?: Yes Have People Annoyed You By Critizing Your Drinking Or Drug Use?: Yes Have You Felt Bad Or Guilty About Your Drinking Or Drug Use?: Yes Have You Ever Had a Drink or Used Drugs First Thing In The Morning to Steady Your Nerves or to Get Rid of a Hangover?: No CAGE-AID Score: 3  Substance Abuse Education Offered: Yes  Substance abuse interventions: Patient Counseling,Educational Materials

## 2020-03-29 NOTE — Progress Notes (Signed)
STROKE TEAM PROGRESS NOTE   INTERVAL HISTORY Patient is lying in bed.  States he still has some abdominal pain today.  He does not  look uncomfortable..  He has no new complaints. Vital signs stable.  T schedule for tomorrow  Vitals:   03/28/20 1941 03/28/20 2317 03/29/20 0516 03/29/20 0742  BP: (!) 161/83 (!) 147/71 (!) 155/85 (!) 153/77  Pulse: 96 86 100 98  Resp: 18 18 18 18   Temp: 97.8 F (36.6 C) 98.2 F (36.8 C) 98.4 F (36.9 C) 98.2 F (36.8 C)  TempSrc: Oral Oral Oral Oral  SpO2: 100% 100% 97% 99%  Weight:      Height:       CBC:  Recent Labs  Lab 03/25/20 1420 03/25/20 1446 03/26/20 0446 03/27/20 0341  WBC 6.8  --  8.0 8.5  NEUTROABS 5.3  --   --   --   HGB 16.1   < > 12.6* 12.2*  HCT 48.6   < > 38.8* 36.5*  MCV 86.9  --  87.6 86.1  PLT 243  --  192 210   < > = values in this interval not displayed.   Basic Metabolic Panel:  Recent Labs  Lab 03/25/20 1603 03/25/20 2315 03/27/20 1505 03/28/20 0244 03/29/20 0333  NA 130*   < > 134* 134* 130*  K 3.9   < > 4.4 5.4* 4.2  CL 92*   < > 97* 97* 94*  CO2 28   < > 32 33* 33*  GLUCOSE 365*   < > 150* 335* 348*  BUN 21   < > 12 21 21   CREATININE 1.00   < > 0.99 1.31* 1.20  CALCIUM 8.6*   < > 8.1* 8.7* 8.4*  MG 2.1  --  1.9  --   --   PHOS 2.8  --  3.5  --   --    < > = values in this interval not displayed.   Lipid Panel:  Recent Labs  Lab 03/26/20 0446  CHOL 205*  TRIG 53  HDL 57  CHOLHDL 3.6  VLDL 11  LDLCALC *   HgbA1c:  Recent Labs  Lab 03/26/20 0446  HGBA1C 10.6*   Urine Drug Screen:  Recent Labs  Lab 03/25/20 1545  LABOPIA POSITIVE*  COCAINSCRNUR POSITIVE*  LABBENZ NONE DETECTED  AMPHETMU NONE DETECTED  THCU POSITIVE*  LABBARB NONE DETECTED    Alcohol Level No results for input(s): ETH in the last 168 hours.  IMAGING past 24 hours No results found.  Prior CT abdomen pelvis February 2022 concerning for some degree of right kidney lower pole infarction.-Versus  pyelonephritis.  PHYSICAL EXAM  Temp:  [97.8 F (36.6 C)-98.4 F (36.9 C)] 98.2 F (36.8 C) (03/13 0742) Pulse Rate:  [86-100] 98 (03/13 0742) Resp:  [18] 18 (03/13 0742) BP: (147-161)/(71-85) 153/77 (03/13 0742) SpO2:  [96 %-100 %] 99 % (03/13 0742)  General - Thin, well developed middle-aged African-American male Cardiovascular - Regular rhythm and rate showing on tele  Mental Status -  Alert, oriented x4.   Language including expression, naming, repetition, comprehension was assessed and found intact.  Attention span and concentration were normal.  Recent and remote memory were intact.   Cranial Nerves II - XII - II - Visual field intact OU. III, IV, VI - Extraocular movements intact.  V - Facial sensation impaired d/t reported old injury with numbness from left forehead to left mid cheek.  VII - Facial movement intact bilaterally.  VIII - Hearing intact to voice.  X - Palate elevates symmetrically.  XI - Chin turning & shoulder shrug intact bilaterally.  XII - Tongue protrusion intact.   Motor Strength - Mild weakness in R grip 4+, otherwise 5/5 RUE. RLE is full strength. L hemibody is full strength. No pronator drift.  Motor Tone - Muscle tone was normal. Sensory - Entire right hand with decreased sensation from wrist to fingertips.  Coordination -  FTN and HTS intact  Gait steady without any ataxia  Assessment and plan   Stroke: Multifocal small or punctate acute and subacute infarcts involving right cerebellum, right ACA and MCA/PCA and b/l MCA/ACA regions, no hemorrhagic conversion. Definitely cardioembolic pattern. Along with his renal infarct, rule out LV thrombus, endocarditis, or RV thrombus with PFO in the setting of cocaine vasculopathy..    CT head code Stroke :Findings suggestive of a small, stable chronic left occipital lobe infarct. MRI correlation is recommended.  CTA  head & neck shows no large vessel stenosis or occlusion.  Greater than 50%  stenosis of proximal cavernous segment of left ICA and 50% stenosis of the mid cavernous right ICA.  Brain MRI done showed multifocal small or punctate acute and subacute infarcts involving right cerebellum, right ACA and MCA/PCA and b/l MCA/ACA regions, no hemorrhagic conversion.  2D Echo: EF 65-70% No thrombus, wall motion abnormality or shunt found. +mild LVH  VTE prophylaxis - on heparin gtt    Diet   Diet Carb Modified Fluid consistency: Thin; Room service appropriate? Yes    Given the small size of stroke and subacute appearance, OK with heparin IV and ASA 81mg    No AC/AP prior to admission  Would recommend TEE   Obtain blood c/s  Therapy recommendations:  TBD  Disposition:  TBD  Hypertension Renal artery stenosis . No need for permissive hypertension as symptoms are at least 71 days old, maybe longer  . Long-term BP goal normotensive  Hyperlipidemia  Home meds:  Lipitor 20mg    LDL 137, goal < 70  High intensity statin: Increase Lipitor dose to 40mg    Continue statin at discharge  Diabetes type II Uncontrolled with HHNC  Management per primary team  HgbA1c 10.6, goal < 7.0  CBGs Recent Labs    03/28/20 1610 03/28/20 2200 03/29/20 0624  GLUCAP 262* 236* 333*      SSI  Close PCP  follow up for continued management  NSTEMI:   Management per cardiology/primary team  On heparin gtt  EKG shows right axis deviation.   On ASA 81mg   Elevated troponins  Hyponatremia Na 130->130_>131->132 -May be chronic in settting of ETOH abuse. ED/hospital v visits on file for the past two years show mulitple low Na values (127-134)  Other Stroke Risk Factors  Advanced Age >/= 71   Cigarette, crack cocaine and marijuana smoker.   ETOH abuse:chronic drink every day 6 or more beers  Substance abuse - UDS:  THC POSITIVE, Cocaine POSITIVE. Patient advised to stop using due to stroke risk.  ?coronary artery disease   Other Active  Problems  Polysubstance Abuse -Currently denying withdrawal s/s -Consider [facility] day # 4  Patient is doing well and appears neurologically stable. TEE scheduled for Monday  We will follow after the TEE results become available.     -- , MD Stroke Neurology Pager: [phone]

## 2020-03-29 NOTE — H&P (View-Only) (Signed)
PROGRESS NOTE    William Acevedo  ZOX:[id number] DOB: 05-25-1955 DOA: 03/25/2020 PCP: Priscille Kluver, MD    Brief Narrative:  William Acevedo was admitted to the hospital with working diagnosis of acute embolic stroke, in the setting of cocaine intoxication.  65 year old male past medical history for type 2 diabetes mellitus, hypertension and dyslipidemia. He also has history of depressive disorder,andpolysubstance abuse. He developed abdominal pain, for several weeks, localized at the epigastrium. At home used cocaine to treat abdominal pain. Reportedloosingright hand grip, unable to handle small objects or tie his shoes. Neurologic deficit acute onset about 5 hours prior to hospitalization. On his initial physical examination blood pressure 196/101, heart rate 103, respirate 16, oxygen saturation 98%. His lungs were clear to auscultation bilaterally, heart S1-S2, present rhythmic, soft abdomen, no lower extremity edema.  Sodium 128, potassium 4.0, chloride 86, bicarb 28, glucose 539, BUN 23, creatinine 1.23, troponin I 811-914.White count 6.8, hemoglobin 16.1, hematocrit 40.6, platelets 243. SARS COVID-19 negative. Urinalysis negative for infection.  Toxicology screen positive for opiates, cocaine and tetrahydrocannabinol.  CT angiography no dissection. Significant atherosclerosis. Hypoperfusion of inferior right kidney,likely infarction. Head CT with small, stable chronic left occipital lobe infarct.  EKG 108 bpm, left axis deviation, normal intervals, sinus rhythm, Q-wave lead II, lead III and aVF, J-point elevation V3-V5, no significant T wave changes, positive LVH  Follow up workup with brain MRI with multifocal ischemia within both cerebellar hemispheres, right occipital lobe and both frontal lobes.  Patient ruled out for acute coronary syndrome.   Pending TEE and bubble study to complete neuro workup.     Assessment & Plan:   Principal  Problem:   Cerebral thrombosis with cerebral infarction Active Problems:   MDD (major depressive disorder), recurrent episode, mild (HCC)   Polysubstance abuse (HCC)   DM2 (diabetes mellitus, type 2) (HCC)   Cocaine use   Secondary diabetes mellitus with HHNC (hyperglycemia hyperosmolar non-ketotic coma) (HCC)   Protein-calorie malnutrition, severe   1. Acute ischemic infarct, multifocal, both cerebellar hemispheres, right occipital lobe and frontal lobes.  Improving paresthesias, no focal weakness.   Continue with aspirin and atorvastatin with good toleration.   Echocardiogram transthoracic with no thrombus or vegetation. CTA head and neck with no emergent large vessel occlusion, greater than 50% stenosis of the proximal cavernous segment of left ICA, approximate 50% stenosis of the mid cavernous right ICA.   Pending TEE in am to complete neurologic work up. Plan to dc home after work up completed.   2. Elevated troponin/ cocaine intoxication. Flat patterntroponin, EKG with noacuteischemic changes and echocardiogram with no wall motion abnormalities. Patient is chest pain free. Possible demand ischemia, not NSTEMI. Note patient positive for cocaine on admission.   Continue with aspirin and statin. Cocaine cessation counseling  3. Abdominal pain/ constipation. Pain is reproducible to palpation at the lower abdomen. Imaging has been negative for dissection.  Possible gastritis or ischemic event from cocaine use.   Continue with no bowel movement.   Continue with  famotidine, sucralfate and flexeril. Bowel regimen with miralax bid and repeat dose of dulcolax.   3. HTN. Continue with losartan for blood pressure control. .   4. T2DMuncontrolled hyper and hypoglycemia.  Continue elevated glucose with fasting 348, capillary 333 and 396. On basal insulin 10 units and insulin sliding scale for glucose cover and monitoring.  Will increase dose to 20 units of glargine,  patient at home on 40 units, plus oral antihyperglycemic agents.   Neuropathy continue  with gabapentin 1200 mg qhs.   5. Severe calorie protein malnutrition. Continue with nutritional supplementation, including  thiamine.   6. Depression/ tobacco abuse. Continue with amitriptyline and nicotine patch 21 mg.   7. Chronic pain syndrome. continue to have abdominal pain, today has improved, continue as needed hydrocodone .  8. AKI with hyperkalemia. renal function with serum cr at 1,20 with K at ,2 and serum bicarbonate at 33. Pseudohyponatremia due to hyperglycemia.    Status is: Inpatient  Remains inpatient appropriate because:Inpatient level of care appropriate due to severity of illness   Dispo: The patient is from: Home              Anticipated d/c is to: Home              Patient currently is not medically stable to d/c.   Difficult to place patient No   DVT prophylaxis: Enoxaparin   Code Status:   full  Family Communication:  No family at the bedside      Nutrition Status: Nutrition Problem: Severe Malnutrition Etiology: social / environmental circumstances Signs/Symptoms: severe fat depletion,severe muscle depletion Interventions: Ensure Enlive (each supplement provides 350kcal and 20 grams of protein),MVI,Liberalize Diet    Consultants:   Neurology    Subjective: Patient continue with no bowel movement, no nausea or vomiting no chest pain or dyspnea, right upper extremity paresthesias are improving.   Objective: Vitals:   03/28/20 2317 03/29/20 0516 03/29/20 0742 03/29/20 1259  BP: (!) 147/71 (!) 155/85 (!) 153/77 114/72  Pulse: 86 100 98 94  Resp: 18 18 18 18   Temp: 98.2 F (36.8 C) 98.4 F (36.9 C) 98.2 F (36.8 C) 98 F (36.7 C)  TempSrc: Oral Oral Oral Oral  SpO2: 100% 97% 99% 98%  Weight:      Height:        Intake/Output Summary (Last 24 hours) at 03/29/2020 1359 Last data filed at 03/28/2020 2311 Gross per 24 hour  Intake 540 ml   Output --  Net 540 ml   Filed Weights   03/27/20 1459  Weight: 55.6 kg    Examination:   General: Not in pain or dyspnea, deconditioned  Neurology: Awake and alert, non focal  E ENT: mild pallor, no icterus, oral mucosa moist Cardiovascular: No JVD. S1-S2 present, rhythmic, no gallops, rubs, or murmurs. No lower extremity edema. Pulmonary: positive  breath sounds bilaterally, adequate air movement, no wheezing, rhonchi or rales. Gastrointestinal. Abdomen soft and non tender Skin. No rashes Musculoskeletal: no joint deformities     Data Reviewed: I have personally reviewed following labs and imaging studies  CBC: Recent Labs  Lab 03/25/20 1420 03/25/20 1446 03/26/20 0446 03/27/20 0341  WBC 6.8  --  8.0 8.5  NEUTROABS 5.3  --   --   --   HGB 16.1 17.7* 12.6* 12.2*  HCT 48.6 52.0 38.8* 36.5*  MCV 86.9  --  87.6 86.1  PLT 243  --  192 210   Basic Metabolic Panel: Recent Labs  Lab 03/25/20 1603 03/25/20 2315 03/26/20 0135 03/26/20 0446 03/27/20 1505 03/28/20 0244 03/29/20 0333  NA 130*   < > 131* 132* 134* 134* 130*  K 3.9   < > 3.9 3.7 4.4 5.4* 4.2  CL 92*   < > 98 97* 97* 97* 94*  CO2 28   < > 26 27 32 33* 33*  GLUCOSE 365*   < > 121* 115* 150* 335* 348*  BUN 21   < >  18 18 12 21 21   CREATININE 1.00   < > 0.93 0.86 0.99 1.31* 1.20  CALCIUM 8.6*   < > 8.2* 8.3* 8.1* 8.7* 8.4*  MG 2.1  --   --   --  1.9  --   --   PHOS 2.8  --   --   --  3.5  --   --    < > = values in this interval not displayed.   GFR: Estimated Creatinine Clearance: 47.6 mL/min (by C-G formula based on SCr of 1.2 mg/dL). Liver Function Tests: Recent Labs  Lab 03/25/20 1420 03/26/20 0446  AST 36 27  ALT 27 20  ALKPHOS 82 54  BILITOT 0.8 0.6  PROT 8.1 5.9*  ALBUMIN 3.8 2.9*   Recent Labs  Lab 03/25/20 1420  LIPASE 26   No results for input(s): AMMONIA in the last 168 hours. Coagulation Profile: No results for input(s): INR, PROTIME in the last 168 hours. Cardiac  Enzymes: No results for input(s): CKTOTAL, CKMB, CKMBINDEX, TROPONINI in the last 168 hours. BNP (last 3 results) No results for input(s): PROBNP in the last 8760 hours. HbA1C: No results for input(s): HGBA1C in the last 72 hours. CBG: Recent Labs  Lab 03/28/20 1221 03/28/20 1610 03/28/20 2200 03/29/20 0624 03/29/20 1259  GLUCAP 322* 262* 236* 333* 396*   Lipid Profile: No results for input(s): CHOL, HDL, LDLCALC, TRIG, CHOLHDL, LDLDIRECT in the last 72 hours. Thyroid Function Tests: No results for input(s): TSH, T4TOTAL, FREET4, T3FREE, THYROIDAB in the last 72 hours. Anemia Panel: No results for input(s): VITAMINB12, FOLATE, FERRITIN, TIBC, IRON, RETICCTPCT in the last 72 hours.    Radiology Studies: I have reviewed all of the imaging during this hospital visit personally     Scheduled Meds: . amitriptyline  20 mg Oral QHS  . aspirin EC  81 mg Oral Daily  . atorvastatin  40 mg Oral Daily  . cyclobenzaprine  5 mg Oral TID  . famotidine  20 mg Oral Daily  . feeding supplement  237 mL Oral TID BM  . folic acid  1 mg Oral Daily  . gabapentin  1,200 mg Oral QHS  . insulin aspart  0-5 Units Subcutaneous QHS  . insulin aspart  0-9 Units Subcutaneous TID WC  . insulin glargine  10 Units Subcutaneous QHS  . losartan  25 mg Oral Daily  . melatonin  12 mg Oral QHS  . multivitamin with minerals  1 tablet Oral Daily  . nicotine  21 mg Transdermal Daily  . polyethylene glycol  17 g Oral BID  . sucralfate  1 g Oral TID WC & HS  . thiamine  100 mg Oral Daily   Continuous Infusions:   LOS: 4 days        Khaniyah Bezek 03/31/20, MD

## 2020-03-29 NOTE — Progress Notes (Signed)
PROGRESS NOTE    William Acevedo  ZOX:[id number] DOB: 05-25-1955 DOA: 03/25/2020 PCP: Priscille Kluver, MD    Brief Narrative:  Mr. Cueva was admitted to the hospital with working diagnosis of acute embolic stroke, in the setting of cocaine intoxication.  65 year old male past medical history for type 2 diabetes mellitus, hypertension and dyslipidemia. He also has history of depressive disorder,andpolysubstance abuse. He developed abdominal pain, for several weeks, localized at the epigastrium. At home used cocaine to treat abdominal pain. Reportedloosingright hand grip, unable to handle small objects or tie his shoes. Neurologic deficit acute onset about 5 hours prior to hospitalization. On his initial physical examination blood pressure 196/101, heart rate 103, respirate 16, oxygen saturation 98%. His lungs were clear to auscultation bilaterally, heart S1-S2, present rhythmic, soft abdomen, no lower extremity edema.  Sodium 128, potassium 4.0, chloride 86, bicarb 28, glucose 539, BUN 23, creatinine 1.23, troponin I 811-914.White count 6.8, hemoglobin 16.1, hematocrit 40.6, platelets 243. SARS COVID-19 negative. Urinalysis negative for infection.  Toxicology screen positive for opiates, cocaine and tetrahydrocannabinol.  CT angiography no dissection. Significant atherosclerosis. Hypoperfusion of inferior right kidney,likely infarction. Head CT with small, stable chronic left occipital lobe infarct.  EKG 108 bpm, left axis deviation, normal intervals, sinus rhythm, Q-wave lead II, lead III and aVF, J-point elevation V3-V5, no significant T wave changes, positive LVH  Follow up workup with brain MRI with multifocal ischemia within both cerebellar hemispheres, right occipital lobe and both frontal lobes.  Patient ruled out for acute coronary syndrome.   Pending TEE and bubble study to complete neuro workup.     Assessment & Plan:   Principal  Problem:   Cerebral thrombosis with cerebral infarction Active Problems:   MDD (major depressive disorder), recurrent episode, mild (HCC)   Polysubstance abuse (HCC)   DM2 (diabetes mellitus, type 2) (HCC)   Cocaine use   Secondary diabetes mellitus with HHNC (hyperglycemia hyperosmolar non-ketotic coma) (HCC)   Protein-calorie malnutrition, severe   1. Acute ischemic infarct, multifocal, both cerebellar hemispheres, right occipital lobe and frontal lobes.  Improving paresthesias, no focal weakness.   Continue with aspirin and atorvastatin with good toleration.   Echocardiogram transthoracic with no thrombus or vegetation. CTA head and neck with no emergent large vessel occlusion, greater than 50% stenosis of the proximal cavernous segment of left ICA, approximate 50% stenosis of the mid cavernous right ICA.   Pending TEE in am to complete neurologic work up. Plan to dc home after work up completed.   2. Elevated troponin/ cocaine intoxication. Flat patterntroponin, EKG with noacuteischemic changes and echocardiogram with no wall motion abnormalities. Patient is chest pain free. Possible demand ischemia, not NSTEMI. Note patient positive for cocaine on admission.   Continue with aspirin and statin. Cocaine cessation counseling  3. Abdominal pain/ constipation. Pain is reproducible to palpation at the lower abdomen. Imaging has been negative for dissection.  Possible gastritis or ischemic event from cocaine use.   Continue with no bowel movement.   Continue with  famotidine, sucralfate and flexeril. Bowel regimen with miralax bid and repeat dose of dulcolax.   3. HTN. Continue with losartan for blood pressure control. .   4. T2DMuncontrolled hyper and hypoglycemia.  Continue elevated glucose with fasting 348, capillary 333 and 396. On basal insulin 10 units and insulin sliding scale for glucose cover and monitoring.  Will increase dose to 20 units of glargine,  patient at home on 40 units, plus oral antihyperglycemic agents.   Neuropathy continue  with gabapentin 1200 mg qhs.   5. Severe calorie protein malnutrition. Continue with nutritional supplementation, including  thiamine.   6. Depression/ tobacco abuse. Continue with amitriptyline and nicotine patch 21 mg.   7. Chronic pain syndrome. continue to have abdominal pain, today has improved, continue as needed hydrocodone .  8. AKI with hyperkalemia. renal function with serum cr at 1,20 with K at ,2 and serum bicarbonate at 33. Pseudohyponatremia due to hyperglycemia.    Status is: Inpatient  Remains inpatient appropriate because:Inpatient level of care appropriate due to severity of illness   Dispo: The patient is from: Home              Anticipated d/c is to: Home              Patient currently is not medically stable to d/c.   Difficult to place patient No   DVT prophylaxis: Enoxaparin   Code Status:   full  Family Communication:  No family at the bedside      Nutrition Status: Nutrition Problem: Severe Malnutrition Etiology: social / environmental circumstances Signs/Symptoms: severe fat depletion,severe muscle depletion Interventions: Ensure Enlive (each supplement provides 350kcal and 20 grams of protein),MVI,Liberalize Diet    Consultants:   Neurology    Subjective: Patient continue with no bowel movement, no nausea or vomiting no chest pain or dyspnea, right upper extremity paresthesias are improving.   Objective: Vitals:   03/28/20 2317 03/29/20 0516 03/29/20 0742 03/29/20 1259  BP: (!) 147/71 (!) 155/85 (!) 153/77 114/72  Pulse: 86 100 98 94  Resp: 18 18 18 18   Temp: 98.2 F (36.8 C) 98.4 F (36.9 C) 98.2 F (36.8 C) 98 F (36.7 C)  TempSrc: Oral Oral Oral Oral  SpO2: 100% 97% 99% 98%  Weight:      Height:        Intake/Output Summary (Last 24 hours) at 03/29/2020 1359 Last data filed at 03/28/2020 2311 Gross per 24 hour  Intake 540 ml   Output --  Net 540 ml   Filed Weights   03/27/20 1459  Weight: 55.6 kg    Examination:   General: Not in pain or dyspnea, deconditioned  Neurology: Awake and alert, non focal  E ENT: mild pallor, no icterus, oral mucosa moist Cardiovascular: No JVD. S1-S2 present, rhythmic, no gallops, rubs, or murmurs. No lower extremity edema. Pulmonary: positive  breath sounds bilaterally, adequate air movement, no wheezing, rhonchi or rales. Gastrointestinal. Abdomen soft and non tender Skin. No rashes Musculoskeletal: no joint deformities     Data Reviewed: I have personally reviewed following labs and imaging studies  CBC: Recent Labs  Lab 03/25/20 1420 03/25/20 1446 03/26/20 0446 03/27/20 0341  WBC 6.8  --  8.0 8.5  NEUTROABS 5.3  --   --   --   HGB 16.1 17.7* 12.6* 12.2*  HCT 48.6 52.0 38.8* 36.5*  MCV 86.9  --  87.6 86.1  PLT 243  --  192 210   Basic Metabolic Panel: Recent Labs  Lab 03/25/20 1603 03/25/20 2315 03/26/20 0135 03/26/20 0446 03/27/20 1505 03/28/20 0244 03/29/20 0333  NA 130*   < > 131* 132* 134* 134* 130*  K 3.9   < > 3.9 3.7 4.4 5.4* 4.2  CL 92*   < > 98 97* 97* 97* 94*  CO2 28   < > 26 27 32 33* 33*  GLUCOSE 365*   < > 121* 115* 150* 335* 348*  BUN 21   < >  18 18 12 21 21  CREATININE 1.00   < > 0.93 0.86 0.99 1.31* 1.20  CALCIUM 8.6*   < > 8.2* 8.3* 8.1* 8.7* 8.4*  MG 2.1  --   --   --  1.9  --   --   PHOS 2.8  --   --   --  3.5  --   --    < > = values in this interval not displayed.   GFR: Estimated Creatinine Clearance: 47.6 mL/min (by C-G formula based on SCr of 1.2 mg/dL). Liver Function Tests: Recent Labs  Lab 03/25/20 1420 03/26/20 0446  AST 36 27  ALT 27 20  ALKPHOS 82 54  BILITOT 0.8 0.6  PROT 8.1 5.9*  ALBUMIN 3.8 2.9*   Recent Labs  Lab 03/25/20 1420  LIPASE 26   No results for input(s): AMMONIA in the last 168 hours. Coagulation Profile: No results for input(s): INR, PROTIME in the last 168 hours. Cardiac  Enzymes: No results for input(s): CKTOTAL, CKMB, CKMBINDEX, TROPONINI in the last 168 hours. BNP (last 3 results) No results for input(s): PROBNP in the last 8760 hours. HbA1C: No results for input(s): HGBA1C in the last 72 hours. CBG: Recent Labs  Lab 03/28/20 1221 03/28/20 1610 03/28/20 2200 03/29/20 0624 03/29/20 1259  GLUCAP 322* 262* 236* 333* 396*   Lipid Profile: No results for input(s): CHOL, HDL, LDLCALC, TRIG, CHOLHDL, LDLDIRECT in the last 72 hours. Thyroid Function Tests: No results for input(s): TSH, T4TOTAL, FREET4, T3FREE, THYROIDAB in the last 72 hours. Anemia Panel: No results for input(s): VITAMINB12, FOLATE, FERRITIN, TIBC, IRON, RETICCTPCT in the last 72 hours.    Radiology Studies: I have reviewed all of the imaging during this hospital visit personally     Scheduled Meds: . amitriptyline  20 mg Oral QHS  . aspirin EC  81 mg Oral Daily  . atorvastatin  40 mg Oral Daily  . cyclobenzaprine  5 mg Oral TID  . famotidine  20 mg Oral Daily  . feeding supplement  237 mL Oral TID BM  . folic acid  1 mg Oral Daily  . gabapentin  1,200 mg Oral QHS  . insulin aspart  0-5 Units Subcutaneous QHS  . insulin aspart  0-9 Units Subcutaneous TID WC  . insulin glargine  10 Units Subcutaneous QHS  . losartan  25 mg Oral Daily  . melatonin  12 mg Oral QHS  . multivitamin with minerals  1 tablet Oral Daily  . nicotine  21 mg Transdermal Daily  . polyethylene glycol  17 g Oral BID  . sucralfate  1 g Oral TID WC & HS  . thiamine  100 mg Oral Daily   Continuous Infusions:   LOS: 4 days        Zeniah Briney Daniel Barnie Sopko, MD   

## 2020-03-30 ENCOUNTER — Inpatient Hospital Stay (HOSPITAL_COMMUNITY): Payer: No Typology Code available for payment source

## 2020-03-30 ENCOUNTER — Encounter (HOSPITAL_COMMUNITY): Payer: Self-pay | Admitting: Internal Medicine

## 2020-03-30 ENCOUNTER — Inpatient Hospital Stay (HOSPITAL_COMMUNITY): Payer: No Typology Code available for payment source | Admitting: Certified Registered"

## 2020-03-30 ENCOUNTER — Encounter (HOSPITAL_COMMUNITY)
Admission: EM | Disposition: A | Discharge status: Home / Self Care | Payer: Self-pay | Source: Home / Self Care | Attending: Internal Medicine

## 2020-03-30 DIAGNOSIS — I340 Nonrheumatic mitral (valve) insufficiency: Secondary | ICD-10-CM | POA: Diagnosis not present

## 2020-03-30 DIAGNOSIS — I6330 Cerebral infarction due to thrombosis of unspecified cerebral artery: Secondary | ICD-10-CM | POA: Diagnosis not present

## 2020-03-30 DIAGNOSIS — E1159 Type 2 diabetes mellitus with other circulatory complications: Secondary | ICD-10-CM | POA: Diagnosis not present

## 2020-03-30 DIAGNOSIS — F1490 Cocaine use, unspecified, uncomplicated: Secondary | ICD-10-CM | POA: Diagnosis not present

## 2020-03-30 DIAGNOSIS — F1910 Other psychoactive substance abuse, uncomplicated: Secondary | ICD-10-CM | POA: Diagnosis not present

## 2020-03-30 DIAGNOSIS — I639 Cerebral infarction, unspecified: Secondary | ICD-10-CM | POA: Diagnosis not present

## 2020-03-30 DIAGNOSIS — E43 Unspecified severe protein-calorie malnutrition: Secondary | ICD-10-CM | POA: Diagnosis not present

## 2020-03-30 HISTORY — PX: TEE WITHOUT CARDIOVERSION: SHX5443

## 2020-03-30 HISTORY — PX: BUBBLE STUDY: SHX6837

## 2020-03-30 LAB — GLUCOSE, CAPILLARY
Glucose-Capillary: 231 mg/dL — ABNORMAL HIGH (ref 70–99)
Glucose-Capillary: 177 mg/dL — ABNORMAL HIGH (ref 70–99)
Glucose-Capillary: 385 mg/dL — ABNORMAL HIGH (ref 70–99)

## 2020-03-30 SURGERY — ECHOCARDIOGRAM, TRANSESOPHAGEAL
Anesthesia: Monitor Anesthesia Care

## 2020-03-30 MED ORDER — SODIUM CHLORIDE 0.9 % IV SOLN: INTRAVENOUS | Status: DC

## 2020-03-30 MED ORDER — PROPOFOL 500 MG/50ML IV EMUL
INTRAVENOUS | Status: DC | PRN
  Administered 2020-03-30: 100 ug/kg/min via INTRAVENOUS

## 2020-03-30 MED ORDER — PROPOFOL 10 MG/ML IV BOLUS
INTRAVENOUS | Status: DC | PRN
  Administered 2020-03-30 (×3): 20 mg via INTRAVENOUS

## 2020-03-30 MED ORDER — ENSURE ENLIVE PO LIQD
237.0000 mL | Freq: Three times a day (TID) | ORAL | 0 refills | Status: AC
Start: 2020-03-30 — End: 2020-04-29

## 2020-03-30 MED ORDER — HYDROCODONE-ACETAMINOPHEN 5-325 MG PO TABS: 1.0000 | ORAL_TABLET | Freq: Four times a day (QID) | ORAL | 0 refills | Status: DC | PRN

## 2020-03-30 MED ORDER — LIDOCAINE 2% (20 MG/ML) 5 ML SYRINGE
INTRAMUSCULAR | Status: DC | PRN
  Administered 2020-03-30: 80 mg via INTRAVENOUS

## 2020-03-30 MED ORDER — FAMOTIDINE 20 MG PO TABS: 20.0000 mg | ORAL_TABLET | Freq: Every day | ORAL | 0 refills | Status: DC

## 2020-03-30 MED ORDER — CYCLOBENZAPRINE HCL 5 MG PO TABS: 5.0000 mg | ORAL_TABLET | Freq: Three times a day (TID) | ORAL | 0 refills | Status: DC | PRN

## 2020-03-30 MED ORDER — ASPIRIN 81 MG PO TBEC
81.0000 mg | DELAYED_RELEASE_TABLET | Freq: Every day | ORAL | 0 refills | Status: AC
Start: 2020-03-31 — End: 2020-04-30

## 2020-03-30 MED ORDER — POLYETHYLENE GLYCOL 3350 17 G PO PACK: 17.0000 g | PACK | Freq: Every day | ORAL | 0 refills | Status: DC | PRN

## 2020-03-30 NOTE — Progress Notes (Signed)
Pt wheeled out by this RN

## 2020-03-30 NOTE — Discharge Summary (Addendum)
Physician Discharge Summary  William Acevedo TRV:[id number] DOB: [birth year] DOA: 03/25/2020  PCP: William Messier, MD  Admit date: 03/25/2020 Discharge date: 03/30/2020  Admitted From: home  Disposition:  Home   Recommendations for Outpatient Follow-up and new medication changes:  1. Follow up with Dr. Joesph July in 7 days.  2. Neurology follow up as outpatient 3. Continue aspirin and statin. 4. Avoid cocaine use.   Home Health: no   Equipment/Devices: no   Discharge Condition: stable  CODE STATUS: full  Diet recommendation: hearth healthy and diabetic prudent.   Brief/Interim Summary: William Acevedo was admitted to the hospital with working diagnosis of acute embolic stroke, in the setting of cocaine intoxication.  65 year old male past medical history for type 2 diabetes mellitus, hypertension and dyslipidemia. He also has history of depressive disorder,andpolysubstance abuse. He developed abdominal pain, for several weeks, localized at the epigastrium. At home used cocaine to treat abdominal pain. Reportedloosingright hand grip, unable to handle small objects or tie his shoes. Neurologic deficit acute onset about 5 hours prior to hospitalization. On his initial physical examination blood pressure 196/101, heart rate 103, respiratory rate 16, oxygen saturation 98%. His lungs were clear to auscultation bilaterally, heart S1-S2, present rhythmic, soft abdomen, no lower extremity edema.  Sodium 128, potassium 4.0, chloride 86, bicarb 28, glucose 539, BUN 23, creatinine 1.23, troponin I [phone].White count 6.8, hemoglobin 16.1, hematocrit 40.6, platelets 243. SARS COVID-19 negative. Urinalysis negative for infection.  Toxicology screen positive for opiates, cocaine and tetrahydrocannabinol.  CT angiography no dissection. Significant atherosclerosis. Hypoperfusion of inferior right kidney,likely infarction. Head CT with small, stable chronic left occipital lobe  infarct.  EKG 108 bpm, left axis deviation, normal intervals, sinus rhythm, Q-wave lead II, lead III and aVF, J-point elevation V3-V5, no significant T wave changes, positive LVH  Follow up workup with brain MRI with multifocal ischemia within both cerebellar hemispheres, right occipital lobe and both frontal lobes.  Patient ruled out for acute coronary syndrome.   TEE with positive bubble study with valsalva/ pushing on abdomen. Possible small vegetation attached to mitral annular calcification vs degeneration.   1.  Acute ischemic infarct, multifocal, both cerebellar hemispheres, right occipital lobe and frontal lobes.  Patient was admitted to the medical ward, he was placed on remote telemetry monitor.  Patient had paresthesias that slowly improved along with his strength.  Further work-up with transthoracic echocardiogram showed no thrombus or vegetation.   CTA head and neck showed no emergent large vessel occlusion, greater than 50% stenosis of the right proximal cavernous segment of the left ICA, approximate 50% stenosis of the mid cavernous right ICA.  Lipid panel, total cholesterol 205, HDL 57, LDL 137, triglycerides 53, VLDL 11  Patient underwent a transesophageal echocardiography which showed a possible bubble study.  Moderate mitral annular calcification, small mobile echodensity attached to the mitral annular calcification, possible degeneration cannot exclude small vegetation.  Patient will continue therapy with aspirin and atorvastatin.  Was advised about avoiding cocaine use. He has no signs of systemic bacterial infection or endocarditis.  2.  Cocaine intoxication with elevated troponin.  On admission patient had elevated troponins, that follow a flat pattern [phone]).  EKG no ischemic changes.  Echocardiography with no wall motion normalities.  Patient ruled out for acute coronary syndrome. Continue aspirin and statin. Patient was advised about cocaine  cessation.  3.  Abdominal pain/constipation.  Patient had reproducible pain to palpation at the lower abdomen, imaging of the abdomen has been negative for dissection. Patient  has been placed on antiacid therapy and muscle relaxants.  Bowel regimen with MiraLAX and Dulcolax.  Patient was advised to avoid opiates.  Cannot rule out transient ischemia related to cocaine use.  4.  Hypertension.  Continue blood pressure control with losartan.  5.  Type 2 diabetes mellitus, uncontrolled hyper/hypoglycemia.  Patient was placed on insulin, basal and sliding scale for glucose control.  His capillary glucose remained elevated. At home patient will continue insulin therapy.  6.  Severe calorie protein malnutrition continue attritional supplements.  7.  Depression/tobacco abuse, chronic pain syndrome.  Patient will continue amitriptyline, he was placed on nicotine patch during hospitalization. He was advised to return to behavioral health as an outpatient at the [place].  8.  Acute kidney injury with hyperkalemia.  Patient received supportive medical therapy with improvement of kidney function electrolytes. At discharge sodium 130, potassium 4.2, chloride 94, bicarb 33, glucose 348, BUN 31, creatinine 1.20.  Pseudohyponatremia due to hyperglycemia.  Discharge Diagnoses:  Principal Problem:   Cerebral thrombosis with cerebral infarction Active Problems:   MDD (major depressive disorder), recurrent episode, mild (HCC)   Polysubstance abuse ([place])   DM2 (diabetes mellitus, type 2) ([place])   Cocaine use   Secondary diabetes mellitus with HHNC (hyperglycemia hyperosmolar non-ketotic coma) (HCC)   Protein-calorie malnutrition, severe    Discharge Instructions   Allergies as of 03/30/2020      Reactions   Trazodone    Other reaction(s): Priapism   Lisinopril Swelling   angioedema   Omeprazole    Other reaction(s): ANGIOEDEMA OF LIPS, Angioedema of tongue.      Medication List    TAKE these  medications   amitriptyline 10 MG tablet Commonly known as: ELAVIL Take 20 mg by mouth at bedtime.   aspirin 81 MG EC tablet Take 1 tablet (81 mg total) by mouth daily. Swallow whole. Start taking on: March 31, 2020   atorvastatin 20 MG tablet Commonly known as: LIPITOR Take 20 mg by mouth daily.   blood glucose meter kit and supplies Kit Dispense based on patient and insurance preference. Use up to four times daily as directed. (FOR ICD-9 250.00, 250.01).   cyclobenzaprine 5 MG tablet Commonly known as: FLEXERIL Take 1 tablet (5 mg total) by mouth 3 (three) times daily as needed for muscle spasms.   empagliflozin 25 MG Tabs tablet Commonly known as: JARDIANCE Take 25 mg by mouth daily.   famotidine 20 MG tablet Commonly known as: PEPCID Take 1 tablet (20 mg total) by mouth daily. Start taking on: March 31, 2020   feeding supplement Liqd Take 237 mLs by mouth 3 (three) times daily between meals.   gabapentin 600 MG tablet Commonly known as: NEURONTIN Take 1,200 mg by mouth at bedtime.   glipiZIDE 10 MG tablet Commonly known as: GLUCOTROL Take 1 tablet (10 mg total) by mouth daily before breakfast. What changed: when to take this   HYDROcodone-acetaminophen 5-325 MG tablet Commonly known as: NORCO/VICODIN Take 1 tablet by mouth every 6 (six) hours as needed for severe pain. What changed: when to take this   insulin glargine 100 UNIT/ML Solostar Pen Commonly known as: LANTUS Inject 40 Units into the skin daily.   Insulin Pen Needle 29G X 12MM Misc Per instructions   losartan 25 MG tablet Commonly known as: COZAAR Take 25 mg by mouth daily.   melatonin 3 MG Tabs tablet Take 12 mg by mouth at bedtime.   NovoLOG FlexPen 100 UNIT/ML FlexPen Generic drug: insulin aspart  Inject 5 Units into the skin 3 (three) times daily with meals.   polyethylene glycol 17 g packet Commonly known as: MIRALAX / GLYCOLAX Take 17 g by mouth daily as needed for moderate  constipation.   tadalafil 10 MG tablet Commonly known as: CIALIS Take 20 mg by mouth daily as needed for erectile dysfunction.       Allergies  Allergen Reactions  . Trazodone     Other reaction(s): Priapism  . Lisinopril Swelling    angioedema  . Omeprazole     Other reaction(s): ANGIOEDEMA OF LIPS, Angioedema of tongue.    Consultations:  Neurology   Cardiology    Procedures/Studies: CT ANGIO HEAD W OR WO CONTRAST  Result Date: 03/27/2020 CLINICAL DATA:  Stroke follow-up EXAM: CT ANGIOGRAPHY HEAD AND NECK TECHNIQUE: Multidetector CT imaging of the head and neck was performed using the standard protocol during bolus administration of intravenous contrast. Multiplanar CT image reconstructions and MIPs were obtained to evaluate the vascular anatomy. Carotid stenosis measurements (when applicable) are obtained utilizing NASCET criteria, using the distal internal carotid diameter as the denominator. CONTRAST:  5m OMNIPAQUE IOHEXOL 350 MG/ML SOLN COMPARISON:  Brain MRI 03/25/2020 FINDINGS: CT HEAD FINDINGS Brain: There is no mass, hemorrhage or extra-axial collection. The size and configuration of the ventricles and extra-axial CSF spaces are normal. Old left occipital lobe infarct. Infarcts characterized on MRI are not clearly visible on CT. Skull: The visualized skull base, calvarium and extracranial soft tissues are normal. Sinuses/Orbits: No fluid levels or advanced mucosal thickening of the visualized paranasal sinuses. No mastoid or middle ear effusion. The orbits are normal. CTA NECK FINDINGS SKELETON: There is no bony spinal canal stenosis. No lytic or blastic lesion. OTHER NECK: Normal pharynx, larynx and major salivary glands. No cervical lymphadenopathy. Unremarkable thyroid gland. UPPER CHEST: No pneumothorax or pleural effusion. No nodules or masses. AORTIC ARCH: There is calcific atherosclerosis of the aortic arch. There is no aneurysm, dissection or hemodynamically  significant stenosis of the visualized portion of the aorta. Conventional 3 vessel aortic branching pattern. The visualized proximal subclavian arteries are widely patent. RIGHT CAROTID SYSTEM: No dissection, occlusion or aneurysm. There is mixed density atherosclerosis extending into the proximal ICA, resulting in less than 50% stenosis. LEFT CAROTID SYSTEM: No dissection, occlusion or aneurysm. There is mixed density atherosclerosis extending into the proximal ICA, resulting in less than 50% stenosis. VERTEBRAL ARTERIES: Left dominant configuration. Both origins are clearly patent. There is no dissection, occlusion or flow-limiting stenosis to the skull base (V1-V3 segments). CTA HEAD FINDINGS POSTERIOR CIRCULATION: --Vertebral arteries: Normal V4 segments. --Inferior cerebellar arteries: Normal. --Basilar artery: Normal. --Superior cerebellar arteries: Normal. --Posterior cerebral arteries (PCA): Normal. ANTERIOR CIRCULATION: --Intracranial internal carotid arteries: Greater than 50% stenosis of the proximal cavernous segment of the left ICA. Approximately 50% stenosis of the mid cavernous right ICA. --Anterior cerebral arteries (ACA): Normal. Hypoplastic right A1 segment, normal variant. --Middle cerebral arteries (MCA): Normal. VENOUS SINUSES: As permitted by contrast timing, patent. ANATOMIC VARIANTS: None Review of the MIP images confirms the above findings. IMPRESSION: 1. No emergent large vessel occlusion. 2. Greater than 50% stenosis of the proximal cavernous segment of the left ICA. 3. Approximately 50% stenosis of the mid cavernous right ICA. Bilateral carotid bifurcation atherosclerosis without hemodynamically significant stenosis by NASCET criteria. Aortic Atherosclerosis (ICD10-I70.0). Electronically Signed   By: KUlyses JarredM.D.   On: 03/27/2020 19:23   CT Head Wo Contrast  Result Date: 03/25/2020 CLINICAL DATA:  Right-sided weakness. EXAM: CT HEAD  WITHOUT CONTRAST TECHNIQUE: Contiguous axial  images were obtained from the base of the skull through the vertex without intravenous contrast. COMPARISON:  April 06, 2019 and January 18, 2019 FINDINGS: Brain: No evidence of acute infarction, hemorrhage, hydrocephalus, extra-axial collection or mass lesion/mass effect. A small, stable area of white matter low attenuation is seen within the left occipital lobe. There is no evidence of associated mass effect or midline shift. While this is stable in appearance when compared to the prior plain brain CT, dated April 06, 2019, it does represent a new finding when compared to the earlier exam from January 18, 2019. Vascular: No hyperdense vessel or unexpected calcification. Skull: A chronic left-sided nasal bone fracture is seen. Sinuses/Orbits: No acute finding. Other: Mild left posterior parietal scalp soft tissue swelling is seen. IMPRESSION: 1. Findings suggestive of a small, stable chronic left occipital lobe infarct. MRI correlation is recommended. 2. No acute intracranial abnormality. Electronically Signed   By: Virgina Norfolk M.D.   On: 03/25/2020 19:27   CT ANGIO NECK W OR WO CONTRAST  Result Date: 03/27/2020 CLINICAL DATA:  Stroke follow-up EXAM: CT ANGIOGRAPHY HEAD AND NECK TECHNIQUE: Multidetector CT imaging of the head and neck was performed using the standard protocol during bolus administration of intravenous contrast. Multiplanar CT image reconstructions and MIPs were obtained to evaluate the vascular anatomy. Carotid stenosis measurements (when applicable) are obtained utilizing NASCET criteria, using the distal internal carotid diameter as the denominator. CONTRAST:  37m OMNIPAQUE IOHEXOL 350 MG/ML SOLN COMPARISON:  Brain MRI 03/25/2020 FINDINGS: CT HEAD FINDINGS Brain: There is no mass, hemorrhage or extra-axial collection. The size and configuration of the ventricles and extra-axial CSF spaces are normal. Old left occipital lobe infarct. Infarcts characterized on MRI are not clearly visible  on CT. Skull: The visualized skull base, calvarium and extracranial soft tissues are normal. Sinuses/Orbits: No fluid levels or advanced mucosal thickening of the visualized paranasal sinuses. No mastoid or middle ear effusion. The orbits are normal. CTA NECK FINDINGS SKELETON: There is no bony spinal canal stenosis. No lytic or blastic lesion. OTHER NECK: Normal pharynx, larynx and major salivary glands. No cervical lymphadenopathy. Unremarkable thyroid gland. UPPER CHEST: No pneumothorax or pleural effusion. No nodules or masses. AORTIC ARCH: There is calcific atherosclerosis of the aortic arch. There is no aneurysm, dissection or hemodynamically significant stenosis of the visualized portion of the aorta. Conventional 3 vessel aortic branching pattern. The visualized proximal subclavian arteries are widely patent. RIGHT CAROTID SYSTEM: No dissection, occlusion or aneurysm. There is mixed density atherosclerosis extending into the proximal ICA, resulting in less than 50% stenosis. LEFT CAROTID SYSTEM: No dissection, occlusion or aneurysm. There is mixed density atherosclerosis extending into the proximal ICA, resulting in less than 50% stenosis. VERTEBRAL ARTERIES: Left dominant configuration. Both origins are clearly patent. There is no dissection, occlusion or flow-limiting stenosis to the skull base (V1-V3 segments). CTA HEAD FINDINGS POSTERIOR CIRCULATION: --Vertebral arteries: Normal V4 segments. --Inferior cerebellar arteries: Normal. --Basilar artery: Normal. --Superior cerebellar arteries: Normal. --Posterior cerebral arteries (PCA): Normal. ANTERIOR CIRCULATION: --Intracranial internal carotid arteries: Greater than 50% stenosis of the proximal cavernous segment of the left ICA. Approximately 50% stenosis of the mid cavernous right ICA. --Anterior cerebral arteries (ACA): Normal. Hypoplastic right A1 segment, normal variant. --Middle cerebral arteries (MCA): Normal. VENOUS SINUSES: As permitted by  contrast timing, patent. ANATOMIC VARIANTS: None Review of the MIP images confirms the above findings. IMPRESSION: 1. No emergent large vessel occlusion. 2. Greater than 50% stenosis of the proximal  cavernous segment of the left ICA. 3. Approximately 50% stenosis of the mid cavernous right ICA. Bilateral carotid bifurcation atherosclerosis without hemodynamically significant stenosis by NASCET criteria. Aortic Atherosclerosis (ICD10-I70.0). Electronically Signed   By: Ulyses Jarred M.D.   On: 03/27/2020 19:23   MR BRAIN WO CONTRAST  Result Date: 03/25/2020 CLINICAL DATA:  Right arm weakness EXAM: MRI HEAD WITHOUT CONTRAST TECHNIQUE: Multiplanar, multiecho pulse sequences of the brain and surrounding structures were obtained without intravenous contrast. COMPARISON:  Head CT 03/25/2020 FINDINGS: Brain: There is multifocal abnormal diffusion restriction within both cerebellar hemispheres, the right occipital lobe and both frontal lobes. No acute or chronic hemorrhage. There is multifocal hyperintense T2-weighted signal within the white matter. Parenchymal volume and CSF spaces are normal. The midline structures are normal. Vascular: Major flow voids are preserved. Skull and upper cervical spine: Normal calvarium and skull base. Visualized upper cervical spine and soft tissues are normal. Sinuses/Orbits:No paranasal sinus fluid levels or advanced mucosal thickening. No mastoid or middle ear effusion. Normal orbits. IMPRESSION: Multifocal acute ischemia within both cerebellar hemispheres, the right occipital lobe and both frontal lobes. No hemorrhage or mass effect. Electronically Signed   By: Ulyses Jarred M.D.   On: 03/25/2020 22:10   CT ABDOMEN PELVIS W CONTRAST  Result Date: 03/15/2020 CLINICAL DATA:  Acute generalized abdominal pain. EXAM: CT ABDOMEN AND PELVIS WITH CONTRAST TECHNIQUE: Multidetector CT imaging of the abdomen and pelvis was performed using the standard protocol following bolus administration  of intravenous contrast. CONTRAST:  19m OMNIPAQUE IOHEXOL 300 MG/ML  SOLN COMPARISON:  March 14, 2020. FINDINGS: Lower chest: No acute abnormality. Hepatobiliary: No focal liver abnormality is seen. No gallstones, gallbladder wall thickening, or biliary dilatation. Pancreas: Unremarkable. No pancreatic ductal dilatation or surrounding inflammatory changes. Spleen: Normal in size without focal abnormality. Adrenals/Urinary Tract: Adrenal glands appear normal. Left kidney appears normal. Small nonobstructive right renal calculus is noted. Right renal cyst is noted. There is again noted decreased perfusion of the lower pole of the right kidney which is concerning for some degree of infarction; pyelonephritis is also concern, but no definite inflammatory changes are noted in this area. No hydronephrosis or renal obstruction is noted. Urinary bladder is unremarkable. Stomach/Bowel: Stomach is within normal limits. Appendix appears normal. No evidence of bowel wall thickening, distention, or inflammatory changes. Vascular/Lymphatic: Aortic atherosclerosis. No enlarged abdominal or pelvic lymph nodes. Reproductive: Prostate is unremarkable. Other: No abdominal wall hernia or abnormality. No abdominopelvic ascites. Musculoskeletal: No acute or significant osseous findings. IMPRESSION: 1. There is again noted decreased perfusion of the lower pole of the right kidney which is most concerning for some degree of renal infarction; pyelonephritis may be considered, but no definite inflammatory changes are noted in this area. 2. Small nonobstructive right renal calculus. 3. Aortic atherosclerosis. Aortic Atherosclerosis (ICD10-I70.0). Electronically Signed   By: JMarijo ConceptionM.D.   On: 03/15/2020 17:15   ECHOCARDIOGRAM COMPLETE  Result Date: 03/26/2020    ECHOCARDIOGRAM REPORT   Patient Name:   CJAECE DUCHARMEDate of Exam: 03/26/2020 Medical Rec #:  [id number]          Height:       66.0 in Accession #:     [id number]         Weight:       118.0 lb Date of Birth:  [birth year]          BSA:          1.598 m Patient Age:  65 years            BP:           122/71 mmHg Patient Gender: M                   HR:           89 bpm. Exam Location:  Inpatient Procedure: 2D Echo, Color Doppler and Cardiac Doppler Indications:    NSTEMI  History:        Patient has no prior history of Echocardiogram examinations.                 Risk Factors:Diabetes, Hypertension, elevated troponin and                 Current Smoker. Polysubstance abuse.  Sonographer:    Dustin Flock Referring Phys: [id number] [place] IMPRESSIONS  1. Left ventricular ejection fraction, by estimation, is 65 to 70%. The left ventricle has normal function. The left ventricle has no regional wall motion abnormalities. There is mild concentric left ventricular hypertrophy. Left ventricular diastolic parameters are indeterminate.  2. Right ventricular systolic function is normal. The right ventricular size is normal. Tricuspid regurgitation signal is inadequate for assessing PA pressure.  3. The mitral valve is abnormal. Mild mitral valve regurgitation. No evidence of mitral stenosis. Moderate to severe mitral annular calcification.  4. The aortic valve has an indeterminant number of cusps. Aortic valve regurgitation is not visualized.  5. The inferior vena cava is normal in size with greater than 50% respiratory variability, suggesting right atrial pressure of 3 mmHg. Comparison(s): No prior Echocardiogram. Conclusion(s)/Recommendation(s): Normal biventricular function without evidence of hemodynamically significant valvular heart disease. FINDINGS  Left Ventricle: Left ventricular ejection fraction, by estimation, is 65 to 70%. The left ventricle has normal function. The left ventricle has no regional wall motion abnormalities. The left ventricular internal cavity size was normal in size. There is  mild concentric left ventricular hypertrophy. Left ventricular  diastolic parameters are indeterminate. Right Ventricle: The right ventricular size is normal. No increase in right ventricular wall thickness. Right ventricular systolic function is normal. Tricuspid regurgitation signal is inadequate for assessing PA pressure. Left Atrium: Left atrial size was normal in size. Right Atrium: Right atrial size was normal in size. Prominent Eustachian valve. Pericardium: There is no evidence of pericardial effusion. Mitral Valve: The mitral valve is abnormal. Moderate to severe mitral annular calcification. Mild mitral valve regurgitation. No evidence of mitral valve stenosis. Tricuspid Valve: The tricuspid valve is normal in structure. Tricuspid valve regurgitation is trivial. No evidence of tricuspid stenosis. Aortic Valve: The aortic valve has an indeterminant number of cusps. Aortic valve regurgitation is not visualized. Pulmonic Valve: The pulmonic valve was not well visualized. Pulmonic valve regurgitation is not visualized. Aorta: The aortic root, ascending aorta, aortic arch and descending aorta are all structurally normal, with no evidence of dilitation or obstruction. Venous: The inferior vena cava is normal in size with greater than 50% respiratory variability, suggesting right atrial pressure of 3 mmHg. IAS/Shunts: The atrial septum is grossly normal.  LEFT VENTRICLE PLAX 2D LVIDd:         3.80 cm  Diastology LVIDs:         2.20 cm  LV e' medial:    5.66 cm/s LV PW:         1.10 cm  LV E/e' medial:  13.9 LV IVS:        1.10 cm  LV e' lateral:  6.85 cm/s LVOT diam:     1.80 cm  LV E/e' lateral: 11.5 LV SV:         68 LV SV Index:   42 LVOT Area:     2.54 cm  RIGHT VENTRICLE RV Basal diam:  2.70 cm RV S prime:     11.40 cm/s TAPSE (M-mode): 2.5 cm LEFT ATRIUM             Index       RIGHT ATRIUM          Index LA diam:        2.50 cm 1.56 cm/m  RA Area:     9.23 cm LA Vol (A2C):   45.2 ml 28.28 ml/m RA Volume:   20.00 ml 12.52 ml/m LA Vol (A4C):   30.2 ml 18.90 ml/m  LA Biplane Vol: 40.7 ml 25.47 ml/m  AORTIC VALVE LVOT Vmax:   101.00 cm/s LVOT Vmean:  72.300 cm/s LVOT VTI:    0.266 m  AORTA Ao Root diam: 3.10 cm MITRAL VALVE MV Area (PHT): 4.06 cm     SHUNTS MV Decel Time: 187 msec     Systemic VTI:  0.27 m MV E velocity: 78.80 cm/s   Systemic Diam: 1.80 cm MV A velocity: 112.00 cm/s MV E/A ratio:  0.70 Buford Dresser MD Electronically signed by Buford Dresser MD Signature Date/Time: 03/26/2020/2:01:22 PM    Final    CT Angio Chest/Abd/Pel for Dissection W and/or Wo Contrast  Result Date: 03/25/2020 CLINICAL DATA:  Abdominal pain, concern for aortic dissection EXAM: CT ANGIOGRAPHY CHEST, ABDOMEN AND PELVIS TECHNIQUE: Non-contrast CT of the chest was initially obtained. Multidetector CT imaging through the chest, abdomen and pelvis was performed using the standard protocol during bolus administration of intravenous contrast. Multiplanar reconstructed images and MIPs were obtained and reviewed to evaluate the vascular anatomy. CONTRAST:  184m OMNIPAQUE IOHEXOL 350 MG/ML SOLN COMPARISON:  03/14/2020 FINDINGS: CTA CHEST FINDINGS Cardiovascular: Stable caliber of the thoracic aorta with atherosclerotic plaque. No evidence of intramural hematoma or dissection. Noncalcified plaque along the proximal left subclavian causes 50% stenosis. No evidence of central pulmonary embolus. Normal heart size. Coronary artery calcification. No pericardial effusion. Mediastinum/Nodes: No enlarged lymph nodes identified. Thyroid and esophagus are unremarkable. Lungs/Pleura: Mild paraseptal emphysema. No new consolidation. No pleural effusion or pneumothorax. Musculoskeletal: No acute osseous abnormality. Review of the MIP images confirms the above findings. CTA ABDOMEN AND PELVIS FINDINGS VASCULAR Aorta: Normal in caliber. Diffuse mixed but primarily calcified plaque. No evidence of dissection. As before, there is marked plaque at the bifurcation with luminal narrowing. Celiac:  Patent origin with stable minimal narrowing. SMA: Patent origin with stable mild narrowing. Renals: Patent with stable at least moderate narrowing of the left renal artery origin. Patent small bilateral accessory renal arteries. The inferior right accessory renal artery is small in caliber with likely significant origin stenosis. IMA: Patent with stable moderate to marked stenosis at the origin. Inflow: Patent common iliacs with normal caliber and mixed but primarily calcified plaque. Patent internal and external iliacs. Veins: Not well evaluated. Review of the MIP images confirms the above findings. NON-VASCULAR Hepatobiliary: No significant abnormality. Pancreas: Unremarkable. Spleen: Unremarkable Adrenals/Urinary Tract: Adrenals are unremarkable. There is persistent hypoenhancement of the lower pole of the right kidney. Additional small areas of hypoenhancement are present within the cortex in the interpolar region. Punctate nonobstructing calculus of the interpolar right kidney. Bladder is unremarkable. Stomach/Bowel: Stomach is within normal limits. Bowel is normal in caliber. Paucity  of mesenteric fat somewhat limits evaluation. Lymphatic: No enlarged lymph nodes identified. Reproductive: Unremarkable. Other: No ascites.  Abdominal wall is unremarkable. Musculoskeletal: No acute osseous abnormality. Review of the MIP images confirms the above findings. IMPRESSION: No substantial change since 03/14/2020. There is no evidence of aortic dissection. Significant atherosclerosis throughout. As before, there is stenosis at the abdominal aorta branch origins and bifurcation. Hypoperfusion of the inferior right kidney likely with some areas of infarction as before. There is a small caliber accessory right renal artery primarily supplying the lower pole with high-grade origin stenosis. Electronically Signed   By: Macy Mis M.D.   On: 03/25/2020 16:41   CT Angio Chest/Abd/Pel for Dissection W and/or W/WO  Result  Date: 03/14/2020 CLINICAL DATA:  Abdominal pain, suspected aortic dissection. Severe umbilical pain. Possible mesenteric ischemia. Abdominal aortic aneurysm. EXAM: CT ANGIOGRAPHY CHEST, ABDOMEN AND PELVIS TECHNIQUE: Non-contrast CT of the chest was initially obtained. Multidetector CT imaging through the chest, abdomen and pelvis was performed using the standard protocol during bolus administration of intravenous contrast. Multiplanar reconstructed images and MIPs were obtained and reviewed to evaluate the vascular anatomy. CONTRAST:  164m OMNIPAQUE IOHEXOL 350 MG/ML SOLN COMPARISON:  None. FINDINGS: CTA CHEST FINDINGS Cardiovascular: Heart is normal size. There is calcified plaque over the left main, left anterior descending and right coronary arteries. Mild prominence of the ascending thoracic aorta measuring 3.5 cm in diameter. No evidence of aneurysm or dissection involving the thoracic aorta. There is calcified plaque over the thoracic aorta. Common takeoff of the right brachiocephalic and left common carotid arteries. Moderate calcified plaque over the proximal left subclavian artery with mild focal narrowing 3 cm distal to its origin. Pulmonary arterial system is unremarkable. Mediastinum/Nodes: No mediastinal or hilar adenopathy. Remaining mediastinal structures are unremarkable. Lungs/Pleura: Lungs are adequately inflated. No focal airspace consolidation or effusion. Subtle paraseptal emphysema over the apices. Small calcified granuloma over the left lower lobe. Airways are normal. Musculoskeletal: No focal abnormality. Review of the MIP images confirms the above findings. CTA ABDOMEN AND PELVIS FINDINGS VASCULAR Aorta: Abdominal aorta demonstrates no evidence of aneurysm or dissection. There is moderate calcified plaque throughout the abdominal aorta. There is focal narrowing of the abdominal aorta at the bifurcation. Celiac: Minimal narrowing at the origin of the celiac axis which is otherwise patent.  SMA: Moderate calcified plaque at the origin of the superior mesenteric artery which is patent. Renals: Calcified plaque at the origin of the renal arteries bilaterally. Approximately 50% focal stenosis of the origin of the left renal artery with minimal poststenotic dilatation. Single bilateral small accessory renal arteries are also present. IMA: Patent. Inflow: Calcified plaque over the common iliac arteries. Internal and external iliac arteries are patent without focal stenosis or occlusion. Veins: No obvious venous abnormality within the limitations of this arterial phase study. Review of the MIP images confirms the above findings. NON-VASCULAR Hepatobiliary: Liver, gallbladder and biliary tree are normal. Pancreas: Normal. Spleen: Normal. Adrenals/Urinary Tract: Adrenal glands are normal. Kidneys are normal in size. Nonobstructing punctate stone over the lower pole right kidney. Couple small right renal cysts are present. There is moderate decreased perfusion of the lower pole of the right kidney on the arterial phase images with more streaky parenchymal hypodensities on the venous phase images suggesting infection/pyelonephritis. Minimal stranding of the adjacent perinephric fat. Bladder is unremarkable. Stomach/Bowel: Stomach and small bowel are normal. Appendix is not visualized. Mild fecal retention throughout the colon which is otherwise unremarkable. Lymphatic: No adenopathy. Reproductive: Unremarkable. Other: None.  Musculoskeletal: No focal abnormality. Review of the MIP images confirms the above findings. IMPRESSION: 1. Changes involving the lower pole of the right kidney as described suggesting pyelonephritis. 2. No evidence of aneurysm or dissection involving the thoracoabdominal aorta. Focal narrowing of the abdominal aorta at the bifurcation. 3. Approximate 50% focal stenosis of the origin of the left renal artery. Mild narrowing at the origin of the celiac axis and superior mesenteric artery. 4.  Aortic atherosclerosis. 5. No acute findings in the chest. 6. Punctate nonobstructing right renal stone. Couple small right renal cysts. 7. Mild prominence of the ascending thoracic aorta measuring 3.5 cm. Recommend annual imaging followup by CTA or MRA. This recommendation follows 2010 ACCF/AHA/AATS/ACR/ASA/SCA/SCAI/SIR/STS/SVM Guidelines for the Diagnosis and Management of Patients with Thoracic Aortic Disease. Circulation.2010; 121: E280-K349. Aortic aneurysm NOS (ICD10-I71.9). Aortic Atherosclerosis (ICD10-I70.0). Electronically Signed   By: Marin Olp M.D.   On: 03/14/2020 14:55      Procedures: TEE  Subjective: Patient feeling better, his abdominal pain has been stable, no nausea or vomiting, no chest pain.   Discharge Exam: Vitals:   03/30/20 0935 03/30/20 0955  BP: (!) 143/68 (!) 177/96  Pulse: 84 91  Resp: 13 18  Temp:  97.7 F (36.5 C)  SpO2: 99% 100%   Vitals:   03/30/20 0915 03/30/20 0925 03/30/20 0935 03/30/20 0955  BP: (!) 112/57 123/65 (!) 143/68 (!) 177/96  Pulse: 85 85 84 91  Resp: 15 16 13 18   Temp: (!) 97.4 F (36.3 C)   97.7 F (36.5 C)  TempSrc: Temporal   Oral  SpO2: 100% 100% 99% 100%  Weight:      Height:        General: Not in pain or dyspnea. Neurology: Awake and alert, non focal  E ENT: no pallor, no icterus, oral mucosa moist Cardiovascular: No JVD. S1-S2 present, rhythmic, no gallops, rubs, or murmurs. No lower extremity edema. Pulmonary: positive breath sounds bilaterally, adequate air movement, no wheezing, rhonchi or rales. Gastrointestinal. Abdomen soft. Not tender to superficial palpation  Skin. No rashes Musculoskeletal: no joint deformities   The results of significant diagnostics from this hospitalization (including imaging, microbiology, ancillary and laboratory) are listed below for reference.     Microbiology: Recent Results (from the past 240 hour(s))  Resp Panel by RT-PCR (Flu A&B, Covid) Nasopharyngeal Swab     Status:  None   Collection Time: 03/25/20  2:20 PM   Specimen: Nasopharyngeal Swab; Nasopharyngeal(NP) swabs in vial transport medium  Result Value Ref Range Status   SARS Coronavirus 2 by RT PCR NEGATIVE NEGATIVE Final    Comment: (NOTE) SARS-CoV-2 target nucleic acids are NOT DETECTED.  The SARS-CoV-2 RNA is generally detectable in upper respiratory specimens during the acute phase of infection. The lowest concentration of SARS-CoV-2 viral copies this assay can detect is 138 copies/mL. A negative result does not preclude SARS-Cov-2 infection and should not be used as the sole basis for treatment or other patient management decisions. A negative result may occur with  improper specimen collection/handling, submission of specimen other than nasopharyngeal swab, presence of viral mutation(s) within the areas targeted by this assay, and inadequate number of viral copies(<138 copies/mL). A negative result must be combined with clinical observations, patient history, and epidemiological information. The expected result is Negative.  Fact Sheet for Patients:  EntrepreneurPulse.com.au  Fact Sheet for Healthcare Providers:  IncredibleEmployment.be  This test is no t yet approved or cleared by the [place] and  has been authorized for  detection and/or diagnosis of SARS-CoV-2 by FDA under an Emergency Use Authorization (EUA). This EUA will remain  in effect (meaning this test can be used) for the duration of the COVID-19 declaration under Section 564(b)(1) of the Act, 21 U.S.C.section 360bbb-3(b)(1), unless the authorization is terminated  or revoked sooner.       Influenza A by PCR NEGATIVE NEGATIVE Final   Influenza B by PCR NEGATIVE NEGATIVE Final    Comment: (NOTE) The Xpert Xpress SARS-CoV-2/FLU/RSV plus assay is intended as an aid in the diagnosis of influenza from Nasopharyngeal swab specimens and should not be used as a sole basis for  treatment. Nasal washings and aspirates are unacceptable for Xpert Xpress SARS-CoV-2/FLU/RSV testing.  Fact Sheet for Patients: EntrepreneurPulse.com.au  Fact Sheet for Healthcare Providers: IncredibleEmployment.be  This test is not yet approved or cleared by the [place] FDA and has been authorized for detection and/or diagnosis of SARS-CoV-2 by FDA under an Emergency Use Authorization (EUA). This EUA will remain in effect (meaning this test can be used) for the duration of the COVID-19 declaration under Section 564(b)(1) of the Act, 21 U.S.C. section 360bbb-3(b)(1), unless the authorization is terminated or revoked.  Performed at [facility], [place] [address] [place], [place] [address]   Culture, blood (routine x 2)     Status: None (Preliminary result)   Collection Time: 03/26/20  6:00 PM   Specimen: BLOOD RIGHT ARM  Result Value Ref Range Status   Specimen Description BLOOD RIGHT ARM  Final   Special Requests   Final    BOTTLES DRAWN AEROBIC ONLY Blood Culture adequate volume   Culture   Final    NO GROWTH 4 DAYS Performed at [facility], [place] [address], [place], [place] [address]    Report Status PENDING  Incomplete  Culture, blood (routine x 2)     Status: None (Preliminary result)   Collection Time: 03/26/20  6:05 PM   Specimen: BLOOD RIGHT ARM  Result Value Ref Range Status   Specimen Description BLOOD RIGHT ARM  Final   Special Requests   Final    BOTTLES DRAWN AEROBIC ONLY Blood Culture adequate volume   Culture   Final    NO GROWTH 4 DAYS Performed at [facility], [place] [address], [place], [place] [address]    Report Status PENDING  Incomplete     Labs: BNP (last 3 results) No results for input(s): BNP in the last 8760 hours. Basic Metabolic Panel: Recent Labs  Lab 03/25/20 1603 03/25/20 2315 03/26/20 0135 03/26/20 0446 03/27/20 1505 03/28/20 0244 03/29/20 0333  NA 130*   < >  131* 132* 134* 134* 130*  K 3.9   < > 3.9 3.7 4.4 5.4* 4.2  CL 92*   < > 98 97* 97* 97* 94*  CO2 28   < > 26 27 32 33* 33*  GLUCOSE 365*   < > 121* 115* 150* 335* 348*  BUN 21   < > 18 18 12 21 21   CREATININE 1.00   < > 0.93 0.86 0.99 1.31* 1.20  CALCIUM 8.6*   < > 8.2* 8.3* 8.1* 8.7* 8.4*  MG 2.1  --   --   --  1.9  --   --   PHOS 2.8  --   --   --  3.5  --   --    < > = values in this interval not displayed.   Liver Function Tests: Recent Labs  Lab 03/25/20 1420 03/26/20 0446  AST 36 27  ALT 27 20  ALKPHOS 82 54  BILITOT 0.8 0.6  PROT 8.1 5.9*  ALBUMIN 3.8 2.9*   Recent Labs  Lab 03/25/20 1420  LIPASE 26   No results for input(s): AMMONIA in the last 168 hours. CBC: Recent Labs  Lab 03/25/20 1420 03/25/20 1446 03/26/20 0446 03/27/20 0341  WBC 6.8  --  8.0 8.5  NEUTROABS 5.3  --   --   --   HGB 16.1 17.7* 12.6* 12.2*  HCT 48.6 52.0 38.8* 36.5*  MCV 86.9  --  87.6 86.1  PLT 243  --  192 210   Cardiac Enzymes: No results for input(s): CKTOTAL, CKMB, CKMBINDEX, TROPONINI in the last 168 hours. BNP: Invalid input(s): POCBNP CBG: Recent Labs  Lab 03/29/20 1259 03/29/20 1651 03/29/20 2118 03/30/20 0639 03/30/20 1054  GLUCAP 396* 376* 258* 231* 177*   D-Dimer No results for input(s): DDIMER in the last 72 hours. Hgb A1c No results for input(s): HGBA1C in the last 72 hours. Lipid Profile No results for input(s): CHOL, HDL, LDLCALC, TRIG, CHOLHDL, LDLDIRECT in the last 72 hours. Thyroid function studies No results for input(s): TSH, T4TOTAL, T3FREE, THYROIDAB in the last 72 hours.  Invalid input(s): FREET3 Anemia work up No results for input(s): VITAMINB12, FOLATE, FERRITIN, TIBC, IRON, RETICCTPCT in the last 72 hours. Urinalysis    Component Value Date/Time   COLORURINE STRAW (A) 03/25/2020 1545   APPEARANCEUR CLEAR 03/25/2020 1545   LABSPEC 1.024 03/25/2020 1545   PHURINE 6.0 03/25/2020 1545   GLUCOSEU >=500 (A) 03/25/2020 1545   HGBUR NEGATIVE  03/25/2020 1545   BILIRUBINUR NEGATIVE 03/25/2020 1545   KETONESUR 20 (A) 03/25/2020 1545   PROTEINUR 100 (A) 03/25/2020 1545   NITRITE NEGATIVE 03/25/2020 1545   LEUKOCYTESUR NEGATIVE 03/25/2020 1545   Sepsis Labs Invalid input(s): PROCALCITONIN,  WBC,  LACTICIDVEN Microbiology Recent Results (from the past 240 hour(s))  Resp Panel by RT-PCR (Flu A&B, Covid) Nasopharyngeal Swab     Status: None   Collection Time: 03/25/20  2:20 PM   Specimen: Nasopharyngeal Swab; Nasopharyngeal(NP) swabs in vial transport medium  Result Value Ref Range Status   SARS Coronavirus 2 by RT PCR NEGATIVE NEGATIVE Final    Comment: (NOTE) SARS-CoV-2 target nucleic acids are NOT DETECTED.  The SARS-CoV-2 RNA is generally detectable in upper respiratory specimens during the acute phase of infection. The lowest concentration of SARS-CoV-2 viral copies this assay can detect is 138 copies/mL. A negative result does not preclude SARS-Cov-2 infection and should not be used as the sole basis for treatment or other patient management decisions. A negative result may occur with  improper specimen collection/handling, submission of specimen other than nasopharyngeal swab, presence of viral mutation(s) within the areas targeted by this assay, and inadequate number of viral copies(<138 copies/mL). A negative result must be combined with clinical observations, patient history, and epidemiological information. The expected result is Negative.  Fact Sheet for Patients:  EntrepreneurPulse.com.au  Fact Sheet for Healthcare Providers:  IncredibleEmployment.be  This test is no t yet approved or cleared by the [place] FDA and  has been authorized for detection and/or diagnosis of SARS-CoV-2 by FDA under an Emergency Use Authorization (EUA). This EUA will remain  in effect (meaning this test can be used) for the duration of the COVID-19 declaration under Section 564(b)(1) of the  Act, 21 U.S.C.section 360bbb-3(b)(1), unless the authorization is terminated  or revoked sooner.  Influenza A by PCR NEGATIVE NEGATIVE Final   Influenza B by PCR NEGATIVE NEGATIVE Final    Comment: (NOTE) The Xpert Xpress SARS-CoV-2/FLU/RSV plus assay is intended as an aid in the diagnosis of influenza from Nasopharyngeal swab specimens and should not be used as a sole basis for treatment. Nasal washings and aspirates are unacceptable for Xpert Xpress SARS-CoV-2/FLU/RSV testing.  Fact Sheet for Patients: EntrepreneurPulse.com.au  Fact Sheet for Healthcare Providers: IncredibleEmployment.be  This test is not yet approved or cleared by the [place] FDA and has been authorized for detection and/or diagnosis of SARS-CoV-2 by FDA under an Emergency Use Authorization (EUA). This EUA will remain in effect (meaning this test can be used) for the duration of the COVID-19 declaration under Section 564(b)(1) of the Act, 21 U.S.C. section 360bbb-3(b)(1), unless the authorization is terminated or revoked.  Performed at [facility], [place] [address] [place], [place] [address]   Culture, blood (routine x 2)     Status: None (Preliminary result)   Collection Time: 03/26/20  6:00 PM   Specimen: BLOOD RIGHT ARM  Result Value Ref Range Status   Specimen Description BLOOD RIGHT ARM  Final   Special Requests   Final    BOTTLES DRAWN AEROBIC ONLY Blood Culture adequate volume   Culture   Final    NO GROWTH 4 DAYS Performed at [facility], [place] [address] [place], [place] [address]    Report Status PENDING  Incomplete  Culture, blood (routine x 2)     Status: None (Preliminary result)   Collection Time: 03/26/20  6:05 PM   Specimen: BLOOD RIGHT ARM  Result Value Ref Range Status   Specimen Description BLOOD RIGHT ARM  Final   Special Requests   Final    BOTTLES DRAWN AEROBIC ONLY Blood Culture adequate volume   Culture    Final    NO GROWTH 4 DAYS Performed at [facility], [place] [address] [place], [place] [address]    Report Status PENDING  Incomplete     Time coordinating discharge: 45 minutes  SIGNED:   Tawni Millers, MD  [facility] 03/30/2020, 11:02 AM

## 2020-03-30 NOTE — Interval H&P Note (Signed)
History and Physical Interval Note:  03/30/2020 8:21 AM  William Acevedo  has presented today for surgery, with the diagnosis of STROKE.  The various methods of treatment have been discussed with the patient and family. After consideration of risks, benefits and other options for treatment, the patient has consented to  Procedure(s): TRANSESOPHAGEAL ECHOCARDIOGRAM (TEE) (N/A) as a surgical intervention.  The patient's history has been reviewed, patient examined, no change in status, stable for surgery.  I have reviewed the patient's chart and labs.  Questions were answered to the patient's satisfaction.     [employer]

## 2020-03-30 NOTE — Progress Notes (Signed)
Physical Therapy Treatment Patient Details Name: William Acevedo MRN: [id number] DOB: 1955-03-21 Today's Date: 03/30/2020    History of Present Illness Pt is a 65 yo male admitted to ED on 3/9 with 2-week history of abdominal pain, N/V, R weakness and incoordination. CT abdomen demonstrates possible renal infarct vs pyelonephritis. CT head shows chronic L occipital infarct, while MRI brain shows multifocal acute ischemia within cerebellar hemispheres, R occipital lobe, and bilat frontal lobes concerning for cardioembolic pattern. Pt with elevated troponin concerning for NSTEMI, cardiology following. PMH includes diabetes, current smoker, depression, neuropathy, PSA including cocaine use with history of overdose, encephalopathy.    PT Comments    Pt progressing well towards physical therapy goals. Focus of session was DGI, and pt scored 20/24 indicating pt is not at a high risk for falls. Still feel pt would benefit from light supervision at home for mobility as he was unsteady with higher level balance activity. When discussing home environment, pt indicates he would like a 3-in-1 bedside commode to use as a shower chair. Feel this would be reasonable to decrease risk for falls in the shower. Will continue to follow and progress as able per POC.    Follow Up Recommendations  Outpatient PT;Supervision for mobility/OOB     Equipment Recommendations  3in1 (PT)    Recommendations for Other Services       Precautions / Restrictions Precautions Precautions: Fall (Mod fall risk however fall risk score of 12) Restrictions Weight Bearing Restrictions: No    Mobility  Bed Mobility Overal bed mobility: Independent                  Transfers Overall transfer level: Independent Equipment used: None Transfers: Sit to/from Stand              Ambulation/Gait Ambulation/Gait assistance: Modified independent (Device/Increase time);Supervision Gait Distance (Feet): 500  Feet Assistive device: None Gait Pattern/deviations: Step-through pattern;Decreased stride length;Narrow base of support;Wide base of support;Drifts right/left;Ataxic Gait velocity: Decreased Gait velocity interpretation: 1.31 - 2.62 ft/sec, indicative of limited community ambulator General Gait Details: Light supervision progressing to modified independent by end of gait training. More assist required for balance activity - see below.   Stairs             Wheelchair Mobility    Modified Rankin (Stroke Patients Only) Modified Rankin (Stroke Patients Only) Pre-Morbid Rankin Score: No symptoms Modified Rankin: Moderate disability     Balance Overall balance assessment: Needs assistance;History of Falls Sitting-balance support: No upper extremity supported Sitting balance-Leahy Scale: Fair     Standing balance support: No upper extremity supported;During functional activity Standing balance-Leahy Scale: Fair                   Standardized Balance Assessment Standardized Balance Assessment : Dynamic Gait Index   Dynamic Gait Index Level Surface: Normal Change in Gait Speed: Normal Gait with Horizontal Head Turns: Mild Impairment Gait with Vertical Head Turns: Mild Impairment Gait and Pivot Turn: Normal Step Over Obstacle: Mild Impairment Step Around Obstacles: Normal Steps: Mild Impairment Total Score: 20      Cognition Arousal/Alertness: Awake/alert Behavior During Therapy: WFL for tasks assessed/performed Overall Cognitive Status: Within Functional Limits for tasks assessed                                        Exercises      General Comments  Pertinent Vitals/Pain Pain Assessment: No/denies pain    Home Living                      Prior Function            PT Goals (current goals can now be found in the care plan section) Acute Rehab PT Goals Patient Stated Goal: to go home and get my face back PT Goal  Formulation: With patient Time For Goal Achievement: 04/10/20 Potential to Achieve Goals: Good Progress towards PT goals: Progressing toward goals    Frequency    Min 4X/week      PT Plan Current plan remains appropriate    Co-evaluation              AM-PAC PT "6 Clicks" Mobility   Outcome Measure  Help needed turning from your back to your side while in a flat bed without using bedrails?: None Help needed moving from lying on your back to sitting on the side of a flat bed without using bedrails?: None Help needed moving to and from a bed to a chair (including a wheelchair)?: None Help needed standing up from a chair using your arms (e.g., wheelchair or bedside chair)?: None Help needed to walk in hospital room?: A Little Help needed climbing 3-5 steps with a railing? : A Little 6 Click Score: 22    End of Session Equipment Utilized During Treatment: Gait belt Activity Tolerance: Patient tolerated treatment well;Patient limited by fatigue Patient left: in bed;with call bell/phone within reach;with bed alarm set Nurse Communication: Mobility status PT Visit Diagnosis: Other abnormalities of gait and mobility (R26.89);Difficulty in walking, not elsewhere classified (R26.2)     Time: 1610-9604 PT Time Calculation (min) (ACUTE ONLY): 12 min  Charges:  $Physical Performance Test: 8-22 mins                     Conni Slipper, PT, DPT Acute Rehabilitation Services Pager: [phone] Office: [phone]    Marylynn Pearson 03/30/2020, 2:16 PM

## 2020-03-30 NOTE — Anesthesia Postprocedure Evaluation (Signed)
Anesthesia Post Note  Patient: TYR FRANCA  Procedure(s) Performed: TRANSESOPHAGEAL ECHOCARDIOGRAM (TEE) (N/A ) BUBBLE STUDY     Patient location during evaluation: PACU Anesthesia Type: MAC Level of consciousness: awake and alert and oriented Pain management: pain level controlled Vital Signs Assessment: post-procedure vital signs reviewed and stable Respiratory status: spontaneous breathing, nonlabored ventilation and respiratory function stable Cardiovascular status: blood pressure returned to baseline Postop Assessment: no apparent nausea or vomiting Anesthetic complications: no   No complications documented.  Last Vitals:  Vitals:   03/30/20 0935 03/30/20 0955  BP: (!) 143/68 (!) 177/96  Pulse: 84 91  Resp: 13 18  Temp:  36.5 C  SpO2: 99% 100%    Last Pain:  Vitals:   03/30/20 0955  TempSrc: Oral  PainSc:                  Brennan Bailey

## 2020-03-30 NOTE — Progress Notes (Signed)
STROKE TEAM PROGRESS NOTE   INTERVAL HISTORY Patient is walking around in his room.  TEE small mitral valve calcification versus vegetation.  Small PFO.  No definite clot.  Vitals:   03/30/20 0925 03/30/20 0935 03/30/20 0955 03/30/20 1141  BP: 123/65 (!) 143/68 (!) 177/96 (!) 169/96  Pulse: 85 84 91 92  Resp: 16 13 18 20   Temp:   97.7 F (36.5 C) 98 F (36.7 C)  TempSrc:   Oral Oral  SpO2: 100% 99% 100% 100%  Weight:      Height:       CBC:  Recent Labs  Lab 03/25/20 1420 03/25/20 1446 03/26/20 0446 03/27/20 0341  WBC 6.8  --  8.0 8.5  NEUTROABS 5.3  --   --   --   HGB 16.1   < > 12.6* 12.2*  HCT 48.6   < > 38.8* 36.5*  MCV 86.9  --  87.6 86.1  PLT 243  --  192 210   < > = values in this interval not displayed.   Basic Metabolic Panel:  Recent Labs  Lab 03/25/20 1603 03/25/20 2315 03/27/20 1505 03/28/20 0244 03/29/20 0333  NA 130*   < > 134* 134* 130*  K 3.9   < > 4.4 5.4* 4.2  CL 92*   < > 97* 97* 94*  CO2 28   < > 32 33* 33*  GLUCOSE 365*   < > 150* 335* 348*  BUN 21   < > 12 21 21   CREATININE 1.00   < > 0.99 1.31* 1.20  CALCIUM 8.6*   < > 8.1* 8.7* 8.4*  MG 2.1  --  1.9  --   --   PHOS 2.8  --  3.5  --   --    < > = values in this interval not displayed.   Lipid Panel:  Recent Labs  Lab 03/26/20 0446  CHOL 205*  TRIG 53  HDL 57  CHOLHDL 3.6  VLDL 11  LDLCALC *   HgbA1c:  Recent Labs  Lab 03/26/20 0446  HGBA1C 10.6*   Urine Drug Screen:  Recent Labs  Lab 03/25/20 1545  LABOPIA POSITIVE*  COCAINSCRNUR POSITIVE*  LABBENZ NONE DETECTED  AMPHETMU NONE DETECTED  THCU POSITIVE*  LABBARB NONE DETECTED    Alcohol Level No results for input(s): ETH in the last 168 hours.  IMAGING past 24 hours ECHO TEE  Result Date: 03/30/2020    TRANSESOPHOGEAL ECHO REPORT   Patient Name:   William Acevedo Date of Exam: 03/30/2020 Medical Rec #:  [name]           Height:       66.0 in Accession #:    04/01/2020          Weight:       122.6 lb  Date of Birth:  [birth year]           BSA:          1.624 m Patient Age:    65 years            BP:           188/90 mmHg Patient Gender: M                   HR:           87 bpm. Exam Location:  Inpatient Procedure: Transesophageal Echo, 3D Echo, Cardiac Doppler, Color Doppler and  Saline Contrast Bubble Study Indications:     Stroke  History:         Patient has prior history of Echocardiogram examinations, most                  recent 03/26/2020. CHF; Risk Factors:Hypertension and Diabetes.  Sonographer:     Eulah PontSarah Pirrotta RDCS Referring Phys:  [phone] JILL D MCDANIEL Diagnosing Phys: Donato SchultzMark Skains MD PROCEDURE: After discussion of the risks and benefits of a TEE, an informed consent was obtained from the patient. The transesophogeal probe was passed without difficulty through the esophogus of the patient. Sedation performed by different physician. The patient was monitored while under deep sedation. Anesthestetic sedation was provided intravenously by Anesthesiology: 194.83mg  of Propofol, 80mg  of Lidocaine. The patient's vital signs; including heart rate, blood pressure, and oxygen saturation; remained stable throughout the procedure. The patient developed no complications during the procedure. IMPRESSIONS  1. Left ventricular ejection fraction, by estimation, is 60 to 65%. The left ventricle has normal function. The left ventricle has no regional wall motion abnormalities. There is mild left ventricular hypertrophy.  2. Right ventricular systolic function is normal. The right ventricular size is normal.  3. Left atrial size was mildly dilated. No left atrial/left atrial appendage thrombus was detected.  4. There is a mobile echodensity measuring 0.6cm on the atrial surface attached to the mitral annular calcification. Can not exclude vegetation/ healed calcified vegetation. The mitral valve is normal in structure. Mild to moderate mitral valve regurgitation. No evidence of mitral stenosis.  5. The tricuspid  valve is degenerative.  6. The aortic valve is normal in structure. Aortic valve regurgitation is not visualized. No aortic stenosis is present.  7. The inferior vena cava is normal in size with greater than 50% respiratory variability, suggesting right atrial pressure of 3 mmHg.  8. Positive late bubble cross over with Valsalva/pressure on abdomen. Conclusion(s)/Recommendation(s): Findings are concerning for vegetation/infective endocarditis as detailed above. Positive late bubble cross over with Valsalva/pressure on abdomen suggestive of small PFO.  FINDINGS  Left Ventricle: Left ventricular ejection fraction, by estimation, is 60 to 65%. The left ventricle has normal function. The left ventricle has no regional wall motion abnormalities. The left ventricular internal cavity size was normal in size. There is  mild left ventricular hypertrophy. Right Ventricle: The right ventricular size is normal. No increase in right ventricular wall thickness. Right ventricular systolic function is normal. Left Atrium: Left atrial size was mildly dilated. No left atrial/left atrial appendage thrombus was detected. Right Atrium: Right atrial size was normal in size. Pericardium: There is no evidence of pericardial effusion. Mitral Valve: There is a mobile echodensity measuring 0.6cm on the atrial surface attached to the mitral annular calcification. Can not exclude vegetation/ healed calcified vegetation. The mitral valve is normal in structure. There is moderate thickening  of the mitral valve leaflet(s). Mild to moderate mitral valve regurgitation. No evidence of mitral valve stenosis. Tricuspid Valve: The tricuspid valve is degenerative in appearance. Tricuspid valve regurgitation is not demonstrated. No evidence of tricuspid stenosis. Aortic Valve: The aortic valve is normal in structure. Aortic valve regurgitation is not visualized. No aortic stenosis is present. Pulmonic Valve: The pulmonic valve was normal in structure.  Pulmonic valve regurgitation is not visualized. No evidence of pulmonic stenosis. Aorta: The aortic root is normal in size and structure. Venous: The inferior vena cava is normal in size with greater than 50% respiratory variability, suggesting right atrial pressure of 3 mmHg. IAS/Shunts: No  atrial level shunt detected by color flow Doppler. Agitated saline contrast was given intravenously to evaluate for intracardiac shunting. Positive late bubble cross over with Valsalva/pressure on abdomen. Donato Schultz MD Electronically signed by Donato Schultz MD Signature Date/Time: 03/30/2020/1:01:29 PM    Final     Prior CT abdomen pelvis February 2022 concerning for some degree of right kidney lower pole infarction.-Versus pyelonephritis.  PHYSICAL EXAM  Temp:  [97.4 F (36.3 C)-98.4 F (36.9 C)] 98 F (36.7 C) (03/14 1141) Pulse Rate:  [84-102] 92 (03/14 1141) Resp:  [13-20] 20 (03/14 1141) BP: (112-191)/(30-96) 169/96 (03/14 1141) SpO2:  [98 %-100 %] 100 % (03/14 1141) Weight:  [55.6 kg] 55.6 kg (03/14 0758)  General - Thin, well developed middle-aged African-American male Cardiovascular - Regular rhythm and rate showing on tele  Mental Status -  Alert, oriented x4.   Language including expression, naming, repetition, comprehension was assessed and found intact.  Attention span and concentration were normal.  Recent and remote memory were intact.   Cranial Nerves II - XII - II - Visual field intact OU. III, IV, VI - Extraocular movements intact.  V - Facial sensation impaired d/t reported old injury with numbness from left forehead to left mid cheek.  VII - Facial movement intact bilaterally.  VIII - Hearing intact to voice.  X - Palate elevates symmetrically.  XI - Chin turning & shoulder shrug intact bilaterally.  XII - Tongue protrusion intact.   Motor Strength - Mild weakness in R grip 4+, otherwise 5/5 RUE. RLE is full strength. L hemibody is full strength. No pronator drift.  Motor  Tone - Muscle tone was normal. Sensory - Entire right hand with decreased sensation from wrist to fingertips.  Coordination -  FTN and HTS intact  Gait steady without any ataxia  Assessment and plan   Stroke: Multifocal small or punctate acute and subacute infarcts involving right cerebellum, right ACA and MCA/PCA and b/l MCA/ACA regions, no hemorrhagic conversion. Definitely cardioembolic pattern. Along with his renal infarct, rule out LV thrombus, endocarditis, or RV thrombus with PFO in the setting of cocaine vasculopathy..    CT head code Stroke :Findings suggestive of a small, stable chronic left occipital lobe infarct. MRI correlation is recommended.  CTA  head & neck shows no large vessel stenosis or occlusion.  Greater than 50% stenosis of proximal cavernous segment of left ICA and 50% stenosis of the mid cavernous right ICA.  Brain MRI done showed multifocal small or punctate acute and subacute infarcts involving right cerebellum, right ACA and MCA/PCA and b/l MCA/ACA regions, no hemorrhagic conversion.  2D Echo: EF 65-70% No thrombus, wall motion abnormality or shunt found. +mild LVH  VTE prophylaxis - on heparin gtt    Diet   Diet Carb Modified Fluid consistency: Thin; Room service appropriate? Yes    Given the small size of stroke and subacute appearance, OK with heparin IV and ASA 81mg    No AC/AP prior to admission  Would recommend TEE   Obtain blood c/s  Therapy recommendations:  TBD  Disposition:  TBD  Hypertension Renal artery stenosis . No need for permissive hypertension as symptoms are at least 33 days old, maybe longer  . Long-term BP goal normotensive  Hyperlipidemia  Home meds:  Lipitor 20mg    LDL 137, goal < 70  High intensity statin: Increase Lipitor dose to 40mg    Continue statin at discharge  Diabetes type II Uncontrolled with HHNC  Management per primary team  HgbA1c 10.6,  goal < 7.0  CBGs Recent Labs    03/29/20 2118  03/30/20 0639 03/30/20 1054  GLUCAP 258* 231* 177*      SSI  Close PCP  follow up for continued management  NSTEMI:   Management per cardiology/primary team  On heparin gtt  EKG shows right axis deviation.   On ASA 81mg   Elevated troponins  Hyponatremia Na 130->130_>131->132 -May be chronic in settting of ETOH abuse. ED/hospital v visits on file for the past two years show mulitple low Na values (127-134)  Other Stroke Risk Factors  Advanced Age >/= 74   Cigarette, crack cocaine and marijuana smoker.   ETOH abuse:chronic drink every day 6 or more beers  Substance abuse - UDS:  THC POSITIVE, Cocaine POSITIVE. Patient advised to stop using due to stroke risk.  ?coronary artery disease   Other Active Problems  Polysubstance Abuse -Currently denying withdrawal s/s -Consider [facility] day # 5  Patient is doing well and appears neurologically stable. TEE shows small mitral valve vegetation versus calcification.  He has no clinical signs of active endocarditis.  Recommend check lower extremity venous Dopplers for PFO if negative can be discharged on antiplatelet therapy positive may need_ Discussed with Dr.Arien Call for questions.  Greater than 50% time in the preventive visit with [facility], MD  -- Delia Heady, MD Stroke Neurology Pager: [phone]

## 2020-03-30 NOTE — Progress Notes (Signed)
Lower extremity venous bilateral study completed.   Please see CV Proc for preliminary results.   Nishika Parkhurst, RDMS, RVT  

## 2020-03-30 NOTE — CV Procedure (Signed)
   Transesophageal Echocardiogram  Indications: Stroke  Time out performed  Propofol with anesthesia  Findings:  Left Ventricle: EF 65 to 70%.  Mild LVH.  No LV thrombus.  Mitral Valve: Moderate mitral annular calcification (MAC) noted.  There is a small mobile echodensity attached to the mitral annular calcification that is chaotic in motion within the left atrial surface, calcified.  Cannot exclude small vegetation.  Could be degeneration from MAC as well.  Aortic Valve: Normal  Tricuspid Valve: Normal, mild TR  Left Atrium: No left atrial appendage thrombus  Bubble Contrast Study: Positive late bubble study with Valsalva/pushing on abdomen.  Small bubble crossover.  Impression: Possible small vegetation attached to mitral annular calcification versus degeneration.  Mildly positive bubble study with Valsalva.  Candee Furbish, MD

## 2020-03-30 NOTE — Progress Notes (Signed)
PT Cancellation Note  Patient Details Name: William Acevedo MRN: [id number] DOB: [birth year]   Cancelled Treatment:    Reason Eval/Treat Not Completed: Patient at procedure or test/unavailable. Pt currently off unit for TEE. Will check back as schedule allows to continue with PT POC.   Marylynn Pearson 03/30/2020, 8:24 AM   Conni Slipper, PT, DPT Acute Rehabilitation Services Pager: [phone] Office: [phone]

## 2020-03-30 NOTE — TOC Transition Note (Signed)
Transition of Care [facility]) - CM/SW Discharge Note   Patient Details  Name: William Acevedo MRN: [id number] Date of Birth: 12/17/1955  [facility] CM/SW Contact:  Kermit Balo, RN Phone Number: 03/30/2020, 4:36 PM   Clinical Narrative:    Pt discharging home with outpatient therapy. Pt would like to use his [facility] benefits to cover the outpatient therapy. CM has sent needed information to his [facility] PCP. Orders  In Epic and information on the AVS. Pt has transportation home.   Final next level of care: OP Rehab Barriers to Discharge: No Barriers Identified   Patient Goals and CMS Choice     Choice offered to / list presented to : Patient  Discharge Placement                       Discharge Plan and Services                                     Social Determinants of Health (SDOH) Interventions     Readmission Risk Interventions No flowsheet data found.

## 2020-03-30 NOTE — Anesthesia Preprocedure Evaluation (Addendum)
Anesthesia Evaluation  Patient identified by MRN, date of birth, ID band Patient awake    Reviewed: Allergy & Precautions, NPO status , Patient's Chart, lab work & pertinent test results  History of Anesthesia Complications Negative for: history of anesthetic complications  Airway Mallampati: II  TM Distance: >3 FB Neck ROM: Full    Dental  (+) Edentulous Upper, Missing,    Pulmonary Current Smoker and Patient abstained from smoking.,    Pulmonary exam normal        Cardiovascular + Past MI  Normal cardiovascular exam  TTE 03/26/20: EF 65-70%, mild LVH, mild MR   Neuro/Psych Depression CVA    GI/Hepatic (+)     substance abuse  alcohol use, cocaine use and marijuana use,   Endo/Other  diabetes, Type 2, Oral Hypoglycemic Agents, Insulin Dependent  Renal/GU   negative genitourinary   Musculoskeletal negative musculoskeletal ROS (+)   Abdominal   Peds  Hematology negative hematology ROS (+)   Anesthesia Other Findings Day of surgery medications reviewed with patient.  Reproductive/Obstetrics negative OB ROS                           Anesthesia Physical Anesthesia Plan  ASA: IV  Anesthesia Plan: MAC   Post-op Pain Management:    Induction:   PONV Risk Score and Plan: Treatment may vary due to age or medical condition and Propofol infusion  Airway Management Planned: Natural Airway and Nasal Cannula  Additional Equipment: None  Intra-op Plan:   Post-operative Plan:   Informed Consent: I have reviewed the patients History and Physical, chart, labs and discussed the procedure including the risks, benefits and alternatives for the proposed anesthesia with the patient or authorized representative who has indicated his/her understanding and acceptance.     Dental advisory given  Plan Discussed with: CRNA  Anesthesia Plan Comments:        Anesthesia Quick Evaluation

## 2020-03-30 NOTE — Progress Notes (Signed)
Inpatient Diabetes Program Recommendations  AACE/ADA: New Consensus Statement on Inpatient Glycemic Control (2015)  Target Ranges:  Prepandial:   less than 140 mg/dL      Peak postprandial:   less than 180 mg/dL (1-2 hours)      Critically ill patients:  140 - 180 mg/dL   Lab Results  Component Value Date   GLUCAP 231 (H) 03/30/2020   HGBA1C 10.6 (H) 03/26/2020    Review of Glycemic Control Results for William Acevedo, William Acevedo (MRN [id number]) as of 03/30/2020 09:35  Ref. Range 03/29/2020 06:24 03/29/2020 12:59 03/29/2020 16:51 03/29/2020 21:18 03/30/2020 06:39  Glucose-Capillary Latest Ref Range: 70 - 99 mg/dL 818 (H) 299 (H) 371 (H) 258 (H) 231 (H)   Diabetes history: DM 2 Outpatient Diabetes medications: Lantus 40 units qd, Novolog 5 units tid, Glucotrol 10 mg Daily, jardiance 25 mg Daily Current orders for Inpatient glycemic control:  Lantus 20 units qhs Novolog 0-9 units tid + hs  Ensure enlive tid between meals  Inpatient Diabetes Program Recommendations:   Labile glucose trends  -  Add 4 units tid Novolog meal coverage if pt eats >50% of meals.  Thanks,  Christena Deem RN, MSN, BC-ADM Inpatient Diabetes Coordinator Team Pager [phone] (8a-5p)

## 2020-03-30 NOTE — Transfer of Care (Signed)
Immediate Anesthesia Transfer of Care Note  Patient: William Acevedo  Procedure(s) Performed: TRANSESOPHAGEAL ECHOCARDIOGRAM (TEE) (N/A )  Patient Location: Endoscopy Unit  Anesthesia Type:MAC  Level of Consciousness: awake, alert  and sedated  Airway & Oxygen Therapy: Patient connected to nasal cannula oxygen  Post-op Assessment: Post -op Vital signs reviewed and stable  Post vital signs: stable  Last Vitals:  Vitals Value Taken Time  BP    Temp    Pulse    Resp    SpO2      Last Pain:  Vitals:   03/30/20 0758  TempSrc: Temporal  PainSc: 8       Patients Stated Pain Goal: 3 (55/73/22 0254)  Complications: No complications documented.

## 2020-03-30 NOTE — Progress Notes (Signed)
  Echocardiogram Echocardiogram Transesophageal has been performed.  William Acevedo 03/30/2020, 9:24 AM

## 2020-03-31 ENCOUNTER — Encounter (HOSPITAL_COMMUNITY): Payer: Self-pay | Admitting: Cardiology

## 2020-03-31 LAB — CULTURE, BLOOD (ROUTINE X 2)
Culture: NO GROWTH
Culture: NO GROWTH
Special Requests: ADEQUATE
Special Requests: ADEQUATE

## 2020-04-02 ENCOUNTER — Emergency Department (HOSPITAL_COMMUNITY): Payer: No Typology Code available for payment source

## 2020-04-02 ENCOUNTER — Encounter (HOSPITAL_COMMUNITY): Payer: Self-pay | Admitting: Radiology

## 2020-04-02 ENCOUNTER — Inpatient Hospital Stay (HOSPITAL_COMMUNITY)
Admission: EM | Admit: 2020-04-02 | Discharge: 2020-04-05 | DRG: 392 | Disposition: A | Discharge status: Home / Self Care | Payer: No Typology Code available for payment source | Attending: Family Medicine | Admitting: Family Medicine

## 2020-04-02 DIAGNOSIS — Z888 Allergy status to other drugs, medicaments and biological substances status: Secondary | ICD-10-CM

## 2020-04-02 DIAGNOSIS — N419 Inflammatory disease of prostate, unspecified: Secondary | ICD-10-CM | POA: Diagnosis present

## 2020-04-02 DIAGNOSIS — F1210 Cannabis abuse, uncomplicated: Secondary | ICD-10-CM | POA: Diagnosis present

## 2020-04-02 DIAGNOSIS — I701 Atherosclerosis of renal artery: Secondary | ICD-10-CM | POA: Diagnosis present

## 2020-04-02 DIAGNOSIS — Z7982 Long term (current) use of aspirin: Secondary | ICD-10-CM

## 2020-04-02 DIAGNOSIS — R197 Diarrhea, unspecified: Secondary | ICD-10-CM | POA: Diagnosis present

## 2020-04-02 DIAGNOSIS — G629 Polyneuropathy, unspecified: Secondary | ICD-10-CM | POA: Diagnosis present

## 2020-04-02 DIAGNOSIS — R1032 Left lower quadrant pain: Secondary | ICD-10-CM

## 2020-04-02 DIAGNOSIS — E1165 Type 2 diabetes mellitus with hyperglycemia: Secondary | ICD-10-CM | POA: Diagnosis present

## 2020-04-02 DIAGNOSIS — G8929 Other chronic pain: Secondary | ICD-10-CM | POA: Diagnosis present

## 2020-04-02 DIAGNOSIS — Z9079 Acquired absence of other genital organ(s): Secondary | ICD-10-CM

## 2020-04-02 DIAGNOSIS — R300 Dysuria: Secondary | ICD-10-CM | POA: Diagnosis present

## 2020-04-02 DIAGNOSIS — Q211 Atrial septal defect: Secondary | ICD-10-CM

## 2020-04-02 DIAGNOSIS — R112 Nausea with vomiting, unspecified: Secondary | ICD-10-CM | POA: Diagnosis not present

## 2020-04-02 DIAGNOSIS — R109 Unspecified abdominal pain: Secondary | ICD-10-CM | POA: Diagnosis present

## 2020-04-02 DIAGNOSIS — Z79899 Other long term (current) drug therapy: Secondary | ICD-10-CM

## 2020-04-02 DIAGNOSIS — R1084 Generalized abdominal pain: Secondary | ICD-10-CM | POA: Diagnosis not present

## 2020-04-02 DIAGNOSIS — E785 Hyperlipidemia, unspecified: Secondary | ICD-10-CM | POA: Diagnosis present

## 2020-04-02 DIAGNOSIS — Z20822 Contact with and (suspected) exposure to covid-19: Secondary | ICD-10-CM | POA: Diagnosis present

## 2020-04-02 DIAGNOSIS — K219 Gastro-esophageal reflux disease without esophagitis: Secondary | ICD-10-CM | POA: Diagnosis not present

## 2020-04-02 DIAGNOSIS — F17210 Nicotine dependence, cigarettes, uncomplicated: Secondary | ICD-10-CM | POA: Diagnosis present

## 2020-04-02 DIAGNOSIS — R7989 Other specified abnormal findings of blood chemistry: Secondary | ICD-10-CM | POA: Diagnosis present

## 2020-04-02 DIAGNOSIS — Z8673 Personal history of transient ischemic attack (TIA), and cerebral infarction without residual deficits: Secondary | ICD-10-CM | POA: Diagnosis not present

## 2020-04-02 DIAGNOSIS — I150 Renovascular hypertension: Secondary | ICD-10-CM | POA: Diagnosis present

## 2020-04-02 DIAGNOSIS — F1410 Cocaine abuse, uncomplicated: Secondary | ICD-10-CM | POA: Diagnosis present

## 2020-04-02 DIAGNOSIS — K5900 Constipation, unspecified: Secondary | ICD-10-CM | POA: Diagnosis present

## 2020-04-02 LAB — COMPREHENSIVE METABOLIC PANEL
ALT: 32 U/L (ref 0–44)
AST: 33 U/L (ref 15–41)
Albumin: 3.8 g/dL (ref 3.5–5.0)
Alkaline Phosphatase: 59 U/L (ref 38–126)
Anion gap: 12 (ref 5–15)
BUN: 13 mg/dL (ref 8–23)
CO2: 29 mmol/L (ref 22–32)
Calcium: 9.2 mg/dL (ref 8.9–10.3)
Chloride: 94 mmol/L — ABNORMAL LOW (ref 98–111)
Creatinine, Ser: 0.83 mg/dL (ref 0.61–1.24)
GFR, Estimated: >60 mL/min (ref >60)
Glucose, Bld: 245 mg/dL — ABNORMAL HIGH (ref 70–99)
Potassium: 4.1 mmol/L (ref 3.5–5.1)
Sodium: 135 mmol/L (ref 135–145)
Total Bilirubin: 0.5 mg/dL (ref 0.3–1.2)
Total Protein: 7.9 g/dL (ref 6.5–8.1)

## 2020-04-02 LAB — URINALYSIS, ROUTINE W REFLEX MICROSCOPIC
Bilirubin Urine: NEGATIVE
Glucose, UA: >=500 mg/dL — AB
Hgb urine dipstick: NEGATIVE
Ketones, ur: NEGATIVE mg/dL
Leukocytes,Ua: NEGATIVE
Nitrite: NEGATIVE
Protein, ur: 100 mg/dL — AB
Specific Gravity, Urine: 1.014 (ref 1.005–1.030)
pH: 9 — ABNORMAL HIGH (ref 5.0–8.0)

## 2020-04-02 LAB — CBC WITH DIFFERENTIAL/PLATELET
Abs Immature Granulocytes: 0.04 10*3/uL (ref 0.00–0.07)
Basophils Absolute: 0 10*3/uL (ref 0.0–0.1)
Basophils Relative: 0 %
Eosinophils Absolute: 0 10*3/uL (ref 0.0–0.5)
Eosinophils Relative: 0 %
HCT: 42.4 % (ref 39.0–52.0)
Hemoglobin: 13.6 g/dL (ref 13.0–17.0)
Immature Granulocytes: 1 %
Lymphocytes Relative: 18 %
Lymphs Abs: 1.2 10*3/uL (ref 0.7–4.0)
MCH: 28.3 pg (ref 26.0–34.0)
MCHC: 32.1 g/dL (ref 30.0–36.0)
MCV: 88.3 fL (ref 80.0–100.0)
Monocytes Absolute: 0.4 10*3/uL (ref 0.1–1.0)
Monocytes Relative: 7 %
Neutro Abs: 5 10*3/uL (ref 1.7–7.7)
Neutrophils Relative %: 74 %
Platelets: 369 10*3/uL (ref 150–400)
RBC: 4.8 MIL/uL (ref 4.22–5.81)
RDW: 12.4 % (ref 11.5–15.5)
WBC: 6.6 10*3/uL (ref 4.0–10.5)
nRBC: 0 % (ref 0.0–0.2)

## 2020-04-02 LAB — RAPID URINE DRUG SCREEN, HOSP PERFORMED
Amphetamines: NOT DETECTED
Barbiturates: NOT DETECTED
Benzodiazepines: NOT DETECTED
Cocaine: NOT DETECTED
Opiates: POSITIVE — AB
Tetrahydrocannabinol: NOT DETECTED

## 2020-04-02 LAB — LIPASE, BLOOD: Lipase: 23 U/L (ref 11–51)

## 2020-04-02 LAB — TROPONIN I (HIGH SENSITIVITY)
Troponin I (High Sensitivity): 194 ng/L (ref <18)
Troponin I (High Sensitivity): 211 ng/L (ref <18)

## 2020-04-02 LAB — RESP PANEL BY RT-PCR (FLU A&B, COVID) ARPGX2
Influenza A by PCR: NEGATIVE
Influenza B by PCR: NEGATIVE
SARS Coronavirus 2 by RT PCR: NEGATIVE

## 2020-04-02 LAB — LACTIC ACID, PLASMA
Lactic Acid, Venous: 2.1 mmol/L (ref 0.5–1.9)
Lactic Acid, Venous: 2.1 mmol/L (ref 0.5–1.9)

## 2020-04-02 LAB — GLUCOSE, CAPILLARY: Glucose-Capillary: 252 mg/dL — ABNORMAL HIGH (ref 70–99)

## 2020-04-02 LAB — ETHANOL: Alcohol, Ethyl (B): <10 mg/dL (ref <10)

## 2020-04-02 LAB — SALICYLATE LEVEL: Salicylate Lvl: <7 mg/dL — ABNORMAL LOW (ref 7.0–30.0)

## 2020-04-02 LAB — ACETAMINOPHEN LEVEL: Acetaminophen (Tylenol), Serum: <10 ug/mL — ABNORMAL LOW (ref 10–30)

## 2020-04-02 MED ORDER — INSULIN GLARGINE 100 UNIT/ML SOLOSTAR PEN: 15.0000 [IU] | PEN_INJECTOR | Freq: Every day | SUBCUTANEOUS | Status: DC

## 2020-04-02 MED ORDER — SENNA 8.6 MG PO TABS
1.0000 | ORAL_TABLET | Freq: Every day | ORAL | Status: DC
  Administered 2020-04-03 – 2020-04-05 (×3): 8.6 mg via ORAL
  Filled 2020-04-02 (×3): qty 1

## 2020-04-02 MED ORDER — SODIUM CHLORIDE 0.9 % IV BOLUS
500.0000 mL | Freq: Once | INTRAVENOUS | Status: AC
  Administered 2020-04-02: 500 mL via INTRAVENOUS

## 2020-04-02 MED ORDER — INSULIN ASPART 100 UNIT/ML ~~LOC~~ SOLN
0.0000 [IU] | Freq: Every day | SUBCUTANEOUS | Status: DC
  Administered 2020-04-02: 3 [IU] via SUBCUTANEOUS
  Administered 2020-04-03: 5 [IU] via SUBCUTANEOUS
  Administered 2020-04-04: 4 [IU] via SUBCUTANEOUS
  Filled 2020-04-02: qty 0.05

## 2020-04-02 MED ORDER — PROCHLORPERAZINE EDISYLATE 10 MG/2ML IJ SOLN
10.0000 mg | Freq: Four times a day (QID) | INTRAMUSCULAR | Status: DC | PRN
  Administered 2020-04-03: 10 mg via INTRAVENOUS
  Filled 2020-04-02 (×3): qty 2

## 2020-04-02 MED ORDER — ASPIRIN EC 81 MG PO TBEC
81.0000 mg | DELAYED_RELEASE_TABLET | Freq: Every day | ORAL | Status: DC
  Administered 2020-04-02 – 2020-04-05 (×4): 81 mg via ORAL
  Filled 2020-04-02 (×4): qty 1

## 2020-04-02 MED ORDER — ACETAMINOPHEN 650 MG RE SUPP: 650.0000 mg | Freq: Four times a day (QID) | RECTAL | Status: DC | PRN

## 2020-04-02 MED ORDER — INSULIN GLARGINE 100 UNIT/ML ~~LOC~~ SOLN
15.0000 [IU] | Freq: Every day | SUBCUTANEOUS | Status: DC
  Administered 2020-04-03 – 2020-04-04 (×3): 15 [IU] via SUBCUTANEOUS
  Filled 2020-04-02 (×3): qty 0.15

## 2020-04-02 MED ORDER — ACETAMINOPHEN 325 MG PO TABS
650.0000 mg | ORAL_TABLET | Freq: Four times a day (QID) | ORAL | Status: DC | PRN
  Administered 2020-04-04 – 2020-04-05 (×2): 650 mg via ORAL
  Filled 2020-04-02 (×2): qty 2

## 2020-04-02 MED ORDER — ATORVASTATIN CALCIUM 20 MG PO TABS
20.0000 mg | ORAL_TABLET | Freq: Every day | ORAL | Status: DC
  Administered 2020-04-02 – 2020-04-05 (×4): 20 mg via ORAL
  Filled 2020-04-02 (×3): qty 1
  Filled 2020-04-02: qty 2

## 2020-04-02 MED ORDER — ONDANSETRON HCL 4 MG/2ML IJ SOLN
4.0000 mg | Freq: Once | INTRAMUSCULAR | Status: AC
  Administered 2020-04-02: 4 mg via INTRAVENOUS
  Filled 2020-04-02: qty 2

## 2020-04-02 MED ORDER — FAMOTIDINE 20 MG PO TABS
20.0000 mg | ORAL_TABLET | Freq: Every day | ORAL | Status: DC
  Administered 2020-04-02 – 2020-04-05 (×4): 20 mg via ORAL
  Filled 2020-04-02 (×4): qty 1

## 2020-04-02 MED ORDER — DICYCLOMINE HCL 20 MG PO TABS
20.0000 mg | ORAL_TABLET | Freq: Three times a day (TID) | ORAL | Status: DC
  Administered 2020-04-03 – 2020-04-05 (×7): 20 mg via ORAL
  Filled 2020-04-02 (×9): qty 1

## 2020-04-02 MED ORDER — SODIUM CHLORIDE 0.9 % IV SOLN: Freq: Once | INTRAVENOUS | Status: AC

## 2020-04-02 MED ORDER — METOPROLOL TARTRATE 50 MG PO TABS
50.0000 mg | ORAL_TABLET | Freq: Two times a day (BID) | ORAL | Status: DC
  Administered 2020-04-03 – 2020-04-05 (×4): 50 mg via ORAL
  Filled 2020-04-02 (×5): qty 1

## 2020-04-02 MED ORDER — HYDROCODONE-ACETAMINOPHEN 5-325 MG PO TABS
1.0000 | ORAL_TABLET | Freq: Four times a day (QID) | ORAL | Status: DC | PRN
  Administered 2020-04-02 – 2020-04-05 (×9): 1 via ORAL
  Filled 2020-04-02 (×9): qty 1

## 2020-04-02 MED ORDER — CYCLOBENZAPRINE HCL 5 MG PO TABS
5.0000 mg | ORAL_TABLET | Freq: Three times a day (TID) | ORAL | Status: DC | PRN
  Administered 2020-04-02 – 2020-04-05 (×3): 5 mg via ORAL
  Filled 2020-04-02 (×3): qty 1

## 2020-04-02 MED ORDER — LORAZEPAM 2 MG/ML IJ SOLN
0.5000 mg | Freq: Once | INTRAMUSCULAR | Status: AC
  Administered 2020-04-02: 0.5 mg via INTRAVENOUS
  Filled 2020-04-02: qty 1

## 2020-04-02 MED ORDER — MELATONIN 3 MG PO TABS
3.0000 mg | ORAL_TABLET | Freq: Every day | ORAL | Status: DC
  Administered 2020-04-03 – 2020-04-04 (×2): 3 mg via ORAL
  Filled 2020-04-02 (×3): qty 1

## 2020-04-02 MED ORDER — INSULIN ASPART 100 UNIT/ML ~~LOC~~ SOLN
0.0000 [IU] | Freq: Three times a day (TID) | SUBCUTANEOUS | Status: DC
  Administered 2020-04-03 – 2020-04-04 (×3): 2 [IU] via SUBCUTANEOUS
  Administered 2020-04-04: 7 [IU] via SUBCUTANEOUS
  Administered 2020-04-04: 3 [IU] via SUBCUTANEOUS
  Administered 2020-04-05: 2 [IU] via SUBCUTANEOUS
  Filled 2020-04-02: qty 0.09

## 2020-04-02 MED ORDER — HYDRALAZINE HCL 20 MG/ML IJ SOLN: 10.0000 mg | Freq: Three times a day (TID) | INTRAMUSCULAR | Status: DC | PRN

## 2020-04-02 MED ORDER — METOPROLOL TARTRATE 5 MG/5ML IV SOLN: 5.0000 mg | Freq: Once | INTRAVENOUS | Status: DC

## 2020-04-02 MED ORDER — AMITRIPTYLINE HCL 10 MG PO TABS
20.0000 mg | ORAL_TABLET | Freq: Every day | ORAL | Status: DC
  Administered 2020-04-02 – 2020-04-04 (×2): 20 mg via ORAL
  Filled 2020-04-02 (×3): qty 2

## 2020-04-02 MED ORDER — DOCUSATE SODIUM 100 MG PO CAPS
100.0000 mg | ORAL_CAPSULE | Freq: Two times a day (BID) | ORAL | Status: DC
  Administered 2020-04-02 – 2020-04-05 (×5): 100 mg via ORAL
  Filled 2020-04-02 (×6): qty 1

## 2020-04-02 MED ORDER — IOHEXOL 300 MG/ML  SOLN
100.0000 mL | Freq: Once | INTRAMUSCULAR | Status: AC | PRN
  Administered 2020-04-02: 100 mL via INTRAVENOUS

## 2020-04-02 MED ORDER — POLYETHYLENE GLYCOL 3350 17 G PO PACK: 17.0000 g | PACK | Freq: Every day | ORAL | Status: DC | PRN

## 2020-04-02 MED ORDER — METOPROLOL TARTRATE 25 MG PO TABS
50.0000 mg | ORAL_TABLET | Freq: Once | ORAL | Status: AC
  Administered 2020-04-02: 50 mg via ORAL
  Filled 2020-04-02: qty 2

## 2020-04-02 MED ORDER — MORPHINE SULFATE (PF) 4 MG/ML IV SOLN
4.0000 mg | Freq: Once | INTRAVENOUS | Status: AC
Start: 2020-04-02 — End: 2020-04-02
  Administered 2020-04-02: 4 mg via INTRAVENOUS
  Filled 2020-04-02: qty 1

## 2020-04-02 NOTE — H&P (Addendum)
History and Physical    RIHAAN BARRACK RUE:[id number] DOB: [birth year] DOA: 04/02/2020  PCP: Olegario Messier, MD  Patient coming from: Home  Chief Complaint: abdominal pain  HPI: William Acevedo is a 65 y.o. male with medical history significant of CVA, cocaine abuse, HTN, DM2. Presenting with abdominal pain. He's had multiple visit for the same symptoms over the past several week. Pain started back up at 4 am. It is located in the LLQ. It is sharp and tight. Worsen with BM. It is associated with N/V. Tried previous pain medicines but didn't help. When he didn't find relief, he decided to come to ED. He denies any other aggravating or alleviating factors.   ED Course: CT ab/pelvis was negative. He continued to complain of N/V/ab pain. He was given zofran, ativan, morphine. He pain did not improve. TRH was called for admission.   Review of Systems:  Denies CP, dyspnea, palpitations, lightheadedness dizziness, hematemesis, hematochezia. Reports N/V/ab pain. Review of systems is otherwise negative for all not mentioned in HPI.   PMHx Past Medical History:  Diagnosis Date  . Carpal tunnel syndrome   . Depression   . Diabetes mellitus without complication ([place])   . Neuropathy     PSHx Past Surgical History:  Procedure Laterality Date  . BUBBLE STUDY  03/30/2020   Procedure: BUBBLE STUDY;  Surgeon: Jerline Pain, MD;  Location: [facility];  Service: Cardiovascular;;  . TEE WITHOUT CARDIOVERSION N/A 03/30/2020   Procedure: TRANSESOPHAGEAL ECHOCARDIOGRAM (TEE);  Surgeon: Jerline Pain, MD;  Location: [facility] ENDOSCOPY;  Service: Cardiovascular;  Laterality: N/A;  . TESTICLE TORSION REDUCTION      SocHx  reports that he has been smoking cigarettes. He has been smoking about 0.50 packs per day. He has never used smokeless tobacco. He reports current alcohol use. He reports current drug use. Drug: Cocaine.  Allergies  Allergen Reactions  . Trazodone     Other reaction(s): Priapism   . Lisinopril Swelling    angioedema  . Omeprazole     Other reaction(s): ANGIOEDEMA OF LIPS, Angioedema of tongue.    FamHx No family history on file.  Prior to Admission medications   Medication Sig Start Date End Date Taking? Authorizing Provider  amitriptyline (ELAVIL) 10 MG tablet Take 20 mg by mouth at bedtime.   Yes [provider]  atorvastatin (LIPITOR) 20 MG tablet Take 20 mg by mouth daily.   Yes [provider]  gabapentin (NEURONTIN) 600 MG tablet Take 1,200 mg by mouth at bedtime.   Yes [provider]  glipiZIDE (GLUCOTROL) 10 MG tablet Take 1 tablet (10 mg total) by mouth daily before breakfast. Patient taking differently: Take 10 mg by mouth daily. 08/24/19  Yes [name], PA-C  insulin lispro (HUMALOG) 100 UNIT/ML KwikPen Inject 0-30 Units into the skin 3 (three) times daily.   Yes [provider]  losartan (COZAAR) 25 MG tablet Take 25 mg by mouth daily.   Yes [provider]  melatonin 3 MG TABS tablet Take 12 mg by mouth at bedtime.   Yes [provider]  aspirin EC 81 MG EC tablet Take 1 tablet (81 mg total) by mouth daily. Swallow whole. 03/31/20 04/30/20  Arrien, Jimmy Picket, MD  blood glucose meter kit and supplies KIT Dispense based on patient and insurance preference. Use up to four times daily as directed. (FOR ICD-9 250.00, 250.01). 09/13/19   Isla Pence, MD  cyclobenzaprine (FLEXERIL) 5 MG tablet Take 1 tablet (  5 mg total) by mouth 3 (three) times daily as needed for muscle spasms. 03/30/20   Arrien, Jimmy Picket, MD  empagliflozin (JARDIANCE) 25 MG TABS tablet Take 25 mg by mouth daily.    [provider]  famotidine (PEPCID) 20 MG tablet Take 1 tablet (20 mg total) by mouth daily. 03/31/20 04/30/20  Arrien, Jimmy Picket, MD  feeding supplement (ENSURE ENLIVE / ENSURE PLUS) LIQD Take 237 mLs by mouth 3 (three) times daily between meals. 03/30/20 04/29/20  Arrien, Jimmy Picket, MD   HYDROcodone-acetaminophen (NORCO/VICODIN) 5-325 MG tablet Take 1 tablet by mouth every 6 (six) hours as needed for severe pain. 03/30/20   Arrien, Jimmy Picket, MD  insulin aspart (NOVOLOG FLEXPEN) 100 UNIT/ML FlexPen Inject 5 Units into the skin 3 (three) times daily with meals. Patient not taking: Reported on 04/02/2020 04/08/19   [name], MD  insulin glargine (LANTUS) 100 UNIT/ML Solostar Pen Inject 40 Units into the skin daily. 08/24/19   [name], PA-C  Insulin Pen Needle 29G X 12MM MISC Per instructions 04/08/19   [name], MD  polyethylene glycol (MIRALAX / GLYCOLAX) 17 g packet Take 17 g by mouth daily as needed for moderate constipation. 03/30/20   Arrien, Jimmy Picket, MD  tadalafil (CIALIS) 10 MG tablet Take 20 mg by mouth daily as needed for erectile dysfunction.    [provider]    Physical Exam: Vitals:   04/02/20 1330 04/02/20 1400 04/02/20 1430 04/02/20 1500  BP: (!) 192/95 (!) 174/95 (!) 182/98 (!) 168/91  Pulse: (!) 111 (!) 110 (!) 108 (!) 107  Resp: (!) 22 20 18 18   Temp:      TempSrc:      SpO2: 99% 98% 98% 98%    General: 65 y.o. male resting in bed in NAD Eyes: PERRL, normal sclera ENMT: Nares patent w/o discharge, orophaynx clear, dentition normal, ears w/o discharge/lesions/ulcers Neck: Supple, trachea midline Cardiovascular: RRR, +S1, S2, no m/g/r, equal pulses throughout Respiratory: CTABL, no w/r/r, normal WOB GI: BS+, ND, soft, when pressed with stethoscope NT, when palpated with hand TTP LLQ w/o rebound, no masses noted, no organomegaly noted MSK: No e/c/c Skin: No rashes, bruises, ulcerations noted Neuro: A&O x 3, no focal deficits Psyc: Appropriate interaction and affect, calm/cooperative  Labs on Admission: I have personally reviewed following labs and imaging studies  CBC: Recent Labs  Lab 03/27/20 0341 04/02/20 0845  WBC 8.5 6.6  NEUTROABS  --  5.0  HGB 12.2* 13.6  HCT 36.5* 42.4  MCV 86.1 88.3  PLT  210 353   Basic Metabolic Panel: Recent Labs  Lab 03/27/20 1505 03/28/20 0244 03/29/20 0333 04/02/20 0845  NA 134* 134* 130* 135  K 4.4 5.4* 4.2 4.1  CL 97* 97* 94* 94*  CO2 32 33* 33* 29  GLUCOSE 150* 335* 348* 245*  BUN 12 21 21 13   CREATININE 0.99 1.31* 1.20 0.83  CALCIUM 8.1* 8.7* 8.4* 9.2  MG 1.9  --   --   --   PHOS 3.5  --   --   --    GFR: Estimated Creatinine Clearance: 68.8 mL/min (by C-G formula based on SCr of 0.83 mg/dL). Liver Function Tests: Recent Labs  Lab 04/02/20 0845  AST 33  ALT 32  ALKPHOS 59  BILITOT 0.5  PROT 7.9  ALBUMIN 3.8   Recent Labs  Lab 04/02/20 0845  LIPASE 23   No results for input(s): AMMONIA in the last 168 hours. Coagulation Profile:  No results for input(s): INR, PROTIME in the last 168 hours. Cardiac Enzymes: No results for input(s): CKTOTAL, CKMB, CKMBINDEX, TROPONINI in the last 168 hours. BNP (last 3 results) No results for input(s): PROBNP in the last 8760 hours. HbA1C: No results for input(s): HGBA1C in the last 72 hours. CBG: Recent Labs  Lab 03/29/20 1651 03/29/20 2118 03/30/20 0639 03/30/20 1054 03/30/20 1623  GLUCAP 376* 258* 231* 177* 385*   Lipid Profile: No results for input(s): CHOL, HDL, LDLCALC, TRIG, CHOLHDL, LDLDIRECT in the last 72 hours. Thyroid Function Tests: No results for input(s): TSH, T4TOTAL, FREET4, T3FREE, THYROIDAB in the last 72 hours. Anemia Panel: No results for input(s): VITAMINB12, FOLATE, FERRITIN, TIBC, IRON, RETICCTPCT in the last 72 hours. Urine analysis:    Component Value Date/Time   COLORURINE STRAW (A) 04/02/2020 1237   APPEARANCEUR CLEAR 04/02/2020 1237   LABSPEC 1.014 04/02/2020 1237   PHURINE 9.0 (H) 04/02/2020 1237   GLUCOSEU >=500 (A) 04/02/2020 1237   HGBUR NEGATIVE 04/02/2020 [place] 04/02/2020 1237   KETONESUR NEGATIVE 04/02/2020 1237   PROTEINUR 100 (A) 04/02/2020 1237   NITRITE NEGATIVE 04/02/2020 1237   LEUKOCYTESUR NEGATIVE  04/02/2020 1237    Radiological Exams on Admission: CT Abdomen Pelvis W Contrast  Result Date: 04/02/2020 CLINICAL DATA:  Lower abdominal pain for 1 month. EXAM: CT ABDOMEN AND PELVIS WITH CONTRAST TECHNIQUE: Multidetector CT imaging of the abdomen and pelvis was performed using the standard protocol following bolus administration of intravenous contrast. CONTRAST:  178m OMNIPAQUE IOHEXOL 300 MG/ML  SOLN COMPARISON:  03/25/2020 FINDINGS: Lower chest: No acute abnormality. Hepatobiliary: No focal hepatic abnormality. Gallbladder unremarkable. Pancreas: No focal abnormality or ductal dilatation. Spleen: No focal abnormality.  Normal size. Adrenals/Urinary Tract: Areas of cortical thinning and scarring in the mid and lower pole of the right kidney. Small cysts in the upper and mid poles of the right kidney. No hydronephrosis. Adrenal glands unremarkable. Urinary bladder moderately distended, grossly unremarkable otherwise. Stomach/Bowel: Moderate stool burden throughout the colon. Stomach, large and small bowel grossly unremarkable. Normal appendix. Vascular/Lymphatic: Densely calcified aorta and iliac vessels. No evidence of aneurysm or adenopathy. Reproductive: No visible focal abnormality. Other: No free fluid or free air. Musculoskeletal: No acute bony abnormality. IMPRESSION: Heavily calcified aorta and iliac vessels.  No aneurysm. No acute findings in the abdomen or pelvis. Areas of cortical thinning and scarring in the mid and lower pole of the right kidney, stable. Electronically Signed   By: KRolm BaptiseM.D.   On: 04/02/2020 11:42   DG Chest Port 1 View  Result Date: 04/02/2020 CLINICAL DATA:  Weakness and pain EXAM: PORTABLE CHEST 1 VIEW COMPARISON:  January 18, 2019 chest radiograph; chest CT March 25, 2020 FINDINGS: Lungs are clear. The heart size and pulmonary vascularity are normal. No adenopathy. There is aortic atherosclerosis. No bone lesions. IMPRESSION: Lungs clear. Heart size normal.  Aortic Atherosclerosis (ICD10-I70.0). Electronically Signed   By: WLowella GripIII M.D.   On: 04/02/2020 08:55    EKG: Independently reviewed. Sinus tach  Assessment/Plan Abdominal pain N/V     - admit to inpt, tele     - pain is inconsistent, CT ab/pelvis is negative, labs are unremarkable     - will add bentyl     - it is possible that there is a gastroparesis element     - add compazine for N/V     - CLD for now  HTN     - systolics consistently 1016-  27     - cozaar is one of his home meds, but he has an angioedema allergy to ACEi      - d/c cozaar, add scheduled metoprolol; PRN hydralazine  Hx of CVA     - ASA, statin     - non-focal exam  DM2     - lantus, SSI, CLD, glucose checks  HLD     - continue statin  GERD     - pepcid  DVT prophylaxis: SCDs  Code Status: FULL  Family Communication: None at bedside  Consults called: None   Status is: Inpatient  Remains inpatient appropriate because:Inpatient level of care appropriate due to severity of illness   Dispo: The patient is from: Home              Anticipated d/c is to: Home              Patient currently is not medically stable to d/c.   Difficult to place patient No  Jonnie Finner DO [facility]  If 7PM-7AM, please contact night-coverage [email or web address]  04/02/2020, 4:00 PM

## 2020-04-02 NOTE — Progress Notes (Signed)
Inpatient Diabetes Program Recommendations  AACE/ADA: New Consensus Statement on Inpatient Glycemic Control  Target Ranges:  Prepandial:   less than 140 mg/dL      Peak postprandial:   less than 180 mg/dL (1-2 hours)      Critically ill patients:  140 - 180 mg/dL   Results for CAYMAN, KIELBASA (MRN [id number]) as of 04/02/2020 12:02  Ref. Range 04/02/2020 08:45  Glucose Latest Ref Range: 70 - 99 mg/dL 494 (H)  Results for DALTON, MOLESWORTH (MRN [id number]) as of 04/02/2020 12:02  Ref. Range 04/07/2019 04:02 03/26/2020 04:46  Hemoglobin A1C Latest Ref Range: 4.8 - 5.6 % 14.3 (H) 10.6 (H)   Review of Glycemic Control  Diabetes history: DM2 Outpatient Diabetes medications: Jardiance 25 mg daily, Glipizide 10 daily, Lantus 40 units daily, Novolog 5 units TID with meals Current orders for Inpatient glycemic control: None; in ED  Inpatient Diabetes Program Recommendations:    Insulin: If admitted, please consider ordering Lantus 15 units Q24H, CBGs AC&HS, Novolog 0-9 units TID with meals, Novolog 0-5 units QHS, and Novolog 3 units TID with meals if patient is ordered diet and eating at least 50% of meals.  HbgA1C:  A1C 10.6% on 03/26/20. Patient was seen by inpatient diabetes coordinator on 03/27/20 during prior admission.    Thanks, Orlando Penner, RN, MSN, CDE Diabetes Coordinator Inpatient Diabetes Program [phone] (Team Pager from 8am to 5pm)

## 2020-04-02 NOTE — ED Provider Notes (Signed)
[facility] DEPT Provider Note   CSN: [id number] Arrival date & time: 04/02/20  6789     History Chief Complaint  Patient presents with  . Abdominal Pain    William Acevedo is a 65 y.o. male.  65 year old male with prior medical history as detailed below presents for evaluation.  Patient reports diffuse abdominal pain with associated nausea and vomiting.  Patient reports onset of symptoms roughly 2 days ago.  Patient was just discharged on March 14.  Patient denies associated fever.  He reports that his last use of cocaine was prior to his last admission.  He denies chest pain or shortness of breath.  The history is provided by the patient and medical records.  Abdominal Pain Pain location:  Generalized Pain quality: aching   Pain radiates to:  Does not radiate Pain severity:  Moderate Onset quality:  Gradual Duration:  2 days Timing:  Constant Progression:  Unable to specify Relieved by:  Nothing Worsened by:  Nothing      Past Medical History:  Diagnosis Date  . Carpal tunnel syndrome   . Depression   . Diabetes mellitus without complication ([place])   . Neuropathy     Patient Active Problem List   Diagnosis Date Noted  . Protein-calorie malnutrition, severe 03/27/2020  . Cerebral thrombosis with cerebral infarction 03/26/2020  . NSTEMI (non-ST elevated myocardial infarction) ([place]) 03/25/2020  . Secondary diabetes mellitus with HHNC (hyperglycemia hyperosmolar non-ketotic coma) ([place]) 03/25/2020  . Elevated troponin   . Renal infarct ([place])   . Cerebrovascular accident (CVA) due to embolism of cerebral artery ([place])   . Cocaine use disorder, severe, dependence ([place]) 04/08/2019  . Major depressive disorder 04/08/2019  . Epiglottitis 04/07/2019  . Cocaine use 04/07/2019  . Acute encephalopathy 04/07/2019  . Overdose 07/06/2015  . AKI (acute kidney injury) ([place]) 07/06/2015  . Polysubstance abuse ([place]) 07/06/2015  . DM2  (diabetes mellitus, type 2) ([place]) 07/06/2015  . Dehydration   . MDD (major depressive disorder), recurrent episode, mild ([place])   . Uncomplicated alcohol dependence ([place])     Past Surgical History:  Procedure Laterality Date  . BUBBLE STUDY  03/30/2020   Procedure: BUBBLE STUDY;  Surgeon: Jerline Pain, MD;  Location: [facility];  Service: Cardiovascular;;  . TEE WITHOUT CARDIOVERSION N/A 03/30/2020   Procedure: TRANSESOPHAGEAL ECHOCARDIOGRAM (TEE);  Surgeon: Jerline Pain, MD;  Location: [facility] ENDOSCOPY;  Service: Cardiovascular;  Laterality: N/A;  . TESTICLE TORSION REDUCTION         No family history on file.  Social History   Tobacco Use  . Smoking status: Current Every Day Smoker    Packs/day: 0.50    Types: Cigarettes  . Smokeless tobacco: Never Used  Substance Use Topics  . Alcohol use: Yes  . Drug use: Yes    Types: Cocaine    Home Medications Prior to Admission medications   Medication Sig Start Date End Date Taking? Authorizing Provider  amitriptyline (ELAVIL) 10 MG tablet Take 20 mg by mouth at bedtime.    [provider]  aspirin EC 81 MG EC tablet Take 1 tablet (81 mg total) by mouth daily. Swallow whole. 03/31/20 04/30/20  Arrien, Jimmy Picket, MD  atorvastatin (LIPITOR) 20 MG tablet Take 20 mg by mouth daily.    [provider]  blood glucose meter kit and supplies KIT Dispense based on patient and insurance preference. Use up to four times daily as directed. (FOR ICD-9 250.00, 250.01). 09/13/19  Isla Pence, MD  cyclobenzaprine (FLEXERIL) 5 MG tablet Take 1 tablet (5 mg total) by mouth 3 (three) times daily as needed for muscle spasms. 03/30/20   Arrien, Jimmy Picket, MD  empagliflozin (JARDIANCE) 25 MG TABS tablet Take 25 mg by mouth daily.    [provider]  famotidine (PEPCID) 20 MG tablet Take 1 tablet (20 mg total) by mouth daily. 03/31/20 04/30/20  Arrien, Jimmy Picket, MD  feeding supplement (ENSURE ENLIVE / ENSURE  PLUS) LIQD Take 237 mLs by mouth 3 (three) times daily between meals. 03/30/20 04/29/20  Arrien, Jimmy Picket, MD  gabapentin (NEURONTIN) 600 MG tablet Take 1,200 mg by mouth at bedtime.    [provider]  glipiZIDE (GLUCOTROL) 10 MG tablet Take 1 tablet (10 mg total) by mouth daily before breakfast. Patient taking differently: Take 10 mg by mouth daily. 08/24/19   [name], PA-C  HYDROcodone-acetaminophen (NORCO/VICODIN) 5-325 MG tablet Take 1 tablet by mouth every 6 (six) hours as needed for severe pain. 03/30/20   Arrien, Jimmy Picket, MD  insulin aspart (NOVOLOG FLEXPEN) 100 UNIT/ML FlexPen Inject 5 Units into the skin 3 (three) times daily with meals. 04/08/19   [name], MD  insulin glargine (LANTUS) 100 UNIT/ML Solostar Pen Inject 40 Units into the skin daily. 08/24/19   [name], PA-C  Insulin Pen Needle 29G X 12MM MISC Per instructions 04/08/19   [name], MD  losartan (COZAAR) 25 MG tablet Take 25 mg by mouth daily.    [provider]  melatonin 3 MG TABS tablet Take 12 mg by mouth at bedtime.    [provider]  polyethylene glycol (MIRALAX / GLYCOLAX) 17 g packet Take 17 g by mouth daily as needed for moderate constipation. 03/30/20   Arrien, Jimmy Picket, MD  tadalafil (CIALIS) 10 MG tablet Take 20 mg by mouth daily as needed for erectile dysfunction.    [provider]    Allergies    Trazodone, Lisinopril, and Omeprazole  Review of Systems   Review of Systems  Gastrointestinal: Positive for abdominal pain.  All other systems reviewed and are negative.   Physical Exam Updated Vital Signs BP (!) 214/112   Pulse (!) 110   Temp 98.2 F (36.8 C)   Resp (!) 21   SpO2 100%   Physical Exam Vitals and nursing note reviewed.  Constitutional:      General: He is not in acute distress.    Appearance: He is well-developed.     Comments: Actively vomiting    HENT:     Head: Normocephalic and atraumatic.   Eyes:     Conjunctiva/sclera: Conjunctivae normal.     Pupils: Pupils are equal, round, and reactive to light.  Cardiovascular:     Rate and Rhythm: Normal rate and regular rhythm.     Heart sounds: Normal heart sounds.  Pulmonary:     Effort: Pulmonary effort is normal. No respiratory distress.     Breath sounds: Normal breath sounds.  Abdominal:     General: There is no distension.     Palpations: Abdomen is soft.     Tenderness: There is no abdominal tenderness.  Musculoskeletal:        General: No deformity. Normal range of motion.     Cervical back: Normal range of motion and neck supple.  Skin:    General: Skin is warm and dry.  Neurological:     Mental Status: He is alert and oriented to  person, place, and time.     ED Results / Procedures / Treatments   Labs (all labs ordered are listed, but only abnormal results are displayed) Labs Reviewed  ACETAMINOPHEN LEVEL - Abnormal; Notable for the following components:      Result Value   Acetaminophen (Tylenol), Serum <10 (*)    All other components within normal limits  SALICYLATE LEVEL - Abnormal; Notable for the following components:   Salicylate Lvl <9.4 (*)    All other components within normal limits  LACTIC ACID, PLASMA - Abnormal; Notable for the following components:   Lactic Acid, Venous 2.1 (*)    All other components within normal limits  ETHANOL  CBC WITH DIFFERENTIAL/PLATELET  COMPREHENSIVE METABOLIC PANEL  LIPASE, BLOOD  URINALYSIS, ROUTINE W REFLEX MICROSCOPIC  RAPID URINE DRUG SCREEN, HOSP PERFORMED  LACTIC ACID, PLASMA  TROPONIN I (HIGH SENSITIVITY)  TROPONIN I (HIGH SENSITIVITY)    EKG EKG Interpretation  Date/Time:  Thursday April 02 2020 09:00:06 EDT Ventricular Rate:  112 PR Interval:    QRS Duration: 80 QT Interval:  332 QTC Calculation: 454 R Axis:   64 Text Interpretation: Sinus tachycardia Right atrial enlargement Probable inferior infarct, old Probable anteroseptal infarct, old  Confirmed by Dene Gentry [phone]) on 04/02/2020 9:05:41 AM   Radiology DG Chest Port 1 View  Result Date: 04/02/2020 CLINICAL DATA:  Weakness and pain EXAM: PORTABLE CHEST 1 VIEW COMPARISON:  January 18, 2019 chest radiograph; chest CT March 25, 2020 FINDINGS: Lungs are clear. The heart size and pulmonary vascularity are normal. No adenopathy. There is aortic atherosclerosis. No bone lesions. IMPRESSION: Lungs clear. Heart size normal. Aortic Atherosclerosis (ICD10-I70.0). Electronically Signed   By: Lowella Grip III M.D.   On: 04/02/2020 08:55    Procedures Procedures   Medications Ordered in ED Medications  sodium chloride 0.9 % bolus 500 mL (has no administration in time range)  ondansetron (ZOFRAN) injection 4 mg (4 mg Intravenous Given 04/02/20 0843)  LORazepam (ATIVAN) injection 0.5 mg (0.5 mg Intravenous Given 04/02/20 0843)    ED Course  I have reviewed the triage vital signs and the nursing notes.  Pertinent labs & imaging results that were available during my care of the patient were reviewed by me and considered in my medical decision making (see chart for details).    MDM Rules/Calculators/A&P                          MDM  Screen complete  XZAVIER SWINGER was evaluated in Emergency Department on 04/02/2020 for the symptoms described in the history of present illness. He was evaluated in the context of the global COVID-19 pandemic, which necessitated consideration that the patient might be at risk for infection with the SARS-CoV-2 virus that causes COVID-19. Institutional protocols and algorithms that pertain to the evaluation of patients at risk for COVID-19 are in a state of rapid change based on information released by regulatory bodies including the CDC and federal and state organizations. These policies and algorithms were followed during the patient's care in the ED.  Patient is presenting resenting for evaluation of reported abdominal pain with associated  nausea and vomiting.  Patient with recent admission and discharge 48 hours prior.  Patient denies recent cocaine use.    Patient's work-up is concerning given mildly elevated lactic acid, and persistent discomfort.  CT imaging does not reveal significant acute pathology in the abdomen.  Patient is noted to be persistently tachycardic and  hypertensive.  Patient would likely benefit from readmission for further work-up and treatment.  Hospitalist services aware of case and will evaluate for admission.     Final Clinical Impression(s) / ED Diagnoses Final diagnoses:  Generalized abdominal pain  Nausea and vomiting, intractability of vomiting not specified, unspecified vomiting type    Rx / DC Orders ED Discharge Orders    None       Valarie Merino, MD 04/02/20 1404

## 2020-04-02 NOTE — ED Notes (Signed)
Placed external male condom catheter on patient.  

## 2020-04-02 NOTE — ED Notes (Signed)
ED TO INPATIENT HANDOFF REPORT  Name/Age/Gender William Acevedo 65 y.o. male  Code Status Code Status History    Date Active Date Inactive Code Status Order ID Comments User Context   03/25/2020 1759 03/30/2020 2208 Full Code [id number]  Lanae Boast, MD ED   04/07/2019 0401 04/08/2019 2231 Full Code [id number]  Joselyn Arrow, MD ED   08/26/2016 1421 08/27/2016 1618 Full Code [id number]  Marily Memos, MD ED   08/26/2016 1159 08/26/2016 1421 Full Code [id number]  Wojeck, Hinton Dyer, NP ED   07/06/2015 0025 07/06/2015 1653 Full Code [id number]  Hillary Bow, DO ED   09/04/2014 2047 09/05/2014 2011 Full Code [id number]  Earley Favor, NP ED   Advance Care Planning Activity    Questions for Most Recent Historical Code Status (Order [id number])       Home/SNF/Other Homeless  Chief Complaint Abdominal pain [R10.9]  Level of Care/Admitting Diagnosis ED Disposition    ED Disposition Condition Comment   Admit  Hospital Area: [facility] [[id number]]  Level of Care: Telemetry [5]  Admit to tele based on following criteria: Monitor for Ischemic changes  May admit patient to [facility] or [facility] if equivalent level of care is available:: No  Covid Evaluation: Asymptomatic Screening Protocol (No Symptoms)  Diagnosis: Abdominal pain [774128]  Admitting Physician: Teddy Spike [[id number]]  Attending Physician: Teddy Spike [[id number]]  Estimated length of stay: past midnight tomorrow  Certification:: I certify this patient will need inpatient services for at least 2 midnights       Medical History Past Medical History:  Diagnosis Date  . Carpal tunnel syndrome   . Depression   . Diabetes mellitus without complication (HCC)   . Neuropathy     Allergies Allergies  Allergen Reactions  . Trazodone     Other reaction(s): Priapism  . Lisinopril Swelling    angioedema  . Omeprazole     Other reaction(s): ANGIOEDEMA OF LIPS, Angioedema of tongue.    IV  Location/Drains/Wounds Patient Lines/Drains/Airways Status    Active Line/Drains/Airways    Name Placement date Placement time Site Days   Peripheral IV 04/02/20 Left Antecubital 04/02/20  0842  Antecubital  less than 1          Labs/Imaging Results for orders placed or performed during the hospital encounter of 04/02/20 (from the past 48 hour(s))  Comprehensive metabolic panel     Status: Abnormal   Collection Time: 04/02/20  8:45 AM  Result Value Ref Range   Sodium 135 135 - 145 mmol/L   Potassium 4.1 3.5 - 5.1 mmol/L   Chloride 94 (L) 98 - 111 mmol/L   CO2 29 22 - 32 mmol/L   Glucose, Bld 245 (H) 70 - 99 mg/dL    Comment: Glucose reference range applies only to samples taken after fasting for at least 8 hours.   BUN 13 8 - 23 mg/dL   Creatinine, Ser 2.83 0.61 - 1.24 mg/dL   Calcium 9.2 8.9 - 66.2 mg/dL   Total Protein 7.9 6.5 - 8.1 g/dL   Albumin 3.8 3.5 - 5.0 g/dL   AST 33 15 - 41 U/L   ALT 32 0 - 44 U/L   Alkaline Phosphatase 59 38 - 126 U/L   Total Bilirubin 0.5 0.3 - 1.2 mg/dL   GFR, Estimated >94 >76 mL/min    Comment: (NOTE) Calculated using the CKD-EPI Creatinine Equation (2021)    Anion gap 12 5 - 15    Comment:  Performed at [facility], [address], [name], [place] [address]  Ethanol     Status: None   Collection Time: 04/02/20  8:45 AM  Result Value Ref Range   Alcohol, Ethyl (B) <10 <10 mg/dL    Comment: (NOTE) Lowest detectable limit for serum alcohol is 10 mg/dL.  For medical purposes only. Performed at [facility], [address] [place], [place] [address]   Lipase, blood     Status: None   Collection Time: 04/02/20  8:45 AM  Result Value Ref Range   Lipase 23 11 - 51 U/L    Comment: Performed at [facility], [address], [place], [place] [address]  CBC with Differential     Status: None   Collection Time: 04/02/20  8:45 AM  Result Value Ref Range   WBC 6.6 4.0 - 10.5 K/uL    RBC 4.80 4.22 - 5.81 MIL/uL   Hemoglobin 13.6 13.0 - 17.0 g/dL   HCT 38.1 82.9 - 93.7 %   MCV 88.3 80.0 - 100.0 fL   MCH 28.3 26.0 - 34.0 pg   MCHC 32.1 30.0 - 36.0 g/dL   RDW 16.9 67.8 - 93.8 %   Platelets 369 150 - 400 K/uL   nRBC 0.0 0.0 - 0.2 %   Neutrophils Relative % 74 %   Neutro Abs 5.0 1.7 - 7.7 K/uL   Lymphocytes Relative 18 %   Lymphs Abs 1.2 0.7 - 4.0 K/uL   Monocytes Relative 7 %   Monocytes Absolute 0.4 0.1 - 1.0 K/uL   Eosinophils Relative 0 %   Eosinophils Absolute 0.0 0.0 - 0.5 K/uL   Basophils Relative 0 %   Basophils Absolute 0.0 0.0 - 0.1 K/uL   Immature Granulocytes 1 %   Abs Immature Granulocytes 0.04 0.00 - 0.07 K/uL    Comment: Performed at [facility], [address] [place], [place] [address]  Acetaminophen level     Status: Abnormal   Collection Time: 04/02/20  8:45 AM  Result Value Ref Range   Acetaminophen (Tylenol), Serum <10 (L) 10 - 30 ug/mL    Comment: (NOTE) Therapeutic concentrations vary significantly. A range of 10-30 ug/mL  may be an effective concentration for many patients. However, some  are best treated at concentrations outside of this range. Acetaminophen concentrations >150 ug/mL at 4 hours after ingestion  and >50 ug/mL at 12 hours after ingestion are often associated with  toxic reactions.  Performed at [facility], [address], [place], [place] [address]   Salicylate level     Status: Abnormal   Collection Time: 04/02/20  8:45 AM  Result Value Ref Range   Salicylate Lvl <7.0 (L) 7.0 - 30.0 mg/dL    Comment: Performed at [facility], [address] [place], [place] [address]  Lactic acid, plasma     Status: Abnormal   Collection Time: 04/02/20  8:45 AM  Result Value Ref Range   Lactic Acid, Venous 2.1 (HH) 0.5 - 1.9 mmol/L    Comment: CRITICAL RESULT CALLED TO, READ BACK BY AND VERIFIED WITH: WEST,S. RN AT [phone] 04/02/20 MULLINS,T Performed at [facility], [address] [place], [place] [address]   Troponin I (High Sensitivity)     Status: Abnormal   Collection Time: 04/02/20  8:45 AM  Result Value Ref Range   Troponin I (High Sensitivity) 194 (HH) <18 ng/L    Comment: CRITICAL  RESULT CALLED TO, READ BACK BY AND VERIFIED WITH: WEST,S. RN AT 1036 04/02/20 MULLINS,T (NOTE) Elevated high sensitivity troponin I (hsTnI) values and significant  changes across serial measurements may suggest ACS but many other  chronic and acute conditions are known to elevate hsTnI results.  Refer to the Links section for chest pain algorithms and additional  guidance. Performed at [facility], [address] [place], [place] [address]   Lactic acid, plasma     Status: Abnormal   Collection Time: 04/02/20 11:59 AM  Result Value Ref Range   Lactic Acid, Venous 2.1 (HH) 0.5 - 1.9 mmol/L    Comment: CRITICAL VALUE NOTED.  VALUE IS CONSISTENT WITH PREVIOUSLY REPORTED AND CALLED VALUE. Performed at [facility], [address] [place], [place] [address]   Troponin I (High Sensitivity)     Status: Abnormal   Collection Time: 04/02/20 11:59 AM  Result Value Ref Range   Troponin I (High Sensitivity) 211 (HH) <18 ng/L    Comment: CRITICAL VALUE NOTED.  VALUE IS CONSISTENT WITH PREVIOUSLY REPORTED AND CALLED VALUE. (NOTE) Elevated high sensitivity troponin I (hsTnI) values and significant  changes across serial measurements may suggest ACS but many other  chronic and acute conditions are known to elevate hsTnI results.  Refer to the Links section for chest pain algorithms and additional  guidance. Performed at [facility], [address] [place], [place] [address]   Urinalysis, Routine w reflex microscopic Urine, Clean Catch     Status: Abnormal   Collection Time: 04/02/20 12:37 PM  Result Value Ref Range   Color, Urine STRAW (A) YELLOW   APPearance CLEAR CLEAR   Specific  Gravity, Urine 1.014 1.005 - 1.030   pH 9.0 (H) 5.0 - 8.0   Glucose, UA >=500 (A) NEGATIVE mg/dL   Hgb urine dipstick NEGATIVE NEGATIVE   Bilirubin Urine NEGATIVE NEGATIVE   Ketones, ur NEGATIVE NEGATIVE mg/dL   Protein, ur 621100 (A) NEGATIVE mg/dL   Nitrite NEGATIVE NEGATIVE   Leukocytes,Ua NEGATIVE NEGATIVE   RBC / HPF 0-5 0 - 5 RBC/hpf   WBC, UA 0-5 0 - 5 WBC/hpf   Bacteria, UA RARE (A) NONE SEEN    Comment: Performed at [facility], [address] [place], [place] [address]  Urine rapid drug screen (hosp performed)     Status: Abnormal   Collection Time: 04/02/20 12:37 PM  Result Value Ref Range   Opiates POSITIVE (A) NONE DETECTED   Cocaine NONE DETECTED NONE DETECTED   Benzodiazepines NONE DETECTED NONE DETECTED   Amphetamines NONE DETECTED NONE DETECTED   Tetrahydrocannabinol NONE DETECTED NONE DETECTED   Barbiturates NONE DETECTED NONE DETECTED    Comment: (NOTE) DRUG SCREEN FOR MEDICAL PURPOSES ONLY.  IF CONFIRMATION IS NEEDED FOR ANY PURPOSE, NOTIFY LAB WITHIN 5 DAYS.  LOWEST DETECTABLE LIMITS FOR URINE DRUG SCREEN Drug Class                     Cutoff (ng/mL) Amphetamine and metabolites    1000 Barbiturate and metabolites    200 Benzodiazepine                 200 Tricyclics and metabolites     300 Opiates and metabolites        300 Cocaine and metabolites        300 THC  50 Performed at [facility], [address] [place], [place] [address]   Resp Panel by RT-PCR (Flu A&B, Covid) Nasopharyngeal Swab     Status: None   Collection Time: 04/02/20  2:21 PM   Specimen: Nasopharyngeal Swab; Nasopharyngeal(NP) swabs in vial transport medium  Result Value Ref Range   SARS Coronavirus 2 by RT PCR NEGATIVE NEGATIVE    Comment: (NOTE) SARS-CoV-2 target nucleic acids are NOT DETECTED.  The SARS-CoV-2 RNA is generally detectable in upper respiratory specimens during the acute phase of infection. The  lowest concentration of SARS-CoV-2 viral copies this assay can detect is 138 copies/mL. A negative result does not preclude SARS-Cov-2 infection and should not be used as the sole basis for treatment or other patient management decisions. A negative result may occur with  improper specimen collection/handling, submission of specimen other than nasopharyngeal swab, presence of viral mutation(s) within the areas targeted by this assay, and inadequate number of viral copies(<138 copies/mL). A negative result must be combined with clinical observations, patient history, and epidemiological information. The expected result is Negative.  Fact Sheet for Patients:  BloggerCourse.com  Fact Sheet for Healthcare Providers:  SeriousBroker.it  This test is no t yet approved or cleared by the [place] FDA and  has been authorized for detection and/or diagnosis of SARS-CoV-2 by FDA under an Emergency Use Authorization (EUA). This EUA will remain  in effect (meaning this test can be used) for the duration of the COVID-19 declaration under Section 564(b)(1) of the Act, 21 U.S.C.section 360bbb-3(b)(1), unless the authorization is terminated  or revoked sooner.       Influenza A by PCR NEGATIVE NEGATIVE   Influenza B by PCR NEGATIVE NEGATIVE    Comment: (NOTE) The Xpert Xpress SARS-CoV-2/FLU/RSV plus assay is intended as an aid in the diagnosis of influenza from Nasopharyngeal swab specimens and should not be used as a sole basis for treatment. Nasal washings and aspirates are unacceptable for Xpert Xpress SARS-CoV-2/FLU/RSV testing.  Fact Sheet for Patients: BloggerCourse.com  Fact Sheet for Healthcare Providers: SeriousBroker.it  This test is not yet approved or cleared by the [place] FDA and has been authorized for detection and/or diagnosis of SARS-CoV-2 by FDA under an Emergency  Use Authorization (EUA). This EUA will remain in effect (meaning this test can be used) for the duration of the COVID-19 declaration under Section 564(b)(1) of the Act, 21 U.S.C. section 360bbb-3(b)(1), unless the authorization is terminated or revoked.  Performed at [facility], [address], [place], [place] [address]    CT Abdomen Pelvis W Contrast  Result Date: 04/02/2020 CLINICAL DATA:  Lower abdominal pain for 1 month. EXAM: CT ABDOMEN AND PELVIS WITH CONTRAST TECHNIQUE: Multidetector CT imaging of the abdomen and pelvis was performed using the standard protocol following bolus administration of intravenous contrast. CONTRAST:  OMNIPAQUE IOHEXOL 300 MG/ML  SOLN COMPARISON:  03/25/2020 FINDINGS: Lower chest: No acute abnormality. Hepatobiliary: No focal hepatic abnormality. Gallbladder unremarkable. Pancreas: No focal abnormality or ductal dilatation. Spleen: No focal abnormality.  Normal size. Adrenals/Urinary Tract: Areas of cortical thinning and scarring in the mid and lower pole of the right kidney. Small cysts in the upper and mid poles of the right kidney. No hydronephrosis. Adrenal glands unremarkable. Urinary bladder moderately distended, grossly unremarkable otherwise. Stomach/Bowel: Moderate stool burden throughout the colon. Stomach, large and small bowel grossly unremarkable. Normal appendix. Vascular/Lymphatic: Densely calcified aorta and iliac vessels. No evidence of aneurysm or adenopathy. Reproductive: No visible focal  abnormality. Other: No free fluid or free air. Musculoskeletal: No acute bony abnormality. IMPRESSION: Heavily calcified aorta and iliac vessels.  No aneurysm. No acute findings in the abdomen or pelvis. Areas of cortical thinning and scarring in the mid and lower pole of the right kidney, stable. Electronically Signed   By: Charlett Nose M.D.   On: 04/02/2020 11:42   DG Chest Port 1 View  Result Date: 04/02/2020 CLINICAL DATA:  Weakness  and pain EXAM: PORTABLE CHEST 1 VIEW COMPARISON:  January 18, 2019 chest radiograph; chest CT March 25, 2020 FINDINGS: Lungs are clear. The heart size and pulmonary vascularity are normal. No adenopathy. There is aortic atherosclerosis. No bone lesions. IMPRESSION: Lungs clear. Heart size normal. Aortic Atherosclerosis (ICD10-I70.0). Electronically Signed   By: Bretta Bang III M.D.   On: 04/02/2020 08:55    Pending Labs [employer] (From admission, onward)          Start     Ordered   Signed and Held  Comprehensive metabolic panel  Tomorrow morning,   R        Signed and Held   Signed and Held  CBC  Tomorrow morning,   R        Signed and Held          Vitals/Pain Today's Vitals   04/02/20 1730 04/02/20 1845 04/02/20 2000 04/02/20 2009  BP: (!) 164/101 (!) 178/104  (!) 165/96  Pulse: 99 99  79  Resp: Temp:    98.8 F (37.1 C)  TempSrc:      SpO2: 99% 99%  98%  PainSc:   9      Isolation Precautions No active isolations  Medications Medications  aspirin EC tablet 81 mg (81 mg Oral Given 04/02/20 2009)  HYDROcodone-acetaminophen (NORCO/VICODIN) 5-325 MG per tablet 1 tablet (1 tablet Oral Given 04/02/20 2009)  atorvastatin (LIPITOR) tablet 20 mg (20 mg Oral Given 04/02/20 2009)  famotidine (PEPCID) tablet 20 mg (20 mg Oral Given 04/02/20 2009)  insulin aspart (novoLOG) injection 0-9 Units (has no administration in time range)  0.9 %  sodium chloride infusion (has no administration in time range)  prochlorperazine (COMPAZINE) injection 10 mg (has no administration in time range)  dicyclomine (BENTYL) tablet 20 mg (has no administration in time range)  hydrALAZINE (APRESOLINE) injection 10 mg (has no administration in time range)  insulin glargine (LANTUS) injection 15 Units (has no administration in time range)  ondansetron (ZOFRAN) injection 4 mg (4 mg Intravenous Given 04/02/20 0843)  LORazepam (ATIVAN) injection 0.5 mg (0.5 mg Intravenous Given 04/02/20 0843)   sodium chloride 0.9 % bolus 500 mL (0 mLs Intravenous Stopped 04/02/20 1155)  iohexol (OMNIPAQUE) 300 MG/ML solution 100 mL (100 mLs Intravenous Contrast Given 04/02/20 1113)  morphine 4 MG/ML injection 4 mg (4 mg Intravenous Given 04/02/20 1353)  ondansetron (ZOFRAN) injection 4 mg (4 mg Intravenous Given 04/02/20 1353)  0.9 %  sodium chloride infusion ( Intravenous New Bag/Given 04/02/20 1354)  metoprolol tartrate (LOPRESSOR) tablet 50 mg (50 mg Oral Given 04/02/20 1846)    Mobility Standby assist

## 2020-04-02 NOTE — ED Triage Notes (Signed)
Pt complains of lower abdominal pain x 1 month. Also has n/v/d.

## 2020-04-02 NOTE — Progress Notes (Signed)
SBAR reviewed and questions answered by [employer].

## 2020-04-02 NOTE — ED Notes (Signed)
Provider informed of elevated BP

## 2020-04-03 ENCOUNTER — Other Ambulatory Visit: Payer: Self-pay

## 2020-04-03 ENCOUNTER — Encounter (HOSPITAL_COMMUNITY): Payer: Self-pay | Admitting: Internal Medicine

## 2020-04-03 DIAGNOSIS — R1084 Generalized abdominal pain: Secondary | ICD-10-CM

## 2020-04-03 LAB — CBC
HCT: 36.1 % — ABNORMAL LOW (ref 39.0–52.0)
Hemoglobin: 11.6 g/dL — ABNORMAL LOW (ref 13.0–17.0)
MCH: 28.2 pg (ref 26.0–34.0)
MCHC: 32.1 g/dL (ref 30.0–36.0)
MCV: 87.8 fL (ref 80.0–100.0)
Platelets: 336 10*3/uL (ref 150–400)
RBC: 4.11 MIL/uL — ABNORMAL LOW (ref 4.22–5.81)
RDW: 12.4 % (ref 11.5–15.5)
WBC: 6.9 10*3/uL (ref 4.0–10.5)
nRBC: 0 % (ref 0.0–0.2)

## 2020-04-03 LAB — COMPREHENSIVE METABOLIC PANEL
ALT: 23 U/L (ref 0–44)
AST: 23 U/L (ref 15–41)
Albumin: 3 g/dL — ABNORMAL LOW (ref 3.5–5.0)
Alkaline Phosphatase: 47 U/L (ref 38–126)
Anion gap: 9 (ref 5–15)
BUN: 16 mg/dL (ref 8–23)
CO2: 25 mmol/L (ref 22–32)
Calcium: 8.6 mg/dL — ABNORMAL LOW (ref 8.9–10.3)
Chloride: 99 mmol/L (ref 98–111)
Creatinine, Ser: 0.92 mg/dL (ref 0.61–1.24)
GFR, Estimated: >60 mL/min (ref >60)
Glucose, Bld: 233 mg/dL — ABNORMAL HIGH (ref 70–99)
Potassium: 4.3 mmol/L (ref 3.5–5.1)
Sodium: 133 mmol/L — ABNORMAL LOW (ref 135–145)
Total Bilirubin: 0.1 mg/dL — ABNORMAL LOW (ref 0.3–1.2)
Total Protein: 6.3 g/dL — ABNORMAL LOW (ref 6.5–8.1)

## 2020-04-03 LAB — GLUCOSE, CAPILLARY
Glucose-Capillary: 168 mg/dL — ABNORMAL HIGH (ref 70–99)
Glucose-Capillary: 168 mg/dL — ABNORMAL HIGH (ref 70–99)
Glucose-Capillary: 119 mg/dL — ABNORMAL HIGH (ref 70–99)
Glucose-Capillary: 372 mg/dL — ABNORMAL HIGH (ref 70–99)

## 2020-04-03 MED ORDER — ENSURE ENLIVE PO LIQD
237.0000 mL | Freq: Three times a day (TID) | ORAL | Status: DC
  Administered 2020-04-03 – 2020-04-04 (×4): 237 mL via ORAL

## 2020-04-03 MED ORDER — HALOPERIDOL LACTATE 5 MG/ML IJ SOLN: 5.0000 mg | Freq: Once | INTRAMUSCULAR | Status: DC

## 2020-04-03 MED ORDER — ALUM & MAG HYDROXIDE-SIMETH 200-200-20 MG/5ML PO SUSP
30.0000 mL | Freq: Once | ORAL | Status: AC
  Administered 2020-04-03: 30 mL via ORAL
  Filled 2020-04-03: qty 30

## 2020-04-03 MED ORDER — ADULT MULTIVITAMIN W/MINERALS CH
1.0000 | ORAL_TABLET | Freq: Every day | ORAL | Status: DC
  Administered 2020-04-03 – 2020-04-05 (×3): 1 via ORAL
  Filled 2020-04-03 (×3): qty 1

## 2020-04-03 NOTE — Progress Notes (Signed)
Initial Nutrition Assessment  DOCUMENTATION CODES:   Severe malnutrition in context of social or environmental circumstances  INTERVENTION:   -Ensure Enlive po TID, each supplement provides 350 kcal and 20 grams of protein  -Multivitamin with minerals daily  NUTRITION DIAGNOSIS:   Severe Malnutrition related to social / environmental circumstances as evidenced by severe fat depletion,severe muscle depletion.  GOAL:   Patient will meet greater than or equal to 90% of their needs  MONITOR:   PO intake,Supplement acceptance,Labs,Weight trends,I & O's  REASON FOR ASSESSMENT:   Malnutrition Screening Tool    ASSESSMENT:   65 y.o. male with medical history significant of CVA, cocaine abuse, HTN, DM2. Presenting with abdominal pain. He's had multiple visit for the same symptoms over the past several week.  Patient was just discharged from [facility] 3/14. S/p bubble study and TEE. Pt continued to have N/V following discharge. Has had abdominal pain for several weeks. Has a history of substance abuse and currently homeless.   Currently consuming 100% of meals. Will order Ensure supplements  Per weight records, last recorded weight is from 3/14- 122 lbs. Severe malnutrition continues.  Medications: colace, Senokot  Labs reviewed: CBGs: 168-252 Low Na UDS+ opiates  NUTRITION - FOCUSED PHYSICAL EXAM:  Flowsheet Row Most Recent Value  Orbital Region Severe depletion  Upper Arm Region Severe depletion  Thoracic and Lumbar Region Severe depletion  Buccal Region Severe depletion  Temple Region Severe depletion  Clavicle Bone Region Severe depletion  Clavicle and Acromion Bone Region Severe depletion  Scapular Bone Region Severe depletion  Dorsal Hand Severe depletion  Patellar Region Severe depletion  Anterior Thigh Region Severe depletion  Posterior Calf Region Severe depletion  Edema (RD Assessment) None       Diet Order:   Diet Order            Diet heart  healthy/carb modified Room service appropriate? Yes; Fluid consistency: Thin  Diet effective now                 EDUCATION NEEDS:   No education needs have been identified at this time  Skin:  Skin Assessment: Reviewed RN Assessment  Last BM:  3/18  Height:   Ht Readings from Last 1 Encounters:  03/30/20 5\' 6"  (1.676 m)    Weight:   Wt Readings from Last 1 Encounters:  03/30/20 55.6 kg    BMI:  19.7 kg/m^2  Estimated Nutritional Needs:   Kcal:  1900-2100  Protein:  95-105g  Fluid:  1.9L/day  04/01/20, MS, RD, LDN Inpatient Clinical Dietitian Contact information available via Amion

## 2020-04-03 NOTE — Progress Notes (Addendum)
[facility]  PROGRESS NOTE  William Acevedo GBT:[id number] DOB: 1955/06/13 DOA: 04/02/2020 PCP: Priscille Kluver, MD   Brief HPI:   65 year old male with medical history of CVA, cocaine abuse, hypertension, diabetes mellitus type 2 presents with 3 weeks history of abdominal pain.  Patient has had multiple visits in the past several weeks for similar complaints.  He was seen in the ED in February and at that time CT scan showed possible renal infarct versus pyelonephritis, patient was sent home on Vicodin.  CTA chest/abdomen/pelvis was negative for dissection but showed hypoperfusion of the inferior right kidney, possible renal infarct.  He has history of cocaine abuse.  Head CT at that time showed stable chronic left occipital lobe infarct.  Further work-up which showed brain MRI with multifocal ischemia within both cerebellar hemispheres, right occipital lobe and both frontal lobes.  TEE with bubble study showed small mitral valve calcification versus vegetation.  Small PFO.  No definite clot.  Venous duplex of lower extremities was negative for DVT.  Patient was discharged on dual antiplatelet therapy Patient came back to the hospital with abdominal pain, CT abdomen/pelvis was negative for acute finding.  Patient was admitted for further work-up. .  Subjective   Patient seen and examined, says abdominal pain has improved.  He also complains of intermittent dysuria and testicular pain since his left testicle was removed in 2017.  Also complains of intermittent dysuria.  UA was clear.   Assessment/Plan:     1. Abdominal pain-unclear etiology, likely GERD.  CT abdomen/pelvis showed no acute abnormality.  Patient has questionable allergy to omeprazole, however patient says that he has taken omeprazole in the past.  Started on Pepcid 20 mg p.o. daily.  Will also give 1 dose of Maalox.  Will assess the response. 2. History of CVA-continue aspirin, statin. 3. Hypertension-secondary to  renal artery stenosis.  Continue metoprolol 50 mg p.o. twice daily. 4. Diabetes mellitus type 2-uncontrolled, hemoglobin A1c is 10.6.  Continue Lantus, sliding scale insulin NovoLog.  CBG fairly well controlled in the hospital. 5. Dysuria-UA is clear, he is afebrile.  Called and discussed with urology, patient may have underlying prostatitis, also has chronic testicle pain after one testicle was removed in 2017.  Initially CT scan showed distended urinary bladder, however bladder scan done today shows 0 cc urine.  Patient can follow-up with Dr. Annabell Howells, urology for further work-up of prostatitis.       COVID-19 Labs  No results for input(s): DDIMER, FERRITIN, LDH, CRP in the last 72 hours.  Lab Results  Component Value Date   SARSCOV2NAA NEGATIVE 04/02/2020   SARSCOV2NAA NEGATIVE 03/25/2020   SARSCOV2NAA NEGATIVE 04/07/2019     Scheduled medications:   . amitriptyline  20 mg Oral QHS  . aspirin EC  81 mg Oral Daily  . atorvastatin  20 mg Oral Daily  . dicyclomine  20 mg Oral TID AC  . docusate sodium  100 mg Oral BID  . famotidine  20 mg Oral Daily  . insulin aspart  0-5 Units Subcutaneous QHS  . insulin aspart  0-9 Units Subcutaneous TID WC  . insulin glargine  15 Units Subcutaneous QHS  . melatonin  3 mg Oral QHS  . metoprolol tartrate  50 mg Oral BID  . senna  1 tablet Oral Daily       Data Reviewed:   CBG: Recent Labs  Lab 03/30/20 1054 03/30/20 1623 04/02/20 2226 04/03/20 0705 04/03/20 1125  GLUCAP 177* 385* 252* 168* 168*  SpO2: 100 %      CBC:  Recent Labs  Lab 04/02/20 0845 04/03/20 0340  WBC 6.6 6.9  HGB 13.6 11.6*  HCT 42.4 36.1*  PLT 369 336  MCV 88.3 87.8  MCH 28.3 28.2  MCHC 32.1 32.1  RDW 12.4 12.4  LYMPHSABS 1.2  --   MONOABS 0.4  --   EOSABS 0.0  --   BASOSABS 0.0  --     Complete metabolic panel:  Recent Labs  Lab 03/27/20 1505 03/28/20 0244 03/29/20 0333 04/02/20 0845 04/02/20 1159 04/03/20 0340  NA 134* 134* 130*  135  --  133*  K 4.4 5.4* 4.2 4.1  --  4.3  CL 97* 97* 94* 94*  --  99  CO2 32 33* 33* 29  --  25  GLUCOSE 150* 335* 348* 245*  --  233*  BUN 12 21 21 13   --  16  CREATININE 0.99 1.31* 1.20 0.83  --  0.92  CALCIUM 8.1* 8.7* 8.4* 9.2  --  8.6*  AST  --   --   --  33  --  23  ALT  --   --   --  32  --  23  ALKPHOS  --   --   --  59  --  47  BILITOT  --   --   --  0.5  --  0.1*  ALBUMIN  --   --   --  3.8  --  3.0*  MG 1.9  --   --   --   --   --   LATICACIDVEN  --   --   --  2.1* 2.1*  --     Recent Labs  Lab 04/02/20 0845  LIPASE 23    Recent Labs  Lab 04/02/20 1421  SARSCOV2NAA NEGATIVE    ------------------------------------------------------------------------------------------------------------------ No results for input(s): CHOL, HDL, LDLCALC, TRIG, CHOLHDL, LDLDIRECT in the last 72 hours.  Lab Results  Component Value Date   HGBA1C 10.6 (H) 03/26/2020   ------------------------------------------------------------------------------------------------------------------   Microbiology  Recent Results (from the past 240 hour(s))  Resp Panel by RT-PCR (Flu A&B, Covid) Nasopharyngeal Swab     Status: None   Collection Time: 03/25/20  2:20 PM   Specimen: Nasopharyngeal Swab; Nasopharyngeal(NP) swabs in vial transport medium  Result Value Ref Range Status   SARS Coronavirus 2 by RT PCR NEGATIVE NEGATIVE Final    Comment: (NOTE) SARS-CoV-2 target nucleic acids are NOT DETECTED.  The SARS-CoV-2 RNA is generally detectable in upper respiratory specimens during the acute phase of infection. The lowest concentration of SARS-CoV-2 viral copies this assay can detect is 138 copies/mL. A negative result does not preclude SARS-Cov-2 infection and should not be used as the sole basis for treatment or other patient management decisions. A negative result may occur with  improper specimen collection/handling, submission of specimen other than nasopharyngeal swab, presence of  viral mutation(s) within the areas targeted by this assay, and inadequate number of viral copies(<138 copies/mL). A negative result must be combined with clinical observations, patient history, and epidemiological information. The expected result is Negative.  Fact Sheet for Patients:  BloggerCourse.comhttps://www.fda.gov/media/152166/download  Fact Sheet for Healthcare Providers:  SeriousBroker.ithttps://www.fda.gov/media/152162/download  This test is no t yet approved or cleared by the [place] FDA and  has been authorized for detection and/or diagnosis of SARS-CoV-2 by FDA under an Emergency Use Authorization (EUA). This EUA will remain  in effect (meaning this test can be used) for the  duration of the COVID-19 declaration under Section 564(b)(1) of the Act, 21 U.S.C.section 360bbb-3(b)(1), unless the authorization is terminated  or revoked sooner.       Influenza A by PCR NEGATIVE NEGATIVE Final   Influenza B by PCR NEGATIVE NEGATIVE Final    Comment: (NOTE) The Xpert Xpress SARS-CoV-2/FLU/RSV plus assay is intended as an aid in the diagnosis of influenza from Nasopharyngeal swab specimens and should not be used as a sole basis for treatment. Nasal washings and aspirates are unacceptable for Xpert Xpress SARS-CoV-2/FLU/RSV testing.  Fact Sheet for Patients: BloggerCourse.com  Fact Sheet for Healthcare Providers: SeriousBroker.it  This test is not yet approved or cleared by the [place] FDA and has been authorized for detection and/or diagnosis of SARS-CoV-2 by FDA under an Emergency Use Authorization (EUA). This EUA will remain in effect (meaning this test can be used) for the duration of the COVID-19 declaration under Section 564(b)(1) of the Act, 21 U.S.C. section 360bbb-3(b)(1), unless the authorization is terminated or revoked.  Performed at [facility], [address] [place], [place] [address]   Culture, blood  (routine x 2)     Status: None   Collection Time: 03/26/20  6:00 PM   Specimen: BLOOD RIGHT ARM  Result Value Ref Range Status   Specimen Description BLOOD RIGHT ARM  Final   Special Requests   Final    BOTTLES DRAWN AEROBIC ONLY Blood Culture adequate volume   Culture   Final    NO GROWTH 5 DAYS Performed at [facility], [address] [place], [place] [address]    Report Status 03/31/2020 FINAL  Final  Culture, blood (routine x 2)     Status: None   Collection Time: 03/26/20  6:05 PM   Specimen: BLOOD RIGHT ARM  Result Value Ref Range Status   Specimen Description BLOOD RIGHT ARM  Final   Special Requests   Final    BOTTLES DRAWN AEROBIC ONLY Blood Culture adequate volume   Culture   Final    NO GROWTH 5 DAYS Performed at [facility], [address], [place], [place] [address]    Report Status 03/31/2020 FINAL  Final  Resp Panel by RT-PCR (Flu A&B, Covid) Nasopharyngeal Swab     Status: None   Collection Time: 04/02/20  2:21 PM   Specimen: Nasopharyngeal Swab; Nasopharyngeal(NP) swabs in vial transport medium  Result Value Ref Range Status   SARS Coronavirus 2 by RT PCR NEGATIVE NEGATIVE Final    Comment: (NOTE) SARS-CoV-2 target nucleic acids are NOT DETECTED.  The SARS-CoV-2 RNA is generally detectable in upper respiratory specimens during the acute phase of infection. The lowest concentration of SARS-CoV-2 viral copies this assay can detect is 138 copies/mL. A negative result does not preclude SARS-Cov-2 infection and should not be used as the sole basis for treatment or other patient management decisions. A negative result may occur with  improper specimen collection/handling, submission of specimen other than nasopharyngeal swab, presence of viral mutation(s) within the areas targeted by this assay, and inadequate number of viral copies(<138 copies/mL). A negative result must be combined with clinical observations, patient history, and  epidemiological information. The expected result is Negative.  Fact Sheet for Patients:  BloggerCourse.com  Fact Sheet for Healthcare Providers:  SeriousBroker.it  This test is no t yet approved or cleared by the [place] FDA and  has been authorized for detection and/or diagnosis of SARS-CoV-2 by FDA under an Emergency Use Authorization (EUA).  This EUA will remain  in effect (meaning this test can be used) for the duration of the COVID-19 declaration under Section 564(b)(1) of the Act, 21 U.S.C.section 360bbb-3(b)(1), unless the authorization is terminated  or revoked sooner.       Influenza A by PCR NEGATIVE NEGATIVE Final   Influenza B by PCR NEGATIVE NEGATIVE Final    Comment: (NOTE) The Xpert Xpress SARS-CoV-2/FLU/RSV plus assay is intended as an aid in the diagnosis of influenza from Nasopharyngeal swab specimens and should not be used as a sole basis for treatment. Nasal washings and aspirates are unacceptable for Xpert Xpress SARS-CoV-2/FLU/RSV testing.  Fact Sheet for Patients: BloggerCourse.com  Fact Sheet for Healthcare Providers: SeriousBroker.it  This test is not yet approved or cleared by the [place] FDA and has been authorized for detection and/or diagnosis of SARS-CoV-2 by FDA under an Emergency Use Authorization (EUA). This EUA will remain in effect (meaning this test can be used) for the duration of the COVID-19 declaration under Section 564(b)(1) of the Act, 21 U.S.C. section 360bbb-3(b)(1), unless the authorization is terminated or revoked.  Performed at [facility], [address], [place], [place] [address]           Radiology Reports  CT Abdomen Pelvis W Contrast  Result Date: 04/02/2020 CLINICAL DATA:  Lower abdominal pain for 1 month. EXAM: CT ABDOMEN AND PELVIS WITH CONTRAST TECHNIQUE: Multidetector CT  imaging of the abdomen and pelvis was performed using the standard protocol following bolus administration of intravenous contrast. CONTRAST:  OMNIPAQUE IOHEXOL 300 MG/ML  SOLN COMPARISON:  03/25/2020 FINDINGS: Lower chest: No acute abnormality. Hepatobiliary: No focal hepatic abnormality. Gallbladder unremarkable. Pancreas: No focal abnormality or ductal dilatation. Spleen: No focal abnormality.  Normal size. Adrenals/Urinary Tract: Areas of cortical thinning and scarring in the mid and lower pole of the right kidney. Small cysts in the upper and mid poles of the right kidney. No hydronephrosis. Adrenal glands unremarkable. Urinary bladder moderately distended, grossly unremarkable otherwise. Stomach/Bowel: Moderate stool burden throughout the colon. Stomach, large and small bowel grossly unremarkable. Normal appendix. Vascular/Lymphatic: Densely calcified aorta and iliac vessels. No evidence of aneurysm or adenopathy. Reproductive: No visible focal abnormality. Other: No free fluid or free air. Musculoskeletal: No acute bony abnormality. IMPRESSION: Heavily calcified aorta and iliac vessels.  No aneurysm. No acute findings in the abdomen or pelvis. Areas of cortical thinning and scarring in the mid and lower pole of the right kidney, stable. Electronically Signed   By: Charlett Nose M.D.   On: 04/02/2020 11:42   DG Chest Port 1 View  Result Date: 04/02/2020 CLINICAL DATA:  Weakness and pain EXAM: PORTABLE CHEST 1 VIEW COMPARISON:  January 18, 2019 chest radiograph; chest CT March 25, 2020 FINDINGS: Lungs are clear. The heart size and pulmonary vascularity are normal. No adenopathy. There is aortic atherosclerosis. No bone lesions. IMPRESSION: Lungs clear. Heart size normal. Aortic Atherosclerosis (ICD10-I70.0). Electronically Signed   By: Bretta Bang III M.D.   On: 04/02/2020 08:55      Antibiotics: Anti-infectives (From admission, onward)   None       DVT prophylaxis: SCDs  Code Status:  Full code  Family Communication: No family at bedside   Consultants:    Procedures:      Objective   Vitals:   04/03/20 0243 04/03/20 0606 04/03/20 1022 04/03/20 1328  BP: (!) 156/89 (!) 146/91 (!) 150/89 119/81  Pulse: 77 75 81 81  Resp: 16 18 17  18  Temp: 98.4 F (36.9 C) 98 F (36.7 C) 98 F (36.7 C) 97.6 F (36.4 C)  TempSrc: Oral Oral Oral Oral  SpO2: 97% 100% 100% 100%    Intake/Output Summary (Last 24 hours) at 04/03/2020 1554 Last data filed at 04/03/2020 1426 Gross per 24 hour  Intake 472 ml  Output -  Net 472 ml    03/16 1901 - 03/18 0700 In: 500  Out: -   There were no vitals filed for this visit.  Physical Examination:   General-appears in no acute distress Heart-S1-S2, regular, no murmur auscultated Lungs-clear to auscultation bilaterally, no wheezing or crackles auscultated Abdomen-soft, nontender, no organomegaly Extremities-no edema in the lower extremities Neuro-alert, oriented x3, no focal deficit noted  Status is: Inpatient  Dispo: The patient is from: Home              Anticipated d/c is to: Home              Anticipated d/c date is: 04/04/2020              Patient currently not stable for discharge  Barrier to discharge-ongoing abdominal pain        Gagan S Lama   [facility] If 7PM-7AM, please contact night-coverage at [email or web address], Office  [phone]   04/03/2020, 3:54 PM  LOS: 1 day

## 2020-04-04 DIAGNOSIS — R112 Nausea with vomiting, unspecified: Secondary | ICD-10-CM

## 2020-04-04 LAB — BASIC METABOLIC PANEL
Anion gap: 11 (ref 5–15)
BUN: 19 mg/dL (ref 8–23)
CO2: 27 mmol/L (ref 22–32)
Calcium: 9.3 mg/dL (ref 8.9–10.3)
Chloride: 96 mmol/L — ABNORMAL LOW (ref 98–111)
Creatinine, Ser: 0.94 mg/dL (ref 0.61–1.24)
GFR, Estimated: >60 mL/min (ref >60)
Glucose, Bld: 252 mg/dL — ABNORMAL HIGH (ref 70–99)
Potassium: 4.6 mmol/L (ref 3.5–5.1)
Sodium: 134 mmol/L — ABNORMAL LOW (ref 135–145)

## 2020-04-04 LAB — GLUCOSE, CAPILLARY
Glucose-Capillary: 310 mg/dL — ABNORMAL HIGH (ref 70–99)
Glucose-Capillary: 218 mg/dL — ABNORMAL HIGH (ref 70–99)
Glucose-Capillary: 168 mg/dL — ABNORMAL HIGH (ref 70–99)
Glucose-Capillary: 346 mg/dL — ABNORMAL HIGH (ref 70–99)

## 2020-04-04 NOTE — Progress Notes (Signed)
[facility]  PROGRESS NOTE  DRAKEN FARRIOR WUJ:[id number] DOB: [birth year] DOA: 04/02/2020 PCP: Priscille Kluver, MD   Brief HPI:   65 year old male with medical history of CVA, cocaine abuse, hypertension, diabetes mellitus type 2 presents with 3 weeks history of abdominal pain.  Patient has had multiple visits in the past several weeks for similar complaints.  He was seen in the ED in February and at that time CT scan showed possible renal infarct versus pyelonephritis, patient was sent home on Vicodin.  CTA chest/abdomen/pelvis was negative for dissection but showed hypoperfusion of the inferior right kidney, possible renal infarct.  He has history of cocaine abuse.  Head CT at that time showed stable chronic left occipital lobe infarct.  Further work-up which showed brain MRI with multifocal ischemia within both cerebellar hemispheres, right occipital lobe and both frontal lobes.  TEE with bubble study showed small mitral valve calcification versus vegetation.  Small PFO.  No definite clot.  Venous duplex of lower extremities was negative for DVT.  Patient was discharged on aspirin. Patient came back to the hospital with abdominal pain, CT abdomen/pelvis was negative for acute finding.  Patient was admitted for further work-up. .  Subjective   Patient seen and examined, complains of epigastric pain.  He also vomited this morning.   Assessment/Plan:     1. Abdominal pain-unclear etiology, likely GERD.  CT abdomen/pelvis showed no acute abnormality.  Patient has questionable allergy to omeprazole, however patient says that he has taken omeprazole in the past.  Started on Pepcid 20 mg p.o. daily.  He continues to have epigastric pain with vomiting.  Will consult GI for possible upper GI endoscopy.  2. History of CVA-continue aspirin, statin. 3. Hypertension-secondary to renal artery stenosis.  Continue metoprolol 50 mg p.o. twice daily. 4. Diabetes mellitus type 2-uncontrolled,  hemoglobin A1c is 10.6.  Continue Lantus, sliding scale insulin NovoLog.  CBG fairly well controlled in the hospital. 5. Dysuria-UA is clear, he is afebrile.  Called and discussed with urology, patient may have underlying prostatitis, also has chronic testicle pain after one testicle was removed in 2017.  Initially CT scan showed distended urinary bladder, however bladder scan done today shows 0 cc urine.  Patient can follow-up with Dr. Annabell Howells, urology for further work-up of prostatitis.          Scheduled medications:   . amitriptyline  20 mg Oral QHS  . aspirin EC  81 mg Oral Daily  . atorvastatin  20 mg Oral Daily  . dicyclomine  20 mg Oral TID AC  . docusate sodium  100 mg Oral BID  . famotidine  20 mg Oral Daily  . feeding supplement  237 mL Oral TID BM  . insulin aspart  0-5 Units Subcutaneous QHS  . insulin aspart  0-9 Units Subcutaneous TID WC  . insulin glargine  15 Units Subcutaneous QHS  . melatonin  3 mg Oral QHS  . metoprolol tartrate  50 mg Oral BID  . multivitamin with minerals  1 tablet Oral Daily  . senna  1 tablet Oral Daily       Data Reviewed:   CBG: Recent Labs  Lab 04/03/20 1125 04/03/20 1614 04/03/20 2136 04/04/20 0732 04/04/20 1312  GLUCAP 168* 119* 372* 310* 218*    SpO2: 100 %      CBC:  Recent Labs  Lab 04/02/20 0845 04/03/20 0340  WBC 6.6 6.9  HGB 13.6 11.6*  HCT 42.4 36.1*  PLT 369 336  MCV 88.3 87.8  MCH 28.3 28.2  MCHC 32.1 32.1  RDW 12.4 12.4  LYMPHSABS 1.2  --   MONOABS 0.4  --   EOSABS 0.0  --   BASOSABS 0.0  --     Complete metabolic panel:  Recent Labs  Lab 03/29/20 0333 04/02/20 0845 04/02/20 1159 04/03/20 0340 04/04/20 0330  NA 130* 135  --  133* 134*  K 4.2 4.1  --  4.3 4.6  CL 94* 94*  --  99 96*  CO2 33* 29  --  25 27  GLUCOSE 348* 245*  --  233* 252*  BUN 21 13  --  16 19  CREATININE 1.20 0.83  --  0.92 0.94  CALCIUM 8.4* 9.2  --  8.6* 9.3  AST  --  33  --  23  --   ALT  --  32  --  23  --    ALKPHOS  --  59  --  47  --   BILITOT  --  0.5  --  0.1*  --   ALBUMIN  --  3.8  --  3.0*  --   LATICACIDVEN  --  2.1* 2.1*  --   --     Recent Labs  Lab 04/02/20 0845  LIPASE 23    Recent Labs  Lab 04/02/20 1421  SARSCOV2NAA NEGATIVE    ------------------------------------------------------------------------------------------------------------------ No results for input(s): CHOL, HDL, LDLCALC, TRIG, CHOLHDL, LDLDIRECT in the last 72 hours.  Lab Results  Component Value Date   HGBA1C 10.6 (H) 03/26/2020   ------------------------------------------------------------------------------------------------------------------   Microbiology  Recent Results (from the past 240 hour(s))  Resp Panel by RT-PCR (Flu A&B, Covid) Nasopharyngeal Swab     Status: None   Collection Time: 03/25/20  2:20 PM   Specimen: Nasopharyngeal Swab; Nasopharyngeal(NP) swabs in vial transport medium  Result Value Ref Range Status   SARS Coronavirus 2 by RT PCR NEGATIVE NEGATIVE Final    Comment: (NOTE) SARS-CoV-2 target nucleic acids are NOT DETECTED.  T      Influenza A by PCR NEGATIVE NEGATIVE Final   Influenza B by PCR NEGATIVE NEGATIVE Final    Comment: (NOTE) The Xpert Xpress SARS-CoV-2/FLU/RSV plus assay is intended as an aid in   Culture, blood (routine x 2)     Status: None   Collection Time: 03/26/20  6:00 PM   Specimen: BLOOD RIGHT ARM  Result Value Ref Range Status   Specimen Description BLOOD RIGHT ARM  Final   Special Requests   Final    BOTTLES DRAWN AEROBIC ONLY Blood Culture adequate volume   Culture   Final    NO GROWTH 5 DAYS Performed at [facility], [address], [place], [place] [address]    Report Status 03/31/2020 FINAL  Final  Culture, blood (routine x 2)     Status: None   Collection Time: 03/26/20  6:05 PM   Specimen: BLOOD RIGHT ARM  Result Value Ref Range Status   Specimen Description BLOOD RIGHT ARM  Final   Special Requests   Final    BOTTLES  DRAWN AEROBIC ONLY Blood Culture adequate volume   Culture   Final    NO GROWTH 5 DAYS Performed at [facility], [address] [place], [place] [address]    Report Status 03/31/2020 FINAL  Final  Resp Panel by RT-PCR (Flu A&B, Covid) Nasopharyngeal Swab     Status: None   Collection Time: 04/02/20  2:21 PM  Specimen: Nasopharyngeal Swab; Nasopharyngeal(NP) swabs in vial transport medium  Result Value Ref Range Status   SARS Coronavirus 2 by RT PCR NEGATIVE NEGATIVE Final    Comment: (NOTE) SARS-CoV-2 target nucleic acids are NOT DETECTED.  T      Influenza A by PCR NEGATIVE NEGATIVE Final   Influenza B by PCR NEGATIVE NEGATIVE Final    Comment: (NOTE) The Xpert Xpress SARS-CoV-2/FLU/RSV plus assay is intended a        Radiology Reports  No results found.    Antibiotics: Anti-infectives (From admission, onward)   None       DVT prophylaxis: SCDs  Code Status: Full code  Family Communication: No family at bedside   Consultants:    Procedures:      Objective   Vitals:   04/03/20 0606 04/03/20 1022 04/03/20 1328 04/03/20 2007  BP: (!) 146/91 (!) 150/89 119/81 132/84  Pulse: 75 81 81 86  Resp: 18 17 18    Temp: 98 F (36.7 C) 98 F (36.7 C) 97.6 F (36.4 C) 99.6 F (37.6 C)  TempSrc: Oral Oral Oral Oral  SpO2: 100% 100% 100% 100%    Intake/Output Summary (Last 24 hours) at 04/04/2020 1406 Last data filed at 04/04/2020 1100 Gross per 24 hour  Intake 472 ml  Output -  Net 472 ml    03/17 1901 - 03/19 0700 In: 708 [P.O.:708] Out: -   There were no vitals filed for this visit.  Physical Examination:   General-appears in no acute distress Heart-S1-S2, regular, no murmur auscultated Lungs-clear to auscultation bilaterally, no wheezing or crackles auscultated Abdomen-soft, nontender, no organomegaly Extremities-no edema in the lower extremities Neuro-alert, oriented x3, no focal deficit noted  Status is: Inpatient  Dispo:  The patient is from: Home              Anticipated d/c is to: Home              Anticipated d/c date is: 3/21 of/2022              Patient currently not stable for discharge   Barrier to discharge-ongoing abdominal pain        Gagan S Lama   [facility] If 7PM-7AM, please contact night-coverage at [email or web address], Office  [phone]   04/04/2020, 2:06 PM  LOS: 2 days

## 2020-04-05 LAB — GLUCOSE, CAPILLARY
Glucose-Capillary: 186 mg/dL — ABNORMAL HIGH (ref 70–99)
Glucose-Capillary: 150 mg/dL — ABNORMAL HIGH (ref 70–99)

## 2020-04-05 MED ORDER — NOVOLOG FLEXPEN 100 UNIT/ML ~~LOC~~ SOPN: 5.0000 [IU] | PEN_INJECTOR | Freq: Three times a day (TID) | SUBCUTANEOUS | 11 refills | Status: DC

## 2020-04-05 MED ORDER — ONDANSETRON HCL 4 MG PO TABS
4.0000 mg | ORAL_TABLET | Freq: Once | ORAL | Status: AC
  Administered 2020-04-05: 4 mg via ORAL
  Filled 2020-04-05: qty 1

## 2020-04-05 MED ORDER — SUCRALFATE 1 G PO TABS: 1.0000 g | ORAL_TABLET | Freq: Two times a day (BID) | ORAL | 0 refills | Status: DC

## 2020-04-05 MED ORDER — DOCUSATE SODIUM 100 MG PO CAPS: 100.0000 mg | ORAL_CAPSULE | Freq: Two times a day (BID) | ORAL | 0 refills | Status: DC

## 2020-04-05 MED ORDER — DICYCLOMINE HCL 20 MG PO TABS: 20.0000 mg | ORAL_TABLET | Freq: Three times a day (TID) | ORAL | 0 refills | Status: DC

## 2020-04-05 MED ORDER — ACETAMINOPHEN 325 MG PO TABS: 650.0000 mg | ORAL_TABLET | Freq: Four times a day (QID) | ORAL | Status: DC | PRN

## 2020-04-05 MED ORDER — METOPROLOL TARTRATE 50 MG PO TABS: 50.0000 mg | ORAL_TABLET | Freq: Two times a day (BID) | ORAL | 2 refills | Status: DC

## 2020-04-05 MED ORDER — FAMOTIDINE 20 MG PO TABS: 20.0000 mg | ORAL_TABLET | Freq: Every day | ORAL | 3 refills | Status: DC

## 2020-04-05 NOTE — Care Management Important Message (Signed)
Important Message  Patient Details  Name: William Acevedo MRN: [id number] Date of Birth: April 05, 1955   Medicare Important Message Given:  Yes     Elliot Cousin, RN 04/05/2020, 2:22 PM

## 2020-04-05 NOTE — Progress Notes (Signed)
Patient discharged with family. His belongings were returned. Education on medication was provided.

## 2020-04-05 NOTE — TOC Initial Note (Signed)
Transition of Care [facility]) - Initial/Assessment Note    Patient Details  Name: William Acevedo MRN: [id number] Date of Birth: 1955-02-09  Transition of Care [facility] CM/SW Contact:    Elliot Cousin, RN Phone Number: [phone] 04/05/2020, 3:32 PM  Clinical Narrative:                  TOC CM spoke to pt at bedside. States his brother will provide transportation. Explained to pt to follow up with his PCP for pain management clinic. States [facility] does not have [facility] stock and message sent to attending. [place] was sent per provider to [facility] and pt's brother will pick up his meds prior to picking up patient.    Expected Discharge Plan: Home/Self Care Barriers to Discharge: No Barriers Identified   Patient Goals and CMS Choice    TOC CM spoke to pt and     Expected Discharge Plan and Services Expected Discharge Plan: Home/Self Care   Discharge Planning Services: CM Consult     Expected Discharge Date: 04/05/20                                    Prior Living Arrangements/Services   Lives with:: Self Patient language and need for interpreter reviewed:: Yes Do you feel safe going back to the place where you live?: Yes      Need for Family Participation in Patient Care: No (Comment) Care giver support system in place?: No (comment)   Criminal Activity/Legal Involvement Pertinent to Current Situation/Hospitalization: No - Comment as needed  Activities of Daily Living Home Assistive Devices/Equipment: Eyeglasses,CBG Meter ADL Screening (condition at time of admission) Patient's cognitive ability adequate to safely complete daily activities?: Yes Is the patient deaf or have difficulty hearing?: No Does the patient have difficulty seeing, even when wearing glasses/contacts?: No Does the patient have difficulty concentrating, remembering, or making decisions?: Yes Patient able to express need for assistance with ADLs?: Yes Does the patient have difficulty  dressing or bathing?: No Independently performs ADLs?: No Communication: Independent Dressing (OT): Independent Grooming: Independent Feeding: Independent Bathing: Independent Toileting: Needs assistance Is this a change from baseline?: Pre-admission baseline In/Out Bed: Needs assistance Is this a change from baseline?: Pre-admission baseline Walks in Home: Needs assistance Is this a change from baseline?: Pre-admission baseline Does the patient have difficulty walking or climbing stairs?: Yes Weakness of Legs: Both Weakness of Arms/Hands: Both  Permission Sought/Granted Permission sought to share information with : Case Manager,PCP,Family Supports Permission granted to share information with : Yes, Verbal Permission Granted  Share Information with NAME: Terris Germano  Permission granted to share info w AGENCY: Pharmacy  Permission granted to share info w Relationship: brother  Permission granted to share info w Contact Information: [phone]  Emotional Assessment Appearance:: Appears stated age Attitude/Demeanor/Rapport: Apprehensive Affect (typically observed): Accepting Orientation: : Oriented to Self,Oriented to Place,Oriented to  Time,Oriented to Situation Alcohol / Substance Use: Tobacco Use,Alcohol Use,Illicit Drugs Psych Involvement: No (comment)  Admission diagnosis:  Generalized abdominal pain [R10.84] Abdominal pain [R10.9] Nausea and vomiting, intractability of vomiting not specified, unspecified vomiting type [R11.2] Patient Active Problem List   Diagnosis Date Noted  . Abdominal pain 04/02/2020  . Protein-calorie malnutrition, severe 03/27/2020  . Cerebral thrombosis with cerebral infarction 03/26/2020  . NSTEMI (non-ST elevated myocardial infarction) (HCC) 03/25/2020  . Secondary diabetes mellitus with HHNC (hyperglycemia hyperosmolar non-ketotic coma) (HCC) 03/25/2020  .  Elevated troponin   . Renal infarct (HCC)   . Cerebrovascular accident (CVA) due  to embolism of cerebral artery (HCC)   . Cocaine use disorder, severe, dependence (HCC) 04/08/2019  . Major depressive disorder 04/08/2019  . Epiglottitis 04/07/2019  . Cocaine use 04/07/2019  . Acute encephalopathy 04/07/2019  . Overdose 07/06/2015  . AKI (acute kidney injury) (HCC) 07/06/2015  . Polysubstance abuse (HCC) 07/06/2015  . DM2 (diabetes mellitus, type 2) (HCC) 07/06/2015  . Dehydration   . MDD (major depressive disorder), recurrent episode, mild (HCC)   . Uncomplicated alcohol dependence (HCC)    PCP:  Priscille Kluver, MD Pharmacy:   [facility] [phone] [address] [address], [place] - [address] [place] [address] Phone: [phone] Fax: [phone]     Social Determinants of Health (SDOH) Interventions    Readmission Risk Interventions No flowsheet data found.

## 2020-04-05 NOTE — Discharge Summary (Signed)
Physician Discharge Summary  William Acevedo:[id number] DOB: 07-08-1955 DOA: 04/02/2020  PCP: Olegario Messier, MD  Admit date: 04/02/2020 Discharge date: 04/05/2020  Time spent: 50 minutes  Recommendations for Outpatient Follow-up:  1. Follow-up gastroenterology in 4 weeks 2. Follow-up urology as outpatient in 4 weeks   Discharge Diagnoses:  Active Problems:   Abdominal pain   Discharge Condition: Stable  Diet recommendation: Heart healthy diet  There were no vitals filed for this visit.  History of present illness:  65 year old male with medical history of CVA, cocaine abuse, hypertension, diabetes mellitus type 2 presents with 3 weeks history of abdominal pain.  Patient has had multiple visits in the past several weeks for similar complaints.  He was seen in the ED in February and at that time CT scan showed possible renal infarct versus pyelonephritis, patient was sent home on Vicodin.  CTA chest/abdomen/pelvis was negative for dissection but showed hypoperfusion of the inferior right kidney, possible renal infarct.  He has history of cocaine abuse.  Head CT at that time showed stable chronic left occipital lobe infarct.  Further work-up which showed brain MRI with multifocal ischemia within both cerebellar hemispheres, right occipital lobe and both frontal lobes.  TEE with bubble study showed small mitral valve calcification versus vegetation.  Small PFO.  No definite clot.  Venous duplex of lower extremities was negative for DVT.  Patient was discharged on aspirin. Patient came back to the hospital with abdominal pain, CT abdomen/pelvis was negative for acute finding.  Patient was admitted for further work-up.  Hospital Course:   1. Abdominal pain-unclear etiology, likely GERD.  CT abdomen/pelvis showed no acute abnormality.  Patient has questionable allergy to omeprazole, however patient says that he has taken omeprazole in the past.  Started on Pepcid 20 mg p.o. daily.     Patient complained of continued epigastric pain despite treatment so GI was consulted.  However gastroenterology feels that patient can be discharged home and follow-up with them as outpatient for outpatient endoscopy and colonoscopy.  Will discharge patient on dicyclomine 20 mg p.o. 3 times daily with meals, Carafate 1 g p.o. twice daily, Pepcid 20 mg p.o. daily.    2. History of CVA-continue aspirin, statin. 3. Hypertension-secondary to renal artery stenosis.  Continue metoprolol 50 mg p.o. twice daily.  Will discontinue Cozaar. 4. Diabetes mellitus type 2-uncontrolled, hemoglobin A1c is 10.6.  Continue Lantus, NovoLog 5 units 3 times daily.  Continue Glucotrol, Metformin.   5. Dysuria-UA is clear, he is afebrile.  Called and discussed with urology, patient may have underlying prostatitis, also has chronic testicle pain after one testicle was removed in 2017.  Initially CT scan showed distended urinary bladder, however bladder scan done today shows 0 cc urine.  Patient can follow-up with Dr. Jeffie Pollock, urology for further work-up of prostatitis.     Procedures:    Consultations: Gastroenterology  Discharge Exam: Vitals:   04/04/20 2030 04/05/20 0509  BP: (!) 168/91 123/78  Pulse: 80 67  Resp: 20 20  Temp: 98.4 F (36.9 C) 98.7 F (37.1 C)  SpO2: 100% 100%    General: Appears in no acute distress Cardiovascular: S1-S2, regular Respiratory: Clear to auscultation bilaterally  Discharge Instructions   Discharge Instructions    Diet - low sodium heart healthy   Complete by: As directed    Increase activity slowly   Complete by: As directed      Allergies as of 04/05/2020      Reactions   Trazodone  Other reaction(s): Priapism   Lisinopril Swelling   angioedema   Omeprazole    Other reaction(s): ANGIOEDEMA OF LIPS, Angioedema of tongue.      Medication List    STOP taking these medications   HYDROcodone-acetaminophen 5-325 MG tablet Commonly known as: NORCO/VICODIN    insulin lispro 100 UNIT/ML KwikPen Commonly known as: HUMALOG   losartan 25 MG tablet Commonly known as: COZAAR     TAKE these medications   acetaminophen 325 MG tablet Commonly known as: TYLENOL Take 2 tablets (650 mg total) by mouth every 6 (six) hours as needed for mild pain (or Fever >/= 101).   amitriptyline 10 MG tablet Commonly known as: ELAVIL Take 20 mg by mouth at bedtime.   aspirin 81 MG EC tablet Take 1 tablet (81 mg total) by mouth daily. Swallow whole.   atorvastatin 20 MG tablet Commonly known as: LIPITOR Take 20 mg by mouth daily.   blood glucose meter kit and supplies Kit Dispense based on patient and insurance preference. Use up to four times daily as directed. (FOR ICD-9 250.00, 250.01).   cyclobenzaprine 5 MG tablet Commonly known as: FLEXERIL Take 1 tablet (5 mg total) by mouth 3 (three) times daily as needed for muscle spasms.   dicyclomine 20 MG tablet Commonly known as: BENTYL Take 1 tablet (20 mg total) by mouth 3 (three) times daily before meals.   docusate sodium 100 MG capsule Commonly known as: COLACE Take 1 capsule (100 mg total) by mouth 2 (two) times daily.   empagliflozin 25 MG Tabs tablet Commonly known as: JARDIANCE Take 25 mg by mouth daily.   famotidine 20 MG tablet Commonly known as: Pepcid Take 1 tablet (20 mg total) by mouth daily.   feeding supplement Liqd Take 237 mLs by mouth 3 (three) times daily between meals.   gabapentin 600 MG tablet Commonly known as: NEURONTIN Take 1,200 mg by mouth at bedtime.   glipiZIDE 10 MG tablet Commonly known as: GLUCOTROL Take 1 tablet (10 mg total) by mouth daily before breakfast. What changed: when to take this   insulin glargine 100 UNIT/ML Solostar Pen Commonly known as: LANTUS Inject 40 Units into the skin daily.   Insulin Pen Needle 29G X 12MM Misc Per instructions   melatonin 3 MG Tabs tablet Take 12 mg by mouth at bedtime.   metoprolol tartrate 50 MG  tablet Commonly known as: LOPRESSOR Take 1 tablet (50 mg total) by mouth 2 (two) times daily.   NovoLOG FlexPen 100 UNIT/ML FlexPen Generic drug: insulin aspart Inject 5 Units into the skin 3 (three) times daily with meals.   polyethylene glycol 17 g packet Commonly known as: MIRALAX / GLYCOLAX Take 17 g by mouth daily as needed for moderate constipation.   sucralfate 1 g tablet Commonly known as: Carafate Take 1 tablet (1 g total) by mouth 2 (two) times daily.   tadalafil 10 MG tablet Commonly known as: CIALIS Take 20 mg by mouth daily as needed for erectile dysfunction.      Allergies  Allergen Reactions  . Trazodone     Other reaction(s): Priapism  . Lisinopril Swelling    angioedema  . Omeprazole     Other reaction(s): ANGIOEDEMA OF LIPS, Angioedema of tongue.    [facility] Follow up.   Specialty: Behavioral Health Why: Please contact for SA support Contact information: [address] [place] [address] [phone]        [facility] Follow up.   Why: Please contact for SA support Contact information: [address] [place] [address] [phone]        [facility] Follow up.   Specialty: Behavioral Health Why: Please follow up for SA support.  Contact information: [place] [address] [phone]                The results of significant diagnostics from this hospitalization (including imaging, microbiology, ancillary and laboratory) are listed below for reference.    Significant Diagnostic Studies: CT ANGIO HEAD W OR WO CONTRAST  Result Date: 03/27/2020 CLINICAL DATA:  Stroke follow-up EXAM: CT ANGIOGRAPHY HEAD AND NECK TECHNIQUE: Multidetector CT imaging of the head and neck was performed using the standard protocol during bolus administration of intravenous contrast. Multiplanar CT image reconstructions and MIPs were obtained to evaluate  the vascular anatomy. Carotid stenosis measurements (when applicable) are obtained utilizing NASCET criteria, using the distal internal carotid diameter as the denominator. CONTRAST:  39m OMNIPAQUE IOHEXOL 350 MG/ML SOLN COMPARISON:  Brain MRI 03/25/2020 FINDINGS: CT HEAD FINDINGS Brain: There is no mass, hemorrhage or extra-axial collection. The size and configuration of the ventricles and extra-axial CSF spaces are normal. Old left occipital lobe infarct. Infarcts characterized on MRI are not clearly visible on CT. Skull: The visualized skull base, calvarium and extracranial soft tissues are normal. Sinuses/Orbits: No fluid levels or advanced mucosal thickening of the visualized paranasal sinuses. No mastoid or middle ear effusion. The orbits are normal. CTA NECK FINDINGS SKELETON: There is no bony spinal canal stenosis. No lytic or blastic lesion. OTHER NECK: Normal pharynx, larynx and major salivary glands. No cervical lymphadenopathy. Unremarkable thyroid gland. UPPER CHEST: No pneumothorax or pleural effusion. No nodules or masses. AORTIC ARCH: There is calcific atherosclerosis of the aortic arch. There is no aneurysm, dissection or hemodynamically significant stenosis of the visualized portion of the aorta. Conventional 3 vessel aortic branching pattern. The visualized proximal subclavian arteries are widely patent. RIGHT CAROTID SYSTEM: No dissection, occlusion or aneurysm. There is mixed density atherosclerosis extending into the proximal ICA, resulting in less than 50% stenosis. LEFT CAROTID SYSTEM: No dissection, occlusion or aneurysm. There is mixed density atherosclerosis extending into the proximal ICA, resulting in less than 50% stenosis. VERTEBRAL ARTERIES: Left dominant configuration. Both origins are clearly patent. There is no dissection, occlusion or flow-limiting stenosis to the skull base (V1-V3 segments). CTA HEAD FINDINGS POSTERIOR CIRCULATION: --Vertebral arteries: Normal V4 segments.  --Inferior cerebellar arteries: Normal. --Basilar artery: Normal. --Superior cerebellar arteries: Normal. --Posterior cerebral arteries (PCA): Normal. ANTERIOR CIRCULATION: --Intracranial internal carotid arteries: Greater than 50% stenosis of the proximal cavernous segment of the left ICA. Approximately 50% stenosis of the mid cavernous right ICA. --Anterior cerebral arteries (ACA): Normal. Hypoplastic right A1 segment, normal variant. --Middle cerebral arteries (MCA): Normal. VENOUS SINUSES: As permitted by contrast timing, patent. ANATOMIC VARIANTS: None Review of the MIP images confirms the above findings. IMPRESSION: 1. No emergent large vessel occlusion. 2. Greater than 50% stenosis of the proximal cavernous segment of the left ICA. 3. Approximately 50% stenosis of the mid cavernous right ICA. Bilateral carotid bifurcation atherosclerosis without hemodynamically significant stenosis by NASCET criteria. Aortic Atherosclerosis (ICD10-I70.0). Electronically Signed   By: KUlyses JarredM.D.   On: 03/27/2020 19:23   CT Head Wo Contrast  Result Date: 03/25/2020 CLINICAL DATA:  Right-sided weakness. EXAM: CT HEAD WITHOUT CONTRAST TECHNIQUE: Contiguous axial images were obtained from the base of the skull through  the vertex without intravenous contrast. COMPARISON:  April 06, 2019 and January 18, 2019 FINDINGS: Brain: No evidence of acute infarction, hemorrhage, hydrocephalus, extra-axial collection or mass lesion/mass effect. A small, stable area of white matter low attenuation is seen within the left occipital lobe. There is no evidence of associated mass effect or midline shift. While this is stable in appearance when compared to the prior plain brain CT, dated April 06, 2019, it does represent a new finding when compared to the earlier exam from January 18, 2019. Vascular: No hyperdense vessel or unexpected calcification. Skull: A chronic left-sided nasal bone fracture is seen. Sinuses/Orbits: No acute finding.  Other: Mild left posterior parietal scalp soft tissue swelling is seen. IMPRESSION: 1. Findings suggestive of a small, stable chronic left occipital lobe infarct. MRI correlation is recommended. 2. No acute intracranial abnormality. Electronically Signed   By: Virgina Norfolk M.D.   On: 03/25/2020 19:27   CT ANGIO NECK W OR WO CONTRAST  Result Date: 03/27/2020 CLINICAL DATA:  Stroke follow-up EXAM: CT ANGIOGRAPHY HEAD AND NECK TECHNIQUE: Multidetector CT imaging of the head and neck was performed using the standard protocol during bolus administration of intravenous contrast. Multiplanar CT image reconstructions and MIPs were obtained to evaluate the vascular anatomy. Carotid stenosis measurements (when applicable) are obtained utilizing NASCET criteria, using the distal internal carotid diameter as the denominator. CONTRAST:  39m OMNIPAQUE IOHEXOL 350 MG/ML SOLN COMPARISON:  Brain MRI 03/25/2020 FINDINGS: CT HEAD FINDINGS Brain: There is no mass, hemorrhage or extra-axial collection. The size and configuration of the ventricles and extra-axial CSF spaces are normal. Old left occipital lobe infarct. Infarcts characterized on MRI are not clearly visible on CT. Skull: The visualized skull base, calvarium and extracranial soft tissues are normal. Sinuses/Orbits: No fluid levels or advanced mucosal thickening of the visualized paranasal sinuses. No mastoid or middle ear effusion. The orbits are normal. CTA NECK FINDINGS SKELETON: There is no bony spinal canal stenosis. No lytic or blastic lesion. OTHER NECK: Normal pharynx, larynx and major salivary glands. No cervical lymphadenopathy. Unremarkable thyroid gland. UPPER CHEST: No pneumothorax or pleural effusion. No nodules or masses. AORTIC ARCH: There is calcific atherosclerosis of the aortic arch. There is no aneurysm, dissection or hemodynamically significant stenosis of the visualized portion of the aorta. Conventional 3 vessel aortic branching pattern. The  visualized proximal subclavian arteries are widely patent. RIGHT CAROTID SYSTEM: No dissection, occlusion or aneurysm. There is mixed density atherosclerosis extending into the proximal ICA, resulting in less than 50% stenosis. LEFT CAROTID SYSTEM: No dissection, occlusion or aneurysm. There is mixed density atherosclerosis extending into the proximal ICA, resulting in less than 50% stenosis. VERTEBRAL ARTERIES: Left dominant configuration. Both origins are clearly patent. There is no dissection, occlusion or flow-limiting stenosis to the skull base (V1-V3 segments). CTA HEAD FINDINGS POSTERIOR CIRCULATION: --Vertebral arteries: Normal V4 segments. --Inferior cerebellar arteries: Normal. --Basilar artery: Normal. --Superior cerebellar arteries: Normal. --Posterior cerebral arteries (PCA): Normal. ANTERIOR CIRCULATION: --Intracranial internal carotid arteries: Greater than 50% stenosis of the proximal cavernous segment of the left ICA. Approximately 50% stenosis of the mid cavernous right ICA. --Anterior cerebral arteries (ACA): Normal. Hypoplastic right A1 segment, normal variant. --Middle cerebral arteries (MCA): Normal. VENOUS SINUSES: As permitted by contrast timing, patent. ANATOMIC VARIANTS: None Review of the MIP images confirms the above findings. IMPRESSION: 1. No emergent large vessel occlusion. 2. Greater than 50% stenosis of the proximal cavernous segment of the left ICA. 3. Approximately 50% stenosis of the mid cavernous right ICA.  Bilateral carotid bifurcation atherosclerosis without hemodynamically significant stenosis by NASCET criteria. Aortic Atherosclerosis (ICD10-I70.0). Electronically Signed   By: Ulyses Jarred M.D.   On: 03/27/2020 19:23   MR BRAIN WO CONTRAST  Result Date: 03/25/2020 CLINICAL DATA:  Right arm weakness EXAM: MRI HEAD WITHOUT CONTRAST TECHNIQUE: Multiplanar, multiecho pulse sequences of the brain and surrounding structures were obtained without intravenous contrast.  COMPARISON:  Head CT 03/25/2020 FINDINGS: Brain: There is multifocal abnormal diffusion restriction within both cerebellar hemispheres, the right occipital lobe and both frontal lobes. No acute or chronic hemorrhage. There is multifocal hyperintense T2-weighted signal within the white matter. Parenchymal volume and CSF spaces are normal. The midline structures are normal. Vascular: Major flow voids are preserved. Skull and upper cervical spine: Normal calvarium and skull base. Visualized upper cervical spine and soft tissues are normal. Sinuses/Orbits:No paranasal sinus fluid levels or advanced mucosal thickening. No mastoid or middle ear effusion. Normal orbits. IMPRESSION: Multifocal acute ischemia within both cerebellar hemispheres, the right occipital lobe and both frontal lobes. No hemorrhage or mass effect. Electronically Signed   By: Ulyses Jarred M.D.   On: 03/25/2020 22:10   CT Abdomen Pelvis W Contrast  Result Date: 04/02/2020 CLINICAL DATA:  Lower abdominal pain for 1 month. EXAM: CT ABDOMEN AND PELVIS WITH CONTRAST TECHNIQUE: Multidetector CT imaging of the abdomen and pelvis was performed using the standard protocol following bolus administration of intravenous contrast. CONTRAST:  130m OMNIPAQUE IOHEXOL 300 MG/ML  SOLN COMPARISON:  03/25/2020 FINDINGS: Lower chest: No acute abnormality. Hepatobiliary: No focal hepatic abnormality. Gallbladder unremarkable. Pancreas: No focal abnormality or ductal dilatation. Spleen: No focal abnormality.  Normal size. Adrenals/Urinary Tract: Areas of cortical thinning and scarring in the mid and lower pole of the right kidney. Small cysts in the upper and mid poles of the right kidney. No hydronephrosis. Adrenal glands unremarkable. Urinary bladder moderately distended, grossly unremarkable otherwise. Stomach/Bowel: Moderate stool burden throughout the colon. Stomach, large and small bowel grossly unremarkable. Normal appendix. Vascular/Lymphatic: Densely  calcified aorta and iliac vessels. No evidence of aneurysm or adenopathy. Reproductive: No visible focal abnormality. Other: No free fluid or free air. Musculoskeletal: No acute bony abnormality. IMPRESSION: Heavily calcified aorta and iliac vessels.  No aneurysm. No acute findings in the abdomen or pelvis. Areas of cortical thinning and scarring in the mid and lower pole of the right kidney, stable. Electronically Signed   By: KRolm BaptiseM.D.   On: 04/02/2020 11:42   CT ABDOMEN PELVIS W CONTRAST  Result Date: 03/15/2020 CLINICAL DATA:  Acute generalized abdominal pain. EXAM: CT ABDOMEN AND PELVIS WITH CONTRAST TECHNIQUE: Multidetector CT imaging of the abdomen and pelvis was performed using the standard protocol following bolus administration of intravenous contrast. CONTRAST:  1082mOMNIPAQUE IOHEXOL 300 MG/ML  SOLN COMPARISON:  March 14, 2020. FINDINGS: Lower chest: No acute abnormality. Hepatobiliary: No focal liver abnormality is seen. No gallstones, gallbladder wall thickening, or biliary dilatation. Pancreas: Unremarkable. No pancreatic ductal dilatation or surrounding inflammatory changes. Spleen: Normal in size without focal abnormality. Adrenals/Urinary Tract: Adrenal glands appear normal. Left kidney appears normal. Small nonobstructive right renal calculus is noted. Right renal cyst is noted. There is again noted decreased perfusion of the lower pole of the right kidney which is concerning for some degree of infarction; pyelonephritis is also concern, but no definite inflammatory changes are noted in this area. No hydronephrosis or renal obstruction is noted. Urinary bladder is unremarkable. Stomach/Bowel: Stomach is within normal limits. Appendix appears normal. No evidence of bowel  wall thickening, distention, or inflammatory changes. Vascular/Lymphatic: Aortic atherosclerosis. No enlarged abdominal or pelvic lymph nodes. Reproductive: Prostate is unremarkable. Other: No abdominal wall hernia  or abnormality. No abdominopelvic ascites. Musculoskeletal: No acute or significant osseous findings. IMPRESSION: 1. There is again noted decreased perfusion of the lower pole of the right kidney which is most concerning for some degree of renal infarction; pyelonephritis may be considered, but no definite inflammatory changes are noted in this area. 2. Small nonobstructive right renal calculus. 3. Aortic atherosclerosis. Aortic Atherosclerosis (ICD10-I70.0). Electronically Signed   By: Marijo Conception M.D.   On: 03/15/2020 17:15   DG Chest Port 1 View  Result Date: 04/02/2020 CLINICAL DATA:  Weakness and pain EXAM: PORTABLE CHEST 1 VIEW COMPARISON:  January 18, 2019 chest radiograph; chest CT March 25, 2020 FINDINGS: Lungs are clear. The heart size and pulmonary vascularity are normal. No adenopathy. There is aortic atherosclerosis. No bone lesions. IMPRESSION: Lungs clear. Heart size normal. Aortic Atherosclerosis (ICD10-I70.0). Electronically Signed   By: Lowella Grip III M.D.   On: 04/02/2020 08:55   ECHOCARDIOGRAM COMPLETE  Result Date: 03/26/2020    ECHOCARDIOGRAM REPORT   Patient Name:   RENTON BERKLEY Date of Exam: 03/26/2020 Medical Rec #:  [id number]           Height:       66.0 in Accession #:    [id number]          Weight:       118.0 lb Date of Birth:  Nov 10, 1955           BSA:          1.598 m Patient Age:    18 years            BP:           122/71 mmHg Patient Gender: M                   HR:           89 bpm. Exam Location:  Inpatient Procedure: 2D Echo, Color Doppler and Cardiac Doppler Indications:    NSTEMI  History:        Patient has no prior history of Echocardiogram examinations.                 Risk Factors:Diabetes, Hypertension, elevated troponin and                 Current Smoker. Polysubstance abuse.  Sonographer:    Dustin Flock Referring Phys: [id number] [place] IMPRESSIONS  1. Left ventricular ejection fraction, by estimation, is 65 to 70%. The left ventricle has  normal function. The left ventricle has no regional wall motion abnormalities. There is mild concentric left ventricular hypertrophy. Left ventricular diastolic parameters are indeterminate.  2. Right ventricular systolic function is normal. The right ventricular size is normal. Tricuspid regurgitation signal is inadequate for assessing PA pressure.  3. The mitral valve is abnormal. Mild mitral valve regurgitation. No evidence of mitral stenosis. Moderate to severe mitral annular calcification.  4. The aortic valve has an indeterminant number of cusps. Aortic valve regurgitation is not visualized.  5. The inferior vena cava is normal in size with greater than 50% respiratory variability, suggesting right atrial pressure of 3 mmHg. Comparison(s): No prior Echocardiogram. Conclusion(s)/Recommendation(s): Normal biventricular function without evidence of hemodynamically significant valvular heart disease. FINDINGS  Left Ventricle: Left ventricular ejection fraction, by estimation, is 65 to 70%. The left ventricle has normal  function. The left ventricle has no regional wall motion abnormalities. The left ventricular internal cavity size was normal in size. There is  mild concentric left ventricular hypertrophy. Left ventricular diastolic parameters are indeterminate. Right Ventricle: The right ventricular size is normal. No increase in right ventricular wall thickness. Right ventricular systolic function is normal. Tricuspid regurgitation signal is inadequate for assessing PA pressure. Left Atrium: Left atrial size was normal in size. Right Atrium: Right atrial size was normal in size. Prominent Eustachian valve. Pericardium: There is no evidence of pericardial effusion. Mitral Valve: The mitral valve is abnormal. Moderate to severe mitral annular calcification. Mild mitral valve regurgitation. No evidence of mitral valve stenosis. Tricuspid Valve: The tricuspid valve is normal in structure. Tricuspid valve  regurgitation is trivial. No evidence of tricuspid stenosis. Aortic Valve: The aortic valve has an indeterminant number of cusps. Aortic valve regurgitation is not visualized. Pulmonic Valve: The pulmonic valve was not well visualized. Pulmonic valve regurgitation is not visualized. Aorta: The aortic root, ascending aorta, aortic arch and descending aorta are all structurally normal, with no evidence of dilitation or obstruction. Venous: The inferior vena cava is normal in size with greater than 50% respiratory variability, suggesting right atrial pressure of 3 mmHg. IAS/Shunts: The atrial septum is grossly normal.  LEFT VENTRICLE PLAX 2D LVIDd:         3.80 cm  Diastology LVIDs:         2.20 cm  LV e' medial:    5.66 cm/s LV PW:         1.10 cm  LV E/e' medial:  13.9 LV IVS:        1.10 cm  LV e' lateral:   6.85 cm/s LVOT diam:     1.80 cm  LV E/e' lateral: 11.5 LV SV:         68 LV SV Index:   42 LVOT Area:     2.54 cm  RIGHT VENTRICLE RV Basal diam:  2.70 cm RV S prime:     11.40 cm/s TAPSE (M-mode): 2.5 cm LEFT ATRIUM             Index       RIGHT ATRIUM          Index LA diam:        2.50 cm 1.56 cm/m  RA Area:     9.23 cm LA Vol (A2C):   45.2 ml 28.28 ml/m RA Volume:   20.00 ml 12.52 ml/m LA Vol (A4C):   30.2 ml 18.90 ml/m LA Biplane Vol: 40.7 ml 25.47 ml/m  AORTIC VALVE LVOT Vmax:   101.00 cm/s LVOT Vmean:  72.300 cm/s LVOT VTI:    0.266 m  AORTA Ao Root diam: 3.10 cm MITRAL VALVE MV Area (PHT): 4.06 cm     SHUNTS MV Decel Time: 187 msec     Systemic VTI:  0.27 m MV E velocity: 78.80 cm/s   Systemic Diam: 1.80 cm MV A velocity: 112.00 cm/s MV E/A ratio:  0.70 Buford Dresser MD Electronically signed by Buford Dresser MD Signature Date/Time: 03/26/2020/2:01:22 PM    Final    ECHO TEE  Result Date: 03/30/2020    TRANSESOPHOGEAL ECHO REPORT   Patient Name:   GEVORG BRUM Date of Exam: 03/30/2020 Medical Rec #:  [id number]           Height:       66.0 in Accession #:    [id number]  Weight:       122.6 lb Date of Birth:  February 27, 1955           BSA:          1.624 m Patient Age:    72 years            BP:           188/90 mmHg Patient Gender: M                   HR:           87 bpm. Exam Location:  Inpatient Procedure: Transesophageal Echo, 3D Echo, Cardiac Doppler, Color Doppler and            Saline Contrast Bubble Study Indications:     Stroke  History:         Patient has prior history of Echocardiogram examinations, most                  recent 03/26/2020. CHF; Risk Factors:Hypertension and Diabetes.  Sonographer:     Bernadene Person RDCS Referring Phys:  [phone] JILL D MCDANIEL Diagnosing Phys: Candee Furbish MD PROCEDURE: After discussion of the risks and benefits of a TEE, an informed consent was obtained from the patient. The transesophogeal probe was passed without difficulty through the esophogus of the patient. Sedation performed by different physician. The patient was monitored while under deep sedation. Anesthestetic sedation was provided intravenously by Anesthesiology: 194.51m of Propofol, 865mof Lidocaine. The patient's vital signs; including heart rate, blood pressure, and oxygen saturation; remained stable throughout the procedure. The patient developed no complications during the procedure. IMPRESSIONS  1. Left ventricular ejection fraction, by estimation, is 60 to 65%. The left ventricle has normal function. The left ventricle has no regional wall motion abnormalities. There is mild left ventricular hypertrophy.  2. Right ventricular systolic function is normal. The right ventricular size is normal.  3. Left atrial size was mildly dilated. No left atrial/left atrial appendage thrombus was detected.  4. There is a mobile echodensity measuring 0.6cm on the atrial surface attached to the mitral annular calcification. Can not exclude vegetation/ healed calcified vegetation. The mitral valve is normal in structure. Mild to moderate mitral valve regurgitation. No evidence of mitral  stenosis.  5. The tricuspid valve is degenerative.  6. The aortic valve is normal in structure. Aortic valve regurgitation is not visualized. No aortic stenosis is present.  7. The inferior vena cava is normal in size with greater than 50% respiratory variability, suggesting right atrial pressure of 3 mmHg.  8. Positive late bubble cross over with Valsalva/pressure on abdomen. Conclusion(s)/Recommendation(s): Findings are concerning for vegetation/infective endocarditis as detailed above. Positive late bubble cross over with Valsalva/pressure on abdomen suggestive of small PFO.  FINDINGS  Left Ventricle: Left ventricular ejection fraction, by estimation, is 60 to 65%. The left ventricle has normal function. The left ventricle has no regional wall motion abnormalities. The left ventricular internal cavity size was normal in size. There is  mild left ventricular hypertrophy. Right Ventricle: The right ventricular size is normal. No increase in right ventricular wall thickness. Right ventricular systolic function is normal. Left Atrium: Left atrial size was mildly dilated. No left atrial/left atrial appendage thrombus was detected. Right Atrium: Right atrial size was normal in size. Pericardium: There is no evidence of pericardial effusion. Mitral Valve: There is a mobile echodensity measuring 0.6cm on the atrial surface attached to the mitral annular calcification. Can not exclude vegetation/ healed  calcified vegetation. The mitral valve is normal in structure. There is moderate thickening  of the mitral valve leaflet(s). Mild to moderate mitral valve regurgitation. No evidence of mitral valve stenosis. Tricuspid Valve: The tricuspid valve is degenerative in appearance. Tricuspid valve regurgitation is not demonstrated. No evidence of tricuspid stenosis. Aortic Valve: The aortic valve is normal in structure. Aortic valve regurgitation is not visualized. No aortic stenosis is present. Pulmonic Valve: The pulmonic valve  was normal in structure. Pulmonic valve regurgitation is not visualized. No evidence of pulmonic stenosis. Aorta: The aortic root is normal in size and structure. Venous: The inferior vena cava is normal in size with greater than 50% respiratory variability, suggesting right atrial pressure of 3 mmHg. IAS/Shunts: No atrial level shunt detected by color flow Doppler. Agitated saline contrast was given intravenously to evaluate for intracardiac shunting. Positive late bubble cross over with Valsalva/pressure on abdomen. Candee Furbish MD Electronically signed by Candee Furbish MD Signature Date/Time: 03/30/2020/1:01:29 PM    Final    CT Angio Chest/Abd/Pel for Dissection W and/or Wo Contrast  Result Date: 03/25/2020 CLINICAL DATA:  Abdominal pain, concern for aortic dissection EXAM: CT ANGIOGRAPHY CHEST, ABDOMEN AND PELVIS TECHNIQUE: Non-contrast CT of the chest was initially obtained. Multidetector CT imaging through the chest, abdomen and pelvis was performed using the standard protocol during bolus administration of intravenous contrast. Multiplanar reconstructed images and MIPs were obtained and reviewed to evaluate the vascular anatomy. CONTRAST:  141m OMNIPAQUE IOHEXOL 350 MG/ML SOLN COMPARISON:  03/14/2020 FINDINGS: CTA CHEST FINDINGS Cardiovascular: Stable caliber of the thoracic aorta with atherosclerotic plaque. No evidence of intramural hematoma or dissection. Noncalcified plaque along the proximal left subclavian causes 50% stenosis. No evidence of central pulmonary embolus. Normal heart size. Coronary artery calcification. No pericardial effusion. Mediastinum/Nodes: No enlarged lymph nodes identified. Thyroid and esophagus are unremarkable. Lungs/Pleura: Mild paraseptal emphysema. No new consolidation. No pleural effusion or pneumothorax. Musculoskeletal: No acute osseous abnormality. Review of the MIP images confirms the above findings. CTA ABDOMEN AND PELVIS FINDINGS VASCULAR Aorta: Normal in caliber.  Diffuse mixed but primarily calcified plaque. No evidence of dissection. As before, there is marked plaque at the bifurcation with luminal narrowing. Celiac: Patent origin with stable minimal narrowing. SMA: Patent origin with stable mild narrowing. Renals: Patent with stable at least moderate narrowing of the left renal artery origin. Patent small bilateral accessory renal arteries. The inferior right accessory renal artery is small in caliber with likely significant origin stenosis. IMA: Patent with stable moderate to marked stenosis at the origin. Inflow: Patent common iliacs with normal caliber and mixed but primarily calcified plaque. Patent internal and external iliacs. Veins: Not well evaluated. Review of the MIP images confirms the above findings. NON-VASCULAR Hepatobiliary: No significant abnormality. Pancreas: Unremarkable. Spleen: Unremarkable Adrenals/Urinary Tract: Adrenals are unremarkable. There is persistent hypoenhancement of the lower pole of the right kidney. Additional small areas of hypoenhancement are present within the cortex in the interpolar region. Punctate nonobstructing calculus of the interpolar right kidney. Bladder is unremarkable. Stomach/Bowel: Stomach is within normal limits. Bowel is normal in caliber. Paucity of mesenteric fat somewhat limits evaluation. Lymphatic: No enlarged lymph nodes identified. Reproductive: Unremarkable. Other: No ascites.  Abdominal wall is unremarkable. Musculoskeletal: No acute osseous abnormality. Review of the MIP images confirms the above findings. IMPRESSION: No substantial change since 03/14/2020. There is no evidence of aortic dissection. Significant atherosclerosis throughout. As before, there is stenosis at the abdominal aorta branch origins and bifurcation. Hypoperfusion of the inferior right kidney likely  with some areas of infarction as before. There is a small caliber accessory right renal artery primarily supplying the lower pole with  high-grade origin stenosis. Electronically Signed   By: Macy Mis M.D.   On: 03/25/2020 16:41   CT Angio Chest/Abd/Pel for Dissection W and/or W/WO  Result Date: 03/14/2020 CLINICAL DATA:  Abdominal pain, suspected aortic dissection. Severe umbilical pain. Possible mesenteric ischemia. Abdominal aortic aneurysm. EXAM: CT ANGIOGRAPHY CHEST, ABDOMEN AND PELVIS TECHNIQUE: Non-contrast CT of the chest was initially obtained. Multidetector CT imaging through the chest, abdomen and pelvis was performed using the standard protocol during bolus administration of intravenous contrast. Multiplanar reconstructed images and MIPs were obtained and reviewed to evaluate the vascular anatomy. CONTRAST:  164m OMNIPAQUE IOHEXOL 350 MG/ML SOLN COMPARISON:  None. FINDINGS: CTA CHEST FINDINGS Cardiovascular: Heart is normal size. There is calcified plaque over the left main, left anterior descending and right coronary arteries. Mild prominence of the ascending thoracic aorta measuring 3.5 cm in diameter. No evidence of aneurysm or dissection involving the thoracic aorta. There is calcified plaque over the thoracic aorta. Common takeoff of the right brachiocephalic and left common carotid arteries. Moderate calcified plaque over the proximal left subclavian artery with mild focal narrowing 3 cm distal to its origin. Pulmonary arterial system is unremarkable. Mediastinum/Nodes: No mediastinal or hilar adenopathy. Remaining mediastinal structures are unremarkable. Lungs/Pleura: Lungs are adequately inflated. No focal airspace consolidation or effusion. Subtle paraseptal emphysema over the apices. Small calcified granuloma over the left lower lobe. Airways are normal. Musculoskeletal: No focal abnormality. Review of the MIP images confirms the above findings. CTA ABDOMEN AND PELVIS FINDINGS VASCULAR Aorta: Abdominal aorta demonstrates no evidence of aneurysm or dissection. There is moderate calcified plaque throughout the  abdominal aorta. There is focal narrowing of the abdominal aorta at the bifurcation. Celiac: Minimal narrowing at the origin of the celiac axis which is otherwise patent. SMA: Moderate calcified plaque at the origin of the superior mesenteric artery which is patent. Renals: Calcified plaque at the origin of the renal arteries bilaterally. Approximately 50% focal stenosis of the origin of the left renal artery with minimal poststenotic dilatation. Single bilateral small accessory renal arteries are also present. IMA: Patent. Inflow: Calcified plaque over the common iliac arteries. Internal and external iliac arteries are patent without focal stenosis or occlusion. Veins: No obvious venous abnormality within the limitations of this arterial phase study. Review of the MIP images confirms the above findings. NON-VASCULAR Hepatobiliary: Liver, gallbladder and biliary tree are normal. Pancreas: Normal. Spleen: Normal. Adrenals/Urinary Tract: Adrenal glands are normal. Kidneys are normal in size. Nonobstructing punctate stone over the lower pole right kidney. Couple small right renal cysts are present. There is moderate decreased perfusion of the lower pole of the right kidney on the arterial phase images with more streaky parenchymal hypodensities on the venous phase images suggesting infection/pyelonephritis. Minimal stranding of the adjacent perinephric fat. Bladder is unremarkable. Stomach/Bowel: Stomach and small bowel are normal. Appendix is not visualized. Mild fecal retention throughout the colon which is otherwise unremarkable. Lymphatic: No adenopathy. Reproductive: Unremarkable. Other: None. Musculoskeletal: No focal abnormality. Review of the MIP images confirms the above findings. IMPRESSION: 1. Changes involving the lower pole of the right kidney as described suggesting pyelonephritis. 2. No evidence of aneurysm or dissection involving the thoracoabdominal aorta. Focal narrowing of the abdominal aorta at the  bifurcation. 3. Approximate 50% focal stenosis of the origin of the left renal artery. Mild narrowing at the origin of the celiac axis and  superior mesenteric artery. 4. Aortic atherosclerosis. 5. No acute findings in the chest. 6. Punctate nonobstructing right renal stone. Couple small right renal cysts. 7. Mild prominence of the ascending thoracic aorta measuring 3.5 cm. Recommend annual imaging followup by CTA or MRA. This recommendation follows 2010 ACCF/AHA/AATS/ACR/ASA/SCA/SCAI/SIR/STS/SVM Guidelines for the Diagnosis and Management of Patients with Thoracic Aortic Disease. Circulation.2010; 121: B017-P102. Aortic aneurysm NOS (ICD10-I71.9). Aortic Atherosclerosis (ICD10-I70.0). Electronically Signed   By: Marin Olp M.D.   On: 03/14/2020 14:55   VAS [place] LOWER EXTREMITY VENOUS (DVT)  Result Date: 03/31/2020  Lower Venous DVT Study Indications: Stroke. Other Indications: Evidence of small PFO per TEE. Comparison Study: No prior studies. Performing Technologist: Darlin Coco RDMS,RVT  Examination Guidelines: A complete evaluation includes B-mode imaging, spectral Doppler, color Doppler, and power Doppler as needed of all accessible portions of each vessel. Bilateral testing is considered an integral part of a complete examination. Limited examinations for reoccurring indications may be performed as noted. The reflux portion of the exam is performed with the patient in reverse Trendelenburg.  +---------+---------------+---------+-----------+----------+--------------+ RIGHT    CompressibilityPhasicitySpontaneityPropertiesThrombus Aging +---------+---------------+---------+-----------+----------+--------------+ CFV      Full           Yes      Yes                                 +---------+---------------+---------+-----------+----------+--------------+ SFJ      Full                                                         +---------+---------------+---------+-----------+----------+--------------+ FV Prox  Full                                                        +---------+---------------+---------+-----------+----------+--------------+ FV Mid   Full                                                        +---------+---------------+---------+-----------+----------+--------------+ FV DistalFull                                                        +---------+---------------+---------+-----------+----------+--------------+ PFV      Full                                                        +---------+---------------+---------+-----------+----------+--------------+ POP      Full           Yes      Yes                                 +---------+---------------+---------+-----------+----------+--------------+  PTV      Full                                                        +---------+---------------+---------+-----------+----------+--------------+ PERO     Full                                                        +---------+---------------+---------+-----------+----------+--------------+   +---------+---------------+---------+-----------+----------+--------------+ LEFT     CompressibilityPhasicitySpontaneityPropertiesThrombus Aging +---------+---------------+---------+-----------+----------+--------------+ CFV      Full           Yes      Yes                                 +---------+---------------+---------+-----------+----------+--------------+ SFJ      Full                                                        +---------+---------------+---------+-----------+----------+--------------+ FV Prox  Full                                                        +---------+---------------+---------+-----------+----------+--------------+ FV Mid   Full                                                         +---------+---------------+---------+-----------+----------+--------------+ FV DistalFull                                                        +---------+---------------+---------+-----------+----------+--------------+ PFV      Full                                                        +---------+---------------+---------+-----------+----------+--------------+ POP      Full           Yes      Yes                                 +---------+---------------+---------+-----------+----------+--------------+ PTV      Full                                                        +---------+---------------+---------+-----------+----------+--------------+  PERO     Full                                                        +---------+---------------+---------+-----------+----------+--------------+     Summary: RIGHT: - There is no evidence of deep vein thrombosis in the lower extremity.  - No cystic structure found in the popliteal fossa.  LEFT: - There is no evidence of deep vein thrombosis in the lower extremity.  - No cystic structure found in the popliteal fossa.  *See table(s) above for measurements and observations. Electronically signed by Deitra Mayo MD on 03/31/2020 at 12:36:53 PM.    Final     Microbiology: Recent Results (from the past 240 hour(s))  Culture, blood (routine x 2)     Status: None   Collection Time: 03/26/20  6:00 PM   Specimen: BLOOD RIGHT ARM  Result Value Ref Range Status   Specimen Description BLOOD RIGHT ARM  Final   Special Requests   Final    BOTTLES DRAWN AEROBIC ONLY Blood Culture adequate volume   Culture   Final    NO GROWTH 5 DAYS Performed at [facility], [address] [place], [place] [address]    Report Status 03/31/2020 FINAL  Final  Culture, blood (routine x 2)     Status: None   Collection Time: 03/26/20  6:05 PM   Specimen: BLOOD RIGHT ARM  Result Value Ref Range Status   Specimen Description BLOOD RIGHT ARM   Final   Special Requests   Final    BOTTLES DRAWN AEROBIC ONLY Blood Culture adequate volume   Culture   Final    NO GROWTH 5 DAYS Performed at [facility], [place] [address] [place],  [address]    Report Status 03/31/2020 FINAL  Final  Resp Panel by RT-PCR (Flu A&B, Covid) Nasopharyngeal Swab     Status: None   Collection Time: 04/02/20  2:21 PM   Specimen: Nasopharyngeal Swab; Nasopharyngeal(NP) swabs in vial transport medium  Result Value Ref Range Status   SARS Coronavirus 2 by RT PCR NEGATIVE NEGATIVE Final    Comment: (NOTE) SARS-CoV-2 target nucleic acids are NOT DETECTED.  The SARS-CoV-2 RNA is generally detectable in upper respiratory specimens during the acute phase of infection. The lowest concentration of SARS-CoV-2 viral copies this assay can detect is 138 copies/mL. A negative result does not preclude SARS-Cov-2 infection and should not be used as the sole basis for treatment or other patient management decisions. A negative result may occur with  improper specimen collection/handling, submission of specimen other than nasopharyngeal swab, presence of viral mutation(s) within the areas targeted by this assay, and inadequate number of viral copies(<138 copies/mL). A negative result must be combined with clinical observations, patient history, and epidemiological information. The expected result is Negative.  Fact Sheet for Patients:  EntrepreneurPulse.com.au  Fact Sheet for Healthcare Providers:  IncredibleEmployment.be  This test is no t yet approved or cleared by the [place] FDA and  has been authorized for detection and/or diagnosis of SARS-CoV-2 by FDA under an Emergency Use Authorization (EUA). This EUA will remain  in effect (meaning this test can be used) for the duration of the COVID-19 declaration under Section 564(b)(1) of the Act, 21 U.S.C.section 360bbb-3(b)(1), unless the authorization is terminated   or revoked  sooner.       Influenza A by PCR NEGATIVE NEGATIVE Final   Influenza B by PCR NEGATIVE NEGATIVE Final    Comment: (NOTE) The Xpert Xpress SARS-CoV-2/FLU/RSV plus assay is intended as an aid in the diagnosis of influenza from Nasopharyngeal swab specimens and should not be used as a sole basis for treatment. Nasal washings and aspirates are unacceptable for Xpert Xpress SARS-CoV-2/FLU/RSV testing.  Fact Sheet for Patients: EntrepreneurPulse.com.au  Fact Sheet for Healthcare Providers: IncredibleEmployment.be  This test is not yet approved or cleared by the [place] FDA and has been authorized for detection and/or diagnosis of SARS-CoV-2 by FDA under an Emergency Use Authorization (EUA). This EUA will remain in effect (meaning this test can be used) for the duration of the COVID-19 declaration under Section 564(b)(1) of the Act, 21 U.S.C. section 360bbb-3(b)(1), unless the authorization is terminated or revoked.  Performed at [facility], [place] [address] [place], [place] [address]      Labs: Basic Metabolic Panel: Recent Labs  Lab 04/02/20 0845 04/03/20 0340 04/04/20 0330  NA 135 133* 134*  K 4.1 4.3 4.6  CL 94* 99 96*  CO2 _0 GLUCOSE 245* 233* 252*  BUN _1 CREATININE 0.83 0.92 0.94  CALCIUM 9.2 8.6* 9.3   Liver Function Tests: Recent Labs  Lab 04/02/20 0845 04/03/20 0340  AST 33 23  ALT 32 23  ALKPHOS 59 47  BILITOT 0.5 0.1*  PROT 7.9 6.3*  ALBUMIN 3.8 3.0*   Recent Labs  Lab 04/02/20 0845  LIPASE 23   CBC: Recent Labs  Lab 04/02/20 0845 04/03/20 0340  WBC 6.6 6.9  NEUTROABS 5.0  --   HGB 13.6 11.6*  HCT 42.4 36.1*  MCV 88.3 87.8  PLT 369 336    CBG: Recent Labs  Lab 04/04/20 1312 04/04/20 1637 04/04/20 2033 04/05/20 0750 04/05/20 1123  GLUCAP 218* 168* 346* 186* 150*       Signed:  Oswald Hillock MD.  [facility] 04/05/2020, 11:34  AM

## 2020-04-05 NOTE — TOC Progression Note (Signed)
Transition of Care [facility]) - Progression Note    Patient Details  Name: William Acevedo MRN: [id number] Date of Birth: 11/21/1955  [facility] of [facility]) CM/SW Contact  Coralyn Helling, [place] Phone Number: 04/05/2020, 10:43 AM  Clinical Narrative:   [facility] consulted for SA resources. LCSW added resources to patient AVS. Patient has had treatment in the past and knows how to access the [place] for rehab treatment.   TOC signing off.         Expected Discharge Plan and Services                                                 Social Determinants of Health (SDOH) Interventions    Readmission Risk Interventions No flowsheet data found.

## 2020-04-05 NOTE — Consult Note (Signed)
Referring Provider: [facility] Primary Care Physician:  Olegario Messier, MD Primary Gastroenterologist: Althia Forts  Reason for Consultation: Abdominal pain  HPI: William Acevedo is a 65 y.o. male with past medical history of CVA, history of cocaine abuse, hypertension and diabetes presented to the emergency room with abdominal pain on April 02, 2020.  Initial blood work showed normal CBC, normal LFTs and normal lipase.  Mild elevated lactic acid 2.1.  Urine drug screen positive for opiates.  Urine drug screen was positive for cocaine and THC 10 days ago.  CT abdomen pelvis with contrast showed heavily calcified aorta and iliac vessels but otherwise no acute changes.  Patient seen and examined at bedside.  He is complaining of periumbilical abdominal pain radiating towards bilateral lower quadrant as well as epigastric area since 2017 when he had a testicular surgery.  Pain is constant in nature but get worse with certain foods.  Usually last for few hours complaining of diarrhea alternating with constipation.  Denies any blood in the stool or black stool.  Denies trouble swallowing or pain while swallowing.  Complaining of acid reflux and intermittent nausea and vomiting.  No previous EGD or colonoscopy.  No family history of colon cancer  Past Medical History:  Diagnosis Date  . Carpal tunnel syndrome   . Depression   . Diabetes mellitus without complication ([place])   . Neuropathy     Past Surgical History:  Procedure Laterality Date  . BUBBLE STUDY  03/30/2020   Procedure: BUBBLE STUDY;  Surgeon: Jerline Pain, MD;  Location: [place];  Service: Cardiovascular;;  . TEE WITHOUT CARDIOVERSION N/A 03/30/2020   Procedure: TRANSESOPHAGEAL ECHOCARDIOGRAM (TEE);  Surgeon: Jerline Pain, MD;  Location: [facility] ENDOSCOPY;  Service: Cardiovascular;  Laterality: N/A;  . TESTICLE TORSION REDUCTION      Prior to Admission medications   Medication Sig Start Date End Date Taking? Authorizing Provider   amitriptyline (ELAVIL) 10 MG tablet Take 20 mg by mouth at bedtime.   Yes [provider]  atorvastatin (LIPITOR) 20 MG tablet Take 20 mg by mouth daily.   Yes [provider]  gabapentin (NEURONTIN) 600 MG tablet Take 1,200 mg by mouth at bedtime.   Yes [provider]  glipiZIDE (GLUCOTROL) 10 MG tablet Take 1 tablet (10 mg total) by mouth daily before breakfast. Patient taking differently: Take 10 mg by mouth daily. 08/24/19  Yes [name], PA-C  insulin lispro (HUMALOG) 100 UNIT/ML KwikPen Inject 0-30 Units into the skin 3 (three) times daily.   Yes [provider]  losartan (COZAAR) 25 MG tablet Take 25 mg by mouth daily.   Yes [provider]  melatonin 3 MG TABS tablet Take 12 mg by mouth at bedtime.   Yes [provider]  aspirin EC 81 MG EC tablet Take 1 tablet (81 mg total) by mouth daily. Swallow whole. 03/31/20 04/30/20  Arrien, Jimmy Picket, MD  blood glucose meter kit and supplies KIT Dispense based on patient and insurance preference. Use up to four times daily as directed. (FOR ICD-9 250.00, 250.01). 09/13/19   Isla Pence, MD  cyclobenzaprine (FLEXERIL) 5 MG tablet Take 1 tablet (5 mg total) by mouth 3 (three) times daily as needed for muscle spasms. 03/30/20   Arrien, Jimmy Picket, MD  empagliflozin (JARDIANCE) 25 MG TABS tablet Take 25 mg by mouth daily.    [provider]  famotidine (PEPCID) 20 MG tablet Take 1 tablet (20 mg total) by mouth daily. 03/31/20 04/30/20  Arrien, Jimmy Picket, MD  feeding supplement (ENSURE ENLIVE / ENSURE PLUS) LIQD Take 237 mLs by mouth 3 (three) times daily between meals. 03/30/20 04/29/20  Arrien, Jimmy Picket, MD  HYDROcodone-acetaminophen (NORCO/VICODIN) 5-325 MG tablet Take 1 tablet by mouth every 6 (six) hours as needed for severe pain. 03/30/20   Arrien, Jimmy Picket, MD  insulin aspart (NOVOLOG FLEXPEN) 100 UNIT/ML FlexPen Inject 5 Units into the skin 3 (three)  times daily with meals. Patient not taking: Reported on 04/02/2020 04/08/19   [name], MD  insulin glargine (LANTUS) 100 UNIT/ML Solostar Pen Inject 40 Units into the skin daily. 08/24/19   [name], PA-C  Insulin Pen Needle 29G X 12MM MISC Per instructions 04/08/19   [name], MD  polyethylene glycol (MIRALAX / GLYCOLAX) 17 g packet Take 17 g by mouth daily as needed for moderate constipation. 03/30/20   Arrien, Jimmy Picket, MD  tadalafil (CIALIS) 10 MG tablet Take 20 mg by mouth daily as needed for erectile dysfunction.    [provider]    Scheduled Meds: . amitriptyline  20 mg Oral QHS  . aspirin EC  81 mg Oral Daily  . atorvastatin  20 mg Oral Daily  . dicyclomine  20 mg Oral TID AC  . docusate sodium  100 mg Oral BID  . famotidine  20 mg Oral Daily  . feeding supplement  237 mL Oral TID BM  . insulin aspart  0-5 Units Subcutaneous QHS  . insulin aspart  0-9 Units Subcutaneous TID WC  . insulin glargine  15 Units Subcutaneous QHS  . melatonin  3 mg Oral QHS  . metoprolol tartrate  50 mg Oral BID  . multivitamin with minerals  1 tablet Oral Daily  . senna  1 tablet Oral Daily   Continuous Infusions: PRN Meds:.acetaminophen **OR** acetaminophen, cyclobenzaprine, hydrALAZINE, HYDROcodone-acetaminophen, polyethylene glycol, prochlorperazine  Allergies as of 04/02/2020 - Review Complete 04/02/2020  Allergen Reaction Noted  . Trazodone  07/30/2002  . Lisinopril Swelling 04/07/2019  . Omeprazole  04/13/2009    No family history on file.  Social History   Socioeconomic History  . Marital status: Married    Spouse name: Not on file  . Number of children: Not on file  . Years of education: Not on file  . Highest education level: Not on file  Occupational History  . Not on file  Tobacco Use  . Smoking status: Current Every Day Smoker    Packs/day: 0.50    Types: Cigarettes  . Smokeless tobacco: Never Used  Substance and Sexual Activity   . Alcohol use: Yes  . Drug use: Yes    Types: Cocaine  . Sexual activity: Not on file  Other Topics Concern  . Not on file  Social History Narrative  . Not on file   Social Determinants of Health   Financial Resource Strain: Not on file  Food Insecurity: Not on file  Transportation Needs: Not on file  Physical Activity: Not on file  Stress: Not on file  Social Connections: Not on file  Intimate Partner Violence: Not on file    Review of Systems: 12 point review of system is done which is negative except as mentioned in HPI.  Physical Exam: Vital signs: Vitals:   04/04/20 2030 04/05/20 0509  BP: (!) 168/91 123/78  Pulse: 80 67  Resp: 20 20  Temp: 98.4 F (36.9 C) 98.7 F (37.1 C)  SpO2: 100% 100%   Last BM Date: 04/03/20  Physical Exam Vitals reviewed.  Constitutional:      General: He is not in acute distress.    Appearance: He is well-developed. He is not ill-appearing.  HENT:     Head: Normocephalic and atraumatic.  Eyes:     General: No scleral icterus.    Extraocular Movements: Extraocular movements intact.  Cardiovascular:     Rate and Rhythm: Normal rate and regular rhythm.     Heart sounds: Normal heart sounds.  Pulmonary:     Effort: Pulmonary effort is normal. No respiratory distress.     Breath sounds: Normal breath sounds.  Abdominal:     General: Abdomen is flat. Bowel sounds are normal. There is no distension.     Palpations: Abdomen is soft.     Tenderness: There is generalized abdominal tenderness and tenderness in the epigastric area and periumbilical area.     Hernia: No hernia is present.  Skin:    General: Skin is warm.     Coloration: Skin is not jaundiced.  Neurological:     Mental Status: He is alert and oriented to person, place, and time.  Psychiatric:        Mood and Affect: Mood normal. Mood is not anxious.        Behavior: Behavior normal.     GI:  Lab Results: Recent Labs    04/03/20 0340  WBC 6.9  HGB 11.6*  HCT  36.1*  PLT 336   BMET Recent Labs    04/03/20 0340 04/04/20 0330  NA 133* 134*  K 4.3 4.6  CL 99 96*  CO2 25 27  GLUCOSE 233* 252*  BUN 16 19  CREATININE 0.92 0.94  CALCIUM 8.6* 9.3   LFT Recent Labs    04/03/20 0340  PROT 6.3*  ALBUMIN 3.0*  AST 23  ALT 23  ALKPHOS 47  BILITOT 0.1*   PT/INR No results for input(s): LABPROT, INR in the last 72 hours.   Studies/Results: No results found.  Impression/Plan: -Chronic abdominal pain since 2017.  Periumbilical pain radiating towards suprapubic as well as epigastric area.  CT scan negative for acute changes but showed arthrosclerosis of aorta and iliac arteries.  His chronic abdominal pain could be from chronic mesenteric ischemia or from irritable bowel syndrome. -Acid reflux. -Diarrhea alternating with constipation -Intermittent nausea and vomiting.  Could be from marijuana use. -History of polysubstance abuse with cocaine and marijuana.  Recommendations -------------------------- -Patient is somewhat feeling better.  Tolerating diet at this time.  Denied any  vomiting in last 24 hours. -Continue Pepcid for 4 weeks.  Okay to add Carafate 1 g twice a day for 4 weeks -Continue dicyclomine -Recommend outpatient work-up with EGD and colonoscopy because he never had one. -No further inpatient GI work-up planned.  GI will sign off.  Call us back if needed  Otis Brace MD, [place] 04/05/2020, 11:02 AM  Contact #  [phone]    LOS: 3 days   Otis Brace  MD, [place] 04/05/2020, 10:24 AM  Contact #  [phone]

## 2020-04-05 NOTE — Progress Notes (Signed)
Kindly inform the patient that lower extremity venous Dopplers were normal without evidence of any blood clots noted

## 2020-04-07 ENCOUNTER — Emergency Department (HOSPITAL_COMMUNITY)
Admission: EM | Admit: 2020-04-07 | Discharge: 2020-04-07 | Disposition: A | Discharge status: Home / Self Care | Payer: No Typology Code available for payment source | Attending: Emergency Medicine | Admitting: Emergency Medicine

## 2020-04-07 ENCOUNTER — Other Ambulatory Visit: Payer: Self-pay

## 2020-04-07 DIAGNOSIS — Z794 Long term (current) use of insulin: Secondary | ICD-10-CM | POA: Insufficient documentation

## 2020-04-07 DIAGNOSIS — Z7984 Long term (current) use of oral hypoglycemic drugs: Secondary | ICD-10-CM | POA: Diagnosis not present

## 2020-04-07 DIAGNOSIS — F17210 Nicotine dependence, cigarettes, uncomplicated: Secondary | ICD-10-CM | POA: Diagnosis not present

## 2020-04-07 DIAGNOSIS — R1084 Generalized abdominal pain: Secondary | ICD-10-CM | POA: Diagnosis not present

## 2020-04-07 DIAGNOSIS — Z7982 Long term (current) use of aspirin: Secondary | ICD-10-CM | POA: Insufficient documentation

## 2020-04-07 DIAGNOSIS — R109 Unspecified abdominal pain: Secondary | ICD-10-CM | POA: Diagnosis present

## 2020-04-07 DIAGNOSIS — R112 Nausea with vomiting, unspecified: Secondary | ICD-10-CM | POA: Insufficient documentation

## 2020-04-07 DIAGNOSIS — E119 Type 2 diabetes mellitus without complications: Secondary | ICD-10-CM | POA: Insufficient documentation

## 2020-04-07 LAB — URINALYSIS, ROUTINE W REFLEX MICROSCOPIC
Bacteria, UA: NONE SEEN
Bilirubin Urine: NEGATIVE
Glucose, UA: >=500 mg/dL — AB
Hgb urine dipstick: NEGATIVE
Ketones, ur: 20 mg/dL — AB
Leukocytes,Ua: NEGATIVE
Nitrite: NEGATIVE
Protein, ur: 100 mg/dL — AB
Specific Gravity, Urine: 1.029 (ref 1.005–1.030)
pH: 6 (ref 5.0–8.0)

## 2020-04-07 LAB — CBC
HCT: 44 % (ref 39.0–52.0)
Hemoglobin: 14.3 g/dL (ref 13.0–17.0)
MCH: 28 pg (ref 26.0–34.0)
MCHC: 32.5 g/dL (ref 30.0–36.0)
MCV: 86.1 fL (ref 80.0–100.0)
Platelets: 427 10*3/uL — ABNORMAL HIGH (ref 150–400)
RBC: 5.11 MIL/uL (ref 4.22–5.81)
RDW: 12.3 % (ref 11.5–15.5)
WBC: 8.5 10*3/uL (ref 4.0–10.5)
nRBC: 0 % (ref 0.0–0.2)

## 2020-04-07 LAB — RAPID URINE DRUG SCREEN, HOSP PERFORMED
Amphetamines: NOT DETECTED
Barbiturates: NOT DETECTED
Benzodiazepines: NOT DETECTED
Cocaine: NOT DETECTED
Opiates: POSITIVE — AB
Tetrahydrocannabinol: NOT DETECTED

## 2020-04-07 LAB — COMPREHENSIVE METABOLIC PANEL
ALT: 23 U/L (ref 0–44)
AST: 20 U/L (ref 15–41)
Albumin: 3.7 g/dL (ref 3.5–5.0)
Alkaline Phosphatase: 64 U/L (ref 38–126)
Anion gap: 12 (ref 5–15)
BUN: 26 mg/dL — ABNORMAL HIGH (ref 8–23)
CO2: 27 mmol/L (ref 22–32)
Calcium: 9.3 mg/dL (ref 8.9–10.3)
Chloride: 89 mmol/L — ABNORMAL LOW (ref 98–111)
Creatinine, Ser: 1.13 mg/dL (ref 0.61–1.24)
GFR, Estimated: >60 mL/min (ref >60)
Glucose, Bld: 382 mg/dL — ABNORMAL HIGH (ref 70–99)
Potassium: 4.9 mmol/L (ref 3.5–5.1)
Sodium: 128 mmol/L — ABNORMAL LOW (ref 135–145)
Total Bilirubin: 0.7 mg/dL (ref 0.3–1.2)
Total Protein: 7.9 g/dL (ref 6.5–8.1)

## 2020-04-07 LAB — TSH: TSH: 1.325 u[IU]/mL (ref 0.350–4.500)

## 2020-04-07 LAB — LIPASE, BLOOD: Lipase: 24 U/L (ref 11–51)

## 2020-04-07 MED ORDER — FAMOTIDINE IN NACL 20-0.9 MG/50ML-% IV SOLN
20.0000 mg | Freq: Once | INTRAVENOUS | Status: AC
  Administered 2020-04-07: 20 mg via INTRAVENOUS
  Filled 2020-04-07: qty 50

## 2020-04-07 MED ORDER — MORPHINE SULFATE (PF) 4 MG/ML IV SOLN
4.0000 mg | Freq: Once | INTRAVENOUS | Status: AC
Start: 2020-04-07 — End: 2020-04-07
  Administered 2020-04-07: 4 mg via INTRAVENOUS
  Filled 2020-04-07: qty 1

## 2020-04-07 MED ORDER — SODIUM CHLORIDE 0.9 % IV BOLUS
1000.0000 mL | Freq: Once | INTRAVENOUS | Status: AC
  Administered 2020-04-07: 1000 mL via INTRAVENOUS

## 2020-04-07 MED ORDER — HALOPERIDOL LACTATE 5 MG/ML IJ SOLN
2.0000 mg | Freq: Once | INTRAMUSCULAR | Status: AC
  Administered 2020-04-07: 2 mg via INTRAVENOUS
  Filled 2020-04-07: qty 1

## 2020-04-07 MED ORDER — DICYCLOMINE HCL 20 MG PO TABS: 20.0000 mg | ORAL_TABLET | Freq: Three times a day (TID) | ORAL | 0 refills | Status: DC

## 2020-04-07 NOTE — ED Notes (Signed)
Patient Alert and oriented to baseline. Stable and ambulatory to baseline. Patient verbalized understanding of the discharge instructions.  Patient belongings were taken by the patient.   

## 2020-04-07 NOTE — ED Triage Notes (Signed)
c/o abdominal pain x 5 moths. + NV, stated last BM 2 days ago.

## 2020-04-07 NOTE — ED Provider Notes (Signed)
[place] EMERGENCY DEPARTMENT Provider Note   CSN: [id number] Arrival date & time: 04/07/20  6283     History Chief Complaint  Patient presents with  . Abdominal Pain    William Acevedo is a 65 y.o. male.  HPI Patient is a 65 year old male with past medical history of chronic abdominal pain, DM which is poorly controlled, cutaneous, polysubstance abuse, severe dehydration, alcohol dependence   Patient is presented today for 5 months of abdominal pain.  He also endorses nausea and vomiting.  States that his last bowel movement was 2 days ago.  He states that he was seen at [facility] yesterday and had CT scanning and was sent home.  He states that he is primarily here because he is nauseous and vomiting.  He denies any fevers or chills.  Denies any recent substance use.  Denies any lightheadedness or dizziness.  No chest pain or shortness of breath.  He states that his abdominal pain is epigastric and 6/10.  States it seems to be worse when he is moving around.  No other associated symptoms, no aggravating mitigating factors apart from as discussed above.    Past Medical History:  Diagnosis Date  . Carpal tunnel syndrome   . Depression   . Diabetes mellitus without complication ([place])   . Neuropathy     Patient Active Problem List   Diagnosis Date Noted  . Abdominal pain 04/02/2020  . Protein-calorie malnutrition, severe 03/27/2020  . Cerebral thrombosis with cerebral infarction 03/26/2020  . NSTEMI (non-ST elevated myocardial infarction) ([place]) 03/25/2020  . Secondary diabetes mellitus with HHNC (hyperglycemia hyperosmolar non-ketotic coma) ([place]) 03/25/2020  . Elevated troponin   . Renal infarct ([place])   . Cerebrovascular accident (CVA) due to embolism of cerebral artery ([place])   . Cocaine use disorder, severe, dependence ([place]) 04/08/2019  . Major depressive disorder 04/08/2019  . Epiglottitis 04/07/2019  . Cocaine use 04/07/2019  . Acute  encephalopathy 04/07/2019  . Overdose 07/06/2015  . AKI (acute kidney injury) ([place]) 07/06/2015  . Polysubstance abuse ([place]) 07/06/2015  . DM2 (diabetes mellitus, type 2) ([place]) 07/06/2015  . Dehydration   . MDD (major depressive disorder), recurrent episode, mild ([place])   . Uncomplicated alcohol dependence ([place])     Past Surgical History:  Procedure Laterality Date  . BUBBLE STUDY  03/30/2020   Procedure: BUBBLE STUDY;  Surgeon: Jerline Pain, MD;  Location: [place];  Service: Cardiovascular;;  . TEE WITHOUT CARDIOVERSION N/A 03/30/2020   Procedure: TRANSESOPHAGEAL ECHOCARDIOGRAM (TEE);  Surgeon: Jerline Pain, MD;  Location: [facility] ENDOSCOPY;  Service: Cardiovascular;  Laterality: N/A;  . TESTICLE TORSION REDUCTION         No family history on file.  Social History   Tobacco Use  . Smoking status: Current Every Day Smoker    Packs/day: 0.50    Types: Cigarettes  . Smokeless tobacco: Never Used  Substance Use Topics  . Alcohol use: Yes  . Drug use: Yes    Types: Cocaine    Home Medications Prior to Admission medications   Medication Sig Start Date End Date Taking? Authorizing Provider  acetaminophen (TYLENOL) 325 MG tablet Take 2 tablets (650 mg total) by mouth every 6 (six) hours as needed for mild pain (or Fever >/= 101). 04/05/20   Oswald Hillock, MD  amitriptyline (ELAVIL) 10 MG tablet Take 20 mg by mouth at bedtime.    [provider]  aspirin EC 81 MG EC tablet Take 1 tablet (  81 mg total) by mouth daily. Swallow whole. 03/31/20 04/30/20  Arrien, Jimmy Picket, MD  atorvastatin (LIPITOR) 20 MG tablet Take 20 mg by mouth daily.    [provider]  blood glucose meter kit and supplies KIT Dispense based on patient and insurance preference. Use up to four times daily as directed. (FOR ICD-9 250.00, 250.01). 09/13/19   Isla Pence, MD  cyclobenzaprine (FLEXERIL) 5 MG tablet Take 1 tablet (5 mg total) by mouth 3 (three) times daily as needed for muscle  spasms. 03/30/20   Arrien, Jimmy Picket, MD  dicyclomine (BENTYL) 20 MG tablet Take 1 tablet (20 mg total) by mouth 3 (three) times daily before meals. 04/07/20   Tedd Sias, PA  docusate sodium (COLACE) 100 MG capsule Take 1 capsule (100 mg total) by mouth 2 (two) times daily. 04/05/20   Oswald Hillock, MD  empagliflozin (JARDIANCE) 25 MG TABS tablet Take 25 mg by mouth daily.    [provider]  famotidine (PEPCID) 20 MG tablet Take 1 tablet (20 mg total) by mouth daily. 04/05/20 08/03/20  Oswald Hillock, MD  feeding supplement (ENSURE ENLIVE / ENSURE PLUS) LIQD Take 237 mLs by mouth 3 (three) times daily between meals. 03/30/20 04/29/20  Arrien, Jimmy Picket, MD  gabapentin (NEURONTIN) 600 MG tablet Take 1,200 mg by mouth at bedtime.    [provider]  glipiZIDE (GLUCOTROL) 10 MG tablet Take 1 tablet (10 mg total) by mouth daily before breakfast. Patient taking differently: Take 10 mg by mouth daily. 08/24/19   [name], PA-C  insulin aspart (NOVOLOG FLEXPEN) 100 UNIT/ML FlexPen Inject 5 Units into the skin 3 (three) times daily with meals. 04/05/20   Oswald Hillock, MD  insulin glargine (LANTUS) 100 UNIT/ML Solostar Pen Inject 40 Units into the skin daily. 08/24/19   [name], PA-C  Insulin Pen Needle 29G X 12MM MISC Per instructions 04/08/19   [name], MD  melatonin 3 MG TABS tablet Take 12 mg by mouth at bedtime.    [provider]  metoprolol tartrate (LOPRESSOR) 50 MG tablet Take 1 tablet (50 mg total) by mouth 2 (two) times daily. 04/05/20   Oswald Hillock, MD  polyethylene glycol (MIRALAX / GLYCOLAX) 17 g packet Take 17 g by mouth daily as needed for moderate constipation. 03/30/20   Arrien, Jimmy Picket, MD  sucralfate (CARAFATE) 1 g tablet Take 1 tablet (1 g total) by mouth 2 (two) times daily. 04/05/20 05/05/20  Oswald Hillock, MD  tadalafil (CIALIS) 10 MG tablet Take 20 mg by mouth daily as needed for erectile dysfunction.     [provider]    Allergies    Trazodone, Lisinopril, and Omeprazole  Review of Systems   Review of Systems  Constitutional: Negative for chills and fever.  HENT: Negative for congestion.   Eyes: Negative for pain.  Respiratory: Negative for cough and shortness of breath.   Cardiovascular: Negative for chest pain and leg swelling.  Gastrointestinal: Positive for abdominal pain, nausea and vomiting. Negative for diarrhea.  Genitourinary: Negative for dysuria.  Musculoskeletal: Negative for myalgias.  Skin: Negative for rash.  Neurological: Negative for dizziness and headaches.    Physical Exam Updated Vital Signs BP (!) 148/81   Pulse 99   Temp 98.5 F (36.9 C) (Oral)   Resp 18   SpO2 100%   Physical Exam Vitals and nursing note reviewed.  Constitutional:      General: He is not in  acute distress.    Comments: Patient is chronically ill-appearing 65 year old male in no acute distress.  Nondiaphoretic, nontoxic appearing.  HENT:     Head: Normocephalic and atraumatic.     Nose: Nose normal.  Eyes:     General: No scleral icterus. Cardiovascular:     Rate and Rhythm: Normal rate and regular rhythm.     Pulses: Normal pulses.     Heart sounds: Normal heart sounds.  Pulmonary:     Effort: Pulmonary effort is normal. No respiratory distress.     Breath sounds: No wheezing.  Abdominal:     Palpations: Abdomen is soft.     Tenderness: There is abdominal tenderness in the epigastric area.  Musculoskeletal:     Cervical back: Normal range of motion.     Right lower leg: No edema.     Left lower leg: No edema.  Skin:    General: Skin is warm and dry.     Capillary Refill: Capillary refill takes less than 2 seconds.  Neurological:     Mental Status: He is alert. Mental status is at baseline.  Psychiatric:        Mood and Affect: Mood normal.        Behavior: Behavior normal.     ED Results / Procedures / Treatments   Labs (all labs ordered are listed,  but only abnormal results are displayed) Labs Reviewed  COMPREHENSIVE METABOLIC PANEL - Abnormal; Notable for the following components:      Result Value   Sodium 128 (*)    Chloride 89 (*)    Glucose, Bld 382 (*)    BUN 26 (*)    All other components within normal limits  CBC - Abnormal; Notable for the following components:   Platelets 427 (*)    All other components within normal limits  URINALYSIS, ROUTINE W REFLEX MICROSCOPIC - Abnormal; Notable for the following components:   Glucose, UA >=500 (*)    Ketones, ur 20 (*)    Protein, ur 100 (*)    All other components within normal limits  RAPID URINE DRUG SCREEN, HOSP PERFORMED - Abnormal; Notable for the following components:   Opiates POSITIVE (*)    All other components within normal limits  LIPASE, BLOOD  TSH    EKG EKG Interpretation  Date/Time:  Tuesday April 07 2020 12:26:22 EDT Ventricular Rate:  111 PR Interval:    QRS Duration: 86 QT Interval:  329 QTC Calculation: 447 R Axis:   67 Text Interpretation: Sinus tachycardia Biatrial enlargement Borderline ST elevation, anterior leads Confirmed by Dene Gentry [id number]) on 04/07/2020 1:34:57 PM   Radiology No results found.  Procedures Procedures   Medications Ordered in ED Medications  haloperidol lactate (HALDOL) injection 2 mg (2 mg Intravenous Given 04/07/20 1234)  morphine 4 MG/ML injection 4 mg (4 mg Intravenous Given 04/07/20 1235)  sodium chloride 0.9 % bolus 1,000 mL (0 mLs Intravenous Stopped 04/07/20 1420)  famotidine (PEPCID) IVPB 20 mg premix (0 mg Intravenous Stopped 04/07/20 1350)    ED Course  I have reviewed the triage vital signs and the nursing notes.  Pertinent labs & imaging results that were available during my care of the patient were reviewed by me and considered in my medical decision making (see chart for details).    MDM Rules/Calculators/A&P                          Patient is a 65 year old  male with a past medical history  significant for polysubstance abuse and chronic abdominal pain nausea and vomiting.  Presented to the ER with nausea and vomiting.  He had a CT imaging done yesterday at [facility] which I reviewed and does not appear to be notable for any significant acute disease.  His vital signs are abnormal here in the ER today.  He appears clinically dehydrated and his heart rate is 130.  Will provide with fluids,  obtain basic lab work, provide with Haldol, Pepcid and morphine and reevaluate.  Initial physical exam patient is epigastric tenderness.  This resolved after medications.  He was able to tolerate p.o. without difficulty.  Feels much improved after Haldol.  CBC without leukocytosis or anemia.  Sodium 128 which is due to hyperglycemia.  His sodium corrects to normal.  UA is notable for protein and glucose.  Also a few ketones.  Rehydration was completed.  Lipase within normal limits I doubt pancreatitis.  TSH within normal limits.  Urine drug screen negative apart from opioids.  Patient reevaluated is no longer tender.  Feeling well.  Tolerating p.o.  Heart rate improved from 130->> 90 on my reevaluation.  We will refill patient's Bentyl and recommend follow-up with gastroenterology.  I provided him with the information for 2 different offices.  He is understanding of plan and agreeable.  Final Clinical Impression(s) / ED Diagnoses Final diagnoses:  Generalized abdominal pain  Non-intractable vomiting with nausea, unspecified vomiting type    Rx / DC Orders ED Discharge Orders         Ordered    dicyclomine (BENTYL) 20 MG tablet  3 times daily before meals        04/07/20 1418           Pati Gallo [place], [place] 04/08/20 1657    Valarie Merino, MD 04/09/20 1459

## 2020-04-07 NOTE — Discharge Instructions (Signed)
Please take your medications as prescribed.  Please drink plenty of water.  Please use Bentyl that you have been prescribed before.  I have represcribe this to you.  Please follow-up with the gastroenterologist I have recommended to you.  You may follow-up with either of the 2 offices that I have referred you to.

## 2020-04-08 ENCOUNTER — Telehealth: Payer: Self-pay | Admitting: *Deleted

## 2020-04-08 ENCOUNTER — Other Ambulatory Visit: Payer: Self-pay | Admitting: Gastroenterology

## 2020-04-08 DIAGNOSIS — R109 Unspecified abdominal pain: Secondary | ICD-10-CM

## 2020-04-08 NOTE — Telephone Encounter (Signed)
Spoke with patient inform the patient that lower extremity venous Dopplers were normal without evidence of any blood clots noted. Patient verbalized understanding, appreciation.

## 2020-04-10 ENCOUNTER — Other Ambulatory Visit (HOSPITAL_COMMUNITY): Payer: Self-pay | Admitting: Gastroenterology

## 2020-04-10 DIAGNOSIS — R110 Nausea: Secondary | ICD-10-CM

## 2020-04-11 ENCOUNTER — Other Ambulatory Visit: Payer: Self-pay

## 2020-04-11 ENCOUNTER — Encounter (HOSPITAL_COMMUNITY): Payer: Self-pay | Admitting: Emergency Medicine

## 2020-04-11 ENCOUNTER — Emergency Department (HOSPITAL_COMMUNITY)
Admission: EM | Admit: 2020-04-11 | Discharge: 2020-04-11 | Disposition: A | Discharge status: Home / Self Care | Payer: No Typology Code available for payment source | Source: Home / Self Care | Attending: Emergency Medicine | Admitting: Emergency Medicine

## 2020-04-11 DIAGNOSIS — R109 Unspecified abdominal pain: Secondary | ICD-10-CM | POA: Insufficient documentation

## 2020-04-11 DIAGNOSIS — R197 Diarrhea, unspecified: Secondary | ICD-10-CM | POA: Insufficient documentation

## 2020-04-11 DIAGNOSIS — E1100 Type 2 diabetes mellitus with hyperosmolarity without nonketotic hyperglycemic-hyperosmolar coma (NKHHC): Secondary | ICD-10-CM | POA: Insufficient documentation

## 2020-04-11 DIAGNOSIS — R000 Tachycardia, unspecified: Secondary | ICD-10-CM | POA: Insufficient documentation

## 2020-04-11 DIAGNOSIS — R1110 Vomiting, unspecified: Secondary | ICD-10-CM | POA: Insufficient documentation

## 2020-04-11 DIAGNOSIS — Z7982 Long term (current) use of aspirin: Secondary | ICD-10-CM | POA: Insufficient documentation

## 2020-04-11 DIAGNOSIS — N179 Acute kidney failure, unspecified: Secondary | ICD-10-CM | POA: Diagnosis not present

## 2020-04-11 DIAGNOSIS — B029 Zoster without complications: Secondary | ICD-10-CM

## 2020-04-11 DIAGNOSIS — Z7984 Long term (current) use of oral hypoglycemic drugs: Secondary | ICD-10-CM | POA: Insufficient documentation

## 2020-04-11 DIAGNOSIS — Z794 Long term (current) use of insulin: Secondary | ICD-10-CM | POA: Insufficient documentation

## 2020-04-11 DIAGNOSIS — F17210 Nicotine dependence, cigarettes, uncomplicated: Secondary | ICD-10-CM | POA: Insufficient documentation

## 2020-04-11 DIAGNOSIS — G8929 Other chronic pain: Secondary | ICD-10-CM | POA: Insufficient documentation

## 2020-04-11 DIAGNOSIS — R079 Chest pain, unspecified: Secondary | ICD-10-CM | POA: Insufficient documentation

## 2020-04-11 DIAGNOSIS — R0602 Shortness of breath: Secondary | ICD-10-CM | POA: Insufficient documentation

## 2020-04-11 LAB — CBC WITH DIFFERENTIAL/PLATELET
Abs Immature Granulocytes: 0.02 10*3/uL (ref 0.00–0.07)
Basophils Absolute: 0 10*3/uL (ref 0.0–0.1)
Basophils Relative: 0 %
Eosinophils Absolute: 0 10*3/uL (ref 0.0–0.5)
Eosinophils Relative: 0 %
HCT: 45.8 % (ref 39.0–52.0)
Hemoglobin: 15.4 g/dL (ref 13.0–17.0)
Immature Granulocytes: 0 %
Lymphocytes Relative: 30 %
Lymphs Abs: 2 10*3/uL (ref 0.7–4.0)
MCH: 28.8 pg (ref 26.0–34.0)
MCHC: 33.6 g/dL (ref 30.0–36.0)
MCV: 85.8 fL (ref 80.0–100.0)
Monocytes Absolute: 0.5 10*3/uL (ref 0.1–1.0)
Monocytes Relative: 7 %
Neutro Abs: 4 10*3/uL (ref 1.7–7.7)
Neutrophils Relative %: 63 %
Platelets: 307 10*3/uL (ref 150–400)
RBC: 5.34 MIL/uL (ref 4.22–5.81)
RDW: 12.2 % (ref 11.5–15.5)
WBC: 6.5 10*3/uL (ref 4.0–10.5)
nRBC: 0 % (ref 0.0–0.2)

## 2020-04-11 LAB — COMPREHENSIVE METABOLIC PANEL
ALT: 24 U/L (ref 0–44)
AST: 27 U/L (ref 15–41)
Albumin: 3.8 g/dL (ref 3.5–5.0)
Alkaline Phosphatase: 57 U/L (ref 38–126)
Anion gap: 13 (ref 5–15)
BUN: 24 mg/dL — ABNORMAL HIGH (ref 8–23)
CO2: 27 mmol/L (ref 22–32)
Calcium: 9.6 mg/dL (ref 8.9–10.3)
Chloride: 90 mmol/L — ABNORMAL LOW (ref 98–111)
Creatinine, Ser: 1.2 mg/dL (ref 0.61–1.24)
GFR, Estimated: >60 mL/min (ref >60)
Glucose, Bld: 164 mg/dL — ABNORMAL HIGH (ref 70–99)
Potassium: 3.4 mmol/L — ABNORMAL LOW (ref 3.5–5.1)
Sodium: 130 mmol/L — ABNORMAL LOW (ref 135–145)
Total Bilirubin: 0.9 mg/dL (ref 0.3–1.2)
Total Protein: 7.5 g/dL (ref 6.5–8.1)

## 2020-04-11 LAB — LIPASE, BLOOD: Lipase: 19 U/L (ref 11–51)

## 2020-04-11 MED ORDER — ONDANSETRON 4 MG PO TBDP: 4.0000 mg | ORAL_TABLET | Freq: Three times a day (TID) | ORAL | 0 refills | Status: DC | PRN

## 2020-04-11 MED ORDER — SODIUM CHLORIDE 0.9 % IV BOLUS
1000.0000 mL | Freq: Once | INTRAVENOUS | Status: AC
  Administered 2020-04-11: 1000 mL via INTRAVENOUS

## 2020-04-11 MED ORDER — HYDROMORPHONE HCL 2 MG PO TABS: 2.0000 mg | ORAL_TABLET | ORAL | 0 refills | Status: DC | PRN

## 2020-04-11 MED ORDER — VALACYCLOVIR HCL 500 MG PO TABS
1000.0000 mg | ORAL_TABLET | Freq: Once | ORAL | Status: AC
  Administered 2020-04-11: 1000 mg via ORAL
  Filled 2020-04-11: qty 2

## 2020-04-11 MED ORDER — ONDANSETRON HCL 4 MG/2ML IJ SOLN
4.0000 mg | Freq: Once | INTRAMUSCULAR | Status: AC
  Administered 2020-04-11: 4 mg via INTRAVENOUS
  Filled 2020-04-11: qty 2

## 2020-04-11 MED ORDER — POTASSIUM CHLORIDE CRYS ER 20 MEQ PO TBCR
40.0000 meq | EXTENDED_RELEASE_TABLET | Freq: Once | ORAL | Status: AC
  Administered 2020-04-11: 40 meq via ORAL
  Filled 2020-04-11: qty 2

## 2020-04-11 MED ORDER — HYDROMORPHONE HCL 1 MG/ML IJ SOLN
1.0000 mg | Freq: Once | INTRAMUSCULAR | Status: AC
Start: 2020-04-11 — End: 2020-04-11
  Administered 2020-04-11: 1 mg via INTRAVENOUS
  Filled 2020-04-11: qty 1

## 2020-04-11 MED ORDER — VALACYCLOVIR HCL 1 G PO TABS: 1000.0000 mg | ORAL_TABLET | Freq: Three times a day (TID) | ORAL | 0 refills | Status: DC

## 2020-04-11 NOTE — ED Triage Notes (Signed)
Patient complains of a rash and R nipple pain that he noticed today, rash has some closed vesicular areas, wraps around his R side, states it is very painful.

## 2020-04-11 NOTE — ED Provider Notes (Signed)
[facility] DEPT Provider Note   CSN: [id number] Arrival date & time: 04/11/20  1712     History Chief Complaint  Patient presents with  . Rash    William Acevedo is a 65 y.o. male.  HPI 65 year old male presents with rash and chest pain.  Had a sharp pain starting in his mid axilla and right chest yesterday and then noticed a rash about an hour prior to arrival.  Concerned it might be shingles.  The pain is a sharp pain and severe.  He is also been having chronic abdominal pain that is still bad and causing numerous episodes of vomiting and diarrhea including today.  No fevers.  His shortness of breath has been chronic and constant since the abdominal pain started and is not different today.  Tried some hydrocodone with minimal relief.   Past Medical History:  Diagnosis Date  . Carpal tunnel syndrome   . Depression   . Diabetes mellitus without complication ([place])   . Neuropathy     Patient Active Problem List   Diagnosis Date Noted  . Abdominal pain 04/02/2020  . Protein-calorie malnutrition, severe 03/27/2020  . Cerebral thrombosis with cerebral infarction 03/26/2020  . NSTEMI (non-ST elevated myocardial infarction) ([place]) 03/25/2020  . Secondary diabetes mellitus with HHNC (hyperglycemia hyperosmolar non-ketotic coma) ([place]) 03/25/2020  . Elevated troponin   . Renal infarct ([place])   . Cerebrovascular accident (CVA) due to embolism of cerebral artery ([place])   . Cocaine use disorder, severe, dependence ([place]) 04/08/2019  . Major depressive disorder 04/08/2019  . Epiglottitis 04/07/2019  . Cocaine use 04/07/2019  . Acute encephalopathy 04/07/2019  . Overdose 07/06/2015  . AKI (acute kidney injury) ([place]) 07/06/2015  . Polysubstance abuse ([place]) 07/06/2015  . DM2 (diabetes mellitus, type 2) ([place]) 07/06/2015  . Dehydration   . MDD (major depressive disorder), recurrent episode, mild ([place])   . Uncomplicated alcohol dependence ([place])     Past  Surgical History:  Procedure Laterality Date  . BUBBLE STUDY  03/30/2020   Procedure: BUBBLE STUDY;  Surgeon: Jerline Pain, MD;  Location: [facility];  Service: Cardiovascular;;  . TEE WITHOUT CARDIOVERSION N/A 03/30/2020   Procedure: TRANSESOPHAGEAL ECHOCARDIOGRAM (TEE);  Surgeon: Jerline Pain, MD;  Location: [facility]COPY;  Service: Cardiovascular;  Laterality: N/A;  . TESTICLE TORSION REDUCTION         No family history on file.  Social History   Tobacco Use  . Smoking status: Current Every Day Smoker    Packs/day: 0.50    Types: Cigarettes  . Smokeless tobacco: Never Used  Substance Use Topics  . Alcohol use: Yes  . Drug use: Yes    Types: Cocaine    Home Medications Prior to Admission medications   Medication Sig Start Date End Date Taking? Authorizing Provider  HYDROmorphone (DILAUDID) 2 MG tablet Take 1 tablet (2 mg total) by mouth every 4 (four) hours as needed for severe pain. 04/11/20  Yes Sherwood Gambler, MD  ondansetron (ZOFRAN ODT) 4 MG disintegrating tablet Take 1 tablet (4 mg total) by mouth every 8 (eight) hours as needed for nausea or vomiting. 04/11/20  Yes Sherwood Gambler, MD  valACYclovir (VALTREX) 1000 MG tablet Take 1 tablet (1,000 mg total) by mouth 3 (three) times daily. 04/11/20  Yes Sherwood Gambler, MD  acetaminophen (TYLENOL) 325 MG tablet Take 2 tablets (650 mg total) by mouth every 6 (six) hours as needed for mild pain (or Fever >/= 101). 04/05/20   Iraq, Gagan  S, MD  amitriptyline (ELAVIL) 10 MG tablet Take 20 mg by mouth at bedtime.    [provider]  aspirin EC 81 MG EC tablet Take 1 tablet (81 mg total) by mouth daily. Swallow whole. 03/31/20 04/30/20  Arrien, Jimmy Picket, MD  atorvastatin (LIPITOR) 20 MG tablet Take 20 mg by mouth daily.    [provider]  blood glucose meter kit and supplies KIT Dispense based on patient and insurance preference. Use up to four times daily as directed. (FOR ICD-9 250.00, 250.01). 09/13/19    Isla Pence, MD  cyclobenzaprine (FLEXERIL) 5 MG tablet Take 1 tablet (5 mg total) by mouth 3 (three) times daily as needed for muscle spasms. 03/30/20   Arrien, Jimmy Picket, MD  dicyclomine (BENTYL) 20 MG tablet Take 1 tablet (20 mg total) by mouth 3 (three) times daily before meals. 04/07/20   Tedd Sias, PA  docusate sodium (COLACE) 100 MG capsule Take 1 capsule (100 mg total) by mouth 2 (two) times daily. 04/05/20   Oswald Hillock, MD  empagliflozin (JARDIANCE) 25 MG TABS tablet Take 25 mg by mouth daily.    [provider]  famotidine (PEPCID) 20 MG tablet Take 1 tablet (20 mg total) by mouth daily. 04/05/20 08/03/20  Oswald Hillock, MD  feeding supplement (ENSURE ENLIVE / ENSURE PLUS) LIQD Take 237 mLs by mouth 3 (three) times daily between meals. 03/30/20 04/29/20  Arrien, Jimmy Picket, MD  gabapentin (NEURONTIN) 600 MG tablet Take 1,200 mg by mouth at bedtime.    [provider]  glipiZIDE (GLUCOTROL) 10 MG tablet Take 1 tablet (10 mg total) by mouth daily before breakfast. Patient taking differently: Take 10 mg by mouth daily. 08/24/19   [name], PA-C  insulin aspart (NOVOLOG FLEXPEN) 100 UNIT/ML FlexPen Inject 5 Units into the skin 3 (three) times daily with meals. 04/05/20   Oswald Hillock, MD  insulin glargine (LANTUS) 100 UNIT/ML Solostar Pen Inject 40 Units into the skin daily. 08/24/19   [name], PA-C  Insulin Pen Needle 29G X 12MM MISC Per instructions 04/08/19   [name], MD  melatonin 3 MG TABS tablet Take 12 mg by mouth at bedtime.    [provider]  metoprolol tartrate (LOPRESSOR) 50 MG tablet Take 1 tablet (50 mg total) by mouth 2 (two) times daily. 04/05/20   Oswald Hillock, MD  polyethylene glycol (MIRALAX / GLYCOLAX) 17 g packet Take 17 g by mouth daily as needed for moderate constipation. 03/30/20   Arrien, Jimmy Picket, MD  sucralfate (CARAFATE) 1 g tablet Take 1 tablet (1 g total) by mouth 2 (two) times daily.  04/05/20 05/05/20  Oswald Hillock, MD  tadalafil (CIALIS) 10 MG tablet Take 20 mg by mouth daily as needed for erectile dysfunction.    [provider]    Allergies    Trazodone, Lisinopril, and Omeprazole  Review of Systems   Review of Systems  Constitutional: Negative for fever.  Respiratory: Positive for shortness of breath.   Cardiovascular: Positive for chest pain.  Gastrointestinal: Positive for abdominal pain, diarrhea, nausea and vomiting.  Skin: Positive for rash.  All other systems reviewed and are negative.   Physical Exam Updated Vital Signs BP (!) 145/96   Pulse 92   Temp 99.4 F (37.4 C) (Oral)   Resp 15   Ht _0  (1.676 m)   Wt 56 kg   SpO2 98%   BMI 19.93 kg/m   Physical  Exam Vitals and nursing note reviewed.  Constitutional:      Appearance: He is well-developed.  HENT:     Head: Normocephalic and atraumatic.     Right Ear: External ear normal.     Left Ear: External ear normal.     Nose: Nose normal.  Eyes:     General:        Right eye: No discharge.        Left eye: No discharge.  Cardiovascular:     Rate and Rhythm: Regular rhythm. Tachycardia present.     Heart sounds: Normal heart sounds.  Pulmonary:     Effort: Pulmonary effort is normal.     Breath sounds: Normal breath sounds.  Chest:    Abdominal:     General: There is no distension.     Palpations: Abdomen is soft.     Tenderness: There is generalized abdominal tenderness.  Musculoskeletal:     Cervical back: Neck supple.  Skin:    General: Skin is warm and dry.  Neurological:     Mental Status: He is alert.  Psychiatric:        Mood and Affect: Mood is not anxious.     ED Results / Procedures / Treatments   Labs (all labs ordered are listed, but only abnormal results are displayed) Labs Reviewed  COMPREHENSIVE METABOLIC PANEL - Abnormal; Notable for the following components:      Result Value   Sodium 130 (*)    Potassium 3.4 (*)    Chloride 90 (*)     Glucose, Bld 164 (*)    BUN 24 (*)    All other components within normal limits  LIPASE, BLOOD  CBC WITH DIFFERENTIAL/PLATELET  URINALYSIS, ROUTINE W REFLEX MICROSCOPIC    EKG EKG Interpretation  Date/Time:  Saturday April 11 2020 18:02:50 EDT Ventricular Rate:  109 PR Interval:    QRS Duration: 78 QT Interval:  347 QTC Calculation: 468 R Axis:   62 Text Interpretation: Sinus tachycardia Biatrial enlargement similar to Apr 07 2020 Confirmed by Sherwood Gambler [phone]) on 04/11/2020 6:16:14 PM   Radiology No results found.  Procedures Procedures   Medications Ordered in ED Medications  sodium chloride 0.9 % bolus 1,000 mL (0 mLs Intravenous Stopped 04/11/20 1921)  HYDROmorphone (DILAUDID) injection 1 mg (1 mg Intravenous Given 04/11/20 1822)  ondansetron (ZOFRAN) injection 4 mg (4 mg Intravenous Given 04/11/20 1821)  valACYclovir (VALTREX) tablet 1,000 mg (1,000 mg Oral Given 04/11/20 1823)  potassium chloride SA (KLOR-CON) CR tablet 40 mEq (40 mEq Oral Given 04/11/20 1949)    ED Course  I have reviewed the triage vital signs and the nursing notes.  Pertinent labs & imaging results that were available during my care of the patient were reviewed by me and considered in my medical decision making (see chart for details).    MDM Rules/Calculators/A&P                          Patient's abdominal pain appears to be chronic in etiology.  He reports no new dysuria though he has chronic urine problems for many years.  However with benign laboratory work-up besides minimal hypokalemia, I do not think repeat imaging such as CT would be beneficial.  As for his rash, this is consistent with zoster and given the early presentation I will treat with valacyclovir.  Will give short course of pain medicine given the shingles itself.  Follow-up with PCP. Final  Clinical Impression(s) / ED Diagnoses Final diagnoses:  Herpes zoster without complication  Chronic abdominal pain    Rx / DC  Orders ED Discharge Orders         Ordered    HYDROmorphone (DILAUDID) 2 MG tablet  Every 4 hours PRN        04/11/20 1938    ondansetron (ZOFRAN ODT) 4 MG disintegrating tablet  Every 8 hours PRN        04/11/20 1938    valACYclovir (VALTREX) 1000 MG tablet  3 times daily        04/11/20 1938           Sherwood Gambler, MD 04/11/20 2123

## 2020-04-11 NOTE — Discharge Instructions (Addendum)
If you develop worsening, continued, or recurrent abdominal pain, uncontrolled vomiting, fever, chest or back pain, or any other new/concerning symptoms then return to the ER for evaluation.  

## 2020-04-14 ENCOUNTER — Emergency Department (HOSPITAL_COMMUNITY): Payer: No Typology Code available for payment source

## 2020-04-14 ENCOUNTER — Inpatient Hospital Stay (HOSPITAL_COMMUNITY)
Admission: EM | Admit: 2020-04-14 | Discharge: 2020-04-19 | DRG: 682 | Disposition: A | Discharge status: Home Health | Payer: No Typology Code available for payment source | Attending: Internal Medicine | Admitting: Internal Medicine

## 2020-04-14 ENCOUNTER — Encounter (HOSPITAL_COMMUNITY): Payer: Self-pay

## 2020-04-14 DIAGNOSIS — I252 Old myocardial infarction: Secondary | ICD-10-CM | POA: Diagnosis not present

## 2020-04-14 DIAGNOSIS — I160 Hypertensive urgency: Secondary | ICD-10-CM | POA: Diagnosis not present

## 2020-04-14 DIAGNOSIS — Z7982 Long term (current) use of aspirin: Secondary | ICD-10-CM | POA: Diagnosis not present

## 2020-04-14 DIAGNOSIS — Z885 Allergy status to narcotic agent status: Secondary | ICD-10-CM | POA: Diagnosis not present

## 2020-04-14 DIAGNOSIS — Y92239 Unspecified place in hospital as the place of occurrence of the external cause: Secondary | ICD-10-CM | POA: Diagnosis not present

## 2020-04-14 DIAGNOSIS — I10 Essential (primary) hypertension: Secondary | ICD-10-CM | POA: Diagnosis present

## 2020-04-14 DIAGNOSIS — E1301 Other specified diabetes mellitus with hyperosmolarity with coma: Secondary | ICD-10-CM

## 2020-04-14 DIAGNOSIS — E43 Unspecified severe protein-calorie malnutrition: Secondary | ICD-10-CM | POA: Diagnosis present

## 2020-04-14 DIAGNOSIS — Z79890 Hormone replacement therapy (postmenopausal): Secondary | ICD-10-CM | POA: Diagnosis not present

## 2020-04-14 DIAGNOSIS — Z794 Long term (current) use of insulin: Secondary | ICD-10-CM | POA: Diagnosis not present

## 2020-04-14 DIAGNOSIS — E1165 Type 2 diabetes mellitus with hyperglycemia: Secondary | ICD-10-CM | POA: Diagnosis present

## 2020-04-14 DIAGNOSIS — E860 Dehydration: Secondary | ICD-10-CM | POA: Diagnosis present

## 2020-04-14 DIAGNOSIS — F1413 Cocaine abuse, unspecified with withdrawal: Secondary | ICD-10-CM | POA: Diagnosis present

## 2020-04-14 DIAGNOSIS — Z7984 Long term (current) use of oral hypoglycemic drugs: Secondary | ICD-10-CM

## 2020-04-14 DIAGNOSIS — K922 Gastrointestinal hemorrhage, unspecified: Secondary | ICD-10-CM | POA: Diagnosis not present

## 2020-04-14 DIAGNOSIS — E785 Hyperlipidemia, unspecified: Secondary | ICD-10-CM | POA: Diagnosis present

## 2020-04-14 DIAGNOSIS — F17210 Nicotine dependence, cigarettes, uncomplicated: Secondary | ICD-10-CM | POA: Diagnosis present

## 2020-04-14 DIAGNOSIS — Z79899 Other long term (current) drug therapy: Secondary | ICD-10-CM

## 2020-04-14 DIAGNOSIS — Z8673 Personal history of transient ischemic attack (TIA), and cerebral infarction without residual deficits: Secondary | ICD-10-CM

## 2020-04-14 DIAGNOSIS — Q211 Atrial septal defect: Secondary | ICD-10-CM

## 2020-04-14 DIAGNOSIS — Z20822 Contact with and (suspected) exposure to covid-19: Secondary | ICD-10-CM | POA: Diagnosis present

## 2020-04-14 DIAGNOSIS — G9341 Metabolic encephalopathy: Secondary | ICD-10-CM | POA: Diagnosis present

## 2020-04-14 DIAGNOSIS — K226 Gastro-esophageal laceration-hemorrhage syndrome: Secondary | ICD-10-CM | POA: Diagnosis present

## 2020-04-14 DIAGNOSIS — R112 Nausea with vomiting, unspecified: Secondary | ICD-10-CM | POA: Diagnosis present

## 2020-04-14 DIAGNOSIS — Z7151 Drug abuse counseling and surveillance of drug abuser: Secondary | ICD-10-CM

## 2020-04-14 DIAGNOSIS — B029 Zoster without complications: Secondary | ICD-10-CM | POA: Diagnosis present

## 2020-04-14 DIAGNOSIS — R339 Retention of urine, unspecified: Secondary | ICD-10-CM | POA: Diagnosis present

## 2020-04-14 DIAGNOSIS — N179 Acute kidney failure, unspecified: Principal | ICD-10-CM | POA: Diagnosis present

## 2020-04-14 DIAGNOSIS — D62 Acute posthemorrhagic anemia: Secondary | ICD-10-CM | POA: Diagnosis present

## 2020-04-14 DIAGNOSIS — E871 Hypo-osmolality and hyponatremia: Secondary | ICD-10-CM | POA: Diagnosis present

## 2020-04-14 DIAGNOSIS — R109 Unspecified abdominal pain: Secondary | ICD-10-CM | POA: Diagnosis present

## 2020-04-14 DIAGNOSIS — G8929 Other chronic pain: Secondary | ICD-10-CM | POA: Diagnosis present

## 2020-04-14 DIAGNOSIS — F1910 Other psychoactive substance abuse, uncomplicated: Secondary | ICD-10-CM | POA: Diagnosis not present

## 2020-04-14 DIAGNOSIS — Z888 Allergy status to other drugs, medicaments and biological substances status: Secondary | ICD-10-CM | POA: Diagnosis not present

## 2020-04-14 DIAGNOSIS — L84 Corns and callosities: Secondary | ICD-10-CM | POA: Diagnosis present

## 2020-04-14 DIAGNOSIS — F1210 Cannabis abuse, uncomplicated: Secondary | ICD-10-CM | POA: Diagnosis present

## 2020-04-14 DIAGNOSIS — T375X5A Adverse effect of antiviral drugs, initial encounter: Secondary | ICD-10-CM | POA: Diagnosis not present

## 2020-04-14 DIAGNOSIS — Z681 Body mass index (BMI) 19 or less, adult: Secondary | ICD-10-CM | POA: Diagnosis not present

## 2020-04-14 DIAGNOSIS — E861 Hypovolemia: Secondary | ICD-10-CM | POA: Diagnosis present

## 2020-04-14 HISTORY — DX: Hypertensive urgency: I16.0

## 2020-04-14 LAB — CBC WITH DIFFERENTIAL/PLATELET
Abs Immature Granulocytes: 0.03 10*3/uL (ref 0.00–0.07)
Basophils Absolute: 0 10*3/uL (ref 0.0–0.1)
Basophils Relative: 0 %
Eosinophils Absolute: 0 10*3/uL (ref 0.0–0.5)
Eosinophils Relative: 0 %
HCT: 36.9 % — ABNORMAL LOW (ref 39.0–52.0)
Hemoglobin: 12.6 g/dL — ABNORMAL LOW (ref 13.0–17.0)
Immature Granulocytes: 0 %
Lymphocytes Relative: 7 %
Lymphs Abs: 0.5 10*3/uL — ABNORMAL LOW (ref 0.7–4.0)
MCH: 28.4 pg (ref 26.0–34.0)
MCHC: 34.1 g/dL (ref 30.0–36.0)
MCV: 83.3 fL (ref 80.0–100.0)
Monocytes Absolute: 0.7 10*3/uL (ref 0.1–1.0)
Monocytes Relative: 10 %
Neutro Abs: 5.9 10*3/uL (ref 1.7–7.7)
Neutrophils Relative %: 83 %
Platelets: 179 10*3/uL (ref 150–400)
RBC: 4.43 MIL/uL (ref 4.22–5.81)
RDW: 11.9 % (ref 11.5–15.5)
WBC: 7.2 10*3/uL (ref 4.0–10.5)
nRBC: 0 % (ref 0.0–0.2)

## 2020-04-14 LAB — GLUCOSE, CAPILLARY: Glucose-Capillary: 283 mg/dL — ABNORMAL HIGH (ref 70–99)

## 2020-04-14 LAB — CREATININE, URINE, RANDOM: Creatinine, Urine: 38.71 mg/dL

## 2020-04-14 LAB — COMPREHENSIVE METABOLIC PANEL
ALT: 18 U/L (ref 0–44)
AST: 27 U/L (ref 15–41)
Albumin: 3.2 g/dL — ABNORMAL LOW (ref 3.5–5.0)
Alkaline Phosphatase: 56 U/L (ref 38–126)
Anion gap: 11 (ref 5–15)
BUN: 47 mg/dL — ABNORMAL HIGH (ref 8–23)
CO2: 26 mmol/L (ref 22–32)
Calcium: 8.6 mg/dL — ABNORMAL LOW (ref 8.9–10.3)
Chloride: 87 mmol/L — ABNORMAL LOW (ref 98–111)
Creatinine, Ser: 5.07 mg/dL — ABNORMAL HIGH (ref 0.61–1.24)
GFR, Estimated: 12 mL/min — ABNORMAL LOW (ref >60)
Glucose, Bld: 403 mg/dL — ABNORMAL HIGH (ref 70–99)
Potassium: 4.7 mmol/L (ref 3.5–5.1)
Sodium: 124 mmol/L — ABNORMAL LOW (ref 135–145)
Total Bilirubin: 0.6 mg/dL (ref 0.3–1.2)
Total Protein: 6.4 g/dL — ABNORMAL LOW (ref 6.5–8.1)

## 2020-04-14 LAB — BASIC METABOLIC PANEL
Anion gap: 10 (ref 5–15)
BUN: 49 mg/dL — ABNORMAL HIGH (ref 8–23)
CO2: 24 mmol/L (ref 22–32)
Calcium: 8.3 mg/dL — ABNORMAL LOW (ref 8.9–10.3)
Chloride: 91 mmol/L — ABNORMAL LOW (ref 98–111)
Creatinine, Ser: 5.24 mg/dL — ABNORMAL HIGH (ref 0.61–1.24)
GFR, Estimated: 11 mL/min — ABNORMAL LOW (ref >60)
Glucose, Bld: 424 mg/dL — ABNORMAL HIGH (ref 70–99)
Potassium: 4.5 mmol/L (ref 3.5–5.1)
Sodium: 125 mmol/L — ABNORMAL LOW (ref 135–145)

## 2020-04-14 LAB — URINALYSIS, ROUTINE W REFLEX MICROSCOPIC
Bacteria, UA: NONE SEEN
Bilirubin Urine: NEGATIVE
Glucose, UA: >=500 mg/dL — AB
Ketones, ur: NEGATIVE mg/dL
Leukocytes,Ua: NEGATIVE
Nitrite: NEGATIVE
Protein, ur: 30 mg/dL — AB
Specific Gravity, Urine: 1.005 (ref 1.005–1.030)
pH: 6 (ref 5.0–8.0)

## 2020-04-14 LAB — RAPID URINE DRUG SCREEN, HOSP PERFORMED
Amphetamines: NOT DETECTED
Barbiturates: NOT DETECTED
Benzodiazepines: NOT DETECTED
Cocaine: POSITIVE — AB
Opiates: POSITIVE — AB
Tetrahydrocannabinol: NOT DETECTED

## 2020-04-14 LAB — HEMOGLOBIN AND HEMATOCRIT, BLOOD
HCT: 35.1 % — ABNORMAL LOW (ref 39.0–52.0)
Hemoglobin: 12.2 g/dL — ABNORMAL LOW (ref 13.0–17.0)

## 2020-04-14 LAB — RESP PANEL BY RT-PCR (FLU A&B, COVID) ARPGX2
Influenza A by PCR: NEGATIVE
Influenza B by PCR: NEGATIVE
SARS Coronavirus 2 by RT PCR: NEGATIVE

## 2020-04-14 LAB — SODIUM, URINE, RANDOM: Sodium, Ur: 39 mmol/L

## 2020-04-14 LAB — POC OCCULT BLOOD, ED: Fecal Occult Bld: POSITIVE — AB

## 2020-04-14 LAB — HEMOGLOBIN A1C
Hgb A1c MFr Bld: 10.7 % — ABNORMAL HIGH (ref 4.8–5.6)
Mean Plasma Glucose: 260.39 mg/dL

## 2020-04-14 LAB — CBG MONITORING, ED: Glucose-Capillary: 371 mg/dL — ABNORMAL HIGH (ref 70–99)

## 2020-04-14 LAB — OSMOLALITY: Osmolality: 302 mOsm/kg — ABNORMAL HIGH (ref 275–295)

## 2020-04-14 LAB — LIPASE, BLOOD: Lipase: 41 U/L (ref 11–51)

## 2020-04-14 LAB — CK: Total CK: 171 U/L (ref 49–397)

## 2020-04-14 MED ORDER — HALOPERIDOL LACTATE 5 MG/ML IJ SOLN
5.0000 mg | Freq: Four times a day (QID) | INTRAMUSCULAR | Status: AC | PRN
  Administered 2020-04-15 – 2020-04-18 (×2): 5 mg via INTRAVENOUS
  Filled 2020-04-14 (×2): qty 1

## 2020-04-14 MED ORDER — INSULIN GLARGINE 100 UNIT/ML ~~LOC~~ SOLN
10.0000 [IU] | Freq: Every day | SUBCUTANEOUS | Status: DC
  Administered 2020-04-14 – 2020-04-15 (×2): 10 [IU] via SUBCUTANEOUS
  Filled 2020-04-14 (×3): qty 0.1

## 2020-04-14 MED ORDER — HYDRALAZINE HCL 25 MG PO TABS
25.0000 mg | ORAL_TABLET | Freq: Three times a day (TID) | ORAL | Status: DC
  Administered 2020-04-14 – 2020-04-17 (×9): 25 mg via ORAL
  Filled 2020-04-14 (×9): qty 1

## 2020-04-14 MED ORDER — VALACYCLOVIR HCL 500 MG PO TABS: 1000.0000 mg | ORAL_TABLET | Freq: Three times a day (TID) | ORAL | Status: DC

## 2020-04-14 MED ORDER — SODIUM CHLORIDE 0.9% FLUSH
3.0000 mL | Freq: Two times a day (BID) | INTRAVENOUS | Status: DC
  Administered 2020-04-14 – 2020-04-19 (×9): 3 mL via INTRAVENOUS

## 2020-04-14 MED ORDER — ACETAMINOPHEN 325 MG PO TABS
650.0000 mg | ORAL_TABLET | Freq: Four times a day (QID) | ORAL | Status: DC | PRN
  Administered 2020-04-16: 650 mg via ORAL
  Filled 2020-04-14: qty 2

## 2020-04-14 MED ORDER — HYDRALAZINE HCL 20 MG/ML IJ SOLN
10.0000 mg | INTRAMUSCULAR | Status: DC | PRN
  Administered 2020-04-15: 10 mg via INTRAVENOUS

## 2020-04-14 MED ORDER — FAMOTIDINE IN NACL 20-0.9 MG/50ML-% IV SOLN
20.0000 mg | Freq: Once | INTRAVENOUS | Status: AC
  Administered 2020-04-14: 20 mg via INTRAVENOUS
  Filled 2020-04-14: qty 50

## 2020-04-14 MED ORDER — MORPHINE SULFATE (PF) 4 MG/ML IV SOLN
4.0000 mg | Freq: Once | INTRAVENOUS | Status: AC
  Administered 2020-04-14: 4 mg via INTRAVENOUS
  Filled 2020-04-14: qty 1

## 2020-04-14 MED ORDER — METOPROLOL TARTRATE 25 MG PO TABS: 50.0000 mg | ORAL_TABLET | Freq: Two times a day (BID) | ORAL | Status: DC

## 2020-04-14 MED ORDER — INSULIN ASPART 100 UNIT/ML ~~LOC~~ SOLN
0.0000 [IU] | SUBCUTANEOUS | Status: DC
  Administered 2020-04-14: 3 [IU] via SUBCUTANEOUS
  Administered 2020-04-14: 5 [IU] via SUBCUTANEOUS
  Administered 2020-04-15: 2 [IU] via SUBCUTANEOUS
  Administered 2020-04-15: 1 [IU] via SUBCUTANEOUS
  Administered 2020-04-15: 4 [IU] via SUBCUTANEOUS
  Administered 2020-04-15: 2 [IU] via SUBCUTANEOUS
  Administered 2020-04-16: 1 [IU] via SUBCUTANEOUS
  Administered 2020-04-16: 2 [IU] via SUBCUTANEOUS

## 2020-04-14 MED ORDER — HALOPERIDOL LACTATE 5 MG/ML IJ SOLN
2.0000 mg | Freq: Once | INTRAMUSCULAR | Status: AC
  Administered 2020-04-14: 2 mg via INTRAMUSCULAR
  Filled 2020-04-14: qty 1

## 2020-04-14 MED ORDER — ACETAMINOPHEN 650 MG RE SUPP: 650.0000 mg | Freq: Four times a day (QID) | RECTAL | Status: DC | PRN

## 2020-04-14 MED ORDER — SODIUM CHLORIDE 0.9 % IV SOLN
8.0000 mg/h | INTRAVENOUS | Status: DC
  Administered 2020-04-14 – 2020-04-15 (×2): 8 mg/h via INTRAVENOUS
  Filled 2020-04-14 (×2): qty 80

## 2020-04-14 MED ORDER — SODIUM CHLORIDE 0.9 % IV SOLN: INTRAVENOUS | Status: DC

## 2020-04-14 MED ORDER — ALBUTEROL SULFATE (2.5 MG/3ML) 0.083% IN NEBU: 2.5000 mg | INHALATION_SOLUTION | Freq: Four times a day (QID) | RESPIRATORY_TRACT | Status: DC | PRN

## 2020-04-14 MED ORDER — PANTOPRAZOLE SODIUM 40 MG IV SOLR: 40.0000 mg | Freq: Two times a day (BID) | INTRAVENOUS | Status: DC

## 2020-04-14 MED ORDER — SODIUM CHLORIDE 0.9 % IV BOLUS
500.0000 mL | Freq: Once | INTRAVENOUS | Status: AC
  Administered 2020-04-14: 500 mL via INTRAVENOUS

## 2020-04-14 NOTE — Progress Notes (Signed)
Cross-coverage note:   Patient confused and agitated. Family contacted by RN. QT interval was 352. Recommend continued non-pharmacologic interventions, safety sitter, and as-needed Haldol.

## 2020-04-14 NOTE — ED Provider Notes (Signed)
[facility] EMERGENCY DEPARTMENT Provider Note   CSN: [id number] Arrival date & time: 04/14/20  4196     History No chief complaint on file.   William Acevedo is a 65 y.o. male.  HPI   65 year old male with past medical history of chronic abdominal pain, previous CVA, renal infarct, polysubstance and cocaine abuse, HTN, DM presents to the emergency department with weakness and nausea/vomiting.  Patient is a poor historian, confused at time, slow to answer questions.  Currently he is complaining of epigastric discomfort and vomiting.  He states he has not been able to tolerate his oral home medication regimen.  He states he feels fatigued and weak.  There was report of slurred speech at home.  He denies any fever, chest pain, diarrhea.  Of note patient was recently diagnosed with shingles, reportedly on antiviral.  Past Medical History:  Diagnosis Date  . Carpal tunnel syndrome   . Depression   . Diabetes mellitus without complication ([place])   . Neuropathy     Patient Active Problem List   Diagnosis Date Noted  . Abdominal pain 04/02/2020  . Protein-calorie malnutrition, severe 03/27/2020  . Cerebral thrombosis with cerebral infarction 03/26/2020  . NSTEMI (non-ST elevated myocardial infarction) ([place]) 03/25/2020  . Secondary diabetes mellitus with HHNC (hyperglycemia hyperosmolar non-ketotic coma) ([place]) 03/25/2020  . Elevated troponin   . Renal infarct ([place])   . Cerebrovascular accident (CVA) due to embolism of cerebral artery ([place])   . Cocaine use disorder, severe, dependence ([place]) 04/08/2019  . Major depressive disorder 04/08/2019  . Epiglottitis 04/07/2019  . Cocaine use 04/07/2019  . Acute encephalopathy 04/07/2019  . Overdose 07/06/2015  . AKI (acute kidney injury) ([place]) 07/06/2015  . Polysubstance abuse ([place]) 07/06/2015  . DM2 (diabetes mellitus, type 2) ([place]) 07/06/2015  . Dehydration   . MDD (major depressive disorder), recurrent episode, mild  ([place])   . Uncomplicated alcohol dependence ([place])     Past Surgical History:  Procedure Laterality Date  . BUBBLE STUDY  03/30/2020   Procedure: BUBBLE STUDY;  Surgeon: Jerline Pain, MD;  Location: [place];  Service: Cardiovascular;;  . TEE WITHOUT CARDIOVERSION N/A 03/30/2020   Procedure: TRANSESOPHAGEAL ECHOCARDIOGRAM (TEE);  Surgeon: Jerline Pain, MD;  Location: [facility] ENDOSCOPY;  Service: Cardiovascular;  Laterality: N/A;  . TESTICLE TORSION REDUCTION         History reviewed. No pertinent family history.  Social History   Tobacco Use  . Smoking status: Current Every Day Smoker    Packs/day: 0.50    Types: Cigarettes  . Smokeless tobacco: Never Used  Substance Use Topics  . Alcohol use: Yes  . Drug use: Yes    Types: Cocaine    Home Medications Prior to Admission medications   Medication Sig Start Date End Date Taking? Authorizing Provider  acetaminophen (TYLENOL) 325 MG tablet Take 2 tablets (650 mg total) by mouth every 6 (six) hours as needed for mild pain (or Fever >/= 101). 04/05/20   Oswald Hillock, MD  amitriptyline (ELAVIL) 10 MG tablet Take 20 mg by mouth at bedtime.    [provider]  aspirin EC 81 MG EC tablet Take 1 tablet (81 mg total) by mouth daily. Swallow whole. 03/31/20 04/30/20  Arrien, Jimmy Picket, MD  atorvastatin (LIPITOR) 20 MG tablet Take 20 mg by mouth daily.    [provider]  blood glucose meter kit and supplies KIT Dispense based on patient and insurance preference. Use up to four times  daily as directed. (FOR ICD-9 250.00, 250.01). 09/13/19   Isla Pence, MD  cyclobenzaprine (FLEXERIL) 5 MG tablet Take 1 tablet (5 mg total) by mouth 3 (three) times daily as needed for muscle spasms. 03/30/20   Arrien, Jimmy Picket, MD  dicyclomine (BENTYL) 20 MG tablet Take 1 tablet (20 mg total) by mouth 3 (three) times daily before meals. 04/07/20   Tedd Sias, PA  docusate sodium (COLACE) 100 MG capsule Take 1 capsule (100 mg  total) by mouth 2 (two) times daily. 04/05/20   Oswald Hillock, MD  empagliflozin (JARDIANCE) 25 MG TABS tablet Take 25 mg by mouth daily.    [provider]  famotidine (PEPCID) 20 MG tablet Take 1 tablet (20 mg total) by mouth daily. 04/05/20 08/03/20  Oswald Hillock, MD  feeding supplement (ENSURE ENLIVE / ENSURE PLUS) LIQD Take 237 mLs by mouth 3 (three) times daily between meals. 03/30/20 04/29/20  Arrien, Jimmy Picket, MD  gabapentin (NEURONTIN) 600 MG tablet Take 1,200 mg by mouth at bedtime.    [provider]  glipiZIDE (GLUCOTROL) 10 MG tablet Take 1 tablet (10 mg total) by mouth daily before breakfast. Patient taking differently: Take 10 mg by mouth daily. 08/24/19   [name], PA-C  HYDROmorphone (DILAUDID) 2 MG tablet Take 1 tablet (2 mg total) by mouth every 4 (four) hours as needed for severe pain. 04/11/20   Sherwood Gambler, MD  insulin aspart (NOVOLOG FLEXPEN) 100 UNIT/ML FlexPen Inject 5 Units into the skin 3 (three) times daily with meals. 04/05/20   Oswald Hillock, MD  insulin glargine (LANTUS) 100 UNIT/ML Solostar Pen Inject 40 Units into the skin daily. 08/24/19   [name], PA-C  Insulin Pen Needle 29G X 12MM MISC Per instructions 04/08/19   [name], MD  melatonin 3 MG TABS tablet Take 12 mg by mouth at bedtime.    [provider]  metoprolol tartrate (LOPRESSOR) 50 MG tablet Take 1 tablet (50 mg total) by mouth 2 (two) times daily. 04/05/20   Oswald Hillock, MD  ondansetron (ZOFRAN ODT) 4 MG disintegrating tablet Take 1 tablet (4 mg total) by mouth every 8 (eight) hours as needed for nausea or vomiting. 04/11/20   Sherwood Gambler, MD  polyethylene glycol (MIRALAX / GLYCOLAX) 17 g packet Take 17 g by mouth daily as needed for moderate constipation. 03/30/20   Arrien, Jimmy Picket, MD  sucralfate (CARAFATE) 1 g tablet Take 1 tablet (1 g total) by mouth 2 (two) times daily. 04/05/20 05/05/20  Oswald Hillock, MD  tadalafil (CIALIS) 10 MG  tablet Take 20 mg by mouth daily as needed for erectile dysfunction.    [provider]  valACYclovir (VALTREX) 1000 MG tablet Take 1 tablet (1,000 mg total) by mouth 3 (three) times daily. 04/11/20   Sherwood Gambler, MD    Allergies    Trazodone, Lisinopril, and Omeprazole  Review of Systems   Review of Systems  Constitutional: Positive for appetite change and fatigue. Negative for chills and fever.  HENT: Negative for congestion.   Eyes: Negative for visual disturbance.  Respiratory: Negative for shortness of breath.   Cardiovascular: Negative for chest pain.  Gastrointestinal: Negative for abdominal pain, diarrhea and vomiting.  Genitourinary: Negative for difficulty urinating.  Musculoskeletal: Negative for back pain and neck pain.  Skin: Negative for rash.  Neurological: Negative for facial asymmetry and headaches.    Physical Exam Updated Vital Signs BP (!) 184/96   Pulse 93  Temp 98.1 F (36.7 C) (Oral)   Resp 18   Ht _0  (1.676 m)   Wt 56 kg   SpO2 100%   BMI 19.93 kg/m   Physical Exam Vitals and nursing note reviewed.  Constitutional:      Appearance: He is ill-appearing.     Comments: Thin and frail appearing  HENT:     Head: Normocephalic.     Mouth/Throat:     Mouth: Mucous membranes are moist.  Cardiovascular:     Rate and Rhythm: Normal rate.  Pulmonary:     Effort: Pulmonary effort is normal. No respiratory distress.  Abdominal:     Palpations: Abdomen is soft.     Tenderness: There is no abdominal tenderness.  Skin:    General: Skin is warm.  Neurological:     Mental Status: He is alert and oriented to person, place, and time. Mental status is at baseline.     Comments: Sometimes slow to answer questions but otherwise appropriate, speech is slow and confused but not necessarily slurred, no aphasia  Psychiatric:        Mood and Affect: Mood normal.     ED Results / Procedures / Treatments   Labs (all labs ordered are listed, but  only abnormal results are displayed) Labs Reviewed  CBC WITH DIFFERENTIAL/PLATELET - Abnormal; Notable for the following components:      Result Value   Hemoglobin 12.6 (*)    HCT 36.9 (*)    Lymphs Abs 0.5 (*)    All other components within normal limits  COMPREHENSIVE METABOLIC PANEL - Abnormal; Notable for the following components:   Sodium 124 (*)    Chloride 87 (*)    Glucose, Bld 403 (*)    BUN 47 (*)    Creatinine, Ser 5.07 (*)    Calcium 8.6 (*)    Total Protein 6.4 (*)    Albumin 3.2 (*)    GFR, Estimated 12 (*)    All other components within normal limits  LIPASE, BLOOD  RAPID URINE DRUG SCREEN, HOSP PERFORMED  URINALYSIS, ROUTINE W REFLEX MICROSCOPIC    EKG None  Radiology CT Head Wo Contrast  Result Date: 04/14/2020 CLINICAL DATA:  Altered mental status. EXAM: CT HEAD WITHOUT CONTRAST TECHNIQUE: Contiguous axial images were obtained from the base of the skull through the vertex without intravenous contrast. COMPARISON:  March 25, 2020. FINDINGS: Brain: Mild chronic ischemic white matter disease is noted. Old left occipital infarction is noted. No mass effect or midline shift is noted. Ventricular size is within normal limits. There is no evidence of mass lesion, hemorrhage or acute infarction. Vascular: No hyperdense vessel or unexpected calcification. Skull: Normal. Negative for fracture or focal lesion. Sinuses/Orbits: No acute finding. Other: None. IMPRESSION: Mild chronic ischemic white matter disease. Old left occipital infarction. No acute intracranial abnormality seen. Electronically Signed   By: Marijo Conception M.D.   On: 04/14/2020 12:40    Procedures Procedures   Medications Ordered in ED Medications  haloperidol lactate (HALDOL) injection 2 mg (2 mg Intramuscular Given 04/14/20 1135)  famotidine (PEPCID) IVPB 20 mg premix (0 mg Intravenous Stopped 04/14/20 1222)  morphine 4 MG/ML injection 4 mg (4 mg Intravenous Given 04/14/20 1130)  sodium chloride 0.9 %  bolus 500 mL (500 mLs Intravenous New Bag/Given 04/14/20 1135)    ED Course  I have reviewed the triage vital signs and the nursing notes.  Pertinent labs & imaging results that were available during my care  of the patient were reviewed by me and considered in my medical decision making (see chart for details).    MDM Rules/Calculators/A&P                          65 year old male presents the emergency department with concern for nausea/vomiting.  He also has intermittent confusion.  There was concern for slurred speech but to me he does have slow confused speech.     CT of the head shows old findings, no acute pathology.  Blood work shows acute renal failure, when compared to normal renal function 3 days ago.  There is mild hyponatremia.  Patient is most likely confused secondary to uremia.  Patient has known renal infarction on previous CT scans.  CT of the abdomen pelvis without contrast ordered.  Nephrology, Dr. Johnney Ou consulted in regards to the renal failure and further treatments.  Patient has urinary retention, Foley placed, urinalysis shows no UTI, UDS is positive for opiates and cocaine.  Patients evaluation and results requires admission for further treatment and care. Patient agrees with admission plan, offers no new complaints and is stable/unchanged at time of admit.  Final Clinical Impression(s) / ED Diagnoses Final diagnoses:  None    Rx / DC Orders ED Discharge Orders    None       Lorelle Gibbs, DO 04/14/20 1556

## 2020-04-14 NOTE — H&P (Addendum)
History and Physical    William Acevedo CVE:[id number] DOB: 07-31-1955 DOA: 04/14/2020  Referring MD/NP/PA: Lavenia Atlas, MD PCP: Olegario Messier, MD  Patient coming from: Home(lives with brother) via EMS  Chief Complaint: Nausea and vomiting  I have personally briefly reviewed patient's old medical records in [place]   HPI: William Acevedo is a 65 y.o. male with medical history significant of hypertension, hyperlipidemia, CVA, uncontrolled diabetes mellitus type 2, renal infarct major depressive disorder, and polysubstance abuse(cocaine and marijuana) presents with complaints of weakness with nausea and vomiting.  Patient is a poor historian and history is obtained from records and talks with the patient's brother over the phone.  Patient complains of having abdominal and right chest wall pain.  His brother states that he has been unable to eat or drink for the last 3-4 week due to persistent nausea and vomiting. Bright red blood has been intermittently present in emesis. He has lost a significant amount of amount of weight due to his symptoms. He had just recently been seen in the emergency department 3 days ago with complaints of right chest wall pain and had been diagnosed with herpes zoster.  With from 3/17-3/20 after presenting with complaints of history of abdominal pain found to have multifocal ischemia within both cerebellar hemispheres, right occipital lobe, and both frontal lobes.  TEE showed a small mitral valve calcification versus vegetation and small PFO, but no definite clot.  He had been started on aspirin and Pepcid for his symptoms.  GI had also been formally consulted during that time but recommended outpatient work-up EGD and colonoscopy as patient had never had one before.  Patient reports that he last possibly used cocaine 2 weeks ago.  ED Course: Upon admission into the emergency department patient was seen to be afebrile, pulse 90-102, respirations 11-21,  blood pressures 151/79-180 5/96, and O2 saturations maintained on room air.  Labs significant for hemoglobin 12.6, sodium 124 BUN 47, creatinine 5.07, glucose 403, and anion gap 11.  CT scan of the abdomen pelvis significant for trace free pelvic fluid and new bilateral appearing renal stranding without hydronephrosis or hydroureter and punctate nonobstructive renal calculi in the right and possibly in the left kidney and prominent stool throughout colon favoring constipation.  Patient has been given 500 mL bolus of normal saline IV fluids, morphine, Haldol, and Pepcid IV.  Review of Systems  Unable to perform ROS: Medical condition  Gastrointestinal: Positive for abdominal pain, nausea and vomiting.    Past Medical History:  Diagnosis Date  . Carpal tunnel syndrome   . Depression   . Diabetes mellitus without complication ([place])   . Neuropathy     Past Surgical History:  Procedure Laterality Date  . BUBBLE STUDY  03/30/2020   Procedure: BUBBLE STUDY;  Surgeon: Jerline Pain, MD;  Location: [facility];  Service: Cardiovascular;;  . TEE WITHOUT CARDIOVERSION N/A 03/30/2020   Procedure: TRANSESOPHAGEAL ECHOCARDIOGRAM (TEE);  Surgeon: Jerline Pain, MD;  Location[facility] ENDOSCOPY;  Service: Cardiovascular;  Laterality: N/A;  . TESTICLE TORSION REDUCTION       reports that he has been smoking cigarettes. He has been smoking about 0.50 packs per day. He has never used smokeless tobacco. He reports current alcohol use. He reports current drug use. Drug: Cocaine.  Allergies  Allergen Reactions  . Trazodone     Other reaction(s): Priapism  . Lisinopril Swelling    angioedema  . Omeprazole     Other reaction(s): ANGIOEDEMA  OF LIPS, Angioedema of tongue.    History reviewed. No pertinent family history.  Prior to Admission medications   Medication Sig Start Date End Date Taking? Authorizing Provider  acetaminophen (TYLENOL) 325 MG tablet Take 2 tablets (650 mg total) by mouth every 6  (six) hours as needed for mild pain (or Fever >/= 101). 04/05/20   Oswald Hillock, MD  amitriptyline (ELAVIL) 10 MG tablet Take 20 mg by mouth at bedtime.    [provider]  aspirin EC 81 MG EC tablet Take 1 tablet (81 mg total) by mouth daily. Swallow whole. 03/31/20 04/30/20  Arrien, Jimmy Picket, MD  atorvastatin (LIPITOR) 20 MG tablet Take 20 mg by mouth daily.    [provider]  blood glucose meter kit and supplies KIT Dispense based on patient and insurance preference. Use up to four times daily as directed. (FOR ICD-9 250.00, 250.01). 09/13/19   Isla Pence, MD  cyclobenzaprine (FLEXERIL) 5 MG tablet Take 1 tablet (5 mg total) by mouth 3 (three) times daily as needed for muscle spasms. 03/30/20   Arrien, Jimmy Picket, MD  dicyclomine (BENTYL) 20 MG tablet Take 1 tablet (20 mg total) by mouth 3 (three) times daily before meals. 04/07/20   Tedd Sias, PA  docusate sodium (COLACE) 100 MG capsule Take 1 capsule (100 mg total) by mouth 2 (two) times daily. 04/05/20   Oswald Hillock, MD  empagliflozin (JARDIANCE) 25 MG TABS tablet Take 25 mg by mouth daily.    [provider]  famotidine (PEPCID) 20 MG tablet Take 1 tablet (20 mg total) by mouth daily. 04/05/20 08/03/20  Oswald Hillock, MD  feeding supplement (ENSURE ENLIVE / ENSURE PLUS) LIQD Take 237 mLs by mouth 3 (three) times daily between meals. 03/30/20 04/29/20  Arrien, Jimmy Picket, MD  gabapentin (NEURONTIN) 600 MG tablet Take 1,200 mg by mouth at bedtime.    [provider]  glipiZIDE (GLUCOTROL) 10 MG tablet Take 1 tablet (10 mg total) by mouth daily before breakfast. Patient taking differently: Take 10 mg by mouth daily. 08/24/19   [name], PA-C  HYDROmorphone (DILAUDID) 2 MG tablet Take 1 tablet (2 mg total) by mouth every 4 (four) hours as needed for severe pain. 04/11/20   Sherwood Gambler, MD  insulin aspart (NOVOLOG FLEXPEN) 100 UNIT/ML FlexPen Inject 5 Units into the skin 3  (three) times daily with meals. 04/05/20   Oswald Hillock, MD  insulin glargine (LANTUS) 100 UNIT/ML Solostar Pen Inject 40 Units into the skin daily. 08/24/19   [name], PA-C  Insulin Pen Needle 29G X 12MM MISC Per instructions 04/08/19   [name], MD  melatonin 3 MG TABS tablet Take 12 mg by mouth at bedtime.    [provider]  metoprolol tartrate (LOPRESSOR) 50 MG tablet Take 1 tablet (50 mg total) by mouth 2 (two) times daily. 04/05/20   Oswald Hillock, MD  ondansetron (ZOFRAN ODT) 4 MG disintegrating tablet Take 1 tablet (4 mg total) by mouth every 8 (eight) hours as needed for nausea or vomiting. 04/11/20   Sherwood Gambler, MD  polyethylene glycol (MIRALAX / GLYCOLAX) 17 g packet Take 17 g by mouth daily as needed for moderate constipation. 03/30/20   Arrien, Jimmy Picket, MD  sucralfate (CARAFATE) 1 g tablet Take 1 tablet (1 g total) by mouth 2 (two) times daily. 04/05/20 05/05/20  Oswald Hillock, MD  tadalafil (CIALIS) 10 MG tablet Take 20 mg by mouth daily  as needed for erectile dysfunction.    [provider]  valACYclovir (VALTREX) 1000 MG tablet Take 1 tablet (1,000 mg total) by mouth 3 (three) times daily. 04/11/20   Sherwood Gambler, MD    Physical Exam:  Constitutional: elderly male who is altered but able to follow commands Vitals:   04/14/20 1345 04/14/20 1430 04/14/20 1445 04/14/20 1500  BP:    (!) 162/84  Pulse: 94 96 96 92  Resp: 16 12 17 13   Temp:      TempSrc:      SpO2: 98% 99% 100% 99%  Weight:      Height:       Eyes: PERRL, lids and conjunctivae normal ENMT: Mucous membranes are dry. Posterior pharynx clear of any exudate or lesions.   Neck: normal, supple, no masses, no thyromegaly Respiratory: clear to auscultation bilaterally, no wheezing, no crackles. Normal respiratory effort. No accessory muscle use.  Cardiovascular: Regular rate and rhythm, no murmurs / rubs / gallops. No extremity edema. 2+ pedal pulses. No carotid bruits.   Abdomen: no tenderness, no masses palpated. No hepatosplenomegaly. Bowel sounds positive.  Rectal exam: No hemorrhoids appreciated.  Appear to have brown stool, but tested guaiac positive. Musculoskeletal: no clubbing / cyanosis. No joint deformity upper and lower extremities. Good ROM, no contractures. Normal muscle tone.  Skin: Dermatomal rash of the upper right side of the back with crusting lesions.  Poor skin turgor. Neurologic: CN 2-12 grossly intact.  Speech is intermittently slurred.  Right arm weakness appreciated. Psychiatric:  Alert and oriented x person and place.    Labs on Admission: I have personally reviewed following labs and imaging studies  CBC: Recent Labs  Lab 04/11/20 1757 04/14/20 1043  WBC 6.5 7.2  NEUTROABS 4.0 5.9  HGB 15.4 12.6*  HCT 45.8 36.9*  MCV 85.8 83.3  PLT 307 361   Basic Metabolic Panel: Recent Labs  Lab 04/11/20 1757 04/14/20 1043  NA 130* 124*  K 3.4* 4.7  CL 90* 87*  CO2 27 26  GLUCOSE 164* 403*  BUN 24* 47*  CREATININE 1.20 5.07*  CALCIUM 9.6 8.6*   GFR: Estimated Creatinine Clearance: 11.4 mL/min (A) (by C-G formula based on SCr of 5.07 mg/dL (H)). Liver Function Tests: Recent Labs  Lab 04/11/20 1757 04/14/20 1043  AST 27 27  ALT 24 18  ALKPHOS 57 56  BILITOT 0.9 0.6  PROT 7.5 6.4*  ALBUMIN 3.8 3.2*   Recent Labs  Lab 04/11/20 1757 04/14/20 1043  LIPASE 19 41   No results for input(s): AMMONIA in the last 168 hours. Coagulation Profile: No results for input(s): INR, PROTIME in the last 168 hours. Cardiac Enzymes: No results for input(s): CKTOTAL, CKMB, CKMBINDEX, TROPONINI in the last 168 hours. BNP (last 3 results) No results for input(s): PROBNP in the last 8760 hours. HbA1C: No results for input(s): HGBA1C in the last 72 hours. CBG: No results for input(s): GLUCAP in the last 168 hours. Lipid Profile: No results for input(s): CHOL, HDL, LDLCALC, TRIG, CHOLHDL, LDLDIRECT in the last 72 hours. Thyroid  Function Tests: No results for input(s): TSH, T4TOTAL, FREET4, T3FREE, THYROIDAB in the last 72 hours. Anemia Panel: No results for input(s): VITAMINB12, FOLATE, FERRITIN, TIBC, IRON, RETICCTPCT in the last 72 hours. Urine analysis:    Component Value Date/Time   COLORURINE YELLOW 04/07/2020 1200   APPEARANCEUR CLEAR 04/07/2020 1200   LABSPEC 1.029 04/07/2020 1200   PHURINE 6.0 04/07/2020 1200   GLUCOSEU >=500 (A) 04/07/2020 1200  HGBUR NEGATIVE 04/07/2020 1200   BILIRUBINUR NEGATIVE 04/07/2020 1200   KETONESUR 20 (A) 04/07/2020 1200   PROTEINUR 100 (A) 04/07/2020 1200   NITRITE NEGATIVE 04/07/2020 1200   LEUKOCYTESUR NEGATIVE 04/07/2020 1200   Sepsis Labs: No results found for this or any previous visit (from the past 240 hour(s)).   Radiological Exams on Admission: CT ABDOMEN PELVIS WO CONTRAST  Result Date: 04/14/2020 CLINICAL DATA:  Acute abdominal pain.  New renal failure. EXAM: CT ABDOMEN AND PELVIS WITHOUT CONTRAST TECHNIQUE: Multidetector CT imaging of the abdomen and pelvis was performed following the standard protocol without IV contrast. COMPARISON:  04/02/2020 FINDINGS: Lower chest: Right coronary artery and descending thoracic aortic atherosclerotic calcification. Calcification along the mitral valve. No overt cardiomegaly. Minimal dependent subsegmental atelectasis in both lower lobes. Hepatobiliary: Unremarkable Pancreas: Unremarkable Spleen: Unremarkable Adrenals/Urinary Tract: Both adrenal glands appear normal. Five punctate calcifications suspicious for nonobstructive renal calculi in the right kidney, measuring up to 0.3 cm in diameter. Questionable punctate 1-2 mm left mid to lower renal calculi. Faint bilateral perirenal stranding appears increased from prior. Small hypodense lesion in the right mid kidney appears stable. No hydronephrosis or hydroureter. The urinary bladder appears unremarkable. Stomach/Bowel: Prominent stool throughout the colon favors constipation.  No dilated small bowel identified. The appendix is indistinct. Paucity of intra-adipose tissue making separation of loops problematic. Vascular/Lymphatic: Dense aortoiliac atherosclerotic vascular calcification noted. Reproductive: Unremarkable Other: Trace free pelvic fluid for example eccentric to the right on image 65 series 3, increased from 04/02/2020. Cannot exclude low-grade mesenteric edema given the paucity of intra-adipose tissue. Musculoskeletal: Mild bilateral foraminal stenosis at L4-5 due to disc bulge and facet arthropathy. IMPRESSION: 1. New trace free pelvic fluid in new mild bilateral perirenal stranding without hydronephrosis or hydroureter. 2. Punctate nonobstructive renal calculi on the right and possibly the left. 3. Nonvisualization of the appendix on today's exam. 4.  Prominent stool throughout the colon favors constipation. 5. Other imaging findings of potential clinical significance: Aortic Atherosclerosis (ICD10-I70.0). Coronary atherosclerosis. Mitral valve calcification. Mild bilateral foraminal impingement at L4-5. Electronically Signed   By: Van Clines M.D.   On: 04/14/2020 14:59   CT Head Wo Contrast  Result Date: 04/14/2020 CLINICAL DATA:  Altered mental status. EXAM: CT HEAD WITHOUT CONTRAST TECHNIQUE: Contiguous axial images were obtained from the base of the skull through the vertex without intravenous contrast. COMPARISON:  March 25, 2020. FINDINGS: Brain: Mild chronic ischemic white matter disease is noted. Old left occipital infarction is noted. No mass effect or midline shift is noted. Ventricular size is within normal limits. There is no evidence of mass lesion, hemorrhage or acute infarction. Vascular: No hyperdense vessel or unexpected calcification. Skull: Normal. Negative for fracture or focal lesion. Sinuses/Orbits: No acute finding. Other: None. IMPRESSION: Mild chronic ischemic white matter disease. Old left occipital infarction. No acute intracranial  abnormality seen. Electronically Signed   By: Marijo Conception M.D.   On: 04/14/2020 12:40    EKG: Independently reviewed.  Sinus tachycardia 109 bpm  Assessment/Plan Acute renal failure: Patient creatinine elevated up to 5.07 with BUN 47.  Previously patient to just 3 days ago.  Patient reportedly has been having persistent nausea and vomiting for the last 3 to 4 weeks.  Suspected prerenal cause of symptoms given elevated BUN to creatinine ratio.  Foley catheter was placed.  Urinalysis did not show clear signs of infection.  -Admit to a medical telemetry bed -Strict intake and output -Avoid nephrotoxic agents -Check urine creatinine and urine sodium -  Continue Foley catheter -Normal saline IV fluids at 100 mL/h -Serial monitoring of kidney function  Nausea, vomiting, and abdominal pain Acute blood loss anemia secondary to GI bleed: Patient's brother notes that the patient has had persistent nausea and vomiting over the last 3 to 4 weeks with complaints of abdominal pain.  Emesis has reportedly had blood present.  He had just recently been started on aspirin after having a stroke hemoglobin 12.6 g/dL which appears to be a drop of almost 3 g/dL over the last 3 days.  Stool guaiacs were noted to be positive.  Patient had initially been given Pepcid 20 mg IV. -Type and screen for possible need of blood product -Protonix drip IV -[facility] consulted, we will follow-up for further recommendations  HHS in diabetes mellitus type 2: Acute.  On admission patient presents with glucose elevated up to 403 without elevated anion gap.  Serum osmolarity elevated at 302 and urinalysis positive for glucose, but negative for ketones.  Last hemoglobin A1c was 10.6 earlier this month. -Hypoglycemic protocols -Lantus 10 units daily -CBGs every 4 hours with very sensitive sliding scale insulin  -Adjust insulin regimen as needed  Hyponatremia: Acute on chronic.  Initial sodium level 124 on admission.  Suspecting  multifactorial in the setting of hyperglycemia and dehydration. -IV fluids as seen below -Serial monitoring of BMP  -Goal sodium correction no more than 10 mmol/L in a 24-hour.  Hypertensive urgency: Acute.  Blood pressures initially elevated up to 195/97.  Home medications appear to include metoprolol, but in the setting of cocaine abuse will hold.  -Hydralazine 25 mg po every 8 hours -Hydralazine IV as needed elevated blood pressures  Acute metabolic encephalopathy: Patient noted to be acutely altered and intermittently confused.  Of note he had recent stroke showing multiple infarcts.  UDS positive for cocaine which may be contributing. -Neurochecks -May warrant further work-up with MRI  Herpes zoster: Present prior to arrival.  Patient had recent been diagnosed with herpes zoster of the right back and chest wall 3 days ago and started on valacyclovir. -Continue valacyclovir when able  History of CVA: MRI from 3/9 noted multifocal acute ischemia with in both cerebellar hemispheres the right occipital lobe, and both frontal lobes.  Patient had undergone a TEE that gave concern for the possibility of possible small density on the mitral valve concerning for possible vegetation and concern for small PFO.  Blood cultures during that hospitalization however were noted to be negative. -Held aspirin due to bleed -Continue statin  Polysubstance abuse: UDS positive for cocaine. - continue to counsel patient on need of cessation of cocaine abuse  DVT prophylaxis: SCDs Code Status: Full Family Communication: Patient's brother updated over the phone Disposition Plan: Possible discharge home once medically stable Consults called: Gastroenterology Admission status: Inpatient require more than 2 midnight stay  Norval Morton MD [facility]   If 7PM-7AM, please contact night-coverage   04/14/2020, 3:04 PM

## 2020-04-14 NOTE — Consult Note (Signed)
[facility]  INPATIENT CONSULTATION  Reason for Consultation: AKI Requesting Provider: Dr. Wilkie Aye  HPI: William Acevedo is an 65 y.o. male with h/o CVA, HTN, DM, ongoing tobacco abuse, depression who presented to the ED today with weakness and slurred speech x 2 days and is seen by nephrology for evaluation and management of AKI.   Pt doesn't give a good history but tells me he took a large amount of cocaine 3 days ago and hasn't been able to urinate since.  He says when he is high he doesn't eat or drink much.  He previously had normal UOP.  Also drinks EtOH daily.   He was seen in ED 3/29 was diagnosed with shingles and given Rx for valtrex 1g TID, zofran and dilaudid.  At that time Cr 1.2.  Labs today with Na 124, K 4.7, bicarb 26, BUN 47, Cr 5.07. CT with mild perirenal stranding without obstruction, possible very small stones, large stool burden. UA with glucose and 1+ protein.  Foley placed and no urine.    04/07/20 [facility] visit abd pain. 04/06/20 [facility] ED visit abd pain. 3/17-20 [facility] admission abd pain; outpt GI f/u  2/27 ED abd pain - possible renal infarct vs pyelo  PMH: Past Medical History:  Diagnosis Date  . Carpal tunnel syndrome   . Depression   . Diabetes mellitus without complication (HCC)   . Neuropathy    PSH: Past Surgical History:  Procedure Laterality Date  . BUBBLE STUDY  03/30/2020   Procedure: BUBBLE STUDY;  Surgeon: Jake Bathe, MD;  Location: [facility] ENDOSCOPY;  Service: Cardiovascular;;  . TEE WITHOUT CARDIOVERSION N/A 03/30/2020   Procedure: TRANSESOPHAGEAL ECHOCARDIOGRAM (TEE);  Surgeon: Jake Bathe, MD;  Location: [facility] ENDOSCOPY;  Service: Cardiovascular;  Laterality: N/A;  . TESTICLE TORSION REDUCTION      Past Medical History:  Diagnosis Date  . Carpal tunnel syndrome   . Depression   . Diabetes mellitus without complication (HCC)   . Neuropathy     Medications:  I have reviewed the patient's current medications.  (Not in a  hospital admission)   ALLERGIES:   Allergies  Allergen Reactions  . Trazodone     Other reaction(s): Priapism  . Lisinopril Swelling    angioedema  . Omeprazole     Other reaction(s): ANGIOEDEMA OF LIPS, Angioedema of tongue.    FAM HX: History reviewed. No pertinent family history.  Social History:   reports that he has been smoking cigarettes. He has been smoking about 0.50 packs per day. He has never used smokeless tobacco. He reports current alcohol use. He reports current drug use. Drug: Cocaine.  ROS: 12 system ROS per HPI above  Blood pressure (!) 171/87, pulse 92, temperature 98.1 F (36.7 C), temperature source Oral, resp. rate 14, height 5\' 6"  (1.676 m), weight 56 kg, SpO2 100 %. PHYSICAL EXAM: Gen: thin, frail, nontoxic but chronically ill appearing  Eyes: anicteric ENT: MM tacky Neck: supple CV: RRR, no rub  Abd:  Soft, mildly TTP throughout Lungs: clear GU: foley with no urine Extr: no edema Neuro: conversant, speech is a bit thick but mostly understandable Skin: warm and dry   Results for orders placed or performed during the hospital encounter of 04/14/20 (from the past 48 hour(s))  CBC with Differential     Status: Abnormal   Collection Time: 04/14/20 10:43 AM  Result Value Ref Range   WBC 7.2 4.0 - 10.5 K/uL   RBC 4.43 4.22 - 5.81  MIL/uL   Hemoglobin 12.6 (L) 13.0 - 17.0 g/dL   HCT 30.1 (L) 60.1 - 09.3 %   MCV 83.3 80.0 - 100.0 fL   MCH 28.4 26.0 - 34.0 pg   MCHC 34.1 30.0 - 36.0 g/dL   RDW 23.5 57.3 - 22.0 %   Platelets 179 150 - 400 K/uL   nRBC 0.0 0.0 - 0.2 %   Neutrophils Relative % 83 %   Neutro Abs 5.9 1.7 - 7.7 K/uL   Lymphocytes Relative 7 %   Lymphs Abs 0.5 (L) 0.7 - 4.0 K/uL   Monocytes Relative 10 %   Monocytes Absolute 0.7 0.1 - 1.0 K/uL   Eosinophils Relative 0 %   Eosinophils Absolute 0.0 0.0 - 0.5 K/uL   Basophils Relative 0 %   Basophils Absolute 0.0 0.0 - 0.1 K/uL   Immature Granulocytes 0 %   Abs Immature Granulocytes  0.03 0.00 - 0.07 K/uL    Comment: Performed at [facility], [address] [place], [place] [address]  Comprehensive metabolic panel     Status: Abnormal   Collection Time: 04/14/20 10:43 AM  Result Value Ref Range   Sodium 124 (L) 135 - 145 mmol/L   Potassium 4.7 3.5 - 5.1 mmol/L   Chloride 87 (L) 98 - 111 mmol/L   CO2 26 22 - 32 mmol/L   Glucose, Bld 403 (H) 70 - 99 mg/dL    Comment: Glucose reference range applies only to samples taken after fasting for at least 8 hours.   BUN 47 (H) 8 - 23 mg/dL   Creatinine, Ser 0.62 (H) 0.61 - 1.24 mg/dL   Calcium 8.6 (L) 8.9 - 10.3 mg/dL   Total Protein 6.4 (L) 6.5 - 8.1 g/dL   Albumin 3.2 (L) 3.5 - 5.0 g/dL   AST 27 15 - 41 U/L   ALT 18 0 - 44 U/L   Alkaline Phosphatase 56 38 - 126 U/L   Total Bilirubin 0.6 0.3 - 1.2 mg/dL   GFR, Estimated 12 (L) >60 mL/min    Comment: (NOTE) Calculated using the CKD-EPI Creatinine Equation (2021)    Anion gap 11 5 - 15    Comment: Performed at [facility], [address] [place], [place] [address]  Lipase, blood     Status: None   Collection Time: 04/14/20 10:43 AM  Result Value Ref Range   Lipase 41 11 - 51 U/L    Comment: Performed at [facility], [address], [place], [place] [address]  Urine rapid drug screen (hosp performed)     Status: Abnormal   Collection Time: 04/14/20  2:46 PM  Result Value Ref Range   Opiates POSITIVE (A) NONE DETECTED   Cocaine POSITIVE (A) NONE DETECTED   Benzodiazepines NONE DETECTED NONE DETECTED   Amphetamines NONE DETECTED NONE DETECTED   Tetrahydrocannabinol NONE DETECTED NONE DETECTED   Barbiturates NONE DETECTED NONE DETECTED    Comment: (NOTE) DRUG SCREEN FOR MEDICAL PURPOSES ONLY.  IF CONFIRMATION IS NEEDED FOR ANY PURPOSE, NOTIFY LAB WITHIN 5 DAYS.  LOWEST DETECTABLE LIMITS FOR URINE DRUG SCREEN Drug Class                     Cutoff (ng/mL) Amphetamine and metabolites    1000 Barbiturate and metabolites    200 Benzodiazepine                  200 Tricyclics and metabolites  300 Opiates and metabolites        300 Cocaine and metabolites        300 THC                            50 Performed at [facility], [address] [place], [place] [address]   Urinalysis, Routine w reflex microscopic     Status: Abnormal   Collection Time: 04/14/20  2:46 PM  Result Value Ref Range   Color, Urine STRAW (A) YELLOW   APPearance CLEAR CLEAR   Specific Gravity, Urine 1.005 1.005 - 1.030   pH 6.0 5.0 - 8.0   Glucose, UA >=500 (A) NEGATIVE mg/dL   Hgb urine dipstick SMALL (A) NEGATIVE   Bilirubin Urine NEGATIVE NEGATIVE   Ketones, ur NEGATIVE NEGATIVE mg/dL   Protein, ur 30 (A) NEGATIVE mg/dL   Nitrite NEGATIVE NEGATIVE   Leukocytes,Ua NEGATIVE NEGATIVE   RBC / HPF 0-5 0 - 5 RBC/hpf   WBC, UA 0-5 0 - 5 WBC/hpf   Bacteria, UA NONE SEEN NONE SEEN   Mucus PRESENT     Comment: Performed at [facility], [address] [place], [place] [address]    CT ABDOMEN PELVIS WO CONTRAST  Result Date: 04/14/2020 CLINICAL DATA:  Acute abdominal pain.  New renal failure. EXAM: CT ABDOMEN AND PELVIS WITHOUT CONTRAST TECHNIQUE: Multidetector CT imaging of the abdomen and pelvis was performed following the standard protocol without IV contrast. COMPARISON:  04/02/2020 FINDINGS: Lower chest: Right coronary artery and descending thoracic aortic atherosclerotic calcification. Calcification along the mitral valve. No overt cardiomegaly. Minimal dependent subsegmental atelectasis in both lower lobes. Hepatobiliary: Unremarkable Pancreas: Unremarkable Spleen: Unremarkable Adrenals/Urinary Tract: Both adrenal glands appear normal. Five punctate calcifications suspicious for nonobstructive renal calculi in the right kidney, measuring up to 0.3 cm in diameter. Questionable punctate 1-2 mm left mid to lower renal calculi. Faint bilateral perirenal stranding appears increased from prior. Small hypodense lesion in the right mid kidney  appears stable. No hydronephrosis or hydroureter. The urinary bladder appears unremarkable. Stomach/Bowel: Prominent stool throughout the colon favors constipation. No dilated small bowel identified. The appendix is indistinct. Paucity of intra-adipose tissue making separation of loops problematic. Vascular/Lymphatic: Dense aortoiliac atherosclerotic vascular calcification noted. Reproductive: Unremarkable Other: Trace free pelvic fluid for example eccentric to the right on image 65 series 3, increased from 04/02/2020. Cannot exclude low-grade mesenteric edema given the paucity of intra-adipose tissue. Musculoskeletal: Mild bilateral foraminal stenosis at L4-5 due to disc bulge and facet arthropathy. IMPRESSION: 1. New trace free pelvic fluid in new mild bilateral perirenal stranding without hydronephrosis or hydroureter. 2. Punctate nonobstructive renal calculi on the right and possibly the left. 3. Nonvisualization of the appendix on today's exam. 4.  Prominent stool throughout the colon favors constipation. 5. Other imaging findings of potential clinical significance: Aortic Atherosclerosis (ICD10-I70.0). Coronary atherosclerosis. Mitral valve calcification. Mild bilateral foraminal impingement at L4-5. Electronically Signed   By: Gaylyn RongWalter  Liebkemann M.D.   On: 04/14/2020 14:59   CT Head Wo Contrast  Result Date: 04/14/2020 CLINICAL DATA:  Altered mental status. EXAM: CT HEAD WITHOUT CONTRAST TECHNIQUE: Contiguous axial images were obtained from the base of the skull through the vertex without intravenous contrast. COMPARISON:  March 25, 2020. FINDINGS: Brain: Mild chronic ischemic white matter disease is noted. Old left occipital infarction is noted. No mass effect or midline shift is noted. Ventricular size is within normal limits. There is  no evidence of mass lesion, hemorrhage or acute infarction. Vascular: No hyperdense vessel or unexpected calcification. Skull: Normal. Negative for fracture or focal  lesion. Sinuses/Orbits: No acute finding. Other: None. IMPRESSION: Mild chronic ischemic white matter disease. Old left occipital infarction. No acute intracranial abnormality seen. Electronically Signed   By: Lupita Raider M.D.   On: 04/14/2020 12:40    Assessment/Plan **AKI, severe: Cr was 1.2 just 3 days prior. Ddx includes prerenal with poor po intake, HTN urgency with cocaine, valtrex toxicity or combination.  I would hold the valtrex for now (can cause AKI and AMS), hydrate overnight, trend renal function, maintain foley.  He may need RRT in the next 24-48h if not improving which I discussed with him.  Avoid nephrotoxins and avoid hypotension.   **AMS:  Fairly coherent and nonfocal on exam, speech is a bit difficult to understand but it's possible.  ? Valtrex, intoxication?   **HTN:  Chronic poorly controlled HTN; resume home meds with hold parameters to avoid drops in BP.   **Hyponatremia: appears hypovolemic; follow with resuscitation.   **DM: per primary  **EtoH, drug use - cocaine: monitor for withdrawal.   Will follow page with issues.  Tyler Pita 04/14/2020, 4:29 PM

## 2020-04-14 NOTE — ED Triage Notes (Signed)
Pt bib EMS from home (lives with brother) c/o of weakness and slurred speech x2 days. Pt not taking meds x2 days d/t N/V. Pt dx with shingles on lower back on Saturday. cbg 477 156/100 hr 102 98%.

## 2020-04-14 NOTE — ED Notes (Addendum)
Pt ems IV became disconnected resulting in some blood on arm/hand and shorts. This RN and NT cleaned the pt.

## 2020-04-14 NOTE — Plan of Care (Signed)
?  Problem: Clinical Measurements: ?Goal: Will remain free from infection ?Outcome: Progressing ?  ?

## 2020-04-14 NOTE — Progress Notes (Signed)
Inpatient Diabetes Program Recommendations  AACE/ADA: New Consensus Statement on Inpatient Glycemic Control (2015)  Target Ranges:  Prepandial:   less than 140 mg/dL      Peak postprandial:   less than 180 mg/dL (1-2 hours)      Critically ill patients:  140 - 180 mg/dL   Lab Results  Component Value Date   GLUCAP 150 (H) 04/05/2020   HGBA1C 10.6 (H) 03/26/2020   Review of Glycemic Control  Diabetes history: DM2 Outpatient Diabetes medications: Jardiance 25 mg daily, Glipizide 10 daily, Lantus 40 units daily, Novolog 5 units TID with meals Current orders for Inpatient glycemic control: None; in ED  Inpatient Diabetes Program Recommendations:    Insulin: If admitted, please consider ordering Lantus 15 units Q24H, CBGs AC&HS, Novolog 0-9 units TID with meals, Novolog 0-5 units QHS, and Novolog 3 units TID with meals if patient is ordered diet and eating at least 50% of meals.  HbgA1C:  A1C 10.6% on 03/26/20. Patient was seen by inpatient diabetes coordinator on 03/27/20 during prior admission.    Will follow.   Thank you. Ailene Ards, RD, LDN, CDE Inpatient Diabetes Coordinator [phone]

## 2020-04-14 NOTE — Progress Notes (Signed)
Notified MD T. Opyd of pt. severely agitated and not listening to RN instructions. PT. Not redirectable. RN spoke with pt family, encourage to come to bedside to assist with pt per Charge RN permitted for pt. Family to be present. Rn will monitor closely, and await orders.

## 2020-04-15 DIAGNOSIS — R109 Unspecified abdominal pain: Secondary | ICD-10-CM

## 2020-04-15 DIAGNOSIS — I160 Hypertensive urgency: Secondary | ICD-10-CM

## 2020-04-15 DIAGNOSIS — R112 Nausea with vomiting, unspecified: Secondary | ICD-10-CM

## 2020-04-15 DIAGNOSIS — G9341 Metabolic encephalopathy: Secondary | ICD-10-CM

## 2020-04-15 DIAGNOSIS — E860 Dehydration: Secondary | ICD-10-CM

## 2020-04-15 DIAGNOSIS — F1910 Other psychoactive substance abuse, uncomplicated: Secondary | ICD-10-CM

## 2020-04-15 DIAGNOSIS — K922 Gastrointestinal hemorrhage, unspecified: Secondary | ICD-10-CM

## 2020-04-15 LAB — BASIC METABOLIC PANEL
Anion gap: 11 (ref 5–15)
BUN: 49 mg/dL — ABNORMAL HIGH (ref 8–23)
CO2: 22 mmol/L (ref 22–32)
Calcium: 8.4 mg/dL — ABNORMAL LOW (ref 8.9–10.3)
Chloride: 95 mmol/L — ABNORMAL LOW (ref 98–111)
Creatinine, Ser: 5.38 mg/dL — ABNORMAL HIGH (ref 0.61–1.24)
GFR, Estimated: 11 mL/min — ABNORMAL LOW (ref >60)
Glucose, Bld: 328 mg/dL — ABNORMAL HIGH (ref 70–99)
Potassium: 4.6 mmol/L (ref 3.5–5.1)
Sodium: 128 mmol/L — ABNORMAL LOW (ref 135–145)

## 2020-04-15 LAB — GLUCOSE, CAPILLARY
Glucose-Capillary: 119 mg/dL — ABNORMAL HIGH (ref 70–99)
Glucose-Capillary: 133 mg/dL — ABNORMAL HIGH (ref 70–99)
Glucose-Capillary: 200 mg/dL — ABNORMAL HIGH (ref 70–99)
Glucose-Capillary: 205 mg/dL — ABNORMAL HIGH (ref 70–99)
Glucose-Capillary: 216 mg/dL — ABNORMAL HIGH (ref 70–99)
Glucose-Capillary: 317 mg/dL — ABNORMAL HIGH (ref 70–99)
Glucose-Capillary: 62 mg/dL — ABNORMAL LOW (ref 70–99)

## 2020-04-15 LAB — CBC
HCT: 36.9 % — ABNORMAL LOW (ref 39.0–52.0)
Hemoglobin: 12.7 g/dL — ABNORMAL LOW (ref 13.0–17.0)
MCH: 28.9 pg (ref 26.0–34.0)
MCHC: 34.4 g/dL (ref 30.0–36.0)
MCV: 83.9 fL (ref 80.0–100.0)
Platelets: 183 10*3/uL (ref 150–400)
RBC: 4.4 MIL/uL (ref 4.22–5.81)
RDW: 12.1 % (ref 11.5–15.5)
WBC: 8.1 10*3/uL (ref 4.0–10.5)
nRBC: 0 % (ref 0.0–0.2)

## 2020-04-15 MED ORDER — ENSURE ENLIVE PO LIQD
237.0000 mL | Freq: Three times a day (TID) | ORAL | Status: DC
  Administered 2020-04-15 – 2020-04-19 (×13): 237 mL via ORAL

## 2020-04-15 MED ORDER — SUCRALFATE 1 GM/10ML PO SUSP
1.0000 g | Freq: Two times a day (BID) | ORAL | Status: DC
  Administered 2020-04-15 – 2020-04-19 (×9): 1 g via ORAL
  Filled 2020-04-15 (×9): qty 10

## 2020-04-15 MED ORDER — ADULT MULTIVITAMIN W/MINERALS CH
1.0000 | ORAL_TABLET | Freq: Every day | ORAL | Status: DC
  Administered 2020-04-15 – 2020-04-19 (×5): 1 via ORAL
  Filled 2020-04-15 (×5): qty 1

## 2020-04-15 MED ORDER — PANTOPRAZOLE SODIUM 40 MG IV SOLR
40.0000 mg | Freq: Two times a day (BID) | INTRAVENOUS | Status: DC
  Administered 2020-04-15 – 2020-04-17 (×5): 40 mg via INTRAVENOUS
  Filled 2020-04-15 (×5): qty 40

## 2020-04-15 MED ORDER — CHLORHEXIDINE GLUCONATE CLOTH 2 % EX PADS
6.0000 | MEDICATED_PAD | Freq: Every day | CUTANEOUS | Status: DC
  Administered 2020-04-15 – 2020-04-17 (×3): 6 via TOPICAL

## 2020-04-15 NOTE — Progress Notes (Addendum)
[facility] Progress Note   Subjective:   Provides no relevant history and answers no questions.  Yelling for me to get out of room. UOP 1.6L yesterday.  No updated labs.   Objective Vitals:   04/14/20 2100 04/15/20 0029 04/15/20 0057 04/15/20 0437  BP: (!) 172/92 (!) 190/102 (!) 180/101 (!) 167/90  Pulse:  (!) 115  (!) 109  Resp:  17  17  Temp:  98 F (36.7 C)  98 F (36.7 C)  TempSrc:  Oral    SpO2:    96%  Weight:      Height:       Physical Exam General: angry, yelling in bed Heart: tachycardic on vitals - did not let me listen Lungs: normal WOB on RA Extremities: no visible edema GU: foley with clear yellow urine in bag  Additional Objective Labs: Basic Metabolic Panel: Recent Labs  Lab 04/11/20 1757 04/14/20 1043 04/14/20 1800  NA 130* 124* 125*  K 3.4* 4.7 4.5  CL 90* 87* 91*  CO2 27 26 24   GLUCOSE 164* 403* 424*  BUN 24* 47* 49*  CREATININE 1.20 5.07* 5.24*  CALCIUM 9.6 8.6* 8.3*   Liver Function Tests: Recent Labs  Lab 04/11/20 1757 04/14/20 1043  AST 27 27  ALT 24 18  ALKPHOS 57 56  BILITOT 0.9 0.6  PROT 7.5 6.4*  ALBUMIN 3.8 3.2*   Recent Labs  Lab 04/11/20 1757 04/14/20 1043  LIPASE 19 41   CBC: Recent Labs  Lab 04/11/20 1757 04/14/20 1043 04/14/20 1800 04/15/20 1103  WBC 6.5 7.2  --  8.1  NEUTROABS 4.0 5.9  --   --   HGB 15.4 12.6* 12.2* 12.7*  HCT 45.8 36.9* 35.1* 36.9*  MCV 85.8 83.3  --  83.9  PLT 307 179  --  183   Blood Culture    Component Value Date/Time   SDES BLOOD RIGHT ARM 03/26/2020 1805   SPECREQUEST  03/26/2020 1805    BOTTLES DRAWN AEROBIC ONLY Blood Culture adequate volume   CULT  03/26/2020 1805    NO GROWTH 5 DAYS Performed at [facility], [address] [place], [place]    REPTSTATUS 03/31/2020 FINAL 03/26/2020 1805    Cardiac Enzymes: Recent Labs  Lab 04/14/20 1600  CKTOTAL 171   CBG: Recent Labs  Lab 04/15/20 0024 04/15/20 0050 04/15/20 0438  04/15/20 0750 04/15/20 1145  GLUCAP 62* 133* 216* 119* 317*   Iron Studies: No results for input(s): IRON, TIBC, TRANSFERRIN, FERRITIN in the last 72 hours. @lablastinr3 @ Studies/Results: CT ABDOMEN PELVIS WO CONTRAST  Result Date: 04/14/2020 CLINICAL DATA:  Acute abdominal pain.  New renal failure. EXAM: CT ABDOMEN AND PELVIS WITHOUT CONTRAST TECHNIQUE: Multidetector CT imaging of the abdomen and pelvis was performed following the standard protocol without IV contrast. COMPARISON:  04/02/2020 FINDINGS: Lower chest: Right coronary artery and descending thoracic aortic atherosclerotic calcification. Calcification along the mitral valve. No overt cardiomegaly. Minimal dependent subsegmental atelectasis in both lower lobes. Hepatobiliary: Unremarkable Pancreas: Unremarkable Spleen: Unremarkable Adrenals/Urinary Tract: Both adrenal glands appear normal. Five punctate calcifications suspicious for nonobstructive renal calculi in the right kidney, measuring up to 0.3 cm in diameter. Questionable punctate 1-2 mm left mid to lower renal calculi. Faint bilateral perirenal stranding appears increased from prior. Small hypodense lesion in the right mid kidney appears stable. No hydronephrosis or hydroureter. The urinary bladder appears unremarkable. Stomach/Bowel: Prominent stool throughout the colon favors constipation. No dilated small bowel identified. The appendix is  indistinct. Paucity of intra-adipose tissue making separation of loops problematic. Vascular/Lymphatic: Dense aortoiliac atherosclerotic vascular calcification noted. Reproductive: Unremarkable Other: Trace free pelvic fluid for example eccentric to the right on image 65 series 3, increased from 04/02/2020. Cannot exclude low-grade mesenteric edema given the paucity of intra-adipose tissue. Musculoskeletal: Mild bilateral foraminal stenosis at L4-5 due to disc bulge and facet arthropathy. IMPRESSION: 1. New trace free pelvic fluid in new mild  bilateral perirenal stranding without hydronephrosis or hydroureter. 2. Punctate nonobstructive renal calculi on the right and possibly the left. 3. Nonvisualization of the appendix on today's exam. 4.  Prominent stool throughout the colon favors constipation. 5. Other imaging findings of potential clinical significance: Aortic Atherosclerosis (ICD10-I70.0). Coronary atherosclerosis. Mitral valve calcification. Mild bilateral foraminal impingement at L4-5. Electronically Signed   By: Gaylyn Rong M.D.   On: 04/14/2020 14:59   CT Head Wo Contrast  Result Date: 04/14/2020 CLINICAL DATA:  Altered mental status. EXAM: CT HEAD WITHOUT CONTRAST TECHNIQUE: Contiguous axial images were obtained from the base of the skull through the vertex without intravenous contrast. COMPARISON:  March 25, 2020. FINDINGS: Brain: Mild chronic ischemic white matter disease is noted. Old left occipital infarction is noted. No mass effect or midline shift is noted. Ventricular size is within normal limits. There is no evidence of mass lesion, hemorrhage or acute infarction. Vascular: No hyperdense vessel or unexpected calcification. Skull: Normal. Negative for fracture or focal lesion. Sinuses/Orbits: No acute finding. Other: None. IMPRESSION: Mild chronic ischemic white matter disease. Old left occipital infarction. No acute intracranial abnormality seen. Electronically Signed   By: Lupita Raider M.D.   On: 04/14/2020 12:40   Medications: . sodium chloride 100 mL/hr at 04/15/20 0600   . Chlorhexidine Gluconate Cloth  6 each Topical Daily  . feeding supplement  237 mL Oral TID BM  . hydrALAZINE  25 mg Oral Q8H  . insulin aspart  0-6 Units Subcutaneous Q4H  . insulin glargine  10 Units Subcutaneous QHS  . multivitamin with minerals  1 tablet Oral Daily  . pantoprazole  40 mg Intravenous Q12H  . sodium chloride flush  3 mL Intravenous Q12H  . sucralfate  1 g Oral BID    Assessment/Plan **AKI, severe: Cr was 1.2 just  3 days prior up to ~5 yesterday. Ddx includes prerenal with poor po intake, HTN urgency with cocaine, valtrex toxicity or combination.  I would hold the valtrex for now (can cause AKI and AMS), cont hydration with NS 100/hr , trend renal function, maintain foley.  He may need RRT in the next 24-48h if not improving which I discussed with him yesterday.  HIs UOP is encouraging but we need updated labs - encouraged him to allow them to be drawn; will defer to primary if medical therapy needed for agitation/withdraw.  Avoid nephrotoxins and avoid hypotension.   **AMS:  CT w/o acute issue.  Probably multifactorial, ? Valtrex toxicity with dysarthria but also poly substance abuse and Withdraw now.  Per primary.    **HTN:  Chronic poorly controlled HTN; resumed home meds with hold parameters to avoid drops in BP.   **Hyponatremia: appears hypovolemic; follow with resuscitation.   **DM: per primary  **EtoH, drug use - cocaine: monitor/treat for withdrawal.   Will follow page with issues.  Estill Bakes MD 04/15/2020, 11:54 AM  [facility] Pager: [phone]  ADDENDUM:  Labs returned na 128 from 125, BUN stable at 49 and Cr 5.38 from 5.2. Nonoliguric.  Continue plan per  above, no indications for dialysis currently.

## 2020-04-15 NOTE — Consult Note (Signed)
[facility] Consult  Referring Provider: Dr.Smith/[facility] Primary Care Physician:  Zekan, Patricia J, MD Primary Gastroenterologist: Unassigned(seen by us on 04/05/20)  Reason for Consultation: Emesis with small streaks of blood  HPI: William Acevedo is a 65 y.o. male is a very poor historian. Most of the history was obtained from patient's brother, [name] Ringenberg, over the phone, [phone].  It appears that patient has not been eating for the last 3 weeks and has been having several episodes of vomiting. Some of these episodes of vomiting was associated with small streaks of blood, small in amount and intermittent.  Patient's urine toxicology was positive for opioids and cocaine. His brother called EMS yesterday, when he noticed that his brother appeared very sick. Patient was discharged from the hospital on 04/05/2020. As per his brother he has had recent strokes and MI. As per discharge notes from 03/30/2020 he had acute ischemic infarct, multifocal, both cerebellar hemispheres, right occipital lobe and frontal lobes, was noted to have cocaine intoxication with elevated troponin, uncontrolled blood sugars with type 2 diabetes and acute kidney injury. Patient was advised for an outpatient EGD and colonoscopy.  On this admission he was noted to have creatinine of 5.07 with mild perirenal stranding, possible very small stools, has been seen by nephrology, recommended to hold Valtrex, hydrate overnight, trend renal function and maintain Foley with possible need for RRT in the next 2448 hrs. if not improving.    Past Medical History:  Diagnosis Date  . Carpal tunnel syndrome   . Depression   . Diabetes mellitus without complication (HCC)   . Hypertensive urgency 04/14/2020  . Neuropathy     Past Surgical History:  Procedure Laterality Date  . BUBBLE STUDY  03/30/2020   Procedure: BUBBLE STUDY;  Surgeon: Skains, Mark C, MD;  Location: [facility];  Service:  Cardiovascular;;  . TEE WITHOUT CARDIOVERSION N/A 03/30/2020   Procedure: TRANSESOPHAGEAL ECHOCARDIOGRAM (TEE);  Surgeon: Skains, Mark C, MD;  Location: [facility];  Service: Cardiovascular;  Laterality: N/A;  . TESTICLE TORSION REDUCTION      Prior to Admission medications   Medication Sig Start Date End Date Taking? Authorizing Provider  acetaminophen (TYLENOL) 325 MG tablet Take 2 tablets (650 mg total) by mouth every 6 (six) hours as needed for mild pain (or Fever >/= 101). 04/05/20  Yes Lama, Gagan S, MD  amitriptyline (ELAVIL) 10 MG tablet Take 20 mg by mouth at bedtime.   Yes [provider]  atorvastatin (LIPITOR) 20 MG tablet Take 20 mg by mouth daily.   Yes [provider]  cyclobenzaprine (FLEXERIL) 5 MG tablet Take 1 tablet (5 mg total) by mouth 3 (three) times daily as needed for muscle spasms. 03/30/20  Yes Arrien, Mauricio Daniel, MD  empagliflozin (JARDIANCE) 25 MG TABS tablet Take 25 mg by mouth daily.   Yes [provider]  famotidine (PEPCID) 20 MG tablet Take 1 tablet (20 mg total) by mouth daily. 04/05/20 08/03/20 Yes Lama, Gagan S, MD  gabapentin (NEURONTIN) 600 MG tablet Take 1,200 mg by mouth at bedtime.   Yes [provider]  glipiZIDE (GLUCOTROL) 10 MG tablet Take 1 tablet (10 mg total) by mouth daily before breakfast. Patient taking differently: Take 10 mg by mouth daily. 08/24/19  Yes Hammond, Elizabeth W, PA-C  HYDROmorphone (DILAUDID) 2 MG tablet Take 1 tablet (2 mg total) by mouth every 4 (four) hours as needed for severe pain. 04/11/20  Yes Goldston, Scott, MD  insulin aspart (NOVOLOG FLEXPEN) 100   UNIT/ML FlexPen Inject 5 Units into the skin 3 (three) times daily with meals. 04/05/20  Yes Oswald Hillock, MD  insulin glargine (LANTUS) 100 UNIT/ML Solostar Pen Inject 40 Units into the skin daily. Patient taking differently: Inject 20 Units into the skin 2 (two) times daily. 08/24/19  Yes [name], PA-C  melatonin 3 MG TABS  tablet Take 12 mg by mouth at bedtime.   Yes [provider]  polyethylene glycol (MIRALAX / GLYCOLAX) 17 g packet Take 17 g by mouth daily as needed for moderate constipation. 03/30/20  Yes Arrien, Jimmy Picket, MD  tadalafil (CIALIS) 10 MG tablet Take 20 mg by mouth daily as needed for erectile dysfunction.   Yes [provider]  aspirin EC 81 MG EC tablet Take 1 tablet (81 mg total) by mouth daily. Swallow whole. 03/31/20 04/30/20  Arrien, Jimmy Picket, MD  blood glucose meter kit and supplies KIT Dispense based on patient and insurance preference. Use up to four times daily as directed. (FOR ICD-9 250.00, 250.01). 09/13/19   Isla Pence, MD  dicyclomine (BENTYL) 20 MG tablet Take 1 tablet (20 mg total) by mouth 3 (three) times daily before meals. 04/07/20   Tedd Sias, PA  docusate sodium (COLACE) 100 MG capsule Take 1 capsule (100 mg total) by mouth 2 (two) times daily. 04/05/20   Oswald Hillock, MD  feeding supplement (ENSURE ENLIVE / ENSURE PLUS) LIQD Take 237 mLs by mouth 3 (three) times daily between meals. 03/30/20 04/29/20  Tawni Millers, MD  Insulin Pen Needle 29G X 12MM MISC Per instructions 04/08/19   [name], MD  losartan (COZAAR) 25 MG tablet Take 25 mg by mouth daily. 04/11/20   [provider]  metoprolol tartrate (LOPRESSOR) 50 MG tablet Take 1 tablet (50 mg total) by mouth 2 (two) times daily. 04/05/20   Oswald Hillock, MD  ondansetron (ZOFRAN ODT) 4 MG disintegrating tablet Take 1 tablet (4 mg total) by mouth every 8 (eight) hours as needed for nausea or vomiting. 04/11/20   Sherwood Gambler, MD  sucralfate (CARAFATE) 1 g tablet Take 1 tablet (1 g total) by mouth 2 (two) times daily. 04/05/20 05/05/20  Oswald Hillock, MD  valACYclovir (VALTREX) 1000 MG tablet Take 1 tablet (1,000 mg total) by mouth 3 (three) times daily. 04/11/20   Sherwood Gambler, MD    Current Facility-Administered Medications  Medication Dose Route Frequency Provider  Last Rate Last Admin  . 0.9 %  sodium chloride infusion   Intravenous Continuous Smith, Rondell A, MD 100 mL/hr at 04/15/20 0600 Infusion Verify at 04/15/20 0600  . acetaminophen (TYLENOL) tablet 650 mg  650 mg Oral Q6H PRN Norval Morton, MD       Or  . acetaminophen (TYLENOL) suppository 650 mg  650 mg Rectal Q6H PRN Smith, Rondell A, MD      . albuterol (PROVENTIL) (2.5 MG/3ML) 0.083% nebulizer solution 2.5 mg  2.5 mg Nebulization Q6H PRN Smith, Rondell A, MD      . Chlorhexidine Gluconate Cloth 2 % PADS 6 each  6 each Topical Daily Opyd, Ilene Qua, MD      . haloperidol lactate (HALDOL) injection 5 mg  5 mg Intravenous Q6H PRN Opyd, Ilene Qua, MD      . hydrALAZINE (APRESOLINE) injection 10 mg  10 mg Intravenous Q4H PRN Fuller Plan A, MD   10 mg at 04/15/20 0029  . hydrALAZINE (APRESOLINE) tablet 25 mg  25 mg Oral Q8H  Norval Morton, MD   25 mg at 04/15/20 0450  . insulin aspart (novoLOG) injection 0-6 Units  0-6 Units Subcutaneous Q4H Norval Morton, MD   2 Units at 04/15/20 0450  . insulin glargine (LANTUS) injection 10 Units  10 Units Subcutaneous QHS Norval Morton, MD   10 Units at 04/14/20 2103  . pantoprazole (PROTONIX) 80 mg in sodium chloride 0.9 % 100 mL (0.8 mg/mL) infusion  8 mg/hr Intravenous Continuous Smith, Rondell A, MD 10 mL/hr at 04/15/20 0600 8 mg/hr at 04/15/20 0600  . [START ON 04/18/2020] pantoprazole (PROTONIX) injection 40 mg  40 mg Intravenous Q12H Smith, Rondell A, MD      . sodium chloride flush (NS) 0.9 % injection 3 mL  3 mL Intravenous Q12H Smith, Rondell A, MD   3 mL at 04/14/20 2207    Allergies as of 04/14/2020 - Review Complete 04/14/2020  Allergen Reaction Noted  . Trazodone  07/30/2002  . Lisinopril Swelling 04/07/2019  . Omeprazole  04/13/2009    History reviewed. No pertinent family history.  Social History   Socioeconomic History  . Marital status: Married    Spouse name: Not on file  . Number of children: Not on file  . Years of  education: Not on file  . Highest education level: Not on file  Occupational History  . Not on file  Tobacco Use  . Smoking status: Current Every Day Smoker    Packs/day: 0.50    Types: Cigarettes  . Smokeless tobacco: Never Used  Substance and Sexual Activity  . Alcohol use: Yes  . Drug use: Yes    Types: Cocaine  . Sexual activity: Not on file  Other Topics Concern  . Not on file  Social History Narrative  . Not on file   Social Determinants of Health   Financial Resource Strain: Not on file  Food Insecurity: Not on file  Transportation Needs: Not on file  Physical Activity: Not on file  Stress: Not on file  Social Connections: Not on file  Intimate Partner Violence: Not on file    Review of Systems: As per HPI  Physical Exam: Vital signs in last 24 hours: Temp:  [98 F (36.7 C)-98.4 F (36.9 C)] 98 F (36.7 C) (03/30 0437) Pulse Rate:  [90-115] 109 (03/30 0437) Resp:  [11-21] 17 (03/30 0437) BP: (149-196)/(79-129) 167/90 (03/30 0437) SpO2:  [96 %-100 %] 96 % (03/30 0437) Weight:  [56 kg] 56 kg (03/29 2034) Last BM Date: 04/14/20  General:   Alert,  Well-developed, well-nourished, pleasant and cooperative in NAD Head:  Normocephalic and atraumatic. Eyes:  Sclera clear, no icterus.   Conjunctiva pink. Ears:  Normal auditory acuity. Nose:  No deformity, discharge,  or lesions. Mouth:  No deformity or lesions.  Oropharynx pink & moist. Neck:  Supple; no masses or thyromegaly. Lungs:  Clear throughout to auscultation.   No wheezes, crackles, or rhonchi. No acute distress. Heart:  Regular rate and rhythm; no murmurs, clicks, rubs,  or gallops. Extremities:  Without clubbing or edema. Neurologic: Alert, oriented to time, place and person with no focal neurological deficits Skin:  Intact without significant lesions or rashes. Psych:  Alert and cooperative. Normal mood and affect. Abdomen:  Soft, nontender and nondistended. No masses, hepatosplenomegaly or hernias  noted. Normal bowel sounds, without guarding, and without rebound.         Lab Results: Recent Labs    04/14/20 1043 04/14/20 1800  WBC 7.2  --  HGB 12.6* 12.2*  HCT 36.9* 35.1*  PLT 179  --    BMET Recent Labs    04/14/20 1043 04/14/20 1800  NA 124* 125*  K 4.7 4.5  CL 87* 91*  CO2 26 24  GLUCOSE 403* 424*  BUN 47* 49*  CREATININE 5.07* 5.24*  CALCIUM 8.6* 8.3*   LFT Recent Labs    04/14/20 1043  PROT 6.4*  ALBUMIN 3.2*  AST 27  ALT 18  ALKPHOS 56  BILITOT 0.6   PT/INR No results for input(s): LABPROT, INR in the last 72 hours.  Studies/Results: CT ABDOMEN PELVIS WO CONTRAST  Result Date: 04/14/2020 CLINICAL DATA:  Acute abdominal pain.  New renal failure. EXAM: CT ABDOMEN AND PELVIS WITHOUT CONTRAST TECHNIQUE: Multidetector CT imaging of the abdomen and pelvis was performed following the standard protocol without IV contrast. COMPARISON:  04/02/2020 FINDINGS: Lower chest: Right coronary artery and descending thoracic aortic atherosclerotic calcification. Calcification along the mitral valve. No overt cardiomegaly. Minimal dependent subsegmental atelectasis in both lower lobes. Hepatobiliary: Unremarkable Pancreas: Unremarkable Spleen: Unremarkable Adrenals/Urinary Tract: Both adrenal glands appear normal. Five punctate calcifications suspicious for nonobstructive renal calculi in the right kidney, measuring up to 0.3 cm in diameter. Questionable punctate 1-2 mm left mid to lower renal calculi. Faint bilateral perirenal stranding appears increased from prior. Small hypodense lesion in the right mid kidney appears stable. No hydronephrosis or hydroureter. The urinary bladder appears unremarkable. Stomach/Bowel: Prominent stool throughout the colon favors constipation. No dilated small bowel identified. The appendix is indistinct. Paucity of intra-adipose tissue making separation of loops problematic. Vascular/Lymphatic: Dense aortoiliac atherosclerotic vascular  calcification noted. Reproductive: Unremarkable Other: Trace free pelvic fluid for example eccentric to the right on image 65 series 3, increased from 04/02/2020. Cannot exclude low-grade mesenteric edema given the paucity of intra-adipose tissue. Musculoskeletal: Mild bilateral foraminal stenosis at L4-5 due to disc bulge and facet arthropathy. IMPRESSION: 1. New trace free pelvic fluid in new mild bilateral perirenal stranding without hydronephrosis or hydroureter. 2. Punctate nonobstructive renal calculi on the right and possibly the left. 3. Nonvisualization of the appendix on today's exam. 4.  Prominent stool throughout the colon favors constipation. 5. Other imaging findings of potential clinical significance: Aortic Atherosclerosis (ICD10-I70.0). Coronary atherosclerosis. Mitral valve calcification. Mild bilateral foraminal impingement at L4-5. Electronically Signed   By: Walter  Liebkemann M.D.   On: 04/14/2020 14:59   CT Head Wo Contrast  Result Date: 04/14/2020 CLINICAL DATA:  Altered mental status. EXAM: CT HEAD WITHOUT CONTRAST TECHNIQUE: Contiguous axial images were obtained from the base of the skull through the vertex without intravenous contrast. COMPARISON:  March 25, 2020. FINDINGS: Brain: Mild chronic ischemic white matter disease is noted. Old left occipital infarction is noted. No mass effect or midline shift is noted. Ventricular size is within normal limits. There is no evidence of mass lesion, hemorrhage or acute infarction. Vascular: No hyperdense vessel or unexpected calcification. Skull: Normal. Negative for fracture or focal lesion. Sinuses/Orbits: No acute finding. Other: None. IMPRESSION: Mild chronic ischemic white matter disease. Old left occipital infarction. No acute intracranial abnormality seen. Electronically Signed   By: [name]  Green Jr M.D.   On: 04/14/2020 12:40    Impression: Acute kidney injury-BUN 49, creatinine 5.24, GFR 11 today, hyponatremia Polysubstance abuse-U  tox positive for cocaine and opioid Uncontrolled diabetes-blood sugar 424 today Recent stroke Nausea, vomiting with intermittent, small streaks of blood-hemoglobin 12.6 on admission and 12.2 today, FOBT positive CT of the abdomen and pelvis without contrast for abdominal   pain and renal failure showed prominent stool throughout colon favoring constipation   Plan: From GI standpoint, small amount of small streaks of fresh blood in vomitus possibly related to Mallory-Weiss tear. Patient does not have episodes of hypotension, although there is drop in his hemoglobin from prior value, it is still above 12. No plans for endoscopic intervention. Recommend conservative management with pantoprazole 40 mg every 12 hours, will add sucralfate 1 g / 10 mL suspension twice daily.  His major issue is acute renal failure, nephrology on board.  Had a detailed discussion with the patient's brother over the phone. He will benefit from an EGD and a colonoscopy, however no indication for immediate/inpatient procedures. We will plan outpatient follow-up.  Recommend trending H&H, transfusing if required. Please recall GI if needed.   LOS: 1 day   Ronnette Juniper, MD  04/15/2020, 8:26 AM

## 2020-04-15 NOTE — Progress Notes (Addendum)
PROGRESS NOTE  William Acevedo:[id number] DOB: 27-Mar-1955 DOA: 04/14/2020 PCP: Priscille Kluver, MD   LOS: 1 day   Brief narrative:  William Acevedo is a 65 y.o. male with medical history significant of hypertension, hyperlipidemia, CVA, uncontrolled diabetes mellitus type 2, renal infarct, major depressive disorder, and polysubstance abuse(cocaine and marijuana) presented to the hospital with weakness nausea and vomiting.  Patient also had abdominal and right chest wall pain.  He did have some bright red blood per rectum as well.  . He had just recently been seen in the emergency department 3 days ago with complaints of right chest wall pain and had been diagnosed with herpes zoster.    Patient has history of multifocal brain ischemia and was admitted from 3 17-3 20.  He had been started on aspirin and Pepcid.  GI had also been formally consulted during that time but recommended outpatient work-up EGD and colonoscopy as patient had never had one before.  In the ED patient was mildly tachycardic.  Hemoglobin was 12.6.  Creatinine was elevated at 5.0.  CT scan of the abdomen pelvis significant for trace free pelvic fluid and new bilateral appearing renal stranding without hydronephrosis or hydroureter and punctate nonobstructive renal calculi in the right and possibly in the left kidney and prominent stool throughout colon favoring constipation.  Patient was given 500 mL bolus of normal saline IV fluids, morphine, Haldol, and Pepcid IV.  Patient was then admitted to hospital for further evaluation and treatment.  Assessment/Plan:  Principal Problem:   ARF (acute renal failure) (HCC) Active Problems:   Polysubstance abuse (HCC)   Dehydration   Abdominal pain   Acute blood loss anemia   GI bleed   Nausea & vomiting   Acute metabolic encephalopathy   Hypertensive urgency  Acute kidney injury.  Patient had nausea, vomiting likely prerenal in etiology.  Nephrology was consulted  continue normal saline.  Monitor closely.  Latest creatinine 5.2.  We will continue to monitor closely.   Nausea, vomiting, and abdominal pain with GI bleed. Unsure etiology.  Stool guaiac positive.  GI on board.  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  Continue Protonix and Carafate. continue hydration.  Type and cross.  Transfuse as necessary.  Patient has been seen by GI.  Uncontrolled diabetes mellitus type 2.  On insulin regimen at this time.  Continue Lantus sliding scale insulin.  Latest blood glucose level of 119.  Hemoglobin A1c of 10.6 on 3/22.  Acute on chronic hyponatremia: We will continue to monitor closely.  Secondary to volume dilution.  We will get BMP today.  Hypertensive urgency  Continue hydralazine.  We will continue to monitor blood pressure.  Chronically poorly controlled hypertension  Acute metabolic encephalopathy: Much improved exam.  History of recent stroke.  Cocaine abuse.  Likely multifactorial.  MRI at this time.  Herpes zoster: of the right back and chest wall 3 days ago and started on valacyclovir.  Continue valacyclovir  History of CVA: MRI from 03/25/20 noted multifocal acute ischemia with in both cerebellar hemispheres the right occipital lobe, and both frontal lobes.  Patient had undergone a TEE that gave concern for the possibility of possible small density on the mitral valve concerning for possible vegetation and concern for small PFO.  Blood cultures during that hospitalization however were noted to be negative.  Aspirin on hold.  Continue statins  Polysubstance abuse: UDS positive for cocaine. Patient was counseled about it.  DVT prophylaxis: SCDs Start: 04/14/20 1529  Code Status: Full code  Family Communication: I spoke with the patient's brother on the phone and updated him about the clinical condition.  Status is: Inpatient  Remains inpatient appropriate because:IV treatments appropriate due to intensity of illness or inability to  take PO, Inpatient level of care appropriate due to severity of illness and Acute kidney injury, closer monitoring of hemoglobin   Dispo: The patient is from: Home              Anticipated d/c is to: Home              Patient currently is not medically stable to d/c.   Difficult to place patient No   Consultants:  GI  Nephrology  Procedures:  None  Anti-infectives:  . Valacyclovir  Anti-infectives (From admission, onward)   Start     Dose/Rate Route Frequency Ordered Stop   04/14/20 1715  valACYclovir (VALTREX) tablet 1,000 mg  Status:  Discontinued        1,000 mg Oral 3 times daily 04/14/20 1702 04/14/20 1708     Subjective: Today, patient was seen and examined at bedside.  Patient denies any chest pain, shortness of breath, fever, chills.  Denies urinary urgency, frequency, vomiting today.  Objective: Vitals:   04/15/20 0057 04/15/20 0437  BP: (!) 180/101 (!) 167/90  Pulse:  (!) 109  Resp:  17  Temp:  98 F (36.7 C)  SpO2:  96%    Intake/Output Summary (Last 24 hours) at 04/15/2020 1109 Last data filed at 04/15/2020 0600 Gross per 24 hour  Intake 2434.33 ml  Output 1550 ml  Net 884.33 ml   Filed Weights   04/14/20 0951 04/14/20 2034  Weight: 56 kg 56 kg   Body mass index is 19.93 kg/m.   Physical Exam: GENERAL: Patient is alert awake and communicative,. Not in obvious distress.  Thinly built HENT: No scleral pallor or icterus. Pupils equally reactive to light. Oral mucosa is moist NECK: is supple, no gross swelling noted. CHEST: Clear to auscultation. No crackles or wheezes.  Diminished breath sounds bilaterally. CVS: S1 and S2 heard, no murmur. Regular rate and rhythm.  ABDOMEN: Soft, non-tender, bowel sounds are present. EXTREMITIES: No edema. CNS: Cranial nerves are intact. No focal motor deficits.  Mildly emotional. SKIN: warm and dry without rashes.  Data Review: I have personally reviewed the following laboratory data and  studies,  CBC: Recent Labs  Lab 04/11/20 1757 04/14/20 1043 04/14/20 1800  WBC 6.5 7.2  --   NEUTROABS 4.0 5.9  --   HGB 15.4 12.6* 12.2*  HCT 45.8 36.9* 35.1*  MCV 85.8 83.3  --   PLT 307 179  --    Basic Metabolic Panel: Recent Labs  Lab 04/11/20 1757 04/14/20 1043 04/14/20 1800  NA 130* 124* 125*  K 3.4* 4.7 4.5  CL 90* 87* 91*  CO2 27 26 24   GLUCOSE 164* 403* 424*  BUN 24* 47* 49*  CREATININE 1.20 5.07* 5.24*  CALCIUM 9.6 8.6* 8.3*   Liver Function Tests: Recent Labs  Lab 04/11/20 1757 04/14/20 1043  AST 27 27  ALT 24 18  ALKPHOS 57 56  BILITOT 0.9 0.6  PROT 7.5 6.4*  ALBUMIN 3.8 3.2*   Recent Labs  Lab 04/11/20 1757 04/14/20 1043  LIPASE 19 41   No results for input(s): AMMONIA in the last 168 hours. Cardiac Enzymes: Recent Labs  Lab 04/14/20 1600  CKTOTAL 171   BNP (last 3 results) No results for input(s):  BNP in the last 8760 hours.  ProBNP (last 3 results) No results for input(s): PROBNP in the last 8760 hours.  CBG: Recent Labs  Lab 04/14/20 2034 04/15/20 0024 04/15/20 0050 04/15/20 0438 04/15/20 0750  GLUCAP 283* 62* 133* 216* 119*   Recent Results (from the past 240 hour(s))  Resp Panel by RT-PCR (Flu A&B, Covid) Nasopharyngeal Swab     Status: None   Collection Time: 04/14/20  3:12 PM   Specimen: Nasopharyngeal Swab; Nasopharyngeal(NP) swabs in vial transport medium  Result Value Ref Range Status   SARS Coronavirus 2 by RT PCR NEGATIVE NEGATIVE Final    Comment: (NOTE) SARS-CoV-2 target nucleic acids are NOT DETECTED.  The SARS-CoV-2 RNA is generally detectable in upper respiratory specimens during the acute phase of infection. The lowest concentration of SARS-CoV-2 viral copies this assay can detect is 138 copies/mL. A negative result does not preclude SARS-Cov-2 infection and should not be used as the sole basis for treatment or other patient management decisions. A negative result may occur with  improper specimen  collection/handling, submission of specimen other than nasopharyngeal swab, presence of viral mutation(s) within the areas targeted by this assay, and inadequate number of viral copies(<138 copies/mL). A negative result must be combined with clinical observations, patient history, and epidemiological information. The expected result is Negative.  Fact Sheet for Patients:  BloggerCourse.comhttps://www.fda.gov/media/152166/download  Fact Sheet for Healthcare Providers:  SeriousBroker.ithttps://www.fda.gov/media/152162/download  This test is no t yet approved or cleared by the [place] FDA and  has been authorized for detection and/or diagnosis of SARS-CoV-2 by FDA under an Emergency Use Authorization (EUA). This EUA will remain  in effect (meaning this test can be used) for the duration of the COVID-19 declaration under Section 564(b)(1) of the Act, 21 U.S.C.section 360bbb-3(b)(1), unless the authorization is terminated  or revoked sooner.       Influenza A by PCR NEGATIVE NEGATIVE Final   Influenza B by PCR NEGATIVE NEGATIVE Final    Comment: (NOTE) The Xpert Xpress SARS-CoV-2/FLU/RSV plus assay is intended as an aid in the diagnosis of influenza from Nasopharyngeal swab specimens and should not be used as a sole basis for treatment. Nasal washings and aspirates are unacceptable for Xpert Xpress SARS-CoV-2/FLU/RSV testing.  Fact Sheet for Patients: BloggerCourse.comhttps://www.fda.gov/media/152166/download  Fact Sheet for Healthcare Providers: SeriousBroker.ithttps://www.fda.gov/media/152162/download  This test is not yet approved or cleared by the [place] FDA and has been authorized for detection and/or diagnosis of SARS-CoV-2 by FDA under an Emergency Use Authorization (EUA). This EUA will remain in effect (meaning this test can be used) for the duration of the COVID-19 declaration under Section 564(b)(1) of the Act, 21 U.S.C. section 360bbb-3(b)(1), unless the authorization is terminated or revoked.  Performed at [facility], [address] [place], [place] [address]      Studies: CT ABDOMEN PELVIS WO CONTRAST  Result Date: 04/14/2020 CLINICAL DATA:  Acute abdominal pain.  New renal failure. EXAM: CT ABDOMEN AND PELVIS WITHOUT CONTRAST TECHNIQUE: Multidetector CT imaging of the abdomen and pelvis was performed following the standard protocol without IV contrast. COMPARISON:  04/02/2020 FINDINGS: Lower chest: Right coronary artery and descending thoracic aortic atherosclerotic calcification. Calcification along the mitral valve. No overt cardiomegaly. Minimal dependent subsegmental atelectasis in both lower lobes. Hepatobiliary: Unremarkable Pancreas: Unremarkable Spleen: Unremarkable Adrenals/Urinary Tract: Both adrenal glands appear normal. Five punctate calcifications suspicious for nonobstructive renal calculi in the right kidney, measuring up to 0.3 cm in diameter. Questionable punctate 1-2 mm left mid to lower  renal calculi. Faint bilateral perirenal stranding appears increased from prior. Small hypodense lesion in the right mid kidney appears stable. No hydronephrosis or hydroureter. The urinary bladder appears unremarkable. Stomach/Bowel: Prominent stool throughout the colon favors constipation. No dilated small bowel identified. The appendix is indistinct. Paucity of intra-adipose tissue making separation of loops problematic. Vascular/Lymphatic: Dense aortoiliac atherosclerotic vascular calcification noted. Reproductive: Unremarkable Other: Trace free pelvic fluid for example eccentric to the right on image 65 series 3, increased from 04/02/2020. Cannot exclude low-grade mesenteric edema given the paucity of intra-adipose tissue. Musculoskeletal: Mild bilateral foraminal stenosis at L4-5 due to disc bulge and facet arthropathy. IMPRESSION: 1. New trace free pelvic fluid in new mild bilateral perirenal stranding without hydronephrosis or hydroureter. 2. Punctate nonobstructive renal calculi on the right and  possibly the left. 3. Nonvisualization of the appendix on today's exam. 4.  Prominent stool throughout the colon favors constipation. 5. Other imaging findings of potential clinical significance: Aortic Atherosclerosis (ICD10-I70.0). Coronary atherosclerosis. Mitral valve calcification. Mild bilateral foraminal impingement at L4-5. Electronically Signed   By: Gaylyn Rong M.D.   On: 04/14/2020 14:59   CT Head Wo Contrast  Result Date: 04/14/2020 CLINICAL DATA:  Altered mental status. EXAM: CT HEAD WITHOUT CONTRAST TECHNIQUE: Contiguous axial images were obtained from the base of the skull through the vertex without intravenous contrast. COMPARISON:  March 25, 2020. FINDINGS: Brain: Mild chronic ischemic white matter disease is noted. Old left occipital infarction is noted. No mass effect or midline shift is noted. Ventricular size is within normal limits. There is no evidence of mass lesion, hemorrhage or acute infarction. Vascular: No hyperdense vessel or unexpected calcification. Skull: Normal. Negative for fracture or focal lesion. Sinuses/Orbits: No acute finding. Other: None. IMPRESSION: Mild chronic ischemic white matter disease. Old left occipital infarction. No acute intracranial abnormality seen. Electronically Signed   By: Lupita Raider M.D.   On: 04/14/2020 12:40      Joycelyn Das, MD  [facility] 04/15/2020  If 7PM-7AM, please contact night-coverage

## 2020-04-15 NOTE — Progress Notes (Signed)
Initial Nutrition Assessment  DOCUMENTATION CODES:  Severe malnutrition in context of social or environmental circumstances  INTERVENTION:  Recommend liberalizing diet by removing renal restriction, but continuing carb mod restriction - spoke with MD.  Add Ensure Enlive po TID, each supplement provides 350 kcal and 20 grams of protein.  Add Magic cup TID with meals, each supplement provides 290 kcal and 9 grams of protein.  Add MVI with minerals daily.  NUTRITION DIAGNOSIS:  Severe Malnutrition related to social / environmental circumstances (Homelessness) as evidenced by severe fat depletion,severe muscle depletion.  GOAL:  Patient will meet greater than or equal to 90% of their needs  MONITOR:  PO intake,Supplement acceptance,Labs,Weight trends,Diet advancement  REASON FOR ASSESSMENT:  Malnutrition Screening Tool    ASSESSMENT:  65 yo male with a PMH of HTN, HLD, CVA, uncontrolled T2DM, renal infarct, major depressive disorder, and polysubstance abuse (cocaine and marijuana) who presents with acute renal failure and N/V with small streaks of blood in vomit. Pt UDS tested positive for cocaine and opioids. Pt recently d/c'd [facility] and [facility] with NSTEMI and uncontrolled T2DM then abdominal pain, respectively. 3/30 - GI considers potential Mallory-Weiss tear to explain small streaks of flesh blood in vomit  Spoke with pt at bedside. Pt was agitated and wanting a meal. He did not give history, but per MD note, pt not a good historian, and it is noted that pt is homeless with limited access to food. All history was obtained from his brother in MD note.  When asked if he lost weight, pt showed RD his leg. On exam, pt is noted to be severely malnourished. This is noted in other RD notes from previous admissions also.  Recommend liberalizing diet to remove renal restriction, as pt has decent labs and a renal diet is not shown to be beneficial in the acute failure setting. Also recommend  adding Ensure TID and Magic Cup TID, as well as an MVI with minerals, for repletion and caloric/protein intake. If PO intake decreases, consider removing carb mod restriction in setting of severe malnutrition.  Relevant Medications: SSI, lantus, Protonix, sucralfate Labs: reviewed; CBG 62-424, Na 125 HbA1c: 10.7% (03/2020)  NUTRITION - FOCUSED PHYSICAL EXAM: Flowsheet Row Most Recent Value  Orbital Region Severe depletion  Upper Arm Region Severe depletion  Thoracic and Lumbar Region Severe depletion  Buccal Region Severe depletion  Temple Region Severe depletion  Clavicle Bone Region Severe depletion  Clavicle and Acromion Bone Region Severe depletion  Scapular Bone Region Severe depletion  Dorsal Hand Severe depletion  Patellar Region Severe depletion  Anterior Thigh Region Severe depletion  Posterior Calf Region Severe depletion  Edema (RD Assessment) None  Hair Reviewed  Eyes Reviewed  Mouth Reviewed  Skin Reviewed  Nails Reviewed     Diet Order:   Diet Order            Diet renal/carb modified with fluid restriction Diet-HS Snack? Nothing; Fluid restriction: 1200 mL Fluid; Room service appropriate? Yes; Fluid consistency: Thin  Diet effective now                EDUCATION NEEDS:  Education needs have been addressed  Skin:  Skin Assessment: Reviewed RN Assessment  Last BM:  04/14/20  Height:  Ht Readings from Last 1 Encounters:  04/14/20 5\' 6"  (1.676 m)   Weight:  Wt Readings from Last 1 Encounters:  04/14/20 56 kg   Ideal Body Weight:  64.5 kg  BMI:  Body mass index is 19.93 kg/m.  Estimated Nutritional Needs:  Kcal:  1900-2100 Protein:  95-105 grams Fluid:  >1.9 L  Vertell Limber, RD, LDN Registered Dietitian After Hours/Weekend Pager # in [place]

## 2020-04-15 NOTE — Progress Notes (Signed)
Notified MD BP 190/102, gave prn 10mg  hydralazine, bp now 180/101. Pt. neurologically at baseline no change, pt irritable that RN has to assess pt frequently.Pt. stated and I quote "I did drugs the other day, my pressure always high". Rn educated of harmful effects of drugs, pt disinterested in what RN had to offer.   Additionally, pt cbg 62-, RN adminstered 2 apple juices with 1 packet of sugar. cbg 133. MD notified and aware, RN will closely monitor pt.

## 2020-04-16 LAB — BASIC METABOLIC PANEL
Anion gap: 9 (ref 5–15)
BUN: 54 mg/dL — ABNORMAL HIGH (ref 8–23)
CO2: 23 mmol/L (ref 22–32)
Calcium: 8.2 mg/dL — ABNORMAL LOW (ref 8.9–10.3)
Chloride: 101 mmol/L (ref 98–111)
Creatinine, Ser: 5.01 mg/dL — ABNORMAL HIGH (ref 0.61–1.24)
GFR, Estimated: 12 mL/min — ABNORMAL LOW (ref >60)
Glucose, Bld: 133 mg/dL — ABNORMAL HIGH (ref 70–99)
Potassium: 4 mmol/L (ref 3.5–5.1)
Sodium: 133 mmol/L — ABNORMAL LOW (ref 135–145)

## 2020-04-16 LAB — CBC
HCT: 33.4 % — ABNORMAL LOW (ref 39.0–52.0)
Hemoglobin: 11.3 g/dL — ABNORMAL LOW (ref 13.0–17.0)
MCH: 28.1 pg (ref 26.0–34.0)
MCHC: 33.8 g/dL (ref 30.0–36.0)
MCV: 83.1 fL (ref 80.0–100.0)
Platelets: 171 10*3/uL (ref 150–400)
RBC: 4.02 MIL/uL — ABNORMAL LOW (ref 4.22–5.81)
RDW: 12 % (ref 11.5–15.5)
WBC: 7.4 10*3/uL (ref 4.0–10.5)
nRBC: 0 % (ref 0.0–0.2)

## 2020-04-16 LAB — GLUCOSE, CAPILLARY
Glucose-Capillary: 134 mg/dL — ABNORMAL HIGH (ref 70–99)
Glucose-Capillary: 185 mg/dL — ABNORMAL HIGH (ref 70–99)
Glucose-Capillary: 201 mg/dL — ABNORMAL HIGH (ref 70–99)
Glucose-Capillary: 204 mg/dL — ABNORMAL HIGH (ref 70–99)
Glucose-Capillary: 218 mg/dL — ABNORMAL HIGH (ref 70–99)
Glucose-Capillary: 292 mg/dL — ABNORMAL HIGH (ref 70–99)
Glucose-Capillary: 479 mg/dL — ABNORMAL HIGH (ref 70–99)

## 2020-04-16 LAB — MAGNESIUM: Magnesium: 2.1 mg/dL (ref 1.7–2.4)

## 2020-04-16 LAB — PHOSPHORUS: Phosphorus: 3.8 mg/dL (ref 2.5–4.6)

## 2020-04-16 MED ORDER — AMITRIPTYLINE HCL 10 MG PO TABS
20.0000 mg | ORAL_TABLET | Freq: Every day | ORAL | Status: DC
  Administered 2020-04-16 – 2020-04-18 (×3): 20 mg via ORAL
  Filled 2020-04-16 (×4): qty 2

## 2020-04-16 MED ORDER — INSULIN GLARGINE 100 UNIT/ML SOLOSTAR PEN: 20.0000 [IU] | PEN_INJECTOR | Freq: Two times a day (BID) | SUBCUTANEOUS | Status: DC

## 2020-04-16 MED ORDER — INSULIN ASPART 100 UNIT/ML ~~LOC~~ SOLN: 0.0000 [IU] | Freq: Three times a day (TID) | SUBCUTANEOUS | Status: DC

## 2020-04-16 MED ORDER — GABAPENTIN 600 MG PO TABS
300.0000 mg | ORAL_TABLET | Freq: Every day | ORAL | Status: DC
  Administered 2020-04-16 – 2020-04-18 (×3): 300 mg via ORAL
  Filled 2020-04-16 (×3): qty 1

## 2020-04-16 MED ORDER — INSULIN ASPART 100 UNIT/ML ~~LOC~~ SOLN
5.0000 [IU] | Freq: Three times a day (TID) | SUBCUTANEOUS | Status: DC
  Administered 2020-04-16 – 2020-04-19 (×9): 5 [IU] via SUBCUTANEOUS

## 2020-04-16 MED ORDER — DOCUSATE SODIUM 100 MG PO CAPS
100.0000 mg | ORAL_CAPSULE | Freq: Two times a day (BID) | ORAL | Status: DC
  Administered 2020-04-16 – 2020-04-19 (×7): 100 mg via ORAL
  Filled 2020-04-16 (×7): qty 1

## 2020-04-16 MED ORDER — POLYETHYLENE GLYCOL 3350 17 G PO PACK
17.0000 g | PACK | Freq: Every day | ORAL | Status: DC | PRN
  Administered 2020-04-19: 17 g via ORAL
  Filled 2020-04-16: qty 1

## 2020-04-16 MED ORDER — INSULIN GLARGINE 100 UNIT/ML ~~LOC~~ SOLN
12.0000 [IU] | Freq: Every day | SUBCUTANEOUS | Status: DC
  Filled 2020-04-16: qty 0.12

## 2020-04-16 MED ORDER — ATORVASTATIN CALCIUM 10 MG PO TABS
20.0000 mg | ORAL_TABLET | Freq: Every day | ORAL | Status: DC
  Administered 2020-04-16 – 2020-04-19 (×4): 20 mg via ORAL
  Filled 2020-04-16 (×4): qty 2

## 2020-04-16 MED ORDER — INSULIN ASPART 100 UNIT/ML ~~LOC~~ SOLN
0.0000 [IU] | Freq: Three times a day (TID) | SUBCUTANEOUS | Status: DC
  Administered 2020-04-16 – 2020-04-17 (×2): 3 [IU] via SUBCUTANEOUS
  Administered 2020-04-17: 2 [IU] via SUBCUTANEOUS
  Administered 2020-04-17 – 2020-04-18 (×3): 3 [IU] via SUBCUTANEOUS
  Administered 2020-04-19: 2 [IU] via SUBCUTANEOUS
  Administered 2020-04-19: 9 [IU] via SUBCUTANEOUS

## 2020-04-16 MED ORDER — INSULIN ASPART 100 UNIT/ML ~~LOC~~ SOLN
20.0000 [IU] | Freq: Once | SUBCUTANEOUS | Status: AC
  Administered 2020-04-16: 20 [IU] via SUBCUTANEOUS

## 2020-04-16 MED ORDER — GABAPENTIN 600 MG PO TABS: 600.0000 mg | ORAL_TABLET | Freq: Every day | ORAL | Status: DC

## 2020-04-16 MED ORDER — FAMOTIDINE 20 MG PO TABS
20.0000 mg | ORAL_TABLET | Freq: Every day | ORAL | Status: DC
  Administered 2020-04-16 – 2020-04-19 (×4): 20 mg via ORAL
  Filled 2020-04-16 (×4): qty 1

## 2020-04-16 MED ORDER — CALAMINE EX LOTN
TOPICAL_LOTION | CUTANEOUS | Status: DC | PRN
  Filled 2020-04-16: qty 177

## 2020-04-16 MED ORDER — CYCLOBENZAPRINE HCL 5 MG PO TABS: 5.0000 mg | ORAL_TABLET | Freq: Three times a day (TID) | ORAL | Status: DC | PRN

## 2020-04-16 MED ORDER — INSULIN GLARGINE 100 UNIT/ML ~~LOC~~ SOLN
20.0000 [IU] | Freq: Every day | SUBCUTANEOUS | Status: DC
  Administered 2020-04-16 – 2020-04-18 (×3): 20 [IU] via SUBCUTANEOUS
  Filled 2020-04-16 (×4): qty 0.2

## 2020-04-16 MED ORDER — ONDANSETRON 4 MG PO TBDP: 4.0000 mg | ORAL_TABLET | Freq: Three times a day (TID) | ORAL | Status: DC | PRN

## 2020-04-16 MED ORDER — INSULIN ASPART 100 UNIT/ML FLEXPEN: 5.0000 [IU] | PEN_INJECTOR | Freq: Three times a day (TID) | SUBCUTANEOUS | Status: DC

## 2020-04-16 MED ORDER — MELATONIN 3 MG PO TABS
12.0000 mg | ORAL_TABLET | Freq: Every day | ORAL | Status: DC
  Administered 2020-04-16 – 2020-04-18 (×3): 12 mg via ORAL
  Filled 2020-04-16 (×3): qty 4

## 2020-04-16 MED ORDER — AMLODIPINE BESYLATE 10 MG PO TABS
10.0000 mg | ORAL_TABLET | Freq: Every day | ORAL | Status: DC
  Administered 2020-04-16 – 2020-04-19 (×4): 10 mg via ORAL
  Filled 2020-04-16 (×4): qty 1

## 2020-04-16 MED ORDER — ACETAMINOPHEN 325 MG PO TABS: 650.0000 mg | ORAL_TABLET | Freq: Four times a day (QID) | ORAL | Status: DC | PRN

## 2020-04-16 MED ORDER — DICYCLOMINE HCL 20 MG PO TABS
20.0000 mg | ORAL_TABLET | Freq: Three times a day (TID) | ORAL | Status: DC
Start: 2020-04-16 — End: 2020-04-19
  Administered 2020-04-16 – 2020-04-19 (×11): 20 mg via ORAL
  Filled 2020-04-16 (×11): qty 1

## 2020-04-16 MED ORDER — INSULIN ASPART 100 UNIT/ML ~~LOC~~ SOLN: 0.0000 [IU] | Freq: Every day | SUBCUTANEOUS | Status: DC

## 2020-04-16 NOTE — Progress Notes (Signed)
Inpatient Diabetes Program Recommendations  AACE/ADA: New Consensus Statement on Inpatient Glycemic Control (2015)  Target Ranges:  Prepandial:   less than 140 mg/dL      Peak postprandial:   less than 180 mg/dL (1-2 hours)      Critically ill patients:  140 - 180 mg/dL   Lab Results  Component Value Date   GLUCAP 185 (H) 04/16/2020   HGBA1C 10.7 (H) 04/14/2020   Review of Glycemic Control  Diabetes history: DM2 Outpatient Diabetes medications: Jardiance 25 mg daily, Glipizide 10 daily, Lantus 40 units daily, Novolog 5 units TID with meals Current orders for Inpatient glycemic control:  Lantus 10 units qhs Novolog 0-6 units Q4 hours  Ensure enlive tid between meals  Inpatient Diabetes Program Recommendations:    -  Increase Lantus to 12 units   Thank you. Christena Deem RN, MSN, BC-ADM Inpatient Diabetes Coordinator Team Pager [phone] (8a-5p)

## 2020-04-16 NOTE — TOC Initial Note (Signed)
Transition of Care [facility]) - Initial/Assessment Note    Patient Details  Name: William Acevedo MRN: [id number] Date of Birth: 05-20-1955  Transition of Care [facility]) CM/SW Contact:    Cristobal Goldmann, LCSW Phone Number: 04/16/2020, 4:16 PM  Clinical Narrative:  CSW visited room and talked briefly with patient regarding talking with him and brother - Ronell Duffus ([phone]), who was in the room regarding his discharge plan. Brother requested to talk with CSW privately and patient gave permission for CSW to talk with privately.          Patient's brother explained that his brother Joron has lived with him for 7 years and talked about his drug addiction, and being on drugs for 47 years. He explained that his brother can't care for himself and is not physically or mentally able to be alone and is not safe at home post discharge. He explained that his brother has been to the hospital 7 times this month and has not been eating in 27 days until coming to [facility]. Mr Trettin shared about taking his brother to the [facility] recently and not being admitted. The brother added that hat he has been told that his brother has brain damage and kidney dysfunction.   He talked about his brother's drug use and indicated that his drug of choice is cocaine. He talked about his brother's change in mentation and indicated that he was disoriented yesterday and this morning.   The brother talked about patient's son and reported that he has no interest in his father's health. CSW advised that the patient has been to prison and has been out for 7 years.   Mr. Reels is interested in his brother going to a skilled nursing facility for ST rehab and when asked, responded that he gets his care from [place]. [facility] explained [facility] and getting [facility].  CSW will continue to follow and provide SW intervention services as needed through discharge.   Expected Discharge  Plan: Skilled Nursing Facility Barriers to Discharge: Continued Medical Work up   Patient Goals and CMS Choice Patient states their goals for this hospitalization and ongoing recovery are:: Brother reported that he cannot care for patient at home post discharge from hospital CMS Medicare.gov Compare Post Acute Care list provided to:: Other (Comment Required) (Patient/brother not provided with SNF list at this time as if the [place] approves placement, CSW will receive contracted [facility] list) Choice offered to / list presented to : Patient,Sibling (Brother advised regarding CSW getting list of contracted facilities from the [place])  Expected Discharge Plan and Services Expected Discharge Plan: Skilled Nursing Facility In-house Referral: Clinical Social Work     Living arrangements for the past 2 months: Single Family Home                                      Prior Living Arrangements/Services Living arrangements for the past 2 months: Single Family Home Lives with:: Siblings (Brother Mohammedali Bedoy) Patient language and need for interpreter reviewed:: No Do you feel safe going back to the place where you live?: No   Patient's brother does not feel that he can appropriately care for patient at this time  Need for Family Participation in Patient Care: Yes (Comment) Care giver support system in place?: No (comment)   Criminal Activity/Legal Involvement Pertinent to Current Situation/Hospitalization: No - Comment as needed  Activities of Daily Living      Permission Sought/Granted Permission sought to share information with : Family Supports Permission granted to share information with : Yes, Verbal Permission Granted  Share Information with NAME: Malvin Morrish     Permission granted to share info w Relationship: Brother  Permission granted to share info w Contact Information: [phone]  Emotional Assessment Appearance:: Appears stated age Attitude/Demeanor/Rapport:  Engaged Affect (typically observed): Appropriate Orientation: : Oriented to Self,Oriented to Place,Oriented to  Time,Oriented to Situation Alcohol / Substance Use: Tobacco Use,Alcohol Use,Illicit Drugs (Per H&P and conversation with brother, patient cureently has current alcohol and drug use (cocaine). Patient also smokes cigarettes.) Psych Involvement: No (comment)  Admission diagnosis:  ARF (acute renal failure) (HCC) [N17.9] Acute renal failure, unspecified acute renal failure type [facility]) [N17.9] Patient Active Problem List   Diagnosis Date Noted  . ARF (acute renal failure) (HCC) 04/14/2020  . Acute blood loss anemia 04/14/2020  . GI bleed 04/14/2020  . Nausea & vomiting 04/14/2020  . Acute metabolic encephalopathy 04/14/2020  . Hypertensive urgency 04/14/2020  . Abdominal pain 04/02/2020  . Protein-calorie malnutrition, severe 03/27/2020  . Cerebral thrombosis with cerebral infarction 03/26/2020  . NSTEMI (non-ST elevated myocardial infarction) (HCC) 03/25/2020  . Secondary diabetes mellitus with HHNC (hyperglycemia hyperosmolar non-ketotic coma) (HCC) 03/25/2020  . Elevated troponin   . Renal infarct (HCC)   . Cerebrovascular accident (CVA) due to embolism of cerebral artery (HCC)   . Cocaine use disorder, severe, dependence (HCC) 04/08/2019  . Major depressive disorder 04/08/2019  . Epiglottitis 04/07/2019  . Cocaine use 04/07/2019  . Acute encephalopathy 04/07/2019  . Overdose 07/06/2015  . AKI (acute kidney injury) (HCC) 07/06/2015  . Polysubstance abuse (HCC) 07/06/2015  . DM2 (diabetes mellitus, type 2) (HCC) 07/06/2015  . Dehydration   . MDD (major depressive disorder), recurrent episode, mild (HCC)   . Uncomplicated alcohol dependence (HCC)    PCP:  Priscille Kluver, MD Pharmacy:   [facility] [phone] [place], [place] - [address] [place] [address] Phone: [phone] Fax: [phone]  [id number] [place], [place] - [address] AT [facility] OF [address] & [address] [facility] [address] Phone: [phone] Fax: [phone]     Social Determinants of Health (SDOH) Interventions  None requested or needed at this time.  Readmission Risk Interventions No flowsheet data found.

## 2020-04-16 NOTE — Progress Notes (Signed)
   04/16/20 1158  Clinical Encounter Type  Visited With Patient;Family  Visit Type Initial  Referral From Nurse  Consult/Referral To Chaplain  Spiritual Encounters  Spiritual Needs Literature  Stress Factors  Patient Stress Factors Major life changes  Family Stress Factors Major life changes (Mr. Cauthorn brother would like to look into Skilled Facility.)  Chaplain responded to a call from [place] the [employer]. that Mr. Moodie and his Brother Mr. Fayrene Fearing wanted to create a HCPOA.  I had an enjoyable conversation with Mr. Daine and Mr. Fayrene Fearing.  They were going to discuss primary and secondary and fill out the AD. I had to attend class but made plans with Mr. Fayrene Fearing to meet him in Mr. Bowen room tomorrow morning to finalize and notarize the AD.  Mr. Fayrene Fearing was also able to speak with the Child psychotherapist.  Chaplain Ilia Dimaano Morgan-Simpson [phone]

## 2020-04-16 NOTE — Progress Notes (Signed)
Bladder scan done- Zero residual,patient has indwelling foley catheter.

## 2020-04-16 NOTE — Progress Notes (Signed)
[facility] Progress Note   Subjective:  Sleeping, awakens easily.  Calm.  No complaints.  Hungry.   Objective Vitals:   04/15/20 1027 04/15/20 1700 04/15/20 2108 04/16/20 0516  BP: (!) 165/80 (!) 165/80 (!) 173/93 (!) 174/91  Pulse: (!) 105 (!) 103 98 94  Resp: 17 17 18 18   Temp: 98.1 F (36.7 C) 98.2 F (36.8 C) 98.4 F (36.9 C) 98.1 F (36.7 C)  TempSrc: Oral Oral Oral Oral  SpO2: 98% 98% 100% 99%  Weight:      Height:       Physical Exam General: calm and easily awakens Heart: RRR Lungs: normal WOB on RA Extremities: no edema GU: foley with clear yellow urine in bag  Additional Objective Labs: Basic Metabolic Panel: Recent Labs  Lab 04/14/20 1800 04/15/20 1103 04/16/20 0354  NA 125* 128* 133*  K 4.5 4.6 4.0  CL 91* 95* 101  CO2 24 22 23   GLUCOSE 424* 328* 133*  BUN 49* 49* 54*  CREATININE 5.24* 5.38* 5.01*  CALCIUM 8.3* 8.4* 8.2*  PHOS  --   --  3.8   Liver Function Tests: Recent Labs  Lab 04/11/20 1757 04/14/20 1043  AST 27 27  ALT 24 18  ALKPHOS 57 56  BILITOT 0.9 0.6  PROT 7.5 6.4*  ALBUMIN 3.8 3.2*   Recent Labs  Lab 04/11/20 1757 04/14/20 1043  LIPASE 19 41   CBC: Recent Labs  Lab 04/11/20 1757 04/14/20 1043 04/14/20 1800 04/15/20 1103 04/16/20 0354  WBC 6.5 7.2  --  8.1 7.4  NEUTROABS 4.0 5.9  --   --   --   HGB 15.4 12.6* 12.2* 12.7* 11.3*  HCT 45.8 36.9* 35.1* 36.9* 33.4*  MCV 85.8 83.3  --  83.9 83.1  PLT 307 179  --  183 171   Blood Culture    Component Value Date/Time   SDES BLOOD RIGHT ARM 03/26/2020 1805   SPECREQUEST  03/26/2020 1805    BOTTLES DRAWN AEROBIC ONLY Blood Culture adequate volume   CULT  03/26/2020 1805    NO GROWTH 5 DAYS Performed at [facility], [address], [place], [address] [place]    REPTSTATUS 03/31/2020 FINAL 03/26/2020 1805    Cardiac Enzymes: Recent Labs  Lab 04/14/20 1600  CKTOTAL 171   CBG: Recent Labs  Lab 04/15/20 1656 04/15/20 2123  04/16/20 0017 04/16/20 0402 04/16/20 0729  GLUCAP 205* 200* 218* 134* 185*   Iron Studies: No results for input(s): IRON, TIBC, TRANSFERRIN, FERRITIN in the last 72 hours. @lablastinr3 @ Studies/Results: CT ABDOMEN PELVIS WO CONTRAST  Result Date: 04/14/2020 CLINICAL DATA:  Acute abdominal pain.  New renal failure. EXAM: CT ABDOMEN AND PELVIS WITHOUT CONTRAST TECHNIQUE: Multidetector CT imaging of the abdomen and pelvis was performed following the standard protocol without IV contrast. COMPARISON:  04/02/2020 FINDINGS: Lower chest: Right coronary artery and descending thoracic aortic atherosclerotic calcification. Calcification along the mitral valve. No overt cardiomegaly. Minimal dependent subsegmental atelectasis in both lower lobes. Hepatobiliary: Unremarkable Pancreas: Unremarkable Spleen: Unremarkable Adrenals/Urinary Tract: Both adrenal glands appear normal. Five punctate calcifications suspicious for nonobstructive renal calculi in the right kidney, measuring up to 0.3 cm in diameter. Questionable punctate 1-2 mm left mid to lower renal calculi. Faint bilateral perirenal stranding appears increased from prior. Small hypodense lesion in the right mid kidney appears stable. No hydronephrosis or hydroureter. The urinary bladder appears unremarkable. Stomach/Bowel: Prominent stool throughout the colon favors constipation. No dilated small bowel identified. The appendix  is indistinct. Paucity of intra-adipose tissue making separation of loops problematic. Vascular/Lymphatic: Dense aortoiliac atherosclerotic vascular calcification noted. Reproductive: Unremarkable Other: Trace free pelvic fluid for example eccentric to the right on image 65 series 3, increased from 04/02/2020. Cannot exclude low-grade mesenteric edema given the paucity of intra-adipose tissue. Musculoskeletal: Mild bilateral foraminal stenosis at L4-5 due to disc bulge and facet arthropathy. IMPRESSION: 1. New trace free pelvic fluid in  new mild bilateral perirenal stranding without hydronephrosis or hydroureter. 2. Punctate nonobstructive renal calculi on the right and possibly the left. 3. Nonvisualization of the appendix on today's exam. 4.  Prominent stool throughout the colon favors constipation. 5. Other imaging findings of potential clinical significance: Aortic Atherosclerosis (ICD10-I70.0). Coronary atherosclerosis. Mitral valve calcification. Mild bilateral foraminal impingement at L4-5. Electronically Signed   By: Gaylyn Rong M.D.   On: 04/14/2020 14:59   CT Head Wo Contrast  Result Date: 04/14/2020 CLINICAL DATA:  Altered mental status. EXAM: CT HEAD WITHOUT CONTRAST TECHNIQUE: Contiguous axial images were obtained from the base of the skull through the vertex without intravenous contrast. COMPARISON:  March 25, 2020. FINDINGS: Brain: Mild chronic ischemic white matter disease is noted. Old left occipital infarction is noted. No mass effect or midline shift is noted. Ventricular size is within normal limits. There is no evidence of mass lesion, hemorrhage or acute infarction. Vascular: No hyperdense vessel or unexpected calcification. Skull: Normal. Negative for fracture or focal lesion. Sinuses/Orbits: No acute finding. Other: None. IMPRESSION: Mild chronic ischemic white matter disease. Old left occipital infarction. No acute intracranial abnormality seen. Electronically Signed   By: Lupita Raider M.D.   On: 04/14/2020 12:40   Medications: . sodium chloride 100 mL/hr at 04/15/20 2000   . amitriptyline  20 mg Oral QHS  . amLODipine  10 mg Oral Daily  . atorvastatin  20 mg Oral Daily  . Chlorhexidine Gluconate Cloth  6 each Topical Daily  . dicyclomine  20 mg Oral TID AC  . docusate sodium  100 mg Oral BID  . famotidine  20 mg Oral Daily  . feeding supplement  237 mL Oral TID BM  . gabapentin  600 mg Oral QHS  . hydrALAZINE  25 mg Oral Q8H  . insulin aspart  0-6 Units Subcutaneous Q4H  . insulin glargine  10  Units Subcutaneous QHS  . melatonin  12 mg Oral QHS  . multivitamin with minerals  1 tablet Oral Daily  . pantoprazole  40 mg Intravenous Q12H  . sodium chloride flush  3 mL Intravenous Q12H  . sucralfate  1 g Oral BID    Assessment/Plan **AKI, severe: Cr was 1.2 just 3 days prior up to ~5 at admission. Multifactorial with  prerenal with poor po intake, HTN urgency with cocaine, valtrex toxicity.  CT without obstruction.  Improving slightly with hydration, holding valtrex.  Dec IVF to 75/hr today but can likely d/c tomorrow.  Hopefully will see continued improvement.  No RRT indications. Avoid nephrotoxins and avoid hypotension.   **AMS:  CT w/o acute issue.  Probably multifactorial, ? Valtrex toxicity with dysarthria but also poly substance abuse and Withdraw.  APpears improving.  Per primary.    **Shingles:  Holding valtrex for renal and neurotoxicity.   **HTN:  Chronic poorly controlled HTN; resumed home meds with hold parameters to avoid drops in BP.   **Hyponatremia, hypovolemic: improved with hydration.   **DM: per primary  **EtoH, drug use - cocaine: monitor/treat for withdrawal.   Will follow page with  issues.  Estill Bakes MD 04/16/2020, 8:24 AM  [facility] Pager: [phone]

## 2020-04-16 NOTE — Progress Notes (Signed)
PROGRESS NOTE  William Acevedo VXB:[id number] DOB: [birth year] DOA: 04/14/2020 PCP: Priscille Kluver, MD   LOS: 2 days   Brief narrative:  William Acevedo is a 65 y.o. male with medical history significant of hypertension, hyperlipidemia, CVA, uncontrolled diabetes mellitus type 2, renal infarct, major depressive disorder, and polysubstance abuse(cocaine and marijuana) presented to the hospital with weakness, nausea vomiting and bright red blood per rectum.  Patient also had abdominal and right chest wall pain. He had just recently been seen in the emergency department 3 days ago with complaints of right chest wall pain and had been diagnosed with herpes zoster.    Patient has history of multifocal brain ischemia and was admitted from 3/17-3/20.  He had been started on aspirin and Pepcid.  GI had also been formally consulted during that time due to streaks of blood but recommended outpatient work-up EGD and colonoscopy as patient had never had one before.  This time in the ED, patient was mildly tachycardic.  Hemoglobin was 12.6.  Creatinine was elevated at >5.0.  CT scan of the abdomen pelvis significant for trace free pelvic fluid and new bilateral appearing renal stranding without hydronephrosis or hydroureter and punctate nonobstructive renal calculi in the right and possibly in the left kidney and prominent stool throughout colon favoring constipation.  Patient was given 500 mL bolus of normal saline IV fluids, morphine, Haldol, and Pepcid IV.  Patient was then admitted to hospital for further evaluation and treatment.  Assessment/Plan:  Principal Problem:   ARF (acute renal failure) (HCC) Active Problems:   Polysubstance abuse (HCC)   Dehydration   Abdominal pain   Acute blood loss anemia   GI bleed   Nausea & vomiting   Acute metabolic encephalopathy   Hypertensive urgency  Acute kidney injury.  Likely prerenal secondary to nausea vomiting poor oral intake prior to  presentation.  Nephrology on board.  Continue to trend creatinine continue IV fluids.  CT scan of the abdomen and Pelvis on 04/14/2020 without any evidence of hydronephrosis.  Will bladder scan. Possibility of poor oral intake, hypertensive urgency with cocaine usage, Valtrex toxicity or combination of all of this contributing to acute kidney injury.  Currently on 100 mL of normal saline per hour.. We will follow nephrology recommendation. Mildly decreasing creatinine levels.  Continue Foley catheter due to urinary retention and significant acute kidney injury.  Continue to monitor intake and output charting.  Lab Results  Component Value Date   CREATININE 5.01 (H) 04/16/2020   CREATININE 5.38 (H) 04/15/2020   CREATININE 5.24 (H) 04/14/2020    Nausea, vomiting, and abdominal pain with GI bleed. Seen by GI.  Stool guaiac positive.    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  Continue Protonix and Carafate. continue hydration.  Type and cross and transfuse as necessary.  Hemoglobin of 11.3 today.  CBC Latest Ref Rng & Units 04/16/2020 04/15/2020 04/14/2020  WBC 4.0 - 10.5 K/uL 7.4 8.1 -  Hemoglobin 13.0 - 17.0 g/dL 11.3(L) 12.7(L) 12.2(L)  Hematocrit 39.0 - 52.0 % 33.4(L) 36.9(L) 35.1(L)  Platelets 150 - 400 K/uL 171 183 -    Uncontrolled diabetes mellitus type 2.  On insulin regimen at this time.  Continue Lantus sliding scale insulin. Hemoglobin A1c of 10.6 on 3/22.  Acute on chronic hyponatremia: likely secondary to volume dilution..  Latest sodium of 133.  Hypertensive urgency  Chronically poorly controlled hypertension.  Exacerbated by cocaine use.  On oral and as needed hydralazine.  Losartan on hold due  to acute kidney injury.  We will add amlodipine today.  Acute metabolic encephalopathy: Likely multifactorial.  Improved exam.  History of recent stroke.  Cocaine abuse.   Herpes zoster:  of the right back and chest wall 3 days ago .Valtrex has been discontinued at this time due  to AKI.[name]  History of CVA:  MRI from 03/25/20 noted multifocal acute ischemia with in both cerebellar hemispheres the right occipital lobe, and both frontal lobes.  Patient had undergone a TEE that gave concern for the possibility of possible small density on the mitral valve concerning for possible vegetation and concern for small PFO.  Blood cultures during that hospitalization however were noted to be negative.  Aspirin on hold.  Continue statins  Polysubstance abuse: UDS positive for cocaine and marijuana.  Again counseled about it.  DVT prophylaxis: SCDs Start: 04/14/20 1529   Code Status: Full code  Family Communication:  None today. I spoke with the patient's brother on the phone and updated him about the clinical condition yesterday  Status is: Inpatient  Remains inpatient appropriate because:IV treatments appropriate due to intensity of illness or inability to take PO, Inpatient level of care appropriate due to severity of illness and Acute kidney injury, closer monitoring of hemoglobin   Dispo: The patient is from: Home              Anticipated d/c is to: Home              Patient currently is not medically stable to d/c.   Difficult to place patient No   Consultants:  GI  Nephrology  Procedures:  Foley catheter  Anti-infectives:  . none  Anti-infectives (From admission, onward)   Start     Dose/Rate Route Frequency Ordered Stop   04/14/20 1715  valACYclovir (VALTREX) tablet 1,000 mg  Status:  Discontinued        1,000 mg Oral 3 times daily 04/14/20 1702 04/14/20 1708     Subjective: Today, he was seen and examined at bedside.  Complains of some itching over the body.  Denies any nausea vomiting fever or chills.  Objective: Vitals:   04/15/20 2108 04/16/20 0516  BP: (!) 173/93 (!) 174/91  Pulse: 98 94  Resp: 18 18  Temp: 98.4 F (36.9 C) 98.1 F (36.7 C)  SpO2: 100% 99%    Intake/Output Summary (Last 24 hours) at 04/16/2020 0705 Last data filed at  04/16/2020 0500 Gross per 24 hour  Intake 3282.53 ml  Output 3450 ml  Net -167.47 ml   Filed Weights   04/14/20 0951 04/14/20 2034  Weight: 56 kg 56 kg   Body mass index is 19.93 kg/m.   Physical Exam: General: Thinly built, not in obvious distress, alert awake and communicative HENT:   No scleral pallor or icterus noted. Oral mucosa is moist.  Chest:  Diminished breath sounds bilaterally. No crackles or wheezes.  CVS: S1 &S2 heard. No murmur.  Regular rate and rhythm. Abdomen: Soft, nontender, nondistended.  Bowel sounds are heard.  Foley catheter in place. Extremities: No cyanosis, clubbing or edema.  Peripheral pulses are palpable. Psych: Alert, awake and communicative CNS:  No cranial nerve deficits.  Moves all extremities Skin: Warm and dry.  Zoster lesion on the chest wall with mild erythema.   Data Review: I have personally reviewed the following laboratory data and studies,  CBC: Recent Labs  Lab 04/11/20 1757 04/14/20 1043 04/14/20 1800 04/15/20 1103 04/16/20 0354  WBC 6.5 7.2  --  8.1 7.4  NEUTROABS 4.0 5.9  --   --   --   HGB 15.4 12.6* 12.2* 12.7* 11.3*  HCT 45.8 36.9* 35.1* 36.9* 33.4*  MCV 85.8 83.3  --  83.9 83.1  PLT 307 179  --  183 171   Basic Metabolic Panel: Recent Labs  Lab 04/11/20 1757 04/14/20 1043 04/14/20 1800 04/15/20 1103 04/16/20 0354  NA 130* 124* 125* 128* 133*  K 3.4* 4.7 4.5 4.6 4.0  CL 90* 87* 91* 95* 101  CO2 27 26 24 22 23   GLUCOSE 164* 403* 424* 328* 133*  BUN 24* 47* 49* 49* 54*  CREATININE 1.20 5.07* 5.24* 5.38* 5.01*  CALCIUM 9.6 8.6* 8.3* 8.4* 8.2*  MG  --   --   --   --  2.1  PHOS  --   --   --   --  3.8   Liver Function Tests: Recent Labs  Lab 04/11/20 1757 04/14/20 1043  AST 27 27  ALT 24 18  ALKPHOS 57 56  BILITOT 0.9 0.6  PROT 7.5 6.4*  ALBUMIN 3.8 3.2*   Recent Labs  Lab 04/11/20 1757 04/14/20 1043  LIPASE 19 41   No results for input(s): AMMONIA in the last 168 hours. Cardiac  Enzymes: Recent Labs  Lab 04/14/20 1600  CKTOTAL 171   BNP (last 3 results) No results for input(s): BNP in the last 8760 hours.  ProBNP (last 3 results) No results for input(s): PROBNP in the last 8760 hours.  CBG: Recent Labs  Lab 04/15/20 1145 04/15/20 1656 04/15/20 2123 04/16/20 0017 04/16/20 0402  GLUCAP 317* 205* 200* 218* 134*   Recent Results (from the past 240 hour(s))  Resp Panel by RT-PCR (Flu A&B, Covid) Nasopharyngeal Swab     Status: None   Collection Time: 04/14/20  3:12 PM   Specimen: Nasopharyngeal Swab; Nasopharyngeal(NP) swabs in vial transport medium  Result Value Ref Range Status   SARS Coronavirus 2 by RT PCR NEGATIVE NEGATIVE Final    Comment: (NOTE) SARS-CoV-2 target nucleic acids are NOT DETECTED.  The SARS-CoV-2 RNA is generally detectable in upper respiratory specimens during the acute phase of infection. The lowest concentration of SARS-CoV-2 viral copies this assay can detect is 138 copies/mL. A negative result does not preclude SARS-Cov-2 infection and should not be used as the sole basis for treatment or other patient management decisions. A negative result may occur with  improper specimen collection/handling, submission of specimen other than nasopharyngeal swab, presence of viral mutation(s) within the areas targeted by this assay, and inadequate number of viral copies(<138 copies/mL). A negative result must be combined with clinical observations, patient history, and epidemiological information. The expected result is Negative.  Fact Sheet for Patients:  04/16/20  Fact Sheet for Healthcare Providers:  BloggerCourse.com  This test is no t yet approved or cleared by the [place] FDA and  has been authorized for detection and/or diagnosis of SARS-CoV-2 by FDA under an Emergency Use Authorization (EUA). This EUA will remain  in effect (meaning this test can be used) for  the duration of the COVID-19 declaration under Section 564(b)(1) of the Act, 21 U.S.C.section 360bbb-3(b)(1), unless the authorization is terminated  or revoked sooner.       Influenza A by PCR NEGATIVE NEGATIVE Final   Influenza B by PCR NEGATIVE NEGATIVE Final    Comment: (NOTE) The Xpert Xpress SARS-CoV-2/FLU/RSV plus assay is intended as an aid in the diagnosis of influenza from Nasopharyngeal swab specimens and should  not be used as a sole basis for treatment. Nasal washings and aspirates are unacceptable for Xpert Xpress SARS-CoV-2/FLU/RSV testing.  Fact Sheet for Patients: BloggerCourse.comhttps://www.fda.gov/media/152166/download  Fact Sheet for Healthcare Providers: SeriousBroker.ithttps://www.fda.gov/media/152162/download  This test is not yet approved or cleared by the [place] FDA and has been authorized for detection and/or diagnosis of SARS-CoV-2 by FDA under an Emergency Use Authorization (EUA). This EUA will remain in effect (meaning this test can be used) for the duration of the COVID-19 declaration under Section 564(b)(1) of the Act, 21 U.S.C. section 360bbb-3(b)(1), unless the authorization is terminated or revoked.  Performed at [facility], [address] [place], [place] [address]      Studies: CT ABDOMEN PELVIS WO CONTRAST  Result Date: 04/14/2020 CLINICAL DATA:  Acute abdominal pain.  New renal failure. EXAM: CT ABDOMEN AND PELVIS WITHOUT CONTRAST TECHNIQUE: Multidetector CT imaging of the abdomen and pelvis was performed following the standard protocol without IV contrast. COMPARISON:  04/02/2020 FINDINGS: Lower chest: Right coronary artery and descending thoracic aortic atherosclerotic calcification. Calcification along the mitral valve. No overt cardiomegaly. Minimal dependent subsegmental atelectasis in both lower lobes. Hepatobiliary: Unremarkable Pancreas: Unremarkable Spleen: Unremarkable Adrenals/Urinary Tract: Both adrenal glands appear normal. Five punctate  calcifications suspicious for nonobstructive renal calculi in the right kidney, measuring up to 0.3 cm in diameter. Questionable punctate 1-2 mm left mid to lower renal calculi. Faint bilateral perirenal stranding appears increased from prior. Small hypodense lesion in the right mid kidney appears stable. No hydronephrosis or hydroureter. The urinary bladder appears unremarkable. Stomach/Bowel: Prominent stool throughout the colon favors constipation. No dilated small bowel identified. The appendix is indistinct. Paucity of intra-adipose tissue making separation of loops problematic. Vascular/Lymphatic: Dense aortoiliac atherosclerotic vascular calcification noted. Reproductive: Unremarkable Other: Trace free pelvic fluid for example eccentric to the right on image 65 series 3, increased from 04/02/2020. Cannot exclude low-grade mesenteric edema given the paucity of intra-adipose tissue. Musculoskeletal: Mild bilateral foraminal stenosis at L4-5 due to disc bulge and facet arthropathy. IMPRESSION: 1. New trace free pelvic fluid in new mild bilateral perirenal stranding without hydronephrosis or hydroureter. 2. Punctate nonobstructive renal calculi on the right and possibly the left. 3. Nonvisualization of the appendix on today's exam. 4.  Prominent stool throughout the colon favors constipation. 5. Other imaging findings of potential clinical significance: Aortic Atherosclerosis (ICD10-I70.0). Coronary atherosclerosis. Mitral valve calcification. Mild bilateral foraminal impingement at L4-5. Electronically Signed   By: Gaylyn RongWalter  Liebkemann M.D.   On: 04/14/2020 14:59   CT Head Wo Contrast  Result Date: 04/14/2020 CLINICAL DATA:  Altered mental status. EXAM: CT HEAD WITHOUT CONTRAST TECHNIQUE: Contiguous axial images were obtained from the base of the skull through the vertex without intravenous contrast. COMPARISON:  March 25, 2020. FINDINGS: Brain: Mild chronic ischemic white matter disease is noted. Old left  occipital infarction is noted. No mass effect or midline shift is noted. Ventricular size is within normal limits. There is no evidence of mass lesion, hemorrhage or acute infarction. Vascular: No hyperdense vessel or unexpected calcification. Skull: Normal. Negative for fracture or focal lesion. Sinuses/Orbits: No acute finding. Other: None. IMPRESSION: Mild chronic ischemic white matter disease. Old left occipital infarction. No acute intracranial abnormality seen. Electronically Signed   By: Lupita RaiderJames  Green Jr M.D.   On: 04/14/2020 12:40     Joycelyn DasLaxman Claudy Abdallah, MD  [facility] 04/16/2020  If 7PM-7AM, please contact night-coverage

## 2020-04-17 LAB — RENAL FUNCTION PANEL
Albumin: 2.5 g/dL — ABNORMAL LOW (ref 3.5–5.0)
Anion gap: 9 (ref 5–15)
BUN: 51 mg/dL — ABNORMAL HIGH (ref 8–23)
CO2: 23 mmol/L (ref 22–32)
Calcium: 8.2 mg/dL — ABNORMAL LOW (ref 8.9–10.3)
Chloride: 102 mmol/L (ref 98–111)
Creatinine, Ser: 3.74 mg/dL — ABNORMAL HIGH (ref 0.61–1.24)
GFR, Estimated: 17 mL/min — ABNORMAL LOW (ref >60)
Glucose, Bld: 226 mg/dL — ABNORMAL HIGH (ref 70–99)
Phosphorus: 3.4 mg/dL (ref 2.5–4.6)
Potassium: 3.5 mmol/L (ref 3.5–5.1)
Sodium: 134 mmol/L — ABNORMAL LOW (ref 135–145)

## 2020-04-17 LAB — CBC
HCT: 32.5 % — ABNORMAL LOW (ref 39.0–52.0)
Hemoglobin: 11.3 g/dL — ABNORMAL LOW (ref 13.0–17.0)
MCH: 28.5 pg (ref 26.0–34.0)
MCHC: 34.8 g/dL (ref 30.0–36.0)
MCV: 81.9 fL (ref 80.0–100.0)
Platelets: 168 10*3/uL (ref 150–400)
RBC: 3.97 MIL/uL — ABNORMAL LOW (ref 4.22–5.81)
RDW: 12.2 % (ref 11.5–15.5)
WBC: 5.6 10*3/uL (ref 4.0–10.5)
nRBC: 0 % (ref 0.0–0.2)

## 2020-04-17 LAB — GLUCOSE, CAPILLARY
Glucose-Capillary: 154 mg/dL — ABNORMAL HIGH (ref 70–99)
Glucose-Capillary: 227 mg/dL — ABNORMAL HIGH (ref 70–99)
Glucose-Capillary: 201 mg/dL — ABNORMAL HIGH (ref 70–99)
Glucose-Capillary: 278 mg/dL — ABNORMAL HIGH (ref 70–99)

## 2020-04-17 LAB — MAGNESIUM: Magnesium: 1.9 mg/dL (ref 1.7–2.4)

## 2020-04-17 MED ORDER — HYDRALAZINE HCL 50 MG PO TABS
50.0000 mg | ORAL_TABLET | Freq: Three times a day (TID) | ORAL | Status: DC
  Administered 2020-04-17 – 2020-04-19 (×7): 50 mg via ORAL
  Filled 2020-04-17 (×6): qty 1

## 2020-04-17 MED ORDER — PANTOPRAZOLE SODIUM 40 MG PO TBEC
40.0000 mg | DELAYED_RELEASE_TABLET | Freq: Two times a day (BID) | ORAL | Status: DC
  Administered 2020-04-17 – 2020-04-19 (×4): 40 mg via ORAL
  Filled 2020-04-17 (×4): qty 1

## 2020-04-17 NOTE — Progress Notes (Signed)
[facility] Progress Note   Subjective:  awake.  Calm.  No complaints.  Hungry. Request foley out. UOP 6L yesterday.  Objective Vitals:   04/16/20 1949 04/17/20 0522 04/17/20 0944 04/17/20 1307  BP: (!) 157/97 (!) 178/98 (!) 180/90 (!) 159/91  Pulse: (!) 108 92 (!) 102 (!) 104  Resp: 20 20 18 18   Temp: 98.8 F (37.1 C) (!) 97.5 F (36.4 C) 98.2 F (36.8 C) 98.1 F (36.7 C)  TempSrc: Oral Oral Oral   SpO2: 100% 97% 100% 100%  Weight:      Height:       Physical Exam General: calm, awake Heart: RRR Lungs: normal WOB on RA Extremities: no edema GU: foley with clear yellow urine in bag  Additional Objective Labs: Basic Metabolic Panel: Recent Labs  Lab 04/15/20 1103 04/16/20 0354 04/17/20 0402  NA 128* 133* 134*  K 4.6 4.0 3.5  CL 95* 101 102  CO2 22 23 23   GLUCOSE 328* 133* 226*  BUN 49* 54* 51*  CREATININE 5.38* 5.01* 3.74*  CALCIUM 8.4* 8.2* 8.2*  PHOS  --  3.8 3.4   Liver Function Tests: Recent Labs  Lab 04/11/20 1757 04/14/20 1043 04/17/20 0402  AST 27 27  --   ALT 24 18  --   ALKPHOS 57 56  --   BILITOT 0.9 0.6  --   PROT 7.5 6.4*  --   ALBUMIN 3.8 3.2* 2.5*   Recent Labs  Lab 04/11/20 1757 04/14/20 1043  LIPASE 19 41   CBC: Recent Labs  Lab 04/11/20 1757 04/14/20 1043 04/14/20 1800 04/15/20 1103 04/16/20 0354 04/17/20 0402  WBC 6.5 7.2  --  8.1 7.4 5.6  NEUTROABS 4.0 5.9  --   --   --   --   HGB 15.4 12.6*   < > 12.7* 11.3* 11.3*  HCT 45.8 36.9*   < > 36.9* 33.4* 32.5*  MCV 85.8 83.3  --  83.9 83.1 81.9  PLT 307 179  --  183 171 168   < > = values in this interval not displayed.   Blood Culture    Component Value Date/Time   SDES BLOOD RIGHT ARM 03/26/2020 1805   SPECREQUEST  03/26/2020 1805    BOTTLES DRAWN AEROBIC ONLY Blood Culture adequate volume   CULT  03/26/2020 1805    NO GROWTH 5 DAYS Performed at [facility], [address], [place], [address] [place]    REPTSTATUS 03/31/2020 FINAL  03/26/2020 1805    Cardiac Enzymes: Recent Labs  Lab 04/14/20 1600  CKTOTAL 171   CBG: Recent Labs  Lab 04/16/20 1323 04/16/20 1653 04/16/20 2225 04/17/20 0700 04/17/20 1153  GLUCAP 204* 201* 292* 154* 227*   Iron Studies: No results for input(s): IRON, TIBC, TRANSFERRIN, FERRITIN in the last 72 hours. @lablastinr3 @ Studies/Results: No results found. Medications:  . amitriptyline  20 mg Oral QHS  . amLODipine  10 mg Oral Daily  . atorvastatin  20 mg Oral Daily  . Chlorhexidine Gluconate Cloth  6 each Topical Daily  . dicyclomine  20 mg Oral TID AC  . docusate sodium  100 mg Oral BID  . famotidine  20 mg Oral Daily  . feeding supplement  237 mL Oral TID BM  . gabapentin  300 mg Oral QHS  . hydrALAZINE  50 mg Oral Q8H  . insulin aspart  0-9 Units Subcutaneous TID WC  . insulin aspart  5 Units Subcutaneous TID WC  . insulin  glargine  20 Units Subcutaneous Q2200  . melatonin  12 mg Oral QHS  . multivitamin with minerals  1 tablet Oral Daily  . pantoprazole  40 mg Oral BID  . sodium chloride flush  3 mL Intravenous Q12H  . sucralfate  1 g Oral BID    Assessment/Plan **AKI, severe: Cr was 1.2 just 3 days prior up to ~5 at admission. Multifactorial with  prerenal with poor po intake, HTN urgency with cocaine, valtrex toxicity.  CT without obstruction.  Improving with hydration, holding valtrex - cr down to 3.74 today and good UOP.  Stop IVF given good po intake.  D/c foley, check PVR qshift x 1 day.  No RRT indications. Avoid nephrotoxins and avoid hypotension.   **Polyuria: most likely post ATN diuresis - follow.   **AMS:  CT w/o acute issue.  Probably multifactorial, ? Valtrex toxicity with dysarthria but also poly substance abuse and Withdraw.  resolved.  Per primary.    **Shingles:  Holding valtrex for renal and neurotoxicity.   **HTN:  Chronic poorly controlled HTN; resumed home meds with hold parameters to avoid drops in BP.   **DM: per primary  **EtoH,  drug use - cocaine: monitor/treat for withdrawal.  Says has used cocaine everyday for 45 years?  Will follow page with issues.  Estill Bakes MD 04/17/2020, 2:00 PM  [facility] Pager: [phone]

## 2020-04-17 NOTE — Plan of Care (Signed)
  Problem: Clinical Measurements: Goal: Will remain free from infection Outcome: Progressing   Problem: Education: Goal: Knowledge of disease and its progression will improve Outcome: Progressing   Problem: Urinary Elimination: Goal: Progression of disease will be identified and treated Outcome: Progressing

## 2020-04-17 NOTE — Progress Notes (Addendum)
PROGRESS NOTE  William Acevedo ZOX:[id number] DOB: 1955-12-14 DOA: 04/14/2020 PCP: Priscille Kluver, MD   LOS: 3 days   Brief narrative:  William Acevedo is a 65 y.o. male with medical history significant of hypertension, hyperlipidemia, CVA, uncontrolled diabetes mellitus type 2, renal infarct, major depressive disorder, and polysubstance abuse(cocaine and marijuana) presented to the hospital with weakness, nausea vomiting and bright red blood per rectum.  Patient also had abdominal and right chest wall pain. He had just recently been seen in the emergency department 3 days ago with complaints of right chest wall pain and had been diagnosed with herpes zoster.    Patient has history of multifocal brain ischemia and was admitted from 3/17-3/20.  He had been started on aspirin and Pepcid.  GI had also been formally consulted during that time due to streaks of blood but recommended outpatient work-up EGD and colonoscopy as patient had never had one before.  This time in the ED, patient was mildly tachycardic.  Hemoglobin was 12.6.  Creatinine was elevated at >5.0.   CT scan of the abdomen pelvis significant for trace free pelvic fluid and new bilateral appearing renal stranding without hydronephrosis or hydroureter and punctate nonobstructive renal calculi in the right and possibly in the left kidney and prominent stool throughout colon favoring constipation.  Patient was given 500 mL bolus of normal saline IV fluids, morphine, Haldol, and Pepcid IV.  Patient was then admitted to hospital for further evaluation and treatment.   During hospitalization, patient had urinary retention and a Foley catheter was placed in.  Acute kidney injury is improving.  On IV fluids.  Nephrology is following.  Assessment/Plan:  Principal Problem:   ARF (acute renal failure) (HCC) Active Problems:   Polysubstance abuse (HCC)   Dehydration   Abdominal pain   Acute blood loss anemia   GI bleed   Nausea &  vomiting   Acute metabolic encephalopathy   Hypertensive urgency  Acute kidney injury.    Possibility of poor oral intake, hypertensive urgency with cocaine usage, Valtrex toxicity or combination of all of this contributing to acute kidney injury. CT scan of the abdomen and Pelvis on 04/14/2020 without any evidence of hydronephrosis.  Bladder scan with urinary retention so a Foley catheter was placed.  Creatinine improving.   Currently on 75 mL of normal saline per hour.. Continue Foley catheter due to urinary retention and significant acute kidney injury.  Continue to monitor intake and output charting.  Follow nephrology recommendation.  Lab Results  Component Value Date   CREATININE 3.74 (H) 04/17/2020   CREATININE 5.01 (H) 04/16/2020   CREATININE 5.38 (H) 04/15/2020     Nausea, vomiting, and abdominal pain with GI bleed. Resolved at this time.  Seen by GI.  Stool guaiac was positive.    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  Continue Protonix and Carafate. continue hydration.  Type and cross and transfuse as necessary.  Hemoglobin of 11.3 today.  CBC Latest Ref Rng & Units 04/17/2020 04/16/2020 04/15/2020  WBC 4.0 - 10.5 K/uL 5.6 7.4 8.1  Hemoglobin 13.0 - 17.0 g/dL 11.3(L) 11.3(L) 12.7(L)  Hematocrit 39.0 - 52.0 % 32.5(L) 33.4(L) 36.9(L)  Platelets 150 - 400 K/uL 168 171 183     Uncontrolled diabetes mellitus type 2.  On insulin regimen at this time.  Continue Lantus, sliding scale insulin. Hemoglobin A1c of 10.6 on 3/22.  53  Hyponatremia: likely secondary to volume depletion..  Latest sodium of 134.  Still on some normal  saline.   Hypertensive urgency  Chronically poorly controlled hypertension.  Exacerbated by cocaine use.  On oral and as needed hydralazine.  Will increase hydralazine to 50 mg 3 times daily from 25 mg 3 times daily.  Losartan on hold due to acute kidney injury.  Added amlodipine yesterday.  We will continue to monitor closely.  Acute metabolic encephalopathy:  Likely multifactorial.  Improved at this time.  At baseline.  History of stroke.  Herpes zoster:  of the right back and chest wall diagnosed recently.  Was on Valtrex but Valtrex has been discontinued at this time due to AKI.[name]   History of CVA:  During the previous admission MRI from 03/25/20 noted multifocal acute ischemia with in both cerebellar hemispheres the right occipital lobe, and both frontal lobes.  Patient had undergone a TEE that gave concern for the possibility of possible small density on the mitral valve concerning for possible vegetation and concern for small PFO.  Blood cultures during that hospitalization however were noted to be negative.  Aspirin on hold.  Continue statins   Polysubstance abuse: UDS positive for cocaine and marijuana presentation.  Counseling done.  Deconditioning debility.  Will get PT OT evaluation.  Patient might need skilled nursing facility placement  Severe protein calorie malnutrition.  Present on admission.  Continue nutritional supplements.  Seen by dietary during hospitalization.  DVT prophylaxis: SCDs Start: 04/14/20 1529   Code Status: Full code  Family Communication:  None today.  Status is: Inpatient  Remains inpatient appropriate because:IV treatments appropriate due to intensity of illness or inability to take PO, Inpatient level of care appropriate due to severity of illness and Acute kidney injury, closer monitoring of hemoglobin, urinary retention.  Dispo: The patient is from: Home              Anticipated d/c is to: Home with home health versus skilled nursing facility.  PT OT requested.    Barriers to discharge: include accelerated hypertension, urinary retention Foley catheter, acute kidney injury, possible need for rehabilitation placement.              Patient currently is not medically stable to d/c.   Difficult to place patient No   Consultants: GI Nephrology  Procedures: Foley  catheter  Anti-infectives:  none  Anti-infectives (From admission, onward)    Start     Dose/Rate Route Frequency Ordered Stop   04/14/20 1715  valACYclovir (VALTREX) tablet 1,000 mg  Status:  Discontinued        1,000 mg Oral 3 times daily 04/14/20 1702 04/14/20 1708      Subjective: Today, patient was seen and examined at bedside.  Denies any nausea vomiting fever chills shortness of breath or fever.  Objective: Vitals:   04/17/20 0522 04/17/20 0944  BP: (!) 178/98 (!) 180/90  Pulse: 92 (!) 102  Resp: 20 18  Temp: (!) 97.5 F (36.4 C) 98.2 F (36.8 C)  SpO2: 97% 100%    Intake/Output Summary (Last 24 hours) at 04/17/2020 1139 Last data filed at 04/17/2020 1000 Gross per 24 hour  Intake 2451.39 ml  Output 6400 ml  Net -3948.61 ml   Filed Weights   04/14/20 0951 04/14/20 2034  Weight: 56 kg 56 kg   Body mass index is 19.93 kg/m.   Physical Exam: General: Thinly built, not in obvious distress, alert awake and communicative HENT:   No scleral pallor or icterus noted. Oral mucosa is moist.  Chest:  Diminished breath sounds bilaterally. No crackles  or wheezes.  CVS: S1 &S2 heard. No murmur.  Regular rate and rhythm. Abdomen: Soft, nontender, nondistended.  Bowel sounds are heard.  Foley catheter in place. Extremities: No cyanosis, clubbing or edema.  Peripheral pulses are palpable. Psych: Alert, awake and communicative CNS:  No cranial nerve deficits.  Nonfocal. Skin: Warm and dry.  Dried herpes zoster lesion on the chest wall with mild erythema.  Data Review: I have personally reviewed the following laboratory data and studies,  CBC: Recent Labs  Lab 04/11/20 1757 04/14/20 1043 04/14/20 1800 04/15/20 1103 04/16/20 0354 04/17/20 0402  WBC 6.5 7.2  --  8.1 7.4 5.6  NEUTROABS 4.0 5.9  --   --   --   --   HGB 15.4 12.6* 12.2* 12.7* 11.3* 11.3*  HCT 45.8 36.9* 35.1* 36.9* 33.4* 32.5*  MCV 85.8 83.3  --  83.9 83.1 81.9  PLT 307 179  --  183 171 168   Basic  Metabolic Panel: Recent Labs  Lab 04/14/20 1043 04/14/20 1800 04/15/20 1103 04/16/20 0354 04/17/20 0402  NA 124* 125* 128* 133* 134*  K 4.7 4.5 4.6 4.0 3.5  CL 87* 91* 95* 101 102  CO2 26 24 22 23 23   GLUCOSE 403* 424* 328* 133* 226*  BUN 47* 49* 49* 54* 51*  CREATININE 5.07* 5.24* 5.38* 5.01* 3.74*  CALCIUM 8.6* 8.3* 8.4* 8.2* 8.2*  MG  --   --   --  2.1 1.9  PHOS  --   --   --  3.8 3.4   Liver Function Tests: Recent Labs  Lab 04/11/20 1757 04/14/20 1043 04/17/20 0402  AST 27 27  --   ALT 24 18  --   ALKPHOS 57 56  --   BILITOT 0.9 0.6  --   PROT 7.5 6.4*  --   ALBUMIN 3.8 3.2* 2.5*   Recent Labs  Lab 04/11/20 1757 04/14/20 1043  LIPASE 19 41   No results for input(s): AMMONIA in the last 168 hours. Cardiac Enzymes: Recent Labs  Lab 04/14/20 1600  CKTOTAL 171   BNP (last 3 results) No results for input(s): BNP in the last 8760 hours.  ProBNP (last 3 results) No results for input(s): PROBNP in the last 8760 hours.  CBG: Recent Labs  Lab 04/16/20 1130 04/16/20 1323 04/16/20 1653 04/16/20 2225 04/17/20 0700  GLUCAP 479* 204* 201* 292* 154*   Recent Results (from the past 240 hour(s))  Resp Panel by RT-PCR (Flu A&B, Covid) Nasopharyngeal Swab     Status: None   Collection Time: 04/14/20  3:12 PM   Specimen: Nasopharyngeal Swab; Nasopharyngeal(NP) swabs in vial transport medium  Result Value Ref Range Status   SARS Coronavirus 2 by RT PCR NEGATIVE NEGATIVE Final    Comment: (NOTE) SARS-CoV-2 target nucleic acids are NOT DETECTED.  The SARS-CoV-2 RNA is generally detectable in upper respiratory specimens during the acute phase of infection. The lowest concentration of SARS-CoV-2 viral copies this assay can detect is 138 copies/mL. A negative result does not preclude SARS-Cov-2 infection and should not be used as the sole basis for treatment or other patient management decisions. A negative result may occur with  improper specimen  collection/handling, submission of specimen other than nasopharyngeal swab, presence of viral mutation(s) within the areas targeted by this assay, and inadequate number of viral copies(<138 copies/mL). A negative result must be combined with clinical observations, patient history, and epidemiological information. The expected result is Negative.  Fact Sheet for Patients:  04/16/20  Fact Sheet for Healthcare Providers:  SeriousBroker.ithttps://www.fda.gov/media/152162/download  This test is no t yet approved or cleared by the [place] FDA and  has been authorized for detection and/or diagnosis of SARS-CoV-2 by FDA under an Emergency Use Authorization (EUA). This EUA will remain  in effect (meaning this test can be used) for the duration of the COVID-19 declaration under Section 564(b)(1) of the Act, 21 U.S.C.section 360bbb-3(b)(1), unless the authorization is terminated  or revoked sooner.       Influenza A by PCR NEGATIVE NEGATIVE Final   Influenza B by PCR NEGATIVE NEGATIVE Final    Comment: (NOTE) The Xpert Xpress SARS-CoV-2/FLU/RSV plus assay is intended as an aid in the diagnosis of influenza from Nasopharyngeal swab specimens and should not be used as a sole basis for treatment. Nasal washings and aspirates are unacceptable for Xpert Xpress SARS-CoV-2/FLU/RSV testing.  Fact Sheet for Patients: BloggerCourse.comhttps://www.fda.gov/media/152166/download  Fact Sheet for Healthcare Providers: SeriousBroker.ithttps://www.fda.gov/media/152162/download  This test is not yet approved or cleared by the [place] FDA and has been authorized for detection and/or diagnosis of SARS-CoV-2 by FDA under an Emergency Use Authorization (EUA). This EUA will remain in effect (meaning this test can be used) for the duration of the COVID-19 declaration under Section 564(b)(1) of the Act, 21 U.S.C. section 360bbb-3(b)(1), unless the authorization is terminated or revoked.  Performed at [facility], [address] [place], [place] [address]      Studies: No results found.   Joycelyn DasLaxman Nachman Sundt, MD  [facility] 04/17/2020  If 7PM-7AM, please contact night-coverage

## 2020-04-17 NOTE — Progress Notes (Addendum)
   04/17/20 1005  Clinical Encounter Type  Visited With Patient;Family  Visit Type Follow-up  Referral From Nurse  Consult/Referral To Chaplain  Spiritual Encounters  Spiritual Needs Literature (HCPOA)  Chaplain visited Mr. Santibanez and helped him with completing the HCPOA, paperwork was notarized.  Original given to Mr. Savarese, and 2 copies given to his brother Mr. Kodee Ravert who is the Secondary and the Primary is Mr. Mcnutt son Ines Bloomer. A copy was placed in his chart.    Both were very grateful.  Chaplain Gordie Belvin Morgan-Simpson [phone]

## 2020-04-17 NOTE — TOC Progression Note (Addendum)
Transition of Care [facility]) - Progression Note    Patient Details  Name: William Acevedo MRN: [id number] Date of Birth: 01-Aug-1955  [facility] of [facility]) CM/SW Contact  Okey Dupre Lazaro Arms, LCSW Phone Number: 04/17/2020, 1:49 PM  Clinical Narrative:  CSW talked with Mallie Darting, SW [place] [phone] - pager & phone number [phone], ext. [phone]) regarding patient and asked which [facility] provided his services and who was his SW. Leavy Cella indicated that she is patient's SW. CSW advised that patient is zero percent service connected and will use his [facility] Medicare for SNF placement. Jasmine also asked that her contact information be provided to patient's brother and the skilled nursing facility.  Visited later with patient and updated him on the conversation with [facility] SW and initiating SNF search and he remains in agreement.   **Awaiting PT/OT evaluations.    Expected Discharge Plan: Skilled Nursing Facility Barriers to Discharge: Continued Medical Work up  Expected Discharge Plan and Services Expected Discharge Plan: Skilled Nursing Facility In-house Referral: Clinical Social Work     Living arrangements for the past 2 months: Single Family Home                                       Social Determinants of Health (SDOH) Interventions    Readmission Risk Interventions No flowsheet data found.

## 2020-04-18 LAB — CBC
HCT: 32.8 % — ABNORMAL LOW (ref 39.0–52.0)
Hemoglobin: 11.4 g/dL — ABNORMAL LOW (ref 13.0–17.0)
MCH: 29 pg (ref 26.0–34.0)
MCHC: 34.8 g/dL (ref 30.0–36.0)
MCV: 83.5 fL (ref 80.0–100.0)
Platelets: 184 10*3/uL (ref 150–400)
RBC: 3.93 MIL/uL — ABNORMAL LOW (ref 4.22–5.81)
RDW: 12.2 % (ref 11.5–15.5)
WBC: 5.9 10*3/uL (ref 4.0–10.5)
nRBC: 0 % (ref 0.0–0.2)

## 2020-04-18 LAB — BASIC METABOLIC PANEL
Anion gap: 10 (ref 5–15)
BUN: 46 mg/dL — ABNORMAL HIGH (ref 8–23)
CO2: 26 mmol/L (ref 22–32)
Calcium: 8.5 mg/dL — ABNORMAL LOW (ref 8.9–10.3)
Chloride: 98 mmol/L (ref 98–111)
Creatinine, Ser: 2.93 mg/dL — ABNORMAL HIGH (ref 0.61–1.24)
GFR, Estimated: 23 mL/min — ABNORMAL LOW (ref >60)
Glucose, Bld: 277 mg/dL — ABNORMAL HIGH (ref 70–99)
Potassium: 3.8 mmol/L (ref 3.5–5.1)
Sodium: 134 mmol/L — ABNORMAL LOW (ref 135–145)

## 2020-04-18 LAB — GLUCOSE, CAPILLARY
Glucose-Capillary: 115 mg/dL — ABNORMAL HIGH (ref 70–99)
Glucose-Capillary: 220 mg/dL — ABNORMAL HIGH (ref 70–99)
Glucose-Capillary: 231 mg/dL — ABNORMAL HIGH (ref 70–99)
Glucose-Capillary: 214 mg/dL — ABNORMAL HIGH (ref 70–99)

## 2020-04-18 LAB — MAGNESIUM: Magnesium: 1.8 mg/dL (ref 1.7–2.4)

## 2020-04-18 NOTE — Evaluation (Signed)
Occupational Therapy Evaluation Patient Details Name: William Acevedo MRN: [id number] DOB: 1955/09/06 Today's Date: 04/18/2020    History of Present Illness Pt is a 65 yo male originally admitted to ED on 3/9 with 2-week history of abdominal pain, N/V, R weakness and incoordination. CT abdomen demonstrates possible renal infarct vs pyelonephritis. CT head shows chronic L occipital infarct, while MRI brain shows multifocal acute ischemia within cerebellar hemispheres, R occipital lobe, and bilat frontal lobes concerning for cardioembolic pattern. Pt with elevated troponin concerning for NSTEMI, cardiology following. PMH includes diabetes, current smoker, depression, neuropathy, PSA including cocaine use with history of overdose, encephalopathy.  Admitted 3/29 for essentially the same symptoms as above: presents with complaints of weakness with nausea and vomiting, right chest wall pain.   Clinical Impression   Patient admitted for the diagnosis above.  PTA he was living with his brother in a one story home.  Patient states he did not drive, but was independent with ADL and mobility.  Deficit is listed below.  Currently he is needing setup and supervision for ADL and in room mobility.  Patient admits to being a little unsteady, but did not have any LOB.  He would benefit from initial 24 hour supervision at home, if the brother can assist him with that, [facility] rehab is an option.  If not, a short SNF stay would be beneficial to maximize functional status prior to home.      Follow Up Recommendations  Home health OT;SNF    Equipment Recommendations  None recommended by OT    Recommendations for Other Services       Precautions / Restrictions Precautions Precautions: Fall Restrictions Weight Bearing Restrictions: No      Mobility Bed Mobility Overal bed mobility: Independent                  Transfers Overall transfer level: Needs assistance   Transfers: Sit to/from Stand;Stand  Pivot Transfers Sit to Stand: Supervision Stand pivot transfers: Supervision            Balance Overall balance assessment: Mild deficits observed, not formally tested                                         ADL either performed or assessed with clinical judgement   ADL Overall ADL's : Needs assistance/impaired Eating/Feeding: Independent   Grooming: Wash/dry hands;Wash/dry face;Supervision/safety;Standing           Upper Body Dressing : Set up;Sitting   Lower Body Dressing: Supervision/safety;Sit to/from stand   Toilet Transfer: Supervision/safety;Comfort height toilet;Ambulation           Functional mobility during ADLs: Supervision/safety       Vision Baseline Vision/History: Wears glasses Wears Glasses: At all times       Perception     Praxis      Pertinent Vitals/Pain Pain Assessment: No/denies pain     Hand Dominance Right   Extremity/Trunk Assessment Upper Extremity Assessment Upper Extremity Assessment: Overall WFL for tasks assessed   Lower Extremity Assessment Lower Extremity Assessment: Defer to PT evaluation   Cervical / Trunk Assessment Cervical / Trunk Assessment: Normal   Communication Communication Communication: No difficulties   Cognition Arousal/Alertness: Awake/alert Behavior During Therapy: WFL for tasks assessed/performed;Impulsive Overall Cognitive Status: Within Functional Limits for tasks assessed  General Comments   VSS on RA    Exercises     Shoulder Instructions      Home Living Family/patient expects to be discharged to:: Private residence Living Arrangements: Other relatives (Brother) Available Help at Discharge: Family Type of Home: House Home Access: Ramped entrance     Home Layout: One level     Bathroom Shower/Tub: Chief Strategy Officer: Standard Bathroom Accessibility: Yes How Accessible: Accessible via  walker Home Equipment: Bedside commode;Shower seat;Cane - single point;Crutches;Wheelchair - manual          Prior Functioning/Environment Level of Independence: Independent        Comments: works various odd jobs        OT Problem List: Impaired balance (sitting and/or standing)      OT Treatment/Interventions:      OT Goals(Current goals can be found in the care plan section) Acute Rehab OT Goals Patient Stated Goal: I'm a little unsteady on my feet, move better OT Goal Formulation: With patient Time For Goal Achievement: 05/02/20 Potential to Achieve Goals: Good ADL Goals Pt Will Perform Lower Body Bathing: Independently;sit to/from stand Pt Will Perform Lower Body Dressing: Independently;sit to/from stand Pt Will Transfer to Toilet: Independently;ambulating;regular height toilet Pt Will Perform Toileting - Clothing Manipulation and hygiene: Independently;sit to/from stand  OT Frequency: Min 2X/week   Barriers to D/C:    none noted       Co-evaluation              AM-PAC OT "6 Clicks" Daily Activity     Outcome Measure Help from another person eating meals?: None Help from another person taking care of personal grooming?: None Help from another person toileting, which includes using toliet, bedpan, or urinal?: None Help from another person bathing (including washing, rinsing, drying)?: None Help from another person to put on and taking off regular upper body clothing?: None Help from another person to put on and taking off regular lower body clothing?: None 6 Click Score: 24   End of Session Equipment Utilized During Treatment: Gait belt Nurse Communication: Mobility status  Activity Tolerance: Patient tolerated treatment well Patient left: in bed;with call bell/phone within reach  OT Visit Diagnosis: Unsteadiness on feet (R26.81)                Time: 1610-9604 OT Time Calculation (min): 23 min Charges:  OT General Charges $OT Visit: 1 Visit OT  Evaluation $OT Eval Moderate Complexity: 1 Mod OT Treatments $Self Care/Home Management : 8-22 mins  04/18/2020  Rich, OTR/L  Acute Rehabilitation Services  Office:  [phone]   Suzanna Obey 04/18/2020, 11:14 AM

## 2020-04-18 NOTE — Evaluation (Signed)
Physical Therapy Evaluation Patient Details Name: William Acevedo MRN: [id number] DOB: 12/30/1955 Today's Date: 04/18/2020   History of Present Illness  The pt is a 65 yo male presenting 3/29 with c/o weakness and nausea/vomiting. Upon workup, pt found to have intermittent confusion with slow, confused speech. Imaging shows no acute pathology, but lab work reveals acute renal failure. PMH includes: HTN, HLD, CVA, uncontrolled DM II, renal infarct, major depressive disorder, polysubstance abuse (cocaine and marijuana), multiple recent admissions (3/9-3/14, 3/17-3/20, 3/22-3/23, and 3/26) for abdominal pain, shingles, and multifocal acute infarcts.    Clinical Impression  Pt in bed upon arrival of PT, agreeable to evaluation at this time. 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 The pt now presents with limitations in functional mobility and dynamic stability due to above dx, and will continue to benefit from skilled PT to address these deficits. He was able to complete all transfers and hallway ambulation on supervision level, but required intermittent minA with addition of higher-level balance challenges such as quick turns, changes in direction, and head turns while walking. The pt could be safe with 24/7 supervision and assist if possible from pt's brother, if not, short term SNF level therapies would help pt return to full independence with mobility prior to return home. Pt is open to all post-acute rehab options, both SNF and HHPT. Will continue to benefit from skilled PT to progress dynamic stability and strengthening to further reduce risk of falls and fall related injury at home.     Follow Up Recommendations Home health PT;SNF;Supervision/Assistance - 24 hour    Equipment Recommendations  None recommended by PT    Recommendations for Other Services       Precautions / Restrictions  Precautions Precautions: Fall Precaution Comments: low fall Restrictions Weight Bearing Restrictions: No      Mobility  Bed Mobility Overal bed mobility: Independent                  Transfers Overall transfer level: Needs assistance Equipment used: None Transfers: Sit to/from Stand Sit to Stand: Supervision Stand pivot transfers: Supervision       General transfer comment: supervision for safety, no evidence of instability  Ambulation/Gait Ambulation/Gait assistance: Supervision Gait Distance (Feet): 250 Feet Assistive device: None Gait Pattern/deviations: Step-through pattern;Decreased stride length;Narrow base of support;Wide base of support;Drifts right/left;Ataxic Gait velocity: 0.6 m/s Gait velocity interpretation: 1.31 - 2.62 ft/sec, indicative of limited community ambulator General Gait Details: supervision for safety, no physical assist needed, even with balance challenges during gait  Stairs Stairs: Yes Stairs assistance: Min guard Stair Management: One rail Left;Alternating pattern;Forwards Number of Stairs: 4 General stair comments: alternating steps without assist, relies on single UE support  Wheelchair Mobility    Modified Rankin (Stroke Patients Only) Modified Rankin (Stroke Patients Only) Pre-Morbid Rankin Score: No symptoms Modified Rankin: Moderate disability     Balance Overall balance assessment: Mild deficits observed, not formally tested                               Standardized Balance Assessment Standardized Balance Assessment : Dynamic Gait Index   Dynamic Gait Index Level Surface: Normal Change in Gait Speed: Normal Gait with Horizontal Head Turns: Mild Impairment Gait with Vertical Head Turns: Normal Gait and Pivot Turn: Normal Step Over Obstacle: Mild Impairment Step Around Obstacles: Normal Steps: Mild Impairment  Total Score: 21       Pertinent Vitals/Pain Pain Assessment: No/denies pain Pain  Intervention(s): Monitored during session    Home Living Family/patient expects to be discharged to:: Private residence Living Arrangements: Other relatives (brother) Available Help at Discharge: Family Type of Home: House Home Access: Ramped entrance     Home Layout: One level Home Equipment: Bedside commode;Shower seat;Cane - single point;Crutches;Wheelchair - manual      Prior Function Level of Independence: Independent         Comments: works various odd jobs, 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 no use of AD     Hand Dominance   Dominant Hand: Right    Extremity/Trunk Assessment   Upper Extremity Assessment Upper Extremity Assessment: Overall WFL for tasks assessed    Lower Extremity Assessment Lower Extremity Assessment: Overall WFL for tasks assessed    Cervical / Trunk Assessment Cervical / Trunk Assessment: Normal  Communication   Communication: No difficulties  Cognition Arousal/Alertness: Awake/alert Behavior During Therapy: WFL for tasks assessed/performed;Impulsive Overall Cognitive Status: Impaired/Different from baseline                                 General Comments: difficulty with questions such as "do you feel you are at your baseline for mobility?" pt giving tangential response about being agreeable to whatever therapy is needed. following all commands well      General Comments General comments (skin integrity, edema, etc.): VSS    Exercises     Assessment/Plan    PT Assessment Patient needs continued PT services  PT Problem List Decreased strength;Decreased activity tolerance;Decreased balance;Decreased knowledge of use of DME;Pain;Decreased safety awareness;Decreased mobility       PT Treatment Interventions DME instruction;Therapeutic activities;Gait training;Therapeutic exercise;Patient/family education;Balance training;Stair training;Neuromuscular re-education;Functional mobility training    PT Goals (Current  goals can be found in the Care Plan section)  Acute Rehab PT Goals Patient Stated Goal: I'm a little unsteady on my feet, move better PT Goal Formulation: With patient Time For Goal Achievement: 05/02/20 Potential to Achieve Goals: Good    Frequency Min 3X/week    AM-PAC PT "6 Clicks" Mobility  Outcome Measure Help needed turning from your back to your side while in a flat bed without using bedrails?: None Help needed moving from lying on your back to sitting on the side of a flat bed without using bedrails?: None Help needed moving to and from a bed to a chair (including a wheelchair)?: None Help needed standing up from a chair using your arms (e.g., wheelchair or bedside chair)?: None Help needed to walk in hospital room?: A Little Help needed climbing 3-5 steps with a railing? : A Little 6 Click Score: 22    End of Session Equipment Utilized During Treatment: Gait belt Activity Tolerance: Patient tolerated treatment well;Patient limited by fatigue Patient left: in bed;with call bell/phone within reach Nurse Communication: Mobility status PT Visit Diagnosis: Other abnormalities of gait and mobility (R26.89);Difficulty in walking, not elsewhere classified (R26.2)    Time: 8250-5397 PT Time Calculation (min) (ACUTE ONLY): 41 min   Charges:   PT Evaluation $PT Eval Low Complexity: 1 Low PT Treatments $Gait Training: 23-37 mins        Rolm Baptise, PT, DPT   Acute Rehabilitation Department Pager #: [phone]  Gaetana Michaelis 04/18/2020, 3:03 PM

## 2020-04-18 NOTE — Plan of Care (Signed)
  Problem: Education: Goal: Knowledge of disease and its progression will improve Outcome: Progressing   

## 2020-04-18 NOTE — Progress Notes (Signed)
[facility] Progress Note   Subjective:  Feeling well, working with OT.  Good appetite.  No voiding difficulties with foley out.[name] UOP 3.6L yesterday.  Objective Vitals:   04/17/20 1307 04/17/20 1700 04/17/20 2158 04/18/20 0610  BP: (!) 159/91 (!) 155/84 (!) 149/99 125/88  Pulse: (!) 104 (!) 102 (!) 105 (!) 108  Resp: 18 18 18 16   Temp: 98.1 F (36.7 C) 98.1 F (36.7 C) 98.7 F (37.1 C) 98.2 F (36.8 C)  TempSrc:  Oral Oral Oral  SpO2: 100% 100%  99%  Weight:      Height:       Physical Exam General: calm, awake Heart: RRR Lungs: normal WOB on RA Extremities: no edema GU: foley out  Additional Objective Labs: Basic Metabolic Panel: Recent Labs  Lab 04/16/20 0354 04/17/20 0402 04/18/20 0202  NA 133* 134* 134*  K 4.0 3.5 3.8  CL 101 102 98  CO2 23 23 26   GLUCOSE 133* 226* 277*  BUN 54* 51* 46*  CREATININE 5.01* 3.74* 2.93*  CALCIUM 8.2* 8.2* 8.5*  PHOS 3.8 3.4  --    Liver Function Tests: Recent Labs  Lab 04/11/20 1757 04/14/20 1043 04/17/20 0402  AST 27 27  --   ALT 24 18  --   ALKPHOS 57 56  --   BILITOT 0.9 0.6  --   PROT 7.5 6.4*  --   ALBUMIN 3.8 3.2* 2.5*   Recent Labs  Lab 04/11/20 1757 04/14/20 1043  LIPASE 19 41   CBC: Recent Labs  Lab 04/11/20 1757 04/14/20 1043 04/14/20 1800 04/15/20 1103 04/16/20 0354 04/17/20 0402 04/18/20 0202  WBC 6.5 7.2  --  8.1 7.4 5.6 5.9  NEUTROABS 4.0 5.9  --   --   --   --   --   HGB 15.4 12.6*   < > 12.7* 11.3* 11.3* 11.4*  HCT 45.8 36.9*   < > 36.9* 33.4* 32.5* 32.8*  MCV 85.8 83.3  --  83.9 83.1 81.9 83.5  PLT 307 179  --  183 171 168 184   < > = values in this interval not displayed.   Blood Culture    Component Value Date/Time   SDES BLOOD RIGHT ARM 03/26/2020 1805   SPECREQUEST  03/26/2020 1805    BOTTLES DRAWN AEROBIC ONLY Blood Culture adequate volume   CULT  03/26/2020 1805    NO GROWTH 5 DAYS Performed at [facility], [address] [place], [address]  [place]    REPTSTATUS 03/31/2020 FINAL 03/26/2020 1805    Cardiac Enzymes: Recent Labs  Lab 04/14/20 1600  CKTOTAL 171   CBG: Recent Labs  Lab 04/17/20 0700 04/17/20 1153 04/17/20 1729 04/17/20 2203 04/18/20 0704  GLUCAP 154* 227* 201* 278* 115*   Iron Studies: No results for input(s): IRON, TIBC, TRANSFERRIN, FERRITIN in the last 72 hours. @lablastinr3 @ Studies/Results: No results found. Medications:  . amitriptyline  20 mg Oral QHS  . amLODipine  10 mg Oral Daily  . atorvastatin  20 mg Oral Daily  . Chlorhexidine Gluconate Cloth  6 each Topical Daily  . dicyclomine  20 mg Oral TID AC  . docusate sodium  100 mg Oral BID  . famotidine  20 mg Oral Daily  . feeding supplement  237 mL Oral TID BM  . gabapentin  300 mg Oral QHS  . hydrALAZINE  50 mg Oral Q8H  . insulin aspart  0-9 Units Subcutaneous TID WC  . insulin aspart  5 Units Subcutaneous TID WC  . insulin glargine  20 Units Subcutaneous Q2200  . melatonin  12 mg Oral QHS  . multivitamin with minerals  1 tablet Oral Daily  . pantoprazole  40 mg Oral BID  . sodium chloride flush  3 mL Intravenous Q12H  . sucralfate  1 g Oral BID    Assessment/Plan **AKI, severe: Cr was 1.2 just 3 days prior up to ~5 at admission. Multifactorial with  prerenal with poor po intake, HTN urgency with cocaine, valtrex toxicity.  CT without obstruction.  Improving with hydration, holding valtrex - cr down to 2.9 from 3.74 today and good UOP.  Avoid nephrotoxins and avoid hypotension.   He should resolved to normal.  I don't think nephrology follow up is needed unless he has residual AKI following this episode.  **AMS:  CT w/o acute issue.  Probably multifactorial, ? Valtrex toxicity with dysarthria but also poly substance abuse and Withdraw.  resolved.  Per primary.    **Shingles:  Holding valtrex for renal and neurotoxicity.   **HTN:  Chronic poorly controlled HTN; resumed home meds with hold parameters to avoid drops in BP.   Normotensive today.  **DM: per primary  **EtoH, drug use - cocaine: monitor/treat for withdrawal.  Says has used cocaine everyday for 45 years?  Will sign off, let us know if we can help with anything further.   Estill Bakes MD 04/18/2020, 10:38 AM  [facility] Pager: [phone]

## 2020-04-18 NOTE — Progress Notes (Signed)
PROGRESS NOTE  [name]:[id number] DOB: 1955/08/16 DOA: 04/14/2020 PCP: Priscille Kluver, MD  HPI/Recap of past 24 hours: William Acevedo a 65 y.o.malewith medical history significant ofhypertension, hyperlipidemia,CVA,uncontrolled diabetes mellitus type 2, renal infarct, major depressive disorder, and polysubstance abuse(cocaine and marijuana) presented to the hospital with weakness, nausea vomiting and bright red blood per rectum.  Patient also had abdominal and right chest wall pain.He had just recently been seen in the emergency department 3 days ago with complaints of right chest wall pain and had been diagnosed with herpes zoster.   Patient has history of multifocal brain ischemia and was admitted from 3/17-3/20.  He had been started on aspirin and Pepcid. GI had also been formally consulted during that time due to streaks of blood but recommended outpatient work-up EGD and colonoscopy as patient had never had one before.  This time in the ED, patient was mildly tachycardic.  Hemoglobin was 12.6.  Creatinine was elevated at >5.0. CT scan of the abdomen pelvis significant for trace free pelvic fluid and new bilateral appearing renal stranding without hydronephrosis or hydroureter and punctate nonobstructive renal calculi in the right and possibly in the left kidney and prominent stool throughout colon favoring constipation.Patient was given 500 mL bolus of normal saline IV fluids, morphine, Haldol, and Pepcid IV.  Patient was then admitted to hospital for further evaluation and treatment.  During hospitalization, patient had urinary retention and a Foley catheter was placed in.  Acute kidney injury is improving.  On IV fluids.  Nephrology is following.  04/18/20: Seen and examined at his bedside.  Reports having calluses on his foot.  Also reports pain in his R testicle.  Examined with chaperone NT.  Physical exam is benign.  Will need to follow-up with urology  outpatient.  Assessment/Plan: Principal Problem:   ARF (acute renal failure) (HCC) Active Problems:   Polysubstance abuse (HCC)   Dehydration   Abdominal pain   Acute blood loss anemia   GI bleed   Nausea & vomiting   Acute metabolic encephalopathy   Hypertensive urgency  Improving acute kidney injury.    Possibility of poor oral intake, hypertensive urgency with cocaine usage, Valtrex toxicity or combination of all of this contributing to acute kidney injury. CT scan of the abdomen and Pelvis on 04/14/2020 without any evidence of hydronephrosis.  Bladder scan with urinary retention so a Foley catheter was placed.  Creatinine improving.   Currently on 75 mL of normal saline per hour.. Continue Foley catheter due to urinary retention and significant acute kidney injury.  Continue to monitor intake and output charting.  Follow nephrology recommendation. Creatinine downtrending, 2.9 from 3.7.  Nausea, vomiting, and abdominal pain with GI bleed. Resolved at this time.  Seen by GI.  Stool guaiac was positive.    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  Continue Protonix and Carafate. continue hydration.  Type and cross and transfuse as necessary.  Hemoglobin of 11.3 today.  Uncontrolled diabetes mellitus type 2.  On insulin regimen at this time.  Continue Lantus, sliding scale insulin. Hemoglobin A1c of 10.6 on 3/22.  53  Hyponatremia: likely secondary to volume depletion..  Latest sodium of 134.  Still on some normal saline.  Hypertensive urgency Chronically poorly controlled hypertension.  Exacerbated by cocaine use.  On oral and as needed hydralazine.  Will increase hydralazine to 50 mg 3 times daily from 25 mg 3 times daily.  Losartan on hold due to acute kidney injury.  Added amlodipine  yesterday.  We will continue to monitor closely.  Acute metabolic encephalopathy: Likely multifactorial.  Improved at this time.  At baseline.  History of stroke.  Herpes zoster:  of the  right back and chest wall diagnosed recently.  Was on Valtrex but Valtrex has been discontinued at this time due to AKI.[name]  History of CVA: During the previous admission MRI from 03/25/20 noted multifocal acute ischemia with in both cerebellar hemispheres the right occipital lobe, and both frontal lobes.Patient had undergone a TEE that gave concern for the possibility of possible small density on the mitral valve concerning for possible vegetation and concern for small PFO.Blood cultures during that hospitalization however were noted to be negative.  Aspirin on hold.  Continue statins  Polysubstance abuse: UDS positive for cocaine and marijuana presentation.  Counseling done.  Deconditioning debility.  Will get PT OT evaluation.  Patient might need skilled nursing facility placement  Severe protein calorie malnutrition.  Present on admission.  Continue nutritional supplements.  Seen by dietary during hospitalization.  Scrotal pain, chronic Examined with chaperone, NT Marshall Colbey Benign exam Will need to follow-up with neurology outpatient  DVT prophylaxis: SCDs Start: 04/14/20 1529   Code Status: Full code  Family Communication:  None today.  Status is: Inpatient  Remains inpatient appropriate because:IV treatments appropriate due to intensity of illness or inability to take PO, Inpatient level of care appropriate due to severity of illness and Acute kidney injury, closer monitoring of hemoglobin, urinary retention.  Dispo: The patient is from: Home  Anticipated d/c is to: Home with home health versus skilled nursing facility.  PT OT requested.               Barriers to discharge: include accelerated hypertension, urinary retention Foley catheter, acute kidney injury, possible need for rehabilitation placement.  Patient currently is not medically stable to d/c.              Difficult to place patient  No   Consultants:  GI  Nephrology  Procedures:  Foley catheter  Anti-infectives:   none      Objective: Vitals:   04/17/20 1700 04/17/20 2158 04/18/20 0610 04/18/20 1754  BP: (!) 155/84 (!) 149/99 125/88 (!) 163/87  Pulse: (!) 102 (!) 105 (!) 108 (!) 109  Resp: 18 18 16 18   Temp: 98.1 F (36.7 C) 98.7 F (37.1 C) 98.2 F (36.8 C) 99.1 F (37.3 C)  TempSrc: Oral Oral Oral Oral  SpO2: 100%  99% 100%  Weight:      Height:        Intake/Output Summary (Last 24 hours) at 04/18/2020 1800 Last data filed at 04/18/2020 0200 Gross per 24 hour  Intake --  Output 800 ml  Net -800 ml   Filed Weights   04/14/20 0951 04/14/20 2034  Weight: 56 kg 56 kg    Exam:  . General: 65 y.o. year-old male well developed well nourished in no acute distress.  Alert and oriented x3. . Cardiovascular: Regular rate and rhythm with no rubs or gallops.  No thyromegaly or JVD noted.   71 Respiratory: Clear to auscultation with no wheezes or rales. Good inspiratory effort. . Abdomen: Soft nontender nondistended with normal bowel sounds x4 quadrants. . Musculoskeletal: No lower extremity edema.  . Skin: No ulcerative lesions noted or rashes . Psychiatry: Mood is appropriate for condition and setting   Data Reviewed: CBC: Recent Labs  Lab 04/14/20 1043 04/14/20 1800 04/15/20 1103 04/16/20 0354 04/17/20 0402 04/18/20 0202  WBC 7.2  --  8.1 7.4 5.6 5.9  NEUTROABS 5.9  --   --   --   --   --   HGB 12.6* 12.2* 12.7* 11.3* 11.3* 11.4*  HCT 36.9* 35.1* 36.9* 33.4* 32.5* 32.8*  MCV 83.3  --  83.9 83.1 81.9 83.5  PLT 179  --  183 171 168 184   Basic Metabolic Panel: Recent Labs  Lab 04/14/20 1800 04/15/20 1103 04/16/20 0354 04/17/20 0402 04/18/20 0202  NA 125* 128* 133* 134* 134*  K 4.5 4.6 4.0 3.5 3.8  CL 91* 95* 101 102 98  CO2 24 22 23 23 26   GLUCOSE 424* 328* 133* 226* 277*  BUN 49* 49* 54* 51* 46*  CREATININE 5.24* 5.38* 5.01* 3.74* 2.93*  CALCIUM 8.3* 8.4* 8.2*  8.2* 8.5*  MG  --   --  2.1 1.9 1.8  PHOS  --   --  3.8 3.4  --    GFR: Estimated Creatinine Clearance: 19.6 mL/min (A) (by C-G formula based on SCr of 2.93 mg/dL (H)). Liver Function Tests: Recent Labs  Lab 04/14/20 1043 04/17/20 0402  AST 27  --   ALT 18  --   ALKPHOS 56  --   BILITOT 0.6  --   PROT 6.4*  --   ALBUMIN 3.2* 2.5*   Recent Labs  Lab 04/14/20 1043  LIPASE 41   No results for input(s): AMMONIA in the last 168 hours. Coagulation Profile: No results for input(s): INR, PROTIME in the last 168 hours. Cardiac Enzymes: Recent Labs  Lab 04/14/20 1600  CKTOTAL 171   BNP (last 3 results) No results for input(s): PROBNP in the last 8760 hours. HbA1C: No results for input(s): HGBA1C in the last 72 hours. CBG: Recent Labs  Lab 04/17/20 1729 04/17/20 2203 04/18/20 0704 04/18/20 1142 04/18/20 1715  GLUCAP 201* 278* 115* 220* 231*   Lipid Profile: No results for input(s): CHOL, HDL, LDLCALC, TRIG, CHOLHDL, LDLDIRECT in the last 72 hours. Thyroid Function Tests: No results for input(s): TSH, T4TOTAL, FREET4, T3FREE, THYROIDAB in the last 72 hours. Anemia Panel: No results for input(s): VITAMINB12, FOLATE, FERRITIN, TIBC, IRON, RETICCTPCT in the last 72 hours. Urine analysis:    Component Value Date/Time   COLORURINE STRAW (A) 04/14/2020 1446   APPEARANCEUR CLEAR 04/14/2020 1446   LABSPEC 1.005 04/14/2020 1446   PHURINE 6.0 04/14/2020 1446   GLUCOSEU >=500 (A) 04/14/2020 1446   HGBUR SMALL (A) 04/14/2020 1446   BILIRUBINUR NEGATIVE 04/14/2020 1446   KETONESUR NEGATIVE 04/14/2020 1446   PROTEINUR 30 (A) 04/14/2020 1446   NITRITE NEGATIVE 04/14/2020 1446   LEUKOCYTESUR NEGATIVE 04/14/2020 1446   Sepsis Labs: @LABRCNTIP (procalcitonin:4,lacticidven:4)  ) Recent Results (from the past 240 hour(s))  Resp Panel by RT-PCR (Flu A&B, Covid) Nasopharyngeal Swab     Status: None   Collection Time: 04/14/20  3:12 PM   Specimen: Nasopharyngeal Swab;  Nasopharyngeal(NP) swabs in vial transport medium  Result Value Ref Range Status   SARS Coronavirus 2 by RT PCR NEGATIVE NEGATIVE Final    Comment: (NOTE) SARS-CoV-2 target nucleic acids are NOT DETECTED.  The SARS-CoV-2 RNA is generally detectable in upper respiratory specimens during the acute phase of infection. The lowest concentration of SARS-CoV-2 viral copies this assay can detect is 138 copies/mL. A negative result does not preclude SARS-Cov-2 infection and should not be used as the sole basis for treatment or other patient management decisions. A negative result may occur with  improper specimen collection/handling, submission of  specimen other than nasopharyngeal swab, presence of viral mutation(s) within the areas targeted by this assay, and inadequate number of viral copies(<138 copies/mL). A negative result must be combined with clinical observations, patient history, and epidemiological information. The expected result is Negative.  Fact Sheet for Patients:  BloggerCourse.comhttps://www.fda.gov/media/152166/download  Fact Sheet for Healthcare Providers:  SeriousBroker.ithttps://www.fda.gov/media/152162/download  This test is no t yet approved or cleared by the [place] FDA and  has been authorized for detection and/or diagnosis of SARS-CoV-2 by FDA under an Emergency Use Authorization (EUA). This EUA will remain  in effect (meaning this test can be used) for the duration of the COVID-19 declaration under Section 564(b)(1) of the Act, 21 U.S.C.section 360bbb-3(b)(1), unless the authorization is terminated  or revoked sooner.       Influenza A by PCR NEGATIVE NEGATIVE Final   Influenza B by PCR NEGATIVE NEGATIVE Final    Comment: (NOTE) The Xpert Xpress SARS-CoV-2/FLU/RSV plus assay is intended as an aid in the diagnosis of influenza from Nasopharyngeal swab specimens and should not be used as a sole basis for treatment. Nasal washings and aspirates are unacceptable for Xpert Xpress  SARS-CoV-2/FLU/RSV testing.  Fact Sheet for Patients: BloggerCourse.comhttps://www.fda.gov/media/152166/download  Fact Sheet for Healthcare Providers: SeriousBroker.ithttps://www.fda.gov/media/152162/download  This test is not yet approved or cleared by the [place] FDA and has been authorized for detection and/or diagnosis of SARS-CoV-2 by FDA under an Emergency Use Authorization (EUA). This EUA will remain in effect (meaning this test can be used) for the duration of the COVID-19 declaration under Section 564(b)(1) of the Act, 21 U.S.C. section 360bbb-3(b)(1), unless the authorization is terminated or revoked.  Performed at [facility], [address] [place], [place] [address]       Studies: No results found.  Scheduled Meds: . amitriptyline  20 mg Oral QHS  . amLODipine  10 mg Oral Daily  . atorvastatin  20 mg Oral Daily  . Chlorhexidine Gluconate Cloth  6 each Topical Daily  . dicyclomine  20 mg Oral TID AC  . docusate sodium  100 mg Oral BID  . famotidine  20 mg Oral Daily  . feeding supplement  237 mL Oral TID BM  . gabapentin  300 mg Oral QHS  . hydrALAZINE  50 mg Oral Q8H  . insulin aspart  0-9 Units Subcutaneous TID WC  . insulin aspart  5 Units Subcutaneous TID WC  . insulin glargine  20 Units Subcutaneous Q2200  . melatonin  12 mg Oral QHS  . multivitamin with minerals  1 tablet Oral Daily  . pantoprazole  40 mg Oral BID  . sodium chloride flush  3 mL Intravenous Q12H  . sucralfate  1 g Oral BID    Continuous Infusions:   LOS: 4 days     Darlin Droparole N Ronel Rodeheaver, MD [facility] Pager [phone]  If 7PM-7AM, please contact night-coverage [email or web address] Password [email or web address] 04/18/2020, 6:00 PM

## 2020-04-18 NOTE — Plan of Care (Signed)
?  Problem: Clinical Measurements: ?Goal: Will remain free from infection ?Outcome: Progressing ?  ?

## 2020-04-19 LAB — BASIC METABOLIC PANEL
Anion gap: 11 (ref 5–15)
BUN: 39 mg/dL — ABNORMAL HIGH (ref 8–23)
CO2: 28 mmol/L (ref 22–32)
Calcium: 8.7 mg/dL — ABNORMAL LOW (ref 8.9–10.3)
Chloride: 98 mmol/L (ref 98–111)
Creatinine, Ser: 2.21 mg/dL — ABNORMAL HIGH (ref 0.61–1.24)
GFR, Estimated: 32 mL/min — ABNORMAL LOW (ref >60)
Glucose, Bld: 165 mg/dL — ABNORMAL HIGH (ref 70–99)
Potassium: 3.4 mmol/L — ABNORMAL LOW (ref 3.5–5.1)
Sodium: 137 mmol/L (ref 135–145)

## 2020-04-19 LAB — GLUCOSE, CAPILLARY
Glucose-Capillary: 192 mg/dL — ABNORMAL HIGH (ref 70–99)
Glucose-Capillary: 376 mg/dL — ABNORMAL HIGH (ref 70–99)

## 2020-04-19 MED ORDER — HYDRALAZINE HCL 50 MG PO TABS: 50.0000 mg | ORAL_TABLET | Freq: Three times a day (TID) | ORAL | 0 refills | Status: DC

## 2020-04-19 MED ORDER — PANTOPRAZOLE SODIUM 40 MG PO TBEC: 40.0000 mg | DELAYED_RELEASE_TABLET | Freq: Two times a day (BID) | ORAL | 0 refills | Status: DC

## 2020-04-19 MED ORDER — CALAMINE EX LOTN: TOPICAL_LOTION | CUTANEOUS | 0 refills | Status: DC | PRN

## 2020-04-19 MED ORDER — AMLODIPINE BESYLATE 10 MG PO TABS: 10.0000 mg | ORAL_TABLET | Freq: Every day | ORAL | 0 refills | Status: DC

## 2020-04-19 MED ORDER — ADULT MULTIVITAMIN W/MINERALS CH: 1.0000 | ORAL_TABLET | Freq: Every day | ORAL | 0 refills | Status: AC

## 2020-04-19 MED ORDER — INSULIN GLARGINE 100 UNIT/ML SOLOSTAR PEN: 12.0000 [IU] | PEN_INJECTOR | Freq: Every day | SUBCUTANEOUS | 0 refills | Status: DC

## 2020-04-19 MED ORDER — POTASSIUM CHLORIDE CRYS ER 20 MEQ PO TBCR
40.0000 meq | EXTENDED_RELEASE_TABLET | Freq: Once | ORAL | Status: AC
  Administered 2020-04-19: 40 meq via ORAL
  Filled 2020-04-19: qty 2

## 2020-04-19 MED ORDER — GABAPENTIN 600 MG PO TABS: 300.0000 mg | ORAL_TABLET | Freq: Every day | ORAL | 0 refills | Status: DC

## 2020-04-19 NOTE — Progress Notes (Signed)
DISCHARGE NOTE HOME William Acevedo to be discharged to homeper MD order. Discussed prescriptions and follow up appointments with the patient. Prescriptions given to patient; medication list explained in detail. Patient verbalized understanding.  Skin clean, dry and intact without evidence of skin break down, no evidence of skin tears noted. IV catheter discontinued intact. Site without signs and symptoms of complications. Dressing and pressure applied. Pt denies pain at the site currently. No complaints noted.  Patient free of lines, drains, and wounds.   An After Visit Summary (AVS) was printed and given to the patient. Patient escorted via wheelchair, and discharged home via private auto.  Fisher, Kem Kays, RN

## 2020-04-19 NOTE — Progress Notes (Signed)
Patient's friend brought food for patient, however, patient was advised about his carb modified diet. Patient verbalized understanding but still insisted that staff go to the entrance to get his food. Will continue to monitor.   Donia Guiles, RN.

## 2020-04-19 NOTE — Progress Notes (Signed)
Patient restless and agitated at this time. Patient advised that he wants to take a hot shower, however patient is on telemetry so MD on call notified for order to shower. Patient's vital signs stable and mildly tachycardiac on telemetry. Prn Haldol given. Will continue to monitor.   Donia Guiles, RN.

## 2020-04-19 NOTE — Discharge Instructions (Addendum)
Finding Treatment for Addiction Addiction is a complex disease of the brain that causes an uncontrollable (compulsive) need for:  A substance. This includes alcohol, illegal drugs, or prescription medicines, such as painkillers.  An activity or behavior, such as gambling or shopping. Addiction changes the way your brain works. Because of this change:  The need for the medicine, drug, or activity can become so strong that you think about it all the time.  Getting more and more of your addiction becomes the most important thing to you.  You may find yourself leaving other activities and relationships to pursue your addiction.  You can become physically dependent on a substance.  Your health, behavior, emotions, and relationships can change for the worse. How do I know if I need treatment for addiction? Addiction is a progressive disease. Without treatment, addiction can get worse. Living with addiction puts you at higher risk for injury, poor health, loss of employment, loss of money, and even death. You might need treatment for addiction if:  You have tried to stop or cut down, but you have not succeeded.  You find it annoying that your friends and family are concerned about your use or behavior.  You feel guilty about your use or behavior.  You need a particular substance or activity to start your day or to calm down.  You are running out of money because of your addiction.  You have done something illegal to support your addiction.  Your addiction has caused you: ? Health problems. ? Trouble in school, work, home, or with the police. ? To devote all your time to your addiction, and not to other responsibilities. ? To tell lies in order to hide your problem. What types of treatment are available? There may be options for treatment programs and plans based on your addiction, condition, needs, and preferences. No single treatment is right for everyone.  Treatment programs can  be: ? Outpatient. You live at home and go to work or school, but you go to a clinic for treatment. ? Inpatient. You live and sleep at the program facility during treatment.  Programs may include: ? Medicine. You may need medicine to treat the addiction itself, or to treat anxiety or depression. ? Counseling and behavior therapy. This can help individuals and families behave in healthier ways and relate more effectively. ? Support groups. Confidential group therapy, such as a 12-step program, can help individuals and families during treatment and recovery. ? A combination of education, counseling, and a 12-step, spirituality-based approach.   What should I consider when selecting a treatment program? Think about your individual requirements when selecting a treatment program. Ask about:  The overall approach to treatment. ? Some programs are strictly 12-step programs. Some have a more flexible approach. ? Programs may differ in length of stay, setting, and size. ? Some programs include your family in your treatment plan. Support may be offered to them throughout the treatment process, as well as instructions for them when you are discharged. ? You may continue to receive support after you have left the program.  The types of medical services that are offered. Find out if the program: ? Offers specific treatment for your particular addiction. ? Meets all of your needs, including physical and cultural needs. ? Includes any medicines you might need. ? Offers mental health counseling as part of your treatment. ? Offers the 12-step meetings at the center, or if transport is available for patients to attend meetings at other locations.  The cost and types of insurance that are accepted. ? Some programs are sponsored by the government. They support patients who do not have private insurance. ? If you do not have insurance, or if you choose to attend a program that does not accept your insurance,  call the treatment center. Tell them your financial needs and whether a payment plan can be set up. ? There are also organizations that will help you find the resources for treatment. You can find them online by searching "treatment for addiction."  If the program is certified by the appropriate government agency. Where to find support  Your health care provider can help you to find the right treatment. These discussions are confidential.  The [employer] on Alcoholism and Drug Dependence (NCADD). This group has information about treatment centers and programs for people who have an addiction and for family members. ? Call: 1-800-NCA-CALL (351-106-2015). ? Visit the website: https://www.ncadd.org/  The Substance Abuse and Mental Health Services Administration [facility]). This organization will help you find publicly funded treatment centers, help hotlines, and counseling services near you. ? Call: 1-800-662-HELP ((725)421-1549). ? Visit the website: www.findtreatment.RockToxic.pl  The National Problem Gambling Helpline. This is a 24-hour confidential helpline for gambling addiction. ? Call: 712 273 2384 ? Visit the website: CocoaInvestor.tn In countries outside of the [place] and [place], look in [employer] for contact information for services in your area. Follow these instructions at home:  Find supportive people who will help you stay away from your addiction and stay sober.  Do not use the substance or engage in the activity.  If you have been through treatment: ? Follow your plan. The plan is usually developed by you and your health care provider during treatment. ? Go to meetings with other people in recovery. ? Avoid people, situations, and things that lead you to do the things you are addicted to (triggers). Summary  Addiction changes the way your brain works. These changes cause a desire to repeat and increase the use of the a substance or  behavior.  Addiction is a progressive disease. Without treatment, addiction can get worse. Living with addiction puts you at higher risk for injury, poor health, loss of employment, loss of money, and even death.  There may be options for treatment programs and plans based on your addiction, condition, needs, and preferences. No single treatment is right for everyone.  Your health care provider can help you to find the right treatment. These discussions are confidential. This information is not intended to replace advice given to you by your health care provider. Make sure you discuss any questions you have with your health care provider. Document Revised: 09/26/2018 Document Reviewed: 02/01/2017 Elsevier Patient Education  2021 Elsevier Inc. Cocaine Use Disorder Cocaine use disorder is a condition in which your cocaine use disrupts your daily life. Cocaine belongs to a group of powerful drugs known as stimulants. Other names for cocaine include coke, crack, blow, snow, C, powder, and nose candy. This drug has some medical uses, but these are rare. Cocaine is most often misused because of its effects, which include:  A feeling of extreme pleasure (euphoria).  Alertness.  A high energy level. Cocaine use can affect your relationships and how you do your job. Cocaine use disorder can be dangerous because:  Cocaine increases your blood pressure.  Using cocaine can lead to a heart attack or stroke.  Cocaine use can cause fast or irregular heartbeats and seizures.  Non-medical cocaine use is illegal and can  lead to criminal charges. Cocaine use can be life-threatening. What are the causes? This condition is caused by misusing cocaine. Many people start using cocaine because it makes them feel good or helps them deal with their lives. Over time, they get addicted to it. When they try to stop using it, they feel sick. What increases the risk? The following factors may make you more likely to  develop this condition:  Misusing other drugs.  A family history of misusing drugs.  History of mental illness. What are the signs or symptoms? Symptoms of this condition include:  Using more cocaine than you want to, or using cocaine for longer than you want to. You may crave cocaine, try several times to use less cocaine, or try to control your cocaine use. You may also need more and more cocaine to get the same effect that you want (build up a tolerance).  Spending a lot of time getting cocaine, using it, or recovering from its effects.  Having problems at work, at school, at home, or with relationships because of cocaine use.  Giving up or cutting down on important life activities because of cocaine use.  Using cocaine when it is dangerous, such as when driving a car.  Continuing to use cocaine even though it has led to a physical problem, such as poor nutrition, nosebleeds, and chest pain.  Continuing to use cocaine even though it is affecting your mental health. You may develop: ? Schizophrenia-like symptoms. You may think or act in an unusual way. ? Depression. ? Bipolar mood swings. ? Anxiety. ? Sleep problems. If you stop using cocaine, you may have symptoms of withdrawal. Symptoms include depression, sleep problems, and bad dreams. How is this diagnosed? This condition is diagnosed with an assessment. During the assessment, your health care provider will ask about your cocaine use and about how it affects your life. Your health care provider may also:  Do a physical exam or lab tests to see if you have a physical problem resulting from cocaine use.  Screen for drug use.  Refer you to a mental health professional for evaluation. How is this treated? Treatment for this condition is usually provided by mental health professionals with training in substance use disorders. Treatment may involve:  Counseling. This treatment is also called talk therapy. It is provided by  substance use treatment counselors. A counselor can address the reasons you use cocaine and suggest ways to keep you from using it again. The goals of talk therapy are to: ? Find healthy activities to replace using cocaine. ? Identify and avoid what triggers your cocaine use. ? Help you learn how to handle cravings.  Support groups. Support groups are run by people who have quit using addictive substances. They provide emotional support, advice, and guidance.  Medicines.  Care at a residential treatment center.   Follow these instructions at home: Medicines  Take over-the-counter and prescription medicines only as told by your health care provider.  Check with your health care provider before starting any new medicines, herbs, or supplements. General instructions  Do not use any drugs or alcohol.  Avoid people and activities that trigger your use of cocaine.  Learn and practice techniques for managing stress.  Have a plan for vulnerable moments. Get phone numbers of those who are willing to help and who are committed to your recovery.  Attend support groups regularly for emotional support, advice, and guidance.  Do not use any products that contain nicotine or tobacco,  such as cigarettes, e-cigarettes, and chewing tobacco. If you need help quitting, ask your health care provider.  Keep all follow-up visits as told by your health care provider. This is important. This includes work with therapists and support groups.      Where to find more information  [employer] on Drug Abuse: http://www.price-smith.com/  Substance Abuse and Mental Health Services Administration: SkateOasis.com.pt  Narcotics Anonymous: www.na.org Contact a health care provider if:  You cannot take your medicines as told.  You use cocaine again.  Your symptoms get worse. Get help right away if you have:  Serious thoughts about hurting yourself or others.  Chest pain.  Any symptoms of a stroke. "BE  FAST" is an easy way to remember the main warning signs of a stroke: ? B - Balance. Signs are dizziness, sudden trouble walking, or loss of balance. ? E - Eyes. Signs are trouble seeing or a sudden change in vision. ? F - Face. Signs are sudden weakness or numbness of the face, or the face or eyelid drooping on one side. ? A - Arms. Signs are weakness or numbness in an arm. This happens suddenly and usually on one side of the body. ? S - Speech. Signs are sudden trouble speaking, slurred speech, or trouble understanding what people say. ? T - Time. Time to call emergency services. Write down what time symptoms started.  Other signs of a stroke, such as: ? A sudden, severe headache with no known cause. ? Nausea or vomiting. ? Seizure. These symptoms may represent a serious problem that is an emergency. Do not wait to see if the symptoms will go away. Get medical help right away. Call your local emergency services (911 in the [place] Do not drive yourself to the hospital. If you ever feel like you may hurt yourself or others, or have thoughts about taking your own life, get help right away. Go to your nearest emergency department or:  Call your local emergency services (911 in the [place]  Call a suicide crisis helpline, such as the National Suicide Prevention Lifeline at 831-026-3404. This is open 24 hours a day in the [place]  Text the Crisis Text Line at (860)782-3945 (in the [place] Summary  Cocaine has some medical uses, but these are rare. Cocaine is most often misused because of its effects.  Many people start using cocaine because it makes them feel good or helps them deal with their lives. Over time, they get addicted to it.  Treatment for this condition is usually provided by mental health professionals with training in substance use disorders. This information is not intended to replace advice given to you by your health care provider. Make sure you discuss any questions you have with your  health care provider. Document Revised: 10/31/2018 Document Reviewed: 10/31/2018 Elsevier Patient Education  2021 Elsevier Inc. Acute Kidney Injury, Adult  Acute kidney injury is a sudden worsening of kidney function. The kidneys are organs that have several jobs. They filter the blood to remove waste products and extra fluid. They also maintain a healthy balance of minerals and hormones in the body, which helps control blood pressure and keep bones strong. With this condition, your kidneys do not do their jobs as well as they should. This condition ranges from mild to severe. Over time, it may develop into long-lasting (chronic) kidney disease. Early detection and treatment may prevent acute kidney injury from developing into a chronic condition. What are the causes? Common causes of this condition  include:  A problem with blood flow to the kidneys. This may be caused by: ? Low blood pressure (hypotension) or shock. ? Blood loss. ? Heart and blood vessel (cardiovascular) disease. ? Severe burns. ? Liver disease.  Direct damage to the kidneys. This may be caused by: ? Certain medicines. ? A kidney infection. ? Poisoning. ? Being around or in contact with toxic substances. ? A surgical wound. ? A hard, direct hit to the kidney area.  A sudden blockage of urine flow. This may be caused by: ? Cancer. ? Kidney stones. ? An enlarged prostate in males. What increases the risk? You are more likely to develop this condition if you:  Are older than age 52.  Are male.  Are hospitalized, especially if you are in critical condition.  Have certain conditions, such as: ? Chronic kidney disease. ? Diabetes. ? Coronary artery disease and heart failure. ? Pulmonary disease. ? Chronic liver disease. What are the signs or symptoms? Symptoms of this condition may not be obvious until the condition becomes severe. Symptoms of this condition can include:  Tiredness (lethargy) or  difficulty staying awake.  Nausea or vomiting.  Swelling (edema) of the face, legs, ankles, or feet.  Problems with urination, such as: ? Pain in the abdomen, or pain along the side of your stomach (flank). ? Producing little or no urine. ? Passing urine with a weak flow.  Muscle twitches and cramps, especially in the legs.  Confusion or trouble concentrating.  Loss of appetite.  Fever. How is this diagnosed? Your health care provider can diagnose this condition based on your symptoms, medical history, and a physical exam.  You may also have other tests, such as:  Blood tests.  Urine tests.  Imaging tests.  A test in which a sample of tissue is removed from the kidneys to be examined under a microscope (kidney biopsy). How is this treated? Treatment for this condition depends on the cause and how severe the condition is. In mild cases, treatment may not be needed. The kidneys may heal on their own. In more severe cases, treatment will involve:  Treating the cause of the kidney injury. This may involve changing any medicines you are taking or adjusting your dosage.  Fluids. You may need specialized IV fluids to balance your body's needs.  Having a catheter placed to drain urine and prevent blockages.  Preventing problems from occurring. This may mean avoiding certain medicines or procedures that can cause further injury to the kidneys. In some cases, treatment may also require:  A procedure to remove toxic wastes from the body (dialysis or continuous renal replacement therapy, CRRT).  Surgery. This may be done to repair a torn kidney or to remove the blockage from the urinary system. Follow these instructions at home: Medicines  Take over-the-counter and prescription medicines only as told by your health care provider.  Do not take any new medicines without your health care provider's approval. Many medicines can worsen your kidney damage.  Do not take any vitamin  and mineral supplements without your health care provider's approval. Many nutritional supplements can worsen your kidney damage. Lifestyle  If your health care provider prescribed changes to your diet, follow them. You may need to decrease the amount of protein you eat.  Achieve and maintain a healthy weight. If you need help with this, ask your health care provider.  Start or continue an exercise plan. Try to exercise at least 30 minutes a day, 5 days a  week.  Do not use any products that contain nicotine or tobacco, such as cigarettes, e-cigarettes, and chewing tobacco. If you need help quitting, ask your health care provider.   General instructions  Keep track of your blood pressure. Report changes in your blood pressure as told by your health care provider.  Stay up to date with your vaccines. Ask your health care provider which vaccines you need.  Keep all follow-up visits as told by your health care provider. This is important.   Where to find more information  American Association of Kidney Patients: ResidentialShow.iswww.aakp.org  [employer]: www.kidney.org  American Kidney Fund: FightingMatch.com.eewww.akfinc.org  Life Options Rehabilitation Program: ? www.lifeoptions.org ? www.kidneyschool.org Contact a health care provider if:  Your symptoms get worse.  You develop new symptoms. Get help right away if:  You develop symptoms of worsening kidney disease, which include: ? Headaches. ? Abnormally dark or light skin. ? Easy bruising. ? Frequent hiccups. ? Chest pain. ? Shortness of breath. ? End of menstruation in women. ? Seizures. ? Confusion or altered mental status. ? Abdominal or back pain. ? Itchiness.  You have a fever.  Your body is producing less urine.  You have pain or bleeding when you urinate. Summary  Acute kidney injury is a sudden worsening of kidney function.  Acute kidney injury can be caused by problems with blood flow to the kidneys, direct damage to the  kidneys, and sudden blockage of urine flow.  Symptoms of this condition may not be obvious until it becomes severe. Symptoms may include edema, lethargy, confusion, nausea or vomiting, and problems passing urine.  This condition can be diagnosed with blood tests, urine tests, and imaging tests. Sometimes a kidney biopsy is done to diagnose this condition.  Treatment for this condition often involves treating the underlying cause. It is treated with fluids, medicines, diet changes, dialysis, or surgery. This information is not intended to replace advice given to you by your health care provider. Make sure you discuss any questions you have with your health care provider. Document Revised: 11/13/2018 Document Reviewed: 11/13/2018 Elsevier Patient Education  2021 [employer]

## 2020-04-19 NOTE — Discharge Summary (Signed)
Discharge Summary  William Acevedo RCV:[id number] DOB: 10-05-1955  PCP: Olegario Messier, MD  Admit date: 04/14/2020 Discharge date: 04/19/2020  Time spent: 35 minutes.  Recommendations for Outpatient Follow-up:  1. Follow-up with nephrology in 1 to 2 weeks 2. Follow-up with GI in 1 to 2 weeks 3. Follow-up with your primary care provider in 1 to 2 weeks. 4. Completely abstain from polysubstance use 5. Take your medications as prescribed.  Discharge Diagnoses:  Active Hospital Problems   Diagnosis Date Noted  . ARF (acute renal failure) ([place]) 04/14/2020  . Acute blood loss anemia 04/14/2020  . GI bleed 04/14/2020  . Nausea & vomiting 04/14/2020  . Acute metabolic encephalopathy 11/10/8525  . Hypertensive urgency 04/14/2020  . Abdominal pain 04/02/2020  . Polysubstance abuse ([place]) 07/06/2015  . Dehydration     Resolved Hospital Problems  No resolved problems to display.    Discharge Condition: Stable.  Diet recommendation: Heart healthy carb modified diet.  Vitals:   04/18/20 2144 04/19/20 0542  BP: (!) 169/87 137/81  Pulse: (!) 105 (!) 101  Resp: 20 18  Temp: 98.5 F (36.9 C) 98.3 F (36.8 C)  SpO2: 100% 99%    History of present illness:  William C Barksdaleis a 65 y.o.malewith medical history significant ofhypertension, hyperlipidemia,CVA,uncontrolled diabetes mellitus type 2, renal infarct, major depressive disorder, and polysubstance abuse(cocaine and marijuana) presented to the hospital with weakness, nausea vomiting and bright red blood per rectum. Patient also had abdominal and right chest wall pain.He was recently seen in the emergency department 3 days pior with complaints of right chest wall pain and had been diagnosed with herpes zoster.Patient was recently admitted from 3/17-3/20. He had been started on aspirin and Pepcid.  GI was consulted.  Hemoglobin was 12.6. GI recommended outpatient follow-up for possible colonoscopy and EGD.   Presented with creatinine >5.0. CT scan of the abdomen pelvis revealed trace free pelvic fluid and new bilateral appearing renal stranding without hydronephrosis or hydroureter and punctate nonobstructive renal calculi in the right and possibly in the left kidney and prominent stool throughout colon favoring constipation.Patient was admitted to the hospital for further evaluation and treatment.  During hospitalization, patient had an acute urinary retention and a Foley catheter was placed in, then later removed. Was seen by nephrology.  Acute kidney injury improved with IV fluid hydration.   Reported pain in his R testicle on 04/18/20.  Examined with chaperone NT.  Exam was benign.  Will need to follow-up with his primary care provider and obtain urology referral from his primary care provider.   04/19/20:  Seen and examined at his bedside.  He has no new complaints.  There were no acute events overnight.  He is eager to go home.     Hospital Course:  Principal Problem:   ARF (acute renal failure) (H[facility]) Active Problems:   Polysubstance abuse ([facility])   Dehydration   Abdominal pain   Acute blood loss anemia   GI bleed   Nausea & vomiting   Acute metabolic encephalopathy   Hypertensive urgency  Improving acute kidney injury likely prerenal in the setting of poor oral intake and dehydration. Valtrex toxicity contributing to acute kidney injury. CT scan of the abdomen and Pelvis on 04/14/2020 without any evidence of hydronephrosis. Bladder scan with urinary retention so a Foley catheter was placed, and then later removed.  Of IV fluid. Creatinine downtrending, 2.21 from 5.38 on presentation. Nephrology has signed off. Follow-up with nephrology outpatient.  Resolved nausea, vomiting, and  abdominal pain with GI bleed. Symptomatology has resolved. Patient advised to follow-up with GI outpatient, he understands and agrees with plan. Presented with positive stool guaiacLikely  secondary to Mallory-Weiss syndrome.  Continue p.o. Protonix 40 mg twice daily as recommended by GI and sucralfate.   Hemoglobin uptrending 11.4 from 11.3. No overt bleeding. Follow-up with GI.  Uncontrolled diabetes mellitus type 2.  Hemoglobin A1c 10.6 on 04/14/2020. Resume home regimen. Lantus home dose has been decreased from 20 units twice daily to 12 units daily to avoid hypoglycemia.. Follow-up with your PCP.  Resolved hyponatremia:likely secondary to poor oral intake  Latest sodium 137 from 134.  Encourage oral intake.  Resolved hypertensive urgency BP is currently at goal 137/81 Likely uncontrolled BP was exacerbated by cocaine use. He is currently on Norvasc 10 mg daily, p.o. hydralazine 50 mg 3 times daily Follow-up with your PCP.  Resolved acute metabolic encephalopathy: Likely multifactorial.  He is back to his baseline mentation alert and oriented x4.    History of stroke. Continue home Lipitor Completely abstain from cocaine use.  Herpes zoster: of the right back and chest walldiagnosed recently. Was on Valtrex but Valtrex has been discontinued at this time due to AKI. Follow-up with your PCP  History of CVA: During the previous admissionMRI from 03/25/20 noted multifocal acute ischemia with in both cerebellar hemispheres the right occipital lobe, and both frontal lobes.Patient had undergone a TEE that gave concern for the possibility of possible small density on the mitral valve concerning for possible vegetation and concern for small PFO.Blood cultures during that hospitalization however were noted to be negative. Aspirin on hold. Continue statins  Polysubstance abuse:  UDS positive for cocaine andopiates on 04/14/2020.   Polysubstance cessation counseled on at bedside.  Deconditioning debility.  PT OT assessment recommended home health PT OT.   Continue fall precautions.    Severe protein calorie malnutrition.  Continue to  increase oral protein calorie intake.  Resolved scrotal pain, chronic Examined with chaperone, NT Matt Holmes The exam was benign, no edema, no erythema, no evidence of torsion. Follow-up with your primary care provider if it recurs, obtain referral to urology from your primary care provider.     Code Status:Full code     Consultants:  GI  Nephrology  Procedures:  Foley catheter  Anti-infectives:   none    Discharge Exam: BP 137/81 (BP Location: Left Arm)   Pulse (!) 101   Temp 98.3 F (36.8 C) (Oral)   Resp 18   Ht _0  (1.676 m)   Wt 56 kg   SpO2 99%   BMI 19.93 kg/m  . General: 65 y.o. year-old male well developed well nourished in no acute distress.  Alert and oriented x3. . Cardiovascular: Regular rate and rhythm with no rubs or gallops.  No thyromegaly or JVD noted.   [name] Respiratory: Clear to auscultation with no wheezes or rales. Good inspiratory effort. . Abdomen: Soft nontender nondistended with normal bowel sounds x4 quadrants. . Musculoskeletal: No lower extremity edema. 2/4 pulses in all 4 extremities. . Skin: No ulcerative lesions noted or rashes, . Psychiatry: Mood is appropriate for condition and setting  Discharge Instructions You were cared for by a hospitalist during your hospital stay. If you have any questions about your discharge medications or the care you received while you were in the hospital after you are discharged, you can call the unit and asked to speak with the hospitalist on call if the hospitalist that took care of  you is not available. Once you are discharged, your primary care physician will handle any further medical issues. Please note that NO REFILLS for any discharge medications will be authorized once you are discharged, as it is imperative that you return to your primary care physician (or establish a relationship with a primary care physician if you do not have one) for your aftercare needs so that they can  reassess your need for medications and monitor your lab values.   Allergies as of 04/19/2020      Reactions   Trazodone    Other reaction(s): Priapism   Lisinopril Swelling   angioedema   Omeprazole    Other reaction(s): ANGIOEDEMA OF LIPS, Angioedema of tongue.      Medication List    STOP taking these medications   losartan 25 MG tablet Commonly known as: COZAAR   metoprolol tartrate 50 MG tablet Commonly known as: LOPRESSOR   valACYclovir 1000 MG tablet Commonly known as: VALTREX     TAKE these medications   acetaminophen 325 MG tablet Commonly known as: TYLENOL Take 2 tablets (650 mg total) by mouth every 6 (six) hours as needed for mild pain (or Fever >/= 101).   amitriptyline 10 MG tablet Commonly known as: ELAVIL Take 20 mg by mouth at bedtime.   amLODipine 10 MG tablet Commonly known as: NORVASC Take 1 tablet (10 mg total) by mouth daily. Start taking on: April 20, 2020   aspirin 81 MG EC tablet Take 1 tablet (81 mg total) by mouth daily. Swallow whole.   atorvastatin 20 MG tablet Commonly known as: LIPITOR Take 20 mg by mouth daily.   blood glucose meter kit and supplies Kit Dispense based on patient and insurance preference. Use up to four times daily as directed. (FOR ICD-9 250.00, 250.01).   calamine lotion Apply topically as needed for itching.   cyclobenzaprine 5 MG tablet Commonly known as: FLEXERIL Take 1 tablet (5 mg total) by mouth 3 (three) times daily as needed for muscle spasms.   dicyclomine 20 MG tablet Commonly known as: BENTYL Take 1 tablet (20 mg total) by mouth 3 (three) times daily before meals.   docusate sodium 100 MG capsule Commonly known as: COLACE Take 1 capsule (100 mg total) by mouth 2 (two) times daily.   empagliflozin 25 MG Tabs tablet Commonly known as: JARDIANCE Take 25 mg by mouth daily.   famotidine 20 MG tablet Commonly known as: Pepcid Take 1 tablet (20 mg total) by mouth daily.   feeding supplement  Liqd Take 237 mLs by mouth 3 (three) times daily between meals.   gabapentin 600 MG tablet Commonly known as: NEURONTIN Take 0.5 tablets (300 mg total) by mouth at bedtime. What changed: how much to take   glipiZIDE 10 MG tablet Commonly known as: GLUCOTROL Take 1 tablet (10 mg total) by mouth daily before breakfast. What changed: when to take this   hydrALAZINE 50 MG tablet Commonly known as: APRESOLINE Take 1 tablet (50 mg total) by mouth every 8 (eight) hours.   HYDROmorphone 2 MG tablet Commonly known as: Dilaudid Take 1 tablet (2 mg total) by mouth every 4 (four) hours as needed for severe pain.   insulin glargine 100 UNIT/ML Solostar Pen Commonly known as: LANTUS Inject 12 Units into the skin daily. What changed: how much to take   Insulin Pen Needle 29G X 12MM Misc Per instructions   melatonin 3 MG Tabs tablet Take 12 mg by mouth at bedtime.  multivitamin with minerals Tabs tablet Take 1 tablet by mouth daily. Start taking on: April 20, 2020   NovoLOG FlexPen 100 UNIT/ML FlexPen Generic drug: insulin aspart Inject 5 Units into the skin 3 (three) times daily with meals.   ondansetron 4 MG disintegrating tablet Commonly known as: Zofran ODT Take 1 tablet (4 mg total) by mouth every 8 (eight) hours as needed for nausea or vomiting.   pantoprazole 40 MG tablet Commonly known as: PROTONIX Take 1 tablet (40 mg total) by mouth 2 (two) times daily.   polyethylene glycol 17 g packet Commonly known as: MIRALAX / GLYCOLAX Take 17 g by mouth daily as needed for moderate constipation.   sucralfate 1 g tablet Commonly known as: Carafate Take 1 tablet (1 g total) by mouth 2 (two) times daily.   tadalafil 10 MG tablet Commonly known as: CIALIS Take 20 mg by mouth daily as needed for erectile dysfunction.      Allergies  Allergen Reactions  . Trazodone     Other reaction(s): Priapism  . Lisinopril Swelling    angioedema  . Omeprazole     Other reaction(s):  ANGIOEDEMA OF LIPS, Angioedema of tongue.    Follow-up Information    Olegario Messier, MD. Call in 1 day(s).   Specialty: Hematology Why: Please call for a post hospital follow-up appointment. Contact information: [place] [address] [phone]        Justin Mend, MD. Call.   Specialty: Internal Medicine Why: Please call for a post hospital follow-up appointment. Contact information: [place] [address] [phone]        Ronnette Juniper, MD. Call.   Specialty: Gastroenterology Why: Please call for a post hospital follow-up appointment. Contact information: [place] [address] [phone]                The results of significant diagnostics from this hospitalization (including imaging, microbiology, ancillary and laboratory) are listed below for reference.    Significant Diagnostic Studies: CT ABDOMEN PELVIS WO CONTRAST  Result Date: 04/14/2020 CLINICAL DATA:  Acute abdominal pain.  New renal failure. EXAM: CT ABDOMEN AND PELVIS WITHOUT CONTRAST TECHNIQUE: Multidetector CT imaging of the abdomen and pelvis was performed following the standard protocol without IV contrast. COMPARISON:  04/02/2020 FINDINGS: Lower chest: Right coronary artery and descending thoracic aortic atherosclerotic calcification. Calcification along the mitral valve. No overt cardiomegaly. Minimal dependent subsegmental atelectasis in both lower lobes. Hepatobiliary: Unremarkable Pancreas: Unremarkable Spleen: Unremarkable Adrenals/Urinary Tract: Both adrenal glands appear normal. Five punctate calcifications suspicious for nonobstructive renal calculi in the right kidney, measuring up to 0.3 cm in diameter. Questionable punctate 1-2 mm left mid to lower renal calculi. Faint bilateral perirenal stranding appears increased from prior. Small hypodense lesion in the right mid kidney appears stable. No hydronephrosis or hydroureter.  The urinary bladder appears unremarkable. Stomach/Bowel: Prominent stool throughout the colon favors constipation. No dilated small bowel identified. The appendix is indistinct. Paucity of intra-adipose tissue making separation of loops problematic. Vascular/Lymphatic: Dense aortoiliac atherosclerotic vascular calcification noted. Reproductive: Unremarkable Other: Trace free pelvic fluid for example eccentric to the right on image 65 series 3, increased from 04/02/2020. Cannot exclude low-grade mesenteric edema given the paucity of intra-adipose tissue. Musculoskeletal: Mild bilateral foraminal stenosis at L4-5 due to disc bulge and facet arthropathy. IMPRESSION: 1. New trace free pelvic fluid in new mild bilateral perirenal stranding without hydronephrosis or hydroureter. 2. Punctate nonobstructive renal calculi on the right  and possibly the left. 3. Nonvisualization of the appendix on today's exam. 4.  Prominent stool throughout the colon favors constipation. 5. Other imaging findings of potential clinical significance: Aortic Atherosclerosis (ICD10-I70.0). Coronary atherosclerosis. Mitral valve calcification. Mild bilateral foraminal impingement at L4-5. Electronically Signed   By: Van Clines M.D.   On: 04/14/2020 14:59   CT ANGIO HEAD W OR WO CONTRAST  Result Date: 03/27/2020 CLINICAL DATA:  Stroke follow-up EXAM: CT ANGIOGRAPHY HEAD AND NECK TECHNIQUE: Multidetector CT imaging of the head and neck was performed using the standard protocol during bolus administration of intravenous contrast. Multiplanar CT image reconstructions and MIPs were obtained to evaluate the vascular anatomy. Carotid stenosis measurements (when applicable) are obtained utilizing NASCET criteria, using the distal internal carotid diameter as the denominator. CONTRAST:  16m OMNIPAQUE IOHEXOL 350 MG/ML SOLN COMPARISON:  Brain MRI 03/25/2020 FINDINGS: CT HEAD FINDINGS Brain: There is no mass, hemorrhage or extra-axial  collection. The size and configuration of the ventricles and extra-axial CSF spaces are normal. Old left occipital lobe infarct. Infarcts characterized on MRI are not clearly visible on CT. Skull: The visualized skull base, calvarium and extracranial soft tissues are normal. Sinuses/Orbits: No fluid levels or advanced mucosal thickening of the visualized paranasal sinuses. No mastoid or middle ear effusion. The orbits are normal. CTA NECK FINDINGS SKELETON: There is no bony spinal canal stenosis. No lytic or blastic lesion. OTHER NECK: Normal pharynx, larynx and major salivary glands. No cervical lymphadenopathy. Unremarkable thyroid gland. UPPER CHEST: No pneumothorax or pleural effusion. No nodules or masses. AORTIC ARCH: There is calcific atherosclerosis of the aortic arch. There is no aneurysm, dissection or hemodynamically significant stenosis of the visualized portion of the aorta. Conventional 3 vessel aortic branching pattern. The visualized proximal subclavian arteries are widely patent. RIGHT CAROTID SYSTEM: No dissection, occlusion or aneurysm. There is mixed density atherosclerosis extending into the proximal ICA, resulting in less than 50% stenosis. LEFT CAROTID SYSTEM: No dissection, occlusion or aneurysm. There is mixed density atherosclerosis extending into the proximal ICA, resulting in less than 50% stenosis. VERTEBRAL ARTERIES: Left dominant configuration. Both origins are clearly patent. There is no dissection, occlusion or flow-limiting stenosis to the skull base (V1-V3 segments). CTA HEAD FINDINGS POSTERIOR CIRCULATION: --Vertebral arteries: Normal V4 segments. --Inferior cerebellar arteries: Normal. --Basilar artery: Normal. --Superior cerebellar arteries: Normal. --Posterior cerebral arteries (PCA): Normal. ANTERIOR CIRCULATION: --Intracranial internal carotid arteries: Greater than 50% stenosis of the proximal cavernous segment of the left ICA. Approximately 50% stenosis of the mid cavernous  right ICA. --Anterior cerebral arteries (ACA): Normal. Hypoplastic right A1 segment, normal variant. --Middle cerebral arteries (MCA): Normal. VENOUS SINUSES: As permitted by contrast timing, patent. ANATOMIC VARIANTS: None Review of the MIP images confirms the above findings. IMPRESSION: 1. No emergent large vessel occlusion. 2. Greater than 50% stenosis of the proximal cavernous segment of the left ICA. 3. Approximately 50% stenosis of the mid cavernous right ICA. Bilateral carotid bifurcation atherosclerosis without hemodynamically significant stenosis by NASCET criteria. Aortic Atherosclerosis (ICD10-I70.0). Electronically Signed   By: KUlyses JarredM.D.   On: 03/27/2020 19:23   CT Head Wo Contrast  Result Date: 04/14/2020 CLINICAL DATA:  Altered mental status. EXAM: CT HEAD WITHOUT CONTRAST TECHNIQUE: Contiguous axial images were obtained from the base of the skull through the vertex without intravenous contrast. COMPARISON:  March 25, 2020. FINDINGS: Brain: Mild chronic ischemic white matter disease is noted. Old left occipital infarction is noted. No mass effect or midline shift is noted. Ventricular size is within  normal limits. There is no evidence of mass lesion, hemorrhage or acute infarction. Vascular: No hyperdense vessel or unexpected calcification. Skull: Normal. Negative for fracture or focal lesion. Sinuses/Orbits: No acute finding. Other: None. IMPRESSION: Mild chronic ischemic white matter disease. Old left occipital infarction. No acute intracranial abnormality seen. Electronically Signed   By: Marijo Conception M.D.   On: 04/14/2020 12:40   CT Head Wo Contrast  Result Date: 03/25/2020 CLINICAL DATA:  Right-sided weakness. EXAM: CT HEAD WITHOUT CONTRAST TECHNIQUE: Contiguous axial images were obtained from the base of the skull through the vertex without intravenous contrast. COMPARISON:  April 06, 2019 and January 18, 2019 FINDINGS: Brain: No evidence of acute infarction, hemorrhage,  hydrocephalus, extra-axial collection or mass lesion/mass effect. A small, stable area of white matter low attenuation is seen within the left occipital lobe. There is no evidence of associated mass effect or midline shift. While this is stable in appearance when compared to the prior plain brain CT, dated April 06, 2019, it does represent a new finding when compared to the earlier exam from January 18, 2019. Vascular: No hyperdense vessel or unexpected calcification. Skull: A chronic left-sided nasal bone fracture is seen. Sinuses/Orbits: No acute finding. Other: Mild left posterior parietal scalp soft tissue swelling is seen. IMPRESSION: 1. Findings suggestive of a small, stable chronic left occipital lobe infarct. MRI correlation is recommended. 2. No acute intracranial abnormality. Electronically Signed   By: Virgina Norfolk M.D.   On: 03/25/2020 19:27   CT ANGIO NECK W OR WO CONTRAST  Result Date: 03/27/2020 CLINICAL DATA:  Stroke follow-up EXAM: CT ANGIOGRAPHY HEAD AND NECK TECHNIQUE: Multidetector CT imaging of the head and neck was performed using the standard protocol during bolus administration of intravenous contrast. Multiplanar CT image reconstructions and MIPs were obtained to evaluate the vascular anatomy. Carotid stenosis measurements (when applicable) are obtained utilizing NASCET criteria, using the distal internal carotid diameter as the denominator. CONTRAST:  56m OMNIPAQUE IOHEXOL 350 MG/ML SOLN COMPARISON:  Brain MRI 03/25/2020 FINDINGS: CT HEAD FINDINGS Brain: There is no mass, hemorrhage or extra-axial collection. The size and configuration of the ventricles and extra-axial CSF spaces are normal. Old left occipital lobe infarct. Infarcts characterized on MRI are not clearly visible on CT. Skull: The visualized skull base, calvarium and extracranial soft tissues are normal. Sinuses/Orbits: No fluid levels or advanced mucosal thickening of the visualized paranasal sinuses. No mastoid or  middle ear effusion. The orbits are normal. CTA NECK FINDINGS SKELETON: There is no bony spinal canal stenosis. No lytic or blastic lesion. OTHER NECK: Normal pharynx, larynx and major salivary glands. No cervical lymphadenopathy. Unremarkable thyroid gland. UPPER CHEST: No pneumothorax or pleural effusion. No nodules or masses. AORTIC ARCH: There is calcific atherosclerosis of the aortic arch. There is no aneurysm, dissection or hemodynamically significant stenosis of the visualized portion of the aorta. Conventional 3 vessel aortic branching pattern. The visualized proximal subclavian arteries are widely patent. RIGHT CAROTID SYSTEM: No dissection, occlusion or aneurysm. There is mixed density atherosclerosis extending into the proximal ICA, resulting in less than 50% stenosis. LEFT CAROTID SYSTEM: No dissection, occlusion or aneurysm. There is mixed density atherosclerosis extending into the proximal ICA, resulting in less than 50% stenosis. VERTEBRAL ARTERIES: Left dominant configuration. Both origins are clearly patent. There is no dissection, occlusion or flow-limiting stenosis to the skull base (V1-V3 segments). CTA HEAD FINDINGS POSTERIOR CIRCULATION: --Vertebral arteries: Normal V4 segments. --Inferior cerebellar arteries: Normal. --Basilar artery: Normal. --Superior cerebellar arteries: Normal. --Posterior cerebral  arteries (PCA): Normal. ANTERIOR CIRCULATION: --Intracranial internal carotid arteries: Greater than 50% stenosis of the proximal cavernous segment of the left ICA. Approximately 50% stenosis of the mid cavernous right ICA. --Anterior cerebral arteries (ACA): Normal. Hypoplastic right A1 segment, normal variant. --Middle cerebral arteries (MCA): Normal. VENOUS SINUSES: As permitted by contrast timing, patent. ANATOMIC VARIANTS: None Review of the MIP images confirms the above findings. IMPRESSION: 1. No emergent large vessel occlusion. 2. Greater than 50% stenosis of the proximal cavernous  segment of the left ICA. 3. Approximately 50% stenosis of the mid cavernous right ICA. Bilateral carotid bifurcation atherosclerosis without hemodynamically significant stenosis by NASCET criteria. Aortic Atherosclerosis (ICD10-I70.0). Electronically Signed   By: Ulyses Jarred M.D.   On: 03/27/2020 19:23   MR BRAIN WO CONTRAST  Result Date: 03/25/2020 CLINICAL DATA:  Right arm weakness EXAM: MRI HEAD WITHOUT CONTRAST TECHNIQUE: Multiplanar, multiecho pulse sequences of the brain and surrounding structures were obtained without intravenous contrast. COMPARISON:  Head CT 03/25/2020 FINDINGS: Brain: There is multifocal abnormal diffusion restriction within both cerebellar hemispheres, the right occipital lobe and both frontal lobes. No acute or chronic hemorrhage. There is multifocal hyperintense T2-weighted signal within the white matter. Parenchymal volume and CSF spaces are normal. The midline structures are normal. Vascular: Major flow voids are preserved. Skull and upper cervical spine: Normal calvarium and skull base. Visualized upper cervical spine and soft tissues are normal. Sinuses/Orbits:No paranasal sinus fluid levels or advanced mucosal thickening. No mastoid or middle ear effusion. Normal orbits. IMPRESSION: Multifocal acute ischemia within both cerebellar hemispheres, the right occipital lobe and both frontal lobes. No hemorrhage or mass effect. Electronically Signed   By: Ulyses Jarred M.D.   On: 03/25/2020 22:10   CT Abdomen Pelvis W Contrast  Result Date: 04/02/2020 CLINICAL DATA:  Lower abdominal pain for 1 month. EXAM: CT ABDOMEN AND PELVIS WITH CONTRAST TECHNIQUE: Multidetector CT imaging of the abdomen and pelvis was performed using the standard protocol following bolus administration of intravenous contrast. CONTRAST:  172m OMNIPAQUE IOHEXOL 300 MG/ML  SOLN COMPARISON:  03/25/2020 FINDINGS: Lower chest: No acute abnormality. Hepatobiliary: No focal hepatic abnormality. Gallbladder  unremarkable. Pancreas: No focal abnormality or ductal dilatation. Spleen: No focal abnormality.  Normal size. Adrenals/Urinary Tract: Areas of cortical thinning and scarring in the mid and lower pole of the right kidney. Small cysts in the upper and mid poles of the right kidney. No hydronephrosis. Adrenal glands unremarkable. Urinary bladder moderately distended, grossly unremarkable otherwise. Stomach/Bowel: Moderate stool burden throughout the colon. Stomach, large and small bowel grossly unremarkable. Normal appendix. Vascular/Lymphatic: Densely calcified aorta and iliac vessels. No evidence of aneurysm or adenopathy. Reproductive: No visible focal abnormality. Other: No free fluid or free air. Musculoskeletal: No acute bony abnormality. IMPRESSION: Heavily calcified aorta and iliac vessels.  No aneurysm. No acute findings in the abdomen or pelvis. Areas of cortical thinning and scarring in the mid and lower pole of the right kidney, stable. Electronically Signed   By: KRolm BaptiseM.D.   On: 04/02/2020 11:42   DG Chest Port 1 View  Result Date: 04/02/2020 CLINICAL DATA:  Weakness and pain EXAM: PORTABLE CHEST 1 VIEW COMPARISON:  January 18, 2019 chest radiograph; chest CT March 25, 2020 FINDINGS: Lungs are clear. The heart size and pulmonary vascularity are normal. No adenopathy. There is aortic atherosclerosis. No bone lesions. IMPRESSION: Lungs clear. Heart size normal. Aortic Atherosclerosis (ICD10-I70.0). Electronically Signed   By: WLowella GripIII M.D.   On: 04/02/2020 08:55   ECHOCARDIOGRAM  COMPLETE  Result Date: 03/26/2020    ECHOCARDIOGRAM REPORT   Patient Name:   William Acevedo Date of Exam: 03/26/2020 Medical Rec #:  [id number]           Height:       66.0 in Accession #:    [id number]          Weight:       118.0 lb Date of Birth:  1955-10-13           BSA:          1.598 m Patient Age:    41 years            BP:           122/71 mmHg Patient Gender: M                   HR:            89 bpm. Exam Location:  Inpatient Procedure: 2D Echo, Color Doppler and Cardiac Doppler Indications:    NSTEMI  History:        Patient has no prior history of Echocardiogram examinations.                 Risk Factors:Diabetes, Hypertension, elevated troponin and                 Current Smoker. Polysubstance abuse.  Sonographer:    Dustin Flock Referring Phys: [id number] [place] IMPRESSIONS  1. Left ventricular ejection fraction, by estimation, is 65 to 70%. The left ventricle has normal function. The left ventricle has no regional wall motion abnormalities. There is mild concentric left ventricular hypertrophy. Left ventricular diastolic parameters are indeterminate.  2. Right ventricular systolic function is normal. The right ventricular size is normal. Tricuspid regurgitation signal is inadequate for assessing PA pressure.  3. The mitral valve is abnormal. Mild mitral valve regurgitation. No evidence of mitral stenosis. Moderate to severe mitral annular calcification.  4. The aortic valve has an indeterminant number of cusps. Aortic valve regurgitation is not visualized.  5. The inferior vena cava is normal in size with greater than 50% respiratory variability, suggesting right atrial pressure of 3 mmHg. Comparison(s): No prior Echocardiogram. Conclusion(s)/Recommendation(s): Normal biventricular function without evidence of hemodynamically significant valvular heart disease. FINDINGS  Left Ventricle: Left ventricular ejection fraction, by estimation, is 65 to 70%. The left ventricle has normal function. The left ventricle has no regional wall motion abnormalities. The left ventricular internal cavity size was normal in size. There is  mild concentric left ventricular hypertrophy. Left ventricular diastolic parameters are indeterminate. Right Ventricle: The right ventricular size is normal. No increase in right ventricular wall thickness. Right ventricular systolic function is normal. Tricuspid regurgitation  signal is inadequate for assessing PA pressure. Left Atrium: Left atrial size was normal in size. Right Atrium: Right atrial size was normal in size. Prominent Eustachian valve. Pericardium: There is no evidence of pericardial effusion. Mitral Valve: The mitral valve is abnormal. Moderate to severe mitral annular calcification. Mild mitral valve regurgitation. No evidence of mitral valve stenosis. Tricuspid Valve: The tricuspid valve is normal in structure. Tricuspid valve regurgitation is trivial. No evidence of tricuspid stenosis. Aortic Valve: The aortic valve has an indeterminant number of cusps. Aortic valve regurgitation is not visualized. Pulmonic Valve: The pulmonic valve was not well visualized. Pulmonic valve regurgitation is not visualized. Aorta: The aortic root, ascending aorta, aortic arch and descending aorta are all structurally normal, with no evidence of  dilitation or obstruction. Venous: The inferior vena cava is normal in size with greater than 50% respiratory variability, suggesting right atrial pressure of 3 mmHg. IAS/Shunts: The atrial septum is grossly normal.  LEFT VENTRICLE PLAX 2D LVIDd:         3.80 cm  Diastology LVIDs:         2.20 cm  LV e' medial:    5.66 cm/s LV PW:         1.10 cm  LV E/e' medial:  13.9 LV IVS:        1.10 cm  LV e' lateral:   6.85 cm/s LVOT diam:     1.80 cm  LV E/e' lateral: 11.5 LV SV:         68 LV SV Index:   42 LVOT Area:     2.54 cm  RIGHT VENTRICLE RV Basal diam:  2.70 cm RV S prime:     11.40 cm/s TAPSE (M-mode): 2.5 cm LEFT ATRIUM             Index       RIGHT ATRIUM          Index LA diam:        2.50 cm 1.56 cm/m  RA Area:     9.23 cm LA Vol (A2C):   45.2 ml 28.28 ml/m RA Volume:   20.00 ml 12.52 ml/m LA Vol (A4C):   30.2 ml 18.90 ml/m LA Biplane Vol: 40.7 ml 25.47 ml/m  AORTIC VALVE LVOT Vmax:   101.00 cm/s LVOT Vmean:  72.300 cm/s LVOT VTI:    0.266 m  AORTA Ao Root diam: 3.10 cm MITRAL VALVE MV Area (PHT): 4.06 cm     SHUNTS MV Decel Time: 187  msec     Systemic VTI:  0.27 m MV E velocity: 78.80 cm/s   Systemic Diam: 1.80 cm MV A velocity: 112.00 cm/s MV E/A ratio:  0.70 Buford Dresser MD Electronically signed by Buford Dresser MD Signature Date/Time: 03/26/2020/2:01:22 PM    Final    ECHO TEE  Result Date: 03/30/2020    TRANSESOPHOGEAL ECHO REPORT   Patient Name:   William Acevedo Date of Exam: 03/30/2020 Medical Rec #:  [id number]           Height:       66.0 in Accession #:    [id number]          Weight:       122.6 lb Date of Birth:  13-Feb-1955           BSA:          1.624 m Patient Age:    83 years            BP:           188/90 mmHg Patient Gender: M                   HR:           87 bpm. Exam Location:  Inpatient Procedure: Transesophageal Echo, 3D Echo, Cardiac Doppler, Color Doppler and            Saline Contrast Bubble Study Indications:     Stroke  History:         Patient has prior history of Echocardiogram examinations, most                  recent 03/26/2020. CHF; Risk Factors:Hypertension and Diabetes.  Sonographer:     Judson Roch  Pirrot[place] RDCS Referring Phys:  [id number] Alphonsa Overall MCDANIEL Diagnosing Phys: Candee Furbish MD PROCEDURE: After discussion of the risks and benefits of a TEE, an informed consent was obtained from the patient. The transesophogeal probe was passed without difficulty through the esophogus of the patient. Sedation performed by different physician. The patient was monitored while under deep sedation. Anesthestetic sedation was provided intravenously by Anesthesiology: 194.79m of Propofol, 832mof Lidocaine. The patient's vital signs; including heart rate, blood pressure, and oxygen saturation; remained stable throughout the procedure. The patient developed no complications during the procedure. IMPRESSIONS  1. Left ventricular ejection fraction, by estimation, is 60 to 65%. The left ventricle has normal function. The left ventricle has no regional wall motion abnormalities. There is mild left ventricular  hypertrophy.  2. Right ventricular systolic function is normal. The right ventricular size is normal.  3. Left atrial size was mildly dilated. No left atrial/left atrial appendage thrombus was detected.  4. There is a mobile echodensity measuring 0.6cm on the atrial surface attached to the mitral annular calcification. Can not exclude vegetation/ healed calcified vegetation. The mitral valve is normal in structure. Mild to moderate mitral valve regurgitation. No evidence of mitral stenosis.  5. The tricuspid valve is degenerative.  6. The aortic valve is normal in structure. Aortic valve regurgitation is not visualized. No aortic stenosis is present.  7. The inferior vena cava is normal in size with greater than 50% respiratory variability, suggesting right atrial pressure of 3 mmHg.  8. Positive late bubble cross over with Valsalva/pressure on abdomen. Conclusion(s)/Recommendation(s): Findings are concerning for vegetation/infective endocarditis as detailed above. Positive late bubble cross over with Valsalva/pressure on abdomen suggestive of small PFO.  FINDINGS  Left Ventricle: Left ventricular ejection fraction, by estimation, is 60 to 65%. The left ventricle has normal function. The left ventricle has no regional wall motion abnormalities. The left ventricular internal cavity size was normal in size. There is  mild left ventricular hypertrophy. Right Ventricle: The right ventricular size is normal. No increase in right ventricular wall thickness. Right ventricular systolic function is normal. Left Atrium: Left atrial size was mildly dilated. No left atrial/left atrial appendage thrombus was detected. Right Atrium: Right atrial size was normal in size. Pericardium: There is no evidence of pericardial effusion. Mitral Valve: There is a mobile echodensity measuring 0.6cm on the atrial surface attached to the mitral annular calcification. Can not exclude vegetation/ healed calcified vegetation. The mitral valve is  normal in structure. There is moderate thickening  of the mitral valve leaflet(s). Mild to moderate mitral valve regurgitation. No evidence of mitral valve stenosis. Tricuspid Valve: The tricuspid valve is degenerative in appearance. Tricuspid valve regurgitation is not demonstrated. No evidence of tricuspid stenosis. Aortic Valve: The aortic valve is normal in structure. Aortic valve regurgitation is not visualized. No aortic stenosis is present. Pulmonic Valve: The pulmonic valve was normal in structure. Pulmonic valve regurgitation is not visualized. No evidence of pulmonic stenosis. Aorta: The aortic root is normal in size and structure. Venous: The inferior vena cava is normal in size with greater than 50% respiratory variability, suggesting right atrial pressure of 3 mmHg. IAS/Shunts: No atrial level shunt detected by color flow Doppler. Agitated saline contrast was given intravenously to evaluate for intracardiac shunting. Positive late bubble cross over with Valsalva/pressure on abdomen. MaCandee FurbishD Electronically signed by MaCandee FurbishD Signature Date/Time: 03/30/2020/1:01:29 PM    Final    CT Angio Chest/Abd/Pel for Dissection W and/or Wo Contrast  Result  Date: 03/25/2020 CLINICAL DATA:  Abdominal pain, concern for aortic dissection EXAM: CT ANGIOGRAPHY CHEST, ABDOMEN AND PELVIS TECHNIQUE: Non-contrast CT of the chest was initially obtained. Multidetector CT imaging through the chest, abdomen and pelvis was performed using the standard protocol during bolus administration of intravenous contrast. Multiplanar reconstructed images and MIPs were obtained and reviewed to evaluate the vascular anatomy. CONTRAST:  152m OMNIPAQUE IOHEXOL 350 MG/ML SOLN COMPARISON:  03/14/2020 FINDINGS: CTA CHEST FINDINGS Cardiovascular: Stable caliber of the thoracic aorta with atherosclerotic plaque. No evidence of intramural hematoma or dissection. Noncalcified plaque along the proximal left subclavian causes 50%  stenosis. No evidence of central pulmonary embolus. Normal heart size. Coronary artery calcification. No pericardial effusion. Mediastinum/Nodes: No enlarged lymph nodes identified. Thyroid and esophagus are unremarkable. Lungs/Pleura: Mild paraseptal emphysema. No new consolidation. No pleural effusion or pneumothorax. Musculoskeletal: No acute osseous abnormality. Review of the MIP images confirms the above findings. CTA ABDOMEN AND PELVIS FINDINGS VASCULAR Aorta: Normal in caliber. Diffuse mixed but primarily calcified plaque. No evidence of dissection. As before, there is marked plaque at the bifurcation with luminal narrowing. Celiac: Patent origin with stable minimal narrowing. SMA: Patent origin with stable mild narrowing. Renals: Patent with stable at least moderate narrowing of the left renal artery origin. Patent small bilateral accessory renal arteries. The inferior right accessory renal artery is small in caliber with likely significant origin stenosis. IMA: Patent with stable moderate to marked stenosis at the origin. Inflow: Patent common iliacs with normal caliber and mixed but primarily calcified plaque. Patent internal and external iliacs. Veins: Not well evaluated. Review of the MIP images confirms the above findings. NON-VASCULAR Hepatobiliary: No significant abnormality. Pancreas: Unremarkable. Spleen: Unremarkable Adrenals/Urinary Tract: Adrenals are unremarkable. There is persistent hypoenhancement of the lower pole of the right kidney. Additional small areas of hypoenhancement are present within the cortex in the interpolar region. Punctate nonobstructing calculus of the interpolar right kidney. Bladder is unremarkable. Stomach/Bowel: Stomach is within normal limits. Bowel is normal in caliber. Paucity of mesenteric fat somewhat limits evaluation. Lymphatic: No enlarged lymph nodes identified. Reproductive: Unremarkable. Other: No ascites.  Abdominal wall is unremarkable. Musculoskeletal: No  acute osseous abnormality. Review of the MIP images confirms the above findings. IMPRESSION: No substantial change since 03/14/2020. There is no evidence of aortic dissection. Significant atherosclerosis throughout. As before, there is stenosis at the abdominal aorta branch origins and bifurcation. Hypoperfusion of the inferior right kidney likely with some areas of infarction as before. There is a small caliber accessory right renal artery primarily supplying the lower pole with high-grade origin stenosis. Electronically Signed   By: PMacy MisM.D.   On: 03/25/2020 16:41   VAS [place]OWER EXTREMITY VENOUS (DVT)  Result Date: 03/31/2020  Lower Venous DVT Study Indications: Stroke. Other Indications: Evidence of small PFO per TEE. Comparison Study: No prior studies. Performing Technologist: RDarlin CocoRDMS,RVT  Examination Guidelines: A complete evaluation includes B-mode imaging, spectral Doppler, color Doppler, and power Doppler as needed of all accessible portions of each vessel. Bilateral testing is considered an integral part of a complete examination. Limited examinations for reoccurring indications may be performed as noted. The reflux portion of the exam is performed with the patient in reverse Trendelenburg.  +---------+---------------+---------+-----------+----------+--------------+ RIGHT    CompressibilityPhasicitySpontaneityPropertiesThrombus Aging +---------+---------------+---------+-----------+----------+--------------+ CFV      Full           Yes      Yes                                 +---------+---------------+---------+-----------+----------+--------------+  SFJ      Full                                                        +---------+---------------+---------+-----------+----------+--------------+ FV Prox  Full                                                        +---------+---------------+---------+-----------+----------+--------------+ FV Mid   Full                                                         +---------+---------------+---------+-----------+----------+--------------+ FV DistalFull                                                        +---------+---------------+---------+-----------+----------+--------------+ PFV      Full                                                        +---------+---------------+---------+-----------+----------+--------------+ POP      Full           Yes      Yes                                 +---------+---------------+---------+-----------+----------+--------------+ PTV      Full                                                        +---------+---------------+---------+-----------+----------+--------------+ PERO     Full                                                        +---------+---------------+---------+-----------+----------+--------------+   +---------+---------------+---------+-----------+----------+--------------+ LEFT     CompressibilityPhasicitySpontaneityPropertiesThrombus Aging +---------+---------------+---------+-----------+----------+--------------+ CFV      Full           Yes      Yes                                 +---------+---------------+---------+-----------+----------+--------------+ SFJ      Full                                                        +---------+---------------+---------+-----------+----------+--------------+   FV Prox  Full                                                        +---------+---------------+---------+-----------+----------+--------------+ FV Mid   Full                                                        +---------+---------------+---------+-----------+----------+--------------+ FV DistalFull                                                        +---------+---------------+---------+-----------+----------+--------------+ PFV      Full                                                         +---------+---------------+---------+-----------+----------+--------------+ POP      Full           Yes      Yes                                 +---------+---------------+---------+-----------+----------+--------------+ PTV      Full                                                        +---------+---------------+---------+-----------+----------+--------------+ PERO     Full                                                        +---------+---------------+---------+-----------+----------+--------------+     Summary: RIGHT: - There is no evidence of deep vein thrombosis in the lower extremity.  - No cystic structure found in the popliteal fossa.  LEFT: - There is no evidence of deep vein thrombosis in the lower extremity.  - No cystic structure found in the popliteal fossa.  *See table(s) above for measurements and observations. Electronically signed by Deitra Mayo MD on 03/31/2020 at 12:36:53 PM.    Final     Microbiology: Recent Results (from the past 240 hour(s))  Resp Panel by RT-PCR (Flu A&B, Covid) Nasopharyngeal Swab     Status: None   Collection Time: 04/14/20  3:12 PM   Specimen: Nasopharyngeal Swab; Nasopharyngeal(NP) swabs in vial transport medium  Result Value Ref Range Status   SARS Coronavirus 2 by RT PCR NEGATIVE NEGATIVE Final    Comment: (NOTE) SARS-CoV-2 target nucleic acids are NOT DETECTED.  The SARS-CoV-2 RNA is generally detectable in upper respiratory specimens during the acute phase of infection. The lowest concentration of SARS-CoV-2  viral copies this assay can detect is 138 copies/mL. A negative result does not preclude SARS-Cov-2 infection and should not be used as the sole basis for treatment or other patient management decisions. A negative result may occur with  improper specimen collection/handling, submission of specimen other than nasopharyngeal swab, presence of viral mutation(s) within the areas targeted by this assay, and  inadequate number of viral copies(<138 copies/mL). A negative result must be combined with clinical observations, patient history, and epidemiological information. The expected result is Negative.  Fact Sheet for Patients:  EntrepreneurPulse.com.au  Fact Sheet for Healthcare Providers:  IncredibleEmployment.be  This test is no t yet approved or cleared by the [place] FDA and  has been authorized for detection and/or diagnosis of SARS-CoV-2 by FDA under an Emergency Use Authorization (EUA). This EUA will remain  in effect (meaning this test can be used) for the duration of the COVID-19 declaration under Section 564(b)(1) of the Act, 21 U.S.C.section 360bbb-3(b)(1), unless the authorization is terminated  or revoked sooner.       Influenza A by PCR NEGATIVE NEGATIVE Final   Influenza B by PCR NEGATIVE NEGATIVE Final    Comment: (NOTE) The Xpert Xpress SARS-CoV-2/FLU/RSV plus assay is intended as an aid in the diagnosis of influenza from Nasopharyngeal swab specimens and should not be used as a sole basis for treatment. Nasal washings and aspirates are unacceptable for Xpert Xpress SARS-CoV-2/FLU/RSV testing.  Fact Sheet for Patients: EntrepreneurPulse.com.au  Fact Sheet for Healthcare Providers: IncredibleEmployment.be  This test is not yet approved or cleared by the [place] FDA and has been authorized for detection and/or diagnosis of SARS-CoV-2 by FDA under an Emergency Use Authorization (EUA). This EUA will remain in effect (meaning this test can be used) for the duration of the COVID-19 declaration under Section 564(b)(1) of the Act, 21 U.S.C. section 360bbb-3(b)(1), unless the authorization is terminated or revoked.  Performed at [facility], [place] [address], [place], [place] [address]      Labs: Basic Metabolic Panel: Recent Labs  Lab 04/15/20 1103 04/16/20 0354  04/17/20 0402 04/18/20 0202 04/19/20 0610  NA 128* 133* 134* 134* 137  K 4.6 4.0 3.5 3.8 3.4*  CL 95* 101 102 98 98  CO2 _0 GLUCOSE 328* 133* 226* 277* 165*  BUN 49* 54* 51* 46* 39*  CREATININE 5.38* 5.01* 3.74* 2.93* 2.21*  CALCIUM 8.4* 8.2* 8.2* 8.5* 8.7*  MG  --  2.1 1.9 1.8  --   PHOS  --  3.8 3.4  --   --    Liver Function Tests: Recent Labs  Lab 04/14/20 1043 04/17/20 0402  AST 27  --   ALT 18  --   ALKPHOS 56  --   BILITOT 0.6  --   PROT 6.4*  --   ALBUMIN 3.2* 2.5*   Recent Labs  Lab 04/14/20 1043  LIPASE 41   No results for input(s): AMMONIA in the last 168 hours. CBC: Recent Labs  Lab 04/14/20 1043 04/14/20 1800 04/15/20 1103 04/16/20 0354 04/17/20 0402 04/18/20 0202  WBC 7.2  --  8.1 7.4 5.6 5.9  NEUTROABS 5.9  --   --   --   --   --   HGB 12.6* 12.2* 12.7* 11.3* 11.3* 11.4*  HCT 36.9* 35.1* 36.9* 33.4* 32.5* 32.8*  MCV 83.3  --  83.9 83.1 81.9 83.5  PLT 179  --  183 171 168 184   Cardiac Enzymes: Recent Labs  Lab 04/14/20 1600  CKTOTAL 171   BNP: BNP (last 3 results) No results for input(s): BNP in the last 8760 hours.  ProBNP (last 3 results) No results for input(s): PROBNP in the last 8760 hours.  CBG: Recent Labs  Lab 04/18/20 1142 04/18/20 1715 04/18/20 2143 04/19/20 0640 04/19/20 1134  GLUCAP 220* 231* 214* 192* 376*       Signed:  Kayleen Memos, MD [facility] 04/19/2020, 2:03 PM

## 2020-04-19 NOTE — TOC Transition Note (Addendum)
Transition of Care [facility]) - CM/SW Discharge Note   Patient Details  Name: William Acevedo MRN: [id number] Date of Birth: 08/14/1955  [facility]) CM/SW Contact:  Janae Bridgeman, RN Phone Number: 04/19/2020, 2:39 PM   Clinical Narrative:    Case management noted that patient is declining SNF placement and is requesting to go home with home health services for PT/OT.  I spoke with the patient and patient's brother on the phone and the patient will have 24 supervision and assistance from the brother at the home.  The patient's brother is aware of patient being discharged today and he is planning on picking him up for discharge by car.  I called and spoke with the patient and the patient was given choice regarding home health agency and the patient did not have a preference.  I called and spoke with Barbara Cower [facility] at [facility] and they are willing to providing home health services for the patient.  I called and left a message with April, CM at [place], [place] and she was made aware of home health services on the voicemail.  I will follow up in the am and fax clinicals to the [place] since the patient did not know his PCP at the [place].  Patient is going to be discharged home by bedside nursing.  04/19/2020 1500 - I called and spoke with MSW in ER at [place], [place] and the patient's primary MD is Dr. Dionicio Stall at [phone] and fax # [phone].  I faxed the discharge summary, home health orders, H&p and PT and OT eval [facility]  [facility] will follow up with the patient in the next 24-48 hours for home health services.  The patient's brother can pick up a 3 prong cane from a local pharmacy today if needed.  No dme was needed per the PT/OT note.  The patient can be discharged home with the patient's brother when he arrives today.   Final next level of care: Home w Home Health Services Barriers to Discharge: No Barriers  Identified   Patient Goals and CMS Choice Patient states their goals for this hospitalization and ongoing recovery are:: Patient is declining SNF placement and brother is able to provide 24 hour care at home. CMS Medicare.gov Compare Post Acute Care list provided to:: Patient Choice offered to / list presented to : Patient,Sibling  Discharge Placement                       Discharge Plan and Services In-house Referral: Clinical Social [facility] Discharge Planning Services: CM Consult Post Acute Care Choice: Home Health                    HH Arranged: PT,OT [facility] Agency: [facility] ([facility]) Date HH Agency Contacted: 04/19/20 Time HH Agency Contacted: [phone] Representative spoke with at [facility] Agency: [facility], Ambulance person  Social Determinants of Health (SDOH) Interventions     Readmission Risk Interventions Readmission Risk Prevention Plan 04/19/2020  Transportation Screening Complete  Medication Review [facility]) Complete  PCP or Specialist appointment within 3-5 days of discharge Complete  HRI or Home Care Consult Complete  SW Recovery Care/Counseling Consult Complete  Palliative Care Screening Not Applicable  Skilled Nursing Facility Not Applicable  Some recent data might be hidden

## 2020-04-23 ENCOUNTER — Other Ambulatory Visit: Payer: No Typology Code available for payment source

## 2020-04-26 NOTE — ED Triage Notes (Signed)
Pt arrives EMS from home with dx of shingles 2 weeks ago. Pt c/o right breast area radiating to back. Rash slightly still visible.

## 2020-04-27 ENCOUNTER — Encounter (HOSPITAL_COMMUNITY): Payer: Self-pay

## 2020-04-27 ENCOUNTER — Emergency Department (HOSPITAL_COMMUNITY)
Admission: EM | Admit: 2020-04-27 | Discharge: 2020-04-27 | Discharge status: Against Medical Advice | Payer: No Typology Code available for payment source

## 2020-04-27 ENCOUNTER — Other Ambulatory Visit: Payer: Self-pay

## 2020-04-27 NOTE — ED Notes (Signed)
Pt not in room to obtain vital signs. Eloped from triage.

## 2020-04-27 NOTE — ED Notes (Signed)
Pt called 3x for room placement and to finish triage. Eloped from waiting area.

## 2020-05-04 ENCOUNTER — Encounter (HOSPITAL_COMMUNITY): Payer: Self-pay

## 2020-05-04 ENCOUNTER — Encounter (HOSPITAL_COMMUNITY): Payer: Medicare Other | Attending: Gastroenterology

## 2020-06-05 ENCOUNTER — Emergency Department (HOSPITAL_COMMUNITY): Payer: No Typology Code available for payment source

## 2020-06-05 ENCOUNTER — Other Ambulatory Visit: Payer: Self-pay

## 2020-06-05 ENCOUNTER — Inpatient Hospital Stay (HOSPITAL_COMMUNITY)
Admission: EM | Admit: 2020-06-05 | Discharge: 2020-06-09 | DRG: 064 | Disposition: A | Discharge status: Home / Self Care | Payer: No Typology Code available for payment source | Attending: Internal Medicine | Admitting: Internal Medicine

## 2020-06-05 DIAGNOSIS — E785 Hyperlipidemia, unspecified: Secondary | ICD-10-CM | POA: Diagnosis present

## 2020-06-05 DIAGNOSIS — I2510 Atherosclerotic heart disease of native coronary artery without angina pectoris: Secondary | ICD-10-CM | POA: Diagnosis present

## 2020-06-05 DIAGNOSIS — I252 Old myocardial infarction: Secondary | ICD-10-CM

## 2020-06-05 DIAGNOSIS — R109 Unspecified abdominal pain: Secondary | ICD-10-CM | POA: Diagnosis present

## 2020-06-05 DIAGNOSIS — I160 Hypertensive urgency: Secondary | ICD-10-CM | POA: Diagnosis present

## 2020-06-05 DIAGNOSIS — E119 Type 2 diabetes mellitus without complications: Secondary | ICD-10-CM

## 2020-06-05 DIAGNOSIS — I63441 Cerebral infarction due to embolism of right cerebellar artery: Secondary | ICD-10-CM | POA: Diagnosis present

## 2020-06-05 DIAGNOSIS — I10 Essential (primary) hypertension: Secondary | ICD-10-CM | POA: Diagnosis present

## 2020-06-05 DIAGNOSIS — G8929 Other chronic pain: Secondary | ICD-10-CM | POA: Diagnosis present

## 2020-06-05 DIAGNOSIS — I63443 Cerebral infarction due to embolism of bilateral cerebellar arteries: Secondary | ICD-10-CM | POA: Diagnosis not present

## 2020-06-05 DIAGNOSIS — R569 Unspecified convulsions: Secondary | ICD-10-CM

## 2020-06-05 DIAGNOSIS — Z794 Long term (current) use of insulin: Secondary | ICD-10-CM

## 2020-06-05 DIAGNOSIS — Z888 Allergy status to other drugs, medicaments and biological substances status: Secondary | ICD-10-CM

## 2020-06-05 DIAGNOSIS — Z681 Body mass index (BMI) 19 or less, adult: Secondary | ICD-10-CM

## 2020-06-05 DIAGNOSIS — Z20822 Contact with and (suspected) exposure to covid-19: Secondary | ICD-10-CM | POA: Diagnosis present

## 2020-06-05 DIAGNOSIS — E1165 Type 2 diabetes mellitus with hyperglycemia: Secondary | ICD-10-CM | POA: Diagnosis present

## 2020-06-05 DIAGNOSIS — Z7984 Long term (current) use of oral hypoglycemic drugs: Secondary | ICD-10-CM

## 2020-06-05 DIAGNOSIS — E46 Unspecified protein-calorie malnutrition: Secondary | ICD-10-CM | POA: Diagnosis present

## 2020-06-05 DIAGNOSIS — Z8673 Personal history of transient ischemic attack (TIA), and cerebral infarction without residual deficits: Secondary | ICD-10-CM

## 2020-06-05 DIAGNOSIS — B0229 Other postherpetic nervous system involvement: Secondary | ICD-10-CM | POA: Diagnosis present

## 2020-06-05 DIAGNOSIS — I6389 Other cerebral infarction: Secondary | ICD-10-CM | POA: Diagnosis present

## 2020-06-05 DIAGNOSIS — R112 Nausea with vomiting, unspecified: Secondary | ICD-10-CM

## 2020-06-05 DIAGNOSIS — Z8619 Personal history of other infectious and parasitic diseases: Secondary | ICD-10-CM

## 2020-06-05 DIAGNOSIS — F1210 Cannabis abuse, uncomplicated: Secondary | ICD-10-CM | POA: Diagnosis present

## 2020-06-05 DIAGNOSIS — Z885 Allergy status to narcotic agent status: Secondary | ICD-10-CM

## 2020-06-05 DIAGNOSIS — R29712 NIHSS score 12: Secondary | ICD-10-CM | POA: Diagnosis present

## 2020-06-05 DIAGNOSIS — F1410 Cocaine abuse, uncomplicated: Secondary | ICD-10-CM

## 2020-06-05 DIAGNOSIS — G9341 Metabolic encephalopathy: Secondary | ICD-10-CM | POA: Diagnosis present

## 2020-06-05 DIAGNOSIS — R471 Dysarthria and anarthria: Secondary | ICD-10-CM | POA: Diagnosis present

## 2020-06-05 DIAGNOSIS — F1910 Other psychoactive substance abuse, uncomplicated: Secondary | ICD-10-CM | POA: Diagnosis present

## 2020-06-05 DIAGNOSIS — Z79899 Other long term (current) drug therapy: Secondary | ICD-10-CM

## 2020-06-05 DIAGNOSIS — R54 Age-related physical debility: Secondary | ICD-10-CM | POA: Diagnosis present

## 2020-06-05 DIAGNOSIS — N280 Ischemia and infarction of kidney: Secondary | ICD-10-CM | POA: Diagnosis present

## 2020-06-05 DIAGNOSIS — I639 Cerebral infarction, unspecified: Secondary | ICD-10-CM | POA: Diagnosis present

## 2020-06-05 DIAGNOSIS — F17210 Nicotine dependence, cigarettes, uncomplicated: Secondary | ICD-10-CM | POA: Diagnosis present

## 2020-06-05 DIAGNOSIS — G9340 Encephalopathy, unspecified: Secondary | ICD-10-CM

## 2020-06-05 DIAGNOSIS — N179 Acute kidney failure, unspecified: Secondary | ICD-10-CM | POA: Diagnosis present

## 2020-06-05 LAB — RAPID URINE DRUG SCREEN, HOSP PERFORMED
Amphetamines: NOT DETECTED
Barbiturates: NOT DETECTED
Benzodiazepines: NOT DETECTED
Cocaine: POSITIVE — AB
Opiates: NOT DETECTED
Tetrahydrocannabinol: NOT DETECTED

## 2020-06-05 LAB — I-STAT CHEM 8, ED
BUN: 11 mg/dL (ref 8–23)
Calcium, Ion: 1.19 mmol/L (ref 1.15–1.40)
Chloride: 99 mmol/L (ref 98–111)
Creatinine, Ser: 0.9 mg/dL (ref 0.61–1.24)
Glucose, Bld: 331 mg/dL — ABNORMAL HIGH (ref 70–99)
HCT: 31 % — ABNORMAL LOW (ref 39.0–52.0)
Hemoglobin: 10.5 g/dL — ABNORMAL LOW (ref 13.0–17.0)
Potassium: 4.2 mmol/L (ref 3.5–5.1)
Sodium: 137 mmol/L (ref 135–145)
TCO2: 28 mmol/L (ref 22–32)

## 2020-06-05 LAB — CBC WITH DIFFERENTIAL/PLATELET
Abs Immature Granulocytes: 0.02 10*3/uL (ref 0.00–0.07)
Basophils Absolute: 0 10*3/uL (ref 0.0–0.1)
Basophils Relative: 0 %
Eosinophils Absolute: 0 10*3/uL (ref 0.0–0.5)
Eosinophils Relative: 1 %
HCT: 32.7 % — ABNORMAL LOW (ref 39.0–52.0)
Hemoglobin: 10.5 g/dL — ABNORMAL LOW (ref 13.0–17.0)
Immature Granulocytes: 0 %
Lymphocytes Relative: 26 %
Lymphs Abs: 1.6 10*3/uL (ref 0.7–4.0)
MCH: 28.9 pg (ref 26.0–34.0)
MCHC: 32.1 g/dL (ref 30.0–36.0)
MCV: 90.1 fL (ref 80.0–100.0)
Monocytes Absolute: 0.4 10*3/uL (ref 0.1–1.0)
Monocytes Relative: 6 %
Neutro Abs: 4 10*3/uL (ref 1.7–7.7)
Neutrophils Relative %: 67 %
Platelets: 341 10*3/uL (ref 150–400)
RBC: 3.63 MIL/uL — ABNORMAL LOW (ref 4.22–5.81)
RDW: 13.4 % (ref 11.5–15.5)
WBC: 6.1 10*3/uL (ref 4.0–10.5)
nRBC: 0 % (ref 0.0–0.2)

## 2020-06-05 LAB — COMPREHENSIVE METABOLIC PANEL
ALT: 18 U/L (ref 0–44)
AST: 29 U/L (ref 15–41)
Albumin: 3.7 g/dL (ref 3.5–5.0)
Alkaline Phosphatase: 62 U/L (ref 38–126)
Anion gap: 10 (ref 5–15)
BUN: 10 mg/dL (ref 8–23)
CO2: 27 mmol/L (ref 22–32)
Calcium: 9.3 mg/dL (ref 8.9–10.3)
Chloride: 96 mmol/L — ABNORMAL LOW (ref 98–111)
Creatinine, Ser: 1.12 mg/dL (ref 0.61–1.24)
GFR, Estimated: >60 mL/min (ref >60)
Glucose, Bld: 351 mg/dL — ABNORMAL HIGH (ref 70–99)
Potassium: 4 mmol/L (ref 3.5–5.1)
Sodium: 133 mmol/L — ABNORMAL LOW (ref 135–145)
Total Bilirubin: 0.6 mg/dL (ref 0.3–1.2)
Total Protein: 7.8 g/dL (ref 6.5–8.1)

## 2020-06-05 LAB — URINALYSIS, ROUTINE W REFLEX MICROSCOPIC
Bilirubin Urine: NEGATIVE
Glucose, UA: >=500 mg/dL — AB
Hgb urine dipstick: NEGATIVE
Ketones, ur: 5 mg/dL — AB
Leukocytes,Ua: NEGATIVE
Nitrite: NEGATIVE
Protein, ur: 100 mg/dL — AB
Specific Gravity, Urine: 1.011 (ref 1.005–1.030)
pH: 9 — ABNORMAL HIGH (ref 5.0–8.0)

## 2020-06-05 LAB — LIPASE, BLOOD: Lipase: 26 U/L (ref 11–51)

## 2020-06-05 LAB — CBG MONITORING, ED
Glucose-Capillary: 323 mg/dL — ABNORMAL HIGH (ref 70–99)
Glucose-Capillary: 283 mg/dL — ABNORMAL HIGH (ref 70–99)

## 2020-06-05 LAB — TROPONIN I (HIGH SENSITIVITY): Troponin I (High Sensitivity): 21 ng/L — ABNORMAL HIGH (ref <18)

## 2020-06-05 LAB — ETHANOL: Alcohol, Ethyl (B): <10 mg/dL (ref <10)

## 2020-06-05 MED ORDER — ONDANSETRON HCL 4 MG/2ML IJ SOLN
4.0000 mg | Freq: Once | INTRAMUSCULAR | Status: AC
  Administered 2020-06-05: 4 mg via INTRAVENOUS
  Filled 2020-06-05: qty 2

## 2020-06-05 MED ORDER — FENTANYL CITRATE (PF) 100 MCG/2ML IJ SOLN
50.0000 ug | Freq: Once | INTRAMUSCULAR | Status: AC
  Administered 2020-06-05: 50 ug via INTRAVENOUS
  Filled 2020-06-05: qty 2

## 2020-06-05 NOTE — ED Provider Notes (Signed)
[facility] EMERGENCY DEPARTMENT Provider Note   CSN: [id number] Arrival date & time: 06/05/20  1816     History Chief Complaint  Patient presents with  . Seizures  . Abdominal Pain    William Acevedo is a 65 y.o. male.  HPI     65yo male with history of CVA, hypertension, DM, polysubstance (cocaine and marijuana), TEE showing mitral valve calcification versus vegetation during admission 3/20, admission for AKI, abdominal pain, hypertensive urgence, acute metabolic encephalopathy 4/80-1/6 who initially presented to the ED with abdominal pain, nausea and vomiting and had seizure like activity at registration and then had altered mental status.  History is limited by altered mental status.  (Initially not speaking, not following commands, opening eyes to pain.)  He told triage provider that he was having abdominal pain, nausea vomiting, and that his "right arm felt funny"  Called brother, William Acevedo, who last saw him 3 hours PTA and said he was "quiet"  And laying in his bed but did not spend enough time with him to note an focal neurologic deficits.  States he was last known normal around 1AM last night.  He did not speak with him much this afternoon but his brother did not tell him he was having abdominal pain, n/v.  He does confirm that William Acevedo uses cocaine.  Reports he does take his medications as prescribed.  No known falls or trauma.  He has not complained of fever, cough, or other concerns.   As William Acevedo became more aware, he did provide history that he was having diffuse abdominal pain that is severe.  Reports that it began this afternoon.  Reports associated nausea and vomiting.  Nodded his head to chest pain.  Denies shortness of breath or cough. Acknowledges cocaine use. Last drink yesterday, no history of withdrawals, no history of shaking.    Past Medical History:  Diagnosis Date  . Carpal tunnel syndrome   . Depression   . Diabetes mellitus  without complication ([place])   . Hypertensive urgency 04/14/2020  . Neuropathy     Patient Active Problem List   Diagnosis Date Noted  . Acute ischemic multifocal multiple vascular territories stroke ([place]) 06/06/2020  . Seizure-like activity ([place]) 06/06/2020  . ARF (acute renal failure) ([place]) 04/14/2020  . Acute blood loss anemia 04/14/2020  . GI bleed 04/14/2020  . Nausea & vomiting 04/14/2020  . Acute metabolic encephalopathy 55/37/4827  . Hypertensive urgency 04/14/2020  . Abdominal pain 04/02/2020  . Protein-calorie malnutrition, severe 03/27/2020  . Cerebral thrombosis with cerebral infarction 03/26/2020  . NSTEMI (non-ST elevated myocardial infarction) ([place]) 03/25/2020  . Secondary diabetes mellitus with HHNC (hyperglycemia hyperosmolar non-ketotic coma) ([place]) 03/25/2020  . Elevated troponin   . Renal infarction ([place])   . Cerebrovascular accident (CVA) due to embolism of cerebral artery ([place])   . Cocaine use disorder, severe, dependence ([place]) 04/08/2019  . Major depressive disorder 04/08/2019  . Epiglottitis 04/07/2019  . Cocaine use 04/07/2019  . Acute encephalopathy 04/07/2019  . Overdose 07/06/2015  . AKI (acute kidney injury) ([place]) 07/06/2015  . Polysubstance abuse ([place]) 07/06/2015  . DM2 (diabetes mellitus, type 2) ([place]) 07/06/2015  . Dehydration   . MDD (major depressive disorder), recurrent episode, mild ([place])   . Uncomplicated alcohol dependence ([place])     Past Surgical History:  Procedure Laterality Date  . BUBBLE STUDY  03/30/2020   Procedure: BUBBLE STUDY;  Surgeon: Jerline Pain, MD;  Location: [facility] ENDOSCOPY;  Service: Cardiovascular;;  .  TEE WITHOUT CARDIOVERSION N/A 03/30/2020   Procedure: TRANSESOPHAGEAL ECHOCARDIOGRAM (TEE);  Surgeon: Jerline Pain, MD;  Location: [facility] ENDOSCOPY;  Service: Cardiovascular;  Laterality: N/A;  . TESTICLE TORSION REDUCTION         History reviewed. No pertinent family history.  Social History   Tobacco Use  . Smoking  status: Current Every Day Smoker    Packs/day: 0.50    Types: Cigarettes  . Smokeless tobacco: Never Used  Substance Use Topics  . Alcohol use: Yes  . Drug use: Yes    Types: Cocaine    Home Medications Prior to Admission medications   Medication Sig Start Date End Date Taking? Authorizing Provider  acetaminophen (TYLENOL) 325 MG tablet Take 2 tablets (650 mg total) by mouth every 6 (six) hours as needed for mild pain (or Fever >/= 101). 04/05/20   Oswald Hillock, MD  amitriptyline (ELAVIL) 10 MG tablet Take 20 mg by mouth at bedtime.    [provider]  amLODipine (NORVASC) 10 MG tablet Take 1 tablet (10 mg total) by mouth daily. 04/20/20 07/19/20  Kayleen Memos, DO  atorvastatin (LIPITOR) 20 MG tablet Take 20 mg by mouth daily.    [provider]  blood glucose meter kit and supplies KIT Dispense based on patient and insurance preference. Use up to four times daily as directed. (FOR ICD-9 250.00, 250.01). 09/13/19   Isla Pence, MD  calamine lotion Apply topically as needed for itching. 04/19/20   Kayleen Memos, DO  cyclobenzaprine (FLEXERIL) 5 MG tablet Take 1 tablet (5 mg total) by mouth 3 (three) times daily as needed for muscle spasms. 03/30/20   Arrien, Jimmy Picket, MD  dicyclomine (BENTYL) 20 MG tablet Take 1 tablet (20 mg total) by mouth 3 (three) times daily before meals. 04/07/20   Tedd Sias, PA  docusate sodium (COLACE) 100 MG capsule Take 1 capsule (100 mg total) by mouth 2 (two) times daily. 04/05/20   Oswald Hillock, MD  empagliflozin (JARDIANCE) 25 MG TABS tablet Take 25 mg by mouth daily.    [provider]  famotidine (PEPCID) 20 MG tablet Take 1 tablet (20 mg total) by mouth daily. 04/05/20 08/03/20  Oswald Hillock, MD  gabapentin (NEURONTIN) 600 MG tablet Take 0.5 tablets (300 mg total) by mouth at bedtime. 04/19/20 05/19/20  Kayleen Memos, DO  glipiZIDE (GLUCOTROL) 10 MG tablet Take 1 tablet (10 mg total) by mouth daily before breakfast. Patient  taking differently: Take 10 mg by mouth daily. 08/24/19   [name], PA-C  hydrALAZINE (APRESOLINE) 50 MG tablet Take 1 tablet (50 mg total) by mouth every 8 (eight) hours. 04/19/20 07/18/20  Kayleen Memos, DO  HYDROmorphone (DILAUDID) 2 MG tablet Take 1 tablet (2 mg total) by mouth every 4 (four) hours as needed for severe pain. 04/11/20   Sherwood Gambler, MD  insulin aspart (NOVOLOG FLEXPEN) 100 UNIT/ML FlexPen Inject 5 Units into the skin 3 (three) times daily with meals. 04/05/20   Oswald Hillock, MD  insulin glargine (LANTUS) 100 UNIT/ML Solostar Pen Inject 12 Units into the skin daily. 04/19/20   Kayleen Memos, DO  Insulin Pen Needle 29G X 12MM MISC Per instructions 04/08/19   [name], MD  melatonin 3 MG TABS tablet Take 12 mg by mouth at bedtime.    [provider]  Multiple Vitamin (MULTIVITAMIN WITH MINERALS) TABS tablet Take 1 tablet by mouth daily. 04/20/20 07/19/20  Kayleen Memos, DO  ondansetron (ZOFRAN ODT) 4 MG disintegrating tablet Take 1 tablet (4 mg total) by mouth every 8 (eight) hours as needed for nausea or vomiting. 04/11/20   Sherwood Gambler, MD  pantoprazole (PROTONIX) 40 MG tablet Take 1 tablet (40 mg total) by mouth 2 (two) times daily. 04/19/20 05/19/20  Kayleen Memos, DO  polyethylene glycol (MIRALAX / GLYCOLAX) 17 g packet Take 17 g by mouth daily as needed for moderate constipation. 03/30/20   Arrien, Jimmy Picket, MD  sucralfate (CARAFATE) 1 g tablet Take 1 tablet (1 g total) by mouth 2 (two) times daily. 04/05/20 05/05/20  Oswald Hillock, MD  tadalafil (CIALIS) 10 MG tablet Take 20 mg by mouth daily as needed for erectile dysfunction.    [provider]    Allergies    Trazodone, Lisinopril, and Omeprazole  Review of Systems   Review of Systems  Unable to perform ROS: Mental status change  Constitutional: Negative for fever.  Respiratory: Negative for shortness of breath.   Cardiovascular: Positive for chest pain.  Gastrointestinal: Positive  for abdominal pain, diarrhea, nausea and vomiting.  Genitourinary: Negative for difficulty urinating.  Skin: Negative for rash.  Neurological: Positive for seizures, syncope and headaches.    Physical Exam Updated Vital Signs BP (!) 186/103   Pulse (!) 109   Temp 98.8 F (37.1 C) (Oral)   Resp (!) 26   Ht 5' 6"  (1.676 m)   Wt 56 kg   SpO2 97%   BMI 19.93 kg/m   Physical Exam Vitals and nursing note reviewed.  Constitutional:      General: He is not in acute distress.    Appearance: He is well-developed. He is not diaphoretic.  HENT:     Head: Normocephalic and atraumatic.  Eyes:     Conjunctiva/sclera: Conjunctivae normal.  Cardiovascular:     Rate and Rhythm: Normal rate and regular rhythm.     Heart sounds: Normal heart sounds. No murmur heard. No friction rub. No gallop.   Pulmonary:     Effort: Pulmonary effort is normal. No respiratory distress.     Breath sounds: Normal breath sounds. No wheezing or rales.  Abdominal:     General: There is no distension.     Palpations: Abdomen is soft.     Tenderness: There is no abdominal tenderness. There is no guarding.  Musculoskeletal:     Cervical back: Normal range of motion.  Skin:    General: Skin is warm and dry.  Neurological:     Mental Status: He is alert and oriented to person, place, and time.     Comments: Initially eyes open to pain, not following commands/withdrawing or localizing to pain, but does spontaneously move all 4 extremiteis, at times seemed to show awareness, tracking  Displayed a few episodes of nodding and bilateral arm shaking that seemed to respond to external stimuli  Frequent yawning, sits up have dry heaves  When more awake, equal strength bilaterally, eyes with normal ROM, symmetric facies     ED Results / Procedures / Treatments   Labs (all labs ordered are listed, but only abnormal results are displayed) Labs Reviewed  CBC WITH DIFFERENTIAL/PLATELET - Abnormal; Notable for the  following components:      Result Value   RBC 3.63 (*)    Hemoglobin 10.5 (*)    HCT 32.7 (*)    All other components within normal limits  COMPREHENSIVE METABOLIC PANEL - Abnormal; Notable for the following components:   Sodium 133 (*)  Chloride 96 (*)    Glucose, Bld 351 (*)    All other components within normal limits  URINALYSIS, ROUTINE W REFLEX MICROSCOPIC - Abnormal; Notable for the following components:   Color, Urine STRAW (*)    pH 9.0 (*)    Glucose, UA >=500 (*)    Ketones, ur 5 (*)    Protein, ur 100 (*)    Bacteria, UA RARE (*)    All other components within normal limits  RAPID URINE DRUG SCREEN, HOSP PERFORMED - Abnormal; Notable for the following components:   Cocaine POSITIVE (*)    All other components within normal limits  CBG MONITORING, ED - Abnormal; Notable for the following components:   Glucose-Capillary 283 (*)    All other components within normal limits  I-STAT CHEM 8, ED - Abnormal; Notable for the following components:   Glucose, Bld 331 (*)    Hemoglobin 10.5 (*)    HCT 31.0 (*)    All other components within normal limits  CBG MONITORING, ED - Abnormal; Notable for the following components:   Glucose-Capillary 323 (*)    All other components within normal limits  TROPONIN I (HIGH SENSITIVITY) - Abnormal; Notable for the following components:   Troponin I (High Sensitivity) 21 (*)    All other components within normal limits  LIPASE, BLOOD  ETHANOL  HIV ANTIBODY (ROUTINE TESTING W REFLEX)  HEMOGLOBIN A1C  LIPID PANEL  TROPONIN I (HIGH SENSITIVITY)    EKG None  Radiology CT Head Wo Contrast  Result Date: 06/05/2020 CLINICAL DATA:  Seizure EXAM: CT HEAD WITHOUT CONTRAST TECHNIQUE: Contiguous axial images were obtained from the base of the skull through the vertex without intravenous contrast. COMPARISON:  CT 04/14/2020, 03/25/2020, MRI 03/25/2020 FINDINGS: Brain: No hemorrhage or intracranial mass. Small chronic infarcts in the right  cerebellum and right white matter. Age indeterminate subcortical infarct at the right frontal parietal junction, series 2, image 28. Mild chronic small vessel ischemic changes of the white matter. Chronic left occipital infarct. Stable ventricle size Vascular: No hyperdense vessels.  Carotid vascular calcification Skull: Normal. Negative for fracture or focal lesion. Sinuses/Orbits: No acute finding. Other: None IMPRESSION: 1. New age indeterminate suspected subcortical infarct at the right frontal parietal junction. 2. Otherwise stable chronic small vessel ischemic changes of the white matter and chronic infarcts within the left occipital lobe, right cerebellum and right white matter. Electronically Signed   By: Donavan Foil M.D.   On: 06/05/2020 19:04   MR BRAIN WO CONTRAST  Result Date: 06/06/2020 CLINICAL DATA:  Seizure EXAM: MRI HEAD WITHOUT CONTRAST TECHNIQUE: Multiplanar, multiecho pulse sequences of the brain and surrounding structures were obtained without intravenous contrast. COMPARISON:  03/25/2020 FINDINGS: Brain: Multifocal abnormal diffusion restriction within both cerebral hemispheres and the right cerebellum, new since the prior study. The largest area of acute ischemia is in the anterior right parietal lobe. No acute or chronic hemorrhage. There is multifocal hyperintense T2-weighted signal within the white matter. Parenchymal volume and CSF spaces are normal. There is an old right cerebellar infarct. The midline structures are normal. Vascular: Major flow voids are preserved. Skull and upper cervical spine: Normal calvarium and skull base. Visualized upper cervical spine and soft tissues are normal. Sinuses/Orbits:No paranasal sinus fluid levels or advanced mucosal thickening. No mastoid or middle ear effusion. Normal orbits. IMPRESSION: Multifocal acute ischemia within both cerebral hemispheres and right cerebellum. Distribution is most consistent with a central (cardiac or aortic) embolic  process. No hemorrhage or mass effect.  Electronically Signed   By: Ulyses Jarred M.D.   On: 06/06/2020 01:10   CT Angio Chest/Abd/Pel for Dissection W and/or Wo Contrast  Result Date: 06/06/2020 CLINICAL DATA:  Abdominal pain, aortic dissection suspected. EXAM: CT ANGIOGRAPHY CHEST, ABDOMEN AND PELVIS TECHNIQUE: Non-contrast CT of the chest was initially obtained. Multidetector CT imaging through the chest, abdomen and pelvis was performed using the standard protocol during bolus administration of intravenous contrast. Multiplanar reconstructed images and MIPs were obtained and reviewed to evaluate the vascular anatomy. CONTRAST:  31m OMNIPAQUE IOHEXOL 350 MG/ML SOLN COMPARISON:  CT abdomen pelvis 04/14/2020 FINDINGS: CTA CHEST FINDINGS Cardiovascular: Preferential opacification of the thoracic aorta. No evidence of thoracic aortic aneurysm or dissection. Severe calcified and noncalcified atherosclerotic plaque of the thoracic aorta. At least moderate stenosis of the proximal left subclavian artery due to calcified and noncalcified atherosclerotic plaque (7:23). Normal heart size. No significant pericardial effusion. Four-vessel coronary artery calcifications. Mediastinum/Nodes: No enlarged mediastinal, hilar, or axillary lymph nodes. Thyroid gland, trachea, and esophagus demonstrate no significant findings. Lungs/Pleura: Biapical pleural/pulmonary scarring. Mild biapical paraseptal emphysematous changes. Right lower lobe subpleural micronodule (8:81). Right lower lobe calcified subpleural micronodule (8:96). Calcified micronodule along the left major fissure in the left lower lobe (8: 108). No pulmonary mass. No focal consolidation. No pleural effusion. No pneumothorax. Musculoskeletal: No chest wall abnormality. No suspicious lytic or blastic osseous lesions. No acute displaced fracture. Review of the MIP images confirms the above findings. CTA ABDOMEN AND PELVIS FINDINGS VASCULAR Aorta: Severe calcified and  noncalcified atherosclerotic plaque. Normal caliber aorta without aneurysm, dissection, vasculitis or significant stenosis. Celiac: At least moderate atherosclerotic plaque at its origin. Patent without evidence of aneurysm, dissection, vasculitis or significant stenosis. SMA: At least moderate atherosclerotic plaque at its origin. Patent without evidence of aneurysm, dissection, vasculitis or significant stenosis. Renals: 2 right renal arteries are noted within the inferior pole supplied by the diminutive of the 2 arteries. Is diminutive artery is likely filling retrograde with the origin stenosed by atherosclerotic plaque. 2 left renal arteries are noted. Mild-to-moderate atherosclerotic plaque. No aneurysm, dissection, vasculitis, fibromuscular dysplasia or significant stenosis. IMA: Patent without evidence of aneurysm, dissection, vasculitis or significant stenosis. Inflow: Severe atherosclerotic plaque with the right common iliac artery measuring at the upper limits of normal (1.5 cm). Otherwise patent without evidence of aneurysm, dissection, vasculitis or significant stenosis. Veins: No obvious venous abnormality within the limitations of this arterial phase study. Review of the MIP images confirms the above findings. NON-VASCULAR Hepatobiliary: No adrenal nodule bilaterally. Bilateral kidneys enhance symmetrically. Redemonstration of a punctate right nephrolithiasis. No hydronephrosis. No hydroureter. The urinary bladder is unremarkable. Pancreas: No focal lesion. Normal pancreatic contour. No surrounding inflammatory changes. No main pancreatic ductal dilatation. Spleen: Normal in size without focal abnormality. Adrenals/Urinary Tract: No adrenal nodule bilaterally. There is a markedly delayed nephrogram of the inferior right renal pole. Remainder of bilateral kidneys enhance symmetrically. Bilateral subcentimeter hypodensities are too small to characterize. No hydronephrosis. No hydroureter. The urinary  bladder is distended with urine. Stomach/Bowel: Stomach is within normal limits. No evidence of bowel wall thickening or dilatation. Question few scattered colonic diverticula of the left colon. Appendix appears normal. Lymphatic: No definite lymphadenopathy. Reproductive: Prominent poorly visualized prostate gland. Other: No intraperitoneal free fluid. No intraperitoneal free gas. No organized fluid collection. Musculoskeletal: No abdominal wall hernia or abnormality. No suspicious lytic or blastic osseous lesions. No acute displaced fracture. Review of the MIP images confirms the above findings. IMPRESSION: 1. Right inferior renal pole  infarction with thrombosis of the diminutive accessory right renal artery. 2. No thoracic or abdominal aorta aneurysm or dissection in a patient with severe atherosclerosis. Aortic Atherosclerosis (ICD10-I70.0) with four-vessel coronary artery calcifications. 3. Ectatic right common iliac artery (1.5 cm). 4. Other imaging findings of potential clinical significance: Nonobstructive punctate right nephrolithiasis. Emphysema (ICD10-J43.9). These results were called by telephone at the time of interpretation on 06/06/2020 at 12:52 am to provider [facility] , who verbally acknowledged these results. Electronically Signed   By: Iven Finn M.D.   On: 06/06/2020 00:56    Procedures .Critical Care Performed by: Gareth Morgan, MD Authorized by: Gareth Morgan, MD   Critical care provider statement:    Critical care time (minutes):  60   Critical care was time spent personally by me on the following activities:  Discussions with consultants, evaluation of patient's response to treatment, examination of patient, ordering and performing treatments and interventions, ordering and review of laboratory studies, ordering and review of radiographic studies, pulse oximetry, re-evaluation of patient's condition, obtaining history from patient or surrogate and review of old  charts     Medications Ordered in ED Medications   stroke: mapping our early stages of recovery book (has no administration in time range)  0.9 %  sodium chloride infusion (has no administration in time range)  acetaminophen (TYLENOL) tablet 650 mg (has no administration in time range)    Or  acetaminophen (TYLENOL) 160 MG/5ML solution 650 mg (has no administration in time range)    Or  acetaminophen (TYLENOL) suppository 650 mg (has no administration in time range)  senna-docusate (Senokot-S) tablet 1 tablet (has no administration in time range)  insulin glargine (LANTUS) injection 6 Units (has no administration in time range)  insulin aspart (novoLOG) injection 0-9 Units (has no administration in time range)  enoxaparin (LOVENOX) injection 40 mg (has no administration in time range)  fentaNYL (SUBLIMAZE) injection 50 mcg (has no administration in time range)  ondansetron (ZOFRAN) injection 4 mg (4 mg Intravenous Given 06/05/20 2017)  fentaNYL (SUBLIMAZE) injection 50 mcg (50 mcg Intravenous Given 06/05/20 2301)  ondansetron (ZOFRAN) injection 4 mg (4 mg Intravenous Given 06/05/20 2322)  iohexol (OMNIPAQUE) 350 MG/ML injection 75 mL (75 mLs Intravenous Contrast Given 06/06/20 0020)    ED Course  I have reviewed the triage vital signs and the nursing notes.  Pertinent labs & imaging results that were available during my care of the patient were reviewed by me and considered in my medical decision making (see chart for details).    MDM Rules/Calculators/A&P                          65yo male with history of CVA, hypertension, DM, polysubstance (cocaine and marijuana), TEE showing mitral valve calcification versus vegetation during admission 3/20, admission for AKI, abdominal pain, hypertensive urgence, acute metabolic encephalopathy 1/61-0/9 who initially presented to the ED with abdominal pain, nausea and vomiting and had seizure like activity at registration followed by altered mental  status.  On arrival to the room, he had a CT already completed showing concern for possible CVA.  Initial GCS 5, however appeared to be protecting airway, and given possible post-ictal period did not intubate immediately and continued to observe at bedside, contact family member and called Neurology Dr. Curly Shores emergently given concern for possible seizure-like episode in triage, possible new stroke on CT.  Dr. Curly Shores came to bedside and evaluated.  Patient noted to have non-epileptic type  shaking responding to external stimuli not consistent with seizure both on my and Dr. Lyn Records exam.  DDx for altered mental status includes toxic encephalopathy secondary to cocaine use, post-ictal period from true seizure, hypertensive encephalopathy, PRES, CVA, withdrawal from etoh, other metabolic abnormality.  No sign of DKA/hypoglycemia.   Did begin to become more responsive and again complain of abdominal pain. Given severe abdominal tenderness and pain/prior possible vegetation, report of right arm feeling funny in triage/new stroke, chest pain, ordered CTA dissection study.  Care signed out with CTA pending.  MRI brain to evaluate for acute stroke, anticipate likely admission.    Final Clinical Impression(s) / ED Diagnoses Final diagnoses:  Abdominal pain, unspecified abdominal location  Seizure-like activity ([place])  Cerebrovascular accident (CVA), unspecified mechanism ([place])  Encephalopathy  Hypertensive urgency  Non-intractable vomiting with nausea, unspecified vomiting type  Cocaine abuse [facility])    Rx / DC Orders ED Discharge Orders    None       Gareth Morgan, MD 06/06/20 [phone]

## 2020-06-05 NOTE — ED Provider Notes (Addendum)
Emergency Medicine Provider Triage Evaluation Note  William Acevedo , a 65 y.o. male  was evaluated in triage.  Pt complains of abdominal pain and vomiting today, per staff, checked in and then had a "seizure" talking throughout.  Review of Systems  Positive: Abdominal pain, vomiting, seizure? Negative: fever  Physical Exam  There were no vitals taken for this visit. Gen:   Rouses to painful stimuli    Resp:  Normal effort  Other:  Wakes to painful stimuli, answers some questions  Medical Decision Making  Medically screening exam initiated at 6:22 PM.  Appropriate orders placed.  CROSS JORGE was informed that the remainder of the evaluation will be completed by another provider, this initial triage assessment does not replace that evaluation, and the importance of remaining in the ED until their evaluation is complete.     William Fend, PA-C 06/05/20 1823    William Fend, PA-C 06/05/20 1931    William Core, MD 06/05/20 [phone]

## 2020-06-05 NOTE — Consult Note (Signed)
Neurology Consultation Reason for Consult: Seizure-like activity Requesting Physician: Gareth Morgan  CC: Abdominal pain  History is obtained from: Patient and chart review  HPI: William Acevedo is a 65 y.o. male with a past medical history significant for hypertension, hyperlipidemia, diabetes, ongoing polysubstance use (cocaine, tobacco, alcohol).  He has frequent ED visits for abdominal pain, which in prior notes that he self treats with cocaine.  He was most recently admitted 3/29 through 4/3 found to have an acute kidney injury in the setting of poor oral intake and dehydration, during that admission aspirin was held.  Per GI etiology of his streaks of blood was felt to be Mallory-Weiss tear.  He also had herpes zoster recently (3/26) for which he had been on Valtrex but this was discontinued due to AKI  Notably he was evaluated by neurology in mid March (Dr.Xu consult note 3/9, Dr. Leonie Man progress note 3/14), at which time he also presented with abdominal pain and was found to have acute multifactorial encephalopathy and.  He was found to have multifocal small or punctate acute and subacute infarcts involving the right cerebellum, right PCA, bilateral MCA/ACA territories without hemorrhagic conversion concerning for cardioembolic process.  CTA head and neck showed no large vessel stenosis or occlusion but greater than 50% stenosis of the proximal cavernous left ICA and approximately 50% stenosis of the mid cavernous right ICA.  TEE showed a potential vegetation on the mitral valve but his blood cultures remained negative, and he had no clinical signs of active endocarditis.  Additionally lower extremity Dopplers were negative, therefore his PFO was not felt to be clinically significant.  Reports he last used cocaine yesterday and has not used marijuana recently  LKW: 5/19 in the evening tPA given?: No, due to out of the window  Premorbid modified rankin scale: 1-2     1 - No significant  disability. Able to carry out all usual activities, despite some symptoms.     2 - Slight disability. Able to look after own affairs without assistance, but unable to carry out all previous activities.  ROS: Unable to assess secondary to patient's mental status   Past Medical History:  Diagnosis Date  . Carpal tunnel syndrome   . Depression   . Diabetes mellitus without complication ([place])   . Hypertensive urgency 04/14/2020  . Neuropathy    Past Surgical History:  Procedure Laterality Date  . BUBBLE STUDY  03/30/2020   Procedure: BUBBLE STUDY;  Surgeon: Jerline Pain, MD;  Location: [facility];  Service: Cardiovascular;;  . TEE WITHOUT CARDIOVERSION N/A 03/30/2020   Procedure: TRANSESOPHAGEAL ECHOCARDIOGRAM (TEE);  Surgeon: Jerline Pain, MD;  Location[facility] ENDOSCOPY;  Service: Cardiovascular;  Laterality: N/A;  . TESTICLE TORSION REDUCTION     Current Outpatient Medications  Medication Instructions  . acetaminophen (TYLENOL) 650 mg, Oral, Every 6 hours PRN  . amitriptyline (ELAVIL) 20 mg, Oral, Daily at bedtime  . amLODipine (NORVASC) 10 mg, Oral, Daily  . atorvastatin (LIPITOR) 20 mg, Oral, Daily  . blood glucose meter kit and supplies KIT Dispense based on patient and insurance preference. Use up to four times daily as directed. (FOR ICD-9 250.00, 250.01).  . calamine lotion Topical, As needed  . cyclobenzaprine (FLEXERIL) 5 mg, Oral, 3 times daily PRN  . dicyclomine (BENTYL) 20 mg, Oral, 3 times daily before meals  . docusate sodium (COLACE) 100 mg, Oral, 2 times daily  . empagliflozin (JARDIANCE) 25 mg, Oral, Daily  . famotidine (PEPCID) 20 mg, Oral,  Daily  . gabapentin (NEURONTIN) 300 mg, Oral, Daily at bedtime  . glipiZIDE (GLUCOTROL) 10 mg, Oral, Daily before breakfast  . hydrALAZINE (APRESOLINE) 50 mg, Oral, Every 8 hours  . HYDROmorphone (DILAUDID) 2 mg, Oral, Every 4 hours PRN  . insulin glargine (LANTUS) 12 Units, Subcutaneous, Daily  . Insulin Pen Needle 29G X 12MM  MISC Per instructions  . melatonin 12 mg, Oral, Daily at bedtime  . Multiple Vitamin (MULTIVITAMIN WITH MINERALS) TABS tablet 1 tablet, Oral, Daily  . NovoLOG FlexPen 5 Units, Subcutaneous, 3 times daily with meals  . ondansetron (ZOFRAN ODT) 4 mg, Oral, Every 8 hours PRN  . pantoprazole (PROTONIX) 40 mg, Oral, 2 times daily  . polyethylene glycol (MIRALAX / GLYCOLAX) 17 g, Oral, Daily PRN  . sucralfate (CARAFATE) 1 g, Oral, 2 times daily  . tadalafil (CIALIS) 20 mg, Oral, Daily PRN   No family history on file. Unable to assess secondary to patient's mental status / cooperation  Social History:  reports that he has been smoking cigarettes. He has been smoking about 0.50 packs per day. He has never used smokeless tobacco. He reports current alcohol use. He reports current drug use. Drug: Cocaine.   Exam: Current vital signs: BP (!) 172/89   Pulse 100   Resp (!) 26   SpO2 96%  Vital signs in last 24 hours: Pulse Rate:  [93-100] 100 (05/20 1946) Resp:  [20-32] 26 (05/20 1946) BP: (172-202)/(89-97) 172/89 (05/20 1946) SpO2:  [96 %-99 %] 96 % (05/20 1946)   Physical Exam  Constitutional: Appears acutely uncomfortable, intermittently retching Psych: Affect appropriate to situation, anxious and poorly interactive secondary to abdominal pain Eyes: No scleral injection HENT: No oropharyngeal obstruction.  MSK: no joint deformities.  Cardiovascular: Normal rate and regular rhythm.  Respiratory: Slightly tachypneic secondary to pain but otherwise does not appear to be in any respiratory distress.  No grossly audible wheezing GI: Soft.  No distension.  Tender to palpation with some guarding Skin: Warm dry and intact visible skin  Neuro: Mental Status: Patient is awake, alert, oriented to person, place, month, year, and situation. Patient is able to give limited history secondary acute pain No signs of aphasia or neglect though he has limited participation in examination due to  pain Cranial Nerves: II: Pupils are round, and reactive to light, left pupil perhaps half millimeter larger than the right but both reactive III,IV, VI: EOMI to tracking examiner V: Facial sensation is symmetric to eyelash brush VII: Facial movement is symmetric.  VIII: hearing is intact to voice XII: tongue is midline without atrophy or fasciculations.  Motor: Tone is normal. Bulk is normal.  He purposefully moves bilateral upper extremities to hold emesis bag.  Had spontaneous movement of the bilateral lower extremities that is grossly equal.  He does not participate in formal strength testing secondary to his severe abdominal pain.  He is slightly weaker on the right which appears to be baseline deficit per prior notes Sensory: Equally reactive to touch in all 4 extremities Deep Tendon Reflexes: 2+ in the biceps but noticeably brisker on the right than the left Plantars: Toes are mute bilaterally Cerebellar: Does not participate  NIHSS total 12 --though limited secondary to patient participation due to his severe abdominal pain Score breakdown: One-point for drowsiness, 1 for intermittently not following commands, 3 points for left arm drift, 3 points for right arm drift, 3 points for left leg drift, 3 points for right leg drift, one-point for not participating in  language exam (will not repeat, but appears to communicate well when discussing abdominal pain), one-point for mild dysarthria   I have reviewed labs in epic and the results pertinent to this consultation are: Glucose 331 Creatinine 1.12 (at his baseline, looks like recent AKI with peak creatinine 5.38 has resolved) CBC notable for mild anemia (hemoglobin 10.5, baseline 11-12)  Lab Results  Component Value Date   HGBA1C 10.2 (H) 06/06/2020    Lab Results  Component Value Date   CHOL 237 (H) 06/06/2020   HDL 106 06/06/2020   LDLCALC 120 (H) 06/06/2020   TRIG 57 06/06/2020   CHOLHDL 2.2 06/06/2020    I have  reviewed the images obtained: Head CT with age-indeterminate hypodensity in the right frontoparietal junction   Impression: This is a 65 year old male presenting with abdominal pain with multiple strokes risk factors as described above.  MRI brain shows recurrent multifocal embolic appearing strokes noted concerning for central embolic source.  He has ongoing cocaine use and vasculopathy may be the etiology.  It appears this aspirin has been held.  His diabetes and hyperlipidemia remain out of control as A1c resulted at 10.2% and LDL is 120.  UDS is also continuing to be positive for cocaine all of which continue put him at increased risk of stroke.  Doubt there has been much change in his vasculature within 2 months, however given the rapidity at which cardiac function can change, repeat echo is indicated.  Given his recent herpetic infection would also obtain lumbar puncture to rule out VZV vasculitis as an etiology of his strokes (note the pattern is somewhat atypical).    Regarding his shaking activity, unclear description of what happened while in triage.  On ED provider examination as well as my own his shakiness was felt not to be epileptic.  He is at risk of provoked seizure given his cocaine use.  However his cortical strokes could be a seizure focus and therefore will obtain EEG  Recommendations: -MRI brain without contrast completed on my recommendation  #Multifocal strokes -Agree with lipid panel and A1c as documented above -Repeat TTE -Please obtain blood pressures if temperature spikes above 38 -Lumbar puncture: cell counts in tubes 1 and 4, protein, glucose, HSV 1/2 PCR, VZV IgG levels, gram stain, culture) -No need to repeat vessel imaging at this time -Continue risk factor modification counseling (stopping cocaine, stopping cigarettes, diet, exercise, medication adherence) -Please consider restarting aspirin if safe to do so from a medical perspective, discontinuation reason not  clearly documented -Resume statin, increase dose to 40 mg daily -Stroke team will follow  #Seizure-like episode -EEG, routine  Lesleigh Noe MD-PhD [facility] [phone] Available 7 PM to 7 AM, outside of these hours please call Neurologist on call as listed on Amion.

## 2020-06-05 NOTE — ED Triage Notes (Signed)
Pt here d/t vomiting and abd pain. Pt had a witnessed seizure at ED registration lasted for 2 mins. Pt is disoriented to place, time, person and event. CBG 328

## 2020-06-06 ENCOUNTER — Observation Stay (HOSPITAL_COMMUNITY): Payer: No Typology Code available for payment source

## 2020-06-06 ENCOUNTER — Encounter (HOSPITAL_COMMUNITY): Payer: Self-pay | Admitting: Family Medicine

## 2020-06-06 ENCOUNTER — Emergency Department (HOSPITAL_COMMUNITY): Payer: No Typology Code available for payment source

## 2020-06-06 DIAGNOSIS — R569 Unspecified convulsions: Secondary | ICD-10-CM

## 2020-06-06 DIAGNOSIS — E785 Hyperlipidemia, unspecified: Secondary | ICD-10-CM | POA: Diagnosis present

## 2020-06-06 DIAGNOSIS — R29712 NIHSS score 12: Secondary | ICD-10-CM | POA: Diagnosis present

## 2020-06-06 DIAGNOSIS — E46 Unspecified protein-calorie malnutrition: Secondary | ICD-10-CM | POA: Diagnosis present

## 2020-06-06 DIAGNOSIS — N280 Ischemia and infarction of kidney: Secondary | ICD-10-CM | POA: Diagnosis present

## 2020-06-06 DIAGNOSIS — F1410 Cocaine abuse, uncomplicated: Secondary | ICD-10-CM

## 2020-06-06 DIAGNOSIS — I63443 Cerebral infarction due to embolism of bilateral cerebellar arteries: Secondary | ICD-10-CM | POA: Diagnosis present

## 2020-06-06 DIAGNOSIS — I160 Hypertensive urgency: Secondary | ICD-10-CM

## 2020-06-06 DIAGNOSIS — E1159 Type 2 diabetes mellitus with other circulatory complications: Secondary | ICD-10-CM | POA: Diagnosis not present

## 2020-06-06 DIAGNOSIS — R109 Unspecified abdominal pain: Secondary | ICD-10-CM

## 2020-06-06 DIAGNOSIS — I10 Essential (primary) hypertension: Secondary | ICD-10-CM | POA: Diagnosis present

## 2020-06-06 DIAGNOSIS — I6389 Other cerebral infarction: Secondary | ICD-10-CM | POA: Diagnosis present

## 2020-06-06 DIAGNOSIS — E1165 Type 2 diabetes mellitus with hyperglycemia: Secondary | ICD-10-CM | POA: Diagnosis present

## 2020-06-06 DIAGNOSIS — I2510 Atherosclerotic heart disease of native coronary artery without angina pectoris: Secondary | ICD-10-CM | POA: Diagnosis present

## 2020-06-06 DIAGNOSIS — I639 Cerebral infarction, unspecified: Secondary | ICD-10-CM | POA: Diagnosis present

## 2020-06-06 DIAGNOSIS — Z8673 Personal history of transient ischemic attack (TIA), and cerebral infarction without residual deficits: Secondary | ICD-10-CM | POA: Diagnosis not present

## 2020-06-06 DIAGNOSIS — F1210 Cannabis abuse, uncomplicated: Secondary | ICD-10-CM | POA: Diagnosis present

## 2020-06-06 DIAGNOSIS — F1910 Other psychoactive substance abuse, uncomplicated: Secondary | ICD-10-CM

## 2020-06-06 DIAGNOSIS — R54 Age-related physical debility: Secondary | ICD-10-CM | POA: Diagnosis present

## 2020-06-06 DIAGNOSIS — F17210 Nicotine dependence, cigarettes, uncomplicated: Secondary | ICD-10-CM | POA: Diagnosis present

## 2020-06-06 DIAGNOSIS — I63441 Cerebral infarction due to embolism of right cerebellar artery: Secondary | ICD-10-CM | POA: Diagnosis present

## 2020-06-06 DIAGNOSIS — G8929 Other chronic pain: Secondary | ICD-10-CM | POA: Diagnosis present

## 2020-06-06 DIAGNOSIS — Z794 Long term (current) use of insulin: Secondary | ICD-10-CM

## 2020-06-06 DIAGNOSIS — Z681 Body mass index (BMI) 19 or less, adult: Secondary | ICD-10-CM | POA: Diagnosis not present

## 2020-06-06 DIAGNOSIS — R471 Dysarthria and anarthria: Secondary | ICD-10-CM | POA: Diagnosis present

## 2020-06-06 DIAGNOSIS — G9341 Metabolic encephalopathy: Secondary | ICD-10-CM | POA: Diagnosis present

## 2020-06-06 DIAGNOSIS — N179 Acute kidney failure, unspecified: Secondary | ICD-10-CM | POA: Diagnosis present

## 2020-06-06 DIAGNOSIS — Z20822 Contact with and (suspected) exposure to covid-19: Secondary | ICD-10-CM | POA: Diagnosis present

## 2020-06-06 DIAGNOSIS — B0229 Other postherpetic nervous system involvement: Secondary | ICD-10-CM | POA: Diagnosis present

## 2020-06-06 LAB — SARS CORONAVIRUS 2 (TAT 6-24 HRS): SARS Coronavirus 2: NEGATIVE

## 2020-06-06 LAB — GLUCOSE, CAPILLARY
Glucose-Capillary: 224 mg/dL — ABNORMAL HIGH (ref 70–99)
Glucose-Capillary: 305 mg/dL — ABNORMAL HIGH (ref 70–99)
Glucose-Capillary: 76 mg/dL (ref 70–99)
Glucose-Capillary: 288 mg/dL — ABNORMAL HIGH (ref 70–99)

## 2020-06-06 LAB — LIPID PANEL
Cholesterol: 237 mg/dL — ABNORMAL HIGH (ref 0–200)
HDL: 106 mg/dL (ref >40)
LDL Cholesterol: 120 mg/dL — ABNORMAL HIGH (ref 0–99)
Total CHOL/HDL Ratio: 2.2 RATIO
Triglycerides: 57 mg/dL (ref <150)
VLDL: 11 mg/dL (ref 0–40)

## 2020-06-06 LAB — TROPONIN I (HIGH SENSITIVITY): Troponin I (High Sensitivity): 16 ng/L (ref <18)

## 2020-06-06 LAB — ECHOCARDIOGRAM LIMITED
Area-P 1/2: 4.21 cm2
Height: 66 in
Weight: 1975.32 oz

## 2020-06-06 LAB — HIV ANTIBODY (ROUTINE TESTING W REFLEX): HIV Screen 4th Generation wRfx: NONREACTIVE

## 2020-06-06 LAB — CBG MONITORING, ED: Glucose-Capillary: 279 mg/dL — ABNORMAL HIGH (ref 70–99)

## 2020-06-06 LAB — HEMOGLOBIN A1C
Hgb A1c MFr Bld: 10.2 % — ABNORMAL HIGH (ref 4.8–5.6)
Mean Plasma Glucose: 246.04 mg/dL

## 2020-06-06 MED ORDER — HYDROMORPHONE HCL 2 MG PO TABS
2.0000 mg | ORAL_TABLET | Freq: Four times a day (QID) | ORAL | Status: DC | PRN
  Administered 2020-06-06 – 2020-06-07 (×2): 2 mg via ORAL
  Filled 2020-06-06 (×3): qty 1

## 2020-06-06 MED ORDER — ACETAMINOPHEN 160 MG/5ML PO SOLN: 650.0000 mg | ORAL | Status: DC | PRN

## 2020-06-06 MED ORDER — IOHEXOL 350 MG/ML SOLN
75.0000 mL | Freq: Once | INTRAVENOUS | Status: AC | PRN
  Administered 2020-06-06: 75 mL via INTRAVENOUS

## 2020-06-06 MED ORDER — STROKE: EARLY STAGES OF RECOVERY BOOK
Freq: Once | Status: AC
  Filled 2020-06-06: qty 1

## 2020-06-06 MED ORDER — INSULIN ASPART 100 UNIT/ML IJ SOLN
0.0000 [IU] | INTRAMUSCULAR | Status: DC
  Administered 2020-06-06: 5 [IU] via SUBCUTANEOUS
  Administered 2020-06-06: 3 [IU] via SUBCUTANEOUS
  Administered 2020-06-06: 7 [IU] via SUBCUTANEOUS
  Administered 2020-06-07: 3 [IU] via SUBCUTANEOUS
  Administered 2020-06-07: 2 [IU] via SUBCUTANEOUS
  Administered 2020-06-07: 5 [IU] via SUBCUTANEOUS
  Administered 2020-06-07: 2 [IU] via SUBCUTANEOUS
  Administered 2020-06-07 – 2020-06-08 (×2): 3 [IU] via SUBCUTANEOUS
  Administered 2020-06-08: 1 [IU] via SUBCUTANEOUS
  Administered 2020-06-08: 2 [IU] via SUBCUTANEOUS
  Administered 2020-06-08: 3 [IU] via SUBCUTANEOUS
  Administered 2020-06-08: 5 [IU] via SUBCUTANEOUS
  Administered 2020-06-09: 2 [IU] via SUBCUTANEOUS
  Administered 2020-06-09: 3 [IU] via SUBCUTANEOUS
  Administered 2020-06-09: 1 [IU] via SUBCUTANEOUS

## 2020-06-06 MED ORDER — ACETAMINOPHEN 325 MG PO TABS
650.0000 mg | ORAL_TABLET | ORAL | Status: DC | PRN
  Administered 2020-06-06: 650 mg via ORAL
  Filled 2020-06-06: qty 2

## 2020-06-06 MED ORDER — SODIUM CHLORIDE 0.9 % IV SOLN: INTRAVENOUS | Status: AC

## 2020-06-06 MED ORDER — ONDANSETRON HCL 4 MG/2ML IJ SOLN
4.0000 mg | Freq: Four times a day (QID) | INTRAMUSCULAR | Status: DC | PRN
  Administered 2020-06-06 – 2020-06-08 (×3): 4 mg via INTRAVENOUS
  Filled 2020-06-06 (×3): qty 2

## 2020-06-06 MED ORDER — ACETAMINOPHEN 650 MG RE SUPP: 650.0000 mg | RECTAL | Status: DC | PRN

## 2020-06-06 MED ORDER — ASPIRIN EC 81 MG PO TBEC
81.0000 mg | DELAYED_RELEASE_TABLET | Freq: Every day | ORAL | Status: DC
  Administered 2020-06-06 – 2020-06-09 (×4): 81 mg via ORAL
  Filled 2020-06-06 (×4): qty 1

## 2020-06-06 MED ORDER — FENTANYL CITRATE (PF) 100 MCG/2ML IJ SOLN
50.0000 ug | INTRAMUSCULAR | Status: AC | PRN
Start: 2020-06-06 — End: 2020-06-06
  Administered 2020-06-06 (×2): 50 ug via INTRAVENOUS
  Filled 2020-06-06 (×2): qty 2

## 2020-06-06 MED ORDER — ENOXAPARIN SODIUM 40 MG/0.4ML IJ SOSY: 40.0000 mg | PREFILLED_SYRINGE | Freq: Every day | INTRAMUSCULAR | Status: DC

## 2020-06-06 MED ORDER — LABETALOL HCL 5 MG/ML IV SOLN: 10.0000 mg | INTRAVENOUS | Status: DC | PRN

## 2020-06-06 MED ORDER — SENNOSIDES-DOCUSATE SODIUM 8.6-50 MG PO TABS
1.0000 | ORAL_TABLET | Freq: Every evening | ORAL | Status: DC | PRN
  Administered 2020-06-07: 1 via ORAL
  Filled 2020-06-06: qty 1

## 2020-06-06 MED ORDER — ATORVASTATIN CALCIUM 40 MG PO TABS
40.0000 mg | ORAL_TABLET | Freq: Every day | ORAL | Status: DC
  Administered 2020-06-06 – 2020-06-08 (×3): 40 mg via ORAL
  Filled 2020-06-06 (×3): qty 1

## 2020-06-06 MED ORDER — INSULIN GLARGINE 100 UNIT/ML ~~LOC~~ SOLN
6.0000 [IU] | Freq: Every day | SUBCUTANEOUS | Status: DC
  Administered 2020-06-06 (×2): 6 [IU] via SUBCUTANEOUS
  Filled 2020-06-06 (×3): qty 0.06

## 2020-06-06 NOTE — Progress Notes (Signed)
This patient was admitted this morning.  He is a 65 year old male history of stroke type 2 diabetes and hypertension admitted with seizure-like activity while in triage, MRI with multifocal acute ischemia involving both cerebral hemispheres and right cerebellum, recent stroke in March 2022 TEE with the possible PFO.  Urine drug screen positive for cocaine.  LDL is 120, triglycerides 57, hemoglobin A1c 10.2 Transthoracic echo done results pending  EEG ordered neurology consulted.

## 2020-06-06 NOTE — Evaluation (Signed)
Physical Therapy Evaluation Patient Details Name: William Acevedo MRN: [id number] DOB: [birth year] Today's Date: 06/06/2020   History of Present Illness  65 y/o male presented to ED on 5/20 with complaints of abdominal pain and vomiting. Had witnessed seizure in ED lasting 2 minutes then had AMS. MRI found multifocal acute ischemia within both cerebral hemispheres and R cerebellum. PMH includes: HTN, HLD, CVA, uncontrolled DM II, renal infarct, major depressive disorder, polysubstance abuse (cocaine and marijuana), multiple recent admissions (3/9-3/14, 3/17-3/20, 3/22-3/23, and 3/26) for abdominal pain, shingles, and multifocal acute infarcts.  Clinical Impression  PTA, patient lives with brother and reports independence with mobility. Patient concerned about current functional status and personal situation, requested rehab to assist with return to independence and drug rehab for assistance with drug use cessation. Patient currently functioning at [facility] for OOB mobility with no AD. Patient presents with impaired coordination, generalized weakness, decreased activity tolerance, and impaired balance. Patient will benefit from skilled PT services during acute stay to address listed deficits. Recommend CIR at discharge for intensive therapies to assist with maximizing functional independence.     Follow Up Recommendations CIR    Equipment Recommendations  Other (comment) (TBD)    Recommendations for Other Services       Precautions / Restrictions Precautions Precautions: Fall Restrictions Weight Bearing Restrictions: No      Mobility  Bed Mobility Overal bed mobility: Modified Independent                  Transfers Overall transfer level: Needs assistance Equipment used: None Transfers: Sit to/from Stand Sit to Stand: Min guard         General transfer comment: min guard for safety  Ambulation/Gait Ambulation/Gait assistance: Min assist Gait Distance (Feet): 40  Feet Assistive device: None Gait Pattern/deviations: Step-through pattern;Decreased stride length;Ataxic;Drifts right/left Gait velocity: decreased   General Gait Details: Ataxic gait with minA for balance throughout. Patient with functional weakness noted during ambulation. Varying BOS.  Stairs            Wheelchair Mobility    Modified Rankin (Stroke Patients Only) Modified Rankin (Stroke Patients Only) Pre-Morbid Rankin Score: No significant disability Modified Rankin: Moderately severe disability     Balance Overall balance assessment: Needs assistance Sitting-balance support: No upper extremity supported;Feet supported Sitting balance-Leahy Scale: Good     Standing balance support: No upper extremity supported;During functional activity Standing balance-Leahy Scale: Poor Standing balance comment: requires minA for balance during ambulation                             Pertinent Vitals/Pain Pain Assessment: 0-10 Pain Score: 6  Pain Location: abdomen Pain Descriptors / Indicators: Aching;Discomfort Pain Intervention(s): Limited activity within patient's tolerance;Monitored during session;Repositioned    Home Living Family/patient expects to be discharged to:: Private residence Living Arrangements: Other relatives (brother) Available Help at Discharge: Family Type of Home: House Home Access: Ramped entrance     Home Layout: One level Home Equipment: Bedside commode;Shower seat;Cane - single point;Crutches;Wheelchair - manual      Prior Function Level of Independence: Independent               Hand Dominance        Extremity/Trunk Assessment   Upper Extremity Assessment Upper Extremity Assessment: Defer to OT evaluation    Lower Extremity Assessment Lower Extremity Assessment: Generalized weakness;RLE deficits/detail;LLE deficits/detail RLE Coordination: decreased fine motor;decreased gross motor LLE Coordination: decreased gross  motor;decreased  fine motor    Cervical / Trunk Assessment Cervical / Trunk Assessment: Normal  Communication   Communication: No difficulties  Cognition Arousal/Alertness: Awake/alert Behavior During Therapy: WFL for tasks assessed/performed Overall Cognitive Status: Within Functional Limits for tasks assessed                                        General Comments      Exercises     Assessment/Plan    PT Assessment Patient needs continued PT services  PT Problem List Decreased strength;Decreased activity tolerance;Decreased balance;Decreased mobility;Decreased coordination;Decreased safety awareness       PT Treatment Interventions DME instruction;Gait training;Functional mobility training;Therapeutic activities;Therapeutic exercise;Balance training;Patient/family education    PT Goals (Current goals can be found in the Care Plan section)  Acute Rehab PT Goals Patient Stated Goal: to get rehab PT Goal Formulation: With patient Time For Goal Achievement: 06/20/20 Potential to Achieve Goals: Good    Frequency Min 4X/week   Barriers to discharge        Co-evaluation               AM-PAC PT "6 Clicks" Mobility  Outcome Measure Help needed turning from your back to your side while in a flat bed without using bedrails?: None Help needed moving from lying on your back to sitting on the side of a flat bed without using bedrails?: None Help needed moving to and from a bed to a chair (including a wheelchair)?: A Little Help needed standing up from a chair using your arms (e.g., wheelchair or bedside chair)?: A Little Help needed to walk in hospital room?: A Little Help needed climbing 3-5 steps with a railing? : A Little 6 Click Score: 20    End of Session Equipment Utilized During Treatment: Gait belt Activity Tolerance: Patient tolerated treatment well Patient left: in bed;with call bell/phone within reach;with bed alarm set Nurse Communication:  Mobility status PT Visit Diagnosis: Unsteadiness on feet (R26.81);Muscle weakness (generalized) (M62.81);Ataxic gait (R26.0)    Time: 7673-4193 PT Time Calculation (min) (ACUTE ONLY): 27 min   Charges:   PT Evaluation $PT Eval Moderate Complexity: 1 Mod PT Treatments $Therapeutic Activity: 8-22 mins        Giavana Rooke A. Dan Humphreys PT, DPT Acute Rehabilitation Services Pager [phone] Office [phone]   Viviann Spare 06/06/2020, 10:37 AM

## 2020-06-06 NOTE — Progress Notes (Signed)
Occupational Therapy Evaluation Patient Details Name: William Acevedo MRN: [id number] DOB: January 31, 1955 Today's Date: 06/06/2020    History of Present Illness 65 y/o male presented to ED on 5/20 with complaints of abdominal pain and vomiting. Had witnessed seizure in ED lasting 2 minutes then had AMS. MRI found multifocal acute ischemia within both cerebral hemispheres and R cerebellum. PMH includes: HTN, HLD, CVA, uncontrolled DM II, renal infarct, major depressive disorder, polysubstance abuse (cocaine and marijuana), multiple recent admissions (3/9-3/14, 3/17-3/20, 3/22-3/23, and 3/26) for abdominal pain, shingles, and multifocal acute infarcts.   Clinical Impression   Pt requiring encouragement for participation; endorses being indep or mod indep with ADL's and mobility while living at home with his brother but was limited on elaborating on PLOF or social history/support. Currently pt presents with L UE functional deficits of mild weakness and coordination, (?ataxia), mod indep bed mobility, min guard up to min A for functional transition to standing and min A LB ADL's. Pt declining further participation with session d/t pain in abdomen and being 'too tired to do anything else'. Pt will need benefit from post acute therapy at time of d/c with pt's preference for CIR; OT will continue to follow acutely for further functional assessment and intervention to maximize indep and safety at time of d/c.     Follow Up Recommendations  Supervision/Assistance - 24 hour;CIR    Equipment Recommendations   (will continue to assess needs)    Recommendations for Other Services Rehab consult     Precautions / Restrictions Precautions Precautions: Fall Precaution Comments: possible shingles? nursing aware Restrictions Weight Bearing Restrictions: No      Mobility Bed Mobility Overal bed mobility: Modified Independent                  Transfers Overall transfer level: Needs  assistance Equipment used: None Transfers: Sit to/from Stand Sit to Stand: Min assist         General transfer comment: fluctuating min guard to min A d/t decreased willingness for particiaption.    Balance Overall balance assessment: Needs assistance Sitting-balance support: No upper extremity supported;Feet supported Sitting balance-Leahy Scale: Good     Standing balance support: No upper extremity supported;During functional activity Standing balance-Leahy Scale: Poor Standing balance comment: requires minA for balance during ambulation                           ADL either performed or assessed with clinical judgement   ADL Overall ADL's : Needs assistance/impaired Eating/Feeding: Supervision/ safety;Bed level   Grooming: Set up;Bed level;Wash/dry hands           Upper Body Dressing : Minimal assistance;Sitting   Lower Body Dressing: Moderate assistance;Sit to/from stand;Sitting/lateral leans Lower Body Dressing Details (indicate cue type and reason): decreased L UE FM/GM coordination leading to difficulty wtih control and management of sock for simulated threading, difficulty with management of LE's and trunk control in sitting Toilet Transfer: Minimal assistance;RW             General ADL Comments: pt declining further participation wtih session until nursing provides him with pain meds, although sleeping soundly in bed prior to OT's exit. nursing made aware of request     Vision         Perception     Praxis      Pertinent Vitals/Pain Pain Assessment: 0-10 Pain Score: 5  Pain Location: abdomen Pain Descriptors / Indicators: Aching;Discomfort Pain Intervention(s): Limited activity  within patient's tolerance     Hand Dominance Right   Extremity/Trunk Assessment Upper Extremity Assessment Upper Extremity Assessment: LUE deficits/detail LUE Deficits / Details: presenting at this time with ?ataxia; limited significantly d/t pt's  willingness to participation in assessment d/t reports of severe abdominal pain and fatigue although visuall showing no discomfort LUE Sensation: decreased light touch LUE Coordination: decreased gross motor;decreased fine motor   Lower Extremity Assessment Lower Extremity Assessment: Defer to PT evaluation RLE Coordination: decreased fine motor;decreased gross motor LLE Coordination: decreased gross motor;decreased fine motor   Cervical / Trunk Assessment Cervical / Trunk Assessment: Normal   Communication Communication Communication: No difficulties   Cognition Arousal/Alertness: Awake/alert Behavior During Therapy: WFL for tasks assessed/performed Overall Cognitive Status: Within Functional Limits for tasks assessed                                     General Comments       Exercises     Shoulder Instructions      Home Living Family/patient expects to be discharged to:: Private residence Living Arrangements: Other relatives;Other (Comment) (brother) Available Help at Discharge: Family Type of Home: House Home Access: Ramped entrance     Home Layout: One level     Bathroom Shower/Tub: Chief Strategy Officer: Standard     Home Equipment: Bedside commode;Shower seat;Cane - single point;Crutches;Wheelchair - manual   Additional Comments: reports he wears glassess to read/watch tv and drive but "not all the time"- glasses not in room      Prior Functioning/Environment Level of Independence: Independent        Comments: prior admission endorses working with power tools        OT Problem List: Decreased strength;Decreased range of motion;Impaired balance (sitting and/or standing);Decreased activity tolerance;Decreased coordination;Decreased safety awareness;Decreased knowledge of use of DME or AE;Decreased knowledge of precautions      OT Treatment/Interventions: Therapeutic exercise;Neuromuscular education;Self-care/ADL training;DME  and/or AE instruction;Therapeutic activities;Balance training    OT Goals(Current goals can be found in the care plan section) Acute Rehab OT Goals Patient Stated Goal: to go inpatient OT Goal Formulation: With patient Time For Goal Achievement: 06/20/20 Potential to Achieve Goals: Good ADL Goals Pt Will Perform Upper Body Dressing: with modified independence Pt Will Perform Lower Body Dressing: with modified independence Pt Will Transfer to Toilet: with modified independence Pt Will Perform Toileting - Clothing Manipulation and hygiene: with modified independence Pt Will Perform Tub/Shower Transfer: with modified independence  OT Frequency: Min 2X/week   Barriers to D/C:            Co-evaluation              AM-PAC OT "6 Clicks" Daily Activity     Outcome Measure Help from another person eating meals?: A Little Help from another person taking care of personal grooming?: A Little Help from another person toileting, which includes using toliet, bedpan, or urinal?: A Little Help from another person bathing (including washing, rinsing, drying)?: A Little Help from another person to put on and taking off regular upper body clothing?: A Little Help from another person to put on and taking off regular lower body clothing?: A Lot 6 Click Score: 17   End of Session Nurse Communication: Mobility status;Patient requests pain meds  Activity Tolerance: Patient limited by pain Patient left: in bed;with bed alarm set;with call bell/phone within reach  OT Visit Diagnosis: Unsteadiness  on feet (R26.81);Ataxia, unspecified (R27.0)                Time: 3112-1624 OT Time Calculation (min): 18 min Charges:  OT General Charges $OT Visit: 1 Visit OT Evaluation $OT Eval Low Complexity: 1 Low   Decie Verne OTR/L acute rehab services Office: [phone]  06/06/2020, 1:58 PM

## 2020-06-06 NOTE — Progress Notes (Signed)
  Echocardiogram 2D Echocardiogram has been performed.  Delcie Roch 06/06/2020, 9:52 AM

## 2020-06-06 NOTE — Progress Notes (Signed)
Inpatient Rehab Admissions Coordinator Note:   Per PT/OT recommendations, pt was screened for CIR candidacy by Wolfgang Phoenix, MS, CCC-SLP.  At this time we are recommending an inpatient rehab consult.  AC will place consult order per protocol.   Please contact me with questions.    Wolfgang Phoenix, MS, CCC-SLP Admissions Coordinator [phone] 06/06/20 3:14 PM

## 2020-06-06 NOTE — Progress Notes (Signed)
Pt alert and aware sitting up min bed eating lunch. He states he is doing okay but he would never turn down prayer. The chaplain offered caring and supportive presence, prayers and blessings.

## 2020-06-06 NOTE — Consult Note (Incomplete)
Physical Medicine and Rehabilitation Consult Reason for Consult: Altered mental status Referring Physician: Dr. Jacki Cones   HPI: William Acevedo is a 65 y.o. 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  TEE showed small mitral valve vegetation versus calcification and possible PFO.  He had no clinical signs of active endocarditis and negative blood cultures...  Per chart review patient lives with his brother.  1 level home ramped entrance.  Independent prior to admission.  Presented 06/05/2020 with initial abdominal pain with associated nausea, nonbloody vomiting and loose stools as well as question seizure.  Admission chemistry sodium 133 chloride 96 glucose 351, alcohol negative, troponin 21, urine drug screen positive cocaine.  MRI of the brain showed multifocal acute ischemia within both cerebral hemispheres and right cerebellum.  Distribution most consistent with a central embolic process.  No hemorrhage or mass-effect.  CT angiogram of the chest no thoracic or abdominal aortic aneurysm or dissection.  There was a right inferior renal pole infarction with thrombosis of the diminutive accessory right renal artery.  Currently maintained on aspirin for CVA prophylaxis.  Neurology follow-up work-up presently ongoing with EEG pending.  Echocardiogram with ejection fraction of 60 to 93% grade 1 diastolic dysfunction.  Therapy evaluations completed due to patient decreased functional mobility recommendations of physical medicine rehab consult.   Review of Systems  Constitutional: Negative for chills and fever.  HENT: Negative for hearing loss.   Eyes: Negative for blurred vision and double vision.  Respiratory: Negative for cough and shortness of  breath.   Cardiovascular: Negative for chest pain, palpitations and leg swelling.  Gastrointestinal: Positive for abdominal pain, nausea and vomiting.  Genitourinary: Negative for dysuria, flank pain and hematuria.  Musculoskeletal: Positive for joint pain and myalgias.  Skin: Negative for rash.  Neurological: Positive for weakness.  Psychiatric/Behavioral: Positive for depression. The patient has insomnia.   All other systems reviewed and are negative.  Past Medical History:  Diagnosis Date  . Carpal tunnel syndrome   . Depression   . Diabetes mellitus without complication ([place])   . Hypertensive urgency 04/14/2020  . Neuropathy    Past Surgical History:  Procedure Laterality Date  . BUBBLE STUDY  03/30/2020   Procedure: BUBBLE STUDY;  Surgeon: Jerline Pain, MD;  Location: [place];  Service: Cardiovascular;;  . TEE WITHOUT CARDIOVERSION N/A 03/30/2020   Procedure: TRANSESOPHAGEAL ECHOCARDIOGRAM (TEE);  Surgeon: Jerline Pain, MD;  Location: [facility] ENDOSCOPY;  Service: Cardiovascular;  Laterality: N/A;  . TESTICLE TORSION REDUCTION     History reviewed. No pertinent family history. Social History:  reports that he has been smoking cigarettes. He has been smoking about 0.50 packs per day. He has never used smokeless tobacco. He reports current alcohol use. He reports current drug use. Drug: Cocaine. Allergies:  Allergies  Allergen Reactions  . Trazodone     Other reaction(s): Priapism  . Lisinopril Swelling    angioedema  . Omeprazole     Other reaction(s): ANGIOEDEMA OF LIPS, Angioedema of tongue.   Medications Prior to Admission  Medication Sig Dispense Refill  . acetaminophen (TYLENOL) 325 MG tablet Take 2 tablets (650 mg total) by mouth every 6 (six) hours as needed for mild pain (or Fever >/= 101).    [name] amitriptyline (ELAVIL) 10 MG tablet Take 20 mg by  mouth at bedtime.    [name] amLODipine (NORVASC) 10 MG tablet Take 1 tablet (10 mg total) by mouth daily. 90 tablet 0  .  atorvastatin (LIPITOR) 20 MG tablet Take 20 mg by mouth daily.    . blood glucose meter kit and supplies KIT Dispense based on patient and insurance preference. Use up to four times daily as directed. (FOR ICD-9 250.00, 250.01). 1 each 0  . calamine lotion Apply topically as needed for itching. 120 mL 0  . cyclobenzaprine (FLEXERIL) 5 MG tablet Take 1 tablet (5 mg total) by mouth 3 (three) times daily as needed for muscle spasms. 10 tablet 0  . dicyclomine (BENTYL) 20 MG tablet Take 1 tablet (20 mg total) by mouth 3 (three) times daily before meals. 90 tablet 0  . docusate sodium (COLACE) 100 MG capsule Take 1 capsule (100 mg total) by mouth 2 (two) times daily. 10 capsule 0  . empagliflozin (JARDIANCE) 25 MG TABS tablet Take 25 mg by mouth daily.    . famotidine (PEPCID) 20 MG tablet Take 1 tablet (20 mg total) by mouth daily. 30 tablet 3  . gabapentin (NEURONTIN) 600 MG tablet Take 0.5 tablets (300 mg total) by mouth at bedtime. 15 tablet 0  . glipiZIDE (GLUCOTROL) 10 MG tablet Take 1 tablet (10 mg total) by mouth daily before breakfast. (Patient taking differently: Take 10 mg by mouth daily.) 30 tablet 0  . hydrALAZINE (APRESOLINE) 50 MG tablet Take 1 tablet (50 mg total) by mouth every 8 (eight) hours. 270 tablet 0  . HYDROmorphone (DILAUDID) 2 MG tablet Take 1 tablet (2 mg total) by mouth every 4 (four) hours as needed for severe pain. 12 tablet 0  . insulin aspart (NOVOLOG FLEXPEN) 100 UNIT/ML FlexPen Inject 5 Units into the skin 3 (three) times daily with meals. 15 mL 11  . insulin glargine (LANTUS) 100 UNIT/ML Solostar Pen Inject 12 Units into the skin daily. 15 mL 0  . Insulin Pen Needle 29G X 12MM MISC Per instructions 200 each 0  . melatonin 3 MG TABS tablet Take 12 mg by mouth at bedtime.    . Multiple Vitamin (MULTIVITAMIN WITH MINERALS) TABS tablet Take 1 tablet by mouth daily. 90 tablet 0  . ondansetron (ZOFRAN ODT) 4 MG disintegrating tablet Take 1 tablet (4 mg total) by mouth every  8 (eight) hours as needed for nausea or vomiting. 10 tablet 0  . pantoprazole (PROTONIX) 40 MG tablet Take 1 tablet (40 mg total) by mouth 2 (two) times daily. 60 tablet 0  . polyethylene glycol (MIRALAX / GLYCOLAX) 17 g packet Take 17 g by mouth daily as needed for moderate constipation. 14 each 0  . sucralfate (CARAFATE) 1 g tablet Take 1 tablet (1 g total) by mouth 2 (two) times daily. 60 tablet 0  . tadalafil (CIALIS) 10 MG tablet Take 20 mg by mouth daily as needed for erectile dysfunction.      Home: Home Living Family/patient expects to be discharged to:: Private residence Living Arrangements: Other relatives,Other (Comment) (brother) Available Help at Discharge: Family Type of Home: House Home Access: Ramped entrance [place]: One level Bathroom Shower/Tub: Chiropodist: [place]: Bedside commode,Shower seat,Cane - single point,Crutches,Wheelchair - manual Additional Comments: reports he wears glassess to read/watch tv and drive but "not all the time"- glasses not in room  Functional History: Prior Function Level of Independence: Independent Comments: prior admission endorses working with power tools Functional Status:  Mobility: Bed Mobility Overal  bed mobility: Modified Independent Transfers Overall transfer level: Needs assistance Equipment used: None Transfers: Sit to/from Stand Sit to Stand: Min assist General transfer comment: fluctuating min guard to min A d/t decreased willingness for particiaption. Ambulation/Gait Ambulation/Gait assistance: Min assist Gait Distance (Feet): 40 Feet Assistive device: None Gait Pattern/deviations: Step-through pattern,Decreased stride length,Ataxic,Drifts right/left General Gait Details: Ataxic gait with minA for balance throughout. Patient with functional weakness noted during ambulation. Varying BOS. Gait velocity: decreased    ADL: ADL Overall ADL's : Needs  assistance/impaired Eating/Feeding: Supervision/ safety,Bed level Grooming: Set up,Bed level,Wash/dry hands Upper Body Dressing : Minimal assistance,Sitting Lower Body Dressing: Moderate assistance,Sit to/from stand,Sitting/lateral leans Lower Body Dressing Details (indicate cue type and reason): decreased L UE FM/GM coordination leading to difficulty wtih control and management of sock for simulated threading, difficulty with management of LE's and trunk control in sitting Toilet Transfer: Minimal assistance,RW General ADL Comments: pt declining further participation wtih session until nursing provides him with pain meds, although sleeping soundly in bed prior to OT's exit. nursing made aware of request  Cognition: Cognition Overall Cognitive Status: Within Functional Limits for tasks assessed Orientation Level: Oriented X4 Cognition Arousal/Alertness: Awake/alert Behavior During Therapy: WFL for tasks assessed/performed Overall Cognitive Status: Within Functional Limits for tasks assessed  Blood pressure (!) 98/53, pulse 83, temperature 98.3 F (36.8 C), temperature source Oral, resp. rate 14, height 5' 6"  (1.676 m), weight 56 kg, SpO2 100 %. Physical Exam Neurological:     Comments: Patient is alert in no acute distress.  Makes eye contact with examiner.  Oriented x3 and follows commands.  No aphasia noted.     Results for orders placed or performed during the hospital encounter of 06/05/20 (from the past 24 hour(s))  CBG monitoring, ED     Status: Abnormal   Collection Time: 06/05/20  7:27 PM  Result Value Ref Range   Glucose-Capillary 283 (H) 70 - 99 mg/dL  Troponin I (High Sensitivity)     Status: Abnormal   Collection Time: 06/05/20  7:41 PM  Result Value Ref Range   Troponin I (High Sensitivity) 21 (H) <18 ng/L  Urinalysis, Routine w reflex microscopic Urine, Clean Catch     Status: Abnormal   Collection Time: 06/05/20 10:59 PM  Result Value Ref Range   Color, Urine  STRAW (A) YELLOW   APPearance CLEAR CLEAR   Specific Gravity, Urine 1.011 1.005 - 1.030   pH 9.0 (H) 5.0 - 8.0   Glucose, UA >=500 (A) NEGATIVE mg/dL   Hgb urine dipstick NEGATIVE NEGATIVE   Bilirubin Urine NEGATIVE NEGATIVE   Ketones, ur 5 (A) NEGATIVE mg/dL   Protein, ur 100 (A) NEGATIVE mg/dL   Nitrite NEGATIVE NEGATIVE   Leukocytes,Ua NEGATIVE NEGATIVE   RBC / HPF 0-5 0 - 5 RBC/hpf   WBC, UA 0-5 0 - 5 WBC/hpf   Bacteria, UA RARE (A) NONE SEEN  Urine rapid drug screen (hosp performed)     Status: Abnormal   Collection Time: 06/05/20 10:59 PM  Result Value Ref Range   Opiates NONE DETECTED NONE DETECTED   Cocaine POSITIVE (A) NONE DETECTED   Benzodiazepines NONE DETECTED NONE DETECTED   Amphetamines NONE DETECTED NONE DETECTED   Tetrahydrocannabinol NONE DETECTED NONE DETECTED   Barbiturates NONE DETECTED NONE DETECTED  Troponin I (High Sensitivity)     Status: None   Collection Time: 06/05/20 11:11 PM  Result Value Ref Range   Troponin I (High Sensitivity) 16 <18 ng/L  HIV Antibody (routine testing w  rflx)     Status: None   Collection Time: 06/06/20  3:10 AM  Result Value Ref Range   HIV Screen 4th Generation wRfx Non Reactive Non Reactive  Hemoglobin A1c     Status: Abnormal   Collection Time: 06/06/20  3:10 AM  Result Value Ref Range   Hgb A1c MFr Bld 10.2 (H) 4.8 - 5.6 %   Mean Plasma Glucose 246.04 mg/dL  Lipid panel     Status: Abnormal   Collection Time: 06/06/20  3:10 AM  Result Value Ref Range   Cholesterol 237 (H) 0 - 200 mg/dL   Triglycerides 57 <150 mg/dL   HDL 106 >40 mg/dL   Total CHOL/HDL Ratio 2.2 RATIO   VLDL 11 0 - 40 mg/dL   LDL Cholesterol 120 (H) 0 - 99 mg/dL  SARS CORONAVIRUS 2 (TAT 6-24 HRS) Nasopharyngeal Nasopharyngeal Swab     Status: None   Collection Time: 06/06/20  4:03 AM   Specimen: Nasopharyngeal Swab  Result Value Ref Range   SARS Coronavirus 2 NEGATIVE NEGATIVE  CBG monitoring, ED     Status: Abnormal   Collection Time: 06/06/20   4:47 AM  Result Value Ref Range   Glucose-Capillary 279 (H) 70 - 99 mg/dL  Glucose, capillary     Status: Abnormal   Collection Time: 06/06/20 11:38 AM  Result Value Ref Range   Glucose-Capillary 224 (H) 70 - 99 mg/dL  Glucose, capillary     Status: Abnormal   Collection Time: 06/06/20  5:00 PM  Result Value Ref Range   Glucose-Capillary 305 (H) 70 - 99 mg/dL   CT Head Wo Contrast  Result Date: 06/05/2020 CLINICAL DATA:  Seizure EXAM: CT HEAD WITHOUT CONTRAST TECHNIQUE: Contiguous axial images were obtained from the base of the skull through the vertex without intravenous contrast. COMPARISON:  CT 04/14/2020, 03/25/2020, MRI 03/25/2020 FINDINGS: Brain: No hemorrhage or intracranial mass. Small chronic infarcts in the right cerebellum and right white matter. Age indeterminate subcortical infarct at the right frontal parietal junction, series 2, image 28. Mild chronic small vessel ischemic changes of the white matter. Chronic left occipital infarct. Stable ventricle size Vascular: No hyperdense vessels.  Carotid vascular calcification Skull: Normal. Negative for fracture or focal lesion. Sinuses/Orbits: No acute finding. Other: None IMPRESSION: 1. New age indeterminate suspected subcortical infarct at the right frontal parietal junction. 2. Otherwise stable chronic small vessel ischemic changes of the white matter and chronic infarcts within the left occipital lobe, right cerebellum and right white matter. Electronically Signed   By: Donavan Foil M.D.   On: 06/05/2020 19:04   MR BRAIN WO CONTRAST  Result Date: 06/06/2020 CLINICAL DATA:  Seizure EXAM: MRI HEAD WITHOUT CONTRAST TECHNIQUE: Multiplanar, multiecho pulse sequences of the brain and surrounding structures were obtained without intravenous contrast. COMPARISON:  03/25/2020 FINDINGS: Brain: Multifocal abnormal diffusion restriction within both cerebral hemispheres and the right cerebellum, new since the prior study. The largest area of acute  ischemia is in the anterior right parietal lobe. No acute or chronic hemorrhage. There is multifocal hyperintense T2-weighted signal within the white matter. Parenchymal volume and CSF spaces are normal. There is an old right cerebellar infarct. The midline structures are normal. Vascular: Major flow voids are preserved. Skull and upper cervical spine: Normal calvarium and skull base. Visualized upper cervical spine and soft tissues are normal. Sinuses/Orbits:No paranasal sinus fluid levels or advanced mucosal thickening. No mastoid or middle ear effusion. Normal orbits. IMPRESSION: Multifocal acute ischemia within both cerebral hemispheres and  right cerebellum. Distribution is most consistent with a central (cardiac or aortic) embolic process. No hemorrhage or mass effect. Electronically Signed   By: Ulyses Jarred M.D.   On: 06/06/2020 01:10   CT Angio Chest/Abd/Pel for Dissection W and/or Wo Contrast  Result Date: 06/06/2020 CLINICAL DATA:  Abdominal pain, aortic dissection suspected. EXAM: CT ANGIOGRAPHY CHEST, ABDOMEN AND PELVIS TECHNIQUE: Non-contrast CT of the chest was initially obtained. Multidetector CT imaging through the chest, abdomen and pelvis was performed using the standard protocol during bolus administration of intravenous contrast. Multiplanar reconstructed images and MIPs were obtained and reviewed to evaluate the vascular anatomy. CONTRAST:  1m OMNIPAQUE IOHEXOL 350 MG/ML SOLN COMPARISON:  CT abdomen pelvis 04/14/2020 FINDINGS: CTA CHEST FINDINGS Cardiovascular: Preferential opacification of the thoracic aorta. No evidence of thoracic aortic aneurysm or dissection. Severe calcified and noncalcified atherosclerotic plaque of the thoracic aorta. At least moderate stenosis of the proximal left subclavian artery due to calcified and noncalcified atherosclerotic plaque (7:23). Normal heart size. No significant pericardial effusion. Four-vessel coronary artery calcifications. Mediastinum/Nodes:  No enlarged mediastinal, hilar, or axillary lymph nodes. Thyroid gland, trachea, and esophagus demonstrate no significant findings. Lungs/Pleura: Biapical pleural/pulmonary scarring. Mild biapical paraseptal emphysematous changes. Right lower lobe subpleural micronodule (8:81). Right lower lobe calcified subpleural micronodule (8:96). Calcified micronodule along the left major fissure in the left lower lobe (8: 108). No pulmonary mass. No focal consolidation. No pleural effusion. No pneumothorax. Musculoskeletal: No chest wall abnormality. No suspicious lytic or blastic osseous lesions. No acute displaced fracture. Review of the MIP images confirms the above findings. CTA ABDOMEN AND PELVIS FINDINGS VASCULAR Aorta: Severe calcified and noncalcified atherosclerotic plaque. Normal caliber aorta without aneurysm, dissection, vasculitis or significant stenosis. Celiac: At least moderate atherosclerotic plaque at its origin. Patent without evidence of aneurysm, dissection, vasculitis or significant stenosis. SMA: At least moderate atherosclerotic plaque at its origin. Patent without evidence of aneurysm, dissection, vasculitis or significant stenosis. Renals: 2 right renal arteries are noted within the inferior pole supplied by the diminutive of the 2 arteries. Is diminutive artery is likely filling retrograde with the origin stenosed by atherosclerotic plaque. 2 left renal arteries are noted. Mild-to-moderate atherosclerotic plaque. No aneurysm, dissection, vasculitis, fibromuscular dysplasia or significant stenosis. IMA: Patent without evidence of aneurysm, dissection, vasculitis or significant stenosis. Inflow: Severe atherosclerotic plaque with the right common iliac artery measuring at the upper limits of normal (1.5 cm). Otherwise patent without evidence of aneurysm, dissection, vasculitis or significant stenosis. Veins: No obvious venous abnormality within the limitations of this arterial phase study. Review of  the MIP images confirms the above findings. NON-VASCULAR Hepatobiliary: No adrenal nodule bilaterally. Bilateral kidneys enhance symmetrically. Redemonstration of a punctate right nephrolithiasis. No hydronephrosis. No hydroureter. The urinary bladder is unremarkable. Pancreas: No focal lesion. Normal pancreatic contour. No surrounding inflammatory changes. No main pancreatic ductal dilatation. Spleen: Normal in size without focal abnormality. Adrenals/Urinary Tract: No adrenal nodule bilaterally. There is a markedly delayed nephrogram of the inferior right renal pole. Remainder of bilateral kidneys enhance symmetrically. Bilateral subcentimeter hypodensities are too small to characterize. No hydronephrosis. No hydroureter. The urinary bladder is distended with urine. Stomach/Bowel: Stomach is within normal limits. No evidence of bowel wall thickening or dilatation. Question few scattered colonic diverticula of the left colon. Appendix appears normal. Lymphatic: No definite lymphadenopathy. Reproductive: Prominent poorly visualized prostate gland. Other: No intraperitoneal free fluid. No intraperitoneal free gas. No organized fluid collection. Musculoskeletal: No abdominal wall hernia or abnormality. No suspicious lytic or blastic osseous lesions.  No acute displaced fracture. Review of the MIP images confirms the above findings. IMPRESSION: 1. Right inferior renal pole infarction with thrombosis of the diminutive accessory right renal artery. 2. No thoracic or abdominal aorta aneurysm or dissection in a patient with severe atherosclerosis. Aortic Atherosclerosis (ICD10-I70.0) with four-vessel coronary artery calcifications. 3. Ectatic right common iliac artery (1.5 cm). 4. Other imaging findings of potential clinical significance: Nonobstructive punctate right nephrolithiasis. Emphysema (ICD10-J43.9). These results were called by telephone at the time of interpretation on 06/06/2020 at 12:52 am to provider [facility] , who verbally acknowledged these results. Electronically Signed   By: Iven Finn M.D.   On: 06/06/2020 00:56   ECHOCARDIOGRAM LIMITED  Result Date: 06/06/2020    ECHOCARDIOGRAM LIMITED REPORT   Patient Name:   William Acevedo Date of Exam: 06/06/2020 Medical Rec #:  [id number]           Height:       66.0 in Accession #:    [id number]          Weight:       123.5 lb Date of Birth:  01-30-1955           BSA:          1.629 m Patient Age:    77 years            BP:           107/71 mmHg Patient Gender: M                   HR:           88 bpm. Exam Location:  Inpatient Procedure: Limited Echo, Limited Color Doppler and Cardiac Doppler Indications:    stroke  History:        Patient has prior history of Echocardiogram examinations, most                 recent 03/30/2020. Risk Factors:Current Smoker, Diabetes,                 Hypertension and polysubstance abuse.  Sonographer:    Johny Chess Referring Phys: [id number] [place]  1. Left ventricular ejection fraction, by estimation, is 60 to 65%. The left ventricle has normal function. The left ventricle has no regional wall motion abnormalities. There is severe left ventricular hypertrophy. Left ventricular diastolic parameters  are consistent with Grade I diastolic dysfunction (impaired relaxation).  2. Right ventricular systolic function is normal. The right ventricular size is normal. There is normal pulmonary artery systolic pressure.  3. Mild mitral valve regurgitation. No evidence of mitral stenosis.  4. The tricuspid valve is abnormal.  5. The aortic valve is tricuspid. There is moderate calcification of the aortic valve. There is moderate thickening of the aortic valve. FINDINGS  Left Ventricle: Left ventricular ejection fraction, by estimation, is 60 to 65%. The left ventricle has normal function. The left ventricle has no regional wall motion abnormalities. The left ventricular internal cavity size was small. There  is severe left ventricular hypertrophy. Left ventricular diastolic parameters are consistent with Grade I diastolic dysfunction (impaired relaxation). Right Ventricle: The right ventricular size is normal. No increase in right ventricular wall thickness. Right ventricular systolic function is normal. There is normal pulmonary artery systolic pressure. The tricuspid regurgitant velocity is 2.34 m/s, and  with an assumed right atrial pressure of 3 mmHg, the estimated right ventricular systolic pressure is 67.1 mmHg. Pericardium: There is no evidence of  pericardial effusion. Mitral Valve: Mild mitral valve regurgitation. No evidence of mitral valve stenosis. Tricuspid Valve: Apparent ruptured chordae, looks to be attached to the septal leaflet. The tricuspid valve is abnormal. Aortic Valve: The aortic valve is tricuspid. There is moderate calcification of the aortic valve. There is moderate thickening of the aortic valve. There is moderate aortic valve annular calcification. Pulmonic Valve: The pulmonic valve was not well visualized. Pulmonic valve regurgitation is not visualized. No evidence of pulmonic stenosis. IVC IVC diam: 1.10 cm MITRAL VALVE                TRICUSPID VALVE MV Area (PHT): 4.21 cm     TR Peak grad:   21.9 mmHg MV Decel Time: 180 msec     TR Vmax:        234.00 cm/s MV E velocity: 69.40 cm/s MV A velocity: 111.00 cm/s MV E/A ratio:  0.63 Carlyle Dolly MD Electronically signed by Carlyle Dolly MD Signature Date/Time: 06/06/2020/2:12:24 PM    Final     ***  Lavon Paganini Nimra Puccinelli, PA-C 06/06/2020

## 2020-06-06 NOTE — ED Notes (Signed)
Report called to RN receiving pt in room. 

## 2020-06-06 NOTE — Plan of Care (Signed)

## 2020-06-06 NOTE — Care Management (Signed)
Notified [facility] of admission, confirmation number [phone]

## 2020-06-06 NOTE — H&P (Signed)
History and Physical    William Acevedo KDX:[id number] DOB: 1955-11-05 DOA: 06/05/2020  PCP: Pcp, No   Patient coming from: Home   Chief Complaint: Abdominal pain   HPI: William Acevedo is a 65 y.o. male with medical history significant for hypertension, insulin-dependent diabetes mellitus, history of CVA, now presenting to the emergency department for evaluation of abdominal pain and was noted to have seizure-like activity in triage.  Patient reports progressive generalized abdominal pain throughout the day associated with nausea, nonbloody vomiting, and loose stool without blood.  Denies any associated fevers or chills and denies any chest pain or palpitations.  He now denies headache or focal numbness or weakness though he had reported a funny sensation in his right arm earlier while in triage. Reports rarely drinking alcohol and denies history of withdrawal.   Patient was admitted in March 2022 when he was found to have acute and subacute ischemic CVA involving multiple vascular territories bilaterally, had no large vessel occlusion on CTA head and neck at that time, and underwent TEE without left atrial or left atrial appendage thrombus but possible PFO and a mobile echodensity attached to the mitral annular calcification.  Blood cultures were negative at that time and he had negative lower extremity venous Dopplers.   ED Course: Upon arrival to the ED, patient is found to be afebrile, saturating well on room air, slightly tachycardic, and hypertensive.  EKG features a sinus rhythm.  Head CT concerning for new age-indeterminate suspected subcortical infarction at the right frontoparietal junction.  CTA chest/abdomen/pelvis notable for severe atherosclerosis, right inferior renal pole infarction, and thrombosis of diminutive accessory right renal artery.  Chemistry panel notable for glucose of 351.  Patient had witnessed seizure-like activity in the emergency department followed by  decreased LOC, now alert and fully oriented. Neurology was consulted by ED physician.   Review of Systems:  All other systems reviewed and apart from HPI, are negative.  Past Medical History:  Diagnosis Date  . Carpal tunnel syndrome   . Depression   . Diabetes mellitus without complication ([place])   . Hypertensive urgency 04/14/2020  . Neuropathy     Past Surgical History:  Procedure Laterality Date  . BUBBLE STUDY  03/30/2020   Procedure: BUBBLE STUDY;  Surgeon: Jerline Pain, MD;  Location: [place];  Service: Cardiovascular;;  . TEE WITHOUT CARDIOVERSION N/A 03/30/2020   Procedure: TRANSESOPHAGEAL ECHOCARDIOGRAM (TEE);  Surgeon: Jerline Pain, MD;  Location: [facility] ENDOSCOPY;  Service: Cardiovascular;  Laterality: N/A;  . TESTICLE TORSION REDUCTION      Social History:   reports that he has been smoking cigarettes. He has been smoking about 0.50 packs per day. He has never used smokeless tobacco. He reports current alcohol use. He reports current drug use. Drug: Cocaine.  Allergies  Allergen Reactions  . Trazodone     Other reaction(s): Priapism  . Lisinopril Swelling    angioedema  . Omeprazole     Other reaction(s): ANGIOEDEMA OF LIPS, Angioedema of tongue.    History reviewed. No pertinent family history.   Prior to Admission medications   Medication Sig Start Date End Date Taking? Authorizing Provider  acetaminophen (TYLENOL) 325 MG tablet Take 2 tablets (650 mg total) by mouth every 6 (six) hours as needed for mild pain (or Fever >/= 101). 04/05/20   Oswald Hillock, MD  amitriptyline (ELAVIL) 10 MG tablet Take 20 mg by mouth at bedtime.    [provider]  amLODipine (NORVASC) 10 MG  tablet Take 1 tablet (10 mg total) by mouth daily. 04/20/20 07/19/20  Kayleen Memos, DO  atorvastatin (LIPITOR) 20 MG tablet Take 20 mg by mouth daily.    [provider]  blood glucose meter kit and supplies KIT Dispense based on patient and insurance preference. Use up to  four times daily as directed. (FOR ICD-9 250.00, 250.01). 09/13/19   Isla Pence, MD  calamine lotion Apply topically as needed for itching. 04/19/20   Kayleen Memos, DO  cyclobenzaprine (FLEXERIL) 5 MG tablet Take 1 tablet (5 mg total) by mouth 3 (three) times daily as needed for muscle spasms. 03/30/20   Arrien, Jimmy Picket, MD  dicyclomine (BENTYL) 20 MG tablet Take 1 tablet (20 mg total) by mouth 3 (three) times daily before meals. 04/07/20   Tedd Sias, PA  docusate sodium (COLACE) 100 MG capsule Take 1 capsule (100 mg total) by mouth 2 (two) times daily. 04/05/20   Oswald Hillock, MD  empagliflozin (JARDIANCE) 25 MG TABS tablet Take 25 mg by mouth daily.    [provider]  famotidine (PEPCID) 20 MG tablet Take 1 tablet (20 mg total) by mouth daily. 04/05/20 08/03/20  Oswald Hillock, MD  gabapentin (NEURONTIN) 600 MG tablet Take 0.5 tablets (300 mg total) by mouth at bedtime. 04/19/20 05/19/20  Kayleen Memos, DO  glipiZIDE (GLUCOTROL) 10 MG tablet Take 1 tablet (10 mg total) by mouth daily before breakfast. Patient taking differently: Take 10 mg by mouth daily. 08/24/19   [name], PA-C  hydrALAZINE (APRESOLINE) 50 MG tablet Take 1 tablet (50 mg total) by mouth every 8 (eight) hours. 04/19/20 07/18/20  Kayleen Memos, DO  HYDROmorphone (DILAUDID) 2 MG tablet Take 1 tablet (2 mg total) by mouth every 4 (four) hours as needed for severe pain. 04/11/20   Sherwood Gambler, MD  insulin aspart (NOVOLOG FLEXPEN) 100 UNIT/ML FlexPen Inject 5 Units into the skin 3 (three) times daily with meals. 04/05/20   Oswald Hillock, MD  insulin glargine (LANTUS) 100 UNIT/ML Solostar Pen Inject 12 Units into the skin daily. 04/19/20   Kayleen Memos, DO  Insulin Pen Needle 29G X 12MM MISC Per instructions 04/08/19   [name], MD  melatonin 3 MG TABS tablet Take 12 mg by mouth at bedtime.    [provider]  Multiple Vitamin (MULTIVITAMIN WITH MINERALS) TABS tablet Take 1 tablet by mouth  daily. 04/20/20 07/19/20  Kayleen Memos, DO  ondansetron (ZOFRAN ODT) 4 MG disintegrating tablet Take 1 tablet (4 mg total) by mouth every 8 (eight) hours as needed for nausea or vomiting. 04/11/20   Sherwood Gambler, MD  pantoprazole (PROTONIX) 40 MG tablet Take 1 tablet (40 mg total) by mouth 2 (two) times daily. 04/19/20 05/19/20  Kayleen Memos, DO  polyethylene glycol (MIRALAX / GLYCOLAX) 17 g packet Take 17 g by mouth daily as needed for moderate constipation. 03/30/20   Arrien, Jimmy Picket, MD  sucralfate (CARAFATE) 1 g tablet Take 1 tablet (1 g total) by mouth 2 (two) times daily. 04/05/20 05/05/20  Oswald Hillock, MD  tadalafil (CIALIS) 10 MG tablet Take 20 mg by mouth daily as needed for erectile dysfunction.    [provider]    Physical Exam: Vitals:   06/05/20 2330 06/05/20 2345 06/06/20 0000 06/06/20 0130  BP: (!) 184/101 (!) 186/102 (!) 174/104 (!) 186/103  Pulse: (!) 105 (!) 108 (!) 107 (!) 109  Resp: 18 19 19  [name])  26  Temp:      TempSrc:      SpO2: 98% 98% 97% 97%  Weight:      Height:        Constitutional: NAD, calm  Eyes: PERTLA, lids and conjunctivae normal ENMT: Mucous membranes are moist. Posterior pharynx clear of any exudate or lesions.   Neck:  supple, no masses  Respiratory:  no wheezing, no crackles. No accessory muscle use.  Cardiovascular: S1 & S2 heard, regular rate and rhythm. No extremity edema.   Abdomen: No distension,  soft. Bowel sounds active.  Musculoskeletal: no clubbing / cyanosis. No joint deformity upper and lower extremities.   Skin: no significant rashes, lesions, ulcers. Warm, dry, well-perfused. Neurologic: CN 2-12 grossly intact. Sensation intact. Moving all extremities, poorly cooperative with exam.  Psychiatric: Alert. Oriented to person, place, and situation.    Labs and Imaging on Admission: I have personally reviewed following labs and imaging studies  CBC: Recent Labs  Lab 06/05/20 1822 06/05/20 1843  WBC 6.1  --    NEUTROABS 4.0  --   HGB 10.5* 10.5*  HCT 32.7* 31.0*  MCV 90.1  --   PLT 341  --    Basic Metabolic Panel: Recent Labs  Lab 06/05/20 1822 06/05/20 1843  NA 133* 137  K 4.0 4.2  CL 96* 99  CO2 27  --   GLUCOSE 351* 331*  BUN 10 11  CREATININE 1.12 0.90  CALCIUM 9.3  --    GFR: Estimated Creatinine Clearance: 64 mL/min (by C-G formula based on SCr of 0.9 mg/dL). Liver Function Tests: Recent Labs  Lab 06/05/20 1822  AST 29  ALT 18  ALKPHOS 62  BILITOT 0.6  PROT 7.8  ALBUMIN 3.7   Recent Labs  Lab 06/05/20 1822  LIPASE 26   No results for input(s): AMMONIA in the last 168 hours. Coagulation Profile: No results for input(s): INR, PROTIME in the last 168 hours. Cardiac Enzymes: No results for input(s): CKTOTAL, CKMB, CKMBINDEX, TROPONINI in the last 168 hours. BNP (last 3 results) No results for input(s): PROBNP in the last 8760 hours. HbA1C: No results for input(s): HGBA1C in the last 72 hours. CBG: Recent Labs  Lab 06/05/20 1824 06/05/20 1927  GLUCAP 323* 283*   Lipid Profile: No results for input(s): CHOL, HDL, LDLCALC, TRIG, CHOLHDL, LDLDIRECT in the last 72 hours. Thyroid Function Tests: No results for input(s): TSH, T4TOTAL, FREET4, T3FREE, THYROIDAB in the last 72 hours. Anemia Panel: No results for input(s): VITAMINB12, FOLATE, FERRITIN, TIBC, IRON, RETICCTPCT in the last 72 hours. Urine analysis:    Component Value Date/Time   COLORURINE STRAW (A) 06/05/2020 2259   APPEARANCEUR CLEAR 06/05/2020 2259   LABSPEC 1.011 06/05/2020 2259   PHURINE 9.0 (H) 06/05/2020 2259   GLUCOSEU >=500 (A) 06/05/2020 2259   HGBUR NEGATIVE 06/05/2020 2259   BILIRUBINUR NEGATIVE 06/05/2020 2259   KETONESUR 5 (A) 06/05/2020 2259   PROTEINUR 100 (A) 06/05/2020 2259   NITRITE NEGATIVE 06/05/2020 2259   LEUKOCYTESUR NEGATIVE 06/05/2020 2259   Sepsis Labs: @LABRCNTIP (procalcitonin:4,lacticidven:4) )No results found for this or any previous visit (from the past 240  hour(s)).   Radiological Exams on Admission: CT Head Wo Contrast  Result Date: 06/05/2020 CLINICAL DATA:  Seizure EXAM: CT HEAD WITHOUT CONTRAST TECHNIQUE: Contiguous axial images were obtained from the base of the skull through the vertex without intravenous contrast. COMPARISON:  CT 04/14/2020, 03/25/2020, MRI 03/25/2020 FINDINGS: Brain: No hemorrhage or intracranial mass. Small chronic infarcts in the right  cerebellum and right white matter. Age indeterminate subcortical infarct at the right frontal parietal junction, series 2, image 28. Mild chronic small vessel ischemic changes of the white matter. Chronic left occipital infarct. Stable ventricle size Vascular: No hyperdense vessels.  Carotid vascular calcification Skull: Normal. Negative for fracture or focal lesion. Sinuses/Orbits: No acute finding. Other: None IMPRESSION: 1. New age indeterminate suspected subcortical infarct at the right frontal parietal junction. 2. Otherwise stable chronic small vessel ischemic changes of the white matter and chronic infarcts within the left occipital lobe, right cerebellum and right white matter. Electronically Signed   By: Donavan Foil M.D.   On: 06/05/2020 19:04   MR BRAIN WO CONTRAST  Result Date: 06/06/2020 CLINICAL DATA:  Seizure EXAM: MRI HEAD WITHOUT CONTRAST TECHNIQUE: Multiplanar, multiecho pulse sequences of the brain and surrounding structures were obtained without intravenous contrast. COMPARISON:  03/25/2020 FINDINGS: Brain: Multifocal abnormal diffusion restriction within both cerebral hemispheres and the right cerebellum, new since the prior study. The largest area of acute ischemia is in the anterior right parietal lobe. No acute or chronic hemorrhage. There is multifocal hyperintense T2-weighted signal within the white matter. Parenchymal volume and CSF spaces are normal. There is an old right cerebellar infarct. The midline structures are normal. Vascular: Major flow voids are preserved. Skull  and upper cervical spine: Normal calvarium and skull base. Visualized upper cervical spine and soft tissues are normal. Sinuses/Orbits:No paranasal sinus fluid levels or advanced mucosal thickening. No mastoid or middle ear effusion. Normal orbits. IMPRESSION: Multifocal acute ischemia within both cerebral hemispheres and right cerebellum. Distribution is most consistent with a central (cardiac or aortic) embolic process. No hemorrhage or mass effect. Electronically Signed   By: Ulyses Jarred M.D.   On: 06/06/2020 01:10   CT Angio Chest/Abd/Pel for Dissection W and/or Wo Contrast  Result Date: 06/06/2020 CLINICAL DATA:  Abdominal pain, aortic dissection suspected. EXAM: CT ANGIOGRAPHY CHEST, ABDOMEN AND PELVIS TECHNIQUE: Non-contrast CT of the chest was initially obtained. Multidetector CT imaging through the chest, abdomen and pelvis was performed using the standard protocol during bolus administration of intravenous contrast. Multiplanar reconstructed images and MIPs were obtained and reviewed to evaluate the vascular anatomy. CONTRAST:  73m OMNIPAQUE IOHEXOL 350 MG/ML SOLN COMPARISON:  CT abdomen pelvis 04/14/2020 FINDINGS: CTA CHEST FINDINGS Cardiovascular: Preferential opacification of the thoracic aorta. No evidence of thoracic aortic aneurysm or dissection. Severe calcified and noncalcified atherosclerotic plaque of the thoracic aorta. At least moderate stenosis of the proximal left subclavian artery due to calcified and noncalcified atherosclerotic plaque (7:23). Normal heart size. No significant pericardial effusion. Four-vessel coronary artery calcifications. Mediastinum/Nodes: No enlarged mediastinal, hilar, or axillary lymph nodes. Thyroid gland, trachea, and esophagus demonstrate no significant findings. Lungs/Pleura: Biapical pleural/pulmonary scarring. Mild biapical paraseptal emphysematous changes. Right lower lobe subpleural micronodule (8:81). Right lower lobe calcified subpleural micronodule  (8:96). Calcified micronodule along the left major fissure in the left lower lobe (8: 108). No pulmonary mass. No focal consolidation. No pleural effusion. No pneumothorax. Musculoskeletal: No chest wall abnormality. No suspicious lytic or blastic osseous lesions. No acute displaced fracture. Review of the MIP images confirms the above findings. CTA ABDOMEN AND PELVIS FINDINGS VASCULAR Aorta: Severe calcified and noncalcified atherosclerotic plaque. Normal caliber aorta without aneurysm, dissection, vasculitis or significant stenosis. Celiac: At least moderate atherosclerotic plaque at its origin. Patent without evidence of aneurysm, dissection, vasculitis or significant stenosis. SMA: At least moderate atherosclerotic plaque at its origin. Patent without evidence of aneurysm, dissection, vasculitis or significant stenosis. Renals:  2 right renal arteries are noted within the inferior pole supplied by the diminutive of the 2 arteries. Is diminutive artery is likely filling retrograde with the origin stenosed by atherosclerotic plaque. 2 left renal arteries are noted. Mild-to-moderate atherosclerotic plaque. No aneurysm, dissection, vasculitis, fibromuscular dysplasia or significant stenosis. IMA: Patent without evidence of aneurysm, dissection, vasculitis or significant stenosis. Inflow: Severe atherosclerotic plaque with the right common iliac artery measuring at the upper limits of normal (1.5 cm). Otherwise patent without evidence of aneurysm, dissection, vasculitis or significant stenosis. Veins: No obvious venous abnormality within the limitations of this arterial phase study. Review of the MIP images confirms the above findings. NON-VASCULAR Hepatobiliary: No adrenal nodule bilaterally. Bilateral kidneys enhance symmetrically. Redemonstration of a punctate right nephrolithiasis. No hydronephrosis. No hydroureter. The urinary bladder is unremarkable. Pancreas: No focal lesion. Normal pancreatic contour. No  surrounding inflammatory changes. No main pancreatic ductal dilatation. Spleen: Normal in size without focal abnormality. Adrenals/Urinary Tract: No adrenal nodule bilaterally. There is a markedly delayed nephrogram of the inferior right renal pole. Remainder of bilateral kidneys enhance symmetrically. Bilateral subcentimeter hypodensities are too small to characterize. No hydronephrosis. No hydroureter. The urinary bladder is distended with urine. Stomach/Bowel: Stomach is within normal limits. No evidence of bowel wall thickening or dilatation. Question few scattered colonic diverticula of the left colon. Appendix appears normal. Lymphatic: No definite lymphadenopathy. Reproductive: Prominent poorly visualized prostate gland. Other: No intraperitoneal free fluid. No intraperitoneal free gas. No organized fluid collection. Musculoskeletal: No abdominal wall hernia or abnormality. No suspicious lytic or blastic osseous lesions. No acute displaced fracture. Review of the MIP images confirms the above findings. IMPRESSION: 1. Right inferior renal pole infarction with thrombosis of the diminutive accessory right renal artery. 2. No thoracic or abdominal aorta aneurysm or dissection in a patient with severe atherosclerosis. Aortic Atherosclerosis (ICD10-I70.0) with four-vessel coronary artery calcifications. 3. Ectatic right common iliac artery (1.5 cm). 4. Other imaging findings of potential clinical significance: Nonobstructive punctate right nephrolithiasis. Emphysema (ICD10-J43.9). These results were called by telephone at the time of interpretation on 06/06/2020 at 12:52 am to provider [facility] , who verbally acknowledged these results. Electronically Signed   By: Iven Finn M.D.   On: 06/06/2020 00:56    EKG: Independently reviewed. Sinus rhythm.   Assessment/Plan   1. Acute ischemic CVA - Presents with abdominal and N/V, had seizure-like activity in triage, and found to have multifocal acute  ischemia involving both cerebral hemispheres and right cerebellum on MRI  - He does not have known hx of a fib, had TEE in March 2022 with mobile echodensity attached to mitral annulus, and possible PFO with negative LE dopplers  - Neurology consulting and much appreciated, planning for repeat TTE, culture if febrile, LP (for HSV, VZV IgG, gram stain, and culture)  - Resume statin and ASA   2. Seizure-like activity  - Seizure like activity noted in triage  - Patient denies hx of seizures  - MRI with multifocal infarcts as above  - Check EEG    3. Insulin-dependent DM  - A1c was 10.7% in March 2022  - Continue CBG checks and insulin    4. Hypertension  - Permit HTN in acute-phase ischemic CVA then gradually lower    5. Polysubstance abuse  - UDS positive for cocaine  - Cessation encouraged   6. Renal infarction  - Right inferior renal pole infarction with thrombosis of diminutive accessory right renal artery noted on CTA in ED  - No hx  of atrial fibrillation; had TEE in March without left atrial or appendage thrombus but notable for possible vegetation on mitral valve; possibly from atheroma or undiagnosed PAF  - Continue cardiac monitoring    7. Abdominal pain  - Possibly related to renal infarct, no peritoneal signs    DVT prophylaxis: Lovenox  Code Status: Full  Level of Care: Level of care: Telemetry Medical Family Communication: None present  Disposition Plan:  Patient is from: home  Anticipated d/c is to: TBD Anticipated d/c date is: 5/22 or 06/08/20 Patient currently: pending EEG, therapy assessments, neurology recommendations   Consults called: neurology  Admission status: Observation     Vianne Bulls, MD [facility]  06/06/2020, 2:06 AM

## 2020-06-06 NOTE — Progress Notes (Signed)
Pt notified staff at approximately 16:00 that his gold cross & chain necklace had been missing since MRI last night. An effort by multiple staff members to search for necklace proved to be unfruitful. During search I found $19 cash, 2 debit cards & an appointment reminder card in patient's pants pocket. With nurse Waldon Merl and tech Essence, we listed items on a security envelope, placed items in envelope and had patient sign slip acknowledging that this was done and he wanted valuables in safe with security. Patient was very adamant that we find gold chain necklace and threatened to file a complaint against [place] personally for losing items. I stated to pt that none of [place] were present when necklace went missing and that we would continue our search. Took items in envelope to security office and placed slip in patient's paper chart.

## 2020-06-06 NOTE — Progress Notes (Signed)
STROKE TEAM PROGRESS NOTE   INTERVAL HISTORY No acute events.  Her reports stomach pain caused him to present to ED. He reports weakness and clumsiness on left side is new.  We discussed his stroke diagnosis, ongoing work up and plan of care. His questions were addressed. No visitors at bedside.  There is history of recent shingles mentioned in the chart patient is unable to give me any details and I did not find documentation in the event.  Dr. Ashley Royalty is going to try to reach family members to confirm this. Vitals:   06/06/20 0330 06/06/20 0430 06/06/20 0541 06/06/20 0801  BP: (!) 182/100 (!) 185/99 (!) 177/99 107/71  Pulse: (!) 111 (!) 115 (!) 111 90  Resp: 20 20 19 18   Temp:   98 F (36.7 C) 98.2 F (36.8 C)  TempSrc:   Oral   SpO2: 98% 98% 98%   Weight:      Height:       CBC:  Recent Labs  Lab 06/05/20 1822 06/05/20 1843  WBC 6.1  --   NEUTROABS 4.0  --   HGB 10.5* 10.5*  HCT 32.7* 31.0*  MCV 90.1  --   PLT 341  --    Basic Metabolic Panel:  Recent Labs  Lab 06/05/20 1822 06/05/20 1843  NA 133* 137  K 4.0 4.2  CL 96* 99  CO2 27  --   GLUCOSE 351* 331*  BUN 10 11  CREATININE 1.12 0.90  CALCIUM 9.3  --    Lipid Panel:  Recent Labs  Lab 06/06/20 0310  CHOL 237*  TRIG 57  HDL 106  CHOLHDL 2.2  VLDL 11  LDLCALC 06/08/20*   HgbA1c:  Recent Labs  Lab 06/06/20 0310  HGBA1C 10.2*   Urine Drug Screen:  Recent Labs  Lab 06/05/20 2259  LABOPIA NONE DETECTED  COCAINSCRNUR POSITIVE*  LABBENZ NONE DETECTED  AMPHETMU NONE DETECTED  THCU NONE DETECTED  LABBARB NONE DETECTED    Alcohol Level  Recent Labs  Lab 06/05/20 1823  ETH <10    IMAGING past 24 hours CT Head Wo Contrast  Result Date: 06/05/2020 CLINICAL DATA:  Seizure EXAM: CT HEAD WITHOUT CONTRAST TECHNIQUE: Contiguous axial images were obtained from the base of the skull through the vertex without intravenous contrast. COMPARISON:  CT 04/14/2020, 03/25/2020, MRI 03/25/2020 FINDINGS: Brain:  No hemorrhage or intracranial mass. Small chronic infarcts in the right cerebellum and right white matter. Age indeterminate subcortical infarct at the right frontal parietal junction, series 2, image 28. Mild chronic small vessel ischemic changes of the white matter. Chronic left occipital infarct. Stable ventricle size Vascular: No hyperdense vessels.  Carotid vascular calcification Skull: Normal. Negative for fracture or focal lesion. Sinuses/Orbits: No acute finding. Other: None IMPRESSION: 1. New age indeterminate suspected subcortical infarct at the right frontal parietal junction. 2. Otherwise stable chronic small vessel ischemic changes of the white matter and chronic infarcts within the left occipital lobe, right cerebellum and right white matter. Electronically Signed   By: 05/25/2020 M.D.   On: 06/05/2020 19:04   MR BRAIN WO CONTRAST  Result Date: 06/06/2020 CLINICAL DATA:  Seizure EXAM: MRI HEAD WITHOUT CONTRAST TECHNIQUE: Multiplanar, multiecho pulse sequences of the brain and surrounding structures were obtained without intravenous contrast. COMPARISON:  03/25/2020 FINDINGS: Brain: Multifocal abnormal diffusion restriction within both cerebral hemispheres and the right cerebellum, new since the prior study. The largest area of acute ischemia is in the anterior right parietal lobe. No acute or  chronic hemorrhage. There is multifocal hyperintense T2-weighted signal within the white matter. Parenchymal volume and CSF spaces are normal. There is an old right cerebellar infarct. The midline structures are normal. Vascular: Major flow voids are preserved. Skull and upper cervical spine: Normal calvarium and skull base. Visualized upper cervical spine and soft tissues are normal. Sinuses/Orbits:No paranasal sinus fluid levels or advanced mucosal thickening. No mastoid or middle ear effusion. Normal orbits. IMPRESSION: Multifocal acute ischemia within both cerebral hemispheres and right cerebellum.  Distribution is most consistent with a central (cardiac or aortic) embolic process. No hemorrhage or mass effect. Electronically Signed   By: Deatra RobinsonKevin  Herman M.D.   On: 06/06/2020 01:10   CT Angio Chest/Abd/Pel for Dissection W and/or Wo Contrast  Result Date: 06/06/2020 CLINICAL DATA:  Abdominal pain, aortic dissection suspected. EXAM: CT ANGIOGRAPHY CHEST, ABDOMEN AND PELVIS TECHNIQUE: Non-contrast CT of the chest was initially obtained. Multidetector CT imaging through the chest, abdomen and pelvis was performed using the standard protocol during bolus administration of intravenous contrast. Multiplanar reconstructed images and MIPs were obtained and reviewed to evaluate the vascular anatomy. CONTRAST:  75mL OMNIPAQUE IOHEXOL 350 MG/ML SOLN COMPARISON:  CT abdomen pelvis 04/14/2020 FINDINGS: CTA CHEST FINDINGS Cardiovascular: Preferential opacification of the thoracic aorta. No evidence of thoracic aortic aneurysm or dissection. Severe calcified and noncalcified atherosclerotic plaque of the thoracic aorta. At least moderate stenosis of the proximal left subclavian artery due to calcified and noncalcified atherosclerotic plaque (7:23). Normal heart size. No significant pericardial effusion. Four-vessel coronary artery calcifications. Mediastinum/Nodes: No enlarged mediastinal, hilar, or axillary lymph nodes. Thyroid gland, trachea, and esophagus demonstrate no significant findings. Lungs/Pleura: Biapical pleural/pulmonary scarring. Mild biapical paraseptal emphysematous changes. Right lower lobe subpleural micronodule (8:81). Right lower lobe calcified subpleural micronodule (8:96). Calcified micronodule along the left major fissure in the left lower lobe (8: 108). No pulmonary mass. No focal consolidation. No pleural effusion. No pneumothorax. Musculoskeletal: No chest wall abnormality. No suspicious lytic or blastic osseous lesions. No acute displaced fracture. Review of the MIP images confirms the above  findings. CTA ABDOMEN AND PELVIS FINDINGS VASCULAR Aorta: Severe calcified and noncalcified atherosclerotic plaque. Normal caliber aorta without aneurysm, dissection, vasculitis or significant stenosis. Celiac: At least moderate atherosclerotic plaque at its origin. Patent without evidence of aneurysm, dissection, vasculitis or significant stenosis. SMA: At least moderate atherosclerotic plaque at its origin. Patent without evidence of aneurysm, dissection, vasculitis or significant stenosis. Renals: 2 right renal arteries are noted within the inferior pole supplied by the diminutive of the 2 arteries. Is diminutive artery is likely filling retrograde with the origin stenosed by atherosclerotic plaque. 2 left renal arteries are noted. Mild-to-moderate atherosclerotic plaque. No aneurysm, dissection, vasculitis, fibromuscular dysplasia or significant stenosis. IMA: Patent without evidence of aneurysm, dissection, vasculitis or significant stenosis. Inflow: Severe atherosclerotic plaque with the right common iliac artery measuring at the upper limits of normal (1.5 cm). Otherwise patent without evidence of aneurysm, dissection, vasculitis or significant stenosis. Veins: No obvious venous abnormality within the limitations of this arterial phase study. Review of the MIP images confirms the above findings. NON-VASCULAR Hepatobiliary: No adrenal nodule bilaterally. Bilateral kidneys enhance symmetrically. Redemonstration of a punctate right nephrolithiasis. No hydronephrosis. No hydroureter. The urinary bladder is unremarkable. Pancreas: No focal lesion. Normal pancreatic contour. No surrounding inflammatory changes. No main pancreatic ductal dilatation. Spleen: Normal in size without focal abnormality. Adrenals/Urinary Tract: No adrenal nodule bilaterally. There is a markedly delayed nephrogram of the inferior right renal pole. Remainder of bilateral kidneys enhance symmetrically.  Bilateral subcentimeter hypodensities  are too small to characterize. No hydronephrosis. No hydroureter. The urinary bladder is distended with urine. Stomach/Bowel: Stomach is within normal limits. No evidence of bowel wall thickening or dilatation. Question few scattered colonic diverticula of the left colon. Appendix appears normal. Lymphatic: No definite lymphadenopathy. Reproductive: Prominent poorly visualized prostate gland. Other: No intraperitoneal free fluid. No intraperitoneal free gas. No organized fluid collection. Musculoskeletal: No abdominal wall hernia or abnormality. No suspicious lytic or blastic osseous lesions. No acute displaced fracture. Review of the MIP images confirms the above findings. IMPRESSION: 1. Right inferior renal pole infarction with thrombosis of the diminutive accessory right renal artery. 2. No thoracic or abdominal aorta aneurysm or dissection in a patient with severe atherosclerosis. Aortic Atherosclerosis (ICD10-I70.0) with four-vessel coronary artery calcifications. 3. Ectatic right common iliac artery (1.5 cm). 4. Other imaging findings of potential clinical significance: Nonobstructive punctate right nephrolithiasis. Emphysema (ICD10-J43.9). These results were called by telephone at the time of interpretation on 06/06/2020 at 12:52 am to provider [facility] , who verbally acknowledged these results. Electronically Signed   By: Tish Frederickson M.D.   On: 06/06/2020 00:56   PHYSICAL EXAM Constitutional: Frail middle-aged African-American male not in distress Psych: Affect appropriate to situation, anxious and poorly interactive secondary to abdominal pain Eyes: No scleral injection HENT: No oropharyngeal obstruction.  MSK: no joint deformities.  Cardiovascular: Normal rate and regular rhythm.  Respiratory: Slightly tachypneic secondary to pain but otherwise does not appear to be in any respiratory distress.  No grossly audible wheezing GI: Soft.  No distension.  Tender to palpation with some  guarding Skin: Warm dry and intact visible skin  Neuro: Mental Status: Patient is awake, alert, oriented to person, place, month, year, and situation. Patient is able to give limited history secondary acute pain No signs of aphasia or neglect though he has limited participation in examination due to pain Cranial Nerves: II: Pupils are round, and reactive to light, left pupil perhaps half millimeter larger than the right but both reactive III,IV, VI: EOMI to tracking examiner V: Facial sensation is symmetric to eyelash brush VII: Facial movement is symmetric.  VIII: hearing is intact to voice XII: tongue is midline without atrophy or fasciculations.  Motor: Tone is normal. Bulk is normal.  He purposefully moves bilateral upper extremities to hold emesis bag.  Had spontaneous movement of the bilateral lower extremities that is grossly equal.    He has mild left-sided weakness slight weakness of left grip intrinsic hand muscles are bedside of the left upper extremity.  Mild left leg weakness.[name]  He is slightly weaker on the right which appears to be baseline deficit per prior notes Sensory: Equally reactive to touch in all 4 extremities Deep Tendon Reflexes: 2+ in the biceps but noticeably brisker on the right than the left Plantars: Toes are mute bilaterally Cerebellar: Does not participate  ASSESSMENT/PLAN William Acevedo is a 65 y.o. male with a past medical history significant for emobolic multifocal CVA (right cerebellum, right PCA, bilateral MCA/ACA territories) Mar 2022, hypertension, hyperlipidemia, diabetes, NSTEMI, renal artery stenosis, ongoing polysubstance use (cocaine, tobacco, alcohol), protein calorie malnutrition, herpes zoster Mar 2022, and multiple presentations to ED for abdominal pain.  Patient had witnessed seizure-like activity in the emergency department followed by decreased LOC, now alert and fully oriented Embolic multifocal acute ischemia within both cerebral  hemispheres and right cerebellum likely due to polysubstance abuse with ETOH, crack cocaine and THC.   Code Stroke: New age indeterminate  suspected subcortical infarct at the right frontal parietal junction.  CTA head & neck:Right inferior renal pole infarction with thrombosis of the diminutive accessory right renal artery.   CWC:[id number] acute ischemia within both cerebral hemispheres and right cerebellum. Distribution is most consistent with a central (cardiac or aortic) embolic process. No hemorrhage or mass effect  2D Echo EF 60 to 65%.  No cardiac source of embolism.  EEG pending  LDL 120  HgbA1c 10.2  VTE prophylaxis - per primary team    Diet   Diet heart healthy/carb modified Room service appropriate? Yes; Fluid consistency: Thin     Therapy recommendations:  CIR  Disposition:  CIR accepted, pending auth  Hypertension  Hx of Renal artery stenosis  . Permissive hypertension (OK if < 220/120) but gradually normalize in 5-7 days . Long-term BP goal normotensive  Hyperlipidemia  Home meds:  Lipitor 20mg  resumed in hospital  LDL 120, not at goal < 70  High intensity statin:   Continue statin at discharge  Diabetes type II Uncontrolled  HgbA1c 10.2, goal < 7.0  CBGs Recent Labs    06/05/20 1824 06/05/20 1927 06/06/20 0447  GLUCAP 323* 283* 279*      Management per primary team  Polysubstance Abuse  No current withdrawal issues  Management per primary team  Hx of Stroke and NSTEMI 03/2020  Multifocal small or punctate acute and subacute infarcts involving right cerebellum, right ACA and MCA/PCA and b/l MCA/ACA regions, no hemorrhagic conversion. Definitely cardioembolic pattern. Along with his renal infarct, he was found to have possible PFO with EF 65%.  in the setting of cocaine vasculopathy. TEE showed small mitral valve vegetation versus calcification and possible PFO. He had no clinical signs of active endocarditis and negative blood cultures.     Other Stroke Risk Factors  Advanced Age >/= 64   Cigarette, crack cocaine and marijuana smoker.   ETOH abuse:chronic drink every day 6 or more beers  Substance abuse - UDS:  THC POSITIVE, Cocaine POSITIVE. Patient advised to stop using due to stroke risk.  NSTEMI 03/2020  Other Active Problems  Protein calorie malnutrition   Chronic abd pain:  CTA chest/abdomen/pelvis notable for severe atherosclerosis, right inferior renal pole infarction, and thrombosis of diminutive accessory right renal artery   Hospital day # 0 I have personally obtained history,examined this patient, reviewed notes, independently viewed imaging studies, participated in medical decision making and plan of care.ROS completed by me personally and pertinent positives fully documented  I have made any additions or clarifications directly to the above note. Agree with note above.  He presented with by cerebral embolic small infarcts in the setting of cocaine and marijuana abuse.  There is a question of recent herpes zoster but we are unable to substantiate this.  Patient counseled to quit cocaine and marijuana.  Continue ongoing stroke work-up.  We will defer decision on spinal tap till more definitive information about whether he actually had a herpes zoste and whether he was was treated adequately for it or not is obtained.  Dr. 04/2020 plan to reach family to gather this information. Greater than 50% time during the 35-minute visit were spent in counseling and coordination of care about his embolic strokes cocaine abuse and answering questions.  Discussed with Dr. Ashley Royalty, MD Medical Director [facility] Pager: [phone] 06/06/2020 4:31 PM   To contact Stroke Continuity provider, please refer to 06/08/2020. After hours, contact General Neurology

## 2020-06-07 ENCOUNTER — Inpatient Hospital Stay (HOSPITAL_COMMUNITY): Payer: No Typology Code available for payment source

## 2020-06-07 DIAGNOSIS — R569 Unspecified convulsions: Secondary | ICD-10-CM

## 2020-06-07 DIAGNOSIS — I6389 Other cerebral infarction: Secondary | ICD-10-CM

## 2020-06-07 LAB — GLUCOSE, CAPILLARY
Glucose-Capillary: 156 mg/dL — ABNORMAL HIGH (ref 70–99)
Glucose-Capillary: 365 mg/dL — ABNORMAL HIGH (ref 70–99)
Glucose-Capillary: 249 mg/dL — ABNORMAL HIGH (ref 70–99)
Glucose-Capillary: 178 mg/dL — ABNORMAL HIGH (ref 70–99)
Glucose-Capillary: 230 mg/dL — ABNORMAL HIGH (ref 70–99)

## 2020-06-07 LAB — VARICELLA ZOSTER ANTIBODY, IGG: Varicella IgG: >4000 index (ref >165)

## 2020-06-07 MED ORDER — INSULIN GLARGINE 100 UNIT/ML ~~LOC~~ SOLN
10.0000 [IU] | Freq: Every day | SUBCUTANEOUS | Status: DC
  Administered 2020-06-08 – 2020-06-09 (×2): 10 [IU] via SUBCUTANEOUS
  Filled 2020-06-07 (×2): qty 0.1

## 2020-06-07 MED ORDER — INSULIN GLARGINE 100 UNIT/ML ~~LOC~~ SOLN
14.0000 [IU] | Freq: Every day | SUBCUTANEOUS | Status: DC
  Administered 2020-06-07 – 2020-06-08 (×2): 14 [IU] via SUBCUTANEOUS
  Filled 2020-06-07 (×3): qty 0.14

## 2020-06-07 MED ORDER — AMITRIPTYLINE HCL 10 MG PO TABS
20.0000 mg | ORAL_TABLET | Freq: Every day | ORAL | Status: DC
  Administered 2020-06-07 – 2020-06-08 (×2): 20 mg via ORAL
  Filled 2020-06-07 (×3): qty 2

## 2020-06-07 MED ORDER — LIDOCAINE 5 % EX PTCH
1.0000 | MEDICATED_PATCH | CUTANEOUS | Status: DC
  Administered 2020-06-07 – 2020-06-09 (×3): 1 via TRANSDERMAL
  Filled 2020-06-07 (×3): qty 1

## 2020-06-07 MED ORDER — CLOPIDOGREL BISULFATE 75 MG PO TABS
75.0000 mg | ORAL_TABLET | Freq: Every day | ORAL | Status: DC
  Administered 2020-06-07 – 2020-06-09 (×3): 75 mg via ORAL
  Filled 2020-06-07 (×3): qty 1

## 2020-06-07 MED ORDER — HYDROCODONE-ACETAMINOPHEN 5-325 MG PO TABS
1.0000 | ORAL_TABLET | ORAL | Status: DC | PRN
  Administered 2020-06-07 – 2020-06-09 (×11): 1 via ORAL
  Filled 2020-06-07 (×12): qty 1

## 2020-06-07 NOTE — Progress Notes (Signed)
STROKE TEAM PROGRESS NOTE   INTERVAL HISTORY Patient is lying comfortably in bed.  Today he confirms that he did have shingles in early March this year and examination does show what looks like an old healed vesicular rash and right flank.  Patient is been having shingles pains and currently has a Lidoderm patch over the site.. Vitals:   06/06/20 2006 06/06/20 2317 06/07/20 0719 06/07/20 1103  BP: 133/69 137/82 134/73 (!) 130/91  Pulse: 95 91 84 97  Resp: 18 19 18 18   Temp: 98.5 F (36.9 C) 97.9 F (36.6 C) 97.9 F (36.6 C) 98.3 F (36.8 C)  TempSrc:   Oral Oral  SpO2: 100% 99% 100% 100%  Weight:      Height:       CBC:  Recent Labs  Lab 06/05/20 1822 06/05/20 1843  WBC 6.1  --   NEUTROABS 4.0  --   HGB 10.5* 10.5*  HCT 32.7* 31.0*  MCV 90.1  --   PLT 341  --    Basic Metabolic Panel:  Recent Labs  Lab 06/05/20 1822 06/05/20 1843  NA 133* 137  K 4.0 4.2  CL 96* 99  CO2 27  --   GLUCOSE 351* 331*  BUN 10 11  CREATININE 1.12 0.90  CALCIUM 9.3  --    Lipid Panel:  Recent Labs  Lab 06/06/20 0310  CHOL 237*  TRIG 57  HDL 106  CHOLHDL 2.2  VLDL 11  LDLCALC 06/08/20*   HgbA1c:  Recent Labs  Lab 06/06/20 0310  HGBA1C 10.2*   Urine Drug Screen:  Recent Labs  Lab 06/05/20 2259  LABOPIA NONE DETECTED  COCAINSCRNUR POSITIVE*  LABBENZ NONE DETECTED  AMPHETMU NONE DETECTED  THCU NONE DETECTED  LABBARB NONE DETECTED    Alcohol Level  Recent Labs  Lab 06/05/20 1823  ETH <10    IMAGING past 24 hours EEG adult  Result Date: 06/07/2020 06/09/2020, MD     06/07/2020  9:39 AM Patient Name: William Acevedo MRN: [name] Attending: [id number] Referring Physician/Provider: Dr Charlsie Quest Date: 06/07/2020 Duration: 24.16 mins Patient history:  65 year old male history of stroke type 2 diabetes and hypertension admitted with seizure-like activity. EEG to evaluate for seizure Level of alertness: Awake, asleep AEDs during EEG study: None  Technical aspects: This EEG study was done with scalp electrodes positioned according to the 10-20 International system of electrode placement. Electrical activity was acquired at a sampling rate of 500Hz  and reviewed with a high frequency filter of 70Hz  and a low frequency filter of 1Hz . EEG data were recorded continuously and digitally stored. Description: The posterior dominant rhythm consists of 8 Hz activity of moderate voltage (25-35 uV) seen predominantly in posterior head regions, symmetric and reactive to eye opening and eye closing. Sleep was characterized by vertex waves, sleep spindles (12 to 14 Hz), maximal frontocentral region. Hyperventilation and photic stimulation were not performed.   IMPRESSION: This study is within normal limits. No seizures or epileptiform discharges were seen throughout the recording. [name] 70   PHYSICAL EXAM Constitutional: Frail middle-aged African-American male not in distress Psych: Affect appropriate to situation, anxious and poorly interactive secondary to abdominal pain Eyes: No scleral injection HENT: No oropharyngeal obstruction.  MSK: no joint deformities.  Cardiovascular: Normal rate and regular rhythm.  Respiratory: Slightly tachypneic secondary to pain but otherwise does not appear to be in any respiratory distress.  No grossly audible wheezing GI: Soft.  No distension.  Tender  to palpation with some guarding Skin: Warm dry and intact visible skin  Neuro: Mental Status: Patient is awake, alert, oriented to person, place, month, year, and situation. Patient is able to give limited history secondary acute pain No signs of aphasia or neglect though he has limited participation in examination due to pain Cranial Nerves: II: Pupils are round, and reactive to light, left pupil perhaps half millimeter larger than the right but both reactive III,IV, VI: EOMI to tracking examiner V: Facial sensation is symmetric to eyelash brush VII: Facial  movement is symmetric.  VIII: hearing is intact to voice XII: tongue is midline without atrophy or fasciculations.  Motor: Tone is normal. Bulk is normal.  He purposefully moves bilateral upper extremities to hold emesis bag.  Had spontaneous movement of the bilateral lower extremities that is grossly equal.    He has mild left-sided weakness slight weakness of left grip intrinsic hand muscles are bedside of the left upper extremity.  Mild left leg weakness.[name]  He is slightly weaker on the right which appears to be baseline deficit per prior notes Sensory: Equally reactive to touch in all 4 extremities Deep Tendon Reflexes: 2+ in the biceps but noticeably brisker on the right than the left Plantars: Toes are mute bilaterally Cerebellar: Does not participate  ASSESSMENT/PLAN William Acevedo is a 65 y.o. male with a past medical history significant for emobolic multifocal CVA (right cerebellum, right PCA, bilateral MCA/ACA territories) Mar 2022, hypertension, hyperlipidemia, diabetes, NSTEMI, renal artery stenosis, ongoing polysubstance use (cocaine, tobacco, alcohol), protein calorie malnutrition, herpes zoster Mar 2022, and multiple presentations to ED for abdominal pain.  Patient had witnessed seizure-like activity in the emergency department followed by decreased LOC, now alert and fully oriented Embolic multifocal acute ischemia within both cerebral hemispheres and right cerebellum likely due to polysubstance abuse with ETOH, crack cocaine and THC.   Code Stroke: New age indeterminate suspected subcortical infarct at the right frontal parietal junction.  CTA chest/abdomen/pelvis:Right inferior renal pole infarction with thrombosis of the diminutive accessory right renal artery.   GEX:[id number] acute ischemia within both cerebral hemispheres and right cerebellum. Distribution is most consistent with a central (cardiac or aortic) embolic process. No hemorrhage or mass effect  2D Echo  EF 60 to 65%.  No cardiac source of embolism.  EEG normal.  No seizures   LDL 120  HgbA1c 10.2  VTE prophylaxis - per primary team    Diet   Diet heart healthy/carb modified Room service appropriate? Yes; Fluid consistency: Thin    Therapy recommendations:  CIR  Disposition:  CIR accepted, pending auth  Hypertension  Hx of Renal artery stenosis  . Permissive hypertension (OK if < 220/120) but gradually normalize in 5-7 days . Long-term BP goal normotensive  Hyperlipidemia  Home meds:  Lipitor 20mg  resumed in hospital  LDL 120, not at goal < 70  High intensity statin:   Continue statin at discharge  Diabetes type II Uncontrolled  HgbA1c 10.2, goal < 7.0  CBGs Recent Labs    06/07/20 0347 06/07/20 0809 06/07/20 1142  GLUCAP 156* 365* 249*      Management per primary team  Polysubstance Abuse  No current withdrawal issues  Management per primary team  Hx of Stroke and NSTEMI 03/2020  Multifocal small or punctate acute and subacute infarcts involving right cerebellum, right ACA and MCA/PCA and b/l MCA/ACA regions, no hemorrhagic conversion. Definitely cardioembolic pattern. Along with his renal infarct, he was found to have possible PFO with EF  65%.  in the setting of cocaine vasculopathy. TEE showed small mitral valve vegetation versus calcification and possible PFO. He had no clinical signs of active endocarditis and negative blood cultures.    Other Stroke Risk Factors  Advanced Age >/= 20   Cigarette, crack cocaine and marijuana smoker.   ETOH abuse:chronic drink every day 6 or more beers  Substance abuse - UDS:  THC POSITIVE, Cocaine POSITIVE. Patient advised to stop using due to stroke risk.  NSTEMI 03/2020  Other Active Problems  Protein calorie malnutrition   Chronic abd pain:  CTA chest/abdomen/pelvis notable for severe atherosclerosis, right inferior renal pole infarction, and thrombosis of diminutive accessory right renal  artery   Hospital day # 1   He presented with bicerebral embolic small infarcts in the setting of cocaine and marijuana abuse.  There is a question of recent herpes zoster in March 2022 but I doubt his current strokes are related to zoster vasculitis since its been more than 2 months and he was positive for cocaine during both his previous stroke in March as well as the current one..  Patient counseled to quit cocaine and marijuana.   .  I do not believe clean spinal tap and conforming herpes PCR and spinal fluid is going to change treatment at the present time.  Recommend aspirin and Plavix for 3 weeks followed by aspirin alone.  Treatment for postherpetic neuralgia with lidocaine patch and neuropathic pain medications and try to limit using narcotics as they may be habit-forming discussed with Dr. Blanchard Mane.  Stroke team will sign off.  Kindly call for questions greater than 50% time during the 25-minute visit were spent in counseling and coordination of care about his embolic strokes cocaine abuse and answering questions.  Discussed with Dr. Bubba Camp, MD Medical Director [facility] Pager: [phone] 06/07/2020 2:27 PM   To contact Stroke Continuity provider, please refer to [email or web address]. After hours, contact General Neurology

## 2020-06-07 NOTE — Progress Notes (Signed)
Inpatient Rehab Admissions:  Inpatient Rehab Consult received.  I met with patient at the bedside for rehabilitation assessment and to discuss goals and expectations of an inpatient rehab admission.  Pt acknowledged understanding of goals and expectations. Pt told AC he would prefer to get rehab at a SNF since it is a longer term placement (rehab). TOC made aware. AC will sign off.  Signed:  Graves Madden, MS, CCC-SLP Admissions Coordinator [phone]   

## 2020-06-07 NOTE — Progress Notes (Signed)
PROGRESS NOTE    William Acevedo  ZOX:[id number] DOB: 1955/10/22 DOA: 06/05/2020 PCP: Oneita HurtPcp, No  Brief Narrative: 65 year old male with past medical history significant for type 2 diabetes, hypertension, hyperlipidemia, CAD, renal artery stenosis, recent herpes zoster in March 2022 affecting his right chest, history of stroke in March 2022 history of polysubstance abuse tobacco alcohol and urine drug screen positive for cocaine and THC was admitted with complaints of abdominal pain and possible witnessed seizure activity while in the ED. 06/07/2020 patient was eating breakfast when I saw him this morning.  He was holding onto utensils in his right hand and had no difficulty feeding or swallowing.  He reported that he has left upper extremity weakness on and off.  He denies any abdominal pain nausea vomiting.  Overnight events noted. He is requesting to increase Dilaudid for shingles pain.  Reported that he takes hydrocodone every 2 hours at home.  It is not in his MARS on admission.  I did tell him it is dangerous and could cause death if he continues to to take hydrocodone every 2 hours and mix it with alcohol and THC and cocaine.   Assessment & Plan:   Principal Problem:   Acute ischemic multifocal multiple vascular territories stroke [facility](HCC) Active Problems:   Polysubstance abuse (HCC)   DM2 (diabetes mellitus, type 2) (HCC)   Renal infarction (HCC)   Cerebrovascular accident (CVA) (HCC)   Abdominal pain   Hypertensive urgency   Seizure-like activity (HCC)   Cocaine abuse (HCC)    #1 stroke with MRI findings of multifocal acute ischemia involving both cerebral hemispheres and right cerebellum.  Patient with history of recent stroke in March 2022. TEE at that time possible PFO. Urine drug screen positive for THC and cocaine. LDL 120 A1c is 10.2 EEG shows no epileptiform activities Repeat echo this admission pending Await PT OT  #2 uncontrolled type 2 diabetes with an A1c of  10.2. CBG (last 3) on Lantus 6 units nightly with NovoLog sliding scale. Prior to admission he was on Jardiance 25 mg daily, glipizide 10 mg daily insulin NovoLog 5 units 3 times a day and Lantus 12 units nightly. Will increase his Lantus to 8 units in the morning and 12 units at bedtime.  Recent Labs    06/07/20 0347 06/07/20 0809 06/07/20 1142  GLUCAP 156* 365* 249*     #3 hyperlipidemia LDL 120 on Lipitor 40 mg, he was on 20 mg at home.  #4 post herpetic neuralgia continues with right lateral chest pain -we will try lidocaine patch and hydrocodone DC Dilaudid.  #5 history of essential hypertension with current treatments.  #6 polysubstance abuse-counseled against cessation  #8 renal infarction by CT-patient asymptomatic from this?cocaine induced.   Estimated body mass index is 19.93 kg/m as calculated from the following:   Height as of this encounter: 5\' 6"  (1.676 m).   Weight as of this encounter: 56 kg.  DVT prophylaxis:lovenox Code Status: full Family Communication none at bedside Disposition Plan:  Status is: Inpatient   Dispo: The patient is from: Home              Anticipated d/c is to: SNF              Patient currently is not medically stable to d/c.   Difficult to place patient not sure     Consultants: Neurology  Procedures: None Antimicrobials: None  Subjective: He is resting in bed eating breakfast denies any difficulty swallowing or holding  the utensils.  Reports left upper extremity weakness on and off complains of pain in the right lateral chest from prior second shingles.  Reports he was taking hydrocodone every 2 hours at home However it is not recorded in the MARS  Objective: Vitals:   06/06/20 2006 06/06/20 2317 06/07/20 0719 06/07/20 1103  BP: 133/69 137/82 134/73 (!) 130/91  Pulse: 95 91 84 97  Resp: Temp: 98.5 F (36.9 C) 97.9 F (36.6 C) 97.9 F (36.6 C) 98.3 F (36.8 C)  TempSrc:   Oral Oral  SpO2: 100% 99% 100%  100%  Weight:      Height:        Intake/Output Summary (Last 24 hours) at 06/07/2020 1153 Last data filed at 06/06/2020 2306 Gross per 24 hour  Intake --  Output 675 ml  Net -675 ml   Filed Weights   06/05/20 2013  Weight: 56 kg    Examination:  General exam: Appears calm and comfortable  Respiratory system: Clear to auscultation. Respiratory effort normal. Cardiovascular system: S1 & S2 heard, RRR. No JVD, murmurs, rubs, gallops or clicks. No pedal edema. Gastrointestinal system: Abdomen is nondistended, soft and nontender. No organomegaly or masses felt. Normal bowel sounds heard. Central nervous system: Alert and oriented. No focal neurological deficits. Extremities: Symmetric 5 x 5 power. Skin: No rashes, lesions or ulcers Psychiatry: Judgement and insight appear normal. Mood & affect appropriate.     Data Reviewed: I have personally reviewed following labs and imaging studies  CBC: Recent Labs  Lab 06/05/20 1822 06/05/20 1843  WBC 6.1  --   NEUTROABS 4.0  --   HGB 10.5* 10.5*  HCT 32.7* 31.0*  MCV 90.1  --   PLT 341  --    Basic Metabolic Panel: Recent Labs  Lab 06/05/20 1822 06/05/20 1843  NA 133* 137  K 4.0 4.2  CL 96* 99  CO2 27  --   GLUCOSE 351* 331*  BUN 10 11  CREATININE 1.12 0.90  CALCIUM 9.3  --    GFR: Estimated Creatinine Clearance: 64 mL/min (by C-G formula based on SCr of 0.9 mg/dL). Liver Function Tests: Recent Labs  Lab 06/05/20 1822  AST 29  ALT 18  ALKPHOS 62  BILITOT 0.6  PROT 7.8  ALBUMIN 3.7   Recent Labs  Lab 06/05/20 1822  LIPASE 26   No results for input(s): AMMONIA in the last 168 hours. Coagulation Profile: No results for input(s): INR, PROTIME in the last 168 hours. Cardiac Enzymes: No results for input(s): CKTOTAL, CKMB, CKMBINDEX, TROPONINI in the last 168 hours. BNP (last 3 results) No results for input(s): PROBNP in the last 8760 hours. HbA1C: Recent Labs    06/06/20 0310  HGBA1C 10.2*    CBG: Recent Labs  Lab 06/06/20 2004 06/06/20 2315 06/07/20 0347 06/07/20 0809 06/07/20 1142  GLUCAP 76 288* 156* 365* 249*   Lipid Profile: Recent Labs    06/06/20 0310  CHOL 237*  HDL 106  LDLCALC 120*  TRIG 57  CHOLHDL 2.2   Thyroid Function Tests: No results for input(s): TSH, T4TOTAL, FREET4, T3FREE, THYROIDAB in the last 72 hours. Anemia Panel: No results for input(s): VITAMINB12, FOLATE, FERRITIN, TIBC, IRON, RETICCTPCT in the last 72 hours. Sepsis Labs: No results for input(s): PROCALCITON, LATICACIDVEN in the last 168 hours.  Recent Results (from the past 240 hour(s))  SARS CORONAVIRUS 2 (TAT 6-24 HRS) Nasopharyngeal Nasopharyngeal Swab     Status: None   Collection  Time: 06/06/20  4:03 AM   Specimen: Nasopharyngeal Swab  Result Value Ref Range Status   SARS Coronavirus 2 NEGATIVE NEGATIVE Final    Comment: (NOTE) SARS-CoV-2 target nucleic acids are NOT DETECTED.  The SARS-CoV-2 RNA is generally detectable in upper and lower respiratory specimens during the acute phase of infection. Negative results do not preclude SARS-CoV-2 infection, do not rule out co-infections with other pathogens, and should not be used as the sole basis for treatment or other patient management decisions. Negative results must be combined with clinical observations, patient history, and epidemiological information. The expected result is Negative.  Fact Sheet for Patients: HairSlick.no  Fact Sheet for Healthcare Providers: quierodirigir.com  This test is not yet approved or cleared by the [place] FDA and  has been authorized for detection and/or diagnosis of SARS-CoV-2 by FDA under an Emergency Use Authorization (EUA). This EUA will remain  in effect (meaning this test can be used) for the duration of the COVID-19 declaration under Se ction 564(b)(1) of the Act, 21 U.S.C. section 360bbb-3(b)(1), unless the  authorization is terminated or revoked sooner.  Performed at [facility], [address], [place], [place] [address]          Radiology Studies: CT Head Wo Contrast  Result Date: 06/05/2020 CLINICAL DATA:  Seizure EXAM: CT HEAD WITHOUT CONTRAST TECHNIQUE: Contiguous axial images were obtained from the base of the skull through the vertex without intravenous contrast. COMPARISON:  CT 04/14/2020, 03/25/2020, MRI 03/25/2020 FINDINGS: Brain: No hemorrhage or intracranial mass. Small chronic infarcts in the right cerebellum and right white matter. Age indeterminate subcortical infarct at the right frontal parietal junction, series 2, image 28. Mild chronic small vessel ischemic changes of the white matter. Chronic left occipital infarct. Stable ventricle size Vascular: No hyperdense vessels.  Carotid vascular calcification Skull: Normal. Negative for fracture or focal lesion. Sinuses/Orbits: No acute finding. Other: None IMPRESSION: 1. New age indeterminate suspected subcortical infarct at the right frontal parietal junction. 2. Otherwise stable chronic small vessel ischemic changes of the white matter and chronic infarcts within the left occipital lobe, right cerebellum and right white matter. Electronically Signed   By: Jasmine Pang M.D.   On: 06/05/2020 19:04   MR BRAIN WO CONTRAST  Result Date: 06/06/2020 CLINICAL DATA:  Seizure EXAM: MRI HEAD WITHOUT CONTRAST TECHNIQUE: Multiplanar, multiecho pulse sequences of the brain and surrounding structures were obtained without intravenous contrast. COMPARISON:  03/25/2020 FINDINGS: Brain: Multifocal abnormal diffusion restriction within both cerebral hemispheres and the right cerebellum, new since the prior study. The largest area of acute ischemia is in the anterior right parietal lobe. No acute or chronic hemorrhage. There is multifocal hyperintense T2-weighted signal within the white matter. Parenchymal volume and CSF spaces are normal. There  is an old right cerebellar infarct. The midline structures are normal. Vascular: Major flow voids are preserved. Skull and upper cervical spine: Normal calvarium and skull base. Visualized upper cervical spine and soft tissues are normal. Sinuses/Orbits:No paranasal sinus fluid levels or advanced mucosal thickening. No mastoid or middle ear effusion. Normal orbits. IMPRESSION: Multifocal acute ischemia within both cerebral hemispheres and right cerebellum. Distribution is most consistent with a central (cardiac or aortic) embolic process. No hemorrhage or mass effect. Electronically Signed   By: Deatra Robinson M.D.   On: 06/06/2020 01:10   EEG adult  Result Date: 06/07/2020 Charlsie Quest, MD     06/07/2020  9:39 AM Patient Name: LINKYN GOBIN MRN: [id number] Epilepsy Attending:  Charlsie Quest Referring Physician/Provider: Dr Odie Sera Date: 06/07/2020 Duration: 24.16 mins Patient history:  65 year old male history of stroke type 2 diabetes and hypertension admitted with seizure-like activity. EEG to evaluate for seizure Level of alertness: Awake, asleep AEDs during EEG study: None Technical aspects: This EEG study was done with scalp electrodes positioned according to the 10-20 International system of electrode placement. Electrical activity was acquired at a sampling rate of 500Hz  and reviewed with a high frequency filter of 70Hz  and a low frequency filter of 1Hz . EEG data were recorded continuously and digitally stored. Description: The posterior dominant rhythm consists of 8 Hz activity of moderate voltage (25-35 uV) seen predominantly in posterior head regions, symmetric and reactive to eye opening and eye closing. Sleep was characterized by vertex waves, sleep spindles (12 to 14 Hz), maximal frontocentral region. Hyperventilation and photic stimulation were not performed.   IMPRESSION: This study is within normal limits. No seizures or epileptiform discharges were seen throughout the recording.    CT Angio Chest/Abd/Pel for Dissection W and/or Wo Contrast  Result Date: 06/06/2020 CLINICAL DATA:  Abdominal pain, aortic dissection suspected. EXAM: CT ANGIOGRAPHY CHEST, ABDOMEN AND PELVIS TECHNIQUE: Non-contrast CT of the chest was initially obtained. Multidetector CT imaging through the chest, abdomen and pelvis was performed using the standard protocol during bolus administration of intravenous contrast. Multiplanar reconstructed images and MIPs were obtained and reviewed to evaluate the vascular anatomy. CONTRAST:  66mL OMNIPAQUE IOHEXOL 350 MG/ML SOLN COMPARISON:  CT abdomen pelvis 04/14/2020 FINDINGS: CTA CHEST FINDINGS Cardiovascular: Preferential opacification of the thoracic aorta. No evidence of thoracic aortic aneurysm or dissection. Severe calcified and noncalcified atherosclerotic plaque of the thoracic aorta. At least moderate stenosis of the proximal left subclavian artery due to calcified and noncalcified atherosclerotic plaque (7:23). Normal heart size. No significant pericardial effusion. Four-vessel coronary artery calcifications. Mediastinum/Nodes: No enlarged mediastinal, hilar, or axillary lymph nodes. Thyroid gland, trachea, and esophagus demonstrate no significant findings. Lungs/Pleura: Biapical pleural/pulmonary scarring. Mild biapical paraseptal emphysematous changes. Right lower lobe subpleural micronodule (8:81). Right lower lobe calcified subpleural micronodule (8:96). Calcified micronodule along the left major fissure in the left lower lobe (8: 108). No pulmonary mass. No focal consolidation. No pleural effusion. No pneumothorax. Musculoskeletal: No chest wall abnormality. No suspicious lytic or blastic osseous lesions. No acute displaced fracture. Review of the MIP images confirms the above findings. CTA ABDOMEN AND PELVIS FINDINGS VASCULAR Aorta: Severe calcified and noncalcified atherosclerotic plaque. Normal caliber aorta without aneurysm, dissection,  vasculitis or significant stenosis. Celiac: At least moderate atherosclerotic plaque at its origin. Patent without evidence of aneurysm, dissection, vasculitis or significant stenosis. SMA: At least moderate atherosclerotic plaque at its origin. Patent without evidence of aneurysm, dissection, vasculitis or significant stenosis. Renals: 2 right renal arteries are noted within the inferior pole supplied by the diminutive of the 2 arteries. Is diminutive artery is likely filling retrograde with the origin stenosed by atherosclerotic plaque. 2 left renal arteries are noted. Mild-to-moderate atherosclerotic plaque. No aneurysm, dissection, vasculitis, fibromuscular dysplasia or significant stenosis. IMA: Patent without evidence of aneurysm, dissection, vasculitis or significant stenosis. Inflow: Severe atherosclerotic plaque with the right common iliac artery measuring at the upper limits of normal (1.5 cm). Otherwise patent without evidence of aneurysm, dissection, vasculitis or significant stenosis. Veins: No obvious venous abnormality within the limitations of this arterial phase study. Review of the MIP images confirms the above findings. NON-VASCULAR Hepatobiliary: No adrenal nodule bilaterally. Bilateral kidneys enhance symmetrically. Redemonstration of a punctate  right nephrolithiasis. No hydronephrosis. No hydroureter. The urinary bladder is unremarkable. Pancreas: No focal lesion. Normal pancreatic contour. No surrounding inflammatory changes. No main pancreatic ductal dilatation. Spleen: Normal in size without focal abnormality. Adrenals/Urinary Tract: No adrenal nodule bilaterally. There is a markedly delayed nephrogram of the inferior right renal pole. Remainder of bilateral kidneys enhance symmetrically. Bilateral subcentimeter hypodensities are too small to characterize. No hydronephrosis. No hydroureter. The urinary bladder is distended with urine. Stomach/Bowel: Stomach is within normal limits. No  evidence of bowel wall thickening or dilatation. Question few scattered colonic diverticula of the left colon. Appendix appears normal. Lymphatic: No definite lymphadenopathy. Reproductive: Prominent poorly visualized prostate gland. Other: No intraperitoneal free fluid. No intraperitoneal free gas. No organized fluid collection. Musculoskeletal: No abdominal wall hernia or abnormality. No suspicious lytic or blastic osseous lesions. No acute displaced fracture. Review of the MIP images confirms the above findings. IMPRESSION: 1. Right inferior renal pole infarction with thrombosis of the diminutive accessory right renal artery. 2. No thoracic or abdominal aorta aneurysm or dissection in a patient with severe atherosclerosis. Aortic Atherosclerosis (ICD10-I70.0) with four-vessel coronary artery calcifications. 3. Ectatic right common iliac artery (1.5 cm). 4. Other imaging findings of potential clinical significance: Nonobstructive punctate right nephrolithiasis. Emphysema (ICD10-J43.9). These results were called by telephone at the time of interpretation on 06/06/2020 at 12:52 am to provider [facility] , who verbally acknowledged these results. Electronically Signed   By: Tish Frederickson M.D.   On: 06/06/2020 00:56   ECHOCARDIOGRAM LIMITED  Result Date: 06/06/2020    ECHOCARDIOGRAM LIMITED REPORT   Patient Name:   LEVELL TAVANO Date of Exam: 06/06/2020 Medical Rec #:  [id number]           Height:       66.0 in Accession #:    [id number]          Weight:       123.5 lb Date of Birth:  [birth year]           BSA:          1.629 m Patient Age:    65 years            BP:           107/71 mmHg Patient Gender: M                   HR:           88 bpm. Exam Location:  Inpatient Procedure: Limited Echo, Limited Color Doppler and Cardiac Doppler Indications:    stroke  History:        Patient has prior history of Echocardiogram examinations, most                 recent 03/30/2020. Risk Factors:Current Smoker,  Diabetes,                 Hypertension and polysubstance abuse.  Sonographer:    Delcie Roch Referring Phys: [id number] SRISHTI L BHAGAT IMPRESSIONS  1. Left ventricular ejection fraction, by estimation, is 60 to 65%. The left ventricle has normal function. The left ventricle has no regional wall motion abnormalities. There is severe left ventricular hypertrophy. Left ventricular diastolic parameters  are consistent with Grade I diastolic dysfunction (impaired relaxation).  2. Right ventricular systolic function is normal. The right ventricular size is normal. There is normal pulmonary artery systolic pressure.  3. Mild mitral valve regurgitation. No evidence of mitral stenosis.  4. The tricuspid valve is abnormal.  5. The aortic valve is tricuspid. There is moderate calcification of the aortic valve. There is moderate thickening of the aortic valve. FINDINGS  Left Ventricle: Left ventricular ejection fraction, by estimation, is 60 to 65%. The left ventricle has normal function. The left ventricle has no regional wall motion abnormalities. The left ventricular internal cavity size was small. There is severe left ventricular hypertrophy. Left ventricular diastolic parameters are consistent with Grade I diastolic dysfunction (impaired relaxation). Right Ventricle: The right ventricular size is normal. No increase in right ventricular wall thickness. Right ventricular systolic function is normal. There is normal pulmonary artery systolic pressure. The tricuspid regurgitant velocity is 2.34 m/s, and  with an assumed right atrial pressure of 3 mmHg, the estimated right ventricular systolic pressure is 24.9 mmHg. Pericardium: There is no evidence of pericardial effusion. Mitral Valve: Mild mitral valve regurgitation. No evidence of mitral valve stenosis. Tricuspid Valve: Apparent ruptured chordae, looks to be attached to the septal leaflet. The tricuspid valve is abnormal. Aortic Valve: The aortic valve is tricuspid.  There is moderate calcification of the aortic valve. There is moderate thickening of the aortic valve. There is moderate aortic valve annular calcification. Pulmonic Valve: The pulmonic valve was not well visualized. Pulmonic valve regurgitation is not visualized. No evidence of pulmonic stenosis. IVC IVC diam: 1.10 cm MITRAL VALVE                TRICUSPID VALVE MV Area (PHT): 4.21 cm     TR Peak grad:   21.9 mmHg MV Decel Time: 180 msec     TR Vmax:        234.00 cm/s MV E velocity: 69.40 cm/s MV A velocity: 111.00 cm/s MV E/A ratio:  0.63 Dina Rich MD Electronically signed by Dina Rich MD Signature Date/Time: 06/06/2020/2:12:24 PM    Final         Scheduled Meds: . amitriptyline  20 mg Oral QHS  . aspirin EC  81 mg Oral Daily  . atorvastatin  40 mg Oral QHS  . insulin aspart  0-9 Units Subcutaneous Q4H  . insulin glargine  6 Units Subcutaneous QHS  . lidocaine  1 patch Transdermal Q24H   Continuous Infusions:   LOS: 1 day    Alwyn Ren, MD  06/07/2020, 11:53 AM

## 2020-06-07 NOTE — Progress Notes (Signed)
EEG complete - results pending 

## 2020-06-07 NOTE — Progress Notes (Signed)
Security came to see the patient this evening and took report on his missing necklace and keys. Pt was admitted to the floor 5/21 with no keys or necklace in his belongings bag or on his person. Pt did not mention those items missing until later in the day.

## 2020-06-07 NOTE — Procedures (Signed)
Patient Name: William Acevedo  MRN: [id number]  Epilepsy Attending: Charlsie Quest  Referring Physician/Provider: Dr Odie Sera Date: 06/07/2020 Duration: 24.16 mins  Patient history:  65 year old male history of stroke type 2 diabetes and hypertension admitted with seizure-like activity. EEG to evaluate for seizure  Level of alertness: Awake, asleep  AEDs during EEG study: None  Technical aspects: This EEG study was done with scalp electrodes positioned according to the 10-20 International system of electrode placement. Electrical activity was acquired at a sampling rate of 500Hz  and reviewed with a high frequency filter of 70Hz  and a low frequency filter of 1Hz . EEG data were recorded continuously and digitally stored.   Description: The posterior dominant rhythm consists of 8 Hz activity of moderate voltage (25-35 uV) seen predominantly in posterior head regions, symmetric and reactive to eye opening and eye closing. Sleep was characterized by vertex waves, sleep spindles (12 to 14 Hz), maximal frontocentral region. Hyperventilation and photic stimulation were not performed.     IMPRESSION: This study is within normal limits. No seizures or epileptiform discharges were seen throughout the recording.  Rickie Gutierres 

## 2020-06-08 LAB — GLUCOSE, CAPILLARY
Glucose-Capillary: 126 mg/dL — ABNORMAL HIGH (ref 70–99)
Glucose-Capillary: 170 mg/dL — ABNORMAL HIGH (ref 70–99)
Glucose-Capillary: 202 mg/dL — ABNORMAL HIGH (ref 70–99)
Glucose-Capillary: 212 mg/dL — ABNORMAL HIGH (ref 70–99)
Glucose-Capillary: 267 mg/dL — ABNORMAL HIGH (ref 70–99)

## 2020-06-08 MED ORDER — PROMETHAZINE HCL 25 MG PO TABS: 25.0000 mg | ORAL_TABLET | Freq: Four times a day (QID) | ORAL | Status: DC | PRN

## 2020-06-08 MED ORDER — ONDANSETRON HCL 4 MG/2ML IJ SOLN
4.0000 mg | Freq: Four times a day (QID) | INTRAMUSCULAR | Status: DC
  Administered 2020-06-09 (×3): 4 mg via INTRAVENOUS
  Filled 2020-06-08 (×4): qty 2

## 2020-06-08 MED ORDER — SODIUM CHLORIDE 0.9 % IV SOLN
12.5000 mg | Freq: Four times a day (QID) | INTRAVENOUS | Status: DC | PRN
  Administered 2020-06-08 – 2020-06-09 (×2): 12.5 mg via INTRAVENOUS
  Filled 2020-06-08 (×2): qty 0.5

## 2020-06-08 MED ORDER — SODIUM CHLORIDE 0.9 % IV SOLN: 25.0000 mg | Freq: Four times a day (QID) | INTRAVENOUS | Status: DC | PRN

## 2020-06-08 NOTE — Progress Notes (Signed)
PROGRESS NOTE    William Acevedo  GEX:[id number] DOB: May 19, 1955 DOA: 06/05/2020 PCP: Oneita Hurt, No  Brief Narrative: 65 year old male with past medical history significant for type 2 diabetes, hypertension, hyperlipidemia, CAD, renal artery stenosis, recent herpes zoster in March 2022 affecting his right chest, history of stroke in March 2022 history of polysubstance abuse tobacco alcohol and urine drug screen positive for cocaine and THC was admitted with complaints of abdominal pain and possible witnessed seizure activity while in the ED. 06/07/2020 patient was eating breakfast when I saw him this morning.  He was holding onto utensils in his right hand and had no difficulty feeding or swallowing.  He reported that he has left upper extremity weakness on and off.  He denies any abdominal pain nausea vomiting.  Overnight events noted. He is requesting to increase Dilaudid for shingles pain.  Reported that he takes hydrocodone every 2 hours at home.  It is not in his MARS on admission.  I did tell him it is dangerous and could cause death if he continues to to take hydrocodone every 2 hours and mix it with alcohol and THC and cocaine.   Assessment & Plan:   Principal Problem:   Acute ischemic multifocal multiple vascular territories stroke [facility]) Active Problems:   Polysubstance abuse (HCC)   DM2 (diabetes mellitus, type 2) (HCC)   Renal infarction (HCC)   Cerebrovascular accident (CVA) (HCC)   Abdominal pain   Hypertensive urgency   Seizure-like activity (HCC)   Cocaine abuse (HCC)    #1 stroke with MRI findings of multifocal acute ischemia involving both cerebral hemispheres and right cerebellum.  Patient with history of recent stroke in March 2022. TEE at that time possible PFO. Urine drug screen positive for THC and cocaine. LDL 120 A1c is 10.2 EEG shows no epileptiform activities Repeat echo this admission pending  PT OT recommend SNF  CONTINUE ASA AND PLAVIX  #2 uncontrolled  type 2 diabetes with an A1c of 10.2. CBG (last 3) on Lantus 6 units nightly with NovoLog sliding scale. Prior to admission he was on Jardiance 25 mg daily, glipizide 10 mg daily insulin NovoLog 5 units 3 times a day and Lantus 12 units nightly. Will increase his Lantus to 8 units in the morning and 12 units at bedtime.  Recent Labs    06/08/20 0401 06/08/20 0818 06/08/20 1219  GLUCAP 126* 212* 202*     #3 hyperlipidemia LDL 120 on Lipitor 40 mg, he was on 20 mg at home.  #4 post herpetic neuralgia continues with right lateral chest pain -we will try lidocaine patch and hydrocodone DC Dilaudid.  #5 history of essential hypertension with current treatments.  #6 polysubstance abuse-counseled against cessation  #8 renal infarction by CT-patient asymptomatic from this?cocaine induced.   Estimated body mass index is 19.93 kg/m as calculated from the following:   Height as of this encounter: 5\' 6"  (1.676 m).   Weight as of this encounter: 56 kg.  DVT prophylaxis:lovenox Code Status: full Family Communication none at bedside Disposition Plan:  Status is: Inpatient   Dispo: The patient is from: Home              Anticipated d/c is to: SNF              Patient currently is not medically stable to d/c.   Difficult to place patient not sure     Consultants: Neurology  Procedures: None Antimicrobials: None  Subjective: NO C/O  Objective: Vitals:  06/07/20 2357 06/08/20 0359 06/08/20 0742 06/08/20 1221  BP: 123/73 129/69 (!) 179/81 (!) 142/75  Pulse: 93 88 83 86  Resp: 19 16 18 18   Temp: 98.4 F (36.9 C) 98.3 F (36.8 C) 97.6 F (36.4 C) 98 F (36.7 C)  TempSrc: Oral Oral Oral Oral  SpO2: 100% 98% 100% 100%  Weight:      Height:        Intake/Output Summary (Last 24 hours) at 06/08/2020 1258 Last data filed at 06/08/2020 0359 Gross per 24 hour  Intake --  Output 1240 ml  Net -1240 ml   Filed Weights   06/05/20 2013  Weight: 56 kg     Examination:  General exam: Appears calm and comfortable  Respiratory system: Clear to auscultation. Respiratory effort normal. Cardiovascular system: S1 & S2 heard, RRR. No JVD, murmurs, rubs, gallops or clicks. No pedal edema. Gastrointestinal system: Abdomen is nondistended, soft and nontender. No organomegaly or masses felt. Normal bowel sounds heard. Central nervous system: Alert and oriented. No focal neurological deficits. Extremities: Symmetric 5 x 5 power. Skin: No rashes, lesions or ulcers Psychiatry: Judgement and insight appear normal. Mood & affect appropriate.     Data Reviewed: I have personally reviewed following labs and imaging studies  CBC: Recent Labs  Lab 06/05/20 1822 06/05/20 1843  WBC 6.1  --   NEUTROABS 4.0  --   HGB 10.5* 10.5*  HCT 32.7* 31.0*  MCV 90.1  --   PLT 341  --    Basic Metabolic Panel: Recent Labs  Lab 06/05/20 1822 06/05/20 1843  NA 133* 137  K 4.0 4.2  CL 96* 99  CO2 27  --   GLUCOSE 351* 331*  BUN 10 11  CREATININE 1.12 0.90  CALCIUM 9.3  --    GFR: Estimated Creatinine Clearance: 64 mL/min (by C-G formula based on SCr of 0.9 mg/dL). Liver Function Tests: Recent Labs  Lab 06/05/20 1822  AST 29  ALT 18  ALKPHOS 62  BILITOT 0.6  PROT 7.8  ALBUMIN 3.7   Recent Labs  Lab 06/05/20 1822  LIPASE 26   No results for input(s): AMMONIA in the last 168 hours. Coagulation Profile: No results for input(s): INR, PROTIME in the last 168 hours. Cardiac Enzymes: No results for input(s): CKTOTAL, CKMB, CKMBINDEX, TROPONINI in the last 168 hours. BNP (last 3 results) No results for input(s): PROBNP in the last 8760 hours. HbA1C: Recent Labs    06/06/20 0310  HGBA1C 10.2*   CBG: Recent Labs  Lab 06/07/20 2000 06/07/20 2359 06/08/20 0401 06/08/20 0818 06/08/20 1219  GLUCAP 230* 267* 126* 212* 202*   Lipid Profile: Recent Labs    06/06/20 0310  CHOL 237*  HDL 106  LDLCALC 120*  TRIG 57  CHOLHDL 2.2    Thyroid Function Tests: No results for input(s): TSH, T4TOTAL, FREET4, T3FREE, THYROIDAB in the last 72 hours. Anemia Panel: No results for input(s): VITAMINB12, FOLATE, FERRITIN, TIBC, IRON, RETICCTPCT in the last 72 hours. Sepsis Labs: No results for input(s): PROCALCITON, LATICACIDVEN in the last 168 hours.  Recent Results (from the past 240 hour(s))  SARS CORONAVIRUS 2 (TAT 6-24 HRS) Nasopharyngeal Nasopharyngeal Swab     Status: None   Collection Time: 06/06/20  4:03 AM   Specimen: Nasopharyngeal Swab  Result Value Ref Range Status   SARS Coronavirus 2 NEGATIVE NEGATIVE Final    Comment: (NOTE) SARS-CoV-2 target nucleic acids are NOT DETECTED.  The SARS-CoV-2 RNA is generally detectable in upper and  lower respiratory specimens during the acute phase of infection. Negative results do not preclude SARS-CoV-2 infection, do not rule out co-infections with other pathogens, and should not be used as the sole basis for treatment or other patient management decisions. Negative results must be combined with clinical observations, patient history, and epidemiological information. The expected result is Negative.  Fact Sheet for Patients: HairSlick.no  Fact Sheet for Healthcare Providers: quierodirigir.com  This test is not yet approved or cleared by the [place] FDA and  has been authorized for detection and/or diagnosis of SARS-CoV-2 by FDA under an Emergency Use Authorization (EUA). This EUA will remain  in effect (meaning this test can be used) for the duration of the COVID-19 declaration under Se ction 564(b)(1) of the Act, 21 U.S.C. section 360bbb-3(b)(1), unless the authorization is terminated or revoked sooner.  Performed at [facility], [address], [place], [place] [address]          Radiology Studies: EEG adult  Result Date: 06-Jul-2020 Charlsie Quest, MD     06-Jul-2020  9:39 AM Patient  Name: CALIBER LANDESS MRN: [id number] Epilepsy Attending: Charlsie Quest Referring Physician/Provider: Dr Odie Sera Date: 07-06-20 Duration: 24.16 mins Patient history:  65 year old male history of stroke type 2 diabetes and hypertension admitted with seizure-like activity. EEG to evaluate for seizure Level of alertness: Awake, asleep AEDs during EEG study: None Technical aspects: This EEG study was done with scalp electrodes positioned according to the 10-20 International system of electrode placement. Electrical activity was acquired at a sampling rate of 500Hz  and reviewed with a high frequency filter of 70Hz  and a low frequency filter of 1Hz . EEG data were recorded continuously and digitally stored. Description: The posterior dominant rhythm consists of 8 Hz activity of moderate voltage (25-35 uV) seen predominantly in posterior head regions, symmetric and reactive to eye opening and eye closing. Sleep was characterized by vertex waves, sleep spindles (12 to 14 Hz), maximal frontocentral region. Hyperventilation and photic stimulation were not performed.   IMPRESSION: This study is within normal limits. No seizures or epileptiform discharges were seen throughout the recording. [name]        Scheduled Meds: . amitriptyline  20 mg Oral QHS  . aspirin EC  81 mg Oral Daily  . atorvastatin  40 mg Oral QHS  . clopidogrel  75 mg Oral Daily  . insulin aspart  0-9 Units Subcutaneous Q4H  . insulin glargine  10 Units Subcutaneous Daily  . insulin glargine  14 Units Subcutaneous QHS  . lidocaine  1 patch Transdermal Q24H   Continuous Infusions:   LOS: 2 days    , MD  06/08/2020, 12:58 PM

## 2020-06-08 NOTE — Progress Notes (Signed)
PT Cancellation Note  Patient Details Name: William Acevedo MRN: [id number] DOB: 06/04/1955   Cancelled Treatment:    Reason Eval/Treat Not Completed: Patient declined, no reason specified Patient refused due to nausea. RN in to provide medication. PT will re-attempt as time allows.   Caelen Reierson A. Dan Humphreys PT, DPT Acute Rehabilitation Services Pager [phone] Office [phone]    Viviann Spare 06/08/2020, 2:38 PM

## 2020-06-08 NOTE — Progress Notes (Signed)
Name: William Acevedo DOB: [birth year]  Please be advised that the above-named patient will require a short-term nursing home stay -- anticipated 30 days or less for rehabilitation and strengthening. The plan is for return home.

## 2020-06-08 NOTE — NC FL2 (Signed)
[place] MEDICAID FL2 LEVEL OF CARE SCREENING TOOL     IDENTIFICATION  Patient Name: William Acevedo Birthdate: 1955-01-26 Sex: male Admission Date (Current Location): 06/05/2020  [facility] and [place] Number:  Producer, television/film/video and Address:  [facility] [place], [address] [place], [place] [address]      Provider Number: [id number]  Attending Physician Name and Address:  Alwyn Ren, MD  Relative Name and Phone Number:       Current Level of Care: Hospital Recommended Level of Care: Skilled Nursing Facility Prior Approval Number:    Date Approved/Denied:   PASRR Number: Manual review  Discharge Plan: SNF    Current Diagnoses: Patient Active Problem List   Diagnosis Date Noted  . Acute ischemic multifocal multiple vascular territories stroke (HCC) 06/06/2020  . Seizure-like activity (HCC) 06/06/2020  . Cocaine abuse (HCC)   . ARF (acute renal failure) (HCC) 04/14/2020  . Acute blood loss anemia 04/14/2020  . GI bleed 04/14/2020  . Nausea & vomiting 04/14/2020  . Acute metabolic encephalopathy 04/14/2020  . Hypertensive urgency 04/14/2020  . Abdominal pain 04/02/2020  . Protein-calorie malnutrition, severe 03/27/2020  . Cerebral thrombosis with cerebral infarction 03/26/2020  . NSTEMI (non-ST elevated myocardial infarction) (HCC) 03/25/2020  . Secondary diabetes mellitus with HHNC (hyperglycemia hyperosmolar non-ketotic coma) (HCC) 03/25/2020  . Elevated troponin   . Renal infarction (HCC)   . Cerebrovascular accident (CVA) (HCC)   . Cocaine use disorder, severe, dependence (HCC) 04/08/2019  . Major depressive disorder 04/08/2019  . Epiglottitis 04/07/2019  . Cocaine use 04/07/2019  . Acute encephalopathy 04/07/2019  . Overdose 07/06/2015  . AKI (acute kidney injury) (HCC) 07/06/2015  . Polysubstance abuse (HCC) 07/06/2015  . DM2 (diabetes mellitus, type 2) (HCC) 07/06/2015  . Dehydration   . MDD (major depressive  disorder), recurrent episode, mild (HCC)   . Uncomplicated alcohol dependence (HCC)     Orientation RESPIRATION BLADDER Height & Weight     Self,Time,Situation,Place  Normal Continent Weight: 123 lb 7.3 oz (56 kg) Height:  5\' 6"  (167.6 cm)  BEHAVIORAL SYMPTOMS/MOOD NEUROLOGICAL BOWEL NUTRITION STATUS      Continent Diet (heart healthy, carb modified)  AMBULATORY STATUS COMMUNICATION OF NEEDS Skin   Limited Assist Verbally Normal                       Personal Care Assistance Level of Assistance  Bathing,Feeding,Dressing Bathing Assistance: Limited assistance Feeding assistance: Limited assistance Dressing Assistance: Limited assistance     Functional Limitations Info             SPECIAL CARE FACTORS FREQUENCY  PT (By licensed PT),OT (By licensed OT)     PT Frequency: 5x/wk OT Frequency: 5x/wk            Contractures Contractures Info: Not present    Additional Factors Info  Code Status,Allergies,Insulin Sliding Scale Code Status Info: Full Allergies Info: Trazodone, Lisinopril, Omeprazole   Insulin Sliding Scale Info: see DC summary       Current Medications (06/08/2020):  This is the current hospital active medication list Current Facility-Administered Medications  Medication Dose Route Frequency Provider Last Rate Last Admin  . acetaminophen (TYLENOL) tablet 650 mg  650 mg Oral Q4H PRN Opyd, 06/10/2020, MD   650 mg at 06/06/20 1042   Or  . acetaminophen (TYLENOL) 160 MG/5ML solution 650 mg  650 mg Per Tube Q4H PRN Opyd, 06/08/20, MD  Or  . acetaminophen (TYLENOL) suppository 650 mg  650 mg Rectal Q4H PRN Opyd, Lavone Neri, MD      . amitriptyline (ELAVIL) tablet 20 mg  20 mg Oral QHS Alwyn Ren, MD   20 mg at 06/07/20 2149  . aspirin EC tablet 81 mg  81 mg Oral Daily Opyd, Lavone Neri, MD   81 mg at 06/08/20 5300  . atorvastatin (LIPITOR) tablet 40 mg  40 mg Oral QHS Bhagat, Srishti L, MD   40 mg at 06/07/20 2149  . clopidogrel (PLAVIX)  tablet 75 mg  75 mg Oral Daily Micki Riley, MD   75 mg at 06/08/20 5110  . HYDROcodone-acetaminophen (NORCO/VICODIN) 5-325 MG per tablet 1 tablet  1 tablet Oral Q4H PRN Alwyn Ren, MD   1 tablet at 06/08/20 0300  . insulin aspart (novoLOG) injection 0-9 Units  0-9 Units Subcutaneous Q4H Opyd, Lavone Neri, MD   3 Units at 06/08/20 [phone]  . insulin glargine (LANTUS) injection 10 Units  10 Units Subcutaneous Daily Alwyn Ren, MD   10 Units at 06/08/20 [phone]  . insulin glargine (LANTUS) injection 14 Units  14 Units Subcutaneous QHS Alwyn Ren, MD   14 Units at 06/07/20 2153  . labetalol (NORMODYNE) injection 10 mg  10 mg Intravenous Q4H PRN Opyd, Lavone Neri, MD      . lidocaine (LIDODERM) 5 % 1 patch  1 patch Transdermal Q24H Alwyn Ren, MD   1 patch at 06/08/20 [phone]  . ondansetron (ZOFRAN) injection 4 mg  4 mg Intravenous Q6H PRN Chotiner, Claudean Severance, MD   4 mg at 06/07/20 0946  . senna-docusate (Senokot-S) tablet 1 tablet  1 tablet Oral QHS PRN Opyd, Lavone Neri, MD   1 tablet at 06/07/20 1700     Discharge Medications: Please see discharge summary for a list of discharge medications.  Relevant Imaging Results:  Relevant Lab Results:   Additional Information SS#: [id number]  Baldemar Lenis, LCSW

## 2020-06-08 NOTE — Plan of Care (Signed)
  Problem: Education: Goal: Knowledge of General Education information will improve Description: Including pain rating scale, medication(s)/side effects and non-pharmacologic comfort measures Outcome: Progressing   Problem: Health Behavior/Discharge Planning: Goal: Ability to manage health-related needs will improve Outcome: Progressing   Problem: Clinical Measurements: Goal: Ability to maintain clinical measurements within normal limits will improve Outcome: Progressing Goal: Will remain free from infection Outcome: Progressing Goal: Diagnostic test results will improve Outcome: Progressing Goal: Respiratory complications will improve Outcome: Progressing Goal: Cardiovascular complication will be avoided Outcome: Progressing   Problem: Activity: Goal: Risk for activity intolerance will decrease Outcome: Progressing   Problem: Nutrition: Goal: Adequate nutrition will be maintained Outcome: Progressing   Problem: Coping: Goal: Level of anxiety will decrease Outcome: Progressing   Problem: Elimination: Goal: Will not experience complications related to bowel motility Outcome: Progressing Goal: Will not experience complications related to urinary retention Outcome: Progressing   Problem: Pain Managment: Goal: General experience of comfort will improve Outcome: Progressing   Problem: Safety: Goal: Ability to remain free from injury will improve Outcome: Progressing   Problem: Skin Integrity: Goal: Risk for impaired skin integrity will decrease Outcome: Progressing   Problem: Education: Goal: Expressions of having a comfortable level of knowledge regarding the disease process will increase Outcome: Progressing   Problem: Coping: Goal: Ability to adjust to condition or change in health will improve Outcome: Progressing Goal: Ability to identify appropriate support needs will improve Outcome: Progressing   Problem: Health Behavior/Discharge Planning: Goal:  Compliance with prescribed medication regimen will improve Outcome: Progressing   Problem: Medication: Goal: Risk for medication side effects will decrease Outcome: Progressing   Problem: Clinical Measurements: Goal: Complications related to the disease process, condition or treatment will be avoided or minimized Outcome: Progressing Goal: Diagnostic test results will improve Outcome: Progressing   Problem: Safety: Goal: Verbalization of understanding the information provided will improve Outcome: Progressing   Problem: Self-Concept: Goal: Level of anxiety will decrease Outcome: Progressing Goal: Ability to verbalize feelings about condition will improve Outcome: Progressing   Problem: Education: Goal: Knowledge of disease or condition will improve Outcome: Progressing Goal: Knowledge of secondary prevention will improve Outcome: Progressing Goal: Knowledge of patient specific risk factors addressed and post discharge goals established will improve Outcome: Progressing Goal: Individualized Educational Video(s) Outcome: Progressing   Problem: Coping: Goal: Will verbalize positive feelings about self Outcome: Progressing Goal: Will identify appropriate support needs Outcome: Progressing   Problem: Health Behavior/Discharge Planning: Goal: Ability to manage health-related needs will improve Outcome: Progressing   Problem: Self-Care: Goal: Ability to participate in self-care as condition permits will improve Outcome: Progressing Goal: Verbalization of feelings and concerns over difficulty with self-care will improve Outcome: Progressing Goal: Ability to communicate needs accurately will improve Outcome: Progressing   Problem: Nutrition: Goal: Risk of aspiration will decrease Outcome: Progressing Goal: Dietary intake will improve Outcome: Progressing   Problem: Intracerebral Hemorrhage Tissue Perfusion: Goal: Complications of Intracerebral Hemorrhage will be  minimized Outcome: Progressing   Problem: Ischemic Stroke/TIA Tissue Perfusion: Goal: Complications of ischemic stroke/TIA will be minimized Outcome: Progressing   Problem: Spontaneous Subarachnoid Hemorrhage Tissue Perfusion: Goal: Complications of Spontaneous Subarachnoid Hemorrhage will be minimized Outcome: Progressing   

## 2020-06-09 LAB — COMPREHENSIVE METABOLIC PANEL
ALT: 16 U/L (ref 0–44)
AST: 19 U/L (ref 15–41)
Albumin: 3.6 g/dL (ref 3.5–5.0)
Alkaline Phosphatase: 50 U/L (ref 38–126)
Anion gap: 10 (ref 5–15)
BUN: 9 mg/dL (ref 8–23)
CO2: 27 mmol/L (ref 22–32)
Calcium: 9 mg/dL (ref 8.9–10.3)
Chloride: 99 mmol/L (ref 98–111)
Creatinine, Ser: 0.73 mg/dL (ref 0.61–1.24)
GFR, Estimated: >60 mL/min (ref >60)
Glucose, Bld: 94 mg/dL (ref 70–99)
Potassium: 3.5 mmol/L (ref 3.5–5.1)
Sodium: 136 mmol/L (ref 135–145)
Total Bilirubin: 0.7 mg/dL (ref 0.3–1.2)
Total Protein: 7.3 g/dL (ref 6.5–8.1)

## 2020-06-09 LAB — CBC
HCT: 36.1 % — ABNORMAL LOW (ref 39.0–52.0)
Hemoglobin: 12.2 g/dL — ABNORMAL LOW (ref 13.0–17.0)
MCH: 28.8 pg (ref 26.0–34.0)
MCHC: 33.8 g/dL (ref 30.0–36.0)
MCV: 85.1 fL (ref 80.0–100.0)
Platelets: 330 10*3/uL (ref 150–400)
RBC: 4.24 MIL/uL (ref 4.22–5.81)
RDW: 13.2 % (ref 11.5–15.5)
WBC: 6.4 10*3/uL (ref 4.0–10.5)
nRBC: 0 % (ref 0.0–0.2)

## 2020-06-09 LAB — GLUCOSE, CAPILLARY
Glucose-Capillary: 104 mg/dL — ABNORMAL HIGH (ref 70–99)
Glucose-Capillary: 130 mg/dL — ABNORMAL HIGH (ref 70–99)
Glucose-Capillary: 178 mg/dL — ABNORMAL HIGH (ref 70–99)
Glucose-Capillary: 204 mg/dL — ABNORMAL HIGH (ref 70–99)
Glucose-Capillary: 78 mg/dL (ref 70–99)

## 2020-06-09 MED ORDER — POTASSIUM CHLORIDE CRYS ER 20 MEQ PO TBCR
40.0000 meq | EXTENDED_RELEASE_TABLET | Freq: Once | ORAL | Status: AC
  Administered 2020-06-09: 40 meq via ORAL
  Filled 2020-06-09: qty 2

## 2020-06-09 MED ORDER — ATORVASTATIN CALCIUM 40 MG PO TABS: 40.0000 mg | ORAL_TABLET | Freq: Every day | ORAL | 0 refills | Status: DC

## 2020-06-09 MED ORDER — ASPIRIN 81 MG PO TBEC: 81.0000 mg | DELAYED_RELEASE_TABLET | Freq: Every day | ORAL | 11 refills | Status: DC

## 2020-06-09 MED ORDER — INSULIN GLARGINE 100 UNIT/ML SOLOSTAR PEN: 12.0000 [IU] | PEN_INJECTOR | Freq: Two times a day (BID) | SUBCUTANEOUS | 0 refills | Status: DC

## 2020-06-09 MED ORDER — HYDROCODONE-ACETAMINOPHEN 10-325 MG PO TABS: 1.0000 | ORAL_TABLET | Freq: Four times a day (QID) | ORAL | 0 refills | Status: AC | PRN

## 2020-06-09 MED ORDER — CLOPIDOGREL BISULFATE 75 MG PO TABS: 75.0000 mg | ORAL_TABLET | Freq: Every day | ORAL | 0 refills | Status: DC

## 2020-06-09 MED ORDER — ONDANSETRON 4 MG PO TBDP: 4.0000 mg | ORAL_TABLET | Freq: Three times a day (TID) | ORAL | 0 refills | Status: DC | PRN

## 2020-06-09 NOTE — Plan of Care (Signed)
Problem: Education: Goal: Knowledge of General Education information will improve Description: Including pain rating scale, medication(s)/side effects and non-pharmacologic comfort measures Outcome: Adequate for Discharge   Problem: Health Behavior/Discharge Planning: Goal: Ability to manage health-related needs will improve Outcome: Adequate for Discharge   Problem: Clinical Measurements: Goal: Ability to maintain clinical measurements within normal limits will improve Outcome: Adequate for Discharge Goal: Will remain free from infection Outcome: Adequate for Discharge Goal: Diagnostic test results will improve Outcome: Adequate for Discharge Goal: Respiratory complications will improve Outcome: Adequate for Discharge Goal: Cardiovascular complication will be avoided Outcome: Adequate for Discharge   Problem: Activity: Goal: Risk for activity intolerance will decrease Outcome: Adequate for Discharge   Problem: Nutrition: Goal: Adequate nutrition will be maintained Outcome: Adequate for Discharge   Problem: Coping: Goal: Level of anxiety will decrease Outcome: Adequate for Discharge   Problem: Elimination: Goal: Will not experience complications related to bowel motility Outcome: Adequate for Discharge Goal: Will not experience complications related to urinary retention Outcome: Adequate for Discharge   Problem: Pain Managment: Goal: General experience of comfort will improve Outcome: Adequate for Discharge   Problem: Safety: Goal: Ability to remain free from injury will improve Outcome: Adequate for Discharge   Problem: Skin Integrity: Goal: Risk for impaired skin integrity will decrease Outcome: Adequate for Discharge   Problem: Education: Goal: Expressions of having a comfortable level of knowledge regarding the disease process will increase Outcome: Adequate for Discharge   Problem: Coping: Goal: Ability to adjust to condition or change in health will  improve Outcome: Adequate for Discharge Goal: Ability to identify appropriate support needs will improve Outcome: Adequate for Discharge   Problem: Health Behavior/Discharge Planning: Goal: Compliance with prescribed medication regimen will improve Outcome: Adequate for Discharge   Problem: Medication: Goal: Risk for medication side effects will decrease Outcome: Adequate for Discharge   Problem: Clinical Measurements: Goal: Complications related to the disease process, condition or treatment will be avoided or minimized Outcome: Adequate for Discharge Goal: Diagnostic test results will improve Outcome: Adequate for Discharge   Problem: Safety: Goal: Verbalization of understanding the information provided will improve Outcome: Adequate for Discharge   Problem: Self-Concept: Goal: Level of anxiety will decrease Outcome: Adequate for Discharge Goal: Ability to verbalize feelings about condition will improve Outcome: Adequate for Discharge   Problem: Education: Goal: Knowledge of disease or condition will improve Outcome: Adequate for Discharge Goal: Knowledge of secondary prevention will improve Outcome: Adequate for Discharge Goal: Knowledge of patient specific risk factors addressed and post discharge goals established will improve Outcome: Adequate for Discharge Goal: Individualized Educational Video(s) Outcome: Adequate for Discharge   Problem: Coping: Goal: Will verbalize positive feelings about self Outcome: Adequate for Discharge Goal: Will identify appropriate support needs Outcome: Adequate for Discharge   Problem: Health Behavior/Discharge Planning: Goal: Ability to manage health-related needs will improve Outcome: Adequate for Discharge   Problem: Self-Care: Goal: Ability to participate in self-care as condition permits will improve Outcome: Adequate for Discharge Goal: Verbalization of feelings and concerns over difficulty with self-care will  improve Outcome: Adequate for Discharge Goal: Ability to communicate needs accurately will improve Outcome: Adequate for Discharge   Problem: Nutrition: Goal: Risk of aspiration will decrease Outcome: Adequate for Discharge Goal: Dietary intake will improve Outcome: Adequate for Discharge   Problem: Intracerebral Hemorrhage Tissue Perfusion: Goal: Complications of Intracerebral Hemorrhage will be minimized Outcome: Adequate for Discharge   Problem: Ischemic Stroke/TIA Tissue Perfusion: Goal: Complications of ischemic stroke/TIA will be minimized Outcome: Adequate for Discharge  Problem: Spontaneous Subarachnoid Hemorrhage Tissue Perfusion: Goal: Complications of Spontaneous Subarachnoid Hemorrhage will be minimized Outcome: Adequate for Discharge

## 2020-06-09 NOTE — Discharge Summary (Signed)
Physician Discharge Summary  BRIGHTEN ORNDOFF DDU:[id number] DOB: 09-11-1955 DOA: 06/05/2020  PCP: Pcp, No  Admit date: 06/05/2020 Discharge date: 06/09/2020  Admitted From: Home Disposition: Home  Recommendations for Outpatient Follow-up:  1. Follow up with PCP in 1-2 weeks 2. Please obtain BMP/CBC in one week 3. Please follow up with neurology as scheduled  Home Health: None Equipment/Devices: None indicated  Discharge Condition: Stable CODE STATUS: Full Diet recommendation: Diabetic low-fat low-salt diet  Brief/Interim Summary: 65 year old male with past medical history significant for type 2 diabetes, hypertension, hyperlipidemia, CAD, renal artery stenosis, recent herpes zoster in March 2022 affecting his right chest, history of stroke in March 2022 history of polysubstance abuse tobacco alcohol and urine drug screen positive for cocaine and THC was admitted with complaints of abdominal pain and possible witnessed seizure activity while in the ED.  Patient admitted as above with findings consistent with acute stroke, diffuse involving both hemispheres and cerebellum with previous stroke just March of this year.  Neurology following, at this time no further work-up continue aspirin and Plavix.  Patient was educated at length about need for medication and lifestyle compliance given his recurrent episodes, his A1c is markedly elevated at 10.2 indicating profoundly uncontrolled diabetes.  We discussed need for close monitoring and dietary compliance.  Patient was resumed at discharge on Lantus with Premeal insulin, he will need close follow-up for reinitiation and titration of p.o. diabetic medications.  Patient also has known illicit substance abuse with UDS positive for cocaine and THC, renal infarct on CT concern to be in the setting of vasospasm or more likely thrombotic event given recurrent CVA as above.  At this time patient otherwise stable and agreeable for discharge home, no PT  follow-up given improvement in symptoms over the past 48 hours  Discharge Diagnoses:  Principal Problem:   Acute ischemic multifocal multiple vascular territories stroke [facility]) Active Problems:   Polysubstance abuse ([place])   DM2 (diabetes mellitus, type 2) ([place])   Renal infarction ([place])   Cerebrovascular accident (CVA) ([place])   Abdominal pain   Hypertensive urgency   Seizure-like activity ([place])   Cocaine abuse [facility])    Discharge Instructions  Discharge Instructions    Call MD for:  extreme fatigue   Complete by: As directed    Call MD for:  persistant dizziness or light-headedness   Complete by: As directed    Diet - low sodium heart healthy   Complete by: As directed    Increase activity slowly   Complete by: As directed      Allergies as of 06/09/2020      Reactions   Trazodone    Other reaction(s): Priapism Pt reports not allergic 06/07/20   Lisinopril Swelling   Angioedema Pt reports not allergic 06/07/20   Omeprazole    Other reaction(s): ANGIOEDEMA OF LIPS, Angioedema of tongue. Pt reports not allergic 06/07/20      Medication List    STOP taking these medications   acetaminophen 325 MG tablet Commonly known as: TYLENOL   amLODipine 10 MG tablet Commonly known as: NORVASC   calamine lotion   dicyclomine 20 MG tablet Commonly known as: BENTYL   docusate sodium 100 MG capsule Commonly known as: COLACE   famotidine 20 MG tablet Commonly known as: Pepcid   gabapentin 600 MG tablet Commonly known as: NEURONTIN   HYDROmorphone 2 MG tablet Commonly known as: Dilaudid   melatonin 3 MG Tabs tablet   metoprolol tartrate 50 MG tablet Commonly known as:  LOPRESSOR   nicotine polacrilex 2 MG gum Commonly known as: NICORETTE   NovoLIN 70/30 ReliOn (70-30) 100 UNIT/ML injection Generic drug: insulin NPH-regular Human   omeprazole 10 MG capsule Commonly known as: PRILOSEC   oxyCODONE 5 MG immediate release tablet Commonly known as: Oxy IR/ROXICODONE    pantoprazole 40 MG tablet Commonly known as: PROTONIX   polyethylene glycol 17 g packet Commonly known as: MIRALAX / GLYCOLAX   sucralfate 1 g tablet Commonly known as: Carafate     TAKE these medications   amitriptyline 10 MG tablet Commonly known as: ELAVIL Take 20 mg by mouth at bedtime.   aspirin 81 MG EC tablet Take 1 tablet (81 mg total) by mouth daily. Swallow whole.   atorvastatin 40 MG tablet Commonly known as: LIPITOR Take 1 tablet (40 mg total) by mouth at bedtime. What changed:   medication strength  how much to take  when to take this   blood glucose meter kit and supplies Kit Dispense based on patient and insurance preference. Use up to four times daily as directed. (FOR ICD-9 250.00, 250.01).   clopidogrel 75 MG tablet Commonly known as: PLAVIX Take 1 tablet (75 mg total) by mouth daily.   cyclobenzaprine 5 MG tablet Commonly known as: FLEXERIL Take 1 tablet (5 mg total) by mouth 3 (three) times daily as needed for muscle spasms. What changed: when to take this   glipiZIDE 10 MG tablet Commonly known as: GLUCOTROL Take 1 tablet (10 mg total) by mouth daily before breakfast. What changed: when to take this   hydrALAZINE 50 MG tablet Commonly known as: APRESOLINE Take 1 tablet (50 mg total) by mouth every 8 (eight) hours.   HYDROcodone-acetaminophen 10-325 MG tablet Commonly known as: NORCO Take 1 tablet by mouth every 6 (six) hours as needed for up to 3 days for severe pain. What changed: reasons to take this   insulin glargine 100 UNIT/ML Solostar Pen Commonly known as: LANTUS Inject 12 Units into the skin 2 (two) times daily. What changed: when to take this   Insulin Pen Needle 29G X 12MM Misc Per instructions   multivitamin with minerals Tabs tablet Take 1 tablet by mouth daily.   NovoLOG FlexPen 100 UNIT/ML FlexPen Generic drug: insulin aspart Inject 5 Units into the skin 3 (three) times daily with meals.   ondansetron 4 MG  disintegrating tablet Commonly known as: Zofran ODT Take 1 tablet (4 mg total) by mouth every 8 (eight) hours as needed for nausea or vomiting. What changed:   when to take this  reasons to take this   pregabalin 50 MG capsule Commonly known as: LYRICA Take 50 mg by mouth 2 (two) times daily.   tadalafil 10 MG tablet Commonly known as: CIALIS Take 20 mg by mouth daily as needed for erectile dysfunction.       Allergies  Allergen Reactions  . Trazodone     Other reaction(s): Priapism Pt reports not allergic 06/07/20  . Lisinopril Swelling    Angioedema Pt reports not allergic 06/07/20  . Omeprazole     Other reaction(s): ANGIOEDEMA OF LIPS, Angioedema of tongue. Pt reports not allergic 06/07/20    Consultations: Neurology  Procedures/Studies: CT Head Wo Contrast  Result Date: 06/05/2020 CLINICAL DATA:  Seizure EXAM: CT HEAD WITHOUT CONTRAST TECHNIQUE: Contiguous axial images were obtained from the base of the skull through the vertex without intravenous contrast. COMPARISON:  CT 04/14/2020, 03/25/2020, MRI 03/25/2020 FINDINGS: Brain: No hemorrhage or intracranial mass. Small chronic  infarcts in the right cerebellum and right white matter. Age indeterminate subcortical infarct at the right frontal parietal junction, series 2, image 28. Mild chronic small vessel ischemic changes of the white matter. Chronic left occipital infarct. Stable ventricle size Vascular: No hyperdense vessels.  Carotid vascular calcification Skull: Normal. Negative for fracture or focal lesion. Sinuses/Orbits: No acute finding. Other: None IMPRESSION: 1. New age indeterminate suspected subcortical infarct at the right frontal parietal junction. 2. Otherwise stable chronic small vessel ischemic changes of the white matter and chronic infarcts within the left occipital lobe, right cerebellum and right white matter. Electronically Signed   By: Donavan Foil M.D.   On: 06/05/2020 19:04   MR BRAIN WO  CONTRAST  Result Date: 06/06/2020 CLINICAL DATA:  Seizure EXAM: MRI HEAD WITHOUT CONTRAST TECHNIQUE: Multiplanar, multiecho pulse sequences of the brain and surrounding structures were obtained without intravenous contrast. COMPARISON:  03/25/2020 FINDINGS: Brain: Multifocal abnormal diffusion restriction within both cerebral hemispheres and the right cerebellum, new since the prior study. The largest area of acute ischemia is in the anterior right parietal lobe. No acute or chronic hemorrhage. There is multifocal hyperintense T2-weighted signal within the white matter. Parenchymal volume and CSF spaces are normal. There is an old right cerebellar infarct. The midline structures are normal. Vascular: Major flow voids are preserved. Skull and upper cervical spine: Normal calvarium and skull base. Visualized upper cervical spine and soft tissues are normal. Sinuses/Orbits:No paranasal sinus fluid levels or advanced mucosal thickening. No mastoid or middle ear effusion. Normal orbits. IMPRESSION: Multifocal acute ischemia within both cerebral hemispheres and right cerebellum. Distribution is most consistent with a central (cardiac or aortic) embolic process. No hemorrhage or mass effect. Electronically Signed   By: Ulyses Jarred M.D.   On: 06/06/2020 01:10   EEG adult  Result Date: 06/07/2020 Lora Havens, MD     06/07/2020  9:39 AM Patient Name: MURLE OTTING MRN: [id number] Epilepsy Attending: Lora Havens Referring Physician/Provider: Dr Mitzi Hansen Date: 06/07/2020 Duration: 24.16 mins Patient history:  65 year old male history of stroke type 2 diabetes and hypertension admitted with seizure-like activity. EEG to evaluate for seizure Level of alertness: Awake, asleep AEDs during EEG study: None Technical aspects: This EEG study was done with scalp electrodes positioned according to the 10-20 International system of electrode placement. Electrical activity was acquired at a sampling rate of 500Hz   and reviewed with a high frequency filter of 70Hz  and a low frequency filter of 1Hz . EEG data were recorded continuously and digitally stored. Description: The posterior dominant rhythm consists of 8 Hz activity of moderate voltage (25-35 uV) seen predominantly in posterior head regions, symmetric and reactive to eye opening and eye closing. Sleep was characterized by vertex waves, sleep spindles (12 to 14 Hz), maximal frontocentral region. Hyperventilation and photic stimulation were not performed.   IMPRESSION: This study is within normal limits. No seizures or epileptiform discharges were seen throughout the recording. Lora Havens   CT Angio Chest/Abd/Pel for Dissection W and/or Wo Contrast  Result Date: 06/06/2020 CLINICAL DATA:  Abdominal pain, aortic dissection suspected. EXAM: CT ANGIOGRAPHY CHEST, ABDOMEN AND PELVIS TECHNIQUE: Non-contrast CT of the chest was initially obtained. Multidetector CT imaging through the chest, abdomen and pelvis was performed using the standard protocol during bolus administration of intravenous contrast. Multiplanar reconstructed images and MIPs were obtained and reviewed to evaluate the vascular anatomy. CONTRAST:  97m OMNIPAQUE IOHEXOL 350 MG/ML SOLN COMPARISON:  CT abdomen pelvis 04/14/2020 FINDINGS: CTA CHEST FINDINGS  Cardiovascular: Preferential opacification of the thoracic aorta. No evidence of thoracic aortic aneurysm or dissection. Severe calcified and noncalcified atherosclerotic plaque of the thoracic aorta. At least moderate stenosis of the proximal left subclavian artery due to calcified and noncalcified atherosclerotic plaque (7:23). Normal heart size. No significant pericardial effusion. Four-vessel coronary artery calcifications. Mediastinum/Nodes: No enlarged mediastinal, hilar, or axillary lymph nodes. Thyroid gland, trachea, and esophagus demonstrate no significant findings. Lungs/Pleura: Biapical pleural/pulmonary scarring. Mild biapical paraseptal  emphysematous changes. Right lower lobe subpleural micronodule (8:81). Right lower lobe calcified subpleural micronodule (8:96). Calcified micronodule along the left major fissure in the left lower lobe (8: 108). No pulmonary mass. No focal consolidation. No pleural effusion. No pneumothorax. Musculoskeletal: No chest wall abnormality. No suspicious lytic or blastic osseous lesions. No acute displaced fracture. Review of the MIP images confirms the above findings. CTA ABDOMEN AND PELVIS FINDINGS VASCULAR Aorta: Severe calcified and noncalcified atherosclerotic plaque. Normal caliber aorta without aneurysm, dissection, vasculitis or significant stenosis. Celiac: At least moderate atherosclerotic plaque at its origin. Patent without evidence of aneurysm, dissection, vasculitis or significant stenosis. SMA: At least moderate atherosclerotic plaque at its origin. Patent without evidence of aneurysm, dissection, vasculitis or significant stenosis. Renals: 2 right renal arteries are noted within the inferior pole supplied by the diminutive of the 2 arteries. Is diminutive artery is likely filling retrograde with the origin stenosed by atherosclerotic plaque. 2 left renal arteries are noted. Mild-to-moderate atherosclerotic plaque. No aneurysm, dissection, vasculitis, fibromuscular dysplasia or significant stenosis. IMA: Patent without evidence of aneurysm, dissection, vasculitis or significant stenosis. Inflow: Severe atherosclerotic plaque with the right common iliac artery measuring at the upper limits of normal (1.5 cm). Otherwise patent without evidence of aneurysm, dissection, vasculitis or significant stenosis. Veins: No obvious venous abnormality within the limitations of this arterial phase study. Review of the MIP images confirms the above findings. NON-VASCULAR Hepatobiliary: No adrenal nodule bilaterally. Bilateral kidneys enhance symmetrically. Redemonstration of a punctate right nephrolithiasis. No  hydronephrosis. No hydroureter. The urinary bladder is unremarkable. Pancreas: No focal lesion. Normal pancreatic contour. No surrounding inflammatory changes. No main pancreatic ductal dilatation. Spleen: Normal in size without focal abnormality. Adrenals/Urinary Tract: No adrenal nodule bilaterally. There is a markedly delayed nephrogram of the inferior right renal pole. Remainder of bilateral kidneys enhance symmetrically. Bilateral subcentimeter hypodensities are too small to characterize. No hydronephrosis. No hydroureter. The urinary bladder is distended with urine. Stomach/Bowel: Stomach is within normal limits. No evidence of bowel wall thickening or dilatation. Question few scattered colonic diverticula of the left colon. Appendix appears normal. Lymphatic: No definite lymphadenopathy. Reproductive: Prominent poorly visualized prostate gland. Other: No intraperitoneal free fluid. No intraperitoneal free gas. No organized fluid collection. Musculoskeletal: No abdominal wall hernia or abnormality. No suspicious lytic or blastic osseous lesions. No acute displaced fracture. Review of the MIP images confirms the above findings. IMPRESSION: 1. Right inferior renal pole infarction with thrombosis of the diminutive accessory right renal artery. 2. No thoracic or abdominal aorta aneurysm or dissection in a patient with severe atherosclerosis. Aortic Atherosclerosis (ICD10-I70.0) with four-vessel coronary artery calcifications. 3. Ectatic right common iliac artery (1.5 cm). 4. Other imaging findings of potential clinical significance: Nonobstructive punctate right nephrolithiasis. Emphysema (ICD10-J43.9). These results were called by telephone at the time of interpretation on 06/06/2020 at 12:52 am to provider [facility] , who verbally acknowledged these results. Electronically Signed   By: Iven Finn M.D.   On: 06/06/2020 00:56   ECHOCARDIOGRAM LIMITED  Result Date: 06/06/2020  ECHOCARDIOGRAM LIMITED  REPORT   Patient Name:   NAI DASCH Date of Exam: 06/06/2020 Medical Rec #:  [id number]           Height:       66.0 in Accession #:    [id number]          Weight:       123.5 lb Date of Birth:  [birth year]           BSA:          1.629 m Patient Age:    65 years            BP:           107/71 mmHg Patient Gender: M                   HR:           88 bpm. Exam Location:  Inpatient Procedure: Limited Echo, Limited Color Doppler and Cardiac Doppler Indications:    stroke  History:        Patient has prior history of Echocardiogram examinations, most                 recent 03/30/2020. Risk Factors:Current Smoker, Diabetes,                 Hypertension and polysubstance abuse.  Sonographer:    Johny Chess Referring Phys: [id number] [place]  1. Left ventricular ejection fraction, by estimation, is 60 to 65%. The left ventricle has normal function. The left ventricle has no regional wall motion abnormalities. There is severe left ventricular hypertrophy. Left ventricular diastolic parameters  are consistent with Grade I diastolic dysfunction (impaired relaxation).  2. Right ventricular systolic function is normal. The right ventricular size is normal. There is normal pulmonary artery systolic pressure.  3. Mild mitral valve regurgitation. No evidence of mitral stenosis.  4. The tricuspid valve is abnormal.  5. The aortic valve is tricuspid. There is moderate calcification of the aortic valve. There is moderate thickening of the aortic valve. FINDINGS  Left Ventricle: Left ventricular ejection fraction, by estimation, is 60 to 65%. The left ventricle has normal function. The left ventricle has no regional wall motion abnormalities. The left ventricular internal cavity size was small. There is severe left ventricular hypertrophy. Left ventricular diastolic parameters are consistent with Grade I diastolic dysfunction (impaired relaxation). Right Ventricle: The right ventricular size is normal.  No increase in right ventricular wall thickness. Right ventricular systolic function is normal. There is normal pulmonary artery systolic pressure. The tricuspid regurgitant velocity is 2.34 m/s, and  with an assumed right atrial pressure of 3 mmHg, the estimated right ventricular systolic pressure is 50.0 mmHg. Pericardium: There is no evidence of pericardial effusion. Mitral Valve: Mild mitral valve regurgitation. No evidence of mitral valve stenosis. Tricuspid Valve: Apparent ruptured chordae, looks to be attached to the septal leaflet. The tricuspid valve is abnormal. Aortic Valve: The aortic valve is tricuspid. There is moderate calcification of the aortic valve. There is moderate thickening of the aortic valve. There is moderate aortic valve annular calcification. Pulmonic Valve: The pulmonic valve was not well visualized. Pulmonic valve regurgitation is not visualized. No evidence of pulmonic stenosis. IVC IVC diam: 1.10 cm MITRAL VALVE                TRICUSPID VALVE MV Area (PHT): 4.21 cm     TR Peak grad:   21.9 mmHg MV Decel Time:  180 msec     TR Vmax:        234.00 cm/s MV E velocity: 69.40 cm/s MV A velocity: 111.00 cm/s MV E/A ratio:  0.63 Carlyle Dolly MD Electronically signed by Carlyle Dolly MD Signature Date/Time: 06/06/2020/2:12:24 PM    Final      Subjective: No acute issues or events overnight denies nausea vomiting diarrhea constipation headache fevers or chills.   Discharge Exam: Vitals:   06/09/20 0734 06/09/20 1129  BP: 123/90 (!) 139/91  Pulse: (!) 103 94  Resp: 15   Temp: 98.6 F (37 C) 97.9 F (36.6 C)  SpO2: 100% 99%   Vitals:   06/09/20 0016 06/09/20 0358 06/09/20 0734 06/09/20 1129  BP: (!) 175/91 (!) 162/94 123/90 (!) 139/91  Pulse: (!) 109 (!) 110 (!) 103 94  Resp: 17 19 15    Temp: 98.9 F (37.2 C) 98.9 F (37.2 C) 98.6 F (37 C) 97.9 F (36.6 C)  TempSrc:   Oral Oral  SpO2: 100% 100% 100% 99%  Weight:      Height:        General: Pt is alert,  awake, not in acute distress Cardiovascular: RRR, S1/S2 +, no rubs, no gallops Respiratory: CTA bilaterally, no wheezing, no rhonchi Abdominal: Soft, NT, ND, bowel sounds + Extremities: no edema, no cyanosis    The results of significant diagnostics from this hospitalization (including imaging, microbiology, ancillary and laboratory) are listed below for reference.     Microbiology: Recent Results (from the past 240 hour(s))  SARS CORONAVIRUS 2 (TAT 6-24 HRS) Nasopharyngeal Nasopharyngeal Swab     Status: None   Collection Time: 06/06/20  4:03 AM   Specimen: Nasopharyngeal Swab  Result Value Ref Range Status   SARS Coronavirus 2 NEGATIVE NEGATIVE Final    Comment: (NOTE) SARS-CoV-2 target nucleic acids are NOT DETECTED.  The SARS-CoV-2 RNA is generally detectable in upper and lower respiratory specimens during the acute phase of infection. Negative results do not preclude SARS-CoV-2 infection, do not rule out co-infections with other pathogens, and should not be used as the sole basis for treatment or other patient management decisions. Negative results must be combined with clinical observations, patient history, and epidemiological information. The expected result is Negative.  Fact Sheet for Patients: SugarRoll.be  Fact Sheet for Healthcare Providers: https://www.woods-mathews.com/  This test is not yet approved or cleared by the [place] FDA and  has been authorized for detection and/or diagnosis of SARS-CoV-2 by FDA under an Emergency Use Authorization (EUA). This EUA will remain  in effect (meaning this test can be used) for the duration of the COVID-19 declaration under Se ction 564(b)(1) of the Act, 21 U.S.C. section 360bbb-3(b)(1), unless the authorization is terminated or revoked sooner.  Performed at [facility], [place] [address]  [place] [employer] [place],  [address]      Labs: BNP (last 3 results) No results for  input(s): BNP in the last 8760 hours. Basic Metabolic Panel: Recent Labs  Lab 06/05/20 1822 06/05/20 1843 06/09/20 0204  NA 133* 137 136  K 4.0 4.2 3.5  CL 96* 99 99  CO2 27  --  27  GLUCOSE 351* 331* 94  BUN 10 11 9   CREATININE 1.12 0.90 0.73  CALCIUM 9.3  --  9.0   Liver Function Tests: Recent Labs  Lab 06/05/20 1822 06/09/20 0204  AST 29 19  ALT 18 16  ALKPHOS 62 50  BILITOT 0.6 0.7  PROT 7.8 7.3  ALBUMIN 3.7 3.6  Recent Labs  Lab 06/05/20 1822  LIPASE 26   No results for input(s): AMMONIA in the last 168 hours. CBC: Recent Labs  Lab 06/05/20 1822 06/05/20 1843 06/09/20 0204  WBC 6.1  --  6.4  NEUTROABS 4.0  --   --   HGB 10.5* 10.5* 12.2*  HCT 32.7* 31.0* 36.1*  MCV 90.1  --  85.1  PLT 341  --  330   Cardiac Enzymes: No results for input(s): CKTOTAL, CKMB, CKMBINDEX, TROPONINI in the last 168 hours. BNP: Invalid input(s): POCBNP CBG: Recent Labs  Lab 06/08/20 1958 06/09/20 0018 06/09/20 0359 06/09/20 0736 06/09/20 1131  GLUCAP 170* 130* 78 104* 204*   D-Dimer No results for input(s): DDIMER in the last 72 hours. Hgb A1c No results for input(s): HGBA1C in the last 72 hours. Lipid Profile No results for input(s): CHOL, HDL, LDLCALC, TRIG, CHOLHDL, LDLDIRECT in the last 72 hours. Thyroid function studies No results for input(s): TSH, T4TOTAL, T3FREE, THYROIDAB in the last 72 hours.  Invalid input(s): FREET3 Anemia work up No results for input(s): VITAMINB12, FOLATE, FERRITIN, TIBC, IRON, RETICCTPCT in the last 72 hours. Urinalysis    Component Value Date/Time   COLORURINE STRAW (A) 06/05/2020 2259   APPEARANCEUR CLEAR 06/05/2020 2259   LABSPEC 1.011 06/05/2020 2259   PHURINE 9.0 (H) 06/05/2020 2259   GLUCOSEU >=500 (A) 06/05/2020 2259   HGBUR NEGATIVE 06/05/2020 2259   BILIRUBINUR NEGATIVE 06/05/2020 2259   KETONESUR 5 (A) 06/05/2020 2259   PROTEINUR 100 (A) 06/05/2020 2259   NITRITE NEGATIVE 06/05/2020 2259   LEUKOCYTESUR  NEGATIVE 06/05/2020 2259   Sepsis Labs Invalid input(s): PROCALCITONIN,  WBC,  LACTICIDVEN Microbiology Recent Results (from the past 240 hour(s))  SARS CORONAVIRUS 2 (TAT 6-24 HRS) Nasopharyngeal Nasopharyngeal Swab     Status: None   Collection Time: 06/06/20  4:03 AM   Specimen: Nasopharyngeal Swab  Result Value Ref Range Status   SARS Coronavirus 2 NEGATIVE NEGATIVE Final    Comment: (NOTE) SARS-CoV-2 target nucleic acids are NOT DETECTED.  The SARS-CoV-2 RNA is generally detectable in upper and lower respiratory specimens during the acute phase of infection. Negative results do not preclude SARS-CoV-2 infection, do not rule out co-infections with other pathogens, and should not be used as the sole basis for treatment or other patient management decisions. Negative results must be combined with clinical observations, patient history, and epidemiological information. The expected result is Negative.  Fact Sheet for Patients: SugarRoll.be  Fact Sheet for Healthcare Providers: [email or web address]-[email or web address]  This test is not yet approved or cleared by the [place] FDA and  has been authorized for detection and/or diagnosis of SARS-CoV-2 by FDA under an Emergency Use Authorization (EUA). This EUA will remain  in effect (meaning this test can be used) for the duration of the COVID-19 declaration under Se ction 564(b)(1) of the Act, 21 U.S.C. section 360bbb-3(b)(1), unless the authorization is terminated or revoked sooner.  Performed at [facility], [place] [address] [place], [place] [address]      Time coordinating discharge: Over 30 minutes  SIGNED:   Little Ishikawa, DO [facility] 06/09/2020, 1:38 PM Pager   If 7PM-7AM, please contact night-coverage [email or web address]

## 2020-06-09 NOTE — Progress Notes (Addendum)
Patient states "MRI removed my chain and the Doctor in the ED took my car keys" [facility] and Charge nurse aware. Patient room & belongings searched, neither items found. Patient given number for Risk management.  Chain located as of 1700

## 2020-06-09 NOTE — TOC CAGE-AID Note (Signed)
Transition of Care [facility]) - CAGE-AID Screening   Patient Details  Name: [name] MRN: [id number] Date of Birth: 1955/08/14  Transition [facility]) CM/SW Contact:    Kermit Balo, RN Phone Number: 06/09/2020, 1:55 PM   Clinical Narrative: Patient admits to cocaine use. He was agreeable and accepted the inpatient/ outpatient counseling resources.    CAGE-AID Screening:    Have You Ever Felt You Ought to Cut Down on Your Drinking or Drug Use?: Yes Have People Annoyed You By Critizing Your Drinking Or Drug Use?: Yes Have You Felt Bad Or Guilty About Your Drinking Or Drug Use?: Yes Have You Ever Had a Drink or Used Drugs First Thing In The Morning to Steady Your Nerves or to Get Rid of a Hangover?: Yes CAGE-AID Score: 4  Substance Abuse Education Offered: Yes  Substance abuse interventions: Nurse, children's Counseling

## 2020-06-09 NOTE — Progress Notes (Signed)
Physical Therapy Treatment Patient Details Name: William Acevedo MRN: [id number] DOB: 1955/10/29 Today's Date: 06/09/2020    History of Present Illness 65 y/o male presented to ED on 5/20 with complaints of abdominal pain and vomiting. Had witnessed seizure in ED lasting 2 minutes then had AMS. MRI found multifocal acute ischemia within both cerebral hemispheres and R cerebellum. PMH includes: HTN, HLD, CVA, uncontrolled DM II, renal infarct, major depressive disorder, polysubstance abuse (cocaine and marijuana), multiple recent admissions (3/9-3/14, 3/17-3/20, 3/22-3/23, and 3/26) for abdominal pain, shingles, and multifocal acute infarcts.    PT Comments    Pt much improved from eval last week. No ataxia noted with gait, pt ambulated 200' with min-guard A and no AD. He also ascended and descended a flight of stairs with B rails without physical assist. Pt continues to have abdominal discomfort and nausea which he is concerned about having meds for. Based on mobility and independence today changing d/c rec to home without services.     Follow Up Recommendations  No PT follow up     Equipment Recommendations  None recommended by PT    Recommendations for Other Services       Precautions / Restrictions Precautions Precautions: Fall Precaution Comments: possible shingles? nursing aware Restrictions Weight Bearing Restrictions: No    Mobility  Bed Mobility Overal bed mobility: Modified Independent                  Transfers Overall transfer level: Needs assistance Equipment used: None Transfers: Sit to/from Stand Sit to Stand: Supervision         General transfer comment: Supervision for safety  Ambulation/Gait Ambulation/Gait assistance: Min assist;Min guard Gait Distance (Feet): 200 Feet Assistive device: None Gait Pattern/deviations: Step-through pattern;Decreased stride length;Drifts right/left Gait velocity: decreased Gait velocity interpretation: 1.31  - 2.62 ft/sec, indicative of limited community ambulator General Gait Details: pt ambulated with decreased pace and slight trunk flexion but no ataxia noted today. Min A for safety progressing to min-guard A. No LOB. Pt able to pathfind back to room   Stairs Stairs: Yes Stairs assistance: Supervision Stair Management: Two rails;Alternating pattern;Forwards Number of Stairs: 10 General stair comments: pt reliant on rail but no physical assist needed up or down   Wheelchair Mobility    Modified Rankin (Stroke Patients Only) Modified Rankin (Stroke Patients Only) Pre-Morbid Rankin Score: No significant disability Modified Rankin: Moderate disability     Balance Overall balance assessment: Needs assistance Sitting-balance support: No upper extremity supported;Feet supported Sitting balance-Leahy Scale: Good     Standing balance support: No upper extremity supported;During functional activity Standing balance-Leahy Scale: Good Standing balance comment: pt able to bend fwd and roll up pant leg without UE support and no LOB                            Cognition Arousal/Alertness: Awake/alert Behavior During Therapy: WFL for tasks assessed/performed;Flat affect Overall Cognitive Status: Within Functional Limits for tasks assessed                                 General Comments: Slight flat affect. Benefiting from increased time and quiet environment.      Exercises      General Comments General comments (skin integrity, edema, etc.): pt fatigued after 100' ambulation and requested to turn back to room      Pertinent Vitals/Pain Pain Assessment:  Faces Faces Pain Scale: Hurts a little bit Pain Location: abdomen Pain Descriptors / Indicators: Discomfort Pain Intervention(s): Monitored during session    Home Living                      Prior Function            PT Goals (current goals can now be found in the care plan section) Acute  Rehab PT Goals Patient Stated Goal: to get betterr PT Goal Formulation: With patient Time For Goal Achievement: 06/20/20 Potential to Achieve Goals: Good Progress towards PT goals: Progressing toward goals    Frequency    Min 4X/week      PT Plan Discharge plan needs to be updated    Acevedo-evaluation              AM-PAC PT "6 Clicks" Mobility   Outcome Measure  Help needed turning from your back to your side while in a flat bed without using bedrails?: None Help needed moving from lying on your back to sitting on the side of a flat bed without using bedrails?: None Help needed moving to and from a bed to a chair (including a wheelchair)?: None Help needed standing up from a chair using your arms (e.g., wheelchair or bedside chair)?: None Help needed to walk in hospital room?: A Little Help needed climbing 3-5 steps with a railing? : A Little 6 Click Score: 22    End of Session   Activity Tolerance: Patient tolerated treatment well Patient left: in bed;with call bell/phone within reach Nurse Communication: Mobility status PT Visit Diagnosis: Unsteadiness on feet (R26.81);Muscle weakness (generalized) (M62.81);Ataxic gait (R26.0)     Time: 2993-7169 PT Time Calculation (min) (ACUTE ONLY): 12 min  Charges:  $Gait Training: 8-22 mins                     William Acevedo, PT  Acute Rehab Services  Pager [phone] Office [phone]    William Acevedo William Acevedo 06/09/2020, 4:14 PM

## 2020-06-09 NOTE — Progress Notes (Signed)
OT Cancellation Note  Patient Details Name: William Acevedo MRN: [id number] DOB: 06-Aug-1955   Cancelled Treatment:    Reason Eval/Treat Not Completed: Other (comment) (Pt requesting "I havent had breakfast. Can you come back after breakfast?" Pt's breakfast in room; pt declined OOB for breakfast. Positioning tray on table with pt in bed. Will return as schedule allows.)  Akshar Starnes M Keeon Zurn Khyleigh Furney MSOT, OTR/L Acute Rehab Pager: [phone] Office: [phone] 06/09/2020, 9:09 AM

## 2020-06-09 NOTE — Plan of Care (Signed)
Problem: Education: Goal: Knowledge of General Education information will improve Description: Including pain rating scale, medication(s)/side effects and non-pharmacologic comfort measures Outcome: Progressing   Problem: Health Behavior/Discharge Planning: Goal: Ability to manage health-related needs will improve Outcome: Progressing   Problem: Clinical Measurements: Goal: Ability to maintain clinical measurements within normal limits will improve Outcome: Progressing Goal: Will remain free from infection Outcome: Progressing Goal: Diagnostic test results will improve Outcome: Progressing Goal: Respiratory complications will improve Outcome: Progressing Goal: Cardiovascular complication will be avoided Outcome: Progressing   Problem: Activity: Goal: Risk for activity intolerance will decrease Outcome: Progressing   Problem: Nutrition: Goal: Adequate nutrition will be maintained Outcome: Progressing   Problem: Coping: Goal: Level of anxiety will decrease Outcome: Progressing   Problem: Elimination: Goal: Will not experience complications related to bowel motility Outcome: Progressing Goal: Will not experience complications related to urinary retention Outcome: Progressing   Problem: Pain Managment: Goal: General experience of comfort will improve Outcome: Progressing   Problem: Safety: Goal: Ability to remain free from injury will improve Outcome: Progressing   Problem: Skin Integrity: Goal: Risk for impaired skin integrity will decrease Outcome: Progressing   Problem: Education: Goal: Expressions of having a comfortable level of knowledge regarding the disease process will increase Outcome: Progressing   Problem: Coping: Goal: Ability to adjust to condition or change in health will improve Outcome: Progressing Goal: Ability to identify appropriate support needs will improve Outcome: Progressing   Problem: Health Behavior/Discharge Planning: Goal:  Compliance with prescribed medication regimen will improve Outcome: Progressing   Problem: Medication: Goal: Risk for medication side effects will decrease Outcome: Progressing   Problem: Clinical Measurements: Goal: Complications related to the disease process, condition or treatment will be avoided or minimized Outcome: Progressing Goal: Diagnostic test results will improve Outcome: Progressing   Problem: Safety: Goal: Verbalization of understanding the information provided will improve Outcome: Progressing   Problem: Self-Concept: Goal: Level of anxiety will decrease Outcome: Progressing Goal: Ability to verbalize feelings about condition will improve Outcome: Progressing   Problem: Education: Goal: Knowledge of disease or condition will improve 06/09/2020 0549 by Abelina Bachelor, RN Outcome: Progressing 06/08/2020 2352 by Abelina Bachelor, RN Outcome: Progressing Goal: Knowledge of secondary prevention will improve 06/09/2020 0549 by Abelina Bachelor, RN Outcome: Progressing 06/08/2020 2352 by Abelina Bachelor, RN Outcome: Progressing Goal: Knowledge of patient specific risk factors addressed and post discharge goals established will improve 06/09/2020 0549 by Abelina Bachelor, RN Outcome: Progressing 06/08/2020 2352 by Abelina Bachelor, RN Outcome: Progressing Goal: Individualized Educational Video(s) 06/09/2020 0549 by Abelina Bachelor, RN Outcome: Progressing 06/08/2020 2352 by Abelina Bachelor, RN Outcome: Progressing   Problem: Coping: Goal: Will verbalize positive feelings about self 06/09/2020 0549 by Abelina Bachelor, RN Outcome: Progressing 06/08/2020 2352 by Abelina Bachelor, RN Outcome: Progressing Goal: Will identify appropriate support needs 06/09/2020 0549 by Abelina Bachelor, RN Outcome: Progressing 06/08/2020 2352 by Abelina Bachelor, RN Outcome: Progressing   Problem: Health Behavior/Discharge Planning: Goal: Ability to manage health-related needs will improve 06/09/2020 0549 by  Abelina Bachelor, RN Outcome: Progressing 06/08/2020 2352 by Abelina Bachelor, RN Outcome: Progressing   Problem: Self-Care: Goal: Ability to participate in self-care as condition permits will improve 06/09/2020 0549 by Abelina Bachelor, RN Outcome: Progressing 06/08/2020 2352 by Abelina Bachelor, RN Outcome: Progressing Goal: Verbalization of feelings and concerns over difficulty with self-care will improve 06/09/2020 0549 by Abelina Bachelor, RN Outcome: Progressing 06/08/2020 2352 by Abelina Bachelor, RN Outcome: Progressing Goal: Ability to communicate needs accurately will improve 06/09/2020 0549 by Abelina Bachelor, RN Outcome:  Progressing 06/08/2020 2352 by Abelina Bachelor, RN Outcome: Progressing   Problem: Nutrition: Goal: Risk of aspiration will decrease Outcome: Progressing Goal: Dietary intake will improve 06/09/2020 0549 by Abelina Bachelor, RN Outcome: Progressing 06/08/2020 2352 by Abelina Bachelor, RN Outcome: Progressing   Problem: Intracerebral Hemorrhage Tissue Perfusion: Goal: Complications of Intracerebral Hemorrhage will be minimized 06/09/2020 0549 by Abelina Bachelor, RN Outcome: Progressing 06/08/2020 2352 by Abelina Bachelor, RN Outcome: Progressing   Problem: Ischemic Stroke/TIA Tissue Perfusion: Goal: Complications of ischemic stroke/TIA will be minimized 06/09/2020 0549 by Abelina Bachelor, RN Outcome: Progressing 06/08/2020 2352 by Abelina Bachelor, RN Outcome: Progressing   Problem: Spontaneous Subarachnoid Hemorrhage Tissue Perfusion: Goal: Complications of Spontaneous Subarachnoid Hemorrhage will be minimized 06/09/2020 0549 by Abelina Bachelor, RN Outcome: Progressing 06/08/2020 2352 by Abelina Bachelor, RN Outcome: Progressing

## 2020-06-09 NOTE — Progress Notes (Signed)
Occupational Therapy Treatment Patient Details Name: William Acevedo MRN: [id number] DOB: 07-Jan-1956 Today's Date: 06/09/2020    History of present illness 65 y/o male presented to ED on 5/20 with complaints of abdominal pain and vomiting. Had witnessed seizure in ED lasting 2 minutes then had AMS. MRI found multifocal acute ischemia within both cerebral hemispheres and R cerebellum. PMH includes: HTN, HLD, CVA, uncontrolled DM II, renal infarct, major depressive disorder, polysubstance abuse (cocaine and marijuana), multiple recent admissions (3/9-3/14, 3/17-3/20, 3/22-3/23, and 3/26) for abdominal pain, shingles, and multifocal acute infarcts.   OT comments  Pt progressing towards established OT goals. Pt performing grooming, dressing, and functional mobility at Supervision level. Pt demonstrating increased balance, strength, and activity tolerance. 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 Due to change in functional status, update dc recommendation to home once medically stable per physician. Will continue to follow acutely as admitted.    Follow Up Recommendations  No OT follow up;Supervision - Intermittent    Equipment Recommendations       Recommendations for Other Services      Precautions / Restrictions Precautions Precautions: Fall       Mobility Bed Mobility Overal bed mobility: Modified Independent                  Transfers Overall transfer level: Needs assistance Equipment used: None Transfers: Sit to/from Stand Sit to Stand: Supervision         General transfer comment: Supervision for safety    Balance Overall balance assessment: Needs assistance Sitting-balance support: No upper extremity supported;Feet supported Sitting balance-Leahy Scale: Good     Standing balance support: No upper extremity supported;During functional activity Standing balance-Leahy Scale: Good                             ADL  either performed or assessed with clinical judgement   ADL Overall ADL's : Needs assistance/impaired Eating/Feeding: Supervision/ safety;Bed level   Grooming: Set up;Supervision/safety;Standing;Oral care Grooming Details (indicate cue type and reason): Supervision for safety         Upper Body Dressing : Supervision/safety;Set up;Sitting   Lower Body Dressing: Supervision/safety;Set up;Sit to/from stand Lower Body Dressing Details (indicate cue type and reason): Supervision for safety Toilet Transfer: Supervision/safety;Ambulation (simulated in room)           Functional mobility during ADLs: Supervision/safety General ADL Comments: Supervision for safety during ADLs and functional mobility     Vision       Perception     Praxis      Cognition Arousal/Alertness: Awake/alert Behavior During Therapy: WFL for tasks assessed/performed Overall Cognitive Status: Within Functional Limits for tasks assessed                                 General Comments: Slight flat affect. Benefiting from increased time and quiet environment.        Exercises     Shoulder Instructions       General Comments      Pertinent Vitals/ Pain       Pain Assessment: Faces Faces Pain Scale: Hurts little more Pain Location: abdomen Pain Descriptors / Indicators: Aching;Discomfort Pain Intervention(s): Monitored during session;Repositioned  Home Living  Prior Functioning/Environment              Frequency  Min 2X/week        Progress Toward Goals  OT Goals(current goals can now be found in the care plan section)  Progress towards OT goals: Progressing toward goals  Acute Rehab OT Goals Patient Stated Goal: to go inpatient OT Goal Formulation: With patient Time For Goal Achievement: 06/20/20 Potential to Achieve Goals: Good ADL Goals Pt Will Perform Upper Body Dressing: with modified  independence Pt Will Perform Lower Body Dressing: with modified independence Pt Will Transfer to Toilet: with modified independence Pt Will Perform Toileting - Clothing Manipulation and hygiene: with modified independence Pt Will Perform Tub/Shower Transfer: with modified independence  Plan Discharge plan needs to be updated    Co-evaluation    PT/OT/SLP Co-Evaluation/Treatment:  (Dove tail for pt tolerance and motivation)            AM-PAC OT "6 Clicks" Daily Activity     Outcome Measure   Help from another person eating meals?: A Little Help from another person taking care of personal grooming?: A Little Help from another person toileting, which includes using toliet, bedpan, or urinal?: A Little Help from another person bathing (including washing, rinsing, drying)?: A Little Help from another person to put on and taking off regular upper body clothing?: A Little Help from another person to put on and taking off regular lower body clothing?: A Little 6 Click Score: 18    End of Session    OT Visit Diagnosis: Unsteadiness on feet (R26.81);Ataxia, unspecified (R27.0)   Activity Tolerance Patient tolerated treatment well   Patient Left  (in hallway with PT)   Nurse Communication Mobility status        Time: 2409-7353 OT Time Calculation (min): 23 min  Charges: OT General Charges $OT Visit: 1 Visit OT Treatments $Self Care/Home Management : 8-22 mins  Chauntae Hults MSOT, OTR/L Acute Rehab Pager: [phone] Office: [phone]   Theodoro Grist Yosselin Zoeller 06/09/2020, 1:28 PM

## 2020-06-09 NOTE — TOC Transition Note (Signed)
Transition of Care [facility]) - CM/SW Discharge Note   Patient Details  Name: William Acevedo MRN: [id number] Date of Birth: [birth year]  Transition of Care [facility]) CM/SW Contact:  Kermit Balo, RN Phone Number: 06/09/2020, 1:52 PM   Clinical Narrative:    Patient did better with therapy today and can d/c home with self care. Pt states he has transport home. Pt concerned about nausea at home. CM asked MD to please refill his zofran he uses at home.   Final next level of care: Home/Self Care Barriers to Discharge: No Barriers Identified   Patient Goals and CMS Choice        Discharge Placement                       Discharge Plan and Services                                     Social Determinants of Health (SDOH) Interventions     Readmission Risk Interventions Readmission Risk Prevention Plan 04/19/2020  Transportation Screening Complete  Medication Review (RN Care Manager) Complete  PCP or Specialist appointment within 3-5 days of discharge Complete  HRI or Home Care Consult Complete  SW Recovery Care/Counseling Consult Complete  Palliative Care Screening Not Applicable  Skilled Nursing Facility Not Applicable  Some recent data might be hidden

## 2020-07-07 ENCOUNTER — Other Ambulatory Visit: Payer: Self-pay

## 2020-07-07 ENCOUNTER — Ambulatory Visit (HOSPITAL_COMMUNITY)
Admission: EM | Admit: 2020-07-07 | Discharge: 2020-07-07 | Disposition: A | Discharge status: Home / Self Care | Payer: Medicare Other | Attending: Professional | Admitting: Professional

## 2020-07-07 DIAGNOSIS — F1420 Cocaine dependence, uncomplicated: Secondary | ICD-10-CM | POA: Diagnosis not present

## 2020-07-07 DIAGNOSIS — F4310 Post-traumatic stress disorder, unspecified: Secondary | ICD-10-CM | POA: Diagnosis not present

## 2020-07-07 DIAGNOSIS — E119 Type 2 diabetes mellitus without complications: Secondary | ICD-10-CM | POA: Insufficient documentation

## 2020-07-07 NOTE — Discharge Instructions (Addendum)
Follow up Emergency department if symptoms worsen. [facility]  Patient is instructed prior to discharge to: Take all medications as prescribed by his/her mental healthcare provider. Report any adverse effects and or reactions from the medicines to his/her outpatient provider promptly. Patient has been instructed & cautioned: To not engage in alcohol and or illegal drug use while on prescription medicines. In the event of worsening symptoms, patient is instructed to call the crisis hotline, 911 and or go to the nearest ED for appropriate evaluation and treatment of symptoms. To follow-up with his/her primary care provider for your other medical issues, concerns and or health care needs.
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
## 2020-07-07 NOTE — Progress Notes (Signed)
   07/07/20 1532  [facility] Triage Screening (Walk-ins at [facility] only)  How Did You Hear About [place]? Legal System  What Is the Reason for Your Visit/Call Today? Patient presents via [facility] under IVC intitiated by brother.  Per IVC patient has been decompensating, isn't caring for basic needs and has been impulsive recently.  Patient denies IVC allegations.  He states he was at the [facility] for medical reasons and feels his brother IVC's him "as a control mechanism."  Patient lives with brother and states they normally get along okay.  He denies SI, HI and AVH.  He does use substances, however is unclear about most recent use.  How Long Has This Been Causing You Problems? <Week  Have You Recently Had Any Thoughts About Hurting Yourself? No  Are You Planning to Commit Suicide/Harm Yourself At This time? No  Have you Recently Had Thoughts About Hurting Someone Karolee Ohs? No  Are You Planning To Harm Someone At This Time? No  Are you currently experiencing any auditory, visual or other hallucinations? No  Have You Used Any Alcohol or Drugs in the Past 24 Hours? Yes  How long ago did you use Drugs or Alcohol? unclear  What Did You Use and How Much? meth/THC use  Do you have any current medical co-morbidities that require immediate attention? No  Clinician description of patient physical appearance/behavior: Patient is somewhat guarded, frustated his brother filed IVC.  What Do You Feel Would Help You the Most Today? Treatment for Depression or other mood problem  If access to [facility] was not available, would you have sought care in the Emergency Department? Yes  Determination of Need Urgent (48 hours)  Options For Referral [facility];Outpatient Therapy;Medication Management

## 2020-07-07 NOTE — Discharge Summary (Signed)
William Acevedo to be D/C'd Home per NP order. Discussed with the patient and all questions fully answered. An After Visit Summary was printed and given to the patient.  Patient escorted out and D/C home via safe transport. Dickie La  07/07/2020 6:28 PM

## 2020-07-09 ENCOUNTER — Emergency Department (HOSPITAL_COMMUNITY): Payer: No Typology Code available for payment source

## 2020-07-09 ENCOUNTER — Inpatient Hospital Stay (HOSPITAL_COMMUNITY)
Admission: EM | Admit: 2020-07-09 | Discharge: 2020-07-13 | DRG: 065 | Disposition: A | Discharge status: Home / Self Care | Payer: No Typology Code available for payment source | Attending: Internal Medicine | Admitting: Internal Medicine

## 2020-07-09 ENCOUNTER — Encounter (HOSPITAL_COMMUNITY): Payer: Self-pay | Admitting: Emergency Medicine

## 2020-07-09 DIAGNOSIS — Z7982 Long term (current) use of aspirin: Secondary | ICD-10-CM | POA: Diagnosis not present

## 2020-07-09 DIAGNOSIS — Z79899 Other long term (current) drug therapy: Secondary | ICD-10-CM

## 2020-07-09 DIAGNOSIS — R112 Nausea with vomiting, unspecified: Secondary | ICD-10-CM | POA: Diagnosis present

## 2020-07-09 DIAGNOSIS — Z885 Allergy status to narcotic agent status: Secondary | ICD-10-CM | POA: Diagnosis not present

## 2020-07-09 DIAGNOSIS — R410 Disorientation, unspecified: Secondary | ICD-10-CM | POA: Diagnosis present

## 2020-07-09 DIAGNOSIS — R778 Other specified abnormalities of plasma proteins: Secondary | ICD-10-CM | POA: Diagnosis present

## 2020-07-09 DIAGNOSIS — E860 Dehydration: Secondary | ICD-10-CM | POA: Diagnosis present

## 2020-07-09 DIAGNOSIS — E119 Type 2 diabetes mellitus without complications: Secondary | ICD-10-CM | POA: Diagnosis present

## 2020-07-09 DIAGNOSIS — Z7984 Long term (current) use of oral hypoglycemic drugs: Secondary | ICD-10-CM

## 2020-07-09 DIAGNOSIS — R29702 NIHSS score 2: Secondary | ICD-10-CM | POA: Diagnosis present

## 2020-07-09 DIAGNOSIS — D649 Anemia, unspecified: Secondary | ICD-10-CM | POA: Diagnosis present

## 2020-07-09 DIAGNOSIS — Z8673 Personal history of transient ischemic attack (TIA), and cerebral infarction without residual deficits: Secondary | ICD-10-CM | POA: Diagnosis not present

## 2020-07-09 DIAGNOSIS — I2510 Atherosclerotic heart disease of native coronary artery without angina pectoris: Secondary | ICD-10-CM | POA: Diagnosis present

## 2020-07-09 DIAGNOSIS — R627 Adult failure to thrive: Secondary | ICD-10-CM

## 2020-07-09 DIAGNOSIS — Z794 Long term (current) use of insulin: Secondary | ICD-10-CM

## 2020-07-09 DIAGNOSIS — F1910 Other psychoactive substance abuse, uncomplicated: Secondary | ICD-10-CM | POA: Diagnosis present

## 2020-07-09 DIAGNOSIS — E785 Hyperlipidemia, unspecified: Secondary | ICD-10-CM | POA: Diagnosis present

## 2020-07-09 DIAGNOSIS — I10 Essential (primary) hypertension: Secondary | ICD-10-CM | POA: Diagnosis present

## 2020-07-09 DIAGNOSIS — I6310 Cerebral infarction due to embolism of unspecified precerebral artery: Secondary | ICD-10-CM | POA: Diagnosis not present

## 2020-07-09 DIAGNOSIS — Z20822 Contact with and (suspected) exposure to covid-19: Secondary | ICD-10-CM | POA: Diagnosis present

## 2020-07-09 DIAGNOSIS — D638 Anemia in other chronic diseases classified elsewhere: Secondary | ICD-10-CM | POA: Diagnosis not present

## 2020-07-09 DIAGNOSIS — R109 Unspecified abdominal pain: Secondary | ICD-10-CM | POA: Diagnosis not present

## 2020-07-09 DIAGNOSIS — I252 Old myocardial infarction: Secondary | ICD-10-CM

## 2020-07-09 DIAGNOSIS — Z888 Allergy status to other drugs, medicaments and biological substances status: Secondary | ICD-10-CM | POA: Diagnosis not present

## 2020-07-09 DIAGNOSIS — Z681 Body mass index (BMI) 19 or less, adult: Secondary | ICD-10-CM | POA: Diagnosis not present

## 2020-07-09 DIAGNOSIS — Z7902 Long term (current) use of antithrombotics/antiplatelets: Secondary | ICD-10-CM | POA: Diagnosis not present

## 2020-07-09 DIAGNOSIS — E118 Type 2 diabetes mellitus with unspecified complications: Secondary | ICD-10-CM | POA: Diagnosis not present

## 2020-07-09 DIAGNOSIS — R64 Cachexia: Secondary | ICD-10-CM | POA: Diagnosis present

## 2020-07-09 DIAGNOSIS — E1159 Type 2 diabetes mellitus with other circulatory complications: Secondary | ICD-10-CM

## 2020-07-09 DIAGNOSIS — G8929 Other chronic pain: Secondary | ICD-10-CM | POA: Diagnosis present

## 2020-07-09 DIAGNOSIS — I639 Cerebral infarction, unspecified: Secondary | ICD-10-CM | POA: Diagnosis present

## 2020-07-09 DIAGNOSIS — F17210 Nicotine dependence, cigarettes, uncomplicated: Secondary | ICD-10-CM | POA: Diagnosis present

## 2020-07-09 DIAGNOSIS — F1420 Cocaine dependence, uncomplicated: Secondary | ICD-10-CM | POA: Diagnosis present

## 2020-07-09 DIAGNOSIS — E1169 Type 2 diabetes mellitus with other specified complication: Secondary | ICD-10-CM | POA: Diagnosis not present

## 2020-07-09 DIAGNOSIS — I6389 Other cerebral infarction: Secondary | ICD-10-CM | POA: Diagnosis not present

## 2020-07-09 DIAGNOSIS — R1084 Generalized abdominal pain: Secondary | ICD-10-CM | POA: Diagnosis not present

## 2020-07-09 DIAGNOSIS — F1410 Cocaine abuse, uncomplicated: Secondary | ICD-10-CM | POA: Diagnosis not present

## 2020-07-09 DIAGNOSIS — F1490 Cocaine use, unspecified, uncomplicated: Secondary | ICD-10-CM | POA: Diagnosis present

## 2020-07-09 LAB — BASIC METABOLIC PANEL
Anion gap: 14 (ref 5–15)
BUN: 16 mg/dL (ref 8–23)
CO2: 28 mmol/L (ref 22–32)
Calcium: 9.1 mg/dL (ref 8.9–10.3)
Chloride: 95 mmol/L — ABNORMAL LOW (ref 98–111)
Creatinine, Ser: 1.14 mg/dL (ref 0.61–1.24)
GFR, Estimated: >60 mL/min (ref >60)
Glucose, Bld: 174 mg/dL — ABNORMAL HIGH (ref 70–99)
Potassium: 3.6 mmol/L (ref 3.5–5.1)
Sodium: 137 mmol/L (ref 135–145)

## 2020-07-09 LAB — RAPID HIV SCREEN (HIV 1/2 AB+AG)
HIV 1/2 Antibodies: NONREACTIVE
HIV-1 P24 Antigen - HIV24: NONREACTIVE

## 2020-07-09 LAB — CBC
HCT: 38.3 % — ABNORMAL LOW (ref 39.0–52.0)
Hemoglobin: 12.4 g/dL — ABNORMAL LOW (ref 13.0–17.0)
MCH: 28.9 pg (ref 26.0–34.0)
MCHC: 32.4 g/dL (ref 30.0–36.0)
MCV: 89.3 fL (ref 80.0–100.0)
Platelets: 308 10*3/uL (ref 150–400)
RBC: 4.29 MIL/uL (ref 4.22–5.81)
RDW: 13 % (ref 11.5–15.5)
WBC: 5.7 10*3/uL (ref 4.0–10.5)
nRBC: 0 % (ref 0.0–0.2)

## 2020-07-09 LAB — TROPONIN I (HIGH SENSITIVITY)
Troponin I (High Sensitivity): 31 ng/L — ABNORMAL HIGH (ref <18)
Troponin I (High Sensitivity): 35 ng/L — ABNORMAL HIGH (ref <18)
Troponin I (High Sensitivity): 36 ng/L — ABNORMAL HIGH (ref <18)

## 2020-07-09 MED ORDER — ACETAMINOPHEN 325 MG PO TABS
650.0000 mg | ORAL_TABLET | ORAL | Status: DC | PRN
  Administered 2020-07-10: 650 mg via ORAL
  Filled 2020-07-09 (×3): qty 2

## 2020-07-09 MED ORDER — ACETAMINOPHEN 650 MG RE SUPP: 650.0000 mg | RECTAL | Status: DC | PRN

## 2020-07-09 MED ORDER — STROKE: EARLY STAGES OF RECOVERY BOOK
Freq: Once | Status: AC
  Filled 2020-07-09: qty 1

## 2020-07-09 MED ORDER — ACETAMINOPHEN 160 MG/5ML PO SOLN: 650.0000 mg | ORAL | Status: DC | PRN

## 2020-07-09 MED ORDER — INSULIN ASPART 100 UNIT/ML IJ SOLN
0.0000 [IU] | Freq: Three times a day (TID) | INTRAMUSCULAR | Status: DC
  Administered 2020-07-10: 3 [IU] via SUBCUTANEOUS
  Administered 2020-07-10: 2 [IU] via SUBCUTANEOUS
  Administered 2020-07-11: 5 [IU] via SUBCUTANEOUS
  Administered 2020-07-12: 2 [IU] via SUBCUTANEOUS
  Administered 2020-07-12: 1 [IU] via SUBCUTANEOUS
  Administered 2020-07-12 – 2020-07-13 (×3): 2 [IU] via SUBCUTANEOUS

## 2020-07-09 MED ORDER — SODIUM CHLORIDE 0.9 % IV SOLN: Freq: Once | INTRAVENOUS | Status: AC

## 2020-07-09 NOTE — ED Notes (Signed)
[facility] called for transport to [facility].  

## 2020-07-09 NOTE — ED Triage Notes (Addendum)
Patient arrived via [facility] from home.   Called out for failure to thrive X3 months. Patient has not been eating. Anorexia per brother.   C/o feeling tired  Lives with his brother.   Hx: psychiatric problems and chronic substance use.  Hx; Diabetes and stroke.    Used crack yesterday. Denies use today.    A/Ox4 Ambulatory with ems   BP-200/110 P-96 RR-20 100% RA  CBG-182   Denies pain, shob, N/v, or diarrhea.   Brother reports he has been combative.  EMS states he is not combative with them.
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
## 2020-07-09 NOTE — Consult Note (Signed)
Neurology Consultation Reason for Consult: Recurrent stroke Referring Physician: Ronaldo Miyamoto, T  CC: Abdominal pain  History is obtained from: Patient  HPI: William Acevedo is a 65 y.o. male with a history of diabetes, hypertension who has presented recurrently with strokes felt to be associated with cocaine use.  He was found to be having nausea and vomiting and abdominal pain yesterday and was brought into the [facility] emergency department.  Has been declining for the past 6 months or so per the ER notes.  At that time, and states that he had no abdominal pain.  Due to disorientation a CT was performed which shows expected evolution of the strokes known about from May as well as a new left frontal subcortical hypodensity most consistent with stroke.  He states that he continues to use cocaine, using almost daily.  LKW: Unclear tpa given?: no, recent stroke   ROS: A 14 point ROS was performed and is negative except as noted in the HPI.  Past Medical History:  Diagnosis Date   Carpal tunnel syndrome    Depression    Diabetes mellitus without complication (HCC)    Hypertensive urgency 04/14/2020   Neuropathy      Family history: No history of similar   Social History:  reports that he has been smoking cigarettes. He has been smoking an average of 0.50 packs per day. He has never used smokeless tobacco. He reports current alcohol use. He reports current drug use. Drug: Cocaine.   Exam: Current vital signs: BP (!) 144/102 (BP Location: Right Arm)   Pulse 92   Temp 99.2 F (37.3 C) (Oral)   Resp 17   Ht 5\' 6"  (1.676 m)   Wt 44 kg   SpO2 100%   BMI 15.66 kg/m  Vital signs in last 24 hours: Temp:  [98.4 F (36.9 C)-99.2 F (37.3 C)] 99.2 F (37.3 C) (06/23 2049) Pulse Rate:  [81-108] 92 (06/23 2049) Resp:  [15-18] 17 (06/23 2049) BP: (131-177)/(71-102) 144/102 (06/23 2049) SpO2:  [96 %-100 %] 100 % (06/23 2049) Weight:  [44 kg] 44 kg (06/23 1844)   Physical Exam   Constitutional: Appears well-developed and well-nourished.  Psych: Affect appropriate to situation Eyes: No scleral injection HENT: No OP obstruction MSK: no joint deformities.  Cardiovascular: Normal rate and regular rhythm.  Respiratory: Effort normal, non-labored breathing GI: Soft.  He has diffuse tenderness to palpation Skin: WDI  Neuro: Mental Status: Patient is awake, alert, gives the month is January and is unable to give the year, gives his location is [facility] (correct). patient is able to give a clear and coherent history. No signs of aphasia or neglect Cranial Nerves: II: Visual Fields are full. Pupils are equal, round, and reactive to light.   III,IV, VI: EOMI without ptosis or diploplia.  V: Facial sensation is symmetric to temperature VII: Facial movement is symmetric.  VIII: hearing is intact to voice X: Uvula elevates symmetrically XI: Shoulder shrug is symmetric. XII: tongue is midline without atrophy or fasciculations.  Motor: He has mild drift of his right upper extremity, 4/5 strength of the right, 5/5 on the left Sensory: Sensation is symmetric to light touch and temperature in the arms and legs Cerebellar: No clear ataxia   I have reviewed labs in epic and the results pertinent to this consultation are: UDS-pending BMP-unremarkable  I have reviewed the images obtained: CT head-new hypodensity in the left  Impression: 65 year old male who presents with recurrent strokes in the  setting of daily cocaine use.  I have advised him to quit using cocaine, and he assures me that he will not use any more.  With multifocal embolic disease, as well as abdominal pain, I think that mesenteric ischemia may need to be considered if no other etiology for his abdominal pain is found, would consider this as a possibility.  Recommendations: 1) aspirin 81 mg daily 2) repeat MRI of the brain 3 ) CTA head neck 4) stroke team to follow   Ritta Slot,  MD [facility] [phone]  If 7pm- 7am, please page neurology on call as listed in AMION.

## 2020-07-09 NOTE — Progress Notes (Signed)
..  Transition of Care [facility]) - Emergency Department Mini Assessment   Patient Details  Name: William Acevedo MRN: [id number] Date of Birth: Apr 20, 1955  Transition of Care [facility]) CM/SW Contact:    Larrie Kass, LCSW Phone Number:[phone] 07/09/2020, 2:30 PM   Clinical Narrative:  CSW spoke with patient about SNF placement, patient agreed to placement and gave permission to speak with his brother.CSW continue to follow.   ED Mini Assessment: What brought you to the Emergency Department? : failure to thrive  Barriers to Discharge: No Barriers Identified        Interventions which prevented an admission or readmission: SNF Placement    Patient Contact and Communications        ,                 Admission diagnosis:  failure to thrive;lethargy Patient Active Problem List   Diagnosis Date Noted   Acute ischemic multifocal multiple vascular territories stroke (HCC) 06/06/2020   Seizure-like activity (HCC) 06/06/2020   Cocaine abuse (HCC)    ARF (acute renal failure) (HCC) 04/14/2020   Acute blood loss anemia 04/14/2020   GI bleed 04/14/2020   Nausea & vomiting 04/14/2020   Acute metabolic encephalopathy 04/14/2020   Hypertensive urgency 04/14/2020   Abdominal pain 04/02/2020   Protein-calorie malnutrition, severe 03/27/2020   Cerebral thrombosis with cerebral infarction 03/26/2020   NSTEMI (non-ST elevated myocardial infarction) (HCC) 03/25/2020   Secondary diabetes mellitus with HHNC (hyperglycemia hyperosmolar non-ketotic coma) (HCC) 03/25/2020   Elevated troponin    Renal infarction [facility])    Cerebrovascular accident (CVA) (HCC)    Cocaine use disorder, severe, dependence (HCC) 04/08/2019   Major depressive disorder 04/08/2019   Epiglottitis 04/07/2019   Cocaine use 04/07/2019   Acute encephalopathy 04/07/2019   Overdose 07/06/2015   AKI (acute kidney injury) (HCC) 07/06/2015   Polysubstance abuse (HCC) 07/06/2015   DM2 (diabetes  mellitus, type 2) (HCC) 07/06/2015   Dehydration    MDD (major depressive disorder), recurrent episode, mild (HCC)    Uncomplicated alcohol dependence (HCC)    PCP:  Pcp, No Pharmacy:   [id number] [place], [place] - [address] [place] [address] Phone: [phone] Fax: [phone]  [id number] [place], [place] - [address] AT [facility] [address] [place] [id number] Phone: [phone] Fax: [phone]

## 2020-07-09 NOTE — ED Notes (Signed)
Urinal at bedside pt is aware that urine sample is needed.

## 2020-07-09 NOTE — H&P (Signed)
History and Physical    William Acevedo IRJ:[id number] DOB: 1955/02/14 DOA: 07/09/2020  PCP: Pcp, No  Patient coming from: Home  Chief Complaint: confusion, N/V  HPI: William Acevedo is a 65 y.o. male with medical history significant of CVA, HTN, DM, cocaine abuse. Presenting with confusion. History is from his brother. He reports that the patient was in his normal state of health yesterday morning. He left the patient in his house and went to run errands. When he returned, he found his house in complete disarray. The patient seemed confused while sitting in the middle of the floor and staring off into space. He eventually rolled up into a ball on the floor and vomited. He continued to be nauseous throughout the night. His brother became concerned and brought him to the ED.  ED Course: Labwork was largely unremarkable. CTH was concerning for acute stroke. Neuro was consulted. [facility] was called for admission.   Review of Systems:  Denies chest pain, dyspnea, palpitations, diarrhea, fevers. Remainder of ROS is negative for all not mentioned in HPI.  PMHx Past Medical History:  Diagnosis Date   Carpal tunnel syndrome    Depression    Diabetes mellitus without complication ([place])    Hypertensive urgency 04/14/2020   Neuropathy     PSHx Past Surgical History:  Procedure Laterality Date   BUBBLE STUDY  03/30/2020   Procedure: BUBBLE STUDY;  Surgeon: Jerline Pain, MD;  Location: [place];  Service: Cardiovascular;;   TEE WITHOUT CARDIOVERSION N/A 03/30/2020   Procedure: TRANSESOPHAGEAL ECHOCARDIOGRAM (TEE);  Surgeon: Jerline Pain, MD;  Location: [facility] ENDOSCOPY;  Service: Cardiovascular;  Laterality: N/A;   TESTICLE TORSION REDUCTION      SocHx  reports that he has been smoking cigarettes. He has been smoking an average of 0.50 packs per day. He has never used smokeless tobacco. He reports current alcohol use. He reports current drug use. Drug: Cocaine.  Allergies  Allergen  Reactions   Trazodone     Other reaction(s): Priapism Pt reports not allergic 06/07/20   Lisinopril Swelling    Angioedema Pt reports not allergic 06/07/20   Omeprazole     Other reaction(s): ANGIOEDEMA OF LIPS, Angioedema of tongue. Pt reports not allergic 06/07/20    FamHx No family history on file.  Prior to Admission medications   Medication Sig Start Date End Date Taking? Authorizing Provider  amitriptyline (ELAVIL) 10 MG tablet Take 20 mg by mouth at bedtime.    [provider]  aspirin EC 81 MG EC tablet Take 1 tablet (81 mg total) by mouth daily. Swallow whole. 06/09/20   Little Ishikawa, MD  atorvastatin (LIPITOR) 40 MG tablet Take 1 tablet (40 mg total) by mouth at bedtime. 06/09/20   Little Ishikawa, MD  blood glucose meter kit and supplies KIT Dispense based on patient and insurance preference. Use up to four times daily as directed. (FOR ICD-9 250.00, 250.01). 09/13/19   Isla Pence, MD  clopidogrel (PLAVIX) 75 MG tablet Take 1 tablet (75 mg total) by mouth daily. 06/09/20   Little Ishikawa, MD  cyclobenzaprine (FLEXERIL) 5 MG tablet Take 1 tablet (5 mg total) by mouth 3 (three) times daily as needed for muscle spasms. Patient taking differently: Take 5 mg by mouth daily. 03/30/20   Arrien, Jimmy Picket, MD  glipiZIDE (GLUCOTROL) 10 MG tablet Take 1 tablet (10 mg total) by mouth daily before breakfast. Patient taking differently: Take 10 mg by mouth daily. 08/24/19  [name], PA-C  hydrALAZINE (APRESOLINE) 50 MG tablet Take 1 tablet (50 mg total) by mouth every 8 (eight) hours. 04/19/20 07/18/20  Kayleen Memos, DO  insulin aspart (NOVOLOG FLEXPEN) 100 UNIT/ML FlexPen Inject 5 Units into the skin 3 (three) times daily with meals. 04/05/20   Oswald Hillock, MD  insulin glargine (LANTUS) 100 UNIT/ML Solostar Pen Inject 12 Units into the skin 2 (two) times daily. 06/09/20   Little Ishikawa, MD  Insulin Pen Needle 29G X 12MM MISC Per instructions  04/08/19   [name], MD  Multiple Vitamin (MULTIVITAMIN WITH MINERALS) TABS tablet Take 1 tablet by mouth daily. 04/20/20 07/19/20  Kayleen Memos, DO  ondansetron (ZOFRAN ODT) 4 MG disintegrating tablet Take 1 tablet (4 mg total) by mouth every 8 (eight) hours as needed for nausea or vomiting. 06/09/20   Little Ishikawa, MD  pregabalin (LYRICA) 50 MG capsule Take 50 mg by mouth 2 (two) times daily. 05/12/20   [provider]  tadalafil (CIALIS) 10 MG tablet Take 20 mg by mouth daily as needed for erectile dysfunction.    [provider]    Physical Exam: Vitals:   07/09/20 1600 07/09/20 1630 07/09/20 1700 07/09/20 1730  BP: (!) 165/85 (!) 177/95 (!) 148/71 (!) 154/81  Pulse: 97 (!) 108 95 97  Resp:  16  18  Temp:      SpO2: 100% 100% 100% 100%    General: 65 y.o. male resting in bed in NAD Eyes: PERRL, normal sclera ENMT: Nares patent w/o discharge, orophaynx clear, dentition normal, ears w/o discharge/lesions/ulcers Neck: Supple, trachea midline Cardiovascular: RRR, +S1, S2, no m/g/r, equal pulses throughout Respiratory: CTABL, no w/r/r, normal WOB GI: BS+, NDNT, no masses noted, no organomegaly noted MSK: No e/c/c Skin: No rashes, bruises, ulcerations noted Neuro: A&O x 2 (name, place), no focal deficits Psyc: Odd affect, calm/cooperative  Labs on Admission: I have personally reviewed following labs and imaging studies  CBC: Recent Labs  Lab 07/09/20 1029  WBC 5.7  HGB 12.4*  HCT 38.3*  MCV 89.3  PLT 235   Basic Metabolic Panel: Recent Labs  Lab 07/09/20 1029  NA 137  K 3.6  CL 95*  CO2 28  GLUCOSE 174*  BUN 16  CREATININE 1.14  CALCIUM 9.1   GFR: CrCl cannot be calculated (Unknown ideal weight.). Liver Function Tests: No results for input(s): AST, ALT, ALKPHOS, BILITOT, PROT, ALBUMIN in the last 168 hours. No results for input(s): LIPASE, AMYLASE in the last 168 hours. No results for input(s): AMMONIA in the last 168  hours. Coagulation Profile: No results for input(s): INR, PROTIME in the last 168 hours. Cardiac Enzymes: No results for input(s): CKTOTAL, CKMB, CKMBINDEX, TROPONINI in the last 168 hours. BNP (last 3 results) No results for input(s): PROBNP in the last 8760 hours. HbA1C: No results for input(s): HGBA1C in the last 72 hours. CBG: No results for input(s): GLUCAP in the last 168 hours. Lipid Profile: No results for input(s): CHOL, HDL, LDLCALC, TRIG, CHOLHDL, LDLDIRECT in the last 72 hours. Thyroid Function Tests: No results for input(s): TSH, T4TOTAL, FREET4, T3FREE, THYROIDAB in the last 72 hours. Anemia Panel: No results for input(s): VITAMINB12, FOLATE, FERRITIN, TIBC, IRON, RETICCTPCT in the last 72 hours. Urine analysis:    Component Value Date/Time   COLORURINE STRAW (A) 06/05/2020 2259   APPEARANCEUR CLEAR 06/05/2020 2259   LABSPEC 1.011 06/05/2020 2259   PHURINE 9.0 (H) 06/05/2020 2259   GLUCOSEU >=500 (  A) 06/05/2020 2259   HGBUR NEGATIVE 06/05/2020 2259   BILIRUBINUR NEGATIVE 06/05/2020 2259   KETONESUR 5 (A) 06/05/2020 2259   PROTEINUR 100 (A) 06/05/2020 2259   NITRITE NEGATIVE 06/05/2020 2259   LEUKOCYTESUR NEGATIVE 06/05/2020 2259    Radiological Exams on Admission: CT Head Wo Contrast  Result Date: 07/09/2020 CLINICAL DATA:  Altered mental status, recent infarction EXAM: CT HEAD WITHOUT CONTRAST TECHNIQUE: Contiguous axial images were obtained from the base of the skull through the vertex without intravenous contrast. COMPARISON:  CT and MR brain, 06/06/2020 FINDINGS: Brain: No evidence of hemorrhage, hydrocephalus, extra-axial collection or mass lesion/mass effect. There is multifocal bihemispheric hypodensity, which is generally in keeping with subacute evolution of acute diffusion restricting infarction seen on prior CT and MRI. There is at least one new region of hypodensity in the anterior left frontal lobe which does not correspond to a previously seen focus of  acute diffusion restricting infarction (series 5, image 17). Vascular: No hyperdense vessel or unexpected calcification. Skull: Normal. Negative for fracture or focal lesion. Sinuses/Orbits: No acute finding. Other: None. IMPRESSION: 1. Multifocal bihemispheric hypodensity, which is generally in keeping with subacute evolution of acute diffusion restricting infarction seen on prior CT and MRI. There is at least one new region of hypodensity in the anterior left frontal lobe which does not correspond to a previously seen focus of acute diffusion restricting infarction. This constellation of findings suggests ongoing infarctions, perhaps from a central, embolic source. Consider repeat MRI to further evaluate. 2. No evidence of hemorrhage. Electronically Signed   By: Eddie Candle M.D.   On: 07/09/2020 14:37   DG Chest Port 1 View  Result Date: 07/09/2020 CLINICAL DATA:  Evaluate for pneumonia.  Failure to thrive. EXAM: PORTABLE CHEST 1 VIEW COMPARISON:  04/02/2020 FINDINGS: The heart size and mediastinal contours are within normal limits. Both lungs are clear. The visualized skeletal structures are unremarkable. IMPRESSION: No active disease. Electronically Signed   By: Nolon Nations M.D.   On: 07/09/2020 13:42    EKG: Independently reviewed. Sinus, no st elevations  Assessment/Plan Acute CVA Hx of CVA Confusion     - admit to inpt, tele [facility]     - neuro consulted     - PT/OT/SLP     - lipid panel, A1c, echo, carotid dopplers     - permissive HTN for now  N/V     - denies complaints now, will have PRN available  Hx of cocaine abuse     - check UDS, follow  DM2     - SSI, DM diet once swallow test passed, A1c, glucose checks  HTN     - permissive HTN for now  Elevated trp     - mild, trend; no st elevations on EKG, no chest pain  Normocytic anemia     - at baseline Hgb, no evidence of bleed, follow  DVT prophylaxis: SCDs  Code Status: FULL  Family Communication: w/ brother by  phone  Consults called: Neurology   Status is: Inpatient  Remains inpatient appropriate because:Inpatient level of care appropriate due to severity of illness  Dispo: The patient is from: Home              Anticipated d/c is to:  TBD              Patient currently is not medically stable to d/c.   Difficult to place patient No   Time spent coordinating admission: 70 minutes  Pierina Schuknecht A  [facility]  If 7PM-7AM, please contact night-coverage [email or web address]  07/09/2020, 5:54 PM

## 2020-07-10 ENCOUNTER — Inpatient Hospital Stay (HOSPITAL_COMMUNITY): Payer: No Typology Code available for payment source

## 2020-07-10 DIAGNOSIS — R778 Other specified abnormalities of plasma proteins: Secondary | ICD-10-CM

## 2020-07-10 DIAGNOSIS — D638 Anemia in other chronic diseases classified elsewhere: Secondary | ICD-10-CM

## 2020-07-10 DIAGNOSIS — I6389 Other cerebral infarction: Secondary | ICD-10-CM | POA: Diagnosis not present

## 2020-07-10 DIAGNOSIS — I10 Essential (primary) hypertension: Secondary | ICD-10-CM | POA: Diagnosis not present

## 2020-07-10 DIAGNOSIS — E118 Type 2 diabetes mellitus with unspecified complications: Secondary | ICD-10-CM

## 2020-07-10 DIAGNOSIS — I639 Cerebral infarction, unspecified: Secondary | ICD-10-CM | POA: Diagnosis not present

## 2020-07-10 DIAGNOSIS — E1169 Type 2 diabetes mellitus with other specified complication: Secondary | ICD-10-CM | POA: Diagnosis not present

## 2020-07-10 DIAGNOSIS — F1410 Cocaine abuse, uncomplicated: Secondary | ICD-10-CM

## 2020-07-10 LAB — HEMOGLOBIN A1C
Hgb A1c MFr Bld: 8.8 % — ABNORMAL HIGH (ref 4.8–5.6)
Mean Plasma Glucose: 205.86 mg/dL

## 2020-07-10 LAB — ECHOCARDIOGRAM COMPLETE
Area-P 1/2: 3.11 cm2
Height: 66 in
S' Lateral: 3.1 cm
Weight: 1552.04 [oz_av]

## 2020-07-10 LAB — LIPID PANEL
Cholesterol: 172 mg/dL (ref 0–200)
HDL: 58 mg/dL (ref >40)
LDL Cholesterol: 98 mg/dL (ref 0–99)
Total CHOL/HDL Ratio: 3 RATIO
Triglycerides: 82 mg/dL (ref <150)
VLDL: 16 mg/dL (ref 0–40)

## 2020-07-10 LAB — GLUCOSE, CAPILLARY
Glucose-Capillary: 207 mg/dL — ABNORMAL HIGH (ref 70–99)
Glucose-Capillary: 174 mg/dL — ABNORMAL HIGH (ref 70–99)
Glucose-Capillary: 95 mg/dL (ref 70–99)
Glucose-Capillary: 294 mg/dL — ABNORMAL HIGH (ref 70–99)

## 2020-07-10 LAB — TROPONIN I (HIGH SENSITIVITY): Troponin I (High Sensitivity): 33 ng/L — ABNORMAL HIGH

## 2020-07-10 LAB — SARS CORONAVIRUS 2 (TAT 6-24 HRS): SARS Coronavirus 2: NEGATIVE

## 2020-07-10 MED ORDER — HYDROMORPHONE HCL 1 MG/ML IJ SOLN
0.5000 mg | INTRAMUSCULAR | Status: AC | PRN
  Administered 2020-07-10 – 2020-07-11 (×3): 0.5 mg via INTRAVENOUS
  Filled 2020-07-10 (×3): qty 0.5

## 2020-07-10 MED ORDER — ASPIRIN EC 81 MG PO TBEC
81.0000 mg | DELAYED_RELEASE_TABLET | Freq: Every day | ORAL | Status: DC
  Administered 2020-07-10 – 2020-07-13 (×4): 81 mg via ORAL
  Filled 2020-07-10 (×4): qty 1

## 2020-07-10 MED ORDER — KETOROLAC TROMETHAMINE 15 MG/ML IJ SOLN
15.0000 mg | Freq: Four times a day (QID) | INTRAMUSCULAR | Status: DC | PRN
  Administered 2020-07-11 – 2020-07-12 (×3): 15 mg via INTRAVENOUS
  Filled 2020-07-10 (×4): qty 1

## 2020-07-10 MED ORDER — ATORVASTATIN CALCIUM 10 MG PO TABS
20.0000 mg | ORAL_TABLET | Freq: Every day | ORAL | Status: DC
  Administered 2020-07-10 – 2020-07-13 (×4): 20 mg via ORAL
  Filled 2020-07-10 (×4): qty 2

## 2020-07-10 MED ORDER — ONDANSETRON HCL 4 MG/2ML IJ SOLN
4.0000 mg | Freq: Four times a day (QID) | INTRAMUSCULAR | Status: DC | PRN
  Administered 2020-07-10 – 2020-07-12 (×2): 4 mg via INTRAVENOUS
  Filled 2020-07-10 (×2): qty 2

## 2020-07-10 MED ORDER — HYDROMORPHONE HCL 1 MG/ML IJ SOLN: 1.0000 mg | INTRAMUSCULAR | Status: DC | PRN

## 2020-07-10 MED ORDER — IOHEXOL 350 MG/ML SOLN
75.0000 mL | Freq: Once | INTRAVENOUS | Status: AC | PRN
  Administered 2020-07-10: 75 mL via INTRAVENOUS

## 2020-07-10 MED ORDER — CLOPIDOGREL BISULFATE 75 MG PO TABS
75.0000 mg | ORAL_TABLET | Freq: Every day | ORAL | Status: DC
  Administered 2020-07-10 – 2020-07-13 (×4): 75 mg via ORAL
  Filled 2020-07-10 (×4): qty 1

## 2020-07-10 MED ORDER — ENOXAPARIN SODIUM 30 MG/0.3ML IJ SOSY
30.0000 mg | PREFILLED_SYRINGE | INTRAMUSCULAR | Status: DC
  Administered 2020-07-10 – 2020-07-13 (×4): 30 mg via SUBCUTANEOUS
  Filled 2020-07-10 (×4): qty 0.3

## 2020-07-10 NOTE — Progress Notes (Signed)
[facility] night shift.  The nursing staff reports that the patient passed a swallow screen and is requesting a diet order.  Heart healthy/carb modified diet was ordered.  Sanda Klein, MD.

## 2020-07-10 NOTE — Progress Notes (Signed)
  Echocardiogram 2D Echocardiogram has been performed.  William Acevedo 07/10/2020, 1:48 PM

## 2020-07-10 NOTE — Progress Notes (Addendum)
STROKE TEAM PROGRESS NOTE   INTERVAL HISTORY His OT is at the bedside.  He reports being back to baseline today. We discussed lifestyle changes and stroke risks. Stroke work up was just completed in the end of May 2022. no repeat needed as etiology remains the same in setting of UDS + Cocaine.  Vitals:   07/10/20 0020 07/10/20 0400 07/10/20 0621 07/10/20 0712  BP: (!) 193/104 (!) 155/96 (!) 151/98 (!) 147/92  Pulse: 99 92 88 80  Resp: 17 18 17 18   Temp: 97.6 F (36.4 C) (!) 97.5 F (36.4 C) 97.6 F (36.4 C) 98.2 F (36.8 C)  TempSrc: Oral Oral Oral Oral  SpO2: 100% 100% 100% 100%  Weight:      Height:       CBC:  Recent Labs  Lab 07/09/20 1029  WBC 5.7  HGB 12.4*  HCT 38.3*  MCV 89.3  PLT 308   Basic Metabolic Panel:  Recent Labs  Lab 07/09/20 1029  NA 137  K 3.6  CL 95*  CO2 28  GLUCOSE 174*  BUN 16  CREATININE 1.14  CALCIUM 9.1   Lipid Panel:  Recent Labs  Lab 07/10/20 0602  CHOL 172  TRIG 82  HDL 58  CHOLHDL 3.0  VLDL 16  LDLCALC 98   HgbA1c:  Recent Labs  Lab 07/10/20 0706  HGBA1C 8.8*   Urine Drug Screen: No results for input(s): LABOPIA, COCAINSCRNUR, LABBENZ, AMPHETMU, THCU, LABBARB in the last 168 hours.  Alcohol Level No results for input(s): ETH in the last 168 hours.  IMAGING past 24 hours CT ANGIO HEAD NECK W WO CM  Result Date: 07/10/2020 CLINICAL DATA:  Initial evaluation for neuro deficit, stroke. EXAM: CT ANGIOGRAPHY HEAD AND NECK TECHNIQUE: Multidetector CT imaging of the head and neck was performed using the standard protocol during bolus administration of intravenous contrast. Multiplanar CT image reconstructions and MIPs were obtained to evaluate the vascular anatomy. Carotid stenosis measurements (when applicable) are obtained utilizing NASCET criteria, using the distal internal carotid diameter as the denominator. CONTRAST:  28mL OMNIPAQUE IOHEXOL 350 MG/ML SOLN COMPARISON:  Prior study from 03/27/2020. FINDINGS: CTA NECK  FINDINGS Aortic arch: Visualized aortic arch normal caliber with normal branch pattern. Moderate atheromatous change about the arch and origin of the great vessels without high-grade stenosis. Right carotid system: Right CCA patent from its origin to the bifurcation without stenosis. Scattered plaque about the right carotid bulb without significant stenosis. Right ICA patent distally without stenosis, dissection or occlusion. Left carotid system: Left CCA patent from its origin to the bifurcation without stenosis. Eccentric calcified plaque about the left carotid bulb/proximal left ICA without significant stenosis. Left ICA patent distally without stenosis, dissection or occlusion. Vertebral arteries: Both vertebral arteries arise from the subclavian arteries. Left vertebral artery slightly dominant. There is a moderate 50% stenosis involving the proximal left subclavian artery prior to the takeoff of the left vertebral artery (series 7, image 301). Atheromatous change at the origins of both vertebral arteries with associated moderate stenosis on the right. Vertebral arteries patent distally without stenosis, dissection or occlusion. Skeleton: No visible acute osseous finding. No discrete or worrisome osseous lesions. Other neck: No other acute soft tissue abnormality within the neck. No mass or adenopathy. Upper chest: Visualized upper chest demonstrates no acute finding. Paraseptal emphysema. Review of the MIP images confirms the above findings CTA HEAD FINDINGS Anterior circulation: Petrous segments patent bilaterally. Scattered atheromatous change within the cavernous/supraclinoid segments of both internal carotid arteries. Associated  approximate 50% stenosis at the proximal cavernous left ICA (series 7, image 118). Additional 50% stenosis about the anterior genu of the right ICA (series 7, image 111), stable from previous. A1 segments patent. Right A1 hypoplastic. Normal anterior communicating artery complex.  Anterior cerebral arteries remain widely patent to their distal aspects. No M1 stenosis or occlusion. Normal MCA bifurcations. Distal MCA branches well perfused and symmetric. Posterior circulation: Both vertebral arteries patent to the vertebrobasilar junction without stenosis. Both PICA origins patent and normal. Basilar patent to its distal aspect without stenosis. Superior cerebral arteries patent bilaterally. Both PCAs remain widely patent and well perfused or distal aspects. Venous sinuses: Grossly patent allowing for timing the contrast bolus. Anatomic variants: Hypoplastic right A1 segment. Dominant left vertebral artery. No aneurysm. Review of the MIP images confirms the above findings IMPRESSION: 1. Stable CTA of the head and neck.  No large vessel occlusion. 2. Atheromatous change about the carotid siphons with associated moderate 50% stenoses bilaterally, stable. 3. Moderate 50% stenoses involving the proximal left subclavian artery prior to the takeoff of the left vertebral artery. 4. Emphysema (ICD10-J43.9). Electronically Signed   By: Rise Mu M.D.   On: 07/10/2020 04:46   CT Head Wo Contrast  Result Date: 07/09/2020 CLINICAL DATA:  Altered mental status, recent infarction EXAM: CT HEAD WITHOUT CONTRAST TECHNIQUE: Contiguous axial images were obtained from the base of the skull through the vertex without intravenous contrast. COMPARISON:  CT and MR brain, 06/06/2020 FINDINGS: Brain: No evidence of hemorrhage, hydrocephalus, extra-axial collection or mass lesion/mass effect. There is multifocal bihemispheric hypodensity, which is generally in keeping with subacute evolution of acute diffusion restricting infarction seen on prior CT and MRI. There is at least one new region of hypodensity in the anterior left frontal lobe which does not correspond to a previously seen focus of acute diffusion restricting infarction (series 5, image 17). Vascular: No hyperdense vessel or unexpected  calcification. Skull: Normal. Negative for fracture or focal lesion. Sinuses/Orbits: No acute finding. Other: None. IMPRESSION: 1. Multifocal bihemispheric hypodensity, which is generally in keeping with subacute evolution of acute diffusion restricting infarction seen on prior CT and MRI. There is at least one new region of hypodensity in the anterior left frontal lobe which does not correspond to a previously seen focus of acute diffusion restricting infarction. This constellation of findings suggests ongoing infarctions, perhaps from a central, embolic source. Consider repeat MRI to further evaluate. 2. No evidence of hemorrhage. Electronically Signed   By: Lauralyn Primes M.D.   On: 07/09/2020 14:37   MR BRAIN WO CONTRAST  Result Date: 07/10/2020 CLINICAL DATA:  Initial evaluation for neuro deficit, stroke suspected, confusion. EXAM: MRI HEAD WITHOUT CONTRAST TECHNIQUE: Multiplanar, multiecho pulse sequences of the brain and surrounding structures were obtained without intravenous contrast. COMPARISON:  Prior CT from 07/09/2020. FINDINGS: Brain: Examination moderately to severely degraded by motion artifact. Generalized age-related cerebral atrophy. Patchy T2/FLAIR hyperintensity within the periventricular and deep white matter both cerebral hemispheres most consistent with chronic small vessel ischemic disease, moderate in nature. Few scattered remote lacunar infarcts noted about the bilateral basal ganglia and hemispheric cerebral white matter. Scattered remote bilateral cerebellar infarcts noted. There has been interval evolution of previously identified infarcts involving the right greater than left cerebral hemispheres and right cerebellum. Relatively mild residual diffusion abnormality seen about the larger areas of infarction. Few scattered foci of petechial hemorrhage without frank hemorrhagic transformation or significant mass effect. New area of infarction involving the cortical and subcortical aspect  of the left frontal lobe is seen, consistent with an acute ischemic infarct (series 20, image 84). Area measures up to approximately 2 cm in maximal diameter. No associated hemorrhage or mass effect. No other areas of acute ischemia identified. No acute intracranial hemorrhage. No mass lesion, mass effect, or midline shift. No hydrocephalus or extra-axial fluid collection. Vascular: Major intracranial vascular flow voids are maintained. Skull and upper cervical spine: Craniocervical junction grossly within normal limits. Bone marrow signal intensity grossly normal. No visible scalp soft tissue abnormality. Sinuses/Orbits: Globes and orbital soft tissues demonstrate no acute finding. Mild mucosal thickening noted within the ethmoidal air cells. Paranasal sinuses are otherwise clear. No mastoid effusion. Other: None. IMPRESSION: 1. New acute ischemic nonhemorrhagic left frontal lobe infarct as above. 2. Continued interval evolution of multifocal subacute ischemic infarcts involving the right greater than left cerebral hemispheres and right cerebellum. Scattered areas of associated petechial hemorrhage without frank hemorrhagic transformation or significant mass effect. 3. Underlying age-related cerebral atrophy with moderate chronic small vessel ischemic disease. Electronically Signed   By: Rise Mu M.D.   On: 07/10/2020 04:09   DG Chest Port 1 View  Result Date: 07/09/2020 CLINICAL DATA:  Evaluate for pneumonia.  Failure to thrive. EXAM: PORTABLE CHEST 1 VIEW COMPARISON:  04/02/2020 FINDINGS: The heart size and mediastinal contours are within normal limits. Both lungs are clear. The visualized skeletal structures are unremarkable. IMPRESSION: No active disease. Electronically Signed   By: Norva Pavlov M.D.   On: 07/09/2020 13:42    PHYSICAL EXAM General: Frail middle-aged African-American male; no distress. Psych: Affect appropriate to situation Eyes: No scleral injection HENT: No OP  obstrucion Head: Normocephalic.  Cardiovascular: Normal rate and regular rhythm.  Respiratory: Effort normal and breath sounds normal to anterior ascultation GI: Soft.  No distension. There is no tenderness.  Skin: WDI    Neurological Examination Mental Status: Alert, oriented, but lacks insight.  Speech is mostly fluent, some paraphasic errors and mild aphasia. Able to follow 3 step commands without difficulty. Cranial Nerves: II: Visual fields grossly normal,  III,IV, VI: ptosis not present, extra-ocular movements intact bilaterally, pupils equal, round, reactive to light and accommodation V,VII: smile symmetric, facial light touch sensation normal bilaterally VIII: hearing normal bilaterally IX,X: uvula rises symmetrically XI: bilateral shoulder shrug XII: midline tongue extension Motor: Tone and bulk:normal tone throughout; no atrophy noted. He has mild drift in R arm, slower on right than left with commands.  Diminished fine finger movements on the right compared to the left.  Orbits left over right upper extremity. Sensory:  light touch intact throughout, bilaterally Deep Tendon Reflexes: 2+ and symmetric throughout Plantars: Right: downgoing   Left: downgoing Cerebellar: normal finger-to-nose, normal rapid alternating movements and normal heel-to-shin test Gait: normal gait and station   ASSESSMENT/PLAN Mr. William Acevedo is a 65 y.o. male presenting with recurrent strokes in the setting of daily cocaine use.  I have advised him to quit using cocaine, and he assures me that he will not use any more.  With multifocal embolic disease, as well as abdominal pain, I think that mesenteric ischemia may need to be considered if no other etiology for his abdominal pain is found, would consider this as a possibility..   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 Code Stroke CT head shows new hypodensity in left. Small vessel disease.  Atrophy.  CTA head & neck No LVO. Atheromatous change about the carotid siphons with associated moderate  50% stenoses bilaterally, stable. Moderate 50% stenoses involving the proximal left subclavian artery prior to the takeoff of the left vertebral artery. MRI  New acute ischemic nonhemorrhagic left frontal lobe infarct; Continued interval evolution of multifocal subacute ischemic infarcts involving the right greater than left cerebral hemispheres and right cerebellum. Scattered areas of associated petechial hemorrhage without frank hemorrhagic transformation or significant mass effect. 2D Echo Done 5/21, no need to repeat: 65% EF LDL 98 HgbA1c 8.8 VTE prophylaxis - lovenox    Diet   Diet heart healthy/carb modified Room service appropriate? Yes; Fluid consistency: Thin   aspirin 81 mg daily and clopidogrel 75 mg daily prior to admission, now on aspirin 81 mg daily and clopidogrel 75 mg daily.  Therapy recommendations:  pending Disposition:  pending  Hypertension Home meds:  Hydralazine Stable Permissive hypertension (OK if < 220/120) but gradually normalize in 5-7 days Long-term BP goal normotensive  Hyperlipidemia Home meds:  Lipitor 40mg , resumed in hospital LDL 98, goal < 70 High intensity statin  Continue statin at discharge  Diabetes type II Uncontrolled Home meds:  Glucotrol, Novolog, Lantus HgbA1c 8.8, goal < 7.0 CBGs Recent Labs    07/10/20 0727  GLUCAP 207*    SSI  Other Stroke Risk Factors Advanced Age >/= 8165  Cigarette smoker; advised to stop smoking Substance abuse - UDS:  THC NONE DETECTED, Cocaine POSITIVE. Patient advised to stop using due to stroke risk. Hx stroke/TIA Family hx stroke  Coronary artery disease  Other Active Problems Non-compliant with medications  Hospital day # 1  Desiree Metzger-Cihelka, ARNP-C, ANVP-BC Pager: [phone]  I have personally obtained history,examined this patient, reviewed notes, independently viewed imaging  studies, participated in medical decision making and plan of care.ROS completed by me personally and pertinent positives fully documented  I have made any additions or clarifications directly to the above note. Agree with note above.  Patient presented with recurrent left MCA infarct likely due to vasculopathy from cocaine abuse.  Patient counseled to quit cocaine and make lifestyle changes and maintain aggressive risk factor modification.  Continue aspirin and Plavix for 3 weeks followed by aspirin alone.  Discussed with Dr. Brown HumanSpongeberg Greater than 50% time during this 35-minute visit was spent in counseling and coordination of care about his stroke and cocaine abuse and discussion of stroke prevention and treatment and answering questions. Delia HeadyPramod Lindaann Gradilla, MD Medical Director [facility] Pager: [phone] 07/10/2020 3:14 PM  To contact Stroke Continuity provider, please refer to [email or web address]. After hours, contact General Neurology

## 2020-07-10 NOTE — Evaluation (Signed)
Physical Therapy Evaluation Patient Details Name: William Acevedo MRN: [id number] DOB: 1955-09-18 Today's Date: 07/10/2020   History of Present Illness  65 yo male [facility] on 6/23 with confusion, nausea/vomiting. CTH shows evolution of the strokes known about from May as well as a new left frontal subcortical hypodensity most consistent with stroke. PMH includes: HTN, HLD, CVA, uncontrolled DM II, renal infarct, major depressive disorder, polysubstance abuse (cocaine and marijuana), multiple recent admissions for abdominal pain, shingles, and multifocal acute infarcts.  Clinical Impression   Pt presents with generalized weakness but no focal weakness L vs R appreciated, expressive difficulties, back pain which pt attributes to chronic "shingles" (care team aware), impaired gait, increased time and effort to mobilize, and decreased activity tolerance. Pt to benefit from acute PT to address deficits. Pt ambulated hallway distance without AD, mildly ataxic but per pt he feels close to baseline. PT anticipates no follow up needs post-acutely, will have support of brother at home.  PT to progress mobility as tolerated, and will continue to follow acutely.      Follow Up Recommendations No PT follow up;Supervision - Intermittent    Equipment Recommendations  None recommended by PT    Recommendations for Other Services       Precautions / Restrictions Precautions Precautions: Fall Precaution Comments: possible shingles? nursing aware - per pt, RN and MD does not think it is shingles Restrictions Weight Bearing Restrictions: No      Mobility  Bed Mobility Overal bed mobility: Modified Independent             General bed mobility comments: Mod I for increased time and bedrail use    Transfers Overall transfer level: Needs assistance Equipment used: None Transfers: Sit to/from Stand Sit to Stand: Supervision         General transfer comment: for safety, no physical  assist provided  Ambulation/Gait Ambulation/Gait assistance: Min guard;Supervision Gait Distance (Feet): 360 Feet Assistive device: None Gait Pattern/deviations: Step-through pattern;Decreased stride length;Drifts right/left Gait velocity: decr   General Gait Details: initially min guard for safety, transitioning to supervision with further distance walked. Pt drifting R>L, pt with significant lumbar lordosis during gait  Stairs            Wheelchair Mobility    Modified Rankin (Stroke Patients Only) Modified Rankin (Stroke Patients Only) Pre-Morbid Rankin Score: No significant disability Modified Rankin: Slight disability     Balance Overall balance assessment: Needs assistance Sitting-balance support: No upper extremity supported;Feet supported Sitting balance-Leahy Scale: Good     Standing balance support: No upper extremity supported;During functional activity Standing balance-Leahy Scale: Good Standing balance comment: tolerates horizontal/vertical head turns, step over object, directional changes without LOB                             Pertinent Vitals/Pain Pain Assessment: Faces Faces Pain Scale: Hurts even more Pain Location: back, pt states due to "shingles" Pain Descriptors / Indicators: Discomfort;Grimacing Pain Intervention(s): Limited activity within patient's tolerance;Monitored during session;Repositioned    Home Living Family/patient expects to be discharged to:: Private residence Living Arrangements: Other relatives (brother) Available Help at Discharge: Family Type of Home: House Home Access: Ramped entrance     Home Layout: One level Home Equipment: Bedside commode;Shower seat;Cane - single point;Crutches;Wheelchair - manual      Prior Function Level of Independence: Independent               Hand  Dominance   Dominant Hand: Right    Extremity/Trunk Assessment   Upper Extremity Assessment Upper Extremity  Assessment: Defer to OT evaluation    Lower Extremity Assessment Lower Extremity Assessment: Generalized weakness (no focal weakness appreciated on MMT screen of hip flexors, knee ext/flexors, hip abd/adductors) RLE Coordination: decreased gross motor LLE Coordination: decreased gross motor    Cervical / Trunk Assessment Cervical / Trunk Assessment: Normal  Communication   Communication: Expressive difficulties  Cognition Arousal/Alertness: Awake/alert Behavior During Therapy: Flat affect Overall Cognitive Status: Impaired/Different from baseline Area of Impairment: Following commands;Safety/judgement;Problem solving                       Following Commands: Follows one step commands with increased time Safety/Judgement: Decreased awareness of deficits;Decreased awareness of safety   Problem Solving: Slow processing;Requires verbal cues;Requires tactile cues General Comments: expressive difficulties, pt easily frustrated by this during session. Pt requiring increased processing time to follow commands.      General Comments      Exercises     Assessment/Plan    PT Assessment Patient needs continued PT services  PT Problem List Decreased strength;Decreased activity tolerance;Decreased balance;Decreased mobility;Decreased coordination;Decreased safety awareness;Decreased cognition       PT Treatment Interventions Gait training;Functional mobility training;Therapeutic activities;Therapeutic exercise;Balance training;Patient/family education    PT Goals (Current goals can be found in the Care Plan section)  Acute Rehab PT Goals Patient Stated Goal: none stated PT Goal Formulation: With patient Time For Goal Achievement: 07/24/20 Potential to Achieve Goals: Good    Frequency Min 4X/week   Barriers to discharge        Co-evaluation               AM-PAC PT "6 Clicks" Mobility  Outcome Measure Help needed turning from your back to your side while in a  flat bed without using bedrails?: None Help needed moving from lying on your back to sitting on the side of a flat bed without using bedrails?: None Help needed moving to and from a bed to a chair (including a wheelchair)?: None Help needed standing up from a chair using your arms (e.g., wheelchair or bedside chair)?: None Help needed to walk in hospital room?: A Little Help needed climbing 3-5 steps with a railing? : A Little 6 Click Score: 22    End of Session   Activity Tolerance: Patient tolerated treatment well Patient left: Other (comment);with call bell/phone within reach (up with OT, dovetail) Nurse Communication: Mobility status PT Visit Diagnosis: Unsteadiness on feet (R26.81);Difficulty in walking, not elsewhere classified (R26.2)    Time: 3382-5053 PT Time Calculation (min) (ACUTE ONLY): 14 min   Charges:   PT Evaluation $PT Eval Low Complexity: 1 Low         Astaria Nanez S, PT DPT Acute Rehabilitation Services Pager [phone]  Office [phone]   Tyrone Apple E Christain [place] 07/10/2020, 10:39 AM

## 2020-07-10 NOTE — Progress Notes (Addendum)
RN has been attempting to get a urine sample since 7am. During shift report in room, pt informed we needed a sample. Night shift RN stated pt urinated with urinal but pt stated he missed the urinal and that's why he didn't get a sample.  Around 10am, pt was "caught" in bathroom as bed alarm was going off. RN asked it patient had urinated and pt stated he was just getting "cleaned up" and denied using the restroom. RN educated about fall precautions and waiting for staff if he wanted to get cleaned up.   After lunch time, bed alarming again. NT entered room and asked pt if he has urinated, pt denied. RN informed. RN to room and again instructed pt that we needed a urine sample from him. Pt stated he hasn't had to go.   1700 Pt sitting up in recliner chair. RN asked pt how many times he's urinated today. Pt denies urinating at all. RN bladder scanned pt, 52ml reported. Pt has been drinking coffee, juice, soda, and water all day. Pt stated, "I don't know why they even need it". RN educated pt that we needed a urine sample to fully assess his medical situation. Fall precautions in place, [facility].

## 2020-07-10 NOTE — Evaluation (Signed)
Occupational Therapy Evaluation Patient Details Name: William Acevedo MRN: [id number] DOB: 16-Aug-1955 Today's Date: 07/10/2020    History of Present Illness 64 yo male presents to [facility] on 6/23 with confusion, nausea/vomiting. CTH shows evolution of the strokes known about from May as well as a new left frontal subcortical hypodensity most consistent with stroke. PMH includes: HTN, HLD, CVA, uncontrolled DM II, renal infarct, major depressive disorder, polysubstance abuse (cocaine and marijuana), multiple recent admissions for abdominal pain, shingles, and multifocal acute infarcts.   Clinical Impression   PTA, pt was living his brother and was performing BADLs; reports his brother performs IADLs. Pt currently performing ADLs and functional mobility at Supervision level for safety due to decreased cognition. Pt presenting with poor attention, ST memory, problem solving, awareness, and executive functioning as seen during ADLs and formal testing. Administered the Short Bless Test to assess cognition. Pt scorring 17/28 indicating "impairment consistent with dementia". Pt would benefit from further acute OT to facilitate safe dc. Recommend dc to home with follow up at OP OT to address pt's cognition and optimize safety, independence with ADLs, and return to PLOF.     Follow Up Recommendations  Outpatient OT    Equipment Recommendations  None recommended by OT    Recommendations for Other Services       Precautions / Restrictions Precautions Precautions: Fall Precaution Comments: possible shingles? nursing aware - per pt, RN and MD does not think it is shingles Restrictions Weight Bearing Restrictions: No      Mobility Bed Mobility Overal bed mobility: Modified Independent             General bed mobility comments: Mod I for increased time and bedrail use    Transfers Overall transfer level: Needs assistance Equipment used: None Transfers: Sit to/from Stand Sit to Stand:  Supervision         General transfer comment: for safety, no physical assist provided    Balance Overall balance assessment: Needs assistance Sitting-balance support: No upper extremity supported;Feet supported Sitting balance-Leahy Scale: Good     Standing balance support: No upper extremity supported;During functional activity Standing balance-Leahy Scale: Good Standing balance comment: tolerates horizontal/vertical head turns, step over object, directional changes without LOB                           ADL either performed or assessed with clinical judgement   ADL Overall ADL's : Needs assistance/impaired Eating/Feeding: Supervision/ safety;Set up;Sitting   Grooming: Set up;Supervision/safety;Standing;Oral care Grooming Details (indicate cue type and reason): Supervision for safety Upper Body Bathing: Set up;Sitting   Lower Body Bathing: Supervison/ safety;Set up;Sit to/from stand   Upper Body Dressing : Supervision/safety;Set up;Sitting   Lower Body Dressing: Supervision/safety;Set up;Sit to/from stand Lower Body Dressing Details (indicate cue type and reason): Supervision for safety Toilet Transfer: Supervision/safety;Ambulation;Regular Toilet           Functional mobility during ADLs: Supervision/safety General ADL Comments: Supervision for safety during ADLs and functional mobility. Presenting with poor cognition and speech     Vision Baseline Vision/History: Wears glasses Wears Glasses: At all times Patient Visual Report: Other (comment) ("dont know hwta happen to them") Additional Comments: Denies changes     Perception     Praxis      Pertinent Vitals/Pain Pain Assessment: Faces Faces Pain Scale: Hurts even more Pain Location: back, pt states due to "shingles" Pain Descriptors / Indicators: Discomfort;Grimacing Pain Intervention(s): Monitored during session;Limited activity within patient's  tolerance;Repositioned     Hand Dominance  Right   Extremity/Trunk Assessment Upper Extremity Assessment Upper Extremity Assessment: LUE deficits/detail LUE Deficits / Details: Reports pain. Presenting with decreased strength 4+/5.   Lower Extremity Assessment Lower Extremity Assessment: Defer to PT evaluation RLE Coordination: decreased gross motor LLE Coordination: decreased gross motor   Cervical / Trunk Assessment Cervical / Trunk Assessment: Normal   Communication Communication Communication: Expressive difficulties   Cognition Arousal/Alertness: Awake/alert Behavior During Therapy: Flat affect Overall Cognitive Status: Impaired/Different from baseline Area of Impairment: Following commands;Safety/judgement;Problem solving                       Following Commands: Follows one step commands with increased time Safety/Judgement: Decreased awareness of deficits;Decreased awareness of safety   Problem Solving: Slow processing;Requires verbal cues;Requires tactile cues General Comments: expressive difficulties, pt easily frustrated by this during session. Pt requiring increased processing time to follow commands. When asking pt to place bedpad in laundry bad; and pt threw bedpad in trash. Providing questioning cues to if this was the correct location; Max cues to oprient to laundry bag. Administered Short Blessed Test. Pt scoring 17/28 demonstrating "impairment consistent with dementia". Difficulty with attention, memory, and problem solving. During counting backward task, pt skipping 10 and once at 9 stating "I am tired" and declining to try further. During naming months of year backwards, pt stating "its getting hard." Providing cues to initate with December but pt stating "its getting hard".   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 Reports that his brother has been performing IADLs including money management and medication management.        OT Problem List: Decreased strength;Decreased range of motion;Impaired balance (sitting and/or standing);Decreased activity tolerance;Decreased coordination;Decreased safety awareness;Decreased knowledge of use of DME or AE;Decreased knowledge of precautions      OT Treatment/Interventions: Therapeutic exercise;Neuromuscular education;Self-care/ADL training;DME and/or AE instruction;Therapeutic activities;Balance training    OT Goals(Current goals can be found in the care plan section) Acute Rehab OT Goals Patient Stated Goal: none stated OT Goal Formulation: With patient Time For Goal Achievement: 06/20/20 Potential to Achieve Goals: Good  OT Frequency: Min 2X/week   Barriers to D/C:            Co-evaluation              AM-PAC OT "6 Clicks" Daily Activity     Outcome Measure Help from another person eating meals?: None Help from another person taking care of personal grooming?: A Little Help from another person toileting, which includes using toliet, bedpan, or urinal?: A Little Help from another person bathing (including washing, rinsing, drying)?: A Little Help from another person to put on and taking off regular upper body clothing?: A Little Help from another person to put on and taking off regular lower body clothing?: A Little 6 Click Score:  19   End of Session Nurse Communication: Mobility status  Activity Tolerance: Patient tolerated treatment well Patient  left: in bed;with call bell/phone within reach;with bed alarm set  OT Visit Diagnosis: Unsteadiness on feet (R26.81);Ataxia, unspecified (R27.0)                Time: 9326-7124 OT Time Calculation (min): 20 min Charges:  OT General Charges $OT Visit: 1 Visit OT Evaluation $OT Eval Moderate Complexity: 1 Mod  William Acevedo MSOT, OTR/L Acute Rehab Pager: [phone] Office: [phone]  William Acevedo 07/10/2020, 10:58 AM

## 2020-07-10 NOTE — TOC Initial Note (Signed)
Transition of Care [facility]) - Initial/Assessment Note    Patient Details  Name: William Acevedo MRN: [id number] Date of Birth: [birth year]  [facility] CM/SW Contact:    Baldemar Lenis, LCSW Phone Number: 07/10/2020, 4:23 PM  Clinical Narrative:       CSW received message to contact patient's brother. CSW spoke with patient's brother, Fayrene Fearing, who indicated that he is no longer able to care for his brother and he needs help with getting him placed. Fayrene Fearing discussed how he has been trying to work with the Child psychotherapist through the [place] on placement, and provided name and number of the patient's Child psychotherapist. Fayrene Fearing also discussed concerns about the patient's medical issues, and CSW indicated that Fayrene Fearing would have to discuss those concerns with the MD. CSW discussed with Fayrene Fearing how the patient is doing too well for SNF placement, but we could look at ALF for LTC. CSW completed referral for LTC through the [place] and sent referral information over for screening. CSW left a voicemail for the patient's social worker, [place], who called back at the end of the day that the patient is not service connected, so he will not qualify for placement through the [place] and will need placement under his Medicaid. CSW to look for Medicaid ALF for patient.   Patient's PCP is Dr. Izola Price. Social worker is [place], [phone].             Expected Discharge Plan: Assisted Living Barriers to Discharge: Continued Medical Work up, Unsafe home situation, Active Substance Use - Placement, Insurance Authorization   Patient Goals and CMS Choice   CMS Medicare.gov Compare Post Acute Care list provided to:: Patient Represenative (must comment) Choice offered to / list presented to : Sibling  Expected Discharge Plan and Services Expected Discharge Plan: Assisted Living     Post Acute Care Choice: Nursing Home Living arrangements for the past 2 months: Single Family Home                                       Prior Living Arrangements/Services Living arrangements for the past 2 months: Single Family Home Lives with:: Siblings Patient language and need for interpreter reviewed:: No Do you feel safe going back to the place where you live?: Yes      Need for Family Participation in Patient Care: Yes (Comment) Care giver support system in place?: No (comment)   Criminal Activity/Legal Involvement Pertinent to Current Situation/Hospitalization: No - Comment as needed  Activities of Daily Living Home Assistive Devices/Equipment: None ADL Screening (condition at time of admission) Patient's cognitive ability adequate to safely complete daily activities?: Yes Is the patient deaf or have difficulty hearing?: No Does the patient have difficulty seeing, even when wearing glasses/contacts?: No Does the patient have difficulty concentrating, remembering, or making decisions?: Yes Patient able to express need for assistance with ADLs?: Yes Does the patient have difficulty dressing or bathing?: No Independently performs ADLs?: Yes (appropriate for developmental age) Does the patient have difficulty walking or climbing stairs?: No Weakness of Legs: None Weakness of Arms/Hands: None  Permission Sought/Granted Permission sought to share information with : Facility Medical sales representative, Family Supports Permission granted to share information with : Yes, Verbal Permission Granted  Share Information with NAME: Fayrene Fearing  Permission granted to share info w [facility]: [facility], [facility]  Permission granted to share info w Relationship: Brother  Emotional Assessment         Alcohol / Substance Use: Illicit Drugs    Admission diagnosis:  CVA (cerebral vascular accident) (HCC) [I63.9] Failure to thrive in adult [R62.7] Polysubstance abuse (HCC) [F19.10] Cerebrovascular accident (CVA) due to embolism of precerebral artery (HCC) [I63.10] Patient Active Problem List   Diagnosis Date Noted   CVA  (cerebral vascular accident) (HCC) 07/09/2020   Acute ischemic multifocal multiple vascular territories stroke (HCC) 06/06/2020   Seizure-like activity (HCC) 06/06/2020   Cocaine abuse (HCC)    ARF (acute renal failure) (HCC) 04/14/2020   Acute blood loss anemia 04/14/2020   GI bleed 04/14/2020   Nausea & vomiting 04/14/2020   Acute metabolic encephalopathy 04/14/2020   Hypertensive urgency 04/14/2020   Abdominal pain 04/02/2020   Protein-calorie malnutrition, severe 03/27/2020   Cerebral thrombosis with cerebral infarction 03/26/2020   NSTEMI (non-ST elevated myocardial infarction) (HCC) 03/25/2020   Secondary diabetes mellitus with HHNC (hyperglycemia hyperosmolar non-ketotic coma) (HCC) 03/25/2020   Elevated troponin    Renal infarction (HCC)    Cerebrovascular accident (CVA) (HCC)    Cocaine use disorder, severe, dependence (HCC) 04/08/2019   Major depressive disorder 04/08/2019   Epiglottitis 04/07/2019   Cocaine use 04/07/2019   Acute encephalopathy 04/07/2019   Overdose 07/06/2015   AKI (acute kidney injury) (HCC) 07/06/2015   Polysubstance abuse (HCC) 07/06/2015   DM2 (diabetes mellitus, type 2) (HCC) 07/06/2015   Dehydration    MDD (major depressive disorder), recurrent episode, mild (HCC)    Uncomplicated alcohol dependence (HCC)    PCP:  Pcp, No Pharmacy:   [facility] [id number] [place], [place] - [address] [place] [address] Phone: [phone] Fax: [phone]  [facility] - [address], [place] - [address] [place] [address] Phone: [phone] Fax: [phone]     Social Determinants of Health (SDOH) Interventions    Readmission Risk Interventions Readmission Risk Prevention Plan 04/19/2020  Transportation Screening Complete  Medication Review (RN Care Manager) Complete  PCP or Specialist appointment within 3-5 days of discharge Complete  HRI or Home Care Consult Complete  SW  Recovery Care/Counseling Consult Complete  Palliative Care Screening Not Applicable  Skilled Nursing Facility Not Applicable  Some recent data might be hidden

## 2020-07-10 NOTE — Progress Notes (Signed)
[facility] night shift.  The nursing staff reported that the patient is having excruciating abdominal pain rated at 10/10.  He has a history of chronic abdominal pain.  He takes dicyclomine and oral hydromorphone at home.  Hydromorphone 0.5 mg IVP every 2 hours x 3 doses and ondansetron 4 mg IVP every 6 hours as needed was ordered.  Sanda Klein, MD

## 2020-07-10 NOTE — Progress Notes (Signed)
PROGRESS NOTE    William BouillonCharlie C Acevedo  WUJ:[id number] DOB: Aug 18, 1955 DOA: 07/09/2020 PCP: Pcp, No   Brief Narrative:  William BouillonCharlie C Acevedo is a 65 y.o. male with medical history significant of CVA, HTN, DM, cocaine abuse. Presenting with confusion. History is from his brother. He reports that the patient was in his normal state of health yesterday morning. He left the patient in his house and went to run errands. When he returned, he found his house in complete disarray. The patient seemed confused while sitting in the middle of the floor and staring off into space. He eventually rolled up into a ball on the floor and vomited. He continued to be nauseous throughout the night. His brother became concerned and brought him to the ED   Assessment & Plan:   Active Problems:   CVA (cerebral vascular accident) (HCC)   Acute CVA Hx of CVA Confusion     - [facility] to [facility]     - c/w ASA PLAVIX     - will add statin     - neuro consulted     - PT/OT/SLP pending-pt requests SNF/STR     - lipid panel LDL 98,      - A1c 8.8     - echo, carotid dopplers both pending     - permissive HTN for now   N/V     - denies complaints now, will have PRN available   Hx of cocaine abuse     - PENDING UDS,    DM2     - SSI, DM diet. glucose checks   HTN     - permissive HTN for now   Elevated trp     - mild, trend; no st elevations on EKG, no chest pain   Normocytic anemia     - at baseline Hgb, no evidence of bleed, follow    DVT prophylaxis: SCD/Compression stockings lovenox Code Status: FULL    Code Status Orders  (From admission, onward)           Start     Ordered   07/09/20 2102  Full code  Continuous        07/09/20 2101           Code Status History     Date Active Date Inactive Code Status Order ID Comments User Context   06/06/2020 0201 06/09/2020 2256 Full Code [id number]  Briscoe Deutscherpyd, Timothy S, MD ED   04/14/2020 1530 04/19/2020 2037 Full Code [id number]  Clydie BraunSmith,  Rondell A, MD ED   04/02/2020 2218 04/05/2020 2051 Full Code [id number]  Margie EgeKyle, Tyrone A, DO ED   03/25/2020 1759 03/30/2020 2208 Full Code [id number]  Lanae BoastKc, Ramesh, MD ED   04/07/2019 0401 04/08/2019 2231 Full Code [id number]  Joselyn ArrowFair, Chelsea N, MD ED   08/26/2016 1421 08/27/2016 1618 Full Code [id number]  Marily MemosMesner, Jason, MD ED   08/26/2016 1159 08/26/2016 1421 Full Code [id number]  Wojeck, Hinton Dyerobyn K, NP ED   07/06/2015 0025 07/06/2015 1653 Full Code [id number]  Hillary BowGardner, Jared M, DO ED   09/04/2014 2047 09/05/2014 2011 Full Code [id number]  Earley FavorSchulz, Gail, NP ED      Family Communication: discussed with pt in detail today Disposition Plan: tbc, anticipate SNF for psot cva deficits and rehab  Consults called: None Admission status: Inpatient. Pt still with confusion, no safe d/c plan   Consultants:  neuro  Procedures:  CT ANGIO HEAD NECK W WO CM  Result Date: 07/10/2020 CLINICAL DATA:  Initial evaluation for neuro deficit, stroke. EXAM: CT ANGIOGRAPHY HEAD AND NECK TECHNIQUE: Multidetector CT imaging of the head and neck was performed using the standard protocol during bolus administration of intravenous contrast. Multiplanar CT image reconstructions and MIPs were obtained to evaluate the vascular anatomy. Carotid stenosis measurements (when applicable) are obtained utilizing NASCET criteria, using the distal internal carotid diameter as the denominator. CONTRAST:  58mL OMNIPAQUE IOHEXOL 350 MG/ML SOLN COMPARISON:  Prior study from 03/27/2020. FINDINGS: CTA NECK FINDINGS Aortic arch: Visualized aortic arch normal caliber with normal branch pattern. Moderate atheromatous change about the arch and origin of the great vessels without high-grade stenosis. Right carotid system: Right CCA patent from its origin to the bifurcation without stenosis. Scattered plaque about the right carotid bulb without significant stenosis. Right ICA patent distally without stenosis, dissection or occlusion. Left carotid system: Left CCA patent  from its origin to the bifurcation without stenosis. Eccentric calcified plaque about the left carotid bulb/proximal left ICA without significant stenosis. Left ICA patent distally without stenosis, dissection or occlusion. Vertebral arteries: Both vertebral arteries arise from the subclavian arteries. Left vertebral artery slightly dominant. There is a moderate 50% stenosis involving the proximal left subclavian artery prior to the takeoff of the left vertebral artery (series 7, image 301). Atheromatous change at the origins of both vertebral arteries with associated moderate stenosis on the right. Vertebral arteries patent distally without stenosis, dissection or occlusion. Skeleton: No visible acute osseous finding. No discrete or worrisome osseous lesions. Other neck: No other acute soft tissue abnormality within the neck. No mass or adenopathy. Upper chest: Visualized upper chest demonstrates no acute finding. Paraseptal emphysema. Review of the MIP images confirms the above findings CTA HEAD FINDINGS Anterior circulation: Petrous segments patent bilaterally. Scattered atheromatous change within the cavernous/supraclinoid segments of both internal carotid arteries. Associated approximate 50% stenosis at the proximal cavernous left ICA (series 7, image 118). Additional 50% stenosis about the anterior genu of the right ICA (series 7, image 111), stable from previous. A1 segments patent. Right A1 hypoplastic. Normal anterior communicating artery complex. Anterior cerebral arteries remain widely patent to their distal aspects. No M1 stenosis or occlusion. Normal MCA bifurcations. Distal MCA branches well perfused and symmetric. Posterior circulation: Both vertebral arteries patent to the vertebrobasilar junction without stenosis. Both PICA origins patent and normal. Basilar patent to its distal aspect without stenosis. Superior cerebral arteries patent bilaterally. Both PCAs remain widely patent and well perfused  or distal aspects. Venous sinuses: Grossly patent allowing for timing the contrast bolus. Anatomic variants: Hypoplastic right A1 segment. Dominant left vertebral artery. No aneurysm. Review of the MIP images confirms the above findings IMPRESSION: 1. Stable CTA of the head and neck.  No large vessel occlusion. 2. Atheromatous change about the carotid siphons with associated moderate 50% stenoses bilaterally, stable. 3. Moderate 50% stenoses involving the proximal left subclavian artery prior to the takeoff of the left vertebral artery. 4. Emphysema (ICD10-J43.9). Electronically Signed   By: Rise Mu M.D.   On: 07/10/2020 04:46   CT Head Wo Contrast  Result Date: 07/09/2020 CLINICAL DATA:  Altered mental status, recent infarction EXAM: CT HEAD WITHOUT CONTRAST TECHNIQUE: Contiguous axial images were obtained from the base of the skull through the vertex without intravenous contrast. COMPARISON:  CT and MR brain, 06/06/2020 FINDINGS: Brain: No evidence of hemorrhage, hydrocephalus, extra-axial collection or mass lesion/mass effect. There is multifocal bihemispheric hypodensity, which is generally in keeping with subacute evolution of acute diffusion restricting infarction seen on prior  CT and MRI. There is at least one new region of hypodensity in the anterior left frontal lobe which does not correspond to a previously seen focus of acute diffusion restricting infarction (series 5, image 17). Vascular: No hyperdense vessel or unexpected calcification. Skull: Normal. Negative for fracture or focal lesion. Sinuses/Orbits: No acute finding. Other: None. IMPRESSION: 1. Multifocal bihemispheric hypodensity, which is generally in keeping with subacute evolution of acute diffusion restricting infarction seen on prior CT and MRI. There is at least one new region of hypodensity in the anterior left frontal lobe which does not correspond to a previously seen focus of acute diffusion restricting infarction. This  constellation of findings suggests ongoing infarctions, perhaps from a central, embolic source. Consider repeat MRI to further evaluate. 2. No evidence of hemorrhage. Electronically Signed   By: Lauralyn Primes M.D.   On: 07/09/2020 14:37   MR BRAIN WO CONTRAST  Result Date: 07/10/2020 CLINICAL DATA:  Initial evaluation for neuro deficit, stroke suspected, confusion. EXAM: MRI HEAD WITHOUT CONTRAST TECHNIQUE: Multiplanar, multiecho pulse sequences of the brain and surrounding structures were obtained without intravenous contrast. COMPARISON:  Prior CT from 07/09/2020. FINDINGS: Brain: Examination moderately to severely degraded by motion artifact. Generalized age-related cerebral atrophy. Patchy T2/FLAIR hyperintensity within the periventricular and deep white matter both cerebral hemispheres most consistent with chronic small vessel ischemic disease, moderate in nature. Few scattered remote lacunar infarcts noted about the bilateral basal ganglia and hemispheric cerebral white matter. Scattered remote bilateral cerebellar infarcts noted. There has been interval evolution of previously identified infarcts involving the right greater than left cerebral hemispheres and right cerebellum. Relatively mild residual diffusion abnormality seen about the larger areas of infarction. Few scattered foci of petechial hemorrhage without frank hemorrhagic transformation or significant mass effect. New area of infarction involving the cortical and subcortical aspect of the left frontal lobe is seen, consistent with an acute ischemic infarct (series 20, image 84). Area measures up to approximately 2 cm in maximal diameter. No associated hemorrhage or mass effect. No other areas of acute ischemia identified. No acute intracranial hemorrhage. No mass lesion, mass effect, or midline shift. No hydrocephalus or extra-axial fluid collection. Vascular: Major intracranial vascular flow voids are maintained. Skull and upper cervical spine:  Craniocervical junction grossly within normal limits. Bone marrow signal intensity grossly normal. No visible scalp soft tissue abnormality. Sinuses/Orbits: Globes and orbital soft tissues demonstrate no acute finding. Mild mucosal thickening noted within the ethmoidal air cells. Paranasal sinuses are otherwise clear. No mastoid effusion. Other: None. IMPRESSION: 1. New acute ischemic nonhemorrhagic left frontal lobe infarct as above. 2. Continued interval evolution of multifocal subacute ischemic infarcts involving the right greater than left cerebral hemispheres and right cerebellum. Scattered areas of associated petechial hemorrhage without frank hemorrhagic transformation or significant mass effect. 3. Underlying age-related cerebral atrophy with moderate chronic small vessel ischemic disease. Electronically Signed   By: Rise Mu M.D.   On: 07/10/2020 04:09   DG Chest Port 1 View  Result Date: 07/09/2020 CLINICAL DATA:  Evaluate for pneumonia.  Failure to thrive. EXAM: PORTABLE CHEST 1 VIEW COMPARISON:  04/02/2020 FINDINGS: The heart size and mediastinal contours are within normal limits. Both lungs are clear. The visualized skeletal structures are unremarkable. IMPRESSION: No active disease. Electronically Signed   By: Norva Pavlov M.D.   On: 07/09/2020 13:42       Subjective: Pt reports weakness, still with confusion  Objective: Vitals:   07/10/20 0020 07/10/20 0400 07/10/20 0621 07/10/20 0712  BP: [name])  193/104 (!) 155/96 (!) 151/98 (!) 147/92  Pulse: 99 92 88 80  Resp: Temp: 97.6 F (36.4 C) (!) 97.5 F (36.4 C) 97.6 F (36.4 C) 98.2 F (36.8 C)  TempSrc: Oral Oral Oral Oral  SpO2: 100% 100% 100% 100%  Weight:      Height:        Intake/Output Summary (Last 24 hours) at 07/10/2020 1035 Last data filed at 07/10/2020 0900 Gross per 24 hour  Intake 360 ml  Output --  Net 360 ml   Filed Weights   07/09/20 1844  Weight: 44 kg     Examination:  General exam: Appears agitated, mild confusion Respiratory system: Clear to auscultation. Respiratory effort normal. Cardiovascular system: S1 & S2 heard, RRR. No JVD, murmurs, rubs, gallops or clicks. No pedal edema. Gastrointestinal system: Abdomen is nondistended, soft and nontender. No organomegaly or masses felt. Normal bowel sounds heard. Central nervous system: Alert name and place, No focal neurological deficits. Extremities: wwp, no edema Skin: No rashes, lesions or ulcers Psychiatry: Judgement and insight appear normal. Mood & affect appropriate.     Data Reviewed: I have personally reviewed following labs and imaging studies  CBC: Recent Labs  Lab 07/09/20 1029  WBC 5.7  HGB 12.4*  HCT 38.3*  MCV 89.3  PLT 308   Basic Metabolic Panel: Recent Labs  Lab 07/09/20 1029  NA 137  K 3.6  CL 95*  CO2 28  GLUCOSE 174*  BUN 16  CREATININE 1.14  CALCIUM 9.1   GFR: Estimated Creatinine Clearance: 39.7 mL/min (by C-G formula based on SCr of 1.14 mg/dL). Liver Function Tests: No results for input(s): AST, ALT, ALKPHOS, BILITOT, PROT, ALBUMIN in the last 168 hours. No results for input(s): LIPASE, AMYLASE in the last 168 hours. No results for input(s): AMMONIA in the last 168 hours. Coagulation Profile: No results for input(s): INR, PROTIME in the last 168 hours. Cardiac Enzymes: No results for input(s): CKTOTAL, CKMB, CKMBINDEX, TROPONINI in the last 168 hours. BNP (last 3 results) No results for input(s): PROBNP in the last 8760 hours. HbA1C: Recent Labs    07/10/20 0706  HGBA1C 8.8*   CBG: Recent Labs  Lab 07/10/20 0727  GLUCAP 207*   Lipid Profile: Recent Labs    07/10/20 0602  CHOL 172  HDL 58  LDLCALC 98  TRIG 82  CHOLHDL 3.0   Thyroid Function Tests: No results for input(s): TSH, T4TOTAL, FREET4, T3FREE, THYROIDAB in the last 72 hours. Anemia Panel: No results for input(s): VITAMINB12, FOLATE, FERRITIN, TIBC, IRON,  RETICCTPCT in the last 72 hours. Sepsis Labs: No results for input(s): PROCALCITON, LATICACIDVEN in the last 168 hours.  Recent Results (from the past 240 hour(s))  SARS CORONAVIRUS 2 (TAT 6-24 HRS) Nasopharyngeal Nasopharyngeal Swab     Status: None   Collection Time: 07/09/20  4:35 PM   Specimen: Nasopharyngeal Swab  Result Value Ref Range Status   SARS Coronavirus 2 NEGATIVE NEGATIVE Final    Comment: (NOTE) SARS-CoV-2 target nucleic acids are NOT DETECTED.  The SARS-CoV-2 RNA is generally detectable in upper and lower respiratory specimens during the acute phase of infection. Negative results do not preclude SARS-CoV-2 infection, do not rule out co-infections with other pathogens, and should not be used as the sole basis for treatment or other patient management decisions. Negative results must be combined with clinical observations, patient history, and epidemiological information. The expected result is Negative.  Fact Sheet for Patients: HairSlick.no  Fact  Sheet for Healthcare Providers: quierodirigir.com  This test is not yet approved or cleared by the [place] FDA and  has been authorized for detection and/or diagnosis of SARS-CoV-2 by FDA under an Emergency Use Authorization (EUA). This EUA will remain  in effect (meaning this test can be used) for the duration of the COVID-19 declaration under Se ction 564(b)(1) of the Act, 21 U.S.C. section 360bbb-3(b)(1), unless the authorization is terminated or revoked sooner.  Performed at [facility], [address] [place], [place] [address]          Radiology Studies: CT ANGIO HEAD NECK W WO CM  Result Date: 07/10/2020 CLINICAL DATA:  Initial evaluation for neuro deficit, stroke. EXAM: CT ANGIOGRAPHY HEAD AND NECK TECHNIQUE: Multidetector CT imaging of the head and neck was performed using the standard protocol during bolus administration of  intravenous contrast. Multiplanar CT image reconstructions and MIPs were obtained to evaluate the vascular anatomy. Carotid stenosis measurements (when applicable) are obtained utilizing NASCET criteria, using the distal internal carotid diameter as the denominator. CONTRAST:  75mL OMNIPAQUE IOHEXOL 350 MG/ML SOLN COMPARISON:  Prior study from 03/27/2020. FINDINGS: CTA NECK FINDINGS Aortic arch: Visualized aortic arch normal caliber with normal branch pattern. Moderate atheromatous change about the arch and origin of the great vessels without high-grade stenosis. Right carotid system: Right CCA patent from its origin to the bifurcation without stenosis. Scattered plaque about the right carotid bulb without significant stenosis. Right ICA patent distally without stenosis, dissection or occlusion. Left carotid system: Left CCA patent from its origin to the bifurcation without stenosis. Eccentric calcified plaque about the left carotid bulb/proximal left ICA without significant stenosis. Left ICA patent distally without stenosis, dissection or occlusion. Vertebral arteries: Both vertebral arteries arise from the subclavian arteries. Left vertebral artery slightly dominant. There is a moderate 50% stenosis involving the proximal left subclavian artery prior to the takeoff of the left vertebral artery (series 7, image 301). Atheromatous change at the origins of both vertebral arteries with associated moderate stenosis on the right. Vertebral arteries patent distally without stenosis, dissection or occlusion. Skeleton: No visible acute osseous finding. No discrete or worrisome osseous lesions. Other neck: No other acute soft tissue abnormality within the neck. No mass or adenopathy. Upper chest: Visualized upper chest demonstrates no acute finding. Paraseptal emphysema. Review of the MIP images confirms the above findings CTA HEAD FINDINGS Anterior circulation: Petrous segments patent bilaterally. Scattered atheromatous  change within the cavernous/supraclinoid segments of both internal carotid arteries. Associated approximate 50% stenosis at the proximal cavernous left ICA (series 7, image 118). Additional 50% stenosis about the anterior genu of the right ICA (series 7, image 111), stable from previous. A1 segments patent. Right A1 hypoplastic. Normal anterior communicating artery complex. Anterior cerebral arteries remain widely patent to their distal aspects. No M1 stenosis or occlusion. Normal MCA bifurcations. Distal MCA branches well perfused and symmetric. Posterior circulation: Both vertebral arteries patent to the vertebrobasilar junction without stenosis. Both PICA origins patent and normal. Basilar patent to its distal aspect without stenosis. Superior cerebral arteries patent bilaterally. Both PCAs remain widely patent and well perfused or distal aspects. Venous sinuses: Grossly patent allowing for timing the contrast bolus. Anatomic variants: Hypoplastic right A1 segment. Dominant left vertebral artery. No aneurysm. Review of the MIP images confirms the above findings IMPRESSION: 1. Stable CTA of the head and neck.  No large vessel occlusion. 2. Atheromatous change about the carotid siphons with associated moderate 50% stenoses bilaterally, stable. 3. Moderate  50% stenoses involving the proximal left subclavian artery prior to the takeoff of the left vertebral artery. 4. Emphysema (ICD10-J43.9). Electronically Signed   By: Rise Mu M.D.   On: 07/10/2020 04:46   CT Head Wo Contrast  Result Date: 07/09/2020 CLINICAL DATA:  Altered mental status, recent infarction EXAM: CT HEAD WITHOUT CONTRAST TECHNIQUE: Contiguous axial images were obtained from the base of the skull through the vertex without intravenous contrast. COMPARISON:  CT and MR brain, 06/06/2020 FINDINGS: Brain: No evidence of hemorrhage, hydrocephalus, extra-axial collection or mass lesion/mass effect. There is multifocal bihemispheric  hypodensity, which is generally in keeping with subacute evolution of acute diffusion restricting infarction seen on prior CT and MRI. There is at least one new region of hypodensity in the anterior left frontal lobe which does not correspond to a previously seen focus of acute diffusion restricting infarction (series 5, image 17). Vascular: No hyperdense vessel or unexpected calcification. Skull: Normal. Negative for fracture or focal lesion. Sinuses/Orbits: No acute finding. Other: None. IMPRESSION: 1. Multifocal bihemispheric hypodensity, which is generally in keeping with subacute evolution of acute diffusion restricting infarction seen on prior CT and MRI. There is at least one new region of hypodensity in the anterior left frontal lobe which does not correspond to a previously seen focus of acute diffusion restricting infarction. This constellation of findings suggests ongoing infarctions, perhaps from a central, embolic source. Consider repeat MRI to further evaluate. 2. No evidence of hemorrhage. Electronically Signed   By: Lauralyn Primes M.D.   On: 07/09/2020 14:37   MR BRAIN WO CONTRAST  Result Date: 07/10/2020 CLINICAL DATA:  Initial evaluation for neuro deficit, stroke suspected, confusion. EXAM: MRI HEAD WITHOUT CONTRAST TECHNIQUE: Multiplanar, multiecho pulse sequences of the brain and surrounding structures were obtained without intravenous contrast. COMPARISON:  Prior CT from 07/09/2020. FINDINGS: Brain: Examination moderately to severely degraded by motion artifact. Generalized age-related cerebral atrophy. Patchy T2/FLAIR hyperintensity within the periventricular and deep white matter both cerebral hemispheres most consistent with chronic small vessel ischemic disease, moderate in nature. Few scattered remote lacunar infarcts noted about the bilateral basal ganglia and hemispheric cerebral white matter. Scattered remote bilateral cerebellar infarcts noted. There has been interval evolution of  previously identified infarcts involving the right greater than left cerebral hemispheres and right cerebellum. Relatively mild residual diffusion abnormality seen about the larger areas of infarction. Few scattered foci of petechial hemorrhage without frank hemorrhagic transformation or significant mass effect. New area of infarction involving the cortical and subcortical aspect of the left frontal lobe is seen, consistent with an acute ischemic infarct (series 20, image 84). Area measures up to approximately 2 cm in maximal diameter. No associated hemorrhage or mass effect. No other areas of acute ischemia identified. No acute intracranial hemorrhage. No mass lesion, mass effect, or midline shift. No hydrocephalus or extra-axial fluid collection. Vascular: Major intracranial vascular flow voids are maintained. Skull and upper cervical spine: Craniocervical junction grossly within normal limits. Bone marrow signal intensity grossly normal. No visible scalp soft tissue abnormality. Sinuses/Orbits: Globes and orbital soft tissues demonstrate no acute finding. Mild mucosal thickening noted within the ethmoidal air cells. Paranasal sinuses are otherwise clear. No mastoid effusion. Other: None. IMPRESSION: 1. New acute ischemic nonhemorrhagic left frontal lobe infarct as above. 2. Continued interval evolution of multifocal subacute ischemic infarcts involving the right greater than left cerebral hemispheres and right cerebellum. Scattered areas of associated petechial hemorrhage without frank hemorrhagic transformation or significant mass effect. 3. Underlying age-related cerebral atrophy with  moderate chronic small vessel ischemic disease. Electronically Signed   By: Rise Mu M.D.   On: 07/10/2020 04:09   DG Chest Port 1 View  Result Date: 07/09/2020 CLINICAL DATA:  Evaluate for pneumonia.  Failure to thrive. EXAM: PORTABLE CHEST 1 VIEW COMPARISON:  04/02/2020 FINDINGS: The heart size and mediastinal  contours are within normal limits. Both lungs are clear. The visualized skeletal structures are unremarkable. IMPRESSION: No active disease. Electronically Signed   By: Norva Pavlov M.D.   On: 07/09/2020 13:42        Scheduled Meds:  aspirin EC  81 mg Oral Daily   clopidogrel  75 mg Oral Daily   enoxaparin (LOVENOX) injection  30 mg Subcutaneous Q24H   insulin aspart  0-9 Units Subcutaneous TID WC   Continuous Infusions:   LOS: 1 day    Time spent: 35 min    Burke Keels, MD [facility]  If 7PM-7AM, please contact night-coverage  07/10/2020, 10:35 AM

## 2020-07-11 ENCOUNTER — Inpatient Hospital Stay (HOSPITAL_COMMUNITY): Payer: No Typology Code available for payment source

## 2020-07-11 DIAGNOSIS — R1084 Generalized abdominal pain: Secondary | ICD-10-CM

## 2020-07-11 DIAGNOSIS — I10 Essential (primary) hypertension: Secondary | ICD-10-CM | POA: Diagnosis not present

## 2020-07-11 DIAGNOSIS — R778 Other specified abnormalities of plasma proteins: Secondary | ICD-10-CM | POA: Diagnosis not present

## 2020-07-11 DIAGNOSIS — E118 Type 2 diabetes mellitus with unspecified complications: Secondary | ICD-10-CM | POA: Diagnosis not present

## 2020-07-11 DIAGNOSIS — E1169 Type 2 diabetes mellitus with other specified complication: Secondary | ICD-10-CM | POA: Diagnosis not present

## 2020-07-11 LAB — RAPID URINE DRUG SCREEN, HOSP PERFORMED
Amphetamines: NOT DETECTED
Barbiturates: NOT DETECTED
Benzodiazepines: NOT DETECTED
Cocaine: POSITIVE — AB
Opiates: NOT DETECTED
Tetrahydrocannabinol: NOT DETECTED

## 2020-07-11 LAB — URINALYSIS, ROUTINE W REFLEX MICROSCOPIC
Bacteria, UA: NONE SEEN
Bilirubin Urine: NEGATIVE
Glucose, UA: >=500 mg/dL — AB
Hgb urine dipstick: NEGATIVE
Ketones, ur: 5 mg/dL — AB
Leukocytes,Ua: NEGATIVE
Nitrite: NEGATIVE
Protein, ur: 30 mg/dL — AB
Specific Gravity, Urine: 1.012 (ref 1.005–1.030)
pH: 7 (ref 5.0–8.0)

## 2020-07-11 LAB — LACTIC ACID, PLASMA
Lactic Acid, Venous: 3.2 mmol/L (ref 0.5–1.9)
Lactic Acid, Venous: 1.9 mmol/L (ref 0.5–1.9)

## 2020-07-11 LAB — CBC WITH DIFFERENTIAL/PLATELET
Abs Immature Granulocytes: 0 10*3/uL (ref 0.00–0.07)
Basophils Absolute: 0 10*3/uL (ref 0.0–0.1)
Basophils Relative: 0 %
Eosinophils Absolute: 0.1 10*3/uL (ref 0.0–0.5)
Eosinophils Relative: 2 %
HCT: 35.1 % — ABNORMAL LOW (ref 39.0–52.0)
Hemoglobin: 11.6 g/dL — ABNORMAL LOW (ref 13.0–17.0)
Immature Granulocytes: 0 %
Lymphocytes Relative: 37 %
Lymphs Abs: 2.1 10*3/uL (ref 0.7–4.0)
MCH: 28.6 pg (ref 26.0–34.0)
MCHC: 33 g/dL (ref 30.0–36.0)
MCV: 86.5 fL (ref 80.0–100.0)
Monocytes Absolute: 0.6 10*3/uL (ref 0.1–1.0)
Monocytes Relative: 10 %
Neutro Abs: 2.9 10*3/uL (ref 1.7–7.7)
Neutrophils Relative %: 51 %
Platelets: 282 10*3/uL (ref 150–400)
RBC: 4.06 MIL/uL — ABNORMAL LOW (ref 4.22–5.81)
RDW: 12.6 % (ref 11.5–15.5)
WBC: 5.6 10*3/uL (ref 4.0–10.5)
nRBC: 0 % (ref 0.0–0.2)

## 2020-07-11 LAB — GLUCOSE, CAPILLARY
Glucose-Capillary: 254 mg/dL — ABNORMAL HIGH (ref 70–99)
Glucose-Capillary: 110 mg/dL — ABNORMAL HIGH (ref 70–99)
Glucose-Capillary: 132 mg/dL — ABNORMAL HIGH (ref 70–99)
Glucose-Capillary: 161 mg/dL — ABNORMAL HIGH (ref 70–99)

## 2020-07-11 LAB — BASIC METABOLIC PANEL
Anion gap: 12 (ref 5–15)
BUN: 13 mg/dL (ref 8–23)
CO2: 29 mmol/L (ref 22–32)
Calcium: 9.2 mg/dL (ref 8.9–10.3)
Chloride: 93 mmol/L — ABNORMAL LOW (ref 98–111)
Creatinine, Ser: 1.16 mg/dL (ref 0.61–1.24)
GFR, Estimated: >60 mL/min (ref >60)
Glucose, Bld: 107 mg/dL — ABNORMAL HIGH (ref 70–99)
Potassium: 3.2 mmol/L — ABNORMAL LOW (ref 3.5–5.1)
Sodium: 134 mmol/L — ABNORMAL LOW (ref 135–145)

## 2020-07-11 MED ORDER — IOHEXOL 300 MG/ML  SOLN
100.0000 mL | Freq: Once | INTRAMUSCULAR | Status: AC | PRN
  Administered 2020-07-11: 100 mL via INTRAVENOUS

## 2020-07-11 MED ORDER — HYDROCODONE-ACETAMINOPHEN 10-325 MG PO TABS
1.0000 | ORAL_TABLET | Freq: Four times a day (QID) | ORAL | Status: DC | PRN
  Administered 2020-07-11 – 2020-07-13 (×5): 1 via ORAL
  Filled 2020-07-11 (×6): qty 1

## 2020-07-11 MED ORDER — SODIUM CHLORIDE 0.9 % IV SOLN: INTRAVENOUS | Status: AC

## 2020-07-11 MED ORDER — HYDRALAZINE HCL 20 MG/ML IJ SOLN
10.0000 mg | Freq: Four times a day (QID) | INTRAMUSCULAR | Status: DC | PRN
  Filled 2020-07-11: qty 1

## 2020-07-11 MED ORDER — IOHEXOL 9 MG/ML PO SOLN
ORAL | Status: AC
  Administered 2020-07-11: 500 mL
  Filled 2020-07-11: qty 1000

## 2020-07-11 MED ORDER — AMLODIPINE BESYLATE 10 MG PO TABS
10.0000 mg | ORAL_TABLET | Freq: Every day | ORAL | Status: DC
  Administered 2020-07-11 – 2020-07-13 (×3): 10 mg via ORAL
  Filled 2020-07-11 (×3): qty 1

## 2020-07-11 NOTE — Progress Notes (Signed)
PROGRESS NOTE    William Acevedo  ZOX:[id number] DOB: 1955/03/31 DOA: 07/09/2020 PCP: Pcp, No   Brief Narrative:  William Acevedo is a 65 y.o. male with medical history significant of CVA, HTN, DM, cocaine abuse. Presenting with confusion. History is from his brother. He reports that the patient was in his normal state of health yesterday morning. He left the patient in his house and went to run errands. When he returned, he found his house in complete disarray. The patient seemed confused while sitting in the middle of the floor and staring off into space. He eventually rolled up into a ball on the floor and vomited. He continued to be nauseous throughout the night. His brother became concerned and brought him to the ED  Patient with abdominal pain overnight, received Dilaudid x3 doses Patient was resting comfortably in bed when I saw him this morning without complaint Ordered CBC, BMP, lactic acid, CT of abdomen pelvis   Assessment & Plan:   Active Problems:   CVA (cerebral vascular accident) (HCC)   Acute CVA Hx of CVA Confusion     - admitted to inpt, tele at [facility]     - c/w ASA PLAVIX for 3 weeks then aspirin alone     -continue statin     - neuro consulted     - PT/OT/SLP pending-pt requests SNF/STR     - lipid panel LDL 98,      - A1c 8.8     - echo, carotid dopplers both pending     -start to titrate patient's blood pressure medications back in     -added as needed hydralazine   N/V and abdominal pain  - denies nausea and vomiting complaints now, will have PRN available -Because of abdominal pain check lactic acid mildly elevated, -Added normal saline for hydration as patient is thin cachectic and appears somewhat dehydrated -Follow-up CT abdomen pelvis with contrast -White blood cell count normal and no fevers -Benign abdominal exam currently   Hx of cocaine abuse     - PENDING UDS,-patient still not providing urine sample   DM2     - SSI, DM diet. glucose  checks   HTN -titrating medications back in -Amlodipine 10 mg daily  Elevated trp - mild, trend; no st elevations on EKG, no chest pain -As above   Normocytic anemia     - at baseline Hgb, no evidence of bleed, follow      DVT prophylaxis: SCD/Compression stockings  Code Status: full    Code Status Orders  (From admission, onward)           Start     Ordered   07/09/20 2102  Full code  Continuous        07/09/20 2101           Code Status History     Date Active Date Inactive Code Status Order ID Comments User Context   06/06/2020 0201 06/09/2020 2256 Full Code [id number]  Briscoe Deutscher, MD ED   04/14/2020 1530 04/19/2020 2037 Full Code [id number]  Clydie Braun, MD ED   04/02/2020 2218 04/05/2020 2051 Full Code [id number]  Margie Ege A, DO ED   03/25/2020 1759 03/30/2020 2208 Full Code [id number]  Lanae Boast, MD ED   04/07/2019 0401 04/08/2019 2231 Full Code [id number]  Joselyn Arrow, MD ED   08/26/2016 1421 08/27/2016 1618 Full Code [id number]  Marily Memos, MD ED   08/26/2016 1159 08/26/2016 1421 Full Code [id number]  Albesa Seen, NP ED   07/06/2015 0025 07/06/2015 1653 Full Code [id number]  Hillary Bow, DO ED   09/04/2014 2047 09/05/2014 2011 Full Code [id number]  Earley Favor, NP ED      Family Communication: tried calling brother, NA LM Disposition Plan:   Family is unable to take care of patient, social work involved for disposition Consults called:  NEURO Admission status: Inpatient   Consultants:  AS ABOVE  Procedures:  CT ANGIO HEAD NECK W WO CM  Result Date: 07/10/2020 CLINICAL DATA:  Initial evaluation for neuro deficit, stroke. EXAM: CT ANGIOGRAPHY HEAD AND NECK TECHNIQUE: Multidetector CT imaging of the head and neck was performed using the standard protocol during bolus administration of intravenous contrast. Multiplanar CT image reconstructions and MIPs were obtained to evaluate the vascular anatomy. Carotid stenosis measurements (when applicable)  are obtained utilizing NASCET criteria, using the distal internal carotid diameter as the denominator. CONTRAST:  75mL OMNIPAQUE IOHEXOL 350 MG/ML SOLN COMPARISON:  Prior study from 03/27/2020. FINDINGS: CTA NECK FINDINGS Aortic arch: Visualized aortic arch normal caliber with normal branch pattern. Moderate atheromatous change about the arch and origin of the great vessels without high-grade stenosis. Right carotid system: Right CCA patent from its origin to the bifurcation without stenosis. Scattered plaque about the right carotid bulb without significant stenosis. Right ICA patent distally without stenosis, dissection or occlusion. Left carotid system: Left CCA patent from its origin to the bifurcation without stenosis. Eccentric calcified plaque about the left carotid bulb/proximal left ICA without significant stenosis. Left ICA patent distally without stenosis, dissection or occlusion. Vertebral arteries: Both vertebral arteries arise from the subclavian arteries. Left vertebral artery slightly dominant. There is a moderate 50% stenosis involving the proximal left subclavian artery prior to the takeoff of the left vertebral artery (series 7, image 301). Atheromatous change at the origins of both vertebral arteries with associated moderate stenosis on the right. Vertebral arteries patent distally without stenosis, dissection or occlusion. Skeleton: No visible acute osseous finding. No discrete or worrisome osseous lesions. Other neck: No other acute soft tissue abnormality within the neck. No mass or adenopathy. Upper chest: Visualized upper chest demonstrates no acute finding. Paraseptal emphysema. Review of the MIP images confirms the above findings CTA HEAD FINDINGS Anterior circulation: Petrous segments patent bilaterally. Scattered atheromatous change within the cavernous/supraclinoid segments of both internal carotid arteries. Associated approximate 50% stenosis at the proximal cavernous left ICA (series  7, image 118). Additional 50% stenosis about the anterior genu of the right ICA (series 7, image 111), stable from previous. A1 segments patent. Right A1 hypoplastic. Normal anterior communicating artery complex. Anterior cerebral arteries remain widely patent to their distal aspects. No M1 stenosis or occlusion. Normal MCA bifurcations. Distal MCA branches well perfused and symmetric. Posterior circulation: Both vertebral arteries patent to the vertebrobasilar junction without stenosis. Both PICA origins patent and normal. Basilar patent to its distal aspect without stenosis. Superior cerebral arteries patent bilaterally. Both PCAs remain widely patent and well perfused or distal aspects. Venous sinuses: Grossly patent allowing for timing the contrast bolus. Anatomic variants: Hypoplastic right A1 segment. Dominant left vertebral artery. No aneurysm. Review of the MIP images confirms the above findings IMPRESSION: 1. Stable CTA of the head and neck.  No large vessel occlusion. 2. Atheromatous change about the carotid siphons with associated moderate 50% stenoses bilaterally, stable. 3. Moderate 50% stenoses involving the proximal left subclavian artery prior to the takeoff of the left vertebral artery. 4. Emphysema (ICD10-J43.9). Electronically Signed  By: Rise Mu M.D.   On: 07/10/2020 04:46   CT Head Wo Contrast  Result Date: 07/09/2020 CLINICAL DATA:  Altered mental status, recent infarction EXAM: CT HEAD WITHOUT CONTRAST TECHNIQUE: Contiguous axial images were obtained from the base of the skull through the vertex without intravenous contrast. COMPARISON:  CT and MR brain, 06/06/2020 FINDINGS: Brain: No evidence of hemorrhage, hydrocephalus, extra-axial collection or mass lesion/mass effect. There is multifocal bihemispheric hypodensity, which is generally in keeping with subacute evolution of acute diffusion restricting infarction seen on prior CT and MRI. There is at least one new region of  hypodensity in the anterior left frontal lobe which does not correspond to a previously seen focus of acute diffusion restricting infarction (series 5, image 17). Vascular: No hyperdense vessel or unexpected calcification. Skull: Normal. Negative for fracture or focal lesion. Sinuses/Orbits: No acute finding. Other: None. IMPRESSION: 1. Multifocal bihemispheric hypodensity, which is generally in keeping with subacute evolution of acute diffusion restricting infarction seen on prior CT and MRI. There is at least one new region of hypodensity in the anterior left frontal lobe which does not correspond to a previously seen focus of acute diffusion restricting infarction. This constellation of findings suggests ongoing infarctions, perhaps from a central, embolic source. Consider repeat MRI to further evaluate. 2. No evidence of hemorrhage. Electronically Signed   By: Lauralyn Primes M.D.   On: 07/09/2020 14:37   MR BRAIN WO CONTRAST  Result Date: 07/10/2020 CLINICAL DATA:  Initial evaluation for neuro deficit, stroke suspected, confusion. EXAM: MRI HEAD WITHOUT CONTRAST TECHNIQUE: Multiplanar, multiecho pulse sequences of the brain and surrounding structures were obtained without intravenous contrast. COMPARISON:  Prior CT from 07/09/2020. FINDINGS: Brain: Examination moderately to severely degraded by motion artifact. Generalized age-related cerebral atrophy. Patchy T2/FLAIR hyperintensity within the periventricular and deep white matter both cerebral hemispheres most consistent with chronic small vessel ischemic disease, moderate in nature. Few scattered remote lacunar infarcts noted about the bilateral basal ganglia and hemispheric cerebral white matter. Scattered remote bilateral cerebellar infarcts noted. There has been interval evolution of previously identified infarcts involving the right greater than left cerebral hemispheres and right cerebellum. Relatively mild residual diffusion abnormality seen about the  larger areas of infarction. Few scattered foci of petechial hemorrhage without frank hemorrhagic transformation or significant mass effect. New area of infarction involving the cortical and subcortical aspect of the left frontal lobe is seen, consistent with an acute ischemic infarct (series 20, image 84). Area measures up to approximately 2 cm in maximal diameter. No associated hemorrhage or mass effect. No other areas of acute ischemia identified. No acute intracranial hemorrhage. No mass lesion, mass effect, or midline shift. No hydrocephalus or extra-axial fluid collection. Vascular: Major intracranial vascular flow voids are maintained. Skull and upper cervical spine: Craniocervical junction grossly within normal limits. Bone marrow signal intensity grossly normal. No visible scalp soft tissue abnormality. Sinuses/Orbits: Globes and orbital soft tissues demonstrate no acute finding. Mild mucosal thickening noted within the ethmoidal air cells. Paranasal sinuses are otherwise clear. No mastoid effusion. Other: None. IMPRESSION: 1. New acute ischemic nonhemorrhagic left frontal lobe infarct as above. 2. Continued interval evolution of multifocal subacute ischemic infarcts involving the right greater than left cerebral hemispheres and right cerebellum. Scattered areas of associated petechial hemorrhage without frank hemorrhagic transformation or significant mass effect. 3. Underlying age-related cerebral atrophy with moderate chronic small vessel ischemic disease. Electronically Signed   By: Rise Mu M.D.   On: 07/10/2020 04:09   DG Chest  Port 1 View  Result Date: 07/09/2020 CLINICAL DATA:  Evaluate for pneumonia.  Failure to thrive. EXAM: PORTABLE CHEST 1 VIEW COMPARISON:  04/02/2020 FINDINGS: The heart size and mediastinal contours are within normal limits. Both lungs are clear. The visualized skeletal structures are unremarkable. IMPRESSION: No active disease. Electronically Signed   By:  Norva Pavlov M.D.   On: 07/09/2020 13:42   ECHOCARDIOGRAM COMPLETE  Result Date: 07/10/2020    ECHOCARDIOGRAM REPORT   Patient Name:   HELIOS KOHLMANN Date of Exam: 07/10/2020 Medical Rec #:  [id number]           Height:       66.0 in Accession #:    [id number]          Weight:       97.0 lb Date of Birth:  [birth year]           BSA:          1.470 m Patient Age:    65 years            BP:           146/79 mmHg Patient Gender: M                   HR:           86 bpm. Exam Location:  Inpatient Procedure: 2D Echo, Cardiac Doppler and Color Doppler Indications:    Stroke I63.9  History:        Patient has prior history of Echocardiogram examinations, most                 recent 06/06/2020. Risk Factors:Current Smoker, Diabetes and                 Hypertension. Polysubstance abuse.  Sonographer:    Leta Jungling RDCS Referring Phys: [id number] Teddy Spike IMPRESSIONS  1. Left ventricular ejection fraction, by estimation, is 55 to 60%. The left ventricle has normal function. The left ventricle has no regional wall motion abnormalities. There is mild left ventricular hypertrophy of the basal-septal segment. Left ventricular diastolic parameters are consistent with Grade I diastolic dysfunction (impaired relaxation).  2. Right ventricular systolic function is normal. The right ventricular size is normal.  3. The mitral valve is normal in structure. No evidence of mitral valve regurgitation. No evidence of mitral stenosis. Moderate mitral annular calcification.  4. The aortic valve is tricuspid. Aortic valve regurgitation is trivial. No aortic stenosis is present.  5. The inferior vena cava is normal in size with greater than 50% respiratory variability, suggesting right atrial pressure of 3 mmHg. FINDINGS  Left Ventricle: Left ventricular ejection fraction, by estimation, is 55 to 60%. The left ventricle has normal function. The left ventricle has no regional wall motion abnormalities. The left ventricular  internal cavity size was normal in size. There is  mild left ventricular hypertrophy of the basal-septal segment. Left ventricular diastolic parameters are consistent with Grade I diastolic dysfunction (impaired relaxation). Right Ventricle: The right ventricular size is normal.Right ventricular systolic function is normal. Left Atrium: Left atrial size was normal in size. Right Atrium: Right atrial size was normal in size. Pericardium: There is no evidence of pericardial effusion. Mitral Valve: The mitral valve is normal in structure. Moderate mitral annular calcification. No evidence of mitral valve regurgitation. No evidence of mitral valve stenosis. Tricuspid Valve: The tricuspid valve is normal in structure. Tricuspid valve regurgitation is trivial. No evidence of tricuspid stenosis. Aortic Valve:  The aortic valve is tricuspid. Aortic valve regurgitation is trivial. No aortic stenosis is present. Pulmonic Valve: The pulmonic valve was normal in structure. Pulmonic valve regurgitation is not visualized. No evidence of pulmonic stenosis. Aorta: The aortic root is normal in size and structure. Venous: The inferior vena cava is normal in size with greater than 50% respiratory variability, suggesting right atrial pressure of 3 mmHg. IAS/Shunts: No atrial level shunt detected by color flow Doppler.  LEFT VENTRICLE PLAX 2D LVIDd:         4.10 cm  Diastology LVIDs:         3.10 cm  LV e' medial:    3.87 cm/s LV PW:         0.90 cm  LV E/e' medial:  13.7 LV IVS:        0.70 cm  LV e' lateral:   8.51 cm/s LVOT diam:     1.60 cm  LV E/e' lateral: 6.2 LV SV:         23 LV SV Index:   16 LVOT Area:     2.01 cm  RIGHT VENTRICLE RV S prime:     11.50 cm/s TAPSE (M-mode): 1.8 cm LEFT ATRIUM             Index       RIGHT ATRIUM          Index LA diam:        2.30 cm 1.56 cm/m  RA Area:     5.26 cm LA Vol (A2C):   14.6 ml 9.93 ml/m  RA Volume:   7.85 ml  5.34 ml/m LA Vol (A4C):   14.6 ml 9.93 ml/m LA Biplane Vol: 14.9 ml  10.13 ml/m  AORTIC VALVE LVOT Vmax:   68.00 cm/s LVOT Vmean:  49.600 cm/s LVOT VTI:    0.115 m  AORTA Ao Root diam: 2.60 cm MITRAL VALVE MV Area (PHT): 3.11 cm    SHUNTS MV Decel Time: 244 msec    Systemic VTI:  0.12 m MV E velocity: 53.00 cm/s  Systemic Diam: 1.60 cm MV A velocity: 93.90 cm/s MV E/A ratio:  0.56 Olga MillersBrian Crenshaw MD Electronically signed by Olga MillersBrian Crenshaw MD Signature Date/Time: 07/10/2020/1:56:09 PM    Final        Subjective: Reported abd pain overnight, received Dilaudid, , Resting comfortably in bed right now  Objective: Vitals:   07/10/20 2003 07/11/20 0049 07/11/20 0334 07/11/20 1117  BP: 139/86 (!) 178/90 (!) 171/109 127/84  Pulse: 94 92 (!) 108 83  Resp: 17 17 14 16   Temp: 97.8 F (36.6 C) 97.8 F (36.6 C) 97.6 F (36.4 C) 98.2 F (36.8 C)  TempSrc: Oral Oral Oral Oral  SpO2: 100% 100% 100% 100%  Weight:      Height:       No intake or output data in the 24 hours ending 07/11/20 1202 Filed Weights   07/09/20 1844  Weight: 44 kg    Examination:  General exam: Appears calm and comfortable, no evidence of abdominal pain Respiratory system: Clear to auscultation. Respiratory effort normal. Cardiovascular system: S1 & S2 heard, RRR. No JVD, murmurs, rubs, gallops or clicks. No pedal edema. Gastrointestinal system: Abdomen is nondistended, soft and nontender. No organomegaly or masses felt. Normal bowel sounds heard.  No acute findings not rigid Central nervous system: Alert and oriented. No focal neurological deficits. Extremities: Thin, but warm well perfused Skin: No rashes, lesions or ulcers Psychiatry: Judgement and insight appear normal. Mood &  affect appropriate.     Data Reviewed: I have personally reviewed following labs and imaging studies  CBC: Recent Labs  Lab 07/09/20 1029 07/11/20 1018  WBC 5.7 5.6  NEUTROABS  --  2.9  HGB 12.4* 11.6*  HCT 38.3* 35.1*  MCV 89.3 86.5  PLT 308 282   Basic Metabolic Panel: Recent Labs  Lab  07/09/20 1029 07/11/20 1018  NA 137 134*  K 3.6 3.2*  CL 95* 93*  CO2 28 29  GLUCOSE 174* 107*  BUN 16 13  CREATININE 1.14 1.16  CALCIUM 9.1 9.2   GFR: Estimated Creatinine Clearance: 39 mL/min (by C-G formula based on SCr of 1.16 mg/dL). Liver Function Tests: No results for input(s): AST, ALT, ALKPHOS, BILITOT, PROT, ALBUMIN in the last 168 hours. No results for input(s): LIPASE, AMYLASE in the last 168 hours. No results for input(s): AMMONIA in the last 168 hours. Coagulation Profile: No results for input(s): INR, PROTIME in the last 168 hours. Cardiac Enzymes: No results for input(s): CKTOTAL, CKMB, CKMBINDEX, TROPONINI in the last 168 hours. BNP (last 3 results) No results for input(s): PROBNP in the last 8760 hours. HbA1C: Recent Labs    07/10/20 0706  HGBA1C 8.8*   CBG: Recent Labs  Lab 07/10/20 1155 07/10/20 1602 07/10/20 2102 07/11/20 0610 07/11/20 1120  GLUCAP 174* 95 294* 254* 110*   Lipid Profile: Recent Labs    07/10/20 0602  CHOL 172  HDL 58  LDLCALC 98  TRIG 82  CHOLHDL 3.0   Thyroid Function Tests: No results for input(s): TSH, T4TOTAL, FREET4, T3FREE, THYROIDAB in the last 72 hours. Anemia Panel: No results for input(s): VITAMINB12, FOLATE, FERRITIN, TIBC, IRON, RETICCTPCT in the last 72 hours. Sepsis Labs: Recent Labs  Lab 07/11/20 1018  LATICACIDVEN 3.2*    Recent Results (from the past 240 hour(s))  SARS CORONAVIRUS 2 (TAT 6-24 HRS) Nasopharyngeal Nasopharyngeal Swab     Status: None   Collection Time: 07/09/20  4:35 PM   Specimen: Nasopharyngeal Swab  Result Value Ref Range Status   SARS Coronavirus 2 NEGATIVE NEGATIVE Final    Comment: (NOTE) SARS-CoV-2 target nucleic acids are NOT DETECTED.  The SARS-CoV-2 RNA is generally detectable in upper and lower respiratory specimens during the acute phase of infection. Negative results do not preclude SARS-CoV-2 infection, do not rule out co-infections with other pathogens, and  should not be used as the sole basis for treatment or other patient management decisions. Negative results must be combined with clinical observations, patient history, and epidemiological information. The expected result is Negative.  Fact Sheet for Patients: HairSlick.no  Fact Sheet for Healthcare Providers: quierodirigir.com  This test is not yet approved or cleared by the [place] FDA and  has been authorized for detection and/or diagnosis of SARS-CoV-2 by FDA under an Emergency Use Authorization (EUA). This EUA will remain  in effect (meaning this test can be used) for the duration of the COVID-19 declaration under Se ction 564(b)(1) of the Act, 21 U.S.C. section 360bbb-3(b)(1), unless the authorization is terminated or revoked sooner.  Performed at [facility], [address], [place], [place] [address]          Radiology Studies: CT ANGIO HEAD NECK W WO CM  Result Date: 07/10/2020 CLINICAL DATA:  Initial evaluation for neuro deficit, stroke. EXAM: CT ANGIOGRAPHY HEAD AND NECK TECHNIQUE: Multidetector CT imaging of the head and neck was performed using the standard protocol during bolus administration of intravenous contrast. Multiplanar CT image reconstructions  and MIPs were obtained to evaluate the vascular anatomy. Carotid stenosis measurements (when applicable) are obtained utilizing NASCET criteria, using the distal internal carotid diameter as the denominator. CONTRAST:  75mL OMNIPAQUE IOHEXOL 350 MG/ML SOLN COMPARISON:  Prior study from 03/27/2020. FINDINGS: CTA NECK FINDINGS Aortic arch: Visualized aortic arch normal caliber with normal branch pattern. Moderate atheromatous change about the arch and origin of the great vessels without high-grade stenosis. Right carotid system: Right CCA patent from its origin to the bifurcation without stenosis. Scattered plaque about the right carotid bulb without  significant stenosis. Right ICA patent distally without stenosis, dissection or occlusion. Left carotid system: Left CCA patent from its origin to the bifurcation without stenosis. Eccentric calcified plaque about the left carotid bulb/proximal left ICA without significant stenosis. Left ICA patent distally without stenosis, dissection or occlusion. Vertebral arteries: Both vertebral arteries arise from the subclavian arteries. Left vertebral artery slightly dominant. There is a moderate 50% stenosis involving the proximal left subclavian artery prior to the takeoff of the left vertebral artery (series 7, image 301). Atheromatous change at the origins of both vertebral arteries with associated moderate stenosis on the right. Vertebral arteries patent distally without stenosis, dissection or occlusion. Skeleton: No visible acute osseous finding. No discrete or worrisome osseous lesions. Other neck: No other acute soft tissue abnormality within the neck. No mass or adenopathy. Upper chest: Visualized upper chest demonstrates no acute finding. Paraseptal emphysema. Review of the MIP images confirms the above findings CTA HEAD FINDINGS Anterior circulation: Petrous segments patent bilaterally. Scattered atheromatous change within the cavernous/supraclinoid segments of both internal carotid arteries. Associated approximate 50% stenosis at the proximal cavernous left ICA (series 7, image 118). Additional 50% stenosis about the anterior genu of the right ICA (series 7, image 111), stable from previous. A1 segments patent. Right A1 hypoplastic. Normal anterior communicating artery complex. Anterior cerebral arteries remain widely patent to their distal aspects. No M1 stenosis or occlusion. Normal MCA bifurcations. Distal MCA branches well perfused and symmetric. Posterior circulation: Both vertebral arteries patent to the vertebrobasilar junction without stenosis. Both PICA origins patent and normal. Basilar patent to its  distal aspect without stenosis. Superior cerebral arteries patent bilaterally. Both PCAs remain widely patent and well perfused or distal aspects. Venous sinuses: Grossly patent allowing for timing the contrast bolus. Anatomic variants: Hypoplastic right A1 segment. Dominant left vertebral artery. No aneurysm. Review of the MIP images confirms the above findings IMPRESSION: 1. Stable CTA of the head and neck.  No large vessel occlusion. 2. Atheromatous change about the carotid siphons with associated moderate 50% stenoses bilaterally, stable. 3. Moderate 50% stenoses involving the proximal left subclavian artery prior to the takeoff of the left vertebral artery. 4. Emphysema (ICD10-J43.9). Electronically Signed   By: Rise Mu M.D.   On: 07/10/2020 04:46   CT Head Wo Contrast  Result Date: 07/09/2020 CLINICAL DATA:  Altered mental status, recent infarction EXAM: CT HEAD WITHOUT CONTRAST TECHNIQUE: Contiguous axial images were obtained from the base of the skull through the vertex without intravenous contrast. COMPARISON:  CT and MR brain, 06/06/2020 FINDINGS: Brain: No evidence of hemorrhage, hydrocephalus, extra-axial collection or mass lesion/mass effect. There is multifocal bihemispheric hypodensity, which is generally in keeping with subacute evolution of acute diffusion restricting infarction seen on prior CT and MRI. There is at least one new region of hypodensity in the anterior left frontal lobe which does not correspond to a previously seen focus of acute diffusion restricting infarction (series 5, image 17). Vascular:  No hyperdense vessel or unexpected calcification. Skull: Normal. Negative for fracture or focal lesion. Sinuses/Orbits: No acute finding. Other: None. IMPRESSION: 1. Multifocal bihemispheric hypodensity, which is generally in keeping with subacute evolution of acute diffusion restricting infarction seen on prior CT and MRI. There is at least one new region of hypodensity in the  anterior left frontal lobe which does not correspond to a previously seen focus of acute diffusion restricting infarction. This constellation of findings suggests ongoing infarctions, perhaps from a central, embolic source. Consider repeat MRI to further evaluate. 2. No evidence of hemorrhage. Electronically Signed   By: Lauralyn Primes M.D.   On: 07/09/2020 14:37   MR BRAIN WO CONTRAST  Result Date: 07/10/2020 CLINICAL DATA:  Initial evaluation for neuro deficit, stroke suspected, confusion. EXAM: MRI HEAD WITHOUT CONTRAST TECHNIQUE: Multiplanar, multiecho pulse sequences of the brain and surrounding structures were obtained without intravenous contrast. COMPARISON:  Prior CT from 07/09/2020. FINDINGS: Brain: Examination moderately to severely degraded by motion artifact. Generalized age-related cerebral atrophy. Patchy T2/FLAIR hyperintensity within the periventricular and deep white matter both cerebral hemispheres most consistent with chronic small vessel ischemic disease, moderate in nature. Few scattered remote lacunar infarcts noted about the bilateral basal ganglia and hemispheric cerebral white matter. Scattered remote bilateral cerebellar infarcts noted. There has been interval evolution of previously identified infarcts involving the right greater than left cerebral hemispheres and right cerebellum. Relatively mild residual diffusion abnormality seen about the larger areas of infarction. Few scattered foci of petechial hemorrhage without frank hemorrhagic transformation or significant mass effect. New area of infarction involving the cortical and subcortical aspect of the left frontal lobe is seen, consistent with an acute ischemic infarct (series 20, image 84). Area measures up to approximately 2 cm in maximal diameter. No associated hemorrhage or mass effect. No other areas of acute ischemia identified. No acute intracranial hemorrhage. No mass lesion, mass effect, or midline shift. No hydrocephalus or  extra-axial fluid collection. Vascular: Major intracranial vascular flow voids are maintained. Skull and upper cervical spine: Craniocervical junction grossly within normal limits. Bone marrow signal intensity grossly normal. No visible scalp soft tissue abnormality. Sinuses/Orbits: Globes and orbital soft tissues demonstrate no acute finding. Mild mucosal thickening noted within the ethmoidal air cells. Paranasal sinuses are otherwise clear. No mastoid effusion. Other: None. IMPRESSION: 1. New acute ischemic nonhemorrhagic left frontal lobe infarct as above. 2. Continued interval evolution of multifocal subacute ischemic infarcts involving the right greater than left cerebral hemispheres and right cerebellum. Scattered areas of associated petechial hemorrhage without frank hemorrhagic transformation or significant mass effect. 3. Underlying age-related cerebral atrophy with moderate chronic small vessel ischemic disease. Electronically Signed   By: Rise Mu M.D.   On: 07/10/2020 04:09   DG Chest Port 1 View  Result Date: 07/09/2020 CLINICAL DATA:  Evaluate for pneumonia.  Failure to thrive. EXAM: PORTABLE CHEST 1 VIEW COMPARISON:  04/02/2020 FINDINGS: The heart size and mediastinal contours are within normal limits. Both lungs are clear. The visualized skeletal structures are unremarkable. IMPRESSION: No active disease. Electronically Signed   By: Norva Pavlov M.D.   On: 07/09/2020 13:42   ECHOCARDIOGRAM COMPLETE  Result Date: 07/10/2020    ECHOCARDIOGRAM REPORT   Patient Name:   William Acevedo Date of Exam: 07/10/2020 Medical Rec #:  [id number]           Height:       66.0 in Accession #:    [id number]  Weight:       97.0 lb Date of Birth:  1955-09-08           BSA:          1.470 m Patient Age:    65 years            BP:           146/79 mmHg Patient Gender: M                   HR:           86 bpm. Exam Location:  Inpatient Procedure: 2D Echo, Cardiac Doppler and Color Doppler  Indications:    Stroke I63.9  History:        Patient has prior history of Echocardiogram examinations, most                 recent 06/06/2020. Risk Factors:Current Smoker, Diabetes and                 Hypertension. Polysubstance abuse.  Sonographer:    Leta Jungling RDCS Referring Phys: [id number] Teddy Spike IMPRESSIONS  1. Left ventricular ejection fraction, by estimation, is 55 to 60%. The left ventricle has normal function. The left ventricle has no regional wall motion abnormalities. There is mild left ventricular hypertrophy of the basal-septal segment. Left ventricular diastolic parameters are consistent with Grade I diastolic dysfunction (impaired relaxation).  2. Right ventricular systolic function is normal. The right ventricular size is normal.  3. The mitral valve is normal in structure. No evidence of mitral valve regurgitation. No evidence of mitral stenosis. Moderate mitral annular calcification.  4. The aortic valve is tricuspid. Aortic valve regurgitation is trivial. No aortic stenosis is present.  5. The inferior vena cava is normal in size with greater than 50% respiratory variability, suggesting right atrial pressure of 3 mmHg. FINDINGS  Left Ventricle: Left ventricular ejection fraction, by estimation, is 55 to 60%. The left ventricle has normal function. The left ventricle has no regional wall motion abnormalities. The left ventricular internal cavity size was normal in size. There is  mild left ventricular hypertrophy of the basal-septal segment. Left ventricular diastolic parameters are consistent with Grade I diastolic dysfunction (impaired relaxation). Right Ventricle: The right ventricular size is normal.Right ventricular systolic function is normal. Left Atrium: Left atrial size was normal in size. Right Atrium: Right atrial size was normal in size. Pericardium: There is no evidence of pericardial effusion. Mitral Valve: The mitral valve is normal in structure. Moderate mitral annular  calcification. No evidence of mitral valve regurgitation. No evidence of mitral valve stenosis. Tricuspid Valve: The tricuspid valve is normal in structure. Tricuspid valve regurgitation is trivial. No evidence of tricuspid stenosis. Aortic Valve: The aortic valve is tricuspid. Aortic valve regurgitation is trivial. No aortic stenosis is present. Pulmonic Valve: The pulmonic valve was normal in structure. Pulmonic valve regurgitation is not visualized. No evidence of pulmonic stenosis. Aorta: The aortic root is normal in size and structure. Venous: The inferior vena cava is normal in size with greater than 50% respiratory variability, suggesting right atrial pressure of 3 mmHg. IAS/Shunts: No atrial level shunt detected by color flow Doppler.  LEFT VENTRICLE PLAX 2D LVIDd:         4.10 cm  Diastology LVIDs:         3.10 cm  LV e' medial:    3.87 cm/s LV PW:         0.90 cm  LV  E/e' medial:  13.7 LV IVS:        0.70 cm  LV e' lateral:   8.51 cm/s LVOT diam:     1.60 cm  LV E/e' lateral: 6.2 LV SV:         23 LV SV Index:   16 LVOT Area:     2.01 cm  RIGHT VENTRICLE RV S prime:     11.50 cm/s TAPSE (M-mode): 1.8 cm LEFT ATRIUM             Index       RIGHT ATRIUM          Index LA diam:        2.30 cm 1.56 cm/m  RA Area:     5.26 cm LA Vol (A2C):   14.6 ml 9.93 ml/m  RA Volume:   7.85 ml  5.34 ml/m LA Vol (A4C):   14.6 ml 9.93 ml/m LA Biplane Vol: 14.9 ml 10.13 ml/m  AORTIC VALVE LVOT Vmax:   68.00 cm/s LVOT Vmean:  49.600 cm/s LVOT VTI:    0.115 m  AORTA Ao Root diam: 2.60 cm MITRAL VALVE MV Area (PHT): 3.11 cm    SHUNTS MV Decel Time: 244 msec    Systemic VTI:  0.12 m MV E velocity: 53.00 cm/s  Systemic Diam: 1.60 cm MV A velocity: 93.90 cm/s MV E/A ratio:  0.56 Olga Millers MD Electronically signed by Olga Millers MD Signature Date/Time: 07/10/2020/1:56:09 PM    Final         Scheduled Meds:  iohexol       aspirin EC  81 mg Oral Daily   atorvastatin  20 mg Oral Daily   clopidogrel  75 mg Oral  Daily   enoxaparin (LOVENOX) injection  30 mg Subcutaneous Q24H   insulin aspart  0-9 Units Subcutaneous TID WC   Continuous Infusions:  sodium chloride       LOS: 2 days    Time spent: 25    Burke Keels, MD [facility]  If 7PM-7AM, please contact night-coverage  07/11/2020, 12:02 PM

## 2020-07-12 DIAGNOSIS — E118 Type 2 diabetes mellitus with unspecified complications: Secondary | ICD-10-CM | POA: Diagnosis not present

## 2020-07-12 DIAGNOSIS — R778 Other specified abnormalities of plasma proteins: Secondary | ICD-10-CM | POA: Diagnosis not present

## 2020-07-12 DIAGNOSIS — E1169 Type 2 diabetes mellitus with other specified complication: Secondary | ICD-10-CM | POA: Diagnosis not present

## 2020-07-12 DIAGNOSIS — I10 Essential (primary) hypertension: Secondary | ICD-10-CM | POA: Diagnosis not present

## 2020-07-12 LAB — CBC WITH DIFFERENTIAL/PLATELET
Abs Immature Granulocytes: 0.01 10*3/uL (ref 0.00–0.07)
Basophils Absolute: 0 10*3/uL (ref 0.0–0.1)
Basophils Relative: 0 %
Eosinophils Absolute: 0 10*3/uL (ref 0.0–0.5)
Eosinophils Relative: 0 %
HCT: 28.7 % — ABNORMAL LOW (ref 39.0–52.0)
Hemoglobin: 9.7 g/dL — ABNORMAL LOW (ref 13.0–17.0)
Immature Granulocytes: 0 %
Lymphocytes Relative: 50 %
Lymphs Abs: 2.3 10*3/uL (ref 0.7–4.0)
MCH: 29 pg (ref 26.0–34.0)
MCHC: 33.8 g/dL (ref 30.0–36.0)
MCV: 85.9 fL (ref 80.0–100.0)
Monocytes Absolute: 0.4 10*3/uL (ref 0.1–1.0)
Monocytes Relative: 8 %
Neutro Abs: 2 10*3/uL (ref 1.7–7.7)
Neutrophils Relative %: 42 %
Platelets: 231 10*3/uL (ref 150–400)
RBC: 3.34 MIL/uL — ABNORMAL LOW (ref 4.22–5.81)
RDW: 12.6 % (ref 11.5–15.5)
WBC: 4.7 10*3/uL (ref 4.0–10.5)
nRBC: 0 % (ref 0.0–0.2)

## 2020-07-12 LAB — GLUCOSE, CAPILLARY
Glucose-Capillary: 178 mg/dL — ABNORMAL HIGH (ref 70–99)
Glucose-Capillary: 181 mg/dL — ABNORMAL HIGH (ref 70–99)
Glucose-Capillary: 139 mg/dL — ABNORMAL HIGH (ref 70–99)
Glucose-Capillary: 96 mg/dL (ref 70–99)

## 2020-07-12 LAB — BASIC METABOLIC PANEL
Anion gap: 7 (ref 5–15)
BUN: 10 mg/dL (ref 8–23)
CO2: 28 mmol/L (ref 22–32)
Calcium: 8.5 mg/dL — ABNORMAL LOW (ref 8.9–10.3)
Chloride: 97 mmol/L — ABNORMAL LOW (ref 98–111)
Creatinine, Ser: 0.84 mg/dL (ref 0.61–1.24)
GFR, Estimated: >60 mL/min (ref >60)
Glucose, Bld: 158 mg/dL — ABNORMAL HIGH (ref 70–99)
Potassium: 3.4 mmol/L — ABNORMAL LOW (ref 3.5–5.1)
Sodium: 132 mmol/L — ABNORMAL LOW (ref 135–145)

## 2020-07-12 MED ORDER — HYDROMORPHONE HCL 1 MG/ML IJ SOLN
1.0000 mg | INTRAMUSCULAR | Status: DC | PRN
  Administered 2020-07-12 – 2020-07-13 (×4): 1 mg via INTRAVENOUS
  Filled 2020-07-12 (×4): qty 1

## 2020-07-12 MED ORDER — SODIUM CHLORIDE 0.9 % IV SOLN
12.5000 mg | Freq: Four times a day (QID) | INTRAVENOUS | Status: DC | PRN
  Filled 2020-07-12: qty 0.5

## 2020-07-12 MED ORDER — SODIUM CHLORIDE 0.9 % IV SOLN: INTRAVENOUS | Status: AC

## 2020-07-12 NOTE — Plan of Care (Signed)
  Problem: Education: Goal: Knowledge of secondary prevention will improve Outcome: Progressing Goal: Knowledge of patient specific risk factors addressed and post discharge goals established will improve Outcome: Progressing   Problem: Education: Goal: Knowledge of General Education information will improve Description: Including pain rating scale, medication(s)/side effects and non-pharmacologic comfort measures Outcome: Progressing   Problem: Health Behavior/Discharge Planning: Goal: Ability to manage health-related needs will improve Outcome: Progressing   Problem: Clinical Measurements: Goal: Ability to maintain clinical measurements within normal limits will improve Outcome: Progressing   Problem: Activity: Goal: Risk for activity intolerance will decrease Outcome: Progressing   Problem: Nutrition: Goal: Adequate nutrition will be maintained Outcome: Progressing   Problem: Coping: Goal: Level of anxiety will decrease Outcome: Progressing   Problem: Elimination: Goal: Will not experience complications related to bowel motility Outcome: Progressing Goal: Will not experience complications related to urinary retention Outcome: Progressing   Problem: Pain Managment: Goal: General experience of comfort will improve Outcome: Progressing   Problem: Safety: Goal: Ability to remain free from injury will improve Outcome: Progressing

## 2020-07-12 NOTE — Progress Notes (Signed)
PROGRESS NOTE    MAYA SCHOLER  ZOX:[id number] DOB: 12/07/1955 DOA: 07/09/2020 PCP: Pcp, No   Brief Narrative:  GARRETH BURNSWORTH is a 65 y.o. male with medical history significant of CVA, HTN, DM, cocaine abuse. Presenting with confusion. History is from his brother. He reports that the patient was in his normal state of health yesterday morning. He left the patient in his house and went to run errands. When he returned, he found his house in complete disarray. The patient seemed confused while sitting in the middle of the floor and staring off into space. He eventually rolled up into a ball on the floor and vomited. He continued to be nauseous throughout the night. His brother became concerned and brought him to the ED   Patient with abdominal pain overnight, received Dilaudid x3 doses Patient was resting comfortably in bed when I saw him this morning without complaint Ordered CBC, BMP, lactic acid, CT of abdomen pelvis   Assessment & Plan:   Principal Problem:   CVA (cerebral vascular accident) (HCC) Active Problems:   Polysubstance abuse (HCC)   Cocaine use disorder, severe, dependence (HCC)   Abdominal pain   Nausea & vomiting   Acute CVA Hx of CVA Confusion     - admitted to inpt, tele at [facility]     - c/w ASA PLAVIX for 3 weeks then aspirin alone     -continue statin     - neuro consulted     - PT/OT/SLP pending-pt requests SNF/STR     - lipid panel LDL 98,      - A1c 8.8     - echo, carotid dopplers both pending     -start to titrate patient's blood pressure medications back in     -added as needed hydralazine   N/V and abdominal pain  - denies nausea and vomiting complaints now, will have PRN available -Because of abdominal pain check lactic acid mildly elevated, -Added normal saline for hydration as patient is thin cachectic and appears somewhat dehydrated -Follow-up CT abdomen pelvis with contrast -White blood cell count normal and no fevers -Benign  abdominal exam currently   Hx of cocaine abuse     - PENDING UDS,-patient still not providing urine sample   DM2     - SSI, DM diet. glucose checks   HTN -titrating medications back in -Amlodipine 10 mg daily   Elevated trp - mild, trend; no st elevations on EKG, no chest pain -As above   Normocytic anemia     - at baseline Hgb, no evidence of bleed, follow    DVT prophylaxis: SCD/Compression stockings  Code Status: full    Code Status Orders  (From admission, onward)           Start     Ordered   07/09/20 2102  Full code  Continuous        07/09/20 2101           Code Status History     Date Active Date Inactive Code Status Order ID Comments User Context   06/06/2020 0201 06/09/2020 2256 Full Code [id number]  Briscoe Deutscher, MD ED   04/14/2020 1530 04/19/2020 2037 Full Code [id number]  Clydie Braun, MD ED   04/02/2020 2218 04/05/2020 2051 Full Code [id number]  Margie Ege A, DO ED   03/25/2020 1759 03/30/2020 2208 Full Code [id number]  Lanae Boast, MD ED   04/07/2019 0401 04/08/2019 2231 Full Code [id number]  Fair, Hoyle Sauer, MD  ED   08/26/2016 1421 08/27/2016 1618 Full Code [id number]  Marily Memos, MD ED   08/26/2016 1159 08/26/2016 1421 Full Code [id number]  Albesa Seen, NP ED   07/06/2015 0025 07/06/2015 1653 Full Code [id number]  Hillary Bow, DO ED   09/04/2014 2047 09/05/2014 2011 Full Code [id number]  Earley Favor, NP ED      Family Communication: tried calling brother, NA again, will try again on monday Disposition Plan:   Family is unable to take care of patient, social work involved for disposition Consults called:  NEURO Admission status: Inpatient     Consultants:  As above  Procedures:  CT ANGIO HEAD NECK W WO CM  Result Date: 07/10/2020 CLINICAL DATA:  Initial evaluation for neuro deficit, stroke. EXAM: CT ANGIOGRAPHY HEAD AND NECK TECHNIQUE: Multidetector CT imaging of the head and neck was performed using the standard protocol during bolus  administration of intravenous contrast. Multiplanar CT image reconstructions and MIPs were obtained to evaluate the vascular anatomy. Carotid stenosis measurements (when applicable) are obtained utilizing NASCET criteria, using the distal internal carotid diameter as the denominator. CONTRAST:  54mL OMNIPAQUE IOHEXOL 350 MG/ML SOLN COMPARISON:  Prior study from 03/27/2020. FINDINGS: CTA NECK FINDINGS Aortic arch: Visualized aortic arch normal caliber with normal branch pattern. Moderate atheromatous change about the arch and origin of the great vessels without high-grade stenosis. Right carotid system: Right CCA patent from its origin to the bifurcation without stenosis. Scattered plaque about the right carotid bulb without significant stenosis. Right ICA patent distally without stenosis, dissection or occlusion. Left carotid system: Left CCA patent from its origin to the bifurcation without stenosis. Eccentric calcified plaque about the left carotid bulb/proximal left ICA without significant stenosis. Left ICA patent distally without stenosis, dissection or occlusion. Vertebral arteries: Both vertebral arteries arise from the subclavian arteries. Left vertebral artery slightly dominant. There is a moderate 50% stenosis involving the proximal left subclavian artery prior to the takeoff of the left vertebral artery (series 7, image 301). Atheromatous change at the origins of both vertebral arteries with associated moderate stenosis on the right. Vertebral arteries patent distally without stenosis, dissection or occlusion. Skeleton: No visible acute osseous finding. No discrete or worrisome osseous lesions. Other neck: No other acute soft tissue abnormality within the neck. No mass or adenopathy. Upper chest: Visualized upper chest demonstrates no acute finding. Paraseptal emphysema. Review of the MIP images confirms the above findings CTA HEAD FINDINGS Anterior circulation: Petrous segments patent bilaterally.  Scattered atheromatous change within the cavernous/supraclinoid segments of both internal carotid arteries. Associated approximate 50% stenosis at the proximal cavernous left ICA (series 7, image 118). Additional 50% stenosis about the anterior genu of the right ICA (series 7, image 111), stable from previous. A1 segments patent. Right A1 hypoplastic. Normal anterior communicating artery complex. Anterior cerebral arteries remain widely patent to their distal aspects. No M1 stenosis or occlusion. Normal MCA bifurcations. Distal MCA branches well perfused and symmetric. Posterior circulation: Both vertebral arteries patent to the vertebrobasilar junction without stenosis. Both PICA origins patent and normal. Basilar patent to its distal aspect without stenosis. Superior cerebral arteries patent bilaterally. Both PCAs remain widely patent and well perfused or distal aspects. Venous sinuses: Grossly patent allowing for timing the contrast bolus. Anatomic variants: Hypoplastic right A1 segment. Dominant left vertebral artery. No aneurysm. Review of the MIP images confirms the above findings IMPRESSION: 1. Stable CTA of the head and neck.  No large vessel occlusion. 2. Atheromatous change about the carotid siphons  with associated moderate 50% stenoses bilaterally, stable. 3. Moderate 50% stenoses involving the proximal left subclavian artery prior to the takeoff of the left vertebral artery. 4. Emphysema (ICD10-J43.9). Electronically Signed   By: Rise Mu M.D.   On: 07/10/2020 04:46   CT Head Wo Contrast  Result Date: 07/09/2020 CLINICAL DATA:  Altered mental status, recent infarction EXAM: CT HEAD WITHOUT CONTRAST TECHNIQUE: Contiguous axial images were obtained from the base of the skull through the vertex without intravenous contrast. COMPARISON:  CT and MR brain, 06/06/2020 FINDINGS: Brain: No evidence of hemorrhage, hydrocephalus, extra-axial collection or mass lesion/mass effect. There is multifocal  bihemispheric hypodensity, which is generally in keeping with subacute evolution of acute diffusion restricting infarction seen on prior CT and MRI. There is at least one new region of hypodensity in the anterior left frontal lobe which does not correspond to a previously seen focus of acute diffusion restricting infarction (series 5, image 17). Vascular: No hyperdense vessel or unexpected calcification. Skull: Normal. Negative for fracture or focal lesion. Sinuses/Orbits: No acute finding. Other: None. IMPRESSION: 1. Multifocal bihemispheric hypodensity, which is generally in keeping with subacute evolution of acute diffusion restricting infarction seen on prior CT and MRI. There is at least one new region of hypodensity in the anterior left frontal lobe which does not correspond to a previously seen focus of acute diffusion restricting infarction. This constellation of findings suggests ongoing infarctions, perhaps from a central, embolic source. Consider repeat MRI to further evaluate. 2. No evidence of hemorrhage. Electronically Signed   By: Lauralyn Primes M.D.   On: 07/09/2020 14:37   MR BRAIN WO CONTRAST  Result Date: 07/10/2020 CLINICAL DATA:  Initial evaluation for neuro deficit, stroke suspected, confusion. EXAM: MRI HEAD WITHOUT CONTRAST TECHNIQUE: Multiplanar, multiecho pulse sequences of the brain and surrounding structures were obtained without intravenous contrast. COMPARISON:  Prior CT from 07/09/2020. FINDINGS: Brain: Examination moderately to severely degraded by motion artifact. Generalized age-related cerebral atrophy. Patchy T2/FLAIR hyperintensity within the periventricular and deep white matter both cerebral hemispheres most consistent with chronic small vessel ischemic disease, moderate in nature. Few scattered remote lacunar infarcts noted about the bilateral basal ganglia and hemispheric cerebral white matter. Scattered remote bilateral cerebellar infarcts noted. There has been interval  evolution of previously identified infarcts involving the right greater than left cerebral hemispheres and right cerebellum. Relatively mild residual diffusion abnormality seen about the larger areas of infarction. Few scattered foci of petechial hemorrhage without frank hemorrhagic transformation or significant mass effect. New area of infarction involving the cortical and subcortical aspect of the left frontal lobe is seen, consistent with an acute ischemic infarct (series 20, image 84). Area measures up to approximately 2 cm in maximal diameter. No associated hemorrhage or mass effect. No other areas of acute ischemia identified. No acute intracranial hemorrhage. No mass lesion, mass effect, or midline shift. No hydrocephalus or extra-axial fluid collection. Vascular: Major intracranial vascular flow voids are maintained. Skull and upper cervical spine: Craniocervical junction grossly within normal limits. Bone marrow signal intensity grossly normal. No visible scalp soft tissue abnormality. Sinuses/Orbits: Globes and orbital soft tissues demonstrate no acute finding. Mild mucosal thickening noted within the ethmoidal air cells. Paranasal sinuses are otherwise clear. No mastoid effusion. Other: None. IMPRESSION: 1. New acute ischemic nonhemorrhagic left frontal lobe infarct as above. 2. Continued interval evolution of multifocal subacute ischemic infarcts involving the right greater than left cerebral hemispheres and right cerebellum. Scattered areas of associated petechial hemorrhage without frank hemorrhagic transformation or  significant mass effect. 3. Underlying age-related cerebral atrophy with moderate chronic small vessel ischemic disease. Electronically Signed   By: Rise Mu M.D.   On: 07/10/2020 04:09   CT ABDOMEN PELVIS W CONTRAST  Result Date: 07/11/2020 CLINICAL DATA:  Abdominal pain. EXAM: CT ABDOMEN AND PELVIS WITH CONTRAST TECHNIQUE: Multidetector CT imaging of the abdomen and  pelvis was performed using the standard protocol following bolus administration of intravenous contrast. CONTRAST:  OMNIPAQUE IOHEXOL 300 MG/ML  SOLN COMPARISON:  CT chest, abdomen and pelvis, 06/06/2020. FINDINGS: Lower chest: Clear lung bases.  Heart normal in size. Hepatobiliary: No focal liver abnormality is seen. No gallstones, gallbladder wall thickening, or biliary dilatation. Pancreas: Unremarkable. No pancreatic ductal dilatation or surrounding inflammatory changes. Spleen: Normal in size without focal abnormality. Adrenals/Urinary Tract: No adrenal masses. Kidneys normal in size, orientation and position. 2 small low-density lesions in the right kidney, largest at the midpole, 11 mm, both consistent with cysts. Several small nonobstructing stones in the right kidney. Tiny low-density left renal lesions likely cysts. No left intrarenal stones. No hydronephrosis. Ureters not well visualized, without apparent dilation and no evidence of a stone. Normal bladder. Stomach/Bowel: Stomach is unremarkable. Small bowel and colon are normal in caliber. No wall thickening or convincing inflammation. Colonic stool burden moderately increased. Appendix not visualized. No evidence of appendicitis. Vascular/Lymphatic: Dense aortic atherosclerotic calcifications. No aneurysm. No enlarged lymph nodes. Reproductive: Enlarged prostate, 4.9 x 3.8 cm transversely. Other: Tiny bubbles of air are noted in the right mid to lower quadrant anterior abdominal wall musculature, of unclear etiology and significance, possibly from an anterior abdominal wall injection. No abdominal wall hernia or abnormality. No abdominopelvic ascites. Musculoskeletal: No fracture or acute finding.  No bone lesion. IMPRESSION: 1. Small bubbles of air noted in the right mid to lower quadrant anterior abdominal wall musculature without associated mass, fluid or inflammation. This is nonspecific. Correlate with any history of an abdominal wall  injection. 2. No other evidence of an acute abnormality within the abdomen or pelvis. 3. No bowel obstruction or inflammation. Moderate increase in the colonic stool burden. 4. Dense aortic atherosclerotic calcifications. Electronically Signed   By: Amie Portland M.D.   On: 07/11/2020 15:32   DG Chest Port 1 View  Result Date: 07/09/2020 CLINICAL DATA:  Evaluate for pneumonia.  Failure to thrive. EXAM: PORTABLE CHEST 1 VIEW COMPARISON:  04/02/2020 FINDINGS: The heart size and mediastinal contours are within normal limits. Both lungs are clear. The visualized skeletal structures are unremarkable. IMPRESSION: No active disease. Electronically Signed   By: Norva Pavlov M.D.   On: 07/09/2020 13:42   ECHOCARDIOGRAM COMPLETE  Result Date: 07/10/2020    ECHOCARDIOGRAM REPORT   Patient Name:   MY MADARIAGA Date of Exam: 07/10/2020 Medical Rec #:  [id number]           Height:       66.0 in Accession #:    [id number]          Weight:       97.0 lb Date of Birth:  June 30, 1955           BSA:          1.470 m Patient Age:    65 years            BP:           146/79 mmHg Patient Gender: M  HR:           86 bpm. Exam Location:  Inpatient Procedure: 2D Echo, Cardiac Doppler and Color Doppler Indications:    Stroke I63.9  History:        Patient has prior history of Echocardiogram examinations, most                 recent 06/06/2020. Risk Factors:Current Smoker, Diabetes and                 Hypertension. Polysubstance abuse.  Sonographer:    Leta Jungling RDCS Referring Phys: [id number] Teddy Spike IMPRESSIONS  1. Left ventricular ejection fraction, by estimation, is 55 to 60%. The left ventricle has normal function. The left ventricle has no regional wall motion abnormalities. There is mild left ventricular hypertrophy of the basal-septal segment. Left ventricular diastolic parameters are consistent with Grade I diastolic dysfunction (impaired relaxation).  2. Right ventricular systolic function is  normal. The right ventricular size is normal.  3. The mitral valve is normal in structure. No evidence of mitral valve regurgitation. No evidence of mitral stenosis. Moderate mitral annular calcification.  4. The aortic valve is tricuspid. Aortic valve regurgitation is trivial. No aortic stenosis is present.  5. The inferior vena cava is normal in size with greater than 50% respiratory variability, suggesting right atrial pressure of 3 mmHg. FINDINGS  Left Ventricle: Left ventricular ejection fraction, by estimation, is 55 to 60%. The left ventricle has normal function. The left ventricle has no regional wall motion abnormalities. The left ventricular internal cavity size was normal in size. There is  mild left ventricular hypertrophy of the basal-septal segment. Left ventricular diastolic parameters are consistent with Grade I diastolic dysfunction (impaired relaxation). Right Ventricle: The right ventricular size is normal.Right ventricular systolic function is normal. Left Atrium: Left atrial size was normal in size. Right Atrium: Right atrial size was normal in size. Pericardium: There is no evidence of pericardial effusion. Mitral Valve: The mitral valve is normal in structure. Moderate mitral annular calcification. No evidence of mitral valve regurgitation. No evidence of mitral valve stenosis. Tricuspid Valve: The tricuspid valve is normal in structure. Tricuspid valve regurgitation is trivial. No evidence of tricuspid stenosis. Aortic Valve: The aortic valve is tricuspid. Aortic valve regurgitation is trivial. No aortic stenosis is present. Pulmonic Valve: The pulmonic valve was normal in structure. Pulmonic valve regurgitation is not visualized. No evidence of pulmonic stenosis. Aorta: The aortic root is normal in size and structure. Venous: The inferior vena cava is normal in size with greater than 50% respiratory variability, suggesting right atrial pressure of 3 mmHg. IAS/Shunts: No atrial level shunt  detected by color flow Doppler.  LEFT VENTRICLE PLAX 2D LVIDd:         4.10 cm  Diastology LVIDs:         3.10 cm  LV e' medial:    3.87 cm/s LV PW:         0.90 cm  LV E/e' medial:  13.7 LV IVS:        0.70 cm  LV e' lateral:   8.51 cm/s LVOT diam:     1.60 cm  LV E/e' lateral: 6.2 LV SV:         23 LV SV Index:   16 LVOT Area:     2.01 cm  RIGHT VENTRICLE RV S prime:     11.50 cm/s TAPSE (M-mode): 1.8 cm LEFT ATRIUM  Index       RIGHT ATRIUM          Index LA diam:        2.30 cm 1.56 cm/m  RA Area:     5.26 cm LA Vol (A2C):   14.6 ml 9.93 ml/m  RA Volume:   7.85 ml  5.34 ml/m LA Vol (A4C):   14.6 ml 9.93 ml/m LA Biplane Vol: 14.9 ml 10.13 ml/m  AORTIC VALVE LVOT Vmax:   68.00 cm/s LVOT Vmean:  49.600 cm/s LVOT VTI:    0.115 m  AORTA Ao Root diam: 2.60 cm MITRAL VALVE MV Area (PHT): 3.11 cm    SHUNTS MV Decel Time: 244 msec    Systemic VTI:  0.12 m MV E velocity: 53.00 cm/s  Systemic Diam: 1.60 cm MV A velocity: 93.90 cm/s MV E/A ratio:  0.56 Olga Millers MD Electronically signed by Olga Millers MD Signature Date/Time: 07/10/2020/1:56:09 PM    Final       Subjective:  Sleeping soundly upon entering the room No evid of pain  Objective: Vitals:   07/11/20 1940 07/11/20 2345 07/12/20 0357 07/12/20 0713  BP: 118/71 119/72 (!) 147/82 (!) 183/91  Pulse: 81 73 71 73  Resp: 15 16 16 16   Temp: 97.9 F (36.6 C) 98.1 F (36.7 C) 98 F (36.7 C) 98.3 F (36.8 C)  TempSrc: Oral Oral  Oral  SpO2: 100% 100% 100% 100%  Weight:      Height:        Intake/Output Summary (Last 24 hours) at 07/12/2020 1123 Last data filed at 07/11/2020 2241 Gross per 24 hour  Intake 913.53 ml  Output --  Net 913.53 ml   Filed Weights   07/09/20 1844  Weight: 44 kg    Examination:  General exam: Appears calm and comfortable, still no evidence of abdominal pain, chronically thin, no recent weight loss Respiratory system: Clear to auscultation. Respiratory effort normal. Cardiovascular system:  S1 & S2 heard, RRR. No JVD, murmurs, rubs, gallops or clicks. No pedal edema. Gastrointestinal system: Abdomen is nondistended, soft and nontender. RLQ benign exam, no evid of crepitus,  Normal bowel sounds heard.  No acute findings not rigid Central nervous system: Alert and oriented. No focal neurological deficits. Extremities: Thin, but warm well perfused Skin: No rashes, lesions or ulcers Psychiatry: Judgement and insight appear normal. Mood & affect appropriate     Data Reviewed: I have personally reviewed following labs and imaging studies  CBC: Recent Labs  Lab 07/09/20 1029 07/11/20 1018 07/12/20 0238  WBC 5.7 5.6 4.7  NEUTROABS  --  2.9 2.0  HGB 12.4* 11.6* 9.7*  HCT 38.3* 35.1* 28.7*  MCV 89.3 86.5 85.9  PLT 308 282 231   Basic Metabolic Panel: Recent Labs  Lab 07/09/20 1029 07/11/20 1018 07/12/20 0238  NA 137 134* 132*  K 3.6 3.2* 3.4*  CL 95* 93* 97*  CO2 28 29 28   GLUCOSE 174* 107* 158*  BUN 16 13 10   CREATININE 1.14 1.16 0.84  CALCIUM 9.1 9.2 8.5*   GFR: Estimated Creatinine Clearance: 53.8 mL/min (by C-G formula based on SCr of 0.84 mg/dL). Liver Function Tests: No results for input(s): AST, ALT, ALKPHOS, BILITOT, PROT, ALBUMIN in the last 168 hours. No results for input(s): LIPASE, AMYLASE in the last 168 hours. No results for input(s): AMMONIA in the last 168 hours. Coagulation Profile: No results for input(s): INR, PROTIME in the last 168 hours. Cardiac Enzymes: No results for input(s): CKTOTAL, CKMB,  CKMBINDEX, TROPONINI in the last 168 hours. BNP (last 3 results) No results for input(s): PROBNP in the last 8760 hours. HbA1C: Recent Labs    07/10/20 0706  HGBA1C 8.8*   CBG: Recent Labs  Lab 07/11/20 0610 07/11/20 1120 07/11/20 1552 07/11/20 2117 07/12/20 0610  GLUCAP 254* 110* 132* 161* 178*   Lipid Profile: Recent Labs    07/10/20 0602  CHOL 172  HDL 58  LDLCALC 98  TRIG 82  CHOLHDL 3.0   Thyroid Function Tests: No  results for input(s): TSH, T4TOTAL, FREET4, T3FREE, THYROIDAB in the last 72 hours. Anemia Panel: No results for input(s): VITAMINB12, FOLATE, FERRITIN, TIBC, IRON, RETICCTPCT in the last 72 hours. Sepsis Labs: Recent Labs  Lab 07/11/20 1018 07/11/20 1648  LATICACIDVEN 3.2* 1.9    Recent Results (from the past 240 hour(s))  SARS CORONAVIRUS 2 (TAT 6-24 HRS) Nasopharyngeal Nasopharyngeal Swab     Status: None   Collection Time: 07/09/20  4:35 PM   Specimen: Nasopharyngeal Swab  Result Value Ref Range Status   SARS Coronavirus 2 NEGATIVE NEGATIVE Final    Comment: (NOTE) SARS-CoV-2 target nucleic acids are NOT DETECTED.  The SARS-CoV-2 RNA is generally detectable in upper and lower respiratory specimens during the acute phase of infection. Negative results do not preclude SARS-CoV-2 infection, do not rule out co-infections with other pathogens, and should not be used as the sole basis for treatment or other patient management decisions. Negative results must be combined with clinical observations, patient history, and epidemiological information. The expected result is Negative.  Fact Sheet for Patients: HairSlick.no  Fact Sheet for Healthcare Providers: quierodirigir.com  This test is not yet approved or cleared by the [place] FDA and  has been authorized for detection and/or diagnosis of SARS-CoV-2 by FDA under an Emergency Use Authorization (EUA). This EUA will remain  in effect (meaning this test can be used) for the duration of the COVID-19 declaration under Se ction 564(b)(1) of the Act, 21 U.S.C. section 360bbb-3(b)(1), unless the authorization is terminated or revoked sooner.  Performed at [facility], [address], [place], [place] [address]          Radiology Studies: CT ABDOMEN PELVIS W CONTRAST  Result Date: 07/11/2020 CLINICAL DATA:  Abdominal pain. EXAM: CT ABDOMEN AND PELVIS WITH  CONTRAST TECHNIQUE: Multidetector CT imaging of the abdomen and pelvis was performed using the standard protocol following bolus administration of intravenous contrast. CONTRAST:  OMNIPAQUE IOHEXOL 300 MG/ML  SOLN COMPARISON:  CT chest, abdomen and pelvis, 06/06/2020. FINDINGS: Lower chest: Clear lung bases.  Heart normal in size. Hepatobiliary: No focal liver abnormality is seen. No gallstones, gallbladder wall thickening, or biliary dilatation. Pancreas: Unremarkable. No pancreatic ductal dilatation or surrounding inflammatory changes. Spleen: Normal in size without focal abnormality. Adrenals/Urinary Tract: No adrenal masses. Kidneys normal in size, orientation and position. 2 small low-density lesions in the right kidney, largest at the midpole, 11 mm, both consistent with cysts. Several small nonobstructing stones in the right kidney. Tiny low-density left renal lesions likely cysts. No left intrarenal stones. No hydronephrosis. Ureters not well visualized, without apparent dilation and no evidence of a stone. Normal bladder. Stomach/Bowel: Stomach is unremarkable. Small bowel and colon are normal in caliber. No wall thickening or convincing inflammation. Colonic stool burden moderately increased. Appendix not visualized. No evidence of appendicitis. Vascular/Lymphatic: Dense aortic atherosclerotic calcifications. No aneurysm. No enlarged lymph nodes. Reproductive: Enlarged prostate, 4.9 x 3.8 cm transversely. Other: Tiny bubbles of air are  noted in the right mid to lower quadrant anterior abdominal wall musculature, of unclear etiology and significance, possibly from an anterior abdominal wall injection. No abdominal wall hernia or abnormality. No abdominopelvic ascites. Musculoskeletal: No fracture or acute finding.  No bone lesion. IMPRESSION: 1. Small bubbles of air noted in the right mid to lower quadrant anterior abdominal wall musculature without associated mass, fluid or inflammation. This is  nonspecific. Correlate with any history of an abdominal wall injection. 2. No other evidence of an acute abnormality within the abdomen or pelvis. 3. No bowel obstruction or inflammation. Moderate increase in the colonic stool burden. 4. Dense aortic atherosclerotic calcifications. Electronically Signed   By: Amie Portlandavid  Ormond M.D.   On: 07/11/2020 15:32   ECHOCARDIOGRAM COMPLETE  Result Date: 07/10/2020    ECHOCARDIOGRAM REPORT   Patient Name:   Melodie BouillonCHARLIE C Spikes Date of Exam: 07/10/2020 Medical Rec #:  [id number]           Height:       66.0 in Accession #:    [id number]          Weight:       97.0 lb Date of Birth:  Apr 10, 1955           BSA:          1.470 m Patient Age:    65 years            BP:           146/79 mmHg Patient Gender: M                   HR:           86 bpm. Exam Location:  Inpatient Procedure: 2D Echo, Cardiac Doppler and Color Doppler Indications:    Stroke I63.9  History:        Patient has prior history of Echocardiogram examinations, most                 recent 06/06/2020. Risk Factors:Current Smoker, Diabetes and                 Hypertension. Polysubstance abuse.  Sonographer:    Leta Junglingiffany Cooper RDCS Referring Phys: [id number] Teddy SpikeYRONE A KYLE IMPRESSIONS  1. Left ventricular ejection fraction, by estimation, is 55 to 60%. The left ventricle has normal function. The left ventricle has no regional wall motion abnormalities. There is mild left ventricular hypertrophy of the basal-septal segment. Left ventricular diastolic parameters are consistent with Grade I diastolic dysfunction (impaired relaxation).  2. Right ventricular systolic function is normal. The right ventricular size is normal.  3. The mitral valve is normal in structure. No evidence of mitral valve regurgitation. No evidence of mitral stenosis. Moderate mitral annular calcification.  4. The aortic valve is tricuspid. Aortic valve regurgitation is trivial. No aortic stenosis is present.  5. The inferior vena cava is normal in size with  greater than 50% respiratory variability, suggesting right atrial pressure of 3 mmHg. FINDINGS  Left Ventricle: Left ventricular ejection fraction, by estimation, is 55 to 60%. The left ventricle has normal function. The left ventricle has no regional wall motion abnormalities. The left ventricular internal cavity size was normal in size. There is  mild left ventricular hypertrophy of the basal-septal segment. Left ventricular diastolic parameters are consistent with Grade I diastolic dysfunction (impaired relaxation). Right Ventricle: The right ventricular size is normal.Right ventricular systolic function is normal. Left Atrium: Left atrial size was normal in size. Right Atrium: Right  atrial size was normal in size. Pericardium: There is no evidence of pericardial effusion. Mitral Valve: The mitral valve is normal in structure. Moderate mitral annular calcification. No evidence of mitral valve regurgitation. No evidence of mitral valve stenosis. Tricuspid Valve: The tricuspid valve is normal in structure. Tricuspid valve regurgitation is trivial. No evidence of tricuspid stenosis. Aortic Valve: The aortic valve is tricuspid. Aortic valve regurgitation is trivial. No aortic stenosis is present. Pulmonic Valve: The pulmonic valve was normal in structure. Pulmonic valve regurgitation is not visualized. No evidence of pulmonic stenosis. Aorta: The aortic root is normal in size and structure. Venous: The inferior vena cava is normal in size with greater than 50% respiratory variability, suggesting right atrial pressure of 3 mmHg. IAS/Shunts: No atrial level shunt detected by color flow Doppler.  LEFT VENTRICLE PLAX 2D LVIDd:         4.10 cm  Diastology LVIDs:         3.10 cm  LV e' medial:    3.87 cm/s LV PW:         0.90 cm  LV E/e' medial:  13.7 LV IVS:        0.70 cm  LV e' lateral:   8.51 cm/s LVOT diam:     1.60 cm  LV E/e' lateral: 6.2 LV SV:         23 LV SV Index:   16 LVOT Area:     2.01 cm  RIGHT VENTRICLE  RV S prime:     11.50 cm/s TAPSE (M-mode): 1.8 cm LEFT ATRIUM             Index       RIGHT ATRIUM          Index LA diam:        2.30 cm 1.56 cm/m  RA Area:     5.26 cm LA Vol (A2C):   14.6 ml 9.93 ml/m  RA Volume:   7.85 ml  5.34 ml/m LA Vol (A4C):   14.6 ml 9.93 ml/m LA Biplane Vol: 14.9 ml 10.13 ml/m  AORTIC VALVE LVOT Vmax:   68.00 cm/s LVOT Vmean:  49.600 cm/s LVOT VTI:    0.115 m  AORTA Ao Root diam: 2.60 cm MITRAL VALVE MV Area (PHT): 3.11 cm    SHUNTS MV Decel Time: 244 msec    Systemic VTI:  0.12 m MV E velocity: 53.00 cm/s  Systemic Diam: 1.60 cm MV A velocity: 93.90 cm/s MV E/A ratio:  0.56 Olga Millers MD Electronically signed by Olga Millers MD Signature Date/Time: 07/10/2020/1:56:09 PM    Final         Scheduled Meds:  amLODipine  10 mg Oral Daily   aspirin EC  81 mg Oral Daily   atorvastatin  20 mg Oral Daily   clopidogrel  75 mg Oral Daily   enoxaparin (LOVENOX) injection  30 mg Subcutaneous Q24H   insulin aspart  0-9 Units Subcutaneous TID WC   Continuous Infusions:   LOS: 3 days    Time spent: 35 min    Burke Keels, MD [facility]  If 7PM-7AM, please contact night-coverage  07/12/2020, 11:23 AM

## 2020-07-12 NOTE — Progress Notes (Signed)
SLP Cancellation Note  Patient Details Name: William Acevedo MRN: [id number] DOB: Apr 19, 1955   Cancelled treatment:       Reason Eval/Treat Not Completed: Medical issues which prohibited therapy; nursing deferred; pt in fetal position and given pain meds prior to SLE attempt. ST will continue efforts as able.   Tressie Stalker, M.S., CCC-SLP 07/12/2020, 1:16 PM

## 2020-07-12 NOTE — Progress Notes (Signed)
The NT went into room 2 to check Pt's BG, the NT came out and told me Pt had a white pill in his hand. I went in and asked Pt what pill he has taken he denied  haven taken any pill, I asked Pt was pills he has in the room, he denied having any pill in the room. He became aggressive and defensive

## 2020-07-13 ENCOUNTER — Other Ambulatory Visit: Payer: Self-pay

## 2020-07-13 ENCOUNTER — Ambulatory Visit (HOSPITAL_COMMUNITY)
Admission: EM | Admit: 2020-07-13 | Discharge: 2020-07-13 | Disposition: A | Discharge status: Home / Self Care | Payer: Medicare Other | Attending: Urology | Admitting: Urology

## 2020-07-13 DIAGNOSIS — F1410 Cocaine abuse, uncomplicated: Secondary | ICD-10-CM

## 2020-07-13 DIAGNOSIS — F1420 Cocaine dependence, uncomplicated: Secondary | ICD-10-CM

## 2020-07-13 DIAGNOSIS — F1910 Other psychoactive substance abuse, uncomplicated: Secondary | ICD-10-CM

## 2020-07-13 DIAGNOSIS — F1490 Cocaine use, unspecified, uncomplicated: Secondary | ICD-10-CM | POA: Insufficient documentation

## 2020-07-13 DIAGNOSIS — R112 Nausea with vomiting, unspecified: Secondary | ICD-10-CM

## 2020-07-13 DIAGNOSIS — R109 Unspecified abdominal pain: Secondary | ICD-10-CM | POA: Diagnosis not present

## 2020-07-13 LAB — GLUCOSE, CAPILLARY
Glucose-Capillary: 175 mg/dL — ABNORMAL HIGH (ref 70–99)
Glucose-Capillary: 158 mg/dL — ABNORMAL HIGH (ref 70–99)

## 2020-07-13 LAB — CBC WITH DIFFERENTIAL/PLATELET
Abs Immature Granulocytes: 0.01 10*3/uL (ref 0.00–0.07)
Basophils Absolute: 0 10*3/uL (ref 0.0–0.1)
Basophils Relative: 0 %
Eosinophils Absolute: 0 10*3/uL (ref 0.0–0.5)
Eosinophils Relative: 0 %
HCT: 31.8 % — ABNORMAL LOW (ref 39.0–52.0)
Hemoglobin: 10.7 g/dL — ABNORMAL LOW (ref 13.0–17.0)
Immature Granulocytes: 0 %
Lymphocytes Relative: 37 %
Lymphs Abs: 1.8 10*3/uL (ref 0.7–4.0)
MCH: 29.2 pg (ref 26.0–34.0)
MCHC: 33.6 g/dL (ref 30.0–36.0)
MCV: 86.9 fL (ref 80.0–100.0)
Monocytes Absolute: 0.4 10*3/uL (ref 0.1–1.0)
Monocytes Relative: 8 %
Neutro Abs: 2.6 10*3/uL (ref 1.7–7.7)
Neutrophils Relative %: 55 %
Platelets: 236 10*3/uL (ref 150–400)
RBC: 3.66 MIL/uL — ABNORMAL LOW (ref 4.22–5.81)
RDW: 12.5 % (ref 11.5–15.5)
WBC: 4.9 10*3/uL (ref 4.0–10.5)
nRBC: 0 % (ref 0.0–0.2)

## 2020-07-13 LAB — BASIC METABOLIC PANEL
Anion gap: 5 (ref 5–15)
BUN: 10 mg/dL (ref 8–23)
CO2: 29 mmol/L (ref 22–32)
Calcium: 8.6 mg/dL — ABNORMAL LOW (ref 8.9–10.3)
Chloride: 102 mmol/L (ref 98–111)
Creatinine, Ser: 0.73 mg/dL (ref 0.61–1.24)
GFR, Estimated: >60 mL/min (ref >60)
Glucose, Bld: 178 mg/dL — ABNORMAL HIGH (ref 70–99)
Potassium: 3.8 mmol/L (ref 3.5–5.1)
Sodium: 136 mmol/L (ref 135–145)

## 2020-07-13 LAB — LACTIC ACID, PLASMA: Lactic Acid, Venous: 0.8 mmol/L (ref 0.5–1.9)

## 2020-07-13 NOTE — Evaluation (Signed)
Speech Language Pathology Evaluation Patient Details Name: William Acevedo MRN: [id number] DOB: 12/14/1955 Today's Date: 07/13/2020 Time: 0930-1003 SLP Time Calculation (min) (ACUTE ONLY): 33 min  Problem List:  Patient Active Problem List   Diagnosis Date Noted   CVA (cerebral vascular accident) (HCC) 07/09/2020   Acute ischemic multifocal multiple vascular territories stroke (HCC) 06/06/2020   Seizure-like activity (HCC) 06/06/2020   Cocaine abuse (HCC)    ARF (acute renal failure) (HCC) 04/14/2020   Acute blood loss anemia 04/14/2020   GI bleed 04/14/2020   Nausea & vomiting 04/14/2020   Acute metabolic encephalopathy 04/14/2020   Hypertensive urgency 04/14/2020   Abdominal pain 04/02/2020   Protein-calorie malnutrition, severe 03/27/2020   Cerebral thrombosis with cerebral infarction 03/26/2020   NSTEMI (non-ST elevated myocardial infarction) (HCC) 03/25/2020   Secondary diabetes mellitus with HHNC (hyperglycemia hyperosmolar non-ketotic coma) (HCC) 03/25/2020   Elevated troponin    Renal infarction (HCC)    Cerebrovascular accident (CVA) (HCC)    Cocaine use disorder, severe, dependence (HCC) 04/08/2019   Major depressive disorder 04/08/2019   Epiglottitis 04/07/2019   Cocaine use 04/07/2019   Acute encephalopathy 04/07/2019   Overdose 07/06/2015   AKI (acute kidney injury) (HCC) 07/06/2015   Polysubstance abuse (HCC) 07/06/2015   DM2 (diabetes mellitus, type 2) (HCC) 07/06/2015   Dehydration    MDD (major depressive disorder), recurrent episode, mild (HCC)    Uncomplicated alcohol dependence (HCC)    Past Medical History:  Past Medical History:  Diagnosis Date   Carpal tunnel syndrome    Depression    Diabetes mellitus without complication (HCC)    Hypertensive urgency 04/14/2020   Neuropathy    Past Surgical History:  Past Surgical History:  Procedure Laterality Date   BUBBLE STUDY  03/30/2020   Procedure: BUBBLE STUDY;  Surgeon: Jake Bathe, MD;   Location: [facility] ENDOSCOPY;  Service: Cardiovascular;;   TEE WITHOUT CARDIOVERSION N/A 03/30/2020   Procedure: TRANSESOPHAGEAL ECHOCARDIOGRAM (TEE);  Surgeon: Jake Bathe, MD;  Location: [facility] ENDOSCOPY;  Service: Cardiovascular;  Laterality: N/A;   TESTICLE TORSION REDUCTION     HPI:  65 y.o. male with medical history significant of CVA, HTN, DM, cocaine abuse. Presenting with confusion. History is from his brother. He reports that the patient was in his normal state of health yesterday morning. He left the patient in his house and went to run errands. When he returned, he found his house in complete disarray. The patient seemed confused while sitting in the middle of the floor and staring off into space. He eventually rolled up into a ball on the floor and vomited. He continued to be nauseous throughout the night. His brother became concerned and brought him to the ED.07/10/20 MRI head indicated New acute ischemic nonhemorrhagic left frontal lobe infarct as  above.  2. Continued interval evolution of multifocal subacute ischemic  infarcts involving the right greater than left cerebral hemispheres  and right cerebellum. Scattered areas of associated petechial  hemorrhage without frank hemorrhagic transformation or significant  mass effect.  3. Underlying age-related cerebral atrophy with moderate chronic  small vessel ischemic disease.; SLE generated to assess pt's cognition.   Assessment / Plan / Recommendation Clinical Impression  Pt was assessed via the SLUMS ([employer] [place] [employer] Mental Status Examination) with a score obtained of 7/30 with deficits noted within attention, memory, language, organization, auditory comprehension and verbal expression.  He had difficulty recalling objects after a time delay, organizing thoughts d/t decreased overall processing of information and exhibited  min awareness stating "It's not fine, I can't remember anything."  Pt was oriented x2 (self, place) and was able to speak  with 100% intelligibility within simple conversation.  Verbal expression impacted by cognitive deficits and auditory comprehension paragraph retention task yielded 50% accuracy overall. Recommend ST f/u while in acute setting for cognitive/speech and language deficits.  Thank you for this consult.    SLP Assessment  SLP Recommendation/Assessment: Patient needs continued Speech Lanaguage Pathology Services SLP Visit Diagnosis: Cognitive communication deficit (R41.841);Attention and concentration deficit Attention and concentration deficit following: Cerebral infarction    Follow Up Recommendations  Other (comment) (TBD)    Frequency and Duration min 2x/week  1 week      SLP Evaluation Cognition  Overall Cognitive Status: Impaired/Different from baseline Arousal/Alertness: Awake/alert Orientation Level: Oriented to person;Oriented to place;Disoriented to situation;Disoriented to time Attention: Sustained Sustained Attention: Impaired Sustained Attention Impairment: Verbal basic;Functional basic Memory: Impaired Memory Impairment: Retrieval deficit;Decreased recall of new information;Decreased short term memory Decreased Short Term Memory: Verbal basic;Functional basic Immediate Memory Recall: Sock;Blue;Bed Memory Recall Sock: With Cue Memory Recall Blue: With Cue Memory Recall Bed: Not able to recall Awareness: Impaired Problem Solving: Impaired Problem Solving Impairment: Verbal basic;Functional basic Executive Function: Landscape architect: Impaired Organizing Impairment: Verbal basic;Functional basic Behaviors: Perseveration;Verbal agitation       Comprehension  Auditory Comprehension Overall Auditory Comprehension: Impaired Commands: Impaired Multistep Basic Commands: 25-49% accurate Conversation: Simple Interfering Components: Attention;Processing speed;Working Radio broadcast assistant: Sports administrator: Not tested Reading Comprehension Reading Status: Not tested    Expression Expression Primary Mode of Expression: Verbal Verbal Expression Overall Verbal Expression: Impaired Level of Generative/Spontaneous Verbalization: Phrase Naming: Impairment Responsive: 26-50% accurate Confrontation: Impaired Convergent: 25-49% accurate Divergent: 25-49% accurate Verbal Errors: Perseveration;Confabulation Non-Verbal Means of Communication: Not applicable Written Expression Dominant Hand: Left Written Expression: Not tested   Oral / Motor  Oral Motor/Sensory Function Overall Oral Motor/Sensory Function: Generalized oral weakness Motor Speech Respiration: Within functional limits Phonation: Normal Resonance: Within functional limits Articulation: Within functional limitis Intelligibility: Intelligible Motor Planning: Not tested Motor Speech Errors: Not applicable                       Tressie Stalker, M.S., CCC-SLP 07/13/2020, 5:17 PM

## 2020-07-13 NOTE — TOC Transition Note (Signed)
Transition of Care [facility]) - CM/SW Discharge Note   Patient Details  Name: William Acevedo MRN: [id number] Date of Birth: 1955-09-07  [facility]) CM/SW Contact:  Baldemar Lenis, LCSW Phone Number: 07/13/2020, 3:49 PM   Clinical Narrative:   CSW spoke with patient's brother to update him that patient is not service connected and does not qualify for placement through the [place], also that the patient is doing too well and we cannot place him from the hospital, he will have to return home. CSW discussed with Fayrene Fearing about contacting patient's Medicaid to work on placement through them, if he is interested in pursuing placement for the patient. Fayrene Fearing asked about why the patient couldn't go, because he had his sister placed from the hospital, and CSW explained that there's no medical necessity for placement at this time, the patient is doing really well ambulating and with ADLs. Brother asked for assistance, and CSW sent PCS Request through the patient's Medicaid to [place]. CSW attempted to find home health for the patient, but there was no company that would agree to take the patient due to his substance use. CSW explained to patient's brother that home health was going to be unlikely due to substance use, and CSW would update if anyone agreed to take the referral. No other needs identified at this time. Brother to pick up the patient after 5:00 pm.    Final next level of care: Home/Self Care Barriers to Discharge: Barriers Resolved   Patient Goals and CMS Choice   CMS Medicare.gov Compare Post Acute Care list provided to:: Patient Represenative (must comment) Choice offered to / list presented to : Sibling  Discharge Placement                Patient to be transferred to facility by: Family Name of family member notified: Fayrene Fearing Patient and family notified of of transfer: 07/13/20  Discharge Plan and Services     Post Acute Care Choice: Nursing Home                                Social Determinants of Health (SDOH) Interventions     Readmission Risk Interventions Readmission Risk Prevention Plan 04/19/2020  Transportation Screening Complete  Medication Review Oceanographer) Complete  PCP or Specialist appointment within 3-5 days of discharge Complete  HRI or Home Care Consult Complete  SW Recovery Care/Counseling Consult Complete  Palliative Care Screening Not Applicable  Skilled Nursing Facility Not Applicable  Some recent data might be hidden

## 2020-07-13 NOTE — Progress Notes (Signed)
Physical Therapy Treatment Patient Details Name: William Acevedo MRN: [id number] DOB: May 26, 1955 Today's Date: 07/13/2020    History of Present Illness Pt is a 65 y/o male presents [facility] 6/23 with confusion, nausea/vomiting. CTH shows evolution of the strokes known about from May as well as a new left frontal subcortical hypodensity most consistent with stroke. PMH includes: HTN, HLD, CVA, uncontrolled DM II, renal infarct, major depressive disorder, polysubstance abuse (cocaine and marijuana), multiple recent admissions for abdominal pain, shingles, and multifocal acute infarcts.    PT Comments    Pt progressing towards physical therapy goals. Was able to perform transfers and ambulation with gross supervision for safety to mod I by end of session. Pt progressed to no AD without notable unsteadiness or LOB. Cognition limiting session however overall pt was agreeable. Will continue to follow.    Follow Up Recommendations  No PT follow up;Supervision - Intermittent     Equipment Recommendations  None recommended by PT    Recommendations for Other Services       Precautions / Restrictions Precautions Precautions: Fall Restrictions Weight Bearing Restrictions: No    Mobility  Bed Mobility Overal bed mobility: Modified Independent             General bed mobility comments: Mod I for increased time and bedrail use    Transfers Overall transfer level: Needs assistance Equipment used: None Transfers: Sit to/from Stand Sit to Stand: Supervision         General transfer comment: for safety, no physical assist provided  Ambulation/Gait Ambulation/Gait assistance: Min guard;Supervision Gait Distance (Feet): 350 Feet Assistive device: None Gait Pattern/deviations: Step-through pattern;Decreased stride length;Drifts right/left Gait velocity: Decreased Gait velocity interpretation: 1.31 - 2.62 ft/sec, indicative of limited community ambulator General Gait Details:  Initially with IV pole for support and reporting he does not want to let go of it due to balance. No unsteadiness noted, and encouraged pt to attempt ambulation without IV pole. He was able to progress to a supervision level.   Stairs Stairs: Yes Stairs assistance: Supervision Stair Management: Two rails;Alternating pattern;Forwards   General stair comments: pt reliant on rail but no physical assist needed up or down   Wheelchair Mobility    Modified Rankin (Stroke Patients Only) Modified Rankin (Stroke Patients Only) Pre-Morbid Rankin Score: No significant disability Modified Rankin: Slight disability     Balance Overall balance assessment: Needs assistance Sitting-balance support: No upper extremity supported;Feet supported Sitting balance-Leahy Scale: Good     Standing balance support: No upper extremity supported;During functional activity Standing balance-Leahy Scale: Good Standing balance comment: tolerates horizontal/vertical head turns, step over object, directional changes without LOB                            Cognition Arousal/Alertness: Awake/alert Behavior During Therapy: Flat affect Overall Cognitive Status: Impaired/Different from baseline Area of Impairment: Following commands;Safety/judgement;Problem solving                       Following Commands: Follows one step commands with increased time Safety/Judgement: Decreased awareness of deficits;Decreased awareness of safety   Problem Solving: Slow processing;Requires verbal cues;Requires tactile cues       Exercises      General Comments        Pertinent Vitals/Pain Pain Assessment: Faces Faces Pain Scale: No hurt Pain Location: Pt did not complain of pain throughout assist. Pain Intervention(s): Monitored during session;Repositioned  Home Living                      Prior Function            PT Goals (current goals can now be found in the care plan  section) Acute Rehab PT Goals Patient Stated Goal: none stated PT Goal Formulation: With patient Time For Goal Achievement: 07/24/20 Potential to Achieve Goals: Good Progress towards PT goals: Progressing toward goals    Frequency    Min 4X/week      PT Plan Discharge plan needs to be updated    Co-evaluation              AM-PAC PT "6 Clicks" Mobility   Outcome Measure  Help needed turning from your back to your side while in a flat bed without using bedrails?: None Help needed moving from lying on your back to sitting on the side of a flat bed without using bedrails?: None Help needed moving to and from a bed to a chair (including a wheelchair)?: None Help needed standing up from a chair using your arms (e.g., wheelchair or bedside chair)?: None Help needed to walk in hospital room?: A Little Help needed climbing 3-5 steps with a railing? : A Little 6 Click Score: 22    End of Session Equipment Utilized During Treatment: Gait belt Activity Tolerance: Patient tolerated treatment well Patient left: in chair;with call bell/phone within reach;with chair alarm set Nurse Communication: Mobility status PT Visit Diagnosis: Unsteadiness on feet (R26.81);Difficulty in walking, not elsewhere classified (R26.2)     Time: 4270-6237 PT Time Calculation (min) (ACUTE ONLY): 23 min  Charges:  $Gait Training: 23-37 mins                     William Acevedo, PT, DPT Acute Rehabilitation Services Pager: [phone] Office: [phone]    William Acevedo 07/13/2020, 12:43 PM

## 2020-07-13 NOTE — Progress Notes (Addendum)
07/13/20 2003  [facility] Triage Screening (Walk-ins at [facility] only)  How Did You Hear About [place]? Family/Friend  What Is the Reason for Your Visit/Call Today? Pt says he came today because his brother insisted he needs help. Pt says he does not know why his brother is concerned. Pt says he does not have mental health problems but he does have medical problems and is scheduled to follow up with a doctor.  How Long Has This Been Causing You Problems? 1 wk - 1 month  Have You Recently Had Any Thoughts About Hurting Yourself? No  Are You Planning to Commit Suicide/Harm Yourself At This time? No  Have you Recently Had Thoughts About Hurting Someone Karolee Ohs? No  Are You Planning To Harm Someone At This Time? No  Are you currently experiencing any auditory, visual or other hallucinations? No  Have You Used Any Alcohol or Drugs in the Past 24 Hours? No  Do you have any current medical co-morbidities that require immediate attention? Yes  Please describe current medical co-morbidities that require immediate attention: Pt discharged today from a medical unit.  Clinician description of patient physical appearance/behavior: Pt is wearing hospital scrubs and a dirty hoodie. He appears annoyed that he is at [facility] and is being asked question.  What Do You Feel Would Help You the Most Today?  (Pt says he does not need mental health or substance abuse treatment.)  If access to [facility] was not available, would you have sought care in the Emergency Department? Yes  Determination of Need Routine (7 days)  Options For Referral Medication Management;Outpatient Therapy    Pt is a 65 year old male who presents unaccompanied to [facility]. He states his brother brought him here because brother is concerned about him. Pt says he does not know why his brother is concerned. Pt denies depressive symptoms. He denies current suicidal ideation. Pt denies any history of intentional self-injurious behaviors. Pt denies current homicidal  ideation or history of violence. Pt denies auditory or visual hallucinations. Pt denies history of alcohol or other substance use, however Pt's medical record indicates a history of substance use.  Pt would not give permission to contact his brother, Tellis Spivak, for collateral information. Pt's brother petitioned for IVC on 07/07/2020 and Pt was evaluated by TTS and psychiatrically cleared.  Pt's medical record indicates he was admitted to [facility] on 07/09/2020 and discharged today. Note from [facility] Blenda [place], [place] today: Clinical Narrative:   CSW spoke with patient's brother to update him that patient is not service connected and does not qualify for placement through the [place], also that the patient is doing too well and we cannot place him from the hospital, he will have to return home. CSW discussed with Fayrene Fearing about contacting patient's Medicaid to work on placement through them, if he is interested in pursuing placement for the patient. Fayrene Fearing asked about why the patient couldn't go, because he had his sister placed from the hospital, and CSW explained that there's no medical necessity for placement at this time, the patient is doing really well ambulating and with ADLs. Brother asked for assistance, and CSW sent PCS Request through the patient's Medicaid to [place]. CSW attempted to find home health for the patient, but there was no company that would agree to take the patient due to his substance use. CSW explained to patient's brother that home health was going to be unlikely due to substance use, and CSW would update if anyone  agreed to take the referral. No other needs identified at this time. Brother to pick up the patient after 5:00 pm.

## 2020-07-13 NOTE — Discharge Summary (Signed)
Physician Discharge Summary  William Acevedo BTD:[id number] DOB: 06/16/1955 DOA: 07/09/2020  PCP: Pcp, No  Admit date: 07/09/2020 Discharge date: 07/13/2020  Admitted From: Inpatient Disposition: home  Recommendations for Outpatient Follow-up:  Follow up with PCP in 1-2 weeks Please obtain BMP/CBC in one week Please follow up on the following pending results:  Home Health:No Equipment/Devices:no new equip  Discharge Condition:Stable CODE STATUS:Full code Diet recommendation: Diabetic diet  Brief/Interim Summary: William Acevedo is a 65 y.o. male with medical history significant of CVA, HTN, DM, cocaine abuse. Presenting with confusion. History is from his brother. He reports that the patient was in his normal state of health yesterday morning. He left the patient in his house and went to run errands. When he returned, he found his house in complete disarray. The patient seemed confused while sitting in the middle of the floor and staring off into space. He eventually rolled up into a ball on the floor and vomited. He continued to be nauseous throughout the night. His brother became concerned and brought him to the ED     Discharge Diagnoses:  Principal Problem:   CVA (cerebral vascular accident) [facility]) Active Problems:   Polysubstance abuse ([place])   Cocaine use disorder, severe, dependence ([place])   Abdominal pain   Nausea & vomiting Acute CVA Hx of CVA Confusion     - admitted to inpt, tele at [facility]     - c/w ASA PLAVIX for 3 weeks then aspirin alone from neuro perspective, although will continue plavix 2/2 pt hx of CAD/NSTEMI, defer to his PCP/Cards team at the [facility] to determine length of treatment     -continue statin     - neuro consulted-appreciate their input, amb f/up     - PT/OT/SLP pending-pt requests SNF/STR, although pt did not qqualify and has elected to be d/c to home with brother     - lipid panel LDL 98, outpt provider to monitor and intensift his ldl regimen  after pt modifies diet     - A1c 8.8     - echo without acute findings      N/V and abdominal pain  - pt CTA/P negative, pt was asleep each time I visitwed for reports of abd pain -he was hydrated and sx are absent   Hx of cocaine abuse     - pt with mul;tiple prior pos opiods and thc on UDS, did not give sample on this admission   DM2     - SSI, DM diet. glucose checks whiel in the hospital    - resume home meds on d/c,    HTN -c/w home meds on d/c discussed cessation of cocaine in setting of metoprolol, advised pt to discontinue metoprolol until he is clean and not using cocaine, he expressed understanding of the implication of using both which can include uncontrolled htn and possible cva or death   Elevated trp - mild, trend; no st elevations on EKG, no chest pain -As above   Normocytic anemia     - at baseline Hgb, no evidence of bleed, follow   Discharge Instructions  Discharge Instructions     Ambulatory referral to Neurology   Complete by: As directed    An appointment is requested in approximately: 2 weeks   Call MD for:  difficulty breathing, headache or visual disturbances   Complete by: As directed    Call MD for:  extreme fatigue   Complete by: As directed    Call MD  for:  persistant dizziness or light-headedness   Complete by: As directed    Call MD for:  persistant nausea and vomiting   Complete by: As directed    Call MD for:  temperature >100.4   Complete by: As directed    Diet - low sodium heart healthy   Complete by: As directed    Diet Carb Modified   Complete by: As directed    Increase activity slowly   Complete by: As directed       Allergies as of 07/13/2020       Reactions   Trazodone    Other reaction(s): Priapism Pt reports not allergic 06/07/20   Lisinopril Swelling   Angioedema Pt reports not allergic 06/07/20   Omeprazole    Other reaction(s): ANGIOEDEMA OF LIPS, Angioedema of tongue. Pt reports not allergic 06/07/20         Medication List     STOP taking these medications    sildenafil 25 MG tablet Commonly known as: VIAGRA       TAKE these medications    acetaminophen-codeine 300-15 MG tablet Commonly known as: TYLENOL #2 Take 1 tablet by mouth every 6 (six) hours as needed for moderate pain.   amitriptyline 10 MG tablet Commonly known as: ELAVIL Take 10 mg by mouth at bedtime.   amLODipine 10 MG tablet Commonly known as: NORVASC Take 10 mg by mouth daily.   aspirin 81 MG EC tablet Take 1 tablet (81 mg total) by mouth daily. Swallow whole.   atorvastatin 40 MG tablet Commonly known as: LIPITOR Take 1 tablet (40 mg total) by mouth at bedtime.   atorvastatin 20 MG tablet Commonly known as: LIPITOR Take 20 mg by mouth daily.   Belsomra 10 MG Tabs Generic drug: Suvorexant Take 10 mg by mouth at bedtime as needed (sleep).   blood glucose meter kit and supplies Kit Dispense based on patient and insurance preference. Use up to four times daily as directed. (FOR ICD-9 250.00, 250.01).   clopidogrel 75 MG tablet Commonly known as: PLAVIX Take 1 tablet (75 mg total) by mouth daily.   cyclobenzaprine 5 MG tablet Commonly known as: FLEXERIL Take 1 tablet (5 mg total) by mouth 3 (three) times daily as needed for muscle spasms.   dicyclomine 20 MG tablet Commonly known as: BENTYL Take 20 mg by mouth 3 (three) times daily.   famotidine 20 MG tablet Commonly known as: PEPCID Take 20 mg by mouth daily.   gabapentin 600 MG tablet Commonly known as: NEURONTIN Take 600 mg by mouth 2 (two) times daily.   glipiZIDE 10 MG 24 hr tablet Commonly known as: GLUCOTROL XL Take 10 mg by mouth daily. What changed: Another medication with the same name was removed. Continue taking this medication, and follow the directions you see here.   hydrALAZINE 50 MG tablet Commonly known as: APRESOLINE Take 1 tablet (50 mg total) by mouth every 8 (eight) hours.   HYDROcodone-acetaminophen 10-325 MG  tablet Commonly known as: NORCO Take 1 tablet by mouth every 6 (six) hours as needed.   HYDROmorphone 2 MG tablet Commonly known as: DILAUDID   insulin glargine 100 UNIT/ML Solostar Pen Commonly known as: LANTUS Inject 12 Units into the skin 2 (two) times daily.   insulin lispro 100 UNIT/ML KwikPen Commonly known as: HUMALOG Inject into the skin 3 (three) times daily.   insulin NPH-regular Human (70-30) 100 UNIT/ML injection   Insulin Pen Needle 29G X 12MM Misc Per instructions  Jardiance 25 MG Tabs tablet Generic drug: empagliflozin Take 25 mg by mouth every morning.   losartan 25 MG tablet Commonly known as: COZAAR Take 25 mg by mouth daily.   metoprolol tartrate 50 MG tablet Commonly known as: LOPRESSOR Take 50 mg by mouth 2 (two) times daily.   multivitamin with minerals Tabs tablet Take 1 tablet by mouth daily.   NovoLOG FlexPen 100 UNIT/ML FlexPen Generic drug: insulin aspart Inject 5 Units into the skin 3 (three) times daily with meals.   omeprazole 10 MG capsule Commonly known as: PRILOSEC Take 10 mg by mouth daily.   ondansetron 4 MG disintegrating tablet Commonly known as: Zofran ODT Take 1 tablet (4 mg total) by mouth every 8 (eight) hours as needed for nausea or vomiting.   pantoprazole 40 MG tablet Commonly known as: PROTONIX Take 40 mg by mouth 2 (two) times daily.   pregabalin 50 MG capsule Commonly known as: LYRICA Take 50 mg by mouth 2 (two) times daily.   sucralfate 1 g tablet Commonly known as: CARAFATE Take 1 g by mouth 2 (two) times daily.   tadalafil 10 MG tablet Commonly known as: CIALIS Take 20 mg by mouth daily as needed for erectile dysfunction.   valACYclovir 1000 MG tablet Commonly known as: VALTREX        Allergies  Allergen Reactions   Trazodone     Other reaction(s): Priapism Pt reports not allergic 06/07/20   Lisinopril Swelling    Angioedema Pt reports not allergic 06/07/20   Omeprazole     Other  reaction(s): ANGIOEDEMA OF LIPS, Angioedema of tongue. Pt reports not allergic 06/07/20    Consultations: Neuro, pt/ot   Procedures/Studies: CT ANGIO HEAD NECK W WO CM  Result Date: 07/10/2020 CLINICAL DATA:  Initial evaluation for neuro deficit, stroke. EXAM: CT ANGIOGRAPHY HEAD AND NECK TECHNIQUE: Multidetector CT imaging of the head and neck was performed using the standard protocol during bolus administration of intravenous contrast. Multiplanar CT image reconstructions and MIPs were obtained to evaluate the vascular anatomy. Carotid stenosis measurements (when applicable) are obtained utilizing NASCET criteria, using the distal internal carotid diameter as the denominator. CONTRAST:  11m OMNIPAQUE IOHEXOL 350 MG/ML SOLN COMPARISON:  Prior study from 03/27/2020. FINDINGS: CTA NECK FINDINGS Aortic arch: Visualized aortic arch normal caliber with normal branch pattern. Moderate atheromatous change about the arch and origin of the great vessels without high-grade stenosis. Right carotid system: Right CCA patent from its origin to the bifurcation without stenosis. Scattered plaque about the right carotid bulb without significant stenosis. Right ICA patent distally without stenosis, dissection or occlusion. Left carotid system: Left CCA patent from its origin to the bifurcation without stenosis. Eccentric calcified plaque about the left carotid bulb/proximal left ICA without significant stenosis. Left ICA patent distally without stenosis, dissection or occlusion. Vertebral arteries: Both vertebral arteries arise from the subclavian arteries. Left vertebral artery slightly dominant. There is a moderate 50% stenosis involving the proximal left subclavian artery prior to the takeoff of the left vertebral artery (series 7, image 301). Atheromatous change at the origins of both vertebral arteries with associated moderate stenosis on the right. Vertebral arteries patent distally without stenosis, dissection or  occlusion. Skeleton: No visible acute osseous finding. No discrete or worrisome osseous lesions. Other neck: No other acute soft tissue abnormality within the neck. No mass or adenopathy. Upper chest: Visualized upper chest demonstrates no acute finding. Paraseptal emphysema. Review of the MIP images confirms the above findings CTA HEAD FINDINGS Anterior circulation:  Petrous segments patent bilaterally. Scattered atheromatous change within the cavernous/supraclinoid segments of both internal carotid arteries. Associated approximate 50% stenosis at the proximal cavernous left ICA (series 7, image 118). Additional 50% stenosis about the anterior genu of the right ICA (series 7, image 111), stable from previous. A1 segments patent. Right A1 hypoplastic. Normal anterior communicating artery complex. Anterior cerebral arteries remain widely patent to their distal aspects. No M1 stenosis or occlusion. Normal MCA bifurcations. Distal MCA branches well perfused and symmetric. Posterior circulation: Both vertebral arteries patent to the vertebrobasilar junction without stenosis. Both PICA origins patent and normal. Basilar patent to its distal aspect without stenosis. Superior cerebral arteries patent bilaterally. Both PCAs remain widely patent and well perfused or distal aspects. Venous sinuses: Grossly patent allowing for timing the contrast bolus. Anatomic variants: Hypoplastic right A1 segment. Dominant left vertebral artery. No aneurysm. Review of the MIP images confirms the above findings IMPRESSION: 1. Stable CTA of the head and neck.  No large vessel occlusion. 2. Atheromatous change about the carotid siphons with associated moderate 50% stenoses bilaterally, stable. 3. Moderate 50% stenoses involving the proximal left subclavian artery prior to the takeoff of the left vertebral artery. 4. Emphysema (ICD10-J43.9). Electronically Signed   By: Jeannine Boga M.D.   On: 07/10/2020 04:46   CT Head Wo  Contrast  Result Date: 07/09/2020 CLINICAL DATA:  Altered mental status, recent infarction EXAM: CT HEAD WITHOUT CONTRAST TECHNIQUE: Contiguous axial images were obtained from the base of the skull through the vertex without intravenous contrast. COMPARISON:  CT and MR brain, 06/06/2020 FINDINGS: Brain: No evidence of hemorrhage, hydrocephalus, extra-axial collection or mass lesion/mass effect. There is multifocal bihemispheric hypodensity, which is generally in keeping with subacute evolution of acute diffusion restricting infarction seen on prior CT and MRI. There is at least one new region of hypodensity in the anterior left frontal lobe which does not correspond to a previously seen focus of acute diffusion restricting infarction (series 5, image 17). Vascular: No hyperdense vessel or unexpected calcification. Skull: Normal. Negative for fracture or focal lesion. Sinuses/Orbits: No acute finding. Other: None. IMPRESSION: 1. Multifocal bihemispheric hypodensity, which is generally in keeping with subacute evolution of acute diffusion restricting infarction seen on prior CT and MRI. There is at least one new region of hypodensity in the anterior left frontal lobe which does not correspond to a previously seen focus of acute diffusion restricting infarction. This constellation of findings suggests ongoing infarctions, perhaps from a central, embolic source. Consider repeat MRI to further evaluate. 2. No evidence of hemorrhage. Electronically Signed   By: Eddie Candle M.D.   On: 07/09/2020 14:37   MR BRAIN WO CONTRAST  Result Date: 07/10/2020 CLINICAL DATA:  Initial evaluation for neuro deficit, stroke suspected, confusion. EXAM: MRI HEAD WITHOUT CONTRAST TECHNIQUE: Multiplanar, multiecho pulse sequences of the brain and surrounding structures were obtained without intravenous contrast. COMPARISON:  Prior CT from 07/09/2020. FINDINGS: Brain: Examination moderately to severely degraded by motion artifact.  Generalized age-related cerebral atrophy. Patchy T2/FLAIR hyperintensity within the periventricular and deep white matter both cerebral hemispheres most consistent with chronic small vessel ischemic disease, moderate in nature. Few scattered remote lacunar infarcts noted about the bilateral basal ganglia and hemispheric cerebral white matter. Scattered remote bilateral cerebellar infarcts noted. There has been interval evolution of previously identified infarcts involving the right greater than left cerebral hemispheres and right cerebellum. Relatively mild residual diffusion abnormality seen about the larger areas of infarction. Few scattered foci of petechial hemorrhage without  frank hemorrhagic transformation or significant mass effect. New area of infarction involving the cortical and subcortical aspect of the left frontal lobe is seen, consistent with an acute ischemic infarct (series 20, image 84). Area measures up to approximately 2 cm in maximal diameter. No associated hemorrhage or mass effect. No other areas of acute ischemia identified. No acute intracranial hemorrhage. No mass lesion, mass effect, or midline shift. No hydrocephalus or extra-axial fluid collection. Vascular: Major intracranial vascular flow voids are maintained. Skull and upper cervical spine: Craniocervical junction grossly within normal limits. Bone marrow signal intensity grossly normal. No visible scalp soft tissue abnormality. Sinuses/Orbits: Globes and orbital soft tissues demonstrate no acute finding. Mild mucosal thickening noted within the ethmoidal air cells. Paranasal sinuses are otherwise clear. No mastoid effusion. Other: None. IMPRESSION: 1. New acute ischemic nonhemorrhagic left frontal lobe infarct as above. 2. Continued interval evolution of multifocal subacute ischemic infarcts involving the right greater than left cerebral hemispheres and right cerebellum. Scattered areas of associated petechial hemorrhage without frank  hemorrhagic transformation or significant mass effect. 3. Underlying age-related cerebral atrophy with moderate chronic small vessel ischemic disease. Electronically Signed   By: Jeannine Boga M.D.   On: 07/10/2020 04:09   CT ABDOMEN PELVIS W CONTRAST  Result Date: 07/11/2020 CLINICAL DATA:  Abdominal pain. EXAM: CT ABDOMEN AND PELVIS WITH CONTRAST TECHNIQUE: Multidetector CT imaging of the abdomen and pelvis was performed using the standard protocol following bolus administration of intravenous contrast. CONTRAST:  160m OMNIPAQUE IOHEXOL 300 MG/ML  SOLN COMPARISON:  CT chest, abdomen and pelvis, 06/06/2020. FINDINGS: Lower chest: Clear lung bases.  Heart normal in size. Hepatobiliary: No focal liver abnormality is seen. No gallstones, gallbladder wall thickening, or biliary dilatation. Pancreas: Unremarkable. No pancreatic ductal dilatation or surrounding inflammatory changes. Spleen: Normal in size without focal abnormality. Adrenals/Urinary Tract: No adrenal masses. Kidneys normal in size, orientation and position. 2 small low-density lesions in the right kidney, largest at the midpole, 11 mm, both consistent with cysts. Several small nonobstructing stones in the right kidney. Tiny low-density left renal lesions likely cysts. No left intrarenal stones. No hydronephrosis. Ureters not well visualized, without apparent dilation and no evidence of a stone. Normal bladder. Stomach/Bowel: Stomach is unremarkable. Small bowel and colon are normal in caliber. No wall thickening or convincing inflammation. Colonic stool burden moderately increased. Appendix not visualized. No evidence of appendicitis. Vascular/Lymphatic: Dense aortic atherosclerotic calcifications. No aneurysm. No enlarged lymph nodes. Reproductive: Enlarged prostate, 4.9 x 3.8 cm transversely. Other: Tiny bubbles of air are noted in the right mid to lower quadrant anterior abdominal wall musculature, of unclear etiology and significance,  possibly from an anterior abdominal wall injection. No abdominal wall hernia or abnormality. No abdominopelvic ascites. Musculoskeletal: No fracture or acute finding.  No bone lesion. IMPRESSION: 1. Small bubbles of air noted in the right mid to lower quadrant anterior abdominal wall musculature without associated mass, fluid or inflammation. This is nonspecific. Correlate with any history of an abdominal wall injection. 2. No other evidence of an acute abnormality within the abdomen or pelvis. 3. No bowel obstruction or inflammation. Moderate increase in the colonic stool burden. 4. Dense aortic atherosclerotic calcifications. Electronically Signed   By: DLajean ManesM.D.   On: 07/11/2020 15:32   DG Chest Port 1 View  Result Date: 07/09/2020 CLINICAL DATA:  Evaluate for pneumonia.  Failure to thrive. EXAM: PORTABLE CHEST 1 VIEW COMPARISON:  04/02/2020 FINDINGS: The heart size and mediastinal contours are within normal limits. Both lungs  are clear. The visualized skeletal structures are unremarkable. IMPRESSION: No active disease. Electronically Signed   By: Nolon Nations M.D.   On: 07/09/2020 13:42   ECHOCARDIOGRAM COMPLETE  Result Date: 07/10/2020    ECHOCARDIOGRAM REPORT   Patient Name:   CHALES PELISSIER Date of Exam: 07/10/2020 Medical Rec #:  [id number]           Height:       66.0 in Accession #:    [id number]          Weight:       97.0 lb Date of Birth:  August 15, 1955           BSA:          1.470 m Patient Age:    65 years            BP:           146/79 mmHg Patient Gender: M                   HR:           86 bpm. Exam Location:  Inpatient Procedure: 2D Echo, Cardiac Doppler and Color Doppler Indications:    Stroke I63.9  History:        Patient has prior history of Echocardiogram examinations, most                 recent 06/06/2020. Risk Factors:Current Smoker, Diabetes and                 Hypertension. Polysubstance abuse.  Sonographer:    Darlina Sicilian RDCS Referring Phys: [id number] [place]  1. Left ventricular ejection fraction, by estimation, is 55 to 60%. The left ventricle has normal function. The left ventricle has no regional wall motion abnormalities. There is mild left ventricular hypertrophy of the basal-septal segment. Left ventricular diastolic parameters are consistent with Grade I diastolic dysfunction (impaired relaxation).  2. Right ventricular systolic function is normal. The right ventricular size is normal.  3. The mitral valve is normal in structure. No evidence of mitral valve regurgitation. No evidence of mitral stenosis. Moderate mitral annular calcification.  4. The aortic valve is tricuspid. Aortic valve regurgitation is trivial. No aortic stenosis is present.  5. The inferior vena cava is normal in size with greater than 50% respiratory variability, suggesting right atrial pressure of 3 mmHg. FINDINGS  Left Ventricle: Left ventricular ejection fraction, by estimation, is 55 to 60%. The left ventricle has normal function. The left ventricle has no regional wall motion abnormalities. The left ventricular internal cavity size was normal in size. There is  mild left ventricular hypertrophy of the basal-septal segment. Left ventricular diastolic parameters are consistent with Grade I diastolic dysfunction (impaired relaxation). Right Ventricle: The right ventricular size is normal.Right ventricular systolic function is normal. Left Atrium: Left atrial size was normal in size. Right Atrium: Right atrial size was normal in size. Pericardium: There is no evidence of pericardial effusion. Mitral Valve: The mitral valve is normal in structure. Moderate mitral annular calcification. No evidence of mitral valve regurgitation. No evidence of mitral valve stenosis. Tricuspid Valve: The tricuspid valve is normal in structure. Tricuspid valve regurgitation is trivial. No evidence of tricuspid stenosis. Aortic Valve: The aortic valve is tricuspid. Aortic valve regurgitation is  trivial. No aortic stenosis is present. Pulmonic Valve: The pulmonic valve was normal in structure. Pulmonic valve regurgitation is not visualized. No evidence of pulmonic stenosis. Aorta: The aortic  root is normal in size and structure. Venous: The inferior vena cava is normal in size with greater than 50% respiratory variability, suggesting right atrial pressure of 3 mmHg. IAS/Shunts: No atrial level shunt detected by color flow Doppler.  LEFT VENTRICLE PLAX 2D LVIDd:         4.10 cm  Diastology LVIDs:         3.10 cm  LV e' medial:    3.87 cm/s LV PW:         0.90 cm  LV E/e' medial:  13.7 LV IVS:        0.70 cm  LV e' lateral:   8.51 cm/s LVOT diam:     1.60 cm  LV E/e' lateral: 6.2 LV SV:         23 LV SV Index:   16 LVOT Area:     2.01 cm  RIGHT VENTRICLE RV S prime:     11.50 cm/s TAPSE (M-mode): 1.8 cm LEFT ATRIUM             Index       RIGHT ATRIUM          Index LA diam:        2.30 cm 1.56 cm/m  RA Area:     5.26 cm LA Vol (A2C):   14.6 ml 9.93 ml/m  RA Volume:   7.85 ml  5.34 ml/m LA Vol (A4C):   14.6 ml 9.93 ml/m LA Biplane Vol: 14.9 ml 10.13 ml/m  AORTIC VALVE LVOT Vmax:   68.00 cm/s LVOT Vmean:  49.600 cm/s LVOT VTI:    0.115 m  AORTA Ao Root diam: 2.60 cm MITRAL VALVE MV Area (PHT): 3.11 cm    SHUNTS MV Decel Time: 244 msec    Systemic VTI:  0.12 m MV E velocity: 53.00 cm/s  Systemic Diam: 1.60 cm MV A velocity: 93.90 cm/s MV E/A ratio:  0.56 Kirk Ruths MD Electronically signed by Kirk Ruths MD Signature Date/Time: 07/10/2020/1:56:09 PM    Final       Subjective: Overnight was observed to be taking pills in his room, pt became combative with staff when confronted  Discharge Exam: Vitals:   07/13/20 0731 07/13/20 1109  BP: 107/62 (!) 144/75  Pulse: 86 74  Resp: 18 16  Temp: 98.1 F (36.7 C) 97.7 F (36.5 C)  SpO2: 100% 100%   Vitals:   07/12/20 2353 07/13/20 0348 07/13/20 0731 07/13/20 1109  BP: 136/75 132/79 107/62 (!) 144/75  Pulse: 82 80 86 74  Resp: 17 17 18  16   Temp: 98.1 F (36.7 C) 98 F (36.7 C) 98.1 F (36.7 C) 97.7 F (36.5 C)  TempSrc: Oral Oral Oral Oral  SpO2: 100% 100% 100% 100%  Weight:      Height:        General: Pt is alert, awake, not in acute distress Cardiovascular: RRR, S1/S2 +, no rubs, no gallops Respiratory: CTA bilaterally, no wheezing, no rhonchi Abdominal: Soft, NT, ND, bowel sounds + Extremities: no edema, no cyanosis    The results of significant diagnostics from this hospitalization (including imaging, microbiology, ancillary and laboratory) are listed below for reference.     Microbiology: Recent Results (from the past 240 hour(s))  SARS CORONAVIRUS 2 (TAT 6-24 HRS) Nasopharyngeal Nasopharyngeal Swab     Status: None   Collection Time: 07/09/20  4:35 PM   Specimen: Nasopharyngeal Swab  Result Value Ref Range Status   SARS Coronavirus 2 NEGATIVE NEGATIVE Final  Comment: (NOTE) SARS-CoV-2 target nucleic acids are NOT DETECTED.  The SARS-CoV-2 RNA is generally detectable in upper and lower respiratory specimens during the acute phase of infection. Negative results do not preclude SARS-CoV-2 infection, do not rule out co-infections with other pathogens, and should not be used as the sole basis for treatment or other patient management decisions. Negative results must be combined with clinical observations, patient history, and epidemiological information. The expected result is Negative.  Fact Sheet for Patients: SugarRoll.be  Fact Sheet for Healthcare Providers: https://www.woods-mathews.com/  This test is not yet approved or cleared by the [place] FDA and  has been authorized for detection and/or diagnosis of SARS-CoV-2 by FDA under an Emergency Use Authorization (EUA). This EUA will remain  in effect (meaning this test can be used) for the duration of the COVID-19 declaration under Se ction 564(b)(1) of the Act, 21 U.S.C. section  360bbb-3(b)(1), unless the authorization is terminated or revoked sooner.  Performed at [facility], [place] [address], [place], [place] [address]      Labs: BNP (last 3 results) No results for input(s): BNP in the last 8760 hours. Basic Metabolic Panel: Recent Labs  Lab 07/09/20 1029 07/11/20 1018 07/12/20 0238 07/13/20 0344  NA 137 134* 132* 136  K 3.6 3.2* 3.4* 3.8  CL 95* 93* 97* 102  CO2 28 29 28 29   GLUCOSE 174* 107* 158* 178*  BUN 16 13 10 10   CREATININE 1.14 1.16 0.84 0.73  CALCIUM 9.1 9.2 8.5* 8.6*   Liver Function Tests: No results for input(s): AST, ALT, ALKPHOS, BILITOT, PROT, ALBUMIN in the last 168 hours. No results for input(s): LIPASE, AMYLASE in the last 168 hours. No results for input(s): AMMONIA in the last 168 hours. CBC: Recent Labs  Lab 07/09/20 1029 07/11/20 1018 07/12/20 0238 07/13/20 0344  WBC 5.7 5.6 4.7 4.9  NEUTROABS  --  2.9 2.0 2.6  HGB 12.4* 11.6* 9.7* 10.7*  HCT 38.3* 35.1* 28.7* 31.8*  MCV 89.3 86.5 85.9 86.9  PLT 308 282 231 236   Cardiac Enzymes: No results for input(s): CKTOTAL, CKMB, CKMBINDEX, TROPONINI in the last 168 hours. BNP: Invalid input(s): POCBNP CBG: Recent Labs  Lab 07/12/20 1125 07/12/20 1608 07/12/20 2126 07/13/20 0617 07/13/20 1108  GLUCAP 181* 139* 96 175* 158*   D-Dimer No results for input(s): DDIMER in the last 72 hours. Hgb A1c No results for input(s): HGBA1C in the last 72 hours. Lipid Profile No results for input(s): CHOL, HDL, LDLCALC, TRIG, CHOLHDL, LDLDIRECT in the last 72 hours. Thyroid function studies No results for input(s): TSH, T4TOTAL, T3FREE, THYROIDAB in the last 72 hours.  Invalid input(s): FREET3 Anemia work up No results for input(s): VITAMINB12, FOLATE, FERRITIN, TIBC, IRON, RETICCTPCT in the last 72 hours. Urinalysis    Component Value Date/Time   COLORURINE YELLOW 07/11/2020 0322   APPEARANCEUR CLEAR 07/11/2020 0322   LABSPEC 1.012 07/11/2020 0322   PHURINE 7.0  07/11/2020 0322   GLUCOSEU >=500 (A) 07/11/2020 0322   HGBUR NEGATIVE 07/11/2020 0322   BILIRUBINUR NEGATIVE 07/11/2020 0322   KETONESUR 5 (A) 07/11/2020 0322   PROTEINUR 30 (A) 07/11/2020 0322   NITRITE NEGATIVE 07/11/2020 0322   LEUKOCYTESUR NEGATIVE 07/11/2020 0322   Sepsis Labs Invalid input(s): PROCALCITONIN,  WBC,  LACTICIDVEN Microbiology Recent Results (from the past 240 hour(s))  SARS CORONAVIRUS 2 (TAT 6-24 HRS) Nasopharyngeal Nasopharyngeal Swab     Status: None   Collection Time: 07/09/20  4:35 PM   Specimen: Nasopharyngeal Swab  Result Value Ref Range Status   SARS Coronavirus 2 NEGATIVE NEGATIVE Final    Comment: (NOTE) SARS-CoV-2 target nucleic acids are NOT DETECTED.  The SARS-CoV-2 RNA is generally detectable in upper and lower respiratory specimens during the acute phase of infection. Negative results do not preclude SARS-CoV-2 infection, do not rule out co-infections with other pathogens, and should not be used as the sole basis for treatment or other patient management decisions. Negative results must be combined with clinical observations, patient history, and epidemiological information. The expected result is Negative.  Fact Sheet for Patients: SugarRoll.be  Fact Sheet for Healthcare Providers: https://www.woods-mathews.com/  This test is not yet approved or cleared by the [place] FDA and  has been authorized for detection and/or diagnosis of SARS-CoV-2 by FDA under an Emergency Use Authorization (EUA). This EUA will remain  in effect (meaning this test can be used) for the duration of the COVID-19 declaration under Se ction 564(b)(1) of the Act, 21 U.S.C. section 360bbb-3(b)(1), unless the authorization is terminated or revoked sooner.  Performed at [facility], [place] [address], [place], [place] [address]      Time coordinating discharge: Over 30 minutes  SIGNED:   Nicolette Bang,  MD  [facility] 07/13/2020, 12:29 PM Pager   If 7PM-7AM, please contact night-coverage [email or web address] Password [email or web address]

## 2020-07-13 NOTE — ED Notes (Signed)
Pt discharged in no acute distress. A&O x4, ambulatory. Denied SI/HI/AVH. Verbalized understanding of AVS instructions reviewed by RN. Pt had no belongings in lockers. Pt escorted to front lobby by staff. Safety maintained.

## 2020-07-13 NOTE — ED Provider Notes (Signed)
[facility] Medical Screening Exam  Patient Name: William Acevedo MRN: [id number] Date of Evaluation: 07/13/20 Chief Complaint:   Diagnosis:  Final diagnoses:  Cocaine use disorder (HCC)  Polysubstance abuse (HCC)    History of Present illness: William Acevedo is a 65 y.o. male with a psychiatric history of cocaine abuse.  Patient was discharged from the hospital today 07/13/2020 and presented voluntarily to [facility] unaccompanied. Patient reports that his brother dropped him off at [facility] immediately after picking him up from the hospital. He reports that his brother believes that he needs help with cocaine abuse. Patient reports that he does not need any psychiatric help and denies using cocaine or any other illegal substance although his UDS was noted to be positive for cocaine on 07/11/2020. He express that he is frustrated being discharged from the hospital today. He report that was not ready for discharge and that his doctor in the hospital did not notify him ahead of time that he will be discharged today. When asked about what concerns he felt was not addressed by doctors in the hospital, patient report that he recently had a stroke that affected his speech and that he wanted to have more speech therapy sessions while admitted to the hospital. He denied any other medical or new psychiatric complaint.   Patient contracted for safety. He denied history of self-harming, access to firearm/weapons, or suicidal attempt. He denied SI, HI, AVH, paranoia, and no delusional thought content noted. He denied alcohol and illicit drug abuse (UDS on 07/11/20 was positive for cocaine).   He reports that he lives at home with his brother and refuse consent for TTS or this Clinical research associate to contact this brother for collateral information.  On assessment patient is alert, oriented, irritated but cooperative. Patient noted with expressive aphasia and slur/garbled speech due to CVA. Patient denies  depressive symptoms/mood and anxiety. He continues to deny SI/HI/AVH/psychiotic symptoms. He denies chest pain, AMS, SOB, acute distress, fever, GI/GU symptoms.    Psychiatric Specialty Exam  Presentation  General Appearance:Fairly Groomed  Eye Contact:Good  Speech:Slurred (expressive aphasia)  Speech Volume:Normal  Handedness:Right   Mood and Affect  Mood:Anxious  Affect:Congruent   Thought Process  Thought Processes:Coherent  Descriptions of Associations:Intact  Orientation:Full (Time, Place and Person)  Thought Content:Logical  Diagnosis of Schizophrenia or Schizoaffective disorder in past: No   Hallucinations:None  Ideas of Reference:None  Suicidal Thoughts:No  Homicidal Thoughts:No   Sensorium  Memory:Immediate Good; Recent Good; Remote Fair  Judgment:Fair  Insight:Fair   Executive Functions  Concentration:Good  Attention Span:Good  Recall:Good  Fund of Knowledge:Good  Language: No data recorded  Psychomotor Activity  Psychomotor Activity:Normal   Assets  Assets:Desire for Improvement; Financial Resources/Insurance; Housing; Social Support   Sleep  Sleep:Good  Number of hours: 8   No data recorded  Physical Exam: Physical Exam Vitals and nursing note reviewed.  Constitutional:      General: He is not in acute distress.    Appearance: He is well-developed. He is not ill-appearing, toxic-appearing or diaphoretic.  HENT:     Head: Normocephalic and atraumatic.  Eyes:     Conjunctiva/sclera: Conjunctivae normal.  Cardiovascular:     Rate and Rhythm: Normal rate.  Pulmonary:     Effort: Pulmonary effort is normal. No respiratory distress.     Breath sounds: Normal breath sounds.  Abdominal:     Palpations: Abdomen is soft.     Tenderness: There is no abdominal tenderness.  Musculoskeletal:  Cervical back: Normal range of motion.  Skin:    General: Skin is warm and dry.  Neurological:     Mental Status: He is alert.    Review of Systems  Constitutional: Negative.   HENT: Negative.    Eyes: Negative.   Respiratory:  Negative for cough, shortness of breath and wheezing.   Cardiovascular: Negative.  Negative for chest pain and palpitations.  Gastrointestinal: Negative.   Genitourinary: Negative.   Skin: Negative.   Neurological:  Positive for speech change (expressive aphasia) and weakness. Negative for dizziness, tingling, tremors and headaches.  Psychiatric/Behavioral:  Positive for substance abuse. Negative for depression, hallucinations and suicidal ideas. The patient is not nervous/anxious and does not have insomnia.   Blood pressure (!) 156/88, pulse 99, temperature 98.5 F (36.9 C), temperature source Oral, resp. rate 18, SpO2 100 %. There is no height or weight on file to calculate BMI.  Musculoskeletal: Strength & Muscle Tone: decreased Gait & Station: unsteady Patient leans: Right   [facility] MSE Discharge Disposition for Follow up and Recommendations: Based on my evaluation the patient does not appear to have an emergency medical condition and can be discharged with resources and follow up care in outpatient services  -patient provided with outpatient substance abuse resources.   Maricela Bo, NP 07/13/2020, 9:37 PM

## 2020-07-13 NOTE — Discharge Instructions (Addendum)
  Discharge recommendations:  Patient is to take medications as prescribed. Please see information for follow-up appointment with psychiatry and therapy. Please follow up with your primary care provider for all medical related needs.   Therapy: We recommend that patient participate in individual therapy to address mental health concerns.  Medications: The parent/guardian is to contact a medical professional and/or outpatient provider to address any new side effects that develop. Parent/guardian should update outpatient providers of any new medications and/or medication changes.  Safety:  The patient should abstain from use of illicit substances/drugs and abuse of any medications. If symptoms worsen or do not continue to improve or if the patient becomes actively suicidal or homicidal then it is recommended that the patient return to the closest hospital emergency department, the [facility], or call 911 for further evaluation and treatment. National Suicide Prevention Lifeline 1-800-SUICIDE or (332)129-6421.    Discussed with the patient and all questioned fully answered.

## 2020-07-15 ENCOUNTER — Telehealth (HOSPITAL_COMMUNITY): Payer: Self-pay

## 2020-07-15 NOTE — BH Assessment (Signed)
Care Management - Follow Up [facility] Discharges   Writer attempted to make contact with patient today and was unsuccessful.  Writer was able to leave a HIPPA compliant voice message and will await callback.   

## 2020-08-11 ENCOUNTER — Emergency Department (HOSPITAL_COMMUNITY)
Admission: EM | Admit: 2020-08-11 | Discharge: 2020-08-11 | Discharge status: Against Medical Advice | Payer: No Typology Code available for payment source | Attending: Emergency Medicine | Admitting: Emergency Medicine

## 2020-08-11 DIAGNOSIS — R21 Rash and other nonspecific skin eruption: Secondary | ICD-10-CM | POA: Diagnosis not present

## 2020-08-11 DIAGNOSIS — Z5321 Procedure and treatment not carried out due to patient leaving prior to being seen by health care provider: Secondary | ICD-10-CM | POA: Insufficient documentation

## 2020-08-11 DIAGNOSIS — K5900 Constipation, unspecified: Secondary | ICD-10-CM | POA: Diagnosis not present

## 2020-08-11 NOTE — ED Notes (Signed)
Pt stated he did not want to sign MSE, needed to use bathroom emergently due to suppository use

## 2020-08-11 NOTE — ED Triage Notes (Signed)
Pt by [facility] for c/o constipation; states unable to go "for several days." Tried to go today, had to "pull it." EMS unable to note bowel sounds due to pt's discomfort & "wiggling." Active shingles rash noted on face/head 180/90 90 98% RA

## 2020-08-11 NOTE — ED Notes (Signed)
Pt called multiple times, no answer 

## 2020-08-28 ENCOUNTER — Encounter (HOSPITAL_COMMUNITY): Payer: Self-pay | Admitting: *Deleted

## 2020-08-28 ENCOUNTER — Inpatient Hospital Stay (HOSPITAL_COMMUNITY)
Admission: EM | Admit: 2020-08-28 | Discharge: 2020-08-29 | DRG: 281 | Discharge status: Against Medical Advice | Payer: No Typology Code available for payment source | Attending: Family Medicine | Admitting: Family Medicine

## 2020-08-28 ENCOUNTER — Emergency Department (HOSPITAL_COMMUNITY): Payer: No Typology Code available for payment source

## 2020-08-28 DIAGNOSIS — R1084 Generalized abdominal pain: Secondary | ICD-10-CM | POA: Diagnosis present

## 2020-08-28 DIAGNOSIS — I214 Non-ST elevation (NSTEMI) myocardial infarction: Principal | ICD-10-CM | POA: Diagnosis present

## 2020-08-28 DIAGNOSIS — Z888 Allergy status to other drugs, medicaments and biological substances status: Secondary | ICD-10-CM

## 2020-08-28 DIAGNOSIS — E1100 Type 2 diabetes mellitus with hyperosmolarity without nonketotic hyperglycemic-hyperosmolar coma (NKHHC): Secondary | ICD-10-CM | POA: Diagnosis not present

## 2020-08-28 DIAGNOSIS — Z7984 Long term (current) use of oral hypoglycemic drugs: Secondary | ICD-10-CM | POA: Diagnosis not present

## 2020-08-28 DIAGNOSIS — Z794 Long term (current) use of insulin: Secondary | ICD-10-CM | POA: Diagnosis not present

## 2020-08-28 DIAGNOSIS — N179 Acute kidney failure, unspecified: Secondary | ICD-10-CM | POA: Diagnosis present

## 2020-08-28 DIAGNOSIS — R112 Nausea with vomiting, unspecified: Secondary | ICD-10-CM | POA: Diagnosis not present

## 2020-08-28 DIAGNOSIS — E119 Type 2 diabetes mellitus without complications: Secondary | ICD-10-CM

## 2020-08-28 DIAGNOSIS — Z7902 Long term (current) use of antithrombotics/antiplatelets: Secondary | ICD-10-CM | POA: Diagnosis not present

## 2020-08-28 DIAGNOSIS — B029 Zoster without complications: Secondary | ICD-10-CM | POA: Diagnosis present

## 2020-08-28 DIAGNOSIS — I10 Essential (primary) hypertension: Secondary | ICD-10-CM | POA: Diagnosis present

## 2020-08-28 DIAGNOSIS — F17210 Nicotine dependence, cigarettes, uncomplicated: Secondary | ICD-10-CM | POA: Diagnosis present

## 2020-08-28 DIAGNOSIS — F419 Anxiety disorder, unspecified: Secondary | ICD-10-CM | POA: Diagnosis present

## 2020-08-28 DIAGNOSIS — Z8673 Personal history of transient ischemic attack (TIA), and cerebral infarction without residual deficits: Secondary | ICD-10-CM | POA: Diagnosis not present

## 2020-08-28 DIAGNOSIS — E1165 Type 2 diabetes mellitus with hyperglycemia: Secondary | ICD-10-CM | POA: Diagnosis present

## 2020-08-28 DIAGNOSIS — Z7982 Long term (current) use of aspirin: Secondary | ICD-10-CM | POA: Diagnosis not present

## 2020-08-28 DIAGNOSIS — I160 Hypertensive urgency: Secondary | ICD-10-CM | POA: Diagnosis present

## 2020-08-28 DIAGNOSIS — Z79891 Long term (current) use of opiate analgesic: Secondary | ICD-10-CM

## 2020-08-28 DIAGNOSIS — F1410 Cocaine abuse, uncomplicated: Secondary | ICD-10-CM | POA: Diagnosis present

## 2020-08-28 DIAGNOSIS — Z5321 Procedure and treatment not carried out due to patient leaving prior to being seen by health care provider: Secondary | ICD-10-CM | POA: Diagnosis not present

## 2020-08-28 DIAGNOSIS — Z79899 Other long term (current) drug therapy: Secondary | ICD-10-CM

## 2020-08-28 DIAGNOSIS — F1020 Alcohol dependence, uncomplicated: Secondary | ICD-10-CM | POA: Diagnosis present

## 2020-08-28 DIAGNOSIS — Z20822 Contact with and (suspected) exposure to covid-19: Secondary | ICD-10-CM | POA: Diagnosis present

## 2020-08-28 DIAGNOSIS — R778 Other specified abnormalities of plasma proteins: Secondary | ICD-10-CM | POA: Diagnosis present

## 2020-08-28 DIAGNOSIS — R197 Diarrhea, unspecified: Secondary | ICD-10-CM | POA: Diagnosis not present

## 2020-08-28 DIAGNOSIS — I248 Other forms of acute ischemic heart disease: Secondary | ICD-10-CM | POA: Diagnosis not present

## 2020-08-28 DIAGNOSIS — R7989 Other specified abnormal findings of blood chemistry: Secondary | ICD-10-CM | POA: Diagnosis present

## 2020-08-28 DIAGNOSIS — F1090 Alcohol use, unspecified, uncomplicated: Secondary | ICD-10-CM | POA: Diagnosis present

## 2020-08-28 DIAGNOSIS — R739 Hyperglycemia, unspecified: Secondary | ICD-10-CM

## 2020-08-28 DIAGNOSIS — E1140 Type 2 diabetes mellitus with diabetic neuropathy, unspecified: Secondary | ICD-10-CM | POA: Diagnosis present

## 2020-08-28 DIAGNOSIS — F32A Depression, unspecified: Secondary | ICD-10-CM | POA: Diagnosis present

## 2020-08-28 DIAGNOSIS — G8929 Other chronic pain: Secondary | ICD-10-CM | POA: Diagnosis present

## 2020-08-28 LAB — CBC WITH DIFFERENTIAL/PLATELET
Abs Immature Granulocytes: 0.03 10*3/uL (ref 0.00–0.07)
Basophils Absolute: 0 10*3/uL (ref 0.0–0.1)
Basophils Relative: 0 %
Eosinophils Absolute: 0 10*3/uL (ref 0.0–0.5)
Eosinophils Relative: 0 %
HCT: 37.4 % — ABNORMAL LOW (ref 39.0–52.0)
Hemoglobin: 12.4 g/dL — ABNORMAL LOW (ref 13.0–17.0)
Immature Granulocytes: 0 %
Lymphocytes Relative: 37 %
Lymphs Abs: 2.7 10*3/uL (ref 0.7–4.0)
MCH: 29.2 pg (ref 26.0–34.0)
MCHC: 33.2 g/dL (ref 30.0–36.0)
MCV: 88.2 fL (ref 80.0–100.0)
Monocytes Absolute: 0.7 10*3/uL (ref 0.1–1.0)
Monocytes Relative: 10 %
Neutro Abs: 3.7 10*3/uL (ref 1.7–7.7)
Neutrophils Relative %: 53 %
Platelets: 287 10*3/uL (ref 150–400)
RBC: 4.24 MIL/uL (ref 4.22–5.81)
RDW: 13.2 % (ref 11.5–15.5)
WBC: 7.1 10*3/uL (ref 4.0–10.5)
nRBC: 0 % (ref 0.0–0.2)

## 2020-08-28 LAB — COMPREHENSIVE METABOLIC PANEL
ALT: 15 U/L (ref 0–44)
AST: 17 U/L (ref 15–41)
Albumin: 4.2 g/dL (ref 3.5–5.0)
Alkaline Phosphatase: 64 U/L (ref 38–126)
Anion gap: 13 (ref 5–15)
BUN: 33 mg/dL — ABNORMAL HIGH (ref 8–23)
CO2: 24 mmol/L (ref 22–32)
Calcium: 9.8 mg/dL (ref 8.9–10.3)
Chloride: 100 mmol/L (ref 98–111)
Creatinine, Ser: 1.66 mg/dL — ABNORMAL HIGH (ref 0.61–1.24)
GFR, Estimated: 45 mL/min — ABNORMAL LOW (ref >60)
Glucose, Bld: 437 mg/dL — ABNORMAL HIGH (ref 70–99)
Potassium: 5.1 mmol/L (ref 3.5–5.1)
Sodium: 137 mmol/L (ref 135–145)
Total Bilirubin: 0.6 mg/dL (ref 0.3–1.2)
Total Protein: 7.5 g/dL (ref 6.5–8.1)

## 2020-08-28 LAB — RAPID URINE DRUG SCREEN, HOSP PERFORMED
Amphetamines: NOT DETECTED
Barbiturates: NOT DETECTED
Benzodiazepines: NOT DETECTED
Cocaine: NOT DETECTED
Opiates: POSITIVE — AB
Tetrahydrocannabinol: NOT DETECTED

## 2020-08-28 LAB — LIPASE, BLOOD: Lipase: 24 U/L (ref 11–51)

## 2020-08-28 LAB — URINALYSIS, ROUTINE W REFLEX MICROSCOPIC
Bacteria, UA: NONE SEEN
Bilirubin Urine: NEGATIVE
Glucose, UA: >=500 mg/dL — AB
Hgb urine dipstick: NEGATIVE
Ketones, ur: NEGATIVE mg/dL
Leukocytes,Ua: NEGATIVE
Nitrite: NEGATIVE
Protein, ur: 100 mg/dL — AB
Specific Gravity, Urine: 1.039 — ABNORMAL HIGH (ref 1.005–1.030)
pH: 5 (ref 5.0–8.0)

## 2020-08-28 LAB — TROPONIN I (HIGH SENSITIVITY)
Troponin I (High Sensitivity): 206 ng/L (ref <18)
Troponin I (High Sensitivity): 225 ng/L (ref <18)
Troponin I (High Sensitivity): 215 ng/L (ref <18)

## 2020-08-28 LAB — RESP PANEL BY RT-PCR (FLU A&B, COVID) ARPGX2
Influenza A by PCR: NEGATIVE
Influenza B by PCR: NEGATIVE
SARS Coronavirus 2 by RT PCR: NEGATIVE

## 2020-08-28 LAB — CBG MONITORING, ED
Glucose-Capillary: 166 mg/dL — ABNORMAL HIGH (ref 70–99)
Glucose-Capillary: 90 mg/dL (ref 70–99)

## 2020-08-28 MED ORDER — ONDANSETRON HCL 4 MG/2ML IJ SOLN
4.0000 mg | Freq: Once | INTRAMUSCULAR | Status: AC
  Administered 2020-08-28: 4 mg via INTRAVENOUS
  Filled 2020-08-28: qty 2

## 2020-08-28 MED ORDER — HEPARIN BOLUS VIA INFUSION
2500.0000 [IU] | Freq: Once | INTRAVENOUS | Status: AC
  Administered 2020-08-28: 2500 [IU] via INTRAVENOUS
  Filled 2020-08-28: qty 2500

## 2020-08-28 MED ORDER — HEPARIN (PORCINE) 25000 UT/250ML-% IV SOLN
750.0000 [IU]/h | INTRAVENOUS | Status: DC
  Administered 2020-08-28: 550 [IU]/h via INTRAVENOUS
  Filled 2020-08-28: qty 250

## 2020-08-28 MED ORDER — SODIUM CHLORIDE 0.45 % IV SOLN: INTRAVENOUS | Status: DC

## 2020-08-28 MED ORDER — ACETAMINOPHEN 325 MG PO TABS
650.0000 mg | ORAL_TABLET | ORAL | Status: DC | PRN
  Administered 2020-08-29: 650 mg via ORAL
  Filled 2020-08-28 (×2): qty 2

## 2020-08-28 MED ORDER — ONDANSETRON HCL 4 MG/2ML IJ SOLN: 4.0000 mg | Freq: Four times a day (QID) | INTRAMUSCULAR | Status: DC | PRN

## 2020-08-28 MED ORDER — HYDROMORPHONE HCL 1 MG/ML IJ SOLN
1.0000 mg | Freq: Once | INTRAMUSCULAR | Status: AC
  Administered 2020-08-28: 1 mg via INTRAVENOUS
  Filled 2020-08-28: qty 1

## 2020-08-28 MED ORDER — INSULIN ASPART 100 UNIT/ML IJ SOLN
0.0000 [IU] | Freq: Three times a day (TID) | INTRAMUSCULAR | Status: DC
  Administered 2020-08-29: 11 [IU] via SUBCUTANEOUS
  Filled 2020-08-28: qty 0.2

## 2020-08-28 MED ORDER — INSULIN ASPART 100 UNIT/ML IJ SOLN
0.0000 [IU] | Freq: Every day | INTRAMUSCULAR | Status: DC
  Administered 2020-08-28: 0 [IU] via SUBCUTANEOUS
  Filled 2020-08-28: qty 0.05

## 2020-08-28 MED ORDER — ASPIRIN 325 MG PO TABS
325.0000 mg | ORAL_TABLET | Freq: Once | ORAL | Status: AC
  Administered 2020-08-28: 325 mg via ORAL
  Filled 2020-08-28: qty 1

## 2020-08-28 MED ORDER — SODIUM CHLORIDE 0.9 % IV BOLUS
1000.0000 mL | Freq: Once | INTRAVENOUS | Status: AC
  Administered 2020-08-28: 1000 mL via INTRAVENOUS

## 2020-08-28 MED ORDER — INSULIN ASPART 100 UNIT/ML IJ SOLN
5.0000 [IU] | Freq: Once | INTRAMUSCULAR | Status: AC
  Administered 2020-08-28: 5 [IU] via SUBCUTANEOUS
  Filled 2020-08-28: qty 0.05

## 2020-08-28 MED ORDER — IOHEXOL 350 MG/ML SOLN
75.0000 mL | Freq: Once | INTRAVENOUS | Status: AC | PRN
  Administered 2020-08-28: 75 mL via INTRAVENOUS

## 2020-08-28 NOTE — ED Provider Notes (Signed)
[facility] DEPT Provider Note   CSN: [id number] Arrival date & time: 08/28/20  1336     History Chief Complaint  Patient presents with   Abdominal Pain   Hyperglycemia    TYJON BOWEN is a 65 y.o. male with PMHx of CVA, polysubstance abuse, chronic mesenteric ischemia, T2DM who presents to [facility] ED for nausea/vomiting with abdominal pain. Patient states he has 1 week hx of new abdominal pain different from chronic abdominal pain that is RLQ pain, constant. He has also had nausea/vomiting with a few bouts of non-bloody emesis last night. Patient says it is normal for him to have nausea and vomiting. He has prescription for Zofran though states the IV Zofran works better for him. He has not had a BM in two days though states this is normal for him and he does not feel constipated. He thinks his new abdominal pain is similar to the R chest wall and back pain he has from shingles. He had shingles diagnosed in March and has been getting hydrocodone for pain. Last was prescribed 4m q6hr 30 tablets on 7/29 two weeks ago. He states he does not have any hydrocodone left. He is not following with chronic pain specialist. He denies fever, flu like symptoms, chest pain, SOB, cough, current nausea vomiting, diarrhea.    Abdominal Pain Associated symptoms: no chest pain, no constipation, no cough, no diarrhea, no fever, no nausea, no shortness of breath, no sore throat and no vomiting   Hyperglycemia Associated symptoms: abdominal pain   Associated symptoms: no chest pain, no fever, no nausea, no shortness of breath and no vomiting       Past Medical History:  Diagnosis Date   Carpal tunnel syndrome    Depression    Diabetes mellitus without complication ([facility]    Hypertensive urgency 04/14/2020   Neuropathy     Patient Active Problem List   Diagnosis Date Noted   CVA (cerebral vascular accident) ([facility] 07/09/2020   Acute ischemic multifocal multiple vascular  territories stroke ([facility] 06/06/2020   Seizure-like activity ([facility] [place] 06/06/2020   Cocaine abuse ([facility]    ARF (acute renal failure) ([facility] 04/14/2020   Acute blood loss anemia 04/14/2020   GI bleed 04/14/2020   Nausea & vomiting 041/96/2229  Acute metabolic encephalopathy 079/89/2119  Hypertensive urgency 04/14/2020   Abdominal pain 04/02/2020   Protein-calorie malnutrition, severe 03/27/2020   Cerebral thrombosis with cerebral infarction 03/26/2020   NSTEMI (non-ST elevated myocardial infarction) ([facility] 03/25/2020   Secondary diabetes mellitus with HHNC (hyperglycemia hyperosmolar non-ketotic coma) ([facility] 03/25/2020   Elevated troponin    Renal infarction ([facility]    Cerebrovascular accident (CVA) ([facility]    Cocaine use disorder, severe, dependence ([facility] 04/08/2019   Major depressive disorder 04/08/2019   Epiglottitis 04/07/2019   Cocaine use 04/07/2019   Acute encephalopathy 04/07/2019   Overdose 07/06/2015   AKI (acute kidney injury) ([facility] 07/06/2015   Polysubstance abuse ([facility] 07/06/2015   DM2 (diabetes mellitus, type 2) ([facility] 07/06/2015   Dehydration    MDD (major depressive disorder), recurrent episode, mild (HCC)    Uncomplicated alcohol dependence ([facility]     Past Surgical History:  Procedure Laterality Date   BUBBLE STUDY  03/30/2020   Procedure: BUBBLE STUDY;  Surgeon: SJerline Pain MD;  [facility] [facility]  Service: Cardiovascular;;   TEE WITHOUT CARDIOVERSION N/A 03/30/2020   Procedure: TRANSESOPHAGEAL ECHOCARDIOGRAM (TEE);  Surgeon: SJerline Pain MD;  [facility];  Service: Cardiovascular;  Laterality:  N/A;   TESTICLE TORSION REDUCTION         No family history on file.  Social History   Tobacco Use   Smoking status: Every Day    Packs/day: 0.50    Types: Cigarettes   Smokeless tobacco: Never  Substance Use Topics   Alcohol use: Yes   Drug use: Yes    Types: Cocaine    Home Medications Prior to Admission medications   Medication Sig Start  Date End Date Taking? Authorizing Provider  amitriptyline (ELAVIL) 10 MG tablet Take 10 mg by mouth at bedtime.   Yes [provider]  amLODipine (NORVASC) 10 MG tablet Take 10 mg by mouth daily. 06/26/20  Yes [provider]  aspirin EC 81 MG EC tablet Take 1 tablet (81 mg total) by mouth daily. Swallow whole. 06/09/20  Yes Little Ishikawa, MD  clopidogrel (PLAVIX) 75 MG tablet Take 1 tablet (75 mg total) by mouth daily. 06/09/20  Yes Little Ishikawa, MD  cyclobenzaprine (FLEXERIL) 5 MG tablet Take 1 tablet (5 mg total) by mouth 3 (three) times daily as needed for muscle spasms. 03/30/20  Yes Arrien, Jimmy Picket, MD  dicyclomine (BENTYL) 20 MG tablet Take 20 mg by mouth 3 (three) times daily. 07/02/20  Yes [provider]  docusate sodium (COLACE) 100 MG capsule Take 100 mg by mouth 2 (two) times daily.   Yes [provider]  famotidine (PEPCID) 20 MG tablet Take 20 mg by mouth daily.   Yes [provider]  gabapentin (NEURONTIN) 100 MG capsule Take 100 mg by mouth 3 (three) times daily.   Yes [provider]  glipiZIDE (GLUCOTROL) 10 MG tablet Take 10 mg by mouth daily before breakfast.   Yes [provider]  hydrALAZINE (APRESOLINE) 50 MG tablet Take 50 mg by mouth 3 (three) times daily.   Yes [provider]  HYDROcodone-acetaminophen (NORCO) 10-325 MG tablet Take 1 tablet by mouth every 6 (six) hours as needed for moderate pain or severe pain.   Yes [provider]  insulin glargine (LANTUS) 100 UNIT/ML Solostar Pen Inject 12 Units into the skin 2 (two) times daily. 06/09/20  Yes Little Ishikawa, MD  insulin lispro (HUMALOG) 100 UNIT/ML KwikPen Inject 0-30 Units into the skin as directed. INJECT SUBCUTANEOUSLY BASED ON BLOOD SUGAR RESULTS WITH MEALS THREE TIMES DAILY PRN FOR HIGH BLOOD SUGAR. MAX OF 30 UNITS IN 24 HOURS   Yes [provider]  metoprolol succinate (TOPROL-XL) 50 MG 24 hr tablet  Take 25 mg by mouth daily. Take with or immediately following a meal.   Yes [provider]  ondansetron (ZOFRAN) 8 MG tablet Take 8 mg by mouth every 8 (eight) hours as needed for nausea or vomiting.   Yes [provider]  pantoprazole (PROTONIX) 40 MG tablet Take 40 mg by mouth 2 (two) times daily. 06/29/20  Yes [provider]  pregabalin (LYRICA) 50 MG capsule Take 50 mg by mouth 2 (two) times daily. 05/12/20  Yes [provider]  rosuvastatin (CRESTOR) 40 MG tablet Take 40 mg by mouth daily.   Yes [provider]  Suvorexant (BELSOMRA) 10 MG TABS Take 10 mg by mouth at bedtime.   Yes [provider]  valACYclovir (VALTREX) 500 MG tablet Take 500 mg by mouth 2 (two) times daily. 08/12/20  Yes [provider]  atorvastatin (LIPITOR) 40 MG tablet Take 1 tablet (40 mg total) by mouth at bedtime. Patient not taking: No sig  reported 06/09/20   Little Ishikawa, MD  blood glucose meter kit and supplies KIT Dispense based on patient and insurance preference. Use up to four times daily as directed. (FOR ICD-9 250.00, 250.01). 09/13/19   Isla Pence, MD  hydrALAZINE (APRESOLINE) 50 MG tablet Take 1 tablet (50 mg total) by mouth every 8 (eight) hours. 04/19/20 07/18/20  Kayleen Memos, DO  insulin aspart (NOVOLOG FLEXPEN) 100 UNIT/ML FlexPen Inject 5 Units into the skin 3 (three) times daily with meals. Patient not taking: Reported on 08/28/2020 04/05/20   Oswald Hillock, MD  Insulin Pen Needle 29G X 12MM MISC Per instructions 04/08/19   [name], MD  ondansetron (ZOFRAN ODT) 4 MG disintegrating tablet Take 1 tablet (4 mg total) by mouth every 8 (eight) hours as needed for nausea or vomiting. Patient not taking: No sig reported 06/09/20   Little Ishikawa, MD    Allergies    Trazodone, Lisinopril, and Omeprazole  Review of Systems   Review of Systems  Constitutional:  Negative for fever.  HENT:  Negative for congestion, rhinorrhea  and sore throat.   Respiratory:  Negative for cough and shortness of breath.   Cardiovascular:  Negative for chest pain.       R Chest wall pain  Gastrointestinal:  Positive for abdominal pain. Negative for constipation, diarrhea, nausea and vomiting.  Musculoskeletal:  Positive for back pain.   Physical Exam Updated Vital Signs BP 131/81   Pulse 63   Temp 98.1 F (36.7 C) (Oral)   Resp 11   SpO2 100%   Physical Exam Constitutional:      Appearance: He is cachectic. He is not toxic-appearing.  HENT:     Head: Normocephalic and atraumatic.     Mouth/Throat:     Mouth: Mucous membranes are moist.     Pharynx: Oropharynx is clear.  Eyes:     Extraocular Movements: Extraocular movements intact.     Conjunctiva/sclera: Conjunctivae normal.  Cardiovascular:     Rate and Rhythm: Normal rate and regular rhythm.     Pulses: Normal pulses.     Heart sounds: No murmur heard. Pulmonary:     Effort: Pulmonary effort is normal. No respiratory distress.     Breath sounds: No wheezing, rhonchi or rales.  Chest:     Chest wall: Tenderness (R chest wall tenderness) present.  Abdominal:     General: Abdomen is flat. There is no distension.     Palpations: Abdomen is soft. There is no mass.     Tenderness: There is abdominal tenderness (RLQ). There is no guarding or rebound.  Musculoskeletal:        General: Tenderness (Back tenderness) present.  Skin:    General: Skin is warm and dry.     Findings: Rash (old scarring of 1-2cm lesions on R chest wall) present.  Neurological:     Mental Status: He is alert and oriented to person, place, and time. Mental status is at baseline.     Comments: Stuttering/word finding difficulty that is baseline for him since last stroke.  Psychiatric:        Mood and Affect: Mood normal.        Behavior: Behavior normal.    ED Results / Procedures / Treatments   Labs (all labs ordered are listed, but only abnormal results are displayed) Labs Reviewed   CBC WITH DIFFERENTIAL/PLATELET - Abnormal; Notable for the following components:      Result Value   Hemoglobin  12.4 (*)    HCT 37.4 (*)    All other components within normal limits  COMPREHENSIVE METABOLIC PANEL - Abnormal; Notable for the following components:   Glucose, Bld 437 (*)    BUN 33 (*)    Creatinine, Ser 1.66 (*)    GFR, Estimated 45 (*)    All other components within normal limits  CBG MONITORING, ED - Abnormal; Notable for the following components:   Glucose-Capillary 166 (*)    All other components within normal limits  TROPONIN I (HIGH SENSITIVITY) - Abnormal; Notable for the following components:   Troponin I (High Sensitivity) 206 (*)    All other components within normal limits  TROPONIN I (HIGH SENSITIVITY) - Abnormal; Notable for the following components:   Troponin I (High Sensitivity) 225 (*)    All other components within normal limits  RESP PANEL BY RT-PCR (FLU A&B, COVID) ARPGX2  LIPASE, BLOOD  URINALYSIS, ROUTINE W REFLEX MICROSCOPIC  HEPARIN LEVEL (UNFRACTIONATED)    EKG EKG Interpretation  Date/Time:  Friday August 28 2020 19:37:36 EDT Ventricular Rate:  61 PR Interval:  147 QRS Duration: 84 QT Interval:  434 QTC Calculation: 438 R Axis:   44 Text Interpretation: Sinus rhythm Consider right atrial enlargement Nonspecific T abnormalities, lateral leads No significant change since last tracing Confirmed by Wandra Arthurs [phone]) on 08/28/2020 8:03:16 PM  Radiology CT ABDOMEN PELVIS W CONTRAST  Result Date: 08/28/2020 CLINICAL DATA:  Abdominal distension.  Chronic abdominal pain. EXAM: CT ABDOMEN AND PELVIS WITH CONTRAST TECHNIQUE: Multidetector CT imaging of the abdomen and pelvis was performed using the standard protocol following bolus administration of intravenous contrast. CONTRAST:  73m OMNIPAQUE IOHEXOL 350 MG/ML SOLN COMPARISON:  07/11/2020 FINDINGS: Lower chest: Included lung bases are clear.  Heart size is normal. Hepatobiliary: No focal  liver abnormality is seen. No gallstones, gallbladder wall thickening, or biliary dilatation. Pancreas: Unremarkable. No pancreatic ductal dilatation or surrounding inflammatory changes. Spleen: Normal in size without focal abnormality. Adrenals/Urinary Tract: Unremarkable adrenal glands. Chronic areas of cortical scarring involving the right kidney. Stable small right renal cysts. Multiple punctate nonobstructing stones within the right kidney. No hydronephrosis either kidney. Urinary bladder appears unremarkable. Stomach/Bowel: Stomach is unremarkable. Dilated loops of bowel. Moderate-large volume of stool throughout the colon. No focal bowel wall thickening or inflammatory changes. Vascular/Lymphatic: Prominent atherosclerotic calcifications throughout the aortoiliac axis. No aneurysm. No abdominopelvic lymphadenopathy identified. Reproductive: Prostate is unremarkable. Other: No free fluid. No abdominopelvic fluid collection. No pneumoperitoneum. No abdominal wall hernia. Musculoskeletal: No acute or significant osseous findings. Degenerative facet arthropathy of the lower lumbar spine. IMPRESSION: 1. No acute abdominopelvic findings. 2. Moderate-large volume of stool throughout the colon. 3. Nonobstructing right nephrolithiasis. 4. Aortic atherosclerosis (ICD10-I70.0). Electronically Signed   By: NDavina PokeD.O.   On: 08/28/2020 18:43   DG Chest Port 1 View  Result Date: 08/28/2020 CLINICAL DATA:  Chest pain with nausea and vomiting for 2 days, initial encounter EXAM: PORTABLE CHEST 1 VIEW COMPARISON:  07/09/2020 FINDINGS: Cardiac shadow is within normal limits. Aortic calcifications are noted. The lungs are well aerated bilaterally. No focal infiltrate or effusion is seen. No bony abnormality is noted. IMPRESSION: No acute abnormality noted. Electronically Signed   By: MInez CatalinaM.D.   On: 08/28/2020 20:08    Procedures Procedures No procedures performed this visit  Medications Ordered in  ED Medications  heparin bolus via infusion 2,500 Units (has no administration in time range)  heparin ADULT infusion 100 units/mL (25000 units/2579m (  has no administration in time range)  sodium chloride 0.9 % bolus 1,000 mL (0 mLs Intravenous Stopped 08/28/20 1950)  ondansetron (ZOFRAN) injection 4 mg (4 mg Intravenous Given 08/28/20 1751)  HYDROmorphone (DILAUDID) injection 1 mg (1 mg Intravenous Given 08/28/20 1751)  insulin aspart (novoLOG) injection 5 Units (5 Units Subcutaneous Given 08/28/20 1750)  iohexol (OMNIPAQUE) 350 MG/ML injection 75 mL (75 mLs Intravenous Contrast Given 08/28/20 1825)  aspirin tablet 325 mg (325 mg Oral Given 08/28/20 2012)    ED Course  I have reviewed the triage vital signs and the nursing notes.  Pertinent labs & imaging results that were available during my care of the patient were reviewed by me and considered in my medical decision making (see chart for details).    MDM Rules/Calculators/A&P                         SLADEN PLANCARTE is a 65 y.o. male with PMHx of CVA, polysubstance abuse, chronic mesenteric ischemia, T2DM who presents to [facility] ED for nausea/vomiting with abdominal pain. Patient is afebrile and HDS on arrival to the ED. CBC without leukocytosis, CMP notable for blood glucose of 437 without anion gap, and BUN 33 with Cr 1.66 (baseline ~1.0). Lipase normal. Labwork not consistent with DKA. Will start 1L fluids and SQ inulin aspart, zofran for hyperglycemia. Patient's nausea/vomiting abdominal pain could be related to uncontrolled hyperglycemia or recent vomiting or shingles neuropathic pain. We ordered CT abdomen pelvis W to further evaluate which showed no acute abdominopelvic findings, mod-large stool volume. Patient given dilaudid 88m for pain. Troponins and EKG ordered since patient was having chest wall pain to rule out serious causes of chest pain. Troponin was 206, EKG NSR without ST elevations. Likely demand ischemia from recent cocaine use or  from AKI. UDS pending. Patient was discussed with hospitalist and cardiology and he will be admitted for further medical management of hyperglycemia, AKI and elevated troponins.  Final Clinical Impression(s) / ED Diagnoses Final diagnoses:  AKI (acute kidney injury) ([place]  Elevated troponin  Hyperglycemia    Rx / DC Orders ED Discharge Orders          Ordered    Rapid urine drug screen (hospital performed)        08/28/20 1733             ZWayland Denis MD 08/28/20 2104    YDrenda Freeze MD 08/28/20 2303

## 2020-08-28 NOTE — H&P (Signed)
History and Physical   William Acevedo SFS:[id number] DOB: 1955-02-14 DOA: 08/28/2020  Referring MD/NP/PA: Dr. Darl Householder  PCP: Pcp, No   Outpatient Specialists: None  Patient coming from: Home  Chief Complaint: Chest pain  HPI: William Acevedo is a 65 y.o. male with medical history significant of polysubstance abuse including cocaine and alcohol abuse, hypertension, diabetes, diabetic neuropathy, history of CVA who was here about 2 months ago.  At that point he had echocardiogram done.  Patient was brought in by EMS today complaining of nausea vomiting abdominal pain since last night.  Also chest pain.  He was found to have blood sugar of 436.  He does have history of chronic abdominal pain.  Initially was given Zofran normal saline and was being monitored.  CT chest and abdomen performed.  Abdominal exam showed no acute findings.  Patient was found to have elevated troponin as well as AKI.  Discussed case with cardiology.  At this point demand ischemia is suspected.  Patient with known history of cocaine abuse therefore at high risk for cocaine induced MI.  He is being admitted to the hospital for trending troponins and treatment..  ED Course: Temperature is 98.1 blood pressure 157/77, pulse 66 respiratory 19 oxygen sat 96% on room air.  White count 7.1 hemoglobin 12.4 and platelets 287.  Sodium 137 potassium 5.2 chloride 100 CO2 24 BUN 33 creatinine 1.67 calcium 9.8.  Initial troponin 206.  CT abdomen pelvis showed no acute findings.  Chest x-ray showed no acute findings.  EKG reviewed and patient being admitted for rule out MI  Review of Systems: As per HPI otherwise 10 point review of systems negative.    Past Medical History:  Diagnosis Date   Carpal tunnel syndrome    Depression    Diabetes mellitus without complication ([place])    Hypertensive urgency 04/14/2020   Neuropathy     Past Surgical History:  Procedure Laterality Date   BUBBLE STUDY  03/30/2020   Procedure: BUBBLE STUDY;   Surgeon: Jerline Pain, MD;  Location: [place];  Service: Cardiovascular;;   TEE WITHOUT CARDIOVERSION N/A 03/30/2020   Procedure: TRANSESOPHAGEAL ECHOCARDIOGRAM (TEE);  Surgeon: Jerline Pain, MD;  Location[facility] ENDOSCOPY;  Service: Cardiovascular;  Laterality: N/A;   TESTICLE TORSION REDUCTION       reports that he has been smoking cigarettes. He has been smoking an average of .5 packs per day. He has never used smokeless tobacco. He reports current alcohol use. He reports current drug use. Drug: Cocaine.  Allergies  Allergen Reactions   Trazodone     Other reaction(s): Priapism Pt reports not allergic 06/07/20   Lisinopril Swelling    Angioedema Pt reports not allergic 06/07/20   Omeprazole     Other reaction(s): ANGIOEDEMA OF LIPS, Angioedema of tongue. Pt reports not allergic 06/07/20    No family history on file.   Prior to Admission medications   Medication Sig Start Date End Date Taking? Authorizing Provider  acetaminophen-codeine (TYLENOL #2) 300-15 MG tablet Take 1 tablet by mouth every 6 (six) hours as needed for moderate pain.    [provider]  amitriptyline (ELAVIL) 10 MG tablet Take 10 mg by mouth at bedtime.    [provider]  amLODipine (NORVASC) 10 MG tablet Take 10 mg by mouth daily. 06/26/20   [provider]  aspirin EC 81 MG EC tablet Take 1 tablet (81 mg total) by mouth daily. Swallow whole. 06/09/20   Little Ishikawa, MD  atorvastatin (LIPITOR) 20 MG tablet Take 20 mg by mouth daily. 06/18/20   [provider]  atorvastatin (LIPITOR) 40 MG tablet Take 1 tablet (40 mg total) by mouth at bedtime. 06/09/20   Little Ishikawa, MD  blood glucose meter kit and supplies KIT Dispense based on patient and insurance preference. Use up to four times daily as directed. (FOR ICD-9 250.00, 250.01). 09/13/19   Isla Pence, MD  clopidogrel (PLAVIX) 75 MG tablet Take 1 tablet (75 mg total) by mouth daily. 06/09/20   Little Ishikawa, MD  cyclobenzaprine (FLEXERIL) 5 MG tablet Take 1 tablet (5 mg total) by mouth 3 (three) times daily as needed for muscle spasms. 03/30/20   Arrien, Jimmy Picket, MD  dicyclomine (BENTYL) 20 MG tablet Take 20 mg by mouth 3 (three) times daily. 07/02/20   [provider]  famotidine (PEPCID) 20 MG tablet Take 20 mg by mouth daily.    [provider]  gabapentin (NEURONTIN) 600 MG tablet Take 600 mg by mouth 2 (two) times daily. 06/09/20   [provider]  glipiZIDE (GLUCOTROL XL) 10 MG 24 hr tablet Take 10 mg by mouth daily. 06/18/20   [provider]  hydrALAZINE (APRESOLINE) 50 MG tablet Take 1 tablet (50 mg total) by mouth every 8 (eight) hours. 04/19/20 07/18/20  Kayleen Memos, DO  HYDROcodone-acetaminophen (NORCO) 10-325 MG tablet Take 1 tablet by mouth every 6 (six) hours as needed.    [provider]  HYDROmorphone (DILAUDID) 2 MG tablet     [provider]  insulin aspart (NOVOLOG FLEXPEN) 100 UNIT/ML FlexPen Inject 5 Units into the skin 3 (three) times daily with meals. 04/05/20   Oswald Hillock, MD  insulin glargine (LANTUS) 100 UNIT/ML Solostar Pen Inject 12 Units into the skin 2 (two) times daily. 06/09/20   Little Ishikawa, MD  insulin lispro (HUMALOG) 100 UNIT/ML KwikPen Inject into the skin 3 (three) times daily. 06/29/20   [provider]  insulin NPH-regular Human (70-30) 100 UNIT/ML injection     [provider]  Insulin Pen Needle 29G X 12MM MISC Per instructions 04/08/19   [name], MD  JARDIANCE 25 MG TABS tablet Take 25 mg by mouth every morning. 06/29/20   [provider]  losartan (COZAAR) 25 MG tablet Take 25 mg by mouth daily.    [provider]  metoprolol tartrate (LOPRESSOR) 50 MG tablet Take 50 mg by mouth 2 (two) times daily. 06/09/20   [provider]  omeprazole (PRILOSEC) 10 MG capsule Take 10 mg by mouth daily.    [provider]  ondansetron  (ZOFRAN ODT) 4 MG disintegrating tablet Take 1 tablet (4 mg total) by mouth every 8 (eight) hours as needed for nausea or vomiting. 06/09/20   Little Ishikawa, MD  pantoprazole (PROTONIX) 40 MG tablet Take 40 mg by mouth 2 (two) times daily. 06/29/20   [provider]  pregabalin (LYRICA) 50 MG capsule Take 50 mg by mouth 2 (two) times daily. 05/12/20   [provider]  sucralfate (CARAFATE) 1 g tablet Take 1 g by mouth 2 (two) times daily.    [provider]  Suvorexant (BELSOMRA) 10 MG TABS Take 10 mg by mouth at bedtime as needed (sleep).    [provider]  tadalafil (CIALIS) 10 MG tablet Take 20 mg by mouth daily as needed for erectile dysfunction.    [provider]  valACYclovir (VALTREX) 1000 MG tablet  [provider]    Physical Exam: Vitals:   08/28/20 1800 08/28/20 1900 08/28/20 1930 08/28/20 1945  BP: (!) 157/77 139/80 131/81   Pulse: (!) 58 64 62 63  Resp: 19 18  11   Temp:      TempSrc:      SpO2: 100% 100% 100% 100%      Constitutional: Acutely ill looking, anxious Vitals:   08/28/20 1800 08/28/20 1900 08/28/20 1930 08/28/20 1945  BP: (!) 157/77 139/80 131/81   Pulse: (!) 58 64 62 63  Resp: 19 18  11   Temp:      TempSrc:      SpO2: 100% 100% 100% 100%   Eyes: PERRL, lids and conjunctivae normal ENMT: Mucous membranes are moist. Posterior pharynx clear of any exudate or lesions.Normal dentition.  Neck: normal, supple, no masses, no thyromegaly Respiratory: clear to auscultation bilaterally, no wheezing, no crackles. Normal respiratory effort. No accessory muscle use.  Cardiovascular: Sinus bradycardia, no murmurs / rubs / gallops. No extremity edema. 2+ pedal pulses. No carotid bruits.  Abdomen: no tenderness, no masses palpated. No hepatosplenomegaly. Bowel sounds positive.  Musculoskeletal: no clubbing / cyanosis. No joint deformity upper and lower extremities. Good ROM, no contractures. Normal muscle  tone.  Skin: no rashes, lesions, ulcers. No induration Neurologic: CN 2-12 grossly intact. Sensation intact, DTR normal. Strength 5/5 in all 4.  Psychiatric: Normal judgment and insight. Alert and oriented x 3.  Anxious mood.     Labs on Admission: I have personally reviewed following labs and imaging studies  CBC: Recent Labs  Lab 08/28/20 1405  WBC 7.1  NEUTROABS 3.7  HGB 12.4*  HCT 37.4*  MCV 88.2  PLT 748   Basic Metabolic Panel: Recent Labs  Lab 08/28/20 1405  NA 137  K 5.1  CL 100  CO2 24  GLUCOSE 437*  BUN 33*  CREATININE 1.66*  CALCIUM 9.8   GFR: CrCl cannot be calculated (Unknown ideal weight.). Liver Function Tests: Recent Labs  Lab 08/28/20 1405  AST 17  ALT 15  ALKPHOS 64  BILITOT 0.6  PROT 7.5  ALBUMIN 4.2   Recent Labs  Lab 08/28/20 1405  LIPASE 24   No results for input(s): AMMONIA in the last 168 hours. Coagulation Profile: No results for input(s): INR, PROTIME in the last 168 hours. Cardiac Enzymes: No results for input(s): CKTOTAL, CKMB, CKMBINDEX, TROPONINI in the last 168 hours. BNP (last 3 results) No results for input(s): PROBNP in the last 8760 hours. HbA1C: No results for input(s): HGBA1C in the last 72 hours. CBG: Recent Labs  Lab 08/28/20 1933  GLUCAP 166*   Lipid Profile: No results for input(s): CHOL, HDL, LDLCALC, TRIG, CHOLHDL, LDLDIRECT in the last 72 hours. Thyroid Function Tests: No results for input(s): TSH, T4TOTAL, FREET4, T3FREE, THYROIDAB in the last 72 hours. Anemia Panel: No results for input(s): VITAMINB12, FOLATE, FERRITIN, TIBC, IRON, RETICCTPCT in the last 72 hours. Urine analysis:    Component Value Date/Time   COLORURINE YELLOW 07/11/2020 0322   APPEARANCEUR CLEAR 07/11/2020 0322   LABSPEC 1.012 07/11/2020 0322   PHURINE 7.0 07/11/2020 0322   GLUCOSEU >=500 (A) 07/11/2020 0322   HGBUR NEGATIVE 07/11/2020 0322   BILIRUBINUR NEGATIVE 07/11/2020 0322   KETONESUR 5 (A) 07/11/2020 0322    PROTEINUR 30 (A) 07/11/2020 0322   NITRITE NEGATIVE 07/11/2020 0322   LEUKOCYTESUR NEGATIVE 07/11/2020 0322   Sepsis Labs: @LABRCNTIP (procalcitonin:4,lacticidven:4) )No results found for this or any previous visit (from the past 240 hour(s)).  Radiological Exams on Admission: CT ABDOMEN PELVIS W CONTRAST  Result Date: 08/28/2020 CLINICAL DATA:  Abdominal distension.  Chronic abdominal pain. EXAM: CT ABDOMEN AND PELVIS WITH CONTRAST TECHNIQUE: Multidetector CT imaging of the abdomen and pelvis was performed using the standard protocol following bolus administration of intravenous contrast. CONTRAST:  73m OMNIPAQUE IOHEXOL 350 MG/ML SOLN COMPARISON:  07/11/2020 FINDINGS: Lower chest: Included lung bases are clear.  Heart size is normal. Hepatobiliary: No focal liver abnormality is seen. No gallstones, gallbladder wall thickening, or biliary dilatation. Pancreas: Unremarkable. No pancreatic ductal dilatation or surrounding inflammatory changes. Spleen: Normal in size without focal abnormality. Adrenals/Urinary Tract: Unremarkable adrenal glands. Chronic areas of cortical scarring involving the right kidney. Stable small right renal cysts. Multiple punctate nonobstructing stones within the right kidney. No hydronephrosis either kidney. Urinary bladder appears unremarkable. Stomach/Bowel: Stomach is unremarkable. Dilated loops of bowel. Moderate-large volume of stool throughout the colon. No focal bowel wall thickening or inflammatory changes. Vascular/Lymphatic: Prominent atherosclerotic calcifications throughout the aortoiliac axis. No aneurysm. No abdominopelvic lymphadenopathy identified. Reproductive: Prostate is unremarkable. Other: No free fluid. No abdominopelvic fluid collection. No pneumoperitoneum. No abdominal wall hernia. Musculoskeletal: No acute or significant osseous findings. Degenerative facet arthropathy of the lower lumbar spine. IMPRESSION: 1. No acute abdominopelvic findings. 2.  Moderate-large volume of stool throughout the colon. 3. Nonobstructing right nephrolithiasis. 4. Aortic atherosclerosis (ICD10-I70.0). Electronically Signed   By: NDavina PokeD.O.   On: 08/28/2020 18:43   DG Chest Port 1 View  Result Date: 08/28/2020 CLINICAL DATA:  Chest pain with nausea and vomiting for 2 days, initial encounter EXAM: PORTABLE CHEST 1 VIEW COMPARISON:  07/09/2020 FINDINGS: Cardiac shadow is within normal limits. Aortic calcifications are noted. The lungs are well aerated bilaterally. No focal infiltrate or effusion is seen. No bony abnormality is noted. IMPRESSION: No acute abnormality noted. Electronically Signed   By: MInez CatalinaM.D.   On: 08/28/2020 20:08    EKG: Independently reviewed.  It shows normal sinus rhythm with a rate of 61.  Normal intervals.  Evidence of LVH by voltage criteria.  Nonspecific ST changes  Assessment/Plan Principal Problem:   NSTEMI (non-ST elevated myocardial infarction) (HCC) Active Problems:   Uncomplicated alcohol dependence (HCC)   AKI (acute kidney injury) (HCC)   DM2 (diabetes mellitus, type 2) (HCC)   Elevated troponin   Hypertensive urgency   [place]     #1 NSTEMI: Suspected NSTEMI based on enzymes of more than 200.  Despite renal insufficiency his typical troponin was in the tens.  We will admit therefore start heparin drip.  We will hold off on beta-blockers as we suspect cocaine abuse.  Monitor closely.  Cardiology already consulted.  He had recent echocardiogram that was fine but we will repeat it in the setting of suspected NSTEMI.  #2 uncontrolled diabetes: Hydrate patient and initiate sliding scale insulin.  Long-acting insulin will likely be added.  #3 AKI: Most likely prerenal.  Hydrate with saline and monitor.  #4 hypertension: We will confirm home regimen.  Avoiding beta-blockers.  ACE inhibitor is also will be avoided due to renal insufficiency.  We will monitor patient closely.  #5 polysubstance  abuse: Urine drug screen is currently pending.  Denied alcohol or recent cocaine use for about a week.  Counseling to be provided.   DVT prophylaxis: Heparin drip Code Status: Full code Family Communication: No family at bedside Disposition Plan: Home Consults called: Cardiology fellow on-call Admission status: Inpatient  Severity of Illness: The appropriate patient  status for this patient is INPATIENT. Inpatient status is judged to be reasonable and necessary in order to provide the required intensity of service to ensure the patient's safety. The patient's presenting symptoms, physical exam findings, and initial radiographic and laboratory data in the context of their chronic comorbidities is felt to place them at high risk for further clinical deterioration. Furthermore, it is not anticipated that the patient will be medically stable for discharge from the hospital within 2 midnights of admission. The following factors support the patient status of inpatient.   " The patient's presenting symptoms include abdominal and chest pain. " The worrisome physical exam findings include anxiety states. " The initial radiographic and laboratory data are worrisome because of elevated troponin. " The chronic co-morbidities include polysubstance abuse.   * I certify that at the point of admission it is my clinical judgment that the patient will require inpatient hospital care spanning beyond 2 midnights from the point of admission due to high intensity of service, high risk for further deterioration and high frequency of surveillance required.Barbette Merino MD [facility] Pager [phone]  If 7PM-7AM, please contact night-coverage [email or web address] Password [facility]  08/28/2020, 8:23 PM

## 2020-08-28 NOTE — Progress Notes (Addendum)
ANTICOAGULATION CONSULT NOTE - Initial Consult  Pharmacy Consult for heparin Indication:  r/o ACS  Allergies  Allergen Reactions   Trazodone     Other reaction(s): Priapism Pt reports not allergic 06/07/20   Lisinopril Swelling    Angioedema Pt reports not allergic 06/07/20   Omeprazole     Other reaction(s): ANGIOEDEMA OF LIPS, Angioedema of tongue. Pt reports not allergic 06/07/20    Patient Measurements:   Most recent weight in chart from 08/12/20 102.4 lb= 46.5 kg Estimated CrCl is to be ~29 mL/min  Vital Signs: Temp: 98.1 F (36.7 C) (08/12 1343) Temp Source: Oral (08/12 1343) BP: 131/81 (08/12 1930) Pulse Rate: 63 (08/12 1945)  Labs: Recent Labs    08/28/20 1405 08/28/20 1728  HGB 12.4*  --   HCT 37.4*  --   PLT 287  --   CREATININE 1.66*  --   TROPONINIHS  --  206*    CrCl cannot be calculated (Unknown ideal weight.).   Medical History: Past Medical History:  Diagnosis Date   Carpal tunnel syndrome    Depression    Diabetes mellitus without complication (HCC)    Hypertensive urgency 04/14/2020   Neuropathy     Medications:  Scheduled:   heparin  2,500 Units Intravenous Once   Infusions:   heparin      Assessment: 65 yo male presenting with nausea, vomiting, and abdominal pain since last night.  No anticoagulation noted PTA.  Noted to be in NSR on EKG, troponin's elevated at 206.  Patient is at risk for cocaine-induced MI.  Pharmacy consulted to dose heparin for possible ACS.  CBC stable.  Goal of Therapy:  Heparin level 0.3-0.7 units/ml Monitor platelets by anticoagulation protocol: Yes   Plan:  Give heparin 2500 units bolus x1 (smaller bolus given weight loss and unsure of exact weight) Start heparin drip at 550 units/hr F/u 8 hour HL Monitor daily HL and CBC while on heparin F/u cardiology recommendations and ACS w/u   Rexford Maus, PharmD 08/28/2020 8:30 PM

## 2020-08-28 NOTE — ED Triage Notes (Signed)
Per EMS, pt from home here for n/v/abd pain since last night. CBG 436. Hx of chronic abd pain. 250cc NS, zofran given en route. No diarrhea. Reports shingles. NSR on monitor.

## 2020-08-29 ENCOUNTER — Encounter (HOSPITAL_COMMUNITY): Payer: Self-pay | Admitting: Emergency Medicine

## 2020-08-29 ENCOUNTER — Inpatient Hospital Stay (HOSPITAL_COMMUNITY): Payer: No Typology Code available for payment source

## 2020-08-29 ENCOUNTER — Emergency Department (HOSPITAL_COMMUNITY)
Admission: EM | Admit: 2020-08-29 | Discharge: 2020-08-29 | Disposition: A | Discharge status: Home / Self Care | Payer: No Typology Code available for payment source | Attending: Emergency Medicine | Admitting: Emergency Medicine

## 2020-08-29 DIAGNOSIS — N179 Acute kidney failure, unspecified: Secondary | ICD-10-CM | POA: Diagnosis not present

## 2020-08-29 DIAGNOSIS — E1100 Type 2 diabetes mellitus with hyperosmolarity without nonketotic hyperglycemic-hyperosmolar coma (NKHHC): Secondary | ICD-10-CM

## 2020-08-29 DIAGNOSIS — I214 Non-ST elevation (NSTEMI) myocardial infarction: Principal | ICD-10-CM

## 2020-08-29 DIAGNOSIS — Z5321 Procedure and treatment not carried out due to patient leaving prior to being seen by health care provider: Secondary | ICD-10-CM | POA: Insufficient documentation

## 2020-08-29 DIAGNOSIS — I248 Other forms of acute ischemic heart disease: Secondary | ICD-10-CM

## 2020-08-29 DIAGNOSIS — R1084 Generalized abdominal pain: Secondary | ICD-10-CM | POA: Diagnosis not present

## 2020-08-29 DIAGNOSIS — R112 Nausea with vomiting, unspecified: Secondary | ICD-10-CM | POA: Insufficient documentation

## 2020-08-29 DIAGNOSIS — F1410 Cocaine abuse, uncomplicated: Secondary | ICD-10-CM | POA: Diagnosis not present

## 2020-08-29 DIAGNOSIS — R197 Diarrhea, unspecified: Secondary | ICD-10-CM | POA: Insufficient documentation

## 2020-08-29 LAB — CBC WITH DIFFERENTIAL/PLATELET
Abs Immature Granulocytes: 0.02 10*3/uL (ref 0.00–0.07)
Basophils Absolute: 0 10*3/uL (ref 0.0–0.1)
Basophils Relative: 0 %
Eosinophils Absolute: 0 10*3/uL (ref 0.0–0.5)
Eosinophils Relative: 0 %
HCT: 37 % — ABNORMAL LOW (ref 39.0–52.0)
Hemoglobin: 12.4 g/dL — ABNORMAL LOW (ref 13.0–17.0)
Immature Granulocytes: 0 %
Lymphocytes Relative: 30 %
Lymphs Abs: 1.8 10*3/uL (ref 0.7–4.0)
MCH: 29.1 pg (ref 26.0–34.0)
MCHC: 33.5 g/dL (ref 30.0–36.0)
MCV: 86.9 fL (ref 80.0–100.0)
Monocytes Absolute: 0.4 10*3/uL (ref 0.1–1.0)
Monocytes Relative: 6 %
Neutro Abs: 3.8 10*3/uL (ref 1.7–7.7)
Neutrophils Relative %: 64 %
Platelets: 285 10*3/uL (ref 150–400)
RBC: 4.26 MIL/uL (ref 4.22–5.81)
RDW: 12.9 % (ref 11.5–15.5)
WBC: 5.9 10*3/uL (ref 4.0–10.5)
nRBC: 0 % (ref 0.0–0.2)

## 2020-08-29 LAB — COMPREHENSIVE METABOLIC PANEL
ALT: 16 U/L (ref 0–44)
AST: 20 U/L (ref 15–41)
Albumin: 3.9 g/dL (ref 3.5–5.0)
Alkaline Phosphatase: 66 U/L (ref 38–126)
Anion gap: 10 (ref 5–15)
BUN: 20 mg/dL (ref 8–23)
CO2: 24 mmol/L (ref 22–32)
Calcium: 9.3 mg/dL (ref 8.9–10.3)
Chloride: 101 mmol/L (ref 98–111)
Creatinine, Ser: 0.85 mg/dL (ref 0.61–1.24)
GFR, Estimated: >60 mL/min (ref >60)
Glucose, Bld: 131 mg/dL — ABNORMAL HIGH (ref 70–99)
Potassium: 3.2 mmol/L — ABNORMAL LOW (ref 3.5–5.1)
Sodium: 135 mmol/L (ref 135–145)
Total Bilirubin: 0.5 mg/dL (ref 0.3–1.2)
Total Protein: 7.3 g/dL (ref 6.5–8.1)

## 2020-08-29 LAB — LIPID PANEL
Cholesterol: 180 mg/dL (ref 0–200)
HDL: 41 mg/dL (ref >40)
LDL Cholesterol: 126 mg/dL — ABNORMAL HIGH (ref 0–99)
Total CHOL/HDL Ratio: 4.4 RATIO
Triglycerides: 66 mg/dL (ref <150)
VLDL: 13 mg/dL (ref 0–40)

## 2020-08-29 LAB — HEPARIN LEVEL (UNFRACTIONATED): Heparin Unfractionated: <0.1 IU/mL — ABNORMAL LOW (ref 0.30–0.70)

## 2020-08-29 LAB — CBG MONITORING, ED
Glucose-Capillary: 266 mg/dL — ABNORMAL HIGH (ref 70–99)
Glucose-Capillary: 109 mg/dL — ABNORMAL HIGH (ref 70–99)

## 2020-08-29 LAB — TROPONIN I (HIGH SENSITIVITY)
Troponin I (High Sensitivity): 223 ng/L (ref <18)
Troponin I (High Sensitivity): 126 ng/L (ref <18)

## 2020-08-29 LAB — LIPASE, BLOOD: Lipase: 23 U/L (ref 11–51)

## 2020-08-29 MED ORDER — GABAPENTIN 300 MG PO CAPS
300.0000 mg | ORAL_CAPSULE | Freq: Two times a day (BID) | ORAL | Status: DC
  Administered 2020-08-29: 300 mg via ORAL
  Filled 2020-08-29: qty 1

## 2020-08-29 MED ORDER — OXYCODONE HCL 5 MG PO TABS
5.0000 mg | ORAL_TABLET | Freq: Once | ORAL | Status: AC
  Administered 2020-08-29: 5 mg via ORAL
  Filled 2020-08-29: qty 1

## 2020-08-29 MED ORDER — HEPARIN BOLUS VIA INFUSION
1500.0000 [IU] | Freq: Once | INTRAVENOUS | Status: AC
  Administered 2020-08-29: 1500 [IU] via INTRAVENOUS
  Filled 2020-08-29: qty 1500

## 2020-08-29 NOTE — ED Notes (Signed)
Pt pulled out IV and stated "I am outta here". Pt had been given pain medication prior. Pt then left room without belongings and one IV still in place. Pt was stopped in hallway and second IV removed with Charge nurse present, Pt then proceeded to leave building.

## 2020-08-29 NOTE — ED Notes (Signed)
No answer for treatment room. 

## 2020-08-29 NOTE — ED Notes (Signed)
Pt reporting 10/10 pain due to shingles. RN offered PRN Tylenol, which pt refused. Pt requesting stronger pain medication. MD Garba notified via page.

## 2020-08-29 NOTE — ED Provider Notes (Signed)
Emergency Medicine Provider Triage Evaluation Note  William Acevedo , a 65 y.o. male  was evaluated in triage.  Pt complains of ongoing abdominal pain with nausea and vomiting. Seen yesterday at [facility] for same, plan to admit for monitoring (elevated trop thought to be due to demand from possible recent cocaine use). Patient left AMA and presents today with ongoing pain.   Review of Systems  Positive: Abdominal pain, nausea, vomiting, constipation and diarrhea Negative: fever  Physical Exam  BP (!) 143/103 (BP Location: Left Arm)   Pulse 96   Temp 98.4 F (36.9 C)   Resp 14   SpO2 100%  Gen:   Awake, no distress   Resp:  Normal effort  MSK:   Moves extremities without difficulty  Other:    Medical Decision Making  Medically screening exam initiated at 1:50 PM.  Appropriate orders placed.  William Acevedo was informed that the remainder of the evaluation will be completed by another provider, this initial triage assessment does not replace that evaluation, and the importance of remaining in the ED until their evaluation is complete.     Jeannie Fend, PA-C 08/29/20 1352    Sloan Leiter, DO 08/30/20 1114

## 2020-08-29 NOTE — ED Triage Notes (Signed)
Reports generalized abd pain, nausea, vomiting, and diarrhea since yesterday.  States he was admitted to [facility] yesterday and left today due to pain uncontrolled.

## 2020-08-29 NOTE — Consult Note (Signed)
CONSULTATION NOTE   Patient Name: William Acevedo Date of Encounter: 08/29/2020 Cardiologist: None Electrophysiologist: None Advanced Heart Failure: None   Chief Complaint   Elevated troponin  Patient Profile   65 yo male with DM2, HTN, neuropathy, depression, polysubstance abuse and history of stroke, presents with nausea, vomiting and abdominal pain, found to have elevated troponin.  HPI   William Acevedo is a 65 y.o. male who is being seen today for the evaluation of elevated troponiin at the request of Dr. Jonelle Sidle. This is a 65 year old male with a history of polysubstance abuse including cocaine and alcohol, hypertension, diabetes with neuropathy, prior stroke, and depression, who presents with nausea and vomiting as well as abdominal pain.  He noted some chest pain as well.  It was noted to have a high blood sugar 4 and 36.  Troponin was noted to be elevated but flatly at 206, 225, 215, and 223.  Findings more consistent with demand ischemia.  He had previous troponins drawn about a month ago which were also flat mildly elevated at 35, 36 and 33.  Echo at that time showed normal LVEF 55 to 60%.  Rapid drug screen this admission positive for opiates but negative for cocaine, this was detected positive during several other previous visits.  He underwent a TEE procedure in March 2022 for stroke and was found to have a normal LVEF 65 to 70% with no LV thrombus.  Bubble study was mildly positive with Valsalva.  Today he was noted to be markedly uncomfortable, curled up in a ball on the hospital bed and writhing somewhat in pain when I went in to see him.  He is complaining predominantly of abdominal pain.  PMHx   Past Medical History:  Diagnosis Date   Carpal tunnel syndrome    Depression    Diabetes mellitus without complication ([place])    Hypertensive urgency 04/14/2020   Neuropathy     Past Surgical History:  Procedure Laterality Date   BUBBLE STUDY  03/30/2020    Procedure: BUBBLE STUDY;  Surgeon: Jerline Pain, MD;  Location: [facility];  Service: Cardiovascular;;   TEE WITHOUT CARDIOVERSION N/A 03/30/2020   Procedure: TRANSESOPHAGEAL ECHOCARDIOGRAM (TEE);  Surgeon: Jerline Pain, MD;  Location: [facility] ENDOSCOPY;  Service: Cardiovascular;  Laterality: N/A;   TESTICLE TORSION REDUCTION      FAMHx   No family history of early onset heart disease  SOCHx    reports that he has been smoking cigarettes. He has been smoking an average of .5 packs per day. He has never used smokeless tobacco. He reports current alcohol use. He reports current drug use. Drug: Cocaine.  Outpatient Medications   No current facility-administered medications on file prior to encounter.   Current Outpatient Medications on File Prior to Encounter  Medication Sig Dispense Refill   amitriptyline (ELAVIL) 10 MG tablet Take 10 mg by mouth at bedtime.     amLODipine (NORVASC) 10 MG tablet Take 10 mg by mouth daily.     aspirin EC 81 MG EC tablet Take 1 tablet (81 mg total) by mouth daily. Swallow whole. 30 tablet 11   clopidogrel (PLAVIX) 75 MG tablet Take 1 tablet (75 mg total) by mouth daily. 20 tablet 0   cyclobenzaprine (FLEXERIL) 5 MG tablet Take 1 tablet (5 mg total) by mouth 3 (three) times daily as needed for muscle spasms. 10 tablet 0   dicyclomine (BENTYL) 20 MG tablet Take 20 mg by mouth 3 (three) times daily.  docusate sodium (COLACE) 100 MG capsule Take 100 mg by mouth 2 (two) times daily.     famotidine (PEPCID) 20 MG tablet Take 20 mg by mouth daily.     gabapentin (NEURONTIN) 100 MG capsule Take 100 mg by mouth 3 (three) times daily.     glipiZIDE (GLUCOTROL) 10 MG tablet Take 10 mg by mouth daily before breakfast.     hydrALAZINE (APRESOLINE) 50 MG tablet Take 50 mg by mouth 3 (three) times daily.     HYDROcodone-acetaminophen (NORCO) 10-325 MG tablet Take 1 tablet by mouth every 6 (six) hours as needed for moderate pain or severe pain.     insulin glargine  (LANTUS) 100 UNIT/ML Solostar Pen Inject 12 Units into the skin 2 (two) times daily. 15 mL 0   insulin lispro (HUMALOG) 100 UNIT/ML KwikPen Inject 0-30 Units into the skin as directed. INJECT SUBCUTANEOUSLY BASED ON BLOOD SUGAR RESULTS WITH MEALS THREE TIMES DAILY PRN FOR HIGH BLOOD SUGAR. MAX OF 30 UNITS IN 24 HOURS     metoprolol succinate (TOPROL-XL) 50 MG 24 hr tablet Take 25 mg by mouth daily. Take with or immediately following a meal.     ondansetron (ZOFRAN) 8 MG tablet Take 8 mg by mouth every 8 (eight) hours as needed for nausea or vomiting.     pantoprazole (PROTONIX) 40 MG tablet Take 40 mg by mouth 2 (two) times daily.     pregabalin (LYRICA) 50 MG capsule Take 50 mg by mouth 2 (two) times daily.     rosuvastatin (CRESTOR) 40 MG tablet Take 40 mg by mouth daily.     Suvorexant (BELSOMRA) 10 MG TABS Take 10 mg by mouth at bedtime.     valACYclovir (VALTREX) 500 MG tablet Take 500 mg by mouth 2 (two) times daily.     atorvastatin (LIPITOR) 40 MG tablet Take 1 tablet (40 mg total) by mouth at bedtime. (Patient not taking: No sig reported) 30 tablet 0   blood glucose meter kit and supplies KIT Dispense based on patient and insurance preference. Use up to four times daily as directed. (FOR ICD-9 250.00, 250.01). 1 each 0   hydrALAZINE (APRESOLINE) 50 MG tablet Take 1 tablet (50 mg total) by mouth every 8 (eight) hours. 270 tablet 0   insulin aspart (NOVOLOG FLEXPEN) 100 UNIT/ML FlexPen Inject 5 Units into the skin 3 (three) times daily with meals. (Patient not taking: Reported on 08/28/2020) 15 mL 11   Insulin Pen Needle 29G X 12MM MISC Per instructions 200 each 0   ondansetron (ZOFRAN ODT) 4 MG disintegrating tablet Take 1 tablet (4 mg total) by mouth every 8 (eight) hours as needed for nausea or vomiting. (Patient not taking: No sig reported) 10 tablet 0    Inpatient Medications    Scheduled Meds:  insulin aspart  0-20 Units Subcutaneous TID WC   insulin aspart  0-5 Units Subcutaneous  QHS    Continuous Infusions:  sodium chloride 100 mL/hr at 08/28/20 2144   heparin 750 Units/hr (08/29/20 0604)    PRN Meds: acetaminophen, ondansetron (ZOFRAN) IV   ALLERGIES   Allergies  Allergen Reactions   Trazodone     Other reaction(s): Priapism Pt reports not allergic 06/07/20   Lisinopril Swelling    Angioedema Pt reports not allergic 06/07/20   Omeprazole     Other reaction(s): ANGIOEDEMA OF LIPS, Angioedema of tongue. Pt reports not allergic 06/07/20    ROS   Pertinent items noted in HPI and remainder of comprehensive ROS  otherwise negative.  Vitals   Vitals:   08/29/20 0630 08/29/20 0700 08/29/20 0715 08/29/20 0730  BP: (!) 156/83 (!) 178/90 (!) 147/81 134/75  Pulse: 69 73 72 68  Resp: 13 15 15 10   Temp:      TempSrc:      SpO2: 100% 100% 100% 100%   No intake or output data in the 24 hours ending 08/29/20 1008 There were no vitals filed for this visit.  Physical Exam   General appearance: alert, cachectic, and moderate distress Neck: no carotid bruit, no JVD, and thyroid not enlarged, symmetric, no tenderness/mass/nodules Lungs: clear to auscultation bilaterally Heart: regular rate and rhythm Abdomen: Scaphoid, diffusely tender, no rebound or guarding Extremities: extremities normal, atraumatic, no cyanosis or edema Pulses: 2+ and symmetric Skin: Skin color, texture, turgor normal. No rashes or lesions Neurologic: Mental status: Appears in pain, follows commands Psych: In pain  Labs   Results for orders placed or performed during the hospital encounter of 08/28/20 (from the past 48 hour(s))  CBC with Differential     Status: Abnormal   Collection Time: 08/28/20  2:05 PM  Result Value Ref Range   WBC 7.1 4.0 - 10.5 K/uL   RBC 4.24 4.22 - 5.81 MIL/uL   Hemoglobin 12.4 (L) 13.0 - 17.0 g/dL   HCT 37.4 (L) 39.0 - 52.0 %   MCV 88.2 80.0 - 100.0 fL   MCH 29.2 26.0 - 34.0 pg   MCHC 33.2 30.0 - 36.0 g/dL   RDW 13.2 11.5 - 15.5 %   Platelets 287  150 - 400 K/uL   nRBC 0.0 0.0 - 0.2 %   Neutrophils Relative % 53 %   Neutro Abs 3.7 1.7 - 7.7 K/uL   Lymphocytes Relative 37 %   Lymphs Abs 2.7 0.7 - 4.0 K/uL   Monocytes Relative 10 %   Monocytes Absolute 0.7 0.1 - 1.0 K/uL   Eosinophils Relative 0 %   Eosinophils Absolute 0.0 0.0 - 0.5 K/uL   Basophils Relative 0 %   Basophils Absolute 0.0 0.0 - 0.1 K/uL   Immature Granulocytes 0 %   Abs Immature Granulocytes 0.03 0.00 - 0.07 K/uL    Comment: Performed at [facility], [place] [address] [place], [place] [address]  Comprehensive metabolic panel     Status: Abnormal   Collection Time: 08/28/20  2:05 PM  Result Value Ref Range   Sodium 137 135 - 145 mmol/L   Potassium 5.1 3.5 - 5.1 mmol/L   Chloride 100 98 - 111 mmol/L   CO2 24 22 - 32 mmol/L   Glucose, Bld 437 (H) 70 - 99 mg/dL    Comment: Glucose reference range applies only to samples taken after fasting for at least 8 hours.   BUN 33 (H) 8 - 23 mg/dL   Creatinine, Ser 1.66 (H) 0.61 - 1.24 mg/dL   Calcium 9.8 8.9 - 10.3 mg/dL   Total Protein 7.5 6.5 - 8.1 g/dL   Albumin 4.2 3.5 - 5.0 g/dL   AST 17 15 - 41 U/L   ALT 15 0 - 44 U/L   Alkaline Phosphatase 64 38 - 126 U/L   Total Bilirubin 0.6 0.3 - 1.2 mg/dL   GFR, Estimated 45 (L) >60 mL/min    Comment: (NOTE) Calculated using the CKD-EPI Creatinine Equation (2021)    Anion gap 13 5 - 15    Comment: Performed at [facility], [place] [address] [place], [place] [address]  Lipase,  blood     Status: None   Collection Time: 08/28/20  2:05 PM  Result Value Ref Range   Lipase 24 11 - 51 U/L    Comment: Performed at [facility], [place] [address] [place], [place] [address]  Troponin I (High Sensitivity)     Status: Abnormal   Collection Time: 08/28/20  5:28 PM  Result Value Ref Range   Troponin I (High Sensitivity) 206 (HH) <18 ng/L    Comment: CRITICAL RESULT CALLED TO, READ BACK BY AND VERIFIED WITH: JESSICA LAUDERMILK AT  1924 ON 08/28/20 BY MAJ Performed at [facility], [place] [address] [place], [place] [address] CORRECTED ON 08/12 AT 1925: PREVIOUSLY REPORTED AS 206 CRITICAL VALUE NOTED.  VALUE IS CONSISTENT WITH PREVIOUSLY REPORTED AND CALLED VALUE.   Troponin I (High Sensitivity)     Status: Abnormal   Collection Time: 08/28/20  7:28 PM  Result Value Ref Range   Troponin I (High Sensitivity) 225 (HH) <18 ng/L    Comment: CRITICAL RESULT CALLED TO, READ BACK BY AND VERIFIED WITH: Dixon Boos AT 2049 ON 08/28/20 BY MAJ (NOTE) Elevated high sensitivity troponin I (hsTnI) values and significant  changes across serial measurements may suggest ACS but many other  chronic and acute conditions are known to elevate hsTnI results.  Refer to the Links section for chest pain algorithms and additional  guidance. Performed at [facility], [place] [address], [place], [place] [address]   CBG monitoring, ED     Status: Abnormal   Collection Time: 08/28/20  7:33 PM  Result Value Ref Range   Glucose-Capillary 166 (H) 70 - 99 mg/dL    Comment: Glucose reference range applies only to samples taken after fasting for at least 8 hours.  Urinalysis, Routine w reflex microscopic Urine, Clean Catch     Status: Abnormal   Collection Time: 08/28/20  8:13 PM  Result Value Ref Range   Color, Urine YELLOW YELLOW   APPearance HAZY (A) CLEAR   Specific Gravity, Urine 1.039 (H) 1.005 - 1.030   pH 5.0 5.0 - 8.0   Glucose, UA >=500 (A) NEGATIVE mg/dL   Hgb urine dipstick NEGATIVE NEGATIVE   Bilirubin Urine NEGATIVE NEGATIVE   Ketones, ur NEGATIVE NEGATIVE mg/dL   Protein, ur 100 (A) NEGATIVE mg/dL   Nitrite NEGATIVE NEGATIVE   Leukocytes,Ua NEGATIVE NEGATIVE   RBC / HPF 0-5 0 - 5 RBC/hpf   WBC, UA 0-5 0 - 5 WBC/hpf   Bacteria, UA NONE SEEN NONE SEEN   Hyaline Casts, UA PRESENT     Comment: Performed at [facility], [place] [address], [place], [place] [address]  Rapid  urine drug screen (hospital performed)     Status: Abnormal   Collection Time: 08/28/20  8:13 PM  Result Value Ref Range   Opiates POSITIVE (A) NONE DETECTED   Cocaine NONE DETECTED NONE DETECTED   Benzodiazepines NONE DETECTED NONE DETECTED   Amphetamines NONE DETECTED NONE DETECTED   Tetrahydrocannabinol NONE DETECTED NONE DETECTED   Barbiturates NONE DETECTED NONE DETECTED    Comment: (NOTE) DRUG SCREEN FOR MEDICAL PURPOSES ONLY.  IF CONFIRMATION IS NEEDED FOR ANY PURPOSE, NOTIFY LAB WITHIN 5 DAYS.  LOWEST DETECTABLE LIMITS FOR URINE DRUG SCREEN Drug Class                     Cutoff (ng/mL) Amphetamine and metabolites    1000 Barbiturate and metabolites    200 Benzodiazepine  542 Tricyclics and metabolites     300 Opiates and metabolites        300 Cocaine and metabolites        300 THC                            50 Performed at [facility], [place] [address], [place], [place] [address]   Resp Panel by RT-PCR (Flu A&B, Covid) Nasopharyngeal Swab     Status: None   Collection Time: 08/28/20  8:36 PM   Specimen: Nasopharyngeal Swab; Nasopharyngeal(NP) swabs in vial transport medium  Result Value Ref Range   SARS Coronavirus 2 by RT PCR NEGATIVE NEGATIVE    Comment: (NOTE) SARS-CoV-2 target nucleic acids are NOT DETECTED.  The SARS-CoV-2 RNA is generally detectable in upper respiratory specimens during the acute phase of infection. The lowest concentration of SARS-CoV-2 viral copies this assay can detect is 138 copies/mL. A negative result does not preclude SARS-Cov-2 infection and should not be used as the sole basis for treatment or other patient management decisions. A negative result may occur with  improper specimen collection/handling, submission of specimen other than nasopharyngeal swab, presence of viral mutation(s) within the areas targeted by this assay, and inadequate number of viral copies(<138 copies/mL). A negative result  must be combined with clinical observations, patient history, and epidemiological information. The expected result is Negative.  Fact Sheet for Patients:  EntrepreneurPulse.com.au  Fact Sheet for Healthcare Providers:  IncredibleEmployment.be  This test is no t yet approved or cleared by the [place] FDA and  has been authorized for detection and/or diagnosis of SARS-CoV-2 by FDA under an Emergency Use Authorization (EUA). This EUA will remain  in effect (meaning this test can be used) for the duration of the COVID-19 declaration under Section 564(b)(1) of the Act, 21 U.S.C.section 360bbb-3(b)(1), unless the authorization is terminated  or revoked sooner.       Influenza A by PCR NEGATIVE NEGATIVE   Influenza B by PCR NEGATIVE NEGATIVE    Comment: (NOTE) The Xpert Xpress SARS-CoV-2/FLU/RSV plus assay is intended as an aid in the diagnosis of influenza from Nasopharyngeal swab specimens and should not be used as a sole basis for treatment. Nasal washings and aspirates are unacceptable for Xpert Xpress SARS-CoV-2/FLU/RSV testing.  Fact Sheet for Patients: EntrepreneurPulse.com.au  Fact Sheet for Healthcare Providers: IncredibleEmployment.be  This test is not yet approved or cleared by the [place] FDA and has been authorized for detection and/or diagnosis of SARS-CoV-2 by FDA under an Emergency Use Authorization (EUA). This EUA will remain in effect (meaning this test can be used) for the duration of the COVID-19 declaration under Section 564(b)(1) of the Act, 21 U.S.C. section 360bbb-3(b)(1), unless the authorization is terminated or revoked.  Performed at [facility], [place] [address], [place], [place] [address]   Troponin I (High Sensitivity)     Status: Abnormal   Collection Time: 08/28/20  9:31 PM  Result Value Ref Range   Troponin I (High Sensitivity) 215 (HH) <18  ng/L    Comment: CRITICAL VALUE NOTED.  VALUE IS CONSISTENT WITH PREVIOUSLY REPORTED AND CALLED VALUE. (NOTE) Elevated high sensitivity troponin I (hsTnI) values and significant  changes across serial measurements may suggest ACS but many other  chronic and acute conditions are known to elevate hsTnI results.  Refer to the Links section for chest pain algorithms and additional  guidance. Performed at [facility],  [place] [address], [place], [place] [address]   CBG monitoring, ED     Status: None   Collection Time: 08/28/20 10:04 PM  Result Value Ref Range   Glucose-Capillary 90 70 - 99 mg/dL    Comment: Glucose reference range applies only to samples taken after fasting for at least 8 hours.  Lipid panel     Status: Abnormal   Collection Time: 08/29/20 12:55 AM  Result Value Ref Range   Cholesterol 180 0 - 200 mg/dL   Triglycerides 66 <150 mg/dL   HDL 41 >40 mg/dL   Total CHOL/HDL Ratio 4.4 RATIO   VLDL 13 0 - 40 mg/dL   LDL Cholesterol 126 (H) 0 - 99 mg/dL    Comment:        Total Cholesterol/HDL:CHD Risk Coronary Heart Disease Risk Table                     Men   Women  1/2 Average Risk   3.4   3.3  Average Risk       5.0   4.4  2 X Average Risk   9.6   7.1  3 X Average Risk  23.4   11.0        Use the calculated Patient Ratio above and the CHD Risk Table to determine the patient's CHD Risk.        ATP III CLASSIFICATION (LDL):  <100     mg/dL   Optimal  100-129  mg/dL   Near or Above                    Optimal  130-159  mg/dL   Borderline  160-189  mg/dL   High  >190     mg/dL   Very High Performed at [place] [address], [place], [place] [address]   Troponin I (High Sensitivity)     Status: Abnormal   Collection Time: 08/29/20 12:55 AM  Result Value Ref Range   Troponin I (High Sensitivity) 223 (HH) <18 ng/L    Comment: CRITICAL VALUE NOTED.  VALUE IS CONSISTENT WITH PREVIOUSLY REPORTED AND CALLED  VALUE. (NOTE) Elevated high sensitivity troponin I (hsTnI) values and significant  changes across serial measurements may suggest ACS but many other  chronic and acute conditions are known to elevate hsTnI results.  Refer to the Links section for chest pain algorithms and additional  guidance. Performed at [facility], [place] [address] [place], [place] [address]   Heparin level (unfractionated)     Status: Abnormal   Collection Time: 08/29/20  5:05 AM  Result Value Ref Range   Heparin Unfractionated <0.10 (L) 0.30 - 0.70 IU/mL    Comment: (NOTE) The clinical reportable range upper limit is being lowered to >1.10 to align with the FDA approved guidance for the current laboratory assay.  If heparin results are below expected values, and patient dosage has  been confirmed, suggest follow up testing of antithrombin III levels. Performed at [facility], [place] [address] [place], [place] [address]     ECG   Sinus rhythm at 61, nonspecific T wave changes- Personally Reviewed  Telemetry   Sinus rhythm- Personally Reviewed  Radiology   CT ABDOMEN PELVIS W CONTRAST  Result Date: 08/28/2020 CLINICAL DATA:  Abdominal distension.  Chronic abdominal pain. EXAM: CT ABDOMEN AND PELVIS WITH CONTRAST TECHNIQUE: Multidetector CT imaging of the abdomen and pelvis was performed using the standard protocol  following bolus administration of intravenous contrast. CONTRAST:  49m OMNIPAQUE IOHEXOL 350 MG/ML SOLN COMPARISON:  07/11/2020 FINDINGS: Lower chest: Included lung bases are clear.  Heart size is normal. Hepatobiliary: No focal liver abnormality is seen. No gallstones, gallbladder wall thickening, or biliary dilatation. Pancreas: Unremarkable. No pancreatic ductal dilatation or surrounding inflammatory changes. Spleen: Normal in size without focal abnormality. Adrenals/Urinary Tract: Unremarkable adrenal glands. Chronic areas of cortical scarring involving  the right kidney. Stable small right renal cysts. Multiple punctate nonobstructing stones within the right kidney. No hydronephrosis either kidney. Urinary bladder appears unremarkable. Stomach/Bowel: Stomach is unremarkable. Dilated loops of bowel. Moderate-large volume of stool throughout the colon. No focal bowel wall thickening or inflammatory changes. Vascular/Lymphatic: Prominent atherosclerotic calcifications throughout the aortoiliac axis. No aneurysm. No abdominopelvic lymphadenopathy identified. Reproductive: Prostate is unremarkable. Other: No free fluid. No abdominopelvic fluid collection. No pneumoperitoneum. No abdominal wall hernia. Musculoskeletal: No acute or significant osseous findings. Degenerative facet arthropathy of the lower lumbar spine. IMPRESSION: 1. No acute abdominopelvic findings. 2. Moderate-large volume of stool throughout the colon. 3. Nonobstructing right nephrolithiasis. 4. Aortic atherosclerosis (ICD10-I70.0). Electronically Signed   By: NDavina PokeD.O.   On: 08/28/2020 18:43   DG Chest Port 1 View  Result Date: 08/28/2020 CLINICAL DATA:  Chest pain with nausea and vomiting for 2 days, initial encounter EXAM: PORTABLE CHEST 1 VIEW COMPARISON:  07/09/2020 FINDINGS: Cardiac shadow is within normal limits. Aortic calcifications are noted. The lungs are well aerated bilaterally. No focal infiltrate or effusion is seen. No bony abnormality is noted. IMPRESSION: No acute abnormality noted. Electronically Signed   By: MInez CatalinaM.D.   On: 08/28/2020 20:08    Cardiac Studies   Echo pending  Impression   Principal Problem:   NSTEMI (non-ST elevated myocardial infarction) ([place] Active Problems:   Uncomplicated alcohol dependence (HCC)   AKI (acute kidney injury) ([place]   DM2 (diabetes mellitus, type 2) (HCC)   Elevated troponin   Hypertensive urgency   Cocaine abuse ([facility]   Recommendation   Mr. BWeatherallhas flat elevated troponins consistent more with  demand ischemia.  Cocaine was negative during this admission however has been positive in the past.  He has had flat elevated troponins however less significant than this.  This could be related to cardiomyopathy or acute kidney injury and uncontrolled diabetes.  Will review echocardiogram today.  Continue heparin for now, but would not anticipate ischemic evaluation unless there are obvious wall motion abnormalities or significant cardiomyopathy.  He was obviously in significant pain today in the emergency department, this may represent substance withdrawal.  Thanks for the consultation.  Cardiology will follow with you.  Time Spent Directly with Patient:  I have spent a total of 45 minutes with the patient reviewing hospital notes, telemetry, EKGs, labs and examining the patient as well as establishing an assessment and plan that was discussed personally with the patient.  > 50% of time was spent in direct patient care.  Length of Stay:  LOS: 1 day   KPixie Casino MD, FSouthwest [facility] LLC FMartelleDirector of the Advanced Lipid Disorders &  Cardiovascular Risk Reduction Clinic Diplomate of the American Board of Clinical Lipidology Attending Cardiologist  Direct Dial: [phone] Fax: [phone] Website:  [email or web address] 08/29/2020, 10:08 AM

## 2020-08-29 NOTE — ED Notes (Addendum)
Pt belongings were attempted returned to Pt from room in [place] and Pt threw bag and belongings to floor, Ebony and Ladona Ridgel present.

## 2020-08-29 NOTE — Discharge Summary (Signed)
Physician Discharge Summary  William Acevedo DGL:[id number] DOB: 03-Sep-1955 DOA: 08/28/2020  PCP: [facility], [facility]   Admit date: 08/28/2020  Discharge date: 08/29/2020  Admitted From: Home Disposition: Left AGAINST MEDICAL ADVICE  Recommendations for Outpatient Follow-up:  Follow up with PCP in 1-2 weeks Please obtain BMP/CBC in one week   Home Health:None Equipment/Devices:None  Discharge Condition: Stable CODE STATUS:Full code Diet recommendation: Heart Healthy  Brief [facility] Course: This 65 years old male with PMH significant for polysubstance abuse including cocaine and alcohol abuse, hypertension, diabetes, diabetic neuropathy, history of CVA who was here about 2 months ago .  Patient presented in the ED with complaints of abdominal pain associated with nausea and vomiting.  He also reported having intermittent chest pain. He was found to have blood sugar of 436. He does have a history of chronic abdominal pain,  he was given Zofran and IV fluids.  CT abdomen pelvis no acute finding.  Patient also found to have slightly elevated troponin that could be secondary to AKI.  Cardiology was consulted recommended to continue IV heparin.  This could be due to demand ischemia.  Patient was admitted for NSTEMI started on IV heparin.  Patient been complaining about pain in the back and asking for stronger pain medications.  Repeat echocardiogram is ordered.  Patient has decided to leave [place].  Explained in detail about the risk and benefits associated with leaving hospital.  He understand he signed the papers and left.   Discharge Diagnoses:  Principal Problem:   NSTEMI (non-ST elevated myocardial infarction) ([place]) Active Problems:   Uncomplicated alcohol dependence ([place])   AKI (acute kidney injury) ([place])   DM2 (diabetes mellitus, type 2) (HCC)   Elevated troponin   Hypertensive urgency   Cocaine abuse [facility])    Discharge Instructions Left AGAINST  MEDICAL ADVICE.  Allergies as of 08/29/2020       Reactions   Trazodone    Other reaction(s): Priapism Pt reports not allergic 06/07/20   Lisinopril Swelling   Angioedema Pt reports not allergic 06/07/20   Omeprazole    Other reaction(s): ANGIOEDEMA OF LIPS, Angioedema of tongue. Pt reports not allergic 06/07/20        Medication List     ASK your doctor about these medications    amitriptyline 10 MG tablet Commonly known as: ELAVIL Take 10 mg by mouth at bedtime.   amLODipine 10 MG tablet Commonly known as: NORVASC Take 10 mg by mouth daily.   aspirin 81 MG EC tablet Take 1 tablet (81 mg total) by mouth daily. Swallow whole.   atorvastatin 40 MG tablet Commonly known as: LIPITOR Take 1 tablet (40 mg total) by mouth at bedtime. Ask about: Which instructions should I use?   Belsomra 10 MG Tabs Generic drug: Suvorexant Take 10 mg by mouth at bedtime.   blood glucose meter kit and supplies Kit Dispense based on patient and insurance preference. Use up to four times daily as directed. (FOR ICD-9 250.00, 250.01).   clopidogrel 75 MG tablet Commonly known as: PLAVIX Take 1 tablet (75 mg total) by mouth daily.   cyclobenzaprine 5 MG tablet Commonly known as: FLEXERIL Take 1 tablet (5 mg total) by mouth 3 (three) times daily as needed for muscle spasms.   dicyclomine 20 MG tablet Commonly known as: BENTYL Take 20 mg by mouth 3 (three) times daily.   docusate sodium 100 MG capsule Commonly known as: COLACE Take 100 mg by mouth 2 (two) times  daily.   famotidine 20 MG tablet Commonly known as: PEPCID Take 20 mg by mouth daily.   gabapentin 100 MG capsule Commonly known as: NEURONTIN Take 100 mg by mouth 3 (three) times daily. Ask about: Which instructions should I use?   glipiZIDE 10 MG tablet Commonly known as: GLUCOTROL Take 10 mg by mouth daily before breakfast. Ask about: Which instructions should I use?   hydrALAZINE 50 MG tablet Commonly known as:  APRESOLINE Take 50 mg by mouth 3 (three) times daily.   hydrALAZINE 50 MG tablet Commonly known as: APRESOLINE Take 1 tablet (50 mg total) by mouth every 8 (eight) hours.   HYDROcodone-acetaminophen 10-325 MG tablet Commonly known as: NORCO Take 1 tablet by mouth every 6 (six) hours as needed for moderate pain or severe pain.   insulin glargine 100 UNIT/ML Solostar Pen Commonly known as: LANTUS Inject 12 Units into the skin 2 (two) times daily.   insulin lispro 100 UNIT/ML KwikPen Commonly known as: HUMALOG Inject 0-30 Units into the skin as directed. INJECT SUBCUTANEOUSLY BASED ON BLOOD SUGAR RESULTS WITH MEALS THREE TIMES DAILY PRN FOR HIGH BLOOD SUGAR. MAX OF 30 UNITS IN 24 HOURS Ask about: Which instructions should I use?   Insulin Pen Needle 29G X 12MM Misc Per instructions   metoprolol succinate 50 MG 24 hr tablet Commonly known as: TOPROL-XL Take 25 mg by mouth daily. Take with or immediately following a meal.   NovoLOG FlexPen 100 UNIT/ML FlexPen Generic drug: insulin aspart Inject 5 Units into the skin 3 (three) times daily with meals.   ondansetron 4 MG disintegrating tablet Commonly known as: Zofran ODT Take 1 tablet (4 mg total) by mouth every 8 (eight) hours as needed for nausea or vomiting.   ondansetron 8 MG tablet Commonly known as: ZOFRAN Take 8 mg by mouth every 8 (eight) hours as needed for nausea or vomiting.   pantoprazole 40 MG tablet Commonly known as: PROTONIX Take 40 mg by mouth 2 (two) times daily.   pregabalin 50 MG capsule Commonly known as: LYRICA Take 50 mg by mouth 2 (two) times daily.   rosuvastatin 40 MG tablet Commonly known as: CRESTOR Take 40 mg by mouth daily.   valACYclovir 500 MG tablet Commonly known as: VALTREX Take 500 mg by mouth 2 (two) times daily. Ask about: Which instructions should I use?        Allergies  Allergen Reactions   Trazodone     Other reaction(s): Priapism Pt reports not allergic 06/07/20    Lisinopril Swelling    Angioedema Pt reports not allergic 06/07/20   Omeprazole     Other reaction(s): ANGIOEDEMA OF LIPS, Angioedema of tongue. Pt reports not allergic 06/07/20    Consultations: Cardiology   Procedures/Studies: CT ABDOMEN PELVIS W CONTRAST  Result Date: 08/28/2020 CLINICAL DATA:  Abdominal distension.  Chronic abdominal pain. EXAM: CT ABDOMEN AND PELVIS WITH CONTRAST TECHNIQUE: Multidetector CT imaging of the abdomen and pelvis was performed using the standard protocol following bolus administration of intravenous contrast. CONTRAST:  46m OMNIPAQUE IOHEXOL 350 MG/ML SOLN COMPARISON:  07/11/2020 FINDINGS: Lower chest: Included lung bases are clear.  Heart size is normal. Hepatobiliary: No focal liver abnormality is seen. No gallstones, gallbladder wall thickening, or biliary dilatation. Pancreas: Unremarkable. No pancreatic ductal dilatation or surrounding inflammatory changes. Spleen: Normal in size without focal abnormality. Adrenals/Urinary Tract: Unremarkable adrenal glands. Chronic areas of cortical scarring involving the right kidney. Stable small right renal cysts. Multiple punctate nonobstructing stones within the right  kidney. No hydronephrosis either kidney. Urinary bladder appears unremarkable. Stomach/Bowel: Stomach is unremarkable. Dilated loops of bowel. Moderate-large volume of stool throughout the colon. No focal bowel wall thickening or inflammatory changes. Vascular/Lymphatic: Prominent atherosclerotic calcifications throughout the aortoiliac axis. No aneurysm. No abdominopelvic lymphadenopathy identified. Reproductive: Prostate is unremarkable. Other: No free fluid. No abdominopelvic fluid collection. No pneumoperitoneum. No abdominal wall hernia. Musculoskeletal: No acute or significant osseous findings. Degenerative facet arthropathy of the lower lumbar spine. IMPRESSION: 1. No acute abdominopelvic findings. 2. Moderate-large volume of stool throughout the colon.  3. Nonobstructing right nephrolithiasis. 4. Aortic atherosclerosis (ICD10-I70.0). Electronically Signed   By: Davina Poke D.O.   On: 08/28/2020 18:43   DG Chest Port 1 View  Result Date: 08/28/2020 CLINICAL DATA:  Chest pain with nausea and vomiting for 2 days, initial encounter EXAM: PORTABLE CHEST 1 VIEW COMPARISON:  07/09/2020 FINDINGS: Cardiac shadow is within normal limits. Aortic calcifications are noted. The lungs are well aerated bilaterally. No focal infiltrate or effusion is seen. No bony abnormality is noted. IMPRESSION: No acute abnormality noted. Electronically Signed   By: Inez Catalina M.D.   On: 08/28/2020 20:08      Subjective: Patient was seen and examined at bedside.  Overnight events noted.  Patient has decided to leave [place].  It was explained to the patient about the risk and benefits.  He understood.  Discharge Exam: Vitals:   08/29/20 1215 08/29/20 1230  BP: (!) 158/115 (!) 158/115  Pulse:  81  Resp:  18  Temp:    SpO2:  100%   Vitals:   08/29/20 1130 08/29/20 1200 08/29/20 1215 08/29/20 1230  BP: (!) 187/85 (!) 188/108 (!) 158/115 (!) 158/115  Pulse: 72 81  81  Resp: 18   18  Temp:      TempSrc:      SpO2: 100% 100%  100%    General: Pt is alert, awake, not in acute distress Cardiovascular: RRR, S1/S2 +, no rubs, no gallops Respiratory: CTA bilaterally, no wheezing, no rhonchi Abdominal: Soft, NT, ND, bowel sounds + Extremities: no edema, no cyanosis    The results of significant diagnostics from this hospitalization (including imaging, microbiology, ancillary and laboratory) are listed below for reference.     Microbiology: Recent Results (from the past 240 hour(s))  Resp Panel by RT-PCR (Flu A&B, Covid) Nasopharyngeal Swab     Status: None   Collection Time: 08/28/20  8:36 PM   Specimen: Nasopharyngeal Swab; Nasopharyngeal(NP) swabs in vial transport medium  Result Value Ref Range Status   SARS Coronavirus 2 by RT PCR  NEGATIVE NEGATIVE Final    Comment: (NOTE) SARS-CoV-2 target nucleic acids are NOT DETECTED.  The SARS-CoV-2 RNA is generally detectable in upper respiratory specimens during the acute phase of infection. The lowest concentration of SARS-CoV-2 viral copies this assay can detect is 138 copies/mL. A negative result does not preclude SARS-Cov-2 infection and should not be used as the sole basis for treatment or other patient management decisions. A negative result may occur with  improper specimen collection/handling, submission of specimen other than nasopharyngeal swab, presence of viral mutation(s) within the areas targeted by this assay, and inadequate number of viral copies(<138 copies/mL). A negative result must be combined with clinical observations, patient history, and epidemiological information. The expected result is Negative.  Fact Sheet for Patients:  EntrepreneurPulse.com.au  Fact Sheet for Healthcare Providers:  IncredibleEmployment.be  This test is no t yet approved or cleared by the [place] FDA  and  has been authorized for detection and/or diagnosis of SARS-CoV-2 by FDA under an Emergency Use Authorization (EUA). This EUA will remain  in effect (meaning this test can be used) for the duration of the COVID-19 declaration under Section 564(b)(1) of the Act, 21 U.S.C.section 360bbb-3(b)(1), unless the authorization is terminated  or revoked sooner.       Influenza A by PCR NEGATIVE NEGATIVE Final   Influenza B by PCR NEGATIVE NEGATIVE Final    Comment: (NOTE) The Xpert Xpress SARS-CoV-2/FLU/RSV plus assay is intended as an aid in the diagnosis of influenza from Nasopharyngeal swab specimens and should not be used as a sole basis for treatment. Nasal washings and aspirates are unacceptable for Xpert Xpress SARS-CoV-2/FLU/RSV testing.  Fact Sheet for Patients: EntrepreneurPulse.com.au  Fact Sheet for  Healthcare Providers: IncredibleEmployment.be  This test is not yet approved or cleared by the [place] FDA and has been authorized for detection and/or diagnosis of SARS-CoV-2 by FDA under an Emergency Use Authorization (EUA). This EUA will remain in effect (meaning this test can be used) for the duration of the COVID-19 declaration under Section 564(b)(1) of the Act, 21 U.S.C. section 360bbb-3(b)(1), unless the authorization is terminated or revoked.  Performed at [facility], [place] [address] [place], [place] [address]      Labs: BNP (last 3 results) No results for input(s): BNP in the last 8760 hours. Basic Metabolic Panel: Recent Labs  Lab 08/28/20 1405 08/29/20 1351  NA 137 135  K 5.1 3.2*  CL 100 101  CO2 24 24  GLUCOSE 437* 131*  BUN 33* 20  CREATININE 1.66* 0.85  CALCIUM 9.8 9.3   Liver Function Tests: Recent Labs  Lab 08/28/20 1405 08/29/20 1351  AST 17 20  ALT 15 16  ALKPHOS 64 66  BILITOT 0.6 0.5  PROT 7.5 7.3  ALBUMIN 4.2 3.9   Recent Labs  Lab 08/28/20 1405 08/29/20 1351  LIPASE 24 23   No results for input(s): AMMONIA in the last 168 hours. CBC: Recent Labs  Lab 08/28/20 1405 08/29/20 1351  WBC 7.1 5.9  NEUTROABS 3.7 3.8  HGB 12.4* 12.4*  HCT 37.4* 37.0*  MCV 88.2 86.9  PLT 287 285   Cardiac Enzymes: No results for input(s): CKTOTAL, CKMB, CKMBINDEX, TROPONINI in the last 168 hours. BNP: Invalid input(s): POCBNP CBG: Recent Labs  Lab 08/28/20 1933 08/28/20 2204 08/29/20 1219 08/29/20 1359  GLUCAP 166* 90 266* 109*   D-Dimer No results for input(s): DDIMER in the last 72 hours. Hgb A1c No results for input(s): HGBA1C in the last 72 hours. Lipid Profile Recent Labs    08/29/20 0055  CHOL 180  HDL 41  LDLCALC 126*  TRIG 66  CHOLHDL 4.4   Thyroid function studies No results for input(s): TSH, T4TOTAL, T3FREE, THYROIDAB in the last 72 hours.  Invalid input(s):  FREET3 Anemia work up No results for input(s): VITAMINB12, FOLATE, FERRITIN, TIBC, IRON, RETICCTPCT in the last 72 hours. Urinalysis    Component Value Date/Time   COLORURINE YELLOW 08/28/2020 2013   APPEARANCEUR HAZY (A) 08/28/2020 2013   LABSPEC 1.039 (H) 08/28/2020 2013   PHURINE 5.0 08/28/2020 2013   GLUCOSEU >=500 (A) 08/28/2020 2013   HGBUR NEGATIVE 08/28/2020 2013   [place] NEGATIVE 08/28/2020 2013   [place] NEGATIVE 08/28/2020 2013   PROTEINUR 100 (A) 08/28/2020 2013   NITRITE NEGATIVE 08/28/2020 2013   LEUKOCYTESUR NEGATIVE 08/28/2020 2013   Sepsis Labs Invalid input(s): PROCALCITONIN,  WBC,  [place] Microbiology  Recent Results (from the past 240 hour(s))  Resp Panel by RT-PCR (Flu A&B, Covid) Nasopharyngeal Swab     Status: None   Collection Time: 08/28/20  8:36 PM   Specimen: Nasopharyngeal Swab; Nasopharyngeal(NP) swabs in vial transport medium  Result Value Ref Range Status   SARS Coronavirus 2 by RT PCR NEGATIVE NEGATIVE Final    Comment: (NOTE) SARS-CoV-2 target nucleic acids are NOT DETECTED.  The SARS-CoV-2 RNA is generally detectable in upper respiratory specimens during the acute phase of infection. The lowest concentration of SARS-CoV-2 viral copies this assay can detect is 138 copies/mL. A negative result does not preclude SARS-Cov-2 infection and should not be used as the sole basis for treatment or other patient management decisions. A negative result may occur with  improper specimen collection/handling, submission of specimen other than nasopharyngeal swab, presence of viral mutation(s) within the areas targeted by this assay, and inadequate number of viral copies(<138 copies/mL). A negative result must be combined with clinical observations, patient history, and epidemiological information. The expected result is Negative.  Fact Sheet for Patients:  EntrepreneurPulse.com.au  Fact Sheet for Healthcare Providers:   IncredibleEmployment.be  This test is no t yet approved or cleared by the [place] FDA and  has been authorized for detection and/or diagnosis of SARS-CoV-2 by FDA under an Emergency Use Authorization (EUA). This EUA will remain  in effect (meaning this test can be used) for the duration of the COVID-19 declaration under Section 564(b)(1) of the Act, 21 U.S.C.section 360bbb-3(b)(1), unless the authorization is terminated  or revoked sooner.       Influenza A by PCR NEGATIVE NEGATIVE Final   Influenza B by PCR NEGATIVE NEGATIVE Final    Comment: (NOTE) The Xpert Xpress SARS-CoV-2/FLU/RSV plus assay is intended as an aid in the diagnosis of influenza from Nasopharyngeal swab specimens and should not be used as a sole basis for treatment. Nasal washings and aspirates are unacceptable for Xpert Xpress SARS-CoV-2/FLU/RSV testing.  Fact Sheet for Patients: EntrepreneurPulse.com.au  Fact Sheet for Healthcare Providers: IncredibleEmployment.be  This test is not yet approved or cleared by the [place] FDA and has been authorized for detection and/or diagnosis of SARS-CoV-2 by FDA under an Emergency Use Authorization (EUA). This EUA will remain in effect (meaning this test can be used) for the duration of the COVID-19 declaration under Section 564(b)(1) of the Act, 21 U.S.C. section 360bbb-3(b)(1), unless the authorization is terminated or revoked.  Performed at [facility], [place] [address], [place], [place] [address]      Time coordinating discharge: Left AGAINST MEDICAL ADVICE  SIGNED:   Shawna Clamp, MD  [facility] 08/29/2020, 3:46 PM Pager   If 7PM-7AM, please contact night-coverage

## 2020-08-29 NOTE — Progress Notes (Signed)
ANTICOAGULATION CONSULT NOTE - follow up  Pharmacy Consult for heparin Indication:  r/o ACS  Allergies  Allergen Reactions   Trazodone     Other reaction(s): Priapism Pt reports not allergic 06/07/20   Lisinopril Swelling    Angioedema Pt reports not allergic 06/07/20   Omeprazole     Other reaction(s): ANGIOEDEMA OF LIPS, Angioedema of tongue. Pt reports not allergic 06/07/20    Patient Measurements:   Most recent weight in chart from 08/12/20 102.4 lb= 46.5 kg Estimated CrCl is to be ~29 mL/min  Vital Signs: BP: 173/98 (08/13 0530) Pulse Rate: 76 (08/13 0530)  Labs: Recent Labs    08/28/20 1405 08/28/20 1728 08/28/20 1928 08/28/20 2131 08/29/20 0055 08/29/20 0505  HGB 12.4*  --   --   --   --   --   HCT 37.4*  --   --   --   --   --   PLT 287  --   --   --   --   --   HEPARINUNFRC  --   --   --   --   --  <0.10*  CREATININE 1.66*  --   --   --   --   --   TROPONINIHS  --    < > 225* 215* 223*  --    < > = values in this interval not displayed.     CrCl cannot be calculated (Unknown ideal weight.).   Medical History: Past Medical History:  Diagnosis Date   Carpal tunnel syndrome    Depression    Diabetes mellitus without complication (HCC)    Hypertensive urgency 04/14/2020   Neuropathy     Medications:  Scheduled:   insulin aspart  0-20 Units Subcutaneous TID WC   insulin aspart  0-5 Units Subcutaneous QHS   Infusions:   sodium chloride 100 mL/hr at 08/28/20 2144   heparin 550 Units/hr (08/28/20 2142)    Assessment: 65 yo male presenting with nausea, vomiting, and abdominal pain since last night.  No anticoagulation noted PTA.  Noted to be in NSR on EKG, troponin's elevated at 206.  Patient is at risk for cocaine-induced MI.  Pharmacy consulted to dose heparin for possible ACS.  CBC stable.  08/29/2020 HL < 0.1 sub-therapeutic on 550 units/hr No bleeding or interruptions per RN  Goal of Therapy:  Heparin level 0.3-0.7 units/ml Monitor  platelets by anticoagulation protocol: Yes   Plan:  Give heparin 1500 units bolus x1  Increase heparin drip to 750 units/hr F/u 6 hour HL Monitor daily HL and CBC while on heparin F/u cardiology recommendations and ACS w/u   Arley Phenix RPh 08/29/2020, 5:57 AM

## 2020-08-29 NOTE — Progress Notes (Addendum)
PROGRESS NOTE    William Acevedo  MCN:[id number] DOB: 12/22/1955 DOA: 08/28/2020 PCP: [facility]    Brief Narrative: This 65 years old male with PMH significant for polysubstance abuse including cocaine and alcohol abuse, hypertension, diabetes, diabetic neuropathy, history of CVA who was here about 2 months ago .  Patient presented in the ED with complaints of abdominal pain associated with nausea and vomiting.  He also reported having intermittent chest pain. He was found to have blood sugar of 436. He does have a history of chronic abdominal pain,  he was given Zofran and IV fluids.  CT abdomen pelvis no acute finding.  Patient also found to have slightly elevated troponin that could be secondary to AKI.  Cardiology was consulted recommended to start IV heparin.  This could be due to demand ischemia.  Patient was admitted for non-STEMI started on IV heparin.   Assessment & Plan:   Principal Problem:   NSTEMI (non-ST elevated myocardial infarction) (HCC) Active Problems:   Uncomplicated alcohol dependence (HCC)   AKI (acute kidney injury) (HCC)   DM2 (diabetes mellitus, type 2) (HCC)   Elevated troponin   Hypertensive urgency   Cocaine abuse (HCC)  NSTEMI:  Suspected NSTEMI based on enzymes of more than 200.   Despite renal insufficiency his typical troponin was in the tens.   Patient was admitted for possible non-STEMI started on IV heparin. Beta-blockers were on hold due to suspected cocaine abuse.  Monitor closely.   Cardiology consulted recommended to continue current management and repeat echocardiogram. No intervention unless there is significant change on echocardiogram.   Uncontrolled diabetes:  Continue IV hydration, continue sliding scale insulin.  Long-acting insulin will likely be added.   AKI: Most likely prerenal. Continue IV hydration, monitor renal function, avoid nephrotoxic medications.  Hypertension: Avoiding beta-blockers until urine drug  screen comes back.  ACE inhibitor is also will be avoided due to renal insufficiency.  We will monitor patient closely.   Polysubstance abuse: Urine drug screen positive for opiates.  Denied alcohol or recent cocaine use for about a week.  Counseling to be provided.     DVT prophylaxis: Heparin IV Code Status: Full Code Family Communication: No family at bedside Disposition Plan:   Leaving against medical advice.   Consultants:  Cardiology  Procedures:     Antimicrobials:   Anti-infectives (From admission, onward)    None       Subjective: Patient was seen and examined at bedside.  Overnight events noted.   Patient reports having severe back pain asking for stronger pain medications.  Objective: Vitals:   08/29/20 1130 08/29/20 1200 08/29/20 1215 08/29/20 1230  BP: (!) 187/85 (!) 188/108 (!) 158/115 (!) 158/115  Pulse: 72 81  81  Resp: 18   18  Temp:      TempSrc:      SpO2: 100% 100%  100%   No intake or output data in the 24 hours ending 08/29/20 1552 There were no vitals filed for this visit.  Examination:  General exam: Appears comfortable not in any acute distress,  complains of severe pain asking for stronger pain medication Respiratory system: Clear to auscultation. Respiratory effort normal. Cardiovascular system: S1 & S2 heard, RRR. No JVD, murmurs, rubs, gallops or clicks. No pedal edema. Gastrointestinal system: Abdomen is nondistended, soft and nontender. No organomegaly or masses felt. Normal bowel sounds heard. Central nervous system: Alert and oriented. No focal neurological deficits. Extremities: No edema, no cyanosis, no clubbing. Skin:  No rashes, lesions or ulcers Psychiatry: Judgement and insight appear normal. Mood & affect appropriate.     Data Reviewed: I have personally reviewed following labs and imaging studies  CBC: Recent Labs  Lab 08/28/20 1405 08/29/20 1351  WBC 7.1 5.9  NEUTROABS 3.7 3.8  HGB 12.4* 12.4*  HCT 37.4* 37.0*   MCV 88.2 86.9  PLT 287 285   Basic Metabolic Panel: Recent Labs  Lab 08/28/20 1405 08/29/20 1351  NA 137 135  K 5.1 3.2*  CL 100 101  CO2 24 24  GLUCOSE 437* 131*  BUN 33* 20  CREATININE 1.66* 0.85  CALCIUM 9.8 9.3   GFR: CrCl cannot be calculated (Unknown ideal weight.). Liver Function Tests: Recent Labs  Lab 08/28/20 1405 08/29/20 1351  AST 17 20  ALT 15 16  ALKPHOS 64 66  BILITOT 0.6 0.5  PROT 7.5 7.3  ALBUMIN 4.2 3.9   Recent Labs  Lab 08/28/20 1405 08/29/20 1351  LIPASE 24 23   No results for input(s): AMMONIA in the last 168 hours. Coagulation Profile: No results for input(s): INR, PROTIME in the last 168 hours. Cardiac Enzymes: No results for input(s): CKTOTAL, CKMB, CKMBINDEX, TROPONINI in the last 168 hours. BNP (last 3 results) No results for input(s): PROBNP in the last 8760 hours. HbA1C: No results for input(s): HGBA1C in the last 72 hours. CBG: Recent Labs  Lab 08/28/20 1933 08/28/20 2204 08/29/20 1219 08/29/20 1359  GLUCAP 166* 90 266* 109*   Lipid Profile: Recent Labs    08/29/20 0055  CHOL 180  HDL 41  LDLCALC 126*  TRIG 66  CHOLHDL 4.4   Thyroid Function Tests: No results for input(s): TSH, T4TOTAL, FREET4, T3FREE, THYROIDAB in the last 72 hours. Anemia Panel: No results for input(s): VITAMINB12, FOLATE, FERRITIN, TIBC, IRON, RETICCTPCT in the last 72 hours. Sepsis Labs: No results for input(s): PROCALCITON, LATICACIDVEN in the last 168 hours.  Recent Results (from the past 240 hour(s))  Resp Panel by RT-PCR (Flu A&B, Covid) Nasopharyngeal Swab     Status: None   Collection Time: 08/28/20  8:36 PM   Specimen: Nasopharyngeal Swab; Nasopharyngeal(NP) swabs in vial transport medium  Result Value Ref Range Status   SARS Coronavirus 2 by RT PCR NEGATIVE NEGATIVE Final    Comment: (NOTE) SARS-CoV-2 target nucleic acids are NOT DETECTED.  The SARS-CoV-2 RNA is generally detectable in upper respiratory specimens during the  acute phase of infection. The lowest concentration of SARS-CoV-2 viral copies this assay can detect is 138 copies/mL. A negative result does not preclude SARS-Cov-2 infection and should not be used as the sole basis for treatment or other patient management decisions. A negative result may occur with  improper specimen collection/handling, submission of specimen other than nasopharyngeal swab, presence of viral mutation(s) within the areas targeted by this assay, and inadequate number of viral copies(<138 copies/mL). A negative result must be combined with clinical observations, patient history, and epidemiological information. The expected result is Negative.  Fact Sheet for Patients:  BloggerCourse.com  Fact Sheet for Healthcare Providers:  SeriousBroker.it  This test is no t yet approved or cleared by the [place] FDA and  has been authorized for detection and/or diagnosis of SARS-CoV-2 by FDA under an Emergency Use Authorization (EUA). This EUA will remain  in effect (meaning this test can be used) for the duration of the COVID-19 declaration under Section 564(b)(1) of the Act, 21 U.S.C.section 360bbb-3(b)(1), unless the authorization is terminated  or revoked sooner.  Influenza A by PCR NEGATIVE NEGATIVE Final   Influenza B by PCR NEGATIVE NEGATIVE Final    Comment: (NOTE) The Xpert Xpress SARS-CoV-2/FLU/RSV plus assay is intended as an aid in the diagnosis of influenza from Nasopharyngeal swab specimens and should not be used as a sole basis for treatment. Nasal washings and aspirates are unacceptable for Xpert Xpress SARS-CoV-2/FLU/RSV testing.  Fact Sheet for Patients: BloggerCourse.com  Fact Sheet for Healthcare Providers: SeriousBroker.it  This test is not yet approved or cleared by the [place] FDA and has been authorized for detection and/or  diagnosis of SARS-CoV-2 by FDA under an Emergency Use Authorization (EUA). This EUA will remain in effect (meaning this test can be used) for the duration of the COVID-19 declaration under Section 564(b)(1) of the Act, 21 U.S.C. section 360bbb-3(b)(1), unless the authorization is terminated or revoked.  Performed at [facility], [address] [place], [place] [address]      Radiology Studies: CT ABDOMEN PELVIS W CONTRAST  Result Date: 08/28/2020 CLINICAL DATA:  Abdominal distension.  Chronic abdominal pain. EXAM: CT ABDOMEN AND PELVIS WITH CONTRAST TECHNIQUE: Multidetector CT imaging of the abdomen and pelvis was performed using the standard protocol following bolus administration of intravenous contrast. CONTRAST:  83mL OMNIPAQUE IOHEXOL 350 MG/ML SOLN COMPARISON:  07/11/2020 FINDINGS: Lower chest: Included lung bases are clear.  Heart size is normal. Hepatobiliary: No focal liver abnormality is seen. No gallstones, gallbladder wall thickening, or biliary dilatation. Pancreas: Unremarkable. No pancreatic ductal dilatation or surrounding inflammatory changes. Spleen: Normal in size without focal abnormality. Adrenals/Urinary Tract: Unremarkable adrenal glands. Chronic areas of cortical scarring involving the right kidney. Stable small right renal cysts. Multiple punctate nonobstructing stones within the right kidney. No hydronephrosis either kidney. Urinary bladder appears unremarkable. Stomach/Bowel: Stomach is unremarkable. Dilated loops of bowel. Moderate-large volume of stool throughout the colon. No focal bowel wall thickening or inflammatory changes. Vascular/Lymphatic: Prominent atherosclerotic calcifications throughout the aortoiliac axis. No aneurysm. No abdominopelvic lymphadenopathy identified. Reproductive: Prostate is unremarkable. Other: No free fluid. No abdominopelvic fluid collection. No pneumoperitoneum. No abdominal wall hernia. Musculoskeletal: No acute or  significant osseous findings. Degenerative facet arthropathy of the lower lumbar spine. IMPRESSION: 1. No acute abdominopelvic findings. 2. Moderate-large volume of stool throughout the colon. 3. Nonobstructing right nephrolithiasis. 4. Aortic atherosclerosis (ICD10-I70.0). Electronically Signed   By: Duanne Guess D.O.   On: 08/28/2020 18:43   DG Chest Port 1 View  Result Date: 08/28/2020 CLINICAL DATA:  Chest pain with nausea and vomiting for 2 days, initial encounter EXAM: PORTABLE CHEST 1 VIEW COMPARISON:  07/09/2020 FINDINGS: Cardiac shadow is within normal limits. Aortic calcifications are noted. The lungs are well aerated bilaterally. No focal infiltrate or effusion is seen. No bony abnormality is noted. IMPRESSION: No acute abnormality noted. Electronically Signed   By: Alcide Clever M.D.   On: 08/28/2020 20:08     Scheduled Meds: Continuous Infusions:   LOS: 1 day    Time spent: 35 mins    Aziyah Provencal, MD [facility]   If 7PM-7AM, please contact night-coverage

## 2020-08-29 NOTE — ED Notes (Signed)
Pt reporting 10/10 pain due to shingles. MD notified.

## 2020-11-09 ENCOUNTER — Emergency Department (HOSPITAL_COMMUNITY)
Admission: EM | Admit: 2020-11-09 | Discharge: 2020-11-10 | Discharge status: Against Medical Advice | Payer: No Typology Code available for payment source | Attending: Emergency Medicine | Admitting: Emergency Medicine

## 2020-11-09 ENCOUNTER — Encounter (HOSPITAL_COMMUNITY): Payer: Self-pay | Admitting: Emergency Medicine

## 2020-11-09 DIAGNOSIS — R109 Unspecified abdominal pain: Secondary | ICD-10-CM | POA: Diagnosis present

## 2020-11-09 DIAGNOSIS — Z5321 Procedure and treatment not carried out due to patient leaving prior to being seen by health care provider: Secondary | ICD-10-CM | POA: Insufficient documentation

## 2020-11-09 LAB — CBC WITH DIFFERENTIAL/PLATELET
Abs Immature Granulocytes: 0.04 10*3/uL (ref 0.00–0.07)
Basophils Absolute: 0 10*3/uL (ref 0.0–0.1)
Basophils Relative: 0 %
Eosinophils Absolute: 0 10*3/uL (ref 0.0–0.5)
Eosinophils Relative: 0 %
HCT: 32 % — ABNORMAL LOW (ref 39.0–52.0)
Hemoglobin: 10.4 g/dL — ABNORMAL LOW (ref 13.0–17.0)
Immature Granulocytes: 0 %
Lymphocytes Relative: 11 %
Lymphs Abs: 1.4 10*3/uL (ref 0.7–4.0)
MCH: 28.1 pg (ref 26.0–34.0)
MCHC: 32.5 g/dL (ref 30.0–36.0)
MCV: 86.5 fL (ref 80.0–100.0)
Monocytes Absolute: 0.7 10*3/uL (ref 0.1–1.0)
Monocytes Relative: 5 %
Neutro Abs: 10.5 10*3/uL — ABNORMAL HIGH (ref 1.7–7.7)
Neutrophils Relative %: 84 %
Platelets: 515 10*3/uL — ABNORMAL HIGH (ref 150–400)
RBC: 3.7 MIL/uL — ABNORMAL LOW (ref 4.22–5.81)
RDW: 12.2 % (ref 11.5–15.5)
WBC: 12.7 10*3/uL — ABNORMAL HIGH (ref 4.0–10.5)
nRBC: 0 % (ref 0.0–0.2)

## 2020-11-09 LAB — COMPREHENSIVE METABOLIC PANEL
ALT: 18 U/L (ref 0–44)
AST: 18 U/L (ref 15–41)
Albumin: 2.8 g/dL — ABNORMAL LOW (ref 3.5–5.0)
Alkaline Phosphatase: 87 U/L (ref 38–126)
Anion gap: 5 (ref 5–15)
BUN: 12 mg/dL (ref 8–23)
CO2: 31 mmol/L (ref 22–32)
Calcium: 8.6 mg/dL — ABNORMAL LOW (ref 8.9–10.3)
Chloride: 94 mmol/L — ABNORMAL LOW (ref 98–111)
Creatinine, Ser: 0.62 mg/dL (ref 0.61–1.24)
GFR, Estimated: >60 mL/min (ref >60)
Glucose, Bld: 384 mg/dL — ABNORMAL HIGH (ref 70–99)
Potassium: 3.8 mmol/L (ref 3.5–5.1)
Sodium: 130 mmol/L — ABNORMAL LOW (ref 135–145)
Total Bilirubin: 0.5 mg/dL (ref 0.3–1.2)
Total Protein: 7.2 g/dL (ref 6.5–8.1)

## 2020-11-09 LAB — CBG MONITORING, ED: Glucose-Capillary: 425 mg/dL — ABNORMAL HIGH (ref 70–99)

## 2020-11-09 LAB — BETA-HYDROXYBUTYRIC ACID: Beta-Hydroxybutyric Acid: 0.15 mmol/L (ref 0.05–0.27)

## 2020-11-09 LAB — LIPASE, BLOOD: Lipase: 21 U/L (ref 11–51)

## 2020-11-09 NOTE — ED Notes (Signed)
Pt refused updating vital signs

## 2020-11-09 NOTE — ED Provider Notes (Signed)
Emergency Medicine Provider Triage Evaluation Note  William Acevedo , a 65 y.o. male  was evaluated in triage.  Pt complains of lower abdominal pain x2 weeks.  He endorses associated dysuria.  Patient states he has been too weak for the past 2- 3 weeks to take his insulin.  No fever or chills.  It appears uncomfortable in triage.  Review of Systems  Positive: Abdominal pain, dysuria Negative: fever  Physical Exam  BP (!) 160/80   Pulse 95   Temp 98.5 F (36.9 C)   Resp 18   SpO2 99%  Gen:   Awake, no distress   Resp:  Normal effort  MSK:   Moves extremities without difficulty  Other:  Diffuse tenderness; however, patient does not let me get a good exam due to pain  Medical Decision Making  Medically screening exam initiated at 12:43 PM.  Appropriate orders placed.  William Acevedo was informed that the remainder of the evaluation will be completed by another provider, this initial triage assessment does not replace that evaluation, and the importance of remaining in the ED until their evaluation is complete.  Abdominal labs CT abdomen    Mannie Stabile, PA-C 11/09/20 1245    Horton, Clabe Seal, DO 11/09/20 1509

## 2020-11-09 NOTE — ED Notes (Signed)
Registration came back to triage and stated that patient passed out in the lobby. Patient did not have LOC. Patient did eat an entire fried fast food meal and then began vomiting in the lobby. Patient states he fell.

## 2020-11-09 NOTE — ED Triage Notes (Signed)
Per [name], patient from home, c/o muscle weakness worsening x2 weeks. CBG 597. States he has not taken insulin in the last three weeks.

## 2020-11-10 ENCOUNTER — Ambulatory Visit (HOSPITAL_COMMUNITY): Admission: RE | Admit: 2020-11-10 | Payer: Medicare Other | Source: Ambulatory Visit

## 2021-01-07 ENCOUNTER — Other Ambulatory Visit: Payer: Self-pay

## 2021-01-07 ENCOUNTER — Encounter (HOSPITAL_COMMUNITY): Payer: Self-pay

## 2021-01-07 ENCOUNTER — Emergency Department (HOSPITAL_COMMUNITY)
Admission: EM | Admit: 2021-01-07 | Discharge: 2021-01-08 | Disposition: A | Discharge status: Home / Self Care | Payer: Medicare Other | Attending: Emergency Medicine | Admitting: Emergency Medicine

## 2021-01-07 DIAGNOSIS — M25559 Pain in unspecified hip: Secondary | ICD-10-CM | POA: Diagnosis not present

## 2021-01-07 DIAGNOSIS — R1084 Generalized abdominal pain: Secondary | ICD-10-CM | POA: Diagnosis not present

## 2021-01-07 DIAGNOSIS — F17210 Nicotine dependence, cigarettes, uncomplicated: Secondary | ICD-10-CM | POA: Diagnosis not present

## 2021-01-07 DIAGNOSIS — E119 Type 2 diabetes mellitus without complications: Secondary | ICD-10-CM | POA: Insufficient documentation

## 2021-01-07 DIAGNOSIS — Y900 Blood alcohol level of less than 20 mg/100 ml: Secondary | ICD-10-CM | POA: Insufficient documentation

## 2021-01-07 DIAGNOSIS — Z7982 Long term (current) use of aspirin: Secondary | ICD-10-CM | POA: Insufficient documentation

## 2021-01-07 DIAGNOSIS — J189 Pneumonia, unspecified organism: Secondary | ICD-10-CM | POA: Insufficient documentation

## 2021-01-07 DIAGNOSIS — Z794 Long term (current) use of insulin: Secondary | ICD-10-CM | POA: Insufficient documentation

## 2021-01-07 DIAGNOSIS — Z79899 Other long term (current) drug therapy: Secondary | ICD-10-CM | POA: Diagnosis not present

## 2021-01-07 DIAGNOSIS — R109 Unspecified abdominal pain: Secondary | ICD-10-CM

## 2021-01-07 LAB — LIPASE, BLOOD: Lipase: 19 U/L (ref 11–51)

## 2021-01-07 LAB — COMPREHENSIVE METABOLIC PANEL WITH GFR
ALT: 14 U/L (ref 0–44)
AST: 18 U/L (ref 15–41)
Albumin: 3.2 g/dL — ABNORMAL LOW (ref 3.5–5.0)
Alkaline Phosphatase: 53 U/L (ref 38–126)
Anion gap: 9 (ref 5–15)
BUN: 33 mg/dL — ABNORMAL HIGH (ref 8–23)
CO2: 24 mmol/L (ref 22–32)
Calcium: 8.5 mg/dL — ABNORMAL LOW (ref 8.9–10.3)
Chloride: 98 mmol/L (ref 98–111)
Creatinine, Ser: 0.93 mg/dL (ref 0.61–1.24)
GFR, Estimated: >60 mL/min
Glucose, Bld: 313 mg/dL — ABNORMAL HIGH (ref 70–99)
Potassium: 4.3 mmol/L (ref 3.5–5.1)
Sodium: 131 mmol/L — ABNORMAL LOW (ref 135–145)
Total Bilirubin: 0.4 mg/dL (ref 0.3–1.2)
Total Protein: 7.5 g/dL (ref 6.5–8.1)

## 2021-01-07 LAB — CBC
HCT: 30.1 % — ABNORMAL LOW (ref 39.0–52.0)
Hemoglobin: 9.5 g/dL — ABNORMAL LOW (ref 13.0–17.0)
MCH: 27.9 pg (ref 26.0–34.0)
MCHC: 31.6 g/dL (ref 30.0–36.0)
MCV: 88.5 fL (ref 80.0–100.0)
Platelets: 440 10*3/uL — ABNORMAL HIGH (ref 150–400)
RBC: 3.4 MIL/uL — ABNORMAL LOW (ref 4.22–5.81)
RDW: 14.3 % (ref 11.5–15.5)
WBC: 9.7 10*3/uL (ref 4.0–10.5)
nRBC: 0 % (ref 0.0–0.2)

## 2021-01-07 NOTE — ED Triage Notes (Signed)
Patient BIB [facility] from home. Abdominal pain for 3 days, worse tonight. Stabbing sensation. Pain is all over the abdomen. Left hip pain, chronic.    EMS fentanyl CBG 367 99% Room air 104 heart rate

## 2021-01-08 ENCOUNTER — Encounter (HOSPITAL_COMMUNITY): Payer: Self-pay | Admitting: Emergency Medicine

## 2021-01-08 ENCOUNTER — Emergency Department (HOSPITAL_COMMUNITY): Payer: Medicare Other

## 2021-01-08 DIAGNOSIS — R1084 Generalized abdominal pain: Secondary | ICD-10-CM | POA: Diagnosis not present

## 2021-01-08 LAB — ETHANOL: Alcohol, Ethyl (B): <10 mg/dL (ref <10)

## 2021-01-08 MED ORDER — HYOSCYAMINE SULFATE 0.125 MG SL SUBL
0.1250 mg | SUBLINGUAL_TABLET | Freq: Once | SUBLINGUAL | Status: AC
  Administered 2021-01-08: 05:00:00 0.125 mg via ORAL
  Filled 2021-01-08: qty 1

## 2021-01-08 MED ORDER — OXYCODONE-ACETAMINOPHEN 5-325 MG PO TABS
1.0000 | ORAL_TABLET | Freq: Once | ORAL | Status: AC
  Administered 2021-01-08: 06:00:00 1 via ORAL
  Filled 2021-01-08: qty 1

## 2021-01-08 MED ORDER — LIDOCAINE VISCOUS HCL 2 % MT SOLN
15.0000 mL | Freq: Once | OROMUCOSAL | Status: AC
  Administered 2021-01-08: 07:00:00 15 mL via ORAL
  Filled 2021-01-08: qty 15

## 2021-01-08 MED ORDER — HYOSCYAMINE SULFATE 0.125 MG PO TABS: 0.1250 mg | ORAL_TABLET | Freq: Once | ORAL | Status: DC

## 2021-01-08 MED ORDER — LIDOCAINE VISCOUS HCL 2 % MT SOLN: 10.0000 mL | Freq: Four times a day (QID) | OROMUCOSAL | 0 refills | Status: DC | PRN

## 2021-01-08 MED ORDER — SODIUM CHLORIDE 0.9 % IV BOLUS
1000.0000 mL | Freq: Once | INTRAVENOUS | Status: AC
  Administered 2021-01-08: 05:00:00 1000 mL via INTRAVENOUS

## 2021-01-08 MED ORDER — ALUM & MAG HYDROXIDE-SIMETH 200-200-20 MG/5ML PO SUSP
30.0000 mL | Freq: Once | ORAL | Status: AC
  Administered 2021-01-08: 07:00:00 30 mL via ORAL
  Filled 2021-01-08: qty 30

## 2021-01-08 MED ORDER — HYOSCYAMINE SULFATE SL 0.125 MG SL SUBL: 1.0000 | SUBLINGUAL_TABLET | Freq: Four times a day (QID) | SUBLINGUAL | 0 refills | Status: DC | PRN

## 2021-01-08 MED ORDER — PROCHLORPERAZINE EDISYLATE 10 MG/2ML IJ SOLN
10.0000 mg | Freq: Once | INTRAMUSCULAR | Status: AC
  Administered 2021-01-08: 05:00:00 10 mg via INTRAVENOUS
  Filled 2021-01-08: qty 2

## 2021-01-08 NOTE — ED Notes (Signed)
Pt's brother called. Pt ready for discharge. Per pt's brother, eta 55min-1hr until he will be here to pick up pt for discharge.

## 2021-01-08 NOTE — ED Notes (Signed)
ED tech to pt's room to assist pt in getting dressed and wheeled out to ed lobby to wait for brother to come and pick him up.

## 2021-01-08 NOTE — ED Notes (Signed)
No belongings left in room upon dc.

## 2021-01-08 NOTE — ED Provider Notes (Signed)
[place] DEPT Provider Note  CSN: [id number] Arrival date & time: 01/07/21 2242  Chief Complaint(s) Abdominal Pain and Hip Pain  HPI William Acevedo is a 65 y.o. male with a past medical history listed below who was recently admitted to [facility] in November of this year for cryptococcus pneumonia.  Currently on fluconazole.   Abdominal Pain Pain location:  Generalized Pain quality: cramping   Pain radiates to:  Does not radiate Pain severity:  Moderate Onset quality:  Gradual Duration:  2 days Timing:  Intermittent Progression:  Waxing and waning Chronicity:  New Relieved by:  Nothing Worsened by:  Nothing Associated symptoms: diarrhea, nausea and vomiting   Associated symptoms: no anorexia, no chills, no constipation, no cough, no fever and no shortness of breath   Hip Pain This is a chronic problem. Associated symptoms include abdominal pain. Pertinent negatives include no shortness of breath.   Past Medical History Past Medical History:  Diagnosis Date   Carpal tunnel syndrome    Depression    Diabetes mellitus without complication ([place])    Hypertensive urgency 04/14/2020   Neuropathy    Patient Active Problem List   Diagnosis Date Noted   CVA (cerebral vascular accident) ([place]) 07/09/2020   Acute ischemic multifocal multiple vascular territories stroke ([place]) 06/06/2020   Seizure-like activity ([place]) 06/06/2020   Cocaine abuse ([place])    ARF (acute renal failure) ([place]) 04/14/2020   Acute blood loss anemia 04/14/2020   GI bleed 04/14/2020   Nausea & vomiting 85/02/7739   Acute metabolic encephalopathy 28/78/6767   Hypertensive urgency 04/14/2020   Abdominal pain 04/02/2020   Protein-calorie malnutrition, severe 03/27/2020   Cerebral thrombosis with cerebral infarction 03/26/2020   NSTEMI (non-ST elevated myocardial infarction) ([place]) 03/25/2020   Secondary diabetes mellitus with HHNC (hyperglycemia hyperosmolar non-ketotic  coma) ([place]) 03/25/2020   Elevated troponin    Renal infarction [facility])    Cerebrovascular accident (CVA) ([place])    Cocaine use disorder, severe, dependence ([place]) 04/08/2019   Major depressive disorder 04/08/2019   Epiglottitis 04/07/2019   Cocaine use 04/07/2019   Acute encephalopathy 04/07/2019   Overdose 07/06/2015   AKI (acute kidney injury) ([place]) 07/06/2015   Polysubstance abuse ([place]) 07/06/2015   DM2 (diabetes mellitus, type 2) ([place]) 07/06/2015   Dehydration    MDD (major depressive disorder), recurrent episode, mild (HCC)    Uncomplicated alcohol dependence ([place])    Home Medication(s) Prior to Admission medications   Medication Sig Start Date End Date Taking? Authorizing Provider  amLODipine (NORVASC) 10 MG tablet Take 10 mg by mouth daily. 06/26/20  Yes [provider]  aspirin EC 81 MG EC tablet Take 1 tablet (81 mg total) by mouth daily. Swallow whole. 06/09/20  Yes Little Ishikawa, MD  atorvastatin (LIPITOR) 40 MG tablet Take 1 tablet (40 mg total) by mouth at bedtime. 06/09/20  Yes Little Ishikawa, MD  cyclobenzaprine (FLEXERIL) 5 MG tablet Take 1 tablet (5 mg total) by mouth 3 (three) times daily as needed for muscle spasms. 03/30/20  Yes Arrien, Jimmy Picket, MD  docusate sodium (COLACE) 100 MG capsule Take 100 mg by mouth daily as needed for mild constipation.   Yes [provider]  glipiZIDE (GLUCOTROL) 10 MG tablet Take 10 mg by mouth daily before breakfast.   Yes [provider]  hydrALAZINE (APRESOLINE) 50 MG tablet Take 50 mg by mouth 3 (three) times daily.   Yes [provider]  Hyoscyamine Sulfate SL (LEVSIN/SL) 0.125 MG SUBL  Place 1 each under the tongue 4 (four) times daily as needed for up to 5 days. 01/08/21 01/13/21 Yes Neilani Duffee, Grayce Sessions, MD  JARDIANCE 25 MG TABS tablet Take 25 mg by mouth every morning. 12/14/20  Yes [provider]  lidocaine (XYLOCAINE) 2 % solution Use as directed 10 mLs in the mouth or  throat every 6 (six) hours as needed for mouth pain. 01/08/21  Yes Gertude Benito, Grayce Sessions, MD  metoprolol succinate (TOPROL-XL) 50 MG 24 hr tablet Take 25 mg by mouth daily. Take with or immediately following a meal.   Yes [provider]  rosuvastatin (CRESTOR) 40 MG tablet Take 40 mg by mouth daily.   Yes [provider]  Suvorexant (BELSOMRA) 10 MG TABS Take 10 mg by mouth at bedtime.   Yes [provider]  albuterol (PROVENTIL) (2.5 MG/3ML) 0.083% nebulizer solution Take 3 mLs by nebulization every 6 (six) hours as needed. 12/02/20   [provider]  blood glucose meter kit and supplies KIT Dispense based on patient and insurance preference. Use up to four times daily as directed. (FOR ICD-9 250.00, 250.01). 09/13/19   Isla Pence, MD  clopidogrel (PLAVIX) 75 MG tablet Take 1 tablet (75 mg total) by mouth daily. Patient not taking: Reported on 01/08/2021 06/09/20   Little Ishikawa, MD  dicyclomine (BENTYL) 20 MG tablet Take 20 mg by mouth 3 (three) times daily. 07/02/20   [provider]  insulin glargine-yfgn (SEMGLEE) 100 UNIT/ML Pen Inject 12 Units into the skin 2 (two) times daily.    [provider]  insulin lispro (HUMALOG) 100 UNIT/ML KwikPen Inject 0-30 Units into the skin as directed. INJECT SUBCUTANEOUSLY BASED ON BLOOD SUGAR RESULTS WITH MEALS THREE TIMES DAILY PRN FOR HIGH BLOOD SUGAR. MAX OF 30 UNITS IN 24 HOURS    [provider]  Insulin Pen Needle 29G X 12MM MISC Per instructions 04/08/19   [name], MD  ondansetron (ZOFRAN ODT) 4 MG disintegrating tablet Take 1 tablet (4 mg total) by mouth every 8 (eight) hours as needed for nausea or vomiting. Patient taking differently: Take 8 mg by mouth every 8 (eight) hours as needed for nausea or vomiting. 06/09/20   Little Ishikawa, MD  pantoprazole (PROTONIX) 40 MG tablet Take 40 mg by mouth 2 (two) times daily. 06/29/20   [provider]                                                                                                                                     Past Surgical History Past Surgical History:  Procedure Laterality Date   BUBBLE STUDY  03/30/2020   Procedure: BUBBLE STUDY;  Surgeon: Jerline Pain, MD;  Location: [place];  Service: Cardiovascular;;   TEE WITHOUT CARDIOVERSION N/A 03/30/2020   Procedure: TRANSESOPHAGEAL ECHOCARDIOGRAM (TEE);  Surgeon: Jerline Pain, MD;  Location: [facility] ENDOSCOPY;  Service: Cardiovascular;  Laterality: N/A;  TESTICLE TORSION REDUCTION     Family History History reviewed. No pertinent family history.  Social History Social History   Tobacco Use   Smoking status: Every Day    Packs/day: 0.50    Types: Cigarettes   Smokeless tobacco: Never  Substance Use Topics   Alcohol use: Yes   Drug use: Yes    Types: Cocaine   Allergies Lisinopril and Omeprazole  Review of Systems Review of Systems  Constitutional:  Negative for chills and fever.  Respiratory:  Negative for cough and shortness of breath.   Gastrointestinal:  Positive for abdominal pain, diarrhea, nausea and vomiting. Negative for anorexia and constipation.  All other systems are reviewed and are negative for acute change except as noted in the HPI  Physical Exam Vital Signs  I have reviewed the triage vital signs BP (!) 138/91    Pulse 86    Temp 98.9 F (37.2 C) (Oral)    Resp 16    Ht _0  (1.676 m)    Wt 43 kg    SpO2 100%    BMI 15.30 kg/m   Physical Exam Vitals reviewed.  Constitutional:      General: He is not in acute distress.    Appearance: He is well-developed. He is not diaphoretic.  HENT:     Head: Normocephalic and atraumatic.     Right Ear: External ear normal.     Left Ear: External ear normal.     Nose: Nose normal.     Mouth/Throat:     Mouth: Mucous membranes are moist.  Eyes:     General: No scleral icterus.    Conjunctiva/sclera: Conjunctivae normal.  Neck:     Trachea: Phonation  normal.  Cardiovascular:     Rate and Rhythm: Normal rate and regular rhythm.  Pulmonary:     Effort: Pulmonary effort is normal. No respiratory distress.     Breath sounds: No stridor.  Abdominal:     General: There is no distension.     Tenderness: There is no abdominal tenderness. There is no guarding or rebound.  Musculoskeletal:        General: Normal range of motion.     Cervical back: Normal range of motion.  Neurological:     Mental Status: He is alert and oriented to person, place, and time.  Psychiatric:        Behavior: Behavior normal.    ED Results and Treatments Labs (all labs ordered are listed, but only abnormal results are displayed) Labs Reviewed  COMPREHENSIVE METABOLIC PANEL - Abnormal; Notable for the following components:      Result Value   Sodium 131 (*)    Glucose, Bld 313 (*)    BUN 33 (*)    Calcium 8.5 (*)    Albumin 3.2 (*)    All other components within normal limits  CBC - Abnormal; Notable for the following components:   RBC 3.40 (*)    Hemoglobin 9.5 (*)    HCT 30.1 (*)    Platelets 440 (*)    All other components within normal limits  LIPASE, BLOOD  ETHANOL  URINALYSIS, ROUTINE W REFLEX MICROSCOPIC  EKG  EKG Interpretation  Date/Time:    Ventricular Rate:    PR Interval:    QRS Duration:   QT Interval:    QTC Calculation:   R Axis:     Text Interpretation:         Radiology DG Chest 2 View  Result Date: 01/08/2021 CLINICAL DATA:  65 year old male with chest and abdominal pain. EXAM: CHEST - 2 VIEW COMPARISON:  Portable chest 08/28/2020 and earlier. FINDINGS: Semi upright AP and lateral views of the chest. Confluent airspace opacity in the right middle lobe, resembles consolidation on the lateral view although no air bronchograms identified. No superimposed pleural effusion. Mildly lower lung volumes overall.  Calcified aortic atherosclerosis. Other mediastinal contours are within normal limits. Visualized tracheal air column is within normal limits. The left lung appears negative. No pneumothorax. No acute osseous abnormality identified. Negative visible bowel gas. IMPRESSION: 1. Right Middle Lobe Pneumonia. No pleural effusion. Followup PA and lateral chest X-ray is recommended in 3-4 weeks following trial of antibiotic therapy to ensure resolution and exclude underlying malignancy. 2.  Aortic Atherosclerosis (ICD10-I70.0). Electronically Signed   By: Genevie Ann M.D.   On: 01/08/2021 06:34    Pertinent labs & imaging results that were available during my care of the patient were reviewed by me and considered in my medical decision making (see MDM for details).  Medications Ordered in ED Medications  sodium chloride 0.9 % bolus 1,000 mL (1,000 mLs Intravenous New Bag/Given 01/08/21 0514)  prochlorperazine (COMPAZINE) injection 10 mg (10 mg Intravenous Given 01/08/21 0511)  hyoscyamine (LEVSIN SL) SL tablet 0.125 mg (0.125 mg Oral Given 01/08/21 0513)  oxyCODONE-acetaminophen (PERCOCET/ROXICET) 5-325 MG per tablet 1 tablet (1 tablet Oral Given 01/08/21 8416)  alum & mag hydroxide-simeth (MAALOX/MYLANTA) 200-200-20 MG/5ML suspension 30 mL (30 mLs Oral Given 01/08/21 0643)    And  lidocaine (XYLOCAINE) 2 % viscous mouth solution 15 mL (15 mLs Oral Given 01/08/21 6063)                                                                                                                                     Procedures Procedures  (including critical care time)  Medical Decision Making / ED Course I have reviewed the nursing notes for this encounter and the patient's prior records (if available in EHR or on provided paperwork).  William Acevedo was evaluated in Emergency Department on 01/08/2021 for the symptoms described in the history of present illness. He was evaluated in the context of the global COVID-19  pandemic, which necessitated consideration that the patient might be at risk for infection with the SARS-CoV-2 virus that causes COVID-19. Institutional protocols and algorithms that pertain to the evaluation of patients at risk for COVID-19 are in a state of rapid change based on information released by regulatory bodies including the CDC and federal and state organizations. These policies and algorithms were followed during the patient's  care in the ED.     . Intermittent abdominal cramping. Abdomen benign on exam. Patient still passing gas even during examination. Doubt SOB. Known history of right middle lobe cryptococcus pneumonia on fluconazole.  Labs ordered. CXR to assess known pneumonia. Given IVF and meds  Pertinent labs & imaging results that were available during my care of the patient were reviewed by me and considered in my medical decision making:  CBC without leukocytosis.  Mild anemia. Patient with hyperglycemia without evidence of DKA. No renal insufficiency. LFTs within normal limits.  No evidence of biliary obstruction or pancreatitis.  Chest x-ray with known right middle lobe pneumonia.    Doubt serious intra-abdominal inflammatory/infectious process requiring imaging at this time.  Treated symptomatically. Tolerating PO   Final Clinical Impression(s) / ED Diagnoses Final diagnoses:  Pneumonia  Abdominal cramping   The patient appears reasonably screened and/or stabilized for discharge and I doubt any other medical condition or other [facility] requiring further screening, evaluation, or treatment in the ED at this time prior to discharge. Safe for discharge with strict return precautions.  Disposition: Discharge  Condition: Good  I have discussed the results, Dx and Tx plan with the patient/family who expressed understanding and agree(s) with the plan. Discharge instructions discussed at length. The patient/family was given strict return precautions who verbalized  understanding of the instructions. No further questions at time of discharge.    ED Discharge Orders          Ordered    lidocaine (XYLOCAINE) 2 % solution  Every 6 hours PRN        01/08/21 0650    Hyoscyamine Sulfate SL (LEVSIN/SL) 0.125 MG SUBL  4 times daily PRN        01/08/21 0650              Follow Up: [facility], [place] [address] [phone]  Call  to schedule an appointment for close follow up     This chart was dictated using voice recognition software.  Despite best efforts to proofread,  errors can occur which can change the documentation meaning.    Fatima Blank, MD 01/08/21 [phone]

## 2021-01-27 ENCOUNTER — Emergency Department (HOSPITAL_COMMUNITY): Payer: Medicare Other

## 2021-01-27 ENCOUNTER — Other Ambulatory Visit: Payer: Self-pay

## 2021-01-27 ENCOUNTER — Emergency Department (HOSPITAL_COMMUNITY)
Admission: EM | Admit: 2021-01-27 | Discharge: 2021-01-27 | Disposition: A | Discharge status: Home / Self Care | Payer: Medicare Other | Attending: Emergency Medicine | Admitting: Emergency Medicine

## 2021-01-27 DIAGNOSIS — Z7982 Long term (current) use of aspirin: Secondary | ICD-10-CM | POA: Diagnosis not present

## 2021-01-27 DIAGNOSIS — Z20822 Contact with and (suspected) exposure to covid-19: Secondary | ICD-10-CM | POA: Insufficient documentation

## 2021-01-27 DIAGNOSIS — Z79899 Other long term (current) drug therapy: Secondary | ICD-10-CM | POA: Diagnosis not present

## 2021-01-27 DIAGNOSIS — E162 Hypoglycemia, unspecified: Secondary | ICD-10-CM | POA: Insufficient documentation

## 2021-01-27 DIAGNOSIS — R1084 Generalized abdominal pain: Secondary | ICD-10-CM | POA: Diagnosis present

## 2021-01-27 DIAGNOSIS — K5900 Constipation, unspecified: Secondary | ICD-10-CM | POA: Insufficient documentation

## 2021-01-27 LAB — CBG MONITORING, ED
Glucose-Capillary: 48 mg/dL — ABNORMAL LOW (ref 70–99)
Glucose-Capillary: 58 mg/dL — ABNORMAL LOW (ref 70–99)
Glucose-Capillary: 61 mg/dL — ABNORMAL LOW (ref 70–99)
Glucose-Capillary: 130 mg/dL — ABNORMAL HIGH (ref 70–99)
Glucose-Capillary: 152 mg/dL — ABNORMAL HIGH (ref 70–99)

## 2021-01-27 LAB — URINALYSIS, ROUTINE W REFLEX MICROSCOPIC
Bilirubin Urine: NEGATIVE
Glucose, UA: NEGATIVE mg/dL
Hgb urine dipstick: NEGATIVE
Ketones, ur: NEGATIVE mg/dL
Leukocytes,Ua: NEGATIVE
Nitrite: NEGATIVE
Protein, ur: 30 mg/dL — AB
Specific Gravity, Urine: 1.02 (ref 1.005–1.030)
pH: 6.5 (ref 5.0–8.0)

## 2021-01-27 LAB — CBC WITH DIFFERENTIAL/PLATELET
Abs Immature Granulocytes: 0.03 10*3/uL (ref 0.00–0.07)
Basophils Absolute: 0 10*3/uL (ref 0.0–0.1)
Basophils Relative: 0 %
Eosinophils Absolute: 0 10*3/uL (ref 0.0–0.5)
Eosinophils Relative: 0 %
HCT: 29.1 % — ABNORMAL LOW (ref 39.0–52.0)
Hemoglobin: 8.9 g/dL — ABNORMAL LOW (ref 13.0–17.0)
Immature Granulocytes: 0 %
Lymphocytes Relative: 19 %
Lymphs Abs: 1.7 10*3/uL (ref 0.7–4.0)
MCH: 27.4 pg (ref 26.0–34.0)
MCHC: 30.6 g/dL (ref 30.0–36.0)
MCV: 89.5 fL (ref 80.0–100.0)
Monocytes Absolute: 0.5 10*3/uL (ref 0.1–1.0)
Monocytes Relative: 5 %
Neutro Abs: 6.8 10*3/uL (ref 1.7–7.7)
Neutrophils Relative %: 76 %
Platelets: 359 10*3/uL (ref 150–400)
RBC: 3.25 MIL/uL — ABNORMAL LOW (ref 4.22–5.81)
RDW: 13.3 % (ref 11.5–15.5)
WBC: 9 10*3/uL (ref 4.0–10.5)
nRBC: 0 % (ref 0.0–0.2)

## 2021-01-27 LAB — RESP PANEL BY RT-PCR (FLU A&B, COVID) ARPGX2
Influenza A by PCR: NEGATIVE
Influenza B by PCR: NEGATIVE
SARS Coronavirus 2 by RT PCR: NEGATIVE

## 2021-01-27 LAB — COMPREHENSIVE METABOLIC PANEL
ALT: 10 U/L (ref 0–44)
AST: 16 U/L (ref 15–41)
Albumin: 2.9 g/dL — ABNORMAL LOW (ref 3.5–5.0)
Alkaline Phosphatase: 57 U/L (ref 38–126)
Anion gap: 7 (ref 5–15)
BUN: 9 mg/dL (ref 8–23)
CO2: 28 mmol/L (ref 22–32)
Calcium: 9 mg/dL (ref 8.9–10.3)
Chloride: 100 mmol/L (ref 98–111)
Creatinine, Ser: 0.79 mg/dL (ref 0.61–1.24)
GFR, Estimated: >60 mL/min (ref >60)
Glucose, Bld: 77 mg/dL (ref 70–99)
Potassium: 3.9 mmol/L (ref 3.5–5.1)
Sodium: 135 mmol/L (ref 135–145)
Total Bilirubin: 0.3 mg/dL (ref 0.3–1.2)
Total Protein: 7.1 g/dL (ref 6.5–8.1)

## 2021-01-27 LAB — URINALYSIS, MICROSCOPIC (REFLEX): Squamous Epithelial / HPF: NONE SEEN (ref 0–5)

## 2021-01-27 LAB — LIPASE, BLOOD: Lipase: 23 U/L (ref 11–51)

## 2021-01-27 MED ORDER — FENTANYL CITRATE PF 50 MCG/ML IJ SOSY
50.0000 ug | PREFILLED_SYRINGE | Freq: Once | INTRAMUSCULAR | Status: AC
  Administered 2021-01-27: 50 ug via INTRAVENOUS
  Filled 2021-01-27: qty 1

## 2021-01-27 MED ORDER — POLYETHYLENE GLYCOL 3350 17 G PO PACK: 17.0000 g | PACK | Freq: Every day | ORAL | 0 refills | Status: DC | PRN

## 2021-01-27 MED ORDER — DEXTROSE 50 % IV SOLN
1.0000 | Freq: Once | INTRAVENOUS | Status: AC
  Administered 2021-01-27: 50 mL via INTRAVENOUS
  Filled 2021-01-27: qty 50

## 2021-01-27 MED ORDER — INSULIN LISPRO (1 UNIT DIAL) 100 UNIT/ML (KWIKPEN): 0.0000 [IU] | PEN_INJECTOR | SUBCUTANEOUS | 0 refills | Status: DC

## 2021-01-27 NOTE — ED Notes (Signed)
Dinner tray ordered, should be arriving around 8:45 pm

## 2021-01-27 NOTE — ED Notes (Signed)
Patient verbalizes understanding of d/c instructions. Opportunities for questions and answers were provided. Pt d/c from ED and wheeled to lobby where brother is picking him up.

## 2021-01-27 NOTE — ED Provider Notes (Signed)
[place] EMERGENCY DEPARTMENT Provider Note   CSN: [id number] Arrival date & time: 01/27/21  1102     History  Chief Complaint  Patient presents with   Abdominal Pain   Constipation    William Acevedo is a 66 y.o. male.   Abdominal Pain Associated symptoms: constipation, nausea and vomiting   Associated symptoms: no shortness of breath   Constipation Associated symptoms: abdominal pain, nausea and vomiting   Associated symptoms: no back pain   Patient presents abdominal pain and constipation.  History of chronic abdominal pain for months now.  Reviewing notes though thought to be due to cryptococcal infection and potentially to mesenteric ischemia.  Has had typical pain now recently but also has had decreased oral intake.  Reportedly not had a bowel movement in 5 days.  Reportedly home health nurse sentiment.  States he had some vomiting but this is really unchanged.  No fevers.  Patient is dull and diffuse pain.    Home Medications Prior to Admission medications   Medication Sig Start Date End Date Taking? Authorizing Provider  polyethylene glycol (MIRALAX / GLYCOLAX) 17 g packet Take 17 g by mouth daily as needed for moderate constipation. 01/27/21  Yes Davonna Belling, MD  albuterol (PROVENTIL) (2.5 MG/3ML) 0.083% nebulizer solution Take 3 mLs by nebulization every 6 (six) hours as needed. 12/02/20   [provider]  amLODipine (NORVASC) 10 MG tablet Take 10 mg by mouth daily. 06/26/20   [provider]  aspirin EC 81 MG EC tablet Take 1 tablet (81 mg total) by mouth daily. Swallow whole. 06/09/20   Little Ishikawa, MD  atorvastatin (LIPITOR) 40 MG tablet Take 1 tablet (40 mg total) by mouth at bedtime. 06/09/20   Little Ishikawa, MD  blood glucose meter kit and supplies KIT Dispense based on patient and insurance preference. Use up to four times daily as directed. (FOR ICD-9 250.00, 250.01). 09/13/19   Isla Pence, MD   clopidogrel (PLAVIX) 75 MG tablet Take 1 tablet (75 mg total) by mouth daily. Patient not taking: Reported on 01/08/2021 06/09/20   Little Ishikawa, MD  cyclobenzaprine (FLEXERIL) 5 MG tablet Take 1 tablet (5 mg total) by mouth 3 (three) times daily as needed for muscle spasms. 03/30/20   Arrien, Jimmy Picket, MD  dicyclomine (BENTYL) 20 MG tablet Take 20 mg by mouth 3 (three) times daily. 07/02/20   [provider]  docusate sodium (COLACE) 100 MG capsule Take 100 mg by mouth daily as needed for mild constipation.    [provider]  glipiZIDE (GLUCOTROL) 10 MG tablet Take 10 mg by mouth daily before breakfast.    [provider]  hydrALAZINE (APRESOLINE) 50 MG tablet Take 50 mg by mouth 3 (three) times daily.    [provider]  Hyoscyamine Sulfate SL (LEVSIN/SL) 0.125 MG SUBL Place 1 each under the tongue 4 (four) times daily as needed for up to 5 days. 01/08/21 01/13/21  Fatima Blank, MD  insulin glargine-yfgn (SEMGLEE) 100 UNIT/ML Pen Inject 12 Units into the skin 2 (two) times daily.    [provider]  insulin lispro (HUMALOG) 100 UNIT/ML KwikPen Inject 0-30 Units into the skin as directed. INJECT SUBCUTANEOUSLY BASED ON BLOOD SUGAR RESULTS WITH MEALS THREE TIMES DAILY PRN FOR HIGH BLOOD SUGAR. MAX OF 30 UNITS IN 24 HOURS 01/27/21   Davonna Belling, MD  Insulin Pen Needle 29G X 12MM MISC Per instructions 04/08/19   [name], MD  JARDIANCE 25 MG TABS tablet Take 25 mg by mouth every morning. 12/14/20   [provider]  lidocaine (XYLOCAINE) 2 % solution Use as directed 10 mLs in the mouth or throat every 6 (six) hours as needed for mouth pain. 01/08/21   Fatima Blank, MD  metoprolol succinate (TOPROL-XL) 50 MG 24 hr tablet Take 25 mg by mouth daily. Take with or immediately following a meal.    [provider]  ondansetron (ZOFRAN ODT) 4 MG disintegrating tablet Take 1 tablet (4 mg total) by mouth  every 8 (eight) hours as needed for nausea or vomiting. Patient taking differently: Take 8 mg by mouth every 8 (eight) hours as needed for nausea or vomiting. 06/09/20   Little Ishikawa, MD  pantoprazole (PROTONIX) 40 MG tablet Take 40 mg by mouth 2 (two) times daily. 06/29/20   [provider]  rosuvastatin (CRESTOR) 40 MG tablet Take 40 mg by mouth daily.    [provider]  Suvorexant (BELSOMRA) 10 MG TABS Take 10 mg by mouth at bedtime.    [provider]      Allergies    Lisinopril and Omeprazole    Review of Systems   Review of Systems  Constitutional:  Positive for appetite change.  HENT:  Negative for congestion.   Respiratory:  Negative for shortness of breath.   Gastrointestinal:  Positive for abdominal pain, constipation, nausea and vomiting.  Genitourinary:  Negative for flank pain.  Musculoskeletal:  Negative for back pain.  Psychiatric/Behavioral:  Negative for confusion.    Physical Exam Updated Vital Signs BP (!) 144/80    Pulse 84    Temp 98.8 F (37.1 C) (Oral)    Resp 16    SpO2 100%  Physical Exam Vitals and nursing note reviewed.  HENT:     Head: Normocephalic.  Cardiovascular:     Rate and Rhythm: Regular rhythm.  Abdominal:     Comments: Mild diffuse tenderness without rebound or guarding.  No hernia palpated.  No distention.  Skin:    General: Skin is warm.     Capillary Refill: Capillary refill takes less than 2 seconds.  Neurological:     Mental Status: He is alert and oriented to person, place, and time.    ED Results / Procedures / Treatments   Labs (all labs ordered are listed, but only abnormal results are displayed) Labs Reviewed  CBC WITH DIFFERENTIAL/PLATELET - Abnormal; Notable for the following components:      Result Value   RBC 3.25 (*)    Hemoglobin 8.9 (*)    HCT 29.1 (*)    All other components within normal limits  COMPREHENSIVE METABOLIC PANEL - Abnormal; Notable for the following components:    Albumin 2.9 (*)    All other components within normal limits  URINALYSIS, ROUTINE W REFLEX MICROSCOPIC - Abnormal; Notable for the following components:   Protein, ur 30 (*)    All other components within normal limits  URINALYSIS, MICROSCOPIC (REFLEX) - Abnormal; Notable for the following components:   Bacteria, UA RARE (*)    All other components within normal limits  CBG MONITORING, ED - Abnormal; Notable for the following components:   Glucose-Capillary 58 (*)    All other components within normal limits  CBG MONITORING, ED - Abnormal; Notable for the following components:   Glucose-Capillary 48 (*)    All other components within normal limits  CBG MONITORING, ED - Abnormal; Notable for the following components:  Glucose-Capillary 61 (*)    All other components within normal limits  CBG MONITORING, ED - Abnormal; Notable for the following components:   Glucose-Capillary 130 (*)    All other components within normal limits  CBG MONITORING, ED - Abnormal; Notable for the following components:   Glucose-Capillary 152 (*)    All other components within normal limits  RESP PANEL BY RT-PCR (FLU A&B, COVID) [id number]  LIPASE, BLOOD    EKG None  Radiology DG Abdomen Acute W/Chest  Result Date: 01/27/2021 CLINICAL DATA:  Abdominal pain with constipation. EXAM: DG ABDOMEN ACUTE WITH 1 VIEW CHEST COMPARISON:  Chest x-ray 01/08/2021. CT abdomen and pelvis 08/28/2020. FINDINGS: Right middle lobe focal airspace disease has not significantly changed. There is no new focal lung infiltrate, pleural effusion or pneumothorax. Cardiomediastinal silhouette is within normal limits. There is no evidence of dilated bowel loops or free intraperitoneal air. Large amount of stool throughout the colon. No radiopaque calculi or other significant radiographic abnormality is seen. No acute osseous abnormality. IMPRESSION: 1. Stable right middle lobe airspace disease. Follow-up PA and lateral chest x-ray  recommended in 3-4 weeks to confirm complete resolution. 2. Nonobstructive bowel gas pattern.  Large stool burden. Electronically Signed   By: Ronney Asters M.D.   On: 01/27/2021 17:45    Procedures Procedures    Medications Ordered in ED Medications  dextrose 50 % solution 50 mL (50 mLs Intravenous Given 01/27/21 1710)  fentaNYL (SUBLIMAZE) injection 50 mcg (50 mcg Intravenous Given 01/27/21 1939)    ED Course/ Medical Decision Making/ A&P                           Medical Decision Making Problems Addressed: Constipation, unspecified constipation type: acute illness or injury Generalized abdominal pain: chronic illness or injury Hypoglycemia: acute illness or injury   Patient presents constipation.  History chronic abdominal pain.  X-ray done and showed no obstruction but did have very mild stool.  Chronic abdominal pain thought to be secondary to infection that has had and also potentially some mesenteric ischemia.  Lab work reassuring but did have a hyperglycemia.  Had eaten and had been stable and actually went a little high.  Had not been eating during his time before getting here.  States he also needs a little more for short acting insulin and was provided prescription.  Can check his sugar at home.  Lab work reviewed by me.  Hemoglobin at baseline.  Discharge home.  We will give MiraLAX to help with the constipation.  Does not appear to need further abdominal imaging at this time.  Rather benign abdominal exam overall.  Discharge home.        Final Clinical Impression(s) / ED Diagnoses Final diagnoses:  Constipation, unspecified constipation type  Generalized abdominal pain  Hypoglycemia    Rx / DC Orders ED Discharge Orders          Ordered    insulin lispro (HUMALOG) 100 UNIT/ML KwikPen  As directed        01/27/21 2223    polyethylene glycol (MIRALAX / GLYCOLAX) 17 g packet  Daily PRN        01/27/21 2223              Davonna Belling, MD 01/27/21  2324

## 2021-01-27 NOTE — ED Provider Triage Note (Signed)
Emergency Medicine Provider Triage Evaluation Note  William Acevedo , a 66 y.o. male  was evaluated in triage.  Pt complains of abdominal pain and constipation x 5 days. Reports prior hx of cryptococcus, also endorsing nausea,vomiting with some streaks of blood?. Very poor historian. Homehealth nurse was told he needed evaluation.   Review of Systems  Positive: Abdominal pain, constipation Negative: Fever, diarrhea  Physical Exam  There were no vitals taken for this visit. Gen:   Awake, no distress   Resp:  Normal effort  MSK:   Moves extremities without difficulty  Other:    Medical Decision Making  Medically screening exam initiated at 11:04 AM.  Appropriate orders placed.  William Acevedo was informed that the remainder of the evaluation will be completed by another provider, this initial triage assessment does not replace that evaluation, and the importance of remaining in the ED until their evaluation is complete.  Very poor historian. Prior hx of polysubstance abuse.    Claude Manges, PA-C 01/27/21 1109

## 2021-01-27 NOTE — ED Notes (Signed)
PT CBG checked due to pt stating that he feels his sugar dropped. CBG was 58. Triage RN notified. Pt given two orange juice cups, graham crackers, peanut butter and crackers. CBG will be rechecked in 30 mins.

## 2021-01-27 NOTE — ED Triage Notes (Signed)
Pt via [name] for eval of abdominal pain x 10 months. No bowel movement x 5 days. Has home health nurse who evaluated him this am and called EMS. Endorses n/v with blood in vomit.

## 2021-02-12 ENCOUNTER — Other Ambulatory Visit: Payer: Self-pay

## 2021-02-12 ENCOUNTER — Emergency Department (HOSPITAL_COMMUNITY)
Admission: EM | Admit: 2021-02-12 | Discharge: 2021-02-12 | Disposition: A | Discharge status: Home / Self Care | Payer: No Typology Code available for payment source | Attending: Emergency Medicine | Admitting: Emergency Medicine

## 2021-02-12 ENCOUNTER — Emergency Department (HOSPITAL_COMMUNITY): Payer: No Typology Code available for payment source

## 2021-02-12 DIAGNOSIS — Z79899 Other long term (current) drug therapy: Secondary | ICD-10-CM | POA: Diagnosis not present

## 2021-02-12 DIAGNOSIS — R000 Tachycardia, unspecified: Secondary | ICD-10-CM | POA: Diagnosis not present

## 2021-02-12 DIAGNOSIS — G8929 Other chronic pain: Secondary | ICD-10-CM

## 2021-02-12 DIAGNOSIS — Z7982 Long term (current) use of aspirin: Secondary | ICD-10-CM | POA: Diagnosis not present

## 2021-02-12 DIAGNOSIS — I10 Essential (primary) hypertension: Secondary | ICD-10-CM | POA: Diagnosis not present

## 2021-02-12 DIAGNOSIS — R109 Unspecified abdominal pain: Secondary | ICD-10-CM | POA: Insufficient documentation

## 2021-02-12 DIAGNOSIS — R7309 Other abnormal glucose: Secondary | ICD-10-CM | POA: Insufficient documentation

## 2021-02-12 LAB — URINALYSIS, ROUTINE W REFLEX MICROSCOPIC
Bacteria, UA: NONE SEEN
Bilirubin Urine: NEGATIVE
Glucose, UA: >=500 mg/dL — AB
Hgb urine dipstick: NEGATIVE
Ketones, ur: 5 mg/dL — AB
Leukocytes,Ua: NEGATIVE
Nitrite: NEGATIVE
Protein, ur: 100 mg/dL — AB
Specific Gravity, Urine: 1.011 (ref 1.005–1.030)
pH: 9 — ABNORMAL HIGH (ref 5.0–8.0)

## 2021-02-12 LAB — CBC WITH DIFFERENTIAL/PLATELET
Abs Immature Granulocytes: 0.02 10*3/uL (ref 0.00–0.07)
Basophils Absolute: 0 10*3/uL (ref 0.0–0.1)
Basophils Relative: 0 %
Eosinophils Absolute: 0 10*3/uL (ref 0.0–0.5)
Eosinophils Relative: 0 %
HCT: 36.1 % — ABNORMAL LOW (ref 39.0–52.0)
Hemoglobin: 11 g/dL — ABNORMAL LOW (ref 13.0–17.0)
Immature Granulocytes: 0 %
Lymphocytes Relative: 15 %
Lymphs Abs: 1.2 10*3/uL (ref 0.7–4.0)
MCH: 27.4 pg (ref 26.0–34.0)
MCHC: 30.5 g/dL (ref 30.0–36.0)
MCV: 90 fL (ref 80.0–100.0)
Monocytes Absolute: 0.3 10*3/uL (ref 0.1–1.0)
Monocytes Relative: 4 %
Neutro Abs: 6.4 10*3/uL (ref 1.7–7.7)
Neutrophils Relative %: 81 %
Platelets: 421 10*3/uL — ABNORMAL HIGH (ref 150–400)
RBC: 4.01 MIL/uL — ABNORMAL LOW (ref 4.22–5.81)
RDW: 13.6 % (ref 11.5–15.5)
WBC: 7.9 10*3/uL (ref 4.0–10.5)
nRBC: 0 % (ref 0.0–0.2)

## 2021-02-12 LAB — COMPREHENSIVE METABOLIC PANEL
ALT: 17 U/L (ref 0–44)
AST: 25 U/L (ref 15–41)
Albumin: 3.7 g/dL (ref 3.5–5.0)
Alkaline Phosphatase: 51 U/L (ref 38–126)
Anion gap: 11 (ref 5–15)
BUN: 11 mg/dL (ref 8–23)
CO2: 28 mmol/L (ref 22–32)
Calcium: 9.5 mg/dL (ref 8.9–10.3)
Chloride: 99 mmol/L (ref 98–111)
Creatinine, Ser: 0.81 mg/dL (ref 0.61–1.24)
GFR, Estimated: >60 mL/min (ref >60)
Glucose, Bld: 349 mg/dL — ABNORMAL HIGH (ref 70–99)
Potassium: 4.2 mmol/L (ref 3.5–5.1)
Sodium: 138 mmol/L (ref 135–145)
Total Bilirubin: 0.2 mg/dL — ABNORMAL LOW (ref 0.3–1.2)
Total Protein: 8.4 g/dL — ABNORMAL HIGH (ref 6.5–8.1)

## 2021-02-12 LAB — LIPASE, BLOOD: Lipase: 22 U/L (ref 11–51)

## 2021-02-12 LAB — CBG MONITORING, ED: Glucose-Capillary: 330 mg/dL — ABNORMAL HIGH (ref 70–99)

## 2021-02-12 LAB — LACTIC ACID, PLASMA: Lactic Acid, Venous: 1.8 mmol/L (ref 0.5–1.9)

## 2021-02-12 MED ORDER — SODIUM CHLORIDE 0.9 % IV BOLUS
1000.0000 mL | Freq: Once | INTRAVENOUS | Status: AC
Start: 2021-02-12 — End: 2021-02-12
  Administered 2021-02-12: 1000 mL via INTRAVENOUS

## 2021-02-12 MED ORDER — ONDANSETRON HCL 4 MG/2ML IJ SOLN
4.0000 mg | Freq: Once | INTRAMUSCULAR | Status: AC
  Administered 2021-02-12: 4 mg via INTRAVENOUS
  Filled 2021-02-12: qty 2

## 2021-02-12 MED ORDER — AMLODIPINE BESYLATE 5 MG PO TABS
10.0000 mg | ORAL_TABLET | Freq: Once | ORAL | Status: AC
  Administered 2021-02-12: 10 mg via ORAL
  Filled 2021-02-12: qty 2

## 2021-02-12 MED ORDER — OXYCODONE-ACETAMINOPHEN 5-325 MG PO TABS
1.0000 | ORAL_TABLET | Freq: Once | ORAL | Status: AC
  Administered 2021-02-12: 1 via ORAL
  Filled 2021-02-12: qty 1

## 2021-02-12 MED ORDER — METOPROLOL SUCCINATE ER 25 MG PO TB24
50.0000 mg | ORAL_TABLET | Freq: Every day | ORAL | Status: DC
  Administered 2021-02-12: 50 mg via ORAL
  Filled 2021-02-12: qty 2

## 2021-02-12 NOTE — ED Provider Notes (Addendum)
[facility] EMERGENCY DEPARTMENT Provider Note   CSN: [id number] Arrival date & time: 02/12/21  1023     History  Chief Complaint  Patient presents with   Abdominal Pain    William Acevedo is a 66 y.o. male.  Patient with complex past medical history including CVA, chronic abdominal pain with 8 CT scans of the abdomen (amongst others) over the past year, hospitalization in the [facility] system, necrotizing abscess/PNA of lower lobe of right lung with pneumonia, cryptococcus infection, IDDM, HTN -- presents to the emergency department today for acute on chronic abdominal pain.  This is in the "midsection".  He denies fever or chills.  When asked about his abdominal pain he states that he was told it was due to a "lung infection".  When asked about diarrhea and constipation, patient states that he has had both.  Last bowel movement was this morning.  He denies watery or bloody stool.  Denies treatment at home.  EMS had administered 50 mcg of fentanyl, 4 mg of Zofran, and a small fluid bolus.  Patient reported agitated on scene.      Home Medications Prior to Admission medications   Medication Sig Start Date End Date Taking? Authorizing Provider  albuterol (PROVENTIL) (2.5 MG/3ML) 0.083% nebulizer solution Take 3 mLs by nebulization every 6 (six) hours as needed. 12/02/20   [provider]  amLODipine (NORVASC) 10 MG tablet Take 10 mg by mouth daily. 06/26/20   [provider]  aspirin EC 81 MG EC tablet Take 1 tablet (81 mg total) by mouth daily. Swallow whole. 06/09/20   Little Ishikawa, MD  atorvastatin (LIPITOR) 40 MG tablet Take 1 tablet (40 mg total) by mouth at bedtime. 06/09/20   Little Ishikawa, MD  blood glucose meter kit and supplies KIT Dispense based on patient and insurance preference. Use up to four times daily as directed. (FOR ICD-9 250.00, 250.01). 09/13/19   Isla Pence, MD  clopidogrel (PLAVIX) 75 MG tablet Take 1  tablet (75 mg total) by mouth daily. Patient not taking: Reported on 01/08/2021 06/09/20   Little Ishikawa, MD  cyclobenzaprine (FLEXERIL) 5 MG tablet Take 1 tablet (5 mg total) by mouth 3 (three) times daily as needed for muscle spasms. 03/30/20   Arrien, Jimmy Picket, MD  dicyclomine (BENTYL) 20 MG tablet Take 20 mg by mouth 3 (three) times daily. 07/02/20   [provider]  docusate sodium (COLACE) 100 MG capsule Take 100 mg by mouth daily as needed for mild constipation.    [provider]  glipiZIDE (GLUCOTROL) 10 MG tablet Take 10 mg by mouth daily before breakfast.    [provider]  hydrALAZINE (APRESOLINE) 50 MG tablet Take 50 mg by mouth 3 (three) times daily.    [provider]  Hyoscyamine Sulfate SL (LEVSIN/SL) 0.125 MG SUBL Place 1 each under the tongue 4 (four) times daily as needed for up to 5 days. 01/08/21 01/13/21  Fatima Blank, MD  insulin glargine-yfgn (SEMGLEE) 100 UNIT/ML Pen Inject 12 Units into the skin 2 (two) times daily.    [provider]  insulin lispro (HUMALOG) 100 UNIT/ML KwikPen Inject 0-30 Units into the skin as directed. INJECT SUBCUTANEOUSLY BASED ON BLOOD SUGAR RESULTS WITH MEALS THREE TIMES DAILY PRN FOR HIGH BLOOD SUGAR. MAX OF 30 UNITS IN 24 HOURS 01/27/21   Davonna Belling, MD  Insulin Pen Needle 29G X 12MM MISC Per instructions 04/08/19   [name], MD  JARDIANCE 25 MG TABS tablet Take 25 mg by mouth every morning. 12/14/20   [provider]  lidocaine (XYLOCAINE) 2 % solution Use as directed 10 mLs in the mouth or throat every 6 (six) hours as needed for mouth pain. 01/08/21   Fatima Blank, MD  metoprolol succinate (TOPROL-XL) 50 MG 24 hr tablet Take 25 mg by mouth daily. Take with or immediately following a meal.    [provider]  ondansetron (ZOFRAN ODT) 4 MG disintegrating tablet Take 1 tablet (4 mg total) by mouth every 8 (eight) hours as needed for nausea or  vomiting. Patient taking differently: Take 8 mg by mouth every 8 (eight) hours as needed for nausea or vomiting. 06/09/20   Little Ishikawa, MD  pantoprazole (PROTONIX) 40 MG tablet Take 40 mg by mouth 2 (two) times daily. 06/29/20   [provider]  polyethylene glycol (MIRALAX / GLYCOLAX) 17 g packet Take 17 g by mouth daily as needed for moderate constipation. 01/27/21   Davonna Belling, MD  rosuvastatin (CRESTOR) 40 MG tablet Take 40 mg by mouth daily.    [provider]  Suvorexant (BELSOMRA) 10 MG TABS Take 10 mg by mouth at bedtime.    [provider]      Allergies    Lisinopril and Omeprazole    Review of Systems   Review of Systems  Physical Exam Updated Vital Signs BP (!) 187/107 (BP Location: Right Arm)    Pulse (!) 102    Temp 98.5 F (36.9 C) (Oral)    Resp (!) 21    Ht _0  (1.676 m)    Wt 44 kg    SpO2 98%    BMI 15.66 kg/m  Physical Exam Vitals and nursing note reviewed.  Constitutional:      General: He is not in acute distress.    Appearance: He is well-developed.  HENT:     Head: Normocephalic and atraumatic.     Nose:     Comments: Slight blood oozing from right nare, mild. Eyes:     General:        Right eye: No discharge.        Left eye: No discharge.     Conjunctiva/sclera: Conjunctivae normal.  Cardiovascular:     Rate and Rhythm: Regular rhythm. Tachycardia present.     Heart sounds: Normal heart sounds.  Pulmonary:     Effort: Pulmonary effort is normal.     Breath sounds: Normal breath sounds.  Abdominal:     Palpations: Abdomen is soft.     Tenderness: There is no abdominal tenderness. There is no guarding or rebound.     Comments: Patient reports periumbilical pain  Musculoskeletal:     Cervical back: Normal range of motion and neck supple.  Skin:    General: Skin is warm and dry.  Neurological:     Mental Status: He is alert.    ED Results / Procedures / Treatments   Labs (all labs ordered are listed,  but only abnormal results are displayed) Labs Reviewed  CBC WITH DIFFERENTIAL/PLATELET - Abnormal; Notable for the following components:      Result Value   RBC 4.01 (*)    Hemoglobin 11.0 (*)    HCT 36.1 (*)    Platelets 421 (*)    All other components within normal limits  COMPREHENSIVE METABOLIC PANEL - Abnormal; Notable for the following components:   Glucose, Bld 349 (*)    Total Protein 8.4 (*)  Total Bilirubin 0.2 (*)    All other components within normal limits  URINALYSIS, ROUTINE W REFLEX MICROSCOPIC - Abnormal; Notable for the following components:   Color, Urine STRAW (*)    pH 9.0 (*)    Glucose, UA >=500 (*)    Ketones, ur 5 (*)    Protein, ur 100 (*)    All other components within normal limits  LIPASE, BLOOD  LACTIC ACID, PLASMA    EKG None  Radiology No results found.  Procedures Procedures    Medications Ordered in ED Medications  ondansetron (ZOFRAN) injection 4 mg (4 mg Intravenous Given 02/12/21 1054)  sodium chloride 0.9 % bolus 1,000 mL (1,000 mLs Intravenous New Bag/Given 02/12/21 1053)    ED Course/ Medical Decision Making/ A&P                           Medical Decision Making Amount and/or Complexity of Data Reviewed Labs: ordered. Radiology: ordered.  Risk Prescription drug management.   Patient seen and examined. History obtained directly from patient as well as independently reviewed external hospitalization notes from [facility].  Labs/EKG: CBC, CMP, lipase, UA, lactic acid ordered.  Imaging: Acute abdominal series ordered.  Considered CT imaging, however reviewed 8, mostly negative studies over the past year.  Will await labs to determine need for further advanced imaging.  Medications/Fluids: Zofran, fluid bolus ordered.  Most recent vital signs reviewed and are as follows: BP (!) 187/107 (BP Location: Right Arm)    Pulse (!) 102    Temp 98.5 F (36.9 C) (Oral)    Resp (!) 21    Ht _0  (1.676 m)    Wt 44 kg    SpO2  98%    BMI 15.66 kg/m   Initial impression: Acute on chronic abdominal pain, complex past medical history, poor historian.  1:20 PM Reassessment performed. Patient appears comfortable, talkative and responsive.  Abdomen exam stable without rebound or guarding.  Patient is requesting something to "hold off the pain at home".  He becomes agitated when talking about pain management, stating that his primary care doctor does not help him in this regard.  I discussed safety concerns with treating chronic pain from the emergency department.  Patient asks me at one point to call his primary care doctors and tell them to give him pain medication.  I will give the patient a dose of oral Percocet here prior to discharge, but he will need to follow-up as outpatient for management of chronic pain.  Labs and imaging to this point personally reviewed and interpreted including: Clear urine without signs of infection, CBC with normal white blood cell count, mild anemia, CMP with elevated blood sugar with normal anion gap, normal bicarbonate, normal kidney function, normal lipase and lactic acid.  Acute abdominal series with no signs of bowel obstruction or free air, scattered stool, right lung basilar opacity still present.  Reviewed pertinent lab work and imaging with patient at bedside including: Reassuring lab work and imaging.  Most current vital signs reviewed and are as follows: BP (!) 195/111    Pulse 95    Temp 98.5 F (36.9 C) (Oral)    Resp 15    Ht _1  (1.676 m)    Wt 44 kg    SpO2 100%    BMI 15.66 kg/m   Plan: Discharge to home, continue PCP follow-up.   The patient was urged to return to the Emergency Department  immediately with worsening of current symptoms, worsening abdominal pain, persistent vomiting, blood noted in stools, fever, or any other concerns. The patient verbalized understanding.   3:29 PM Prior to d/c, I was asked by RN to talk with patient's brother who is now at bedside.  We  briefly reviewed results.  He is the primary caregiver for the patient who lives in his home.  He relates the patient's difficult medical course over the past year including multiple hospitalizations.  At this point the patient has some limited home health and therapy that comes and works with him during the day.  Family is concerned about the patient's deconditioning and wasting.  He would like help with the process to get the patient placed in rehab.  He states that PCP through the [place] has been of limited help at this point.  I have requested that [facility] talk with the patient and his family about possible resources prior to discharge.    For this patient's complaint of abdominal pain, the following conditions were considered on the differential diagnosis: gastritis/PUD, enteritis/duodenitis, appendicitis, cholelithiasis/cholecystitis, cholangitis, pancreatitis, ruptured viscus, colitis, diverticulitis, proctitis, cystitis, pyelonephritis, ureteral colic, aortic dissection, aortic aneurysm, mesenteric ischemia. Atypical chest etiologies were also considered including ACS, PE, and pneumonia.   The patient's vital signs, pertinent lab work and imaging were reviewed and interpreted as discussed in the ED course. Hospitalization was considered for further testing, treatments, or serial exams/observation. However as patient is well-appearing, has a stable exam, and reassuring studies today, I do not feel that they warrant admission at this time.  In addition he has had 8 CTs of his abdomen and pelvis over the past 1 year that were of limited utility.  This plan was discussed with the patient who verbalizes agreement. He seems reliable and able to return to the Emergency Department with worsening or changing symptoms.   Patient also noted to be hypertensive.  He did not take his medications this morning.  He was given a dose of metoprolol and amlodipine.  We will have patient continue taking his home medications.  No  acute signs of endorgan damage.  He is on Plavix without evidence of GI bleeding.  Patient also noted to be hyperglycemic.  This was treated with IV fluids.  No evidence of DKA today as anion gap and bicarbonate are normal.        Final Clinical Impression(s) / ED Diagnoses Final diagnoses:  Chronic abdominal pain  Hypertension, unspecified type    Rx / DC Orders ED Discharge Orders     None         Carlisle Cater, PA-C 02/12/21 [place], PA-C 02/12/21 [place], DO 02/14/21 [phone]

## 2021-02-12 NOTE — ED Notes (Signed)
Patient was inncot.change linen placed comdom cath.hooked up tosuction canster
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
## 2021-02-12 NOTE — Progress Notes (Signed)
°   02/12/21 1712  TOC ED Mini Assessment  TOC Time spent with patient (minutes): 60  PING Used in TOC Assessment No  Admission or Readmission Diverted Yes  Interventions which prevented an admission or readmission Other (must enter comment) ([facility] resources/rehab resources)  What brought you to the Emergency Department?  N/V  Barriers to Discharge No Barriers Identified  Barrier interventions CSW met with Pt's brother [name] in lobby. Brother is Pt's caregiver and is seeking further resources for assisted living and or rehab resources for Pt. CSW was able to provide some Administrator, sports as well as 12-step materials. CSW also counseled brother to contact [facility] to find out who is Pt's assigned [facility] SW for further assistance. CSW explained funding for assisted living and that [place] has it's own assisted living facilities that [place] can give more information about.  Brother verbalized understanding and appreciation.  Means of departure Publishing copy 1 [place],[name] Brother

## 2021-02-12 NOTE — Discharge Instructions (Signed)
Please read and follow all provided instructions.  Your diagnoses today include:  1. Chronic abdominal pain   2. Hypertension, unspecified type     Tests performed today include: Blood cell counts and platelets: Showed normal infection fighting cells and chronic mild anemia Kidney and liver function tests: Showed normal liver and kidney function tests Pancreas function test (called lipase): Showed normal pancreas test Urine test to look for infection: No signs of infection in the urine X-ray of the chest and abdomen: No signs of blockage, severe constipation, or air in the abdomen Lactic acid test: No problems Vital signs. See below for your results today.   Medications prescribed:  None  Take any prescribed medications only as directed.  Home care instructions:  Follow any educational materials contained in this packet. Please continue home blood pressure medications and follow-up with your doctor for recheck as your blood pressure was high in the emergency department today  Follow-up instructions: Please follow-up with your primary care provider in the next 3 days for further evaluation of your symptoms.    Return instructions:  SEEK IMMEDIATE MEDICAL ATTENTION IF: The pain does not go away or becomes severe  A temperature above 101F develops  Repeated vomiting occurs (multiple episodes)  The pain becomes localized to portions of the abdomen. The right side could possibly be appendicitis. In an adult, the left lower portion of the abdomen could be colitis or diverticulitis.  Blood is being passed in stools or vomit (bright red or black tarry stools)  You develop chest pain, difficulty breathing, dizziness or fainting, or become confused, poorly responsive, or inconsolable (young children) If you have any other emergent concerns regarding your health  Additional Information: Abdominal (belly) pain can be caused by many things. Your caregiver performed an examination and  possibly ordered blood/urine tests and imaging (CT scan, x-rays, ultrasound). Many cases can be observed and treated at home after initial evaluation in the emergency department. Even though you are being discharged home, abdominal pain can be unpredictable. Therefore, you need a repeated exam if your pain does not resolve, returns, or worsens. Most patients with abdominal pain don't have to be admitted to the hospital or have surgery, but serious problems like appendicitis and gallbladder attacks can start out as nonspecific pain. Many abdominal conditions cannot be diagnosed in one visit, so follow-up evaluations are very important.  Your vital signs today were: BP (!) 195/111    Pulse 95    Temp 98.5 F (36.9 C) (Oral)    Resp 15    Ht 5\' 6"  (1.676 m)    Wt 44 kg    SpO2 100%    BMI 15.66 kg/m  If your blood pressure (bp) was elevated above 135/85 this visit, please have this repeated by your doctor within one month. --------------

## 2021-04-03 ENCOUNTER — Other Ambulatory Visit: Payer: Self-pay

## 2021-04-03 ENCOUNTER — Emergency Department (HOSPITAL_COMMUNITY)
Admission: EM | Admit: 2021-04-03 | Discharge: 2021-04-04 | Disposition: A | Discharge status: Home / Self Care | Payer: Medicare Other | Attending: Emergency Medicine | Admitting: Emergency Medicine

## 2021-04-03 ENCOUNTER — Encounter (HOSPITAL_COMMUNITY): Payer: Self-pay

## 2021-04-03 DIAGNOSIS — R1084 Generalized abdominal pain: Secondary | ICD-10-CM | POA: Diagnosis not present

## 2021-04-03 DIAGNOSIS — R109 Unspecified abdominal pain: Secondary | ICD-10-CM | POA: Diagnosis present

## 2021-04-03 DIAGNOSIS — Z79899 Other long term (current) drug therapy: Secondary | ICD-10-CM | POA: Insufficient documentation

## 2021-04-03 LAB — COMPREHENSIVE METABOLIC PANEL
ALT: 11 U/L (ref 0–44)
AST: 14 U/L — ABNORMAL LOW (ref 15–41)
Albumin: 3.8 g/dL (ref 3.5–5.0)
Alkaline Phosphatase: 66 U/L (ref 38–126)
Anion gap: 11 (ref 5–15)
BUN: 13 mg/dL (ref 8–23)
CO2: 31 mmol/L (ref 22–32)
Calcium: 9.3 mg/dL (ref 8.9–10.3)
Chloride: 92 mmol/L — ABNORMAL LOW (ref 98–111)
Creatinine, Ser: 0.82 mg/dL (ref 0.61–1.24)
GFR, Estimated: >60 mL/min (ref >60)
Glucose, Bld: 296 mg/dL — ABNORMAL HIGH (ref 70–99)
Potassium: 3.4 mmol/L — ABNORMAL LOW (ref 3.5–5.1)
Sodium: 134 mmol/L — ABNORMAL LOW (ref 135–145)
Total Bilirubin: 0.3 mg/dL (ref 0.3–1.2)
Total Protein: 8.3 g/dL — ABNORMAL HIGH (ref 6.5–8.1)

## 2021-04-03 LAB — CBC WITH DIFFERENTIAL/PLATELET
Abs Immature Granulocytes: 0.02 10*3/uL (ref 0.00–0.07)
Basophils Absolute: 0 10*3/uL (ref 0.0–0.1)
Basophils Relative: 0 %
Eosinophils Absolute: 0 10*3/uL (ref 0.0–0.5)
Eosinophils Relative: 0 %
HCT: 35.5 % — ABNORMAL LOW (ref 39.0–52.0)
Hemoglobin: 11.2 g/dL — ABNORMAL LOW (ref 13.0–17.0)
Immature Granulocytes: 0 %
Lymphocytes Relative: 32 %
Lymphs Abs: 2.5 10*3/uL (ref 0.7–4.0)
MCH: 27.1 pg (ref 26.0–34.0)
MCHC: 31.5 g/dL (ref 30.0–36.0)
MCV: 86 fL (ref 80.0–100.0)
Monocytes Absolute: 0.6 10*3/uL (ref 0.1–1.0)
Monocytes Relative: 7 %
Neutro Abs: 4.9 10*3/uL (ref 1.7–7.7)
Neutrophils Relative %: 61 %
Platelets: 325 10*3/uL (ref 150–400)
RBC: 4.13 MIL/uL — ABNORMAL LOW (ref 4.22–5.81)
RDW: 13.6 % (ref 11.5–15.5)
WBC: 8 10*3/uL (ref 4.0–10.5)
nRBC: 0 % (ref 0.0–0.2)

## 2021-04-03 LAB — LIPASE, BLOOD: Lipase: 20 U/L (ref 11–51)

## 2021-04-03 MED ORDER — OXYCODONE-ACETAMINOPHEN 5-325 MG PO TABS
2.0000 | ORAL_TABLET | Freq: Once | ORAL | Status: AC
  Administered 2021-04-03: 2 via ORAL
  Filled 2021-04-03: qty 2

## 2021-04-03 NOTE — ED Triage Notes (Signed)
Pt BIB [facility] from home for abdominal pain radiating to the back and chest since today. BG 422.  ?177/96 100 HR  16 RR  98% ?

## 2021-04-03 NOTE — ED Provider Triage Note (Signed)
Emergency Medicine Provider Triage Evaluation Note ? ?William Acevedo , a 66 y.o. male  was evaluated in triage.  Pt complains of abd pain. ? ?Review of Systems  ?Positive: Lower abd pain, n/v/d/c ?Negative: Fever, dysuria ? ?Physical Exam  ?BP (!) 180/148 (BP Location: Left Arm) Comment: Pt will not allow me to remove his coat to obtain BP  Pulse (!) 106   Temp 98.1 ?F (36.7 ?C) (Oral)   Ht 5\' 6"  (1.676 m)   Wt 44 kg   SpO2 100%   BMI 15.66 kg/m?  ?Gen:   Awake, uncomfortable ?Resp:  Normal effort  ?MSK:   Moves extremities without difficulty  ?Other:   ? ?Medical Decision Making  ?Medically screening exam initiated at 9:47 PM.  Appropriate orders placed.  VIDIT BOISSONNEAULT was informed that the remainder of the evaluation will be completed by another provider, this initial triage assessment does not replace that evaluation, and the importance of remaining in the ED until their evaluation is complete. ? ?Report lower abd pain.  Has had it before and now it returns.  Hx of chronic abd pain ?  ?William Bouillon, PA-C ?04/03/21 2152 ? ?

## 2021-04-04 ENCOUNTER — Encounter (HOSPITAL_COMMUNITY): Payer: Self-pay

## 2021-04-04 ENCOUNTER — Emergency Department (HOSPITAL_COMMUNITY): Payer: Medicare Other

## 2021-04-04 DIAGNOSIS — R1084 Generalized abdominal pain: Secondary | ICD-10-CM | POA: Diagnosis not present

## 2021-04-04 LAB — RAPID URINE DRUG SCREEN, HOSP PERFORMED
Amphetamines: NOT DETECTED
Barbiturates: NOT DETECTED
Benzodiazepines: NOT DETECTED
Cocaine: NOT DETECTED
Opiates: POSITIVE — AB
Tetrahydrocannabinol: NOT DETECTED

## 2021-04-04 LAB — URINALYSIS, ROUTINE W REFLEX MICROSCOPIC
Bilirubin Urine: NEGATIVE
Glucose, UA: >=500 mg/dL — AB
Hgb urine dipstick: NEGATIVE
Ketones, ur: NEGATIVE mg/dL
Leukocytes,Ua: NEGATIVE
Nitrite: NEGATIVE
Protein, ur: 100 mg/dL — AB
Specific Gravity, Urine: 1.01 (ref 1.005–1.030)
pH: 8.5 — ABNORMAL HIGH (ref 5.0–8.0)

## 2021-04-04 LAB — URINALYSIS, MICROSCOPIC (REFLEX)
RBC / HPF: NONE SEEN RBC/hpf (ref 0–5)
Squamous Epithelial / HPF: NONE SEEN (ref 0–5)

## 2021-04-04 LAB — LACTIC ACID, PLASMA: Lactic Acid, Venous: 1.7 mmol/L (ref 0.5–1.9)

## 2021-04-04 MED ORDER — IOHEXOL 350 MG/ML SOLN
100.0000 mL | Freq: Once | INTRAVENOUS | Status: AC | PRN
  Administered 2021-04-04: 100 mL via INTRAVENOUS

## 2021-04-04 MED ORDER — POLYETHYLENE GLYCOL 3350 17 GM/SCOOP PO POWD: 17.0000 g | Freq: Two times a day (BID) | ORAL | 0 refills | Status: DC

## 2021-04-04 MED ORDER — DOCUSATE SODIUM 100 MG PO CAPS: 100.0000 mg | ORAL_CAPSULE | Freq: Two times a day (BID) | ORAL | 0 refills | Status: DC

## 2021-04-04 MED ORDER — FENTANYL CITRATE PF 50 MCG/ML IJ SOSY
50.0000 ug | PREFILLED_SYRINGE | Freq: Once | INTRAMUSCULAR | Status: AC
  Administered 2021-04-04: 50 ug via INTRAVENOUS
  Filled 2021-04-04: qty 1

## 2021-04-04 NOTE — ED Provider Notes (Signed)
?[facility]-EMERGENCY DEPT ?[facility] Emergency Department ?Provider Note ?MRN:  [id number]  ?Arrival date & time: 04/04/21    ? ?Chief Complaint   ?Abdominal Pain ?  ?History of Present Illness   ?William Acevedo is a 66 y.o. year-old male presents to the ED with chief complaint of abdominal pain.  Has history of chronic mesenteric ischemia.  He has been seen by vascular surgery at [facility], but was cleared from their standpoint in October 2022.  He reports still having persistent abdominal pains.  Patient denies any fever, chills, nausea, vomiting, or diarrhea.  Denies any successful treatments prior to arrival. ? ?History provided by patient. ? ? ?Review of Systems  ?Pertinent review of systems noted in HPI.  ? ? ?Physical Exam  ? ?Vitals:  ? 04/03/21 2140 04/04/21 0121  ?BP:  (!) 164/81  ?Pulse:  89  ?Resp:  (!) 99  ?Temp: 98.1 ?F (36.7 ?C) 97.7 ?F (36.5 ?C)  ?SpO2:  100%  ?  ?CONSTITUTIONAL: Nontoxic-appearing, NAD ?NEURO:  Alert and oriented x 3, CN 3-12 grossly intact ?EYES:  eyes equal and reactive ?ENT/NECK:  Supple, no stridor  ?CARDIO: Normal rate, regular rhythm, appears well-perfused  ?PULM:  No respiratory distress, CTA B ?GI/GU:  non-distended, generalized abdominal tenderness ?MSK/SPINE:  No gross deformities, no edema, moves all extremities  ?SKIN:  no rash, atraumatic ? ? ?*Additional and/or pertinent findings included in MDM below ? ?Diagnostic and Interventional Summary  ? ? ?Labs Reviewed  ?CBC WITH DIFFERENTIAL/PLATELET - Abnormal; Notable for the following components:  ?    Result Value  ? RBC 4.13 (*)   ? Hemoglobin 11.2 (*)   ? HCT 35.5 (*)   ? All other components within normal limits  ?COMPREHENSIVE METABOLIC PANEL - Abnormal; Notable for the following components:  ? Sodium 134 (*)   ? Potassium 3.4 (*)   ? Chloride 92 (*)   ? Glucose, Bld 296 (*)   ? Total Protein 8.3 (*)   ? AST 14 (*)   ? All other components within normal limits  ?LIPASE, BLOOD  ?URINALYSIS, ROUTINE W REFLEX  MICROSCOPIC  ?RAPID URINE DRUG SCREEN, HOSP PERFORMED  ?  ?CT Angio Abd/Pel W and/or Wo Contrast    (Results Pending)  ?  ?Medications  ?fentaNYL (SUBLIMAZE) injection 50 mcg (has no administration in time range)  ?oxyCODONE-acetaminophen (PERCOCET/ROXICET) 5-325 MG per tablet 2 tablet (2 tablets Oral Given 04/03/21 2156)  ?  ? ?Procedures  /  Critical Care ?Procedures ? ?ED Course and Medical Decision Making  ?I have reviewed the triage vital signs, the nursing notes, and pertinent available records from the EMR. ? ?Complexity of Problems Addressed: ?High Complexity: Acute illness/injury posing a threat to life or bodily function, requiring emergent diagnostic workup, evaluation, and treatment as below. ?Comorbidities affecting this illness/injury include: ?Polysubstance abuse, CVA, renal infarct ?Social Determinants Affecting Care: ?Complexity of care is increased due to drug abuse/addiction. ? ? ?ED Course: ?After considering the following differential, mesenteric ischemia, peptic ulcer, colitis, I ordered labs and CT imaging. ?I personally interpreted the labs which are notable for normal lactic ?I visualized the CT, which is notable for no evidence of acute ischemia or dead bowel and agree with radiologist interpretation.. ? ?  ? ?Consultants: ?No consultations were needed in caring for this patient. ? ?Treatment and Plan: ?Will treat constipation with stool softeners.  No cough or fever, doubt pneumonia.  No evidence of acute ischemia or dead gut.  We will have patient follow-up  with vascular surgery and with his PCP.   ? ?I considered admission due to patient's initial presentation, but after considering the examination and diagnostic results, patient will not require admission and can be discharged with outpatient follow-up. ? ?Patient discussed with attending physician, Dr. Madilyn Hook, who agrees with plan for outpatient follow-up. ? ?Final Clinical Impressions(s) / ED Diagnoses  ? ?  ICD-10-CM   ?1. Generalized  abdominal pain  R10.84   ?  ?  ?ED Discharge Orders   ? ? None  ? ?  ?  ? ? ?Discharge Instructions Discussed with and Provided to Patient:  ? ?Discharge Instructions   ?None ?  ? ?  ?Roxy Horseman, PA-C ?04/04/21 7209 ? ?  ?Tilden Fossa, MD ?04/04/21 [phone] ? ?

## 2021-04-04 NOTE — Discharge Instructions (Signed)
Your CT scan was notable for moderate stenosis of some blood vessels in your abdomen.  You need to follow-up with vascular surgery for this.  Please contact your vascular surgeon.  You should also follow-up with your primary care doctor. ? ?Your CT scan also showed a large amount of stool in your colon.  This could contribute to your pain, I recommend that you use a stool softener. ? ?Please avoid all drugs, alcohol, tobacco, as this can worsen your condition. ?

## 2021-10-28 IMAGING — CT CT ABD-PELV W/ CM
2 of 5 series · 16 of 46 positions shown, 18 images · IV contrast (omnipaque)
Comparison: 03/25/2020

CLINICAL DATA: Lower abdominal pain for 1 month.

EXAM:
CT ABDOMEN AND PELVIS WITH CONTRAST
TECHNIQUE: Multidetector CT imaging of the abdomen and pelvis was performed
using the standard protocol following bolus administration of
intravenous contrast.
CONTRAST:  100mL OMNIPAQUE IOHEXOL 300 MG/ML  SOLN

[Series 2: axial st · axial · 0.68mm/px · z∈[+701,+1086]mm · 13 of 89 slices shown, 15 images]
[im 6/89  soft-tissue]
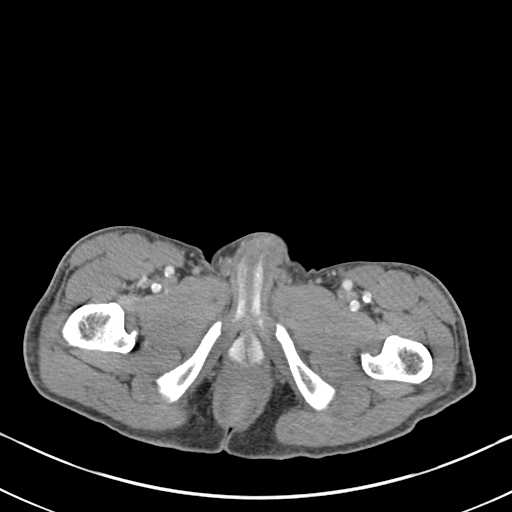
[im 6/89  bone]
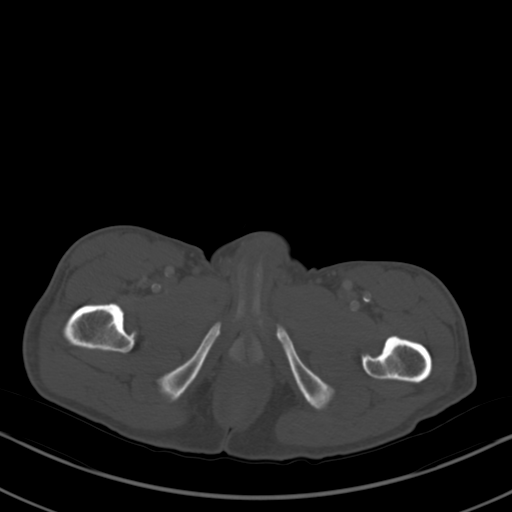
[im 11/89  soft-tissue]
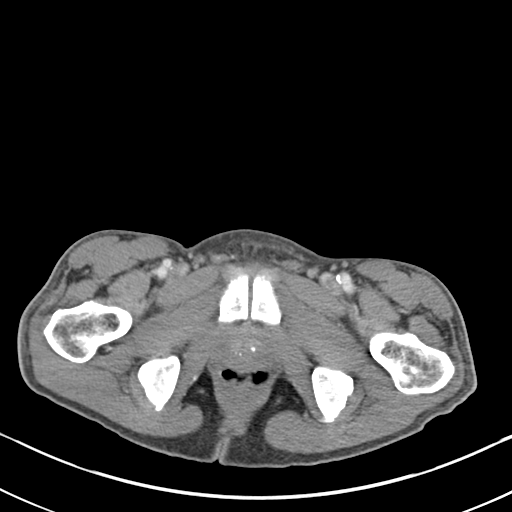
[im 21/89  soft-tissue]
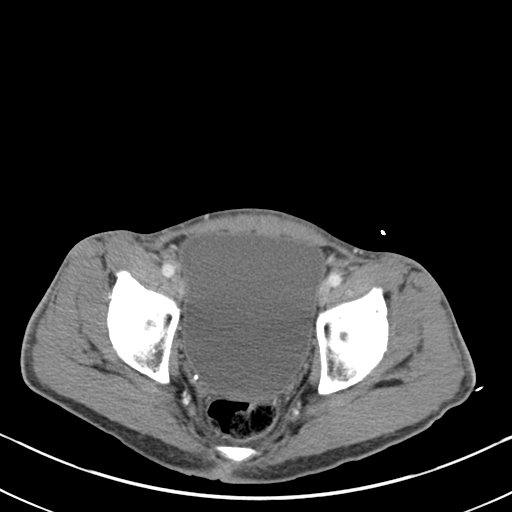
[im 26/89  soft-tissue]
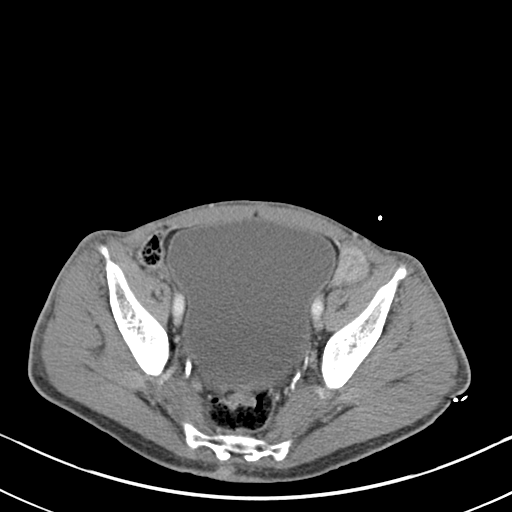
[im 32/89  soft-tissue]
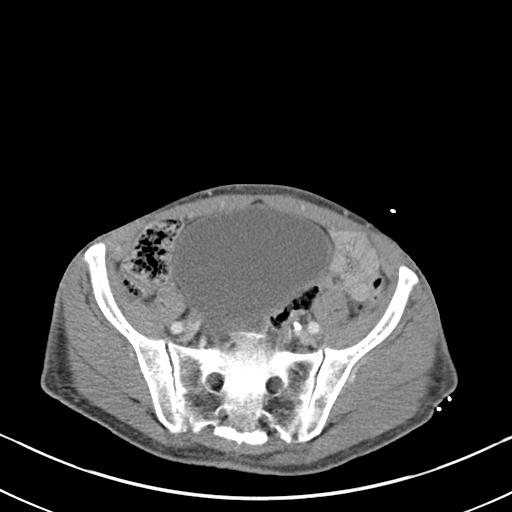
[im 37/89  soft-tissue]
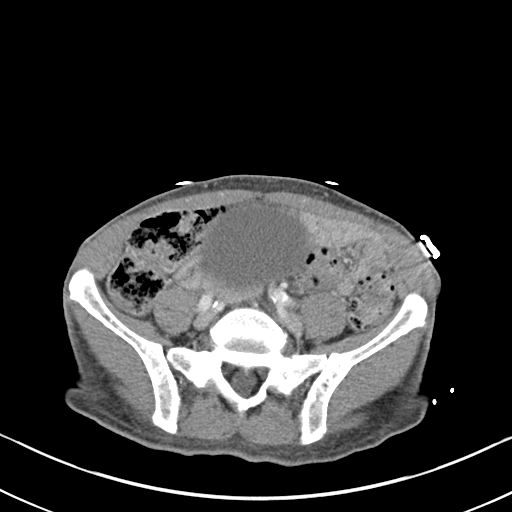
[im 47/89  soft-tissue]
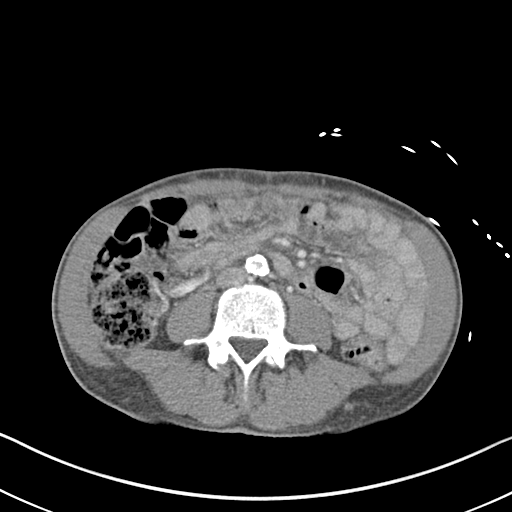
[im 52/89  soft-tissue]
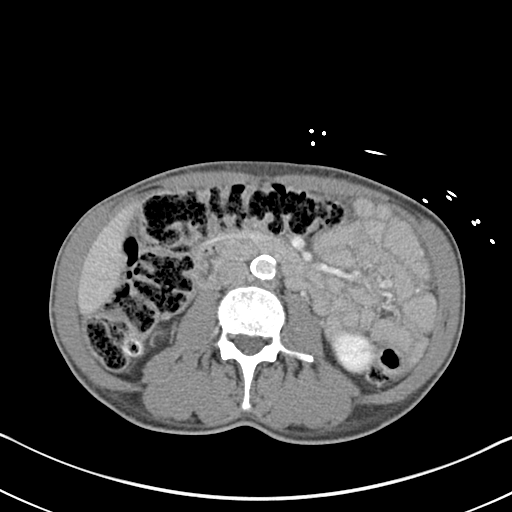
[im 57/89  soft-tissue]
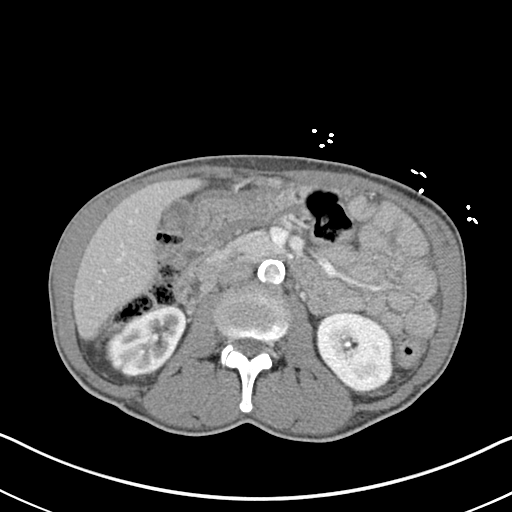
[im 57/89  bone]
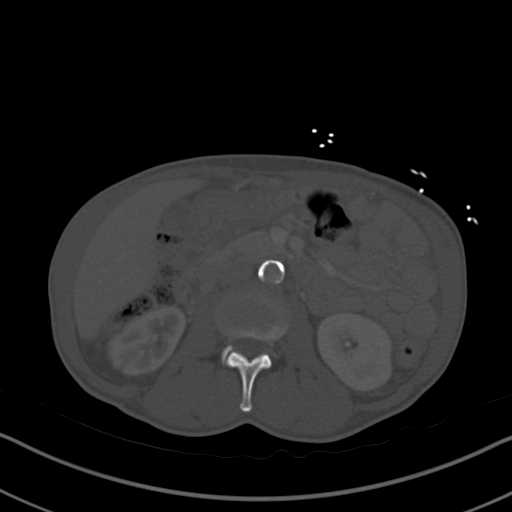
[im 63/89  soft-tissue]
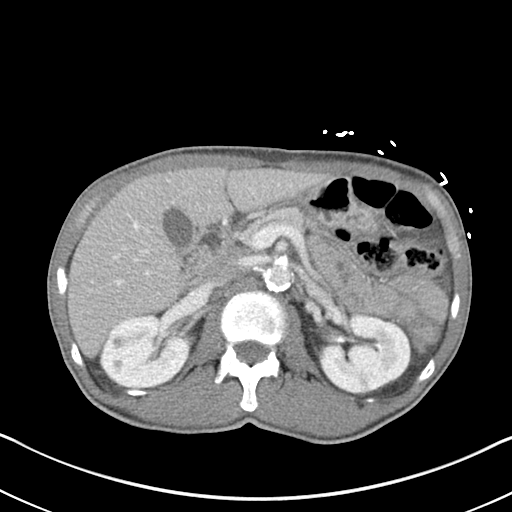
[im 68/89  soft-tissue]
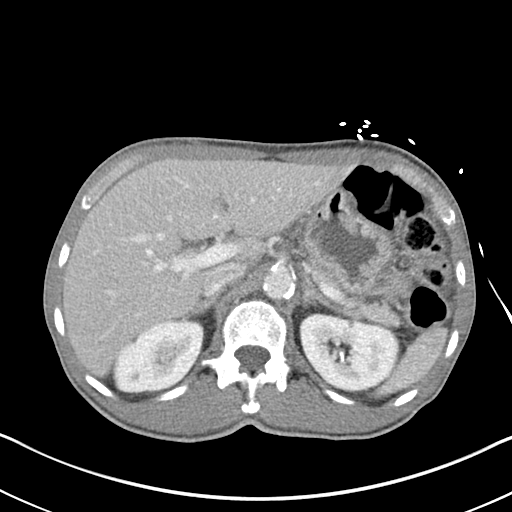
[im 78/89  soft-tissue]
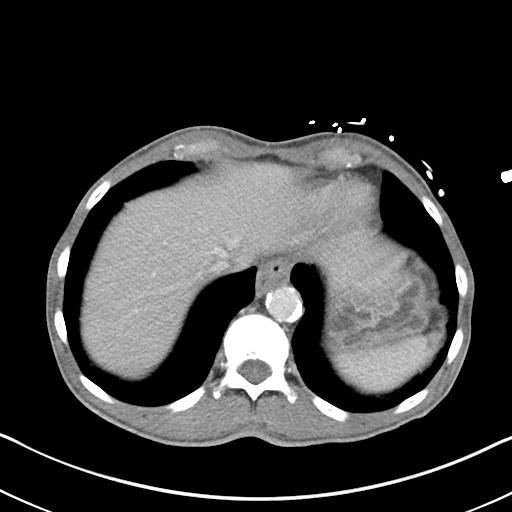
[im 83/89  soft-tissue]
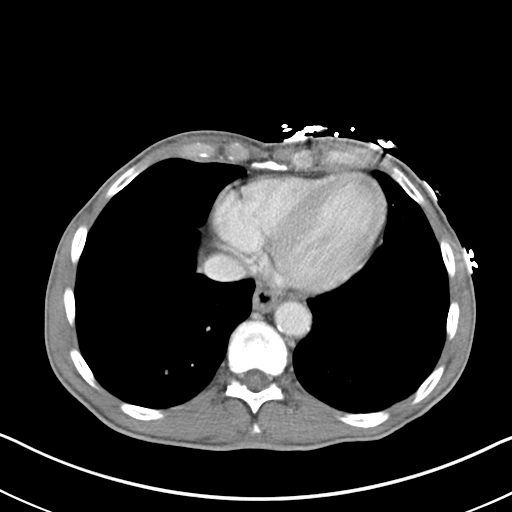

[Series 6: coronal st · coronal · 0.70mm/px · 3 of 125 slices shown]
[im 42/125  soft-tissue]
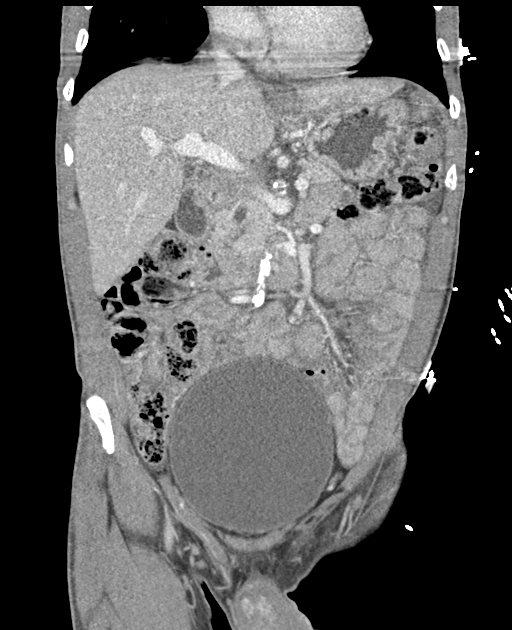
[im 56/125  soft-tissue]
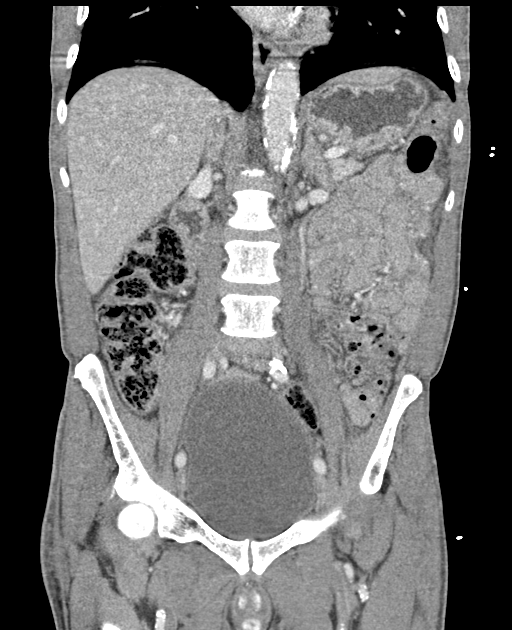
[im 69/125  soft-tissue]
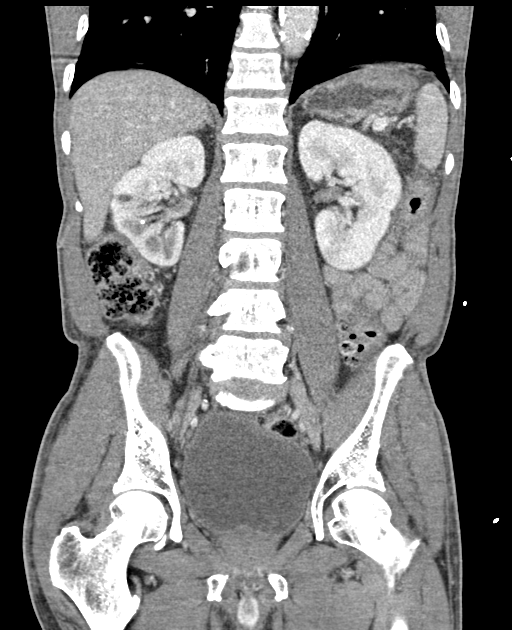

[16 of 46 positions shown; findings below may reference images not displayed]

FINDINGS: Lower chest: No acute abnormality.

Hepatobiliary: No focal hepatic abnormality. Gallbladder
unremarkable.

Pancreas: No focal abnormality or ductal dilatation.

Spleen: No focal abnormality.  Normal size.

Adrenals/Urinary Tract: Areas of cortical thinning and scarring in
the mid and lower pole of the right kidney. Small cysts in the upper
and mid poles of the right kidney. No hydronephrosis. Adrenal glands
unremarkable. Urinary bladder moderately distended, grossly
unremarkable otherwise.

Stomach/Bowel: Moderate stool burden throughout the colon. Stomach,
large and small bowel grossly unremarkable. Normal appendix.

Vascular/Lymphatic: Densely calcified aorta and iliac vessels. No
evidence of aneurysm or adenopathy.

Reproductive: No visible focal abnormality.

Other: No free fluid or free air.

Musculoskeletal: No acute bony abnormality.
IMPRESSION: Heavily calcified aorta and iliac vessels.  No aneurysm.

No acute findings in the abdomen or pelvis.

Areas of cortical thinning and scarring in the mid and lower pole of
the right kidney, stable.

## 2021-10-28 IMAGING — DX DG CHEST 1V PORT
1 series · 1 of 1 positions shown · non-contrast
Comparison: January 18, 2019 chest radiograph; chest CT March 25, 2020

CLINICAL DATA: Weakness and pain

EXAM:
PORTABLE CHEST 1 VIEW

[chest ap]
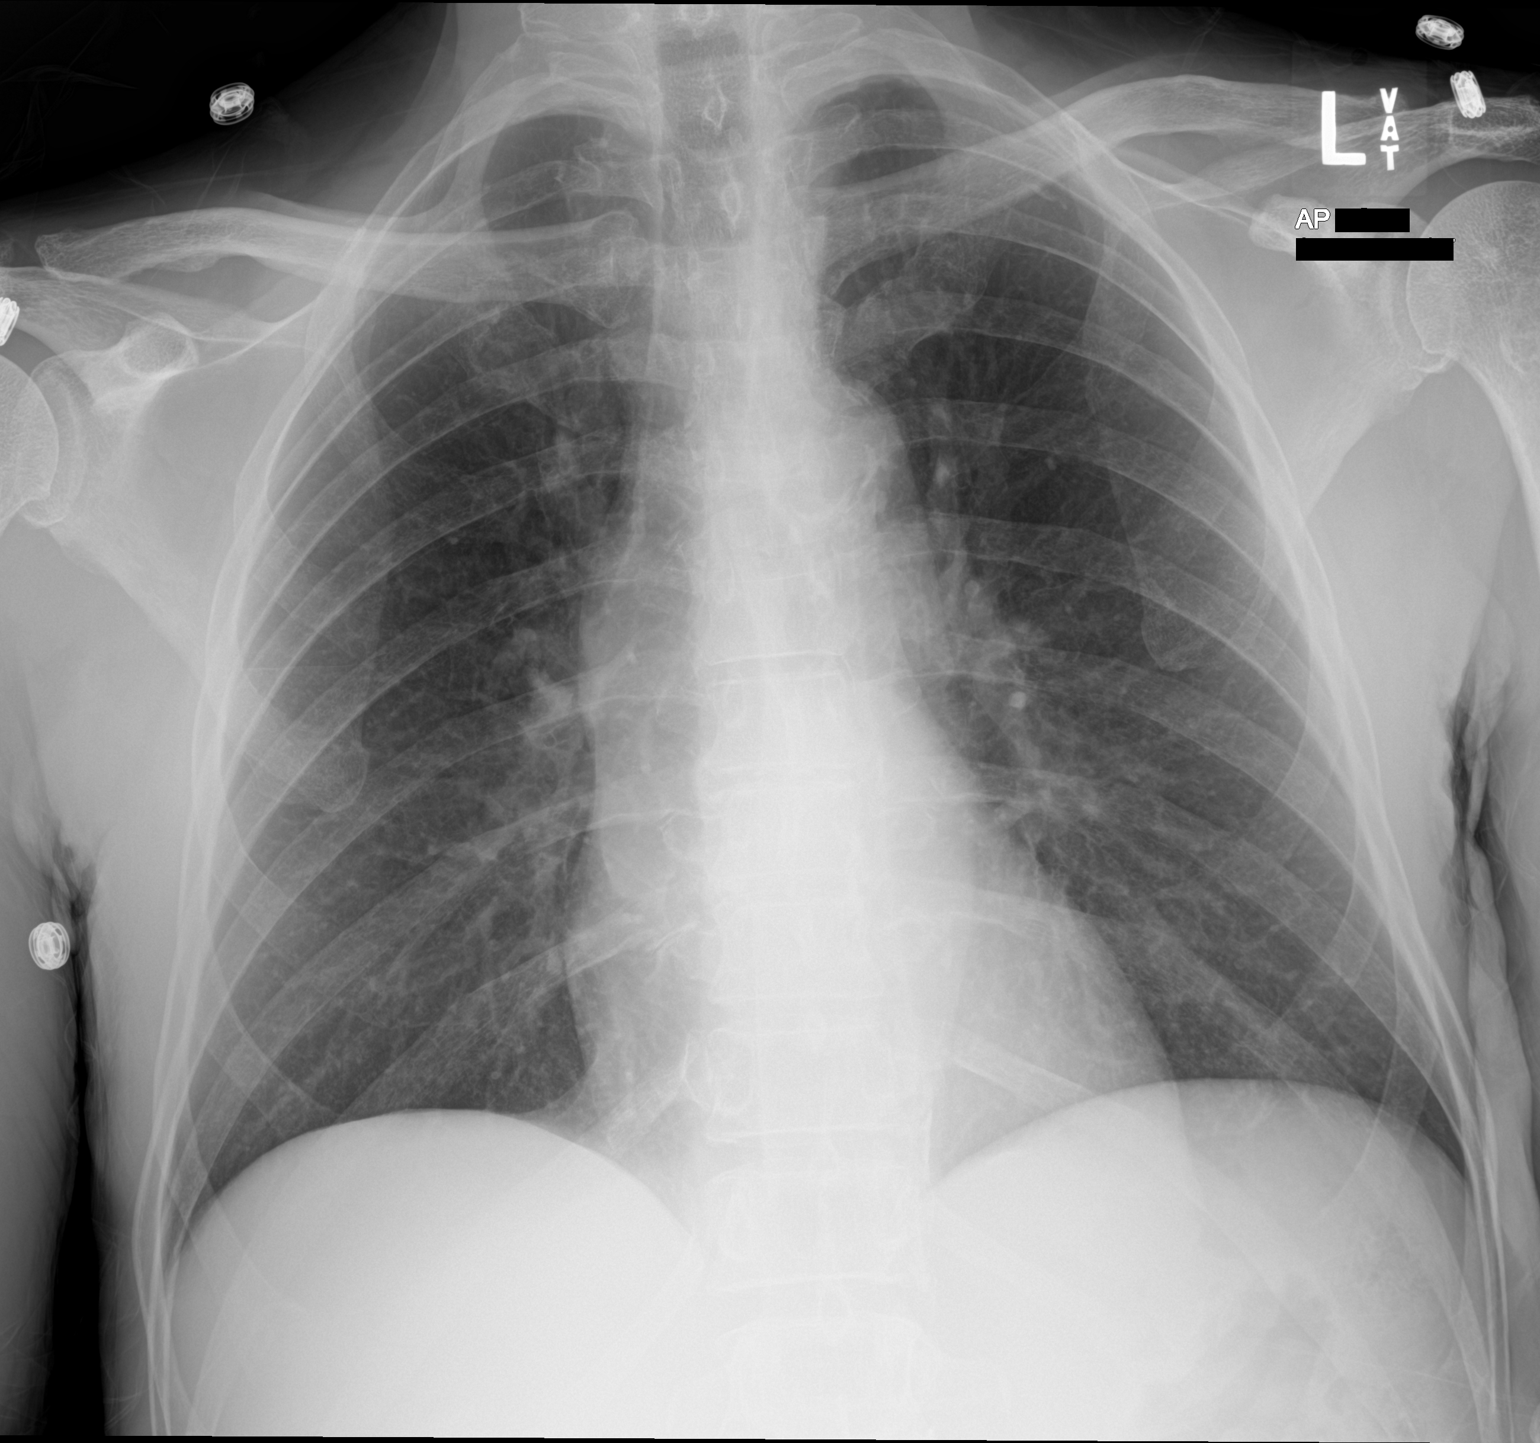

[1 of 1 positions shown; findings below may reference images not displayed]

FINDINGS: Lungs are clear. The heart size and pulmonary vascularity are
normal. No adenopathy. There is aortic atherosclerosis. No bone
lesions.
IMPRESSION: Lungs clear. Heart size normal. Aortic Atherosclerosis
([id number]).

## 2021-11-10 ENCOUNTER — Other Ambulatory Visit: Payer: Self-pay

## 2021-11-10 ENCOUNTER — Emergency Department (HOSPITAL_COMMUNITY)
Admission: EM | Admit: 2021-11-10 | Discharge: 2021-11-11 | Disposition: A | Discharge status: Home / Self Care | Payer: No Typology Code available for payment source | Attending: Emergency Medicine | Admitting: Emergency Medicine

## 2021-11-10 DIAGNOSIS — F209 Schizophrenia, unspecified: Secondary | ICD-10-CM | POA: Insufficient documentation

## 2021-11-10 DIAGNOSIS — F14129 Cocaine abuse with intoxication, unspecified: Secondary | ICD-10-CM | POA: Insufficient documentation

## 2021-11-10 DIAGNOSIS — F1420 Cocaine dependence, uncomplicated: Secondary | ICD-10-CM | POA: Diagnosis not present

## 2021-11-10 DIAGNOSIS — Z1152 Encounter for screening for COVID-19: Secondary | ICD-10-CM | POA: Insufficient documentation

## 2021-11-10 DIAGNOSIS — F4312 Post-traumatic stress disorder, chronic: Secondary | ICD-10-CM | POA: Insufficient documentation

## 2021-11-10 DIAGNOSIS — Z046 Encounter for general psychiatric examination, requested by authority: Secondary | ICD-10-CM | POA: Diagnosis not present

## 2021-11-10 DIAGNOSIS — Z7902 Long term (current) use of antithrombotics/antiplatelets: Secondary | ICD-10-CM | POA: Insufficient documentation

## 2021-11-10 DIAGNOSIS — E119 Type 2 diabetes mellitus without complications: Secondary | ICD-10-CM | POA: Diagnosis not present

## 2021-11-10 DIAGNOSIS — Z7982 Long term (current) use of aspirin: Secondary | ICD-10-CM | POA: Insufficient documentation

## 2021-11-10 DIAGNOSIS — Z7984 Long term (current) use of oral hypoglycemic drugs: Secondary | ICD-10-CM | POA: Insufficient documentation

## 2021-11-10 DIAGNOSIS — F1099 Alcohol use, unspecified with unspecified alcohol-induced disorder: Secondary | ICD-10-CM | POA: Diagnosis not present

## 2021-11-10 DIAGNOSIS — F1220 Cannabis dependence, uncomplicated: Secondary | ICD-10-CM | POA: Insufficient documentation

## 2021-11-10 DIAGNOSIS — F1910 Other psychoactive substance abuse, uncomplicated: Secondary | ICD-10-CM

## 2021-11-10 DIAGNOSIS — I10 Essential (primary) hypertension: Secondary | ICD-10-CM | POA: Diagnosis not present

## 2021-11-10 DIAGNOSIS — F99 Mental disorder, not otherwise specified: Secondary | ICD-10-CM | POA: Diagnosis not present

## 2021-11-10 DIAGNOSIS — Z79899 Other long term (current) drug therapy: Secondary | ICD-10-CM | POA: Diagnosis not present

## 2021-11-10 DIAGNOSIS — F489 Nonpsychotic mental disorder, unspecified: Secondary | ICD-10-CM

## 2021-11-10 DIAGNOSIS — F1090 Alcohol use, unspecified, uncomplicated: Secondary | ICD-10-CM | POA: Diagnosis present

## 2021-11-10 DIAGNOSIS — F602 Antisocial personality disorder: Secondary | ICD-10-CM | POA: Insufficient documentation

## 2021-11-10 DIAGNOSIS — F1110 Opioid abuse, uncomplicated: Secondary | ICD-10-CM | POA: Insufficient documentation

## 2021-11-10 DIAGNOSIS — Z794 Long term (current) use of insulin: Secondary | ICD-10-CM | POA: Diagnosis not present

## 2021-11-10 DIAGNOSIS — F10929 Alcohol use, unspecified with intoxication, unspecified: Secondary | ICD-10-CM | POA: Insufficient documentation

## 2021-11-10 DIAGNOSIS — F259 Schizoaffective disorder, unspecified: Secondary | ICD-10-CM | POA: Diagnosis present

## 2021-11-10 LAB — CBC WITH DIFFERENTIAL/PLATELET
Abs Immature Granulocytes: 0.02 10*3/uL (ref 0.00–0.07)
Basophils Absolute: 0 10*3/uL (ref 0.0–0.1)
Basophils Relative: 0 %
Eosinophils Absolute: 0 10*3/uL (ref 0.0–0.5)
Eosinophils Relative: 0 %
HCT: 29.9 % — ABNORMAL LOW (ref 39.0–52.0)
Hemoglobin: 9.7 g/dL — ABNORMAL LOW (ref 13.0–17.0)
Immature Granulocytes: 0 %
Lymphocytes Relative: 31 %
Lymphs Abs: 1.8 10*3/uL (ref 0.7–4.0)
MCH: 29 pg (ref 26.0–34.0)
MCHC: 32.4 g/dL (ref 30.0–36.0)
MCV: 89.5 fL (ref 80.0–100.0)
Monocytes Absolute: 0.6 10*3/uL (ref 0.1–1.0)
Monocytes Relative: 11 %
Neutro Abs: 3.3 10*3/uL (ref 1.7–7.7)
Neutrophils Relative %: 58 %
Platelets: 249 10*3/uL (ref 150–400)
RBC: 3.34 MIL/uL — ABNORMAL LOW (ref 4.22–5.81)
RDW: 13.3 % (ref 11.5–15.5)
WBC: 5.8 10*3/uL (ref 4.0–10.5)
nRBC: 0 % (ref 0.0–0.2)

## 2021-11-10 LAB — COMPREHENSIVE METABOLIC PANEL
ALT: 15 U/L (ref 0–44)
AST: 18 U/L (ref 15–41)
Albumin: 3.5 g/dL (ref 3.5–5.0)
Alkaline Phosphatase: 55 U/L (ref 38–126)
Anion gap: 9 (ref 5–15)
BUN: 9 mg/dL (ref 8–23)
CO2: 31 mmol/L (ref 22–32)
Calcium: 8.3 mg/dL — ABNORMAL LOW (ref 8.9–10.3)
Chloride: 97 mmol/L — ABNORMAL LOW (ref 98–111)
Creatinine, Ser: 0.95 mg/dL (ref 0.61–1.24)
GFR, Estimated: >60 mL/min (ref >60)
Glucose, Bld: 312 mg/dL — ABNORMAL HIGH (ref 70–99)
Potassium: 3.4 mmol/L — ABNORMAL LOW (ref 3.5–5.1)
Sodium: 137 mmol/L (ref 135–145)
Total Bilirubin: 0.4 mg/dL (ref 0.3–1.2)
Total Protein: 6.9 g/dL (ref 6.5–8.1)

## 2021-11-10 LAB — RAPID URINE DRUG SCREEN, HOSP PERFORMED
Amphetamines: NOT DETECTED
Barbiturates: NOT DETECTED
Benzodiazepines: NOT DETECTED
Cocaine: POSITIVE — AB
Opiates: NOT DETECTED
Tetrahydrocannabinol: NOT DETECTED

## 2021-11-10 LAB — CBG MONITORING, ED: Glucose-Capillary: 315 mg/dL — ABNORMAL HIGH (ref 70–99)

## 2021-11-10 LAB — RESP PANEL BY RT-PCR (FLU A&B, COVID) ARPGX2
Influenza A by PCR: NEGATIVE
Influenza B by PCR: NEGATIVE
SARS Coronavirus 2 by RT PCR: NEGATIVE

## 2021-11-10 LAB — ETHANOL: Alcohol, Ethyl (B): <10 mg/dL (ref <10)

## 2021-11-10 MED ORDER — MIRTAZAPINE 7.5 MG PO TABS
7.5000 mg | ORAL_TABLET | Freq: Every day | ORAL | Status: DC
  Administered 2021-11-10: 7.5 mg via ORAL
  Filled 2021-11-10: qty 1

## 2021-11-10 MED ORDER — GLIPIZIDE 10 MG PO TABS
10.0000 mg | ORAL_TABLET | Freq: Every day | ORAL | Status: DC
  Filled 2021-11-10 (×2): qty 1

## 2021-11-10 MED ORDER — OLANZAPINE 5 MG PO TABS
5.0000 mg | ORAL_TABLET | Freq: Every day | ORAL | Status: DC
  Administered 2021-11-10: 5 mg via ORAL
  Filled 2021-11-10: qty 1

## 2021-11-10 MED ORDER — ROSUVASTATIN CALCIUM 20 MG PO TABS
40.0000 mg | ORAL_TABLET | Freq: Every day | ORAL | Status: DC
  Filled 2021-11-10: qty 2

## 2021-11-10 MED ORDER — SUVOREXANT 10 MG PO TABS: 10.0000 mg | ORAL_TABLET | Freq: Every day | ORAL | Status: DC

## 2021-11-10 MED ORDER — AMLODIPINE BESYLATE 5 MG PO TABS
10.0000 mg | ORAL_TABLET | Freq: Every day | ORAL | Status: DC
  Filled 2021-11-10 (×2): qty 2

## 2021-11-10 MED ORDER — ASPIRIN 81 MG PO TBEC
81.0000 mg | DELAYED_RELEASE_TABLET | Freq: Every day | ORAL | Status: DC
  Filled 2021-11-10 (×2): qty 1

## 2021-11-10 MED ORDER — HYDRALAZINE HCL 25 MG PO TABS: 50.0000 mg | ORAL_TABLET | Freq: Three times a day (TID) | ORAL | Status: DC

## 2021-11-10 MED ORDER — ATORVASTATIN CALCIUM 40 MG PO TABS: 40.0000 mg | ORAL_TABLET | Freq: Every day | ORAL | Status: DC

## 2021-11-10 MED ORDER — NICOTINE 21 MG/24HR TD PT24
21.0000 mg | MEDICATED_PATCH | Freq: Once | TRANSDERMAL | Status: DC
  Filled 2021-11-10: qty 1

## 2021-11-10 MED ORDER — EMPAGLIFLOZIN 25 MG PO TABS
25.0000 mg | ORAL_TABLET | Freq: Every morning | ORAL | Status: DC
  Filled 2021-11-10: qty 1

## 2021-11-10 MED ORDER — INSULIN ASPART 100 UNIT/ML IJ SOLN
0.0000 [IU] | Freq: Every day | INTRAMUSCULAR | Status: DC
  Filled 2021-11-10: qty 0.05

## 2021-11-10 MED ORDER — PANTOPRAZOLE SODIUM 40 MG PO TBEC
40.0000 mg | DELAYED_RELEASE_TABLET | Freq: Every day | ORAL | Status: DC
  Administered 2021-11-11: 40 mg via ORAL
  Filled 2021-11-10: qty 1

## 2021-11-10 MED ORDER — INSULIN ASPART 100 UNIT/ML IJ SOLN
0.0000 [IU] | Freq: Three times a day (TID) | INTRAMUSCULAR | Status: DC
  Administered 2021-11-11: 15 [IU] via SUBCUTANEOUS
  Filled 2021-11-10: qty 0.15

## 2021-11-10 MED ORDER — TRAZODONE HCL 100 MG PO TABS
100.0000 mg | ORAL_TABLET | Freq: Every day | ORAL | Status: DC
  Administered 2021-11-10: 100 mg via ORAL
  Filled 2021-11-10: qty 1

## 2021-11-10 NOTE — ED Notes (Signed)
Pt. Refused to let tech take his blood sugar and RN.

## 2021-11-10 NOTE — Progress Notes (Signed)
CSW contacted [facility] who stated that there will be possible beds available tomorrow AM. CSW started the [facility] referral and left notice for AM CSW to follow-up.    Mariea Clonts, MSW, LCSW-A  10:24 PM 11/10/2021

## 2021-11-10 NOTE — ED Notes (Signed)
Wants a cigarette, nicotine patch requested.

## 2021-11-10 NOTE — ED Notes (Signed)
Pt started walking back and forth in the hallway to the nurse's station, getting agitated. Yelled at this nurse asking for his clothes states he wants to leave. Explained to the patient that he is IVCd-- patient states "You are a liar. You said you will talk to the doctor so I can go home". Explained to patient that he is not being discharge at this time and will still need to be evaluated by the behavioral team. Security called for assistance, they're now talking to the patient.

## 2021-11-10 NOTE — ED Notes (Addendum)
error 

## 2021-11-10 NOTE — TOC CAGE-AID Note (Signed)
Transition of Care [facility]) - CAGE-AID Screening   Patient Details  Name: William Acevedo MRN: [id number] Date of Birth: May 07, 1955  Transition of Care [facility]) CM/SW Contact:    Amarion Portell C Tarpley-Carter, LCSWA Phone Number: 11/10/2021, 5:57 PM   Clinical Narrative: Pt participated in [place].  Pt stated he does use substance.  Pt stated cocaine was his drug of choice.  Pt was offered resources, due to usage of substance.  Pt accepted CSW's resources.  Bentley Haralson Tarpley-Carter, MSW, LCSW-A Pronouns:  She/Her/Hers [facility] Clinical Social Worker Direct Number:  [phone] [email or web address]       CAGE-AID Screening:    Have You Ever Felt You Ought to Cut Down on Your Drinking or Drug Use?: No Have People Annoyed You By [employer] Your Drinking Or Drug Use?: Yes Have You Felt Bad Or Guilty About Your Drinking Or Drug Use?: Yes Have You Ever Had a Drink or Used Drugs First Thing In The Morning to Steady Your Nerves or to Get Rid of a Hangover?: No CAGE-AID Score: 2  Substance Abuse Education Offered: Yes  Substance abuse interventions: Scientist, clinical (histocompatibility and immunogenetics)

## 2021-11-10 NOTE — ED Triage Notes (Signed)
Pt BIB [place] [facility], brother Marveen Reeks pt due to poor hygiene, "swallowed" cocaine and not sleeping. Pt denies swallowing cocaine, he admitted to smoking. Calm and cooperative per PD. Uses cane at baseline. No SI/ HI.  Dr. Ronnald Nian at bedside during triage.

## 2021-11-10 NOTE — ED Provider Notes (Signed)
[facility] DEPT Provider Note   CSN: [id number] Arrival date & time: 11/10/21  1611     History  Chief Complaint  Patient presents with   IVC    William Acevedo is a 66 y.o. male.  Patient here with IVC.  Patient admits to polysubstance abuse mostly crack cocaine which he smokes.  Sounds like per IVC he is having some aggressive behavior, hallucinations, not taking care of himself.  Brother thinks that he has mental health issue but he does not know what the diagnosis is.  Patient has history of diabetes, hypertension.  He denies any suicidal homicidal ideation.  He denies any recent drug use.  Overall, denies chest pain, shortness of breath, abdominal pain, nausea, vomiting, diarrhea.  The history is provided by the patient.       Home Medications Prior to Admission medications   Medication Sig Start Date End Date Taking? Authorizing Provider  traZODone (DESYREL) 100 MG tablet Take 100 mg by mouth at bedtime. 10/01/21  Yes [provider]  albuterol (PROVENTIL) (2.5 MG/3ML) 0.083% nebulizer solution Take 3 mLs by nebulization every 6 (six) hours as needed. 12/02/20   [provider]  amLODipine (NORVASC) 10 MG tablet Take 10 mg by mouth daily. 06/26/20   [provider]  aspirin EC 81 MG EC tablet Take 1 tablet (81 mg total) by mouth daily. Swallow whole. 06/09/20   Little Ishikawa, MD  atorvastatin (LIPITOR) 40 MG tablet Take 1 tablet (40 mg total) by mouth at bedtime. 06/09/20   Little Ishikawa, MD  blood glucose meter kit and supplies KIT Dispense based on patient and insurance preference. Use up to four times daily as directed. (FOR ICD-9 250.00, 250.01). 09/13/19   Isla Pence, MD  clopidogrel (PLAVIX) 75 MG tablet Take 1 tablet (75 mg total) by mouth daily. Patient not taking: Reported on 01/08/2021 06/09/20   Little Ishikawa, MD  cyclobenzaprine (FLEXERIL) 5 MG tablet Take 1 tablet (5 mg total) by  mouth 3 (three) times daily as needed for muscle spasms. 03/30/20   Arrien, Jimmy Picket, MD  dicyclomine (BENTYL) 20 MG tablet Take 20 mg by mouth daily. 07/02/20   [provider]  docusate sodium (COLACE) 100 MG capsule Take 100 mg by mouth daily as needed for mild constipation.    [provider]  docusate sodium (COLACE) 100 MG capsule Take 1 capsule (100 mg total) by mouth every 12 (twelve) hours. 04/04/21   [name], PA-C  glipiZIDE (GLUCOTROL) 10 MG tablet Take 10 mg by mouth daily before breakfast.    [provider]  hydrALAZINE (APRESOLINE) 50 MG tablet Take 50 mg by mouth 3 (three) times daily.    [provider]  Hyoscyamine Sulfate SL (LEVSIN/SL) 0.125 MG SUBL Place 1 each under the tongue 4 (four) times daily as needed for up to 5 days. 01/08/21 01/13/21  Fatima Blank, MD  insulin glargine-yfgn (SEMGLEE) 100 UNIT/ML Pen Inject 12 Units into the skin 2 (two) times daily. Patient not taking: Reported on 04/04/2021    [provider]  insulin lispro (HUMALOG) 100 UNIT/ML KwikPen Inject 0-30 Units into the skin as directed. INJECT SUBCUTANEOUSLY BASED ON BLOOD SUGAR RESULTS WITH MEALS THREE TIMES DAILY PRN FOR HIGH BLOOD SUGAR. MAX OF 30 UNITS IN 24 HOURS 01/27/21   Davonna Belling, MD  Insulin Pen Needle 29G X 12MM MISC Per instructions 04/08/19   [name], MD  JARDIANCE 25 MG TABS tablet  Take 25 mg by mouth every morning. 12/14/20   [provider]  lidocaine (XYLOCAINE) 2 % solution Use as directed 10 mLs in the mouth or throat every 6 (six) hours as needed for mouth pain. 01/08/21   Fatima Blank, MD  metoprolol succinate (TOPROL-XL) 50 MG 24 hr tablet Take 25 mg by mouth daily. Take with or immediately following a meal. Patient not taking: Reported on 04/04/2021    [provider]  mirtazapine (REMERON) 7.5 MG tablet Take 1 tablet by mouth at bedtime. 06/22/21   [provider]   OLANZapine (ZYPREXA) 5 MG tablet Take 1 tablet by mouth at bedtime. 11/04/21   [provider]  ondansetron (ZOFRAN ODT) 4 MG disintegrating tablet Take 1 tablet (4 mg total) by mouth every 8 (eight) hours as needed for nausea or vomiting. Patient taking differently: Take 8 mg by mouth every 8 (eight) hours as needed for nausea or vomiting. 06/09/20   Little Ishikawa, MD  pantoprazole (PROTONIX) 40 MG tablet Take 40 mg by mouth daily. 06/29/20   [provider]  polyethylene glycol (MIRALAX / GLYCOLAX) 17 g packet Take 17 g by mouth daily as needed for moderate constipation. 01/27/21   Davonna Belling, MD  polyethylene glycol powder (GLYCOLAX/MIRALAX) 17 GM/SCOOP powder Take 17 g by mouth 2 (two) times daily. Until daily soft stools  OTC 04/04/21   [name], PA-C  rosuvastatin (CRESTOR) 40 MG tablet Take 40 mg by mouth daily.    [provider]  Suvorexant (BELSOMRA) 10 MG TABS Take 10 mg by mouth at bedtime.    [provider]      Allergies    Lisinopril and Omeprazole    Review of Systems   Review of Systems  Physical Exam Updated Vital Signs BP (!) 146/75 (BP Location: Left Arm)   Pulse 96   Temp 98.7 F (37.1 C) (Oral)   Resp 16   Ht 5' 6"  (1.676 m)   Wt 55.8 kg   SpO2 99%   BMI 19.85 kg/m  Physical Exam Vitals and nursing note reviewed.  Constitutional:      General: He is not in acute distress.    Appearance: He is well-developed. He is not ill-appearing.  HENT:     Head: Normocephalic and atraumatic.     Nose: Nose normal.     Mouth/Throat:     Mouth: Mucous membranes are moist.  Eyes:     Extraocular Movements: Extraocular movements intact.     Conjunctiva/sclera: Conjunctivae normal.     Pupils: Pupils are equal, round, and reactive to light.  Cardiovascular:     Rate and Rhythm: Normal rate and regular rhythm.     Pulses: Normal pulses.     Heart sounds: Normal heart sounds. No murmur heard. Pulmonary:      Effort: Pulmonary effort is normal. No respiratory distress.     Breath sounds: Normal breath sounds.  Abdominal:     Palpations: Abdomen is soft.     Tenderness: There is no abdominal tenderness.  Musculoskeletal:        General: No swelling.     Cervical back: Normal range of motion and neck supple.  Skin:    General: Skin is warm and dry.     Capillary Refill: Capillary refill takes less than 2 seconds.  Neurological:     Mental Status: He is alert.  Psychiatric:        Mood and Affect: Mood normal.  Comments: Denies SI and HI     ED Results / Procedures / Treatments   Labs (all labs ordered are listed, but only abnormal results are displayed) Labs Reviewed  COMPREHENSIVE METABOLIC PANEL - Abnormal; Notable for the following components:      Result Value   Potassium 3.4 (*)    Chloride 97 (*)    Glucose, Bld 312 (*)    Calcium 8.3 (*)    All other components within normal limits  RAPID URINE DRUG SCREEN, HOSP PERFORMED - Abnormal; Notable for the following components:   Cocaine POSITIVE (*)    All other components within normal limits  CBC WITH DIFFERENTIAL/PLATELET - Abnormal; Notable for the following components:   RBC 3.34 (*)    Hemoglobin 9.7 (*)    HCT 29.9 (*)    All other components within normal limits  RESP PANEL BY RT-PCR (FLU A&B, COVID) [id number]  ETHANOL    EKG EKG Interpretation  Date/Time:  Wednesday November 10 2021 17:23:25 EDT Ventricular Rate:  93 PR Interval:  146 QRS Duration: 74 QT Interval:  378 QTC Calculation: 469 R Axis:   61 Text Interpretation: Normal sinus rhythm Right atrial enlargement Borderline ECG When compared with ECG of 12-Feb-2021 10:59, PREVIOUS ECG IS PRESENT Confirmed by Lennice Sites ([id number]) on 11/10/2021 5:31:18 PM  Radiology No results found.  Procedures Procedures    Medications Ordered in ED Medications - No data to display  ED Course/ Medical Decision Making/ A&P                           Medical  Decision Making Amount and/or Complexity of Data Reviewed Labs: ordered.   DJ SENTENO is here with IVC.  Per IVC patient with crack cocaine abuse.  He has been not taking care of himself, concern for mental health issue per family member per IVC.  I am unable to get on the phone with her brother for collateral information.  Patient appears well.  He denies any chest pain or shortness of breath.  He denies any suicidal homicidal ideation.  I do not see any obvious mental health disorder on his med list.  Does have a history of hypertension and diabetes.  Overall, suspect that this is polysubstance abuse related behavior and not necessarily schizophrenia or bipolar.  I cannot find collateral information to get more information about what is going on therefore we will have TTS performed and have social work and case management involvement to help maybe with some rehab resources.  Patient states he has been to rehab in the past.  We will get medical clearance labs a CBC, BMP, urine drug screen.  Per my review of lab work, there is no significant anemia or electrolyte abnormality.  Blood sugars 312 but patient not in DKA.  We will restart his home medications and start him on sliding scale insulin.  Drug screen positive for cocaine.  Per my review and interpretation of psychiatry note they are recommending inpatient psych stabilization.  He has a history of schizophrenia.  Patient to await psych placement.  Medically cleared.  This chart was dictated using voice recognition software.  Despite best efforts to proofread,  errors can occur which can change the documentation meaning.         Final Clinical Impression(s) / ED Diagnoses Final diagnoses:  Mental health problem  Polysubstance abuse ([place])    Rx / DC Orders ED Discharge Orders  None         Lennice Sites, DO 11/10/21 1923

## 2021-11-10 NOTE — ED Notes (Signed)
Refused [facility] 

## 2021-11-10 NOTE — ED Notes (Addendum)
Pt changed into burgundy scrubs. Pt personal belonging's placed in 2 bags, labeled. One bag contains his clothes and shoes. The other bag contains a jacket with his phone, his debit/ credit cards, and a pack of cigarettes in the pocket. Both bags placed in cabinets: [address], [address]. Pt has personal cane (labeled), placed under desk under cabinets [address]. Security wanded pt

## 2021-11-10 NOTE — ED Notes (Signed)
Refused all ordered meds including nicotine that was offered to him in place of a cigarette.
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
## 2021-11-10 NOTE — ED Notes (Signed)
Pt calm and cooperative. PD at bedside left, okayed by charge nurse.

## 2021-11-11 ENCOUNTER — Encounter (HOSPITAL_COMMUNITY): Payer: Self-pay | Admitting: Emergency Medicine

## 2021-11-11 DIAGNOSIS — F259 Schizoaffective disorder, unspecified: Secondary | ICD-10-CM

## 2021-11-11 DIAGNOSIS — F1420 Cocaine dependence, uncomplicated: Secondary | ICD-10-CM

## 2021-11-11 LAB — CBG MONITORING, ED
Glucose-Capillary: 376 mg/dL — ABNORMAL HIGH (ref 70–99)
Glucose-Capillary: 83 mg/dL (ref 70–99)
Glucose-Capillary: 253 mg/dL — ABNORMAL HIGH (ref 70–99)

## 2021-11-11 MED ORDER — INSULIN ASPART 100 UNIT/ML IJ SOLN
3.0000 [IU] | Freq: Three times a day (TID) | INTRAMUSCULAR | Status: DC
  Filled 2021-11-11: qty 0.03

## 2021-11-11 MED ORDER — INSULIN ASPART PROT & ASPART (70-30 MIX) 100 UNIT/ML ~~LOC~~ SUSP
3.0000 [IU] | Freq: Three times a day (TID) | SUBCUTANEOUS | Status: DC
  Filled 2021-11-11: qty 10

## 2021-11-11 MED ORDER — ONDANSETRON 4 MG PO TBDP
4.0000 mg | ORAL_TABLET | Freq: Three times a day (TID) | ORAL | Status: DC | PRN
  Administered 2021-11-11: 4 mg via ORAL
  Filled 2021-11-11: qty 1

## 2021-11-11 MED ORDER — GLIPIZIDE 10 MG PO TABS
10.0000 mg | ORAL_TABLET | Freq: Every day | ORAL | Status: DC
  Filled 2021-11-11: qty 1

## 2021-11-11 MED ORDER — INSULIN GLARGINE-YFGN 100 UNIT/ML ~~LOC~~ SOLN
8.0000 [IU] | Freq: Two times a day (BID) | SUBCUTANEOUS | Status: DC
  Filled 2021-11-11 (×2): qty 0.08

## 2021-11-11 MED ORDER — INSULIN ASPART PROT & ASPART (70-30 MIX) 100 UNIT/ML ~~LOC~~ SUSP: 4.0000 [IU] | Freq: Three times a day (TID) | SUBCUTANEOUS | Status: DC
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
## 2021-11-11 NOTE — Inpatient Diabetes Management (Signed)
Inpatient Diabetes Program Recommendations  AACE/ADA: New Consensus Statement on Inpatient Glycemic Control (2015)  Target Ranges:  Prepandial:   less than 140 mg/dL      Peak postprandial:   less than 180 mg/dL (1-2 hours)      Critically ill patients:  140 - 180 mg/dL   Lab Results  Component Value Date   GLUCAP 376 (H) 11/11/2021   HGBA1C 8.8 (H) 07/10/2020    Review of Glycemic Control  Latest Reference Range & Units 11/10/21 23:08 11/11/21 07:49  Glucose-Capillary 70 - 99 mg/dL 315 (H) 376 (H)  (H): Data is abnormally high Diabetes history: Type 2 Dm Outpatient Diabetes medications: Jardiance 10 mg QD (NT), Glipizide 10 mg QD (NT), Semglee 12 units BID, Novolog 6 units TID Current orders for Inpatient glycemic control: Jardiance 10 mg QD, Glipizide 10 mg QD, Novolog 0-15 units TID & HS  Inpatient Diabetes Program Recommendations:    Consider adding Semglee 8 units BID and Novolog 3 units TID (assuming patient is consuming >50% of meals).   Thanks, Bronson Curb, MSN, RNC-OB Diabetes Coordinator [phone] (8a-5p)

## 2021-11-11 NOTE — ED Provider Notes (Signed)
Emergency Medicine Observation Re-evaluation Note  William Acevedo is a 66 y.o. male, seen on rounds today.  Pt initially presented to the ED for complaints of IVC Currently, the patient is awake and alert.  He is showing no signs of etoh w/dr.  He is hungry and wants to take a shower.    Physical Exam  BP (!) 180/91 (BP Location: Left Arm)   Pulse (!) 104   Temp 98.9 F (37.2 C) (Oral)   Resp 16   Ht 5\' 6"  (1.676 m)   Wt 55.8 kg   SpO2 100%   BMI 19.85 kg/m  Physical Exam General: awake and alert Cardiac: rr Lungs: clear Psych: calm  ED Course / MDM  EKG:EKG Interpretation  Date/Time:  Wednesday November 10 2021 17:23:25 EDT Ventricular Rate:  93 PR Interval:  146 QRS Duration: 74 QT Interval:  378 QTC Calculation: 469 R Axis:   61 Text Interpretation: Normal sinus rhythm Right atrial enlargement Borderline ECG When compared with ECG of 12-Feb-2021 10:59, PREVIOUS ECG IS PRESENT Confirmed by Lennice Sites ([id number]) on 11/10/2021 5:31:18 PM  I have reviewed the labs performed to date as well as medications administered while in observation.  Recent changes in the last 24 hours include pt refused meds and bs checks last night.  He allowed it this am.  TTS did evaluate pt and recommended inpatient psych admission.  He is medically clear.  Plan  Current plan is for inpatient psych.  BS still elevated, so I will consult diabetic coordinator.    Isla Pence, MD 11/11/21 [id number]

## 2021-11-11 NOTE — ED Provider Notes (Signed)
Patient has been cleared by psychiatry for discharge.  He denies any suicidal thoughts or homicidal thoughts.  He complains of some nausea and abdominal pain in the setting of his feeding tube.  Abdomen is soft and nontender.  Patient is tolerating p.o.  Imaging of his abdomen is offered but he declines at this time and states he just wants to leave and states his abdomen is "healing" from his feeding tube site. Not appear to be erythematous or inflamed at all.  IVC has been rescinded by psychiatry.  He denies feeling suicidal homicidal.   Ezequiel Essex, MD 11/11/21 1622

## 2021-11-11 NOTE — ED Notes (Signed)
Pt reports some nausea, md notified, zofran given.

## 2021-11-11 NOTE — Discharge Instructions (Signed)
  Discharge recommendations:  Patient is to take medications as prescribed. Please see information for follow-up appointment with psychiatry and therapy. Please follow up with your primary care provider for all medical related needs.   Therapy: We recommend that patient participate in individual therapy to address mental health concerns.  Medications: The patient is to contact a medical professional and/or outpatient provider to address any new side effects that develop. Patient should update outpatient providers of any new medications and/or medication changes.   Atypical antipsychotics: If you are prescribed an atypical antipsychotic, it is recommended that your height, weight, BMI, blood pressure, fasting lipid panel, and fasting blood sugar be monitored by your outpatient providers.  Safety:  The patient should abstain from use of illicit substances/drugs and abuse of any medications. If symptoms worsen or do not continue to improve or if the patient becomes actively suicidal or homicidal then it is recommended that the patient return to the closest hospital emergency department, the [facility], or call 911 for further evaluation and treatment. National Suicide Prevention Lifeline 1-800-SUICIDE or 1-800-273-8255.  About 988 988 offers 24/7 access to trained crisis counselors who can help people experiencing mental health-related distress. People can call or text 988 or chat 988lifeline.org for themselves or if they are worried about a loved one who may need crisis support.  Crisis Mobile: [facility]:                     1-877-626-1772 (for crisis response 24 hours a day) [facility] Hotline:                                            1-800-256-2452  

## 2021-11-11 NOTE — ED Notes (Signed)
Belongings returned to pt, cane returned to pt, states that he is not si or hi at this time, states that he wants to go home, states that he doesn't want any medications at this time. Pt denies pain, pt states that he has all his belongings, pt called brother who states that he won't come get him, per pt request, pt to waiting room to charge phone to call friend to come get him

## 2021-11-11 NOTE — ED Notes (Signed)
Pt in bed, pt arouses easily to verbal stim, pt refused most of his medications, stressed the importance of medicines, pt continued to refuse.  MD notified

## 2022-01-23 ENCOUNTER — Emergency Department (HOSPITAL_COMMUNITY): Payer: 59

## 2022-01-23 ENCOUNTER — Other Ambulatory Visit: Payer: Self-pay

## 2022-01-23 ENCOUNTER — Encounter (HOSPITAL_COMMUNITY): Payer: Self-pay | Admitting: *Deleted

## 2022-01-23 ENCOUNTER — Emergency Department (HOSPITAL_COMMUNITY)
Admission: EM | Admit: 2022-01-23 | Discharge: 2022-01-24 | Disposition: A | Discharge status: Home / Self Care | Payer: 59 | Attending: Emergency Medicine | Admitting: Emergency Medicine

## 2022-01-23 DIAGNOSIS — R456 Violent behavior: Secondary | ICD-10-CM | POA: Diagnosis present

## 2022-01-23 DIAGNOSIS — R1084 Generalized abdominal pain: Secondary | ICD-10-CM | POA: Insufficient documentation

## 2022-01-23 DIAGNOSIS — Z794 Long term (current) use of insulin: Secondary | ICD-10-CM | POA: Insufficient documentation

## 2022-01-23 DIAGNOSIS — M7989 Other specified soft tissue disorders: Secondary | ICD-10-CM | POA: Insufficient documentation

## 2022-01-23 DIAGNOSIS — F1490 Cocaine use, unspecified, uncomplicated: Secondary | ICD-10-CM | POA: Diagnosis present

## 2022-01-23 DIAGNOSIS — I160 Hypertensive urgency: Secondary | ICD-10-CM | POA: Insufficient documentation

## 2022-01-23 DIAGNOSIS — R4689 Other symptoms and signs involving appearance and behavior: Secondary | ICD-10-CM

## 2022-01-23 DIAGNOSIS — F17210 Nicotine dependence, cigarettes, uncomplicated: Secondary | ICD-10-CM | POA: Diagnosis not present

## 2022-01-23 DIAGNOSIS — Z79899 Other long term (current) drug therapy: Secondary | ICD-10-CM | POA: Diagnosis not present

## 2022-01-23 DIAGNOSIS — R112 Nausea with vomiting, unspecified: Secondary | ICD-10-CM | POA: Insufficient documentation

## 2022-01-23 DIAGNOSIS — E119 Type 2 diabetes mellitus without complications: Secondary | ICD-10-CM | POA: Diagnosis not present

## 2022-01-23 LAB — CBC
HCT: 38.3 % — ABNORMAL LOW (ref 39.0–52.0)
Hemoglobin: 12.7 g/dL — ABNORMAL LOW (ref 13.0–17.0)
MCH: 28.9 pg (ref 26.0–34.0)
MCHC: 33.2 g/dL (ref 30.0–36.0)
MCV: 87 fL (ref 80.0–100.0)
Platelets: 253 10*3/uL (ref 150–400)
RBC: 4.4 MIL/uL (ref 4.22–5.81)
RDW: 13 % (ref 11.5–15.5)
WBC: 6.7 10*3/uL (ref 4.0–10.5)
nRBC: 0 % (ref 0.0–0.2)

## 2022-01-23 LAB — COMPREHENSIVE METABOLIC PANEL
ALT: 22 U/L (ref 0–44)
AST: 27 U/L (ref 15–41)
Albumin: 3.6 g/dL (ref 3.5–5.0)
Alkaline Phosphatase: 63 U/L (ref 38–126)
Anion gap: 13 (ref 5–15)
BUN: 29 mg/dL — ABNORMAL HIGH (ref 8–23)
CO2: 27 mmol/L (ref 22–32)
Calcium: 9.1 mg/dL (ref 8.9–10.3)
Chloride: 85 mmol/L — ABNORMAL LOW (ref 98–111)
Creatinine, Ser: 1.39 mg/dL — ABNORMAL HIGH (ref 0.61–1.24)
GFR, Estimated: 56 mL/min — ABNORMAL LOW (ref >60)
Glucose, Bld: 553 mg/dL (ref 70–99)
Potassium: 3.7 mmol/L (ref 3.5–5.1)
Sodium: 125 mmol/L — ABNORMAL LOW (ref 135–145)
Total Bilirubin: 0.5 mg/dL (ref 0.3–1.2)
Total Protein: 7.3 g/dL (ref 6.5–8.1)

## 2022-01-23 LAB — CBG MONITORING, ED
Glucose-Capillary: 409 mg/dL — ABNORMAL HIGH (ref 70–99)
Glucose-Capillary: 127 mg/dL — ABNORMAL HIGH (ref 70–99)

## 2022-01-23 LAB — RAPID URINE DRUG SCREEN, HOSP PERFORMED
Amphetamines: NOT DETECTED
Barbiturates: NOT DETECTED
Benzodiazepines: NOT DETECTED
Cocaine: POSITIVE — AB
Opiates: NOT DETECTED
Tetrahydrocannabinol: NOT DETECTED

## 2022-01-23 LAB — ETHANOL: Alcohol, Ethyl (B): <10 mg/dL (ref <10)

## 2022-01-23 MED ORDER — ROSUVASTATIN CALCIUM 20 MG PO TABS
40.0000 mg | ORAL_TABLET | Freq: Every day | ORAL | Status: DC
  Administered 2022-01-23 – 2022-01-24 (×2): 40 mg via ORAL
  Filled 2022-01-23 (×2): qty 2

## 2022-01-23 MED ORDER — IOHEXOL 350 MG/ML SOLN
75.0000 mL | Freq: Once | INTRAVENOUS | Status: AC | PRN
  Administered 2022-01-23: 75 mL via INTRAVENOUS

## 2022-01-23 MED ORDER — INSULIN GLARGINE-YFGN 100 UNIT/ML ~~LOC~~ SOLN
12.0000 [IU] | Freq: Two times a day (BID) | SUBCUTANEOUS | Status: DC
  Administered 2022-01-24: 12 [IU] via SUBCUTANEOUS
  Filled 2022-01-23 (×3): qty 0.12

## 2022-01-23 MED ORDER — DULOXETINE HCL 20 MG PO CPEP
20.0000 mg | ORAL_CAPSULE | Freq: Every day | ORAL | Status: DC
  Administered 2022-01-24: 20 mg via ORAL
  Filled 2022-01-23 (×2): qty 1

## 2022-01-23 MED ORDER — CLOPIDOGREL BISULFATE 75 MG PO TABS
75.0000 mg | ORAL_TABLET | Freq: Every day | ORAL | Status: DC
  Administered 2022-01-23: 75 mg via ORAL
  Filled 2022-01-23: qty 1

## 2022-01-23 MED ORDER — INSULIN ASPART 100 UNIT/ML IJ SOLN
0.0000 [IU] | Freq: Three times a day (TID) | INTRAMUSCULAR | Status: DC
  Administered 2022-01-24: 2 [IU] via SUBCUTANEOUS

## 2022-01-23 MED ORDER — OLANZAPINE 10 MG PO TABS
5.0000 mg | ORAL_TABLET | Freq: Every day | ORAL | Status: DC
  Administered 2022-01-23: 5 mg via ORAL
  Filled 2022-01-23: qty 1

## 2022-01-23 MED ORDER — DIPHENHYDRAMINE HCL 50 MG/ML IJ SOLN
12.5000 mg | Freq: Once | INTRAMUSCULAR | Status: AC
  Administered 2022-01-23: 12.5 mg via INTRAVENOUS
  Filled 2022-01-23: qty 1

## 2022-01-23 MED ORDER — INSULIN ASPART 100 UNIT/ML IJ SOLN: 6.0000 [IU] | INTRAMUSCULAR | Status: DC

## 2022-01-23 MED ORDER — SODIUM CHLORIDE 0.9 % IV BOLUS
1000.0000 mL | Freq: Once | INTRAVENOUS | Status: AC
  Administered 2022-01-23: 1000 mL via INTRAVENOUS

## 2022-01-23 MED ORDER — FAMOTIDINE 20 MG PO TABS
20.0000 mg | ORAL_TABLET | Freq: Every day | ORAL | Status: DC
  Administered 2022-01-24: 20 mg via ORAL
  Filled 2022-01-23: qty 1

## 2022-01-23 MED ORDER — FOLIC ACID 1 MG PO TABS
1.0000 mg | ORAL_TABLET | Freq: Every day | ORAL | Status: DC
  Administered 2022-01-23 – 2022-01-24 (×2): 1 mg via ORAL
  Filled 2022-01-23 (×2): qty 1

## 2022-01-23 MED ORDER — GABAPENTIN 100 MG PO CAPS
200.0000 mg | ORAL_CAPSULE | Freq: Every day | ORAL | Status: DC
  Administered 2022-01-23: 200 mg via ORAL
  Filled 2022-01-23: qty 2

## 2022-01-23 MED ORDER — TRAZODONE HCL 50 MG PO TABS
100.0000 mg | ORAL_TABLET | Freq: Every day | ORAL | Status: DC
  Administered 2022-01-23: 100 mg via ORAL
  Filled 2022-01-23: qty 2

## 2022-01-23 MED ORDER — GLIPIZIDE 10 MG PO TABS: 10.0000 mg | ORAL_TABLET | Freq: Every day | ORAL | Status: DC

## 2022-01-23 MED ORDER — SODIUM CHLORIDE 0.9 % IV BOLUS
2000.0000 mL | Freq: Once | INTRAVENOUS | Status: AC
  Administered 2022-01-23: 2000 mL via INTRAVENOUS

## 2022-01-23 MED ORDER — INSULIN LISPRO (1 UNIT DIAL) 100 UNIT/ML (KWIKPEN): 0.0000 [IU] | PEN_INJECTOR | SUBCUTANEOUS | Status: DC

## 2022-01-23 MED ORDER — METOCLOPRAMIDE HCL 5 MG/ML IJ SOLN
5.0000 mg | Freq: Once | INTRAMUSCULAR | Status: AC
  Administered 2022-01-23: 5 mg via INTRAVENOUS
  Filled 2022-01-23: qty 2

## 2022-01-23 MED ORDER — ASPIRIN 81 MG PO TBEC
81.0000 mg | DELAYED_RELEASE_TABLET | Freq: Every day | ORAL | Status: DC
  Administered 2022-01-23 – 2022-01-24 (×2): 81 mg via ORAL
  Filled 2022-01-23 (×2): qty 1

## 2022-01-23 MED ORDER — INSULIN GLARGINE-YFGN 100 UNIT/ML ~~LOC~~ SOPN
12.0000 [IU] | PEN_INJECTOR | Freq: Two times a day (BID) | SUBCUTANEOUS | Status: DC
  Filled 2022-01-23: qty 3

## 2022-01-23 MED ORDER — EMPAGLIFLOZIN 25 MG PO TABS: 25.0000 mg | ORAL_TABLET | Freq: Every morning | ORAL | Status: DC

## 2022-01-23 MED ORDER — AMLODIPINE BESYLATE 5 MG PO TABS
10.0000 mg | ORAL_TABLET | Freq: Every day | ORAL | Status: DC
  Administered 2022-01-23 – 2022-01-24 (×2): 10 mg via ORAL
  Filled 2022-01-23 (×2): qty 2

## 2022-01-23 MED ORDER — INSULIN ASPART 100 UNIT/ML IJ SOLN
15.0000 [IU] | Freq: Once | INTRAMUSCULAR | Status: AC
  Administered 2022-01-23: 15 [IU] via INTRAVENOUS

## 2022-01-23 NOTE — ED Notes (Signed)
Bladder scan was not done, pt has voided several times thus far without any issues. MD informed

## 2022-01-23 NOTE — ED Notes (Signed)
IVC paperwork placed in orange zone with secretary
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
## 2022-01-23 NOTE — ED Notes (Signed)
PA notified of glucose

## 2022-01-23 NOTE — ED Notes (Addendum)
Pt is very drowsy, it was reported that he done drugs while in the ER once he checked in. Pt has even urinated on self while sleeping. Pt didn't move his arm for IV stick. BGL is 127 after insulin. Will continue to monitor. IVC paper work was faxed and pt will have a consult once he can sit up and have a conversation

## 2022-01-24 DIAGNOSIS — F1490 Cocaine use, unspecified, uncomplicated: Secondary | ICD-10-CM | POA: Diagnosis not present

## 2022-01-24 LAB — CBG MONITORING, ED
Glucose-Capillary: 84 mg/dL (ref 70–99)
Glucose-Capillary: 191 mg/dL — ABNORMAL HIGH (ref 70–99)
Glucose-Capillary: 333 mg/dL — ABNORMAL HIGH (ref 70–99)

## 2022-01-24 LAB — HEMOGLOBIN A1C
Hgb A1c MFr Bld: 10.6 % — ABNORMAL HIGH (ref 4.8–5.6)
Mean Plasma Glucose: 258 mg/dL

## 2022-01-24 MED ORDER — GABAPENTIN 300 MG PO CAPS: 300.0000 mg | ORAL_CAPSULE | Freq: Every day | ORAL | Status: DC

## 2022-01-24 MED ORDER — GABAPENTIN 100 MG PO CAPS
100.0000 mg | ORAL_CAPSULE | Freq: Two times a day (BID) | ORAL | Status: DC
  Administered 2022-01-24: 100 mg via ORAL
  Filled 2022-01-24: qty 1

## 2022-01-24 NOTE — ED Notes (Signed)
Ivc paperwork is now in green zone

## 2022-01-24 NOTE — ED Notes (Addendum)
Pt is awake and ready to tele doc. Pt in the hallway and refused to go to a room to talk. "I am going to stay right here, I don't care." He will need to be assessed in person tomorrow.

## 2022-01-24 NOTE — Progress Notes (Signed)
Inpatient and outpatient substance abuse resources added to patients AVS. Address and phone number listed for the [facility] to seek additional substance abuse resources.

## 2022-01-24 NOTE — ED Notes (Signed)
Belonging returned to patients.

## 2022-01-24 NOTE — ED Notes (Signed)
The patient in no longer ivc  paperwork has been rescinded by mid level
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
## 2022-01-24 NOTE — ED Provider Notes (Addendum)
Emergency Medicine Observation Re-evaluation Note  William Acevedo is a 67 y.o. male, seen on rounds today.  Pt initially presented to the ED for complaints of Aggressive Behavior Currently, the patient is resting eating his breakfast.  Physical Exam  BP (!) 112/51 (BP Location: Left Arm)   Pulse 77   Temp 98.3 F (36.8 C)   Resp 16   Ht 5\' 6"  (1.676 m)   Wt 55.8 kg   SpO2 99%   BMI 19.86 kg/m  Physical Exam General: Resting without acute agitation Cardiac: No murmur on my exam Lungs: Clear bilaterally on my auscultation Psych: No agitation  ED Course / MDM  EKG:   I have reviewed the labs performed to date as well as medications administered while in observation.  Recent changes in the last 24 hours include none reported by nursing.  Plan  Current plan is for awaiting psychiatric recommendations.    William Acevedo, Gwenyth Allegra, MD 01/24/22 [phone]   12:25 PM Psychiatry rescinded his IVC and feel he is psychiatrically cleared for discharge home.  We will discharge him further recommendations.  They rescinded the IVC.   William Acevedo, Gwenyth Allegra, MD 01/24/22 1226

## 2022-01-24 NOTE — ED Notes (Signed)
Pt awakened for medication due at 0800.  Pt cooperative at this time. Sitter at bedside.

## 2022-01-24 NOTE — Discharge Instructions (Signed)
[place]  PLEASE FOLLOW-UP FOR ANY SUBSTANCE ABUSE RESOURCES  [place] [address]   [phone]

## 2022-01-24 NOTE — ED Notes (Signed)
Patient refused vitals.

## 2022-02-17 ENCOUNTER — Encounter (HOSPITAL_COMMUNITY): Payer: Self-pay

## 2022-02-17 ENCOUNTER — Emergency Department (HOSPITAL_COMMUNITY): Payer: 59

## 2022-02-17 ENCOUNTER — Inpatient Hospital Stay (HOSPITAL_COMMUNITY)
Admission: EM | Admit: 2022-02-17 | Discharge: 2022-02-24 | DRG: 065 | Disposition: A | Discharge status: Rehabilitation Facility | Payer: 59 | Attending: Internal Medicine | Admitting: Internal Medicine

## 2022-02-17 DIAGNOSIS — I10 Essential (primary) hypertension: Secondary | ICD-10-CM | POA: Diagnosis present

## 2022-02-17 DIAGNOSIS — E1140 Type 2 diabetes mellitus with diabetic neuropathy, unspecified: Secondary | ICD-10-CM | POA: Diagnosis present

## 2022-02-17 DIAGNOSIS — N179 Acute kidney failure, unspecified: Secondary | ICD-10-CM | POA: Diagnosis not present

## 2022-02-17 DIAGNOSIS — I69398 Other sequelae of cerebral infarction: Secondary | ICD-10-CM

## 2022-02-17 DIAGNOSIS — Z7982 Long term (current) use of aspirin: Secondary | ICD-10-CM

## 2022-02-17 DIAGNOSIS — Z888 Allergy status to other drugs, medicaments and biological substances status: Secondary | ICD-10-CM

## 2022-02-17 DIAGNOSIS — Z7902 Long term (current) use of antithrombotics/antiplatelets: Secondary | ICD-10-CM

## 2022-02-17 DIAGNOSIS — Z8611 Personal history of tuberculosis: Secondary | ICD-10-CM

## 2022-02-17 DIAGNOSIS — Z8679 Personal history of other diseases of the circulatory system: Secondary | ICD-10-CM

## 2022-02-17 DIAGNOSIS — I6300 Cerebral infarction due to thrombosis of unspecified precerebral artery: Secondary | ICD-10-CM | POA: Diagnosis not present

## 2022-02-17 DIAGNOSIS — I639 Cerebral infarction, unspecified: Secondary | ICD-10-CM

## 2022-02-17 DIAGNOSIS — E785 Hyperlipidemia, unspecified: Secondary | ICD-10-CM | POA: Diagnosis present

## 2022-02-17 DIAGNOSIS — Z681 Body mass index (BMI) 19 or less, adult: Secondary | ICD-10-CM | POA: Diagnosis not present

## 2022-02-17 DIAGNOSIS — R739 Hyperglycemia, unspecified: Secondary | ICD-10-CM

## 2022-02-17 DIAGNOSIS — R4182 Altered mental status, unspecified: Secondary | ICD-10-CM

## 2022-02-17 DIAGNOSIS — R29704 NIHSS score 4: Secondary | ICD-10-CM | POA: Diagnosis present

## 2022-02-17 DIAGNOSIS — G8929 Other chronic pain: Secondary | ICD-10-CM | POA: Diagnosis present

## 2022-02-17 DIAGNOSIS — I161 Hypertensive emergency: Secondary | ICD-10-CM | POA: Diagnosis present

## 2022-02-17 DIAGNOSIS — F250 Schizoaffective disorder, bipolar type: Secondary | ICD-10-CM | POA: Diagnosis not present

## 2022-02-17 DIAGNOSIS — E860 Dehydration: Principal | ICD-10-CM | POA: Diagnosis present

## 2022-02-17 DIAGNOSIS — I214 Non-ST elevation (NSTEMI) myocardial infarction: Secondary | ICD-10-CM | POA: Diagnosis not present

## 2022-02-17 DIAGNOSIS — Z7984 Long term (current) use of oral hypoglycemic drugs: Secondary | ICD-10-CM

## 2022-02-17 DIAGNOSIS — G9349 Other encephalopathy: Secondary | ICD-10-CM | POA: Diagnosis present

## 2022-02-17 DIAGNOSIS — I63132 Cerebral infarction due to embolism of left carotid artery: Secondary | ICD-10-CM | POA: Diagnosis present

## 2022-02-17 DIAGNOSIS — G9340 Encephalopathy, unspecified: Secondary | ICD-10-CM | POA: Diagnosis present

## 2022-02-17 DIAGNOSIS — F32A Depression, unspecified: Secondary | ICD-10-CM | POA: Diagnosis present

## 2022-02-17 DIAGNOSIS — F1090 Alcohol use, unspecified, uncomplicated: Secondary | ICD-10-CM | POA: Diagnosis present

## 2022-02-17 DIAGNOSIS — R4701 Aphasia: Secondary | ICD-10-CM | POA: Diagnosis present

## 2022-02-17 DIAGNOSIS — E876 Hypokalemia: Secondary | ICD-10-CM | POA: Diagnosis not present

## 2022-02-17 DIAGNOSIS — R200 Anesthesia of skin: Secondary | ICD-10-CM | POA: Diagnosis present

## 2022-02-17 DIAGNOSIS — G8191 Hemiplegia, unspecified affecting right dominant side: Secondary | ICD-10-CM | POA: Diagnosis present

## 2022-02-17 DIAGNOSIS — Z79899 Other long term (current) drug therapy: Secondary | ICD-10-CM

## 2022-02-17 DIAGNOSIS — Z794 Long term (current) use of insulin: Secondary | ICD-10-CM | POA: Diagnosis not present

## 2022-02-17 DIAGNOSIS — K219 Gastro-esophageal reflux disease without esophagitis: Secondary | ICD-10-CM | POA: Diagnosis present

## 2022-02-17 DIAGNOSIS — I252 Old myocardial infarction: Secondary | ICD-10-CM

## 2022-02-17 DIAGNOSIS — E1100 Type 2 diabetes mellitus with hyperosmolarity without nonketotic hyperglycemic-hyperosmolar coma (NKHHC): Secondary | ICD-10-CM

## 2022-02-17 DIAGNOSIS — G9389 Other specified disorders of brain: Secondary | ICD-10-CM | POA: Diagnosis present

## 2022-02-17 DIAGNOSIS — I6389 Other cerebral infarction: Secondary | ICD-10-CM | POA: Diagnosis not present

## 2022-02-17 DIAGNOSIS — F259 Schizoaffective disorder, unspecified: Secondary | ICD-10-CM | POA: Diagnosis present

## 2022-02-17 DIAGNOSIS — R29706 NIHSS score 6: Secondary | ICD-10-CM | POA: Diagnosis not present

## 2022-02-17 DIAGNOSIS — Z1152 Encounter for screening for COVID-19: Secondary | ICD-10-CM | POA: Diagnosis not present

## 2022-02-17 DIAGNOSIS — E1165 Type 2 diabetes mellitus with hyperglycemia: Secondary | ICD-10-CM | POA: Diagnosis present

## 2022-02-17 DIAGNOSIS — R636 Underweight: Secondary | ICD-10-CM | POA: Diagnosis present

## 2022-02-17 DIAGNOSIS — F1910 Other psychoactive substance abuse, uncomplicated: Secondary | ICD-10-CM | POA: Diagnosis present

## 2022-02-17 DIAGNOSIS — E11649 Type 2 diabetes mellitus with hypoglycemia without coma: Secondary | ICD-10-CM | POA: Diagnosis not present

## 2022-02-17 DIAGNOSIS — F17210 Nicotine dependence, cigarettes, uncomplicated: Secondary | ICD-10-CM | POA: Diagnosis present

## 2022-02-17 DIAGNOSIS — F1010 Alcohol abuse, uncomplicated: Secondary | ICD-10-CM | POA: Diagnosis present

## 2022-02-17 DIAGNOSIS — F4312 Post-traumatic stress disorder, chronic: Secondary | ICD-10-CM | POA: Diagnosis present

## 2022-02-17 DIAGNOSIS — R64 Cachexia: Secondary | ICD-10-CM | POA: Diagnosis present

## 2022-02-17 DIAGNOSIS — R770 Abnormality of albumin: Secondary | ICD-10-CM | POA: Diagnosis not present

## 2022-02-17 DIAGNOSIS — K5900 Constipation, unspecified: Secondary | ICD-10-CM | POA: Diagnosis not present

## 2022-02-17 DIAGNOSIS — I69351 Hemiplegia and hemiparesis following cerebral infarction affecting right dominant side: Secondary | ICD-10-CM | POA: Diagnosis not present

## 2022-02-17 DIAGNOSIS — F1410 Cocaine abuse, uncomplicated: Secondary | ICD-10-CM | POA: Diagnosis present

## 2022-02-17 DIAGNOSIS — I2510 Atherosclerotic heart disease of native coronary artery without angina pectoris: Secondary | ICD-10-CM | POA: Diagnosis present

## 2022-02-17 DIAGNOSIS — I63522 Cerebral infarction due to unspecified occlusion or stenosis of left anterior cerebral artery: Secondary | ICD-10-CM | POA: Diagnosis not present

## 2022-02-17 DIAGNOSIS — E119 Type 2 diabetes mellitus without complications: Secondary | ICD-10-CM

## 2022-02-17 HISTORY — DX: Alcohol abuse, uncomplicated: F10.10

## 2022-02-17 HISTORY — DX: Cerebral infarction, unspecified: I63.9

## 2022-02-17 HISTORY — DX: Zoster without complications: B02.9

## 2022-02-17 HISTORY — DX: Cocaine use, unspecified, uncomplicated: F14.90

## 2022-02-17 HISTORY — DX: Post-traumatic stress disorder, unspecified: F43.10

## 2022-02-17 HISTORY — DX: Endocarditis, valve unspecified: I38

## 2022-02-17 LAB — URINALYSIS, ROUTINE W REFLEX MICROSCOPIC
Bacteria, UA: NONE SEEN
Bilirubin Urine: NEGATIVE
Glucose, UA: >=500 mg/dL — AB
Hgb urine dipstick: NEGATIVE
Ketones, ur: 20 mg/dL — AB
Leukocytes,Ua: NEGATIVE
Nitrite: NEGATIVE
Protein, ur: 30 mg/dL — AB
Specific Gravity, Urine: 1.028 (ref 1.005–1.030)
pH: 5 (ref 5.0–8.0)

## 2022-02-17 LAB — COMPREHENSIVE METABOLIC PANEL
ALT: 23 U/L (ref 0–44)
AST: 30 U/L (ref 15–41)
Albumin: 3.6 g/dL (ref 3.5–5.0)
Alkaline Phosphatase: 58 U/L (ref 38–126)
Anion gap: 13 (ref 5–15)
BUN: 29 mg/dL — ABNORMAL HIGH (ref 8–23)
CO2: 23 mmol/L (ref 22–32)
Calcium: 8.7 mg/dL — ABNORMAL LOW (ref 8.9–10.3)
Chloride: 105 mmol/L (ref 98–111)
Creatinine, Ser: 1.07 mg/dL (ref 0.61–1.24)
GFR, Estimated: >60 mL/min (ref >60)
Glucose, Bld: 343 mg/dL — ABNORMAL HIGH (ref 70–99)
Potassium: 4.1 mmol/L (ref 3.5–5.1)
Sodium: 141 mmol/L (ref 135–145)
Total Bilirubin: 0.8 mg/dL (ref 0.3–1.2)
Total Protein: 7.4 g/dL (ref 6.5–8.1)

## 2022-02-17 LAB — LACTIC ACID, PLASMA
Lactic Acid, Venous: 2 mmol/L (ref 0.5–1.9)
Lactic Acid, Venous: 1.3 mmol/L (ref 0.5–1.9)

## 2022-02-17 LAB — RESP PANEL BY RT-PCR (RSV, FLU A&B, COVID)  RVPGX2
Influenza A by PCR: NEGATIVE
Influenza B by PCR: NEGATIVE
Resp Syncytial Virus by PCR: NEGATIVE
SARS Coronavirus 2 by RT PCR: NEGATIVE

## 2022-02-17 LAB — CBC WITH DIFFERENTIAL/PLATELET
Abs Immature Granulocytes: 0.02 10*3/uL (ref 0.00–0.07)
Basophils Absolute: 0 10*3/uL (ref 0.0–0.1)
Basophils Relative: 0 %
Eosinophils Absolute: 0 10*3/uL (ref 0.0–0.5)
Eosinophils Relative: 0 %
HCT: 47.1 % (ref 39.0–52.0)
Hemoglobin: 14.6 g/dL (ref 13.0–17.0)
Immature Granulocytes: 0 %
Lymphocytes Relative: 18 %
Lymphs Abs: 1.2 10*3/uL (ref 0.7–4.0)
MCH: 27.7 pg (ref 26.0–34.0)
MCHC: 31 g/dL (ref 30.0–36.0)
MCV: 89.4 fL (ref 80.0–100.0)
Monocytes Absolute: 0.3 10*3/uL (ref 0.1–1.0)
Monocytes Relative: 4 %
Neutro Abs: 5 10*3/uL (ref 1.7–7.7)
Neutrophils Relative %: 78 %
Platelets: 235 10*3/uL (ref 150–400)
RBC: 5.27 MIL/uL (ref 4.22–5.81)
RDW: 13 % (ref 11.5–15.5)
WBC: 6.5 10*3/uL (ref 4.0–10.5)
nRBC: 0 % (ref 0.0–0.2)

## 2022-02-17 LAB — RAPID URINE DRUG SCREEN, HOSP PERFORMED
Amphetamines: NOT DETECTED
Barbiturates: NOT DETECTED
Benzodiazepines: NOT DETECTED
Cocaine: POSITIVE — AB
Opiates: NOT DETECTED
Tetrahydrocannabinol: NOT DETECTED

## 2022-02-17 LAB — CBG MONITORING, ED: Glucose-Capillary: 365 mg/dL — ABNORMAL HIGH (ref 70–99)

## 2022-02-17 LAB — AMMONIA: Ammonia: <10 umol/L (ref 9–35)

## 2022-02-17 LAB — LIPASE, BLOOD: Lipase: 31 U/L (ref 11–51)

## 2022-02-17 MED ORDER — SODIUM CHLORIDE 0.9 % IV SOLN: INTRAVENOUS | Status: AC

## 2022-02-17 MED ORDER — ACETAMINOPHEN 160 MG/5ML PO SOLN: 650.0000 mg | ORAL | Status: DC | PRN

## 2022-02-17 MED ORDER — IOHEXOL 300 MG/ML  SOLN
100.0000 mL | Freq: Once | INTRAMUSCULAR | Status: AC | PRN
  Administered 2022-02-17: 100 mL via INTRAVENOUS

## 2022-02-17 MED ORDER — HEPARIN SODIUM (PORCINE) 5000 UNIT/ML IJ SOLN
5000.0000 [IU] | Freq: Three times a day (TID) | INTRAMUSCULAR | Status: DC
  Administered 2022-02-18 – 2022-02-24 (×17): 5000 [IU] via SUBCUTANEOUS
  Filled 2022-02-17 (×19): qty 1

## 2022-02-17 MED ORDER — SENNOSIDES-DOCUSATE SODIUM 8.6-50 MG PO TABS
1.0000 | ORAL_TABLET | Freq: Every evening | ORAL | Status: DC | PRN
  Administered 2022-02-22: 1 via ORAL
  Filled 2022-02-17: qty 1

## 2022-02-17 MED ORDER — ONDANSETRON HCL 4 MG/2ML IJ SOLN
4.0000 mg | Freq: Once | INTRAMUSCULAR | Status: AC
  Administered 2022-02-17: 4 mg via INTRAVENOUS
  Filled 2022-02-17: qty 2

## 2022-02-17 MED ORDER — CLOPIDOGREL BISULFATE 75 MG PO TABS
75.0000 mg | ORAL_TABLET | Freq: Every day | ORAL | Status: DC
  Administered 2022-02-19 – 2022-02-24 (×6): 75 mg via ORAL
  Filled 2022-02-17 (×7): qty 1

## 2022-02-17 MED ORDER — ACETAMINOPHEN 650 MG RE SUPP: 650.0000 mg | RECTAL | Status: DC | PRN

## 2022-02-17 MED ORDER — STROKE: EARLY STAGES OF RECOVERY BOOK
Freq: Once | Status: AC
  Filled 2022-02-17 (×2): qty 1

## 2022-02-17 MED ORDER — SODIUM CHLORIDE 0.9 % IV BOLUS
1000.0000 mL | Freq: Once | INTRAVENOUS | Status: AC
  Administered 2022-02-17: 1000 mL via INTRAVENOUS

## 2022-02-17 MED ORDER — ASPIRIN 81 MG PO TBEC
81.0000 mg | DELAYED_RELEASE_TABLET | Freq: Every day | ORAL | Status: DC
  Administered 2022-02-19 – 2022-02-24 (×6): 81 mg via ORAL
  Filled 2022-02-17 (×7): qty 1

## 2022-02-17 MED ORDER — IOHEXOL 350 MG/ML SOLN
75.0000 mL | Freq: Once | INTRAVENOUS | Status: AC | PRN
  Administered 2022-02-17: 75 mL via INTRAVENOUS

## 2022-02-17 MED ORDER — CLOPIDOGREL BISULFATE 300 MG PO TABS
300.0000 mg | ORAL_TABLET | Freq: Once | ORAL | Status: AC
  Administered 2022-02-17: 300 mg via ORAL
  Filled 2022-02-17: qty 1

## 2022-02-17 MED ORDER — ACETAMINOPHEN 325 MG PO TABS: 650.0000 mg | ORAL_TABLET | ORAL | Status: DC | PRN

## 2022-02-17 NOTE — ED Provider Notes (Signed)
MRI shows acute to early subacute infarct in the left frontal lobe.  I spoke with neurology and they will consult on the patient.  They would like the patient to be admitted to [facility] underneath the hospitalist service.  He will be given some Plavix and will get a CT angio of the head   Milton Ferguson, MD 02/17/22 2046

## 2022-02-17 NOTE — ED Notes (Signed)
Patient transported to CT 

## 2022-02-17 NOTE — ED Triage Notes (Signed)
EMS from home - pt brother reports pt being lethargic, vomiting, having decreased intake for 3 days, non verbal at this time, which is not normal for the pt.  198/108 BP 104 HR 99% ra 451 cbg

## 2022-02-17 NOTE — H&P (Signed)
History and Physical    William Acevedo NFA:[id number] DOB: 1955/08/12 DOA: 02/17/2022  PCP: [facility]  Patient coming from: home  I have personally briefly reviewed patient's old medical records in [facility]  Chief Complaint:   HPI: William Acevedo is a 67 y.o. male with medical history significant of  CAD s/p NSTEMI, hyperlipedemia, DMII insulin dependent, cerebral thrombosis with CVA, Essential hypertension, hx of polysubstance abuse , hx of endocarditis, hx of pulmonary cryptococosis, GERD, etoh abuse, Schizoaffectic disorder ,PTSD Who presents to ED with increase lethargy , vomiting decreased intake x 3 days  as well as being nonverbal which is not patient baseline.  Patient currently unable to give history , but is able to follow commands he answers yes to all questions.  ED Course:  Afeb, BP 188/89, HR 103, rr 20  Sat 99% on ra  Na 141, K 4.1, glu 343, cr 1.07  Wbc 6.5, hgb 14.6, plt 235 Lactic acid 2, repeat 1.3 ION:GEXBM tachycardia , RAE Ua:neg UDS, + cocaine  Cxr nad MRI 1. Acute to early subacute infarct in the medial left frontal lobe, correlating with the left ACA territory and ACA-PCA watershed. No evidence of hemorrhagic transformation or significant mass effect. 2. Remote infarcts involving the bilateral occipital cortex, right parietal cortex, and posterior left frontal lobe white matter.  CTH IMPRESSION: Extensive, multifocal bilateral encephalomalacia, some of which appears to be new or at least increased compared to prior examination dated 07/09/2020 although is generally age indeterminate. Recommend MR to further evaluate if there is clinical suspicion for acute diffusion restricting infarction.  CTAB IMPRESSION: 1. No acute findings in the abdomen pelvis. 2. Bilateral nonobstructing renal calculi. 3. Very little intra-abdominal fat. 4.  Aortic Atherosclerosis (ICD10-I70.0).  CTA 1. Left A2 segment occlusion,  corresponding to the infarct seen on concomitant brain MRI. 2. Severe stenosis of the proximal cavernous segment of the left ICA.  Review of Systems: As per HPI otherwise 10 point review of systems negative.   Past Medical History:  Diagnosis Date   Carpal tunnel syndrome    Depression    Diabetes mellitus without complication (HCC)    Hypertensive urgency 04/14/2020   Neuropathy     Past Surgical History:  Procedure Laterality Date   BUBBLE STUDY  03/30/2020   Procedure: BUBBLE STUDY;  Surgeon: Jake Bathe, MD;  Location: [facility] ENDOSCOPY;  Service: Cardiovascular;;   TEE WITHOUT CARDIOVERSION N/A 03/30/2020   Procedure: TRANSESOPHAGEAL ECHOCARDIOGRAM (TEE);  Surgeon: Jake Bathe, MD;  Location: [facility] ENDOSCOPY;  Service: Cardiovascular;  Laterality: N/A;   TESTICLE TORSION REDUCTION       reports that he has been smoking cigarettes. He has been smoking an average of .5 packs per day. He has never used smokeless tobacco. He reports current alcohol use. He reports current drug use. Drug: Cocaine.  Allergies  Allergen Reactions   Lisinopril Swelling    Angioedema Pt reports not allergic 06/07/20   Omeprazole     Other reaction(s): ANGIOEDEMA OF LIPS, Angioedema of tongue. Pt reports not allergic 06/07/20    History reviewed. No pertinent family history.  Prior to Admission medications   Medication Sig Start Date End Date Taking? Authorizing Provider  ACCU-CHEK GUIDE test strip  10/29/21   [provider]  albuterol (PROVENTIL) (2.5 MG/3ML) 0.083% nebulizer solution Take 3 mLs by nebulization every 6 (six) hours as needed. Patient not taking: Reported on 11/10/2021 12/02/20   [provider]  amLODipine (NORVASC) 10 MG  tablet Take 10 mg by mouth daily. 06/26/20   [provider]  aspirin EC 81 MG EC tablet Take 1 tablet (81 mg total) by mouth daily. Swallow whole. 06/09/20   Little Ishikawa, MD  blood glucose meter kit and supplies KIT Dispense  based on patient and insurance preference. Use up to four times daily as directed. (FOR ICD-9 250.00, 250.01). 09/13/19   Isla Pence, MD  clopidogrel (PLAVIX) 75 MG tablet Take 1 tablet (75 mg total) by mouth daily. Patient not taking: Reported on 01/08/2021 06/09/20   Little Ishikawa, MD  DULoxetine (CYMBALTA) 60 MG capsule Take 60 mg by mouth daily. For chronic pain and mood    [provider]  famotidine (PEPCID) 20 MG tablet Take 20 mg by mouth daily before breakfast.    [provider]  fluconazole (DIFLUCAN) 200 MG tablet Take 200 mg by mouth daily.    [provider]  folic acid (FOLVITE) 1 MG tablet Take 1 mg by mouth daily.    [provider]  gabapentin (NEURONTIN) 100 MG capsule Take 100-300 mg by mouth See admin instructions. Take 1 capsule by mouth twice a day, then take 3 capsules at bedtime    [provider]  glipiZIDE (GLUCOTROL) 10 MG tablet Take 10 mg by mouth daily before breakfast. Patient not taking: Reported on 11/10/2021    [provider]  hydrALAZINE (APRESOLINE) 10 MG tablet Take 10 mg by mouth in the morning and at bedtime.    [provider]  insulin aspart (NOVOLOG) 100 UNIT/ML injection Inject 6 Units into the skin See admin instructions. INJECT 6 UNITS UNDER THE SKIN THREE TIMES A DAY WITH MEALS FOR DIABETES  "DISCARD AND OPEN A NEW PEN EVERY 28 DAYS" Patient not taking: Reported on 01/24/2022 07/16/21   [provider]  insulin glargine-yfgn (SEMGLEE) 100 UNIT/ML Pen Inject 15 Units into the skin 2 (two) times daily.    [provider]  insulin lispro (HUMALOG) 100 UNIT/ML KwikPen Inject 0-30 Units into the skin as directed. INJECT SUBCUTANEOUSLY BASED ON BLOOD SUGAR RESULTS WITH MEALS THREE TIMES DAILY PRN FOR HIGH BLOOD SUGAR. MAX OF 30 UNITS IN 24 HOURS 01/27/21   Davonna Belling, MD  Insulin Pen Needle 29G X 12MM MISC Per instructions 04/08/19   [name], MD  JARDIANCE 25  MG TABS tablet Take 25 mg by mouth every morning. Patient not taking: Reported on 11/10/2021 12/14/20   [provider]  melatonin 3 MG TABS tablet Take 6 mg by mouth at bedtime as needed (for sleep).    [provider]  metoprolol succinate (TOPROL-XL) 50 MG 24 hr tablet Take 25 mg by mouth daily. Take with or immediately following a meal. Patient not taking: Reported on 01/24/2022    [provider]  nicotine polacrilex (COMMIT) 2 MG lozenge Take 2 mg by mouth as needed for smoking cessation. Patient not taking: Reported on 11/11/2021    [provider]  OLANZapine (ZYPREXA) 5 MG tablet Take 5 mg by mouth at bedtime. 11/04/21   [provider]  ondansetron (ZOFRAN) 4 MG tablet Take 4 mg by mouth every 8 (eight) hours as needed for nausea or vomiting.    [provider]  pantoprazole (PROTONIX) 40 MG tablet Take 40 mg by mouth daily. Patient not taking: Reported on 11/10/2021 06/29/20   [provider]  polyethylene glycol powder (GLYCOLAX/MIRALAX) 17 GM/SCOOP powder Take 17 g by mouth 2 (two) times daily. Until  daily soft stools  OTC Patient not taking: Reported on 11/10/2021 04/04/21   [name], PA-C  rivastigmine (EXELON) 1.5 MG capsule Take 1.5 mg by mouth 2 (two) times daily. Patient not taking: Reported on 11/11/2021    [provider]  rosuvastatin (CRESTOR) 40 MG tablet Take 40 mg by mouth daily.    [provider]  Suvorexant (BELSOMRA) 10 MG TABS Take 10 mg by mouth at bedtime.    [provider]  traMADol (ULTRAM) 50 MG tablet Take 50 mg by mouth every 6 (six) hours as needed. 08/27/21   [provider]  traZODone (DESYREL) 100 MG tablet Take 100 mg by mouth at bedtime. 10/01/21   [provider]    Physical Exam: Vitals:   02/17/22 1930 02/17/22 2030 02/17/22 2047 02/17/22 2130  BP: (!) 178/87  (!) 149/94 (!) 199/102  Pulse: 100 89  98  Resp: 15 18  17   Temp:       TempSrc:      SpO2: 99% 100%  98%    Constitutional: NAD, calm, comfortable Vitals:   02/17/22 1930 02/17/22 2030 02/17/22 2047 02/17/22 2130  BP: (!) 178/87  (!) 149/94 (!) 199/102  Pulse: 100 89  98  Resp: 15 18  17   Temp:      TempSrc:      SpO2: 99% 100%  98%   Eyes: PERRL, lids and conjunctivae normal ENMT: Mucous membranes are moist. Posterior pharynx clear of any exudate or lesions.Normal dentition.  Neck: normal, supple, no masses, no thyromegaly Respiratory: clear to auscultation bilaterally, no wheezing, no crackles. Normal respiratory effort. No accessory muscle use.  Cardiovascular: Regular rate and rhythm, no murmurs / rubs / gallops. No extremity edema. 2+ pedal pulses.  Abdomen: no tenderness, no masses palpated. No hepatosplenomegaly. Bowel sounds positive.  Musculoskeletal: no clubbing / cyanosis. No joint deformity upper and lower extremities. Good ROM, no contractures. Normal muscle tone.  Decrease grip on right Skin: no rashes, lesions, ulcers. No induration Neurologic: CN 2-12 grossly intact. Sensation intact, Strength 5/5 in all 4.  Psychiatricunable to assess  , no current agitation . Normal mood.    Labs on Admission: I have personally reviewed following labs and imaging studies  CBC: Recent Labs  Lab 02/17/22 1513  WBC 6.5  NEUTROABS 5.0  HGB 14.6  HCT 47.1  MCV 89.4  PLT [id number]   Basic Metabolic Panel: Recent Labs  Lab 02/17/22 1513  NA 141  K 4.1  CL 105  CO2 23  GLUCOSE 343*  BUN 29*  CREATININE 1.07  CALCIUM 8.7*   GFR: CrCl cannot be calculated (Unknown ideal weight.). Liver Function Tests: Recent Labs  Lab 02/17/22 1513  AST 30  ALT 23  ALKPHOS 58  BILITOT 0.8  PROT 7.4  ALBUMIN 3.6   Recent Labs  Lab 02/17/22 2119  LIPASE 31   Recent Labs  Lab 02/17/22 1514  AMMONIA <10   Coagulation Profile: No results for input(s): "INR", "PROTIME" in the last 168 hours. Cardiac Enzymes: No results for input(s): "CKTOTAL",  "CKMB", "CKMBINDEX", "TROPONINI" in the last 168 hours. BNP (last 3 results) No results for input(s): "PROBNP" in the last 8760 hours. HbA1C: No results for input(s): "HGBA1C" in the last 72 hours. CBG: Recent Labs  Lab 02/17/22 1517  GLUCAP 365*   Lipid Profile: No results for input(s): "CHOL", "HDL", "LDLCALC", "TRIG", "CHOLHDL", "LDLDIRECT" in the last 72 hours. Thyroid Function Tests: No results for input(s): "TSH", "T4TOTAL", "FREET4", "T3FREE", "THYROIDAB"  in the last 72 hours. Anemia Panel: No results for input(s): "VITAMINB12", "FOLATE", "FERRITIN", "TIBC", "IRON", "RETICCTPCT" in the last 72 hours. Urine analysis:    Component Value Date/Time   COLORURINE STRAW (A) 02/17/2022 1443   APPEARANCEUR CLEAR 02/17/2022 1443   LABSPEC 1.028 02/17/2022 1443   PHURINE 5.0 02/17/2022 1443   GLUCOSEU >=500 (A) 02/17/2022 1443   HGBUR NEGATIVE 02/17/2022 1443   BILIRUBINUR NEGATIVE 02/17/2022 1443   KETONESUR 20 (A) 02/17/2022 1443   PROTEINUR 30 (A) 02/17/2022 1443   NITRITE NEGATIVE 02/17/2022 1443   LEUKOCYTESUR NEGATIVE 02/17/2022 1443    Radiological Exams on Admission: CT Angio Head W or Wo Contrast  Result Date: 02/17/2022 CLINICAL DATA:  Stroke follow-up EXAM: CT ANGIOGRAPHY HEAD TECHNIQUE: Multidetector CT imaging of the head was performed using the standard protocol during bolus administration of intravenous contrast. Multiplanar CT image reconstructions and MIPs were obtained to evaluate the vascular anatomy. RADIATION DOSE REDUCTION: This exam was performed according to the departmental dose-optimization program which includes automated exposure control, adjustment of the mA and/or kV according to patient size and/or use of iterative reconstruction technique. CONTRAST:  19mL OMNIPAQUE IOHEXOL 350 MG/ML SOLN COMPARISON:  Brain MRI 02/17/2022 FINDINGS: POSTERIOR CIRCULATION: --Vertebral arteries: Normal --Inferior cerebellar arteries: Normal. --Basilar artery: Normal.  --Superior cerebellar arteries: Normal. --Posterior cerebral arteries: Normal. ANTERIOR CIRCULATION: --Intracranial internal carotid arteries: Severe stenosis of the proximal cavernous segment of the left ICA. Bilateral atherosclerotic calcification. --Anterior cerebral arteries (ACA): Left A2 segment occlusion, corresponding to the infarct seen on concomitant brain MRI. --Middle cerebral arteries (MCA): Normal. Venous sinuses: As permitted by contrast timing, patent. Anatomic variants: None Review of the MIP images confirms the above findings. IMPRESSION: 1. Left A2 segment occlusion, corresponding to the infarct seen on concomitant brain MRI. 2. Severe stenosis of the proximal cavernous segment of the left ICA. Electronically Signed   By: Ulyses Jarred M.D.   On: 02/17/2022 20:46   MR BRAIN WO CONTRAST  Result Date: 02/17/2022 CLINICAL DATA:  Stroke suspected EXAM: MRI HEAD WITHOUT CONTRAST TECHNIQUE: Multiplanar, multiecho pulse sequences of the brain and surrounding structures were obtained without intravenous contrast. COMPARISON:  07/10/2020 MRI head, correlation is made with CT head 02/17/2022 FINDINGS: Evaluation is somewhat limited by motion artifact. Brain: Restricted diffusion with ADC correlate in the medial left frontal lobe, primarily cortical and subcortical, extending from the anterior inferior left frontal lobe to the more superior and posterior left frontal lobe (series 5, images 15-27, 29, and 31), correlating with the left ACA territory and ACA-PCA watershed. These areas are associated with T2 hyperintense signal, likely cytotoxic edema. No acute hemorrhage, mass, mass effect, or midline shift. No hydrocephalus or extra-axial collection. Remote infarcts involving the bilateral occipital cortex, right parietal cortex, and posterior left frontal lobe white matter. Confluent T2 hyperintense signal in the periventricular white matter, likely the sequela of moderate to severe chronic small vessel  ischemic disease. Vascular: Normal proximal arterial flow voids. Skull and upper cervical spine: Normal marrow signal. Sinuses/Orbits: Mucosal thickening in the posterior left ethmoid air cells. No acute finding in the orbits. Other: The mastoids are well aerated. IMPRESSION: 1. Acute to early subacute infarct in the medial left frontal lobe, correlating with the left ACA territory and ACA-PCA watershed. No evidence of hemorrhagic transformation or significant mass effect. 2. Remote infarcts involving the bilateral occipital cortex, right parietal cortex, and posterior left frontal lobe white matter. These results were called by telephone at the time of interpretation on 02/17/2022 at 7:02  pm to provider ZAMMIT , who verbally acknowledged these results. Electronically Signed   By: Merilyn Baba M.D.   On: 02/17/2022 19:03   CT Head Wo Contrast  Result Date: 02/17/2022 CLINICAL DATA:  Altered mental status, lethargy EXAM: CT HEAD WITHOUT CONTRAST TECHNIQUE: Contiguous axial images were obtained from the base of the skull through the vertex without intravenous contrast. RADIATION DOSE REDUCTION: This exam was performed according to the departmental dose-optimization program which includes automated exposure control, adjustment of the mA and/or kV according to patient size and/or use of iterative reconstruction technique. COMPARISON:  CT brain, 07/09/2020 FINDINGS: Brain: Extensive, multifocal bilateral encephalomalacia, some of which appears to be new or at least increased compared to prior examination, for example in the medial right frontal lobe (series 2, image 18) and in the right parietal vertex (series 2, image 18). No evidence of hemorrhage, hydrocephalus, extra-axial collection or mass lesion/mass effect. Vascular: No hyperdense vessel or unexpected calcification. Skull: Normal. Negative for fracture or focal lesion. Sinuses/Orbits: No acute finding. Other: None. IMPRESSION: Extensive, multifocal bilateral  encephalomalacia, some of which appears to be new or at least increased compared to prior examination dated 07/09/2020 although is generally age indeterminate. Recommend MR to further evaluate if there is clinical suspicion for acute diffusion restricting infarction. Electronically Signed   By: Delanna Ahmadi M.D.   On: 02/17/2022 16:21   CT ABDOMEN PELVIS W CONTRAST  Result Date: 02/17/2022 CLINICAL DATA:  Abdominal pain.  Acute, nonlocalized. EXAM: CT ABDOMEN AND PELVIS WITH CONTRAST TECHNIQUE: Multidetector CT imaging of the abdomen and pelvis was performed using the standard protocol following bolus administration of intravenous contrast. RADIATION DOSE REDUCTION: This exam was performed according to the departmental dose-optimization program which includes automated exposure control, adjustment of the mA and/or kV according to patient size and/or use of iterative reconstruction technique. CONTRAST:  170mL OMNIPAQUE IOHEXOL 300 MG/ML  SOLN COMPARISON:  None Available. FINDINGS: Lower chest: Mild atelectasis in the RIGHT middle lobe. Otherwise lung bases clear. Hepatobiliary: No focal hepatic lesion. Normal gallbladder. No biliary duct dilatation. Common bile duct is normal. Pancreas: Pancreas normal Spleen: Normal spleen Adrenals/urinary tract: Adrenal glands normal. Bilateral nonobstructing renal calculi. No enhancing renal cortical lesion. Cortical scarring in the RIGHT kidney. Bladder unremarkable Stomach/Bowel: Stomach, small-bowel and cecum are normal. The appendix is not identified but there is no pericecal inflammation to suggest appendicitis. The colon and rectosigmoid colon are normal. Vascular/Lymphatic: Abdominal aorta is normal caliber with atherosclerotic calcification. There is no retroperitoneal or periportal lymphadenopathy. No pelvic lymphadenopathy. Reproductive: Prostate unremarkable Other: Very little intra-abdominal fat.  No free fluid or free air. Musculoskeletal: No aggressive osseous  lesion. IMPRESSION: 1. No acute findings in the abdomen pelvis. 2. Bilateral nonobstructing renal calculi. 3. Very little intra-abdominal fat. 4.  Aortic Atherosclerosis (ICD10-I70.0). Electronically Signed   By: Suzy Bouchard M.D.   On: 02/17/2022 16:18   DG Chest Portable 1 View  Result Date: 02/17/2022 CLINICAL DATA:  Altered mental status EXAM: PORTABLE CHEST 1 VIEW COMPARISON:  CXR 01/23/22 FINDINGS: No pleural effusion. No pneumothorax. No focal airspace opacity. Normal cardiac and mediastinal contours. No radiographically apparent displaced rib fractures. Visualized upper abdomen is unremarkable. IMPRESSION: No active disease. Electronically Signed   By: Marin Roberts M.D.   On: 02/17/2022 15:17    EKG: Independently reviewed.see above   Assessment/Plan  Acute to early subacute infarct in the medial left frontal lobe correlating with the left ACA territory and ACA-PCA watershed. Left A2 segment occlusion, corresponding to  the infarct seen  Severe stenosis of the proximal cavernous segment of the left ICA -hx fo prior CVA -patient with expressive aphasia , decrease grip on right otherwise neuro grossly intact  - admit to [facility]  -A1c/HLD pending -high intensity statin  -Permissive HTN x48 hours  -goal BP < 220/110, PRN above these parameters -s/p load with plavix - ASA 81mg  daily + plavix 75mg  daily  -neuro checks , SLP, PT/OT ,echo -await final neuro recs    DMII -uncontrolled  -iss/fs  -resume home regimen once med rec completed  -check A1c  -last A1c  10.6 ( 01/23/22)  Uncontrolled Hypertension  - goal BP < 220/110, PRN above these parameters -permissive HTN  -f/u with resuming oral bp medications, in am     CAD s/p NSTEMI -resume cardiac medications as able once med completed    Hyperlipidemia -statin   Hx of polysubstance abuse  -cocaine , etoh  -UDS + cocaine on admisison  -place on ciwa     GERD -ppi   Schizoaffective disorder  PTSD -resume  home regimen s/p med reconcilliation    DVT prophylaxis:heparin  Code Status: full/ default  Family Communication: .none at bedside Disposition Plan: patient  expected to be admitted greater than 2 midnights  Consults called: Neuro consulted by ID, will need to call neuro once patient arrives at [facility] Admission status: progressive care   Clance Boll MD [facility] If 7PM-7AM, please contact night-coverage [email or web address] Password [facility]  02/17/2022, 9:58 PM

## 2022-02-17 NOTE — ED Notes (Signed)
Patient transported to MRI 

## 2022-02-17 NOTE — ED Provider Notes (Signed)
[place] EMERGENCY DEPARTMENT AT [facility] Provider Note   CSN: [id number] Arrival date & time: 02/17/22  1431     History  Chief Complaint  Patient presents with   Fatigue    William Acevedo is a 67 y.o. male.  Pt is a 67 yo male with pmhx significant for dm, depression, htn, schizoaffective d/o, and polysubstance abuse.  Pt has been living with his brother.  Pt's brother called EMS today after pt has had decreased po intake for 3 days.  Pt was here at the beginning of January for a mental health issue.  He was cleared by psych and was d/c and instructed to f/u with the [facility].  It does not look like he's followed up with the [place].  The pt is unable to give any hx.  Per EMS, pt's brother said he's been vomiting.       Home Medications Prior to Admission medications   Medication Sig Start Date End Date Taking? Authorizing Provider  ACCU-CHEK GUIDE test strip  10/29/21   [provider]  albuterol (PROVENTIL) (2.5 MG/3ML) 0.083% nebulizer solution Take 3 mLs by nebulization every 6 (six) hours as needed. Patient not taking: Reported on 11/10/2021 12/02/20   [provider]  amLODipine (NORVASC) 10 MG tablet Take 10 mg by mouth daily. 06/26/20   [provider]  aspirin EC 81 MG EC tablet Take 1 tablet (81 mg total) by mouth daily. Swallow whole. 06/09/20   Little Ishikawa, MD  blood glucose meter kit and supplies KIT Dispense based on patient and insurance preference. Use up to four times daily as directed. (FOR ICD-9 250.00, 250.01). 09/13/19   Isla Pence, MD  clopidogrel (PLAVIX) 75 MG tablet Take 1 tablet (75 mg total) by mouth daily. Patient not taking: Reported on 01/08/2021 06/09/20   Little Ishikawa, MD  DULoxetine (CYMBALTA) 60 MG capsule Take 60 mg by mouth daily. For chronic pain and mood    [provider]  famotidine (PEPCID) 20 MG tablet Take 20 mg by mouth daily before breakfast.    [provider]   fluconazole (DIFLUCAN) 200 MG tablet Take 200 mg by mouth daily.    [provider]  folic acid (FOLVITE) 1 MG tablet Take 1 mg by mouth daily.    [provider]  gabapentin (NEURONTIN) 100 MG capsule Take 100-300 mg by mouth See admin instructions. Take 1 capsule by mouth twice a day, then take 3 capsules at bedtime    [provider]  glipiZIDE (GLUCOTROL) 10 MG tablet Take 10 mg by mouth daily before breakfast. Patient not taking: Reported on 11/10/2021    [provider]  hydrALAZINE (APRESOLINE) 10 MG tablet Take 10 mg by mouth in the morning and at bedtime.    [provider]  insulin aspart (NOVOLOG) 100 UNIT/ML injection Inject 6 Units into the skin See admin instructions. INJECT 6 UNITS UNDER THE SKIN THREE TIMES A DAY WITH MEALS FOR DIABETES  "DISCARD AND OPEN A NEW PEN EVERY 28 DAYS" Patient not taking: Reported on 01/24/2022 07/16/21   [provider]  insulin glargine-yfgn (SEMGLEE) 100 UNIT/ML Pen Inject 15 Units into the skin 2 (two) times daily.    [provider]  insulin lispro (HUMALOG) 100 UNIT/ML KwikPen Inject 0-30 Units into the skin as directed. INJECT SUBCUTANEOUSLY BASED ON BLOOD SUGAR RESULTS WITH MEALS THREE TIMES DAILY PRN FOR HIGH BLOOD SUGAR. MAX OF 30 UNITS IN 24 HOURS 01/27/21  Davonna Belling, MD  Insulin Pen Needle 29G X 12MM MISC Per instructions 04/08/19   [name], MD  JARDIANCE 25 MG TABS tablet Take 25 mg by mouth every morning. Patient not taking: Reported on 11/10/2021 12/14/20   [provider]  melatonin 3 MG TABS tablet Take 6 mg by mouth at bedtime as needed (for sleep).    [provider]  metoprolol succinate (TOPROL-XL) 50 MG 24 hr tablet Take 25 mg by mouth daily. Take with or immediately following a meal. Patient not taking: Reported on 01/24/2022    [provider]  nicotine polacrilex (COMMIT) 2 MG lozenge Take 2 mg by mouth as needed for smoking  cessation. Patient not taking: Reported on 11/11/2021    [provider]  OLANZapine (ZYPREXA) 5 MG tablet Take 5 mg by mouth at bedtime. 11/04/21   [provider]  ondansetron (ZOFRAN) 4 MG tablet Take 4 mg by mouth every 8 (eight) hours as needed for nausea or vomiting.    [provider]  pantoprazole (PROTONIX) 40 MG tablet Take 40 mg by mouth daily. Patient not taking: Reported on 11/10/2021 06/29/20   [provider]  polyethylene glycol powder (GLYCOLAX/MIRALAX) 17 GM/SCOOP powder Take 17 g by mouth 2 (two) times daily. Until daily soft stools  OTC Patient not taking: Reported on 11/10/2021 04/04/21   [name], PA-C  rivastigmine (EXELON) 1.5 MG capsule Take 1.5 mg by mouth 2 (two) times daily. Patient not taking: Reported on 11/11/2021    [provider]  rosuvastatin (CRESTOR) 40 MG tablet Take 40 mg by mouth daily.    [provider]  Suvorexant (BELSOMRA) 10 MG TABS Take 10 mg by mouth at bedtime.    [provider]  traMADol (ULTRAM) 50 MG tablet Take 50 mg by mouth every 6 (six) hours as needed. 08/27/21   [provider]  traZODone (DESYREL) 100 MG tablet Take 100 mg by mouth at bedtime. 10/01/21   [provider]      Allergies    Lisinopril and Omeprazole    Review of Systems   Review of Systems  Unable to perform ROS: Mental status change    Physical Exam Updated Vital Signs BP (!) 207/99   Pulse 99   Temp 98.5 F (36.9 C) (Oral)   Resp 15   SpO2 100%  Physical Exam Vitals and nursing note reviewed.  Constitutional:      Appearance: He is underweight. He is ill-appearing.  HENT:     Head: Normocephalic and atraumatic.     Right Ear: External ear normal.     Left Ear: External ear normal.     Nose: Nose normal.     Mouth/Throat:     Mouth: Mucous membranes are dry.  Eyes:     Extraocular Movements: Extraocular movements intact.     Conjunctiva/sclera: Conjunctivae  normal.     Pupils: Pupils are equal, round, and reactive to light.  Cardiovascular:     Rate and Rhythm: Normal rate and regular rhythm.     Pulses: Normal pulses.     Heart sounds: Normal heart sounds.  Pulmonary:     Effort: Pulmonary effort is normal.     Breath sounds: Normal breath sounds.  Abdominal:     General: Abdomen is flat. Bowel sounds are normal.     Palpations: Abdomen is soft.     Tenderness: There is generalized abdominal tenderness.  Musculoskeletal:        General:  Normal range of motion.     Cervical back: Normal range of motion and neck supple.  Skin:    General: Skin is warm.     Capillary Refill: Capillary refill takes less than 2 seconds.  Neurological:     Mental Status: He is alert.     Comments: Pt is moving all 4 extremities.  He is not answering any questions or following commands.     ED Results / Procedures / Treatments   Labs (all labs ordered are listed, but only abnormal results are displayed) Labs Reviewed  COMPREHENSIVE METABOLIC PANEL - Abnormal; Notable for the following components:      Result Value   Glucose, Bld 343 (*)    BUN 29 (*)    Calcium 8.7 (*)    All other components within normal limits  LACTIC ACID, PLASMA - Abnormal; Notable for the following components:   Lactic Acid, Venous 2.0 (*)    All other components within normal limits  CBG MONITORING, ED - Abnormal; Notable for the following components:   Glucose-Capillary 365 (*)    All other components within normal limits  RESP PANEL BY RT-PCR (RSV, FLU A&B, COVID)  [id number]  CBC WITH DIFFERENTIAL/PLATELET  AMMONIA  URINALYSIS, ROUTINE W REFLEX MICROSCOPIC  RAPID URINE DRUG SCREEN, HOSP PERFORMED    EKG EKG Interpretation  Date/Time:  Thursday February 17 2022 14:48:36 EST Ventricular Rate:  106 PR Interval:  138 QRS Duration: 78 QT Interval:  341 QTC Calculation: 453 R Axis:   50 Text Interpretation: Sinus tachycardia Right atrial enlargement Probable inferior  infarct, old Since last tracing rate faster Confirmed by [name] [id number]) on 02/17/2022 4:03:51 PM  Radiology CT Head Wo Contrast  Result Date: 02/17/2022 CLINICAL DATA:  Altered mental status, lethargy EXAM: CT HEAD WITHOUT CONTRAST TECHNIQUE: Contiguous axial images were obtained from the base of the skull through the vertex without intravenous contrast. RADIATION DOSE REDUCTION: This exam was performed according to the departmental dose-optimization program which includes automated exposure control, adjustment of the mA and/or kV according to patient size and/or use of iterative reconstruction technique. COMPARISON:  CT brain, 07/09/2020 FINDINGS: Brain: Extensive, multifocal bilateral encephalomalacia, some of which appears to be new or at least increased compared to prior examination, for example in the medial right frontal lobe (series 2, image 18) and in the right parietal vertex (series 2, image 18). No evidence of hemorrhage, hydrocephalus, extra-axial collection or mass lesion/mass effect. Vascular: No hyperdense vessel or unexpected calcification. Skull: Normal. Negative for fracture or focal lesion. Sinuses/Orbits: No acute finding. Other: None. IMPRESSION: Extensive, multifocal bilateral encephalomalacia, some of which appears to be new or at least increased compared to prior examination dated 07/09/2020 although is generally age indeterminate. Recommend MR to further evaluate if there is clinical suspicion for acute diffusion restricting infarction. Electronically Signed   By: Jearld Lesch M.D.   On: 02/17/2022 16:21   CT ABDOMEN PELVIS W CONTRAST  Result Date: 02/17/2022 CLINICAL DATA:  Abdominal pain.  Acute, nonlocalized. EXAM: CT ABDOMEN AND PELVIS WITH CONTRAST TECHNIQUE: Multidetector CT imaging of the abdomen and pelvis was performed using the standard protocol following bolus administration of intravenous contrast. RADIATION DOSE REDUCTION: This exam was performed according to the  departmental dose-optimization program which includes automated exposure control, adjustment of the mA and/or kV according to patient size and/or use of iterative reconstruction technique. CONTRAST:  OMNIPAQUE IOHEXOL 300 MG/ML  SOLN COMPARISON:  None Available. FINDINGS: Lower chest: Mild atelectasis in the  RIGHT middle lobe. Otherwise lung bases clear. Hepatobiliary: No focal hepatic lesion. Normal gallbladder. No biliary duct dilatation. Common bile duct is normal. Pancreas: Pancreas normal Spleen: Normal spleen Adrenals/urinary tract: Adrenal glands normal. Bilateral nonobstructing renal calculi. No enhancing renal cortical lesion. Cortical scarring in the RIGHT kidney. Bladder unremarkable Stomach/Bowel: Stomach, small-bowel and cecum are normal. The appendix is not identified but there is no pericecal inflammation to suggest appendicitis. The colon and rectosigmoid colon are normal. Vascular/Lymphatic: Abdominal aorta is normal caliber with atherosclerotic calcification. There is no retroperitoneal or periportal lymphadenopathy. No pelvic lymphadenopathy. Reproductive: Prostate unremarkable Other: Very little intra-abdominal fat.  No free fluid or free air. Musculoskeletal: No aggressive osseous lesion. IMPRESSION: 1. No acute findings in the abdomen pelvis. 2. Bilateral nonobstructing renal calculi. 3. Very little intra-abdominal fat. 4.  Aortic Atherosclerosis (ICD10-I70.0). Electronically Signed   By: Genevive Bi M.D.   On: 02/17/2022 16:18   DG Chest Portable 1 View  Result Date: 02/17/2022 CLINICAL DATA:  Altered mental status EXAM: PORTABLE CHEST 1 VIEW COMPARISON:  CXR 01/23/22 FINDINGS: No pleural effusion. No pneumothorax. No focal airspace opacity. Normal cardiac and mediastinal contours. No radiographically apparent displaced rib fractures. Visualized upper abdomen is unremarkable. IMPRESSION: No active disease. Electronically Signed   By: Lorenza Cambridge M.D.   On: 02/17/2022 15:17     Procedures Procedures    Medications Ordered in ED Medications  sodium chloride 0.9 % bolus 1,000 mL (1,000 mLs Intravenous New Bag/Given 02/17/22 1502)  ondansetron (ZOFRAN) injection 4 mg (4 mg Intravenous Given 02/17/22 1502)  iohexol (OMNIPAQUE) 300 MG/ML solution 100 mL (100 mLs Intravenous Contrast Given 02/17/22 1600)    ED Course/ Medical Decision Making/ A&P                             Medical Decision Making Amount and/or Complexity of Data Reviewed Labs: ordered. Radiology: ordered.  Risk Prescription drug management.   This patient presents to the ED for concern of ams, this involves an extensive number of treatment options, and is a complaint that carries with it a high risk of complications and morbidity.  The differential diagnosis includes infection, psych d/o, electrolyte abn, cva   Co morbidities that complicate the patient evaluation  dm, depression, htn, schizoaffective d/o, and polysubstance abuse   Additional history obtained:  Additional history obtained from epic chart review External records from outside source obtained and reviewed including EMS report   Lab Tests:  I Ordered, and personally interpreted labs.  The pertinent results include:  cbc nl, cmp nl other than glucose elevated at 343 and bun elevated at 29; ammonia nl; lactic 2.0   Imaging Studies ordered:  I ordered imaging studies including ct head, ct abd/pelvis, cxr  I independently visualized and interpreted imaging which showed  CT head: Extensive, multifocal bilateral encephalomalacia, some of which  appears to be new or at least increased compared to prior  examination dated 07/09/2020 although is generally age  indeterminate. Recommend MR to further evaluate if there is clinical  suspicion for acute diffusion restricting infarction.  CT abd/pelvis: No acute findings in the abdomen pelvis.  2. Bilateral nonobstructing renal calculi.  3. Very little intra-abdominal fat.   4.  Aortic Atherosclerosis (ICD10-I70.0).  CXR: No active disease.  I agree with the radiologist interpretation   Cardiac Monitoring:  The patient was maintained on a cardiac monitor.  I personally viewed and interpreted the cardiac monitored which showed an  underlying rhythm of: nsr   Medicines ordered and prescription drug management:  I ordered medication including IVFs and zofran  for dehydration and nausea  Reevaluation of the patient after these medicines showed that the patient improved I have reviewed the patients home medicines and have made adjustments as needed   Test Considered:  Ct/mri   Critical Interventions:  ivfs   Problem List / ED Course:  Dehydration:  IVFs given   Reevaluation:  After the interventions noted above, I reevaluated the patient and found that they have :improved   Social Determinants of Health:  Lives at home   Dispostion:  Pending at shift change.  Pt signed out at shift change to Dr. Roderic Palau.        Final Clinical Impression(s) / ED Diagnoses Final diagnoses:  Dehydration  Hyperglycemia    Rx / DC Orders ED Discharge Orders     None         Isla Pence, MD 02/17/22 1635

## 2022-02-17 NOTE — ED Notes (Signed)
Pt pushed legs between rail and stood up, urinated all over the floor. Pt assisted back in to bed- male purewick placed and brief. Instructed pt not to get up and not to urinate on the floor. Pt still confused at this time.

## 2022-02-17 NOTE — H&P (Incomplete)
History and Physical    William Acevedo UYQ:[id number] DOB: [birth year] DOA: 02/17/2022  PCP: [facility]  Patient coming from: ***  I have personally briefly reviewed patient's old medical records in [facility]  Chief Complaint: ***  HPI: William Acevedo is a 67 y.o. male with medical history significant of  CAD s/p NSTEMI, hyperlipedemia, DMII insulin dependent, cerebral thrombosis with CVA, Essential hypertension, hx of polysubstance abuse , hx of endocarditis, hx of pulmonary cryptococosis, GERD, etoh abuse, Schizoaffectic disorder ,PTSD  ED Course: ***  Review of Systems: As per HPI otherwise 10 point review of systems negative.   Past Medical History:  Diagnosis Date  . Carpal tunnel syndrome   . Depression   . Diabetes mellitus without complication (HCC)   . Hypertensive urgency 04/14/2020  . Neuropathy     Past Surgical History:  Procedure Laterality Date  . BUBBLE STUDY  03/30/2020   Procedure: BUBBLE STUDY;  Surgeon: Jake Bathe, MD;  Location: [facility] ENDOSCOPY;  Service: Cardiovascular;;  . TEE WITHOUT CARDIOVERSION N/A 03/30/2020   Procedure: TRANSESOPHAGEAL ECHOCARDIOGRAM (TEE);  Surgeon: Jake Bathe, MD;  Location: [facility] ENDOSCOPY;  Service: Cardiovascular;  Laterality: N/A;  . TESTICLE TORSION REDUCTION       reports that he has been smoking cigarettes. He has been smoking an average of .5 packs per day. He has never used smokeless tobacco. He reports current alcohol use. He reports current drug use. Drug: Cocaine.  Allergies  Allergen Reactions  . Lisinopril Swelling    Angioedema Pt reports not allergic 06/07/20  . Omeprazole     Other reaction(s): ANGIOEDEMA OF LIPS, Angioedema of tongue. Pt reports not allergic 06/07/20    History reviewed. No pertinent family history. *** Prior to Admission medications   Medication Sig Start Date End Date Taking? Authorizing Provider  ACCU-CHEK GUIDE test strip  10/29/21   [provider]  albuterol (PROVENTIL) (2.5 MG/3ML) 0.083% nebulizer solution Take 3 mLs by nebulization every 6 (six) hours as needed. Patient not taking: Reported on 11/10/2021 12/02/20   [provider]  amLODipine (NORVASC) 10 MG tablet Take 10 mg by mouth daily. 06/26/20   [provider]  aspirin EC 81 MG EC tablet Take 1 tablet (81 mg total) by mouth daily. Swallow whole. 06/09/20   William Fallen, MD  blood glucose meter kit and supplies KIT Dispense based on patient and insurance preference. Use up to four times daily as directed. (FOR ICD-9 250.00, 250.01). 09/13/19   William Lefevre, MD  clopidogrel (PLAVIX) 75 MG tablet Take 1 tablet (75 mg total) by mouth daily. Patient not taking: Reported on 01/08/2021 06/09/20   William Fallen, MD  DULoxetine (CYMBALTA) 60 MG capsule Take 60 mg by mouth daily. For chronic pain and mood    [provider]  famotidine (PEPCID) 20 MG tablet Take 20 mg by mouth daily before breakfast.    [provider]  fluconazole (DIFLUCAN) 200 MG tablet Take 200 mg by mouth daily.    [provider]  folic acid (FOLVITE) 1 MG tablet Take 1 mg by mouth daily.    [provider]  gabapentin (NEURONTIN) 100 MG capsule Take 100-300 mg by mouth See admin instructions. Take 1 capsule by mouth twice a day, then take 3 capsules at bedtime    [provider]  glipiZIDE (GLUCOTROL) 10 MG tablet Take 10 mg by mouth daily before breakfast. Patient not taking: Reported on 11/10/2021  [provider]  hydrALAZINE (APRESOLINE) 10 MG tablet Take 10 mg by mouth in the morning and at bedtime.    [provider]  insulin aspart (NOVOLOG) 100 UNIT/ML injection Inject 6 Units into the skin See admin instructions. INJECT 6 UNITS UNDER THE SKIN THREE TIMES A DAY WITH MEALS FOR DIABETES  "DISCARD AND OPEN A NEW PEN EVERY 28 DAYS" Patient not taking: Reported on 01/24/2022 07/16/21   [provider]  insulin  glargine-yfgn (SEMGLEE) 100 UNIT/ML Pen Inject 15 Units into the skin 2 (two) times daily.    [provider]  insulin lispro (HUMALOG) 100 UNIT/ML KwikPen Inject 0-30 Units into the skin as directed. INJECT SUBCUTANEOUSLY BASED ON BLOOD SUGAR RESULTS WITH MEALS THREE TIMES DAILY PRN FOR HIGH BLOOD SUGAR. MAX OF 30 UNITS IN 24 HOURS 01/27/21   Davonna Belling, MD  Insulin Pen Needle 29G X 12MM MISC Per instructions 04/08/19   [name], MD  JARDIANCE 25 MG TABS tablet Take 25 mg by mouth every morning. Patient not taking: Reported on 11/10/2021 12/14/20   [provider]  melatonin 3 MG TABS tablet Take 6 mg by mouth at bedtime as needed (for sleep).    [provider]  metoprolol succinate (TOPROL-XL) 50 MG 24 hr tablet Take 25 mg by mouth daily. Take with or immediately following a meal. Patient not taking: Reported on 01/24/2022    [provider]  nicotine polacrilex (COMMIT) 2 MG lozenge Take 2 mg by mouth as needed for smoking cessation. Patient not taking: Reported on 11/11/2021    [provider]  OLANZapine (ZYPREXA) 5 MG tablet Take 5 mg by mouth at bedtime. 11/04/21   [provider]  ondansetron (ZOFRAN) 4 MG tablet Take 4 mg by mouth every 8 (eight) hours as needed for nausea or vomiting.    [provider]  pantoprazole (PROTONIX) 40 MG tablet Take 40 mg by mouth daily. Patient not taking: Reported on 11/10/2021 06/29/20   [provider]  polyethylene glycol powder (GLYCOLAX/MIRALAX) 17 GM/SCOOP powder Take 17 g by mouth 2 (two) times daily. Until daily soft stools  OTC Patient not taking: Reported on 11/10/2021 04/04/21   William Circle, PA-C  rivastigmine (EXELON) 1.5 MG capsule Take 1.5 mg by mouth 2 (two) times daily. Patient not taking: Reported on 11/11/2021    [provider]  rosuvastatin (CRESTOR) 40 MG tablet Take 40 mg by mouth daily.    [provider]  Suvorexant (BELSOMRA)  10 MG TABS Take 10 mg by mouth at bedtime.    [provider]  traMADol (ULTRAM) 50 MG tablet Take 50 mg by mouth every 6 (six) hours as needed. 08/27/21   [provider]  traZODone (DESYREL) 100 MG tablet Take 100 mg by mouth at bedtime. 10/01/21   [provider]    Physical Exam: Vitals:   02/17/22 1930 02/17/22 2030 02/17/22 2047 02/17/22 2130  BP: (!) 178/87  (!) 149/94 (!) 199/102  Pulse: 100 89  98  Resp: 15 18  17   Temp:      TempSrc:      SpO2: 99% 100%  98%    Constitutional: NAD, calm, comfortable Vitals:   02/17/22 1930 02/17/22 2030 02/17/22 2047 02/17/22 2130  BP: (!) 178/87  (!) 149/94 (!) 199/102  Pulse: 100 89  98  Resp: 15 18  17   Temp:      TempSrc:      SpO2: 99% 100%  98%  Eyes: PERRL, lids and conjunctivae normal ENMT: Mucous membranes are moist. Posterior pharynx clear of any exudate or lesions.Normal dentition.  Neck: normal, supple, no masses, no thyromegaly Respiratory: clear to auscultation bilaterally, no wheezing, no crackles. Normal respiratory effort. No accessory muscle use.  Cardiovascular: Regular rate and rhythm, no murmurs / rubs / gallops. No extremity edema. 2+ pedal pulses. No carotid bruits.  Abdomen: no tenderness, no masses palpated. No hepatosplenomegaly. Bowel sounds positive.  Musculoskeletal: no clubbing / cyanosis. No joint deformity upper and lower extremities. Good ROM, no contractures. Normal muscle tone.  Skin: no rashes, lesions, ulcers. No induration Neurologic: CN 2-12 grossly intact. Sensation intact, DTR normal. Strength 5/5 in all 4.  Psychiatric: Normal judgment and insight. Alert and oriented x 3. Normal mood.    Labs on Admission: I have personally reviewed following labs and imaging studies  CBC: Recent Labs  Lab 02/17/22 1513  WBC 6.5  NEUTROABS 5.0  HGB 14.6  HCT 47.1  MCV 89.4  PLT 329   Basic Metabolic Panel: Recent Labs  Lab 02/17/22 1513  NA 141  K 4.1  CL 105  CO2  23  GLUCOSE 343*  BUN 29*  CREATININE 1.07  CALCIUM 8.7*   GFR: CrCl cannot be calculated (Unknown ideal weight.). Liver Function Tests: Recent Labs  Lab 02/17/22 1513  AST 30  ALT 23  ALKPHOS 58  BILITOT 0.8  PROT 7.4  ALBUMIN 3.6   Recent Labs  Lab 02/17/22 2119  LIPASE 31   Recent Labs  Lab 02/17/22 1514  AMMONIA <10   Coagulation Profile: No results for input(s): "INR", "PROTIME" in the last 168 hours. Cardiac Enzymes: No results for input(s): "CKTOTAL", "CKMB", "CKMBINDEX", "TROPONINI" in the last 168 hours. BNP (last 3 results) No results for input(s): "PROBNP" in the last 8760 hours. HbA1C: No results for input(s): "HGBA1C" in the last 72 hours. CBG: Recent Labs  Lab 02/17/22 1517  GLUCAP 365*   Lipid Profile: No results for input(s): "CHOL", "HDL", "LDLCALC", "TRIG", "CHOLHDL", "LDLDIRECT" in the last 72 hours. Thyroid Function Tests: No results for input(s): "TSH", "T4TOTAL", "FREET4", "T3FREE", "THYROIDAB" in the last 72 hours. Anemia Panel: No results for input(s): "VITAMINB12", "FOLATE", "FERRITIN", "TIBC", "IRON", "RETICCTPCT" in the last 72 hours. Urine analysis:    Component Value Date/Time   COLORURINE STRAW (A) 02/17/2022 1443   APPEARANCEUR CLEAR 02/17/2022 1443   LABSPEC 1.028 02/17/2022 1443   PHURINE 5.0 02/17/2022 1443   GLUCOSEU >=500 (A) 02/17/2022 1443   HGBUR NEGATIVE 02/17/2022 1443   BILIRUBINUR NEGATIVE 02/17/2022 1443   KETONESUR 20 (A) 02/17/2022 1443   PROTEINUR 30 (A) 02/17/2022 1443   NITRITE NEGATIVE 02/17/2022 1443   LEUKOCYTESUR NEGATIVE 02/17/2022 1443    Radiological Exams on Admission: CT Angio Head W or Wo Contrast  Result Date: 02/17/2022 CLINICAL DATA:  Stroke follow-up EXAM: CT ANGIOGRAPHY HEAD TECHNIQUE: Multidetector CT imaging of the head was performed using the standard protocol during bolus administration of intravenous contrast. Multiplanar CT image reconstructions and MIPs were obtained to evaluate  the vascular anatomy. RADIATION DOSE REDUCTION: This exam was performed according to the departmental dose-optimization program which includes automated exposure control, adjustment of the mA and/or kV according to patient size and/or use of iterative reconstruction technique. CONTRAST:  23mL OMNIPAQUE IOHEXOL 350 MG/ML SOLN COMPARISON:  Brain MRI 02/17/2022 FINDINGS: POSTERIOR CIRCULATION: --Vertebral arteries: Normal --Inferior cerebellar arteries: Normal. --Basilar artery: Normal. --Superior cerebellar arteries: Normal. --Posterior cerebral arteries: Normal. ANTERIOR CIRCULATION: --Intracranial internal carotid arteries: Severe stenosis  of the proximal cavernous segment of the left ICA. Bilateral atherosclerotic calcification. --Anterior cerebral arteries (ACA): Left A2 segment occlusion, corresponding to the infarct seen on concomitant brain MRI. --Middle cerebral arteries (MCA): Normal. Venous sinuses: As permitted by contrast timing, patent. Anatomic variants: None Review of the MIP images confirms the above findings. IMPRESSION: 1. Left A2 segment occlusion, corresponding to the infarct seen on concomitant brain MRI. 2. Severe stenosis of the proximal cavernous segment of the left ICA. Electronically Signed   By: Ulyses Jarred M.D.   On: 02/17/2022 20:46   MR BRAIN WO CONTRAST  Result Date: 02/17/2022 CLINICAL DATA:  Stroke suspected EXAM: MRI HEAD WITHOUT CONTRAST TECHNIQUE: Multiplanar, multiecho pulse sequences of the brain and surrounding structures were obtained without intravenous contrast. COMPARISON:  07/10/2020 MRI head, correlation is made with CT head 02/17/2022 FINDINGS: Evaluation is somewhat limited by motion artifact. Brain: Restricted diffusion with ADC correlate in the medial left frontal lobe, primarily cortical and subcortical, extending from the anterior inferior left frontal lobe to the more superior and posterior left frontal lobe (series 5, images 15-27, 29, and 31), correlating with  the left ACA territory and ACA-PCA watershed. These areas are associated with T2 hyperintense signal, likely cytotoxic edema. No acute hemorrhage, mass, mass effect, or midline shift. No hydrocephalus or extra-axial collection. Remote infarcts involving the bilateral occipital cortex, right parietal cortex, and posterior left frontal lobe white matter. Confluent T2 hyperintense signal in the periventricular white matter, likely the sequela of moderate to severe chronic small vessel ischemic disease. Vascular: Normal proximal arterial flow voids. Skull and upper cervical spine: Normal marrow signal. Sinuses/Orbits: Mucosal thickening in the posterior left ethmoid air cells. No acute finding in the orbits. Other: The mastoids are well aerated. IMPRESSION: 1. Acute to early subacute infarct in the medial left frontal lobe, correlating with the left ACA territory and ACA-PCA watershed. No evidence of hemorrhagic transformation or significant mass effect. 2. Remote infarcts involving the bilateral occipital cortex, right parietal cortex, and posterior left frontal lobe white matter. These results were called by telephone at the time of interpretation on 02/17/2022 at 7:02 pm to provider ZAMMIT , who verbally acknowledged these results. Electronically Signed   By: Merilyn Baba M.D.   On: 02/17/2022 19:03   CT Head Wo Contrast  Result Date: 02/17/2022 CLINICAL DATA:  Altered mental status, lethargy EXAM: CT HEAD WITHOUT CONTRAST TECHNIQUE: Contiguous axial images were obtained from the base of the skull through the vertex without intravenous contrast. RADIATION DOSE REDUCTION: This exam was performed according to the departmental dose-optimization program which includes automated exposure control, adjustment of the mA and/or kV according to patient size and/or use of iterative reconstruction technique. COMPARISON:  CT brain, 07/09/2020 FINDINGS: Brain: Extensive, multifocal bilateral encephalomalacia, some of which  appears to be new or at least increased compared to prior examination, for example in the medial right frontal lobe (series 2, image 18) and in the right parietal vertex (series 2, image 18). No evidence of hemorrhage, hydrocephalus, extra-axial collection or mass lesion/mass effect. Vascular: No hyperdense vessel or unexpected calcification. Skull: Normal. Negative for fracture or focal lesion. Sinuses/Orbits: No acute finding. Other: None. IMPRESSION: Extensive, multifocal bilateral encephalomalacia, some of which appears to be new or at least increased compared to prior examination dated 07/09/2020 although is generally age indeterminate. Recommend MR to further evaluate if there is clinical suspicion for acute diffusion restricting infarction. Electronically Signed   By: Delanna Ahmadi M.D.   On: 02/17/2022 16:21  CT ABDOMEN PELVIS W CONTRAST  Result Date: 02/17/2022 CLINICAL DATA:  Abdominal pain.  Acute, nonlocalized. EXAM: CT ABDOMEN AND PELVIS WITH CONTRAST TECHNIQUE: Multidetector CT imaging of the abdomen and pelvis was performed using the standard protocol following bolus administration of intravenous contrast. RADIATION DOSE REDUCTION: This exam was performed according to the departmental dose-optimization program which includes automated exposure control, adjustment of the mA and/or kV according to patient size and/or use of iterative reconstruction technique. CONTRAST:  OMNIPAQUE IOHEXOL 300 MG/ML  SOLN COMPARISON:  None Available. FINDINGS: Lower chest: Mild atelectasis in the RIGHT middle lobe. Otherwise lung bases clear. Hepatobiliary: No focal hepatic lesion. Normal gallbladder. No biliary duct dilatation. Common bile duct is normal. Pancreas: Pancreas normal Spleen: Normal spleen Adrenals/urinary tract: Adrenal glands normal. Bilateral nonobstructing renal calculi. No enhancing renal cortical lesion. Cortical scarring in the RIGHT kidney. Bladder unremarkable Stomach/Bowel: Stomach,  small-bowel and cecum are normal. The appendix is not identified but there is no pericecal inflammation to suggest appendicitis. The colon and rectosigmoid colon are normal. Vascular/Lymphatic: Abdominal aorta is normal caliber with atherosclerotic calcification. There is no retroperitoneal or periportal lymphadenopathy. No pelvic lymphadenopathy. Reproductive: Prostate unremarkable Other: Very little intra-abdominal fat.  No free fluid or free air. Musculoskeletal: No aggressive osseous lesion. IMPRESSION: 1. No acute findings in the abdomen pelvis. 2. Bilateral nonobstructing renal calculi. 3. Very little intra-abdominal fat. 4.  Aortic Atherosclerosis (ICD10-I70.0). Electronically Signed   By: Genevive Bi M.D.   On: 02/17/2022 16:18   DG Chest Portable 1 View  Result Date: 02/17/2022 CLINICAL DATA:  Altered mental status EXAM: PORTABLE CHEST 1 VIEW COMPARISON:  CXR 01/23/22 FINDINGS: No pleural effusion. No pneumothorax. No focal airspace opacity. Normal cardiac and mediastinal contours. No radiographically apparent displaced rib fractures. Visualized upper abdomen is unremarkable. IMPRESSION: No active disease. Electronically Signed   By: Lorenza Cambridge M.D.   On: 02/17/2022 15:17    EKG: Independently reviewed. ***  Assessment/Plan Principal Problem:   CVA (cerebral vascular accident) (HCC)   ***  DVT prophylaxis: *** (Lovenox/Heparin/SCD's/anticoagulated/None (if comfort care) Code Status: *** (Full/Partial (specify details) Family Communication: *** (Specify name, relationship. Do not write "discussed with patient". Specify tel # if discussed over the phone) Disposition Plan: *** (specify when and where you expect patient to be discharged) Consults called: *** (with names) Admission status: *** (inpatient / obs / tele / medical floor / SDU)   Lurline Del MD [facility] Pager [phone]  If 7PM-7AM, please contact night-coverage [email or web address] Password [facility]  02/17/2022,  9:58 PM

## 2022-02-17 NOTE — ED Notes (Signed)
Pt. Urinated on floor, cleaned up.pt. male pure-wick and brief applied.

## 2022-02-18 ENCOUNTER — Inpatient Hospital Stay (HOSPITAL_COMMUNITY): Payer: No Typology Code available for payment source

## 2022-02-18 DIAGNOSIS — I63522 Cerebral infarction due to unspecified occlusion or stenosis of left anterior cerebral artery: Secondary | ICD-10-CM | POA: Diagnosis not present

## 2022-02-18 DIAGNOSIS — I6300 Cerebral infarction due to thrombosis of unspecified precerebral artery: Secondary | ICD-10-CM | POA: Diagnosis not present

## 2022-02-18 DIAGNOSIS — I6389 Other cerebral infarction: Secondary | ICD-10-CM

## 2022-02-18 DIAGNOSIS — I639 Cerebral infarction, unspecified: Secondary | ICD-10-CM | POA: Diagnosis not present

## 2022-02-18 LAB — LIPID PANEL
Cholesterol: 291 mg/dL — ABNORMAL HIGH (ref 0–200)
HDL: 56 mg/dL (ref >40)
LDL Cholesterol: 217 mg/dL — ABNORMAL HIGH (ref 0–99)
Total CHOL/HDL Ratio: 5.2 RATIO
Triglycerides: 88 mg/dL (ref <150)
VLDL: 18 mg/dL (ref 0–40)

## 2022-02-18 LAB — COMPREHENSIVE METABOLIC PANEL
ALT: 21 U/L (ref 0–44)
AST: 27 U/L (ref 15–41)
Albumin: 3.7 g/dL (ref 3.5–5.0)
Alkaline Phosphatase: 62 U/L (ref 38–126)
Anion gap: 17 — ABNORMAL HIGH (ref 5–15)
BUN: 20 mg/dL (ref 8–23)
CO2: 19 mmol/L — ABNORMAL LOW (ref 22–32)
Calcium: 9.1 mg/dL (ref 8.9–10.3)
Chloride: 108 mmol/L (ref 98–111)
Creatinine, Ser: 1.02 mg/dL (ref 0.61–1.24)
GFR, Estimated: >60 mL/min (ref >60)
Glucose, Bld: 208 mg/dL — ABNORMAL HIGH (ref 70–99)
Potassium: 4 mmol/L (ref 3.5–5.1)
Sodium: 144 mmol/L (ref 135–145)
Total Bilirubin: 0.8 mg/dL (ref 0.3–1.2)
Total Protein: 7.3 g/dL (ref 6.5–8.1)

## 2022-02-18 LAB — HIV ANTIBODY (ROUTINE TESTING W REFLEX): HIV Screen 4th Generation wRfx: NONREACTIVE

## 2022-02-18 LAB — ECHOCARDIOGRAM LIMITED BUBBLE STUDY
AR max vel: 2.84 cm2
AV Area VTI: 3.01 cm2
AV Area mean vel: 3.17 cm2
AV Mean grad: 1 mmHg
AV Peak grad: 2.6 mmHg
Ao pk vel: 0.81 m/s
Area-P 1/2: 3.97 cm2
Calc EF: 59.5 %
S' Lateral: 2.4 cm
Single Plane A2C EF: 59.4 %
Single Plane A4C EF: 60 %

## 2022-02-18 LAB — CBC
HCT: 49.1 % (ref 39.0–52.0)
Hemoglobin: 15.2 g/dL (ref 13.0–17.0)
MCH: 27.8 pg (ref 26.0–34.0)
MCHC: 31 g/dL (ref 30.0–36.0)
MCV: 89.9 fL (ref 80.0–100.0)
Platelets: 227 10*3/uL (ref 150–400)
RBC: 5.46 MIL/uL (ref 4.22–5.81)
RDW: 13 % (ref 11.5–15.5)
WBC: 6.7 10*3/uL (ref 4.0–10.5)
nRBC: 0 % (ref 0.0–0.2)

## 2022-02-18 LAB — GLUCOSE, CAPILLARY
Glucose-Capillary: 364 mg/dL — ABNORMAL HIGH (ref 70–99)
Glucose-Capillary: 99 mg/dL (ref 70–99)
Glucose-Capillary: 117 mg/dL — ABNORMAL HIGH (ref 70–99)
Glucose-Capillary: 178 mg/dL — ABNORMAL HIGH (ref 70–99)
Glucose-Capillary: 71 mg/dL (ref 70–99)

## 2022-02-18 MED ORDER — INSULIN ASPART 100 UNIT/ML IJ SOLN
0.0000 [IU] | Freq: Three times a day (TID) | INTRAMUSCULAR | Status: DC
  Administered 2022-02-18: 2 [IU] via SUBCUTANEOUS
  Administered 2022-02-19 – 2022-02-20 (×2): 3 [IU] via SUBCUTANEOUS
  Administered 2022-02-20 – 2022-02-21 (×2): 2 [IU] via SUBCUTANEOUS
  Administered 2022-02-21: 3 [IU] via SUBCUTANEOUS
  Administered 2022-02-22: 2 [IU] via SUBCUTANEOUS
  Administered 2022-02-22: 3 [IU] via SUBCUTANEOUS
  Administered 2022-02-23: 5 [IU] via SUBCUTANEOUS
  Administered 2022-02-23: 1 [IU] via SUBCUTANEOUS
  Administered 2022-02-24: 5 [IU] via SUBCUTANEOUS
  Administered 2022-02-24: 7 [IU] via SUBCUTANEOUS
  Administered 2022-02-24: 5 [IU] via SUBCUTANEOUS
  Filled 2022-02-18: qty 0.09

## 2022-02-18 MED ORDER — OLANZAPINE 2.5 MG PO TABS
5.0000 mg | ORAL_TABLET | Freq: Every day | ORAL | Status: DC
  Administered 2022-02-18 – 2022-02-23 (×6): 5 mg via ORAL
  Filled 2022-02-18 (×6): qty 2

## 2022-02-18 MED ORDER — TRAZODONE HCL 100 MG PO TABS
100.0000 mg | ORAL_TABLET | Freq: Every day | ORAL | Status: DC
  Administered 2022-02-18 – 2022-02-23 (×6): 100 mg via ORAL
  Filled 2022-02-18 (×6): qty 1

## 2022-02-18 MED ORDER — ATORVASTATIN CALCIUM 80 MG PO TABS
80.0000 mg | ORAL_TABLET | Freq: Every day | ORAL | Status: DC
  Administered 2022-02-18 – 2022-02-23 (×6): 80 mg via ORAL
  Filled 2022-02-18 (×6): qty 1

## 2022-02-18 MED ORDER — HALOPERIDOL LACTATE 5 MG/ML IJ SOLN
5.0000 mg | Freq: Once | INTRAMUSCULAR | Status: AC
  Administered 2022-02-18: 5 mg via INTRAVENOUS
  Filled 2022-02-18: qty 1

## 2022-02-18 MED ORDER — PANTOPRAZOLE SODIUM 40 MG IV SOLR
40.0000 mg | INTRAVENOUS | Status: DC
  Administered 2022-02-18: 40 mg via INTRAVENOUS
  Filled 2022-02-18: qty 10

## 2022-02-18 MED ORDER — INSULIN GLARGINE-YFGN 100 UNIT/ML ~~LOC~~ SOLN
20.0000 [IU] | Freq: Every day | SUBCUTANEOUS | Status: DC
  Administered 2022-02-18 – 2022-02-19 (×2): 20 [IU] via SUBCUTANEOUS
  Filled 2022-02-18 (×3): qty 0.2

## 2022-02-18 MED ORDER — HYDRALAZINE HCL 20 MG/ML IJ SOLN
10.0000 mg | INTRAMUSCULAR | Status: DC | PRN
  Administered 2022-02-18: 10 mg via INTRAVENOUS
  Filled 2022-02-18: qty 1

## 2022-02-18 MED ORDER — METOPROLOL SUCCINATE ER 25 MG PO TB24
25.0000 mg | ORAL_TABLET | Freq: Every day | ORAL | Status: DC
  Administered 2022-02-18 – 2022-02-24 (×7): 25 mg via ORAL
  Filled 2022-02-18 (×7): qty 1

## 2022-02-18 MED ORDER — HYDRALAZINE HCL 20 MG/ML IJ SOLN: 10.0000 mg | INTRAMUSCULAR | Status: DC | PRN

## 2022-02-18 MED ORDER — GABAPENTIN 300 MG PO CAPS
300.0000 mg | ORAL_CAPSULE | Freq: Two times a day (BID) | ORAL | Status: DC
  Administered 2022-02-18 – 2022-02-24 (×12): 300 mg via ORAL
  Filled 2022-02-18 (×13): qty 1

## 2022-02-18 MED ORDER — FAMOTIDINE 20 MG PO TABS
20.0000 mg | ORAL_TABLET | Freq: Every day | ORAL | Status: DC
  Administered 2022-02-19 – 2022-02-24 (×5): 20 mg via ORAL
  Filled 2022-02-18 (×6): qty 1

## 2022-02-18 MED ORDER — ATORVASTATIN CALCIUM 40 MG PO TABS: 40.0000 mg | ORAL_TABLET | Freq: Every day | ORAL | Status: DC

## 2022-02-18 MED ORDER — FOLIC ACID 1 MG PO TABS
1.0000 mg | ORAL_TABLET | Freq: Every day | ORAL | Status: DC
  Administered 2022-02-18 – 2022-02-24 (×7): 1 mg via ORAL
  Filled 2022-02-18 (×7): qty 1

## 2022-02-18 MED ORDER — GABAPENTIN 100 MG PO CAPS: 100.0000 mg | ORAL_CAPSULE | ORAL | Status: DC

## 2022-02-18 MED ORDER — MELATONIN 3 MG PO TABS: 6.0000 mg | ORAL_TABLET | Freq: Every evening | ORAL | Status: DC | PRN

## 2022-02-18 MED ORDER — PANTOPRAZOLE SODIUM 40 MG PO TBEC
40.0000 mg | DELAYED_RELEASE_TABLET | Freq: Every day | ORAL | Status: DC
  Administered 2022-02-19 – 2022-02-24 (×6): 40 mg via ORAL
  Filled 2022-02-18 (×6): qty 1

## 2022-02-18 MED ORDER — DAPAGLIFLOZIN PROPANEDIOL 10 MG PO TABS
10.0000 mg | ORAL_TABLET | Freq: Every day | ORAL | Status: DC
  Administered 2022-02-19 – 2022-02-24 (×6): 10 mg via ORAL
  Filled 2022-02-18 (×7): qty 1

## 2022-02-18 MED ORDER — DULOXETINE HCL 60 MG PO CPEP
60.0000 mg | ORAL_CAPSULE | Freq: Every day | ORAL | Status: DC
  Administered 2022-02-18 – 2022-02-24 (×7): 60 mg via ORAL
  Filled 2022-02-18 (×7): qty 1

## 2022-02-18 MED ORDER — INSULIN ASPART 100 UNIT/ML IJ SOLN
8.0000 [IU] | Freq: Once | INTRAMUSCULAR | Status: AC
  Administered 2022-02-18: 8 [IU] via SUBCUTANEOUS

## 2022-02-18 NOTE — ED Notes (Signed)
[facility] called for transport 

## 2022-02-18 NOTE — Progress Notes (Signed)
PROGRESS NOTE    William Acevedo  MGQ:[id number] DOB: Aug 15, 1955 DOA: 02/17/2022 PCP: [facility]   Brief Narrative:  HPI: William Acevedo is a 67 y.o. male with medical history significant of  CAD s/p NSTEMI, hyperlipedemia, DMII insulin dependent, cerebral thrombosis with CVA, Essential hypertension, hx of polysubstance abuse , hx of endocarditis, hx of pulmonary cryptococosis, GERD, etoh abuse, Schizoaffectic disorder ,PTSD Who presents to ED with increase lethargy , vomiting decreased intake x 3 days  as well as being nonverbal which is not patient baseline.  Patient currently unable to give history , but is able to follow commands he answers yes to all questions.  ED Course:  Afeb, BP 188/89, HR 103, rr 20  Sat 99% on ra  Na 141, K 4.1, glu 343, cr 1.07  Wbc 6.5, hgb 14.6, plt 235 Lactic acid 2, repeat 1.3 OIZ:TIWPY tachycardia , RAE Ua:neg UDS, + cocaine  Assessment & Plan:   Principal Problem:   CVA (cerebral vascular accident) ([place]) Active Problems:   Alcohol use disorder   Polysubstance abuse ([place])   DM2 (diabetes mellitus, type 2) ([place])   Acute encephalopathy   NSTEMI (non-ST elevated myocardial infarction) ([place])   Cocaine abuse ([place])   Schizoaffective disorder ([place])  Acute encephalopathy/acute to early subacute ischemic infarct in medial left frontal lobe correlating with left ACA territory and ACA-PCA watershed/hypertensive emergency: Also has left A2 segment occlusion, corresponding to the infarct along with severe stenosis of proximal cavernous segment of the left ICA.  Also has history of prior CVA.  Patient continues to have expressive aphasia.  Not following commands but moving all extremities spontaneously.  He has been started on aspirin and Plavix by neurology and neurology is following and managing this part.  PT OT SLP on board.  Allowing permissive hypertension, holding all antihypertensives, will place him on hydralazine as needed with  parameters.  Significantly elevated LDL 217 with cholesterol 291 but patient n.p.o. so holding statins for now.  Recent hemoglobin A1c done in January was 10.6.  Further management per neurology.  Type 2 diabetes mellitus: Patient appears to be taking glipizide as well as Lantus 15 units twice daily at home.  Currently only on SSI and significantly hyperglycemic in the range of 300s.  Will start him on 20 units of Lantus and continue SSI.  CAD s/p NSTEMI: Resume cardiac medications once able to take p.o.  History of polysubstance abuse: Cocaine positive in UDS.  Also uses EtOH.  GERD: Will resume PPI.  Schizoaffective disorder/PTSD: Will resume medications once he is able to tolerate p.o.  DVT prophylaxis: heparin injection 5,000 Units Start: 02/18/22 0600 SCD's Start: 02/17/22 2140   Code Status: Full Code  Family Communication:  None present at bedside.   Status is: Inpatient Remains inpatient appropriate because: Needs further neurological workup.   Estimated body mass index is 16.08 kg/m as calculated from the following:   Height as of this encounter: 5\' 6"  (1.676 m).   Weight as of this encounter: 45.2 kg.    Nutritional Assessment: Body mass index is 16.08 kg/m.[name] Seen by dietician.  I agree with the assessment and plan as outlined below: Nutrition Status:        . Skin Assessment: I have examined the patient's skin and I agree with the wound assessment as performed by the wound care RN as outlined below:    Consultants:  Neurology  Procedures:  None  Antimicrobials:  Anti-infectives (From admission, onward)    None  Subjective: Patient in and examined.  Alert but likely not oriented.  Answers " yes and fine" to all the questions asked.  Objective: Vitals:   02/18/22 0400 02/18/22 0525 02/18/22 0602 02/18/22 0828  BP: (!) 175/91   (!) 182/83  Pulse: 92   88  Resp: 16   18  Temp: 98.9 F (37.2 C)   98.6 F (37 C)  TempSrc: Oral   Oral   SpO2: 100%   100%  Weight:  45.2 kg    Height:   5\' 6"  (1.676 m)     Intake/Output Summary (Last 24 hours) at 02/18/2022 1056 Last data filed at 02/17/2022 1937 Gross per 24 hour  Intake 2000 ml  Output --  Net 2000 ml   Filed Weights   02/18/22 0525  Weight: 45.2 kg    Examination:  General exam: Appears calm and comfortable  Respiratory system: Clear to auscultation. Respiratory effort normal. Cardiovascular system: S1 & S2 heard, RRR. No JVD, murmurs, rubs, gallops or clicks. No pedal edema. Gastrointestinal system: Abdomen is nondistended, soft and nontender. No organomegaly or masses felt. Normal bowel sounds heard. Central nervous system: Alert but likely not oriented, not following commands. No focal neurological deficits. Extremities: Symmetric 5 x 5 power.  Data Reviewed: I have personally reviewed following labs and imaging studies  CBC: Recent Labs  Lab 02/17/22 1513 02/18/22 0652  WBC 6.5 6.7  NEUTROABS 5.0  --   HGB 14.6 15.2  HCT 47.1 49.1  MCV 89.4 89.9  PLT 235 563   Basic Metabolic Panel: Recent Labs  Lab 02/17/22 1513 02/18/22 0652  NA 141 144  K 4.1 4.0  CL 105 108  CO2 23 19*  GLUCOSE 343* 208*  BUN 29* 20  CREATININE 1.07 1.02  CALCIUM 8.7* 9.1   GFR: Estimated Creatinine Clearance: 44.3 mL/min (by C-G formula based on SCr of 1.02 mg/dL). Liver Function Tests: Recent Labs  Lab 02/17/22 1513 02/18/22 0652  AST 30 27  ALT 23 21  ALKPHOS 58 62  BILITOT 0.8 0.8  PROT 7.4 7.3  ALBUMIN 3.6 3.7   Recent Labs  Lab 02/17/22 2119  LIPASE 31   Recent Labs  Lab 02/17/22 1514  AMMONIA <10   Coagulation Profile: No results for input(s): "INR", "PROTIME" in the last 168 hours. Cardiac Enzymes: No results for input(s): "CKTOTAL", "CKMB", "CKMBINDEX", "TROPONINI" in the last 168 hours. BNP (last 3 results) No results for input(s): "PROBNP" in the last 8760 hours. HbA1C: No results for input(s): "HGBA1C" in the last 72  hours. CBG: Recent Labs  Lab 02/17/22 1517 02/18/22 0403 02/18/22 0928  GLUCAP 365* 364* 99   Lipid Profile: Recent Labs    02/18/22 0652  CHOL 291*  HDL 56  LDLCALC 217*  TRIG 88  CHOLHDL 5.2   Thyroid Function Tests: No results for input(s): "TSH", "T4TOTAL", "FREET4", "T3FREE", "THYROIDAB" in the last 72 hours. Anemia Panel: No results for input(s): "VITAMINB12", "FOLATE", "FERRITIN", "TIBC", "IRON", "RETICCTPCT" in the last 72 hours. Sepsis Labs: Recent Labs  Lab 02/17/22 1514 02/17/22 2119  LATICACIDVEN 2.0* 1.3    Recent Results (from the past 240 hour(s))  Resp panel by RT-PCR (RSV, Flu A&B, Covid) Anterior Nasal Swab     Status: None   Collection Time: 02/17/22  3:48 PM   Specimen: Anterior Nasal Swab  Result Value Ref Range Status   SARS Coronavirus 2 by RT PCR NEGATIVE NEGATIVE Final    Comment: (NOTE) SARS-CoV-2 target nucleic acids are NOT  DETECTED.  The SARS-CoV-2 RNA is generally detectable in upper respiratory specimens during the acute phase of infection. The lowest concentration of SARS-CoV-2 viral copies this assay can detect is 138 copies/mL. A negative result does not preclude SARS-Cov-2 infection and should not be used as the sole basis for treatment or other patient management decisions. A negative result may occur with  improper specimen collection/handling, submission of specimen other than nasopharyngeal swab, presence of viral mutation(s) within the areas targeted by this assay, and inadequate number of viral copies(<138 copies/mL). A negative result must be combined with clinical observations, patient history, and epidemiological information. The expected result is Negative.  Fact Sheet for Patients:  EntrepreneurPulse.com.au  Fact Sheet for Healthcare Providers:  IncredibleEmployment.be  This test is no t yet approved or cleared by the [place] FDA and  has been authorized for detection  and/or diagnosis of SARS-CoV-2 by FDA under an Emergency Use Authorization (EUA). This EUA will remain  in effect (meaning this test can be used) for the duration of the COVID-19 declaration under Section 564(b)(1) of the Act, 21 U.S.C.section 360bbb-3(b)(1), unless the authorization is terminated  or revoked sooner.       Influenza A by PCR NEGATIVE NEGATIVE Final   Influenza B by PCR NEGATIVE NEGATIVE Final    Comment: (NOTE) The Xpert Xpress SARS-CoV-2/FLU/RSV plus assay is intended as an aid in the diagnosis of influenza from Nasopharyngeal swab specimens and should not be used as a sole basis for treatment. Nasal washings and aspirates are unacceptable for Xpert Xpress SARS-CoV-2/FLU/RSV testing.  Fact Sheet for Patients: EntrepreneurPulse.com.au  Fact Sheet for Healthcare Providers: IncredibleEmployment.be  This test is not yet approved or cleared by the [place] FDA and has been authorized for detection and/or diagnosis of SARS-CoV-2 by FDA under an Emergency Use Authorization (EUA). This EUA will remain in effect (meaning this test can be used) for the duration of the COVID-19 declaration under Section 564(b)(1) of the Act, 21 U.S.C. section 360bbb-3(b)(1), unless the authorization is terminated or revoked.     Resp Syncytial Virus by PCR NEGATIVE NEGATIVE Final    Comment: (NOTE) Fact Sheet for Patients: EntrepreneurPulse.com.au  Fact Sheet for Healthcare Providers: IncredibleEmployment.be  This test is not yet approved or cleared by the [place] FDA and has been authorized for detection and/or diagnosis of SARS-CoV-2 by FDA under an Emergency Use Authorization (EUA). This EUA will remain in effect (meaning this test can be used) for the duration of the COVID-19 declaration under Section 564(b)(1) of the Act, 21 U.S.C. section 360bbb-3(b)(1), unless the authorization is terminated  or revoked.  Performed at [facility], [place] [address], [place], [place] [address]      Radiology Studies: CT Angio Head W or Wo Contrast  Result Date: 02/17/2022 CLINICAL DATA:  Stroke follow-up EXAM: CT ANGIOGRAPHY HEAD TECHNIQUE: Multidetector CT imaging of the head was performed using the standard protocol during bolus administration of intravenous contrast. Multiplanar CT image reconstructions and MIPs were obtained to evaluate the vascular anatomy. RADIATION DOSE REDUCTION: This exam was performed according to the departmental dose-optimization program which includes automated exposure control, adjustment of the mA and/or kV according to patient size and/or use of iterative reconstruction technique. CONTRAST:  6mL OMNIPAQUE IOHEXOL 350 MG/ML SOLN COMPARISON:  Brain MRI 02/17/2022 FINDINGS: POSTERIOR CIRCULATION: --Vertebral arteries: Normal --Inferior cerebellar arteries: Normal. --Basilar artery: Normal. --Superior cerebellar arteries: Normal. --Posterior cerebral arteries: Normal. ANTERIOR CIRCULATION: --Intracranial internal carotid arteries: Severe stenosis of the proximal cavernous segment  of the left ICA. Bilateral atherosclerotic calcification. --Anterior cerebral arteries (ACA): Left A2 segment occlusion, corresponding to the infarct seen on concomitant brain MRI. --Middle cerebral arteries (MCA): Normal. Venous sinuses: As permitted by contrast timing, patent. Anatomic variants: None Review of the MIP images confirms the above findings. IMPRESSION: 1. Left A2 segment occlusion, corresponding to the infarct seen on concomitant brain MRI. 2. Severe stenosis of the proximal cavernous segment of the left ICA. Electronically Signed   By: Deatra Robinson M.D.   On: 02/17/2022 20:46   MR BRAIN WO CONTRAST  Result Date: 02/17/2022 CLINICAL DATA:  Stroke suspected EXAM: MRI HEAD WITHOUT CONTRAST TECHNIQUE: Multiplanar, multiecho pulse sequences of the brain and surrounding  structures were obtained without intravenous contrast. COMPARISON:  07/10/2020 MRI head, correlation is made with CT head 02/17/2022 FINDINGS: Evaluation is somewhat limited by motion artifact. Brain: Restricted diffusion with ADC correlate in the medial left frontal lobe, primarily cortical and subcortical, extending from the anterior inferior left frontal lobe to the more superior and posterior left frontal lobe (series 5, images 15-27, 29, and 31), correlating with the left ACA territory and ACA-PCA watershed. These areas are associated with T2 hyperintense signal, likely cytotoxic edema. No acute hemorrhage, mass, mass effect, or midline shift. No hydrocephalus or extra-axial collection. Remote infarcts involving the bilateral occipital cortex, right parietal cortex, and posterior left frontal lobe white matter. Confluent T2 hyperintense signal in the periventricular white matter, likely the sequela of moderate to severe chronic small vessel ischemic disease. Vascular: Normal proximal arterial flow voids. Skull and upper cervical spine: Normal marrow signal. Sinuses/Orbits: Mucosal thickening in the posterior left ethmoid air cells. No acute finding in the orbits. Other: The mastoids are well aerated. IMPRESSION: 1. Acute to early subacute infarct in the medial left frontal lobe, correlating with the left ACA territory and ACA-PCA watershed. No evidence of hemorrhagic transformation or significant mass effect. 2. Remote infarcts involving the bilateral occipital cortex, right parietal cortex, and posterior left frontal lobe white matter. These results were called by telephone at the time of interpretation on 02/17/2022 at 7:02 pm to provider ZAMMIT , who verbally acknowledged these results. Electronically Signed   By: Wiliam Ke M.D.   On: 02/17/2022 19:03   CT Head Wo Contrast  Result Date: 02/17/2022 CLINICAL DATA:  Altered mental status, lethargy EXAM: CT HEAD WITHOUT CONTRAST TECHNIQUE: Contiguous axial  images were obtained from the base of the skull through the vertex without intravenous contrast. RADIATION DOSE REDUCTION: This exam was performed according to the departmental dose-optimization program which includes automated exposure control, adjustment of the mA and/or kV according to patient size and/or use of iterative reconstruction technique. COMPARISON:  CT brain, 07/09/2020 FINDINGS: Brain: Extensive, multifocal bilateral encephalomalacia, some of which appears to be new or at least increased compared to prior examination, for example in the medial right frontal lobe (series 2, image 18) and in the right parietal vertex (series 2, image 18). No evidence of hemorrhage, hydrocephalus, extra-axial collection or mass lesion/mass effect. Vascular: No hyperdense vessel or unexpected calcification. Skull: Normal. Negative for fracture or focal lesion. Sinuses/Orbits: No acute finding. Other: None. IMPRESSION: Extensive, multifocal bilateral encephalomalacia, some of which appears to be new or at least increased compared to prior examination dated 07/09/2020 although is generally age indeterminate. Recommend MR to further evaluate if there is clinical suspicion for acute diffusion restricting infarction. Electronically Signed   By: Jearld Lesch M.D.   On: 02/17/2022 16:21   CT ABDOMEN PELVIS W  CONTRAST  Result Date: 02/17/2022 CLINICAL DATA:  Abdominal pain.  Acute, nonlocalized. EXAM: CT ABDOMEN AND PELVIS WITH CONTRAST TECHNIQUE: Multidetector CT imaging of the abdomen and pelvis was performed using the standard protocol following bolus administration of intravenous contrast. RADIATION DOSE REDUCTION: This exam was performed according to the departmental dose-optimization program which includes automated exposure control, adjustment of the mA and/or kV according to patient size and/or use of iterative reconstruction technique. CONTRAST:  OMNIPAQUE IOHEXOL 300 MG/ML  SOLN COMPARISON:  None Available.  FINDINGS: Lower chest: Mild atelectasis in the RIGHT middle lobe. Otherwise lung bases clear. Hepatobiliary: No focal hepatic lesion. Normal gallbladder. No biliary duct dilatation. Common bile duct is normal. Pancreas: Pancreas normal Spleen: Normal spleen Adrenals/urinary tract: Adrenal glands normal. Bilateral nonobstructing renal calculi. No enhancing renal cortical lesion. Cortical scarring in the RIGHT kidney. Bladder unremarkable Stomach/Bowel: Stomach, small-bowel and cecum are normal. The appendix is not identified but there is no pericecal inflammation to suggest appendicitis. The colon and rectosigmoid colon are normal. Vascular/Lymphatic: Abdominal aorta is normal caliber with atherosclerotic calcification. There is no retroperitoneal or periportal lymphadenopathy. No pelvic lymphadenopathy. Reproductive: Prostate unremarkable Other: Very little intra-abdominal fat.  No free fluid or free air. Musculoskeletal: No aggressive osseous lesion. IMPRESSION: 1. No acute findings in the abdomen pelvis. 2. Bilateral nonobstructing renal calculi. 3. Very little intra-abdominal fat. 4.  Aortic Atherosclerosis (ICD10-I70.0). Electronically Signed   By: Genevive Bi M.D.   On: 02/17/2022 16:18   DG Chest Portable 1 View  Result Date: 02/17/2022 CLINICAL DATA:  Altered mental status EXAM: PORTABLE CHEST 1 VIEW COMPARISON:  CXR 01/23/22 FINDINGS: No pleural effusion. No pneumothorax. No focal airspace opacity. Normal cardiac and mediastinal contours. No radiographically apparent displaced rib fractures. Visualized upper abdomen is unremarkable. IMPRESSION: No active disease. Electronically Signed   By: Lorenza Cambridge M.D.   On: 02/17/2022 15:17    Scheduled Meds:  aspirin EC  81 mg Oral Daily   clopidogrel  75 mg Oral Daily   heparin  5,000 Units Subcutaneous Q8H   insulin aspart  0-9 Units Subcutaneous TID WC   insulin glargine-yfgn  20 Units Subcutaneous Daily   pantoprazole (PROTONIX) IV  40 mg  Intravenous Q24H   Continuous Infusions:  sodium chloride 75 mL/hr at 02/18/22 0527     LOS: 1 day   Hughie Closs, MD [facility]  02/18/2022, 10:56 AM   *Please note that this is a verbal dictation therefore any spelling or grammatical errors are due to the "Dragon Medical One" system interpretation.  Please page via Amion and do not message via secure chat for urgent patient care matters. Secure chat can be used for non urgent patient care matters.  How to contact the [facility] Attending or Consulting provider 7A - 7P or covering provider during after hours 7P -7A, for this patient?  Check the care team in [facility] and look for a) attending/consulting [facility] provider listed and b) the [facility] team listed. Page or secure chat 7A-7P. Log into [email or web address] and use [place] universal password to access. If you do not have the password, please contact the hospital operator. Locate the [facility] provider you are looking for under [facility] and page to a number that you can be directly reached. If you still have difficulty reaching the provider, please page the [facility] (Director on Call) for the Hospitalists listed on amion for assistance.

## 2022-02-18 NOTE — Plan of Care (Signed)
  Problem: Education: Goal: Knowledge of disease or condition will improve Outcome: Not Progressing Goal: Knowledge of secondary prevention will improve (MUST DOCUMENT ALL) Outcome: Not Progressing Goal: Knowledge of patient specific risk factors will improve (Mark N/A or DELETE if not current risk factor) Outcome: Not Progressing   Problem: Ischemic Stroke/TIA Tissue Perfusion: Goal: Complications of ischemic stroke/TIA will be minimized Outcome: Not Progressing   Problem: Coping: Goal: Will verbalize positive feelings about self Outcome: Not Progressing Goal: Will identify appropriate support needs Outcome: Not Progressing   Problem: Health Behavior/Discharge Planning: Goal: Ability to manage health-related needs will improve Outcome: Not Progressing Goal: Goals will be collaboratively established with patient/family Outcome: Not Progressing   Problem: Self-Care: Goal: Ability to participate in self-care as condition permits will improve Outcome: Not Progressing Goal: Verbalization of feelings and concerns over difficulty with self-care will improve Outcome: Not Progressing Goal: Ability to communicate needs accurately will improve Outcome: Not Progressing   Problem: Nutrition: Goal: Risk of aspiration will decrease Outcome: Not Progressing Goal: Dietary intake will improve Outcome: Not Progressing   Problem: Education: Goal: Ability to describe self-care measures that may prevent or decrease complications (Diabetes Survival Skills Education) will improve Outcome: Not Progressing Goal: Individualized Educational Video(s) Outcome: Not Progressing   Problem: Coping: Goal: Ability to adjust to condition or change in health will improve Outcome: Not Progressing   Problem: Fluid Volume: Goal: Ability to maintain a balanced intake and output will improve Outcome: Not Progressing   Problem: Health Behavior/Discharge Planning: Goal: Ability to identify and utilize  available resources and services will improve Outcome: Not Progressing Goal: Ability to manage health-related needs will improve Outcome: Not Progressing   Problem: Metabolic: Goal: Ability to maintain appropriate glucose levels will improve Outcome: Not Progressing   Problem: Nutritional: Goal: Maintenance of adequate nutrition will improve Outcome: Not Progressing Goal: Progress toward achieving an optimal weight will improve Outcome: Not Progressing   Problem: Skin Integrity: Goal: Risk for impaired skin integrity will decrease Outcome: Not Progressing   Problem: Tissue Perfusion: Goal: Adequacy of tissue perfusion will improve Outcome: Not Progressing   Problem: Education: Goal: Knowledge of General Education information will improve Description: Including pain rating scale, medication(s)/side effects and non-pharmacologic comfort measures Outcome: Not Progressing   Problem: Health Behavior/Discharge Planning: Goal: Ability to manage health-related needs will improve Outcome: Not Progressing   Problem: Clinical Measurements: Goal: Ability to maintain clinical measurements within normal limits will improve Outcome: Not Progressing Goal: Will remain free from infection Outcome: Not Progressing Goal: Diagnostic test results will improve Outcome: Not Progressing Goal: Respiratory complications will improve Outcome: Not Progressing Goal: Cardiovascular complication will be avoided Outcome: Not Progressing   Problem: Activity: Goal: Risk for activity intolerance will decrease Outcome: Not Progressing   Problem: Nutrition: Goal: Adequate nutrition will be maintained Outcome: Not Progressing   Problem: Coping: Goal: Level of anxiety will decrease Outcome: Not Progressing   Problem: Elimination: Goal: Will not experience complications related to bowel motility Outcome: Not Progressing Goal: Will not experience complications related to urinary retention Outcome:  Not Progressing   Problem: Pain Managment: Goal: General experience of comfort will improve Outcome: Not Progressing   Problem: Safety: Goal: Ability to remain free from injury will improve Outcome: Not Progressing   Problem: Skin Integrity: Goal: Risk for impaired skin integrity will decrease Outcome: Not Progressing   Problem: Safety: Goal: Non-violent Restraint(s) Outcome: Not Progressing   

## 2022-02-18 NOTE — Progress Notes (Addendum)
Hand off report received from [facility]. Awaiting for patient to be transported to the unit.

## 2022-02-18 NOTE — Progress Notes (Signed)
Patient just arrived to the floor as a new admission.

## 2022-02-18 NOTE — Progress Notes (Signed)
  Transition of Care [facility]) Screening Note   Patient Details  Name: William Acevedo Date of Birth: 1955-05-12   [facility] CM/SW Contact:    Geralynn Ochs, LCSW Phone Number: 02/18/2022, 3:52 PM    Transition of Care Department [facility]) has reviewed patient, medical workup in progress. Noting pending CIR consult. We will continue to monitor patient advancement through interdisciplinary progression rounds. If new patient transition needs arise, please place a TOC consult.

## 2022-02-18 NOTE — Progress Notes (Signed)
Inpatient Rehab Admissions Coordinator :  Per therapy recommendations, patient was screened for CIR candidacy by Cianna Kasparian RN MSN.  At this time patient appears to be a potential candidate for CIR. I will place a rehab consult per protocol for full assessment. Please call me with any questions.  Latashia Koch RN MSN Admissions Coordinator [phone]   

## 2022-02-18 NOTE — Progress Notes (Signed)
Tried placing patient's Farxiga in both applesauce and vanilla pudding to try and encourage him to take it, patient spat out medication. MD notified. Medications may need to be crushed in order to encourage him to take medication PO.

## 2022-02-18 NOTE — Progress Notes (Addendum)
STROKE TEAM PROGRESS NOTE   INTERVAL HISTORY No family at the bedside.   Presented yesterday as a Code Stroke d/t family reporting he had been lethargic, vomiting with decreased p.o. intake for 3 days and nonverbal. Previous admissions for strokes in the setting of ongoing cocaine use (March 2022, May 2022, in June 2022).   Patient somewhat abulic and non-cooperative with exam combined with aphasia.   Recommend Aspirin 81 mg daily and clopidogrel 75 mg daily for 3 weeks, then Plavix. Patient reportedly taking aspirin 81 at home, unknown compliance history.   Vitals:   02/18/22 0400 02/18/22 0525 02/18/22 0602 02/18/22 0828  BP: (!) 175/91   (!) 182/83  Pulse: 92   88  Resp: 16   18  Temp: 98.9 F (37.2 C)   98.6 F (37 C)  TempSrc: Oral   Oral  SpO2: 100%   100%  Weight:  45.2 kg    Height:   5\' 6"  (1.676 m)    CBC:  Recent Labs  Lab 02/17/22 1513 02/18/22 0652  WBC 6.5 6.7  NEUTROABS 5.0  --   HGB 14.6 15.2  HCT 47.1 49.1  MCV 89.4 89.9  PLT 235 081   Basic Metabolic Panel:  Recent Labs  Lab 02/17/22 1513 02/18/22 0652  NA 141 144  K 4.1 4.0  CL 105 108  CO2 23 19*  GLUCOSE 343* 208*  BUN 29* 20  CREATININE 1.07 1.02  CALCIUM 8.7* 9.1   Lipid Panel:  Recent Labs  Lab 02/18/22 0652  CHOL 291*  TRIG 88  HDL 56  CHOLHDL 5.2  VLDL 18  LDLCALC 217*   HgbA1c: No results for input(s): "HGBA1C" in the last 168 hours. Urine Drug Screen:  Recent Labs  Lab 02/17/22 1635  LABOPIA NONE DETECTED  COCAINSCRNUR POSITIVE*  LABBENZ NONE DETECTED  AMPHETMU NONE DETECTED  THCU NONE DETECTED  LABBARB NONE DETECTED    Alcohol Level No results for input(s): "ETH" in the last 168 hours.  IMAGING past 24 hours CT Angio Head W or Wo Contrast  Result Date: 02/17/2022 CLINICAL DATA:  Stroke follow-up EXAM: CT ANGIOGRAPHY HEAD TECHNIQUE: Multidetector CT imaging of the head was performed using the standard protocol during bolus administration of intravenous contrast.  Multiplanar CT image reconstructions and MIPs were obtained to evaluate the vascular anatomy. RADIATION DOSE REDUCTION: This exam was performed according to the departmental dose-optimization program which includes automated exposure control, adjustment of the mA and/or kV according to patient size and/or use of iterative reconstruction technique. CONTRAST:  26mL OMNIPAQUE IOHEXOL 350 MG/ML SOLN COMPARISON:  Brain MRI 02/17/2022 FINDINGS: POSTERIOR CIRCULATION: --Vertebral arteries: Normal --Inferior cerebellar arteries: Normal. --Basilar artery: Normal. --Superior cerebellar arteries: Normal. --Posterior cerebral arteries: Normal. ANTERIOR CIRCULATION: --Intracranial internal carotid arteries: Severe stenosis of the proximal cavernous segment of the left ICA. Bilateral atherosclerotic calcification. --Anterior cerebral arteries (ACA): Left A2 segment occlusion, corresponding to the infarct seen on concomitant brain MRI. --Middle cerebral arteries (MCA): Normal. Venous sinuses: As permitted by contrast timing, patent. Anatomic variants: None Review of the MIP images confirms the above findings. IMPRESSION: 1. Left A2 segment occlusion, corresponding to the infarct seen on concomitant brain MRI. 2. Severe stenosis of the proximal cavernous segment of the left ICA. Electronically Signed   By: Ulyses Jarred M.D.   On: 02/17/2022 20:46   MR BRAIN WO CONTRAST  Result Date: 02/17/2022 CLINICAL DATA:  Stroke suspected EXAM: MRI HEAD WITHOUT CONTRAST TECHNIQUE: Multiplanar, multiecho pulse sequences of the brain and  surrounding structures were obtained without intravenous contrast. COMPARISON:  07/10/2020 MRI head, correlation is made with CT head 02/17/2022 FINDINGS: Evaluation is somewhat limited by motion artifact. Brain: Restricted diffusion with ADC correlate in the medial left frontal lobe, primarily cortical and subcortical, extending from the anterior inferior left frontal lobe to the more superior and posterior  left frontal lobe (series 5, images 15-27, 29, and 31), correlating with the left ACA territory and ACA-PCA watershed. These areas are associated with T2 hyperintense signal, likely cytotoxic edema. No acute hemorrhage, mass, mass effect, or midline shift. No hydrocephalus or extra-axial collection. Remote infarcts involving the bilateral occipital cortex, right parietal cortex, and posterior left frontal lobe white matter. Confluent T2 hyperintense signal in the periventricular white matter, likely the sequela of moderate to severe chronic small vessel ischemic disease. Vascular: Normal proximal arterial flow voids. Skull and upper cervical spine: Normal marrow signal. Sinuses/Orbits: Mucosal thickening in the posterior left ethmoid air cells. No acute finding in the orbits. Other: The mastoids are well aerated. IMPRESSION: 1. Acute to early subacute infarct in the medial left frontal lobe, correlating with the left ACA territory and ACA-PCA watershed. No evidence of hemorrhagic transformation or significant mass effect. 2. Remote infarcts involving the bilateral occipital cortex, right parietal cortex, and posterior left frontal lobe white matter. These results were called by telephone at the time of interpretation on 02/17/2022 at 7:02 pm to provider ZAMMIT , who verbally acknowledged these results. Electronically Signed   By: Merilyn Baba M.D.   On: 02/17/2022 19:03   CT Head Wo Contrast  Result Date: 02/17/2022 CLINICAL DATA:  Altered mental status, lethargy EXAM: CT HEAD WITHOUT CONTRAST TECHNIQUE: Contiguous axial images were obtained from the base of the skull through the vertex without intravenous contrast. RADIATION DOSE REDUCTION: This exam was performed according to the departmental dose-optimization program which includes automated exposure control, adjustment of the mA and/or kV according to patient size and/or use of iterative reconstruction technique. COMPARISON:  CT brain, 07/09/2020 FINDINGS:  Brain: Extensive, multifocal bilateral encephalomalacia, some of which appears to be new or at least increased compared to prior examination, for example in the medial right frontal lobe (series 2, image 18) and in the right parietal vertex (series 2, image 18). No evidence of hemorrhage, hydrocephalus, extra-axial collection or mass lesion/mass effect. Vascular: No hyperdense vessel or unexpected calcification. Skull: Normal. Negative for fracture or focal lesion. Sinuses/Orbits: No acute finding. Other: None. IMPRESSION: Extensive, multifocal bilateral encephalomalacia, some of which appears to be new or at least increased compared to prior examination dated 07/09/2020 although is generally age indeterminate. Recommend MR to further evaluate if there is clinical suspicion for acute diffusion restricting infarction. Electronically Signed   By: Delanna Ahmadi M.D.   On: 02/17/2022 16:21   CT ABDOMEN PELVIS W CONTRAST  Result Date: 02/17/2022 CLINICAL DATA:  Abdominal pain.  Acute, nonlocalized. EXAM: CT ABDOMEN AND PELVIS WITH CONTRAST TECHNIQUE: Multidetector CT imaging of the abdomen and pelvis was performed using the standard protocol following bolus administration of intravenous contrast. RADIATION DOSE REDUCTION: This exam was performed according to the departmental dose-optimization program which includes automated exposure control, adjustment of the mA and/or kV according to patient size and/or use of iterative reconstruction technique. CONTRAST:  165mL OMNIPAQUE IOHEXOL 300 MG/ML  SOLN COMPARISON:  None Available. FINDINGS: Lower chest: Mild atelectasis in the RIGHT middle lobe. Otherwise lung bases clear. Hepatobiliary: No focal hepatic lesion. Normal gallbladder. No biliary duct dilatation. Common bile duct is normal. Pancreas:  Pancreas normal Spleen: Normal spleen Adrenals/urinary tract: Adrenal glands normal. Bilateral nonobstructing renal calculi. No enhancing renal cortical lesion. Cortical scarring  in the RIGHT kidney. Bladder unremarkable Stomach/Bowel: Stomach, small-bowel and cecum are normal. The appendix is not identified but there is no pericecal inflammation to suggest appendicitis. The colon and rectosigmoid colon are normal. Vascular/Lymphatic: Abdominal aorta is normal caliber with atherosclerotic calcification. There is no retroperitoneal or periportal lymphadenopathy. No pelvic lymphadenopathy. Reproductive: Prostate unremarkable Other: Very little intra-abdominal fat.  No free fluid or free air. Musculoskeletal: No aggressive osseous lesion. IMPRESSION: 1. No acute findings in the abdomen pelvis. 2. Bilateral nonobstructing renal calculi. 3. Very little intra-abdominal fat. 4.  Aortic Atherosclerosis (ICD10-I70.0). Electronically Signed   By: Suzy Bouchard M.D.   On: 02/17/2022 16:18   DG Chest Portable 1 View  Result Date: 02/17/2022 CLINICAL DATA:  Altered mental status EXAM: PORTABLE CHEST 1 VIEW COMPARISON:  CXR 01/23/22 FINDINGS: No pleural effusion. No pneumothorax. No focal airspace opacity. Normal cardiac and mediastinal contours. No radiographically apparent displaced rib fractures. Visualized upper abdomen is unremarkable. IMPRESSION: No active disease. Electronically Signed   By: Marin Roberts M.D.   On: 02/17/2022 15:17    PHYSICAL EXAM Constitutional: Appears cachetic middle-aged African-American male, bilateral wrist restraints in place  Psych: Affect appears labile, flat to frustrated HEENT: [place], AT MSK: no joint deformities.  Cardiovascular: Normal rate and regular rhythm. Perfusing extremities well Respiratory: Effort normal, non-labored breathing GI: Scaphoid. Firm.  Skin: Warm dry and intact visible skin   Neuro: Mental Status: Patient is awake, alert, tracks examiner, doesn't clearly follow commands but mimics. States, "Im old" when asked his age. Says "I'm fine" with repetition. Decreased attention. Abulia present.  Cranial Nerves: II: No clear blink to  threat but tracks examiner in all directions. PERRL. III,IV, VI: EOMI V: Facial sensation is symmetric to light touch VII: Facial movement is symmetric.  VIII: hearing is intact to voice Remainder Unable to assess secondary to patient's uncooperation/inability to follow commands Motor: Diffusely cachetic. Grossly equally strength, but not following commands.  Possible slight drift BLE, unsure if this is d/t motivation or weakness.  No drift BUE. Sensory: Report of decreased sensation RLE, but able to feel in R foot.  Cerebellar: Uncooperative vs unable to assess d/t mental status Gait:  Deferred  ASSESSMENT/PLAN William Acevedo is a 67 y.o. male with a past medical history significant for uncontrolled hypertension, hyperlipidemia, uncontrolled diabetes, poly substance abuse (cocaine, tobacco, alcohol), prior pulmonary TB, and prior c/f possible cryptococcal pneumonia (10/2020) Presented d/t family reporting he had been lethargic, vomiting with decreased intake for 3 days and nonverbal Multiple admissions for strokes in the setting of ongoing cocaine use (March 2022, May 2022, in June 2022), and was recently brought to the ED in early January as an IVC, cleared by psychiatry and plan for follow-up with the [place]. LKW: 3 days prior to admission Thrombolytic given?: No, out of the window IA performed?: No, out of the window  Acute Left ACA Ischemic Stroke Etiology:  atheroembolic vs cardioembolic vs secondary to cocaine-related vasculopathy Code Stroke: Extensive, multifocal bilateral encephalomalacia, some of which appears to be new or at least increased compared to prior examination dated 07/09/2020 although is generally age indeterminate  CTA head & neck: Left A2 segment occlusion, corresponding to the infarct seen on concomitant brain MRI. Severe stenosis of the proximal cavernous segment of the left ICA.  MRI: Acute to early subacute infarct in the medial left frontal lobe,  correlating with the left ACA territory and ACA-PCA watershed. No evidence of hemorrhagic transformation or significant mass effect. Remote infarcts involving the bilateral occipital cortex, right parietal cortex, and posterior left frontal lobe white matter.  2D Echo: LVEF 55-60%. Grade I diastolic dysfunction, elevated LV end-diastolic pressure.  Abnormal Mitral Valve, Trivial MVR, Severe Mitral annular calcification Tricuspid Aortic Valve, Moderate Calcification, Moderate Thickening  LDL 217 HgbA1c 10.6 VTE prophylaxis - SCDs    Diet   Diet NPO time specified   aspirin 81 mg daily prior to admission, now on aspirin 81 mg daily and clopidogrel 75 mg daily for 3 weeks, then Plavix. Patient reportedly taking aspirin 81 at home, unknown compliance history.  Therapy recommendations:  AIR Disposition:  pending  Hypertension Home meds:  norvasc, toprol-xl 50mg  Permissive hypertension (OK if < 220/120) but gradually normalize in 5-7 days Long-term BP goal normotensive  Hyperlipidemia Home meds: lipitor 40mg  LDL 217, goal < 70 Increased Lipitor to 80mg , ?medication compliance Continue statin at discharge  Diabetes type II Uncontrolled Home meds:  farxiga, glipizide, insulin HgbA1c 10.6, goal < 7.0 CBGs Recent Labs    02/17/22 1517 02/18/22 0403 02/18/22 0928  GLUCAP 365* 364* 99    SSI Recommend Diabetes educator   Other Stroke Risk Factors Advanced Age >/= 66  Cigarette smoker: advised to stop smoking ETOH use, alcohol level <10, advised to drink no more than 1-2 drink(s) a day Substance abuse - UDS: Cocaine POSITIVE. Patient advised to stop using due to stroke risk. Hx stroke/TIA  Other Active Problems ?Schizoaffective Disorder disorder Zyprexa, Trazadone at home  Hospital day # 1   Pt seen by Neuro NP/APP and later by MD. Note/plan to be edited by MD as needed.    04/19/22, DNP, AGACNP-BC [facility] Please use AMION for pager and EPIC for  messaging  STROKE MD NOTE :  I have personally obtained history,examined this patient, reviewed notes, independently viewed imaging studies, participated in medical decision making and plan of care.ROS completed by me personally and pertinent positives fully documented  I have made any additions or clarifications directly to the above note. Agree with note above.  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  Presents with new left ACA territory infarct with altered mental status and abulia but not much focal deficits.  Urine drug screen is again positive for cocaine.  Recommend dual antiplatelet therapy aspirin and Plavix for 3 weeks followed by Plavix alone and aggressive risk factor modification.  Patient counseled to quit cocaine and smoking.  Aggressive risk factor modification.  Long discussion with patient and with Dr. 04/19/22.  Greater than 50% time during this 50-minute visit was spent in counseling and coordination of care about his strokes and cocaine abuse and answering questions. 75, MD Medical Director [facility] Pager: [phone] 02/18/2022 5:09 PM   To contact Stroke Continuity provider, please refer to [facility]. After hours, contact General Neurology

## 2022-02-18 NOTE — Evaluation (Signed)
Occupational Therapy Evaluation Patient Details Name: William Acevedo MRN: [id number] DOB: 1956/01/15 Today's Date: 02/18/2022   History of Present Illness Pt is a 67 y.o. M who presents 02/17/2022 with increased lethargy, vomiting, decreased intake x 3 days. MRI with 1. Acute to early subacute infarct in the medial left frontal lobe,  correlating with the left ACA territory and ACA-PCA watershed. No  evidence of hemorrhagic transformation or significant mass effect. 2. Remote infarcts involving the bilateral occipital cortex, right  parietal cortex, and posterior left frontal lobe white matter. PMH includes: HTN, HLD, CVA, uncontrolled DM II, renal infarct, major depressive disorder, polysubstance abuse (cocaine and marijuana), multiple recent admissions for abdominal pain, shingles, and multifocal acute infarcts.   Clinical Impression   Pt presenting with impaired cognition, balance, safety, awareness, activity tolerance and communication. Pt with limited verbalizations, but was able to state name and date of birth correctly at beginning of session. Pt donning socks at bed level with set-up A. Ambulating to restroom with min A and staying on toilet 8+ minutes. After attempting to use restroom, standing at sink performing grooming tasks with min guard A, then lifting gown to urinate on floor. No overt overshooting or undershooting in session, but observed with frequent closing of eyes. Will continue to assess vision. Recommending AIR to optimize safety and independence in ADL and IADL.      Recommendations for follow up therapy are one component of a multi-disciplinary discharge planning process, led by the attending physician.  Recommendations may be updated based on patient status, additional functional criteria and insurance authorization.   Follow Up Recommendations  Acute inpatient rehab (3hours/day)     Assistance Recommended at Discharge Frequent or constant Supervision/Assistance   Patient can return home with the following A lot of help with walking and/or transfers;A little help with bathing/dressing/bathroom;Assistance with cooking/housework;Direct supervision/assist for medications management;Direct supervision/assist for financial management;Assist for transportation;Help with stairs or ramp for entrance    Functional Status Assessment  Patient has had a recent decline in their functional status and demonstrates the ability to make significant improvements in function in a reasonable and predictable amount of time.  Equipment Recommendations  Other (comment) (defer)    Recommendations for Other Services Rehab consult     Precautions / Restrictions Precautions Precautions: Fall;Other (comment) Precaution Comments: wrist restraints Restrictions Weight Bearing Restrictions: No      Mobility Bed Mobility Overal bed mobility: Needs Assistance Bed Mobility: Supine to Sit     Supine to sit: Supervision     General bed mobility comments: no physical assist required    Transfers Overall transfer level: Needs assistance Equipment used: 1 person hand held assist Transfers: Sit to/from Stand Sit to Stand: Min assist           General transfer comment: MinA to rise and steady      Balance Overall balance assessment: Needs assistance Sitting-balance support: Feet supported Sitting balance-Leahy Scale: Fair     Standing balance support: No upper extremity supported, During functional activity Standing balance-Leahy Scale: Poor Standing balance comment: min guard at sink for ADL's                           ADL either performed or assessed with clinical judgement   ADL Overall ADL's : Needs assistance/impaired Eating/Feeding: Supervision/ safety;Sitting   Grooming: Wash/dry hands;Wash/dry face;Min guard;Standing;Cueing for sequencing Grooming Details (indicate cue type and reason): cues to use soap. Also when initially asked to  wash  hands, placing wash cloth under soap dispenser and then wiping bottom again despite having just wiped at toilet Upper Body Bathing: Set up;Sitting   Lower Body Bathing: Minimal assistance;Sit to/from stand   Upper Body Dressing : Set up;Sitting   Lower Body Dressing: Set up;Bed level;Cueing for safety;Cueing for compensatory techniques Lower Body Dressing Details (indicate cue type and reason): to don socks Toilet Transfer: Minimal assistance;Ambulation Toilet Transfer Details (indicate cue type and reason): 1 person hand held assist Toileting- Clothing Manipulation and Hygiene: Min guard;Sit to/from stand;Cueing for sequencing Toileting - Clothing Manipulation Details (indicate cue type and reason): Pt cleaning self up at toilet. Walking to sink and perforing grooming tasks. Then lifting gown and urinating on floor standing by sink. Appeared to be behavioral in nature     Functional mobility during ADLs: Minimal assistance General ADL Comments: Weakness, decreased communication, cognition, as well as behavior limiting patient     Vision Patient Visual Report: Other (comment) (inconsistent report) Vision Assessment?: Vision impaired- to be further tested in functional context Additional Comments: Pt initially reporting double vision when up walking around bed, but when asked to participate in visual testing, reporting his vision was normal. No over/undershooting noted during ADL     Perception     Praxis      Pertinent Vitals/Pain Pain Assessment Faces Pain Scale: Hurts a little bit Pain Location: abdomen     Hand Dominance  (Pt reports R, but prior admission notes say L.)   Extremity/Trunk Assessment Upper Extremity Assessment Upper Extremity Assessment: Generalized weakness;RUE deficits/detail RUE Deficits / Details: slightly weaker than L grasp ~4-/5 as compared to L 4+/5   Lower Extremity Assessment Lower Extremity Assessment: Defer to PT evaluation RLE Deficits /  Details: ROM WFL LLE Deficits / Details: ROM WFL   Cervical / Trunk Assessment Cervical / Trunk Assessment: Normal   Communication Communication Communication: Expressive difficulties   Cognition Arousal/Alertness: Awake/alert Behavior During Therapy: Flat affect Overall Cognitive Status: Difficult to assess Area of Impairment: Orientation, Attention, Following commands, Memory, Safety/judgement, Problem solving, Awareness                 Orientation Level: Disoriented to, Place, Time, Situation Current Attention Level: Sustained Memory: Decreased short-term memory Following Commands: Follows one step commands consistently Safety/Judgement: Decreased awareness of safety, Decreased awareness of deficits Awareness: Intellectual Problem Solving: Requires verbal cues General Comments: Pt drowsy upon entry but able to arouse with min stimulation. Flat affect, one word responses, mainly "yeah," or "mhm." able to state name and birthday. Unable to provide other PLOF information     General Comments       Exercises     Shoulder Instructions      Home Living Family/patient expects to be discharged to:: Private residence Living Arrangements: Other relatives (brother) Available Help at Discharge: Family Type of Home: House Home Access: Ramped entrance     Home Layout: One level               [place]: BSC/3in1;Shower seat;Cane - single point;Crutches;Wheelchair - manual   Additional Comments: Information taken from 2022; unsure if still accurate. Recent IVC admission and cleared by psych      Prior Functioning/Environment Prior Level of Function : Independent/Modified Independent                        OT Problem List:        OT Treatment/Interventions: Self-care/ADL training;Therapeutic exercise;DME and/or AE instruction;Therapeutic activities;Balance  training;Patient/family education;Cognitive remediation/compensation;Visual/perceptual  remediation/compensation    OT Goals(Current goals can be found in the care plan section) Acute Rehab OT Goals Patient Stated Goal: none stated OT Goal Formulation: With patient Time For Goal Achievement: 03/04/22 Potential to Achieve Goals: Good  OT Frequency: Min 2X/week    Co-evaluation PT/OT/SLP Co-Evaluation/Treatment: Yes Reason for Co-Treatment: For patient/therapist safety;Necessary to address cognition/behavior during functional activity;To address functional/ADL transfers PT goals addressed during session: Mobility/safety with mobility OT goals addressed during session: ADL's and self-care      AM-PAC OT "6 Clicks" Daily Activity     Outcome Measure Help from another person eating meals?: None Help from another person taking care of personal grooming?: A Little Help from another person toileting, which includes using toliet, bedpan, or urinal?: A Little Help from another person bathing (including washing, rinsing, drying)?: A Little Help from another person to put on and taking off regular upper body clothing?: A Little Help from another person to put on and taking off regular lower body clothing?: A Little 6 Click Score: 19   End of Session Equipment Utilized During Treatment: Gait belt Nurse Communication: Mobility status  Activity Tolerance: Patient tolerated treatment well Patient left: in bed;with call bell/phone within reach;with bed alarm set;with restraints reapplied;with nursing/sitter in room (NT checking vitals)  OT Visit Diagnosis: Unsteadiness on feet (R26.81);Other abnormalities of gait and mobility (R26.89);Muscle weakness (generalized) (M62.81);Low vision, both eyes (H54.2)                Time: 1137-1209 OT Time Calculation (min): 32 min Charges:  OT General Charges $OT Visit: 1 Visit OT Evaluation $OT Eval Moderate Complexity: 1 Mod OT Treatments $Self Care/Home Management : 8-22 mins  Elder Cyphers, OTR/L [facility] Acute Rehabilitation Office:  [phone]    Magnus Ivan 02/18/2022, 12:55 PM

## 2022-02-18 NOTE — Progress Notes (Signed)
  Echocardiogram 2D Echocardiogram has been performed.  William Acevedo 02/18/2022, 3:13 PM

## 2022-02-18 NOTE — Plan of Care (Signed)
  Problem: Education: Goal: Knowledge of disease or condition will improve Outcome: Not Progressing Goal: Knowledge of secondary prevention will improve (MUST DOCUMENT ALL) Outcome: Not Progressing Goal: Knowledge of patient specific risk factors will improve Elta [place] N/A or DELETE if not current risk factor) Outcome: Not Progressing   Problem: Ischemic Stroke/TIA Tissue Perfusion: Goal: Complications of ischemic stroke/TIA will be minimized Outcome: Not Progressing   Problem: Coping: Goal: Will verbalize positive feelings about self Outcome: Not Progressing Goal: Will identify appropriate support needs Outcome: Not Progressing   Problem: Health Behavior/Discharge Planning: Goal: Ability to manage health-related needs will improve Outcome: Not Progressing Goal: Goals will be collaboratively established with patient/family Outcome: Not Progressing   Problem: Self-Care: Goal: Ability to participate in self-care as condition permits will improve Outcome: Not Progressing Goal: Verbalization of feelings and concerns over difficulty with self-care will improve Outcome: Not Progressing Goal: Ability to communicate needs accurately will improve Outcome: Not Progressing   Problem: Nutrition: Goal: Risk of aspiration will decrease Outcome: Not Progressing Goal: Dietary intake will improve Outcome: Not Progressing   Problem: Education: Goal: Ability to describe self-care measures that may prevent or decrease complications (Diabetes Survival Skills Education) will improve Outcome: Not Progressing Goal: Individualized Educational Video(s) Outcome: Not Progressing   Problem: Coping: Goal: Ability to adjust to condition or change in health will improve Outcome: Not Progressing   Problem: Fluid Volume: Goal: Ability to maintain a balanced intake and output will improve Outcome: Not Progressing   Problem: Health Behavior/Discharge Planning: Goal: Ability to identify and utilize  available resources and services will improve Outcome: Not Progressing Goal: Ability to manage health-related needs will improve Outcome: Not Progressing   Problem: Metabolic: Goal: Ability to maintain appropriate glucose levels will improve Outcome: Not Progressing   Problem: Nutritional: Goal: Maintenance of adequate nutrition will improve Outcome: Not Progressing Goal: Progress toward achieving an optimal weight will improve Outcome: Not Progressing   Problem: Skin Integrity: Goal: Risk for impaired skin integrity will decrease Outcome: Not Progressing   Problem: Tissue Perfusion: Goal: Adequacy of tissue perfusion will improve Outcome: Not Progressing   Problem: Education: Goal: Knowledge of General Education information will improve Description: Including pain rating scale, medication(s)/side effects and non-pharmacologic comfort measures Outcome: Not Progressing   Problem: Health Behavior/Discharge Planning: Goal: Ability to manage health-related needs will improve Outcome: Not Progressing   Problem: Clinical Measurements: Goal: Ability to maintain clinical measurements within normal limits will improve Outcome: Not Progressing Goal: Will remain free from infection Outcome: Not Progressing Goal: Diagnostic test results will improve Outcome: Not Progressing Goal: Respiratory complications will improve Outcome: Not Progressing Goal: Cardiovascular complication will be avoided Outcome: Not Progressing   Problem: Activity: Goal: Risk for activity intolerance will decrease Outcome: Not Progressing   Problem: Nutrition: Goal: Adequate nutrition will be maintained Outcome: Not Progressing   Problem: Coping: Goal: Level of anxiety will decrease Outcome: Not Progressing   Problem: Elimination: Goal: Will not experience complications related to bowel motility Outcome: Not Progressing Goal: Will not experience complications related to urinary retention Outcome:  Not Progressing   Problem: Pain Managment: Goal: General experience of comfort will improve Outcome: Not Progressing   Problem: Safety: Goal: Ability to remain free from injury will improve Outcome: Not Progressing   Problem: Skin Integrity: Goal: Risk for impaired skin integrity will decrease Outcome: Not Progressing   Problem: Safety: Goal: Non-violent Restraint(s) Outcome: Not Progressing
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
## 2022-02-18 NOTE — Progress Notes (Signed)
Unable to complete admission screenings, patient did not respond verbally.   

## 2022-02-18 NOTE — Evaluation (Signed)
Speech Language Pathology Evaluation Patient Details Name: DYWANE PERUSKI MRN: [id number] DOB: Jun 05, 1955 Today's Date: 02/18/2022 Time: 9379-0240 SLP Time Calculation (min) (ACUTE ONLY): 18 min  Problem List:  Patient Active Problem List   Diagnosis Date Noted   Schizoaffective disorder ([place]) 11/10/2021   Alcohol use, unspecified with intoxication, unspecified ([place]) 11/10/2021   Cocaine abuse with intoxication, unspecified ([place]) 11/10/2021   Antisocial personality disorder ([place]) 11/10/2021   Cannabis dependence, continuous abuse ([place]) 11/10/2021   Opioid abuse ([place]) 11/10/2021   Post-traumatic stress disorder, chronic 11/10/2021   Schizophrenia, unspecified ([place]) 11/10/2021   CVA (cerebral vascular accident) ([place]) 07/09/2020   Acute ischemic multifocal multiple vascular territories stroke ([place]) 06/06/2020   Seizure-like activity ([place]) 06/06/2020   Cocaine abuse ([place])    ARF (acute renal failure) ([place]) 04/14/2020   Acute blood loss anemia 04/14/2020   GI bleed 04/14/2020   Nausea & vomiting 97/35/3299   Acute metabolic encephalopathy 24/26/8341   Hypertensive urgency 04/14/2020   Abdominal pain 04/02/2020   Protein-calorie malnutrition, severe 03/27/2020   Cerebral thrombosis with cerebral infarction 03/26/2020   NSTEMI (non-ST elevated myocardial infarction) ([place]) 03/25/2020   Secondary diabetes mellitus with HHNC (hyperglycemia hyperosmolar non-ketotic coma) ([place]) 03/25/2020   Elevated troponin    Renal infarction [facility])    Cerebrovascular accident (CVA) ([place])    Cocaine use disorder, severe, dependence ([place]) 04/08/2019   Major depressive disorder 04/08/2019   Epiglottitis 04/07/2019   Cocaine use 04/07/2019   Acute encephalopathy 04/07/2019   Overdose 07/06/2015   AKI (acute kidney injury) ([place]) 07/06/2015   Polysubstance abuse ([place]) 07/06/2015   DM2 (diabetes mellitus, type 2) ([place]) 07/06/2015   Dehydration    MDD (major depressive disorder), recurrent episode,  mild ([facility])    Alcohol use disorder    Past Medical History:  Past Medical History:  Diagnosis Date   Carpal tunnel syndrome    Depression    Diabetes mellitus without complication ([place])    Hypertensive urgency 04/14/2020   Neuropathy    Past Surgical History:  Past Surgical History:  Procedure Laterality Date   BUBBLE STUDY  03/30/2020   Procedure: BUBBLE STUDY;  Surgeon: Jerline Pain, MD;  Location: [place];  Service: Cardiovascular;;   TEE WITHOUT CARDIOVERSION N/A 03/30/2020   Procedure: TRANSESOPHAGEAL ECHOCARDIOGRAM (TEE);  Surgeon: Jerline Pain, MD;  Location: [facility] ENDOSCOPY;  Service: Cardiovascular;  Laterality: N/A;   TESTICLE TORSION REDUCTION     HPI:  YVAN DORITY is a 67 y.o. male who presented to ED with increase lethargy, vomiting decreased intake x 3 days, as well as being nonverbal which is not patient baseline. MRI 2/1: "1. Acute to early subacute infarct in the medial left frontal lobe, correlating with the left ACA territory and ACA-PCA watershed. No evidence of hemorrhagic transformation or significant mass effect.  2. Remote infarcts involving the bilateral occipital cortex, right parietal cortex, and posterior left frontal lobe white matter."  Pt with medical history significant of CAD s/p NSTEMI, hyperlipedemia, DMII insulin dependent, cerebral thrombosis with CVA, Essential hypertension, hx of polysubstance abuse, hx of endocarditis, hx of pulmonary cryptococosis, GERD, etoh abuse, Schizoaffectic disorder, PTSD.  Pt with significant baseline cognitive linguistic deficits documented during prior admission   Assessment / Plan / Recommendation Clinical Impression  Pt presents with severe cognitive linguistic deficits.  Pt with known significant deficits at baseline with score of 7 of 30 on SLUMS given 07/13/2020.  Today pt did not engage with attempts at assessment by readministration of SLUMS  to document changes, effectively scoring 0.  Pt responded "I don't  know" "Mm-hmm," "yeah" or "no" to questions without further elaboration.  Pt did not benefit from verbal, visual, or tactile cuing and did not repsond to encouragement.    Pt's communication was assessed informally.  Pt answered personal yes/no questions wiht 100% accuracy, but did not respond to questions about immediate environment or comparisons, bringing overall score to 40%.  Pt followed 1 step commands with 20% accuracy.  Pt completed repetition task with 80% accuracy, missing only longer sentence repetition to which he did not respond. Pt completed confrontational naming task with 50% accuracy, improving to 90% accuracy with semantic and phonemic cuing.  Spontaneous language output was very minimal.  Picture description task was not attempted as pt kept eyes closed for majority of session and was not engaging with assessment.  Speech was intelligible at word and phrase level in known context.  There was very little spontaneous speech output, but during repetition task, pt's speech was clear and without dysarthria.  Repetition was requested x1, but this was suspected to be 2/2 decreased effort rather than dysarthria as pt was able to speak clearly following request.   Pt is not a good candidate for speech therapy at this time given his low level of participation. Should pt's level of engagement improve, please re-consult ST for reassessment.    SLP Assessment  SLP Recommendation/Assessment: All further Speech Lanaguage Pathology  needs can be addressed in the next venue of care SLP Visit Diagnosis: Cognitive communication deficit (R41.841)    Recommendations for follow up therapy are one component of a multi-disciplinary discharge planning process, led by the attending physician.  Recommendations may be updated based on patient status, additional functional criteria and insurance authorization.    Follow Up Recommendations  Long-term institutional care without follow-up therapy    Assistance  Recommended at Discharge  Frequent or constant Supervision/Assistance  Functional Status Assessment Patient has had a recent decline in their functional status and/or demonstrates limited ability to make significant improvements in function in a reasonable and predictable amount of time  Frequency and Duration           SLP Evaluation Cognition  Overall Cognitive Status: Difficult to assess Orientation Level: Disoriented X4 (patient just says "NO" to every question)       Comprehension  Auditory Comprehension Yes/No Questions: Impaired Basic Biographical Questions: 76-100% accurate Basic Immediate Environment Questions: 0-24% accurate Complex Questions: 0-24% accurate Commands: Impaired One Step Basic Commands: 0-24% accurate Visual Recognition/Discrimination Discrimination: Not tested Reading Comprehension Reading Status: Not tested    Expression Expression Primary Mode of Expression: Verbal Verbal Expression Overall Verbal Expression: Impaired Initiation: Impaired Repetition: Impaired Level of Impairment: Sentence level Naming: Impairment Confrontation: Impaired Effective Techniques: Phonemic cues Written Expression Written Expression: Not tested   Oral / Motor  Motor Speech Respiration: Within functional limits Phonation: Normal Resonance: Within functional limits Articulation: Impaired Level of Impairment: Word Motor Speech Errors: Not applicable            Celedonio Savage, MA, [place] Office: [phone] 02/18/2022, 11:30 AM

## 2022-02-18 NOTE — Plan of Care (Signed)
  Problem: Education: Goal: Knowledge of disease or condition will improve Outcome: Not Progressing Goal: Knowledge of secondary prevention will improve (MUST DOCUMENT ALL) Outcome: Not Progressing Goal: Knowledge of patient specific risk factors will improve (Mark N/A or DELETE if not current risk factor) Outcome: Not Progressing   Problem: Ischemic Stroke/TIA Tissue Perfusion: Goal: Complications of ischemic stroke/TIA will be minimized Outcome: Not Progressing   Problem: Coping: Goal: Will verbalize positive feelings about self Outcome: Not Progressing Goal: Will identify appropriate support needs Outcome: Not Progressing   Problem: Health Behavior/Discharge Planning: Goal: Ability to manage health-related needs will improve Outcome: Not Progressing Goal: Goals will be collaboratively established with patient/family Outcome: Not Progressing   Problem: Self-Care: Goal: Ability to participate in self-care as condition permits will improve Outcome: Not Progressing Goal: Verbalization of feelings and concerns over difficulty with self-care will improve Outcome: Not Progressing Goal: Ability to communicate needs accurately will improve Outcome: Not Progressing   Problem: Nutrition: Goal: Risk of aspiration will decrease Outcome: Not Progressing Goal: Dietary intake will improve Outcome: Not Progressing   Problem: Education: Goal: Ability to describe self-care measures that may prevent or decrease complications (Diabetes Survival Skills Education) will improve Outcome: Not Progressing Goal: Individualized Educational Video(s) Outcome: Not Progressing   Problem: Coping: Goal: Ability to adjust to condition or change in health will improve Outcome: Not Progressing   Problem: Fluid Volume: Goal: Ability to maintain a balanced intake and output will improve Outcome: Not Progressing   Problem: Health Behavior/Discharge Planning: Goal: Ability to identify and utilize  available resources and services will improve Outcome: Not Progressing Goal: Ability to manage health-related needs will improve Outcome: Not Progressing   Problem: Metabolic: Goal: Ability to maintain appropriate glucose levels will improve Outcome: Not Progressing   Problem: Nutritional: Goal: Maintenance of adequate nutrition will improve Outcome: Not Progressing Goal: Progress toward achieving an optimal weight will improve Outcome: Not Progressing   Problem: Skin Integrity: Goal: Risk for impaired skin integrity will decrease Outcome: Not Progressing   Problem: Tissue Perfusion: Goal: Adequacy of tissue perfusion will improve Outcome: Not Progressing   Problem: Education: Goal: Knowledge of General Education information will improve Description: Including pain rating scale, medication(s)/side effects and non-pharmacologic comfort measures Outcome: Not Progressing   Problem: Health Behavior/Discharge Planning: Goal: Ability to manage health-related needs will improve Outcome: Not Progressing   Problem: Clinical Measurements: Goal: Ability to maintain clinical measurements within normal limits will improve Outcome: Not Progressing Goal: Will remain free from infection Outcome: Not Progressing Goal: Diagnostic test results will improve Outcome: Not Progressing Goal: Respiratory complications will improve Outcome: Not Progressing Goal: Cardiovascular complication will be avoided Outcome: Not Progressing   Problem: Activity: Goal: Risk for activity intolerance will decrease Outcome: Not Progressing   Problem: Nutrition: Goal: Adequate nutrition will be maintained Outcome: Not Progressing   Problem: Coping: Goal: Level of anxiety will decrease Outcome: Not Progressing   Problem: Elimination: Goal: Will not experience complications related to bowel motility Outcome: Not Progressing Goal: Will not experience complications related to urinary retention Outcome:  Not Progressing   Problem: Pain Managment: Goal: General experience of comfort will improve Outcome: Not Progressing   Problem: Safety: Goal: Ability to remain free from injury will improve Outcome: Not Progressing   Problem: Skin Integrity: Goal: Risk for impaired skin integrity will decrease Outcome: Not Progressing   

## 2022-02-18 NOTE — Evaluation (Signed)
Physical Therapy Evaluation Patient Details Name: William Acevedo MRN: [id number] DOB: Feb 03, 1955 Today's Date: 02/18/2022  History of Present Illness  Pt is a 67 y.o. M who presents 02/17/2022 with increased lethargy, vomiting, decreased intake x 3 days. MRI with 1. Acute to early subacute infarct in the medial left frontal lobe,  correlating with the left ACA territory and ACA-PCA watershed. No  evidence of hemorrhagic transformation or significant mass effect. 2. Remote infarcts involving the bilateral occipital cortex, right  parietal cortex, and posterior left frontal lobe white matter. PMH includes: HTN, HLD, CVA, uncontrolled DM II, renal infarct, major depressive disorder, polysubstance abuse (cocaine and marijuana), multiple recent admissions for abdominal pain, shingles, and multifocal acute infarcts.  Clinical Impression  Pt admitted with above. Pt presents with impaired cognition, balance, activity tolerance, communication. Pt able to state name and birthday and follow one step commands. Pt ambulating room distances with min assist (+2 safety) and cueing for environmental navigation. After assisting to bathroom, pt standing at sink washing hands and then subsequently urinating on floor. Able to complete ADL's I.e. peri care and washing hands with cueing for initiation. Will need further assessment of vision. Recommend AIR to address deficits and maximize functional mobility.      Recommendations for follow up therapy are one component of a multi-disciplinary discharge planning process, led by the attending physician.  Recommendations may be updated based on patient status, additional functional criteria and insurance authorization.  Follow Up Recommendations Acute inpatient rehab (3hours/day)      Assistance Recommended at Discharge Frequent or constant Supervision/Assistance  Patient can return home with the following  A little help with walking and/or transfers;A little help with  bathing/dressing/bathroom;Assistance with cooking/housework;Assist for transportation;Help with stairs or ramp for entrance;Direct supervision/assist for medications management;Direct supervision/assist for financial management    Equipment Recommendations Other (comment) (TBD)  Recommendations for Other Services  Rehab consult    Functional Status Assessment Patient has had a recent decline in their functional status and demonstrates the ability to make significant improvements in function in a reasonable and predictable amount of time.     Precautions / Restrictions Precautions Precautions: Fall;Other (comment) Precaution Comments: wrist restraints Restrictions Weight Bearing Restrictions: No      Mobility  Bed Mobility Overal bed mobility: Needs Assistance Bed Mobility: Supine to Sit     Supine to sit: Supervision     General bed mobility comments: no physical assist required    Transfers Overall transfer level: Needs assistance Equipment used: 1 person hand held assist Transfers: Sit to/from Stand Sit to Stand: Min assist           General transfer comment: MinA to rise and steady    Ambulation/Gait Ambulation/Gait assistance: Min assist, +2 safety/equipment Gait Distance (Feet): 25 Feet Assistive device: 1 person hand held assist Gait Pattern/deviations: Step-through pattern, Decreased stride length, Narrow base of support Gait velocity: decreased     General Gait Details: Increased drift, requiring minA for balance. Utilizing narrow BOS. Verbal cues for environmental navigation  Stairs            Wheelchair Mobility    Modified Rankin (Stroke Patients Only) Modified Rankin (Stroke Patients Only) Pre-Morbid Rankin Score: No symptoms Modified Rankin: Moderately severe disability     Balance Overall balance assessment: Needs assistance Sitting-balance support: Feet supported Sitting balance-Leahy Scale: Fair     Standing balance support:  No upper extremity supported, During functional activity Standing balance-Leahy Scale: Poor Standing balance comment: min guard at sink  for ADL's                             Pertinent Vitals/Pain Pain Assessment Pain Assessment: Faces Faces Pain Scale: Hurts a little bit Pain Location: abdomen    Home Living Family/patient expects to be discharged to:: Private residence Living Arrangements: Other relatives (brother) Available Help at Discharge: Family Type of Home: House Home Access: Ramped entrance       Home Layout: One level Home Equipment: BSC/3in1;Shower seat;Cane - single point;Crutches;Wheelchair - manual Additional Comments: Information taken from 2022; unsure if still accurate. Recent IVC admission and cleared by psych    Prior Function Prior Level of Function : Independent/Modified Independent                     Hand Dominance        Extremity/Trunk Assessment   Upper Extremity Assessment Upper Extremity Assessment: Defer to OT evaluation    Lower Extremity Assessment Lower Extremity Assessment: Difficult to assess due to impaired cognition;RLE deficits/detail;LLE deficits/detail RLE Deficits / Details: ROM WFL LLE Deficits / Details: ROM WFL    Cervical / Trunk Assessment Cervical / Trunk Assessment: Normal  Communication   Communication: Expressive difficulties  Cognition Arousal/Alertness: Awake/alert Behavior During Therapy: Flat affect Overall Cognitive Status: Difficult to assess Area of Impairment: Orientation, Attention, Following commands, Memory, Safety/judgement, Problem solving, Awareness                 Orientation Level: Disoriented to, Place, Time, Situation Current Attention Level: Sustained Memory: Decreased short-term memory Following Commands: Follows one step commands consistently Safety/Judgement: Decreased awareness of safety, Decreased awareness of deficits Awareness: Intellectual Problem Solving:  Requires verbal cues General Comments: Pt drowsy upon entry but able to arouse with min stimulation. Flat affect, one word responses, mainly "yeah," or "mhm." able to state name and birthday. Unable to provide other PLOF information        General Comments      Exercises     Assessment/Plan    PT Assessment Patient needs continued PT services  PT Problem List Decreased strength;Decreased activity tolerance;Decreased balance;Decreased mobility;Decreased cognition;Decreased safety awareness       PT Treatment Interventions DME instruction;Gait training;Stair training;Functional mobility training;Therapeutic activities;Therapeutic exercise;Balance training;Patient/family education    PT Goals (Current goals can be found in the Care Plan section)  Acute Rehab PT Goals Patient Stated Goal: unable PT Goal Formulation: With patient Time For Goal Achievement: 03/04/22 Potential to Achieve Goals: Good    Frequency Min 3X/week     Co-evaluation PT/OT/SLP Co-Evaluation/Treatment: Yes Reason for Co-Treatment: For patient/therapist safety;To address functional/ADL transfers;Necessary to address cognition/behavior during functional activity PT goals addressed during session: Mobility/safety with mobility         AM-PAC PT "6 Clicks" Mobility  Outcome Measure Help needed turning from your back to your side while in a flat bed without using bedrails?: A Little Help needed moving from lying on your back to sitting on the side of a flat bed without using bedrails?: A Little Help needed moving to and from a bed to a chair (including a wheelchair)?: A Little Help needed standing up from a chair using your arms (e.g., wheelchair or bedside chair)?: A Little Help needed to walk in hospital room?: A Little Help needed climbing 3-5 steps with a railing? : A Lot 6 Click Score: 17    End of Session Equipment Utilized During Treatment: Gait belt Activity Tolerance:  Patient tolerated treatment  well Patient left: in bed;with call bell/phone within reach;with bed alarm set;with restraints reapplied Nurse Communication: Mobility status PT Visit Diagnosis: Unsteadiness on feet (R26.81);Difficulty in walking, not elsewhere classified (R26.2)    Time: 4098-1191 PT Time Calculation (min) (ACUTE ONLY): 31 min   Charges:   PT Evaluation $PT Eval Moderate Complexity: 1 Mod          Wyona Almas, PT, DPT Acute Rehabilitation Services Office [phone]   Deno Etienne 02/18/2022, 12:27 PM

## 2022-02-18 NOTE — Consult Note (Signed)
Neurology Consultation Reason for Consult: New stroke on MRI  Requesting Physician: Myles Rosenthal  CC: Aphasia  History is obtained from: Primarily chart review  HPI: William Acevedo is a 67 y.o. male with a past medical history significant for uncontrolled hypertension, hyperlipidemia, uncontrolled diabetes, poly substance abuse (cocaine, tobacco, alcohol), prior pulmonary TB, and prior c/f possible cryptococcal pneumonia (10/2020)  He presented due to family reporting he had been lethargic, vomiting with decreased p.o. intake for 3 days and nonverbal  He has had multiple admissions for strokes in the setting of ongoing cocaine use (March 2022, May 2022, in June 2022), and was recently brought to the ED in early January as an IVC, cleared by psychiatry and plan for follow-up with the [facility]  In October 2022 he was brought by family to [facility] in [place] for second opinion on his subacute deconditioning and weakness where he was found to have a new cavitary lung lesion. It was recommended he take fluconazole for 6 months to year for possible cryptococcal pneumonia, (time course to be determined on ID follow-up in [place]); unclear if this was completed given his ongoing substance use issues (full details below)  "Mr. Hornbaker was found to have a new necrotic PNA vs neoplastic lesion in his right middle lobe that did not previously appear on prior imaging he had in May 2022. He was started on cefepime and vancomycin. ID was consulted and an extensive infectious disease workup was started. 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 Interestingly, both of his cryptococcal serum antigen tests were negative. I have included our infectious disease team's assessment of this below. It is important to note that his clinical picture and culture support infectious from Cryptococcus, but particular attention should be  paid to ensure he recovers with fluconazole treatment, because an underlying neoplastic process cannot be ruled out at this time. ... Additionally, there had been reports of a mitral valve vegetation. Both a TTE and TEE were performed and cardiology's assessment was that it is small, poorly defined mass and could very well be healed endocarditis. It is unlikely that this mass represents a risk from an infectious standpoint."  ID note excerpt: "Patient presented with weight loss and chest imaging demonstrating a large consolidation in the RML with gas and air fluid level. He underwent BAL and transbronchial biopsy on 10/31. 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 This patient's presentation with subacute decline over several months, including weight loss, weakness, and failure to thrive, with outside radiographic findings of pleural based nodules in May 2022 progressing to cavitary lesion, and culture data as above may be consistent with cryptococcal pneumonia. However, serum cryptococcal antigen was negative x2 and respiratory cultures on 10/29 demonstrated moderate growth yeast, not cryptococcus neoformans/gattii. Therefore while his clinical presentation is consistent with cryptococcal pneumonia, his laboratory and culture data is not unequivocally supportive of this diagnosis. "  He has had a weight loss of 55.8 kg to 45.2 kg since January (was 47.7 kg at discharge from [facility] 11/30/2020)  LKW: 3 days prior to admission Thrombolytic given?: No, out of the window IA performed?: No, out of the window  ROS: Unable to obtain due to altered mental status.   Past Medical History:  Diagnosis Date   Carpal tunnel syndrome    Depression    Diabetes mellitus without complication ([place])    Hypertensive urgency 04/14/2020   Neuropathy  Past Surgical History:  Procedure Laterality Date   BUBBLE STUDY  03/30/2020   Procedure: BUBBLE STUDY;  Surgeon: Jerline Pain, MD;   Location: [place];  Service: Cardiovascular;;   TEE WITHOUT CARDIOVERSION N/A 03/30/2020   Procedure: TRANSESOPHAGEAL ECHOCARDIOGRAM (TEE);  Surgeon: Jerline Pain, MD;  Location: [facility] ENDOSCOPY;  Service: Cardiovascular;  Laterality: N/A;   TESTICLE TORSION REDUCTION     Current Outpatient Medications  Medication Instructions   ACCU-CHEK GUIDE test strip No dose, route, or frequency recorded.   albuterol (PROVENTIL) (2.5 MG/3ML) 0.083% nebulizer solution 3 mLs, Every 6 hours PRN   amLODipine (NORVASC) 10 mg, Oral, Daily   aspirin EC 81 mg, Oral, Daily, Swallow whole.   atorvastatin (LIPITOR) 40 mg, Oral, Daily at bedtime   Belsomra 10 mg, Oral, Daily at bedtime   blood glucose meter kit and supplies KIT Dispense based on patient and insurance preference. Use up to four times daily as directed. (FOR ICD-9 250.00, 250.01).   clopidogrel (PLAVIX) 75 mg, Oral, Daily   DULoxetine (CYMBALTA) 60 mg, Oral, Daily, For chronic pain and mood   famotidine (PEPCID) 20 mg, Oral, Daily before breakfast   Farxiga 10 mg, Oral, Daily   fluconazole (DIFLUCAN) 200 mg, Oral, Daily   folic acid (FOLVITE) 1 mg, Oral, Daily   gabapentin (NEURONTIN) 100-300 mg, Oral, See admin instructions, Take 1 capsule by mouth twice a day, then take 3 capsules at bedtime   gabapentin (NEURONTIN) 300 mg, Oral, 2 times daily   glipiZIDE (GLUCOTROL) 10 mg, Daily before breakfast   hydrALAZINE (APRESOLINE) 10 mg, Oral, 2 times daily   hydrOXYzine (ATARAX) 25 mg, Oral, Daily at bedtime   insulin aspart (NOVOLOG) 6 Units, See admin instructions   insulin glargine-yfgn (SEMGLEE) 15 Units, Subcutaneous, 2 times daily   insulin lispro (HUMALOG) 0-30 Units, Subcutaneous, As directed, INJECT SUBCUTANEOUSLY BASED ON BLOOD SUGAR RESULTS WITH MEALS THREE TIMES DAILY PRN FOR HIGH BLOOD SUGAR. MAX OF 30 UNITS IN 24 HOURS   Insulin Pen Needle 29G X 12MM MISC Per instructions   Jardiance 25 mg, Every morning   melatonin 6 mg, Oral, At  bedtime PRN   metoprolol succinate (TOPROL-XL) 25 mg, Daily   nicotine polacrilex (COMMIT) 2 mg, As needed   OLANZapine (ZYPREXA) 5 mg, Oral, Daily at bedtime   ondansetron (ZOFRAN) 4 mg, Oral, Every 8 hours PRN   pantoprazole (PROTONIX) 40 mg, Daily   polyethylene glycol powder (GLYCOLAX/MIRALAX) 17 g, Oral, 2 times daily, Until daily soft stools<BR><BR>OTC   rivastigmine (EXELON) 1.5 mg, 2 times daily   rosuvastatin (CRESTOR) 40 mg, Oral, Daily   traMADol (ULTRAM) 50 mg, Oral, Every 6 hours PRN   traZODone (DESYREL) 100 mg, Oral, Daily at bedtime  Unable to determine medication adherence at this time   History reviewed. No pertinent family history. Unable to review with family/patient at this time  Social History:  reports that he has been smoking cigarettes. He has been smoking an average of .5 packs per day. He has never used smokeless tobacco. He reports current alcohol use. He reports current drug use. Drug: Cocaine.   Exam: Current vital signs: BP (!) 175/91 (BP Location: Left Arm)   Pulse 92   Temp 98.9 F (37.2 C) (Oral)   Resp 16   Wt 45.2 kg Comment: per RN Amy  SpO2 100%   BMI 16.08 kg/m  Vital signs in last 24 hours: Temp:  [98.3 F (36.8 C)-98.9 F (37.2 C)] 98.9 F (37.2 C) (02/02  0400) Pulse Rate:  [80-105] 92 (02/02 0400) Resp:  [11-21] 16 (02/02 0400) BP: (142-208)/(75-127) 175/91 (02/02 0400) SpO2:  [98 %-100 %] 100 % (02/02 0400) Weight:  [45.2 kg] 45.2 kg (02/02 0525)   Physical Exam  Constitutional: Appears cachetic, bilateral wrist restraints in place  Psych: Affect appears frustrated but not violent at the time of my eval (s/p haldol for swinging at a tech) Eyes: No scleral injection HENT: No oropharyngeal obstruction.  MSK: no joint deformities.  Cardiovascular: Normal rate and regular rhythm. Perfusing extremities well Respiratory: Effort normal, non-labored breathing GI: Scaphoid. Firm.  Skin: Warm dry and intact visible  skin  Neuro: Mental Status: Patient is awake, alert, tracks examiner, doesn't clearly follow commands but mimics. States "yes, ma'am" to everything Cranial Nerves: II: No clear blink to threat but tracks examiner in all directions. Pupils are equal, round, and reactive to light.  III,IV, VI: EOMI to tracking examiner V: Facial sensation is symmetric to light eyelash brush VII: Facial movement is symmetric.  VIII: hearing is intact to voice (orients to examiner voice)  Remainder Unable to assess secondary to patient's mental status  Motor: Diffusely cachetic. Grossly equally strength, but not following commands. Able to keep bilateral UE antigravity 10 seconds both sides, 5 seconds bilateral legs Sensory: Grossly equally reactive to touch in all four extremities  Deep Tendon Reflexes: 2+ symmetric in bilateral patellae  Cerebellar: Unable to assess secondary to patient's mental status; coordinated for scratching head with bilateral hands Gait:  Deferred   NIHSS total 6 Score breakdown: 2 for questions, 2 for commands, 2 for aphasia  Performed at 6:30 AM   I have reviewed labs in epic and the results pertinent to this consultation are:  Basic Metabolic Panel: Recent Labs  Lab 02/17/22 1513  NA 141  K 4.1  CL 105  CO2 23  GLUCOSE 343*  BUN 29*  CREATININE 1.07  CALCIUM 8.7*    CBC: Recent Labs  Lab 02/17/22 1513  WBC 6.5  NEUTROABS 5.0  HGB 14.6  HCT 47.1  MCV 89.4  PLT 235    Coagulation Studies: No results for input(s): "LABPROT", "INR" in the last 72 hours.     Lab Results  Component Value Date   HGBA1C 10.6 (H) 01/23/2022    Lab Results  Component Value Date   CHOL 180 08/29/2020   HDL 41 08/29/2020   LDLCALC 126 (H) 08/29/2020   TRIG 66 08/29/2020   CHOLHDL 4.4 08/29/2020     I have reviewed the images obtained:  Head CT personally reviewed, agree with radiology:   Extensive, multifocal bilateral encephalomalacia, some of which appears  to be new or at least increased compared to prior examination dated 07/09/2020 although is generally age indeterminate. Recommend MR to further evaluate if there is clinical suspicion for acute diffusion restricting infarction.  MRI brain personally reviewed, agree with radiology:   1. Acute to early subacute infarct in the medial left frontal lobe, correlating with the left ACA territory and ACA-PCA watershed. No evidence of hemorrhagic transformation or significant mass effect. 2. Remote infarcts involving the bilateral occipital cortex, right parietal cortex, and posterior left frontal lobe white matter.  CTA head and neck 1. Left A2 segment occlusion, corresponding to the infarct seen on concomitant brain MRI. 2. Severe stenosis of the proximal cavernous segment of the left ICA.  CT abdomen pelvis with contrast 1. No acute findings in the abdomen pelvis. 2. Bilateral nonobstructing renal calculi. 3. Very little intra-abdominal fat.  4.  Aortic Atherosclerosis (ICD10-I70.0).  CXR personally reviewed, agree with radiology:   No active disease.   Impression: L ACA stroke, embolic (atheroembolic from left ICA stenosis versus cardioembolic versus secondary to cocaine related vasculopathy). CXR, CT Abdomen/pelvis, WBC and vitals reassuring against systemic process contributing such as underlying malignancy/infection though defer to primary team given weight loss and prior history of odd cavitary lung lesion (which seems to have resolved). Given high risk behavior of substance abuse, HIV and RPR screening is appropriate as these can contribute to stroke risk.   Recommendations: # L ACA stroke, embolic (atheroembolic from left ICA stenosis versus cardioembolic versus secondary to cocaine related vasospasm) - Stroke labs HgbA1c, fasting lipid panel, HIV, RPR - Frequent neuro checks - Echocardiogram, pending clinical course consider TEE if TTE is negative due to history of possible mitral  valve vegetation - Prophylactic therapy-Antiplatelet med: Aspirin - dose 325mg  PO or 300mg  PR, followed by 81 mg daily - Did receive a Plavix load in the ED prior to full review of chart, given possibility of infectious etiology hold off on further Plavix pending echocardiogram - Risk factor modification - Telemetry monitoring  - Blood pressure goal   -Gradual normotension given patient is out of the permissive hypertension window since symptom onset was 3 days prior to admission - PT consult, OT consult, Speech consult - Appreciate workup/management of cachexia and other comorbidities per primary team - Stroke team to follow  Lesleigh Noe MD-PhD [facility] [phone] Available 7 PM to 7 AM, outside of these hours please call Neurologist on call as listed on Amion.

## 2022-02-19 DIAGNOSIS — I639 Cerebral infarction, unspecified: Secondary | ICD-10-CM | POA: Diagnosis not present

## 2022-02-19 LAB — CBC WITH DIFFERENTIAL/PLATELET
Abs Immature Granulocytes: 0.03 10*3/uL (ref 0.00–0.07)
Basophils Absolute: 0 10*3/uL (ref 0.0–0.1)
Basophils Relative: 0 %
Eosinophils Absolute: 0.1 10*3/uL (ref 0.0–0.5)
Eosinophils Relative: 2 %
HCT: 40.3 % (ref 39.0–52.0)
Hemoglobin: 13.6 g/dL (ref 13.0–17.0)
Immature Granulocytes: 1 %
Lymphocytes Relative: 41 %
Lymphs Abs: 2.5 10*3/uL (ref 0.7–4.0)
MCH: 29.1 pg (ref 26.0–34.0)
MCHC: 33.7 g/dL (ref 30.0–36.0)
MCV: 86.1 fL (ref 80.0–100.0)
Monocytes Absolute: 0.4 10*3/uL (ref 0.1–1.0)
Monocytes Relative: 6 %
Neutro Abs: 3.1 10*3/uL (ref 1.7–7.7)
Neutrophils Relative %: 50 %
Platelets: 211 10*3/uL (ref 150–400)
RBC: 4.68 MIL/uL (ref 4.22–5.81)
RDW: 12.9 % (ref 11.5–15.5)
WBC: 6.2 10*3/uL (ref 4.0–10.5)
nRBC: 0 % (ref 0.0–0.2)

## 2022-02-19 LAB — GLUCOSE, CAPILLARY
Glucose-Capillary: 124 mg/dL — ABNORMAL HIGH (ref 70–99)
Glucose-Capillary: 168 mg/dL — ABNORMAL HIGH (ref 70–99)
Glucose-Capillary: 172 mg/dL — ABNORMAL HIGH (ref 70–99)
Glucose-Capillary: 58 mg/dL — ABNORMAL LOW (ref 70–99)
Glucose-Capillary: 62 mg/dL — ABNORMAL LOW (ref 70–99)
Glucose-Capillary: 88 mg/dL (ref 70–99)

## 2022-02-19 LAB — BASIC METABOLIC PANEL
Anion gap: 8 (ref 5–15)
BUN: 19 mg/dL (ref 8–23)
CO2: 23 mmol/L (ref 22–32)
Calcium: 8.5 mg/dL — ABNORMAL LOW (ref 8.9–10.3)
Chloride: 110 mmol/L (ref 98–111)
Creatinine, Ser: 0.7 mg/dL (ref 0.61–1.24)
GFR, Estimated: >60 mL/min (ref >60)
Glucose, Bld: 53 mg/dL — ABNORMAL LOW (ref 70–99)
Potassium: 3.4 mmol/L — ABNORMAL LOW (ref 3.5–5.1)
Sodium: 141 mmol/L (ref 135–145)

## 2022-02-19 LAB — RPR: RPR Ser Ql: NONREACTIVE

## 2022-02-19 MED ORDER — POTASSIUM CHLORIDE CRYS ER 20 MEQ PO TBCR
40.0000 meq | EXTENDED_RELEASE_TABLET | Freq: Once | ORAL | Status: AC
  Administered 2022-02-19: 40 meq via ORAL
  Filled 2022-02-19: qty 2

## 2022-02-19 MED ORDER — ORAL CARE MOUTH RINSE: 15.0000 mL | OROMUCOSAL | Status: DC | PRN

## 2022-02-19 MED ORDER — INSULIN GLARGINE-YFGN 100 UNIT/ML ~~LOC~~ SOLN
10.0000 [IU] | Freq: Every day | SUBCUTANEOUS | Status: DC
  Administered 2022-02-20 – 2022-02-23 (×4): 10 [IU] via SUBCUTANEOUS
  Filled 2022-02-19 (×5): qty 0.1

## 2022-02-19 MED ORDER — DEXTROSE IN LACTATED RINGERS 5 % IV SOLN: INTRAVENOUS | Status: DC

## 2022-02-19 NOTE — Progress Notes (Signed)
Bladder scan done, 177 ml.

## 2022-02-19 NOTE — Progress Notes (Signed)
  X-cover Note: Secure chat received from bedside RN. Pt hypoglycemic. Poor po intake. Will start D5LR @ 75 ml/hr x 10 hours. Will reduce lantus dose to 10 units daily(pt received 20 units today)   Kristopher Oppenheim, DO [facility]

## 2022-02-19 NOTE — Progress Notes (Signed)
Unable to complete admission screenings, patient did not respond verbally.

## 2022-02-19 NOTE — Progress Notes (Signed)
PROGRESS NOTE    William Acevedo  FXT:[id number] DOB: 05/14/1955 DOA: 02/17/2022 PCP: [facility]   Brief Narrative:  HPI: William Acevedo is a 67 y.o. male with medical history significant of  CAD s/p NSTEMI, hyperlipedemia, DMII insulin dependent, cerebral thrombosis with CVA, Essential hypertension, hx of polysubstance abuse , hx of endocarditis, hx of pulmonary cryptococosis, GERD, etoh abuse, Schizoaffectic disorder ,PTSD Who presents to ED with increase lethargy , vomiting decreased intake x 3 days  as well as being nonverbal which is not patient baseline.  Patient currently unable to give history , but is able to follow commands he answers yes to all questions.  ED Course:  Afeb, BP 188/89, HR 103, rr 20  Sat 99% on ra  Na 141, K 4.1, glu 343, cr 1.07  Wbc 6.5, hgb 14.6, plt 235 Lactic acid 2, repeat 1.3 GDJ:MEQAS tachycardia , RAE Ua:neg UDS, + cocaine  Assessment & Plan:   Principal Problem:   CVA (cerebral vascular accident) (HCC) Active Problems:   Alcohol use disorder   Polysubstance abuse (HCC)   DM2 (diabetes mellitus, type 2) (HCC)   Acute encephalopathy   NSTEMI (non-ST elevated myocardial infarction) (HCC)   Cocaine abuse (HCC)   Schizoaffective disorder (HCC)  Acute encephalopathy/acute to early subacute ischemic infarct in medial left frontal lobe correlating with left ACA territory and ACA-PCA watershed/hypertensive emergency: Also has left A2 segment occlusion, corresponding to the infarct along with severe stenosis of proximal cavernous segment of the left ICA.  Also has history of prior CVA.  Patient's expressive aphasia is improving, he is now able to answer most of the questions.  He has been started on aspirin and Plavix by neurology and recommendation is to continue DAPT for 3 weeks and then Plavix alone.  Patient's blood pressure is already within normal range despite of holding his amlodipine and hydralazine.  Toprol-XL is continued.   Significantly elevated LDL 217 with cholesterol 291, atorvastatin increased to 80 mg. Recent hemoglobin A1c done in January was 10.6.  Seen by PT OT and they recommended CIR.  Waiting for CIR assessment.  Further management per neurology.  Type 2 diabetes mellitus: Patient appears to be taking glipizide as well as Lantus 15 units twice daily at home.  Currently on SSI and 20 units of Lantus.  Blood sugar controlled.   CAD s/p NSTEMI: Resume cardiac medications once able to take p.o.  History of polysubstance abuse: Cocaine positive in UDS.  Also uses EtOH.  GERD: PPI  Schizoaffective disorder/PTSD: Will resume medications once he is able to tolerate p.o.  DVT prophylaxis: heparin injection 5,000 Units Start: 02/18/22 0600 SCD's Start: 02/17/22 2140   Code Status: Full Code  Family Communication:  None present at bedside.   Status is: Inpatient Remains inpatient appropriate because: Needs further neurological workup.   Estimated body mass index is 16.08 kg/m as calculated from the following:   Height as of this encounter: 5\' 6"  (1.676 m).   Weight as of this encounter: 45.2 kg.    Nutritional Assessment: Body mass index is 16.08 kg/m. Seen by dietician.  I agree with the assessment and plan as outlined below: Nutrition Status:        . Skin Assessment: I have examined the patient's skin and I agree with the wound assessment as performed by the wound care RN as outlined below:    Consultants:  Neurology  Procedures:  None  Antimicrobials:  Anti-infectives (From admission, onward)    None  Subjective:  Patient seen and examined.  He is much more awake and alert today and he is responding with appropriate answers as well.  Objective: Vitals:   02/18/22 2038 02/19/22 0002 02/19/22 0500 02/19/22 0718  BP: (!) 146/71 128/72 (!) 153/83 117/67  Pulse: 80 76 66 61  Resp: 16 16 16 16   Temp: 97.8 F (36.6 C) (!) 97.5 F (36.4 C) (!) 97.4 F (36.3 C)  97.9 F (36.6 C)  TempSrc: Oral Axillary Axillary Oral  SpO2: 100% 100% 100% 100%  Weight:      Height:        Intake/Output Summary (Last 24 hours) at 02/19/2022 1026 Last data filed at 02/19/2022 1022 Gross per 24 hour  Intake 1437.04 ml  Output 600 ml  Net 837.04 ml    Filed Weights   02/18/22 0525  Weight: 45.2 kg    Examination:  General exam: Appears calm and comfortable  Respiratory system: Clear to auscultation. Respiratory effort normal. Cardiovascular system: S1 & S2 heard, RRR. No JVD, murmurs, rubs, gallops or clicks. No pedal edema. Gastrointestinal system: Abdomen is nondistended, soft and nontender. No organomegaly or masses felt. Normal bowel sounds heard. Central nervous system: Alert and oriented. No focal neurological deficits. Extremities: Symmetric 5 x 5 power. Skin: No rashes, lesions or ulcers.  Psychiatry: Judgement and insight appear poor  Data Reviewed: I have personally reviewed following labs and imaging studies  CBC: Recent Labs  Lab 02/17/22 1513 02/18/22 0652 02/19/22 0248  WBC 6.5 6.7 6.2  NEUTROABS 5.0  --  3.1  HGB 14.6 15.2 13.6  HCT 47.1 49.1 40.3  MCV 89.4 89.9 86.1  PLT 235 227 932    Basic Metabolic Panel: Recent Labs  Lab 02/17/22 1513 02/18/22 0652 02/19/22 0248  NA 141 144 141  K 4.1 4.0 3.4*  CL 105 108 110  CO2 23 19* 23  GLUCOSE 343* 208* 53*  BUN 29* 20 19  CREATININE 1.07 1.02 0.70  CALCIUM 8.7* 9.1 8.5*    GFR: Estimated Creatinine Clearance: 56.5 mL/min (by C-G formula based on SCr of 0.7 mg/dL). Liver Function Tests: Recent Labs  Lab 02/17/22 1513 02/18/22 0652  AST 30 27  ALT 23 21  ALKPHOS 58 62  BILITOT 0.8 0.8  PROT 7.4 7.3  ALBUMIN 3.6 3.7    Recent Labs  Lab 02/17/22 2119  LIPASE 31    Recent Labs  Lab 02/17/22 1514  AMMONIA <10    Coagulation Profile: No results for input(s): "INR", "PROTIME" in the last 168 hours. Cardiac Enzymes: No results for input(s): "CKTOTAL",  "CKMB", "CKMBINDEX", "TROPONINI" in the last 168 hours. BNP (last 3 results) No results for input(s): "PROBNP" in the last 8760 hours. HbA1C: No results for input(s): "HGBA1C" in the last 72 hours. CBG: Recent Labs  Lab 02/18/22 0928 02/18/22 1210 02/18/22 1625 02/18/22 2045 02/19/22 0635  GLUCAP 99 117* 178* 71 88    Lipid Profile: Recent Labs    02/18/22 0652  CHOL 291*  HDL 56  LDLCALC 217*  TRIG 88  CHOLHDL 5.2    Thyroid Function Tests: No results for input(s): "TSH", "T4TOTAL", "FREET4", "T3FREE", "THYROIDAB" in the last 72 hours. Anemia Panel: No results for input(s): "VITAMINB12", "FOLATE", "FERRITIN", "TIBC", "IRON", "RETICCTPCT" in the last 72 hours. Sepsis Labs: Recent Labs  Lab 02/17/22 1514 02/17/22 2119  LATICACIDVEN 2.0* 1.3     Recent Results (from the past 240 hour(s))  Resp panel by RT-PCR (RSV, Flu A&B, Covid) Anterior Nasal Swab  Status: None   Collection Time: 02/17/22  3:48 PM   Specimen: Anterior Nasal Swab  Result Value Ref Range Status   SARS Coronavirus 2 by RT PCR NEGATIVE NEGATIVE Final    Comment: (NOTE) SARS-CoV-2 target nucleic acids are NOT DETECTED.  The SARS-CoV-2 RNA is generally detectable in upper respiratory specimens during the acute phase of infection. The lowest concentration of SARS-CoV-2 viral copies this assay can detect is 138 copies/mL. A negative result does not preclude SARS-Cov-2 infection and should not be used as the sole basis for treatment or other patient management decisions. A negative result may occur with  improper specimen collection/handling, submission of specimen other than nasopharyngeal swab, presence of viral mutation(s) within the areas targeted by this assay, and inadequate number of viral copies(<138 copies/mL). A negative result must be combined with clinical observations, patient history, and epidemiological information. The expected result is Negative.  Fact Sheet for Patients:   EntrepreneurPulse.com.au  Fact Sheet for Healthcare Providers:  IncredibleEmployment.be  This test is no t yet approved or cleared by the [place] FDA and  has been authorized for detection and/or diagnosis of SARS-CoV-2 by FDA under an Emergency Use Authorization (EUA). This EUA will remain  in effect (meaning this test can be used) for the duration of the COVID-19 declaration under Section 564(b)(1) of the Act, 21 U.S.C.section 360bbb-3(b)(1), unless the authorization is terminated  or revoked sooner.       Influenza A by PCR NEGATIVE NEGATIVE Final   Influenza B by PCR NEGATIVE NEGATIVE Final    Comment: (NOTE) The Xpert Xpress SARS-CoV-2/FLU/RSV plus assay is intended as an aid in the diagnosis of influenza from Nasopharyngeal swab specimens and should not be used as a sole basis for treatment. Nasal washings and aspirates are unacceptable for Xpert Xpress SARS-CoV-2/FLU/RSV testing.  Fact Sheet for Patients: EntrepreneurPulse.com.au  Fact Sheet for Healthcare Providers: IncredibleEmployment.be  This test is not yet approved or cleared by the [place] FDA and has been authorized for detection and/or diagnosis of SARS-CoV-2 by FDA under an Emergency Use Authorization (EUA). This EUA will remain in effect (meaning this test can be used) for the duration of the COVID-19 declaration under Section 564(b)(1) of the Act, 21 U.S.C. section 360bbb-3(b)(1), unless the authorization is terminated or revoked.     Resp Syncytial Virus by PCR NEGATIVE NEGATIVE Final    Comment: (NOTE) Fact Sheet for Patients: EntrepreneurPulse.com.au  Fact Sheet for Healthcare Providers: IncredibleEmployment.be  This test is not yet approved or cleared by the [place] FDA and has been authorized for detection and/or diagnosis of SARS-CoV-2 by FDA under an Emergency Use  Authorization (EUA). This EUA will remain in effect (meaning this test can be used) for the duration of the COVID-19 declaration under Section 564(b)(1) of the Act, 21 U.S.C. section 360bbb-3(b)(1), unless the authorization is terminated or revoked.  Performed at [facility], [place] [address], [place], [place] [address]      Radiology Studies: ECHOCARDIOGRAM LIMITED BUBBLE STUDY  Result Date: 02/18/2022    ECHOCARDIOGRAM LIMITED REPORT   Patient Name:   William Acevedo Date of Exam: 02/18/2022 Medical Rec #:  [id number]           Height:       66.0 in Accession #:    [id number]          Weight:       99.6 lb Date of Birth:  [birth year]  BSA:          1.487 m Patient Age:    19 years            BP:           175/91 mmHg Patient Gender: M                   HR:           80 bpm. Exam Location:  Inpatient Procedure: Limited Echo, Color Doppler, Cardiac Doppler and Saline Contrast            Bubble Study Indications:    stroke  History:        Patient has prior history of Echocardiogram examinations, most                 recent 07/10/2020. CAD and Previous Myocardial Infarction,                 Stroke; Risk Factors:Diabetes.  Sonographer:    Harvie Junior Referring Phys: [id number] SARA-MAIZ A THOMAS  Sonographer Comments: Technically difficult study due to poor echo windows. IMPRESSIONS  1. Left ventricular ejection fraction, by estimation, is 55 to 60%. The left ventricle has normal function. The left ventricle has no regional wall motion abnormalities. There is mild asymmetric left ventricular hypertrophy of the basal and septal segments. Left ventricular diastolic parameters are consistent with Grade I diastolic dysfunction (impaired relaxation). Elevated left ventricular end-diastolic pressure.  2. Right ventricular systolic function is normal. The right ventricular size is normal.  3. The mitral valve is abnormal. Trivial mitral valve regurgitation. No evidence of mitral stenosis.  Severe mitral annular calcification.  4. The aortic valve is tricuspid. There is moderate calcification of the aortic valve. There is moderate thickening of the aortic valve. Aortic valve regurgitation is not visualized. Aortic valve sclerosis/calcification is present, without any evidence of aortic stenosis.  5. The inferior vena cava is normal in size with greater than 50% respiratory variability, suggesting right atrial pressure of 3 mmHg. FINDINGS  Left Ventricle: Left ventricular ejection fraction, by estimation, is 55 to 60%. The left ventricle has normal function. The left ventricle has no regional wall motion abnormalities. The left ventricular internal cavity size was normal in size. There is  mild asymmetric left ventricular hypertrophy of the basal and septal segments. Left ventricular diastolic parameters are consistent with Grade I diastolic dysfunction (impaired relaxation). Elevated left ventricular end-diastolic pressure. Right Ventricle: The right ventricular size is normal. No increase in right ventricular wall thickness. Right ventricular systolic function is normal. Left Atrium: Left atrial size was normal in size. Right Atrium: Right atrial size was normal in size. Pericardium: There is no evidence of pericardial effusion. Mitral Valve: The mitral valve is abnormal. There is moderate thickening of the mitral valve leaflet(s). There is moderate calcification of the mitral valve leaflet(s). Severe mitral annular calcification. Trivial mitral valve regurgitation. No evidence of mitral valve stenosis. Tricuspid Valve: The tricuspid valve is normal in structure. Tricuspid valve regurgitation is not demonstrated. No evidence of tricuspid stenosis. Aortic Valve: The aortic valve is tricuspid. There is moderate calcification of the aortic valve. There is moderate thickening of the aortic valve. Aortic valve regurgitation is not visualized. Aortic valve sclerosis/calcification is present, without any   evidence of aortic stenosis. Aortic valve mean gradient measures 1.0 mmHg. Aortic valve peak gradient measures 2.6 mmHg. Aortic valve area, by VTI measures 3.01 cm. Pulmonic Valve: The pulmonic valve was normal in structure. Pulmonic  valve regurgitation is not visualized. No evidence of pulmonic stenosis. Aorta: The aortic root is normal in size and structure. Venous: The inferior vena cava is normal in size with greater than 50% respiratory variability, suggesting right atrial pressure of 3 mmHg. IAS/Shunts: The interatrial septum appears to be lipomatous. No atrial level shunt detected by color flow Doppler. Agitated saline contrast was given intravenously to evaluate for intracardiac shunting. LEFT VENTRICLE PLAX 2D LVIDd:         3.30 cm     Diastology LVIDs:         2.40 cm     LV e' medial:    5.66 cm/s LV PW:         1.20 cm     LV E/e' medial:  19.6 LV IVS:        1.20 cm     LV e' lateral:   6.20 cm/s LVOT diam:     1.80 cm     LV E/e' lateral: 17.9 LV SV:         54 LV SV Index:   36 LVOT Area:     2.54 cm  LV Volumes (MOD) LV vol d, MOD A2C: 64.6 ml LV vol d, MOD A4C: 64.3 ml LV vol s, MOD A2C: 26.2 ml LV vol s, MOD A4C: 25.7 ml LV SV MOD A2C:     38.4 ml LV SV MOD A4C:     64.3 ml LV SV MOD BP:      38.3 ml LEFT ATRIUM         Index LA diam:    2.70 cm 1.82 cm/m  AORTIC VALVE                    PULMONIC VALVE AV Area (Vmax):    2.84 cm     PV Vmax:       1.00 m/s AV Area (Vmean):   3.17 cm     PV Peak grad:  4.0 mmHg AV Area (VTI):     3.01 cm AV Vmax:           81.30 cm/s AV Vmean:          54.200 cm/s AV VTI:            0.180 m AV Peak Grad:      2.6 mmHg AV Mean Grad:      1.0 mmHg LVOT Vmax:         90.70 cm/s LVOT Vmean:        67.500 cm/s LVOT VTI:          0.213 m LVOT/AV VTI ratio: 1.18  AORTA Ao Root diam: 3.00 cm MITRAL VALVE MV Area (PHT): 3.97 cm     SHUNTS MV Decel Time: 191 msec     Systemic VTI:  0.21 m MV E velocity: 111.00 cm/s  Systemic Diam: 1.80 cm MV A velocity: 157.00 cm/s MV  E/A ratio:  0.71 Charlton Haws MD Electronically signed by Charlton Haws MD Signature Date/Time: 02/18/2022/3:25:53 PM    Final    CT Angio Head W or Wo Contrast  Result Date: 02/17/2022 CLINICAL DATA:  Stroke follow-up EXAM: CT ANGIOGRAPHY HEAD TECHNIQUE: Multidetector CT imaging of the head was performed using the standard protocol during bolus administration of intravenous contrast. Multiplanar CT image reconstructions and MIPs were obtained to evaluate the vascular anatomy. RADIATION DOSE REDUCTION: This exam was performed according to the departmental dose-optimization program which includes automated exposure control, adjustment of  the mA and/or kV according to patient size and/or use of iterative reconstruction technique. CONTRAST:  33mL OMNIPAQUE IOHEXOL 350 MG/ML SOLN COMPARISON:  Brain MRI 02/17/2022 FINDINGS: POSTERIOR CIRCULATION: --Vertebral arteries: Normal --Inferior cerebellar arteries: Normal. --Basilar artery: Normal. --Superior cerebellar arteries: Normal. --Posterior cerebral arteries: Normal. ANTERIOR CIRCULATION: --Intracranial internal carotid arteries: Severe stenosis of the proximal cavernous segment of the left ICA. Bilateral atherosclerotic calcification. --Anterior cerebral arteries (ACA): Left A2 segment occlusion, corresponding to the infarct seen on concomitant brain MRI. --Middle cerebral arteries (MCA): Normal. Venous sinuses: As permitted by contrast timing, patent. Anatomic variants: None Review of the MIP images confirms the above findings. IMPRESSION: 1. Left A2 segment occlusion, corresponding to the infarct seen on concomitant brain MRI. 2. Severe stenosis of the proximal cavernous segment of the left ICA. Electronically Signed   By: Ulyses Jarred M.D.   On: 02/17/2022 20:46   MR BRAIN WO CONTRAST  Result Date: 02/17/2022 CLINICAL DATA:  Stroke suspected EXAM: MRI HEAD WITHOUT CONTRAST TECHNIQUE: Multiplanar, multiecho pulse sequences of the brain and surrounding structures  were obtained without intravenous contrast. COMPARISON:  07/10/2020 MRI head, correlation is made with CT head 02/17/2022 FINDINGS: Evaluation is somewhat limited by motion artifact. Brain: Restricted diffusion with ADC correlate in the medial left frontal lobe, primarily cortical and subcortical, extending from the anterior inferior left frontal lobe to the more superior and posterior left frontal lobe (series 5, images 15-27, 29, and 31), correlating with the left ACA territory and ACA-PCA watershed. These areas are associated with T2 hyperintense signal, likely cytotoxic edema. No acute hemorrhage, mass, mass effect, or midline shift. No hydrocephalus or extra-axial collection. Remote infarcts involving the bilateral occipital cortex, right parietal cortex, and posterior left frontal lobe white matter. Confluent T2 hyperintense signal in the periventricular white matter, likely the sequela of moderate to severe chronic small vessel ischemic disease. Vascular: Normal proximal arterial flow voids. Skull and upper cervical spine: Normal marrow signal. Sinuses/Orbits: Mucosal thickening in the posterior left ethmoid air cells. No acute finding in the orbits. Other: The mastoids are well aerated. IMPRESSION: 1. Acute to early subacute infarct in the medial left frontal lobe, correlating with the left ACA territory and ACA-PCA watershed. No evidence of hemorrhagic transformation or significant mass effect. 2. Remote infarcts involving the bilateral occipital cortex, right parietal cortex, and posterior left frontal lobe white matter. These results were called by telephone at the time of interpretation on 02/17/2022 at 7:02 pm to provider ZAMMIT , who verbally acknowledged these results. Electronically Signed   By: Merilyn Baba M.D.   On: 02/17/2022 19:03   CT Head Wo Contrast  Result Date: 02/17/2022 CLINICAL DATA:  Altered mental status, lethargy EXAM: CT HEAD WITHOUT CONTRAST TECHNIQUE: Contiguous axial images were  obtained from the base of the skull through the vertex without intravenous contrast. RADIATION DOSE REDUCTION: This exam was performed according to the departmental dose-optimization program which includes automated exposure control, adjustment of the mA and/or kV according to patient size and/or use of iterative reconstruction technique. COMPARISON:  CT brain, 07/09/2020 FINDINGS: Brain: Extensive, multifocal bilateral encephalomalacia, some of which appears to be new or at least increased compared to prior examination, for example in the medial right frontal lobe (series 2, image 18) and in the right parietal vertex (series 2, image 18). No evidence of hemorrhage, hydrocephalus, extra-axial collection or mass lesion/mass effect. Vascular: No hyperdense vessel or unexpected calcification. Skull: Normal. Negative for fracture or focal lesion. Sinuses/Orbits: No acute finding. Other: None. IMPRESSION:  Extensive, multifocal bilateral encephalomalacia, some of which appears to be new or at least increased compared to prior examination dated 07/09/2020 although is generally age indeterminate. Recommend MR to further evaluate if there is clinical suspicion for acute diffusion restricting infarction. Electronically Signed   By: Delanna Ahmadi M.D.   On: 02/17/2022 16:21   CT ABDOMEN PELVIS W CONTRAST  Result Date: 02/17/2022 CLINICAL DATA:  Abdominal pain.  Acute, nonlocalized. EXAM: CT ABDOMEN AND PELVIS WITH CONTRAST TECHNIQUE: Multidetector CT imaging of the abdomen and pelvis was performed using the standard protocol following bolus administration of intravenous contrast. RADIATION DOSE REDUCTION: This exam was performed according to the departmental dose-optimization program which includes automated exposure control, adjustment of the mA and/or kV according to patient size and/or use of iterative reconstruction technique. CONTRAST:  149mL OMNIPAQUE IOHEXOL 300 MG/ML  SOLN COMPARISON:  None Available. FINDINGS:  Lower chest: Mild atelectasis in the RIGHT middle lobe. Otherwise lung bases clear. Hepatobiliary: No focal hepatic lesion. Normal gallbladder. No biliary duct dilatation. Common bile duct is normal. Pancreas: Pancreas normal Spleen: Normal spleen Adrenals/urinary tract: Adrenal glands normal. Bilateral nonobstructing renal calculi. No enhancing renal cortical lesion. Cortical scarring in the RIGHT kidney. Bladder unremarkable Stomach/Bowel: Stomach, small-bowel and cecum are normal. The appendix is not identified but there is no pericecal inflammation to suggest appendicitis. The colon and rectosigmoid colon are normal. Vascular/Lymphatic: Abdominal aorta is normal caliber with atherosclerotic calcification. There is no retroperitoneal or periportal lymphadenopathy. No pelvic lymphadenopathy. Reproductive: Prostate unremarkable Other: Very little intra-abdominal fat.  No free fluid or free air. Musculoskeletal: No aggressive osseous lesion. IMPRESSION: 1. No acute findings in the abdomen pelvis. 2. Bilateral nonobstructing renal calculi. 3. Very little intra-abdominal fat. 4.  Aortic Atherosclerosis (ICD10-I70.0). Electronically Signed   By: Suzy Bouchard M.D.   On: 02/17/2022 16:18   DG Chest Portable 1 View  Result Date: 02/17/2022 CLINICAL DATA:  Altered mental status EXAM: PORTABLE CHEST 1 VIEW COMPARISON:  CXR 01/23/22 FINDINGS: No pleural effusion. No pneumothorax. No focal airspace opacity. Normal cardiac and mediastinal contours. No radiographically apparent displaced rib fractures. Visualized upper abdomen is unremarkable. IMPRESSION: No active disease. Electronically Signed   By: Marin Roberts M.D.   On: 02/17/2022 15:17    Scheduled Meds:  aspirin EC  81 mg Oral Daily   atorvastatin  80 mg Oral QHS   clopidogrel  75 mg Oral Daily   dapagliflozin propanediol  10 mg Oral Daily   DULoxetine  60 mg Oral Daily   famotidine  20 mg Oral QAC breakfast   folic acid  1 mg Oral Daily   gabapentin  300  mg Oral BID   heparin  5,000 Units Subcutaneous Q8H   insulin aspart  0-9 Units Subcutaneous TID WC   insulin glargine-yfgn  20 Units Subcutaneous Daily   metoprolol succinate  25 mg Oral Daily   OLANZapine  5 mg Oral QHS   pantoprazole  40 mg Oral Daily   potassium chloride  40 mEq Oral Once   traZODone  100 mg Oral QHS   Continuous Infusions:     LOS: 2 days   Darliss Cheney, MD [facility]  02/19/2022, 10:26 AM   *Please note that this is a verbal dictation therefore any spelling or grammatical errors are due to the "[place]" system interpretation.  Please page via [place] and do not message via secure chat for urgent patient care matters. Secure chat can be used for non urgent patient care matters.  How to contact the [facility] Attending or Consulting provider [place] or covering provider during after hours [place], for this patient?  Check the care team in [facility] and look for a) attending/consulting [facility] provider listed and b) the [facility] team listed. Page or secure chat [id number]. Log into [email or web address] and use [place]'s universal password to access. If you do not have the password, please contact the hospital operator. Locate the [facility] provider you are looking for under [facility] and page to a number that you can be directly reached. If you still have difficulty reaching the provider, please page the [facility] (Director on Call) for the Hospitalists listed on amion for assistance.

## 2022-02-19 NOTE — Progress Notes (Signed)
Inpatient Rehab Admissions Coordinator:    I met with Pt. At bedside to discuss potential CIR admit. He states interest but was not able to answer more complex questions. States that his brother is able to provide 24/7 support at discharge. I will reach out to his brother to confirm this plan.   Clemens Catholic, [place], [place] Admissions Coordinator  [phone] ([place]) [phone] (office)

## 2022-02-19 NOTE — Progress Notes (Addendum)
STROKE TEAM PROGRESS NOTE   INTERVAL HISTORY No family at the bedside.    Consistent neuro exam.  Patient somewhat abulic and non-cooperative, combined with aphasia.    Stroke Team will sign off with recommendations as below.   Vitals:   02/19/22 0002 02/19/22 0500 02/19/22 0718 02/19/22 1109  BP: 128/72 (!) 153/83 117/67 122/70  Pulse: 76 66 61 66  Resp: 16 16 16    Temp: (!) 97.5 F (36.4 C) (!) 97.4 F (36.3 C) 97.9 F (36.6 C)   TempSrc: Axillary Axillary Oral   SpO2: 100% 100% 100%   Weight:      Height:       CBC:  Recent Labs  Lab 02/17/22 1513 02/18/22 0652 02/19/22 0248  WBC 6.5 6.7 6.2  NEUTROABS 5.0  --  3.1  HGB 14.6 15.2 13.6  HCT 47.1 49.1 40.3  MCV 89.4 89.9 86.1  PLT 235 227 267    Basic Metabolic Panel:  Recent Labs  Lab 02/18/22 0652 02/19/22 0248  NA 144 141  K 4.0 3.4*  CL 108 110  CO2 19* 23  GLUCOSE 208* 53*  BUN 20 19  CREATININE 1.02 0.70  CALCIUM 9.1 8.5*    Lipid Panel:  Recent Labs  Lab 02/18/22 0652  CHOL 291*  TRIG 88  HDL 56  CHOLHDL 5.2  VLDL 18  LDLCALC 217*    HgbA1c: No results for input(s): "HGBA1C" in the last 168 hours. Urine Drug Screen:  Recent Labs  Lab 02/17/22 1635  LABOPIA NONE DETECTED  COCAINSCRNUR POSITIVE*  LABBENZ NONE DETECTED  AMPHETMU NONE DETECTED  THCU NONE DETECTED  LABBARB NONE DETECTED     Alcohol Level No results for input(s): "ETH" in the last 168 hours.  IMAGING past 24 hours ECHOCARDIOGRAM LIMITED BUBBLE STUDY  Result Date: 02/18/2022    ECHOCARDIOGRAM LIMITED REPORT   Patient Name:   William Acevedo Date of Exam: 02/18/2022 Medical Rec #:  [id number]           Height:       66.0 in Accession #:    [id number]          Weight:       99.6 lb Date of Birth:  August 31, 1955           BSA:          1.487 m Patient Age:    67 years            BP:           175/91 mmHg Patient Gender: M                   HR:           80 bpm. Exam Location:  Inpatient Procedure: Limited Echo, Color  Doppler, Cardiac Doppler and Saline Contrast            Bubble Study Indications:    stroke  History:        Patient has prior history of Echocardiogram examinations, most                 recent 07/10/2020. CAD and Previous Myocardial Infarction,                 Stroke; Risk Factors:Diabetes.  Sonographer:    Harvie Junior Referring Phys: [id number] SARA-MAIZ A THOMAS  Sonographer Comments: Technically difficult study due to poor echo windows. IMPRESSIONS  1. Left ventricular ejection fraction, by estimation, is 55 to 60%.  The left ventricle has normal function. The left ventricle has no regional wall motion abnormalities. There is mild asymmetric left ventricular hypertrophy of the basal and septal segments. Left ventricular diastolic parameters are consistent with Grade I diastolic dysfunction (impaired relaxation). Elevated left ventricular end-diastolic pressure.  2. Right ventricular systolic function is normal. The right ventricular size is normal.  3. The mitral valve is abnormal. Trivial mitral valve regurgitation. No evidence of mitral stenosis. Severe mitral annular calcification.  4. The aortic valve is tricuspid. There is moderate calcification of the aortic valve. There is moderate thickening of the aortic valve. Aortic valve regurgitation is not visualized. Aortic valve sclerosis/calcification is present, without any evidence of aortic stenosis.  5. The inferior vena cava is normal in size with greater than 50% respiratory variability, suggesting right atrial pressure of 3 mmHg. FINDINGS  Left Ventricle: Left ventricular ejection fraction, by estimation, is 55 to 60%. The left ventricle has normal function. The left ventricle has no regional wall motion abnormalities. The left ventricular internal cavity size was normal in size. There is  mild asymmetric left ventricular hypertrophy of the basal and septal segments. Left ventricular diastolic parameters are consistent with Grade I diastolic dysfunction  (impaired relaxation). Elevated left ventricular end-diastolic pressure. Right Ventricle: The right ventricular size is normal. No increase in right ventricular wall thickness. Right ventricular systolic function is normal. Left Atrium: Left atrial size was normal in size. Right Atrium: Right atrial size was normal in size. Pericardium: There is no evidence of pericardial effusion. Mitral Valve: The mitral valve is abnormal. There is moderate thickening of the mitral valve leaflet(s). There is moderate calcification of the mitral valve leaflet(s). Severe mitral annular calcification. Trivial mitral valve regurgitation. No evidence of mitral valve stenosis. Tricuspid Valve: The tricuspid valve is normal in structure. Tricuspid valve regurgitation is not demonstrated. No evidence of tricuspid stenosis. Aortic Valve: The aortic valve is tricuspid. There is moderate calcification of the aortic valve. There is moderate thickening of the aortic valve. Aortic valve regurgitation is not visualized. Aortic valve sclerosis/calcification is present, without any  evidence of aortic stenosis. Aortic valve mean gradient measures 1.0 mmHg. Aortic valve peak gradient measures 2.6 mmHg. Aortic valve area, by VTI measures 3.01 cm. Pulmonic Valve: The pulmonic valve was normal in structure. Pulmonic valve regurgitation is not visualized. No evidence of pulmonic stenosis. Aorta: The aortic root is normal in size and structure. Venous: The inferior vena cava is normal in size with greater than 50% respiratory variability, suggesting right atrial pressure of 3 mmHg. IAS/Shunts: The interatrial septum appears to be lipomatous. No atrial level shunt detected by color flow Doppler. Agitated saline contrast was given intravenously to evaluate for intracardiac shunting. LEFT VENTRICLE PLAX 2D LVIDd:         3.30 cm     Diastology LVIDs:         2.40 cm     LV e' medial:    5.66 cm/s LV PW:         1.20 cm     LV E/e' medial:  19.6 LV IVS:         1.20 cm     LV e' lateral:   6.20 cm/s LVOT diam:     1.80 cm     LV E/e' lateral: 17.9 LV SV:         54 LV SV Index:   36 LVOT Area:     2.54 cm  LV Volumes (MOD) LV vol d, MOD  A2C: 64.6 ml LV vol d, MOD A4C: 64.3 ml LV vol s, MOD A2C: 26.2 ml LV vol s, MOD A4C: 25.7 ml LV SV MOD A2C:     38.4 ml LV SV MOD A4C:     64.3 ml LV SV MOD BP:      38.3 ml LEFT ATRIUM         Index LA diam:    2.70 cm 1.82 cm/m  AORTIC VALVE                    PULMONIC VALVE AV Area (Vmax):    2.84 cm     PV Vmax:       1.00 m/s AV Area (Vmean):   3.17 cm     PV Peak grad:  4.0 mmHg AV Area (VTI):     3.01 cm AV Vmax:           81.30 cm/s AV Vmean:          54.200 cm/s AV VTI:            0.180 m AV Peak Grad:      2.6 mmHg AV Mean Grad:      1.0 mmHg LVOT Vmax:         90.70 cm/s LVOT Vmean:        67.500 cm/s LVOT VTI:          0.213 m LVOT/AV VTI ratio: 1.18  AORTA Ao Root diam: 3.00 cm MITRAL VALVE MV Area (PHT): 3.97 cm     SHUNTS MV Decel Time: 191 msec     Systemic VTI:  0.21 m MV E velocity: 111.00 cm/s  Systemic Diam: 1.80 cm MV A velocity: 157.00 cm/s MV E/A ratio:  0.71 Charlton Haws MD Electronically signed by Charlton Haws MD Signature Date/Time: 02/18/2022/3:25:53 PM    Final     PHYSICAL EXAM Constitutional: Appears cachetic middle-aged African-American male, bilateral wrist restraints in place  Psych: Affect appears labile, flat to frustrated HEENT: [place], AT MSK: no joint deformities.  Cardiovascular: Normal rate and regular rhythm. Perfusing extremities well Respiratory: Effort normal, non-labored breathing GI: Scaphoid. Firm.  Skin: Warm dry and intact visible skin   Neuro: Mental Status: Patient is awake, alert, tracks examiner, doesn't clearly follow commands but mimics. Oriented to self and place only. States, "27" when asked his age, says month is "March" and year is "3". Says "I'm fine" and "Sure" with repetition.  Decreased attention. Abulia present.  Cranial Nerves: II: No clear blink to  threat but tracks examiner in all directions. PERRL. III,IV, VI: EOMI V: Facial sensation is symmetric to light touch VII: Facial movement is symmetric.  VIII: hearing is intact to voice Remainder Unable to assess secondary to patient's uncooperation/inability to follow commands Motor: Diffusely cachetic. Grossly equally strength, but not following commands.  Possible slight drift BLE, unsure if this is d/t motivation or weakness.  No drift BUE. Sensory: Report of decreased sensation RLE, but able to feel in R foot.  Cerebellar: Uncooperative vs unable to assess d/t mental status Gait:  Deferred  ASSESSMENT/PLAN William Acevedo is a 67 y.o. male with a past medical history significant for uncontrolled hypertension, hyperlipidemia, uncontrolled diabetes, poly substance abuse (cocaine, tobacco, alcohol), prior pulmonary TB, and prior c/f possible cryptococcal pneumonia (10/2020) Presented d/t family reporting he had been lethargic, vomiting with decreased intake for 3 days and nonverbal Multiple admissions for strokes in the setting of ongoing cocaine use (March 2022, May  2022, in June 2022), and was recently brought to the ED in early January as an IVC, cleared by psychiatry and plan for follow-up with the [place]. LKW: 3 days prior to admission Thrombolytic given?: No, out of the window IA performed?: No, out of the window  Acute Left ACA Ischemic Stroke Etiology:  atheroembolic vs cardioembolic vs secondary to cocaine-related vasculopathy Code Stroke: Extensive, multifocal bilateral encephalomalacia, some of which appears to be new or at least increased compared to prior examination dated 07/09/2020 although is generally age indeterminate  CTA head & neck: Left A2 segment occlusion, corresponding to the infarct seen on concomitant brain MRI. Severe stenosis of the proximal cavernous segment of the left ICA.  MRI: Acute to early subacute infarct in the medial left frontal lobe,  correlating with the left ACA territory and ACA-PCA watershed. No evidence of hemorrhagic transformation or significant mass effect. Remote infarcts involving the bilateral occipital cortex, right parietal cortex, and posterior left frontal lobe white matter.  2D Echo: LVEF 55-60%. Grade I diastolic dysfunction, elevated LV end-diastolic pressure.  Abnormal Mitral Valve, Trivial MVR, Severe Mitral annular calcification Tricuspid Aortic Valve, Moderate Calcification, Moderate Thickening  LDL 217 HgbA1c 10.6 VTE prophylaxis - SCDs    Diet   Diet heart healthy/carb modified Room service appropriate? Yes; Fluid consistency: Thin   aspirin 81 mg daily prior to admission, now on aspirin 81 mg daily and clopidogrel 75 mg daily for 3 weeks, then Plavix. Patient reportedly taking aspirin 81 at home, unknown compliance history.  Therapy recommendations:  AIR Disposition:  pending  Hypertension Home meds:  norvasc, toprol-xl 50mg  Permissive hypertension (OK if < 220/120) but gradually normalize in 5-7 days Long-term BP goal normotensive  Hyperlipidemia Home meds: lipitor 40mg  LDL 217, goal < 70 Increased Lipitor to 80mg , ?medication compliance Continue statin at discharge  Diabetes type II Uncontrolled Home meds:  farxiga, glipizide, insulin HgbA1c 10.6, goal < 7.0 CBGs Recent Labs    02/18/22 1625 02/18/22 2045 02/19/22 0635  GLUCAP 178* 71 88     SSI Recommend Diabetes educator   Other Stroke Risk Factors Advanced Age >/= 62  Cigarette smoker: advised to stop smoking ETOH use, alcohol level <10, advised to drink no more than 1-2 drink(s) a day Substance abuse - UDS: Cocaine POSITIVE. Patient advised to stop using due to stroke risk.  Hx stroke/TIA  Other Active Problems ?Schizoaffective Disorder disorder Zyprexa, Trazadone at home  Hospital day # 2  Stroke team will sign off with recommendations above. Please recall with any further needs.  Thank you for this  consult.    Pt seen by Neuro NP/APP and later by MD. Note/plan to be edited by MD as needed.    Otelia Santee, DNP, AGACNP-BC [facility] Please use AMION for pager and EPIC for messaging ATTENDING ATTESTATION:  67 year old left frontal CVA with left A2 segment occlusion.  Stroke workup has been completed.  Patient is abstaining from cocaine use as this could lead to further vasospasm and strokes.  DAPT for 3 weeks then aspirin 81 mg alone.  Follow-up in stroke clinic outpatient with Dr. Fara Olden neurological.  Neurology to sign off please call with questions.  Dr. Reeves Forth evaluated pt independently, reviewed imaging, chart, labs. Discussed and formulated plan with the Resident/APP. Changes were made to the note where appropriate. Please see APP/resident note above for details.   Total 36 minutes spent on counseling patient and coordinating care, writing notes and reviewing chart.  MDM: Moderate/High. Pertinent labs, imaging  results reviewed by me and considered in my decision making. Independently reviewed imaging. Medical records reviewed. Jesi Jurgens,MD      To contact Stroke Continuity provider, please refer to [email or web address]. After hours, contact General Neurology

## 2022-02-20 DIAGNOSIS — I639 Cerebral infarction, unspecified: Secondary | ICD-10-CM | POA: Diagnosis not present

## 2022-02-20 LAB — GLUCOSE, CAPILLARY
Glucose-Capillary: 184 mg/dL — ABNORMAL HIGH (ref 70–99)
Glucose-Capillary: 212 mg/dL — ABNORMAL HIGH (ref 70–99)
Glucose-Capillary: 60 mg/dL — ABNORMAL LOW (ref 70–99)
Glucose-Capillary: 88 mg/dL (ref 70–99)
Glucose-Capillary: 128 mg/dL — ABNORMAL HIGH (ref 70–99)
Glucose-Capillary: 129 mg/dL — ABNORMAL HIGH (ref 70–99)

## 2022-02-20 LAB — LACTIC ACID, PLASMA: Lactic Acid, Venous: 1.5 mmol/L (ref 0.5–1.9)

## 2022-02-20 MED ORDER — SODIUM CHLORIDE 0.9 % IV SOLN
INTRAVENOUS | Status: DC
  Administered 2022-02-21: 125 mL/h via INTRAVENOUS
  Administered 2022-02-21: 75 mL/h via INTRAVENOUS

## 2022-02-20 NOTE — Progress Notes (Signed)
PROGRESS NOTE    William Acevedo  NTI:[id number] DOB: [birth year] DOA: 02/17/2022 PCP: [facility]   Brief Narrative:  HPI: William Acevedo is a 67 y.o. male with medical history significant of  CAD s/p NSTEMI, hyperlipedemia, DMII insulin dependent, cerebral thrombosis with CVA, Essential hypertension, hx of polysubstance abuse , hx of endocarditis, hx of pulmonary cryptococosis, GERD, etoh abuse, Schizoaffectic disorder ,PTSD Who presents to ED with increase lethargy , vomiting decreased intake x 3 days  as well as being nonverbal which is not patient baseline.  Patient currently unable to give history , but is able to follow commands he answers yes to all questions.  ED Course:  Afeb, BP 188/89, HR 103, rr 20  Sat 99% on ra  Na 141, K 4.1, glu 343, cr 1.07  Wbc 6.5, hgb 14.6, plt 235 Lactic acid 2, repeat 1.3 QQP:YPPJK tachycardia , RAE Ua:neg UDS, + cocaine  Assessment & Plan:   Principal Problem:   CVA (cerebral vascular accident) ([place]) Active Problems:   Alcohol use disorder   Polysubstance abuse ([place])   DM2 (diabetes mellitus, type 2) ([place])   Acute encephalopathy   NSTEMI (non-ST elevated myocardial infarction) ([place])   Cocaine abuse ([place])   Schizoaffective disorder ([place])  Acute encephalopathy/acute to early subacute ischemic infarct in medial left frontal lobe correlating with left ACA territory and ACA-PCA watershed/hypertensive emergency: Also has left A2 segment occlusion, corresponding to the infarct along with severe stenosis of proximal cavernous segment of the left ICA.  Also has history of prior CVA.  Patient's expressive aphasia is intermittent, he was able to answer most questions yesterday but today he was again limited to 1 or 2 words.  He appeared confused as well.  He has been started on aspirin and Plavix by neurology and recommendation is to continue DAPT for 3 weeks and then Plavix alone.  Significantly elevated LDL 217 with cholesterol 291,  atorvastatin increased to 80 mg. Recent hemoglobin A1c done in January was 10.6.  Seen by PT OT and they recommended CIR.  Waiting for CIR assessment.   Hypertension: We allowed permissive hypertension initially, now Toprol-XL is the only medication which is resumed and patient's blood pressure is on the low normal side.  Patient is confused as mentioned above.  Will check lactic acid and start him on IV fluids normal saline at 125 cc/h.  Low blood pressure could be due to poor p.o. intake as well.  Type 2 diabetes mellitus: Patient appears to be taking glipizide as well as Lantus 15 units twice daily at home.  He was on 10 units of Lantus.  This morning he was hypoglycemic.  Likely due to poor p.o. intake, his Lantus was reduced to 10 units and he was started on dextrose fluid but blood sugar up to 150 so dextrose is being discontinued.  Encourage p.o. intake.  Continue low-dose long-acting insulin along with SSI.  CAD s/p NSTEMI: Continue home medications.  History of polysubstance abuse: Cocaine positive in UDS.  Also uses EtOH.  GERD: PPI  Schizoaffective disorder/PTSD: Continue home meds.  DVT prophylaxis: heparin injection 5,000 Units Start: 02/18/22 0600 SCD's Start: 02/17/22 2140   Code Status: Full Code  Family Communication:  None present at bedside.   Status is: Inpatient Remains inpatient appropriate because: Medically stable, pending placement.   Estimated body mass index is 16.08 kg/m as calculated from the following:   Height as of this encounter: 5\' 6"  (1.676 m).   Weight as of  this encounter: 45.2 kg.    Nutritional Assessment: Body mass index is 16.08 kg/m.[name] Seen by dietician.  I agree with the assessment and plan as outlined below: Nutrition Status:        . Skin Assessment: I have examined the patient's skin and I agree with the wound assessment as performed by the wound care RN as outlined below:    Consultants:  Neurology  Procedures:   None  Antimicrobials:  Anti-infectives (From admission, onward)    None         Subjective:  Seen and examined.  Alert but partially oriented.  Again with expressive aphasia.  Objective: Vitals:   02/19/22 2028 02/19/22 2337 02/20/22 0330 02/20/22 0747  BP: (!) 156/85 (!) 119/99 93/62 95/66   Pulse: 84 95 77 77  Resp: 18 16 16 16   Temp: 98.5 F (36.9 C) 97.7 F (36.5 C) 97.7 F (36.5 C) 98.5 F (36.9 C)  TempSrc: Oral Oral Oral Oral  SpO2: 100% 98% 100% 100%  Weight:      Height:        Intake/Output Summary (Last 24 hours) at 02/20/2022 1000 Last data filed at 02/20/2022 04/21/2022 Gross per 24 hour  Intake 1560.32 ml  Output 1000 ml  Net 560.32 ml    Filed Weights   02/18/22 0525  Weight: 45.2 kg    Examination:  General exam: Appears calm and comfortable  Respiratory system: Clear to auscultation. Respiratory effort normal. Cardiovascular system: S1 & S2 heard, RRR. No JVD, murmurs, rubs, gallops or clicks. No pedal edema. Gastrointestinal system: Abdomen is nondistended, soft and nontender. No organomegaly or masses felt. Normal bowel sounds heard. Central nervous system: Alert and oriented x 1. No focal neurological deficits. Extremities: Symmetric 5 x 5 power.   Data Reviewed: I have personally reviewed following labs and imaging studies  CBC: Recent Labs  Lab 02/17/22 1513 02/18/22 0652 02/19/22 0248  WBC 6.5 6.7 6.2  NEUTROABS 5.0  --  3.1  HGB 14.6 15.2 13.6  HCT 47.1 49.1 40.3  MCV 89.4 89.9 86.1  PLT 235 227 211    Basic Metabolic Panel: Recent Labs  Lab 02/17/22 1513 02/18/22 0652 02/19/22 0248  NA 141 144 141  K 4.1 4.0 3.4*  CL 105 108 110  CO2 23 19* 23  GLUCOSE 343* 208* 53*  BUN 29* 20 19  CREATININE 1.07 1.02 0.70  CALCIUM 8.7* 9.1 8.5*    GFR: Estimated Creatinine Clearance: 56.5 mL/min (by C-G formula based on SCr of 0.7 mg/dL). Liver Function Tests: Recent Labs  Lab 02/17/22 1513 02/18/22 0652  AST 30 27  ALT  23 21  ALKPHOS 58 62  BILITOT 0.8 0.8  PROT 7.4 7.3  ALBUMIN 3.6 3.7    Recent Labs  Lab 02/17/22 2119  LIPASE 31    Recent Labs  Lab 02/17/22 1514  AMMONIA <10    Coagulation Profile: No results for input(s): "INR", "PROTIME" in the last 168 hours. Cardiac Enzymes: No results for input(s): "CKTOTAL", "CKMB", "CKMBINDEX", "TROPONINI" in the last 168 hours. BNP (last 3 results) No results for input(s): "PROBNP" in the last 8760 hours. HbA1C: No results for input(s): "HGBA1C" in the last 72 hours. CBG: Recent Labs  Lab 02/19/22 1607 02/19/22 2121 02/19/22 2152 02/19/22 2336 02/20/22 0616  GLUCAP 124* 62* 58* 168* 184*    Lipid Profile: Recent Labs    02/18/22 0652  CHOL 291*  HDL 56  LDLCALC 217*  TRIG 88  CHOLHDL 5.2  Thyroid Function Tests: No results for input(s): "TSH", "T4TOTAL", "FREET4", "T3FREE", "THYROIDAB" in the last 72 hours. Anemia Panel: No results for input(s): "VITAMINB12", "FOLATE", "FERRITIN", "TIBC", "IRON", "RETICCTPCT" in the last 72 hours. Sepsis Labs: Recent Labs  Lab 02/17/22 1514 02/17/22 2119  LATICACIDVEN 2.0* 1.3     Recent Results (from the past 240 hour(s))  Resp panel by RT-PCR (RSV, Flu A&B, Covid) Anterior Nasal Swab     Status: None   Collection Time: 02/17/22  3:48 PM   Specimen: Anterior Nasal Swab  Result Value Ref Range Status   SARS Coronavirus 2 by RT PCR NEGATIVE NEGATIVE Final    Comment: (NOTE) SARS-CoV-2 target nucleic acids are NOT DETECTED.  The SARS-CoV-2 RNA is generally detectable in upper respiratory specimens during the acute phase of infection. The lowest concentration of SARS-CoV-2 viral copies this assay can detect is 138 copies/mL. A negative result does not preclude SARS-Cov-2 infection and should not be used as the sole basis for treatment or other patient management decisions. A negative result may occur with  improper specimen collection/handling, submission of specimen other than  nasopharyngeal swab, presence of viral mutation(s) within the areas targeted by this assay, and inadequate number of viral copies(<138 copies/mL). A negative result must be combined with clinical observations, patient history, and epidemiological information. The expected result is Negative.  Fact Sheet for Patients:  BloggerCourse.com  Fact Sheet for Healthcare Providers:  SeriousBroker.it  This test is no t yet approved or cleared by the [place] FDA and  has been authorized for detection and/or diagnosis of SARS-CoV-2 by FDA under an Emergency Use Authorization (EUA). This EUA will remain  in effect (meaning this test can be used) for the duration of the COVID-19 declaration under Section 564(b)(1) of the Act, 21 U.S.C.section 360bbb-3(b)(1), unless the authorization is terminated  or revoked sooner.       Influenza A by PCR NEGATIVE NEGATIVE Final   Influenza B by PCR NEGATIVE NEGATIVE Final    Comment: (NOTE) The Xpert Xpress SARS-CoV-2/FLU/RSV plus assay is intended as an aid in the diagnosis of influenza from Nasopharyngeal swab specimens and should not be used as a sole basis for treatment. Nasal washings and aspirates are unacceptable for Xpert Xpress SARS-CoV-2/FLU/RSV testing.  Fact Sheet for Patients: BloggerCourse.com  Fact Sheet for Healthcare Providers: SeriousBroker.it  This test is not yet approved or cleared by the [place] FDA and has been authorized for detection and/or diagnosis of SARS-CoV-2 by FDA under an Emergency Use Authorization (EUA). This EUA will remain in effect (meaning this test can be used) for the duration of the COVID-19 declaration under Section 564(b)(1) of the Act, 21 U.S.C. section 360bbb-3(b)(1), unless the authorization is terminated or revoked.     Resp Syncytial Virus by PCR NEGATIVE NEGATIVE Final    Comment:  (NOTE) Fact Sheet for Patients: BloggerCourse.com  Fact Sheet for Healthcare Providers: SeriousBroker.it  This test is not yet approved or cleared by the [place] FDA and has been authorized for detection and/or diagnosis of SARS-CoV-2 by FDA under an Emergency Use Authorization (EUA). This EUA will remain in effect (meaning this test can be used) for the duration of the COVID-19 declaration under Section 564(b)(1) of the Act, 21 U.S.C. section 360bbb-3(b)(1), unless the authorization is terminated or revoked.  Performed at [facility], [address] [place], [place] [address]      Radiology Studies: ECHOCARDIOGRAM LIMITED BUBBLE STUDY  Result Date: 02/18/2022    ECHOCARDIOGRAM LIMITED REPORT  Patient Name:   William Acevedo Date of Exam: 02/18/2022 Medical Rec #:  [id number]           Height:       66.0 in Accession #:    [id number]          Weight:       99.6 lb Date of Birth:  08-08-1955           BSA:          1.487 m Patient Age:    14 years            BP:           175/91 mmHg Patient Gender: M                   HR:           80 bpm. Exam Location:  Inpatient Procedure: Limited Echo, Color Doppler, Cardiac Doppler and Saline Contrast            Bubble Study Indications:    stroke  History:        Patient has prior history of Echocardiogram examinations, most                 recent 07/10/2020. CAD and Previous Myocardial Infarction,                 Stroke; Risk Factors:Diabetes.  Sonographer:    Harvie Junior Referring Phys: [id number] SARA-MAIZ A THOMAS  Sonographer Comments: Technically difficult study due to poor echo windows. IMPRESSIONS  1. Left ventricular ejection fraction, by estimation, is 55 to 60%. The left ventricle has normal function. The left ventricle has no regional wall motion abnormalities. There is mild asymmetric left ventricular hypertrophy of the basal and septal segments. Left ventricular  diastolic parameters are consistent with Grade I diastolic dysfunction (impaired relaxation). Elevated left ventricular end-diastolic pressure.  2. Right ventricular systolic function is normal. The right ventricular size is normal.  3. The mitral valve is abnormal. Trivial mitral valve regurgitation. No evidence of mitral stenosis. Severe mitral annular calcification.  4. The aortic valve is tricuspid. There is moderate calcification of the aortic valve. There is moderate thickening of the aortic valve. Aortic valve regurgitation is not visualized. Aortic valve sclerosis/calcification is present, without any evidence of aortic stenosis.  5. The inferior vena cava is normal in size with greater than 50% respiratory variability, suggesting right atrial pressure of 3 mmHg. FINDINGS  Left Ventricle: Left ventricular ejection fraction, by estimation, is 55 to 60%. The left ventricle has normal function. The left ventricle has no regional wall motion abnormalities. The left ventricular internal cavity size was normal in size. There is  mild asymmetric left ventricular hypertrophy of the basal and septal segments. Left ventricular diastolic parameters are consistent with Grade I diastolic dysfunction (impaired relaxation). Elevated left ventricular end-diastolic pressure. Right Ventricle: The right ventricular size is normal. No increase in right ventricular wall thickness. Right ventricular systolic function is normal. Left Atrium: Left atrial size was normal in size. Right Atrium: Right atrial size was normal in size. Pericardium: There is no evidence of pericardial effusion. Mitral Valve: The mitral valve is abnormal. There is moderate thickening of the mitral valve leaflet(s). There is moderate calcification of the mitral valve leaflet(s). Severe mitral annular calcification. Trivial mitral valve regurgitation. No evidence of mitral valve stenosis. Tricuspid Valve: The tricuspid valve is normal in structure. Tricuspid  valve regurgitation is not demonstrated. No evidence of tricuspid  stenosis. Aortic Valve: The aortic valve is tricuspid. There is moderate calcification of the aortic valve. There is moderate thickening of the aortic valve. Aortic valve regurgitation is not visualized. Aortic valve sclerosis/calcification is present, without any  evidence of aortic stenosis. Aortic valve mean gradient measures 1.0 mmHg. Aortic valve peak gradient measures 2.6 mmHg. Aortic valve area, by VTI measures 3.01 cm. Pulmonic Valve: The pulmonic valve was normal in structure. Pulmonic valve regurgitation is not visualized. No evidence of pulmonic stenosis. Aorta: The aortic root is normal in size and structure. Venous: The inferior vena cava is normal in size with greater than 50% respiratory variability, suggesting right atrial pressure of 3 mmHg. IAS/Shunts: The interatrial septum appears to be lipomatous. No atrial level shunt detected by color flow Doppler. Agitated saline contrast was given intravenously to evaluate for intracardiac shunting. LEFT VENTRICLE PLAX 2D LVIDd:         3.30 cm     Diastology LVIDs:         2.40 cm     LV e' medial:    5.66 cm/s LV PW:         1.20 cm     LV E/e' medial:  19.6 LV IVS:        1.20 cm     LV e' lateral:   6.20 cm/s LVOT diam:     1.80 cm     LV E/e' lateral: 17.9 LV SV:         54 LV SV Index:   36 LVOT Area:     2.54 cm  LV Volumes (MOD) LV vol d, MOD A2C: 64.6 ml LV vol d, MOD A4C: 64.3 ml LV vol s, MOD A2C: 26.2 ml LV vol s, MOD A4C: 25.7 ml LV SV MOD A2C:     38.4 ml LV SV MOD A4C:     64.3 ml LV SV MOD BP:      38.3 ml LEFT ATRIUM         Index LA diam:    2.70 cm 1.82 cm/m  AORTIC VALVE                    PULMONIC VALVE AV Area (Vmax):    2.84 cm     PV Vmax:       1.00 m/s AV Area (Vmean):   3.17 cm     PV Peak grad:  4.0 mmHg AV Area (VTI):     3.01 cm AV Vmax:           81.30 cm/s AV Vmean:          54.200 cm/s AV VTI:            0.180 m AV Peak Grad:      2.6 mmHg AV Mean Grad:       1.0 mmHg LVOT Vmax:         90.70 cm/s LVOT Vmean:        67.500 cm/s LVOT VTI:          0.213 m LVOT/AV VTI ratio: 1.18  AORTA Ao Root diam: 3.00 cm MITRAL VALVE MV Area (PHT): 3.97 cm     SHUNTS MV Decel Time: 191 msec     Systemic VTI:  0.21 m MV E velocity: 111.00 cm/s  Systemic Diam: 1.80 cm MV A velocity: 157.00 cm/s MV E/A ratio:  0.71 Jenkins Rouge MD Electronically signed by Jenkins Rouge MD Signature Date/Time: 02/18/2022/3:25:53 PM    Final  Scheduled Meds:  aspirin EC  81 mg Oral Daily   atorvastatin  80 mg Oral QHS   clopidogrel  75 mg Oral Daily   dapagliflozin propanediol  10 mg Oral Daily   DULoxetine  60 mg Oral Daily   famotidine  20 mg Oral QAC breakfast   folic acid  1 mg Oral Daily   gabapentin  300 mg Oral BID   heparin  5,000 Units Subcutaneous Q8H   insulin aspart  0-9 Units Subcutaneous TID WC   insulin glargine-yfgn  10 Units Subcutaneous Daily   metoprolol succinate  25 mg Oral Daily   OLANZapine  5 mg Oral QHS   pantoprazole  40 mg Oral Daily   traZODone  100 mg Oral QHS   Continuous Infusions:     LOS: 3 days   Darliss Cheney, MD [facility]  02/20/2022, 10:00 AM   *Please note that this is a verbal dictation therefore any spelling or grammatical errors are due to the "[place] [employer]" system interpretation.  Please page via [place] and do not message via secure chat for urgent patient care matters. Secure chat can be used for non urgent patient care matters.  How to contact the [facility] Attending or Consulting provider [place] or covering provider during after hours [place], for this patient?  Check the care team in [facility] and look for a) attending/consulting [facility] provider listed and b) the [facility] team listed. Page or secure chat [phone]. Log into [email or web address] and use [place]'s universal password to access. If you do not have the password, please contact the hospital operator. Locate the [facility] provider you are looking for under [facility] and page  to a number that you can be directly reached. If you still have difficulty reaching the provider, please page the [facility] (Director on Call) for the Hospitalists listed on amion for assistance.

## 2022-02-21 DIAGNOSIS — I639 Cerebral infarction, unspecified: Secondary | ICD-10-CM | POA: Diagnosis not present

## 2022-02-21 LAB — BASIC METABOLIC PANEL
Anion gap: 13 (ref 5–15)
Anion gap: 8 (ref 5–15)
BUN: 28 mg/dL — ABNORMAL HIGH (ref 8–23)
BUN: 22 mg/dL (ref 8–23)
CO2: 18 mmol/L — ABNORMAL LOW (ref 22–32)
CO2: 22 mmol/L (ref 22–32)
Calcium: 8.3 mg/dL — ABNORMAL LOW (ref 8.9–10.3)
Calcium: 8.3 mg/dL — ABNORMAL LOW (ref 8.9–10.3)
Chloride: 107 mmol/L (ref 98–111)
Chloride: 106 mmol/L (ref 98–111)
Creatinine, Ser: 1.42 mg/dL — ABNORMAL HIGH (ref 0.61–1.24)
Creatinine, Ser: 0.89 mg/dL (ref 0.61–1.24)
GFR, Estimated: 54 mL/min — ABNORMAL LOW (ref >60)
GFR, Estimated: >60 mL/min (ref >60)
Glucose, Bld: 214 mg/dL — ABNORMAL HIGH (ref 70–99)
Glucose, Bld: 173 mg/dL — ABNORMAL HIGH (ref 70–99)
Potassium: 5.7 mmol/L — ABNORMAL HIGH (ref 3.5–5.1)
Potassium: 4.1 mmol/L (ref 3.5–5.1)
Sodium: 138 mmol/L (ref 135–145)
Sodium: 136 mmol/L (ref 135–145)

## 2022-02-21 LAB — GLUCOSE, CAPILLARY
Glucose-Capillary: 231 mg/dL — ABNORMAL HIGH (ref 70–99)
Glucose-Capillary: 186 mg/dL — ABNORMAL HIGH (ref 70–99)
Glucose-Capillary: 107 mg/dL — ABNORMAL HIGH (ref 70–99)
Glucose-Capillary: 178 mg/dL — ABNORMAL HIGH (ref 70–99)

## 2022-02-21 LAB — MAGNESIUM: Magnesium: 2.2 mg/dL (ref 1.7–2.4)

## 2022-02-21 NOTE — Progress Notes (Signed)
Occupational Therapy Treatment Patient Details Name: William Acevedo MRN: [id number] DOB: 04/02/1955 Today's Date: 02/21/2022   History of present illness Pt is a 67 y.o. M who presents 02/17/2022 with increased lethargy, vomiting, decreased intake x 3 days. MRI with 1. Acute to early subacute infarct in the medial left frontal lobe,  correlating with the left ACA territory and ACA-PCA watershed. No  evidence of hemorrhagic transformation or significant mass effect. 2. Remote infarcts involving the bilateral occipital cortex, right  parietal cortex, and posterior left frontal lobe white matter. PMH includes: HTN, HLD, CVA, uncontrolled DM II, renal infarct, major depressive disorder, polysubstance abuse (cocaine and marijuana), multiple recent admissions for abdominal pain, shingles, and multifocal acute infarcts.   OT comments  Pt making very slow progress with OT. Pt appeared slightly more lethargic this session with eyes closed at least 1/2 of session requiring mod assist to stand and groom at the sink. Pt could not problem solve to move toothbrush from R hand to L when R hand not functioning as it should. Pt bed soaked on arrival and this seems to be the norm. Nursing states pt will not use condom cath, purewick or urinal. Spoke with pt about agreeing to condom cath.  Pt with short shuffled steps walking to sink today with mod assist. Feel AIR is necessary for this pt to get consistent therapy and activity so he can eventually d/c  home.  Will continue to follow and hopefully assess vision when pt more able.    Recommendations for follow up therapy are one component of a multi-disciplinary discharge planning process, led by the attending physician.  Recommendations may be updated based on patient status, additional functional criteria and insurance authorization.    Follow Up Recommendations  Acute inpatient rehab (3hours/day)     Assistance Recommended at Discharge Frequent or constant  Supervision/Assistance  Patient can return home with the following  A lot of help with walking and/or transfers;A little help with bathing/dressing/bathroom;Assistance with cooking/housework;Direct supervision/assist for medications management;Direct supervision/assist for financial management;Assist for transportation;Help with stairs or ramp for entrance   Equipment Recommendations       Recommendations for Other Services      Precautions / Restrictions Precautions Precautions: Fall Restrictions Weight Bearing Restrictions: No       Mobility Bed Mobility Overal bed mobility: Needs Assistance Bed Mobility: Supine to Sit, Sit to Supine     Supine to sit: Mod assist Sit to supine: Min assist   General bed mobility comments: Assist for trunk to sit up on edge of bed, significantly increased time. MinA for RLE negotiation back into bed    Transfers Overall transfer level: Needs assistance Equipment used: 1 person hand held assist Transfers: Sit to/from Stand Sit to Stand: Min assist           General transfer comment: MinA to rise and steady     Balance Overall balance assessment: Needs assistance Sitting-balance support: Feet supported Sitting balance-Leahy Scale: Fair     Standing balance support: No upper extremity supported, During functional activity Standing balance-Leahy Scale: Poor Standing balance comment: up to modA                           ADL either performed or assessed with clinical judgement   ADL Overall ADL's : Needs assistance/impaired   Eating/Feeding Details (indicate cue type and reason): did not assess today but feel needs to be looked at for pocketing and making  sure pt can see all items on his tray to the right. Grooming: Wash/dry hands;Wash/dry face;Oral care;Moderate assistance;Standing Grooming Details (indicate cue type and reason): Pt washed face when given wash cloth and given step by step cues on where to find soap.  Pt  required mod assist to brush teeth attempting to brush with  R and but could not problem solve to switch to L hand due to weakness. Attempted hand over hand assist with RUE and pt not holding to toothbrush at all.  Pt did use LUE when toothbrush was actively placed in that hand for him. Cues given to open eyes if possible.             Lower Body Dressing: Moderate assistance;Sit to/from stand;Cueing for sequencing;Cueing for compensatory techniques Lower Body Dressing Details (indicate cue type and reason): Pt unable to donn socks on EOB without mod assist. Pt with poor sitting balance and required assist to turn to get leg on bed to attempt to donn them.             Functional mobility during ADLs: Moderate assistance General ADL Comments: hand held assist walking to sink while pt had L hand on IV pole.  Pt very unsteady on his feet when walking to sink taking very small steps.    Extremity/Trunk Assessment Upper Extremity Assessment Upper Extremity Assessment: RUE deficits/detail RUE Deficits / Details: Shoulder 3/5, biceps/triceps 4-/5, grip 4-/5 RUE Sensation: decreased light touch RUE Coordination: decreased fine motor;decreased gross motor   Lower Extremity Assessment Lower Extremity Assessment: Defer to PT evaluation        Vision   Additional Comments: Difficult to assess due to pt keeping eyes closed all the time. pt found items on his R and L when asked to find them at the sink. Pt neglects R side unless cued to look there so feel it is more perceptual.  Unable to complete full vision assessment.   Perception     Praxis      Cognition Arousal/Alertness: Lethargic Behavior During Therapy: Flat affect Overall Cognitive Status: Impaired/Different from baseline Area of Impairment: Orientation, Attention, Memory, Following commands, Safety/judgement, Awareness, Problem solving                 Orientation Level: Disoriented to, Time, Situation Current Attention  Level: Focused Memory: Decreased short-term memory Following Commands: Follows one step commands consistently Safety/Judgement: Decreased awareness of safety, Decreased awareness of deficits Awareness: Intellectual Problem Solving: Slow processing, Decreased initiation, Requires verbal cues General Comments: Pt was more alert this am with PT but now is more lethargic. Pt sitting in urine on arrival. pt is not agreeable (per nursine) to purewick, condom cath or using urinal so bed in consistently wet.  Spoke with pt about skin and need to stay dry so pt agreed to condom cath.        Exercises      Shoulder Instructions       General Comments Pt very limited due to R side weakess, inattn, cognitive deficits and lethargy.    Pertinent Vitals/ Pain       Pain Assessment Pain Assessment: No/denies pain Faces Pain Scale: No hurt  Home Living                                          Prior Functioning/Environment  Frequency  Min 2X/week        Progress Toward Goals  OT Goals(current goals can now be found in the care plan section)  Progress towards OT goals: Not progressing toward goals - comment (pt more lethargic today but did ok in PT just before OT session. Possibly fatigued)  Acute Rehab OT Goals Patient Stated Goal: none stated OT Goal Formulation: With patient Time For Goal Achievement: 03/04/22 Potential to Achieve Goals: Good ADL Goals Pt Will Perform Grooming: with modified independence;standing Pt Will Perform Lower Body Dressing: with modified independence;sit to/from stand Pt Will Transfer to Toilet: with modified independence;ambulating;regular height toilet Additional ADL Goal #1: Pt will follow three step commands to optimize independence in ADL and IADL.  Plan Discharge plan remains appropriate    Co-evaluation                 AM-PAC OT "6 Clicks" Daily Activity     Outcome Measure   Help from another person  eating meals?: A Little Help from another person taking care of personal grooming?: A Lot Help from another person toileting, which includes using toliet, bedpan, or urinal?: A Lot Help from another person bathing (including washing, rinsing, drying)?: A Lot Help from another person to put on and taking off regular upper body clothing?: A Little Help from another person to put on and taking off regular lower body clothing?: A Lot 6 Click Score: 14    End of Session    OT Visit Diagnosis: Unsteadiness on feet (R26.81);Other abnormalities of gait and mobility (R26.89);Muscle weakness (generalized) (M62.81);Low vision, both eyes (H54.2)   Activity Tolerance Patient limited by lethargy   Patient Left in bed;with call bell/phone within reach;with bed alarm set;with nursing/sitter in room   Nurse Communication Mobility status        Time: 1243-1310 OT Time Calculation (min): 27 min  Charges: OT General Charges $OT Visit: 1 Visit OT Treatments $Self Care/Home Management : 8-22 mins   Glenford Peers 02/21/2022, 1:24 PM

## 2022-02-21 NOTE — TOC Progression Note (Signed)
Transition of Care [facility]) - Progression Note    Patient Details  Name: William Acevedo MRN: [id number] Date of Birth: 23-Feb-1955  [facility] CM/SW Contact  Pollie Friar, RN Phone Number: 02/21/2022, 3:06 PM  Clinical Narrative:    CIR is starting auth for an admission. TOC following.   Expected Discharge Plan: IP Rehab Facility Barriers to Discharge: Continued Medical Work up  Expected Discharge Plan and Services                                               Social Determinants of Health (SDOH) Interventions SDOH Screenings   Tobacco Use: High Risk (02/17/2022)    Readmission Risk Interventions    04/19/2020    2:34 PM  Readmission Risk Prevention Plan  Transportation Screening Complete  Medication Review ([place]) Complete  PCP or Specialist appointment within 3-5 days of discharge Complete  HRI or [place] Complete  SW Recovery Care/Counseling Consult Complete  [place] Not Applicable

## 2022-02-21 NOTE — PMR Pre-admission (Signed)
PMR Admission Coordinator Pre-Admission Assessment  Patient: William Acevedo is an 67 y.o., male MRN: [id number] DOB: 01/07/1956 Height: 5\' 6"  (1.676 m) Weight: 45.2 kg (per RN Amy)  Insurance Information HMO:     PPO:      PCP:      IPA:      80/20:      OTHER:  PRIMARY: [facility]      Policy#: [id number]       Subscriber: Pt CM Name: Steva Ready      Phone#:  [phone]. [phone]     Fax#:   [phone] Pre-Cert#: [id number]      Employer:  Received an email from Steva Ready 2/8 stating Pt. Approved for 30 days, Please contact her with date and time of admit and she will give the auth number and dates Benefits:  Phone #:      Name:  Eff. Date:  02/21/22 active     Deduct:       Out of Pocket Max:       Life Max:  CIR:  100% if approved     SNF:  Outpatient:      Co-Pay:  Home Health:       Co-Pay:  DME:      Co-Pay:  Providers: In network   SECONDARY: Medicaid [place] Access      Policy#: [id number]       Phone#:   Financial Counselor:       Phone#:   The "Data Collection Information Summary" for patients in Inpatient Rehabilitation Facilities with attached "Privacy Act [place] Records" was provided and verbally reviewed with: Family  Emergency Contact Information Contact Information     Name Relation Home Work Mobile   [place] Brother [phone]  [phone]       Current Medical History  Patient Admitting Diagnosis: L CVA   History of Present Illness: DEWAN EMOND is a 67 y.o. male with medical history significant of CAD s/p NSTEMI, hyperlipedemia, DMII insulin dependent, cerebral thrombosis with CVA, Essential hypertension, polysubstance abuse,  endocarditis, pulmonary cryptococosis, GERD, etoh abuse, Schizoaffectic disorder  and PTSD who presents to [facility] ED 02/17/22 with increased lethargy vomiting,  decreased intake x 3 days  and difficulty communicating.  In the ED Pt. Vitals and chemistries as follows:  Afebrile, BP 188/89, HR 103, rr 20, saturating at  99% on RA;  Na 141, K 4.1, glu 343, cr 1.07,  Wbc 6.5, hgb 14.6, plt 235, Lactic acid 2, repeat 1.3.  BTD:VVOHY tachycardia,  UA negative, UDS positive for  cocaine. CXR clear. CTH with Extensive, multifocal bilateral encephalomalacia, some of which appears to be new or at least increased compared to prior examination dated 07/09/2020 although is generally age indeterminate. MRI of the head showed  cute to early subacute infarct in the medial left frontal lobe, correlating with the left ACA territory and ACA-PCA watershed. No evidence of hemorrhagic transformation or significant mass effect as well as remote infarcts involving the bilateral occipital cortex, right parietal cortex, and posterior left frontal lobe white matter. CTAB with no  acute findings in the abdomen pelvis, bilateral nonobstructing renal calculi and aortic atherosclerosis.CTA with  left A2 segment occlusion, corresponding to the infarct seen on concomitant brain MRI and severe stenosis of the proximal cavernous segment of the left ICA. Pt. Seen by PT/OT/SLP and they recommend CIR to assist return to PLOF.    Complete NIHSS TOTAL: 5  Patient's medical record from [facility] has been reviewed by the rehabilitation admission coordinator and physician.  Past Medical History  Past Medical History:  Diagnosis Date   Carpal tunnel syndrome    Depression    Diabetes mellitus without complication ([place])    Hypertensive urgency 04/14/2020   Neuropathy     Has the patient had major surgery during 100 days prior to admission? No  Family History   family history is not on file.  Current Medications  Current Facility-Administered Medications:    acetaminophen (TYLENOL) tablet 650 mg, 650 mg, Oral, Q4H PRN **OR** acetaminophen (TYLENOL) 160 MG/5ML solution 650 mg, 650 mg, Per Tube, Q4H PRN **OR** acetaminophen (TYLENOL) suppository 650 mg, 650 mg, Rectal, Q4H PRN, Clance Boll, MD   aspirin EC tablet 81 mg, 81 mg, Oral, Daily, Myles Rosenthal A, MD, 81 mg at 02/24/22 2376   atorvastatin (LIPITOR) tablet 80 mg, 80 mg, Oral, QHS, Lehner, Erin C, NP, 80 mg at 02/23/22 2133   clopidogrel (PLAVIX) tablet 75 mg, 75 mg, Oral, Daily, Myles Rosenthal A, MD, 75 mg at 02/24/22 2831   dapagliflozin propanediol (FARXIGA) tablet 10 mg, 10 mg, Oral, Daily, Pahwani, Ravi, MD, 10 mg at 02/24/22 5176   DULoxetine (CYMBALTA) DR capsule 60 mg, 60 mg, Oral, Daily, Pahwani, Ravi, MD, 60 mg at 02/24/22 0832   famotidine (PEPCID) tablet 20 mg, 20 mg, Oral, QAC breakfast, Pahwani, Ravi, MD, 20 mg at 02/24/22 0751   feeding supplement (NEPRO CARB STEADY) liquid 237 mL, 237 mL, Oral, TID WC, Pahwani, Ravi, MD, 237 mL at 16/07/37 1062   folic acid (FOLVITE) tablet 1 mg, 1 mg, Oral, Daily, Pahwani, Ravi, MD, 1 mg at 02/24/22 6948   gabapentin (NEURONTIN) capsule 300 mg, 300 mg, Oral, BID, Pahwani, Ravi, MD, 300 mg at 02/24/22 0832   heparin injection 5,000 Units, 5,000 Units, Subcutaneous, Q8H, Myles Rosenthal A, MD, 5,000 Units at 02/24/22 1408   hydrALAZINE (APRESOLINE) injection 10 mg, 10 mg, Intravenous, Q4H PRN, Pahwani, Ravi, MD, 10 mg at 02/18/22 1629   insulin aspart (novoLOG) injection 0-9 Units, 0-9 Units, Subcutaneous, TID WC, Myles Rosenthal A, MD, 5 Units at 02/24/22 1149   insulin glargine-yfgn (SEMGLEE) injection 15 Units, 15 Units, Subcutaneous, BID, Alekh, Kshitiz, MD, 15 Units at 02/24/22 0851   melatonin tablet 6 mg, 6 mg, Oral, QHS PRN, Darliss Cheney, MD   metoprolol succinate (TOPROL-XL) 24 hr tablet 25 mg, 25 mg, Oral, Daily, Pahwani, Ravi, MD, 25 mg at 02/24/22 5462   multivitamin with minerals tablet 1 tablet, 1 tablet, Oral, Daily, Pahwani, Ravi, MD, 1 tablet at 02/24/22 0832   OLANZapine (ZYPREXA) tablet 5 mg, 5 mg, Oral, QHS, Pahwani, Ravi, MD, 5 mg at 02/23/22 2133   Oral care mouth rinse, 15 mL, Mouth Rinse, PRN, Pahwani, Ravi, MD   pantoprazole  (PROTONIX) EC tablet 40 mg, 40 mg, Oral, Daily, Hammons, Kimberly B, RPH, 40 mg at 02/24/22 7035   senna-docusate (Senokot-S) tablet 1 tablet, 1 tablet, Oral, QHS PRN, Clance Boll, MD, 1 tablet at 02/22/22 2102   traZODone (DESYREL) tablet 100 mg, 100 mg, Oral, QHS, Pahwani, Ravi, MD, 100 mg at 02/23/22 2133  Patients Current Diet:  Diet Order             Diet - low sodium heart healthy           Diet Carb Modified           Diet Carb Modified Fluid consistency: Thin; Room service appropriate? Yes  Diet effective now                   Precautions / Restrictions Precautions Precautions: Fall Precaution Comments: wrist restraints Restrictions Weight Bearing Restrictions: No   Has the patient had 2 or more falls or a fall with injury in the past year? Yes  Prior Activity Level Community (5-7x/wk): Pt went out 2+ a week,for appts and errands  Prior Functional Level Self Care: Did the patient need help bathing, dressing, using the toilet or eating? Needed some help  Indoor Mobility: Did the patient need assistance with walking from room to room (with or without device)? Independent  Stairs: Did the patient need assistance with internal or external stairs (with or without device)? Needed some help  Functional Cognition: Did the patient need help planning regular tasks such as shopping or remembering to take medications? Needed some help  Patient Information Are you of Hispanic, Latino/a,or Spanish origin?: X. Patient unable to respond (proxy) What is your race?: B. Black or African American Do you need or want an interpreter to communicate with a doctor or health care staff?: 0. No  Patient's Response To:  Health Literacy and Transportation Is the patient able to respond to health literacy and transportation needs?: No Health Literacy - How often do you need to have someone help you when you read instructions, pamphlets, or other written material from your doctor  or pharmacy?: Patient unable to respond In the past 12 months, has lack of transportation kept you from medical appointments or from getting medications?: No (proxy) In the past 12 months, has lack of transportation kept you from meetings, work, or from getting things needed for daily living?: No  Journalist, newspaper / Equipment Home Equipment: BSC/3in1, Information systems manager, Medical laboratory scientific officer - single point, Crutches, Wheelchair - manual  Prior Device Use: Indicate devices/aids used by the patient prior to current illness, exacerbation or injury? Walker  Current Functional Level Cognition  Overall Cognitive Status: No family/caregiver present to determine baseline cognitive functioning Difficult to assess due to: Impaired communication Current Attention Level: Sustained Orientation Level: Oriented to person, Oriented to place, Disoriented to time, Disoriented to situation Following Commands: Follows one step commands inconsistently Safety/Judgement: Decreased awareness of safety, Decreased awareness of deficits General Comments: Pt with slow processing during functional mobility    Extremity Assessment (includes Sensation/Coordination)  Upper Extremity Assessment: RUE deficits/detail RUE Deficits / Details: Shoulder 3/5, biceps/triceps 4-/5, grip 4-/5 RUE Sensation: decreased light touch RUE Coordination: decreased fine motor, decreased gross motor  Lower Extremity Assessment: Defer to PT evaluation RLE Deficits / Details: ROM WFL LLE Deficits / Details: ROM WFL    ADLs  Overall ADL's : Needs assistance/impaired Eating/Feeding: Supervision/ safety, Sitting, Set up Eating/Feeding Details (indicate cue type and reason): Pt taking large bites requiring cues to slow down.  Pt used RUE to feed self oatmeal but would often "lose" his R hand under the table that he was eating off of and require cues to put the R hand back on top of table near tray so he would remember to use it to feed self. Grooming:  Wash/dry hands, Minimal assistance, Standing Grooming Details (indicate cue type and reason): Pt stood at sink to wash hands with slight posterior lean that pt as able to assist in recovering from.  Pt's eyes open and pt engaging in task using BUE equally this session. Upper Body Bathing: Set up, Sitting Lower Body Bathing: Minimal assistance, Sit to/from stand Upper Body Dressing : Set  up, Sitting Lower Body Dressing: Moderate assistance, Sit to/from stand, Cueing for sequencing, Cueing for compensatory techniques Lower Body Dressing Details (indicate cue type and reason): Pt unable to donn socks on EOB without mod assist. Pt with poor sitting balance and required assist to turn to get leg on bed to attempt to donn them. Toilet Transfer: Minimal assistance, Ambulation Toilet Transfer Details (indicate cue type and reason): 1 person hand held assist Toileting- Clothing Manipulation and Hygiene: Min guard, Sit to/from stand, Cueing for sequencing Toileting - Clothing Manipulation Details (indicate cue type and reason): Pt cleaning self up at toilet. Walking to sink and perforing grooming tasks. Then lifting gown and urinating on floor standing by sink. Appeared to be behavioral in nature Functional mobility during ADLs: Moderate assistance General ADL Comments: hand held assist walking to sink while pt had L hand on IV pole.  Pt very unsteady on his feet when walking to sink taking very small steps.    Mobility  Overal bed mobility: Needs Assistance Bed Mobility: Supine to Sit Supine to sit: Min assist Sit to supine: Min assist General bed mobility comments: Pt requires Min A for bil LE for sitting to supine. Pt requires Min verbal cues for sequencing and initiation for going for supine to sitting EOB with significant increase in time.    Transfers  Overall transfer level: Needs assistance Equipment used: Rolling walker (2 wheels) Transfers: Sit to/from Stand Sit to Stand: Min guard General  transfer comment: Verbal cues for sit to stand for safe hand placement    Ambulation / Gait / Stairs / Wheelchair Mobility  Ambulation/Gait Ambulation/Gait assistance: Mod assist Gait Distance (Feet): 20 Feet Assistive device: Rolling walker (2 wheels) Gait Pattern/deviations: Step-to pattern, Decreased stance time - right, Decreased weight shift to right, Decreased step length - right, Decreased dorsiflexion - right General Gait Details: Pt requires moderate verbal cues for correct sequencing with AD and with stepping including taking a large enough step with the RLE. Pt had 1x LOB requiring Mod A to prevent fall posterior to the R. When asked what happened pt stated " You almost let me fall." Pt has difficulty progressing the RLE which results in trailing. Gait velocity: Decreased cadence. Gait velocity interpretation: <1.31 ft/sec, indicative of household ambulator Stairs:  (not performed today due to current functional status.)    Posture / Balance Balance Overall balance assessment: Needs assistance Sitting-balance support: Feet supported Sitting balance-Leahy Scale: Fair Standing balance support: During functional activity, Bilateral upper extremity supported Standing balance-Leahy Scale: Poor Standing balance comment: up to modA with LOB posterior and R.    Special needs/care consideration Special service needs none   Previous Home Environment (from acute therapy documentation) Living Arrangements: Other relatives (brother) Available Help at Discharge: Family Type of Home: House Home Layout: One level Home Access: [place] entrance [place]: Handicapped height Additional Comments: Information taken from 2022; unsure if still accurate. Recent IVC admission and cleared by psych  Discharge Living Setting Plans for Discharge Living Setting: Patient's home Type of Home at Discharge: House Discharge Home Layout: One level Discharge Home Access: Ramped entrance Discharge  Bathroom Shower/Tub: Tub/shower unit Discharge Bathroom Toilet: Handicapped height Discharge Bathroom Accessibility: Yes How Accessible: Accessible via walker, Accessible via wheelchair  [place] Patient Roles: Other (Comment) Contact Information: [phone] Anticipated Caregiver: Jamesandrew Kernaghan (brother) can provide 24/7 assist with no limitations Ability/Limitations of Caregiver: None Caregiver Availability: 24/7 Discharge Plan Discussed with Primary Caregiver: Yes Is Caregiver In Agreement with Plan?: Yes Does  Caregiver/Family have Issues with Lodging/Transportation while Pt is in Rehab?: No  Goals Patient/Family Goal for Rehab: PT/OT Min A-Supervision, SLP Min A-Mod A Expected length of stay: 12-14 days Pt/Family Agrees to Admission and willing to participate: Yes Program Orientation Provided & Reviewed with Pt/Caregiver Including Roles  & Responsibilities: Yes  Decrease burden of Care through IP rehab admission: none  Possible need for SNF placement upon discharge: not anticipated   Patient Condition: I have reviewed medical records from [facility] , spoken with CM, and patient and family member. I met with patient at the bedside for inpatient rehabilitation assessment.  Patient will benefit from ongoing PT, OT, and SLP, can actively participate in 3 hours of therapy a day 5 days of the week, and can make measurable gains during the admission.  Patient will also benefit from the coordinated team approach during an Inpatient Acute Rehabilitation admission.  The patient will receive intensive therapy as well as Rehabilitation physician, nursing, social worker, and care management interventions.  Due to bladder management, bowel management, safety, skin/wound care, disease management, medication administration, pain management, and patient education the patient requires 24 hour a day rehabilitation nursing.  The patient is currently min to mod  assist with mobility and basic ADLs.  Discharge setting and therapy post discharge at home with home health is anticipated.  Patient has agreed to participate in the Acute Inpatient Rehabilitation Program and will admit today.  Preadmission Screen Completed By:  Retta Diones, 02/24/2022 3:51 PM ______________________________________________________________________   Discussed status with Dr. Dagoberto Ligas on 02/24/22 at 1500 and received approval for admission today.  Admission Coordinator:  Retta Diones, RN, time 1550/Date 02/24/22   Assessment/Plan: Diagnosis: Does the need for close, 24 hr/day Medical supervision in concert with the patient's rehab needs make it unreasonable for this patient to be served in a less intensive setting? Yes Co-Morbidities requiring supervision/potential complications: uncontrolled IDDM, CAD s/p NSTEMI; schizoaffective d/o; polysubstance abuse; expressive aphasia, new L ACA/PCA strokes; PTSD, prior CVA Due to bladder management, bowel management, safety, skin/wound care, disease management, medication administration, and patient education, does the patient require 24 hr/day rehab nursing? Yes Does the patient require coordinated care of a physician, rehab nurse, PT, OT, and SLP to address physical and functional deficits in the context of the above medical diagnosis(es)? Yes Addressing deficits in the following areas: balance, endurance, locomotion, strength, transferring, bowel/bladder control, bathing, dressing, feeding, grooming, toileting, cognition, speech, language, and swallowing Can the patient actively participate in an intensive therapy program of at least 3 hrs of therapy 5 days a week? Yes The potential for patient to make measurable gains while on inpatient rehab is good and fair Anticipated functional outcomes upon discharge from inpatient rehab: supervision and min assist PT, supervision and min assist OT, min assist and mod assist SLP Estimated rehab length  of stay to reach the above functional goals is: 12-16 days Anticipated discharge destination: Home 10. Overall Rehab/Functional Prognosis: good and fair   MD Signature:

## 2022-02-21 NOTE — Plan of Care (Signed)
  Problem: Nutrition: Goal: Risk of aspiration will decrease Outcome: Progressing   Problem: Metabolic: Goal: Ability to maintain appropriate glucose levels will improve Outcome: Progressing

## 2022-02-21 NOTE — Progress Notes (Signed)
PROGRESS NOTE    William Acevedo  WUX:[id number] DOB: 10-09-1955 DOA: 02/17/2022 PCP: [facility]   Brief Narrative:  HPI: JAIMIN Acevedo is a 67 y.o. male with medical history significant of  CAD s/p NSTEMI, hyperlipedemia, DMII insulin dependent, cerebral thrombosis with CVA, Essential hypertension, hx of polysubstance abuse , hx of endocarditis, hx of pulmonary cryptococosis, GERD, etoh abuse, Schizoaffectic disorder ,PTSD Who presents to ED with increase lethargy , vomiting decreased intake x 3 days  as well as being nonverbal which is not patient baseline.  Patient currently unable to give history , but is able to follow commands he answers yes to all questions.  ED Course:  Afeb, BP 188/89, HR 103, rr 20  Sat 99% on ra  Na 141, K 4.1, glu 343, cr 1.07  Wbc 6.5, hgb 14.6, plt 235 Lactic acid 2, repeat 1.3 OZD:GUYQI tachycardia , RAE Ua:neg UDS, + cocaine  Assessment & Plan:   Principal Problem:   CVA (cerebral vascular accident) (HCC) Active Problems:   Alcohol use disorder   Polysubstance abuse (HCC)   DM2 (diabetes mellitus, type 2) (HCC)   Acute encephalopathy   NSTEMI (non-ST elevated myocardial infarction) (HCC)   Cocaine abuse (HCC)   Schizoaffective disorder (HCC)  Acute encephalopathy/acute to early subacute ischemic infarct in medial left frontal lobe correlating with left ACA territory and ACA-PCA watershed/hypertensive emergency: Also has left A2 segment occlusion, corresponding to the infarct along with severe stenosis of proximal cavernous segment of the left ICA.  Also has history of prior CVA.  Patient's expressive aphasia is intermittent, and today, he is able to speak and answer all the questions.  He has been started on aspirin and Plavix by neurology and recommendation is to continue DAPT for 3 weeks and then Plavix alone.  Significantly elevated LDL 217 with cholesterol 291, atorvastatin increased to 80 mg. Recent hemoglobin A1c done in  January was 10.6.  Seen by PT OT and they recommended CIR. patient is medically stable for discharge to CIR today.  Hypertension: We allowed permissive hypertension initially, Toprol-XL was resumed and patient had low blood pressure yesterday and he was confused but lactic acid was normal.  He was started on IV fluids due to poor p.o. intake.  Blood pressure has improved today.  I will reduce frequency of fluids to 75 cc/h.   Type 2 diabetes mellitus: Patient appears to be taking glipizide as well as Lantus 15 units twice daily at home.  He was started on 20 units of Semglee, he had hypoglycemia, Semglee was reduced to 10 units.  He is slightly hyperglycemic but we will watch him for another day.  Continue SSI.  CAD s/p NSTEMI: Continue home medications.  History of polysubstance abuse: Cocaine positive in UDS.  Also uses EtOH.  GERD: PPI  Schizoaffective disorder/PTSD: Continue home meds.  Patient does not appear to be having capacity to make healthcare decisions for himself.   DVT prophylaxis: heparin injection 5,000 Units Start: 02/18/22 0600 SCD's Start: 02/17/22 2140   Code Status: Full Code  Family Communication:  None present at bedside.   Status is: Inpatient Remains inpatient appropriate because: Medically stable, pending placement.   Estimated body mass index is 16.08 kg/m as calculated from the following:   Height as of this encounter: 5\' 6"  (1.676 m).   Weight as of this encounter: 45.2 kg.    Nutritional Assessment: Body mass index is 16.08 kg/m. Seen by dietician.  I agree with the assessment and  plan as outlined below: Nutrition Status:        . Skin Assessment: I have examined the patient's skin and I agree with the wound assessment as performed by the wound care RN as outlined below:    Consultants:  Neurology  Procedures:  None  Antimicrobials:  Anti-infectives (From admission, onward)    None         Subjective:  Patient seen and  examined.  He is much more alert and oriented today and is able to answer all the questions as well.  Objective: Vitals:   02/20/22 1956 02/21/22 0033 02/21/22 0313 02/21/22 0751  BP: 120/66 120/68 (!) 112/59 117/68  Pulse: 77 79 80 71  Resp: 15 16 16 16   Temp: 98 F (36.7 C) 98.2 F (36.8 C) 97.9 F (36.6 C) 98.4 F (36.9 C)  TempSrc: Oral Oral Oral Oral  SpO2: 99% 99% 100% 100%  Weight:      Height:        Intake/Output Summary (Last 24 hours) at 02/21/2022 0935 Last data filed at 02/21/2022 2831 Gross per 24 hour  Intake 2164.49 ml  Output 650 ml  Net 1514.49 ml    Filed Weights   02/18/22 0525  Weight: 45.2 kg    Examination:  General exam: Appears calm and comfortable  Respiratory system: Clear to auscultation. Respiratory effort normal. Cardiovascular system: S1 & S2 heard, RRR. No JVD, murmurs, rubs, gallops or clicks. No pedal edema. Gastrointestinal system: Abdomen is nondistended, soft and nontender. No organomegaly or masses felt. Normal bowel sounds heard. Central nervous system: Alert and fully oriented. No focal neurological deficits. Extremities: Symmetric 5 x 5 power.   Data Reviewed: I have personally reviewed following labs and imaging studies  CBC: Recent Labs  Lab 02/17/22 1513 02/18/22 0652 02/19/22 0248  WBC 6.5 6.7 6.2  NEUTROABS 5.0  --  3.1  HGB 14.6 15.2 13.6  HCT 47.1 49.1 40.3  MCV 89.4 89.9 86.1  PLT 235 227 517    Basic Metabolic Panel: Recent Labs  Lab 02/17/22 1513 02/18/22 0652 02/19/22 0248 02/21/22 0824  NA 141 144 141 138  K 4.1 4.0 3.4* 5.7*  CL 105 108 110 107  CO2 23 19* 23 18*  GLUCOSE 343* 208* 53* 214*  BUN 29* 20 19 28*  CREATININE 1.07 1.02 0.70 1.42*  CALCIUM 8.7* 9.1 8.5* 8.3*  MG  --   --   --  2.2    GFR: Estimated Creatinine Clearance: 31.8 mL/min (A) (by C-G formula based on SCr of 1.42 mg/dL (H)). Liver Function Tests: Recent Labs  Lab 02/17/22 1513 02/18/22 0652  AST 30 27  ALT 23 21   ALKPHOS 58 62  BILITOT 0.8 0.8  PROT 7.4 7.3  ALBUMIN 3.6 3.7    Recent Labs  Lab 02/17/22 2119  LIPASE 31    Recent Labs  Lab 02/17/22 1514  AMMONIA <10    Coagulation Profile: No results for input(s): "INR", "PROTIME" in the last 168 hours. Cardiac Enzymes: No results for input(s): "CKTOTAL", "CKMB", "CKMBINDEX", "TROPONINI" in the last 168 hours. BNP (last 3 results) No results for input(s): "PROBNP" in the last 8760 hours. HbA1C: No results for input(s): "HGBA1C" in the last 72 hours. CBG: Recent Labs  Lab 02/20/22 1644 02/20/22 1850 02/20/22 2107 02/20/22 2116 02/21/22 0611  GLUCAP 60* 88 129* 128* 231*    Lipid Profile: No results for input(s): "CHOL", "HDL", "LDLCALC", "TRIG", "CHOLHDL", "LDLDIRECT" in the last 72 hours.  Thyroid  Function Tests: No results for input(s): "TSH", "T4TOTAL", "FREET4", "T3FREE", "THYROIDAB" in the last 72 hours. Anemia Panel: No results for input(s): "VITAMINB12", "FOLATE", "FERRITIN", "TIBC", "IRON", "RETICCTPCT" in the last 72 hours. Sepsis Labs: Recent Labs  Lab 02/17/22 1514 02/17/22 2119 02/20/22 1021  LATICACIDVEN 2.0* 1.3 1.5     Recent Results (from the past 240 hour(s))  Resp panel by RT-PCR (RSV, Flu A&B, Covid) Anterior Nasal Swab     Status: None   Collection Time: 02/17/22  3:48 PM   Specimen: Anterior Nasal Swab  Result Value Ref Range Status   SARS Coronavirus 2 by RT PCR NEGATIVE NEGATIVE Final    Comment: (NOTE) SARS-CoV-2 target nucleic acids are NOT DETECTED.  The SARS-CoV-2 RNA is generally detectable in upper respiratory specimens during the acute phase of infection. The lowest concentration of SARS-CoV-2 viral copies this assay can detect is 138 copies/mL. A negative result does not preclude SARS-Cov-2 infection and should not be used as the sole basis for treatment or other patient management decisions. A negative result may occur with  improper specimen collection/handling, submission of  specimen other than nasopharyngeal swab, presence of viral mutation(s) within the areas targeted by this assay, and inadequate number of viral copies(<138 copies/mL). A negative result must be combined with clinical observations, patient history, and epidemiological information. The expected result is Negative.  Fact Sheet for Patients:  EntrepreneurPulse.com.au  Fact Sheet for Healthcare Providers:  IncredibleEmployment.be  This test is no t yet approved or cleared by the [place] FDA and  has been authorized for detection and/or diagnosis of SARS-CoV-2 by FDA under an Emergency Use Authorization (EUA). This EUA will remain  in effect (meaning this test can be used) for the duration of the COVID-19 declaration under Section 564(b)(1) of the Act, 21 U.S.C.section 360bbb-3(b)(1), unless the authorization is terminated  or revoked sooner.       Influenza A by PCR NEGATIVE NEGATIVE Final   Influenza B by PCR NEGATIVE NEGATIVE Final    Comment: (NOTE) The Xpert Xpress SARS-CoV-2/FLU/RSV plus assay is intended as an aid in the diagnosis of influenza from Nasopharyngeal swab specimens and should not be used as a sole basis for treatment. Nasal washings and aspirates are unacceptable for Xpert Xpress SARS-CoV-2/FLU/RSV testing.  Fact Sheet for Patients: EntrepreneurPulse.com.au  Fact Sheet for Healthcare Providers: IncredibleEmployment.be  This test is not yet approved or cleared by the [place] FDA and has been authorized for detection and/or diagnosis of SARS-CoV-2 by FDA under an Emergency Use Authorization (EUA). This EUA will remain in effect (meaning this test can be used) for the duration of the COVID-19 declaration under Section 564(b)(1) of the Act, 21 U.S.C. section 360bbb-3(b)(1), unless the authorization is terminated or revoked.     Resp Syncytial Virus by PCR NEGATIVE NEGATIVE Final     Comment: (NOTE) Fact Sheet for Patients: EntrepreneurPulse.com.au  Fact Sheet for Healthcare Providers: IncredibleEmployment.be  This test is not yet approved or cleared by the [place] FDA and has been authorized for detection and/or diagnosis of SARS-CoV-2 by FDA under an Emergency Use Authorization (EUA). This EUA will remain in effect (meaning this test can be used) for the duration of the COVID-19 declaration under Section 564(b)(1) of the Act, 21 U.S.C. section 360bbb-3(b)(1), unless the authorization is terminated or revoked.  Performed at [facility], [place] [address], [place],  [address]      Radiology Studies: No results found.  Scheduled Meds:  aspirin EC  81 mg  Oral Daily   atorvastatin  80 mg Oral QHS   clopidogrel  75 mg Oral Daily   dapagliflozin propanediol  10 mg Oral Daily   DULoxetine  60 mg Oral Daily   famotidine  20 mg Oral QAC breakfast   folic acid  1 mg Oral Daily   gabapentin  300 mg Oral BID   heparin  5,000 Units Subcutaneous Q8H   insulin aspart  0-9 Units Subcutaneous TID WC   insulin glargine-yfgn  10 Units Subcutaneous Daily   metoprolol succinate  25 mg Oral Daily   OLANZapine  5 mg Oral QHS   pantoprazole  40 mg Oral Daily   traZODone  100 mg Oral QHS   Continuous Infusions:  sodium chloride 125 mL/hr (02/21/22 0831)      LOS: 4 days   Darliss Cheney, MD [facility]  02/21/2022, 9:35 AM   *Please note that this is a verbal dictation therefore any spelling or grammatical errors are due to the "[employer]" system interpretation.  Please page via [place] and do not message via secure chat for urgent patient care matters. Secure chat can be used for non urgent patient care matters.  How to contact the [facility] Attending or Consulting provider [place] or covering provider during after hours [place], for this patient?  Check the care team in [facility] and look for a)  attending/consulting TRH provider listed and b) the [facility] team listed. Page or secure chat [phone]. Log into [email or web address] and use [place]'s universal password to access. If you do not have the password, please contact the hospital operator. Locate the [facility] provider you are looking for under [facility] and page to a number that you can be directly reached. If you still have difficulty reaching the provider, please page the [facility] (Director on Call) for the Hospitalists listed on amion for assistance.

## 2022-02-21 NOTE — Progress Notes (Addendum)
Inpatient Rehab Admissions Coordinator:   I spoke with pt.'s brother, Colie Fugitt, regarding potential CIR admit for Pt. He states that he is interested in CIR for Pt. And that Pt. Lived with him PTA and that at baseline, he assisted Pt. With getting to appointments and preparing meals and occasional help with iADLs. He also provided 24/7 support and states that he can continue to provide 24/7 supervision upon d/c and can provide has no limitations as far as the amount of care he can provide. I will open a case with the [facility] and pursue for admission.  Clemens Catholic, [place], [place] Admissions Coordinator  [phone] ([place]) [phone] (office)

## 2022-02-21 NOTE — Progress Notes (Addendum)
Physical Therapy Treatment Patient Details Name: WILTON THRALL MRN: [id number] DOB: 04-07-1955 Today's Date: 02/21/2022   History of Present Illness Pt is a 67 y.o. M who presents 02/17/2022 with increased lethargy, vomiting, decreased intake x 3 days. MRI with 1. Acute to early subacute infarct in the medial left frontal lobe,  correlating with the left ACA territory and ACA-PCA watershed. No  evidence of hemorrhagic transformation or significant mass effect. 2. Remote infarcts involving the bilateral occipital cortex, right  parietal cortex, and posterior left frontal lobe white matter. PMH includes: HTN, HLD, CVA, uncontrolled DM II, renal infarct, major depressive disorder, polysubstance abuse (cocaine and marijuana), multiple recent admissions for abdominal pain, shingles, and multifocal acute infarcts.    PT Comments    Pt more alert this session and oriented to place/time. Intermittent agitation, but able to be redirected. Pt requiring min assist for transfers and ambulating 40 ft with moderate assist. Demonstrates decreased bilateral foot clearance (R>L) and erratic R foot placement, indicating likely proprioceptive and sensory deficits through RLE. Will benefit from continued gait and balance training and strengthening. Recommend AIR to address deficits and maximize functional mobility.    Recommendations for follow up therapy are one component of a multi-disciplinary discharge planning process, led by the attending physician.  Recommendations may be updated based on patient status, additional functional criteria and insurance authorization.  Follow Up Recommendations  Acute inpatient rehab (3hours/day)     Assistance Recommended at Discharge Frequent or constant Supervision/Assistance  Patient can return home with the following A little help with walking and/or transfers;A little help with bathing/dressing/bathroom;Assistance with cooking/housework;Assist for transportation;Help with  stairs or ramp for entrance;Direct supervision/assist for medications management;Direct supervision/assist for financial management   Equipment Recommendations  Other (comment) (TBD)    Recommendations for Other Services Rehab consult     Precautions / Restrictions Precautions Precautions: Fall Restrictions Weight Bearing Restrictions: No     Mobility  Bed Mobility Overal bed mobility: Needs Assistance Bed Mobility: Supine to Sit, Sit to Supine     Supine to sit: Mod assist Sit to supine: Min assist   General bed mobility comments: Assist for trunk to sit up on edge of bed, significantly increased time. MinA for RLE negotiation back into bed    Transfers Overall transfer level: Needs assistance Equipment used: 1 person hand held assist Transfers: Sit to/from Stand Sit to Stand: Min assist           General transfer comment: MinA to rise and steady    Ambulation/Gait Ambulation/Gait assistance: Mod assist Gait Distance (Feet): 40 Feet Assistive device: 1 person hand held assist Gait Pattern/deviations: Decreased stride length, Narrow base of support, Shuffle, Step-to pattern, Decreased step length - right, Decreased dorsiflexion - right, Ataxic Gait velocity: decreased     General Gait Details: Pt with decreased R foot clearance, increased external rotation, and irregular foot placement. No carry over noted for visual or verbal cues for increased step/stride length. ModA for balance, frequent anterior LOB. Reaching out for external support   Stairs             Wheelchair Mobility    Modified Rankin (Stroke Patients Only) Modified Rankin (Stroke Patients Only) Pre-Morbid Rankin Score: No symptoms Modified Rankin: Moderately severe disability     Balance Overall balance assessment: Needs assistance Sitting-balance support: Feet supported Sitting balance-Leahy Scale: Fair     Standing balance support: No upper extremity supported, During  functional activity Standing balance-Leahy Scale: Poor Standing balance comment: up to  modA                            Cognition Arousal/Alertness: Awake/alert Behavior During Therapy: Flat affect Overall Cognitive Status: Impaired/Different from baseline Area of Impairment: Orientation, Attention, Following commands, Memory, Safety/judgement, Problem solving, Awareness                 Orientation Level: Disoriented to, Time, Situation Current Attention Level: Sustained Memory: Decreased short-term memory Following Commands: Follows one step commands consistently Safety/Judgement: Decreased awareness of safety, Decreased awareness of deficits Awareness: Intellectual Problem Solving: Requires verbal cues General Comments: More alert today; oriented to place and time, but not situation, stating, "I'm here for treatment." Intermittent agitation noted but able to redirect with calm voice. significantly increased time and difficulty with motor planning/processing        Exercises      General Comments        Pertinent Vitals/Pain Pain Assessment Pain Assessment: Faces Faces Pain Scale: No hurt    Home Living                          Prior Function            PT Goals (current goals can now be found in the care plan section) Acute Rehab PT Goals Patient Stated Goal: unable PT Goal Formulation: With patient Time For Goal Achievement: 03/04/22 Potential to Achieve Goals: Good Progress towards PT goals: Progressing toward goals    Frequency    Min 3X/week      PT Plan Current plan remains appropriate    Co-evaluation              AM-PAC PT "6 Clicks" Mobility   Outcome Measure  Help needed turning from your back to your side while in a flat bed without using bedrails?: A Little Help needed moving from lying on your back to sitting on the side of a flat bed without using bedrails?: A Little Help needed moving to and from a bed  to a chair (including a wheelchair)?: A Little Help needed standing up from a chair using your arms (e.g., wheelchair or bedside chair)?: A Little Help needed to walk in hospital room?: A Lot Help needed climbing 3-5 steps with a railing? : A Lot 6 Click Score: 16    End of Session Equipment Utilized During Treatment: Gait belt Activity Tolerance: Patient tolerated treatment well Patient left: in bed;with call bell/phone within reach;with bed alarm set;with restraints reapplied Nurse Communication: Mobility status PT Visit Diagnosis: Unsteadiness on feet (R26.81);Difficulty in walking, not elsewhere classified (R26.2)     Time: 4854-6270 PT Time Calculation (min) (ACUTE ONLY): 32 min  Charges:  $Gait Training: 8-22 mins $Therapeutic Activity: 8-22 mins                     Wyona Almas, PT, DPT Acute Rehabilitation Services Office [place] 02/21/2022, 12:04 PM

## 2022-02-21 NOTE — Progress Notes (Signed)
Inpatient Rehab Admissions Coordinator:    CIR following for potential admit, will send case to [place] once OT note is entered.   Clemens Catholic, [place], [place] Admissions Coordinator  [phone] ([place]) [phone] (office)

## 2022-02-22 DIAGNOSIS — I639 Cerebral infarction, unspecified: Secondary | ICD-10-CM | POA: Diagnosis not present

## 2022-02-22 LAB — GLUCOSE, CAPILLARY
Glucose-Capillary: 223 mg/dL — ABNORMAL HIGH (ref 70–99)
Glucose-Capillary: 91 mg/dL (ref 70–99)
Glucose-Capillary: 232 mg/dL — ABNORMAL HIGH (ref 70–99)

## 2022-02-22 MED ORDER — NEPRO/CARBSTEADY PO LIQD
237.0000 mL | Freq: Three times a day (TID) | ORAL | Status: DC
  Administered 2022-02-22 – 2022-02-24 (×7): 237 mL via ORAL

## 2022-02-22 MED ORDER — ADULT MULTIVITAMIN W/MINERALS CH
1.0000 | ORAL_TABLET | Freq: Every day | ORAL | Status: DC
  Administered 2022-02-22 – 2022-02-24 (×3): 1 via ORAL
  Filled 2022-02-22 (×3): qty 1

## 2022-02-22 NOTE — Progress Notes (Signed)
Pt refused heparin. This nurse explained the rational of taking heparin. When asked why he did not want to take heparin he stated, "because it's in the stomach."  Nurse offered to give injection in leg or arm. Patient continued to decline.

## 2022-02-22 NOTE — Plan of Care (Signed)
  Problem: Self-Care: Goal: Ability to communicate needs accurately will improve Outcome: Progressing   Problem: Education: Goal: Knowledge of disease or condition will improve Outcome: Not Progressing   Problem: Health Behavior/Discharge Planning: Goal: Ability to manage health-related needs will improve Outcome: Not Progressing   Problem: Metabolic: Goal: Ability to maintain appropriate glucose levels will improve Outcome: Not Progressing   Problem: Education: Goal: Knowledge of General Education information will improve Description: Including pain rating scale, medication(s)/side effects and non-pharmacologic comfort measures Outcome: Not Progressing   Problem: Self-Care: Goal: Ability to communicate needs accurately will improve Outcome: Progressing

## 2022-02-22 NOTE — Progress Notes (Signed)
Physical Therapy Treatment Patient Details Name: William Acevedo MRN: [id number] DOB: 09/25/1955 Today's Date: 02/22/2022   History of Present Illness Pt is a 67 y.o. M who presents 02/17/2022 with increased lethargy, vomiting, decreased intake x 3 days. MRI with 1. Acute to early subacute infarct in the medial left frontal lobe,  correlating with the left ACA territory and ACA-PCA watershed. No  evidence of hemorrhagic transformation or significant mass effect. 2. Remote infarcts involving the bilateral occipital cortex, right  parietal cortex, and posterior left frontal lobe white matter. PMH includes: HTN, HLD, CVA, uncontrolled DM II, renal infarct, major depressive disorder, polysubstance abuse (cocaine and marijuana), multiple recent admissions for abdominal pain, shingles, and multifocal acute infarcts.    PT Comments    Pt progressing towards his physical therapy goals, with increased activity tolerance and ambulation distance this session. Pt with improved balance with RW vs with no AD. However, has decreased awareness of pacing, noting signs/symptoms of fatigue and RLE management. Requiring up to moderate assist for dynamic balance. Demonstrates R sided weakness, proprioceptive/sensory deficits, impaired communication, decreased cognition. Continue to recommend acute inpatient rehab (AIR) for post-acute therapy needs.    Recommendations for follow up therapy are one component of a multi-disciplinary discharge planning process, led by the attending physician.  Recommendations may be updated based on patient status, additional functional criteria and insurance authorization.  Follow Up Recommendations  Acute inpatient rehab (3hours/day)     Assistance Recommended at Discharge Frequent or constant Supervision/Assistance  Patient can return home with the following A little help with bathing/dressing/bathroom;Assistance with cooking/housework;Assist for transportation;Help with stairs or  ramp for entrance;Direct supervision/assist for medications management;Direct supervision/assist for financial management;A lot of help with walking and/or transfers   Equipment Recommendations  Other (comment) (TBA)    Recommendations for Other Services       Precautions / Restrictions Precautions Precautions: Fall Restrictions Weight Bearing Restrictions: No     Mobility  Bed Mobility Overal bed mobility: Needs Assistance Bed Mobility: Supine to Sit, Sit to Supine     Supine to sit: Mod assist Sit to supine: Min assist   General bed mobility comments: requiring increased assist to exit bed this date, needing assist to bring BLE' off edge of bed. Increased time for scooting to edge of bed. MinA for LE assist returning to bed    Transfers Overall transfer level: Needs assistance Equipment used: Rolling walker (2 wheels) Transfers: Sit to/from Stand Sit to Stand: Min assist           General transfer comment: MinA to rise and steady    Ambulation/Gait Ambulation/Gait assistance: Mod assist, +2 safety/equipment Gait Distance (Feet): 150 Feet Assistive device: Rolling walker (2 wheels) Gait Pattern/deviations: Step-to pattern, Decreased stance time - right, Decreased weight shift to right, Decreased step length - right, Decreased dorsiflexion - right Gait velocity: decreased Gait velocity interpretation: <1.31 ft/sec, indicative of household ambulator   General Gait Details: Pt with decreased R foot clearance, often dragging (particularly with fatigue) and keeping outside of the RW during turns. Requires verbal/visual cueing, but has decreased carryover. Up to [facility] for balance. Stumbling when fatigued.   Stairs             Wheelchair Mobility    Modified Rankin (Stroke Patients Only) Modified Rankin (Stroke Patients Only) Pre-Morbid Rankin Score: No symptoms Modified Rankin: Moderately severe disability     Balance Overall balance assessment: Needs  assistance Sitting-balance support: Feet supported Sitting balance-Leahy Scale: Fair  Standing balance support: During functional activity, Bilateral upper extremity supported Standing balance-Leahy Scale: Poor                              Cognition Arousal/Alertness: Awake/alert Behavior During Therapy: Flat affect Overall Cognitive Status: Impaired/Different from baseline Area of Impairment: Attention, Memory, Following commands, Safety/judgement, Awareness, Problem solving                   Current Attention Level: Sustained Memory: Decreased short-term memory Following Commands: Follows one step commands inconsistently Safety/Judgement: Decreased awareness of safety, Decreased awareness of deficits Awareness: Intellectual Problem Solving: Slow processing, Decreased initiation, Requires verbal cues General Comments: Decreased carryover, increased time for motor planning/initiation        Exercises      General Comments        Pertinent Vitals/Pain Pain Assessment Pain Assessment: Faces Faces Pain Scale: No hurt    Home Living                          Prior Function            PT Goals (current goals can now be found in the care plan section) Acute Rehab PT Goals Patient Stated Goal: unable Potential to Achieve Goals: Good Progress towards PT goals: Progressing toward goals    Frequency    Min 3X/week      PT Plan Current plan remains appropriate    Co-evaluation              AM-PAC PT "6 Clicks" Mobility   Outcome Measure  Help needed turning from your back to your side while in a flat bed without using bedrails?: A Little Help needed moving from lying on your back to sitting on the side of a flat bed without using bedrails?: A Lot Help needed moving to and from a bed to a chair (including a wheelchair)?: A Little Help needed standing up from a chair using your arms (e.g., wheelchair or bedside chair)?: A  Little Help needed to walk in hospital room?: A Lot Help needed climbing 3-5 steps with a railing? : Total 6 Click Score: 14    End of Session Equipment Utilized During Treatment: Gait belt Activity Tolerance: Patient tolerated treatment well Patient left: in bed;with call bell/phone within reach;with bed alarm set Nurse Communication: Mobility status PT Visit Diagnosis: Unsteadiness on feet (R26.81);Difficulty in walking, not elsewhere classified (R26.2)     Time: 8115-7262 PT Time Calculation (min) (ACUTE ONLY): 25 min  Charges:  $Gait Training: 8-22 mins $Therapeutic Activity: 8-22 mins                     Wyona Almas, PT, DPT Acute Rehabilitation Services Office [phone]    Deno Etienne 02/22/2022, 4:17 PM

## 2022-02-22 NOTE — Progress Notes (Addendum)
Mobility Specialist Progress Note   02/22/22 0930  Mobility  Activity Moved into chair position in bed (HOB >30 degrees)  Level of Assistance Dependent, patient does less than 25%  Assistive Device None  Range of Motion/Exercises Active;All extremities  Mobility Referral Yes   Patient received in supine, with HOB at 12 degrees and attempting to eat breakfast. Patient seemingly was unaware of risk. Assisted patient with repositioning requiring max-dependent level of assistance. Was able to assist by pushing LE's against bed for better positioning. Was left in supine with HOB >30 degrees, breakfast tray set and all needs met, call bell in reach. Will return later for ambulation per patient request.   [place] Edna Rede, BS EXP Mobility Specialist Please contact via SecureChat or Rehab office at [phone]

## 2022-02-22 NOTE — Progress Notes (Shared)
Inpatient Rehab Admissions Coordinator:    I continue to await insurance auth from the [facility] for CIR admit. I sent case yesterday. I will continue to follow for potential admit pending insurance approval.  Clemens Catholic, [place], [place] Admissions Coordinator  [phone] (celll) [phone] (office)

## 2022-02-22 NOTE — Progress Notes (Signed)
PROGRESS NOTE    William Acevedo  FBP:[id number] DOB: July 01, 1955 DOA: 02/17/2022 PCP: [facility]   Brief Narrative:  HPI: William Acevedo is a 67 y.o. male with medical history significant of  CAD s/p NSTEMI, hyperlipedemia, DMII insulin dependent, cerebral thrombosis with CVA, Essential hypertension, hx of polysubstance abuse , hx of endocarditis, hx of pulmonary cryptococosis, GERD, etoh abuse, Schizoaffectic disorder ,PTSD Who presents to ED with increase lethargy , vomiting decreased intake x 3 days  as well as being nonverbal which is not patient baseline.  Patient currently unable to give history , but is able to follow commands he answers yes to all questions.  ED Course:  Afeb, BP 188/89, HR 103, rr 20  Sat 99% on ra  Na 141, K 4.1, glu 343, cr 1.07  Wbc 6.5, hgb 14.6, plt 235 Lactic acid 2, repeat 1.3 OEU:MPNTI tachycardia , RAE Ua:neg UDS, + cocaine  Assessment & Plan:   Principal Problem:   CVA (cerebral vascular accident) ([place]) Active Problems:   Alcohol use disorder   Polysubstance abuse ([place])   DM2 (diabetes mellitus, type 2) ([place])   Acute encephalopathy   NSTEMI (non-ST elevated myocardial infarction) ([place])   Cocaine abuse ([place])   Schizoaffective disorder ([place])  Acute encephalopathy/acute to early subacute ischemic infarct in medial left frontal lobe correlating with left ACA territory and ACA-PCA watershed/hypertensive emergency: Also has left A2 segment occlusion, corresponding to the infarct along with severe stenosis of proximal cavernous segment of the left ICA.  Also has history of prior CVA.  Patient's expressive aphasia is intermittent, and today, he is able to speak and answer all the questions.  He has been started on aspirin and Plavix by neurology and recommendation is to continue DAPT for 3 weeks and then Plavix alone.  Significantly elevated LDL 217 with cholesterol 291, atorvastatin increased to 80 mg. Recent hemoglobin A1c done in  January was 10.6.  Seen by PT OT and they recommended CIR. patient is medically stable for discharge to CIR today.  Waiting for insurance authorization from [place].  Hypertension: We allowed permissive hypertension initially, Toprol-XL was resumed and patient had low blood pressure and he was confused but lactic acid was normal.  He was started on IV fluids due to poor p.o. intake.  Blood pressure improved and IV fluids were stopped.  His blood pressures fairly within normal range now.  Type 2 diabetes mellitus: Patient appears to be taking glipizide as well as Lantus 15 units twice daily at home.  He was started on 20 units of Semglee, he had hypoglycemia, Semglee was reduced to 10 units.  Blood pressure labile.  Continue this as well as SSI.  CAD s/p NSTEMI: Continue home medications.  History of polysubstance abuse: Cocaine positive in UDS.  Also uses EtOH.  GERD: PPI  Schizoaffective disorder/PTSD: Continue home meds.  Patient does not appear to be having capacity to make healthcare decisions for himself.   DVT prophylaxis: heparin injection 5,000 Units Start: 02/18/22 0600 SCD's Start: 02/17/22 2140   Code Status: Full Code  Family Communication:  None present at bedside.   Status is: Inpatient Remains inpatient appropriate because: Medically stable, pending placement.   Estimated body mass index is 16.08 kg/m as calculated from the following:   Height as of this encounter: 5\' 6"  (1.676 m).   Weight as of this encounter: 45.2 kg.    Nutritional Assessment: Body mass index is 16.08 kg/m.[name] Seen by dietician.  I agree with the  assessment and plan as outlined below: Nutrition Status:        . Skin Assessment: I have examined the patient's skin and I agree with the wound assessment as performed by the wound care RN as outlined below:    Consultants:  Neurology  Procedures:  None  Antimicrobials:  Anti-infectives (From admission, onward)    None          Subjective:  Patient seen and examined.  Again today, he is not talking to me.  He has intermittent expressive aphasia.  He appears comfortable .  Objective: Vitals:   02/22/22 0030 02/22/22 0415 02/22/22 0854 02/22/22 1050  BP: 122/64 128/67 (!) 162/86 (!) 141/71  Pulse:   79 73  Resp: 16 16 16 16   Temp: 98.1 F (36.7 C) 97.9 F (36.6 C) 97.7 F (36.5 C)   TempSrc: Oral Oral Oral   SpO2: 99% 98% 100% 100%  Weight:      Height:        Intake/Output Summary (Last 24 hours) at 02/22/2022 1104 Last data filed at 02/22/2022 0434 Gross per 24 hour  Intake 1264.71 ml  Output 1525 ml  Net -260.29 ml    Filed Weights   02/18/22 0525  Weight: 45.2 kg    Examination:  General exam: Appears calm and comfortable  Respiratory system: Clear to auscultation. Respiratory effort normal. Cardiovascular system: S1 & S2 heard, RRR. No JVD, murmurs, rubs, gallops or clicks. No pedal edema. Gastrointestinal system: Abdomen is nondistended, soft and nontender. No organomegaly or masses felt. Normal bowel sounds heard. Central nervous system: Alert but unable to assess orientation today due to expressive aphasia today.  No focal neurological deficits. Extremities: Symmetric 5 x 5 power.   Data Reviewed: I have personally reviewed following labs and imaging studies  CBC: Recent Labs  Lab 02/17/22 1513 02/18/22 0652 02/19/22 0248  WBC 6.5 6.7 6.2  NEUTROABS 5.0  --  3.1  HGB 14.6 15.2 13.6  HCT 47.1 49.1 40.3  MCV 89.4 89.9 86.1  PLT 235 227 505    Basic Metabolic Panel: Recent Labs  Lab 02/17/22 1513 02/18/22 0652 02/19/22 0248 02/21/22 0824 02/21/22 1013  NA 141 144 141 138 136  K 4.1 4.0 3.4* 5.7* 4.1  CL 105 108 110 107 106  CO2 23 19* 23 18* 22  GLUCOSE 343* 208* 53* 214* 173*  BUN 29* 20 19 28* 22  CREATININE 1.07 1.02 0.70 1.42* 0.89  CALCIUM 8.7* 9.1 8.5* 8.3* 8.3*  MG  --   --   --  2.2  --     GFR: Estimated Creatinine Clearance: 50.8 mL/min (by C-G  formula based on SCr of 0.89 mg/dL). Liver Function Tests: Recent Labs  Lab 02/17/22 1513 02/18/22 0652  AST 30 27  ALT 23 21  ALKPHOS 58 62  BILITOT 0.8 0.8  PROT 7.4 7.3  ALBUMIN 3.6 3.7    Recent Labs  Lab 02/17/22 2119  LIPASE 31    Recent Labs  Lab 02/17/22 1514  AMMONIA <10    Coagulation Profile: No results for input(s): "INR", "PROTIME" in the last 168 hours. Cardiac Enzymes: No results for input(s): "CKTOTAL", "CKMB", "CKMBINDEX", "TROPONINI" in the last 168 hours. BNP (last 3 results) No results for input(s): "PROBNP" in the last 8760 hours. HbA1C: No results for input(s): "HGBA1C" in the last 72 hours. CBG: Recent Labs  Lab 02/20/22 2116 02/21/22 0611 02/21/22 1202 02/21/22 1629 02/21/22 2113  GLUCAP 128* 231* 186* 107* 178*  Lipid Profile: No results for input(s): "CHOL", "HDL", "LDLCALC", "TRIG", "CHOLHDL", "LDLDIRECT" in the last 72 hours.  Thyroid Function Tests: No results for input(s): "TSH", "T4TOTAL", "FREET4", "T3FREE", "THYROIDAB" in the last 72 hours. Anemia Panel: No results for input(s): "VITAMINB12", "FOLATE", "FERRITIN", "TIBC", "IRON", "RETICCTPCT" in the last 72 hours. Sepsis Labs: Recent Labs  Lab 02/17/22 1514 02/17/22 2119 02/20/22 1021  LATICACIDVEN 2.0* 1.3 1.5     Recent Results (from the past 240 hour(s))  Resp panel by RT-PCR (RSV, Flu A&B, Covid) Anterior Nasal Swab     Status: None   Collection Time: 02/17/22  3:48 PM   Specimen: Anterior Nasal Swab  Result Value Ref Range Status   SARS Coronavirus 2 by RT PCR NEGATIVE NEGATIVE Final    Comment: (NOTE) SARS-CoV-2 target nucleic acids are NOT DETECTED.  The SARS-CoV-2 RNA is generally detectable in upper respiratory specimens during the acute phase of infection. The lowest concentration of SARS-CoV-2 viral copies this assay can detect is 138 copies/mL. A negative result does not preclude SARS-Cov-2 infection and should not be used as the sole basis for  treatment or other patient management decisions. A negative result may occur with  improper specimen collection/handling, submission of specimen other than nasopharyngeal swab, presence of viral mutation(s) within the areas targeted by this assay, and inadequate number of viral copies(<138 copies/mL). A negative result must be combined with clinical observations, patient history, and epidemiological information. The expected result is Negative.  Fact Sheet for Patients:  BloggerCourse.com  Fact Sheet for Healthcare Providers:  SeriousBroker.it  This test is no t yet approved or cleared by the [place] FDA and  has been authorized for detection and/or diagnosis of SARS-CoV-2 by FDA under an Emergency Use Authorization (EUA). This EUA will remain  in effect (meaning this test can be used) for the duration of the COVID-19 declaration under Section 564(b)(1) of the Act, 21 U.S.C.section 360bbb-3(b)(1), unless the authorization is terminated  or revoked sooner.       Influenza A by PCR NEGATIVE NEGATIVE Final   Influenza B by PCR NEGATIVE NEGATIVE Final    Comment: (NOTE) The Xpert Xpress SARS-CoV-2/FLU/RSV plus assay is intended as an aid in the diagnosis of influenza from Nasopharyngeal swab specimens and should not be used as a sole basis for treatment. Nasal washings and aspirates are unacceptable for Xpert Xpress SARS-CoV-2/FLU/RSV testing.  Fact Sheet for Patients: BloggerCourse.com  Fact Sheet for Healthcare Providers: SeriousBroker.it  This test is not yet approved or cleared by the [place] FDA and has been authorized for detection and/or diagnosis of SARS-CoV-2 by FDA under an Emergency Use Authorization (EUA). This EUA will remain in effect (meaning this test can be used) for the duration of the COVID-19 declaration under Section 564(b)(1) of the Act, 21  U.S.C. section 360bbb-3(b)(1), unless the authorization is terminated or revoked.     Resp Syncytial Virus by PCR NEGATIVE NEGATIVE Final    Comment: (NOTE) Fact Sheet for Patients: BloggerCourse.com  Fact Sheet for Healthcare Providers: SeriousBroker.it  This test is not yet approved or cleared by the [place] FDA and has been authorized for detection and/or diagnosis of SARS-CoV-2 by FDA under an Emergency Use Authorization (EUA). This EUA will remain in effect (meaning this test can be used) for the duration of the COVID-19 declaration under Section 564(b)(1) of the Act, 21 U.S.C. section 360bbb-3(b)(1), unless the authorization is terminated or revoked.  Performed at [facility], [address], [place], [place] [address]  Radiology Studies: No results found.  Scheduled Meds:  aspirin EC  81 mg Oral Daily   atorvastatin  80 mg Oral QHS   clopidogrel  75 mg Oral Daily   dapagliflozin propanediol  10 mg Oral Daily   DULoxetine  60 mg Oral Daily   famotidine  20 mg Oral QAC breakfast   folic acid  1 mg Oral Daily   gabapentin  300 mg Oral BID   heparin  5,000 Units Subcutaneous Q8H   insulin aspart  0-9 Units Subcutaneous TID WC   insulin glargine-yfgn  10 Units Subcutaneous Daily   metoprolol succinate  25 mg Oral Daily   OLANZapine  5 mg Oral QHS   pantoprazole  40 mg Oral Daily   traZODone  100 mg Oral QHS   Continuous Infusions:  sodium chloride 75 mL/hr at 02/22/22 0751      LOS: 5 days   Darliss Cheney, MD [facility]  02/22/2022, 11:04 AM   *Please note that this is a verbal dictation therefore any spelling or grammatical errors are due to the "[place]" system interpretation.  Please page via [place] and do not message via secure chat for urgent patient care matters. Secure chat can be used for non urgent patient care matters.  How to contact the [facility]  Attending or Consulting provider [place] or covering provider during after hours [place], for this patient?  Check the care team in [facility] and look for a) attending/consulting TR[facility] provider listed and b) the [facility] team listed. Page or secure chat [phone]. Log into [email or web address] and use [place]'s universal password to access. If you do not have the password, please contact the hospital operator. Locate the [facility] provider you are looking for under [facility] and page to a number that you can be directly reached. If you still have difficulty reaching the provider, please page the [facility] (Director on Call) for the Hospitalists listed on amion for assistance.

## 2022-02-22 NOTE — Progress Notes (Signed)
Pt refusing heparin shot, got very agitated when I tried to give it to him, made MD aware of noncompliance. Educated the pt on uses and importance of medication, resisted teaching.Will continue to encourage compliance.

## 2022-02-22 NOTE — Care Management Important Message (Signed)
Important Message  Patient Details  Name: William Acevedo MRN: [id number] Date of Birth: 04-Jul-1955   Medicare Important Message Given:  Yes     Orbie Pyo 02/22/2022, 3:03 PM

## 2022-02-22 NOTE — Progress Notes (Signed)
Initial Nutrition Assessment  DOCUMENTATION CODES:  Underweight  INTERVENTION:  Liberalize diet to carb modified Encourage PO intake Nepro Shake po TID for a carb consistent source of high kcal/protein, each supplement provides 425 kcal and 19 grams protein MVI with minerals daily  NUTRITION DIAGNOSIS:  Underweight related to  (inadequate PO intake) as evidenced by  (BMI of 16.1).  GOAL:   Patient will meet greater than or equal to 90% of their needs  MONITOR:   PO intake, Supplement acceptance, Labs  REASON FOR ASSESSMENT:  Consult Assessment of nutrition requirement/status  ASSESSMENT:  Pt with hx of DM type 2, CAD, HLD, HTN, GERD, hx EtOH abuse and drug abuse, and Schizoaffectic disorder presented to ED with AMS with poor PO intake x 3 days. Imaging suggestive of new CVAs  Pt unavailable x 2 attempts today. Will follow-up as able for nutrition hx and exam. Noted that pt has intermittent expressive aphasia and is unable to provide many details. Currently pending authorization to discharge to CIR.   Pt underweight but appears to have good intake this admission. High glucose. Will add carb stable nutrition supplements to augment intake. Likely malnourished, will follow-up with physical exam.   Average Meal Intake: 2/3-2/5: 75% intake x 2 recorded meals  Nutritionally Relevant Medications: Scheduled Meds:  atorvastatin  80 mg Oral QHS   famotidine  20 mg Oral QAC breakfast   folic acid  1 mg Oral Daily   insulin aspart  0-9 Units Subcutaneous TID WC   insulin glargine-yfgn  10 Units Subcutaneous Daily   pantoprazole  40 mg Oral Daily   Continuous Infusions:  sodium chloride 75 mL/hr at 02/22/22 0751   PRN Meds: senna-docusate  Labs Reviewed: CBG ranges from 107-231 mg/dL over the last 24 hours HgbA1c 10.6% (1/7)  NUTRITION - FOCUSED PHYSICAL EXAM: Defer to in-person assessment  Diet Order:   Diet Order             Diet Carb Modified Fluid consistency: Thin;  Room service appropriate? Yes  Diet effective now                   EDUCATION NEEDS:  Not appropriate for education at this time  Skin:  Skin Assessment: Reviewed RN Assessment  Last BM:     Height:  Ht Readings from Last 1 Encounters:  02/18/22 5\' 6"  (1.676 m)    Weight:  Wt Readings from Last 1 Encounters:  02/18/22 45.2 kg    Ideal Body Weight:  64.5 kg  BMI:  Body mass index is 16.08 kg/m.  Estimated Nutritional Needs:  Kcal:  1500-1800 kcal/d Protein:  75-90g/d Fluid:  1.5-1.8L/d    Ranell Patrick, RD, LDN Clinical Dietitian RD pager # available in [facility]  After hours/weekend pager # available in [facility]

## 2022-02-22 NOTE — Progress Notes (Signed)
Mobility Specialist Progress Note   02/22/22 1621  Mobility  Activity Refused mobility   Patient respectfully deferred mobility for unspecified reasons, requesting to come back later. Will f/u as time permits.  [place] Mahasin Riviere, BS EXP Mobility Specialist Please contact via SecureChat or Rehab office at [phone]

## 2022-02-23 DIAGNOSIS — F259 Schizoaffective disorder, unspecified: Secondary | ICD-10-CM | POA: Diagnosis not present

## 2022-02-23 DIAGNOSIS — G9340 Encephalopathy, unspecified: Secondary | ICD-10-CM

## 2022-02-23 DIAGNOSIS — F1410 Cocaine abuse, uncomplicated: Secondary | ICD-10-CM

## 2022-02-23 DIAGNOSIS — I639 Cerebral infarction, unspecified: Secondary | ICD-10-CM | POA: Diagnosis not present

## 2022-02-23 LAB — GLUCOSE, CAPILLARY
Glucose-Capillary: 124 mg/dL — ABNORMAL HIGH (ref 70–99)
Glucose-Capillary: 119 mg/dL — ABNORMAL HIGH (ref 70–99)
Glucose-Capillary: 297 mg/dL — ABNORMAL HIGH (ref 70–99)
Glucose-Capillary: 302 mg/dL — ABNORMAL HIGH (ref 70–99)

## 2022-02-23 MED ORDER — INSULIN ASPART 100 UNIT/ML IJ SOLN
7.0000 [IU] | Freq: Once | INTRAMUSCULAR | Status: AC
  Administered 2022-02-24: 7 [IU] via SUBCUTANEOUS

## 2022-02-23 NOTE — Progress Notes (Signed)
Dr Claria Dice ordered to give insulin once please kindly see [facility]

## 2022-02-23 NOTE — Progress Notes (Signed)
Occupational Therapy Treatment Patient Details Name: William Acevedo MRN: [id number] DOB: [birth year] Today's Date: 02/23/2022   History of present illness Pt is a 67 y.o. M who presents 02/17/2022 with increased lethargy, vomiting, decreased intake x 3 days. MRI with 1. Acute to early subacute infarct in the medial left frontal lobe,  correlating with the left ACA territory and ACA-PCA watershed. No  evidence of hemorrhagic transformation or significant mass effect. 2. Remote infarcts involving the bilateral occipital cortex, right  parietal cortex, and posterior left frontal lobe white matter. PMH includes: HTN, HLD, CVA, uncontrolled DM II, renal infarct, major depressive disorder, polysubstance abuse (cocaine and marijuana), multiple recent admissions for abdominal pain, shingles, and multifocal acute infarcts.   OT comments  Pt more alert and participatory in adls today following more simple one step commands. Feeding goal added as pt is R handed but has less use of R hand but will not feed self with L hand (feel cognitively pt cannot switch to being L handed very easily and does try to feed self with R but struggles). Pt with poor carryover from previous sessions with safety concerns during adls at sink.  Pt with eyes open this entire session and verbalizing more as well.  Will continue to see for adls, increasing awareness of R side and feel pt would benefit from AIR to maximize independence with all self care.   Recommendations for follow up therapy are one component of a multi-disciplinary discharge planning process, led by the attending physician.  Recommendations may be updated based on patient status, additional functional criteria and insurance authorization.    Follow Up Recommendations  Acute inpatient rehab (3hours/day)     Assistance Recommended at Discharge Frequent or constant Supervision/Assistance  Patient can return home with the following  A lot of help with walking and/or  transfers;A little help with bathing/dressing/bathroom;Assistance with cooking/housework;Direct supervision/assist for medications management;Direct supervision/assist for financial management;Assist for transportation;Help with stairs or ramp for entrance   Equipment Recommendations       Recommendations for Other Services      Precautions / Restrictions Precautions Precautions: Fall Restrictions Weight Bearing Restrictions: No       Mobility Bed Mobility Overal bed mobility: Needs Assistance Bed Mobility: Supine to Sit     Supine to sit: Min assist     General bed mobility comments: Pt required physical assist to get RLE off the bed and trunk straight despite vcs provided.    Transfers Overall transfer level: Needs assistance Equipment used: 1 person hand held assist Transfers: Sit to/from Stand Sit to Stand: Min assist           General transfer comment: MinA to rise and steady     Balance Overall balance assessment: Needs assistance Sitting-balance support: Feet supported Sitting balance-Leahy Scale: Fair     Standing balance support: During functional activity, Bilateral upper extremity supported Standing balance-Leahy Scale: Poor Standing balance comment: up to modA                           ADL either performed or assessed with clinical judgement   ADL Overall ADL's : Needs assistance/impaired Eating/Feeding: Supervision/ safety;Sitting;Set up Eating/Feeding Details (indicate cue type and reason): Pt taking large bites requiring cues to slow down.  Pt used RUE to feed self oatmeal but would often "lose" his R hand under the table that he was eating off of and require cues to put the R hand back on  top of table near tray so he would remember to use it to feed self. Grooming: Wash/dry hands;Minimal assistance;Standing Grooming Details (indicate cue type and reason): Pt stood at sink to wash hands with slight posterior lean that pt as able to  assist in recovering from.  Pt's eyes open and pt engaging in task using BUE equally this session.                             Functional mobility during ADLs: Moderate assistance General ADL Comments: hand held assist walking to sink while pt had L hand on IV pole.  Pt very unsteady on his feet when walking to sink taking very small steps.    Extremity/Trunk Assessment Upper Extremity Assessment Upper Extremity Assessment: RUE deficits/detail RUE Deficits / Details: Shoulder 3/5, biceps/triceps 4-/5, grip 4-/5 RUE Sensation: decreased light touch RUE Coordination: decreased fine motor;decreased gross motor   Lower Extremity Assessment Lower Extremity Assessment: Defer to PT evaluation        Vision       Perception     Praxis      Cognition Arousal/Alertness: Awake/alert Behavior During Therapy: Flat affect Overall Cognitive Status: Impaired/Different from baseline Area of Impairment: Orientation, Attention, Memory, Following commands, Safety/judgement, Awareness, Problem solving                 Orientation Level: Disoriented to, Time, Situation Current Attention Level: Sustained Memory: Decreased short-term memory Following Commands: Follows one step commands inconsistently Safety/Judgement: Decreased awareness of safety, Decreased awareness of deficits Awareness: Intellectual Problem Solving: Slow processing, Decreased initiation, Requires verbal cues General Comments: Pt with slow processing during routine adls and little evidence of carryover form last session to this session although is more awake and alert this session and willing to do more.        Exercises      Shoulder Instructions       General Comments Pt more alert today and participatory in all adls and mobility.    Pertinent Vitals/ Pain       Pain Assessment Pain Assessment: No/denies pain  Home Living                                          Prior  Functioning/Environment              Frequency  Min 2X/week        Progress Toward Goals  OT Goals(current goals can now be found in the care plan section)  Progress towards OT goals: Progressing toward goals  Acute Rehab OT Goals Patient Stated Goal: none stated OT Goal Formulation: With patient Time For Goal Achievement: 03/04/22 Potential to Achieve Goals: Good ADL Goals Pt Will Perform Eating: with supervision;sitting Pt Will Perform Grooming: with modified independence;standing Pt Will Perform Lower Body Dressing: with modified independence;sit to/from stand Pt Will Transfer to Toilet: with modified independence;ambulating;regular height toilet Additional ADL Goal #1: Pt will follow three step commands to optimize independence in ADL and IADL.  Plan Discharge plan remains appropriate    Co-evaluation                 AM-PAC OT "6 Clicks" Daily Activity     Outcome Measure   Help from another person eating meals?: A Little Help from another person taking care of personal grooming?: A Little Help  from another person toileting, which includes using toliet, bedpan, or urinal?: A Lot Help from another person bathing (including washing, rinsing, drying)?: A Lot Help from another person to put on and taking off regular upper body clothing?: A Little Help from another person to put on and taking off regular lower body clothing?: A Lot 6 Click Score: 15    End of Session Equipment Utilized During Treatment: Gait belt  OT Visit Diagnosis: Unsteadiness on feet (R26.81);Other abnormalities of gait and mobility (R26.89);Muscle weakness (generalized) (M62.81);Low vision, both eyes (H54.2)   Activity Tolerance Patient tolerated treatment well   Patient Left in chair;with call bell/phone within reach;with nursing/sitter in room;with chair alarm set   Nurse Communication Mobility status        Time: 1030-1110 OT Time Calculation (min): 40 min  Charges: OT  General Charges $OT Visit: 1 Visit OT Treatments $Self Care/Home Management : 38-52 mins   Glenford Peers 02/23/2022, 11:24 AM

## 2022-02-23 NOTE — Progress Notes (Addendum)
Inpatient Rehab Admissions Coordinator:    I did receive insurance auth for CIR admit; however, I do not have a bed for him today. I will continue to follow for potential admit pending bed availability. I called and updated Pt.'s brother and confirmed that the plan will be for Pt. To participate in in an intensive rehab program for 14-16 days with Min assist to supervision goals for mobility and ADLs and Min assist to mod assist goals for communication and that he will discharge home with brother [name], for 24/7 support.   Clemens Catholic, [place], [place] Admissions Coordinator  [phone] ([place]) [phone] (office)

## 2022-02-23 NOTE — Plan of Care (Signed)
  Problem: Nutrition: Goal: Dietary intake will improve Outcome: Progressing   Problem: Fluid Volume: Goal: Ability to maintain a balanced intake and output will improve Outcome: Progressing   Problem: Education: Goal: Knowledge of disease or condition will improve Outcome: Not Progressing   Problem: Health Behavior/Discharge Planning: Goal: Ability to manage health-related needs will improve Outcome: Not Progressing   Problem: Education: Goal: Knowledge of General Education information will improve Description: Including pain rating scale, medication(s)/side effects and non-pharmacologic comfort measures Outcome: Not Progressing

## 2022-02-23 NOTE — Progress Notes (Signed)
PROGRESS NOTE    William Acevedo  QIW:[id number] DOB: Jan 16, 1956 DOA: 02/17/2022 PCP: [facility]   Brief Narrative:  67 y.o. male with medical history significant of  CAD s/p NSTEMI, hyperlipidemia, DMII insulin dependent, cerebral thrombosis with CVA, Essential hypertension, hx of polysubstance abuse , hx of endocarditis, hx of pulmonary cryptococosis, GERD, etoh abuse, Schizoaffectic disorder ,PTSD presented with increasing lethargy, vomiting and decreased oral intake.  He was found to have acute to early subacute ischemic infarct.  Neurology was consulted.  He was started on aspirin and Plavix.  PT/OT recommended CIR placement.  Currently medically stable for discharge to CIR.  Assessment & Plan:   Acute to early subacute ischemic infarct in the medial left frontal lobe correlating with left ACA territory and ACA-PCA watershed infarct/hypertensive emergency/acute encephalopathy -Has had history of prior CVA -Has had expressive intermittent aphasia: Improving slightly -Neurology recommended aspirin and Plavix for 3 weeks then Plavix alone and signed off. -LDL 217 and cholesterol 291: Currently on Lipitor 80 mg daily. -Recent A1c on 01/17/2022 was 10.6.  Bubble study echo showed EF of 55 to 60% with grade 1 diastolic dysfunction with no shunting -PT/OT recommended CIR.  Currently medically stable for discharge to CIR.  Hypertension -He was allowed for permissive hypertension initially. -Toprol-XL has subsequently been resumed.  Blood pressures currently on the lower side but stable  Diabetes mellitus type 2 with hyperglycemia and hypoglycemia -Continue long-acting insulin along with CBGs with SSI  History of CAD/non-STEMI -continue aspirin, statin, beta-blocker.  Currently stable.  Outpatient follow-up with cardiology  Schizoaffective disorder/PTSD -Continue current regimen.  Outpatient follow-up with psychiatry -Patient apparently does not have capacity to make  healthcare decisions for himself  History of polysubstance abuse -cocaine positive in UDS.  Also uses alcohol  Hypokalemia -Resolved  Acute kidney injury -Resolved  GERD -Continue PPI  DVT prophylaxis: Heparin subcutaneous Code Status: Full Family Communication: None at bedside Disposition Plan: Status is: Inpatient Remains inpatient appropriate because: Of severity of illness.  Need for CIR placement  Consultants: Neurology  Procedures: Echo  Antimicrobials: None   Subjective: Patient seen and examined at bedside.  Awake, slow to respond, poor historian.  No fever, seizures, vomiting reported.  Objective: Vitals:   02/23/22 0009 02/23/22 0012 02/23/22 0411 02/23/22 0745  BP: 109/62  111/66 (!) 97/54  Pulse: 71 78 66 69  Resp: 18  14 16   Temp: 97.6 F (36.4 C) 98.9 F (37.2 C) 98.9 F (37.2 C) 97.8 F (36.6 C)  TempSrc: Oral Oral Oral Oral  SpO2: 100% 97% 99% 96%  Weight:      Height:        Intake/Output Summary (Last 24 hours) at 02/23/2022 1020 Last data filed at 02/23/2022 0552 Gross per 24 hour  Intake --  Output 3200 ml  Net -3200 ml   Filed Weights   02/18/22 0525  Weight: 45.2 kg    Examination:  General exam: Appears calm and comfortable.  On room air.  Has some aphasia.  Slow to respond.  Poor historian. Respiratory system: Bilateral decreased breath sounds at bases with some scattered crackles Cardiovascular system: S1 & S2 heard, Rate controlled Gastrointestinal system: Abdomen is nondistended, soft and nontender. Normal bowel sounds heard. Extremities: No cyanosis, clubbing, edema    Data Reviewed: I have personally reviewed following labs and imaging studies  CBC: Recent Labs  Lab 02/17/22 1513 02/18/22 0652 02/19/22 0248  WBC 6.5 6.7 6.2  NEUTROABS 5.0  --  3.1  HGB  14.6 15.2 13.6  HCT 47.1 49.1 40.3  MCV 89.4 89.9 86.1  PLT 235 227 229   Basic Metabolic Panel: Recent Labs  Lab 02/17/22 1513 02/18/22 0652  02/19/22 0248 02/21/22 0824 02/21/22 1013  NA 141 144 141 138 136  K 4.1 4.0 3.4* 5.7* 4.1  CL 105 108 110 107 106  CO2 23 19* 23 18* 22  GLUCOSE 343* 208* 53* 214* 173*  BUN 29* 20 19 28* 22  CREATININE 1.07 1.02 0.70 1.42* 0.89  CALCIUM 8.7* 9.1 8.5* 8.3* 8.3*  MG  --   --   --  2.2  --    GFR: Estimated Creatinine Clearance: 50.8 mL/min (by C-G formula based on SCr of 0.89 mg/dL). Liver Function Tests: Recent Labs  Lab 02/17/22 1513 02/18/22 0652  AST 30 27  ALT 23 21  ALKPHOS 58 62  BILITOT 0.8 0.8  PROT 7.4 7.3  ALBUMIN 3.6 3.7   Recent Labs  Lab 02/17/22 2119  LIPASE 31   Recent Labs  Lab 02/17/22 1514  AMMONIA <10   Coagulation Profile: No results for input(s): "INR", "PROTIME" in the last 168 hours. Cardiac Enzymes: No results for input(s): "CKTOTAL", "CKMB", "CKMBINDEX", "TROPONINI" in the last 168 hours. BNP (last 3 results) No results for input(s): "PROBNP" in the last 8760 hours. HbA1C: No results for input(s): "HGBA1C" in the last 72 hours. CBG: Recent Labs  Lab 02/21/22 2113 02/22/22 1120 02/22/22 1647 02/22/22 2119 02/23/22 0614  GLUCAP 178* 223* 91 232* 124*   Lipid Profile: No results for input(s): "CHOL", "HDL", "LDLCALC", "TRIG", "CHOLHDL", "LDLDIRECT" in the last 72 hours. Thyroid Function Tests: No results for input(s): "TSH", "T4TOTAL", "FREET4", "T3FREE", "THYROIDAB" in the last 72 hours. Anemia Panel: No results for input(s): "VITAMINB12", "FOLATE", "FERRITIN", "TIBC", "IRON", "RETICCTPCT" in the last 72 hours. Sepsis Labs: Recent Labs  Lab 02/17/22 1514 02/17/22 2119 02/20/22 1021  LATICACIDVEN 2.0* 1.3 1.5    Recent Results (from the past 240 hour(s))  Resp panel by RT-PCR (RSV, Flu A&B, Covid) Anterior Nasal Swab     Status: None   Collection Time: 02/17/22  3:48 PM   Specimen: Anterior Nasal Swab  Result Value Ref Range Status   SARS Coronavirus 2 by RT PCR NEGATIVE NEGATIVE Final    Comment: (NOTE) SARS-CoV-2  target nucleic acids are NOT DETECTED.  The SARS-CoV-2 RNA is generally detectable in upper respiratory specimens during the acute phase of infection. The lowest concentration of SARS-CoV-2 viral copies this assay can detect is 138 copies/mL. A negative result does not preclude SARS-Cov-2 infection and should not be used as the sole basis for treatment or other patient management decisions. A negative result may occur with  improper specimen collection/handling, submission of specimen other than nasopharyngeal swab, presence of viral mutation(s) within the areas targeted by this assay, and inadequate number of viral copies(<138 copies/mL). A negative result must be combined with clinical observations, patient history, and epidemiological information. The expected result is Negative.  Fact Sheet for Patients:  EntrepreneurPulse.com.au  Fact Sheet for Healthcare Providers:  IncredibleEmployment.be  This test is no t yet approved or cleared by the [place] FDA and  has been authorized for detection and/or diagnosis of SARS-CoV-2 by FDA under an Emergency Use Authorization (EUA). This EUA will remain  in effect (meaning this test can be used) for the duration of the COVID-19 declaration under Section 564(b)(1) of the Act, 21 U.S.C.section 360bbb-3(b)(1), unless the authorization is terminated  or revoked sooner.  Influenza A by PCR NEGATIVE NEGATIVE Final   Influenza B by PCR NEGATIVE NEGATIVE Final    Comment: (NOTE) The Xpert Xpress SARS-CoV-2/FLU/RSV plus assay is intended as an aid in the diagnosis of influenza from Nasopharyngeal swab specimens and should not be used as a sole basis for treatment. Nasal washings and aspirates are unacceptable for Xpert Xpress SARS-CoV-2/FLU/RSV testing.  Fact Sheet for Patients: EntrepreneurPulse.com.au  Fact Sheet for Healthcare  Providers: IncredibleEmployment.be  This test is not yet approved or cleared by the [place] FDA and has been authorized for detection and/or diagnosis of SARS-CoV-2 by FDA under an Emergency Use Authorization (EUA). This EUA will remain in effect (meaning this test can be used) for the duration of the COVID-19 declaration under Section 564(b)(1) of the Act, 21 U.S.C. section 360bbb-3(b)(1), unless the authorization is terminated or revoked.     Resp Syncytial Virus by PCR NEGATIVE NEGATIVE Final    Comment: (NOTE) Fact Sheet for Patients: EntrepreneurPulse.com.au  Fact Sheet for Healthcare Providers: IncredibleEmployment.be  This test is not yet approved or cleared by the [place] FDA and has been authorized for detection and/or diagnosis of SARS-CoV-2 by FDA under an Emergency Use Authorization (EUA). This EUA will remain in effect (meaning this test can be used) for the duration of the COVID-19 declaration under Section 564(b)(1) of the Act, 21 U.S.C. section 360bbb-3(b)(1), unless the authorization is terminated or revoked.  Performed at [facility], [place] [address], [place], [place] [address]          Radiology Studies: No results found.      Scheduled Meds:  aspirin EC  81 mg Oral Daily   atorvastatin  80 mg Oral QHS   clopidogrel  75 mg Oral Daily   dapagliflozin propanediol  10 mg Oral Daily   DULoxetine  60 mg Oral Daily   famotidine  20 mg Oral QAC breakfast   feeding supplement (NEPRO CARB STEADY)  237 mL Oral TID WC   folic acid  1 mg Oral Daily   gabapentin  300 mg Oral BID   heparin  5,000 Units Subcutaneous Q8H   insulin aspart  0-9 Units Subcutaneous TID WC   insulin glargine-yfgn  10 Units Subcutaneous Daily   metoprolol succinate  25 mg Oral Daily   multivitamin with minerals  1 tablet Oral Daily   OLANZapine  5 mg Oral QHS   pantoprazole  40 mg Oral Daily    traZODone  100 mg Oral QHS   Continuous Infusions:        Aline August, MD [facility] 02/23/2022, 10:20 AM

## 2022-02-23 NOTE — Plan of Care (Signed)
  Problem: Education: Goal: Knowledge of disease or condition will improve Outcome: Progressing Goal: Knowledge of secondary prevention will improve (MUST DOCUMENT ALL) Outcome: Progressing Goal: Knowledge of patient specific risk factors will improve William Acevedo N/A or DELETE if not current risk factor) Outcome: Progressing   Problem: Ischemic Stroke/TIA Tissue Perfusion: Goal: Complications of ischemic stroke/TIA will be minimized Outcome: Progressing   Problem: Coping: Goal: Will verbalize positive feelings about self Outcome: Progressing Goal: Will identify appropriate support needs Outcome: Progressing   Problem: Health Behavior/Discharge Planning: Goal: Ability to manage health-related needs will improve Outcome: Progressing Goal: Goals will be collaboratively established with patient/family Outcome: Progressing   Problem: Self-Care: Goal: Ability to participate in self-care as condition permits will improve Outcome: Progressing Goal: Verbalization of feelings and concerns over difficulty with self-care will improve Outcome: Progressing Goal: Ability to communicate needs accurately will improve Outcome: Progressing   Problem: Nutrition: Goal: Dietary intake will improve Outcome: Progressing   Problem: Education: Goal: Ability to describe self-care measures that may prevent or decrease complications (Diabetes Survival Skills Education) will improve Outcome: Progressing Goal: Individualized Educational Video(s) Outcome: Progressing   Problem: Coping: Goal: Ability to adjust to condition or change in health will improve Outcome: Progressing   Problem: Fluid Volume: Goal: Ability to maintain a balanced intake and output will improve Outcome: Progressing   Problem: Health Behavior/Discharge Planning: Goal: Ability to identify and utilize available resources and services will improve Outcome: Progressing Goal: Ability to manage health-related needs will  improve Outcome: Progressing   Problem: Metabolic: Goal: Ability to maintain appropriate glucose levels will improve Outcome: Progressing   Problem: Nutritional: Goal: Maintenance of adequate nutrition will improve Outcome: Progressing Goal: Progress toward achieving an optimal weight will improve Outcome: Progressing   Problem: Skin Integrity: Goal: Risk for impaired skin integrity will decrease Outcome: Progressing   Problem: Tissue Perfusion: Goal: Adequacy of tissue perfusion will improve Outcome: Progressing   Problem: Education: Goal: Knowledge of General Education information will improve Description: Including pain rating scale, medication(s)/side effects and non-pharmacologic comfort measures Outcome: Progressing   Problem: Health Behavior/Discharge Planning: Goal: Ability to manage health-related needs will improve Outcome: Progressing   Problem: Clinical Measurements: Goal: Ability to maintain clinical measurements within normal limits will improve Outcome: Progressing Goal: Will remain free from infection Outcome: Progressing Goal: Diagnostic test results will improve Outcome: Progressing Goal: Respiratory complications will improve Outcome: Progressing Goal: Cardiovascular complication will be avoided Outcome: Progressing   Problem: Activity: Goal: Risk for activity intolerance will decrease Outcome: Progressing   Problem: Nutrition: Goal: Adequate nutrition will be maintained Outcome: Progressing   Problem: Coping: Goal: Level of anxiety will decrease Outcome: Progressing   Problem: Pain Managment: Goal: General experience of comfort will improve Outcome: Progressing   Problem: Safety: Goal: Ability to remain free from injury will improve Outcome: Progressing   Problem: Skin Integrity: Goal: Risk for impaired skin integrity will decrease Outcome: Progressing   Problem: Nutrition: Goal: Risk of aspiration will decrease Outcome:  Completed/Met   Problem: Elimination: Goal: Will not experience complications related to bowel motility Outcome: Completed/Met Goal: Will not experience complications related to urinary retention Outcome: Completed/Met   Problem: Safety: Goal: Non-violent Restraint(s) Outcome: Not Applicable

## 2022-02-23 NOTE — Progress Notes (Signed)
Mobility Specialist: Progress Note   02/23/22 1618  Mobility  Activity Ambulated with assistance in hallway  Level of Assistance Minimal assist, patient does 75% or more  Assistive Device Front wheel walker  Distance Ambulated (ft) 150 ft  Activity Response Tolerated well  Mobility Referral Yes  $Mobility charge 1 Mobility   Pt received in the bed and agreeable to mobility. Assisted pt with pericare with help from RN as pt had BM in the bed. Once finished pt minA with bed mobility as well as to stand from EOB. Mild posterior lean upon standing, resolved when ambulating. Verbal cues for "big steps" and physical assist for RLE advancement as pt has decreased dorsiflexion with fatigue. No c/o throughout. Pt back to bed after session with call bell and phone at his side. Bed alarm is on.   [place] Alguire Mobility Specialist Please contact via SecureChat or Rehab office at [phone]

## 2022-02-23 NOTE — Progress Notes (Signed)
Referred pt to Dr Claria Dice, pt latest bld sugar of 302 mg/dl and it was 292 mg/dl at 1615H, insulin coverage can be given only  3 times a day with meal and insulin glargine was last given at 1000H

## 2022-02-24 ENCOUNTER — Encounter (HOSPITAL_COMMUNITY): Payer: Self-pay | Admitting: Internal Medicine

## 2022-02-24 ENCOUNTER — Inpatient Hospital Stay (HOSPITAL_COMMUNITY)
Admission: RE | Admit: 2022-02-24 | Discharge: 2022-03-04 | DRG: 057 | Disposition: A | Discharge status: Home Health | Payer: No Typology Code available for payment source | Source: Intra-hospital | Attending: Physical Medicine and Rehabilitation | Admitting: Physical Medicine and Rehabilitation

## 2022-02-24 DIAGNOSIS — I69319 Unspecified symptoms and signs involving cognitive functions following cerebral infarction: Secondary | ICD-10-CM

## 2022-02-24 DIAGNOSIS — R636 Underweight: Secondary | ICD-10-CM | POA: Diagnosis present

## 2022-02-24 DIAGNOSIS — I63522 Cerebral infarction due to unspecified occlusion or stenosis of left anterior cerebral artery: Secondary | ICD-10-CM

## 2022-02-24 DIAGNOSIS — K5900 Constipation, unspecified: Secondary | ICD-10-CM | POA: Diagnosis not present

## 2022-02-24 DIAGNOSIS — R4701 Aphasia: Secondary | ICD-10-CM | POA: Diagnosis not present

## 2022-02-24 DIAGNOSIS — I252 Old myocardial infarction: Secondary | ICD-10-CM | POA: Diagnosis not present

## 2022-02-24 DIAGNOSIS — I214 Non-ST elevation (NSTEMI) myocardial infarction: Secondary | ICD-10-CM | POA: Diagnosis not present

## 2022-02-24 DIAGNOSIS — I639 Cerebral infarction, unspecified: Secondary | ICD-10-CM | POA: Diagnosis not present

## 2022-02-24 DIAGNOSIS — I2510 Atherosclerotic heart disease of native coronary artery without angina pectoris: Secondary | ICD-10-CM | POA: Diagnosis present

## 2022-02-24 DIAGNOSIS — Z7982 Long term (current) use of aspirin: Secondary | ICD-10-CM

## 2022-02-24 DIAGNOSIS — G8929 Other chronic pain: Secondary | ICD-10-CM | POA: Diagnosis present

## 2022-02-24 DIAGNOSIS — Z7902 Long term (current) use of antithrombotics/antiplatelets: Secondary | ICD-10-CM

## 2022-02-24 DIAGNOSIS — F1220 Cannabis dependence, uncomplicated: Secondary | ICD-10-CM | POA: Diagnosis present

## 2022-02-24 DIAGNOSIS — F259 Schizoaffective disorder, unspecified: Secondary | ICD-10-CM | POA: Diagnosis present

## 2022-02-24 DIAGNOSIS — F4312 Post-traumatic stress disorder, chronic: Secondary | ICD-10-CM | POA: Diagnosis present

## 2022-02-24 DIAGNOSIS — I6389 Other cerebral infarction: Secondary | ICD-10-CM | POA: Diagnosis not present

## 2022-02-24 DIAGNOSIS — Z681 Body mass index (BMI) 19 or less, adult: Secondary | ICD-10-CM | POA: Diagnosis not present

## 2022-02-24 DIAGNOSIS — Z7984 Long term (current) use of oral hypoglycemic drugs: Secondary | ICD-10-CM

## 2022-02-24 DIAGNOSIS — F250 Schizoaffective disorder, bipolar type: Secondary | ICD-10-CM | POA: Diagnosis not present

## 2022-02-24 DIAGNOSIS — E119 Type 2 diabetes mellitus without complications: Secondary | ICD-10-CM

## 2022-02-24 DIAGNOSIS — I69334 Monoplegia of upper limb following cerebral infarction affecting left non-dominant side: Secondary | ICD-10-CM

## 2022-02-24 DIAGNOSIS — I10 Essential (primary) hypertension: Secondary | ICD-10-CM | POA: Diagnosis not present

## 2022-02-24 DIAGNOSIS — R109 Unspecified abdominal pain: Secondary | ICD-10-CM | POA: Diagnosis present

## 2022-02-24 DIAGNOSIS — I69351 Hemiplegia and hemiparesis following cerebral infarction affecting right dominant side: Principal | ICD-10-CM

## 2022-02-24 DIAGNOSIS — E785 Hyperlipidemia, unspecified: Secondary | ICD-10-CM | POA: Diagnosis present

## 2022-02-24 DIAGNOSIS — F32A Depression, unspecified: Secondary | ICD-10-CM | POA: Diagnosis present

## 2022-02-24 DIAGNOSIS — F17210 Nicotine dependence, cigarettes, uncomplicated: Secondary | ICD-10-CM | POA: Diagnosis present

## 2022-02-24 DIAGNOSIS — K219 Gastro-esophageal reflux disease without esophagitis: Secondary | ICD-10-CM | POA: Diagnosis present

## 2022-02-24 DIAGNOSIS — I69320 Aphasia following cerebral infarction: Secondary | ICD-10-CM

## 2022-02-24 DIAGNOSIS — G8191 Hemiplegia, unspecified affecting right dominant side: Secondary | ICD-10-CM | POA: Diagnosis not present

## 2022-02-24 DIAGNOSIS — F1910 Other psychoactive substance abuse, uncomplicated: Secondary | ICD-10-CM | POA: Diagnosis not present

## 2022-02-24 DIAGNOSIS — E1140 Type 2 diabetes mellitus with diabetic neuropathy, unspecified: Secondary | ICD-10-CM | POA: Diagnosis present

## 2022-02-24 DIAGNOSIS — Z79899 Other long term (current) drug therapy: Secondary | ICD-10-CM

## 2022-02-24 DIAGNOSIS — I69398 Other sequelae of cerebral infarction: Secondary | ICD-10-CM | POA: Diagnosis not present

## 2022-02-24 DIAGNOSIS — R770 Abnormality of albumin: Secondary | ICD-10-CM | POA: Diagnosis not present

## 2022-02-24 DIAGNOSIS — F1410 Cocaine abuse, uncomplicated: Secondary | ICD-10-CM | POA: Diagnosis present

## 2022-02-24 DIAGNOSIS — Z888 Allergy status to other drugs, medicaments and biological substances status: Secondary | ICD-10-CM

## 2022-02-24 DIAGNOSIS — I69312 Visuospatial deficit and spatial neglect following cerebral infarction: Secondary | ICD-10-CM

## 2022-02-24 DIAGNOSIS — Z794 Long term (current) use of insulin: Secondary | ICD-10-CM | POA: Diagnosis not present

## 2022-02-24 DIAGNOSIS — I959 Hypotension, unspecified: Secondary | ICD-10-CM | POA: Diagnosis not present

## 2022-02-24 DIAGNOSIS — E11649 Type 2 diabetes mellitus with hypoglycemia without coma: Secondary | ICD-10-CM | POA: Diagnosis not present

## 2022-02-24 DIAGNOSIS — G9340 Encephalopathy, unspecified: Secondary | ICD-10-CM | POA: Diagnosis not present

## 2022-02-24 DIAGNOSIS — Z79891 Long term (current) use of opiate analgesic: Secondary | ICD-10-CM

## 2022-02-24 DIAGNOSIS — I6330 Cerebral infarction due to thrombosis of unspecified cerebral artery: Secondary | ICD-10-CM

## 2022-02-24 LAB — GLUCOSE, CAPILLARY
Glucose-Capillary: 181 mg/dL — ABNORMAL HIGH (ref 70–99)
Glucose-Capillary: 280 mg/dL — ABNORMAL HIGH (ref 70–99)
Glucose-Capillary: 341 mg/dL — ABNORMAL HIGH (ref 70–99)
Glucose-Capillary: 303 mg/dL — ABNORMAL HIGH (ref 70–99)
Glucose-Capillary: 175 mg/dL — ABNORMAL HIGH (ref 70–99)

## 2022-02-24 MED ORDER — DULOXETINE HCL 60 MG PO CPEP
60.0000 mg | ORAL_CAPSULE | Freq: Every day | ORAL | Status: DC
  Administered 2022-02-25 – 2022-03-04 (×8): 60 mg via ORAL
  Filled 2022-02-24 (×8): qty 1

## 2022-02-24 MED ORDER — INSULIN ASPART 100 UNIT/ML IJ SOLN: 5.0000 [IU] | Freq: Three times a day (TID) | INTRAMUSCULAR | Status: DC

## 2022-02-24 MED ORDER — ASPIRIN 81 MG PO TBEC
81.0000 mg | DELAYED_RELEASE_TABLET | Freq: Every day | ORAL | Status: DC
  Administered 2022-02-25 – 2022-03-04 (×8): 81 mg via ORAL
  Filled 2022-02-24 (×8): qty 1

## 2022-02-24 MED ORDER — NEPRO/CARBSTEADY PO LIQD: 237.0000 mL | Freq: Three times a day (TID) | ORAL | Status: DC

## 2022-02-24 MED ORDER — PROCHLORPERAZINE EDISYLATE 10 MG/2ML IJ SOLN: 5.0000 mg | Freq: Four times a day (QID) | INTRAMUSCULAR | Status: DC | PRN

## 2022-02-24 MED ORDER — ENOXAPARIN SODIUM 40 MG/0.4ML IJ SOSY
40.0000 mg | PREFILLED_SYRINGE | INTRAMUSCULAR | Status: DC
  Administered 2022-02-24 – 2022-03-03 (×6): 40 mg via SUBCUTANEOUS
  Filled 2022-02-24 (×8): qty 0.4

## 2022-02-24 MED ORDER — ADULT MULTIVITAMIN W/MINERALS CH
1.0000 | ORAL_TABLET | Freq: Every day | ORAL | Status: DC
  Administered 2022-02-25 – 2022-03-04 (×8): 1 via ORAL
  Filled 2022-02-24 (×8): qty 1

## 2022-02-24 MED ORDER — ACETAMINOPHEN 325 MG PO TABS
325.0000 mg | ORAL_TABLET | ORAL | Status: DC | PRN
  Administered 2022-02-25: 650 mg via ORAL
  Filled 2022-02-24: qty 2

## 2022-02-24 MED ORDER — INFLUENZA VAC A&B SA ADJ QUAD 0.5 ML IM PRSY
0.5000 mL | PREFILLED_SYRINGE | INTRAMUSCULAR | Status: DC
  Filled 2022-02-24: qty 0.5

## 2022-02-24 MED ORDER — FAMOTIDINE 20 MG PO TABS
20.0000 mg | ORAL_TABLET | Freq: Every day | ORAL | Status: DC
  Administered 2022-02-25 – 2022-03-04 (×7): 20 mg via ORAL
  Filled 2022-02-24 (×9): qty 1

## 2022-02-24 MED ORDER — INSULIN ASPART 100 UNIT/ML IJ SOLN
0.0000 [IU] | Freq: Three times a day (TID) | INTRAMUSCULAR | Status: DC
  Administered 2022-02-25 (×3): 2 [IU] via SUBCUTANEOUS
  Administered 2022-02-26: 3 [IU] via SUBCUTANEOUS
  Administered 2022-02-26: 2 [IU] via SUBCUTANEOUS
  Administered 2022-02-26: 1 [IU] via SUBCUTANEOUS
  Administered 2022-02-27: 5 [IU] via SUBCUTANEOUS

## 2022-02-24 MED ORDER — ALUM & MAG HYDROXIDE-SIMETH 200-200-20 MG/5ML PO SUSP: 30.0000 mL | ORAL | Status: DC | PRN

## 2022-02-24 MED ORDER — INSULIN GLARGINE-YFGN 100 UNIT/ML ~~LOC~~ SOLN
15.0000 [IU] | Freq: Two times a day (BID) | SUBCUTANEOUS | Status: DC
  Administered 2022-02-24 – 2022-02-26 (×4): 15 [IU] via SUBCUTANEOUS
  Filled 2022-02-24 (×6): qty 0.15

## 2022-02-24 MED ORDER — BISACODYL 10 MG RE SUPP: 10.0000 mg | Freq: Every day | RECTAL | Status: DC | PRN

## 2022-02-24 MED ORDER — POLYETHYLENE GLYCOL 3350 17 G PO PACK
17.0000 g | PACK | Freq: Every day | ORAL | Status: DC | PRN
  Administered 2022-02-26: 17 g via ORAL
  Filled 2022-02-24: qty 1

## 2022-02-24 MED ORDER — PROCHLORPERAZINE MALEATE 5 MG PO TABS: 5.0000 mg | ORAL_TABLET | Freq: Four times a day (QID) | ORAL | Status: DC | PRN

## 2022-02-24 MED ORDER — GUAIFENESIN-DM 100-10 MG/5ML PO SYRP: 5.0000 mL | ORAL_SOLUTION | Freq: Four times a day (QID) | ORAL | Status: DC | PRN

## 2022-02-24 MED ORDER — FLEET ENEMA 7-19 GM/118ML RE ENEM: 1.0000 | ENEMA | Freq: Once | RECTAL | Status: DC | PRN

## 2022-02-24 MED ORDER — PANTOPRAZOLE SODIUM 40 MG PO TBEC
40.0000 mg | DELAYED_RELEASE_TABLET | Freq: Every day | ORAL | Status: DC
  Administered 2022-02-25: 40 mg via ORAL
  Filled 2022-02-24: qty 1

## 2022-02-24 MED ORDER — PROCHLORPERAZINE 25 MG RE SUPP: 12.5000 mg | Freq: Four times a day (QID) | RECTAL | Status: DC | PRN

## 2022-02-24 MED ORDER — OLANZAPINE 5 MG PO TABS
5.0000 mg | ORAL_TABLET | Freq: Every day | ORAL | Status: DC
  Administered 2022-02-24 – 2022-03-03 (×8): 5 mg via ORAL
  Filled 2022-02-24 (×9): qty 1

## 2022-02-24 MED ORDER — ORAL CARE MOUTH RINSE: 15.0000 mL | OROMUCOSAL | Status: DC | PRN

## 2022-02-24 MED ORDER — GABAPENTIN 300 MG PO CAPS
300.0000 mg | ORAL_CAPSULE | Freq: Two times a day (BID) | ORAL | Status: DC
  Administered 2022-02-24 – 2022-03-04 (×16): 300 mg via ORAL
  Filled 2022-02-24 (×16): qty 1

## 2022-02-24 MED ORDER — ATORVASTATIN CALCIUM 80 MG PO TABS
80.0000 mg | ORAL_TABLET | Freq: Every day | ORAL | Status: DC
  Administered 2022-02-24 – 2022-03-02 (×7): 80 mg via ORAL
  Filled 2022-02-24 (×8): qty 1

## 2022-02-24 MED ORDER — MELATONIN 3 MG PO TABS: 6.0000 mg | ORAL_TABLET | Freq: Every evening | ORAL | Status: DC | PRN

## 2022-02-24 MED ORDER — FOLIC ACID 1 MG PO TABS
1.0000 mg | ORAL_TABLET | Freq: Every day | ORAL | Status: DC
  Administered 2022-02-25 – 2022-03-01 (×5): 1 mg via ORAL
  Filled 2022-02-24 (×6): qty 1

## 2022-02-24 MED ORDER — CLOPIDOGREL BISULFATE 75 MG PO TABS
75.0000 mg | ORAL_TABLET | Freq: Every day | ORAL | Status: DC
  Administered 2022-02-25 – 2022-03-04 (×8): 75 mg via ORAL
  Filled 2022-02-24 (×8): qty 1

## 2022-02-24 MED ORDER — INSULIN ASPART 100 UNIT/ML IJ SOLN
0.0000 [IU] | Freq: Every day | INTRAMUSCULAR | Status: DC
  Administered 2022-02-26: 3 [IU] via SUBCUTANEOUS
  Administered 2022-02-27 – 2022-03-01 (×2): 2 [IU] via SUBCUTANEOUS

## 2022-02-24 MED ORDER — TRAZODONE HCL 50 MG PO TABS
100.0000 mg | ORAL_TABLET | Freq: Every day | ORAL | Status: DC
  Administered 2022-02-24 – 2022-03-03 (×8): 100 mg via ORAL
  Filled 2022-02-24 (×8): qty 2

## 2022-02-24 MED ORDER — DIPHENHYDRAMINE HCL 12.5 MG/5ML PO ELIX: 12.5000 mg | ORAL_SOLUTION | Freq: Four times a day (QID) | ORAL | Status: DC | PRN

## 2022-02-24 MED ORDER — INSULIN GLARGINE-YFGN 100 UNIT/ML ~~LOC~~ SOLN
15.0000 [IU] | Freq: Two times a day (BID) | SUBCUTANEOUS | Status: DC
  Administered 2022-02-24: 15 [IU] via SUBCUTANEOUS
  Filled 2022-02-24 (×2): qty 0.15

## 2022-02-24 MED ORDER — SENNOSIDES-DOCUSATE SODIUM 8.6-50 MG PO TABS
1.0000 | ORAL_TABLET | Freq: Every evening | ORAL | Status: DC | PRN
  Administered 2022-03-02: 1 via ORAL
  Filled 2022-02-24: qty 1

## 2022-02-24 MED ORDER — INSULIN GLARGINE-YFGN 100 UNIT/ML ~~LOC~~ SOLN: 15.0000 [IU] | Freq: Every day | SUBCUTANEOUS | Status: DC

## 2022-02-24 MED ORDER — DAPAGLIFLOZIN PROPANEDIOL 10 MG PO TABS
10.0000 mg | ORAL_TABLET | Freq: Every day | ORAL | Status: DC
  Administered 2022-02-25 – 2022-03-04 (×8): 10 mg via ORAL
  Filled 2022-02-24 (×9): qty 1

## 2022-02-24 MED ORDER — ATORVASTATIN CALCIUM 80 MG PO TABS: 80.0000 mg | ORAL_TABLET | Freq: Every day | ORAL | Status: DC

## 2022-02-24 MED ORDER — METOPROLOL SUCCINATE ER 25 MG PO TB24
25.0000 mg | ORAL_TABLET | Freq: Every day | ORAL | Status: DC
  Administered 2022-02-25: 25 mg via ORAL
  Filled 2022-02-24: qty 1

## 2022-02-24 NOTE — Progress Notes (Addendum)
PMR Admission Coordinator Pre-Admission Assessment   Patient: William Acevedo is an 67 y.o., male MRN: [id number] DOB: Sep 12, 1955 Height: 5' 6"$  (1.676 m) Weight: 45.2 kg (per RN Amy)   Insurance Information HMO:     PPO:      PCP:      IPA:      80/20:      OTHER:  PRIMARY: [facility]      Policy#: [id number]       Subscriber: Pt CM Name: William Acevedo      Phone#:  [phone] ext. [phone]     Fax#:   [phone] Pre-Cert#: [id number]      Employer:  Received an email from William Acevedo 2/8 stating Pt. Approved for 30 days, Please contact her with date and time of admit and she will give the auth number and dates Benefits:  Phone #:      Name:  Eff. Date:  02/21/22 active     Deduct:       Out of Pocket Max:       Life Max:  CIR:  100% if approved     SNF:  Outpatient:      Co-Pay:  Home Health:       Co-Pay:  DME:      Co-Pay:  Providers: In network   SECONDARY: None   Development worker, community:       Phone#:    The Engineer, petroleum" for patients in Inpatient Rehabilitation Facilities with attached "Privacy Act [place] Records" was provided and verbally reviewed with: Family   Emergency Contact Information Contact Information       Name Relation Home Work Mobile    Sharon Brother [phone]   [phone]           Current Medical History  Patient Admitting Diagnosis: L CVA   History of Present Illness: William Acevedo is a 67 y.o. male with medical history significant of CAD s/p NSTEMI, hyperlipedemia, DMII insulin dependent, cerebral thrombosis with CVA, Essential hypertension, polysubstance abuse,  endocarditis, pulmonary cryptococosis, GERD, etoh abuse, Schizoaffectic disorder  and PTSD who presents to [facility] ED 02/17/22 with increased lethargy vomiting,  decreased intake x 3 days  and difficulty communicating.  In the ED Pt. Vitals and chemistries as follows: Afebrile, BP 188/89, HR 103, rr 20, saturating at   99% on RA;  Na 141, K 4.1, glu 343, cr 1.07,  Wbc 6.5, hgb 14.6, plt 235, Lactic acid 2, repeat 1.3.  [id number] tachycardia,  UA negative, UDS positive for  cocaine. CXR clear. CTH with Extensive, multifocal bilateral encephalomalacia, some of which appears to be new or at least increased compared to prior examination dated 07/09/2020 although is generally age indeterminate. MRI of the head showed  cute to early subacute infarct in the medial left frontal lobe, correlating with the left ACA territory and ACA-PCA watershed. No evidence of hemorrhagic transformation or significant mass effect as well as remote infarcts involving the bilateral occipital cortex, right parietal cortex, and posterior left frontal lobe white matter. CTAB with no  acute findings in the abdomen pelvis, bilateral nonobstructing renal calculi and aortic atherosclerosis.CTA with  left A2 segment occlusion, corresponding to the infarct seen on concomitant brain MRI and severe stenosis of the proximal cavernous segment of the left ICA. Pt. Seen by PT/OT/SLP and they recommend CIR to assist return to PLOF.    Complete NIHSS TOTAL: 5   Patient's medical record from [facility] has  been reviewed by the rehabilitation admission coordinator and physician.   Past Medical History      Past Medical History:  Diagnosis Date   Carpal tunnel syndrome     Depression     Diabetes mellitus without complication ([place])     Hypertensive urgency 04/14/2020   Neuropathy        Has the patient had major surgery during 100 days prior to admission? No   Family History   family history is not on file.   Current Medications   Current Facility-Administered Medications:    acetaminophen (TYLENOL) tablet 650 mg, 650 mg, Oral, Q4H PRN **OR** acetaminophen (TYLENOL) 160 MG/5ML solution 650 mg, 650 mg, Per Tube, Q4H PRN **OR** acetaminophen (TYLENOL) suppository 650 mg, 650 mg, Rectal, Q4H PRN, Clance Boll, MD   aspirin EC  tablet 81 mg, 81 mg, Oral, Daily, Myles Rosenthal A, MD, 81 mg at 02/24/22 [id number]   atorvastatin (LIPITOR) tablet 80 mg, 80 mg, Oral, QHS, Lehner, Erin C, NP, 80 mg at 02/23/22 2133   clopidogrel (PLAVIX) tablet 75 mg, 75 mg, Oral, Daily, Myles Rosenthal A, MD, 75 mg at 02/24/22 [id number]   dapagliflozin propanediol (FARXIGA) tablet 10 mg, 10 mg, Oral, Daily, Pahwani, Ravi, MD, 10 mg at 02/24/22 [id number]   DULoxetine (CYMBALTA) DR capsule 60 mg, 60 mg, Oral, Daily, Pahwani, Ravi, MD, 60 mg at 02/24/22 0832   famotidine (PEPCID) tablet 20 mg, 20 mg, Oral, QAC breakfast, Pahwani, Ravi, MD, 20 mg at 02/24/22 0751   feeding supplement (NEPRO CARB STEADY) liquid 237 mL, 237 mL, Oral, TID WC, Pahwani, Ravi, MD, 237 mL at [id number]   folic acid (FOLVITE) tablet 1 mg, 1 mg, Oral, Daily, Pahwani, Ravi, MD, 1 mg at 02/24/22 [id number]   gabapentin (NEURONTIN) capsule 300 mg, 300 mg, Oral, BID, Pahwani, Ravi, MD, 300 mg at 02/24/22 0832   heparin injection 5,000 Units, 5,000 Units, Subcutaneous, Q8H, Myles Rosenthal A, MD, 5,000 Units at 02/24/22 1408   hydrALAZINE (APRESOLINE) injection 10 mg, 10 mg, Intravenous, Q4H PRN, Pahwani, Ravi, MD, 10 mg at 02/18/22 1629   insulin aspart (novoLOG) injection 0-9 Units, 0-9 Units, Subcutaneous, TID WC, Myles Rosenthal A, MD, 5 Units at 02/24/22 1149   insulin glargine-yfgn (SEMGLEE) injection 15 Units, 15 Units, Subcutaneous, BID, Alekh, Kshitiz, MD, 15 Units at 02/24/22 0851   melatonin tablet 6 mg, 6 mg, Oral, QHS PRN, Darliss Cheney, MD   metoprolol succinate (TOPROL-XL) 24 hr tablet 25 mg, 25 mg, Oral, Daily, Pahwani, Ravi, MD, 25 mg at 02/24/22 [id number]   multivitamin with minerals tablet 1 tablet, 1 tablet, Oral, Daily, Pahwani, Ravi, MD, 1 tablet at 02/24/22 0832   OLANZapine (ZYPREXA) tablet 5 mg, 5 mg, Oral, QHS, Pahwani, Ravi, MD, 5 mg at 02/23/22 2133   Oral care mouth rinse, 15 mL, Mouth Rinse, PRN, Pahwani, Ravi, MD   pantoprazole (PROTONIX) EC tablet 40 mg, 40 mg,  Oral, Daily, Hammons, Kimberly B, RPH, 40 mg at 02/24/22 [id number]   senna-docusate (Senokot-S) tablet 1 tablet, 1 tablet, Oral, QHS PRN, Clance Boll, MD, 1 tablet at 02/22/22 2102   traZODone (DESYREL) tablet 100 mg, 100 mg, Oral, QHS, Pahwani, Ravi, MD, 100 mg at 02/23/22 2133   Patients Current Diet:  Diet Order                  Diet - low sodium heart healthy             Diet Carb Modified  Diet Carb Modified Fluid consistency: Thin; Room service appropriate? Yes  Diet effective now                         Precautions / Restrictions Precautions Precautions: Fall Precaution Comments: wrist restraints Restrictions Weight Bearing Restrictions: No    Has the patient had 2 or more falls or a fall with injury in the past year? Yes   Prior Activity Level Community (5-7x/wk): Pt went out 2+ a week,for appts and errands   Prior Functional Level Self Care: Did the patient need help bathing, dressing, using the toilet or eating? Needed some help   Indoor Mobility: Did the patient need assistance with walking from room to room (with or without device)? Independent   Stairs: Did the patient need assistance with internal or external stairs (with or without device)? Needed some help   Functional Cognition: Did the patient need help planning regular tasks such as shopping or remembering to take medications? Needed some help   Patient Information Are you of Hispanic, Latino/a,or Spanish origin?: No, not of Hispanic, Latino, or Spanish origin. What is your race?: B. Black or African American Do you need or want an interpreter to communicate with a doctor or health care staff?: No   Patient's Response To:  Health Literacy and Transportation Is the patient able to respond to health literacy and transportation needs?: Yes Health Literacy - How often do you need to have someone help you when you read instructions, pamphlets, or other written material from your doctor or  pharmacy?: Always In the past 12 months, has lack of transportation kept you from medical appointments or from getting medications?: No  In the past 12 months, has lack of transportation kept you from meetings, work, or from getting things needed for daily living?: No   Development worker, international aid / Equipment Home Equipment: BSC/3in1, Civil engineer, contracting, Radio producer - single point, Crutches, Wheelchair - manual   Prior Device Use: Indicate devices/aids used by the patient prior to current illness, exacerbation or injury? Walker   Current Functional Level Cognition   Overall Cognitive Status: No family/caregiver present to determine baseline cognitive functioning Difficult to assess due to: Impaired communication Current Attention Level: Sustained Orientation Level: Oriented to person, Oriented to place, Disoriented to time, Disoriented to situation Following Commands: Follows one step commands inconsistently Safety/Judgement: Decreased awareness of safety, Decreased awareness of deficits General Comments: Pt with slow processing during functional mobility    Extremity Assessment (includes Sensation/Coordination)   Upper Extremity Assessment: RUE deficits/detail RUE Deficits / Details: Shoulder 3/5, biceps/triceps 4-/5, grip 4-/5 RUE Sensation: decreased light touch RUE Coordination: decreased fine motor, decreased gross motor  Lower Extremity Assessment: Defer to PT evaluation RLE Deficits / Details: ROM WFL LLE Deficits / Details: ROM WFL     ADLs   Overall ADL's : Needs assistance/impaired Eating/Feeding: Supervision/ safety, Sitting, Set up Eating/Feeding Details (indicate cue type and reason): Pt taking large bites requiring cues to slow down.  Pt used RUE to feed self oatmeal but would often "lose" his R hand under the table that he was eating off of and require cues to put the R hand back on top of table near tray so he would remember to use it to feed self. Grooming: Wash/dry hands, Minimal  assistance, Standing Grooming Details (indicate cue type and reason): Pt stood at sink to wash hands with slight posterior lean that pt as able to assist in recovering from.  Pt's eyes  open and pt engaging in task using BUE equally this session. Upper Body Bathing: Set up, Sitting Lower Body Bathing: Minimal assistance, Sit to/from stand Upper Body Dressing : Set up, Sitting Lower Body Dressing: Moderate assistance, Sit to/from stand, Cueing for sequencing, Cueing for compensatory techniques Lower Body Dressing Details (indicate cue type and reason): Pt unable to donn socks on EOB without mod assist. Pt with poor sitting balance and required assist to turn to get leg on bed to attempt to donn them. Toilet Transfer: Minimal assistance, Ambulation Toilet Transfer Details (indicate cue type and reason): 1 person hand held assist Toileting- Clothing Manipulation and Hygiene: Min guard, Sit to/from stand, Cueing for sequencing Toileting - Clothing Manipulation Details (indicate cue type and reason): Pt cleaning self up at toilet. Walking to sink and perforing grooming tasks. Then lifting gown and urinating on floor standing by sink. Appeared to be behavioral in nature Functional mobility during ADLs: Moderate assistance General ADL Comments: hand held assist walking to sink while pt had L hand on IV pole.  Pt very unsteady on his feet when walking to sink taking very small steps.     Mobility   Overal bed mobility: Needs Assistance Bed Mobility: Supine to Sit Supine to sit: Min assist Sit to supine: Min assist General bed mobility comments: Pt requires Min A for bil LE for sitting to supine. Pt requires Min verbal cues for sequencing and initiation for going for supine to sitting EOB with significant increase in time.     Transfers   Overall transfer level: Needs assistance Equipment used: Rolling walker (2 wheels) Transfers: Sit to/from Stand Sit to Stand: Min guard General transfer comment:  Verbal cues for sit to stand for safe hand placement     Ambulation / Gait / Stairs / Wheelchair Mobility   Ambulation/Gait Ambulation/Gait assistance: Mod assist Gait Distance (Feet): 20 Feet Assistive device: Rolling walker (2 wheels) Gait Pattern/deviations: Step-to pattern, Decreased stance time - right, Decreased weight shift to right, Decreased step length - right, Decreased dorsiflexion - right General Gait Details: Pt requires moderate verbal cues for correct sequencing with AD and with stepping including taking a large enough step with the RLE. Pt had 1x LOB requiring Mod A to prevent fall posterior to the R. When asked what happened pt stated " You almost let me fall." Pt has difficulty progressing the RLE which results in trailing. Gait velocity: Decreased cadence. Gait velocity interpretation: <1.31 ft/sec, indicative of household ambulator Stairs:  (not performed today due to current functional status.)     Posture / Balance Balance Overall balance assessment: Needs assistance Sitting-balance support: Feet supported Sitting balance-Leahy Scale: Fair Standing balance support: During functional activity, Bilateral upper extremity supported Standing balance-Leahy Scale: Poor Standing balance comment: up to modA with LOB posterior and R.     Special needs/care consideration Special service needs none    Previous Home Environment (from acute therapy documentation) Living Arrangements: Other relatives (brother) Available Help at Discharge: Family Type of Home: House Home Layout: One level Home Access: [place] entrance [place]: Handicapped height Additional Comments: Information taken from 2022; unsure if still accurate. Recent IVC admission and cleared by psych   Discharge Living Setting Plans for Discharge Living Setting: Patient's home Type of Home at Discharge: House Discharge Home Layout: One level Discharge Home Access: Ramped entrance Discharge Bathroom  Shower/Tub: Tub/shower unit Discharge Bathroom Toilet: Handicapped height Discharge Bathroom Accessibility: Yes How Accessible: Accessible via walker, Accessible via wheelchair   [place]  Patient Roles: Other (Comment) Contact Information: [phone] Anticipated Caregiver: Jayion Favinger (brother) can provide 24/7 assist with no limitations Ability/Limitations of Caregiver: None Caregiver Availability: 24/7 Discharge Plan Discussed with Primary Caregiver: Yes Is Caregiver In Agreement with Plan?: Yes Does Caregiver/Family have Issues with Lodging/Transportation while Pt is in Rehab?: No   Goals Patient/Family Goal for Rehab: PT/OT Min A-Supervision, SLP Min A-Mod A Expected length of stay: 12-14 days Pt/Family Agrees to Admission and willing to participate: Yes Program Orientation Provided & Reviewed with Pt/Caregiver Including Roles  & Responsibilities: Yes   Decrease burden of Care through IP rehab admission: none   Possible need for SNF placement upon discharge: not anticipated    Patient Condition: I have reviewed medical records from [facility] , spoken with CM, and patient and family member. I met with patient at the bedside for inpatient rehabilitation assessment.  Patient will benefit from ongoing PT, OT, and SLP, can actively participate in 3 hours of therapy a day 5 days of the week, and can make measurable gains during the admission.  Patient will also benefit from the coordinated team approach during an Inpatient Acute Rehabilitation admission.  The patient will receive intensive therapy as well as Rehabilitation physician, nursing, social worker, and care management interventions.  Due to bladder management, bowel management, safety, skin/wound care, disease management, medication administration, pain management, and patient education the patient requires 24 hour a day rehabilitation nursing.  The patient is currently min to mod assist  with mobility and basic ADLs.  Discharge setting and therapy post discharge at home with home health is anticipated.  Patient has agreed to participate in the Acute Inpatient Rehabilitation Program and will admit today.   Preadmission Screen Completed By:  Retta Diones, 02/24/2022 3:51 PM ______________________________________________________________________   Discussed status with Dr. Dagoberto Ligas on 02/24/22 at 1500 and received approval for admission today.   Admission Coordinator:  Retta Diones, RN, time 1550/Date 02/24/22    Assessment/Plan: Diagnosis: Does the need for close, 24 hr/day Medical supervision in concert with the patient's rehab needs make it unreasonable for this patient to be served in a less intensive setting? Yes Co-Morbidities requiring supervision/potential complications: uncontrolled IDDM, CAD s/p NSTEMI; schizoaffective d/o; polysubstance abuse; expressive aphasia, new L ACA/PCA strokes; PTSD, prior CVA Due to bladder management, bowel management, safety, skin/wound care, disease management, medication administration, and patient education, does the patient require 24 hr/day rehab nursing? Yes Does the patient require coordinated care of a physician, rehab nurse, PT, OT, and SLP to address physical and functional deficits in the context of the above medical diagnosis(es)? Yes Addressing deficits in the following areas: balance, endurance, locomotion, strength, transferring, bowel/bladder control, bathing, dressing, feeding, grooming, toileting, cognition, speech, language, and swallowing Can the patient actively participate in an intensive therapy program of at least 3 hrs of therapy 5 days a week? Yes The potential for patient to make measurable gains while on inpatient rehab is good and fair Anticipated functional outcomes upon discharge from inpatient rehab: supervision and min assist PT, supervision and min assist OT, min assist and mod assist SLP Estimated rehab length of  stay to reach the above functional goals is: 12-16 days Anticipated discharge destination: Home 10. Overall Rehab/Functional Prognosis: good and fair     MD Signature:           Revision History

## 2022-02-24 NOTE — TOC Transition Note (Signed)
Transition of Care [facility]) - CM/SW Discharge Note   Patient Details  Name: FIORE DETJEN MRN: [id number] Date of Birth: 09-16-1955  Transition of [facility] CM/SW Contact:  Pollie Friar, RN Phone Number: 02/24/2022, 4:04 PM   Clinical Narrative:    Pt is discharging to CIR today. CM signing off.   Final next level of care: IP Rehab Facility Barriers to Discharge: No Barriers Identified   Patient Goals and CMS Choice CMS Medicare.gov Compare Post Acute Care list provided to:: Patient Choice offered to / list presented to : Patient  Discharge Placement                         Discharge Plan and Services Additional resources added to the After Visit Summary for                                       Social Determinants of Health (SDOH) Interventions SDOH Screenings   Tobacco Use: High Risk (02/17/2022)     Readmission Risk Interventions    04/19/2020    2:34 PM  Readmission Risk Prevention Plan  Transportation Screening Complete  Medication Review ([place]) Complete  PCP or Specialist appointment within 3-5 days of discharge Complete  HRI or [place] Complete  SW Recovery Care/Counseling Consult Complete  [place] Not Applicable

## 2022-02-24 NOTE — Progress Notes (Signed)
PROGRESS NOTE    CHASIN FINDLING  UUV:[id number] DOB: [birth year] DOA: 02/17/2022 PCP: [facility]   Brief Narrative:  67 y.o. male with medical history significant of  CAD s/p NSTEMI, hyperlipidemia, DMII insulin dependent, cerebral thrombosis with CVA, Essential hypertension, hx of polysubstance abuse , hx of endocarditis, hx of pulmonary cryptococosis, GERD, etoh abuse, Schizoaffectic disorder ,PTSD presented with increasing lethargy, vomiting and decreased oral intake.  He was found to have acute to early subacute ischemic infarct.  Neurology was consulted.  He was started on aspirin and Plavix.  PT/OT recommended CIR placement.  Currently medically stable for discharge to CIR.  Assessment & Plan:   Acute to early subacute ischemic infarct in the medial left frontal lobe correlating with left ACA territory and ACA-PCA watershed infarct/hypertensive emergency/acute encephalopathy -Has had history of prior CVA -Has had expressive intermittent aphasia: Improving slightly -Neurology recommended aspirin and Plavix for 3 weeks then Plavix alone and signed off. -LDL 217 and cholesterol 291: Currently on Lipitor 80 mg daily. -Recent A1c on 01/17/2022 was 10.6.  Bubble study echo showed EF of 55 to 60% with grade 1 diastolic dysfunction with no shunting -PT/OT recommended CIR.  Currently medically stable for discharge to CIR.  Hypertension -He was allowed for permissive hypertension initially. -Toprol-XL has subsequently been resumed.  Blood pressures currently intermittently elevated.  Diabetes mellitus type 2 with hyperglycemia and hypoglycemia -Increase dose of long-acting insulin.  Continue CBGs with SSI  History of CAD/non-STEMI -continue aspirin, statin, beta-blocker.  Currently stable.  Outpatient follow-up with cardiology  Schizoaffective disorder/PTSD -Continue current regimen.  Outpatient follow-up with psychiatry -Patient apparently does not have capacity to make  healthcare decisions for himself  History of polysubstance abuse -cocaine positive in UDS.  Also uses alcohol  Hypokalemia -Resolved  Acute kidney injury -Resolved  GERD -Continue PPI  DVT prophylaxis: Heparin subcutaneous Code Status: Full Family Communication: None at bedside Disposition Plan: Status is: Inpatient Remains inpatient appropriate because: Of severity of illness.  Need for CIR placement  Consultants: Neurology  Procedures: Echo  Antimicrobials: None   Subjective: Patient seen and examined at bedside.  Awake, slow to respond, poor historian.  No agitation, seizures, fever or vomiting reported. Objective: Vitals:   02/23/22 1600 02/23/22 1939 02/23/22 2315 02/24/22 0309  BP: (!) 140/71 (!) 191/90 (!) 199/100 (!) 182/86  Pulse: 68 82 87 81  Resp: 16 17 17 17   Temp: 98.2 F (36.8 C) 99.2 F (37.3 C) 98.5 F (36.9 C) 98.7 F (37.1 C)  TempSrc:  Oral Oral Oral  SpO2: 99% 97% 98% 97%  Weight:      Height:        Intake/Output Summary (Last 24 hours) at 02/24/2022 0756 Last data filed at 02/24/2022 0510 Gross per 24 hour  Intake 1860 ml  Output 4500 ml  Net -2640 ml    Filed Weights   02/18/22 0525  Weight: 45.2 kg    Examination:  General exam: No distress.  On room air currently.  Still slow to respond.  Poor historian. Respiratory system: Decreased breath sounds at bases bilaterally with some crackles  cardiovascular system: Rate mostly controlled; S1 and S2 are heard gastrointestinal system: Abdomen is distended slightly; soft and nontender.  Bowel sounds are heard  extremities: No edema or clubbing  Data Reviewed: I have personally reviewed following labs and imaging studies  CBC: Recent Labs  Lab 02/17/22 1513 02/18/22 0652 02/19/22 0248  WBC 6.5 6.7 6.2  NEUTROABS 5.0  --  3.1  HGB 14.6 15.2 13.6  HCT 47.1 49.1 40.3  MCV 89.4 89.9 86.1  PLT 235 227 854    Basic Metabolic Panel: Recent Labs  Lab 02/17/22 1513  02/18/22 0652 02/19/22 0248 02/21/22 0824 02/21/22 1013  NA 141 144 141 138 136  K 4.1 4.0 3.4* 5.7* 4.1  CL 105 108 110 107 106  CO2 23 19* 23 18* 22  GLUCOSE 343* 208* 53* 214* 173*  BUN 29* 20 19 28* 22  CREATININE 1.07 1.02 0.70 1.42* 0.89  CALCIUM 8.7* 9.1 8.5* 8.3* 8.3*  MG  --   --   --  2.2  --     GFR: Estimated Creatinine Clearance: 50.8 mL/min (by C-G formula based on SCr of 0.89 mg/dL). Liver Function Tests: Recent Labs  Lab 02/17/22 1513 02/18/22 0652  AST 30 27  ALT 23 21  ALKPHOS 58 62  BILITOT 0.8 0.8  PROT 7.4 7.3  ALBUMIN 3.6 3.7    Recent Labs  Lab 02/17/22 2119  LIPASE 31    Recent Labs  Lab 02/17/22 1514  AMMONIA <10    Coagulation Profile: No results for input(s): "INR", "PROTIME" in the last 168 hours. Cardiac Enzymes: No results for input(s): "CKTOTAL", "CKMB", "CKMBINDEX", "TROPONINI" in the last 168 hours. BNP (last 3 results) No results for input(s): "PROBNP" in the last 8760 hours. HbA1C: No results for input(s): "HGBA1C" in the last 72 hours. CBG: Recent Labs  Lab 02/23/22 1119 02/23/22 1615 02/23/22 2112 02/24/22 0311 02/24/22 0605  GLUCAP 119* 297* 302* 181* 280*    Lipid Profile: No results for input(s): "CHOL", "HDL", "LDLCALC", "TRIG", "CHOLHDL", "LDLDIRECT" in the last 72 hours. Thyroid Function Tests: No results for input(s): "TSH", "T4TOTAL", "FREET4", "T3FREE", "THYROIDAB" in the last 72 hours. Anemia Panel: No results for input(s): "VITAMINB12", "FOLATE", "FERRITIN", "TIBC", "IRON", "RETICCTPCT" in the last 72 hours. Sepsis Labs: Recent Labs  Lab 02/17/22 1514 02/17/22 2119 02/20/22 1021  LATICACIDVEN 2.0* 1.3 1.5     Recent Results (from the past 240 hour(s))  Resp panel by RT-PCR (RSV, Flu A&B, Covid) Anterior Nasal Swab     Status: None   Collection Time: 02/17/22  3:48 PM   Specimen: Anterior Nasal Swab  Result Value Ref Range Status   SARS Coronavirus 2 by RT PCR NEGATIVE NEGATIVE Final     Comment: (NOTE) SARS-CoV-2 target nucleic acids are NOT DETECTED.  The SARS-CoV-2 RNA is generally detectable in upper respiratory specimens during the acute phase of infection. The lowest concentration of SARS-CoV-2 viral copies this assay can detect is 138 copies/mL. A negative result does not preclude SARS-Cov-2 infection and should not be used as the sole basis for treatment or other patient management decisions. A negative result may occur with  improper specimen collection/handling, submission of specimen other than nasopharyngeal swab, presence of viral mutation(s) within the areas targeted by this assay, and inadequate number of viral copies(<138 copies/mL). A negative result must be combined with clinical observations, patient history, and epidemiological information. The expected result is Negative.  Fact Sheet for Patients:  EntrepreneurPulse.com.au  Fact Sheet for Healthcare Providers:  IncredibleEmployment.be  This test is no t yet approved or cleared by the [place] FDA and  has been authorized for detection and/or diagnosis of SARS-CoV-2 by FDA under an Emergency Use Authorization (EUA). This EUA will remain  in effect (meaning this test can be used) for the duration of the COVID-19 declaration under Section 564(b)(1) of the Act, 21 U.S.C.section 360bbb-3(b)(1), unless  the authorization is terminated  or revoked sooner.       Influenza A by PCR NEGATIVE NEGATIVE Final   Influenza B by PCR NEGATIVE NEGATIVE Final    Comment: (NOTE) The Xpert Xpress SARS-CoV-2/FLU/RSV plus assay is intended as an aid in the diagnosis of influenza from Nasopharyngeal swab specimens and should not be used as a sole basis for treatment. Nasal washings and aspirates are unacceptable for Xpert Xpress SARS-CoV-2/FLU/RSV testing.  Fact Sheet for Patients: EntrepreneurPulse.com.au  Fact Sheet for Healthcare  Providers: IncredibleEmployment.be  This test is not yet approved or cleared by the [place] FDA and has been authorized for detection and/or diagnosis of SARS-CoV-2 by FDA under an Emergency Use Authorization (EUA). This EUA will remain in effect (meaning this test can be used) for the duration of the COVID-19 declaration under Section 564(b)(1) of the Act, 21 U.S.C. section 360bbb-3(b)(1), unless the authorization is terminated or revoked.     Resp Syncytial Virus by PCR NEGATIVE NEGATIVE Final    Comment: (NOTE) Fact Sheet for Patients: EntrepreneurPulse.com.au  Fact Sheet for Healthcare Providers: IncredibleEmployment.be  This test is not yet approved or cleared by the [place] FDA and has been authorized for detection and/or diagnosis of SARS-CoV-2 by FDA under an Emergency Use Authorization (EUA). This EUA will remain in effect (meaning this test can be used) for the duration of the COVID-19 declaration under Section 564(b)(1) of the Act, 21 U.S.C. section 360bbb-3(b)(1), unless the authorization is terminated or revoked.  Performed at [facility], [place] [address], [place], [place] [address]          Radiology Studies: No results found.      Scheduled Meds:  aspirin EC  81 mg Oral Daily   atorvastatin  80 mg Oral QHS   clopidogrel  75 mg Oral Daily   dapagliflozin propanediol  10 mg Oral Daily   DULoxetine  60 mg Oral Daily   famotidine  20 mg Oral QAC breakfast   feeding supplement (NEPRO CARB STEADY)  237 mL Oral TID WC   folic acid  1 mg Oral Daily   gabapentin  300 mg Oral BID   heparin  5,000 Units Subcutaneous Q8H   insulin aspart  0-9 Units Subcutaneous TID WC   insulin glargine-yfgn  10 Units Subcutaneous Daily   metoprolol succinate  25 mg Oral Daily   multivitamin with minerals  1 tablet Oral Daily   OLANZapine  5 mg Oral QHS   pantoprazole  40 mg Oral Daily    traZODone  100 mg Oral QHS   Continuous Infusions:        Aline August, MD [facility] 02/24/2022, 7:56 AM

## 2022-02-24 NOTE — Discharge Summary (Signed)
Physician Discharge Summary  William Acevedo [id number] DOB: 05-13-1955 DOA: 02/17/2022  PCP: [facility]  Admit date: 02/17/2022 Discharge date: 02/24/2022  Admitted From: Home Disposition: CIR  Recommendations for Outpatient Follow-up:  Follow up with CIR provider at earliest convenience Outpatient follow-up with neurology  follow up in ED if symptoms worsen or new appear   Home Health: No Equipment/Devices: None  Discharge Condition: Stable CODE STATUS: Full Diet recommendation: Heart healthy/carb modified/diet as per SLP recommendations  Brief/Interim Summary: 67 y.o. male with medical history significant of  CAD s/p NSTEMI, hyperlipidemia, DMII insulin dependent, cerebral thrombosis with CVA, Essential hypertension, hx of polysubstance abuse , hx of endocarditis, hx of pulmonary cryptococosis, GERD, etoh abuse, Schizoaffectic disorder ,PTSD presented with increasing lethargy, vomiting and decreased oral intake.  He was found to have acute to early subacute ischemic infarct.  Neurology was consulted.  He was started on aspirin and Plavix.  PT/OT recommended CIR placement.  Currently medically stable for discharge to CIR. he will be discharged CIR once bed is available.  Discharge Diagnoses:   Acute to early subacute ischemic infarct in the medial left frontal lobe correlating with left ACA territory and ACA-PCA watershed infarct/hypertensive emergency/acute encephalopathy -Has had history of prior CVA -Has had expressive intermittent aphasia: Improving slightly -Neurology recommended aspirin and Plavix for 3 weeks then Plavix alone and signed off.  Outpatient follow-up with neurology. -LDL 217 and cholesterol 291: Currently on Lipitor 80 mg daily. -Recent A1c on 01/17/2022 was 10.6.  Bubble study echo showed EF of 55 to 60% with grade 1 diastolic dysfunction with no shunting -PT/OT recommended CIR.  Currently medically stable for discharge to CIR. -Discharge to CIR  once bed is available   Hypertension -He was allowed for permissive hypertension initially. -Toprol-XL has subsequently been resumed.  Blood pressures currently intermittently elevated.  Amlodipine and hydralazine on hold.   Diabetes mellitus type 2 with hyperglycemia and hypoglycemia -Continue long-acting insulin along with with NovoLog with meals.  Carb modified diet.  Outpatient follow-up with   history of CAD/non-STEMI -continue aspirin, statin, beta-blocker.  Currently stable.  Outpatient follow-up with cardiology   Schizoaffective disorder/PTSD -Continue current regimen.  Outpatient follow-up with psychiatry -Patient apparently does not have capacity to make healthcare decisions for himself   History of polysubstance abuse -cocaine positive in UDS.  Also uses alcohol   Hypokalemia -Resolved   Acute kidney injury -Resolved   GERD -Continue PPI  Discharge Instructions  Discharge Instructions     Ambulatory referral to Neurology   Complete by: As directed    An appointment is requested in approximately: 2 weeks   Diet - low sodium heart healthy   Complete by: As directed    Diet Carb Modified   Complete by: As directed    Increase activity slowly   Complete by: As directed       Allergies as of 02/24/2022       Reactions   Lisinopril Swelling   Angioedema Pt reports not allergic 06/07/20   Omeprazole    Other reaction(s): ANGIOEDEMA OF LIPS, Angioedema of tongue. Pt reports not allergic 06/07/20        Medication List     STOP taking these medications    amLODipine 10 MG tablet Commonly known as: NORVASC   Belsomra 10 MG Tabs Generic drug: Suvorexant   fluconazole 200 MG tablet Commonly known as: DIFLUCAN   glipiZIDE 10 MG tablet Commonly known as: GLUCOTROL   hydrALAZINE 10 MG tablet Commonly known  as: APRESOLINE   hydrOXYzine 25 MG tablet Commonly known as: ATARAX   insulin lispro 100 UNIT/ML KwikPen Commonly known as: HUMALOG    Insulin Pen Needle 29G X Misc   Jardiance 25 MG Tabs tablet Generic drug: empagliflozin   nicotine polacrilex 2 MG lozenge Commonly known as: COMMIT   polyethylene glycol powder 17 GM/SCOOP powder Commonly known as: GLYCOLAX/MIRALAX   rivastigmine 1.5 MG capsule Commonly known as: EXELON   rosuvastatin 40 MG tablet Commonly known as: CRESTOR   traMADol 50 MG tablet Commonly known as: ULTRAM       TAKE these medications    Accu-Chek Guide test strip Generic drug: glucose blood   albuterol (2.5 MG/3ML) 0.083% nebulizer solution Commonly known as: PROVENTIL Take 3 mLs by nebulization every 6 (six) hours as needed.   aspirin EC 81 MG tablet Take 1 tablet (81 mg total) by mouth daily. Swallow whole.   atorvastatin 80 MG tablet Commonly known as: LIPITOR Take 1 tablet (80 mg total) by mouth at bedtime. What changed:  medication strength how much to take   blood glucose meter kit and supplies Kit Dispense based on patient and insurance preference. Use up to four times daily as directed. (FOR ICD-9 250.00, 250.01).   clopidogrel 75 MG tablet Commonly known as: PLAVIX Take 1 tablet (75 mg total) by mouth daily.   DULoxetine 60 MG capsule Commonly known as: CYMBALTA Take 60 mg by mouth daily. For chronic pain and mood   famotidine 20 MG tablet Commonly known as: PEPCID Take 20 mg by mouth daily before breakfast.   Farxiga 10 MG Tabs tablet Generic drug: dapagliflozin propanediol Take 10 mg by mouth daily.   folic acid 1 MG tablet Commonly known as: FOLVITE Take 1 mg by mouth daily.   gabapentin 300 MG capsule Commonly known as: NEURONTIN Take 300 mg by mouth 2 (two) times daily. What changed: Another medication with the same name was removed. Continue taking this medication, and follow the directions you see here.   insulin aspart 100 UNIT/ML injection Commonly known as: NovoLOG Inject 5 Units into the skin 3 (three) times daily with meals. INJECT  6 UNITS UNDER THE SKIN THREE TIMES A DAY WITH MEALS FOR DIABETES  "DISCARD AND OPEN A NEW PEN EVERY 28 DAYS" What changed:  how much to take when to take this   insulin glargine-yfgn 100 UNIT/ML Pen Commonly known as: SEMGLEE Inject 15 Units into the skin 2 (two) times daily.   melatonin 3 MG Tabs tablet Take 6 mg by mouth at bedtime as needed (for sleep).   metoprolol succinate 50 MG 24 hr tablet Commonly known as: TOPROL-XL Take 25 mg by mouth daily. Take with or immediately following a meal.   OLANZapine 5 MG tablet Commonly known as: ZYPREXA Take 5 mg by mouth at bedtime.   ondansetron 4 MG tablet Commonly known as: ZOFRAN Take 4 mg by mouth every 8 (eight) hours as needed for nausea or vomiting.   pantoprazole 40 MG tablet Commonly known as: PROTONIX Take 40 mg by mouth daily.   traZODone 100 MG tablet Commonly known as: DESYREL Take 100 mg by mouth at bedtime.        Allergies  Allergen Reactions   Lisinopril Swelling    Angioedema Pt reports not allergic 06/07/20   Omeprazole     Other reaction(s): ANGIOEDEMA OF LIPS, Angioedema of tongue. Pt reports not allergic 06/07/20    Consultations: Neurology   Procedures/Studies: ECHOCARDIOGRAM LIMITED BUBBLE  STUDY  Result Date: 02/18/2022    ECHOCARDIOGRAM LIMITED REPORT   Patient Name:   GREGREY BOUMAN Date of Exam: 02/18/2022 Medical Rec #:  [id number]           Height:       66.0 in Accession #:    [id number]          Weight:       99.6 lb Date of Birth:  12-Oct-1955           BSA:          1.487 m Patient Age:    67 years            BP:           175/91 mmHg Patient Gender: M                   HR:           80 bpm. Exam Location:  Inpatient Procedure: Limited Echo, Color Doppler, Cardiac Doppler and Saline Contrast            Bubble Study Indications:    stroke  History:        Patient has prior history of Echocardiogram examinations, most                 recent 07/10/2020. CAD and Previous Myocardial Infarction,                  Stroke; Risk Factors:Diabetes.  Sonographer:    Harvie Junior Referring Phys: [id number] SARA-MAIZ A THOMAS  Sonographer Comments: Technically difficult study due to poor echo windows. IMPRESSIONS  1. Left ventricular ejection fraction, by estimation, is 55 to 60%. The left ventricle has normal function. The left ventricle has no regional wall motion abnormalities. There is mild asymmetric left ventricular hypertrophy of the basal and septal segments. Left ventricular diastolic parameters are consistent with Grade I diastolic dysfunction (impaired relaxation). Elevated left ventricular end-diastolic pressure.  2. Right ventricular systolic function is normal. The right ventricular size is normal.  3. The mitral valve is abnormal. Trivial mitral valve regurgitation. No evidence of mitral stenosis. Severe mitral annular calcification.  4. The aortic valve is tricuspid. There is moderate calcification of the aortic valve. There is moderate thickening of the aortic valve. Aortic valve regurgitation is not visualized. Aortic valve sclerosis/calcification is present, without any evidence of aortic stenosis.  5. The inferior vena cava is normal in size with greater than 50% respiratory variability, suggesting right atrial pressure of 3 mmHg. FINDINGS  Left Ventricle: Left ventricular ejection fraction, by estimation, is 55 to 60%. The left ventricle has normal function. The left ventricle has no regional wall motion abnormalities. The left ventricular internal cavity size was normal in size. There is  mild asymmetric left ventricular hypertrophy of the basal and septal segments. Left ventricular diastolic parameters are consistent with Grade I diastolic dysfunction (impaired relaxation). Elevated left ventricular end-diastolic pressure. Right Ventricle: The right ventricular size is normal. No increase in right ventricular wall thickness. Right ventricular systolic function is normal. Left Atrium: Left atrial  size was normal in size. Right Atrium: Right atrial size was normal in size. Pericardium: There is no evidence of pericardial effusion. Mitral Valve: The mitral valve is abnormal. There is moderate thickening of the mitral valve leaflet(s). There is moderate calcification of the mitral valve leaflet(s). Severe mitral annular calcification. Trivial mitral valve regurgitation. No evidence of mitral valve stenosis. Tricuspid Valve: The tricuspid valve is  normal in structure. Tricuspid valve regurgitation is not demonstrated. No evidence of tricuspid stenosis. Aortic Valve: The aortic valve is tricuspid. There is moderate calcification of the aortic valve. There is moderate thickening of the aortic valve. Aortic valve regurgitation is not visualized. Aortic valve sclerosis/calcification is present, without any  evidence of aortic stenosis. Aortic valve mean gradient measures 1.0 mmHg. Aortic valve peak gradient measures 2.6 mmHg. Aortic valve area, by VTI measures 3.01 cm. Pulmonic Valve: The pulmonic valve was normal in structure. Pulmonic valve regurgitation is not visualized. No evidence of pulmonic stenosis. Aorta: The aortic root is normal in size and structure. Venous: The inferior vena cava is normal in size with greater than 50% respiratory variability, suggesting right atrial pressure of 3 mmHg. IAS/Shunts: The interatrial septum appears to be lipomatous. No atrial level shunt detected by color flow Doppler. Agitated saline contrast was given intravenously to evaluate for intracardiac shunting. LEFT VENTRICLE PLAX 2D LVIDd:         3.30 cm     Diastology LVIDs:         2.40 cm     LV e' medial:    5.66 cm/s LV PW:         1.20 cm     LV E/e' medial:  19.6 LV IVS:        1.20 cm     LV e' lateral:   6.20 cm/s LVOT diam:     1.80 cm     LV E/e' lateral: 17.9 LV SV:         54 LV SV Index:   36 LVOT Area:     2.54 cm  LV Volumes (MOD) LV vol d, MOD A2C: 64.6 ml LV vol d, MOD A4C: 64.3 ml LV vol s, MOD A2C: 26.2  ml LV vol s, MOD A4C: 25.7 ml LV SV MOD A2C:     38.4 ml LV SV MOD A4C:     64.3 ml LV SV MOD BP:      38.3 ml LEFT ATRIUM         Index LA diam:    2.70 cm 1.82 cm/m  AORTIC VALVE                    PULMONIC VALVE AV Area (Vmax):    2.84 cm     PV Vmax:       1.00 m/s AV Area (Vmean):   3.17 cm     PV Peak grad:  4.0 mmHg AV Area (VTI):     3.01 cm AV Vmax:           81.30 cm/s AV Vmean:          54.200 cm/s AV VTI:            0.180 m AV Peak Grad:      2.6 mmHg AV Mean Grad:      1.0 mmHg LVOT Vmax:         90.70 cm/s LVOT Vmean:        67.500 cm/s LVOT VTI:          0.213 m LVOT/AV VTI ratio: 1.18  AORTA Ao Root diam: 3.00 cm MITRAL VALVE MV Area (PHT): 3.97 cm     SHUNTS MV Decel Time: 191 msec     Systemic VTI:  0.21 m MV E velocity: 111.00 cm/s  Systemic Diam: 1.80 cm MV A velocity: 157.00 cm/s MV E/A ratio:  0.71 Jenkins Rouge MD Electronically signed  by Jenkins Rouge MD Signature Date/Time: 02/18/2022/3:25:53 PM    Final    CT Angio Head W or Wo Contrast  Result Date: 02/17/2022 CLINICAL DATA:  Stroke follow-up EXAM: CT ANGIOGRAPHY HEAD TECHNIQUE: Multidetector CT imaging of the head was performed using the standard protocol during bolus administration of intravenous contrast. Multiplanar CT image reconstructions and MIPs were obtained to evaluate the vascular anatomy. RADIATION DOSE REDUCTION: This exam was performed according to the departmental dose-optimization program which includes automated exposure control, adjustment of the mA and/or kV according to patient size and/or use of iterative reconstruction technique. CONTRAST:  19mL OMNIPAQUE IOHEXOL 350 MG/ML SOLN COMPARISON:  Brain MRI 02/17/2022 FINDINGS: POSTERIOR CIRCULATION: --Vertebral arteries: Normal --Inferior cerebellar arteries: Normal. --Basilar artery: Normal. --Superior cerebellar arteries: Normal. --Posterior cerebral arteries: Normal. ANTERIOR CIRCULATION: --Intracranial internal carotid arteries: Severe stenosis of the proximal  cavernous segment of the left ICA. Bilateral atherosclerotic calcification. --Anterior cerebral arteries (ACA): Left A2 segment occlusion, corresponding to the infarct seen on concomitant brain MRI. --Middle cerebral arteries (MCA): Normal. Venous sinuses: As permitted by contrast timing, patent. Anatomic variants: None Review of the MIP images confirms the above findings. IMPRESSION: 1. Left A2 segment occlusion, corresponding to the infarct seen on concomitant brain MRI. 2. Severe stenosis of the proximal cavernous segment of the left ICA. Electronically Signed   By: Ulyses Jarred M.D.   On: 02/17/2022 20:46   MR BRAIN WO CONTRAST  Result Date: 02/17/2022 CLINICAL DATA:  Stroke suspected EXAM: MRI HEAD WITHOUT CONTRAST TECHNIQUE: Multiplanar, multiecho pulse sequences of the brain and surrounding structures were obtained without intravenous contrast. COMPARISON:  07/10/2020 MRI head, correlation is made with CT head 02/17/2022 FINDINGS: Evaluation is somewhat limited by motion artifact. Brain: Restricted diffusion with ADC correlate in the medial left frontal lobe, primarily cortical and subcortical, extending from the anterior inferior left frontal lobe to the more superior and posterior left frontal lobe (series 5, images 15-27, 29, and 31), correlating with the left ACA territory and ACA-PCA watershed. These areas are associated with T2 hyperintense signal, likely cytotoxic edema. No acute hemorrhage, mass, mass effect, or midline shift. No hydrocephalus or extra-axial collection. Remote infarcts involving the bilateral occipital cortex, right parietal cortex, and posterior left frontal lobe white matter. Confluent T2 hyperintense signal in the periventricular white matter, likely the sequela of moderate to severe chronic small vessel ischemic disease. Vascular: Normal proximal arterial flow voids. Skull and upper cervical spine: Normal marrow signal. Sinuses/Orbits: Mucosal thickening in the posterior left  ethmoid air cells. No acute finding in the orbits. Other: The mastoids are well aerated. IMPRESSION: 1. Acute to early subacute infarct in the medial left frontal lobe, correlating with the left ACA territory and ACA-PCA watershed. No evidence of hemorrhagic transformation or significant mass effect. 2. Remote infarcts involving the bilateral occipital cortex, right parietal cortex, and posterior left frontal lobe white matter. These results were called by telephone at the time of interpretation on 02/17/2022 at 7:02 pm to provider ZAMMIT , who verbally acknowledged these results. Electronically Signed   By: Merilyn Baba M.D.   On: 02/17/2022 19:03   CT Head Wo Contrast  Result Date: 02/17/2022 CLINICAL DATA:  Altered mental status, lethargy EXAM: CT HEAD WITHOUT CONTRAST TECHNIQUE: Contiguous axial images were obtained from the base of the skull through the vertex without intravenous contrast. RADIATION DOSE REDUCTION: This exam was performed according to the departmental dose-optimization program which includes automated exposure control, adjustment of the mA and/or kV according to patient size  and/or use of iterative reconstruction technique. COMPARISON:  CT brain, 07/09/2020 FINDINGS: Brain: Extensive, multifocal bilateral encephalomalacia, some of which appears to be new or at least increased compared to prior examination, for example in the medial right frontal lobe (series 2, image 18) and in the right parietal vertex (series 2, image 18). No evidence of hemorrhage, hydrocephalus, extra-axial collection or mass lesion/mass effect. Vascular: No hyperdense vessel or unexpected calcification. Skull: Normal. Negative for fracture or focal lesion. Sinuses/Orbits: No acute finding. Other: None. IMPRESSION: Extensive, multifocal bilateral encephalomalacia, some of which appears to be new or at least increased compared to prior examination dated 07/09/2020 although is generally age indeterminate. Recommend MR to  further evaluate if there is clinical suspicion for acute diffusion restricting infarction. Electronically Signed   By: Delanna Ahmadi M.D.   On: 02/17/2022 16:21   CT ABDOMEN PELVIS W CONTRAST  Result Date: 02/17/2022 CLINICAL DATA:  Abdominal pain.  Acute, nonlocalized. EXAM: CT ABDOMEN AND PELVIS WITH CONTRAST TECHNIQUE: Multidetector CT imaging of the abdomen and pelvis was performed using the standard protocol following bolus administration of intravenous contrast. RADIATION DOSE REDUCTION: This exam was performed according to the departmental dose-optimization program which includes automated exposure control, adjustment of the mA and/or kV according to patient size and/or use of iterative reconstruction technique. CONTRAST:  133mL OMNIPAQUE IOHEXOL 300 MG/ML  SOLN COMPARISON:  None Available. FINDINGS: Lower chest: Mild atelectasis in the RIGHT middle lobe. Otherwise lung bases clear. Hepatobiliary: No focal hepatic lesion. Normal gallbladder. No biliary duct dilatation. Common bile duct is normal. Pancreas: Pancreas normal Spleen: Normal spleen Adrenals/urinary tract: Adrenal glands normal. Bilateral nonobstructing renal calculi. No enhancing renal cortical lesion. Cortical scarring in the RIGHT kidney. Bladder unremarkable Stomach/Bowel: Stomach, small-bowel and cecum are normal. The appendix is not identified but there is no pericecal inflammation to suggest appendicitis. The colon and rectosigmoid colon are normal. Vascular/Lymphatic: Abdominal aorta is normal caliber with atherosclerotic calcification. There is no retroperitoneal or periportal lymphadenopathy. No pelvic lymphadenopathy. Reproductive: Prostate unremarkable Other: Very little intra-abdominal fat.  No free fluid or free air. Musculoskeletal: No aggressive osseous lesion. IMPRESSION: 1. No acute findings in the abdomen pelvis. 2. Bilateral nonobstructing renal calculi. 3. Very little intra-abdominal fat. 4.  Aortic Atherosclerosis  (ICD10-I70.0). Electronically Signed   By: Suzy Bouchard M.D.   On: 02/17/2022 16:18   DG Chest Portable 1 View  Result Date: 02/17/2022 CLINICAL DATA:  Altered mental status EXAM: PORTABLE CHEST 1 VIEW COMPARISON:  CXR 01/23/22 FINDINGS: No pleural effusion. No pneumothorax. No focal airspace opacity. Normal cardiac and mediastinal contours. No radiographically apparent displaced rib fractures. Visualized upper abdomen is unremarkable. IMPRESSION: No active disease. Electronically Signed   By: Marin Roberts M.D.   On: 02/17/2022 15:17      Subjective: Patient seen and examined at bedside.  Awake, slow to respond, poor historian.  No agitation, seizures, fever or vomiting reported.   Discharge Exam: Vitals:   02/24/22 0822 02/24/22 1119  BP: (!) 94/50 (!) 95/52  Pulse: 78 75  Resp: 18 18  Temp: 98.5 F (36.9 C) 98.4 F (36.9 C)  SpO2: 97% 100%    General exam: No distress.  On room air currently.  Still slow to respond.  Poor historian. Respiratory system: Decreased breath sounds at bases bilaterally with some crackles  cardiovascular system: Rate mostly controlled; S1 and S2 are heard gastrointestinal system: Abdomen is distended slightly; soft and nontender.  Bowel sounds are heard  extremities: No edema or clubbing  The results of significant diagnostics from this hospitalization (including imaging, microbiology, ancillary and laboratory) are listed below for reference.     Microbiology: Recent Results (from the past 240 hour(s))  Resp panel by RT-PCR (RSV, Flu A&B, Covid) Anterior Nasal Swab     Status: None   Collection Time: 02/17/22  3:48 PM   Specimen: Anterior Nasal Swab  Result Value Ref Range Status   SARS Coronavirus 2 by RT PCR NEGATIVE NEGATIVE Final    Comment: (NOTE) SARS-CoV-2 target nucleic acids are NOT DETECTED.  The SARS-CoV-2 RNA is generally detectable in upper respiratory specimens during the acute phase of infection. The lowest concentration  of SARS-CoV-2 viral copies this assay can detect is 138 copies/mL. A negative result does not preclude SARS-Cov-2 infection and should not be used as the sole basis for treatment or other patient management decisions. A negative result may occur with  improper specimen collection/handling, submission of specimen other than nasopharyngeal swab, presence of viral mutation(s) within the areas targeted by this assay, and inadequate number of viral copies(<138 copies/mL). A negative result must be combined with clinical observations, patient history, and epidemiological information. The expected result is Negative.  Fact Sheet for Patients:  EntrepreneurPulse.com.au  Fact Sheet for Healthcare Providers:  IncredibleEmployment.be  This test is no t yet approved or cleared by the [place] FDA and  has been authorized for detection and/or diagnosis of SARS-CoV-2 by FDA under an Emergency Use Authorization (EUA). This EUA will remain  in effect (meaning this test can be used) for the duration of the COVID-19 declaration under Section 564(b)(1) of the Act, 21 U.S.C.section 360bbb-3(b)(1), unless the authorization is terminated  or revoked sooner.       Influenza A by PCR NEGATIVE NEGATIVE Final   Influenza B by PCR NEGATIVE NEGATIVE Final    Comment: (NOTE) The Xpert Xpress SARS-CoV-2/FLU/RSV plus assay is intended as an aid in the diagnosis of influenza from Nasopharyngeal swab specimens and should not be used as a sole basis for treatment. Nasal washings and aspirates are unacceptable for Xpert Xpress SARS-CoV-2/FLU/RSV testing.  Fact Sheet for Patients: EntrepreneurPulse.com.au  Fact Sheet for Healthcare Providers: IncredibleEmployment.be  This test is not yet approved or cleared by the [place] FDA and has been authorized for detection and/or diagnosis of SARS-CoV-2 by FDA under an Emergency Use  Authorization (EUA). This EUA will remain in effect (meaning this test can be used) for the duration of the COVID-19 declaration under Section 564(b)(1) of the Act, 21 U.S.C. section 360bbb-3(b)(1), unless the authorization is terminated or revoked.     Resp Syncytial Virus by PCR NEGATIVE NEGATIVE Final    Comment: (NOTE) Fact Sheet for Patients: EntrepreneurPulse.com.au  Fact Sheet for Healthcare Providers: IncredibleEmployment.be  This test is not yet approved or cleared by the [place] FDA and has been authorized for detection and/or diagnosis of SARS-CoV-2 by FDA under an Emergency Use Authorization (EUA). This EUA will remain in effect (meaning this test can be used) for the duration of the COVID-19 declaration under Section 564(b)(1) of the Act, 21 U.S.C. section 360bbb-3(b)(1), unless the authorization is terminated or revoked.  Performed at [facility], [place] [address] [place], [place] [address]      Labs: BNP (last 3 results) No results for input(s): "BNP" in the last 8760 hours. Basic Metabolic Panel: Recent Labs  Lab 02/17/22 1513 02/18/22 0652 02/19/22 0248 02/21/22 0824 02/21/22 1013  NA 141 144 141 138 136  K 4.1 4.0 3.4*  5.7* 4.1  CL 105 108 110 107 106  CO2 23 19* 23 18* 22  GLUCOSE 343* 208* 53* 214* 173*  BUN 29* 20 19 28* 22  CREATININE 1.07 1.02 0.70 1.42* 0.89  CALCIUM 8.7* 9.1 8.5* 8.3* 8.3*  MG  --   --   --  2.2  --    Liver Function Tests: Recent Labs  Lab 02/17/22 1513 02/18/22 0652  AST 30 27  ALT 23 21  ALKPHOS 58 62  BILITOT 0.8 0.8  PROT 7.4 7.3  ALBUMIN 3.6 3.7   Recent Labs  Lab 02/17/22 2119  LIPASE 31   Recent Labs  Lab 02/17/22 1514  AMMONIA <10   CBC: Recent Labs  Lab 02/17/22 1513 02/18/22 0652 02/19/22 0248  WBC 6.5 6.7 6.2  NEUTROABS 5.0  --  3.1  HGB 14.6 15.2 13.6  HCT 47.1 49.1 40.3  MCV 89.4 89.9 86.1  PLT 235 227 211   Cardiac  Enzymes: No results for input(s): "CKTOTAL", "CKMB", "CKMBINDEX", "TROPONINI" in the last 168 hours. BNP: Invalid input(s): "POCBNP" CBG: Recent Labs  Lab 02/23/22 1615 02/23/22 2112 02/24/22 0311 02/24/22 0605 02/24/22 1119  GLUCAP 297* 302* 181* 280* 341*   D-Dimer No results for input(s): "DDIMER" in the last 72 hours. Hgb A1c No results for input(s): "HGBA1C" in the last 72 hours. Lipid Profile No results for input(s): "CHOL", "HDL", "LDLCALC", "TRIG", "CHOLHDL", "LDLDIRECT" in the last 72 hours. Thyroid function studies No results for input(s): "TSH", "T4TOTAL", "T3FREE", "THYROIDAB" in the last 72 hours.  Invalid input(s): "FREET3" Anemia work up No results for input(s): "VITAMINB12", "FOLATE", "FERRITIN", "TIBC", "IRON", "RETICCTPCT" in the last 72 hours. Urinalysis    Component Value Date/Time   COLORURINE STRAW (A) 02/17/2022 1443   APPEARANCEUR CLEAR 02/17/2022 1443   LABSPEC 1.028 02/17/2022 1443   PHURINE 5.0 02/17/2022 1443   GLUCOSEU >=500 (A) 02/17/2022 1443   HGBUR NEGATIVE 02/17/2022 1443   BILIRUBINUR NEGATIVE 02/17/2022 1443   KETONESUR 20 (A) 02/17/2022 1443   PROTEINUR 30 (A) 02/17/2022 1443   NITRITE NEGATIVE 02/17/2022 1443   LEUKOCYTESUR NEGATIVE 02/17/2022 1443   Sepsis Labs Recent Labs  Lab 02/17/22 1513 02/18/22 0652 02/19/22 0248  WBC 6.5 6.7 6.2   Microbiology Recent Results (from the past 240 hour(s))  Resp panel by RT-PCR (RSV, Flu A&B, Covid) Anterior Nasal Swab     Status: None   Collection Time: 02/17/22  3:48 PM   Specimen: Anterior Nasal Swab  Result Value Ref Range Status   SARS Coronavirus 2 by RT PCR NEGATIVE NEGATIVE Final    Comment: (NOTE) SARS-CoV-2 target nucleic acids are NOT DETECTED.  The SARS-CoV-2 RNA is generally detectable in upper respiratory specimens during the acute phase of infection. The lowest concentration of SARS-CoV-2 viral copies this assay can detect is 138 copies/mL. A negative result does  not preclude SARS-Cov-2 infection and should not be used as the sole basis for treatment or other patient management decisions. A negative result may occur with  improper specimen collection/handling, submission of specimen other than nasopharyngeal swab, presence of viral mutation(s) within the areas targeted by this assay, and inadequate number of viral copies(<138 copies/mL). A negative result must be combined with clinical observations, patient history, and epidemiological information. The expected result is Negative.  Fact Sheet for Patients:  EntrepreneurPulse.com.au  Fact Sheet for Healthcare Providers:  IncredibleEmployment.be  This test is no t yet approved or cleared by the [place] and  has been authorized for  detection and/or diagnosis of SARS-CoV-2 by FDA under an Emergency Use Authorization (EUA). This EUA will remain  in effect (meaning this test can be used) for the duration of the COVID-19 declaration under Section 564(b)(1) of the Act, 21 U.S.C.section 360bbb-3(b)(1), unless the authorization is terminated  or revoked sooner.       Influenza A by PCR NEGATIVE NEGATIVE Final   Influenza B by PCR NEGATIVE NEGATIVE Final    Comment: (NOTE) The Xpert Xpress SARS-CoV-2/FLU/RSV plus assay is intended as an aid in the diagnosis of influenza from Nasopharyngeal swab specimens and should not be used as a sole basis for treatment. Nasal washings and aspirates are unacceptable for Xpert Xpress SARS-CoV-2/FLU/RSV testing.  Fact Sheet for Patients: EntrepreneurPulse.com.au  Fact Sheet for Healthcare Providers: IncredibleEmployment.be  This test is not yet approved or cleared by the [place] FDA and has been authorized for detection and/or diagnosis of SARS-CoV-2 by FDA under an Emergency Use Authorization (EUA). This EUA will remain in effect (meaning this test can be used) for the  duration of the COVID-19 declaration under Section 564(b)(1) of the Act, 21 U.S.C. section 360bbb-3(b)(1), unless the authorization is terminated or revoked.     Resp Syncytial Virus by PCR NEGATIVE NEGATIVE Final    Comment: (NOTE) Fact Sheet for Patients: EntrepreneurPulse.com.au  Fact Sheet for Healthcare Providers: IncredibleEmployment.be  This test is not yet approved or cleared by the [place] FDA and has been authorized for detection and/or diagnosis of SARS-CoV-2 by FDA under an Emergency Use Authorization (EUA). This EUA will remain in effect (meaning this test can be used) for the duration of the COVID-19 declaration under Section 564(b)(1) of the Act, 21 U.S.C. section 360bbb-3(b)(1), unless the authorization is terminated or revoked.  Performed at [facility], [place] [address], [place], [place] [address]      Time coordinating discharge: 35 minutes  SIGNED:   Aline August, MD  [facility] 02/24/2022, 3:08 PM

## 2022-02-24 NOTE — Inpatient Diabetes Management (Signed)
Inpatient Diabetes Program Recommendations  AACE/ADA: New Consensus Statement on Inpatient Glycemic Control (2015)  Target Ranges:  Prepandial:   less than 140 mg/dL      Peak postprandial:   less than 180 mg/dL (1-2 hours)      Critically ill patients:  140 - 180 mg/dL   Lab Results  Component Value Date   GLUCAP 341 (H) 02/24/2022   HGBA1C 10.6 (H) 01/23/2022    Review of Glycemic Control  Latest Reference Range & Units 02/23/22 06:14 02/23/22 11:19 02/23/22 16:15 02/23/22 21:12 02/24/22 03:11 02/24/22 06:05 02/24/22 11:19  Glucose-Capillary 70 - 99 mg/dL 124 (H) 119 (H) 297 (H) 302 (H) 181 (H) 280 (H) 341 (H)   Diabetes history: DM 2 Outpatient Diabetes medications: Farxiga 10 mg Daily, Lantus 15 units bid, Humalog 0-30 units tid Current orders for Inpatient glycemic control:  Semglee 15 units bid Novolog 0-9 units tid  Inpatient Diabetes Program Recommendations:    -  Consider adding Novolog 5 units tid meal coverage if eating >50% of meals.   Patient is familiar to our team and I have personally counseled pt in the past regarding glucose control. Pt at the time used cocaine medicinally for pain and I spoke to him about the difficulty controlling glucose trends when he does this. I have Spoke with pt at bedside today regarding A1c and glucose control at home. Pt reports glucose trends being up and down at home as well. Pt expresses frustration with glucose control but has also partaken in polysubstance use. Pt reports needing to control his glucose levels more at home. Discussed with pt titrating his insulin while he is inpatient to determine insulin needs at discharge.   Thanks,  Tama Headings RN, MSN, BC-ADM Inpatient Diabetes Coordinator Team Pager [phone] (8a-5p)

## 2022-02-24 NOTE — Progress Notes (Signed)
IP rehab admissions - A bed has become available today and will admit to inpatient rehab today.  Attending MD is aware and has cleared patient for discharge to CIR today.  Call me for questions.  [phone]

## 2022-02-24 NOTE — Plan of Care (Signed)
  Problem: Education: Goal: Knowledge of disease or condition will improve Outcome: Progressing Goal: Knowledge of secondary prevention will improve (MUST DOCUMENT ALL) Outcome: Progressing Goal: Knowledge of patient specific risk factors will improve Elta [place] N/A or DELETE if not current risk factor) Outcome: Progressing   Problem: Ischemic Stroke/TIA Tissue Perfusion: Goal: Complications of ischemic stroke/TIA will be minimized Outcome: Progressing   Problem: Coping: Goal: Will verbalize positive feelings about self Outcome: Progressing Goal: Will identify appropriate support needs Outcome: Progressing   Problem: Health Behavior/Discharge Planning: Goal: Ability to manage health-related needs will improve Outcome: Progressing Goal: Goals will be collaboratively established with patient/family Outcome: Progressing   Problem: Self-Care: Goal: Ability to participate in self-care as condition permits will improve Outcome: Progressing Goal: Verbalization of feelings and concerns over difficulty with self-care will improve Outcome: Progressing Goal: Ability to communicate needs accurately will improve Outcome: Progressing   Problem: Nutrition: Goal: Dietary intake will improve Outcome: Progressing   Problem: Education: Goal: Ability to describe self-care measures that may prevent or decrease complications (Diabetes Survival Skills Education) will improve Outcome: Progressing Goal: Individualized Educational Video(s) Outcome: Progressing   Problem: Coping: Goal: Ability to adjust to condition or change in health will improve Outcome: Progressing   Problem: Fluid Volume: Goal: Ability to maintain a balanced intake and output will improve Outcome: Progressing   Problem: Health Behavior/Discharge Planning: Goal: Ability to identify and utilize available resources and services will improve Outcome: Progressing Goal: Ability to manage health-related needs will  improve Outcome: Progressing   Problem: Metabolic: Goal: Ability to maintain appropriate glucose levels will improve Outcome: Progressing   Problem: Nutritional: Goal: Maintenance of adequate nutrition will improve Outcome: Progressing Goal: Progress toward achieving an optimal weight will improve Outcome: Progressing   Problem: Skin Integrity: Goal: Risk for impaired skin integrity will decrease Outcome: Progressing   Problem: Tissue Perfusion: Goal: Adequacy of tissue perfusion will improve Outcome: Progressing   Problem: Education: Goal: Knowledge of General Education information will improve Description: Including pain rating scale, medication(s)/side effects and non-pharmacologic comfort measures Outcome: Progressing   Problem: Health Behavior/Discharge Planning: Goal: Ability to manage health-related needs will improve Outcome: Progressing   Problem: Clinical Measurements: Goal: Ability to maintain clinical measurements within normal limits will improve Outcome: Progressing Goal: Will remain free from infection Outcome: Progressing Goal: Diagnostic test results will improve Outcome: Progressing Goal: Respiratory complications will improve Outcome: Progressing Goal: Cardiovascular complication will be avoided Outcome: Progressing   Problem: Activity: Goal: Risk for activity intolerance will decrease Outcome: Progressing   Problem: Nutrition: Goal: Adequate nutrition will be maintained Outcome: Progressing   Problem: Coping: Goal: Level of anxiety will decrease Outcome: Progressing   Problem: Pain Managment: Goal: General experience of comfort will improve Outcome: Progressing   Problem: Safety: Goal: Ability to remain free from injury will improve Outcome: Progressing   Problem: Skin Integrity: Goal: Risk for impaired skin integrity will decrease Outcome: Progressing

## 2022-02-24 NOTE — Progress Notes (Signed)
Physical Therapy Treatment Patient Details Name: William Acevedo MRN: [id number] DOB: 1955-07-29 Today's Date: 02/24/2022   History of Present Illness Pt is a 67 y.o. M who presents 02/17/2022 with increased lethargy, vomiting, decreased intake x 3 days. MRI with 1. Acute to early subacute infarct in the medial left frontal lobe,  correlating with the left ACA territory and ACA-PCA watershed. No  evidence of hemorrhagic transformation or significant mass effect. 2. Remote infarcts involving the bilateral occipital cortex, right  parietal cortex, and posterior left frontal lobe white matter. PMH includes: HTN, HLD, CVA, uncontrolled DM II, renal infarct, major depressive disorder, polysubstance abuse (cocaine and marijuana), multiple recent admissions for abdominal pain, shingles, and multifocal acute infarcts.    PT Comments    Pt was very fatigued today. Pt ambulated 150 ft yesterday and only 20 ft today most likely demonstrating fatigue related to exertion yesterday. Discussed pacing activities with pt. Pt has very little re-call of education. Pt required encouragement to participate today in skilled physical therapy services.  Pt requires cueing for correct sequencing with gait and for safety as well as 1x Mod A to maintain upright balance due to posterior and R LOB. Pt requires significant increase in time to perform all activities. Due to pt current functional status, PLOF, home set up and available assistance at home recommending skilled physical therapy services in higher level of care on discharge from acute care hospital setting in order to decrease risk for immobility, falls, decrease care giver burden and decrease risk for injury/re-hospitalization.    Recommendations for follow up therapy are one component of a multi-disciplinary discharge planning process, led by the attending physician.  Recommendations may be updated based on patient status, additional functional criteria and insurance  authorization.  Follow Up Recommendations  Acute inpatient rehab (3hours/day)     Assistance Recommended at Discharge Frequent or constant Supervision/Assistance  Patient can return home with the following Assistance with cooking/housework;Assist for transportation;Help with stairs or ramp for entrance;A lot of help with walking and/or transfers   Equipment Recommendations  Other (comment) (defer to post acute)    Recommendations for Other Services       Precautions / Restrictions Precautions Precautions: Fall Restrictions Weight Bearing Restrictions: No     Mobility  Bed Mobility Overal bed mobility: Needs Assistance Bed Mobility: Supine to Sit     Supine to sit: Min assist Sit to supine: Min assist   General bed mobility comments: Pt requires Min A for bil LE for sitting to supine. Pt requires Min verbal cues for sequencing and initiation for going for supine to sitting EOB with significant increase in time. Patient Response: Cooperative  Transfers Overall transfer level: Needs assistance Equipment used: Rolling walker (2 wheels) Transfers: Sit to/from Stand Sit to Stand: Min guard           General transfer comment: Verbal cues for sit to stand for safe hand placement    Ambulation/Gait Ambulation/Gait assistance: Mod assist Gait Distance (Feet): 20 Feet Assistive device: Rolling walker (2 wheels) Gait Pattern/deviations: Step-to pattern, Decreased stance time - right, Decreased weight shift to right, Decreased step length - right, Decreased dorsiflexion - right Gait velocity: Decreased cadence. Gait velocity interpretation: <1.31 ft/sec, indicative of household ambulator   General Gait Details: Pt requires moderate verbal cues for correct sequencing with AD and with stepping including taking a large enough step with the RLE. Pt had 1x LOB requiring Mod A to prevent fall posterior to the R. When asked what  happened pt stated " You almost let me fall." Pt has  difficulty progressing the RLE which results in trailing.   Stairs Stairs:  (not performed today due to current functional status.)           Wheelchair Mobility    Modified Rankin (Stroke Patients Only) Modified Rankin (Stroke Patients Only) Pre-Morbid Rankin Score: No symptoms Modified Rankin: Moderately severe disability     Balance Overall balance assessment: Needs assistance Sitting-balance support: Feet supported Sitting balance-Leahy Scale: Fair     Standing balance support: During functional activity, Bilateral upper extremity supported Standing balance-Leahy Scale: Poor Standing balance comment: up to modA with LOB posterior and R.        Cognition Arousal/Alertness: Awake/alert Behavior During Therapy: Flat affect Overall Cognitive Status: No family/caregiver present to determine baseline cognitive functioning         Following Commands: Follows one step commands inconsistently Safety/Judgement: Decreased awareness of safety, Decreased awareness of deficits     General Comments: Pt with slow processing during functional mobility           General Comments General comments (skin integrity, edema, etc.): Pt was very fatigued today; needed encouragement to participate and only walked 20 ft before heading to the bed to lay down.      Pertinent Vitals/Pain Pain Assessment Pain Assessment: No/denies pain     PT Goals (current goals can now be found in the care plan section) Acute Rehab PT Goals Patient Stated Goal: unable PT Goal Formulation: With patient Time For Goal Achievement: 03/04/22 Potential to Achieve Goals: Fair Progress towards PT goals: Progressing toward goals    Frequency    Min 3X/week      PT Plan Current plan remains appropriate       AM-PAC PT "6 Clicks" Mobility   Outcome Measure  Help needed turning from your back to your side while in a flat bed without using bedrails?: A Little Help needed moving from lying  on your back to sitting on the side of a flat bed without using bedrails?: A Little Help needed moving to and from a bed to a chair (including a wheelchair)?: A Little Help needed standing up from a chair using your arms (e.g., wheelchair or bedside chair)?: A Little Help needed to walk in hospital room?: A Lot Help needed climbing 3-5 steps with a railing? : Total 6 Click Score: 15    End of Session Equipment Utilized During Treatment: Gait belt Activity Tolerance: Patient tolerated treatment well;Patient limited by fatigue Patient left: in bed;with call bell/phone within reach;with bed alarm set Nurse Communication: Mobility status PT Visit Diagnosis: Unsteadiness on feet (R26.81);Difficulty in walking, not elsewhere classified (R26.2)     Time: 8502-7741 PT Time Calculation (min) (ACUTE ONLY): 23 min  Charges:  $Gait Training: 8-22 mins $Therapeutic Activity: 8-22 mins                     Tomma Rakers, DPT, [place] Office: [phone] (Secure chat preferred)    Ander Purpura 02/24/2022, 3:02 PM

## 2022-02-24 NOTE — H&P (Signed)
Physical Medicine and Rehabilitation Admission H&P    Chief Complaint  Patient presents with   Stroke with functional deficits.     HPI: William Acevedo is a 67 year old RH-male with history of  CAD s/p NSTEMI, T2DM, ETOH/cocaine abuse with  neuropathy,endocarditis, cryptococcal PNA, CVA, schizoaffective disorder, PTSD, chronic GI issues;  who was admitted on 02/17/22 with reports of vomiting and decreased intake X 3 days progressing to increase lethargy and non-verbal state. He was found to have elevated BP.  UDS + cocaine. CT abdomen/pelvis negative for acute process. MRI brain showed acute to subacute infarct in medial left frontal lboe correlating to L-ACA and ACA-PCA watershed infarct as well as remote infarcts involving bilateral occipital cortex, right parietal cortex and posterior left frontal lobe while matter. CTA head/neck showed left A2 segment occlusion and severe stenosis of proxima cavernous segment of L-ICA.  Dr. Curly Shores felt that stroke was embolic from L-ICA v/s cardioembolic v/s cocaine related vasculopathy. 2D echo showed EF 55-60% with moderate calcification of aortic valve without stenosis and mild asymmetric LVH.   Dr. Leonie Man recommended DAPT X 3 weeks followed by Plavix alone as patient was reportedly taking ASA PTA. Lethargy is resolving with improvement in participation. He continues to be limited by right sided weakness with sensory deficits, cognitive deficits with poor carryover, decrease in initiation with limited verbal output and fatigue. CIR recommended due to functional decline.    Pt reports dry mouth- but has fluids at bedside. Also having difficulties" have to be careful about the right words".   Numb on LUE due to prior stroke per pt.   LBM yesterday- no constipation Peeing OK with condom catheter Denies pain.   Review of Systems  Unable to perform ROS: Mental acuity  Constitutional:  Negative for chills and weight loss.  HENT:  Negative for  hearing loss.   Eyes:  Negative for double vision.  Respiratory:  Negative for cough.   Cardiovascular:  Negative for chest pain and palpitations.  Gastrointestinal:  Negative for constipation.  Genitourinary:  Negative for dysuria.  Neurological:  Positive for sensory change (numbness right side) and weakness.  Psychiatric/Behavioral:  The patient is nervous/anxious and has insomnia.   All other systems reviewed and are negative.    Past Medical History:  Diagnosis Date   Carpal tunnel syndrome    Cocaine use    Depression    Diabetes mellitus without complication ([facility])    Endocarditis    ETOH abuse    Hypertensive urgency 04/14/2020   Neuropathy    PTSD (post-traumatic stress disorder)    Stroke (cerebrum) ([facility])    multiple   Zoster     Past Surgical History:  Procedure Laterality Date   BUBBLE STUDY  03/30/2020   Procedure: BUBBLE STUDY;  Surgeon: Jerline Pain, MD;  Location: [facility];  Service: Cardiovascular;;   TEE WITHOUT CARDIOVERSION N/A 03/30/2020   Procedure: TRANSESOPHAGEAL ECHOCARDIOGRAM (TEE);  Surgeon: Jerline Pain, MD;  Location: [facility] ENDOSCOPY;  Service: Cardiovascular;  Laterality: N/A;   TESTICLE TORSION REDUCTION      History reviewed. No pertinent family history.   Social History:  Lives with brother (plans on assisting after discharge). 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 He used in [employer].   Plans on d/c to  reports that he has been smoking cigarettes. He has been smoking an average of I packs per day. He has never used smokeless tobacco. He reports current alcohol use--one bottle per  day He reports current drug use. Drug: Cocaine.   Allergies  Allergen Reactions   Lisinopril Swelling    Angioedema Pt reports not allergic 06/07/20   Omeprazole     Other reaction(s): ANGIOEDEMA OF LIPS, Angioedema of tongue. Pt reports not allergic 06/07/20    Medications Prior to Admission  Medication Sig Dispense Refill   atorvastatin (LIPITOR) 40 MG  tablet Take 40 mg by mouth at bedtime.     FARXIGA 10 MG TABS tablet Take 10 mg by mouth daily.     gabapentin (NEURONTIN) 300 MG capsule Take 300 mg by mouth 2 (two) times daily.     hydrOXYzine (ATARAX) 25 MG tablet Take 25 mg by mouth at bedtime.     ACCU-CHEK GUIDE test strip      albuterol (PROVENTIL) (2.5 MG/3ML) 0.083% nebulizer solution Take 3 mLs by nebulization every 6 (six) hours as needed. (Patient not taking: Reported on 11/10/2021)     amLODipine (NORVASC) 10 MG tablet Take 10 mg by mouth daily.     aspirin EC 81 MG EC tablet Take 1 tablet (81 mg total) by mouth daily. Swallow whole. 30 tablet 11   blood glucose meter kit and supplies KIT Dispense based on patient and insurance preference. Use up to four times daily as directed. (FOR ICD-9 250.00, 250.01). 1 each 0   clopidogrel (PLAVIX) 75 MG tablet Take 1 tablet (75 mg total) by mouth daily. (Patient not taking: Reported on 01/08/2021) 20 tablet 0   DULoxetine (CYMBALTA) 60 MG capsule Take 60 mg by mouth daily. For chronic pain and mood     famotidine (PEPCID) 20 MG tablet Take 20 mg by mouth daily before breakfast.     fluconazole (DIFLUCAN) 200 MG tablet Take 200 mg by mouth daily.     folic acid (FOLVITE) 1 MG tablet Take 1 mg by mouth daily.     gabapentin (NEURONTIN) 100 MG capsule Take 100-300 mg by mouth See admin instructions. Take 1 capsule by mouth twice a day, then take 3 capsules at bedtime     glipiZIDE (GLUCOTROL) 10 MG tablet Take 10 mg by mouth daily before breakfast. (Patient not taking: Reported on 11/10/2021)     hydrALAZINE (APRESOLINE) 10 MG tablet Take 10 mg by mouth in the morning and at bedtime.     insulin glargine-yfgn (SEMGLEE) 100 UNIT/ML Pen Inject 15 Units into the skin 2 (two) times daily.     insulin lispro (HUMALOG) 100 UNIT/ML KwikPen Inject 0-30 Units into the skin as directed. INJECT SUBCUTANEOUSLY BASED ON BLOOD SUGAR RESULTS WITH MEALS THREE TIMES DAILY PRN FOR HIGH BLOOD SUGAR. MAX OF 30 UNITS  IN 24 HOURS 15 mL 0   Insulin Pen Needle 29G X 12MM MISC Per instructions 200 each 0   JARDIANCE 25 MG TABS tablet Take 25 mg by mouth every morning. (Patient not taking: Reported on 11/10/2021)     melatonin 3 MG TABS tablet Take 6 mg by mouth at bedtime as needed (for sleep).     metoprolol succinate (TOPROL-XL) 50 MG 24 hr tablet Take 25 mg by mouth daily. Take with or immediately following a meal. (Patient not taking: Reported on 01/24/2022)     nicotine polacrilex (COMMIT) 2 MG lozenge Take 2 mg by mouth as needed for smoking cessation. (Patient not taking: Reported on 11/11/2021)     OLANZapine (ZYPREXA) 5 MG tablet Take 5 mg by mouth at bedtime.     ondansetron (ZOFRAN) 4 MG tablet Take 4 mg  by mouth every 8 (eight) hours as needed for nausea or vomiting.     pantoprazole (PROTONIX) 40 MG tablet Take 40 mg by mouth daily. (Patient not taking: Reported on 11/10/2021)     polyethylene glycol powder (GLYCOLAX/MIRALAX) 17 GM/SCOOP powder Take 17 g by mouth 2 (two) times daily. Until daily soft stools  OTC (Patient not taking: Reported on 11/10/2021) 116 g 0   rivastigmine (EXELON) 1.5 MG capsule Take 1.5 mg by mouth 2 (two) times daily. (Patient not taking: Reported on 11/11/2021)     rosuvastatin (CRESTOR) 40 MG tablet Take 40 mg by mouth daily.     Suvorexant (BELSOMRA) 10 MG TABS Take 10 mg by mouth at bedtime.     traMADol (ULTRAM) 50 MG tablet Take 50 mg by mouth every 6 (six) hours as needed.     traZODone (DESYREL) 100 MG tablet Take 100 mg by mouth at bedtime.     [DISCONTINUED] insulin aspart (NOVOLOG) 100 UNIT/ML injection Inject 6 Units into the skin See admin instructions. INJECT 6 UNITS UNDER THE SKIN THREE TIMES A DAY WITH MEALS FOR DIABETES  "DISCARD AND OPEN A NEW PEN EVERY 28 DAYS" (Patient not taking: Reported on 01/24/2022)        Home: Home Living Family/patient expects to be discharged to:: Private residence Living Arrangements: Other relatives (brother) Available Help  at Discharge: Family Type of Home: House Home Access: Ramped entrance Home Layout: One level Bathroom Toilet: Handicapped height Home Equipment: BSC/3in1, Shower seat, Medical laboratory scientific officer - single point, Crutches, Wheelchair - manual Additional Comments: Information taken from 2022; unsure if still accurate. Recent IVC admission and cleared by psych   Functional History: Prior Function Prior Level of Function : Independent/Modified Independent  Functional Status:  Mobility: Bed Mobility Overal bed mobility: Needs Assistance Bed Mobility: Supine to Sit Supine to sit: Min assist Sit to supine: Min assist General bed mobility comments: Pt requires Min A for bil LE for sitting to supine. Pt requires Min verbal cues for sequencing and initiation for going for supine to sitting EOB with significant increase in time. Transfers Overall transfer level: Needs assistance Equipment used: Rolling walker (2 wheels) Transfers: Sit to/from Stand Sit to Stand: Min guard General transfer comment: Verbal cues for sit to stand for safe hand placement Ambulation/Gait Ambulation/Gait assistance: Mod assist Gait Distance (Feet): 20 Feet Assistive device: Rolling walker (2 wheels) Gait Pattern/deviations: Step-to pattern, Decreased stance time - right, Decreased weight shift to right, Decreased step length - right, Decreased dorsiflexion - right General Gait Details: Pt requires moderate verbal cues for correct sequencing with AD and with stepping including taking a large enough step with the RLE. Pt had 1x LOB requiring Mod A to prevent fall posterior to the R. When asked what happened pt stated " You almost let me fall." Pt has difficulty progressing the RLE which results in trailing. Gait velocity: Decreased cadence. Gait velocity interpretation: <1.31 ft/sec, indicative of household ambulator Stairs:  (not performed today due to current functional status.)    ADL: ADL Overall ADL's : Needs  assistance/impaired Eating/Feeding: Supervision/ safety, Sitting, Set up Eating/Feeding Details (indicate cue type and reason): Pt taking large bites requiring cues to slow down.  Pt used RUE to feed self oatmeal but would often "lose" his R hand under the table that he was eating off of and require cues to put the R hand back on top of table near tray so he would remember to use it to feed self. Grooming: Wash/dry hands,  Minimal assistance, Standing Grooming Details (indicate cue type and reason): Pt stood at sink to wash hands with slight posterior lean that pt as able to assist in recovering from.  Pt's eyes open and pt engaging in task using BUE equally this session. Upper Body Bathing: Set up, Sitting Lower Body Bathing: Minimal assistance, Sit to/from stand Upper Body Dressing : Set up, Sitting Lower Body Dressing: Moderate assistance, Sit to/from stand, Cueing for sequencing, Cueing for compensatory techniques Lower Body Dressing Details (indicate cue type and reason): Pt unable to donn socks on EOB without mod assist. Pt with poor sitting balance and required assist to turn to get leg on bed to attempt to donn them. Toilet Transfer: Minimal assistance, Ambulation Toilet Transfer Details (indicate cue type and reason): 1 person hand held assist Toileting- Clothing Manipulation and Hygiene: Min guard, Sit to/from stand, Cueing for sequencing Toileting - Clothing Manipulation Details (indicate cue type and reason): Pt cleaning self up at toilet. Walking to sink and perforing grooming tasks. Then lifting gown and urinating on floor standing by sink. Appeared to be behavioral in nature Functional mobility during ADLs: Moderate assistance General ADL Comments: hand held assist walking to sink while pt had L hand on IV pole.  Pt very unsteady on his feet when walking to sink taking very small steps.  Cognition: Cognition Overall Cognitive Status: No family/caregiver present to determine baseline  cognitive functioning Orientation Level: Oriented to person, Oriented to place, Disoriented to time, Disoriented to situation Cognition Arousal/Alertness: Awake/alert Behavior During Therapy: Flat affect Overall Cognitive Status: No family/caregiver present to determine baseline cognitive functioning Area of Impairment: Orientation, Attention, Memory, Following commands, Safety/judgement, Awareness, Problem solving Orientation Level: Disoriented to, Time, Situation Current Attention Level: Sustained Memory: Decreased short-term memory Following Commands: Follows one step commands inconsistently Safety/Judgement: Decreased awareness of safety, Decreased awareness of deficits Awareness: Intellectual Problem Solving: Slow processing, Decreased initiation, Requires verbal cues General Comments: Pt with slow processing during functional mobility Difficult to assess due to: Impaired communication   Blood pressure (!) 90/47, pulse 73, temperature 98.1 F (36.7 C), temperature source Oral, resp. rate 18, height 5\' 6"  (1.676 m), weight 45.2 kg, SpO2 99 %. Physical Exam Vitals and nursing note reviewed.  Constitutional:      Comments: Awake, alert, delayed responses; appears younger than stated age, NAD  HENT:     Head: Normocephalic and atraumatic.     Comments: Smile equal Tongue midline    Right Ear: External ear normal.     Left Ear: External ear normal.     Nose: Nose normal. No congestion.     Mouth/Throat:     Mouth: Mucous membranes are dry.     Pharynx: Oropharynx is clear. No oropharyngeal exudate.  Eyes:     General:        Right eye: No discharge.        Left eye: No discharge.     Extraocular Movements: Extraocular movements intact.  Cardiovascular:     Rate and Rhythm: Normal rate and regular rhythm.     Heart sounds: Normal heart sounds. No murmur heard.    No gallop.  Pulmonary:     Effort: Pulmonary effort is normal. No respiratory distress.     Breath sounds:  Normal breath sounds. No wheezing, rhonchi or rales.  Abdominal:     General: Bowel sounds are normal. There is no distension.     Palpations: Abdomen is soft.     Tenderness: There is no abdominal tenderness.  Genitourinary:    Comments: Condom catheter with medium amber urine Musculoskeletal:     Cervical back: Neck supple. No tenderness.     Comments: RUE 4+/5 in biceps, triceps, WE< grip and FA 4/5 LUE 5/5 in same muscles RLE- HF 4-/5; otherwise KE/DF and PF 4+/5 LLE 5/5 in same muscles   Skin:    General: Skin is warm and dry.     Comments: IV L forearm- looks OK  Neurological:     Mental Status: He is alert.     Sensory: Sensory deficit present.     Coordination: Coordination abnormal.     Comments: Oriented to self, place as hospital (unable to state name) or situation. Polite with limited verbal output. He was able to follow simple motor commands.   Aphasic; at word level- some word substitutions Decreased fine motor movement on RUE/hand Decreased to light touch in LUE; otherwise normal/intact in RUE and B/L LE's  Psychiatric:     Comments: Extremely flat affect, decreased interaction above and beyond the aphasia     Results for orders placed or performed during the hospital encounter of 02/17/22 (from the past 48 hour(s))  Glucose, capillary     Status: Abnormal   Collection Time: 02/22/22  9:19 PM  Result Value Ref Range   Glucose-Capillary 232 (H) 70 - 99 mg/dL    Comment: Glucose reference range applies only to samples taken after fasting for at least 8 hours.   Comment 1 Notify RN   Glucose, capillary     Status: Abnormal   Collection Time: 02/23/22  6:14 AM  Result Value Ref Range   Glucose-Capillary 124 (H) 70 - 99 mg/dL    Comment: Glucose reference range applies only to samples taken after fasting for at least 8 hours.   Comment 1 Notify RN   Glucose, capillary     Status: Abnormal   Collection Time: 02/23/22 11:19 AM  Result Value Ref Range    Glucose-Capillary 119 (H) 70 - 99 mg/dL    Comment: Glucose reference range applies only to samples taken after fasting for at least 8 hours.   Comment 1 Notify RN    Comment 2 Document in Chart   Glucose, capillary     Status: Abnormal   Collection Time: 02/23/22  4:15 PM  Result Value Ref Range   Glucose-Capillary 297 (H) 70 - 99 mg/dL    Comment: Glucose reference range applies only to samples taken after fasting for at least 8 hours.  Glucose, capillary     Status: Abnormal   Collection Time: 02/23/22  9:12 PM  Result Value Ref Range   Glucose-Capillary 302 (H) 70 - 99 mg/dL    Comment: Glucose reference range applies only to samples taken after fasting for at least 8 hours.   Comment 1 Notify RN    Comment 2 Document in Chart   Glucose, capillary     Status: Abnormal   Collection Time: 02/24/22  3:11 AM  Result Value Ref Range   Glucose-Capillary 181 (H) 70 - 99 mg/dL    Comment: Glucose reference range applies only to samples taken after fasting for at least 8 hours.   Comment 1 Notify RN    Comment 2 Document in Chart   Glucose, capillary     Status: Abnormal   Collection Time: 02/24/22  6:05 AM  Result Value Ref Range   Glucose-Capillary 280 (H) 70 - 99 mg/dL    Comment: Glucose reference range applies only to samples  taken after fasting for at least 8 hours.   Comment 1 Notify RN    Comment 2 Document in Chart   Glucose, capillary     Status: Abnormal   Collection Time: 02/24/22 11:19 AM  Result Value Ref Range   Glucose-Capillary 341 (H) 70 - 99 mg/dL    Comment: Glucose reference range applies only to samples taken after fasting for at least 8 hours.  Glucose, capillary     Status: Abnormal   Collection Time: 02/24/22  4:08 PM  Result Value Ref Range   Glucose-Capillary 303 (H) 70 - 99 mg/dL    Comment: Glucose reference range applies only to samples taken after fasting for at least 8 hours.   Comment 1 Notify RN    Comment 2 Document in Chart    No results  found.    Blood pressure (!) 90/47, pulse 73, temperature 98.1 F (36.7 C), temperature source Oral, resp. rate 18, height 5\' 6"  (1.676 m), weight 45.2 kg, SpO2 99 %.  Medical Problem List and Plan: 1. Functional deficits secondary to New L ACA/PCA watershed strokes with R hemiparesis and aphasia  -patient may  shower  -ELOS/Goals: 12-16 days supervision to min A PT and OT; and min-mod A for SLP 2.  Antithrombotics: -DVT/anticoagulation:  Pharmaceutical: Lovenox  -antiplatelet therapy: DAPT X 3 weeks followed by Plavix alone.  3. Pain Management: Tylenol prn.  4. Mood/Behavior/Sleep: LCSW to follow for evaluation and suppor.t   -antipsychotic agents:  Zyprexa/hs 5. Neuropsych/cognition: This patient is not capable of making decisions on his own behalf. 6. Skin/Wound Care: Routine pressure relief measures.  7. Fluids/Electrolytes/Nutrition: Monitor I/O. Nephro TID for nutritional supplement.  --Check CMET in am.  8. Hypotension/HTN: Monitor BP TID--IVF d/c yesterday --will set hold parameters for Toprol XL 9. T2DM: Hgb A1c-10.6 and poorly controlled. Was on insulin at home per chart --continue insulin glargine 15 units BID (increased to bid on 02/08) --on Farxiga--continue to hold glucotrol  --Monitor BS ac/hs and use SSI for elevated BS.  10. Neuropathy: On Cymbalta and Neurontin BID.  11. Chronic abdominal pain/GERD: Continue Pepcid and Protonix.  12. Schizoaffective disorder and PTSD: Continue Zyprex, trazodone and cymbalta.  13. Elevated lipids: Chol-291 w/LDL-217. Continue Lipitor.  13. H/o ETOH/cocaine/polysubstance use: was (+) for cocaine and Marijuana and EtOH-Has completed CIWA protocol.        Bary Leriche, PA-C 02/24/2022   I have personally performed a face to face diagnostic evaluation of this patient and formulated the key components of the plan.  Additionally, I have personally reviewed laboratory data, imaging studies, as well as relevant notes and concur with  the physician assistant's documentation above.   The patient's status has not changed from the original H&P.  Any changes in documentation from the acute care chart have been noted above.

## 2022-02-24 NOTE — H&P (Signed)
Physical Medicine and Rehabilitation Admission H&P        Chief Complaint  Patient presents with   Stroke with functional deficits.       HPI: William Acevedo is a 67 year old RH-male with history of  CAD s/p NSTEMI, T2DM, ETOH/cocaine abuse with  neuropathy,endocarditis, cryptococcal PNA, CVA, schizoaffective disorder, PTSD, chronic GI issues;  who was admitted on 02/17/22 with reports of vomiting and decreased intake X 3 days progressing to increase lethargy and non-verbal state. He was found to have elevated BP.  UDS + cocaine. CT abdomen/pelvis negative for acute process. MRI brain showed acute to subacute infarct in medial left frontal lboe correlating to L-ACA and ACA-PCA watershed infarct as well as remote infarcts involving bilateral occipital cortex, right parietal cortex and posterior left frontal lobe while matter. CTA head/neck showed left A2 segment occlusion and severe stenosis of proxima cavernous segment of L-ICA.  Dr. Curly Shores felt that stroke was embolic from L-ICA v/s cardioembolic v/s cocaine related vasculopathy. 2D echo showed EF 55-60% with moderate calcification of aortic valve without stenosis and mild asymmetric LVH.    Dr. Leonie Man recommended DAPT X 3 weeks followed by Plavix alone as patient was reportedly taking ASA PTA. Lethargy is resolving with improvement in participation. He continues to be limited by right sided weakness with sensory deficits, cognitive deficits with poor carryover, decrease in initiation with limited verbal output and fatigue. CIR recommended due to functional decline.     Pt reports dry mouth- but has fluids at bedside. Also having difficulties" have to be careful about the right words".    Numb on LUE due to prior stroke per pt.    LBM yesterday- no constipation Peeing OK with condom catheter Denies pain.    Review of Systems  Unable to perform ROS: Mental acuity  Constitutional:  Negative for chills and weight loss.  HENT:   Negative for hearing loss.   Eyes:  Negative for double vision.  Respiratory:  Negative for cough.   Cardiovascular:  Negative for chest pain and palpitations.  Gastrointestinal:  Negative for constipation.  Genitourinary:  Negative for dysuria.  Neurological:  Positive for sensory change (numbness right side) and weakness.  Psychiatric/Behavioral:  The patient is nervous/anxious and has insomnia.   All other systems reviewed and are negative.           Past Medical History:  Diagnosis Date   Carpal tunnel syndrome     Cocaine use     Depression     Diabetes mellitus without complication ([place])     Endocarditis     ETOH abuse     Hypertensive urgency 04/14/2020   Neuropathy     PTSD (post-traumatic stress disorder)     Stroke (cerebrum) (HCC)      multiple   Zoster             Past Surgical History:  Procedure Laterality Date   BUBBLE STUDY   03/30/2020    Procedure: BUBBLE STUDY;  Surgeon: Jerline Pain, MD;  Location: [facility];  Service: Cardiovascular;;   TEE WITHOUT CARDIOVERSION N/A 03/30/2020    Procedure: TRANSESOPHAGEAL ECHOCARDIOGRAM (TEE);  Surgeon: Jerline Pain, MD;  Location: [facility] ENDOSCOPY;  Service: Cardiovascular;  Laterality: N/A;   TESTICLE TORSION REDUCTION          History reviewed. No pertinent family history.     Social History:  Lives with brother (plans on assisting after discharge). 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 He used in [employer]   Plans on d/c to  reports that he has been smoking cigarettes. He has been smoking an average of I packs per day. He has never used smokeless tobacco. He reports current alcohol use--one bottle per day He reports current drug use. Drug: Cocaine.          Allergies  Allergen Reactions   Lisinopril Swelling      Angioedema Pt reports not allergic 06/07/20   Omeprazole        Other reaction(s): ANGIOEDEMA OF LIPS, Angioedema of tongue. Pt reports not allergic 06/07/20            Medications Prior to  Admission  Medication Sig Dispense Refill   atorvastatin (LIPITOR) 40 MG tablet Take 40 mg by mouth at bedtime.       FARXIGA 10 MG TABS tablet Take 10 mg by mouth daily.       gabapentin (NEURONTIN) 300 MG capsule Take 300 mg by mouth 2 (two) times daily.       hydrOXYzine (ATARAX) 25 MG tablet Take 25 mg by mouth at bedtime.       ACCU-CHEK GUIDE test strip         albuterol (PROVENTIL) (2.5 MG/3ML) 0.083% nebulizer solution Take 3 mLs by nebulization every 6 (six) hours as needed. (Patient not taking: Reported on 11/10/2021)       amLODipine (NORVASC) 10 MG tablet Take 10 mg by mouth daily.       aspirin EC 81 MG EC tablet Take 1 tablet (81 mg total) by mouth daily. Swallow whole. 30 tablet 11   blood glucose meter kit and supplies KIT Dispense based on patient and insurance preference. Use up to four times daily as directed. (FOR ICD-9 250.00, 250.01). 1 each 0   clopidogrel (PLAVIX) 75 MG tablet Take 1 tablet (75 mg total) by mouth daily. (Patient not taking: Reported on 01/08/2021) 20 tablet 0   DULoxetine (CYMBALTA) 60 MG capsule Take 60 mg by mouth daily. For chronic pain and mood       famotidine (PEPCID) 20 MG tablet Take 20 mg by mouth daily before breakfast.       fluconazole (DIFLUCAN) 200 MG tablet Take 200 mg by mouth daily.       folic acid (FOLVITE) 1 MG tablet Take 1 mg by mouth daily.       gabapentin (NEURONTIN) 100 MG capsule Take 100-300 mg by mouth See admin instructions. Take 1 capsule by mouth twice a day, then take 3 capsules at bedtime       glipiZIDE (GLUCOTROL) 10 MG tablet Take 10 mg by mouth daily before breakfast. (Patient not taking: Reported on 11/10/2021)       hydrALAZINE (APRESOLINE) 10 MG tablet Take 10 mg by mouth in the morning and at bedtime.       insulin glargine-yfgn (SEMGLEE) 100 UNIT/ML Pen Inject 15 Units into the skin 2 (two) times daily.       insulin lispro (HUMALOG) 100 UNIT/ML KwikPen Inject 0-30 Units into the skin as directed. INJECT  SUBCUTANEOUSLY BASED ON BLOOD SUGAR RESULTS WITH MEALS THREE TIMES DAILY PRN FOR HIGH BLOOD SUGAR. MAX OF 30 UNITS IN 24 HOURS 15 mL 0   Insulin Pen Needle 29G X MISC Per instructions 200 each 0   JARDIANCE 25 MG TABS tablet Take 25 mg by mouth every morning. (Patient not taking: Reported on 11/10/2021)       melatonin  3 MG TABS tablet Take 6 mg by mouth at bedtime as needed (for sleep).       metoprolol succinate (TOPROL-XL) 50 MG 24 hr tablet Take 25 mg by mouth daily. Take with or immediately following a meal. (Patient not taking: Reported on 01/24/2022)       nicotine polacrilex (COMMIT) 2 MG lozenge Take 2 mg by mouth as needed for smoking cessation. (Patient not taking: Reported on 11/11/2021)       OLANZapine (ZYPREXA) 5 MG tablet Take 5 mg by mouth at bedtime.       ondansetron (ZOFRAN) 4 MG tablet Take 4 mg by mouth every 8 (eight) hours as needed for nausea or vomiting.       pantoprazole (PROTONIX) 40 MG tablet Take 40 mg by mouth daily. (Patient not taking: Reported on 11/10/2021)       polyethylene glycol powder (GLYCOLAX/MIRALAX) 17 GM/SCOOP powder Take 17 g by mouth 2 (two) times daily. Until daily soft stools   OTC (Patient not taking: Reported on 11/10/2021) 116 g 0   rivastigmine (EXELON) 1.5 MG capsule Take 1.5 mg by mouth 2 (two) times daily. (Patient not taking: Reported on 11/11/2021)       rosuvastatin (CRESTOR) 40 MG tablet Take 40 mg by mouth daily.       Suvorexant (BELSOMRA) 10 MG TABS Take 10 mg by mouth at bedtime.       traMADol (ULTRAM) 50 MG tablet Take 50 mg by mouth every 6 (six) hours as needed.       traZODone (DESYREL) 100 MG tablet Take 100 mg by mouth at bedtime.       [DISCONTINUED] insulin aspart (NOVOLOG) 100 UNIT/ML injection Inject 6 Units into the skin See admin instructions. INJECT 6 UNITS UNDER THE SKIN THREE TIMES A DAY WITH MEALS FOR DIABETES  "DISCARD AND OPEN A NEW PEN EVERY 28 DAYS" (Patient not taking: Reported on 01/24/2022)               Home: Home Living Family/patient expects to be discharged to:: Private residence Living Arrangements: Other relatives (brother) Available Help at Discharge: Family Type of Home: House Home Access: Ramped entrance Home Layout: One level Bathroom Toilet: Handicapped height Home Equipment: BSC/3in1, Shower seat, Medical laboratory scientific officer - single point, Crutches, Wheelchair - manual Additional Comments: Information taken from 2022; unsure if still accurate. Recent IVC admission and cleared by psych   Functional History: Prior Function Prior Level of Function : Independent/Modified Independent   Functional Status:  Mobility: Bed Mobility Overal bed mobility: Needs Assistance Bed Mobility: Supine to Sit Supine to sit: Min assist Sit to supine: Min assist General bed mobility comments: Pt requires Min A for bil LE for sitting to supine. Pt requires Min verbal cues for sequencing and initiation for going for supine to sitting EOB with significant increase in time. Transfers Overall transfer level: Needs assistance Equipment used: Rolling walker (2 wheels) Transfers: Sit to/from Stand Sit to Stand: Min guard General transfer comment: Verbal cues for sit to stand for safe hand placement Ambulation/Gait Ambulation/Gait assistance: Mod assist Gait Distance (Feet): 20 Feet Assistive device: Rolling walker (2 wheels) Gait Pattern/deviations: Step-to pattern, Decreased stance time - right, Decreased weight shift to right, Decreased step length - right, Decreased dorsiflexion - right General Gait Details: Pt requires moderate verbal cues for correct sequencing with AD and with stepping including taking a large enough step with the RLE. Pt had 1x LOB requiring Mod A to prevent fall posterior to the R.  When asked what happened pt stated " You almost let me fall." Pt has difficulty progressing the RLE which results in trailing. Gait velocity: Decreased cadence. Gait velocity interpretation: <1.31 ft/sec, indicative  of household ambulator Stairs:  (not performed today due to current functional status.)   ADL: ADL Overall ADL's : Needs assistance/impaired Eating/Feeding: Supervision/ safety, Sitting, Set up Eating/Feeding Details (indicate cue type and reason): Pt taking large bites requiring cues to slow down.  Pt used RUE to feed self oatmeal but would often "lose" his R hand under the table that he was eating off of and require cues to put the R hand back on top of table near tray so he would remember to use it to feed self. Grooming: Wash/dry hands, Minimal assistance, Standing Grooming Details (indicate cue type and reason): Pt stood at sink to wash hands with slight posterior lean that pt as able to assist in recovering from.  Pt's eyes open and pt engaging in task using BUE equally this session. Upper Body Bathing: Set up, Sitting Lower Body Bathing: Minimal assistance, Sit to/from stand Upper Body Dressing : Set up, Sitting Lower Body Dressing: Moderate assistance, Sit to/from stand, Cueing for sequencing, Cueing for compensatory techniques Lower Body Dressing Details (indicate cue type and reason): Pt unable to donn socks on EOB without mod assist. Pt with poor sitting balance and required assist to turn to get leg on bed to attempt to donn them. Toilet Transfer: Minimal assistance, Ambulation Toilet Transfer Details (indicate cue type and reason): 1 person hand held assist Toileting- Clothing Manipulation and Hygiene: Min guard, Sit to/from stand, Cueing for sequencing Toileting - Clothing Manipulation Details (indicate cue type and reason): Pt cleaning self up at toilet. Walking to sink and perforing grooming tasks. Then lifting gown and urinating on floor standing by sink. Appeared to be behavioral in nature Functional mobility during ADLs: Moderate assistance General ADL Comments: hand held assist walking to sink while pt had L hand on IV pole.  Pt very unsteady on his feet when walking to sink  taking very small steps.   Cognition: Cognition Overall Cognitive Status: No family/caregiver present to determine baseline cognitive functioning Orientation Level: Oriented to person, Oriented to place, Disoriented to time, Disoriented to situation Cognition Arousal/Alertness: Awake/alert Behavior During Therapy: Flat affect Overall Cognitive Status: No family/caregiver present to determine baseline cognitive functioning Area of Impairment: Orientation, Attention, Memory, Following commands, Safety/judgement, Awareness, Problem solving Orientation Level: Disoriented to, Time, Situation Current Attention Level: Sustained Memory: Decreased short-term memory Following Commands: Follows one step commands inconsistently Safety/Judgement: Decreased awareness of safety, Decreased awareness of deficits Awareness: Intellectual Problem Solving: Slow processing, Decreased initiation, Requires verbal cues General Comments: Pt with slow processing during functional mobility Difficult to assess due to: Impaired communication     Blood pressure (!) 90/47, pulse 73, temperature 98.1 F (36.7 C), temperature source Oral, resp. rate 18, height 5\' 6"  (1.676 m), weight 45.2 kg, SpO2 99 %. Physical Exam Vitals and nursing note reviewed.  Constitutional:      Comments: Awake, alert, delayed responses; appears younger than stated age, NAD  HENT:     Head: Normocephalic and atraumatic.     Comments: Smile equal Tongue midline    Right Ear: External ear normal.     Left Ear: External ear normal.     Nose: Nose normal. No congestion.     Mouth/Throat:     Mouth: Mucous membranes are dry.     Pharynx: Oropharynx is clear. No oropharyngeal  exudate.  Eyes:     General:        Right eye: No discharge.        Left eye: No discharge.     Extraocular Movements: Extraocular movements intact.  Cardiovascular:     Rate and Rhythm: Normal rate and regular rhythm.     Heart sounds: Normal heart sounds. No  murmur heard.    No gallop.  Pulmonary:     Effort: Pulmonary effort is normal. No respiratory distress.     Breath sounds: Normal breath sounds. No wheezing, rhonchi or rales.  Abdominal:     General: Bowel sounds are normal. There is no distension.     Palpations: Abdomen is soft.     Tenderness: There is no abdominal tenderness.  Genitourinary:    Comments: Condom catheter with medium amber urine Musculoskeletal:     Cervical back: Neck supple. No tenderness.     Comments: RUE 4+/5 in biceps, triceps, WE< grip and FA 4/5 LUE 5/5 in same muscles RLE- HF 4-/5; otherwise KE/DF and PF 4+/5 LLE 5/5 in same muscles   Skin:    General: Skin is warm and dry.     Comments: IV L forearm- looks OK  Neurological:     Mental Status: He is alert.     Sensory: Sensory deficit present.     Coordination: Coordination abnormal.     Comments: Oriented to self, place as hospital (unable to state name) or situation. Polite with limited verbal output. He was able to follow simple motor commands.    Aphasic; at word level- some word substitutions Decreased fine motor movement on RUE/hand Decreased to light touch in LUE; otherwise normal/intact in RUE and B/L LE's  Psychiatric:     Comments: Extremely flat affect, decreased interaction above and beyond the aphasia        Lab Results Last 48 Hours        Results for orders placed or performed during the hospital encounter of 02/17/22 (from the past 48 hour(s))  Glucose, capillary     Status: Abnormal    Collection Time: 02/22/22  9:19 PM  Result Value Ref Range    Glucose-Capillary 232 (H) 70 - 99 mg/dL      Comment: Glucose reference range applies only to samples taken after fasting for at least 8 hours.    Comment 1 Notify RN    Glucose, capillary     Status: Abnormal    Collection Time: 02/23/22  6:14 AM  Result Value Ref Range    Glucose-Capillary 124 (H) 70 - 99 mg/dL      Comment: Glucose reference range applies only to samples taken  after fasting for at least 8 hours.    Comment 1 Notify RN    Glucose, capillary     Status: Abnormal    Collection Time: 02/23/22 11:19 AM  Result Value Ref Range    Glucose-Capillary 119 (H) 70 - 99 mg/dL      Comment: Glucose reference range applies only to samples taken after fasting for at least 8 hours.    Comment 1 Notify RN      Comment 2 Document in Chart    Glucose, capillary     Status: Abnormal    Collection Time: 02/23/22  4:15 PM  Result Value Ref Range    Glucose-Capillary 297 (H) 70 - 99 mg/dL      Comment: Glucose reference range applies only to samples taken after fasting for at least 8  hours.  Glucose, capillary     Status: Abnormal    Collection Time: 02/23/22  9:12 PM  Result Value Ref Range    Glucose-Capillary 302 (H) 70 - 99 mg/dL      Comment: Glucose reference range applies only to samples taken after fasting for at least 8 hours.    Comment 1 Notify RN      Comment 2 Document in Chart    Glucose, capillary     Status: Abnormal    Collection Time: 02/24/22  3:11 AM  Result Value Ref Range    Glucose-Capillary 181 (H) 70 - 99 mg/dL      Comment: Glucose reference range applies only to samples taken after fasting for at least 8 hours.    Comment 1 Notify RN      Comment 2 Document in Chart    Glucose, capillary     Status: Abnormal    Collection Time: 02/24/22  6:05 AM  Result Value Ref Range    Glucose-Capillary 280 (H) 70 - 99 mg/dL      Comment: Glucose reference range applies only to samples taken after fasting for at least 8 hours.    Comment 1 Notify RN      Comment 2 Document in Chart    Glucose, capillary     Status: Abnormal    Collection Time: 02/24/22 11:19 AM  Result Value Ref Range    Glucose-Capillary 341 (H) 70 - 99 mg/dL      Comment: Glucose reference range applies only to samples taken after fasting for at least 8 hours.  Glucose, capillary     Status: Abnormal    Collection Time: 02/24/22  4:08 PM  Result Value Ref Range     Glucose-Capillary 303 (H) 70 - 99 mg/dL      Comment: Glucose reference range applies only to samples taken after fasting for at least 8 hours.    Comment 1 Notify RN      Comment 2 Document in Chart        Imaging Results (Last 48 hours)  No results found.         Blood pressure (!) 90/47, pulse 73, temperature 98.1 F (36.7 C), temperature source Oral, resp. rate 18, height 5\' 6"  (1.676 m), weight 45.2 kg, SpO2 99 %.   Medical Problem List and Plan: 1. Functional deficits secondary to [place] L ACA/PCA watershed strokes with R hemiparesis and aphasia             -patient may  shower             -ELOS/Goals: 12-16 days supervision to min A PT and OT; and min-mod A for SLP 2.  Antithrombotics: -DVT/anticoagulation:  Pharmaceutical: Lovenox             -antiplatelet therapy: DAPT X 3 weeks followed by Plavix alone.  3. Pain Management: Tylenol prn.  4. Mood/Behavior/Sleep: LCSW to follow for evaluation and suppor.t              -antipsychotic agents:  Zyprexa/hs 5. Neuropsych/cognition: This patient is not capable of making decisions on his own behalf. 6. Skin/Wound Care: Routine pressure relief measures.  7. Fluids/Electrolytes/Nutrition: Monitor I/O. Nephro TID for nutritional supplement.  --Check CMET in am.  8. Hypotension/HTN: Monitor BP TID--IVF d/c yesterday --will set hold parameters for Toprol XL 9. T2DM: Hgb A1c-10.6 and poorly controlled. Was on insulin at home per chart --continue insulin glargine 15 units BID (increased to bid on 02/08) --  on Farxiga--continue to hold glucotrol             --Monitor BS ac/hs and use SSI for elevated BS.  10. Neuropathy: On Cymbalta and Neurontin BID.  11. Chronic abdominal pain/GERD: Continue Pepcid and Protonix.  12. Schizoaffective disorder and PTSD: Continue Zyprex, trazodone and cymbalta.  13. Elevated lipids: Chol-291 w/LDL-217. Continue Lipitor.  13. H/o ETOH/cocaine/polysubstance use: was (+) for cocaine and Marijuana and EtOH-Has  completed CIWA protocol.              Jacquelynn Creeamela S Love, PA-C 02/24/2022     I have personally performed a face to face diagnostic evaluation of this patient and formulated the key components of the plan.  Additionally, I have personally reviewed laboratory data, imaging studies, as well as relevant notes and concur with the physician assistant's documentation above.   The patient's status has not changed from the original H&P.  Any changes in documentation from the acute care chart have been noted above.

## 2022-02-24 NOTE — Progress Notes (Signed)
Pt is for transfer to [facility] as reported by RN Rolla Plate, report was already given to receiving nurse, pt still alert and not in distress on room air, with condom catheter and on carb modified diet, dinner  consumed 100%

## 2022-02-25 ENCOUNTER — Other Ambulatory Visit (HOSPITAL_COMMUNITY): Payer: Self-pay

## 2022-02-25 DIAGNOSIS — I6389 Other cerebral infarction: Secondary | ICD-10-CM | POA: Diagnosis not present

## 2022-02-25 LAB — COMPREHENSIVE METABOLIC PANEL
ALT: 18 U/L (ref 0–44)
AST: 21 U/L (ref 15–41)
Albumin: 2.3 g/dL — ABNORMAL LOW (ref 3.5–5.0)
Alkaline Phosphatase: 40 U/L (ref 38–126)
Anion gap: 8 (ref 5–15)
BUN: 30 mg/dL — ABNORMAL HIGH (ref 8–23)
CO2: 29 mmol/L (ref 22–32)
Calcium: 8.7 mg/dL — ABNORMAL LOW (ref 8.9–10.3)
Chloride: 99 mmol/L (ref 98–111)
Creatinine, Ser: 0.99 mg/dL (ref 0.61–1.24)
GFR, Estimated: >60 mL/min (ref >60)
Glucose, Bld: 91 mg/dL (ref 70–99)
Potassium: 3.7 mmol/L (ref 3.5–5.1)
Sodium: 136 mmol/L (ref 135–145)
Total Bilirubin: <0.1 mg/dL — ABNORMAL LOW (ref 0.3–1.2)
Total Protein: 5.3 g/dL — ABNORMAL LOW (ref 6.5–8.1)

## 2022-02-25 LAB — GLUCOSE, CAPILLARY
Glucose-Capillary: 166 mg/dL — ABNORMAL HIGH (ref 70–99)
Glucose-Capillary: 165 mg/dL — ABNORMAL HIGH (ref 70–99)
Glucose-Capillary: 173 mg/dL — ABNORMAL HIGH (ref 70–99)

## 2022-02-25 LAB — CBC WITH DIFFERENTIAL/PLATELET
Abs Immature Granulocytes: 0.02 10*3/uL (ref 0.00–0.07)
Basophils Absolute: 0 10*3/uL (ref 0.0–0.1)
Basophils Relative: 0 %
Eosinophils Absolute: 0 10*3/uL (ref 0.0–0.5)
Eosinophils Relative: 0 %
HCT: 32.3 % — ABNORMAL LOW (ref 39.0–52.0)
Hemoglobin: 10.8 g/dL — ABNORMAL LOW (ref 13.0–17.0)
Immature Granulocytes: 0 %
Lymphocytes Relative: 36 %
Lymphs Abs: 2.2 10*3/uL (ref 0.7–4.0)
MCH: 29 pg (ref 26.0–34.0)
MCHC: 33.4 g/dL (ref 30.0–36.0)
MCV: 86.8 fL (ref 80.0–100.0)
Monocytes Absolute: 0.7 10*3/uL (ref 0.1–1.0)
Monocytes Relative: 11 %
Neutro Abs: 3.2 10*3/uL (ref 1.7–7.7)
Neutrophils Relative %: 53 %
Platelets: 199 10*3/uL (ref 150–400)
RBC: 3.72 MIL/uL — ABNORMAL LOW (ref 4.22–5.81)
RDW: 13.1 % (ref 11.5–15.5)
WBC: 6.1 10*3/uL (ref 4.0–10.5)
nRBC: 0 % (ref 0.0–0.2)

## 2022-02-25 LAB — MAGNESIUM: Magnesium: 2.3 mg/dL (ref 1.7–2.4)

## 2022-02-25 MED ORDER — CARVEDILOL 3.125 MG PO TABS
3.1250 mg | ORAL_TABLET | Freq: Two times a day (BID) | ORAL | Status: DC
  Administered 2022-02-26 – 2022-02-28 (×5): 3.125 mg via ORAL
  Filled 2022-02-25 (×5): qty 1

## 2022-02-25 MED ORDER — METFORMIN HCL 500 MG PO TABS
500.0000 mg | ORAL_TABLET | Freq: Every day | ORAL | Status: DC
  Administered 2022-02-26 – 2022-02-27 (×2): 500 mg via ORAL
  Filled 2022-02-25 (×3): qty 1

## 2022-02-25 NOTE — Evaluation (Signed)
Physical Therapy Assessment and Plan  Patient Details  Name: William Acevedo MRN: [id number] Date of Birth: 07/19/1955  PT Diagnosis: Abnormality of gait, Ataxia, Ataxic gait, Coordination disorder, Hemiplegia dominant, Impaired cognition, Impaired sensation, and Muscle weakness Rehab Potential: Good ELOS: 10-12 days   Today's Date: 02/25/2022 PT Individual Time: 1405-1500 PT Individual Time Calculation (min): 55 min    Hospital Problem: Principal Problem:   Acute ischemic multifocal multiple vascular territories stroke [facility]) Active Problems:   DM2 (diabetes mellitus, type 2) ([place])   CVA (cerebral vascular accident) ([place])   Schizoaffective disorder ([place])   Past Medical History:  Past Medical History:  Diagnosis Date   Carpal tunnel syndrome    Cocaine use    Depression    Diabetes mellitus without complication ([place])    Endocarditis    ETOH abuse    Hypertensive urgency 04/14/2020   Neuropathy    PTSD (post-traumatic stress disorder)    Stroke (cerebrum) ([place])    multiple   Zoster    Past Surgical History:  Past Surgical History:  Procedure Laterality Date   BUBBLE STUDY  03/30/2020   Procedure: BUBBLE STUDY;  Surgeon: Jerline Pain, MD;  Location: [place];  Service: Cardiovascular;;   TEE WITHOUT CARDIOVERSION N/A 03/30/2020   Procedure: TRANSESOPHAGEAL ECHOCARDIOGRAM (TEE);  Surgeon: Jerline Pain, MD;  Location: [facility] ENDOSCOPY;  Service: Cardiovascular;  Laterality: N/A;   TESTICLE TORSION REDUCTION      Assessment & Plan Clinical Impression: Patient is a 66 year old RH-male with history of  CAD s/p NSTEMI, T2DM, ETOH/cocaine abuse with  neuropathy,endocarditis, cryptococcal PNA, CVA, schizoaffective disorder, PTSD, chronic GI issues;  who was admitted on 02/17/22 with reports of vomiting and decreased intake X 3 days progressing to increase lethargy and non-verbal state. He was found to have elevated BP.  UDS + cocaine. CT abdomen/pelvis negative for acute  process. MRI brain showed acute to subacute infarct in medial left frontal lboe correlating to L-ACA and ACA-PCA watershed infarct as well as remote infarcts involving bilateral occipital cortex, right parietal cortex and posterior left frontal lobe while matter. CTA head/neck showed left A2 segment occlusion and severe stenosis of proxima cavernous segment of L-ICA.  Dr. Curly Shores felt that stroke was embolic from L-ICA v/s cardioembolic v/s cocaine related vasculopathy. 2D echo showed EF 55-60% with moderate calcification of aortic valve without stenosis and mild asymmetric LVH.    Dr. Leonie Man recommended DAPT X 3 weeks followed by Plavix alone as patient was reportedly taking ASA PTA. Lethargy is resolving with improvement in participation. He continues to be limited by right sided weakness with sensory deficits, cognitive deficits with poor carryover, decrease in initiation with limited verbal output and fatigue Patient transferred to CIR on 02/24/2022 .   Patient currently requires min with mobility secondary to muscle weakness and muscle joint tightness, decreased cardiorespiratoy endurance, motor apraxia, ataxia, and decreased coordination, decreased attention to right, decreased initiation, decreased attention, decreased awareness, decreased problem solving, decreased safety awareness, decreased memory, and delayed processing, and decreased sitting balance, decreased standing balance, decreased postural control, hemiplegia, and decreased balance strategies.  Prior to hospitalization, patient was modified independent  with mobility and lived with Family (brother) in a House home.  Home access is  Ramped entrance.  Patient will benefit from skilled PT intervention to maximize safe functional mobility, minimize fall risk, and decrease caregiver burden for planned discharge home with 24 hour supervision.  Anticipate patient will benefit from follow up [place] at discharge.  PT - End of Session Activity Tolerance:  Tolerates 10 - 20 min activity with multiple rests Endurance Deficit: Yes Endurance Deficit Description: rest breaks within BADL tasks PT Assessment Rehab Potential (ACUTE/IP ONLY): Good PT Barriers to Discharge: Decreased caregiver support;Home environment access/layout;Incontinence;Lack of/limited family support;Insurance for SNF coverage;Medication compliance;Behavior PT Patient demonstrates impairments in the following area(s): Balance;Behavior;Endurance;Motor;Perception;Safety;Sensory PT Transfers Functional Problem(s): Bed Mobility;Bed to Chair;Car;Furniture;Floor PT Locomotion Functional Problem(s): Ambulation;Wheelchair Mobility;Stairs PT Plan PT Intensity: Minimum of 1-2 x/day ,45 to 90 minutes PT Frequency: 5 out of 7 days PT Duration Estimated Length of Stay: 10-12 days PT Treatment/Interventions: Ambulation/gait training;Balance/vestibular training;Cognitive remediation/compensation;Disease management/prevention;Discharge planning;Community reintegration;DME/adaptive equipment instruction;Functional mobility training;Patient/family education;Neuromuscular re-education;Functional electrical stimulation;Pain management;Splinting/orthotics;Skin care/wound management;Psychosocial support;Stair training;Therapeutic Activities;UE/LE Strength taining/ROM;Wheelchair propulsion/positioning;UE/LE Coordination activities;Therapeutic Exercise;Visual/perceptual remediation/compensation PT Transfers Anticipated Outcome(s): supervision Assist PT Locomotion Anticipated Outcome(s): supervision assist with LRAD ambulatory PT Recommendation Recommendations for Other Services: Therapeutic Recreation consult Therapeutic Recreation Interventions: Stress management Follow Up Recommendations: Home health PT Patient destination: Home Equipment Recommended: To be determined   PT Evaluation Precautions/Restrictions Precautions Precautions: Fall Restrictions Weight Bearing Restrictions:  No General Chart Reviewed: Yes Family/Caregiver Present: No Vital Signs Pain Pain Assessment Pain Scale: 0-10 Pain Score: 0-No pain Pain Interference Pain Interference Pain Effect on Sleep: 0. Does not apply - I have not had any pain or hurting in the past 5 days Pain Interference with Therapy Activities: 1. Rarely or not at all Pain Interference with Day-to-Day Activities: 1. Rarely or not at all Home Living/Prior [place] Available Help at Discharge: Family;Available PRN/intermittently (Per pt, brother works during the day? Per chart review, pt's brother available to provide 24/7 supervision and assistance post-dc.) Type of Home: House Home Access: Ramped entrance Home Layout: One level Bathroom Shower/Tub: Tub/shower unit Additional Comments: Need to clarify home information  Lives With: Family (brother) Prior Function Level of Independence: Other (comment) (inconsistent report of PLOF. pt reports having RW and SPC< but not using.) Driving: No Vocation: Retired Vision/Perception  Vision - Risk analyst: Within Advertising copywriter Alignment/Gaze Preference: Head turned Tracking/Visual Pursuits: Requires cues, head turns, or add eye shifts to track Saccades: Additional head turns occurred during testing;Additional eye shifts occurred during testing Perception Perception: Impaired Inattention/Neglect: Does not attend to right visual field Praxis Praxis: Impaired Praxis Impairment Details: Ideomotor  Cognition Overall Cognitive Status: No family/caregiver present to determine baseline cognitive functioning Arousal/Alertness: Awake/alert Attention: Sustained;Focused Focused Attention: Impaired Focused Attention Impairment: Verbal basic;Functional basic Sustained Attention: Impaired Sustained Attention Impairment: Verbal basic;Functional basic Memory: Impaired Memory Impairment: Retrieval deficit;Decreased recall of new information;Decreased short  term memory;Storage deficit (4/5 immediate recall; delayed recall with distraction -) Decreased Short Term Memory: Verbal basic;Functional basic Awareness: Impaired Awareness Impairment: Intellectual impairment Problem Solving: Impaired Problem Solving Impairment: Verbal basic Safety/Judgment: Impaired Sensation Sensation Light Touch: Appears Intact Additional Comments: mild extinction on the R distal LE Coordination Gross Motor Movements are Fluid and Coordinated: No Fine Motor Movements are Fluid and Coordinated: No Coordination and Movement Description: noted to have dyskinetic movement in all 4 extremities R side worse than L. mild festination with gait intermittently Finger Nose Finger Test: dysmetria Heel Shin Test: ataxia on the RLE. dysmetria on the RLE. Motor  Motor Motor: Hemiplegia;Ataxia;Motor apraxia Motor - Skilled Clinical Observations: R side hemiplegia. ataxia. and generalized dysmetria. ocassional festination with gait   Trunk/Postural Assessment  Cervical Assessment Cervical Assessment: Exceptions to [facility] (mild L deviation) Thoracic Assessment Thoracic Assessment: Within Functional Limits Lumbar Assessment Lumbar Assessment: Exceptions to [facility] Postural Control Postural  Control: Deficits on evaluation (delayed in standing)  Balance Balance Balance Assessed: Yes Static Sitting Balance Static Sitting - Balance Support: Feet supported Static Sitting - Level of Assistance: 5: Stand by assistance Dynamic Sitting Balance Dynamic Sitting - Balance Support: During functional activity Dynamic Sitting - Level of Assistance: 4: Min assist Static Standing Balance Static Standing - Balance Support: During functional activity Static Standing - Level of Assistance: 4: Min assist Dynamic Standing Balance Dynamic Standing - Balance Support: During functional activity Dynamic Standing - Level of Assistance: 4: Min assist;3: Mod assist Extremity Assessment  RUE  Assessment RUE Assessment: Exceptions to [facility] General Strength Comments: overall 4-/5 RUE Strength RUE Overall Strength: Deficits LUE Assessment LUE Assessment: Exceptions to [facility] General Strength Comments: overall 4/5 RLE Assessment RLE Assessment: Exceptions to [facility] General Strength Comments: grossly 4-/5 proximal to distal LLE Assessment LLE Assessment: Exceptions to [facility] General Strength Comments: grossly 4/5 proximal to distal  Care Tool Care Tool Bed Mobility Roll left and right activity   Roll left and right assist level: Minimal Assistance - Patient > 75%    Sit to lying activity   Sit to lying assist level: Minimal Assistance - Patient > 75%    Lying to sitting on side of bed activity   Lying to sitting on side of bed assist level: the ability to move from lying on the back to sitting on the side of the bed with no back support.: Minimal Assistance - Patient > 75%     Care Tool Transfers Sit to stand transfer   Sit to stand assist level: Minimal Assistance - Patient > 75%    Chair/bed transfer   Chair/bed transfer assist level: Minimal Assistance - Patient > 75%     Physiological scientist transfer assist level: Moderate Assistance - Patient 50 - 74%      Care Tool Locomotion Ambulation   Assist level: Moderate Assistance - Patient 50 - 74% Assistive device: Hand held assist Max distance: 40  Walk 10 feet activity   Assist level: Moderate Assistance - Patient - 50 - 74% Assistive device: Hand held assist   Walk 50 feet with 2 turns activity   Assist level: Maximal Assistance - Patient 25 - 49% Assistive device: Hand held assist  Walk 150 feet activity   Assist level: Total Assistance - Patient < 25% Assistive device: Hand held assist  Walk 10 feet on uneven surfaces activity   Assist level: Moderate Assistance - Patient - 50 - 74%    Stairs   Assist level: Moderate Assistance - Patient - 50 - 74% Stairs assistive device: 2 hand  rails    Walk up/down 1 step activity   Walk up/down 1 step (curb) assist level: Moderate Assistance - Patient - 50 - 74%    Walk up/down 4 steps activity     Walk up/down 4 steps assistive device: 2 hand rails  Walk up/down 12 steps activity   Walk up/down 12 steps assist level: Moderate Assistance - Patient - 50 - 74% Walk up/down 12 steps assistive device: 2 hand rails  Pick up small objects from floor   Pick up small object from the floor assist level: Moderate Assistance - Patient 50 - 74%    Wheelchair Is the patient using a wheelchair?: Yes Type of Wheelchair: Manual   Wheelchair assist level: Minimal Assistance - Patient > 75% Max wheelchair distance: 50  Wheel 50 feet with 2 turns  activity   Assist Level: Minimal Assistance - Patient > 75%  Wheel 150 feet activity   Assist Level: Moderate Assistance - Patient 50 - 74%    Refer to Care Plan for Long Term Goals  SHORT TERM GOAL WEEK 1 PT Short Term Goal 1 (Week 1): Pt will ambulate >135f with CGA PT Short Term Goal 2 (Week 1): Pt will improve 5xSTS by >5sec to demonstrate imrpoved safety safety and reduced fall risk with functional mobility PT Short Term Goal 3 (Week 1): Pt will propell WC >1043fwith supervision assist PT Short Term Goal 4 (Week 1): Pt will perform bed balance scale to measure fall risk in fucntional setting  Recommendations for other services: Therapeutic Recreation  Stress management  Skilled Therapeutic Intervention Mobility Bed Mobility Bed Mobility: Sit to Supine;Supine to Sit Supine to Sit: Minimal Assistance - Patient > 75% Sit to Supine: Minimal Assistance - Patient > 75% Transfers Transfers: Stand to Sit;Sit to Stand;Stand Pivot Transfers Sit to Stand: Minimal Assistance - Patient > 75% Stand to Sit: Minimal Assistance - Patient > 75% Stand Pivot Transfers: Minimal Assistance - Patient > 75% Locomotion  Gait Ambulation: Yes Gait Assistance: Minimal Assistance - Patient > 75%;Moderate  Assistance - Patient 50-74% Gait Distance (Feet): 120 Feet Assistive device: Rolling walker Gait Assistance Details: Verbal cues for sequencing;Verbal cues for technique;Verbal cues for precautions/safety;Verbal cues for gait pattern;Verbal cues for safe use of DME/AE;Manual facilitation for weight shifting Gait Assistance Details: mod assist with no AD for 4065fnd min assist with RW for safety and AD management. Gait Gait: Yes Gait Pattern: Impaired Gait Pattern: Decreased stride length;Ataxic;Festinating Stairs / Additional Locomotion Stairs: Yes Stairs Assistance: Minimal Assistance - Patient > 75% (unabel to perform without UE Support) Stair Management Technique: Two rails Number of Stairs: 12 Height of Stairs: 6 (and 3) Wheelchair Mobility Wheelchair Mobility: Yes Wheelchair Assistance: Minimal assistance - Patient >75% Wheelchair Propulsion: Both upper extremities Wheelchair Parts Management: Needs assistance Distance: 50   Pt received sitting in WC and agreeable to PT. PT instructed patient in PT Evaluation and initiated treatment intervention; see above for results. PT educated patient in POCLambertehab potential, rehab goals, and discharge recommendations along with recommendation for follow-up rehabilitation services. [facility] and mod assist with festination intermittently then HHA from PT for final [id number] gait training with RW x [id number] min assist for safety and noted improvement in step length and symmetry. Compared to no AD. Pt performed 5 time sit<>stand (5xSTS): 28 sec (>15 sec indicates increased fall risk) stair management training with BUE support and min-mod assist for adequate step length on descent. Noted to advance the LLE in descent despite cues for RLE advancement from PT. Car transfer performed with mod assist and no AD due to use of BUE support on door with decreased safety awareness. Pt returned to room and  performed sit<>stand to doff/don brief following in continent urination. Total A for clothing management per pt request. Stand pivot transfer to bed with min assist and RW for safety. Sit>supine completed with min assist for RLE control over EOB, and left supine in bed with call bell in reach and all needs met.        Discharge Criteria: Patient will be discharged from PT if patient refuses treatment 3 consecutive times without medical reason, if treatment goals not met, if there is a change in medical status, if patient makes no progress towards goals  or if patient is discharged from hospital.  The above assessment, treatment plan, treatment alternatives and goals were discussed and mutually agreed upon: by patient  Lorie Phenix 02/25/2022, 3:30 PM

## 2022-02-25 NOTE — Evaluation (Signed)
Speech Language Pathology Assessment and Plan  Patient Details  Name: William Acevedo MRN: [id number] Date of Birth: 02-28-1955  SLP Diagnosis:  (N/A)  Rehab Potential:  (N/A) ELOS: N/A for ST    Today's Date: 02/25/2022 SLP Individual Time: [id number] SLP Individual Time Calculation (min): 63 min   Hospital Problem: Principal Problem:   Acute ischemic multifocal multiple vascular territories stroke [facility]) Active Problems:   DM2 (diabetes mellitus, type 2) ([place])   CVA (cerebral vascular accident) ([place])   Schizoaffective disorder ([place])  Past Medical History:  Past Medical History:  Diagnosis Date   Carpal tunnel syndrome    Cocaine use    Depression    Diabetes mellitus without complication ([place])    Endocarditis    ETOH abuse    Hypertensive urgency 04/14/2020   Neuropathy    PTSD (post-traumatic stress disorder)    Stroke (cerebrum) ([place])    multiple   Zoster    Past Surgical History:  Past Surgical History:  Procedure Laterality Date   BUBBLE STUDY  03/30/2020   Procedure: BUBBLE STUDY;  Surgeon: Jerline Pain, MD;  Location: [facility];  Service: Cardiovascular;;   TEE WITHOUT CARDIOVERSION N/A 03/30/2020   Procedure: TRANSESOPHAGEAL ECHOCARDIOGRAM (TEE);  Surgeon: Jerline Pain, MD;  Location: [facility] ENDOSCOPY;  Service: Cardiovascular;  Laterality: N/A;   TESTICLE TORSION REDUCTION      Assessment / Plan / Recommendation Clinical Impression HPI: William Acevedo is a 67 year old RH-male with history of  CAD s/p NSTEMI, T2DM, ETOH/cocaine abuse with  neuropathy,endocarditis, cryptococcal PNA, CVA, schizoaffective disorder, PTSD, chronic GI issues;  who was admitted on 02/17/22 with reports of vomiting and decreased intake X 3 days progressing to increase lethargy and non-verbal state. He was found to have elevated BP.  UDS + cocaine. CT abdomen/pelvis negative for acute process. MRI brain showed acute to subacute infarct in medial left frontal lboe correlating to  L-ACA and ACA-PCA watershed infarct as well as remote infarcts involving bilateral occipital cortex, right parietal cortex and posterior left frontal lobe while matter. CTA head/neck showed left A2 segment occlusion and severe stenosis of proxima cavernous segment of L-ICA.  Dr. Curly Shores felt that stroke was embolic from L-ICA v/s cardioembolic v/s cocaine related vasculopathy. 2D echo showed EF 55-60% with moderate calcification of aortic valve without stenosis and mild asymmetric LVH.    Dr. Leonie Man recommended DAPT X 3 weeks followed by Plavix alone as patient was reportedly taking ASA PTA. Lethargy is resolving with improvement in participation. He continues to be limited by right sided weakness with sensory deficits, cognitive deficits with poor carryover, decrease in initiation with limited verbal output and fatigue. CIR recommended due to functional decline.     SLP consulted to complete cognitive-linguistic evaluation in the setting of acute CVA. Pt greeted lethargic and sitting EOB and consuming breakfast; flat affect overall however did become verbally agitated x 1. Agreeable to evaluation. Of note, pt is a poor historian and hx of severe cognitive deficits at baseline with full dependence on brother for iADL assistance.  Per informal and portions of formal assessment measures, pt presents with severe cognitive-linguistic impairment, as evident by inability to complete today's attempt at administering [place] assessment due to ongoing verbal perseveration's and echolalia (scored 2/14 points), and achieving a score of 7/30 on previous SLUMS assessment completed in 2022. Per chart review, pt unable to cooperate with ST assessment in acute care and did not appear stimulable for skilled intervention at that time.  Within a simple and functional context, pt's receptive and expressive language appeared to be grossly intact for basic information, though decreased command following noted when given  multi-step directives. Suspect this was likely due to fatigue, decreased working memory, and delayed processing. Pt noted to exhibit intermittent dysfluencies when answering simple questions. Nevertheless, this did not interfere with pt's ability to verbalize basic and immediate wants and needs with speech noted to be intelligible. Initiation does appear impaired; therefore, pt did benefit from close-ended and yes/no questions to engage in simple communication acts. Prior to admission, pt was fully dependent on his brother for all cognitive related tasks due to extensive psych and substance abuse hx. No oropharyngeal deficits noted within the context of AM meal.   Given pt's baseline cognitive dependence + deficits and minimally stimulability for skilled ST intervention, formal ST intervention does not appear indicated at this time. If situation/circumstances change, please re-consult. Spoke with brother on the phone who reports he took provided total assistance to his brother for all iADL aspects. Pt will continue to require 24/7 supervision and assistance at time of d/c. If this is not available, may consider SNF placement. Thank you for this consult.    1 Portions of [facility] SLUMS and informal cognitive-linguistic assessment completed. Please see full report for details.   SLP Assessment  Patient does not need any further Speech Lanaguage Pathology Services    Recommendations  SLP Diet Recommendations: Age appropriate regular solids;Thin (per MD order - no s/sx concerning for airway compromised noted with consumption of AM meal) Liquid Administration via: Cup;Straw Medication Administration: Whole meds with liquid Supervision: Patient able to self feed (set-up assistance) Compensations: Minimize environmental distractions;Slow rate;Small sips/bites Postural Changes and/or Swallow Maneuvers: Seated upright 90 degrees;Upright 30-60 min after meal Oral Care Recommendations: Oral care BID Recommendations  for Other Services: Neuropsych consult Patient destination: Home Follow up Recommendations: 24 hour supervision/assistance Equipment Recommended: None recommended by SLP    SLP Frequency  (N/A)   SLP Duration  SLP Intensity  SLP Treatment/Interventions N/A for ST   (N/A)   (N/A)    Pain Pain Assessment Pain Scale: 0-10 Pain Score: 0-No pain  Prior Functioning Cognitive/Linguistic Baseline: Baseline deficits Baseline deficit details: 7/30 on SLUMS administered in 2022 Type of Home: House  Lives With: Family (brother) Available Help at Discharge: Family;Available 24 hours/day (Pt's brother reports he takes complete and total care of pt) Education: 12th grade - HS diploma Vocation: Retired  Programmer, systems Overall Cognitive Status: Difficult to assess Arousal/Alertness: Awake/alert Orientation Level: Oriented to person;Disoriented to situation;Oriented to place;Disoriented to time Year: Other (Comment) (multiple incorrect years provided with pt stating "you know what I mean.") Month: February Day of Week: Incorrect (4th) Attention: Sustained;Focused Focused Attention: Impaired Focused Attention Impairment: Verbal basic;Functional basic Sustained Attention: Impaired Sustained Attention Impairment: Verbal basic;Functional basic Memory: Impaired Memory Impairment: Retrieval deficit;Decreased recall of new information;Decreased short term memory;Storage deficit Decreased Short Term Memory: Verbal basic;Functional basic Awareness: Impaired Awareness Impairment: Intellectual impairment Problem Solving: Impaired Problem Solving Impairment: Verbal basic;Functional basic Executive Function:  (Impaired) Behaviors: Perseveration (verbal) Safety/Judgment: Impaired  Comprehension Auditory Comprehension Overall Auditory Comprehension: Other (comment) Yes/No Questions: Within Functional Limits Basic Biographical Questions: 76-100% accurate (100%) Basic Immediate  Environment Questions: 75-100% accurate (100%) Complex Questions: 75-100% accurate (80%) Commands: Within Functional Limits (Decreased command following with multiple step commands likely due to decreased motivation and cognitive deficits vs auditory comprehension impairment) One Step Basic Commands: 75-100% accurate Conversation: Simple Interfering Components: Attention;Processing speed;Working Marine scientist  EffectiveTechniques: Extra processing time;Repetition Visual Recognition/Discrimination Discrimination: Not tested Reading Comprehension Reading Status: Not tested Expression Expression Primary Mode of Expression: Verbal Verbal Expression Overall Verbal Expression: Impaired Initiation: Impaired Repetition: No impairment (12/12 on COGNISTAT verbal repetition subtest) Naming: No impairment (No impairment noted on basic object naming - 100% accuracy) Responsive: 76-100% accurate (80% accuracy) Confrontation: Within functional limits Convergent: Not tested Divergent: Not tested Verbal Errors: Perseveration Pragmatics: Impairment Impairments: Abnormal affect;Dysprosody;Eye contact;Monotone Interfering Components: Attention Non-Verbal Means of Communication: Not applicable Written Expression Written Expression: Not tested Oral Motor Oral Motor/Sensory Function Overall Oral Motor/Sensory Function: Within functional limits Motor Speech Overall Motor Speech: Other (comment) (Intermittent dysfluencies noted likely due to medication side effects (antipyschotics) Respiration: Within functional limits Phonation: Normal Resonance: Within functional limits Intelligibility: Intelligible Motor Speech Errors: Not applicable Interfering Components:  (Missing dentition)  Care Tool Care Tool Cognition Ability to hear (with hearing aid or hearing appliances if normally used Ability to hear (with hearing aid or hearing appliances if normally used): 0. Adequate - no difficulty in normal  conservation, social interaction, listening to TV   Expression of Ideas and Wants Expression of Ideas and Wants: 3. Some difficulty - exhibits some difficulty with expressing needs and ideas (e.g, some words or finishing thoughts) or speech is not clear   Understanding Verbal and Non-Verbal Content Understanding Verbal and Non-Verbal Content: 3. Usually understands - understands most conversations, but misses some part/intent of message. Requires cues at times to understand  Memory/Recall Ability Memory/Recall Ability : That he or she is in a hospital/hospital unit   PMSV Assessment  PMSV Trial Intelligibility: Intelligible  Bedside Swallowing Assessment Regular diet textures with thin liquids, per MD order. No overt s/sx concerning for airway intrusion/compromise noted at bedside with skilled observation of AM meal which pt consumed 100% of.  Short Term Goals: N/A  Refer to Care Plan for Long Term Goals  Recommendations for other services: Neuropsych  Discharge Criteria: Patient will be discharged from SLP if patient refuses treatment 3 consecutive times without medical reason, if treatment goals not met, if there is a change in medical status, if patient makes no progress towards goals or if patient is discharged from hospital.  The above assessment, treatment plan, treatment alternatives and goals were discussed and mutually agreed upon: by patient and by family  Romelle Starcher A Klein Willcox 02/25/2022, 3:52 PM

## 2022-02-25 NOTE — Discharge Instructions (Addendum)
Inpatient Rehab Discharge Instructions  William Acevedo Discharge date and time:  03/04/22  Activities/Precautions/ Functional Status: Activity: no lifting, driving, or strenuous exercise till cleared by MD Diet: cardiac diet and diabetic diet Wound Care: none needed   Functional status:  ___ No restrictions     ___ Walk up steps independently __X_ 24/7 supervision/assistance   ___ Walk up steps with assistance ___ Intermittent supervision/assistance  ___ Bathe/dress independently ___ Walk with walker     ___ Bathe/dress with assistance ___ Walk Independently    ___ Shower independently ___ Walk with assistance    _X__ Shower with assistance _X__ No alcohol/Cocaine    ___ Return to work/school ________   Special Instructions: Family needs to manage medications and help administer it.  Needs 24 hours supervision.     COMMUNITY REFERRALS UPON DISCHARGE:    Home Health:   PT  OT  RN                  Agency:[facility]     Phone:[phone]    Medical Equipment/Items Ordered:HAS EQUIPMENT FROM PREVIOUS ADMISSIONS                                                 Agency/Supplier:NA    STROKE/TIA DISCHARGE INSTRUCTIONS SMOKING Cigarette smoking nearly doubles your risk of having a stroke & is the single most alterable risk factor  If you smoke or have smoked in the last 12 months, you are advised to quit smoking for your health. Most of the excess cardiovascular risk related to smoking disappears within a year of stopping. Ask you doctor about anti-smoking medications [place] Quit Line: 1-800-QUIT NOW Free Smoking Cessation Classes [phone]  CHOLESTEROL Know your levels; limit fat & cholesterol in your diet  Lipid Panel     Component Value Date/Time   CHOL 291 (H) 02/18/2022 0652   TRIG 88 02/18/2022 0652   HDL 56 02/18/2022 0652   CHOLHDL 5.2 02/18/2022 0652   VLDL 18 02/18/2022 0652   LDLCALC 217 (H) 02/18/2022 0652     Many patients benefit from  treatment even if their cholesterol is at goal. Goal: Total Cholesterol (CHOL) less than 160 Goal:  Triglycerides (TRIG) less than 150 Goal:  HDL greater than 40 Goal:  LDL (LDLCALC) less than 100   BLOOD PRESSURE American Stroke Association blood pressure target is less that 120/80 mm/Hg  Your discharge blood pressure is:  BP: 102/66 Monitor your blood pressure Limit your salt and alcohol intake Many individuals will require more than one medication for high blood pressure  DIABETES (A1c is a blood sugar average for last 3 months) Goal HGBA1c is under 7% (HBGA1c is blood sugar average for last 3 months)  Diabetes:     Lab Results  Component Value Date   HGBA1C 10.6 (H) 01/23/2022    Your HGBA1c can be lowered with medications, healthy diet, and exercise. Check your blood sugar as directed by your physician Call your physician if you experience unexplained or low blood sugars.  PHYSICAL ACTIVITY/REHABILITATION Goal is 30 minutes at least 4 days per week  Activity: No driving, Therapies: see above Return to work: N/A Activity decreases your risk of heart attack and stroke and makes your heart stronger.  It helps control your weight and blood pressure; helps you relax and can improve your  mood. Participate in a regular exercise program. Talk with your doctor about the best form of exercise for you (dancing, walking, swimming, cycling).  DIET/WEIGHT Goal is to maintain a healthy weight  Your discharge diet is:  Diet Order             Diet Carb Modified Fluid consistency: Thin; Room service appropriate? Yes  Diet effective now                   liquids Your height is:  5'6" Your current weight is: Weight: 48.5 kg Your Body Mass Index (BMI) is:  BMI (Calculated): 17.27 Following the type of diet specifically designed for you will help prevent another stroke. You are below goal weight.  Your goal Body Mass Index (BMI) is 19-24. Healthy food habits can help reduce 3 risk factors  for stroke:  High cholesterol, hypertension, and excess weight.  RESOURCES Stroke/Support Group:  Call [phone]   STROKE EDUCATION PROVIDED/REVIEWED AND GIVEN TO PATIENT Stroke warning signs and symptoms How to activate emergency medical system (call 911). Medications prescribed at discharge. Need for follow-up after discharge. Personal risk factors for stroke. Pneumonia vaccine given:  Flu vaccine given:  My questions have been answered, the writing is legible, and I understand these instructions.  I will adhere to these goals & educational materials that have been provided to me after my discharge from the hospital.     My questions have been answered and I understand these instructions. I will adhere to these goals and the provided educational materials after my discharge from the hospital.  Patient/Caregiver Signature _______________________________ Date __________  Clinician Signature _______________________________________ Date __________  Please bring this form and your medication list with you to all your follow-up doctor's appointments.
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
## 2022-02-25 NOTE — Progress Notes (Signed)
During interactions with pt demonstrates moments of rigidity with upper extremities and posturing observed. Arms would have delay in coming down to his side or would have to be placed towards the sides of his body. No cogwheeling assessed while moving arms or abnormal flexion or abnormal extension observed.

## 2022-02-25 NOTE — Progress Notes (Signed)
Patient ID: William Acevedo, Male DOB: 1955/02/21, 67 y.o. MRN: [id number]  Pt arrived to 4W24 per bed from 3W. Vitals obtained. Oriented to rehab unit and policies reviewed. Pt in agreement. Call light within reach, bed alarm on, and bed in lowest position. Needs identified.

## 2022-02-25 NOTE — Progress Notes (Signed)
Inpatient Rehabilitation Admission Medication Review by a Pharmacist  A complete drug regimen review was completed for this patient to identify any potential clinically significant medication issues.  High Risk Drug Classes Is patient taking? Indication by Medication  Antipsychotic Yes Prochlorperazine for nausea Olanzapine for schizoaffective disorder  Anticoagulant Yes Enoxaparin for DVT prophylaxis  Antibiotic No   Opioid No   Antiplatelet Yes Aspirin, clopidogrel for stroke and CAD   Hypoglycemics/insulin Yes Insulin aspart and insulin glargine for diabetes   Vasoactive Medication Yes Metoprolol for hypertension  Chemotherapy No   Other Yes Acetaminophen for mild pain Atorvastatin for hyperlipidemia Dapagliflozin for HFpEF and diabetes  Duloxetine for mood, PTSD, and nerve pain  Gabapentin for nerve pain Famotidine and pantoprazole for GERD Metformin for diabetes  Trazodone and melatonin for sleep Diphenhydramine for itching     Type of Medication Issue Identified Description of Issue Recommendation(s)  Drug Interaction(s) (clinically significant)     Duplicate Therapy  On Famotidine and pantoprazole for chronic GI issues/GERD. Patient is not on either regularly at home  Monitor need for 2 acid suppressants   Allergy     No Medication Administration End Date  Aspirin to continue for 3 weeks per neurology End date 03/09/22 added   Incorrect Dose     Additional Drug Therapy Needed     Significant med changes from prior encounter (inform family/care partners about these prior to discharge). Home medications unknown  Review med changes at discharge  Other  Patient with active cocaine use and is on metoprolol, which can cause profound hypotension Consider stopping metoprolol and starting low dose carvedilol given CAD history. MD agreed     Clinically significant medication issues were identified that warrant physician communication and completion of prescribed/recommended  actions by midnight of the next day:  Yes and resolved   Name of provider notified for urgent issues identified: Dr. Ranell Patrick  Provider Method of Notification: Secure message    Pharmacist comments: Clinically significant medication issue was resolved per above. Orders entered.   Time spent performing this drug regimen review (minutes):  [place], PharmD, [place], [facility] Clinical Pharmacist  Please check AMION for all [place] phone numbers After 10:00 PM, call [place] [phone]

## 2022-02-25 NOTE — Plan of Care (Signed)
  Problem: RH Balance Goal: LTG Patient will maintain dynamic sitting balance (PT) Description: LTG:  Patient will maintain dynamic sitting balance with assistance during mobility activities (PT) Flowsheets (Taken 02/25/2022 1631) LTG: Pt will maintain dynamic sitting balance during mobility activities with:: Independent Goal: LTG Patient will maintain dynamic standing balance (PT) Description: LTG:  Patient will maintain dynamic standing balance with assistance during mobility activities (PT) Flowsheets (Taken 02/25/2022 1631) LTG: Pt will maintain dynamic standing balance during mobility activities with:: Supervision/Verbal cueing   Problem: RH Bed Mobility Goal: LTG Patient will perform bed mobility with assist (PT) Description: LTG: Patient will perform bed mobility with assistance, with/without cues (PT). Flowsheets (Taken 02/25/2022 1631) LTG: Pt will perform bed mobility with assistance level of: Supervision/Verbal cueing   Problem: RH Bed to Chair Transfers Goal: LTG Patient will perform bed/chair transfers w/assist (PT) Description: LTG: Patient will perform bed to chair transfers with assistance (PT). Flowsheets (Taken 02/25/2022 1631) LTG: Pt will perform Bed to Chair Transfers with assistance level: Supervision/Verbal cueing   Problem: RH Car Transfers Goal: LTG Patient will perform car transfers with assist (PT) Description: LTG: Patient will perform car transfers with assistance (PT). Flowsheets (Taken 02/25/2022 1631) LTG: Pt will perform car transfers with assist:: Supervision/Verbal cueing   Problem: RH Furniture Transfers Goal: LTG Patient will perform furniture transfers w/assist (OT/PT) Description: LTG: Patient will perform furniture transfers  with assistance (OT/PT). Flowsheets (Taken 02/25/2022 1631) LTG: Pt will perform furniture transfers with assist:: Supervision/Verbal cueing   Problem: RH Ambulation Goal: LTG Patient will ambulate in controlled environment  (PT) Description: LTG: Patient will ambulate in a controlled environment, # of feet with assistance (PT). Flowsheets (Taken 02/25/2022 1631) LTG: Pt will ambulate in controlled environ  assist needed:: Supervision/Verbal cueing LTG: Ambulation distance in controlled environment: 157ft with LRAD Goal: LTG Patient will ambulate in home environment (PT) Description: LTG: Patient will ambulate in home environment, # of feet with assistance (PT). Flowsheets (Taken 02/25/2022 1631) LTG: Pt will ambulate in home environ  assist needed:: Supervision/Verbal cueing LTG: Ambulation distance in home environment: 17ft with LRAD   Problem: RH Wheelchair Mobility Goal: LTG Patient will propel w/c in controlled environment (PT) Description: LTG: Patient will propel wheelchair in controlled environment, # of feet with assist (PT) Flowsheets (Taken 02/25/2022 1631) LTG: Pt will propel w/c in controlled environ  assist needed:: Supervision/Verbal cueing LTG: Propel w/c distance in controlled environment: 127ft

## 2022-02-25 NOTE — Care Management (Signed)
Inpatient Rehabilitation Center Individual Statement of Services  Patient Name:  William Acevedo  Date:  02/25/2022  Welcome to the [facility].  Our goal is to provide you with an individualized program based on your diagnosis and situation, designed to meet your specific needs.  With this comprehensive rehabilitation program, you will be expected to participate in at least 3 hours of rehabilitation therapies Monday-Friday, with modified therapy programming on the weekends.  Your rehabilitation program will include the following services:  Physical Therapy (PT), Occupational Therapy (OT), Speech Therapy (ST), 24 hour per day rehabilitation nursing, Therapeutic Recreaction (TR), Psychology, Neuropsychology, Care Coordinator, Rehabilitation Medicine, [place], and Other  Weekly team conferences will be held on Wednesdays to discuss your progress.  Your Inpatient Rehabilitation Care Coordinator will talk with you frequently to get your input and to update you on team discussions.  Team conferences with you and your family in attendance may also be held.  Expected length of stay: 10-12 days   Overall anticipated outcome: Supervision  Depending on your progress and recovery, your program may change. Your Inpatient Rehabilitation Care Coordinator will coordinate services and will keep you informed of any changes. Your Inpatient Rehabilitation Care Coordinator's name and contact numbers are listed  below.  The following services may also be recommended but are not provided by the [facility] will be made to provide these services after discharge if needed.  Arrangements include referral to agencies that provide these services.  Your insurance has been verified to be:  [employer]  Your primary doctor  is:  [facility]  Pertinent information will be shared with your doctor and your insurance company.  Inpatient Rehabilitation Care Coordinator:  Ovidio Kin, [place] or Emilia Beck  Information discussed with and copy given to patient by: Rana Snare, 02/25/2022, 4:58 PM

## 2022-02-25 NOTE — Progress Notes (Signed)
Inpatient Rehabilitation  Patient information reviewed and entered into eRehab system by Boleslaus Holloway Ajeenah Heiny, OTR/L, Rehab Quality Coordinator.   Information including medical coding, functional ability and quality indicators will be reviewed and updated through discharge.   

## 2022-02-25 NOTE — Progress Notes (Signed)
Occupational Therapy Assessment and Plan  Patient Details  Name: William Acevedo MRN: [id number] Date of Birth: 1955/06/27  OT Diagnosis: ataxia, hemiplegia affecting dominant side, and muscle weakness (generalized) Rehab Potential: Rehab Potential (ACUTE ONLY): Good ELOS: 10-12 days   Today's Date: 02/25/2022 OT Individual Time: 1050-1200 OT Individual Time Calculation (min): 70 min     Hospital Problem: Principal Problem:   Acute ischemic multifocal multiple vascular territories stroke [facility]) Active Problems:   DM2 (diabetes mellitus, type 2) ([place])   CVA (cerebral vascular accident) ([place])   Schizoaffective disorder ([place])   Past Medical History:  Past Medical History:  Diagnosis Date   Carpal tunnel syndrome    Cocaine use    Depression    Diabetes mellitus without complication ([place])    Endocarditis    ETOH abuse    Hypertensive urgency 04/14/2020   Neuropathy    PTSD (post-traumatic stress disorder)    Stroke (cerebrum) ([place])    multiple   Zoster    Past Surgical History:  Past Surgical History:  Procedure Laterality Date   BUBBLE STUDY  03/30/2020   Procedure: BUBBLE STUDY;  Surgeon: Jerline Pain, MD;  Location: [facility];  Service: Cardiovascular;;   TEE WITHOUT CARDIOVERSION N/A 03/30/2020   Procedure: TRANSESOPHAGEAL ECHOCARDIOGRAM (TEE);  Surgeon: Jerline Pain, MD;  [facility] ENDOSCOPY;  Service: Cardiovascular;  Laterality: N/A;   TESTICLE TORSION REDUCTION      Assessment & Plan Clinical Impression: Patient is a 67 y.o. year old male with history of  CAD s/p NSTEMI, T2DM, ETOH/cocaine abuse with  neuropathy,endocarditis, cryptococcal PNA, CVA, schizoaffective disorder, PTSD, chronic GI issues;  who was admitted on 02/17/22 with reports of vomiting and decreased intake X 3 days progressing to increase lethargy and non-verbal state. He was found to have elevated BP.  UDS + cocaine. CT abdomen/pelvis negative for acute process. MRI brain showed acute to  subacute infarct in medial left frontal lboe correlating to L-ACA and ACA-PCA watershed infarct as well as remote infarcts involving bilateral occipital cortex, right parietal cortex and posterior left frontal lobe while matter. CTA head/neck showed left A2 segment occlusion and severe stenosis of proxima cavernous segment of L-ICA.  Dr. Curly [place] felt that stroke was embolic from L-ICA v/s cardioembolic v/s cocaine related vasculopathy. 2D echo showed EF 55-60% with moderate calcification of aortic valve without stenosis and mild asymmetri Patient transferred to CIR on 02/24/2022 .  Patient currently requires min with basic self-care skills secondary to muscle weakness, decreased cardiorespiratoy endurance, impaired timing and sequencing, abnormal tone, unbalanced muscle activation, ataxia, decreased coordination, and decreased motor planning, decreased attention to right and right side neglect, decreased initiation, decreased attention, decreased awareness, decreased problem solving, decreased safety awareness, decreased memory, and delayed processing, and decreased sitting balance, decreased standing balance, decreased postural control, hemiplegia, decreased balance strategies.  Prior to hospitalization, patient could complete BADL with min.  Patient will benefit from skilled intervention to increase independence with basic self-care skills prior to discharge home with care partner.  Anticipate patient will require 24 hour supervision and follow up home health.  OT - End of Session Endurance Deficit: Yes Endurance Deficit Description: rest breaks within BADL tasks OT Assessment Rehab Potential (ACUTE ONLY): Good OT Patient demonstrates impairments in the following area(s): Balance;Cognition;Behavior;Endurance;Motor;Perception;Safety;Vision OT Basic ADL's Functional Problem(s): Eating;Grooming;Bathing;Dressing;Toileting OT Transfers Functional Problem(s): Tub/Shower;Toilet OT Additional Impairment(s):  Fuctional Use of Upper Extremity OT Plan OT Intensity: Minimum of 1-2 x/day, 45 to 90 minutes OT Frequency: 5 out of  7 days OT Duration/Estimated Length of Stay: 10-12 days OT Treatment/Interventions: Balance/vestibular training;Cognitive remediation/compensation;Community reintegration;Discharge planning;Disease mangement/prevention;DME/adaptive equipment instruction;Functional electrical stimulation;Functional mobility training;Neuromuscular re-education;Pain management;Patient/family education;Self Care/advanced ADL retraining;Skin care/wound managment;Splinting/orthotics;Therapeutic Activities;Psychosocial support;Therapeutic Exercise;UE/LE Strength taining/ROM;UE/LE Coordination activities;Visual/perceptual remediation/compensation;Wheelchair propulsion/positioning OT Self Feeding Anticipated Outcome(s): Set-up OT Basic Self-Care Anticipated Outcome(s): Supervision OT Toileting Anticipated Outcome(s): Supervision OT Bathroom Transfers Anticipated Outcome(s): SUpervision OT Recommendation Recommendations for Other Services: Neuropsych consult Patient destination: Home Follow Up Recommendations: 24 hour supervision/assistance;Outpatient OT Equipment Recommended: To be determined Equipment Details: TBD   OT Evaluation Precautions/Restrictions  Precautions Precautions: Fall Restrictions Weight Bearing Restrictions: No Pain Pain Assessment Pain Scale: 0-10 Pain Score: 3  Home Living/Prior [place] expects to be discharged to:: Private residence Living Arrangements: Alone Available Help at Discharge: Family, Available PRN/intermittently (Per pt, brother works during the day? Per chart review, pt's brother available to provide 24/7 supervision and assistance post-dc.) Type of Home: House Bathroom Shower/Tub: Tub/shower unit Additional Comments: Need to clarify home information  Lives With: Family (brother) IADL History Education: 12th grade - HS  diploma Prior Function Level of Independence: Needs assistance with ADLs, Needs assistance with homemaking (per patient report. Will need to clarify with family) Vocation: On disability Vision Patient Visual Report: Blurring of vision Vision Assessment?: Vision impaired- to be further tested in functional context Cognition Cognition Overall Cognitive Status: No family/caregiver present to determine baseline cognitive functioning Arousal/Alertness: Awake/alert Orientation Level: Person;Place;Situation Person: Oriented Place: Oriented Situation: Oriented Memory: Impaired Memory Impairment: Retrieval deficit;Decreased recall of new information;Decreased short term memory;Storage deficit (4/5 immediate recall; delayed recall with distraction -) Decreased Short Term Memory: Verbal basic;Functional basic Attention: Sustained;Focused Focused Attention: Impaired Focused Attention Impairment: Verbal basic;Functional basic Sustained Attention: Impaired Sustained Attention Impairment: Verbal basic;Functional basic Awareness: Impaired Awareness Impairment: Intellectual impairment Problem Solving: Impaired Problem Solving Impairment: Verbal basic Safety/Judgment: Impaired Brief Interview for Mental Status (BIMS) Repetition of Three Words (First Attempt): 3 Temporal Orientation: Year: Correct Temporal Orientation: Month: Accurate within 5 days Temporal Orientation: Day: Incorrect Recall: "Sock": Yes, no cue required Recall: "Blue": Yes, no cue required Recall: "Bed": Yes, no cue required BIMS Summary Score: 14 Sensation Coordination Gross Motor Movements are Fluid and Coordinated: No Fine Motor Movements are Fluid and Coordinated: No Coordination and Movement Description: decreased smoothness and accuracy Finger Nose Finger Test: dysmetria Motor  Motor Motor: Hemiplegia;Ataxia  Trunk/Postural Assessment     Balance Balance Balance Assessed: Yes Static Sitting Balance Static  Sitting - Balance Support: Feet supported Static Sitting - Level of Assistance: 5: Stand by assistance Dynamic Sitting Balance Dynamic Sitting - Balance Support: During functional activity Dynamic Sitting - Level of Assistance: 4: Min assist Static Standing Balance Static Standing - Balance Support: During functional activity Static Standing - Level of Assistance: 4: Min assist Dynamic Standing Balance Dynamic Standing - Balance Support: During functional activity Dynamic Standing - Level of Assistance: 4: Min assist Extremity/Trunk Assessment RUE Assessment RUE Assessment: Exceptions to [facility] General Strength Comments: overall 4-/5 RUE Strength RUE Overall Strength: Deficits LUE Assessment LUE Assessment: Exceptions to [facility] General Strength Comments: overall 4/5  Care Tool Care Tool Self Care Eating   Eating Assist Level: Supervision/Verbal cueing    Oral Care    Oral Care Assist Level: Minimal Assistance - Patient > 75%    Bathing   Body parts bathed by patient: Right arm;Left arm;Abdomen;Chest;Buttocks;Right upper leg;Front perineal area;Left upper leg;Face Body parts bathed by helper: Right lower leg;Left lower leg   Assist Level: Minimal Assistance - Patient >  75%    Upper Body Dressing(including orthotics)   What is the patient wearing?: Pull over shirt   Assist Level: Minimal Assistance - Patient > 75%    Lower Body Dressing (excluding footwear)   What is the patient wearing?: Pants;Incontinence brief Assist for lower body dressing: Moderate Assistance - Patient 50 - 74%    Putting on/Taking off footwear   What is the patient wearing?: Non-skid slipper socks Assist for footwear: Moderate Assistance - Patient 50 - 74%       Care Tool Toileting Toileting activity   Assist for toileting: Moderate Assistance - Patient 50 - 74%     Care Tool Bed Mobility Roll left and right activity        Sit to lying activity        Lying to sitting on side of bed  activity   Lying to sitting on side of bed assist level: the ability to move from lying on the back to sitting on the side of the bed with no back support.: Moderate Assistance - Patient 50 - 74%     Care Tool Transfers Sit to stand transfer   Sit to stand assist level: Minimal Assistance - Patient > 75%    Chair/bed transfer   Chair/bed transfer assist level: Minimal Assistance - Patient > 75%     Toilet transfer         Care Tool Cognition  Expression of Ideas and Wants Expression of Ideas and Wants: 3. Some difficulty - exhibits some difficulty with expressing needs and ideas (e.g, some words or finishing thoughts) or speech is not clear  Understanding Verbal and Non-Verbal Content Understanding Verbal and Non-Verbal Content: 3. Usually understands - understands most conversations, but misses some part/intent of message. Requires cues at times to understand   Memory/Recall Ability Memory/Recall Ability : That he or she is in a hospital/hospital unit;Location of own room   Refer to Care Plan for Long Term Goals  SHORT TERM GOAL WEEK 1 OT Short Term Goal 1 (Week 1): Patient will integrate R UE into bathing tasks without cues OT Short Term Goal 2 (Week 1): Patient will tolerate standing for 5 minutes in preparation for BADL task OT Short Term Goal 3 (Week 1): Patient will complete 2 steps of toileting tasks.  Recommendations for other services: Neuropsych   Skilled Therapeutic Intervention ADL ADL Eating: Minimal assistance Grooming: Minimal assistance Upper Body Bathing: Minimal assistance Lower Body Bathing: Moderate assistance Upper Body Dressing: Minimal assistance Lower Body Dressing: Moderate assistance Toileting: Minimal assistance Toilet Transfer: Minimal assistance Mobility  Bed Mobility Bed Mobility: Sit to Supine;Supine to Sit Supine to Sit: Minimal Assistance - Patient > 75% Sit to Supine: Minimal Assistance - Patient > 75% Transfers Sit to Stand: Minimal  Assistance - Patient > 75% Stand to Sit: Minimal Assistance - Patient > 75%   Discharge Criteria: Patient will be discharged from OT if patient refuses treatment 3 consecutive times without medical reason, if treatment goals not met, if there is a change in medical status, if patient makes no progress towards goals or if patient is discharged from hospital.  The above assessment, treatment plan, treatment alternatives and goals were discussed and mutually agreed upon: by patient  Valma Cava 02/25/2022, 12:30 PM

## 2022-02-25 NOTE — Progress Notes (Signed)
PROGRESS NOTE   Subjective/Complaints: Appears fatigued and with flat affect, but follows commands well, denies any complaints, appreciate pharmacy changing metoprolol to coreg given history of cocaine use  ROS: denies pain   Objective:   No results found. Recent Labs    02/25/22 0528  WBC 6.1  HGB 10.8*  HCT 32.3*  PLT 199   Recent Labs    02/25/22 0528  NA 136  K 3.7  CL 99  CO2 29  GLUCOSE 91  BUN 30*  CREATININE 0.99  CALCIUM 8.7*    Intake/Output Summary (Last 24 hours) at 02/25/2022 1053 Last data filed at 02/25/2022 0900 Gross per 24 hour  Intake 300 ml  Output 1000 ml  Net -700 ml        Physical Exam: Vital Signs Blood pressure (!) 140/72, pulse 68, temperature (!) 97.5 F (36.4 C), temperature source Oral, resp. rate 14, weight 48.5 kg, SpO2 100 %. Gen: no distress, normal appearing HEENT: oral mucosa pink and moist, NCAT Cardio: Reg rate Chest: normal effort, normal rate of breathing Abd: soft, non-distended Ext: no edema Psych: pleasant, normal affect Skin: intact Genitourinary:    Comments: Condom catheter with medium amber urine Musculoskeletal:     Cervical back: Neck supple. No tenderness.     Comments: RUE 4+/5 in biceps, triceps, WE< grip and FA 4/5 LUE 5/5 in same muscles RLE- HF 4-/5; otherwise KE/DF and PF 4+/5 LLE 5/5 in same muscles   Skin:    General: Skin is warm and dry.     Comments: IV L forearm- looks OK  Neurological:     Mental Status: He is alert.     Sensory: Sensory deficit present.     Coordination: Coordination abnormal.     Comments: Oriented to self, place as hospital (unable to state name) or situation. Polite with limited verbal output. He was able to follow simple motor commands.    Aphasic; at word level- some word substitutions Decreased fine motor movement on RUE/hand Decreased to light touch in LUE; otherwise normal/intact in RUE and B/L LE's   Psychiatric:     Comments: Extremely flat affect, decreased interaction above and beyond the aphasia     Assessment/Plan: 1. Functional deficits which require 3+ hours per day of interdisciplinary therapy in a comprehensive inpatient rehab setting. Physiatrist is providing close team supervision and 24 hour management of active medical problems listed below. Physiatrist and rehab team continue to assess barriers to discharge/monitor patient progress toward functional and medical goals  Care Tool:  Bathing              Bathing assist       Upper Body Dressing/Undressing Upper body dressing        Upper body assist      Lower Body Dressing/Undressing Lower body dressing            Lower body assist       Toileting Toileting    Toileting assist       Transfers Chair/bed transfer  Transfers assist           Locomotion Ambulation   Ambulation assist  Walk 10 feet activity   Assist           Walk 50 feet activity   Assist           Walk 150 feet activity   Assist           Walk 10 feet on uneven surface  activity   Assist           Wheelchair     Assist               Wheelchair 50 feet with 2 turns activity    Assist            Wheelchair 150 feet activity     Assist          Blood pressure (!) 140/72, pulse 68, temperature (!) 97.5 F (36.4 C), temperature source Oral, resp. rate 14, weight 48.5 kg, SpO2 100 %.  Medical Problem List and Plan: 1. Functional deficits secondary to New L ACA/PCA watershed strokes with R hemiparesis and aphasia             -patient may  shower             -ELOS/Goals: 12-16 days supervision to min A PT and OT; and min-mod A for SLP 2.  Antithrombotics: -DVT/anticoagulation:  Pharmaceutical: Lovenox             -antiplatelet therapy: DAPT X 3 weeks followed by Plavix alone.  3. Pain Management: Tylenol prn.  4. Mood/Behavior/Sleep:  LCSW to follow for evaluation and suppor.t              -antipsychotic agents:  Zyprexa/hs 5. Neuropsych/cognition: This patient is not capable of making decisions on his own behalf. 6. Skin/Wound Care: Routine pressure relief measures.  7. Fluids/Electrolytes/Nutrition: Monitor I/O. Nephro TID for nutritional supplement.  --Check CMET in am.  8. Hypotension/HTN: Monitor BP TID--IVF d/c yesterday --will set hold parameters for Toprol XL 9. T2DM: Hgb A1c-10.6 and poorly controlled. Was on insulin at home per chart --continue insulin glargine 15 units BID (increased to bid on 02/08) --on Farxiga--continue to hold glucotrol Add metformin 513m daily. D/c Nepro             --Monitor BS ac/hs and use SSI for elevated BS.  10. Neuropathy: On Cymbalta and Neurontin BID.  11. Chronic abdominal pain/GERD: Continue Pepcid and Protonix.  12. Schizoaffective disorder and PTSD: Continue Zyprex, trazodone and cymbalta.  13. Elevated lipids: Chol-291 w/LDL-217. Continue Lipitor.  13. H/o ETOH/cocaine/polysubstance use: was (+) for cocaine and Marijuana and EtOH-Has completed CIWA protocol. Metoprolol changed to coreg due to risks of hypotension with history of cocaine use 14. Underweight: encourage high protein diet.  15. Screening for vitamin D deficiency: f/u vitamin D level  LOS: 1 days A FACE TO FACE EVALUATION WAS PERFORMED  KClide DeutscherRaulkar 02/25/2022, 10:53 AM

## 2022-02-25 NOTE — Plan of Care (Signed)
Problem: RH Balance Goal: LTG: Patient will maintain dynamic sitting balance (OT) Description: LTG:  Patient will maintain dynamic sitting balance with assistance during activities of daily living (OT) Flowsheets (Taken 02/25/2022 1213) LTG: Pt will maintain dynamic sitting balance during ADLs with: Independent Goal: LTG Patient will maintain dynamic standing with ADLs (OT) Description: LTG:  Patient will maintain dynamic standing balance with assist during activities of daily living (OT)  Flowsheets (Taken 02/25/2022 1213) LTG: Pt will maintain dynamic standing balance during ADLs with: Supervision/Verbal cueing   Problem: Sit to Stand Goal: LTG:  Patient will perform sit to stand in prep for activites of daily living with assistance level (OT) Description: LTG:  Patient will perform sit to stand in prep for activites of daily living with assistance level (OT) Flowsheets (Taken 02/25/2022 1213) LTG: PT will perform sit to stand in prep for activites of daily living with assistance level: Supervision/Verbal cueing   Problem: RH Eating Goal: LTG Patient will perform eating w/assist, cues/equip (OT) Description: LTG: Patient will perform eating with assist, with/without cues using equipment (OT) Flowsheets (Taken 02/25/2022 1213) LTG: Pt will perform eating with assistance level of: Set up assist    Problem: RH Grooming Goal: LTG Patient will perform grooming w/assist,cues/equip (OT) Description: LTG: Patient will perform grooming with assist, with/without cues using equipment (OT) Flowsheets (Taken 02/25/2022 1213) LTG: Pt will perform grooming with assistance level of: Set up assist    Problem: RH Bathing Goal: LTG Patient will bathe all body parts with assist levels (OT) Description: LTG: Patient will bathe all body parts with assist levels (OT) Flowsheets (Taken 02/25/2022 1213) LTG: Pt will perform bathing with assistance level/cueing: Supervision/Verbal cueing   Problem: RH  Dressing Goal: LTG Patient will perform upper body dressing (OT) Description: LTG Patient will perform upper body dressing with assist, with/without cues (OT). Flowsheets (Taken 02/25/2022 1213) LTG: Pt will perform upper body dressing with assistance level of: Independent Goal: LTG Patient will perform lower body dressing w/assist (OT) Description: LTG: Patient will perform lower body dressing with assist, with/without cues in positioning using equipment (OT) Flowsheets (Taken 02/25/2022 1213) LTG: Pt will perform lower body dressing with assistance level of: Supervision/Verbal cueing   Problem: RH Toileting Goal: LTG Patient will perform toileting task (3/3 steps) with assistance level (OT) Description: LTG: Patient will perform toileting task (3/3 steps) with assistance level (OT)  Flowsheets (Taken 02/25/2022 1213) LTG: Pt will perform toileting task (3/3 steps) with assistance level: Supervision/Verbal cueing   Problem: RH Functional Use of Upper Extremity Goal: LTG Patient will use RT/LT upper extremity as a (OT) Description: LTG: Patient will use right/left upper extremity as a stabilizer/gross assist/diminished/nondominant/dominant level with assist, with/without cues during functional activity (OT) Flowsheets (Taken 02/25/2022 1213) LTG: Use of upper extremity in functional activities: RUE as nondominant level   Problem: RH Toilet Transfers Goal: LTG Patient will perform toilet transfers w/assist (OT) Description: LTG: Patient will perform toilet transfers with assist, with/without cues using equipment (OT) Flowsheets (Taken 02/25/2022 1213) LTG: Pt will perform toilet transfers with assistance level of: Supervision/Verbal cueing   Problem: RH Tub/Shower Transfers Goal: LTG Patient will perform tub/shower transfers w/assist (OT) Description: LTG: Patient will perform tub/shower transfers with assist, with/without cues using equipment (OT) Flowsheets (Taken 02/25/2022 1213) LTG: Pt will  perform tub/shower stall transfers with assistance level of: Supervision/Verbal cueing   Problem: RH Attention Goal: LTG Patient will demonstrate this level of attention during functional activites (OT) Description: LTG:  Patient will demonstrate this level of  attention during functional activites  (OT) Flowsheets (Taken 02/25/2022 1213) LTG: Patient will demonstrate this level of attention during functional activites (OT): Supervision

## 2022-02-25 NOTE — Care Management (Deleted)
Inpatient Rehabilitation Center Individual Statement of Services  Patient Name:  William Acevedo  Date:  02/25/2022  Welcome to the [place].  Our goal is to provide you with an individualized program based on your diagnosis and situation, designed to meet your specific needs.  With this comprehensive rehabilitation program, you will be expected to participate in at least 3 hours of rehabilitation therapies Monday-Friday, with modified therapy programming on the weekends.  Your rehabilitation program will include the following services:  Physical Therapy (PT), Occupational Therapy (OT), Speech Therapy (ST), 24 hour per day rehabilitation nursing, Therapeutic Recreaction (TR), Psychology, Neuropsychology, Care Coordinator, Rehabilitation Medicine, [place], and Other  Weekly team conferences will be held on Wednesdays to discuss your progress.  Your Inpatient Rehabilitation Care Coordinator will talk with you frequently to get your input and to update you on team discussions.  Team conferences with you and your family in attendance may also be held.  Expected length of stay: 10-12 days  Overall anticipated outcome: Supervision  Depending on your progress and recovery, your program may change. Your Inpatient Rehabilitation Care Coordinator will coordinate services and will keep you informed of any changes. Your Inpatient Rehabilitation Care Coordinator's name and contact numbers are listed  below.  The following services may also be recommended but are not provided by the [place] will be made to provide these services after discharge if needed.  Arrangements include referral to agencies that provide these services.  Your insurance has been verified to be:  Engineer, maintenance primary doctor is:  [employer]  Pertinent information will be shared with your doctor and your insurance company.  Inpatient Rehabilitation Care Coordinator:  Cathleen Corti [phone] or ([phone]  Information discussed with and copy given to patient by: Rana Snare, 02/25/2022, 4:56 PM

## 2022-02-26 DIAGNOSIS — I6389 Other cerebral infarction: Secondary | ICD-10-CM | POA: Diagnosis not present

## 2022-02-26 DIAGNOSIS — R770 Abnormality of albumin: Secondary | ICD-10-CM | POA: Diagnosis not present

## 2022-02-26 DIAGNOSIS — F259 Schizoaffective disorder, unspecified: Secondary | ICD-10-CM

## 2022-02-26 DIAGNOSIS — E11649 Type 2 diabetes mellitus with hypoglycemia without coma: Secondary | ICD-10-CM | POA: Diagnosis not present

## 2022-02-26 DIAGNOSIS — K5900 Constipation, unspecified: Secondary | ICD-10-CM

## 2022-02-26 DIAGNOSIS — F32A Depression, unspecified: Secondary | ICD-10-CM

## 2022-02-26 LAB — GLUCOSE, CAPILLARY
Glucose-Capillary: 57 mg/dL — ABNORMAL LOW (ref 70–99)
Glucose-Capillary: 130 mg/dL — ABNORMAL HIGH (ref 70–99)
Glucose-Capillary: 61 mg/dL — ABNORMAL LOW (ref 70–99)
Glucose-Capillary: 201 mg/dL — ABNORMAL HIGH (ref 70–99)
Glucose-Capillary: 177 mg/dL — ABNORMAL HIGH (ref 70–99)
Glucose-Capillary: 266 mg/dL — ABNORMAL HIGH (ref 70–99)

## 2022-02-26 MED ORDER — INSULIN GLARGINE-YFGN 100 UNIT/ML ~~LOC~~ SOLN
12.0000 [IU] | Freq: Two times a day (BID) | SUBCUTANEOUS | Status: DC
  Administered 2022-02-26 – 2022-03-03 (×10): 12 [IU] via SUBCUTANEOUS
  Filled 2022-02-26 (×11): qty 0.12

## 2022-02-26 MED ORDER — SORBITOL 70 % SOLN
30.0000 mL | Freq: Every day | Status: DC | PRN
  Filled 2022-02-26: qty 30

## 2022-02-26 NOTE — Progress Notes (Signed)
PROGRESS NOTE   Subjective/Complaints: No new complaints or concerns this AM.  Hypoglycemic event this AM.   ROS: denies pain, CP, SOB   Objective:   No results found. Recent Labs    02/25/22 0528  WBC 6.1  HGB 10.8*  HCT 32.3*  PLT 199    Recent Labs    02/25/22 0528  NA 136  K 3.7  CL 99  CO2 29  GLUCOSE 91  BUN 30*  CREATININE 0.99  CALCIUM 8.7*     Intake/Output Summary (Last 24 hours) at 02/26/2022 1942 Last data filed at 02/26/2022 1900 Gross per 24 hour  Intake 777 ml  Output --  Net 777 ml         Physical Exam: Vital Signs Blood pressure (!) 103/50, pulse 81, temperature 98.5 F (36.9 C), temperature source Oral, resp. rate 16, weight 48.5 kg, SpO2 99 %. Gen: no distress, normal appearing seen at bedside, appears comfortable HEENT: oral mucosa pink and moist, NCAT Cardio: Reg rate Chest: CTAB, normal effort, normal rate of breathing Abd: soft, non-distended, positive bowel sounds Ext: no edema Psych: pleasant, normal affect Skin: intact Genitourinary:    Comments: Condom catheter with medium amber urine Musculoskeletal:     Cervical back: Neck supple. No tenderness.     Comments: RUE 4+/5 in biceps, triceps, WE< grip and FA 4/5 LUE 5/5 in same muscles RLE- HF 4-/5; otherwise KE/DF and PF 4+/5 LLE 5/5 in same muscles   Skin:    General: Skin is warm and dry.  Neurological:     Mental Status: He is alert.     Sensory: Sensory deficit present.     Coordination: Coordination abnormal.     Comments: Oriented to self, place as hospital (unable to state name) or situation. Polite with limited verbal output. He was able to follow simple motor commands.    Aphasic; at word level- some word substitutions Decreased fine motor movement on RUE/hand Decreased to light touch in LUE; otherwise normal/intact in RUE and B/L LE's  Psychiatric:     Comments: Extremely flat affect, decreased  interaction above and beyond the aphasia     Assessment/Plan: 1. Functional deficits which require 3+ hours per day of interdisciplinary therapy in a comprehensive inpatient rehab setting. Physiatrist is providing close team supervision and 24 hour management of active medical problems listed below. Physiatrist and rehab team continue to assess barriers to discharge/monitor patient progress toward functional and medical goals  Care Tool:  Bathing    Body parts bathed by patient: Right arm, Left arm, Abdomen, Chest, Buttocks, Right upper leg, Front perineal area, Left upper leg, Face   Body parts bathed by helper: Right lower leg, Left lower leg     Bathing assist Assist Level: Minimal Assistance - Patient > 75%     Upper Body Dressing/Undressing Upper body dressing   What is the patient wearing?: Pull over shirt    Upper body assist Assist Level: Minimal Assistance - Patient > 75%    Lower Body Dressing/Undressing Lower body dressing      What is the patient wearing?: Pants, Incontinence brief     Lower body assist Assist for lower  body dressing: Moderate Assistance - Patient 50 - 74%     Toileting Toileting    Toileting assist Assist for toileting: Moderate Assistance - Patient 50 - 74%     Transfers Chair/bed transfer  Transfers assist     Chair/bed transfer assist level: Minimal Assistance - Patient > 75%     Locomotion Ambulation   Ambulation assist      Assist level: Contact Guard/Touching assist Assistive device: Walker-rolling Max distance: 160f   Walk 10 feet activity   Assist     Assist level: Contact Guard/Touching assist Assistive device: Walker-rolling   Walk 50 feet activity   Assist    Assist level: Contact Guard/Touching assist Assistive device: Walker-rolling    Walk 150 feet activity   Assist    Assist level: Contact Guard/Touching assist Assistive device: Walker-rolling    Walk 10 feet on uneven surface   activity   Assist     Assist level: Moderate Assistance - Patient - 50 - 74%     Wheelchair     Assist Is the patient using a wheelchair?: Yes Type of Wheelchair: Manual    Wheelchair assist level: Minimal Assistance - Patient > 75% Max wheelchair distance: 50    Wheelchair 50 feet with 2 turns activity    Assist        Assist Level: Minimal Assistance - Patient > 75%   Wheelchair 150 feet activity     Assist      Assist Level: Moderate Assistance - Patient 50 - 74%   Blood pressure (!) 103/50, pulse 81, temperature 98.5 F (36.9 C), temperature source Oral, resp. rate 16, weight 48.5 kg, SpO2 99 %.  Medical Problem List and Plan: 1. Functional deficits secondary to New L ACA/PCA watershed strokes with R hemiparesis and aphasia             -patient may  shower             -ELOS/Goals: 12-14 days supervision to min A PT and OT; and min-mod A for SLP  -Continue CIR, PT OT SLP 2.  Antithrombotics: -DVT/anticoagulation:  Pharmaceutical: Lovenox             -antiplatelet therapy: DAPT X 3 weeks followed by Plavix alone.  3. Pain Management: Tylenol prn.  4. Mood/Behavior/Sleep: LCSW to follow for evaluation and suppor.t              -antipsychotic agents:  Zyprexa/hs 5. Neuropsych/cognition: This patient is not capable of making decisions on his own behalf. 6. Skin/Wound Care: Routine pressure relief measures.  7. Fluids/Electrolytes/Nutrition: Monitor I/O. Nephro TID for nutritional supplement.  --Check CMET in am.  8. Hypotension/HTN: Monitor BP TID--IVF d/c yesterday --will set hold parameters for Toprol XL 9. T2DM: Hgb A1c-10.6 and poorly controlled. Was on insulin at home per chart --continue insulin glargine 15 units BID (increased to bid on 02/08) --on Farxiga--continue to hold glucotrol Add metformin 5019mdaily. D/c Nepro             --Monitor BS ac/hs and use SSI for elevated BS.   2/10 decrease semglee to to 12u BID for hypoglycemic  event 10. Neuropathy: On Cymbalta and Neurontin BID.  11. Chronic abdominal pain/GERD: Continue Pepcid and Protonix.  12. Schizoaffective disorder and PTSD: Continue Zyprex, trazodone and cymbalta.   2/10 Psychiatry visit today, rec continue current medications, psych signed out 13. Elevated lipids: Chol-291 w/LDL-217. Continue Lipitor.  13. H/o ETOH/cocaine/polysubstance use: was (+) for cocaine and Marijuana and EtOH-Has  completed CIWA protocol. Metoprolol changed to coreg due to risks of hypotension with history of cocaine use 14. Underweight: encourage high protein diet.  15. Screening for vitamin D deficiency: f/u vitamin D level 16. Constipation.  -2/10 PRN miralax was given, sorbitol PRN started 17.  Low albumin  -Encourage protein intake  LOS: 2 days A FACE TO FACE EVALUATION WAS PERFORMED  Jennye Boroughs 02/26/2022, 7:42 PM

## 2022-02-26 NOTE — Progress Notes (Signed)
Miralax given for constipation. No BM since 2/7

## 2022-02-26 NOTE — Progress Notes (Signed)
Physical Therapy Session Note  Patient Details  Name: William Acevedo MRN: [id number] Date of Birth: 1955-10-26  Today's Date: 02/26/2022 PT Individual Time: 1625-1718 PT Individual Time Calculation (min): 53 min   Short Term Goals: Week 1:  PT Short Term Goal 1 (Week 1): Pt will ambulate >122f with CGA PT Short Term Goal 2 (Week 1): Pt will improve 5xSTS by >5sec to demonstrate imrpoved safety safety and reduced fall risk with functional mobility PT Short Term Goal 3 (Week 1): Pt will propell WC >102fwith supervision assist PT Short Term Goal 4 (Week 1): Pt will perform bed balance scale to measure fall risk in fucntional setting  Skilled Therapeutic Interventions/Progress Updates:    Pt received supine in bed. Pt at first restive to therapy but agreed soon after. Therapist noted incontinent bladder episode requiring a change in pts brief and bed sheets. Pt sat at EOB w/ supervision assist and transferred to standing w/ CGA from therapist. Pt was able to remain standing while therapist doffed and donned briefs and clean pants. [facility] amd was transported to therapy gym dependently.   Pt performed sit to stand transfer throughout session w/ CGA and BUE support [facility]. Bed mobility performed w/ supervision assist.   Pt performed standing reaching balance task by participating in corn hole. 1x10 alternating UE w/ CGA. Vc given to pt to keep feet still when attempting to reach for beanbags. LE NMR exercises done for strength and neuromotor control. Pt tasked w/ kicking over cones 4x w/ BLE in standing. Therapist providing Min A at pelvis to facilitate wt shift laterally to encourage wb through BLE. 2x1 min [facility]. Vc given to pt to push through entire ROM.   Dynamic Gait training: w/ no AD pt hand to hand walking w/ therapist ~1511f2 stepping over 4 obstacles Min A for postural control. Pt instructed to step up to the bars then  lift LE's over bars. Pt demonstrated anterior trunk rotation and hip IR to attempt to lift LLE. Therapist facilitated posterior rotation at hip and instructed pt to lift knees higher when stepping over bars.   Pt returned to room dependently in wc and left in supine w/ bed alarm on, call bell in reach and all needs met.     Therapy Documentation Precautions:  Precautions Precautions: Fall Restrictions Weight Bearing Restrictions: No General:     Pain: denied any pain        Therapy/Group: Individual Therapy  [name] 02/26/2022, 6:44 PM

## 2022-02-26 NOTE — Progress Notes (Signed)
Occupational Therapy Session Note  Patient Details  Name: William Acevedo MRN: [id number] Date of Birth: 1955/10/03  Today's Date: 02/26/2022 OT Individual Time: 0915-1030, 2:15-3:00 OT Individual Time Calculation (min): 75 min , 45 mins   Short Term Goals: Week 1:  OT Short Term Goal 1 (Week 1): Patient will integrate R UE into bathing tasks without cues OT Short Term Goal 2 (Week 1): Patient will tolerate standing for 5 minutes in preparation for BADL task OT Short Term Goal 3 (Week 1): Patient will complete 2 steps of toileting tasks.  Skilled Therapeutic Interventions/Progress Updates:    Patient seen this day for skilled OT to improve functional outcome in relation to BADL performance. The pt was in agreement to completing BADL related task in bathing at sink LOF The pt was able to transfer from bed level to EOB with SBA.  The pt was able to transfer from EOB to standing using the RW for ambulating to the restroom.  The pt was able to doff his brief and lower himself to the commode with SBA.  The pt was able to clean himself with close S.  The pt was able to come from sit to stand using the grab bars with CGA for transferring to the w/c.  The pt was transported to the main living quarters and was able to bathe himself at sink level while seated in the w/c with s/u for his UB and ModA for coming from sit to stand to wash his bottom secondary to challenges with functional balance.  The pt was able to dry himself off and apply deodorant with s/u assist.  The pt went on to donn his over head shirt with s/u assist and MinA for donning his brief and pants.  The pt was able to MaxA for donning his non skid socks.  The pt remained at w/c LOF with his chair alarm activated and bed side table and call light with in reach.  All additional needs were addressed prior to exiting the room.     (2nd) OT Session:  The pt was able to transfer for bed LOF to EOB to transfer for OT services.  The pt  transferred from EOB to w/c LOF with CGA.  The pt was transported to the gym and was able to complete the [place] for 20 minutes in duration with 3 rest breaks.  The pt went on to complete sit to stands 3 for a count of 10 using a chair placed in front for additional balance for coming in to full standing position with attention to static and dynamic standing balance to improve his functional mobility, reducing incidence for falls. The pt went on to complete resistive exercises using a 1lb dowel with external force exerted on the dowel  3x for a count of 10. The pt was transported back to his room and he was able to transfer from the w/c to the bed with MinA using the bed rail for additional balance .  The call light and bedside table were with in reach and all additional needs were addressed.   Therapy Documentation Precautions:  Precautions Precautions: Fall Restrictions Weight Bearing Restrictions: No   Therapy/Group: Individual Therapy  Yvonne Kendall 02/26/2022, 3:31 PM

## 2022-02-26 NOTE — Progress Notes (Signed)
Physical Therapy Session Note  Patient Details  Name: William Acevedo MRN: [id number] Date of Birth: [birth year]  Today's Date: 02/26/2022 PT Individual Time: 1116-1157 PT Individual Time Calculation (min): 41 min   Short Term Goals: Week 1:  PT Short Term Goal 1 (Week 1): Pt will ambulate >178f with CGA PT Short Term Goal 2 (Week 1): Pt will improve 5xSTS by >5sec to demonstrate imrpoved safety safety and reduced fall risk with functional mobility PT Short Term Goal 3 (Week 1): Pt will propell WC >1065fwith supervision assist PT Short Term Goal 4 (Week 1): Pt will perform bed balance scale to measure fall risk in fucntional setting  Skilled Therapeutic Interventions/Progress Updates:   Received pt supine in bed, pt agreeable to PT treatment, and denied any pain during session. Session with emphasis on functional mobility/transfers, generalized strengthening and endurance, dynamic standing balance/coordination, NMR, and gait training. Pt transferred supine<>sitting L EOB from flat bed with CGA and cues for technique and use of bedrails. Pt transferred bed<>WC stand<>pivot to L without AD and heavy CGA/light min A and [facility]. Pt stood with RW and CGA and ambulated [id number] RW and CGA. Took brief rest break then stood again without AD and CGA and ambulated additional [id number] AD and light min A. Pt ambulates with short shuffling steps but improved with cues and with increased distance. Pt then performed standing alternating toe taps to 6in step 2x20 with min HHA. Transitioned to sit<>stands on Airex x 3 reps with min A and heavy use of UEs - unable to perform without UE support - reported increased pain in RLE and difficulty with task. Pt then performed TUG without AD and min HHA with average of 28.3 seconds. Educated pt on test results and significance indicating high fall risk: Trial 1: 33 seconds Trial 2: 26 seconds   Trial 3: 26 seconds  Returned to room and encouraged pt to remain sitting up to eat lunch. Concluded session with pt sitting in WC, needs within reach, and seatbelt alarm on.   Therapy Documentation Precautions:  Precautions Precautions: Fall Restrictions Weight Bearing Restrictions: No  Therapy/Group: Individual Therapy AnnaAlfonse Alpers DPT  02/26/2022, 7:10 AM

## 2022-02-26 NOTE — Significant Event (Addendum)
Hypoglycemia   CBG 57 @ [id number]. Hypoglycemic protocols put into place. Standing orders ordered. Following parameters patient given 240 ml of lemon lime soda @ 0620. CBG recheck at [phone].  Behavioral change in patient this morning. Refusing orthostatic vitals. Decreased energy and mild slurring with speech. Also, observed delay with speech and difficulty identifying words wanting to say. Denies any N/V symptoms this morning. Endorses sensation of being cold. No sensation of feeling hot or flush. Does not endorse sensation of dizziness at this time.  Charge RN is made aware of significant event.

## 2022-02-26 NOTE — IPOC Note (Signed)
Overall Plan of Care [facility]) Patient Details Name: William Acevedo MRN: [id number] DOB: March 27, 1955  Admitting Diagnosis: Acute ischemic multifocal multiple vascular territories stroke [facility])  Hospital Problems: Principal Problem:   Acute ischemic multifocal multiple vascular territories stroke [facility]) Active Problems:   DM2 (diabetes mellitus, type 2) ([place])   CVA (cerebral vascular accident) ([place])   Schizoaffective disorder ([place])     Functional Problem List: Nursing Bladder, Bowel, Medication Management, Endurance, Safety, Pain  PT Balance, Behavior, Endurance, Motor, Perception, Safety, Sensory  OT Balance, Cognition, Behavior, Endurance, Motor, Perception, Safety, Vision  SLP  (N/A)  TR         Basic ADL's: OT Eating, Grooming, Bathing, Dressing, Toileting     Advanced  ADL's: OT       Transfers: PT Bed Mobility, Bed to Chair, Car, [employer], Floor  OT Tub/Shower, Agricultural engineer: PT Ambulation, Emergency planning/management officer, Stairs     Additional Impairments: OT Fuctional Use of Upper Extremity  SLP  (N/A)      TR      Anticipated Outcomes Item Anticipated Outcome  Self Feeding Set-up  Swallowing  N/A   Basic self-care  Supervision  Toileting  Supervision   Bathroom Transfers SUpervision  Bowel/Bladder  manage bowel wtih mod I assist and bladder w toileting  Transfers  supervision Assist  Locomotion  supervision assist with LRAD ambulatory  Communication  N/A  Cognition  N/A  Pain  < 4 with prns  Safety/Judgment  manage w cues   Therapy Plan: PT Intensity: Minimum of 1-2 x/day ,45 to 90 minutes PT Frequency: 5 out of 7 days PT Duration Estimated Length of Stay: 10-12 days OT Intensity: Minimum of 1-2 x/day, 45 to 90 minutes OT Frequency: 5 out of 7 days OT Duration/Estimated Length of Stay: 10-12 days SLP Intensity:  (N/A) SLP Frequency:  (N/A) SLP Duration/Estimated Length of Stay: N/A for ST   Team Interventions: Nursing Interventions  Patient/Family Education, Bladder Management, Bowel Management, Disease Management/Prevention, Pain Management, Medication Management, Discharge Planning  PT interventions Ambulation/gait training, Balance/vestibular training, Cognitive remediation/compensation, Disease management/prevention, Discharge planning, Community reintegration, DME/adaptive equipment instruction, Functional mobility training, Patient/family education, Neuromuscular re-education, Functional electrical stimulation, Pain management, Splinting/orthotics, Skin care/wound management, Psychosocial support, Stair training, Therapeutic Activities, UE/LE Strength taining/ROM, Wheelchair propulsion/positioning, UE/LE Coordination activities, Therapeutic Exercise, Visual/perceptual remediation/compensation  OT Interventions Training and development officer, Cognitive remediation/compensation, Community reintegration, Discharge planning, Disease mangement/prevention, DME/adaptive equipment instruction, Functional electrical stimulation, Functional mobility training, Neuromuscular re-education, Pain management, Patient/family education, Self Care/advanced ADL retraining, Skin care/wound managment, Splinting/orthotics, Therapeutic Activities, Psychosocial support, Therapeutic Exercise, UE/LE Strength taining/ROM, UE/LE Coordination activities, Visual/perceptual remediation/compensation, Wheelchair propulsion/positioning  SLP Interventions  (N/A)  TR Interventions    SW/CM Interventions Discharge Planning, Psychosocial Support, Patient/Family Education   Barriers to Discharge MD  Medical stability  Nursing Decreased caregiver support 1 level ramped entry w brother; has DME  PT Decreased caregiver support, Home environment access/layout, Incontinence, Lack of/limited family support, Insurance for SNF coverage, Medication compliance, Behavior    OT      SLP  (N/A)    SW Decreased caregiver support, Lack of/limited family support, Insurance underwriter for  SNF coverage     Team Discharge Planning: Destination: PT-Home ,OT- Home , SLP-Home Projected Follow-up: PT-Home health PT, OT-  24 hour supervision/assistance, Outpatient OT, SLP-24 hour supervision/assistance Projected Equipment Needs: PT-To be determined, OT- To be determined, SLP-None recommended by SLP Equipment Details: PT- , OT-TBD Patient/family involved in discharge planning: PT- Patient unable/family or caregiver not  available,  OT-Patient, SLP-Patient, Family member/caregiver  MD ELOS: 10-14 days Medical Rehab Prognosis:  Excellent Assessment: The patient has been admitted for CIR therapies with the diagnosis of CVA.The team will be addressing functional mobility, strength, stamina, balance, safety, adaptive techniques and equipment, self-care, bowel and bladder mgt, patient and caregiver education. Goals have been set at supervision. Anticipated discharge destination is home.       See Team Conference Notes for weekly updates to the plan of care

## 2022-02-26 NOTE — Consult Note (Signed)
[facility] Psychiatry Consult   Reason for Consult: ''suicidal ideation.'' Referring Physician:  Leeroy Cha, MD Patient Identification: MALIKK SIMRELL MRN:  [id number] Principal Diagnosis: Acute ischemic multifocal multiple vascular territories stroke [facility]) Diagnosis:  Principal Problem:   Acute ischemic multifocal multiple vascular territories stroke [facility]) Active Problems:   DM2 (diabetes mellitus, type 2) ([place])   Cocaine abuse ([place])   CVA (cerebral vascular accident) ([place])   Schizoaffective disorder ([place])   Cannabis dependence, continuous abuse ([place])   Total Time spent with patient: 1 hour  Subjective:   William Acevedo is a 67 y.o. male patient admitted with lethargy, vomiting and decreased oral intake.  HPI:  67 y.o. male with medical history significant for CAD s/p NSTEMI, hyperlipidemia, DMII insulin dependent, cerebral thrombosis with CVA, Essential hypertension, hx of polysubstance abuse , hx of endocarditis, hx of pulmonary cryptococosis, GERD, etoh abuse, Schizoaffectic disorder, PTSD who was admitted to medical floor due to lethargy, vomiting and decreased oral intake. Per chart review, patient was found to have acute to early subacute ischemic infarct when he was on medical unit. He was transferred to medical rehab unit for further treatment after he was deemed medically stable. He was referred to psych consult service yesterday due to suicidal ideation. Today, patient reports being a little depressed but denies psychosis, delusions, homicidal/suicidal ideations, intent or plan. He reports being diagnosed with mental illness many years and says he feel better on current medications and also feel safe on the unit.  Past Psychiatric History: as above  Risk to Self:  denies Risk to Others:  denies Prior Inpatient Therapy:  unknown by pt Prior Outpatient Therapy:  yes  Past Medical History:  Past Medical History:  Diagnosis Date   Carpal tunnel syndrome     Cocaine use    Depression    Diabetes mellitus without complication ([place])    Endocarditis    ETOH abuse    Hypertensive urgency 04/14/2020   Neuropathy    PTSD (post-traumatic stress disorder)    Stroke (cerebrum) ([place])    multiple   Zoster     Past Surgical History:  Procedure Laterality Date   BUBBLE STUDY  03/30/2020   Procedure: BUBBLE STUDY;  Surgeon: Jerline Pain, MD;  Location: [facility];  Service: Cardiovascular;;   TEE WITHOUT CARDIOVERSION N/A 03/30/2020   Procedure: TRANSESOPHAGEAL ECHOCARDIOGRAM (TEE);  Surgeon: Jerline Pain, MD;  Location[facility] ENDOSCOPY;  Service: Cardiovascular;  Laterality: N/A;   TESTICLE TORSION REDUCTION     Family History: No family history on file. Family Psychiatric  History:  Social History:  Social History   Substance and Sexual Activity  Alcohol Use Yes     Social History   Substance and Sexual Activity  Drug Use Yes   Types: Cocaine    Social History   Socioeconomic History   Marital status: Married    Spouse name: Not on file   Number of children: Not on file   Years of education: Not on file   Highest education level: Not on file  Occupational History   Not on file  Tobacco Use   Smoking status: Every Day    Packs/day: 0.50    Types: Cigarettes   Smokeless tobacco: Never  Substance and Sexual Activity   Alcohol use: Yes   Drug use: Yes    Types: Cocaine   Sexual activity: Not on file  Other Topics Concern   Not on file  Social History Narrative   Not on  file   Social Determinants of Health   Financial Resource Strain: Not on file  Food Insecurity: Not on file  Transportation Needs: Not on file  Physical Activity: Not on file  Stress: Not on file  Social Connections: Not on file   Additional Social History:    Allergies:   Allergies  Allergen Reactions   Lisinopril Swelling    Angioedema Pt reports not allergic 06/07/20   Omeprazole     Other reaction(s): ANGIOEDEMA OF LIPS, Angioedema of  tongue. Pt reports not allergic 06/07/20    Labs:  Results for orders placed or performed during the hospital encounter of 02/24/22 (from the past 48 hour(s))  Glucose, capillary     Status: Abnormal   Collection Time: 02/24/22 10:17 PM  Result Value Ref Range   Glucose-Capillary 175 (H) 70 - 99 mg/dL    Comment: Glucose reference range applies only to samples taken after fasting for at least 8 hours.   Comment 1 Notify RN   Comprehensive metabolic panel     Status: Abnormal   Collection Time: 02/25/22  5:28 AM  Result Value Ref Range   Sodium 136 135 - 145 mmol/L   Potassium 3.7 3.5 - 5.1 mmol/L   Chloride 99 98 - 111 mmol/L   CO2 29 22 - 32 mmol/L   Glucose, Bld 91 70 - 99 mg/dL    Comment: Glucose reference range applies only to samples taken after fasting for at least 8 hours.   BUN 30 (H) 8 - 23 mg/dL   Creatinine, Ser 0.99 0.61 - 1.24 mg/dL   Calcium 8.7 (L) 8.9 - 10.3 mg/dL   Total Protein 5.3 (L) 6.5 - 8.1 g/dL   Albumin 2.3 (L) 3.5 - 5.0 g/dL   AST 21 15 - 41 U/L   ALT 18 0 - 44 U/L   Alkaline Phosphatase 40 38 - 126 U/L   Total Bilirubin <0.1 (L) 0.3 - 1.2 mg/dL   GFR, Estimated >60 >60 mL/min    Comment: (NOTE) Calculated using the CKD-EPI Creatinine Equation (2021)    Anion gap 8 5 - 15    Comment: Performed at [facility], [place] [address], [place], [place] [address]  CBC with Differential/Platelet     Status: Abnormal   Collection Time: 02/25/22  5:28 AM  Result Value Ref Range   WBC 6.1 4.0 - 10.5 K/uL   RBC 3.72 (L) 4.22 - 5.81 MIL/uL   Hemoglobin 10.8 (L) 13.0 - 17.0 g/dL   HCT 32.3 (L) 39.0 - 52.0 %   MCV 86.8 80.0 - 100.0 fL   MCH 29.0 26.0 - 34.0 pg   MCHC 33.4 30.0 - 36.0 g/dL   RDW 13.1 11.5 - 15.5 %   Platelets 199 150 - 400 K/uL   nRBC 0.0 0.0 - 0.2 %   Neutrophils Relative % 53 %   Neutro Abs 3.2 1.7 - 7.7 K/uL   Lymphocytes Relative 36 %   Lymphs Abs 2.2 0.7 - 4.0 K/uL   Monocytes Relative 11 %   Monocytes Absolute 0.7 0.1 - 1.0 K/uL    Eosinophils Relative 0 %   Eosinophils Absolute 0.0 0.0 - 0.5 K/uL   Basophils Relative 0 %   Basophils Absolute 0.0 0.0 - 0.1 K/uL   Immature Granulocytes 0 %   Abs Immature Granulocytes 0.02 0.00 - 0.07 K/uL    Comment: Performed at [facility], [address] [place], [place] [address]  Magnesium  Status: None   Collection Time: 02/25/22  5:28 AM  Result Value Ref Range   Magnesium 2.3 1.7 - 2.4 mg/dL    Comment: Performed at [facility], [place] [address] [place], [place] [address]  Glucose, capillary     Status: Abnormal   Collection Time: 02/25/22 12:31 PM  Result Value Ref Range   Glucose-Capillary 166 (H) 70 - 99 mg/dL    Comment: Glucose reference range applies only to samples taken after fasting for at least 8 hours.  Glucose, capillary     Status: Abnormal   Collection Time: 02/25/22  4:48 PM  Result Value Ref Range   Glucose-Capillary 165 (H) 70 - 99 mg/dL    Comment: Glucose reference range applies only to samples taken after fasting for at least 8 hours.  Glucose, capillary     Status: Abnormal   Collection Time: 02/25/22  9:16 PM  Result Value Ref Range   Glucose-Capillary 173 (H) 70 - 99 mg/dL    Comment: Glucose reference range applies only to samples taken after fasting for at least 8 hours.  Glucose, capillary     Status: Abnormal   Collection Time: 02/26/22  6:08 AM  Result Value Ref Range   Glucose-Capillary 57 (L) 70 - 99 mg/dL    Comment: Glucose reference range applies only to samples taken after fasting for at least 8 hours.   Comment 1 Notify RN   Glucose, capillary     Status: Abnormal   Collection Time: 02/26/22  6:33 AM  Result Value Ref Range   Glucose-Capillary 61 (L) 70 - 99 mg/dL    Comment: Glucose reference range applies only to samples taken after fasting for at least 8 hours.   Comment 1 Notify RN   Glucose, capillary     Status: Abnormal   Collection Time: 02/26/22  7:06 AM  Result Value Ref Range   Glucose-Capillary  130 (H) 70 - 99 mg/dL    Comment: Glucose reference range applies only to samples taken after fasting for at least 8 hours.  Glucose, capillary     Status: Abnormal   Collection Time: 02/26/22 12:34 PM  Result Value Ref Range   Glucose-Capillary 201 (H) 70 - 99 mg/dL    Comment: Glucose reference range applies only to samples taken after fasting for at least 8 hours.    Current Facility-Administered Medications  Medication Dose Route Frequency Provider Last Rate Last Admin   acetaminophen (TYLENOL) tablet 325-650 mg  325-650 mg Oral Q4H PRN Bary Leriche, PA-C   650 mg at 02/25/22 0922   alum & mag hydroxide-simeth (MAALOX/MYLANTA) 200-200-20 MG/5ML suspension 30 mL  30 mL Oral Q4H PRN Love, Pamela S, PA-C       aspirin EC tablet 81 mg  81 mg Oral Daily Raulkar, Clide Deutscher, MD   81 mg at 02/26/22 0817   atorvastatin (LIPITOR) tablet 80 mg  80 mg Oral QHS Bary Leriche, PA-C   80 mg at 02/25/22 2305   bisacodyl (DULCOLAX) suppository 10 mg  10 mg Rectal Daily PRN Love, Pamela S, PA-C       carvedilol (COREG) tablet 3.125 mg  3.125 mg Oral BID WC Raulkar, Clide Deutscher, MD   3.125 mg at 02/26/22 [id number]   clopidogrel (PLAVIX) tablet 75 mg  75 mg Oral Daily Bary Leriche, PA-C   75 mg at 02/26/22 [id number]   dapagliflozin propanediol (FARXIGA) tablet 10 mg  10 mg Oral Daily Love, Ivan Anchors, PA-C  10 mg at 02/26/22 0817   diphenhydrAMINE (BENADRYL) 12.5 MG/5ML elixir 12.5-25 mg  12.5-25 mg Oral Q6H PRN Love, Pamela S, PA-C       DULoxetine (CYMBALTA) DR capsule 60 mg  60 mg Oral Daily Bary Leriche, PA-C   60 mg at 02/26/22 0817   enoxaparin (LOVENOX) injection 40 mg  40 mg Subcutaneous Q24H Love, Pamela S, PA-C   40 mg at 02/25/22 2306   famotidine (PEPCID) tablet 20 mg  20 mg Oral QAC breakfast Bary Leriche, PA-C   20 mg at [id number]   folic acid (FOLVITE) tablet 1 mg  1 mg Oral Daily Bary Leriche, PA-C   1 mg at 02/26/22 0818   gabapentin (NEURONTIN) capsule 300 mg  300 mg Oral BID Bary Leriche, PA-C   300 mg at 02/26/22 [id number]   guaiFENesin-dextromethorphan (ROBITUSSIN DM) 100-10 MG/5ML syrup 5-10 mL  5-10 mL Oral Q6H PRN Reesa Chew S, PA-C       influenza vaccine adjuvanted (FLUAD) injection 0.5 mL  0.5 mL Intramuscular Tomorrow-1000 Raulkar, Clide Deutscher, MD       insulin aspart (novoLOG) injection 0-5 Units  0-5 Units Subcutaneous QHS Love, Pamela S, PA-C       insulin aspart (novoLOG) injection 0-9 Units  0-9 Units Subcutaneous TID WC Bary Leriche, PA-C   3 Units at 02/26/22 1235   insulin glargine-yfgn (SEMGLEE) injection 15 Units  15 Units Subcutaneous BID Bary Leriche, [place]   15 Units at 02/26/22 0818   melatonin tablet 6 mg  6 mg Oral QHS PRN Love, Pamela S, PA-C       metFORMIN (GLUCOPHAGE) tablet 500 mg  500 mg Oral Q breakfast Raulkar, Clide Deutscher, MD   500 mg at 02/26/22 0818   multivitamin with minerals tablet 1 tablet  1 tablet Oral Daily Bary Leriche, PA-C   1 tablet at 02/26/22 0817   OLANZapine (ZYPREXA) tablet 5 mg  5 mg Oral QHS Love, Pamela S, PA-C   5 mg at 02/25/22 2305   Oral care mouth rinse  15 mL Mouth Rinse PRN Love, Pamela S, PA-C       polyethylene glycol (MIRALAX / GLYCOLAX) packet 17 g  17 g Oral Daily PRN Bary Leriche, PA-C   17 g at 02/26/22 0820   prochlorperazine (COMPAZINE) tablet 5-10 mg  5-10 mg Oral Q6H PRN Love, Pamela S, PA-C       Or   prochlorperazine (COMPAZINE) injection 5-10 mg  5-10 mg Intramuscular Q6H PRN Love, Pamela S, PA-C       Or   prochlorperazine (COMPAZINE) suppository 12.5 mg  12.5 mg Rectal Q6H PRN Love, Pamela S, PA-C       senna-docusate (Senokot-S) tablet 1 tablet  1 tablet Oral QHS PRN Love, Pamela S, PA-C       sodium phosphate (FLEET) 7-19 GM/118ML enema 1 enema  1 enema Rectal Once PRN Love, Pamela S, PA-C       traZODone (DESYREL) tablet 100 mg  100 mg Oral QHS Love, Pamela S, PA-C   100 mg at 02/25/22 2305    Musculoskeletal: Strength & Muscle Tone: within normal limits Gait & Station: normal Patient leans:  N/A    Psychiatric Specialty Exam:  Presentation  General Appearance:  Fairly Groomed  Eye Contact: Good  Speech: Clear and Coherent; Normal Rate  Speech Volume: Normal  Handedness: Right   Mood and Affect  Mood: Euthymic  Affect: Congruent  Thought Process  Thought Processes: Coherent; Linear  Descriptions of Associations:Intact  Orientation:Full (Time, Place and Person)  Thought Content:Logical  History of Schizophrenia/Schizoaffective disorder:No data recorded Duration of Psychotic Symptoms:No data recorded Hallucinations:No data recorded Ideas of Reference:None  Suicidal Thoughts:No data recorded Homicidal Thoughts:No data recorded  Sensorium  Memory: Immediate Good; Recent Good; Remote Good  Judgment: Intact  Insight: Fair   Community education officer  Concentration: Good  Attention Span: Good  Recall: Good  Fund of Knowledge: Good  Language: Good   Psychomotor Activity  Psychomotor Activity:No data recorded  Assets  Assets: Financial Resources/Insurance; Housing   Sleep  Sleep:No data recorded  Physical Exam: Physical Exam Review of Systems  Psychiatric/Behavioral:  Positive for substance abuse.    Blood pressure 135/76, pulse 74, temperature 98.4 F (36.9 C), resp. rate 18, weight 48.5 kg, SpO2 96 %. Body mass index is 17.26 kg/m.  Treatment Plan Summary: 67 year old man with long history of mental illness who claims to be stable on current medications. Patient reports residual symptoms of depression but denies self harming thoughts, psychosis, agitation, mood swings and delusional thinking.  Plan/Recommendations: -Continue current medications-Zyprexa, Gabapentin and Trazodone as prescribed. -Refer patient to outpatient psychiatry office for medication management upon discharge  Disposition: Patient does not meet criteria for psychiatric inpatient admission. Supportive therapy provided about ongoing  stressors. Psychiatric consult service signing out. Re-consult as needed  Corena Pilgrim, MD 02/26/2022 3:48 PM

## 2022-02-27 DIAGNOSIS — K5900 Constipation, unspecified: Secondary | ICD-10-CM | POA: Diagnosis not present

## 2022-02-27 DIAGNOSIS — I10 Essential (primary) hypertension: Secondary | ICD-10-CM

## 2022-02-27 DIAGNOSIS — F259 Schizoaffective disorder, unspecified: Secondary | ICD-10-CM | POA: Diagnosis not present

## 2022-02-27 DIAGNOSIS — E11649 Type 2 diabetes mellitus with hypoglycemia without coma: Secondary | ICD-10-CM | POA: Diagnosis not present

## 2022-02-27 DIAGNOSIS — I6389 Other cerebral infarction: Secondary | ICD-10-CM | POA: Diagnosis not present

## 2022-02-27 LAB — GLUCOSE, CAPILLARY
Glucose-Capillary: 277 mg/dL — ABNORMAL HIGH (ref 70–99)
Glucose-Capillary: 224 mg/dL — ABNORMAL HIGH (ref 70–99)
Glucose-Capillary: 200 mg/dL — ABNORMAL HIGH (ref 70–99)
Glucose-Capillary: 216 mg/dL — ABNORMAL HIGH (ref 70–99)

## 2022-02-27 MED ORDER — INSULIN ASPART 100 UNIT/ML IJ SOLN
0.0000 [IU] | Freq: Three times a day (TID) | INTRAMUSCULAR | Status: DC
  Administered 2022-02-27: 3 [IU] via SUBCUTANEOUS
  Administered 2022-02-27: 5 [IU] via SUBCUTANEOUS
  Administered 2022-02-28 (×2): 2 [IU] via SUBCUTANEOUS
  Administered 2022-03-01 (×2): 3 [IU] via SUBCUTANEOUS
  Administered 2022-03-02: 2 [IU] via SUBCUTANEOUS
  Administered 2022-03-02: 5 [IU] via SUBCUTANEOUS

## 2022-02-27 MED ORDER — SORBITOL 70 % SOLN
30.0000 mL | Freq: Once | Status: AC
  Administered 2022-02-27: 30 mL via ORAL

## 2022-02-27 NOTE — Progress Notes (Signed)
PROGRESS NOTE   Subjective/Complaints: Patient seen at bedside, no new concerns elicited.  Sleeping when I first came in but awakes to voice. CBGs have been elevated today.   ROS: denies pain, CP, SOB, abdominal pain   Objective:   No results found. Recent Labs    02/25/22 0528  WBC 6.1  HGB 10.8*  HCT 32.3*  PLT 199    Recent Labs    02/25/22 0528  NA 136  K 3.7  CL 99  CO2 29  GLUCOSE 91  BUN 30*  CREATININE 0.99  CALCIUM 8.7*     Intake/Output Summary (Last 24 hours) at 02/27/2022 1106 Last data filed at 02/27/2022 0742 Gross per 24 hour  Intake 1137 ml  Output --  Net 1137 ml         Physical Exam: Vital Signs Blood pressure (!) 174/91, pulse 79, temperature 98 F (36.7 C), temperature source Oral, resp. rate 18, weight 48.5 kg, SpO2 98 %. Gen: no distress, normal appearing seen at bedside, appears comfortable HEENT: oral mucosa pink and moist, NCAT Cardio: Reg rate Chest: CTAB, normal effort, normal rate of breathing Abd: soft, non-distended, positive bowel sounds Ext: no edema Psych: flat affect Skin: intact Genitourinary:    Comments: Condom catheter with medium amber urine Musculoskeletal:     Cervical back: Neck supple. No tenderness.     Comments: RUE 4+/5 in biceps, triceps, WE< grip and FA 4/5 LUE 5/5 in same muscles RLE- HF 4-/5; otherwise KE/DF and PF 4+/5 LLE 5/5 in same muscles   Skin:    General: Skin is warm and dry.  Neurological:     Mental Status: He is alert.     Sensory: Sensory deficit present.     Coordination: Coordination abnormal.     Comments: Sleeping this AM but wakes to voice. Oriented to self, place as hospital (unable to state name) or situation. Polite with limited verbal output. He was able to follow simple motor commands.    Aphasic; at word level- some word substitutions Decreased fine motor movement on RUE/hand Decreased to light touch in LUE;  otherwise normal/intact in RUE and B/L LE's     Assessment/Plan: 1. Functional deficits which require 3+ hours per day of interdisciplinary therapy in a comprehensive inpatient rehab setting. Physiatrist is providing close team supervision and 24 hour management of active medical problems listed below. Physiatrist and rehab team continue to assess barriers to discharge/monitor patient progress toward functional and medical goals  Care Tool:  Bathing    Body parts bathed by patient: Right arm, Left arm, Abdomen, Chest, Buttocks, Right upper leg, Front perineal area, Left upper leg, Face   Body parts bathed by helper: Right lower leg, Left lower leg     Bathing assist Assist Level: Minimal Assistance - Patient > 75%     Upper Body Dressing/Undressing Upper body dressing   What is the patient wearing?: Pull over shirt    Upper body assist Assist Level: Minimal Assistance - Patient > 75%    Lower Body Dressing/Undressing Lower body dressing      What is the patient wearing?: Pants, Incontinence brief     Lower body assist  Assist for lower body dressing: Moderate Assistance - Patient 50 - 74%     Toileting Toileting    Toileting assist Assist for toileting: Moderate Assistance - Patient 50 - 74%     Transfers Chair/bed transfer  Transfers assist     Chair/bed transfer assist level: Minimal Assistance - Patient > 75%     Locomotion Ambulation   Ambulation assist      Assist level: Contact Guard/Touching assist Assistive device: Walker-rolling Max distance: 120f   Walk 10 feet activity   Assist     Assist level: Contact Guard/Touching assist Assistive device: Walker-rolling   Walk 50 feet activity   Assist    Assist level: Contact Guard/Touching assist Assistive device: Walker-rolling    Walk 150 feet activity   Assist    Assist level: Contact Guard/Touching assist Assistive device: Walker-rolling    Walk 10 feet on uneven surface   activity   Assist     Assist level: Moderate Assistance - Patient - 50 - 74%     Wheelchair     Assist Is the patient using a wheelchair?: Yes Type of Wheelchair: Manual    Wheelchair assist level: Minimal Assistance - Patient > 75% Max wheelchair distance: 50    Wheelchair 50 feet with 2 turns activity    Assist        Assist Level: Minimal Assistance - Patient > 75%   Wheelchair 150 feet activity     Assist      Assist Level: Moderate Assistance - Patient 50 - 74%   Blood pressure (!) 174/91, pulse 79, temperature 98 F (36.7 C), temperature source Oral, resp. rate 18, weight 48.5 kg, SpO2 98 %.  Medical Problem List and Plan: 1. Functional deficits secondary to [place] ACA/PCA watershed strokes with R hemiparesis and aphasia             -patient may  shower             -ELOS/Goals: 12-14 days supervision to min A PT and OT; and min-mod A for SLP  -Continue CIR, PT OT SLP 2.  Antithrombotics: -DVT/anticoagulation:  Pharmaceutical: Lovenox             -antiplatelet therapy: DAPT X 3 weeks followed by Plavix alone.  3. Pain Management: Tylenol prn.  4. Mood/Behavior/Sleep: LCSW to follow for evaluation and suppor.t              -antipsychotic agents:  Zyprexa/hs 5. Neuropsych/cognition: This patient is not capable of making decisions on his own behalf. 6. Skin/Wound Care: Routine pressure relief measures.  7. Fluids/Electrolytes/Nutrition: Monitor I/O. Nephro TID for nutritional supplement.  --Check CMET in am.  8. Hypotension/HTN: Monitor BP TID--IVF d/c yesterday --will set hold parameters for Toprol XL -2/11 elevated this AM but has been controlled, continue to monitor trend    02/27/2022    4:36 AM 02/26/2022    7:27 PM 02/26/2022    1:16 PM  Vitals with BMI  Systolic [id number] Diastolic 91 50 76  Pulse 79 81 74    9. T2DM: Hgb A1c-10.6 and poorly controlled. Was on insulin at home per chart --continue insulin glargine 15 units BID  (increased to bid on 02/08) --on Farxiga--continue to hold glucotrol Add metformin 5043mdaily. D/c Nepro             --Monitor BS ac/hs and use SSI for elevated BS.   2/10 decrease semglee to to 12u BID for hypoglycemic event  2/11  discussed with pharmacy- CBG has been elevated, will change from sensitive to moderate SSI scale 10. Neuropathy: On Cymbalta and Neurontin BID.  11. Chronic abdominal pain/GERD: Continue Pepcid and Protonix.  12. Schizoaffective disorder and PTSD: Continue Zyprex, trazodone and cymbalta.   2/10 Psychiatry visit today, rec continue current medications, psych signed out  Appears stable continue current medications 13. Elevated lipids: Chol-291 w/LDL-217. Continue Lipitor.  13. H/o ETOH/cocaine/polysubstance use: was (+) for cocaine and Marijuana and EtOH-Has completed CIWA protocol. Metoprolol changed to coreg due to risks of hypotension with history of cocaine use 14. Underweight: encourage high protein diet.  15. Screening for vitamin D deficiency: f/u vitamin D level 16. Constipation.  -2/10 PRN miralax was given, sorbitol PRN started  -2/11 LBM 2/7 order sorbitol 17.  Low albumin  -Encourage protein intake  LOS: 3 days A FACE TO FACE EVALUATION WAS PERFORMED  Jennye Boroughs 02/27/2022, 11:06 AM

## 2022-02-28 DIAGNOSIS — I6389 Other cerebral infarction: Secondary | ICD-10-CM | POA: Diagnosis not present

## 2022-02-28 LAB — BASIC METABOLIC PANEL
Anion gap: 8 (ref 5–15)
BUN: 28 mg/dL — ABNORMAL HIGH (ref 8–23)
CO2: 27 mmol/L (ref 22–32)
Calcium: 8.7 mg/dL — ABNORMAL LOW (ref 8.9–10.3)
Chloride: 100 mmol/L (ref 98–111)
Creatinine, Ser: 1.13 mg/dL (ref 0.61–1.24)
GFR, Estimated: >60 mL/min (ref >60)
Glucose, Bld: 181 mg/dL — ABNORMAL HIGH (ref 70–99)
Potassium: 4.5 mmol/L (ref 3.5–5.1)
Sodium: 135 mmol/L (ref 135–145)

## 2022-02-28 LAB — GLUCOSE, CAPILLARY
Glucose-Capillary: 141 mg/dL — ABNORMAL HIGH (ref 70–99)
Glucose-Capillary: 96 mg/dL (ref 70–99)
Glucose-Capillary: 141 mg/dL — ABNORMAL HIGH (ref 70–99)
Glucose-Capillary: 117 mg/dL — ABNORMAL HIGH (ref 70–99)

## 2022-02-28 LAB — CBC
HCT: 32.8 % — ABNORMAL LOW (ref 39.0–52.0)
Hemoglobin: 10.7 g/dL — ABNORMAL LOW (ref 13.0–17.0)
MCH: 28.7 pg (ref 26.0–34.0)
MCHC: 32.6 g/dL (ref 30.0–36.0)
MCV: 87.9 fL (ref 80.0–100.0)
Platelets: 262 10*3/uL (ref 150–400)
RBC: 3.73 MIL/uL — ABNORMAL LOW (ref 4.22–5.81)
RDW: 13.2 % (ref 11.5–15.5)
WBC: 6.1 10*3/uL (ref 4.0–10.5)
nRBC: 0 % (ref 0.0–0.2)

## 2022-02-28 LAB — VITAMIN D 25 HYDROXY (VIT D DEFICIENCY, FRACTURES)

## 2022-02-28 MED ORDER — METFORMIN HCL 500 MG PO TABS
500.0000 mg | ORAL_TABLET | Freq: Two times a day (BID) | ORAL | Status: DC
  Administered 2022-02-28 – 2022-03-01 (×4): 500 mg via ORAL
  Filled 2022-02-28 (×3): qty 1

## 2022-02-28 MED ORDER — METFORMIN HCL 500 MG PO TABS: 500.0000 mg | ORAL_TABLET | Freq: Two times a day (BID) | ORAL | Status: DC

## 2022-02-28 MED ORDER — CARVEDILOL 3.125 MG PO TABS
3.1250 mg | ORAL_TABLET | Freq: Every day | ORAL | Status: DC
  Administered 2022-03-01 – 2022-03-02 (×2): 3.125 mg via ORAL
  Filled 2022-02-28 (×2): qty 1

## 2022-02-28 NOTE — Progress Notes (Signed)
Occupational Therapy Session Note  Patient Details  Name: William Acevedo MRN: [id number] Date of Birth: 04-23-1955  Today's Date: 02/28/2022 OT Individual Time: [id number] OT Individual Time Calculation (min): 43 min    Short Term Goals: Week 1:  OT Short Term Goal 1 (Week 1): Patient will integrate R UE into bathing tasks without cues OT Short Term Goal 2 (Week 1): Patient will tolerate standing for 5 minutes in preparation for BADL task OT Short Term Goal 3 (Week 1): Patient will complete 2 steps of toileting tasks.  Skilled Therapeutic Interventions/Progress Updates:   Pt open to all activity this session. Standing level orthostatic vitals after toileting with NT with VSS at 132/78. Pt with soiled top and agreeable to RW amb with CGA to retrieve pull over scrub top from dresser drawer. Sat EOB to doff soiled scrub top and don clean with S only. OT training on simple UE therex standing with beach ball for sh press, scap retraction/protraction and sh flex/ex 10 reps x 2 sets with rest. 3 lb dumb bell weight in sitting for bicep curls and punch outs. Breathing strategies throughout session. OT wrote out simple therex for carryover between sessions and for discharge planning. Issued med/heavy resistance foam cube and instructed in grip, pinch and rolling with + teach back. Pt requested back to bed at end of session as he had been up in recliner since am meal and therapy. Left bed level with all needs, nurse call button and bed exit alarm engaged.    Therapy Documentation Precautions:  Precautions Precautions: Fall Restrictions Weight Bearing Restrictions: No    Therapy/Group: Individual Therapy  Barnabas Lister 02/28/2022, 7:56 AM

## 2022-02-28 NOTE — Progress Notes (Signed)
Physical Therapy Session Note  Patient Details  Name: William Acevedo MRN: [id number] Date of Birth: 18-Jan-1955  Today's Date: 02/28/2022 PT Individual Time: 1415-1525 PT Individual Time Calculation (min): 70 min   Short Term Goals: Week 1:  PT Short Term Goal 1 (Week 1): Pt will ambulate >116f with CGA PT Short Term Goal 2 (Week 1): Pt will improve 5xSTS by >5sec to demonstrate imrpoved safety safety and reduced fall risk with functional mobility PT Short Term Goal 3 (Week 1): Pt will propell WC >1051fwith supervision assist PT Short Term Goal 4 (Week 1): Pt will perform bed balance scale to measure fall risk in fucntional setting  Skilled Therapeutic Interventions/Progress Updates:      Pt supine in bed to start - in agreement to therapy session. Flat affect throughout. Denies pain.  Supine<>sitting EOB with minA (patient reaching for assistance). Sit<>stand to RW with supervision from lowered EOB height. He ambulated with supervision and RW from his room to main rehab gym, ~15033fCues for safety awareness and increasing R step length/height.   Stair training x12, 6inch steps with 2 hand rails with supervision assist. Step-to pattern while forward facing for both ascent & descent. No LOB or knee buckling observed, regardless of which foot lead.   Gait training to ADL apartment, ~100f25fith supervision and RW - patient favors the R side of the RW but corrects well to cues. In ADL apartment, practiced walking on carpeted surfaces, furniture transfers on low sitting sofa's and rocker/recliners - all with supervision and RW. Able to demonstrate ability to safely climb in/out of as regular flat bed without physical assistance .  Gait training ~150ft82fortho rehab gym with supervision and RW - similar deficits and cues as above. Practiced car transfer with car height simulating typical sedan height - used RW and supervision assist with cues for safety approach and sequencing. Completed  via lateral stepping, relying heavily on UE support on car frame. No knee buckling on R during single leg stance.  Gait training on unlevel ramped surface with supervision and RW - cues for safety and keeping body within walker frame during descent.   Assisted onto the [facility] supervision for safety. Completed using BUE/BLE combo to challenge cardiovascular endurance and general strengthening. L5 resistance completed for 15 minutes with x1 brief rest break at the 9:30 minute mark. Patient keeps cadence > 40 steps/minute and achieves 714 steps in total during activity.   Pt ambulated back to his room from ortho rehab gym, >300ft,5fh supervision and RW. Mild R inattention vs apathy while ambulating, running into objects on R side. Pt needing directional cues for locating his room. Bed mobility completed without assist. Pt able to reposition himself. Call bell in lap, alarm on, all needs met.   Therapy Documentation Precautions:  Precautions Precautions: Fall Restrictions Weight Bearing Restrictions: No General:      Therapy/Group: Individual Therapy  ChristAlger [email or web address], 7:43 AM

## 2022-02-28 NOTE — Progress Notes (Signed)
PROGRESS NOTE   Subjective/Complaints: Sleepy this morning Labs reviewed and are stable except for elevated CBGs, metformin dose increased Was managing his own meds prior to admission  ROS: denies pain, CP, SOB, abdominal pain   Objective:   No results found. Recent Labs    02/28/22 0832  WBC 6.1  HGB 10.7*  HCT 32.8*  PLT 262   Recent Labs    02/28/22 0832  NA 135  K 4.5  CL 100  CO2 27  GLUCOSE 181*  BUN 28*  CREATININE 1.13  CALCIUM 8.7*    Intake/Output Summary (Last 24 hours) at 02/28/2022 1100 Last data filed at 02/28/2022 0753 Gross per 24 hour  Intake 1080 ml  Output --  Net 1080 ml        Physical Exam: Vital Signs Blood pressure 102/66, pulse 85, temperature 98.1 F (36.7 C), temperature source Oral, resp. rate 16, weight 48.5 kg, SpO2 98 %. Gen: no distress, normal appearing seen at bedside, appears comfortable, BMI 17.26 HEENT: oral mucosa pink and moist, NCAT Cardio: Reg rate Chest: CTAB, normal effort, normal rate of breathing Abd: soft, non-distended, positive bowel sounds Ext: no edema Psych: flat affect Skin: intact Genitourinary:    Comments: Condom catheter with medium amber urine Musculoskeletal:     Cervical back: Neck supple. No tenderness.     Comments: RUE 4+/5 in biceps, triceps, WE< grip and FA 4/5 LUE 5/5 in same muscles RLE- HF 4-/5; otherwise KE/DF and PF 4+/5 LLE 5/5 in same muscles   Skin:    General: Skin is warm and dry.  Neurological:     Mental Status: He is alert.     Sensory: Sensory deficit present.     Coordination: Coordination abnormal.     Comments: Sleeping this AM but wakes to voice. Oriented to self, place as hospital (unable to state name) or situation. Polite with limited verbal output. He was able to follow simple motor commands.    Aphasic; at word level- some word substitutions Decreased fine motor movement on RUE/hand Decreased to light  touch in LUE; otherwise normal/intact in RUE and B/L LE's     Assessment/Plan: 1. Functional deficits which require 3+ hours per day of interdisciplinary therapy in a comprehensive inpatient rehab setting. Physiatrist is providing close team supervision and 24 hour management of active medical problems listed below. Physiatrist and rehab team continue to assess barriers to discharge/monitor patient progress toward functional and medical goals  Care Tool:  Bathing    Body parts bathed by patient: Right arm, Left arm, Abdomen, Chest, Buttocks, Right upper leg, Front perineal area, Left upper leg, Face   Body parts bathed by helper: Right lower leg, Left lower leg     Bathing assist Assist Level: Minimal Assistance - Patient > 75%     Upper Body Dressing/Undressing Upper body dressing   What is the patient wearing?: Pull over shirt    Upper body assist Assist Level: Minimal Assistance - Patient > 75%    Lower Body Dressing/Undressing Lower body dressing      What is the patient wearing?: Pants, Incontinence brief     Lower body assist Assist for lower body  dressing: Moderate Assistance - Patient 50 - 74%     Toileting Toileting    Toileting assist Assist for toileting: Moderate Assistance - Patient 50 - 74%     Transfers Chair/bed transfer  Transfers assist     Chair/bed transfer assist level: Minimal Assistance - Patient > 75%     Locomotion Ambulation   Ambulation assist      Assist level: Contact Guard/Touching assist Assistive device: Walker-rolling Max distance: 14f   Walk 10 feet activity   Assist     Assist level: Contact Guard/Touching assist Assistive device: Walker-rolling   Walk 50 feet activity   Assist    Assist level: Contact Guard/Touching assist Assistive device: Walker-rolling    Walk 150 feet activity   Assist    Assist level: Contact Guard/Touching assist Assistive device: Walker-rolling    Walk 10 feet on  uneven surface  activity   Assist     Assist level: Moderate Assistance - Patient - 50 - 74%     Wheelchair     Assist Is the patient using a wheelchair?: Yes Type of Wheelchair: Manual    Wheelchair assist level: Minimal Assistance - Patient > 75% Max wheelchair distance: 50    Wheelchair 50 feet with 2 turns activity    Assist        Assist Level: Minimal Assistance - Patient > 75%   Wheelchair 150 feet activity     Assist      Assist Level: Moderate Assistance - Patient 50 - 74%   Blood pressure 102/66, pulse 85, temperature 98.1 F (36.7 C), temperature source Oral, resp. rate 16, weight 48.5 kg, SpO2 98 %.  Medical Problem List and Plan: 1. Functional deficits secondary to New [place] ACA/PCA watershed strokes with R hemiparesis and aphasia             -patient may  shower             -ELOS/Goals: 12-14 days supervision to min A PT and OT; and min-mod A for SLP  -Continue CIR, PT OT SLP 2.  Antithrombotics: -DVT/anticoagulation:  Pharmaceutical: Lovenox             -antiplatelet therapy: DAPT X 3 weeks followed by Plavix alone.  3. Pain Management: Tylenol prn.  4. Mood/Behavior/Sleep: LCSW to follow for evaluation and suppor.t              -antipsychotic agents:  Zyprexa/hs 5. Neuropsych/cognition: This patient is not capable of making decisions on his own behalf. 6. Skin/Wound Care: Routine pressure relief measures.  7. Fluids/Electrolytes/Nutrition: Monitor I/O. Nephro TID for nutritional supplement.  --Check CMET in am.  8. Hypotension/HTN: Monitor BP TID--IVF d/c yesterday --will set hold parameters for Toprol XL -2/11 elevated this AM but has been controlled, continue to monitor trend    02/28/2022    6:04 AM 02/28/2022    6:01 AM 02/27/2022    7:17 PM  Vitals with BMI  Systolic [id number] Diastolic 66 66 53  Pulse 85 85 82    9. T2DM: Hgb A1c-10.6 and poorly controlled. Was on insulin at home per chart --continue insulin glargine 15  units BID (increased to bid on 02/08) --on Farxiga--continue to hold glucotrol Add metformin 5079mdaily. D/c Nepro             --Monitor BS ac/hs and use SSI for elevated BS.   2/10 decrease semglee to to 12u BID for hypoglycemic event  2/11 discussed with pharmacy- CBG has  been elevated, will change from sensitive to moderate SSI scale 10. Neuropathy: On Cymbalta and Neurontin BID.  11. Chronic abdominal pain/GERD: Continue Pepcid and Protonix.  12. Schizoaffective disorder and PTSD: Continue Zyprex, trazodone and cymbalta.   2/10 Psychiatry visit today, rec continue current medications, psych signed out  Appears stable continue current medications 13. Elevated lipids: Chol-291 w/LDL-217. Continue Lipitor.  13. H/o ETOH/cocaine/polysubstance use: was (+) for cocaine and Marijuana and EtOH-Has completed CIWA protocol. Metoprolol changed to coreg due to risks of hypotension with history of cocaine use. Continue folic acid supplement.  14. Underweight: encourage high protein diet.  15. Screening for vitamin D deficiency: f/u vitamin D level 16. Constipation: continue sorbitol and miralax, messaged nursing to determine when last BM was 17.  Low albumin  -Encourage protein intake 18. Hypotension: decrease coreg to 3.181m HS  LOS: 4 days A FACE TO FACE EVALUATION WAS PERFORMED  Yarexi Pawlicki P Jadaya Sommerfield 02/28/2022, 11:00 AM

## 2022-02-28 NOTE — Progress Notes (Signed)
Patient ID: William Acevedo, male   DOB: [birth year], 67 y.o.   MRN: [id number] Met with the patient to review current situation, rehab process, team conference and plan of care. Patient answers questions and carries conversation with short/to the point answers. Noted he was managing medications PTA. Discussed multi medications for secondary risks including insulin for T2DM and DOAC for 3 weeks then Plavix solo. Discussed dietary modification recommendations. Patient reports understanding and no questions at present; continue to follow along to address educational needs to facilitate preparation for discharge. William Acevedo

## 2022-02-28 NOTE — Progress Notes (Signed)
Occupational Therapy Session Note  Patient Details  Name: William Acevedo MRN: [id number] Date of Birth: [birth year]  Today's Date: 02/28/2022 OT Individual Time: 0945-1100 OT Individual Time Calculation (min): 75 min    Short Term Goals: Week 1:  OT Short Term Goal 1 (Week 1): Patient will integrate R UE into bathing tasks without cues OT Short Term Goal 2 (Week 1): Patient will tolerate standing for 5 minutes in preparation for BADL task OT Short Term Goal 3 (Week 1): Patient will complete 2 steps of toileting tasks.  Skilled Therapeutic Interventions/Progress Updates: Patient received sitting up in bed. Agreeable to OT interventions. Motivated to get in the shower for am ADL training. Patient ambulated to bathroom with CGA using the RW. Sat on tub bench for bathing tasks - SBA/ Supervision for safety. Followed with dressing tasks- patient ambulated back the the EOB to dress with only SBA and assist with brief. Patient reports only wanting the brief due to not having clothing from home and is not having any difficulty with B& B function and getting to the bathroom in time. Patient performed grooming tasks standing with the walker at the sink. Good balance displayed. Stood x 5 minutes for G/H tasks. Patient needing increased time to respond to questions from therapist, but able to answer orientation and home set up questions. Patient progressing well towards meeting all STG's. Continue with skilled OT POC to achieve LTG's for safe discharge.     Therapy Documentation Precautions:  Precautions Precautions: Fall Restrictions Weight Bearing Restrictions: No General:   Vital Signs: Therapy Vitals Temp: 98.1 F (36.7 C) Temp Source: Oral Pulse Rate: 85 Resp: 16 BP: 102/66 Patient Position (if appropriate): Lying Oxygen Therapy SpO2: 98 % O2 Device: Room Air Patient Activity (if Appropriate): In bed Pulse Oximetry Type: Intermittent Pain:No report of pain during therapy      Balance- Fair + with RW for support    Therapy/Group: Individual Therapy  Hermina Barters 02/28/2022, 11:49 AM

## 2022-03-01 DIAGNOSIS — I6389 Other cerebral infarction: Secondary | ICD-10-CM | POA: Diagnosis not present

## 2022-03-01 DIAGNOSIS — F250 Schizoaffective disorder, bipolar type: Secondary | ICD-10-CM

## 2022-03-01 LAB — GLUCOSE, CAPILLARY
Glucose-Capillary: 70 mg/dL (ref 70–99)
Glucose-Capillary: 104 mg/dL — ABNORMAL HIGH (ref 70–99)
Glucose-Capillary: 170 mg/dL — ABNORMAL HIGH (ref 70–99)
Glucose-Capillary: 168 mg/dL — ABNORMAL HIGH (ref 70–99)
Glucose-Capillary: 208 mg/dL — ABNORMAL HIGH (ref 70–99)

## 2022-03-01 MED ORDER — METFORMIN HCL 850 MG PO TABS
850.0000 mg | ORAL_TABLET | Freq: Two times a day (BID) | ORAL | Status: DC
  Administered 2022-03-02 – 2022-03-03 (×4): 850 mg via ORAL
  Filled 2022-03-01 (×4): qty 1

## 2022-03-01 MED ORDER — L-METHYLFOLATE-B6-B12 3-35-2 MG PO TABS
1.0000 | ORAL_TABLET | Freq: Every day | ORAL | Status: DC
  Administered 2022-03-01 – 2022-03-04 (×4): 1 via ORAL
  Filled 2022-03-01 (×4): qty 1

## 2022-03-01 MED ORDER — MAGNESIUM GLUCONATE 500 MG PO TABS
250.0000 mg | ORAL_TABLET | Freq: Every day | ORAL | Status: DC
  Administered 2022-03-01 – 2022-03-02 (×2): 250 mg via ORAL
  Filled 2022-03-01 (×2): qty 1

## 2022-03-01 NOTE — Progress Notes (Signed)
Physical Therapy Session Note  Patient Details  Name: William Acevedo MRN: [id number] Date of Birth: 1955/10/20  Today's Date: 03/01/2022 PT Individual Time: [id number] + [id number] PT Individual Time Calculation (min): 42 min  + 43 min  Short Term Goals: Week 1:  PT Short Term Goal 1 (Week 1): Pt will ambulate >144f with CGA PT Short Term Goal 2 (Week 1): Pt will improve 5xSTS by >5sec to demonstrate imrpoved safety safety and reduced fall risk with functional mobility PT Short Term Goal 3 (Week 1): Pt will propell WC >1063fwith supervision assist PT Short Term Goal 4 (Week 1): Pt will perform bed balance scale to measure fall risk in fucntional setting  Skilled Therapeutic Interventions/Progress Updates:      1st session: Pt in bed to start - in agreement to therapy session without reports of pain. Bed mobility completed with supervision. Pt asking if he has personal clothes from home in his room - unable to locate any other than sweatpants - pt in disposable pants/shirt for session.  To challenge balance, gait, and stability, session completed without RW or AD, although recommend RW at DC and patient in agreement.  Ambulated with CGA and no AD to day room rehab gym, ~15051fIncreased R lean and drift compared to when ambulating with RW. In rehab gym, completed 4-square step testing (not timed) to challenge forward/sideways/backward stepping over obstacles. Patient with most difficulty with R stepping and backward stepping, multiple LOB that required assist for correcting. Delayed and insufficient stepping strategies.   Forward/backward ambulationg [id number] close SBA for safety - slow speed with decreased B hip extension while stepping backwards, cautious gait and difficulty keeping straight path.   Ambulated back to his room but needing directional cues as patient attempted to walk into empty rooms that he thought was his. These types of cues were needed yesterday, too. Pt ended  session in bed - crawls into bed on all 4's to get in bed - pt reports baseline. Alarm on and call bell within reach at end of session.     2nd session: Pt resting in bed to start - in agreement to therapy session. Pt now has a telesitter in his room - nursing reports he's been getting himself OOB and urinating on the floor. Educated patient on falls risk and importance either waiting for staff or using urinal to reduce falls risk - patient voices understanding.   Bed mobility completed with supervision with hospital bed features. Sit<>stand to RW with supervision and patient ambulates with supervision and RW to day room rehab gym, ~150ft29fit speed slow, cues for keeping body within walker frame and keeping straight path as he tends to veer.   Assisted onto [place] with CGA for safety - completed 6 minutes at L20 cm/sec resistance - needed encouragement for full AROM bilaterally.   Repeated sit<>stands with arms crossed over chest - 2x10 with supervision. Patient initially relies on back of legs to support himself during transition - cues for increasing forward weight shift.  Worked on balance with tandem stance on firm ground, eyes open - patient expressing being fearful of falling, needs ++ time to obtain stance. Able to sustain for only brief periods of time, ~15-20 seconds, before losing his balance .  Gait training with no AD in rehab hallways, encouraged faster pace and increased arm swing - continues to drift R at times and some mild R inattention present. Patient wrote down on sticky note for thankful tree - handwriting unintelligible.  Returned to his room with RW and supervision, ~164f - patient needing directional cues again to locate his room. Concluded session in bed with alarm on, call bell in lap.    Therapy Documentation Precautions:  Precautions Precautions: Fall Restrictions Weight Bearing Restrictions: No General:       Therapy/Group: Individual  Therapy  Lache Dagher P Rayette Mogg PT 03/01/2022, 7:33 AM
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
## 2022-03-01 NOTE — Progress Notes (Signed)
PROGRESS NOTE   Subjective/Complaints: No new complaints this morning Affect is much brighter Has eaten all of lunch Patient's chart reviewed- No issues reported overnight  ROS: denies pain, CP, SOB, abdominal pain   Objective:   No results found. Recent Labs    02/28/22 0832  WBC 6.1  HGB 10.7*  HCT 32.8*  PLT 262   Recent Labs    02/28/22 0832  NA 135  K 4.5  CL 100  CO2 27  GLUCOSE 181*  BUN 28*  CREATININE 1.13  CALCIUM 8.7*    Intake/Output Summary (Last 24 hours) at 03/01/2022 1256 Last data filed at 03/01/2022 [id number] Gross per 24 hour  Intake 897 ml  Output --  Net 897 ml        Physical Exam: Vital Signs Blood pressure (!) 169/81, pulse 84, temperature 97.8 F (36.6 C), resp. rate 18, weight 48.5 kg, SpO2 100 %. Gen: no distress, normal appearing seen at bedside, appears comfortable, BMI 17.26 HEENT: oral mucosa pink and moist, NCAT Cardio: Reg rate Chest: CTAB, normal effort, normal rate of breathing Abd: soft, non-distended, positive bowel sounds Ext: no edema Psych: affect is much brighter Skin: intact Genitourinary:    Comments: Condom catheter with medium amber urine Musculoskeletal:     Cervical back: Neck supple. No tenderness.     Comments: RUE 4+/5 in biceps, triceps, WE< grip and FA 4/5 LUE 5/5 in same muscles RLE- HF 4-/5; otherwise KE/DF and PF 4+/5 LLE 5/5 in same muscles   Skin:    General: Skin is warm and dry.  Neurological:     Mental Status: He is alert.     Sensory: Sensory deficit present.     Coordination: Coordination abnormal.     Comments: Sleeping this AM but wakes to voice. Oriented to self, place as hospital (unable to state name) or situation. Polite with limited verbal output. He was able to follow simple motor commands.    Aphasic; at word level- some word substitutions Decreased fine motor movement on RUE/hand Decreased to light touch in LUE;  otherwise normal/intact in RUE and B/L LE's     Assessment/Plan: 1. Functional deficits which require 3+ hours per day of interdisciplinary therapy in a comprehensive inpatient rehab setting. Physiatrist is providing close team supervision and 24 hour management of active medical problems listed below. Physiatrist and rehab team continue to assess barriers to discharge/monitor patient progress toward functional and medical goals  Care Tool:  Bathing    Body parts bathed by patient: Right arm, Right lower leg, Left arm, Left lower leg, Chest, Face, Abdomen, Front perineal area, Buttocks, Right upper leg, Left upper leg   Body parts bathed by helper: Right lower leg, Left lower leg     Bathing assist Assist Level: Supervision/Verbal cueing     Upper Body Dressing/Undressing Upper body dressing   What is the patient wearing?: Pull over shirt    Upper body assist Assist Level: Set up assist    Lower Body Dressing/Undressing Lower body dressing      What is the patient wearing?: Pants, Incontinence brief     Lower body assist Assist for lower body dressing: Minimal Assistance -  Patient > 75%     Toileting Toileting    Toileting assist Assist for toileting: Moderate Assistance - Patient 50 - 74%     Transfers Chair/bed transfer  Transfers assist     Chair/bed transfer assist level: Minimal Assistance - Patient > 75%     Locomotion Ambulation   Ambulation assist      Assist level: Contact Guard/Touching assist Assistive device: Walker-rolling Max distance: 168f   Walk 10 feet activity   Assist     Assist level: Contact Guard/Touching assist Assistive device: Walker-rolling   Walk 50 feet activity   Assist    Assist level: Contact Guard/Touching assist Assistive device: Walker-rolling    Walk 150 feet activity   Assist    Assist level: Contact Guard/Touching assist Assistive device: Walker-rolling    Walk 10 feet on uneven surface   activity   Assist     Assist level: Moderate Assistance - Patient - 50 - 74%     Wheelchair     Assist Is the patient using a wheelchair?: Yes Type of Wheelchair: Manual    Wheelchair assist level: Minimal Assistance - Patient > 75% Max wheelchair distance: 50    Wheelchair 50 feet with 2 turns activity    Assist        Assist Level: Minimal Assistance - Patient > 75%   Wheelchair 150 feet activity     Assist      Assist Level: Moderate Assistance - Patient 50 - 74%   Blood pressure (!) 169/81, pulse 84, temperature 97.8 F (36.6 C), resp. rate 18, weight 48.5 kg, SpO2 100 %.  Medical Problem List and Plan: 1. Functional deficits secondary to New L ACA/PCA watershed strokes with R hemiparesis and aphasia             -patient may  shower             -ELOS/Goals: 12-14 days supervision to min A PT and OT; and min-mod A for SLP  -Continue CIR, PT OT SLP  Metanx daily started 2.  Antithrombotics: -DVT/anticoagulation:  Pharmaceutical: Lovenox             -antiplatelet therapy: DAPT X 3 weeks followed by Plavix alone.  3. Pain Management: Tylenol prn.  4. Mood/Behavior/Sleep: LCSW to follow for evaluation and suppor.t              -antipsychotic agents:  Zyprexa/hs 5. Neuropsych/cognition: This patient is not capable of making decisions on his own behalf. 6. Skin/Wound Care: Routine pressure relief measures.  7. Fluids/Electrolytes/Nutrition: Monitor I/O. Nephro TID for nutritional supplement.  --Check CMET in am.  8. Hypotension/HTN: Monitor BP TID--IVF d/c yesterday --will set hold parameters for Toprol XL -2/11 elevated this AM but has been controlled, continue to monitor trend    03/01/2022    5:40 AM 02/28/2022    8:02 PM 02/28/2022    1:05 PM  Vitals with BMI  Systolic [id number] Diastolic 81 63 82  Pulse 84 79 77    9. T2DM: Hgb A1c-10.6 and poorly controlled. Was on insulin at home per chart --continue insulin glargine 15 units BID  (increased to bid on 02/08) --on Farxiga--continue to hold glucotrol Add metformin 5030mdaily. D/c Nepro             --Monitor BS ac/hs and use SSI for elevated BS.   2/10 decrease semglee to to 12u BID for hypoglycemic event  2/11 discussed with pharmacy- CBG has been elevated, will  change from sensitive to moderate SSI scale  Add magnesium gluconate 286m HS 10. Neuropathy: On Cymbalta and Neurontin BID.  11. Chronic abdominal pain/GERD: Continue Pepcid and Protonix.  12. Schizoaffective disorder and PTSD: Continue Zyprex, trazodone and cymbalta.   2/10 Psychiatry visit today, rec continue current medications, psych signed out  Appears stable continue current medications 13. Elevated lipids: Chol-291 w/LDL-217. Continue Lipitor.  13. H/o ETOH/cocaine/polysubstance use: was (+) for cocaine and Marijuana and EtOH-Has completed CIWA protocol. Metoprolol changed to coreg due to risks of hypotension with history of cocaine use. Folic acid replaced with Metanx 14. Underweight: encourage high protein diet.  15. Screening for vitamin D deficiency: f/u vitamin D level 16. Constipation: continue sorbitol and miralax, messaged nursing to determine when last BM was. Add magnesium gluconate 2567mHS 17.  Low albumin  -Encourage protein intake 18. Hypotension/hypertension fluctuating: decrease coreg to 3.12564mS. Add magnesium gluconate 250m1m  LOS: 5 days A FACE TO FACE EVALUATION WAS PERFORMED  KrutClide Deutscherlkar 03/01/2022, 12:56 PM

## 2022-03-01 NOTE — Progress Notes (Signed)
Occupational Therapy Session Note  Patient Details  Name: William Acevedo MRN: [id number] Date of Birth: 1955/10/13  Today's Date: 03/01/2022 OT Individual Time: 0845-1000 OT Individual Time Calculation (min): 75 min    Short Term Goals: Week 1:  OT Short Term Goal 1 (Week 1): Patient will integrate R UE into bathing tasks without cues OT Short Term Goal 2 (Week 1): Patient will tolerate standing for 5 minutes in preparation for BADL task OT Short Term Goal 3 (Week 1): Patient will complete 2 steps of toileting tasks.  Skilled Therapeutic Interventions/Progress Updates:   No pain reported upon OT arrival.  Full am self care routine including shower, toileting, dressing, standing for grooming as well as TTB vs shower chair trng and simple snack item retrieval amb level in demo kitchen space. Approaching goal level with CGA/S overall for full shower, S for dressing and close S for standing balance sink side after 5 min or more. VSR post shower standing 6 min sink side 155/91, HR 88, RR 18 and SpO2 100% Transported w/c level for time mngt to demo apt space for TTB vs shower chair trng. Required CGA/S on and off TTB but CGA/min A for SLS in and out of tub shower/. Simple amb in kitchen CGA/S when turining in smaller spaces.Transported back to room and amb with RW with S into recliner. Pt with pausity of conversation but does answer when questioned about social history and hobbies etc. Required min cues to use posted info for correct date otherwise oriented to hospital and reason for admission. Secure chat with team conducted and Family Trng rec and 24 hr S will be needed upon d/c home with [place].   Left with chair exity alarm engaged, all needs and nurse call button in reach.   Therapy Documentation Precautions:  Precautions Precautions: Fall Restrictions Weight Bearing Restrictions: No    Therapy/Group: Individual Therapy  Barnabas Lister 03/01/2022, 7:41 AM

## 2022-03-01 NOTE — Progress Notes (Signed)
Physical Therapy Session Note  Patient Details  Name: William Acevedo MRN: [id number] Date of Birth: 04/01/1955  Today's Date: 03/01/2022 PT Individual Time: 1452-1533 PT Individual Time Calculation (min): 41 min   Short Term Goals: Week 1:  PT Short Term Goal 1 (Week 1): Pt will ambulate >126f with CGA PT Short Term Goal 2 (Week 1): Pt will improve 5xSTS by >5sec to demonstrate imrpoved safety safety and reduced fall risk with functional mobility PT Short Term Goal 3 (Week 1): Pt will propell WC >1034fwith supervision assist PT Short Term Goal 4 (Week 1): Pt will perform bed balance scale to measure fall risk in fucntional setting  Skilled Therapeutic Interventions/Progress Updates: Pt session begun late 2/2 psychotherapist session ran over.  Pt presents supine in bed and agreeable to therapy.  Pt reaches for external assist w/ sup to sit transfer.  Pt sat EOB for w/c to be positioned and then sit to stand w/ CGA and min A for safe transfer bed> w/c.  Pt wheeled to dayroom for time conservation.  Pt transferred w/c > Nu-step w/ min A, cues for slow controlled descent.  Pt performed x 5' at level 5 for reciprocal movement of extremities.  Pt then states need to use BR.  Pt transferred nu-step> w/c w/ min A.  Pt returned to room.  Pt transferred sit to stand w/ CGA and amb w/ min A into BR.  Pt was incontinent of bladder in brief and then completed in toilet (pt stated need for Number 2 but when turning around, incontinent of urine).  [place] to chart.  PT doffed brief and scrub pants and pt given wet wash cloth and cleaned legs.  Brief adjusted in standing using hand rail, and then p pulled pants over hips.  Pt amb to sink and performed hand washing, although reaching for toothpaste and small cups, cued for hand washing w/ soap.  Pt amb x 150' x 2 w/ HHA and min A, cues for attention to objects on right.  Pt returned to bed w/ CGA.  Bed alarm on and all needs in reach.     Therapy  Documentation Precautions:  Precautions Precautions: Fall Restrictions Weight Bearing Restrictions: No General:   Vital Signs: Therapy Vitals Temp: 98.5 F (36.9 C) Pulse Rate: 84 Resp: 17 BP: 122/70 Patient Position (if appropriate): Lying Oxygen Therapy SpO2: 99 % O2 Device: Room Air Pain:0/10      Therapy/Group: Individual Therapy  [name]/13/2024, 3:47 PM

## 2022-03-01 NOTE — Progress Notes (Signed)
Patient insists on getting up without assistance. Patient presents unsteady gait while assisting to the bathroom and became agitated when nurse asked to assist so he doesn't fall. Patient states, "I am not going to pee with you in here. I can hold myself up with my left hand."   Nurse assisted patient back to the bed. Bed low, call bells within reach, bed alarm on. Patient educated. Tele ordered for safety purposes.

## 2022-03-02 DIAGNOSIS — I6389 Other cerebral infarction: Secondary | ICD-10-CM | POA: Diagnosis not present

## 2022-03-02 LAB — BASIC METABOLIC PANEL
Anion gap: 11 (ref 5–15)
BUN: 28 mg/dL — ABNORMAL HIGH (ref 8–23)
CO2: 28 mmol/L (ref 22–32)
Calcium: 9.3 mg/dL (ref 8.9–10.3)
Chloride: 98 mmol/L (ref 98–111)
Creatinine, Ser: 1 mg/dL (ref 0.61–1.24)
GFR, Estimated: >60 mL/min (ref >60)
Glucose, Bld: 92 mg/dL (ref 70–99)
Potassium: 4.7 mmol/L (ref 3.5–5.1)
Sodium: 137 mmol/L (ref 135–145)

## 2022-03-02 LAB — GLUCOSE, CAPILLARY
Glucose-Capillary: 62 mg/dL — ABNORMAL LOW (ref 70–99)
Glucose-Capillary: 223 mg/dL — ABNORMAL HIGH (ref 70–99)
Glucose-Capillary: 67 mg/dL — ABNORMAL LOW (ref 70–99)
Glucose-Capillary: 122 mg/dL — ABNORMAL HIGH (ref 70–99)
Glucose-Capillary: 66 mg/dL — ABNORMAL LOW (ref 70–99)
Glucose-Capillary: 101 mg/dL — ABNORMAL HIGH (ref 70–99)
Glucose-Capillary: 173 mg/dL — ABNORMAL HIGH (ref 70–99)

## 2022-03-02 NOTE — Progress Notes (Signed)
Physical Therapy Session Note  Patient Details  Name: William Acevedo MRN: [id number] Date of Birth: 10/02/1955  Today's Date: 03/02/2022 PT Individual Time: 1013-1108 PT Individual Time Calculation (min): 55 min   Short Term Goals: Week 1:  PT Short Term Goal 1 (Week 1): Pt will ambulate >153f with CGA PT Short Term Goal 2 (Week 1): Pt will improve 5xSTS by >5sec to demonstrate imrpoved safety safety and reduced fall risk with functional mobility PT Short Term Goal 3 (Week 1): Pt will propell WC >1066fwith supervision assist PT Short Term Goal 4 (Week 1): Pt will perform bed balance scale to measure fall risk in fucntional setting  Skilled Therapeutic Interventions/Progress Updates:    Chart reviewed and pt agreeable to therapy. Pt received semi-reclined in bed with no c/o pain. Session focused on activity tolerance and ambulation endurance to promote home and community access. Pt initiated session with ~15042fmb to therapy gym using CGA + no AD. Pt then completed NuStep at easy-med-hard intervals (workloads 4-8) for 10 mins. Pt then ambulated to main gym with CGA and completed 12 steps with B rails and CGA. Pt requested seated rest break. Pt then amb 15 mins to [place] and back to room with CGA. While in hallway, pt stood with supervision and named items outside. In room, pt discussed need to improve amb safety in larger and larger crowds, and PT agreed. At end of session, pt was left semi-reclined in bed with alarm engaged, nurse call bell and all needs in reach.     Therapy Documentation Precautions:  Precautions Precautions: Fall Restrictions Weight Bearing Restrictions: No    Therapy/Group: Individual Therapy  KirMarquette OldT, DPT 03/02/2022, 11:28 AM

## 2022-03-02 NOTE — Progress Notes (Signed)
NT reported Patient c asymptomatic hypoglydemic event of 56: RN received misinformation/misunderstood incorrect room number. Rechecked BS once mistake found and BS was 101;no treatment required.Patient stable

## 2022-03-02 NOTE — Progress Notes (Signed)
Pt refused to allow nursing to administer lovenox shot, pt educated and no concerns at this time.

## 2022-03-02 NOTE — Significant Event (Signed)
Hypoglycemic Event  CBG: 62  Treatment: 8 oz juice/soda  Symptoms: None  Follow-up CBG: [id number] CBG Result:67  Possible Reasons for Event: Medication regimen:    Comments/MD notified:  Pt drank orange juice prior to PM CBG check, CBG 205, SSI 2 units administered. 12 units of scheduled Semglee administered at same time.  Extra 4 oz of orange juice given to pt, and breakfast given to pt. Will let oncoming nurse know.     Inette Doubrava Cheri Kearns

## 2022-03-02 NOTE — Progress Notes (Signed)
Occupational Therapy Session Note  Patient Details  Name: William Acevedo MRN: [id number] Date of Birth: 04-22-1955  Today's Date: 03/02/2022 OT Individual Time: [id number] OT Individual Time Calculation (min): 71 min    Short Term Goals: Week 1:  OT Short Term Goal 1 (Week 1): Patient will integrate R UE into bathing tasks without cues OT Short Term Goal 2 (Week 1): Patient will tolerate standing for 5 minutes in preparation for BADL task OT Short Term Goal 3 (Week 1): Patient will complete 2 steps of toileting tasks.  Skilled Therapeutic Interventions/Progress Updates:   Pt greeted supine in bed, pt agreeable to OT intervention. Session focus on BADL reeducation, functional mobility, dynamic standing balance, RUE coordination and decreasing overall caregiver burden.                 Pt completed supine>sit with CGA with MIN cues for hand placement. Pt completed dressing from EOB able to don new gown with set- up assist and CGA for LB dressing to don pants. Pt completed functional ambulation to sink with MIN HHA. Pt stood for oral care with supervision with great integration of BUEs into functional task.     Pt transported to gym in w/c with total A for time mgmt. Pt completed below [facility] assessments to further assess [facility] for ADLS:   9 Hole Peg Test is used to measure finger dexterity in pts with various neurological diagnoses. - Instructions The pt was instructed to pick up the pegs one at a time, using their dominant hand first and put them into the holes in any order until the holes were all filled. The pt then removed the pegs one at a time and returned them to the container. Both hands were tested separately.  - Results The pt completed the test in RUE  1 min and 37 seconds, LUE- 1 min 13 secs. Scores are based on the time taken to complete the activity. The timer started the moment the pt touched the first peg until the moment the last peg hit the container.  - Norms for  healthy males ages 63-70+ 22-55 R 18.9 L 19.84 56-60 R 20.90 L 21.64 61-65 R 20.87 L 21.60 66-70 R 21.23 L 22.29 71+ R 25.79 L 25.95  Box and Blocks Test measures unilateral gross manual dexterity. - Instructions The pt was instructed to carry one block over at a time and go as quickly as they could, making sure their fingertips crossed the partition. One minute was given to complete the task per UE. The pt was allowed a 15-second trial period prior to testing if needed. - Results The pt transferred 23 blocks with the R hand and 31 with the L hand. The total number of blocks carried from one compartment to the other in one minute is scored per hand. Higher scores on the test indicate better gross manual dexterity.  - Norms for adults males 50-75+ -50-54 R 79 L 77.0 -55-59 R 75.2 L 73.8 -60-64 R 71.3 L 70.5 -65-69 R 68.5 L 67.4 -70-74 R 66.3 L 64.33 -75+ R 63.0 L 61.3  Pt completed dynamic standing balance task with pt instructed to use RUE to match colors of magnets to colored squares on magnet board, pt needed most assist remembering to only use RUE, graded task up and had pt stand on airex cushion to challenge balance, pt completed task with CGA with no LOB.   During task pt reported need to void b/b. Pt completed ambulatory toilet transfer  down the hall to ADL bathroom with no AD and CGA. Pt completed 3/3 toiletng tasks with CGA with pt only voiding urine.   Pt completed functional mobility task where pt instructed to ambulate down hallway to collect various colored discs on R and L side. Pt needed MAX cues to locate discs on R and L side ( R side worse than L), pt looking to ceiling for discs and seemed to have difficulty with acuity, pt does report that he wears glasses all the time but doesn't have them here, recommended pt have his family bring in prescription glasses to improve acuity.   Ended session with pt seated in recliner with all needs within reach and safety belt  alarm activated.                   Therapy Documentation Precautions:  Precautions Precautions: Fall Restrictions Weight Bearing Restrictions: No  Pain: No pain    Therapy/Group: Individual Therapy  Corinne Ports [facility] 03/02/2022, 12:16 PM

## 2022-03-02 NOTE — Progress Notes (Addendum)
Patient ID: William Acevedo, male   DOB: 1955-08-26, 67 y.o.   MRN: [id number]  Message left for brother and son to update regarding team conference and discharge date. Pt is in therapies will update once back in his room  1:13 PM Met with pt to give him the team conference update regarding goals of supervision level and discharge date 2/16. He is glad to have a date to go home. He reports he has tub seat and is agreeable to home health and has no preference regarding agency. Will work on this for Friday and await brother calling back.

## 2022-03-02 NOTE — Progress Notes (Signed)
PROGRESS NOTE   Subjective/Complaints: Hypoglycemic this AM, discussed stopping HS insulin sliding scale He has no other complaints this morning Will recheck Cr given starting metformin  ROS: denies pain, CP, SOB, abdominal pain   Objective:   No results found. Recent Labs    02/28/22 0832  WBC 6.1  HGB 10.7*  HCT 32.8*  PLT 262   Recent Labs    02/28/22 0832  NA 135  K 4.5  CL 100  CO2 27  GLUCOSE 181*  BUN 28*  CREATININE 1.13  CALCIUM 8.7*    Intake/Output Summary (Last 24 hours) at 03/02/2022 1017 Last data filed at 03/02/2022 0757 Gross per 24 hour  Intake 834 ml  Output --  Net 834 ml        Physical Exam: Vital Signs Blood pressure 108/62, pulse 76, temperature (!) 97.5 F (36.4 C), temperature source Oral, resp. rate 18, weight 48.5 kg, SpO2 100 %. Gen: no distress, normal appearing seen at bedside, appears comfortable, BMI 17.26 HEENT: oral mucosa pink and moist, NCAT, poor dentition Cardio: Reg rate Chest: CTAB, normal effort, normal rate of breathing Abd: soft, non-distended, positive bowel sounds Ext: no edema Psych: affect is much brighter Skin: intact Genitourinary:    Comments: Condom catheter with medium amber urine Musculoskeletal:     Cervical back: Neck supple. No tenderness.     Comments: RUE 4+/5 in biceps, triceps, WE< grip and FA 4/5 LUE 5/5 in same muscles RLE- HF 4-/5; otherwise KE/DF and PF 4+/5 LLE 5/5 in same muscles   Skin:    General: Skin is warm and dry.  Neurological:     Mental Status: He is alert.     Sensory: Sensory deficit present.     Coordination: Coordination abnormal.     Comments: Sleeping this AM but wakes to voice. Oriented to self, place as hospital (unable to state name) or situation. Polite with limited verbal output. He was able to follow simple motor commands.    Aphasic; at word level- some word substitutions Decreased fine motor movement  on RUE/hand Decreased to light touch in LUE; otherwise normal/intact in RUE and B/L LE's     Assessment/Plan: 1. Functional deficits which require 3+ hours per day of interdisciplinary therapy in a comprehensive inpatient rehab setting. Physiatrist is providing close team supervision and 24 hour management of active medical problems listed below. Physiatrist and rehab team continue to assess barriers to discharge/monitor patient progress toward functional and medical goals  Care Tool:  Bathing    Body parts bathed by patient: Right arm, Right lower leg, Left arm, Left lower leg, Chest, Face, Abdomen, Front perineal area, Buttocks, Right upper leg, Left upper leg   Body parts bathed by helper: Right lower leg, Left lower leg     Bathing assist Assist Level: Supervision/Verbal cueing     Upper Body Dressing/Undressing Upper body dressing   What is the patient wearing?: Pull over shirt    Upper body assist Assist Level: Set up assist    Lower Body Dressing/Undressing Lower body dressing      What is the patient wearing?: Pants, Incontinence brief     Lower body assist Assist  for lower body dressing: Minimal Assistance - Patient > 75%     Toileting Toileting    Toileting assist Assist for toileting: Moderate Assistance - Patient 50 - 74%     Transfers Chair/bed transfer  Transfers assist     Chair/bed transfer assist level: Minimal Assistance - Patient > 75%     Locomotion Ambulation   Ambulation assist      Assist level: Minimal Assistance - Patient > 75% Assistive device: Hand held assist Max distance: 150   Walk 10 feet activity   Assist     Assist level: Minimal Assistance - Patient > 75% Assistive device: Hand held assist   Walk 50 feet activity   Assist    Assist level: Minimal Assistance - Patient > 75% Assistive device: Hand held assist    Walk 150 feet activity   Assist    Assist level: Minimal Assistance - Patient >  75% Assistive device: Hand held assist    Walk 10 feet on uneven surface  activity   Assist     Assist level: Moderate Assistance - Patient - 50 - 74%     Wheelchair     Assist Is the patient using a wheelchair?: Yes Type of Wheelchair: Manual    Wheelchair assist level: Minimal Assistance - Patient > 75% Max wheelchair distance: 50    Wheelchair 50 feet with 2 turns activity    Assist        Assist Level: Minimal Assistance - Patient > 75%   Wheelchair 150 feet activity     Assist      Assist Level: Moderate Assistance - Patient 50 - 74%   Blood pressure 108/62, pulse 76, temperature (!) 97.5 F (36.4 C), temperature source Oral, resp. rate 18, weight 48.5 kg, SpO2 100 %.  Medical Problem List and Plan: 1. Functional deficits secondary to [place] ACA/PCA watershed strokes with R hemiparesis and aphasia             -patient may  shower             -ELOS/Goals: 12-14 days supervision to min A PT and OT; and min-mod A for SLP  Continue CIR, PT OT SLP  Metanx daily started  Check homocysteine level 2.  Antithrombotics: -DVT/anticoagulation:  Pharmaceutical: Lovenox             -antiplatelet therapy: DAPT X 3 weeks followed by Plavix alone.  3. Pain Management: Tylenol prn.  4. Mood/Behavior/Sleep: LCSW to follow for evaluation and suppor.t              -antipsychotic agents:  Zyprexa/hs 5. Neuropsych/cognition: This patient is not capable of making decisions on his own behalf. 6. Skin/Wound Care: Routine pressure relief measures.  7. Fluids/Electrolytes/Nutrition: Monitor I/O. Nephro TID for nutritional supplement.  --Check CMET in am.  8. Hypotension/HTN: Monitor BP TID--IVF d/c yesterday --will set hold parameters for Toprol XL -2/11 elevated this AM but has been controlled, continue to monitor trend    03/02/2022    6:30 AM 03/02/2022    5:02 AM 03/01/2022    7:41 PM  Vitals with BMI  Systolic [id number]  Diastolic 62 84 74  Pulse  76 85     9. T2DM: Hgb A1c-10.6 and poorly controlled. Was on insulin at home per chart --continue insulin glargine 15 units BID (increased to bid on 02/08) --on Farxiga--continue to hold glucotrol Add metformin 561m daily. D/c Nepro. Check creatinine since metformin started             --  Monitor BS ac/hs and use SSI for elevated BS during day only  2/10 decrease semglee to to 12u BID for hypoglycemic event  2/11 discussed with pharmacy- CBG has been elevated, will change from sensitive to moderate SSI scale  Add magnesium gluconate 231m HS 10. Neuropathy: On Cymbalta and Neurontin BID.  11. Chronic abdominal pain/GERD: Continue Pepcid and Protonix.  12. Schizoaffective disorder and PTSD: Continue Zyprex, trazodone and cymbalta.   2/10 Psychiatry visit today, rec continue current medications, psych signed out  Appears stable continue current medications 13. Elevated lipids: Chol-291 w/LDL-217. Continue Lipitor.  13. H/o ETOH/cocaine/polysubstance use: was (+) for cocaine and Marijuana and EtOH-Has completed CIWA protocol. Metoprolol changed to coreg due to risks of hypotension with history of cocaine use. Folic acid replaced with Metanx 14. Underweight: encourage high protein diet.  15. Screening for vitamin D deficiency: f/u vitamin D level 16. Constipation: continue sorbitol and miralax, messaged nursing to determine when last BM was. Add magnesium gluconate 2581mHS 17.  Low albumin  -Encourage protein intake 18. Hypotension/hypertension fluctuating: decrease coreg to 3.12585mS. Add magnesium gluconate 250m8m. Improved, continue this regimen  LOS: 6 days A FACE TO FACE EVALUATION WAS PERFORMED  KrutClide Deutscherlkar 03/02/2022, 10:17 AM

## 2022-03-02 NOTE — Progress Notes (Signed)
Physical Therapy Session Note  Patient Details  Name: William Acevedo MRN: [id number] Date of Birth: [birth year]  Today's Date: 03/02/2022 PT Individual Time: 1300-1411 PT Individual Time Calculation (min): 71 min   Short Term Goals: Week 1:  PT Short Term Goal 1 (Week 1): Pt will ambulate >165f with CGA PT Short Term Goal 2 (Week 1): Pt will improve 5xSTS by >5sec to demonstrate imrpoved safety safety and reduced fall risk with functional mobility PT Short Term Goal 3 (Week 1): Pt will propell WC >1085fwith supervision assist PT Short Term Goal 4 (Week 1): Pt will perform bed balance scale to measure fall risk in fucntional setting  Skilled Therapeutic Interventions/Progress Updates:      Pt presents supine in bed - finishing up his lunch which he ate 100% of. Patient in agreement to therapy session and denies pain.   Supine<>sitting EOB with supervision and completes sit<>stand to RW with supervision assist. Ambulates with supervision and RW to main rehab gym, ~15028f min cues for attending to R side of his environment. Completed standing task to work on R inattention, [facility] Joseelective attention, and standing tolerance. Patient requires mod/max cues for simple problem solving for tasks. Ambulated ~100f80fth supervision to day room rehab gym and participated in therapeutic activities revolving around Valentines day - completed arts/crafts to challenge cognitive thinking, problem solving, sequencing, and FMC/dexterity bilaterally. Patient requires cues and assist for managing smaller items and has difficulty with unfamiliar tasks. Completed maze test - needing max cues for problem solving. Patient's writing is intelligible but unsure of baseline ability to write.   Returned to his room and patient concluded session in bed with all needs met, alarm on    Therapy Documentation Precautions:  Precautions Precautions: Fall Restrictions Weight Bearing Restrictions: No General:        Therapy/Group: Individual Therapy  Tabatha Razzano P Adonna Horsley PT 03/02/2022, 7:40 AM

## 2022-03-03 DIAGNOSIS — I6389 Other cerebral infarction: Secondary | ICD-10-CM | POA: Diagnosis not present

## 2022-03-03 LAB — GLUCOSE, CAPILLARY
Glucose-Capillary: 52 mg/dL — ABNORMAL LOW (ref 70–99)
Glucose-Capillary: 69 mg/dL — ABNORMAL LOW (ref 70–99)
Glucose-Capillary: 130 mg/dL — ABNORMAL HIGH (ref 70–99)
Glucose-Capillary: 50 mg/dL — ABNORMAL LOW (ref 70–99)
Glucose-Capillary: 60 mg/dL — ABNORMAL LOW (ref 70–99)
Glucose-Capillary: 74 mg/dL (ref 70–99)
Glucose-Capillary: 129 mg/dL — ABNORMAL HIGH (ref 70–99)
Glucose-Capillary: 192 mg/dL — ABNORMAL HIGH (ref 70–99)
Glucose-Capillary: 157 mg/dL — ABNORMAL HIGH (ref 70–99)

## 2022-03-03 LAB — HOMOCYSTEINE: Homocysteine: 12.4 umol/L (ref 0.0–17.2)

## 2022-03-03 MED ORDER — METFORMIN HCL 500 MG PO TABS
1000.0000 mg | ORAL_TABLET | Freq: Two times a day (BID) | ORAL | Status: DC
  Administered 2022-03-04: 1000 mg via ORAL
  Filled 2022-03-03: qty 2

## 2022-03-03 MED ORDER — MAGNESIUM GLUCONATE 500 MG PO TABS
500.0000 mg | ORAL_TABLET | Freq: Every day | ORAL | Status: DC
  Filled 2022-03-03: qty 1

## 2022-03-03 MED ORDER — INSULIN GLARGINE-YFGN 100 UNIT/ML ~~LOC~~ SOLN
6.0000 [IU] | Freq: Two times a day (BID) | SUBCUTANEOUS | Status: DC
  Filled 2022-03-03 (×2): qty 0.06

## 2022-03-03 MED ORDER — INSULIN GLARGINE-YFGN 100 UNIT/ML ~~LOC~~ SOLN
5.0000 [IU] | Freq: Two times a day (BID) | SUBCUTANEOUS | Status: DC
  Administered 2022-03-03: 5 [IU] via SUBCUTANEOUS
  Filled 2022-03-03 (×3): qty 0.05

## 2022-03-03 NOTE — Progress Notes (Signed)
Physical Therapy Discharge Summary  Patient Details  Name: William Acevedo MRN: [id number] Date of Birth: May 20, 1955  Date of Discharge from PT service:March 03, 2022  Today's Date: 03/03/2022 PT Individual Time: 0846-1000 PT Individual Time Calculation (min): 74 min    Patient has met 8 of 8 long term goals due to improved activity tolerance, improved balance, improved postural control, increased strength, functional use of  right lower extremity, improved attention, improved awareness, and improved coordination.  Patient to discharge at an ambulatory level Supervision.   Patient's care partner is independent to provide the necessary physical and cognitive assistance at discharge.   Recommendation:  Patient will benefit from ongoing skilled PT services in home health setting to continue to advance safe functional mobility, address ongoing impairments in balance strategies, postural control, safety awareness, endurance , and minimize fall risk.  Equipment: RW   Reasons for discharge: treatment goals met and discharge from hospital  Patient/family agrees with progress made and goals achieved: Yes  PT Session  Pt received supine in bed and agreeable to therapy. Pt's grad day assessment conducted as noted below.  Pt ambulated to therapy gym w/ RW and supervision assist.  Pt performed bed mobility and transfers throughout session w/ supervision assist for safety purposes. Gait ~200 ft x2 w/ RW and supervision assist. Therapist observed pt veering towards the right, when asked if pt was aware of this the pt responded yes and self corrected, however, required mod vc to bring to attention. Dynamic gait training done negotiating around 5 cones ~15 ft w. RW and supervision assist. Mod vc given to pt to avoid hitting cones more so when turning to the R than L. Pt instructed to step over 4 obstacles along a 15 ft path w/ RW and supervision assist. Therapist providing mod vc for the sequencing  of movement and managing the walker to ensure pt safety. Pt returned to room independently w/ the use of RW and supervision. Left supine in bed with bed alarm on, call bell in reach and all needs met.  PT Discharge Precautions/Restrictions Precautions Precautions: Fall Precaution Comments: R inattention Restrictions Weight Bearing Restrictions: No Vital Signs Therapy Vitals Temp: 98.1 F (36.7 C) Temp Source: Oral Pulse Rate: 84 Resp: 18 BP: 105/63 Patient Position (if appropriate): Lying Oxygen Therapy SpO2: 100 % O2 Device: Room Air Pain Pain Assessment Pain Scale: 0-10 Pain Score: 0-No pain Pain Interference Pain Interference Pain Effect on Sleep: 0. Does not apply - I have not had any pain or hurting in the past 5 days Pain Interference with Therapy Activities: 1. Rarely or not at all Pain Interference with Day-to-Day Activities: 1. Rarely or not at all Vision/Perception  Vision - History Ability to See in Adequate Light: 0 Adequate  Blurring Vision Cognition Arousal/Alertness: Lethargic Orientation Level: Oriented to person;Oriented to time Awareness: Impaired Sensation Sensation Light Touch: Appears Intact Coordination Gross Motor Movements are Fluid and Coordinated: No Motor  Motor Motor: Hemiplegia Motor - Discharge Observations: Minor R Hemiplegia, decreased postural control  Mobility Bed Mobility Bed Mobility: Rolling Left;Sit to Supine;Supine to Sit Rolling Left: Independent with assistive device Supine to Sit: Independent with assistive device Sit to Supine: Independent with assistive device Transfers Transfers: Sit to Stand;Stand to Sit;Stand Pivot Transfers Sit to Stand: Supervision/Verbal cueing Stand to Sit: Supervision/Verbal cueing Stand Pivot Transfers: Supervision/Verbal cueing Stand Pivot Transfer Details: Verbal cues for safe use of DME/AE;Verbal cues for gait pattern;Verbal cues for precautions/safety Transfer (Assistive device):  Rolling walker Locomotion  Gait  Ambulation: Yes Gait Assistance: Supervision/Verbal cueing Gait Distance (Feet): 200 Feet Assistive device: Rolling walker Gait Assistance Details: Verbal cues for safe use of DME/AE;Verbal cues for precautions/safety;Verbal cues for gait pattern Gait Gait: Yes Gait Pattern: Impaired Gait Pattern: Ataxic;Step-through pattern;Trunk flexed;Lateral trunk lean to right Stairs / Additional Locomotion Stairs: Yes Stairs Assistance: Supervision/Verbal cueing Stair Management Technique: Two rails Number of Stairs: 12 Height of Stairs: 6 (4) Ramp: Supervision/Verbal cueing Curb: Supervision/Verbal cueing Pick up small object from the floor assist level: Supervision/Verbal cueing Wheelchair Mobility Wheelchair Mobility: No  Trunk/Postural Assessment  Cervical Assessment Cervical Assessment: Within Functional Limits Thoracic Assessment Thoracic Assessment: Within Functional Limits Lumbar Assessment Lumbar Assessment: Within Functional Limits Postural Control Postural Control: Deficits on evaluation Righting Reactions: Postural sway R>L  Balance Balance Balance Assessed: Yes Static Sitting Balance Static Sitting - Balance Support: Feet supported Static Sitting - Level of Assistance: 7: Independent Dynamic Sitting Balance Dynamic Sitting - Balance Support: Feet supported Dynamic Sitting - Level of Assistance: 5: Stand by assistance Static Standing Balance Static Standing - Balance Support: During functional activity Static Standing - Level of Assistance: 7: Independent Dynamic Standing Balance Dynamic Standing - Balance Support: During functional activity Dynamic Standing - Level of Assistance: 5: Stand by assistance Extremity Assessment      RLE Assessment RLE Assessment: Exceptions to [facility] RLE Strength RLE Overall Strength: Deficits Right Hip Flexion: 4-/5 Right Hip ABduction: 4-/5 Right Hip ADduction: 4/5 Right Knee Flexion: 4-/5 Right  Knee Extension: 4/5 Right Ankle Dorsiflexion: 4/5 LLE Assessment LLE Assessment: Exceptions to [facility] LLE Strength LLE Overall Strength: Deficits Left Hip Flexion: 4/5 Left Hip ABduction: 4-/5 Left Hip ADduction: 4/5 Left Knee Flexion: 4/5 Left Knee Extension: 4+/5 Left Ankle Dorsiflexion: 4/5   Helmi Hechavarria 03/03/2022, 5:40 PM

## 2022-03-03 NOTE — Patient Care Conference (Signed)
Inpatient RehabilitationTeam Conference and Plan of Care Update Date: 03/02/2022   Time: 11:48 AM    Patient Name: William Acevedo      Medical Record Number: [id number]  Date of Birth: December 17, 1955 Sex: Male         Room/Bed: 4W24C/4W24C-01 Payor Info: Payor: [employer] / Plan: [place] / Product Type: *No Product type* /    Admit Date/Time:  02/24/2022  8:47 PM  Primary Diagnosis:  Acute ischemic multifocal multiple vascular territories stroke [facility])  Hospital Problems: Principal Problem:   Acute ischemic multifocal multiple vascular territories stroke [facility]) Active Problems:   DM2 (diabetes mellitus, type 2) ([place])   Cocaine abuse ([place])   CVA (cerebral vascular accident) ([place])   Schizoaffective disorder ([place])   Cannabis dependence, continuous abuse [facility])    Expected Discharge Date: Expected Discharge Date: 03/04/22  Team Members Present: Physician leading conference: Dr. Leeroy Cha Social Worker Present: Ovidio Kin, LCSW Nurse Present: Dorien Chihuahua, RN PT Present: Ginnie Smart, PT OT Present: Other (comment) Lowella Fairy, OT) SLP Present: Helaine Chess, SLP PPS Coordinator present : Gunnar Fusi, SLP     Current Status/Progress Goal Weekly Team Focus  Bowel/Bladder   Continent/incontinent of b/b. Pt will saturate bed with urine then proceed to attempt to ambulate to bathroom without staff assitance. Has also had occurances of voiding on floor, trashcan, sink, and bathroom floor. Reorientation attempted, pt not always compliant.   Regain full continence and cease to void in/on places other than toilet.   Attempt to toilet pt on a schedule to reduce incontinent episodes.    Swallow/Nutrition/ Hydration               ADL's   also near goal level with CGA/S overall for full shower, S for dressing and close S for standing balance sink side after 5 min or more. Simple amb in kitchen CGA/S when turining in smaller spaces.    Supervision   progress dyn standing balance and safety for BADL's and bathroom transfers, DC planning    Mobility   near goal level. Supervision bed mobility, supervision bed<>chair transfers, CGA for long distance ambulation and stairs   Supervision  dynamic standing, general strengthening/endurance, safety awareness, DC planning    Communication                Safety/Cognition/ Behavioral Observations               Pain   No c/o pain at this time   Pain <3/10   Assess Qshift and prn    Skin   finger tip of right hand with diabetic ulcer. Skin injury to left lower abdomen.   Promote healing and prevention of new deterioration  Assess Qshift and prn      Discharge Planning:  Home with brother need to confimr supervision level and have come in for family training prior to DC home   Team Discussion: Patient BP fluctuates; MD adjusting medications and added magnesium. CBGs low; MD adjusting medications. Patient with cognitive impairments, cognitively dependent on brother PTA. Also presents with balance issues; cannot maintain balance > 5 min, right inattention, requires cues to scan right and needs support to navigate in small spaces.   Patient on target to meet rehab goals: Currently needs CGA - supervision for bathing and dressing and CGA for IADLs. Able to ambulate up to 200' using a RW with CGA and navigate steps with CGA. Goals for discharge set for supervision overall.  *See  Care Plan and progress notes for long and short-term goals.   Revisions to Treatment Plan:  N/A  Teaching Needs: Safety, cues, medications, transfers, toileting, etc.   Current Barriers to Discharge: Decreased caregiver support and Home enviroment access/layout  Possible Resolutions to Barriers: Family education     Medical Summary Current Status: underweight, HTN, type 2 diabetes mellitus, agitation, suicidal ideation, history of chronic sybstance abuse  Barriers to Discharge:  Hypotension;Medical stability;Behavior/Mood  Barriers to Discharge Comments: underweight, HTN, type 2 diabetes mellitus, agitation, suicidal ideation, history of chronic sybstance abuse Possible Resolutions to [employer] Focus: provide dietary education, continue to monitor blood pressure TID, decreased Coreg to HS, magnesium supplement started HS, psych consulted, metformin started   Continued Need for Acute Rehabilitation Level of Care: The patient requires daily medical management by a physician with specialized training in physical medicine and rehabilitation for the following reasons: Direction of a multidisciplinary physical rehabilitation program to maximize functional independence : Yes Medical management of patient stability for increased activity during participation in an intensive rehabilitation regime.: Yes Analysis of laboratory values and/or radiology reports with any subsequent need for medication adjustment and/or medical intervention. : Yes   I attest that I was present, lead the team conference, and concur with the assessment and plan of the team.   Dorien Chihuahua B 03/03/2022, 8:45 AM

## 2022-03-03 NOTE — Progress Notes (Signed)
Occupational Therapy Session Note  Patient Details  Name: William Acevedo MRN: [id number] Date of Birth: 09/27/1955  Today's Date: 03/03/2022 OT Individual Time: 1107-1203 OT Individual Time Calculation (min): 56 min    Short Term Goals: Week 1:  OT Short Term Goal 1 (Week 1): Patient will integrate R UE into bathing tasks without cues OT Short Term Goal 2 (Week 1): Patient will tolerate standing for 5 minutes in preparation for BADL task OT Short Term Goal 3 (Week 1): Patient will complete 2 steps of toileting tasks.  Skilled Therapeutic Interventions/Progress Updates:  Pt greeted supine in bed, pt agreeable to OT intervention. Session focus on BADL reeducation, functional mobility, dynamic standing balance and decreasing overall caregiver burden.         Pt seemed to be more lethargic/slow during session having difficulty getting his words out, completed DC assessments as indicated below with pt especially having + difficulty with cognitive section.     Pt declined need for shower but agreeable to wash up at sink. Pt completed ambulatory transfer to sink for bathing with no AD and supervision.  Pt stood for bathing tasks with overall supervision. Pt also reports need to void bladder, pt completed 3/3 toileting tasks with supervision.            Donned pants from EOB with supervision, pt declined changing gown. NT enter to assess blood sugar, blood sugar 60 therefore NT retrieved snack as this would explain why pt seems to be lethargic/ delayed. Pt able to open juices and crackers independently and made peanut butter crackers from EOB using BUEs appropriately to complete [facility] task.   Ended session with pt seated EOB with NT present with all needs within reach.                Therapy Documentation Precautions:  Precautions Precautions: Fall Precaution Comments: R inattention Restrictions Weight Bearing Restrictions: No  Pain: Pain Assessment Pain Scale: 0-10 Pain Score: 0-No  pain ADL: ADL Eating: Independent Where Assessed-Eating: Edge of bed Grooming: Supervision/safety Where Assessed-Grooming: Standing at sink Upper Body Bathing: Supervision/safety Where Assessed-Upper Body Bathing: Standing at sink Lower Body Bathing: Supervision/safety Where Assessed-Lower Body Bathing: Standing at sink Upper Body Dressing: Independent (per clinincal judgment) Where Assessed-Upper Body Dressing: Edge of bed Lower Body Dressing: Supervision/safety Where Assessed-Lower Body Dressing: Edge of bed Toileting: Supervision/safety Where Assessed-Toileting: Glass blower/designer: Distant supervision Armed forces technical officer Method: Ambulating Tub/Shower Transfer: Close supervison Tub/Shower Transfer Method: Optometrist: Gaffer Patient Visual Report: Blurring of vision;Diplopia Perception  Praxis Cognition Cognition Arousal/Alertness: Lethargic Awareness: Impaired Awareness Impairment: Anticipatory impairment Brief Interview for Mental Status (BIMS) Repetition of Three Words (First Attempt): 3 Temporal Orientation: Year: Correct Temporal Orientation: Month: Accurate within 5 days (increased tim e) Temporal Orientation: Day: Correct Recall: "Sock": Yes, after cueing ("something to wear") Recall: "Blue": No, could not recall Recall: "Bed": No, could not recall BIMS Summary Score: 10 Sensation Sensation Light Touch: Appears Intact Coordination Gross Motor Movements are Fluid and Coordinated: No Finger Nose Finger Test: minor dysmetria Heel Shin Test: ataxic and slower w/ RLE Motor  Motor Motor: Hemiplegia;Abnormal postural alignment and control Motor - Discharge Observations: R hemi - mild Mobility  Bed Mobility Bed Mobility: Sit to Supine;Supine to Sit Supine to Sit: Supervision/Verbal cueing Sit to Supine: Supervision/Verbal cueing Transfers Sit to Stand: Supervision/Verbal cueing Stand to Sit: Supervision/Verbal cueing   Trunk/Postural Assessment  Cervical Assessment Cervical Assessment: Within Functional Limits Thoracic Assessment Thoracic Assessment: Within Functional Limits  Lumbar Assessment Lumbar Assessment: Exceptions to [facility] (posterior tilt) Postural Control Postural Control: Deficits on evaluation Righting Reactions: delayed stepping strategies  Balance Extremity/Trunk Assessment RUE Assessment RUE Assessment: Exceptions to [facility] Active Range of Motion (AROM) Comments: shoulder flexion to ~ 120 * General Strength Comments: overall 4-/5 LUE Assessment LUE Assessment: Exceptions to [facility] General Strength Comments: overall 4/5  Therapy/Group: Individual Therapy  Corinne Ports [facility] 03/03/2022, 12:23 PM

## 2022-03-03 NOTE — Progress Notes (Addendum)
Patient ID: William Acevedo, male   DOB: [birth year], 67 y.o.   MRN: [id number]  Have left another message for pt's brother-William Acevedo did not hear from him yesterday. Will attempt to call son-William Acevedo again. Did reach him to give him the team conference update and goals of supervision level and discharge tomorrow. He will reach out to uncle and have him call this worker.   11:20 Am Spoke with William Acevedo-brother who called this worker and was able to give team conference update regarding supervision level goals and discharge set for tomorrow. He had medical questions regarding diabetes have messaged Pam-PA and MD for one of them to call brother. Aware discharge time is between 10-11 am. Informed home health arranged via [facility]. No equipment needs, has from previous admissions

## 2022-03-03 NOTE — Progress Notes (Signed)
PROGRESS NOTE   Subjective/Complaints: Discussed his blood sugars with his brother Son says his mood worsens when his diabetes is uncontrolled. His brother does the cooking.   ROS: denies pain, CP, SOB, abdominal pain   Objective:   No results found. No results for input(s): "WBC", "HGB", "HCT", "PLT" in the last 72 hours.  Recent Labs    03/02/22 1130  NA 137  K 4.7  CL 98  CO2 28  GLUCOSE 92  BUN 28*  CREATININE 1.00  CALCIUM 9.3    Intake/Output Summary (Last 24 hours) at 03/03/2022 1126 Last data filed at 03/03/2022 0743 Gross per 24 hour  Intake 840 ml  Output --  Net 840 ml        Physical Exam: Vital Signs Blood pressure (!) 100/53, pulse 72, temperature 97.9 F (36.6 C), temperature source Oral, resp. rate 15, weight 48.5 kg, SpO2 100 %. Gen: no distress, normal appearing seen at bedside, appears comfortable, BMI 17.26 HEENT: oral mucosa pink and moist, NCAT, poor dentition Cardio: Reg rate Chest: CTAB, normal effort, normal rate of breathing Abd: soft, non-distended, positive bowel sounds Ext: no edema Psych: affect is much brighter Skin: intact Genitourinary:    Comments: Condom catheter with medium amber urine Musculoskeletal:     Cervical back: Neck supple. No tenderness.     Comments: RUE 4+/5 in biceps, triceps, WE< grip and FA 4/5 LUE 5/5 in same muscles RLE- HF 4-/5; otherwise KE/DF and PF 4+/5 LLE 5/5 in same muscles   Skin:    General: Skin is warm and dry.  Neurological:     Mental Status: He is alert.     Sensory: Sensory deficit present.     Coordination: Coordination abnormal.     Comments: Sleeping this AM but wakes to voice. Oriented to self, place as hospital (unable to state name) or situation. Polite with limited verbal output. He was able to follow simple motor commands.    Aphasic; at word level- some word substitutions Decreased fine motor movement on  RUE/hand Decreased to light touch in LUE; otherwise normal/intact in RUE and B/L LE's   Ambulating CG   Assessment/Plan: 1. Functional deficits which require 3+ hours per day of interdisciplinary therapy in a comprehensive inpatient rehab setting. Physiatrist is providing close team supervision and 24 hour management of active medical problems listed below. Physiatrist and rehab team continue to assess barriers to discharge/monitor patient progress toward functional and medical goals  Care Tool:  Bathing    Body parts bathed by patient: Right arm, Right lower leg, Left arm, Left lower leg, Chest, Face, Abdomen, Front perineal area, Buttocks, Right upper leg, Left upper leg   Body parts bathed by helper: Right lower leg, Left lower leg     Bathing assist Assist Level: Supervision/Verbal cueing     Upper Body Dressing/Undressing Upper body dressing   What is the patient wearing?: Hospital gown only    Upper body assist Assist Level: Set up assist    Lower Body Dressing/Undressing Lower body dressing      What is the patient wearing?: Pants     Lower body assist Assist for lower body dressing: Contact Guard/Touching  assist     Toileting Toileting    Toileting assist Assist for toileting: Contact Guard/Touching assist     Transfers Chair/bed transfer  Transfers assist     Chair/bed transfer assist level: Contact Guard/Touching assist     Locomotion Ambulation   Ambulation assist      Assist level: Minimal Assistance - Patient > 75% Assistive device: Hand held assist Max distance: 150   Walk 10 feet activity   Assist     Assist level: Minimal Assistance - Patient > 75% Assistive device: Hand held assist   Walk 50 feet activity   Assist    Assist level: Minimal Assistance - Patient > 75% Assistive device: Hand held assist    Walk 150 feet activity   Assist    Assist level: Minimal Assistance - Patient > 75% Assistive device: Hand  held assist    Walk 10 feet on uneven surface  activity   Assist     Assist level: Moderate Assistance - Patient - 50 - 74%     Wheelchair     Assist Is the patient using a wheelchair?: Yes Type of Wheelchair: Manual    Wheelchair assist level: Minimal Assistance - Patient > 75% Max wheelchair distance: 50    Wheelchair 50 feet with 2 turns activity    Assist        Assist Level: Minimal Assistance - Patient > 75%   Wheelchair 150 feet activity     Assist      Assist Level: Moderate Assistance - Patient 50 - 74%   Blood pressure (!) 100/53, pulse 72, temperature 97.9 F (36.6 C), temperature source Oral, resp. rate 15, weight 48.5 kg, SpO2 100 %.  Medical Problem List and Plan: 1. Functional deficits secondary to [place] ACA/PCA watershed strokes with R hemiparesis and aphasia             -patient may  shower             -ELOS/Goals: 12-14 days supervision to min A PT and OT; and min-mod A for SLP  Continue CIR, PT OT SLP  Metanx daily started  F/u homocysteine level 2.  Antithrombotics: -DVT/anticoagulation:  Pharmaceutical: Lovenox             -antiplatelet therapy: DAPT X 3 weeks followed by Plavix alone.  3. Pain Management: Tylenol prn.  4. Mood/Behavior/Sleep: LCSW to follow for evaluation and suppor.t              -antipsychotic agents:  Zyprexa/hs 5. Neuropsych/cognition: This patient is not capable of making decisions on his own behalf. 6. Skin/Wound Care: Routine pressure relief measures.  7. Fluids/Electrolytes/Nutrition: Monitor I/O. Nephro TID for nutritional supplement.  --Check CMET in am.  8. Hypotension/HTN: Monitor BP TID--IVF d/c yesterday --will set hold parameters for Toprol XL -2/11 elevated this AM but has been controlled, continue to monitor trend    03/03/2022    6:16 AM 03/02/2022    7:43 PM 03/02/2022    2:46 PM  Vitals with BMI  Systolic [id number]  Diastolic 53 64 64  Pulse 72 89 82    9. T2DM: Hgb A1c-10.6 and  poorly controlled. Was on insulin at home per chart --continue insulin glargine 15 units BID (increased to bid on 02/08) --on Farxiga--continue to hold glucotrol Add metformin 561m daily. D/c Nepro. Check creatinine since metformin started             --Monitor BS ac/hs and use SSI for elevated  BS during day only  2/10 decrease semglee to to 12u BID for hypoglycemic event  2/11 discussed with pharmacy- CBG has been elevated, will change from sensitive to moderate SSI scale  Add magnesium gluconate 29m HS  Decrease insulin to 6U BID given hypoglycemia to 52 10. Neuropathy: On Cymbalta and Neurontin BID.  11. Chronic abdominal pain/GERD: Continue Pepcid and Protonix.  12. Schizoaffective disorder and PTSD: Continue Zyprex, trazodone and cymbalta.   2/10 Psychiatry visit today, rec continue current medications, psych signed out  Appears stable continue current medications 13. Elevated lipids: Chol-291 w/LDL-217. Continue Lipitor.  13. H/o ETOH/cocaine/polysubstance use: was (+) for cocaine and Marijuana and EtOH-Has completed CIWA protocol. Metoprolol changed to coreg due to risks of hypotension with history of cocaine use. Folic acid replaced with Metanx 14. Underweight: encourage high protein diet.  15. Screening for vitamin D deficiency: f/u vitamin D level 16. Constipation: continue sorbitol and miralax, messaged nursing to determine when last BM was. Increase magnesium gluconate to 50102mHS 17.  Low albumin  -Encourage protein intake 18. Hypotension/hypertension fluctuating: d/c coreg. Add magnesium gluconate 25036mS. Improved, continue this regimen  LOS: 7 days A FACE TO FACE EVALUATION WAS PERFORMED  KruMartha ClanRaulkar 03/03/2022, 11:26 AM

## 2022-03-03 NOTE — Progress Notes (Signed)
Occupational Therapy Discharge Summary  Patient Details  Name: William Acevedo MRN: [id number] Date of Birth: 1955/07/08  Date of Discharge from OT service:March 03, 2022  Today's Date: 03/03/2022   Patient has met 45 of 13 long term goals due to improved activity tolerance, improved balance, postural control, improved attention, improved awareness, and improved coordination. Pt making significant progress during LOS. Pt currently requires supervision assist for bathing, and 3/3 toileting tasks. Pt completes dressing with supervision-independent needing more assist for LB dressing. Pt completes ambulatory ADL transfers with no AD and supervision. Pt continues to present with baseline cognitive deficits.  Patient to discharge at overall Supervision level.  Patient's care partner is independent to provide the necessary physical assistance at discharge.  Pts brother did not come into for fmaily ed.   Reasons goals not met: NA  Recommendation:  Patient will benefit from ongoing skilled OT services in home health setting to continue to advance functional skills in the area of BADL and Reduce care partner burden.  Equipment: NA  Reasons for discharge: treatment goals met and discharge from hospital  Patient/family agrees with progress made and goals achieved: Yes  OT Discharge Precautions/Restrictions  Precautions Precautions: Fall Precaution Comments: R inattention Restrictions Weight Bearing Restrictions: No  ADL ADL Eating: Independent Where Assessed-Eating: Edge of bed Grooming: Supervision/safety Where Assessed-Grooming: Standing at sink Upper Body Bathing: Supervision/safety Where Assessed-Upper Body Bathing: Standing at sink Lower Body Bathing: Supervision/safety Where Assessed-Lower Body Bathing: Standing at sink Upper Body Dressing: Independent (per clinincal judgment) Where Assessed-Upper Body Dressing: Edge of bed Lower Body Dressing: Supervision/safety Where  Assessed-Lower Body Dressing: Edge of bed Toileting: Supervision/safety Where Assessed-Toileting: Glass blower/designer: Distant supervision Armed forces technical officer Method: Ambulating Tub/Shower Transfer: Close supervison Clinical cytogeneticist Method: Optometrist: Gaffer Baseline Vision/History: 1 Wears glasses Patient Visual Report: Blurring of vision Vision Assessment?: Yes Eye Alignment: Within Functional Limits Ocular Range of Motion: Within Functional Limits Alignment/Gaze Preference: Within Defined Limits Tracking/Visual Pursuits: Able to track stimulus in all quads without difficulty Saccades: Within functional limits Convergence: Impaired (comment) Visual Fields: No apparent deficits Additional Comments: unable to converge although feel cog related Perception  Perception: Impaired Inattention/Neglect: Does not attend to right visual field (mild R inattention, improved from eval) Praxis Praxis: Intact Praxis Impairment Details: Motor planning Cognition Cognition Arousal/Alertness: Lethargic Awareness: Impaired Awareness Impairment: Anticipatory impairment Brief Interview for Mental Status (BIMS) Repetition of Three Words (First Attempt): 3 Temporal Orientation: Year: Correct Temporal Orientation: Month: Accurate within 5 days (increased tim e) Temporal Orientation: Day: Correct Recall: "Sock": Yes, after cueing ("something to wear") Recall: "Blue": No, could not recall Recall: "Bed": No, could not recall BIMS Summary Score: 10 Sensation Sensation Light Touch: Appears Intact Coordination Gross Motor Movements are Fluid and Coordinated: No Finger Nose Finger Test: minor dysmetria Heel Shin Test: ataxic and slower w/ RLE Motor  Motor Motor: Hemiplegia;Abnormal postural alignment and control Motor - Discharge Observations: R hemi - mild Mobility  Bed Mobility Bed Mobility: Sit to Supine;Supine to Sit Supine to Sit:  Supervision/Verbal cueing Sit to Supine: Supervision/Verbal cueing Transfers Sit to Stand: Supervision/Verbal cueing Stand to Sit: Supervision/Verbal cueing  Trunk/Postural Assessment  Cervical Assessment Cervical Assessment: Within Functional Limits Thoracic Assessment Thoracic Assessment: Within Functional Limits Lumbar Assessment Lumbar Assessment: Exceptions to [facility] (posterior tilt) Postural Control Postural Control: Deficits on evaluation Righting Reactions: delayed stepping strategies  Balance Static Sitting Balance Static Sitting - Balance Support: Feet supported Static Sitting - Level of Assistance: 7: Independent Dynamic  Sitting Balance Dynamic Sitting - Balance Support: Feet supported Dynamic Sitting - Level of Assistance: 7: Independent Static Standing Balance Static Standing - Balance Support: During functional activity Static Standing - Level of Assistance: 7: Independent Dynamic Standing Balance Dynamic Standing - Balance Support: During functional activity Dynamic Standing - Level of Assistance: 5: Stand by assistance (supervision) Extremity/Trunk Assessment RUE Assessment RUE Assessment: Exceptions to [facility] Active Range of Motion (AROM) Comments: shoulder flexion to ~ 120 * General Strength Comments: overall 4-/5 LUE Assessment LUE Assessment: Exceptions to [facility] General Strength Comments: overall 4/5   Precious Haws 03/03/2022, 9:37 AM

## 2022-03-03 NOTE — Group Note (Addendum)
Patient Details Name: William Acevedo MRN: [id number] DOB: 02-Aug-1955 Today's Date: 03/03/2022  Time Calculation: OT Group Time Calculation OT Group Start Time: [id number] OT Group Stop Time: 1533 OT Group Time Calculation (min): 60 min      Group Description: BUE Therex Group: Pt participated in group session with a focus on BUE strength and endurance to facilitate improved activity tolerance and strength for higher level BADLs and functional mobility tasks.    Individual level documentation: Pt first in engaged in seated warm up where pt completed ball tosses around the circle with an emphasis on bilateral integration, motor planning, hand eye coordination and general strengthening. Graded task up with pt instructed to toss ball up and clap once before catching ball, pt with delayed coordination during task. Added social component with pt stating one good thing about today. Pt shared that he was "going home tomorrow."   Next, pt completed seated bicep curls, shoulder flexion/extension, forward punches, and upright rows with 1 lb weights . Patient able to complete up to 20 reps. Pt needed MAX hand over hand cues to complete therex.  Pts given HEP with pt able to complete below therex with level 3 theraband, pt required MAX hand over hand cues to completed therex:  X10 shoulder flexion  X10 bicep curls X10 shoulder horizontal ABD X10 shoulder diagonal pulls X10 shoulder extension  X10 alternating punches X10 bilateral shoulder external rotation    Ended session with light stretching. Of note pt did report onset of blurry vision, however pt has c/o this before, alerted RN who came to check CBG as this has been an issue today, CBG WFL.  Pt transported back to room by RT.   Pain: Unrated pain reported in LUE, rest breaks provided as needed as well as decreasing weight for pain mgmt.   Corinne Ports [facility] 03/03/2022, 4:12 PM

## 2022-03-03 NOTE — Progress Notes (Shared)
Physical Therapy Discharge Summary  Patient Details  Name: William Acevedo MRN: [id number] Date of Birth: 07/04/1955  Date of Discharge from PT service:March 03, 2022  {CHL IP REHAB PT TIME CALCULATION:[id number]}   Patient has met 9 of 9 long term goals due to improved activity tolerance, improved balance, increased strength, ability to compensate for deficits, functional use of  right lower extremity, and improved attention.  Patient to discharge at an ambulatory level Supervision.   Patient's care partner is independent to provide the necessary physical and cognitive assistance at discharge.  Reasons goals not met: n/a  Recommendation:  Patient will benefit from ongoing skilled PT services in home health setting to continue to advance safe functional mobility, address ongoing impairments in dynamic standing balance, gait impairments, home safety, and minimize fall risk.  Equipment: RW  Reasons for discharge: treatment goals met and discharge from hospital  Patient/family agrees with progress made and goals achieved: Yes  PT Discharge Precautions/Restrictions Precautions Precautions: Fall Precaution Comments: R inattention Restrictions Weight Bearing Restrictions: No Pain Interference   Vision/Perception     Cognition   Sensation   Motor  Motor Motor: Hemiplegia;Abnormal postural alignment and control Motor - Discharge Observations: R hemi - mild  Mobility Bed Mobility Bed Mobility: Sit to Supine;Supine to Sit Supine to Sit: Supervision/Verbal cueing Sit to Supine: Supervision/Verbal cueing Transfers Transfers: Stand to Sit;Sit to Stand;Stand Pivot Transfers Sit to Stand: Supervision/Verbal cueing Stand to Sit: Supervision/Verbal cueing Stand Pivot Transfers: Supervision/Verbal cueing Stand Pivot Transfer Details: Verbal cues for precautions/safety;Verbal cues for gait pattern;Verbal cues for technique Transfer (Assistive device): Rolling  walker Locomotion  Gait Ambulation: Yes Gait Assistance: Supervision/Verbal cueing Gait Distance (Feet): 150 Feet Assistive device: Rolling walker Gait Assistance Details: Verbal cues for safe use of DME/AE;Verbal cues for gait pattern;Verbal cues for precautions/safety Gait Gait: Yes Gait Pattern: Impaired Gait Pattern: Step-through pattern;Trunk flexed;Narrow base of support;Lateral trunk lean to right;Poor foot clearance - right High Level Ambulation High Level Ambulation: Side stepping;Backwards walking Side Stepping: CGA Backwards Walking: CGA Stairs / Additional Locomotion Stairs: Yes Stairs Assistance: Supervision/Verbal cueing Stair Management Technique: Two rails;Forwards Number of Stairs: 12 Height of Stairs: 6 Wheelchair Mobility Wheelchair Mobility: No  Trunk/Postural Assessment  Cervical Assessment Cervical Assessment: Within Functional Limits Thoracic Assessment Thoracic Assessment: Within Functional Limits Lumbar Assessment Lumbar Assessment: Exceptions to [facility] (posterior tilt) Postural Control Postural Control: Deficits on evaluation Righting Reactions: delayed stepping strategies  Balance   Extremity Assessment            Jola Critzer P Icarus Partch PT, DPT 03/03/2022, 7:46 AM

## 2022-03-03 NOTE — Significant Event (Signed)
Hypoglycemic Event  CBG: 52  Treatment: 8 oz juice/soda Breakfast tray given to patient  Symptoms: Irritable   Follow-up CBG: Time:706 CBG Result:69  Possible Reasons for Event: Unknown  Comments/MD notified: At 0706 pt just finishing breakfast, oncoming nurse notified to recheck CBG.     William Acevedo Cheri Kearns

## 2022-03-03 NOTE — Progress Notes (Addendum)
Hypoglycemic Event  CBG: 60  Treatment: 8oz apple juice c peanut butter and graham crackers;followed by lunch  Symptoms: None  Follow-up CBG: Time:50 CBG Result:74  Possible Reasons for Event: Unknown  Comments/MD notified:[name] notified;RN to report evening BS for monitoring    William Hams ATKINSO

## 2022-03-03 NOTE — Plan of Care (Cosign Needed)
  Problem: RH Balance Goal: LTG Patient will maintain dynamic sitting balance (PT) Description: LTG:  Patient will maintain dynamic sitting balance with assistance during mobility activities (PT) Outcome: Completed/Met Goal: LTG Patient will maintain dynamic standing balance (PT) Description: LTG:  Patient will maintain dynamic standing balance with assistance during mobility activities (PT) Outcome: Completed/Met   Problem: RH Bed Mobility Goal: LTG Patient will perform bed mobility with assist (PT) Description: LTG: Patient will perform bed mobility with assistance, with/without cues (PT). Outcome: Completed/Met   Problem: RH Bed to Chair Transfers Goal: LTG Patient will perform bed/chair transfers w/assist (PT) Description: LTG: Patient will perform bed to chair transfers with assistance (PT). Outcome: Completed/Met   Problem: RH Car Transfers Goal: LTG Patient will perform car transfers with assist (PT) Description: LTG: Patient will perform car transfers with assistance (PT). Outcome: Completed/Met   Problem: RH Ambulation Goal: LTG Patient will ambulate in controlled environment (PT) Description: LTG: Patient will ambulate in a controlled environment, # of feet with assistance (PT). Outcome: Completed/Met Goal: LTG Patient will ambulate in home environment (PT) Description: LTG: Patient will ambulate in home environment, # of feet with assistance (PT). Outcome: Completed/Met   Problem: RH Wheelchair Mobility Goal: LTG Patient will propel w/c in controlled environment (PT) Description: LTG: Patient will propel wheelchair in controlled environment, # of feet with assist (PT) Outcome: Completed/Met   

## 2022-03-04 DIAGNOSIS — I6389 Other cerebral infarction: Secondary | ICD-10-CM | POA: Diagnosis not present

## 2022-03-04 LAB — GLUCOSE, CAPILLARY
Glucose-Capillary: 59 mg/dL — ABNORMAL LOW (ref 70–99)
Glucose-Capillary: 56 mg/dL — ABNORMAL LOW (ref 70–99)
Glucose-Capillary: 62 mg/dL — ABNORMAL LOW (ref 70–99)
Glucose-Capillary: 93 mg/dL (ref 70–99)
Glucose-Capillary: 178 mg/dL — ABNORMAL HIGH (ref 70–99)

## 2022-03-04 MED ORDER — INSULIN GLARGINE-YFGN 100 UNIT/ML ~~LOC~~ SOLN: 5.0000 [IU] | Freq: Every day | SUBCUTANEOUS | Status: DC

## 2022-03-04 MED ORDER — ATORVASTATIN CALCIUM 80 MG PO TABS: 80.0000 mg | ORAL_TABLET | Freq: Every day | ORAL | 0 refills | Status: DC

## 2022-03-04 MED ORDER — LANTUS SOLOSTAR 100 UNIT/ML ~~LOC~~ SOPN: 5.0000 [IU] | PEN_INJECTOR | Freq: Every day | SUBCUTANEOUS | 0 refills | Status: DC

## 2022-03-04 MED ORDER — PEN NEEDLES 31G X 5 MM MISC: 1.0000 | Freq: Every day | 0 refills | Status: DC

## 2022-03-04 MED ORDER — L-METHYLFOLATE-B6-B12 3-35-2 MG PO TABS: 1.0000 | ORAL_TABLET | Freq: Every day | ORAL | 0 refills | Status: DC

## 2022-03-04 MED ORDER — MAGNESIUM GLUCONATE 500 MG PO TABS: 500.0000 mg | ORAL_TABLET | Freq: Every day | ORAL | 0 refills | Status: DC

## 2022-03-04 MED ORDER — OLANZAPINE 5 MG PO TABS: 5.0000 mg | ORAL_TABLET | Freq: Every day | ORAL | 0 refills | Status: DC

## 2022-03-04 MED ORDER — INSULIN GLARGINE 100 UNIT/ML SOLOSTAR PEN: 5.0000 [IU] | PEN_INJECTOR | Freq: Two times a day (BID) | SUBCUTANEOUS | 0 refills | Status: DC

## 2022-03-04 MED ORDER — ADULT MULTIVITAMIN W/MINERALS CH: 1.0000 | ORAL_TABLET | Freq: Every day | ORAL | 0 refills | Status: DC

## 2022-03-04 MED ORDER — DULOXETINE HCL 60 MG PO CPEP: 60.0000 mg | ORAL_CAPSULE | Freq: Every day | ORAL | 0 refills | Status: DC

## 2022-03-04 MED ORDER — FAMOTIDINE 20 MG PO TABS: 20.0000 mg | ORAL_TABLET | Freq: Every day | ORAL | 0 refills | Status: DC

## 2022-03-04 MED ORDER — TRAZODONE HCL 100 MG PO TABS: 100.0000 mg | ORAL_TABLET | Freq: Every day | ORAL | 0 refills | Status: DC

## 2022-03-04 MED ORDER — INSULIN GLARGINE 100 UNIT/ML SOLOSTAR PEN: 5.0000 [IU] | PEN_INJECTOR | Freq: Every day | SUBCUTANEOUS | 0 refills | Status: DC

## 2022-03-04 MED ORDER — MELATONIN 3 MG PO TABS: 6.0000 mg | ORAL_TABLET | Freq: Every evening | ORAL | 0 refills | Status: DC | PRN

## 2022-03-04 MED ORDER — CLOPIDOGREL BISULFATE 75 MG PO TABS: 75.0000 mg | ORAL_TABLET | Freq: Every day | ORAL | 0 refills | Status: DC

## 2022-03-04 MED ORDER — GABAPENTIN 300 MG PO CAPS: 300.0000 mg | ORAL_CAPSULE | Freq: Two times a day (BID) | ORAL | 0 refills | Status: DC

## 2022-03-04 MED ORDER — SENNOSIDES-DOCUSATE SODIUM 8.6-50 MG PO TABS: 1.0000 | ORAL_TABLET | Freq: Every evening | ORAL | 0 refills | Status: DC | PRN

## 2022-03-04 MED ORDER — ACETAMINOPHEN 325 MG PO TABS: 325.0000 mg | ORAL_TABLET | ORAL | Status: DC | PRN

## 2022-03-04 MED ORDER — PEN NEEDLES 32G X 5 MM MISC: 1.0000 "application " | Freq: Every day | 0 refills | Status: DC

## 2022-03-04 MED ORDER — ASPIRIN 81 MG PO TBEC: 81.0000 mg | DELAYED_RELEASE_TABLET | Freq: Every day | ORAL | 0 refills | Status: DC

## 2022-03-04 MED ORDER — METFORMIN HCL 1000 MG PO TABS: 1000.0000 mg | ORAL_TABLET | Freq: Two times a day (BID) | ORAL | 0 refills | Status: DC

## 2022-03-04 MED ORDER — FARXIGA 10 MG PO TABS: 10.0000 mg | ORAL_TABLET | Freq: Every day | ORAL | 0 refills | Status: DC

## 2022-03-04 NOTE — Progress Notes (Signed)
PROGRESS NOTE   Subjective/Complaints: No new complaints this morning Hypoglycemic- decrease insulin to 5U once per day, discussed plan with brother to wean off outpatient   ROS: denies pain, CP, SOB, abdominal pain   Objective:   No results found. No results for input(s): "WBC", "HGB", "HCT", "PLT" in the last 72 hours.  Recent Labs    03/02/22 1130  NA 137  K 4.7  CL 98  CO2 28  GLUCOSE 92  BUN 28*  CREATININE 1.00  CALCIUM 9.3    Intake/Output Summary (Last 24 hours) at 03/04/2022 0920 Last data filed at 03/03/2022 1900 Gross per 24 hour  Intake 420 ml  Output --  Net 420 ml        Physical Exam: Vital Signs Blood pressure (!) 174/85, pulse 87, temperature 98 F (36.7 C), resp. rate 16, weight 48.7 kg, SpO2 99 %. Gen: no distress, normal appearing seen at bedside, appears comfortable, BMI 17.26 HEENT: oral mucosa pink and moist, NCAT, poor dentition Cardio: Reg rate Chest: CTAB, normal effort, normal rate of breathing Abd: soft, non-distended, positive bowel sounds Ext: no edema Psych: affect is much brighter Skin: intact Genitourinary:    Comments: Condom catheter with medium amber urine Musculoskeletal:     Cervical back: Neck supple. No tenderness.     Comments: RUE 4+/5 in biceps, triceps, WE< grip and FA 4/5 LUE 5/5 in same muscles RLE- HF 4-/5; otherwise KE/DF and PF 4+/5 LLE 5/5 in same muscles   Skin:    General: Skin is warm and dry.  Neurological:     Mental Status: He is alert.     Sensory: Sensory deficit present.     Coordination: Coordination abnormal.     Comments: Sleeping this AM but wakes to voice. Oriented to self, place as hospital (unable to state name) or situation. Polite with limited verbal output. He was able to follow simple motor commands.    Aphasic; at word level- some word substitutions Decreased fine motor movement on RUE/hand Decreased to light touch in LUE;  otherwise normal/intact in RUE and B/L LE's  R>L postural sway  Ambulating CG   Assessment/Plan: 1. Functional deficits which require 3+ hours per day of interdisciplinary therapy in a comprehensive inpatient rehab setting. Physiatrist is providing close team supervision and 24 hour management of active medical problems listed below. Physiatrist and rehab team continue to assess barriers to discharge/monitor patient progress toward functional and medical goals  Care Tool:  Bathing    Body parts bathed by patient: Right arm, Right lower leg, Left arm, Left lower leg, Chest, Face, Abdomen, Front perineal area, Buttocks, Right upper leg, Left upper leg   Body parts bathed by helper: Right lower leg, Left lower leg     Bathing assist Assist Level: Supervision/Verbal cueing     Upper Body Dressing/Undressing Upper body dressing   What is the patient wearing?: Pull over shirt    Upper body assist Assist Level: Independent (per clinincal judgement)    Lower Body Dressing/Undressing Lower body dressing      What is the patient wearing?: Pants     Lower body assist Assist for lower body dressing: Supervision/Verbal cueing  Toileting Toileting    Toileting assist Assist for toileting: Supervision/Verbal cueing     Transfers Chair/bed transfer  Transfers assist     Chair/bed transfer assist level: Supervision/Verbal cueing     Locomotion Ambulation   Ambulation assist      Assist level: Supervision/Verbal cueing Assistive device: Walker-rolling Max distance: 246f   Walk 10 feet activity   Assist     Assist level: Supervision/Verbal cueing Assistive device: Walker-rolling   Walk 50 feet activity   Assist    Assist level: Supervision/Verbal cueing Assistive device: Walker-rolling    Walk 150 feet activity   Assist    Assist level: Supervision/Verbal cueing Assistive device: Walker-rolling    Walk 10 feet on uneven surface   activity   Assist     Assist level: Supervision/Verbal cueing Assistive device: Walker-rolling   Wheelchair     Assist Is the patient using a wheelchair?: No Type of Wheelchair: Manual    Wheelchair assist level: Minimal Assistance - Patient > 75% Max wheelchair distance: 50    Wheelchair 50 feet with 2 turns activity    Assist        Assist Level: Minimal Assistance - Patient > 75%   Wheelchair 150 feet activity     Assist      Assist Level: Moderate Assistance - Patient 50 - 74%   Blood pressure (!) 174/85, pulse 87, temperature 98 F (36.7 C), resp. rate 16, weight 48.7 kg, SpO2 99 %.  Medical Problem List and Plan: 1. Functional deficits secondary to [place] ACA/PCA watershed strokes with R hemiparesis and aphasia             -patient may  shower             -ELOS/Goals: 9 days supervision to min A PT and OT; and min-mod A for SLP  D/c home  Metanx daily started  Homocysteine reviewed and is 12.4 2.  Antithrombotics: -DVT/anticoagulation:  Pharmaceutical: Lovenox             -antiplatelet therapy: DAPT X 3 weeks followed by Plavix alone.  3. Pain Management: Tylenol prn.  4. Mood/Behavior/Sleep: LCSW to follow for evaluation and suppor.t              -antipsychotic agents:  Zyprexa/hs 5. Neuropsych/cognition: This patient is not capable of making decisions on his own behalf. 6. Skin/Wound Care: Routine pressure relief measures.  7. Fluids/Electrolytes/Nutrition: Monitor I/O. Nephro TID for nutritional supplement.  --Check CMET in am.  8. Hypotension/HTN: Monitor BP TID--IVF d/c yesterday --will set hold parameters for Toprol XL -2/11 elevated this AM but has been controlled, continue to monitor trend    03/04/2022    8:11 AM 03/04/2022    5:06 AM 03/03/2022    9:34 PM  Vitals with BMI  Weight   107 lbs 6 oz  BMI   [id number] Systolic [id number]  Diastolic 85 62   Pulse 87 83     9. T2DM: Hgb A1c-10.6 and poorly controlled. Was on insulin at  home per chart --continue insulin glargine 15 units BID (increased to bid on 02/08) --on Farxiga--continue to hold glucotrol Add metformin 5087mdaily. D/c Nepro. Check creatinine since metformin started             --Monitor BS ac/hs and use SSI for elevated BS during day only  2/10 decrease semglee to to 12u BID for hypoglycemic event  2/11 discussed with pharmacy- CBG has been elevated, will change from  sensitive to moderate SSI scale  Add magnesium gluconate 21m HS  Decrease insulin to 5U daily.  10. Neuropathy: On Cymbalta and Neurontin BID.  11. Chronic abdominal pain/GERD: Continue Pepcid and Protonix.  12. Schizoaffective disorder and PTSD: Continue Zyprex, trazodone and cymbalta.   2/10 Psychiatry visit today, rec continue current medications, psych signed out  Discussed with brother that insulin resistance can contribute to mood disturbance 13. Elevated lipids: Chol-291 w/LDL-217. Continue Lipitor.  13. H/o ETOH/cocaine/polysubstance use: was (+) for cocaine and Marijuana and EtOH-Has completed CIWA protocol. Metoprolol changed to coreg due to risks of hypotension with history of cocaine use. Folic acid replaced with Metanx 14. Underweight: encourage high protein diet.  15. Screening for vitamin D deficiency: f/u vitamin D level 16. Constipation: d/c sorbitol, messaged nursing to determine when last BM was. Increase magnesium gluconate to 5052mHS 17.  Low albumin  -Encourage protein intake 18. Hypotension/hypertension fluctuating: d/c coreg. Increase magnesium gluconate to 50018mS  LOS: 8 days A FACE TO FACE EVALUATION WAS PERFORMED  Nyrah Demos P Jatniel Verastegui 03/04/2022, 9:20 AM

## 2022-03-04 NOTE — Plan of Care (Signed)
  Problem: Education: Goal: Knowledge of disease or condition will improve Outcome: Completed/Met Goal: Knowledge of secondary prevention will improve (MUST DOCUMENT ALL) Outcome: Completed/Met Goal: Knowledge of patient specific risk factors will improve William Acevedo N/A or DELETE if not current risk factor) Outcome: Completed/Met   Problem: Ischemic Stroke/TIA Tissue Perfusion: Goal: Complications of ischemic stroke/TIA will be minimized Outcome: Completed/Met   Problem: Coping: Goal: Will verbalize positive feelings about self Outcome: Completed/Met Goal: Will identify appropriate support needs Outcome: Completed/Met   Problem: Health Behavior/Discharge Planning: Goal: Ability to manage health-related needs will improve Outcome: Completed/Met Goal: Goals will be collaboratively established with patient/family Outcome: Completed/Met   Problem: Self-Care: Goal: Ability to participate in self-care as condition permits will improve Outcome: Completed/Met Goal: Verbalization of feelings and concerns over difficulty with self-care will improve Outcome: Completed/Met Goal: Ability to communicate needs accurately will improve Outcome: Completed/Met   Problem: Nutrition: Goal: Risk of aspiration will decrease Outcome: Completed/Met Goal: Dietary intake will improve Outcome: Completed/Met   Problem: Education: Goal: Ability to describe self-care measures that may prevent or decrease complications (Diabetes Survival Skills Education) will improve Outcome: Completed/Met Goal: Individualized Educational Video(s) Outcome: Completed/Met   Problem: Coping: Goal: Ability to adjust to condition or change in health will improve Outcome: Completed/Met   Problem: Fluid Volume: Goal: Ability to maintain a balanced intake and output will improve Outcome: Completed/Met   Problem: Health Behavior/Discharge Planning: Goal: Ability to identify and utilize available resources and services will  improve Outcome: Completed/Met Goal: Ability to manage health-related needs will improve Outcome: Completed/Met   Problem: Metabolic: Goal: Ability to maintain appropriate glucose levels will improve Outcome: Completed/Met   Problem: Nutritional: Goal: Maintenance of adequate nutrition will improve Outcome: Completed/Met Goal: Progress toward achieving an optimal weight will improve Outcome: Completed/Met   Problem: Skin Integrity: Goal: Risk for impaired skin integrity will decrease Outcome: Completed/Met   Problem: Tissue Perfusion: Goal: Adequacy of tissue perfusion will improve Outcome: Completed/Met

## 2022-03-04 NOTE — Progress Notes (Signed)
Patient discharged to home. Family and patient received discharge instructions from PA Pam L. Patient belongings were packed up by family and nurse tech. Patient was discharged via wheelchair. Sanda Linger, RN

## 2022-03-04 NOTE — Discharge Summary (Signed)
Physician Discharge Summary  Patient ID: William Acevedo MRN: [id number] DOB/AGE: March 13, 1955 67 y.o.  Admit date: 02/24/2022 Discharge date: 03/04/2022  Discharge Diagnoses:  Principal Problem:   Acute ischemic multifocal multiple vascular territories stroke [facility]) Active Problems:   DM2 (diabetes mellitus, type 2) ([place])   Cocaine abuse ([place])   CVA (cerebral vascular accident) ([place])   Schizoaffective disorder ([place])   Cannabis dependence, continuous abuse ([place])   Discharged Condition: {condition:18240}  Significant Diagnostic Studies: ECHOCARDIOGRAM LIMITED BUBBLE STUDY  Result Date: 02/18/2022    ECHOCARDIOGRAM LIMITED REPORT   Patient Name:   William Acevedo Date of Exam: 02/18/2022 Medical Rec #:  [id number]           Height:       66.0 in Accession #:    [id number]          Weight:       99.6 lb Date of Birth:  07-31-1955           BSA:          1.487 m Patient Age:    67 years            BP:           175/91 mmHg Patient Gender: M                   HR:           80 bpm. Exam Location:  Inpatient Procedure: Limited Echo, Color Doppler, Cardiac Doppler and Saline Contrast            Bubble Study Indications:    stroke  History:        Patient has prior history of Echocardiogram examinations, most                 recent 07/10/2020. CAD and Previous Myocardial Infarction,                 Stroke; Risk Factors:Diabetes.  Sonographer:    Harvie Junior Referring Phys: [id number] SARA-MAIZ A THOMAS  Sonographer Comments: Technically difficult study due to poor echo windows. IMPRESSIONS  1. Left ventricular ejection fraction, by estimation, is 55 to 60%. The left ventricle has normal function. The left ventricle has no regional wall motion abnormalities. There is mild asymmetric left ventricular hypertrophy of the basal and septal segments. Left ventricular diastolic parameters are consistent with Grade I diastolic dysfunction (impaired relaxation). Elevated left ventricular end-diastolic pressure.  2.  Right ventricular systolic function is normal. The right ventricular size is normal.  3. The mitral valve is abnormal. Trivial mitral valve regurgitation. No evidence of mitral stenosis. Severe mitral annular calcification.  4. The aortic valve is tricuspid. There is moderate calcification of the aortic valve. There is moderate thickening of the aortic valve. Aortic valve regurgitation is not visualized. Aortic valve sclerosis/calcification is present, without any evidence of aortic stenosis.  5. The inferior vena cava is normal in size with greater than 50% respiratory variability, suggesting right atrial pressure of 3 mmHg. FINDINGS  Left Ventricle: Left ventricular ejection fraction, by estimation, is 55 to 60%. The left ventricle has normal function. The left ventricle has no regional wall motion abnormalities. The left ventricular internal cavity size was normal in size. There is  mild asymmetric left ventricular hypertrophy of the basal and septal segments. Left ventricular diastolic parameters are consistent with Grade I diastolic dysfunction (impaired relaxation). Elevated left ventricular end-diastolic pressure. Right Ventricle: The right ventricular size is normal. No  increase in right ventricular wall thickness. Right ventricular systolic function is normal. Left Atrium: Left atrial size was normal in size. Right Atrium: Right atrial size was normal in size. Pericardium: There is no evidence of pericardial effusion. Mitral Valve: The mitral valve is abnormal. There is moderate thickening of the mitral valve leaflet(s). There is moderate calcification of the mitral valve leaflet(s). Severe mitral annular calcification. Trivial mitral valve regurgitation. No evidence of mitral valve stenosis. Tricuspid Valve: The tricuspid valve is normal in structure. Tricuspid valve regurgitation is not demonstrated. No evidence of tricuspid stenosis. Aortic Valve: The aortic valve is tricuspid. There is moderate  calcification of the aortic valve. There is moderate thickening of the aortic valve. Aortic valve regurgitation is not visualized. Aortic valve sclerosis/calcification is present, without any  evidence of aortic stenosis. Aortic valve mean gradient measures 1.0 mmHg. Aortic valve peak gradient measures 2.6 mmHg. Aortic valve area, by VTI measures 3.01 cm. Pulmonic Valve: The pulmonic valve was normal in structure. Pulmonic valve regurgitation is not visualized. No evidence of pulmonic stenosis. Aorta: The aortic root is normal in size and structure. Venous: The inferior vena cava is normal in size with greater than 50% respiratory variability, suggesting right atrial pressure of 3 mmHg. IAS/Shunts: The interatrial septum appears to be lipomatous. No atrial level shunt detected by color flow Doppler. Agitated saline contrast was given intravenously to evaluate for intracardiac shunting. LEFT VENTRICLE PLAX 2D LVIDd:         3.30 cm     Diastology LVIDs:         2.40 cm     LV e' medial:    5.66 cm/s LV PW:         1.20 cm     LV E/e' medial:  19.6 LV IVS:        1.20 cm     LV e' lateral:   6.20 cm/s LVOT diam:     1.80 cm     LV E/e' lateral: 17.9 LV SV:         54 LV SV Index:   36 LVOT Area:     2.54 cm  LV Volumes (MOD) LV vol d, MOD A2C: 64.6 ml LV vol d, MOD A4C: 64.3 ml LV vol s, MOD A2C: 26.2 ml LV vol s, MOD A4C: 25.7 ml LV SV MOD A2C:     38.4 ml LV SV MOD A4C:     64.3 ml LV SV MOD BP:      38.3 ml LEFT ATRIUM         Index LA diam:    2.70 cm 1.82 cm/m  AORTIC VALVE                    PULMONIC VALVE AV Area (Vmax):    2.84 cm     PV Vmax:       1.00 m/s AV Area (Vmean):   3.17 cm     PV Peak grad:  4.0 mmHg AV Area (VTI):     3.01 cm AV Vmax:           81.30 cm/s AV Vmean:          54.200 cm/s AV VTI:            0.180 m AV Peak Grad:      2.6 mmHg AV Mean Grad:      1.0 mmHg LVOT Vmax:         90.70 cm/s LVOT Vmean:  67.500 cm/s LVOT VTI:          0.213 m LVOT/AV VTI ratio: 1.18  AORTA Ao Root  diam: 3.00 cm MITRAL VALVE MV Area (PHT): 3.97 cm     SHUNTS MV Decel Time: 191 msec     Systemic VTI:  0.21 m MV E velocity: 111.00 cm/s  Systemic Diam: 1.80 cm MV A velocity: 157.00 cm/s MV E/A ratio:  0.71 Jenkins Rouge MD Electronically signed by Jenkins Rouge MD Signature Date/Time: 02/18/2022/3:25:53 PM    Final    CT Angio Head W or Wo Contrast  Result Date: 02/17/2022 CLINICAL DATA:  Stroke follow-up EXAM: CT ANGIOGRAPHY HEAD TECHNIQUE: Multidetector CT imaging of the head was performed using the standard protocol during bolus administration of intravenous contrast. Multiplanar CT image reconstructions and MIPs were obtained to evaluate the vascular anatomy. RADIATION DOSE REDUCTION: This exam was performed according to the departmental dose-optimization program which includes automated exposure control, adjustment of the mA and/or kV according to patient size and/or use of iterative reconstruction technique. CONTRAST:  21m OMNIPAQUE IOHEXOL 350 MG/ML SOLN COMPARISON:  Brain MRI 02/17/2022 FINDINGS: POSTERIOR CIRCULATION: --Vertebral arteries: Normal --Inferior cerebellar arteries: Normal. --Basilar artery: Normal. --Superior cerebellar arteries: Normal. --Posterior cerebral arteries: Normal. ANTERIOR CIRCULATION: --Intracranial internal carotid arteries: Severe stenosis of the proximal cavernous segment of the left ICA. Bilateral atherosclerotic calcification. --Anterior cerebral arteries (ACA): Left A2 segment occlusion, corresponding to the infarct seen on concomitant brain MRI. --Middle cerebral arteries (MCA): Normal. Venous sinuses: As permitted by contrast timing, patent. Anatomic variants: None Review of the MIP images confirms the above findings. IMPRESSION: 1. Left A2 segment occlusion, corresponding to the infarct seen on concomitant brain MRI. 2. Severe stenosis of the proximal cavernous segment of the left ICA. Electronically Signed   By: KUlyses JarredM.D.   On: 02/17/2022 20:46   MR BRAIN  WO CONTRAST  Result Date: 02/17/2022 CLINICAL DATA:  Stroke suspected EXAM: MRI HEAD WITHOUT CONTRAST TECHNIQUE: Multiplanar, multiecho pulse sequences of the brain and surrounding structures were obtained without intravenous contrast. COMPARISON:  07/10/2020 MRI head, correlation is made with CT head 02/17/2022 FINDINGS: Evaluation is somewhat limited by motion artifact. Brain: Restricted diffusion with ADC correlate in the medial left frontal lobe, primarily cortical and subcortical, extending from the anterior inferior left frontal lobe to the more superior and posterior left frontal lobe (series 5, images 15-27, 29, and 31), correlating with the left ACA territory and ACA-PCA watershed. These areas are associated with T2 hyperintense signal, likely cytotoxic edema. No acute hemorrhage, mass, mass effect, or midline shift. No hydrocephalus or extra-axial collection. Remote infarcts involving the bilateral occipital cortex, right parietal cortex, and posterior left frontal lobe white matter. Confluent T2 hyperintense signal in the periventricular white matter, likely the sequela of moderate to severe chronic small vessel ischemic disease. Vascular: Normal proximal arterial flow voids. Skull and upper cervical spine: Normal marrow signal. Sinuses/Orbits: Mucosal thickening in the posterior left ethmoid air cells. No acute finding in the orbits. Other: The mastoids are well aerated. IMPRESSION: 1. Acute to early subacute infarct in the medial left frontal lobe, correlating with the left ACA territory and ACA-PCA watershed. No evidence of hemorrhagic transformation or significant mass effect. 2. Remote infarcts involving the bilateral occipital cortex, right parietal cortex, and posterior left frontal lobe white matter. These results were called by telephone at the time of interpretation on 02/17/2022 at 7:02 pm to provider ZAMMIT , who verbally acknowledged these results. Electronically Signed   By:  Merilyn Baba  M.D.   On: 02/17/2022 19:03   CT Head Wo Contrast  Result Date: 02/17/2022 CLINICAL DATA:  Altered mental status, lethargy EXAM: CT HEAD WITHOUT CONTRAST TECHNIQUE: Contiguous axial images were obtained from the base of the skull through the vertex without intravenous contrast. RADIATION DOSE REDUCTION: This exam was performed according to the departmental dose-optimization program which includes automated exposure control, adjustment of the mA and/or kV according to patient size and/or use of iterative reconstruction technique. COMPARISON:  CT brain, 07/09/2020 FINDINGS: Brain: Extensive, multifocal bilateral encephalomalacia, some of which appears to be new or at least increased compared to prior examination, for example in the medial right frontal lobe (series 2, image 18) and in the right parietal vertex (series 2, image 18). No evidence of hemorrhage, hydrocephalus, extra-axial collection or mass lesion/mass effect. Vascular: No hyperdense vessel or unexpected calcification. Skull: Normal. Negative for fracture or focal lesion. Sinuses/Orbits: No acute finding. Other: None. IMPRESSION: Extensive, multifocal bilateral encephalomalacia, some of which appears to be new or at least increased compared to prior examination dated 07/09/2020 although is generally age indeterminate. Recommend MR to further evaluate if there is clinical suspicion for acute diffusion restricting infarction. Electronically Signed   By: Delanna Ahmadi M.D.   On: 02/17/2022 16:21   CT ABDOMEN PELVIS W CONTRAST  Result Date: 02/17/2022 CLINICAL DATA:  Abdominal pain.  Acute, nonlocalized. EXAM: CT ABDOMEN AND PELVIS WITH CONTRAST TECHNIQUE: Multidetector CT imaging of the abdomen and pelvis was performed using the standard protocol following bolus administration of intravenous contrast. RADIATION DOSE REDUCTION: This exam was performed according to the departmental dose-optimization program which includes automated exposure control,  adjustment of the mA and/or kV according to patient size and/or use of iterative reconstruction technique. CONTRAST:  14m OMNIPAQUE IOHEXOL 300 MG/ML  SOLN COMPARISON:  None Available. FINDINGS: Lower chest: Mild atelectasis in the RIGHT middle lobe. Otherwise lung bases clear. Hepatobiliary: No focal hepatic lesion. Normal gallbladder. No biliary duct dilatation. Common bile duct is normal. Pancreas: Pancreas normal Spleen: Normal spleen Adrenals/urinary tract: Adrenal glands normal. Bilateral nonobstructing renal calculi. No enhancing renal cortical lesion. Cortical scarring in the RIGHT kidney. Bladder unremarkable Stomach/Bowel: Stomach, small-bowel and cecum are normal. The appendix is not identified but there is no pericecal inflammation to suggest appendicitis. The colon and rectosigmoid colon are normal. Vascular/Lymphatic: Abdominal aorta is normal caliber with atherosclerotic calcification. There is no retroperitoneal or periportal lymphadenopathy. No pelvic lymphadenopathy. Reproductive: Prostate unremarkable Other: Very little intra-abdominal fat.  No free fluid or free air. Musculoskeletal: No aggressive osseous lesion. IMPRESSION: 1. No acute findings in the abdomen pelvis. 2. Bilateral nonobstructing renal calculi. 3. Very little intra-abdominal fat. 4.  Aortic Atherosclerosis (ICD10-I70.0). Electronically Signed   By: SSuzy BouchardM.D.   On: 02/17/2022 16:18   DG Chest Portable 1 View  Result Date: 02/17/2022 CLINICAL DATA:  Altered mental status EXAM: PORTABLE CHEST 1 VIEW COMPARISON:  CXR 01/23/22 FINDINGS: No pleural effusion. No pneumothorax. No focal airspace opacity. Normal cardiac and mediastinal contours. No radiographically apparent displaced rib fractures. Visualized upper abdomen is unremarkable. IMPRESSION: No active disease. Electronically Signed   By: HMarin RobertsM.D.   On: 02/17/2022 15:17    Labs:  Basic Metabolic Panel: Recent Labs  Lab 02/28/22 0832 03/02/22 1130   NA 135 137  K 4.5 4.7  CL 100 98  CO2 27 28  GLUCOSE 181* 92  BUN 28* 28*  CREATININE 1.13 1.00  CALCIUM 8.7* 9.3  CBC: Recent Labs  Lab 02/28/22 0832  WBC 6.1  HGB 10.7*  HCT 32.8*  MCV 87.9  PLT 262    CBG: Recent Labs  Lab 03/03/22 2106 03/04/22 0604 03/04/22 0632 03/04/22 0649 03/04/22 0657  GLUCAP 157* 59* 56* 62* 93    Brief HPI:   William Acevedo is a 67 y.o. male ***   Hospital Course: William Acevedo was admitted to rehab 02/24/2022 for inpatient therapies to consist of PT, ST and OT at least three hours five days a week. Past admission physiatrist, therapy team and rehab RN have worked together to provide customized collaborative inpatient rehab.   Blood pressures were monitored on TID basis and   Diabetes has been monitored with ac/hs CBG checks and SSI was use prn for tighter BS control.    Rehab course: During patient's stay in rehab weekly team conferences were held to monitor patient's progress, set goals and discuss barriers to discharge. At admission, patient required  He/She  has had improvement in activity tolerance, balance, postural control as well as ability to compensate for deficits. He/She has had improvement in functional use RUE/LUE  and RLE/LLE as well as improvement in awareness       Disposition: Discharge disposition: 01-Home or Self Care        Diet:  Special Instructions:  Discharge Instructions     Ambulatory referral to Neurology   Complete by: As directed    An appointment is requested in approximately: 4-6 weeks   Ambulatory referral to Physical Medicine Rehab   Complete by: As directed    Hospital follow up      Allergies as of 03/04/2022       Reactions   Lisinopril Swelling   Angioedema Pt reports not allergic 06/07/20   Omeprazole    Other reaction(s): ANGIOEDEMA OF LIPS, Angioedema of tongue. Pt reports not allergic 06/07/20        Medication List     STOP taking these  medications    HumaLOG KwikPen 100 UNIT/ML KwikPen Generic drug: insulin lispro       TAKE these medications    Accu-Chek Guide test strip Generic drug: glucose blood   acetaminophen 325 MG tablet Commonly known as: TYLENOL Take 1-2 tablets (325-650 mg total) by mouth every 4 (four) hours as needed for mild pain.   aspirin EC 81 MG tablet Take 1 tablet (81 mg total) by mouth daily. Swallow whole.   atorvastatin 80 MG tablet Commonly known as: LIPITOR Take 1 tablet (80 mg total) by mouth daily. What changed:  medication strength how much to take   blood glucose meter kit and supplies Kit Dispense based on patient and insurance preference. Use up to four times daily as directed. (FOR ICD-9 250.00, 250.01).   clopidogrel 75 MG tablet Commonly known as: PLAVIX Take 1 tablet (75 mg total) by mouth daily.   DULoxetine 60 MG capsule Commonly known as: CYMBALTA Take 1 capsule (60 mg total) by mouth daily. For chronic pain and mood   famotidine 20 MG tablet Commonly known as: PEPCID Take 1 tablet (20 mg total) by mouth daily before breakfast.   Farxiga 10 MG Tabs tablet Generic drug: dapagliflozin propanediol Take 1 tablet (10 mg total) by mouth daily.   gabapentin 300 MG capsule Commonly known as: NEURONTIN Take 1 capsule (300 mg total) by mouth 2 (two) times daily.   insulin glargine 100 UNIT/ML Solostar Pen Commonly known as: LANTUS Inject 5 Units into the skin  daily. What changed:  how much to take when to take this   l-methylfolate-B6-B12 3-35-2 MG Tabs tablet Commonly known as: METANX Take 1 tablet by mouth daily.   magnesium gluconate 500 MG tablet Commonly known as: MAGONATE Take 1 tablet (500 mg total) by mouth at bedtime.   melatonin 3 MG Tabs tablet Take 2 tablets (6 mg total) by mouth at bedtime as needed (for sleep).   metFORMIN 1000 MG tablet Commonly known as: GLUCOPHAGE Take 1 tablet (1,000 mg total) by mouth 2 (two) times daily with a  meal.   multivitamin with minerals Tabs tablet Take 1 tablet by mouth daily.   OLANZapine 5 MG tablet Commonly known as: ZYPREXA Take 1 tablet (5 mg total) by mouth at bedtime.   Pen Needles 31G X 5 MM Misc 1 Application by Does not apply route daily at 6 (six) AM.   senna-docusate 8.6-50 MG tablet Commonly known as: Senokot-S Take 1 tablet by mouth at bedtime as needed for mild constipation.   traZODone 100 MG tablet Commonly known as: DESYREL Take 1 tablet (100 mg total) by mouth at bedtime.        Follow-up Information     [facility] Follow up.   Why: Call in 1-2 days for post hospital follow up Contact information: [place] [address] [id number]         Izora Ribas, MD Follow up.   Specialty: Physical Medicine and Rehabilitation Contact information: [id number] [place] [address] [phone]         [facility] Follow up.   Why: office will call you with follow up appointment Contact information: [address]     [place] [phone]                Signed: Bary Leriche 03/04/2022, 10:24 AM

## 2022-03-04 NOTE — Progress Notes (Signed)
Inpatient Rehabilitation Care Coordinator Discharge Note   Patient Details  Name: William Acevedo MRN: [id number] Date of Birth: 07/26/1955   Discharge location: HOME WITH BROTHER WHO CAN PROVIDE 24/7 SUPERVISION  Length of Stay: 8 DAYS  Discharge activity level: SUPERVISION LEVEL  Home/community participation: SEDENTARY  Patient response [id number] Literacy - How often do you need to have someone help you when you read instructions, pamphlets, or other written material from your doctor or pharmacy?: Never  Patient response [id number] Isolation - How often do you feel lonely or isolated from those around you?: Rarely  Services provided included: MD, RD, PT, OT, RN, CM, TR, Pharmacy, Neuropsych, SW  Financial Services:  Charity fundraiser Utilized: [place] offered to/list presented to: PT AND BROTHER  Follow-up services arranged:  Home Health, Patient/Family has no preference for HH/DME agencies [place] [facility]         Patient response to transportation need: Is the patient able to respond to transportation needs?: Yes In the past 12 months, has lack of transportation kept you from medical appointments or from getting medications?: No In the past 12 months, has lack of transportation kept you from meetings, work, or from getting things needed for daily living?: No    Comments (or additional information): BROTHER AWARE WILL NEED 24/7 SUPERVISION. HE WAS ASSISTING PRIOR TO ADMISSION. WILL RESUME PSYCH AT [place]  Patient/Family verbalized understanding of follow-up arrangements:  Yes  Individual responsible for coordination of the follow-up plan: [name]-BROTHER [phone]  Confirmed correct DME delivered: Elease Hashimoto 03/04/2022    Elease Hashimoto

## 2022-03-04 NOTE — Progress Notes (Signed)
Inpatient Rehabilitation Discharge Medication Review by a Pharmacist   A complete drug regimen review was completed for this patient to identify any potential clinically significant medication issues.  High Risk Drug Classes Is patient taking? Indication by Medication  Antipsychotic Yes Olanzapine for schizoaffective disorder  Anticoagulant No   Antibiotic No   Opioid No   Antiplatelet Yes Aspirin x 1 week, clopidogrel for stroke and CAD   Hypoglycemics/insulin Yes Insulin glargine, metformin  for diabetes   Vasoactive Medication Yes Metoprolol for hypertension  Chemotherapy No   Other Yes Acetaminophen for pain Atorvastatin for hyperlipidemia Dapagliflozin for HFpEF and diabetes  Duloxetine for mood, PTSD, and nerve pain  Gabapentin for nerve pain Famotidine  for GERD  Multivitamin, magnesium gluconate, I- Methylfolate (Vit B6-B12) - supplement Senna-docusate- constipation Trazodone and melatonin for sleep      Type of Medication Issue Identified Description of Issue Recommendation(s)  Drug Interaction(s) (clinically significant)     Duplicate Therapy     Allergy     No Medication Administration End Date  Aspirin to continue for 3 weeks per neurology End date 03/09/22 . Discharge instructions indicate to stop ASA after 1 week.  (Okay)  Incorrect Dose     Additional Drug Therapy Needed     Significant med changes from prior encounter (inform family/care partners about these prior to discharge).    Other        Clinically significant medication issues were identified that warrant physician communication and completion of prescribed/recommended actions by midnight of the next day:  Yes and resolved   Name of provider notified for urgent issues identified:  Provider Method of Notification: Secure message   Pharmacist comments:    Time spent performing this drug regimen review (minutes):  Nicole Cella, [place] Pharmacist  03/04/2022

## 2022-03-04 NOTE — Progress Notes (Signed)
Hypoglycemic Event  CBG: 56  Treatment: 4 oz juice/soda  Symptoms: None  Follow-up CBG: Time:0630 CBG Result:97  Possible Reasons for Event: Unknown  Comments/MD notified:MD notified and ordered to continue to monitor.    [place]

## 2022-03-21 ENCOUNTER — Encounter: Payer: 59 | Attending: Physical Medicine and Rehabilitation | Admitting: Physical Medicine and Rehabilitation

## 2022-04-07 ENCOUNTER — Emergency Department (HOSPITAL_COMMUNITY)
Admission: EM | Admit: 2022-04-07 | Discharge: 2022-04-08 | Disposition: A | Discharge status: Home / Self Care | Payer: No Typology Code available for payment source | Attending: Emergency Medicine | Admitting: Emergency Medicine

## 2022-04-07 ENCOUNTER — Other Ambulatory Visit: Payer: Self-pay

## 2022-04-07 ENCOUNTER — Encounter (HOSPITAL_COMMUNITY): Payer: Self-pay

## 2022-04-07 ENCOUNTER — Emergency Department (HOSPITAL_COMMUNITY): Payer: No Typology Code available for payment source

## 2022-04-07 DIAGNOSIS — I160 Hypertensive urgency: Secondary | ICD-10-CM | POA: Diagnosis not present

## 2022-04-07 DIAGNOSIS — Z7982 Long term (current) use of aspirin: Secondary | ICD-10-CM | POA: Insufficient documentation

## 2022-04-07 DIAGNOSIS — R748 Abnormal levels of other serum enzymes: Secondary | ICD-10-CM | POA: Insufficient documentation

## 2022-04-07 DIAGNOSIS — Z79899 Other long term (current) drug therapy: Secondary | ICD-10-CM | POA: Diagnosis not present

## 2022-04-07 DIAGNOSIS — Z7902 Long term (current) use of antithrombotics/antiplatelets: Secondary | ICD-10-CM | POA: Insufficient documentation

## 2022-04-07 DIAGNOSIS — Z794 Long term (current) use of insulin: Secondary | ICD-10-CM | POA: Diagnosis not present

## 2022-04-07 DIAGNOSIS — E1140 Type 2 diabetes mellitus with diabetic neuropathy, unspecified: Secondary | ICD-10-CM | POA: Diagnosis not present

## 2022-04-07 DIAGNOSIS — Z8673 Personal history of transient ischemic attack (TIA), and cerebral infarction without residual deficits: Secondary | ICD-10-CM | POA: Insufficient documentation

## 2022-04-07 DIAGNOSIS — Z7984 Long term (current) use of oral hypoglycemic drugs: Secondary | ICD-10-CM | POA: Diagnosis not present

## 2022-04-07 DIAGNOSIS — R1012 Left upper quadrant pain: Secondary | ICD-10-CM | POA: Diagnosis present

## 2022-04-07 DIAGNOSIS — D649 Anemia, unspecified: Secondary | ICD-10-CM | POA: Diagnosis not present

## 2022-04-07 DIAGNOSIS — E1165 Type 2 diabetes mellitus with hyperglycemia: Secondary | ICD-10-CM | POA: Diagnosis not present

## 2022-04-07 LAB — URINALYSIS, ROUTINE W REFLEX MICROSCOPIC
Bacteria, UA: NONE SEEN
Bilirubin Urine: NEGATIVE
Glucose, UA: >=500 mg/dL — AB
Hgb urine dipstick: NEGATIVE
Ketones, ur: NEGATIVE mg/dL
Leukocytes,Ua: NEGATIVE
Nitrite: NEGATIVE
Protein, ur: 30 mg/dL — AB
Specific Gravity, Urine: 1.018 (ref 1.005–1.030)
pH: 6 (ref 5.0–8.0)

## 2022-04-07 LAB — CBC WITH DIFFERENTIAL/PLATELET
Abs Immature Granulocytes: 0.03 10*3/uL (ref 0.00–0.07)
Basophils Absolute: 0 10*3/uL (ref 0.0–0.1)
Basophils Relative: 0 %
Eosinophils Absolute: 0 10*3/uL (ref 0.0–0.5)
Eosinophils Relative: 0 %
HCT: 34.1 % — ABNORMAL LOW (ref 39.0–52.0)
Hemoglobin: 11 g/dL — ABNORMAL LOW (ref 13.0–17.0)
Immature Granulocytes: 1 %
Lymphocytes Relative: 29 %
Lymphs Abs: 1.8 10*3/uL (ref 0.7–4.0)
MCH: 28.5 pg (ref 26.0–34.0)
MCHC: 32.3 g/dL (ref 30.0–36.0)
MCV: 88.3 fL (ref 80.0–100.0)
Monocytes Absolute: 0.5 10*3/uL (ref 0.1–1.0)
Monocytes Relative: 7 %
Neutro Abs: 3.9 10*3/uL (ref 1.7–7.7)
Neutrophils Relative %: 63 %
Platelets: 209 10*3/uL (ref 150–400)
RBC: 3.86 MIL/uL — ABNORMAL LOW (ref 4.22–5.81)
RDW: 12.7 % (ref 11.5–15.5)
WBC: 6.2 10*3/uL (ref 4.0–10.5)
nRBC: 0 % (ref 0.0–0.2)

## 2022-04-07 LAB — COMPREHENSIVE METABOLIC PANEL
ALT: 19 U/L (ref 0–44)
AST: 19 U/L (ref 15–41)
Albumin: 3.6 g/dL (ref 3.5–5.0)
Alkaline Phosphatase: 52 U/L (ref 38–126)
Anion gap: 8 (ref 5–15)
BUN: 24 mg/dL — ABNORMAL HIGH (ref 8–23)
CO2: 23 mmol/L (ref 22–32)
Calcium: 9 mg/dL (ref 8.9–10.3)
Chloride: 101 mmol/L (ref 98–111)
Creatinine, Ser: 0.76 mg/dL (ref 0.61–1.24)
GFR, Estimated: >60 mL/min (ref >60)
Glucose, Bld: 279 mg/dL — ABNORMAL HIGH (ref 70–99)
Potassium: 4.2 mmol/L (ref 3.5–5.1)
Sodium: 132 mmol/L — ABNORMAL LOW (ref 135–145)
Total Bilirubin: 0.5 mg/dL (ref 0.3–1.2)
Total Protein: 6.8 g/dL (ref 6.5–8.1)

## 2022-04-07 LAB — LIPASE, BLOOD: Lipase: 135 U/L — ABNORMAL HIGH (ref 11–51)

## 2022-04-07 MED ORDER — IOHEXOL 350 MG/ML SOLN
75.0000 mL | Freq: Once | INTRAVENOUS | Status: AC | PRN
  Administered 2022-04-07: 75 mL via INTRAVENOUS

## 2022-04-07 NOTE — ED Notes (Signed)
Called to inquire on time of CT, advised that there are several ahead of this patient still.

## 2022-04-07 NOTE — ED Triage Notes (Signed)
Pt arrived via [facility] from home for generalized abd pain told EMS forever. Pt denies N/V/D.

## 2022-04-07 NOTE — ED Provider Triage Note (Signed)
Emergency Medicine Provider Triage Evaluation Note  William Acevedo , a 67 y.o. male  was evaluated in triage.  Pt complains of generalized abdominal pain which reportedly began this morning.  Patient seems to be a poor historian.  He denies nausea, vomiting, chest pain, shortness of breath.  He denies any food or drink that seem to trigger his symptoms  Review of Systems  Positive: As above Negative: As above  Physical Exam  BP (!) 152/77   Pulse 73   Temp 98.3 F (36.8 C)   Resp 20   Ht 5\' 6"  (1.676 m)   Wt 48.7 kg   SpO2 100%   BMI 17.33 kg/m  Gen:   Awake, no distress   Resp:  Normal effort  MSK:   Moves extremities without difficulty  Other:  Patient with active guarding with any palpation of his abdomen  Medical Decision Making  Medically screening exam initiated at 8:37 PM.  Appropriate orders placed.  William Acevedo was informed that the remainder of the evaluation will be completed by another provider, this initial triage assessment does not replace that evaluation, and the importance of remaining in the ED until their evaluation is complete.     Ronny Bacon 04/07/22 2038

## 2022-04-08 MED ORDER — ALUM & MAG HYDROXIDE-SIMETH 200-200-20 MG/5ML PO SUSP
30.0000 mL | Freq: Once | ORAL | Status: AC
  Administered 2022-04-08: 30 mL via ORAL
  Filled 2022-04-08: qty 30

## 2022-04-08 MED ORDER — KETOROLAC TROMETHAMINE 15 MG/ML IJ SOLN
15.0000 mg | Freq: Once | INTRAMUSCULAR | Status: AC
  Administered 2022-04-08: 15 mg via INTRAVENOUS
  Filled 2022-04-08: qty 1

## 2022-04-08 MED ORDER — ONDANSETRON 4 MG PO TBDP
4.0000 mg | ORAL_TABLET | Freq: Once | ORAL | Status: AC
  Administered 2022-04-08: 4 mg via ORAL
  Filled 2022-04-08: qty 1

## 2022-04-08 MED ORDER — LIDOCAINE VISCOUS HCL 2 % MT SOLN
15.0000 mL | Freq: Once | OROMUCOSAL | Status: AC
  Administered 2022-04-08: 15 mL via ORAL
  Filled 2022-04-08: qty 15

## 2022-04-08 NOTE — ED Provider Notes (Signed)
[facility] Provider Note  CSN: [id number] Arrival date & time: 04/07/22 1901  Chief Complaint(s) Abdominal Pain  HPI William Acevedo is a 67 y.o. male with a past medical history listed below who presents to the emergency department for abdominal discomfort.  He reports that the pain is located near his surgical scars.  He denies any nausea or vomiting.  No diarrhea.  No alleviating or aggravating factors.  Patient admits to drinking alcohol earlier today.  Denies any other physical complaints.  The history is provided by the patient.    Past Medical History Past Medical History:  Diagnosis Date   Carpal tunnel syndrome    Cocaine use    Depression    Diabetes mellitus without complication ([place])    Endocarditis    ETOH abuse    Hypertensive urgency 04/14/2020   Neuropathy    PTSD (post-traumatic stress disorder)    Stroke (cerebrum) (HCC)    multiple   Zoster    Patient Active Problem List   Diagnosis Date Noted   Schizoaffective disorder ([place]) 11/10/2021   Alcohol use, unspecified with intoxication, unspecified ([place]) 11/10/2021   Cocaine abuse with intoxication, unspecified ([place]) 11/10/2021   Antisocial personality disorder ([place]) 11/10/2021   Cannabis dependence, continuous abuse ([place]) 11/10/2021   Opioid abuse ([place]) 11/10/2021   Post-traumatic stress disorder, chronic 11/10/2021   Schizophrenia, unspecified ([place]) 11/10/2021   CVA (cerebral vascular accident) ([place]) 07/09/2020   Acute ischemic multifocal multiple vascular territories stroke ([place]) 06/06/2020   Seizure-like activity ([place]) 06/06/2020   Cocaine abuse ([place])    ARF (acute renal failure) ([place]) 04/14/2020   Acute blood loss anemia 04/14/2020   GI bleed 04/14/2020   Nausea & vomiting [id number]   Acute metabolic encephalopathy [id number]   Hypertensive urgency 04/14/2020   Abdominal pain 04/02/2020   Protein-calorie malnutrition, severe 03/27/2020   Cerebral  thrombosis with cerebral infarction 03/26/2020   NSTEMI (non-ST elevated myocardial infarction) ([place]) 03/25/2020   Secondary diabetes mellitus with HHNC (hyperglycemia hyperosmolar non-ketotic coma) ([place]) 03/25/2020   Elevated troponin    Renal infarction [facility])    Cerebrovascular accident (CVA) ([place])    Cocaine use disorder, severe, dependence ([place]) 04/08/2019   Major depressive disorder 04/08/2019   Epiglottitis 04/07/2019   Cocaine use 04/07/2019   Acute encephalopathy 04/07/2019   Overdose 07/06/2015   AKI (acute kidney injury) ([place]) 07/06/2015   Polysubstance abuse ([place]) 07/06/2015   DM2 (diabetes mellitus, type 2) ([place]) 07/06/2015   Dehydration    MDD (major depressive disorder), recurrent episode, mild ([place])    Alcohol use disorder    Home Medication(s) Prior to Admission medications   Medication Sig Start Date End Date Taking? Authorizing Provider  ACCU-CHEK GUIDE test strip  10/29/21   [provider]  acetaminophen (TYLENOL) 325 MG tablet Take 1-2 tablets (325-650 mg total) by mouth every 4 (four) hours as needed for mild pain. 03/04/22   Love, Ivan Anchors, PA-C  aspirin EC 81 MG tablet Take 1 tablet (81 mg total) by mouth daily. Swallow whole. 03/04/22   Love, Ivan Anchors, PA-C  atorvastatin (LIPITOR) 80 MG tablet Take 1 tablet (80 mg total) by mouth daily. 03/04/22   Love, Ivan Anchors, PA-C  blood glucose meter kit and supplies KIT Dispense based on patient and insurance preference. Use up to four times daily as directed. (FOR ICD-9 250.00, 250.01). 09/13/19   Isla Pence, MD  clopidogrel (PLAVIX) 75 MG tablet Take 1 tablet (75 mg total)  by mouth daily. 03/04/22   Love, Ivan Anchors, PA-C  DULoxetine (CYMBALTA) 60 MG capsule Take 1 capsule (60 mg total) by mouth daily. For chronic pain and mood 03/04/22   Love, Ivan Anchors, PA-C  famotidine (PEPCID) 20 MG tablet Take 1 tablet (20 mg total) by mouth daily before breakfast. 03/04/22   Love, Ivan Anchors, PA-C  FARXIGA 10 MG TABS tablet Take  1 tablet (10 mg total) by mouth daily. 03/04/22   Love, Ivan Anchors, PA-C  gabapentin (NEURONTIN) 300 MG capsule Take 1 capsule (300 mg total) by mouth 2 (two) times daily. 03/04/22   Love, Ivan Anchors, PA-C  insulin glargine (LANTUS) 100 UNIT/ML Solostar Pen Inject 5 Units into the skin daily. 03/04/22   Love, Ivan Anchors, PA-C  Insulin Pen Needle (PEN NEEDLES) 31G X 5 MM MISC 1 Application by Does not apply route daily at 6 (six) AM. 03/04/22   Love, Ivan Anchors, PA-C  l-methylfolate-B6-B12 (METANX) 3-35-2 MG TABS tablet Take 1 tablet by mouth daily. 03/04/22   Love, Ivan Anchors, PA-C  magnesium gluconate (MAGONATE) 500 MG tablet Take 1 tablet (500 mg total) by mouth at bedtime. 03/04/22   Love, Ivan Anchors, PA-C  melatonin 3 MG TABS tablet Take 2 tablets (6 mg total) by mouth at bedtime as needed (for sleep). 03/04/22   Love, Ivan Anchors, PA-C  metFORMIN (GLUCOPHAGE) 1000 MG tablet Take 1 tablet (1,000 mg total) by mouth 2 (two) times daily with a meal. 03/04/22   Love, Ivan Anchors, PA-C  Multiple Vitamin (MULTIVITAMIN WITH MINERALS) TABS tablet Take 1 tablet by mouth daily. 03/04/22   Love, Ivan Anchors, PA-C  OLANZapine (ZYPREXA) 5 MG tablet Take 1 tablet (5 mg total) by mouth at bedtime. 03/04/22   Love, Ivan Anchors, PA-C  senna-docusate (SENOKOT-S) 8.6-50 MG tablet Take 1 tablet by mouth at bedtime as needed for mild constipation. 03/04/22   Love, Ivan Anchors, PA-C  traZODone (DESYREL) 100 MG tablet Take 1 tablet (100 mg total) by mouth at bedtime. 03/04/22   Love, Ivan Anchors, PA-C                                                                                                                                    Allergies Lisinopril and Omeprazole  Review of Systems Review of Systems As noted in HPI  Physical Exam Vital Signs  I have reviewed the triage vital signs BP (!) 174/91 (BP Location: Left Arm)   Pulse 73   Temp 98.2 F (36.8 C) (Oral)   Resp 18   Ht 5\' 6"  (1.676 m)   Wt 48.7 kg   SpO2 100%   BMI 17.33 kg/m    Physical Exam Vitals reviewed.  Constitutional:      General: He is not in acute distress.    Appearance: He is well-developed. He is not diaphoretic.  HENT:     Head: Normocephalic and atraumatic.  Right Ear: External ear normal.     Left Ear: External ear normal.     Nose: Nose normal.     Mouth/Throat:     Mouth: Mucous membranes are moist.  Eyes:     General: No scleral icterus.    Conjunctiva/sclera: Conjunctivae normal.  Neck:     Trachea: Phonation normal.  Cardiovascular:     Rate and Rhythm: Normal rate and regular rhythm.  Pulmonary:     Effort: Pulmonary effort is normal. No respiratory distress.     Breath sounds: No stridor.  Abdominal:     General: There is no distension.     Tenderness: There is no abdominal tenderness. There is no guarding or rebound.    Musculoskeletal:        General: Normal range of motion.     Cervical back: Normal range of motion.  Neurological:     Mental Status: He is alert and oriented to person, place, and time.  Psychiatric:        Behavior: Behavior normal.     ED Results and Treatments Labs (all labs ordered are listed, but only abnormal results are displayed) Labs Reviewed  COMPREHENSIVE METABOLIC PANEL - Abnormal; Notable for the following components:      Result Value   Sodium 132 (*)    Glucose, Bld 279 (*)    BUN 24 (*)    All other components within normal limits  LIPASE, BLOOD - Abnormal; Notable for the following components:   Lipase 135 (*)    All other components within normal limits  CBC WITH DIFFERENTIAL/PLATELET - Abnormal; Notable for the following components:   RBC 3.86 (*)    Hemoglobin 11.0 (*)    HCT 34.1 (*)    All other components within normal limits  URINALYSIS, ROUTINE W REFLEX MICROSCOPIC - Abnormal; Notable for the following components:   Glucose, UA >=500 (*)    Protein, ur 30 (*)    All other components within normal limits                                                                                                                          EKG  EKG Interpretation  Date/Time:    Ventricular Rate:    PR Interval:    QRS Duration:   QT Interval:    QTC Calculation:   R Axis:     Text Interpretation:         Radiology CT Abdomen Pelvis W Contrast  Result Date: 04/07/2022 CLINICAL DATA:  Abdominal pain, acute, nonlocalized EXAM: CT ABDOMEN AND PELVIS WITH CONTRAST TECHNIQUE: Multidetector CT imaging of the abdomen and pelvis was performed using the standard protocol following bolus administration of intravenous contrast. RADIATION DOSE REDUCTION: This exam was performed according to the departmental dose-optimization program which includes automated exposure control, adjustment of the mA and/or kV according to patient size and/or use of iterative reconstruction technique. CONTRAST:  38mL OMNIPAQUE IOHEXOL 350 MG/ML SOLN COMPARISON:  02/17/2022 FINDINGS: Lower chest: Multiple peripheral noncalcified pulmonary nodules and linear scarring are seen within the a visualized basilar right middle lobe, possibly the residua of the inflammatory process seen on CT examination of 04/04/2021. These are not fully included on this examination. Extensive coronary artery calcification. Global cardiac size within normal limits. No acute abnormality. Hepatobiliary: No focal liver abnormality is seen. No gallstones, gallbladder wall thickening, or biliary dilatation. Pancreas: Unremarkable Spleen: Unremarkable Adrenals/Urinary Tract: The adrenal glands are unremarkable. The kidneys are normal in size and position. Scattered bilateral renal cortical scarring noted. 2 mm nonobstructing calculus noted within the lower pole the right kidney. Multiple simple cortical cysts are seen within the right kidney for which no follow-up imaging is recommended. No hydronephrosis. No intrarenal or ureteral calculi. The bladder is largely decompressed. However, there has developed circumferential bladder wall  thickening suggesting a superimposed infectious or inflammatory cystitis. No perivesicular fluid collection is identified. Stomach/Bowel: Stomach is within normal limits. Appendix appears normal. No evidence of bowel wall thickening, distention, or inflammatory changes. Vascular/Lymphatic: There is extensive aortoiliac atherosclerotic calcification with particularly prominent calcification is seen at the origin of the mesenteric, renal, and common iliac arteries. Hemodynamically significant stenoses are suspected, though not optimally assessed on this non arteriographic study. No aortic aneurysm. No pathologic adenopathy within the abdomen and pelvis. Reproductive: Mild prostatic hypertrophy. Other: No abdominal wall hernia or abnormality. No abdominopelvic ascites. Musculoskeletal: Osseous structures are age-appropriate. No acute bone abnormality. No lytic or blastic bone lesion. IMPRESSION: 1. New circumferential bladder wall thickening suggesting a superimposed infectious or inflammatory cystitis. Correlation with urinalysis and urine culture may be helpful. 2. Minimal right nonobstructing nephrolithiasis. No urolithiasis. No hydronephrosis. 3. Extensive coronary artery calcification. 4. Peripheral vascular disease with particularly prominent calcification at the origin of the mesenteric, renal, and common iliac arteries. Clinical correlation for signs and symptoms of chronic mesenteric ischemia may be helpful in this patient with abdominal pain. If present, CT arteriography may be helpful for further evaluation. 5. Multiple peripheral noncalcified pulmonary nodules and linear scarring within the visualized basilar right middle lobe, possibly the residua of the inflammatory process seen on CT examination of 04/04/2021. These are not fully included on this examination. If indicated, this could be better assessed with dedicated CT imaging of the chest. Aortic Atherosclerosis (ICD10-I70.0). Electronically Signed    By: Fidela Salisbury M.D.   On: 04/07/2022 23:56    Medications Ordered in ED Medications  iohexol (OMNIPAQUE) 350 MG/ML injection 75 mL (75 mLs Intravenous Contrast Given 04/07/22 2346)  alum & mag hydroxide-simeth (MAALOX/MYLANTA) 200-200-20 MG/5ML suspension 30 mL (30 mLs Oral Given 04/08/22 0039)    And  lidocaine (XYLOCAINE) 2 % viscous mouth solution 15 mL (15 mLs Oral Given 04/08/22 0039)  ondansetron (ZOFRAN-ODT) disintegrating tablet 4 mg (4 mg Oral Given 04/08/22 0041)  ketorolac (TORADOL) 15 MG/ML injection 15 mg (15 mg Intravenous Given 04/08/22 0040)  Procedures Procedures  (including critical care time)  Medical Decision Making / ED Course  Click here for ABCD2, HEART and other calculators  Medical Decision Making Amount and/or Complexity of Data Reviewed Labs: ordered. Decision-making details documented in ED Course. Radiology: ordered and independent interpretation performed. Decision-making details documented in ED Course.  Risk OTC drugs. Prescription drug management.    This patient presents to the ED for concern of abd pain, this involves an extensive number of treatment options, and is a complaint that carries with it a high risk of complications and morbidity. The differential diagnosis includes but not limited to pancreatitis, biliary disease, gastroparesis, gastritis.  Will assess for any electrolyte or metabolic derangements.  Will rule out DKA.  CBC without leukocytosis.  Mild anemia with stable hemoglobin Metabolic panel without significant electrolyte derangements.  Hyperglycemia without DKA.  No renal insufficiency.  No evidence of bili obstruction.  Patient has elevated lipase at 135 but no epigastric tenderness concerning for pancreatitis.  CT scan was negative for evidence of pancreatitis or other serious intra-abdominal  inflammatory/infectious process or bowel obstruction.  Question of oral bladder wall thickening but UA without evidence of infection.  Patient provided with GI cocktail which completely resolved his symptoms.  He is tolerating p.o.       Final Clinical Impression(s) / ED Diagnoses Final diagnoses:  Left upper quadrant abdominal pain   The patient appears reasonably screened and/or stabilized for discharge and I doubt any other medical condition or other [facility] requiring further screening, evaluation, or treatment in the ED at this time. I have discussed the findings, Dx and Tx plan with the patient/family who expressed understanding and agree(s) with the plan. Discharge instructions discussed at length. The patient/family was given strict return precautions who verbalized understanding of the instructions. No further questions at time of discharge.  Disposition: Discharge  Condition: Good  ED Discharge Orders     None        Follow Up: [address], [place] [address] [phone]  Call  to schedule an appointment for close follow up            This chart was dictated using voice recognition software.  Despite best efforts to proofread,  errors can occur which can change the documentation meaning.    Fatima Blank, MD 04/08/22 [phone]

## 2022-06-26 ENCOUNTER — Encounter (HOSPITAL_COMMUNITY): Payer: Self-pay

## 2022-06-26 ENCOUNTER — Other Ambulatory Visit: Payer: Self-pay

## 2022-06-26 ENCOUNTER — Emergency Department (HOSPITAL_COMMUNITY)
Admission: EM | Admit: 2022-06-26 | Discharge: 2022-06-26 | Disposition: A | Discharge status: Home / Self Care | Payer: 59 | Attending: Emergency Medicine | Admitting: Emergency Medicine

## 2022-06-26 DIAGNOSIS — Z7984 Long term (current) use of oral hypoglycemic drugs: Secondary | ICD-10-CM | POA: Diagnosis not present

## 2022-06-26 DIAGNOSIS — Z7982 Long term (current) use of aspirin: Secondary | ICD-10-CM | POA: Diagnosis not present

## 2022-06-26 DIAGNOSIS — Z7901 Long term (current) use of anticoagulants: Secondary | ICD-10-CM | POA: Insufficient documentation

## 2022-06-26 DIAGNOSIS — R4182 Altered mental status, unspecified: Secondary | ICD-10-CM | POA: Diagnosis not present

## 2022-06-26 DIAGNOSIS — E1165 Type 2 diabetes mellitus with hyperglycemia: Secondary | ICD-10-CM | POA: Diagnosis not present

## 2022-06-26 DIAGNOSIS — R739 Hyperglycemia, unspecified: Secondary | ICD-10-CM

## 2022-06-26 DIAGNOSIS — Z794 Long term (current) use of insulin: Secondary | ICD-10-CM | POA: Diagnosis not present

## 2022-06-26 LAB — CBG MONITORING, ED
Glucose-Capillary: 534 mg/dL (ref 70–99)
Glucose-Capillary: 323 mg/dL — ABNORMAL HIGH (ref 70–99)
Glucose-Capillary: 170 mg/dL — ABNORMAL HIGH (ref 70–99)

## 2022-06-26 LAB — RAPID URINE DRUG SCREEN, HOSP PERFORMED
Amphetamines: NOT DETECTED
Barbiturates: NOT DETECTED
Benzodiazepines: POSITIVE — AB
Cocaine: NOT DETECTED
Opiates: NOT DETECTED
Tetrahydrocannabinol: NOT DETECTED

## 2022-06-26 LAB — CBC
HCT: 38.8 % — ABNORMAL LOW (ref 39.0–52.0)
Hemoglobin: 12.5 g/dL — ABNORMAL LOW (ref 13.0–17.0)
MCH: 28 pg (ref 26.0–34.0)
MCHC: 32.2 g/dL (ref 30.0–36.0)
MCV: 86.8 fL (ref 80.0–100.0)
Platelets: 228 10*3/uL (ref 150–400)
RBC: 4.47 MIL/uL (ref 4.22–5.81)
RDW: 11.8 % (ref 11.5–15.5)
WBC: 5.5 10*3/uL (ref 4.0–10.5)
nRBC: 0 % (ref 0.0–0.2)

## 2022-06-26 LAB — I-STAT VENOUS BLOOD GAS, ED
Acid-Base Excess: 1 mmol/L (ref 0.0–2.0)
Bicarbonate: 28.5 mmol/L — ABNORMAL HIGH (ref 20.0–28.0)
Calcium, Ion: 1.17 mmol/L (ref 1.15–1.40)
HCT: 38 % — ABNORMAL LOW (ref 39.0–52.0)
Hemoglobin: 12.9 g/dL — ABNORMAL LOW (ref 13.0–17.0)
O2 Saturation: 97 %
Potassium: 4.5 mmol/L (ref 3.5–5.1)
Sodium: 130 mmol/L — ABNORMAL LOW (ref 135–145)
TCO2: 30 mmol/L (ref 22–32)
pCO2, Ven: 56.1 mmHg (ref 44–60)
pH, Ven: 7.314 (ref 7.25–7.43)
pO2, Ven: 105 mmHg — ABNORMAL HIGH (ref 32–45)

## 2022-06-26 LAB — COMPREHENSIVE METABOLIC PANEL
ALT: 22 U/L (ref 0–44)
AST: 28 U/L (ref 15–41)
Albumin: 3.5 g/dL (ref 3.5–5.0)
Alkaline Phosphatase: 65 U/L (ref 38–126)
Anion gap: 10 (ref 5–15)
BUN: 41 mg/dL — ABNORMAL HIGH (ref 8–23)
CO2: 24 mmol/L (ref 22–32)
Calcium: 9.2 mg/dL (ref 8.9–10.3)
Chloride: 95 mmol/L — ABNORMAL LOW (ref 98–111)
Creatinine, Ser: 1.35 mg/dL — ABNORMAL HIGH (ref 0.61–1.24)
GFR, Estimated: 57 mL/min — ABNORMAL LOW (ref >60)
Glucose, Bld: 496 mg/dL — ABNORMAL HIGH (ref 70–99)
Potassium: 4.3 mmol/L (ref 3.5–5.1)
Sodium: 129 mmol/L — ABNORMAL LOW (ref 135–145)
Total Bilirubin: 0.3 mg/dL (ref 0.3–1.2)
Total Protein: 6.9 g/dL (ref 6.5–8.1)

## 2022-06-26 LAB — URINALYSIS, ROUTINE W REFLEX MICROSCOPIC
Bacteria, UA: NONE SEEN
Bilirubin Urine: NEGATIVE
Glucose, UA: >=500 mg/dL — AB
Hgb urine dipstick: NEGATIVE
Ketones, ur: NEGATIVE mg/dL
Nitrite: NEGATIVE
Protein, ur: NEGATIVE mg/dL
Specific Gravity, Urine: 1.012 (ref 1.005–1.030)
pH: 6 (ref 5.0–8.0)

## 2022-06-26 LAB — BETA-HYDROXYBUTYRIC ACID: Beta-Hydroxybutyric Acid: 0.48 mmol/L — ABNORMAL HIGH (ref 0.05–0.27)

## 2022-06-26 MED ORDER — INSULIN ASPART 100 UNIT/ML IJ SOLN
6.0000 [IU] | Freq: Once | INTRAMUSCULAR | Status: AC
  Administered 2022-06-26: 6 [IU] via INTRAVENOUS

## 2022-06-26 MED ORDER — SODIUM CHLORIDE 0.9 % IV BOLUS
1000.0000 mL | Freq: Once | INTRAVENOUS | Status: AC
  Administered 2022-06-26: 1000 mL via INTRAVENOUS

## 2022-06-26 NOTE — ED Notes (Signed)
Talked with pt's brother, states that he will come get him in about an hour.

## 2022-06-26 NOTE — ED Triage Notes (Signed)
Pt to er room number 33 via ems, per ems pt is here for an elevated blood sugar, states that it was elevated yesterday and pt lives at home with his brother, states that he was able to give some insulin today, but when pt's blood sugar is elevated he sometimes gets combative and confused, states that pt is here for confusion and agitation.    Pt in bed, pt awake and oriented to self, pt doesn't know when and where he is, re oriented pt.

## 2022-06-26 NOTE — ED Notes (Signed)
Pt sitting up in bed, pt has removed all monitor leads and pulled his iv out.  Explained to pt that he was waiting for his brother to come get him and take him home.  Pt moved to er room number 39 in front of nurses station while he is waiting for his ride.  Report to yellow rn

## 2022-06-26 NOTE — ED Provider Notes (Signed)
[place] EMERGENCY DEPARTMENT AT [facility] Provider Note   CSN: [id number] Arrival date & time: 06/26/22  1016     History {Add pertinent medical, surgical, social history, OB history to HPI:1} Chief Complaint  Patient presents with   Altered Mental Status    William Acevedo is a 68 y.o. male with past medical history significant for diabetes, depression, EtOH abuse, cocaine abuse, previous endocarditis who presents with concern for elevated blood sugar, combative, confused behavior.  Brother states that he was able to get some insulin today, he denies any chest pain, abdominal pain, nausea, vomiting.  He is alert and oriented to himself, place, somewhat confused on situation, and oriented to year but not date.  Collateral from mother reports that patient has had a general decline in in his functional status, is generally medication noncompliant, and has difficulty taking care of himself at home.  He wants to talk to primary care doctor about getting him placed in a skilled nursing facility.  He has reportedly been drug-free since January.   Altered Mental Status      Home Medications Prior to Admission medications   Medication Sig Start Date End Date Taking? Authorizing Provider  ACCU-CHEK GUIDE test strip  10/29/21   [provider]  acetaminophen (TYLENOL) 325 MG tablet Take 1-2 tablets (325-650 mg total) by mouth every 4 (four) hours as needed for mild pain. 03/04/22   Love, Evlyn Kanner, PA-C  aspirin EC 81 MG tablet Take 1 tablet (81 mg total) by mouth daily. Swallow whole. 03/04/22   Love, Evlyn Kanner, PA-C  atorvastatin (LIPITOR) 80 MG tablet Take 1 tablet (80 mg total) by mouth daily. 03/04/22   Love, Evlyn Kanner, PA-C  blood glucose meter kit and supplies KIT Dispense based on patient and insurance preference. Use up to four times daily as directed. (FOR ICD-9 250.00, 250.01). 09/13/19   [name], MD  clopidogrel (PLAVIX) 75 MG tablet Take 1 tablet (75 mg  total) by mouth daily. 03/04/22   Love, Evlyn Kanner, PA-C  DULoxetine (CYMBALTA) 60 MG capsule Take 1 capsule (60 mg total) by mouth daily. For chronic pain and mood 03/04/22   Love, Evlyn Kanner, PA-C  famotidine (PEPCID) 20 MG tablet Take 1 tablet (20 mg total) by mouth daily before breakfast. 03/04/22   Love, Evlyn Kanner, PA-C  FARXIGA 10 MG TABS tablet Take 1 tablet (10 mg total) by mouth daily. 03/04/22   Love, Evlyn Kanner, PA-C  gabapentin (NEURONTIN) 300 MG capsule Take 1 capsule (300 mg total) by mouth 2 (two) times daily. 03/04/22   Love, Evlyn Kanner, PA-C  insulin glargine (LANTUS) 100 UNIT/ML Solostar Pen Inject 5 Units into the skin daily. 03/04/22   Love, Evlyn Kanner, PA-C  Insulin Pen Needle (PEN NEEDLES) 31G X 5 MM MISC 1 Application by Does not apply route daily at 6 (six) AM. 03/04/22   Love, Evlyn Kanner, PA-C  l-methylfolate-B6-B12 (METANX) 3-35-2 MG TABS tablet Take 1 tablet by mouth daily. 03/04/22   Love, Evlyn Kanner, PA-C  magnesium gluconate (MAGONATE) 500 MG tablet Take 1 tablet (500 mg total) by mouth at bedtime. 03/04/22   Love, Evlyn Kanner, PA-C  melatonin 3 MG TABS tablet Take 2 tablets (6 mg total) by mouth at bedtime as needed (for sleep). 03/04/22   Love, Evlyn Kanner, PA-C  metFORMIN (GLUCOPHAGE) 1000 MG tablet Take 1 tablet (1,000 mg total) by mouth 2 (two) times daily with a meal. 03/04/22   Love, Evlyn Kanner, PA-C  Multiple Vitamin (MULTIVITAMIN WITH MINERALS) TABS tablet Take 1 tablet by mouth daily. 03/04/22   Love, Evlyn Kanner, PA-C  OLANZapine (ZYPREXA) 5 MG tablet Take 1 tablet (5 mg total) by mouth at bedtime. 03/04/22   Love, Evlyn Kanner, PA-C  senna-docusate (SENOKOT-S) 8.6-50 MG tablet Take 1 tablet by mouth at bedtime as needed for mild constipation. 03/04/22   Love, Evlyn Kanner, PA-C  traZODone (DESYREL) 100 MG tablet Take 1 tablet (100 mg total) by mouth at bedtime. 03/04/22   Love, Evlyn Kanner, PA-C      Allergies    Lisinopril and Omeprazole    Review of Systems   Review of Systems  All other systems  reviewed and are negative.   Physical Exam Updated Vital Signs BP (!) 144/80 (BP Location: Right Arm)   Pulse 68   Temp 98 F (36.7 C) (Oral)   Resp 17   Ht 5\' 6"  (1.676 m)   Wt 63.5 kg   SpO2 100%   BMI 22.60 kg/m  Physical Exam Vitals and nursing note reviewed.  Constitutional:      General: He is not in acute distress.    Appearance: Normal appearance.  HENT:     Head: Normocephalic and atraumatic.  Eyes:     General:        Right eye: No discharge.        Left eye: No discharge.  Cardiovascular:     Rate and Rhythm: Normal rate and regular rhythm.     Heart sounds: No murmur heard.    No friction rub. No gallop.  Pulmonary:     Effort: Pulmonary effort is normal.     Breath sounds: Normal breath sounds.  Abdominal:     General: Bowel sounds are normal.     Palpations: Abdomen is soft.  Skin:    General: Skin is warm and dry.     Capillary Refill: Capillary refill takes less than 2 seconds.  Neurological:     Mental Status: He is alert and oriented to person, place, and time.     Comments: Cranial nerves II through XII grossly intact.  Intact finger-nose, intact heel-to-shin.  Romberg negative, gait normal.  Alert and oriented x3.  Moves all 4 limbs spontaneously, normal coordination.  No pronator drift.  Intact strength 5 out of 5 bilateral upper and lower extremities.  Initially somewhat confused about time, situation but on reevaluation patient with improvement of his mental status.  Psychiatric:        Mood and Affect: Mood normal.        Behavior: Behavior normal.     ED Results / Procedures / Treatments   Labs (all labs ordered are listed, but only abnormal results are displayed) Labs Reviewed  CBG MONITORING, ED - Abnormal; Notable for the following components:      Result Value   Glucose-Capillary 534 (*)    All other components within normal limits    EKG None  Radiology No results found.  Procedures Procedures  {Document cardiac  monitor, telemetry assessment procedure when appropriate:1}  Medications Ordered in ED Medications - No data to display  ED Course/ Medical Decision Making/ A&P   {   Click here for ABCD2, HEART and other calculatorsREFRESH Note before signing :1}                          Medical Decision Making Amount and/or Complexity of Data Reviewed Labs: ordered.  Risk Prescription  drug management.   This patient is a 67 y.o. male who presents to the ED for concern of ***, this involves an extensive number of treatment options, and is a complaint that carries with it a high risk of complications and morbidity. The emergent differential diagnosis prior to evaluation includes, but is not limited to,  *** . This is not an exhaustive differential.   Past Medical History / Co-morbidities / Social History: ***  Additional history: Chart reviewed. Pertinent results include: ***  Physical Exam: Physical exam performed. The pertinent findings include: ***  Lab Tests: I ordered, and personally interpreted labs.  The pertinent results include:  ***   Imaging Studies: I ordered imaging studies including ***. I independently visualized and interpreted imaging which showed ***. I agree with the radiologist interpretation.   Cardiac Monitoring:  The patient was maintained on a cardiac monitor.  My attending physician Dr. [name] viewed and interpreted the cardiac monitored which showed an underlying rhythm of: ***. I agree with this interpretation.   Medications: I ordered medication including ***  for ***. Reevaluation of the patient after these medicines showed that the patient {resolved/improved/worsened:23923::"improved"}. I have reviewed the patients home medicines and have made adjustments as needed.  Consultations Obtained: I requested consultation with the ***,  and discussed lab and imaging findings as well as pertinent plan - they recommend: ***   Disposition: After consideration of the  diagnostic results and the patients response to treatment, I feel that *** .   ***emergency department workup does not suggest an emergent condition requiring admission or immediate intervention beyond what has been performed at this time. The plan is: ***. The patient is safe for discharge and has been instructed to return immediately for worsening symptoms, change in symptoms or any other concerns.  I discussed this case with my attending physician Dr. [name] who cosigned this note including patient's presenting symptoms, physical exam, and planned diagnostics and interventions. Attending physician stated agreement with plan or made changes to plan which were implemented.    Final Clinical Impression(s) / ED Diagnoses Final diagnoses:  None    Rx / DC Orders ED Discharge Orders     None

## 2022-06-27 LAB — I-STAT VENOUS BLOOD GAS, ED
Acid-Base Excess: 2 mmol/L (ref 0.0–2.0)
Bicarbonate: 28 mmol/L (ref 20.0–28.0)
Calcium, Ion: 1.14 mmol/L — ABNORMAL LOW (ref 1.15–1.40)
HCT: 39 % (ref 39.0–52.0)
Hemoglobin: 13.3 g/dL (ref 13.0–17.0)
O2 Saturation: 67 %
Potassium: 4.5 mmol/L (ref 3.5–5.1)
Sodium: 130 mmol/L — ABNORMAL LOW (ref 135–145)
TCO2: 30 mmol/L (ref 22–32)
pCO2, Ven: 50.5 mmHg (ref 44–60)
pH, Ven: 7.352 (ref 7.25–7.43)
pO2, Ven: 37 mmHg (ref 32–45)

## 2022-07-16 ENCOUNTER — Encounter (HOSPITAL_COMMUNITY): Payer: Self-pay

## 2022-07-16 ENCOUNTER — Emergency Department (HOSPITAL_COMMUNITY): Payer: 59

## 2022-07-16 ENCOUNTER — Inpatient Hospital Stay (HOSPITAL_COMMUNITY)
Admission: EM | Admit: 2022-07-16 | Discharge: 2022-07-19 | DRG: 698 | Disposition: A | Discharge status: Home Health | Payer: 59 | Attending: Family Medicine | Admitting: Family Medicine

## 2022-07-16 ENCOUNTER — Other Ambulatory Visit: Payer: Self-pay

## 2022-07-16 DIAGNOSIS — E7849 Other hyperlipidemia: Secondary | ICD-10-CM

## 2022-07-16 DIAGNOSIS — Z79899 Other long term (current) drug therapy: Secondary | ICD-10-CM

## 2022-07-16 DIAGNOSIS — E1165 Type 2 diabetes mellitus with hyperglycemia: Secondary | ICD-10-CM | POA: Diagnosis present

## 2022-07-16 DIAGNOSIS — N280 Ischemia and infarction of kidney: Secondary | ICD-10-CM | POA: Diagnosis not present

## 2022-07-16 DIAGNOSIS — N319 Neuromuscular dysfunction of bladder, unspecified: Secondary | ICD-10-CM | POA: Diagnosis present

## 2022-07-16 DIAGNOSIS — R739 Hyperglycemia, unspecified: Secondary | ICD-10-CM

## 2022-07-16 DIAGNOSIS — E785 Hyperlipidemia, unspecified: Secondary | ICD-10-CM | POA: Diagnosis present

## 2022-07-16 DIAGNOSIS — Z888 Allergy status to other drugs, medicaments and biological substances status: Secondary | ICD-10-CM

## 2022-07-16 DIAGNOSIS — R1084 Generalized abdominal pain: Secondary | ICD-10-CM

## 2022-07-16 DIAGNOSIS — E119 Type 2 diabetes mellitus without complications: Secondary | ICD-10-CM

## 2022-07-16 DIAGNOSIS — Z794 Long term (current) use of insulin: Secondary | ICD-10-CM

## 2022-07-16 DIAGNOSIS — I10 Essential (primary) hypertension: Secondary | ICD-10-CM | POA: Diagnosis present

## 2022-07-16 DIAGNOSIS — R918 Other nonspecific abnormal finding of lung field: Secondary | ICD-10-CM | POA: Diagnosis present

## 2022-07-16 DIAGNOSIS — Z7982 Long term (current) use of aspirin: Secondary | ICD-10-CM

## 2022-07-16 DIAGNOSIS — Z1152 Encounter for screening for COVID-19: Secondary | ICD-10-CM

## 2022-07-16 DIAGNOSIS — F17210 Nicotine dependence, cigarettes, uncomplicated: Secondary | ICD-10-CM | POA: Diagnosis present

## 2022-07-16 DIAGNOSIS — I252 Old myocardial infarction: Secondary | ICD-10-CM

## 2022-07-16 DIAGNOSIS — F4310 Post-traumatic stress disorder, unspecified: Secondary | ICD-10-CM | POA: Diagnosis present

## 2022-07-16 DIAGNOSIS — I2510 Atherosclerotic heart disease of native coronary artery without angina pectoris: Secondary | ICD-10-CM | POA: Diagnosis present

## 2022-07-16 DIAGNOSIS — Z7984 Long term (current) use of oral hypoglycemic drugs: Secondary | ICD-10-CM

## 2022-07-16 DIAGNOSIS — R338 Other retention of urine: Secondary | ICD-10-CM

## 2022-07-16 DIAGNOSIS — Z8679 Personal history of other diseases of the circulatory system: Secondary | ICD-10-CM

## 2022-07-16 DIAGNOSIS — R109 Unspecified abdominal pain: Secondary | ICD-10-CM | POA: Diagnosis present

## 2022-07-16 DIAGNOSIS — Z7902 Long term (current) use of antithrombotics/antiplatelets: Secondary | ICD-10-CM

## 2022-07-16 DIAGNOSIS — R339 Retention of urine, unspecified: Secondary | ICD-10-CM | POA: Diagnosis present

## 2022-07-16 DIAGNOSIS — Z8673 Personal history of transient ischemic attack (TIA), and cerebral infarction without residual deficits: Secondary | ICD-10-CM

## 2022-07-16 DIAGNOSIS — E1151 Type 2 diabetes mellitus with diabetic peripheral angiopathy without gangrene: Secondary | ICD-10-CM | POA: Diagnosis present

## 2022-07-16 DIAGNOSIS — F259 Schizoaffective disorder, unspecified: Secondary | ICD-10-CM | POA: Diagnosis present

## 2022-07-16 DIAGNOSIS — R4189 Other symptoms and signs involving cognitive functions and awareness: Secondary | ICD-10-CM

## 2022-07-16 DIAGNOSIS — G9341 Metabolic encephalopathy: Secondary | ICD-10-CM | POA: Diagnosis present

## 2022-07-16 LAB — DIFFERENTIAL
Abs Immature Granulocytes: 0.03 10*3/uL (ref 0.00–0.07)
Basophils Absolute: 0 10*3/uL (ref 0.0–0.1)
Basophils Relative: 0 %
Eosinophils Absolute: 0 10*3/uL (ref 0.0–0.5)
Eosinophils Relative: 0 %
Immature Granulocytes: 0 %
Lymphocytes Relative: 11 %
Lymphs Abs: 0.9 10*3/uL (ref 0.7–4.0)
Monocytes Absolute: 0.4 10*3/uL (ref 0.1–1.0)
Monocytes Relative: 5 %
Neutro Abs: 6.7 10*3/uL (ref 1.7–7.7)
Neutrophils Relative %: 84 %

## 2022-07-16 LAB — BASIC METABOLIC PANEL
Anion gap: 18 — ABNORMAL HIGH (ref 5–15)
Anion gap: 10 (ref 5–15)
BUN: 31 mg/dL — ABNORMAL HIGH (ref 8–23)
BUN: 23 mg/dL (ref 8–23)
CO2: 25 mmol/L (ref 22–32)
CO2: 22 mmol/L (ref 22–32)
Calcium: 10 mg/dL (ref 8.9–10.3)
Calcium: 7.2 mg/dL — ABNORMAL LOW (ref 8.9–10.3)
Chloride: 93 mmol/L — ABNORMAL LOW (ref 98–111)
Chloride: 109 mmol/L (ref 98–111)
Creatinine, Ser: 1.22 mg/dL (ref 0.61–1.24)
Creatinine, Ser: 0.87 mg/dL (ref 0.61–1.24)
GFR, Estimated: >60 mL/min (ref >60)
GFR, Estimated: >60 mL/min (ref >60)
Glucose, Bld: 414 mg/dL — ABNORMAL HIGH (ref 70–99)
Glucose, Bld: 237 mg/dL — ABNORMAL HIGH (ref 70–99)
Potassium: 4.3 mmol/L (ref 3.5–5.1)
Potassium: 3.6 mmol/L (ref 3.5–5.1)
Sodium: 136 mmol/L (ref 135–145)
Sodium: 141 mmol/L (ref 135–145)

## 2022-07-16 LAB — CBC
HCT: 43.4 % (ref 39.0–52.0)
Hemoglobin: 14 g/dL (ref 13.0–17.0)
MCH: 27.9 pg (ref 26.0–34.0)
MCHC: 32.3 g/dL (ref 30.0–36.0)
MCV: 86.5 fL (ref 80.0–100.0)
Platelets: 264 10*3/uL (ref 150–400)
RBC: 5.02 MIL/uL (ref 4.22–5.81)
RDW: 11.9 % (ref 11.5–15.5)
WBC: 7.8 10*3/uL (ref 4.0–10.5)
nRBC: 0 % (ref 0.0–0.2)

## 2022-07-16 LAB — BLOOD GAS, VENOUS
Acid-Base Excess: 2.7 mmol/L — ABNORMAL HIGH (ref 0.0–2.0)
Bicarbonate: 28.9 mmol/L — ABNORMAL HIGH (ref 20.0–28.0)
O2 Saturation: 32.4 %
Patient temperature: 37
pCO2, Ven: 50 mmHg (ref 44–60)
pH, Ven: 7.37 (ref 7.25–7.43)
pO2, Ven: <31 mmHg — CL (ref 32–45)

## 2022-07-16 LAB — BETA-HYDROXYBUTYRIC ACID: Beta-Hydroxybutyric Acid: 2.11 mmol/L — ABNORMAL HIGH (ref 0.05–0.27)

## 2022-07-16 LAB — URINALYSIS, ROUTINE W REFLEX MICROSCOPIC
Bilirubin Urine: NEGATIVE
Glucose, UA: >=500 mg/dL — AB
Ketones, ur: 20 mg/dL — AB
Leukocytes,Ua: NEGATIVE
Nitrite: NEGATIVE
Protein, ur: >=300 mg/dL — AB
Specific Gravity, Urine: 1.03 (ref 1.005–1.030)
pH: 5 (ref 5.0–8.0)

## 2022-07-16 LAB — RAPID URINE DRUG SCREEN, HOSP PERFORMED
Amphetamines: NOT DETECTED
Barbiturates: NOT DETECTED
Benzodiazepines: NOT DETECTED
Cocaine: NOT DETECTED
Opiates: NOT DETECTED
Tetrahydrocannabinol: NOT DETECTED

## 2022-07-16 LAB — CBG MONITORING, ED
Glucose-Capillary: 391 mg/dL — ABNORMAL HIGH (ref 70–99)
Glucose-Capillary: 306 mg/dL — ABNORMAL HIGH (ref 70–99)
Glucose-Capillary: 147 mg/dL — ABNORMAL HIGH (ref 70–99)
Glucose-Capillary: 77 mg/dL (ref 70–99)

## 2022-07-16 LAB — APTT: aPTT: 20 seconds — ABNORMAL LOW (ref 24–36)

## 2022-07-16 LAB — PROTIME-INR
INR: 1 (ref 0.8–1.2)
Prothrombin Time: 12.9 seconds (ref 11.4–15.2)

## 2022-07-16 LAB — LACTIC ACID, PLASMA: Lactic Acid, Venous: 1.4 mmol/L (ref 0.5–1.9)

## 2022-07-16 LAB — ETHANOL: Alcohol, Ethyl (B): <10 mg/dL (ref <10)

## 2022-07-16 LAB — SARS CORONAVIRUS 2 BY RT PCR: SARS Coronavirus 2 by RT PCR: NEGATIVE

## 2022-07-16 MED ORDER — INSULIN GLARGINE-YFGN 100 UNIT/ML ~~LOC~~ SOLN
10.0000 [IU] | Freq: Every day | SUBCUTANEOUS | Status: AC
  Administered 2022-07-16: 10 [IU] via SUBCUTANEOUS
  Filled 2022-07-16: qty 0.1

## 2022-07-16 MED ORDER — IOHEXOL 350 MG/ML SOLN
75.0000 mL | Freq: Once | INTRAVENOUS | Status: AC | PRN
  Administered 2022-07-16: 75 mL via INTRAVENOUS

## 2022-07-16 MED ORDER — DEXTROSE 50 % IV SOLN: 0.0000 mL | INTRAVENOUS | Status: DC | PRN

## 2022-07-16 MED ORDER — INSULIN ASPART 100 UNIT/ML IJ SOLN
0.0000 [IU] | Freq: Three times a day (TID) | INTRAMUSCULAR | Status: DC
  Administered 2022-07-17: 15 [IU] via SUBCUTANEOUS
  Administered 2022-07-17 – 2022-07-18 (×2): 2 [IU] via SUBCUTANEOUS
  Administered 2022-07-18: 3 [IU] via SUBCUTANEOUS
  Administered 2022-07-18 – 2022-07-19 (×3): 5 [IU] via SUBCUTANEOUS
  Administered 2022-07-19: 2 [IU] via SUBCUTANEOUS
  Filled 2022-07-16: qty 0.15

## 2022-07-16 MED ORDER — SODIUM CHLORIDE (PF) 0.9 % IJ SOLN
INTRAMUSCULAR | Status: AC
  Filled 2022-07-16: qty 50

## 2022-07-16 MED ORDER — INSULIN ASPART 100 UNIT/ML IJ SOLN
10.0000 [IU] | Freq: Once | INTRAMUSCULAR | Status: AC
  Administered 2022-07-16: 10 [IU] via SUBCUTANEOUS
  Filled 2022-07-16: qty 0.1

## 2022-07-16 MED ORDER — LORAZEPAM 1 MG PO TABS
1.0000 mg | ORAL_TABLET | Freq: Once | ORAL | Status: AC | PRN
  Administered 2022-07-16: 1 mg via ORAL
  Filled 2022-07-16: qty 1

## 2022-07-16 MED ORDER — LACTATED RINGERS IV SOLN: INTRAVENOUS | Status: DC

## 2022-07-16 MED ORDER — DEXTROSE IN LACTATED RINGERS 5 % IV SOLN: INTRAVENOUS | Status: DC

## 2022-07-16 MED ORDER — HYDROMORPHONE HCL 1 MG/ML IJ SOLN
1.0000 mg | Freq: Once | INTRAMUSCULAR | Status: AC
  Administered 2022-07-16: 1 mg via INTRAVENOUS
  Filled 2022-07-16: qty 1

## 2022-07-16 MED ORDER — SODIUM CHLORIDE 0.9 % IV SOLN: INTRAVENOUS | Status: DC

## 2022-07-16 MED ORDER — LACTATED RINGERS IV BOLUS
1000.0000 mL | Freq: Once | INTRAVENOUS | Status: AC
  Administered 2022-07-16: 1000 mL via INTRAVENOUS

## 2022-07-16 MED ORDER — HYDRALAZINE HCL 20 MG/ML IJ SOLN: 10.0000 mg | Freq: Four times a day (QID) | INTRAMUSCULAR | Status: DC | PRN

## 2022-07-16 MED ORDER — INSULIN ASPART PROT & ASPART (70-30 MIX) 100 UNIT/ML ~~LOC~~ SUSP: 10.0000 [IU] | Freq: Once | SUBCUTANEOUS | Status: DC

## 2022-07-16 MED ORDER — POTASSIUM CHLORIDE 10 MEQ/100ML IV SOLN
10.0000 meq | INTRAVENOUS | Status: AC
  Administered 2022-07-16 (×2): 10 meq via INTRAVENOUS
  Filled 2022-07-16 (×2): qty 100

## 2022-07-16 MED ORDER — INSULIN REGULAR(HUMAN) IN NACL 100-0.9 UT/100ML-% IV SOLN
INTRAVENOUS | Status: DC
  Administered 2022-07-16: 10 [IU]/h via INTRAVENOUS
  Filled 2022-07-16: qty 100

## 2022-07-16 NOTE — Consult Note (Signed)
[facility]  Patient name: William Acevedo MRN: [id number] DOB: 06/24/1955 Sex: male   REQUESTING PROVIDER:   ER   REASON FOR CONSULT:    Right renal infarct  HISTORY OF PRESENT ILLNESS:   William Acevedo is a 67 y.o. male, who presented to the emergency department with a 1 day history of nausea and vomiting as well as concern over elevated blood sugar.  He also had issues with chest pain shortness of breath and headaches.  He underwent a CT angiogram that showed a right renal infarct for which I was consulted.  The patient has a history of polysubstance abuse, diabetes, hypertension and stroke.  PAST MEDICAL HISTORY    Past Medical History:  Diagnosis Date   Carpal tunnel syndrome    Cocaine use    Depression    Diabetes mellitus without complication (HCC)    Endocarditis    ETOH abuse    Hypertensive urgency 04/14/2020   Neuropathy    PTSD (post-traumatic stress disorder)    Stroke (cerebrum) (HCC)    multiple   Zoster      FAMILY HISTORY   History reviewed. No pertinent family history.  SOCIAL HISTORY:   Social History   Socioeconomic History   Marital status: Married    Spouse name: Not on file   Number of children: Not on file   Years of education: Not on file   Highest education level: Not on file  Occupational History   Not on file  Tobacco Use   Smoking status: Every Day    Packs/day: .5    Types: Cigarettes   Smokeless tobacco: Never  Vaping Use   Vaping Use: Never used  Substance and Sexual Activity   Alcohol use: Yes    Alcohol/week: 7.0 standard drinks of alcohol    Types: 7 Glasses of wine per week   Drug use: Not Currently    Types: Cocaine   Sexual activity: Not on file  Other Topics Concern   Not on file  Social History Narrative   Not on file   Social Determinants of Health   Financial Resource Strain: Not on file  Food Insecurity: Not on file   Transportation Needs: Not on file  Physical Activity: Not on file  Stress: Not on file  Social Connections: Not on file  Intimate Partner Violence: Not on file    ALLERGIES:    Allergies  Allergen Reactions   Lisinopril Swelling    Angioedema Pt reports not allergic 06/07/20   Omeprazole     Other reaction(s): ANGIOEDEMA OF LIPS, Angioedema of tongue. Pt reports not allergic 06/07/20    CURRENT MEDICATIONS:    Current Facility-Administered Medications  Medication Dose Route Frequency Provider Last Rate Last Admin   0.9 %  sodium chloride infusion   Intravenous Continuous Albertine Grates, MD 125 mL/hr at 07/16/22 2110 New Bag at 07/16/22 2110   [START ON 07/17/2022] insulin aspart (novoLOG) injection 0-15 Units  0-15 Units Subcutaneous TID WC Albertine Grates, MD       Current Outpatient Medications  Medication Sig Dispense Refill   ACCU-CHEK GUIDE test strip      acetaminophen (TYLENOL) 325 MG tablet Take 1-2 tablets (325-650 mg total) by mouth every 4 (four) hours as needed for mild pain.     aspirin EC 81 MG tablet Take 1 tablet (81 mg total) by mouth daily. Swallow whole. 30 tablet 0   atorvastatin (LIPITOR) 80 MG tablet Take 1  tablet (80 mg total) by mouth daily. 30 tablet 0   blood glucose meter kit and supplies KIT Dispense based on patient and insurance preference. Use up to four times daily as directed. (FOR ICD-9 250.00, 250.01). 1 each 0   clopidogrel (PLAVIX) 75 MG tablet Take 1 tablet (75 mg total) by mouth daily. 30 tablet 0   DULoxetine (CYMBALTA) 60 MG capsule Take 1 capsule (60 mg total) by mouth daily. For chronic pain and mood 30 capsule 0   famotidine (PEPCID) 20 MG tablet Take 1 tablet (20 mg total) by mouth daily before breakfast. 30 tablet 0   FARXIGA 10 MG TABS tablet Take 1 tablet (10 mg total) by mouth daily. 30 tablet 0   gabapentin (NEURONTIN) 300 MG capsule Take 1 capsule (300 mg total) by mouth 2 (two) times daily. 60 capsule 0   insulin glargine (LANTUS) 100  UNIT/ML Solostar Pen Inject 5 Units into the skin daily. 15 mL 0   Insulin Pen Needle (PEN NEEDLES) 31G X 5 MM MISC 1 Application by Does not apply route daily at 6 (six) AM. 100 each 0   l-methylfolate-B6-B12 (METANX) 3-35-2 MG TABS tablet Take 1 tablet by mouth daily. 60 tablet 0   magnesium gluconate (MAGONATE) 500 MG tablet Take 1 tablet (500 mg total) by mouth at bedtime. 30 tablet 0   melatonin 3 MG TABS tablet Take 2 tablets (6 mg total) by mouth at bedtime as needed (for sleep). 30 tablet 0   metFORMIN (GLUCOPHAGE) 1000 MG tablet Take 1 tablet (1,000 mg total) by mouth 2 (two) times daily with a meal. 60 tablet 0   Multiple Vitamin (MULTIVITAMIN WITH MINERALS) TABS tablet Take 1 tablet by mouth daily. 100 tablet 0   OLANZapine (ZYPREXA) 5 MG tablet Take 1 tablet (5 mg total) by mouth at bedtime. 30 tablet 0   senna-docusate (SENOKOT-S) 8.6-50 MG tablet Take 1 tablet by mouth at bedtime as needed for mild constipation. 30 tablet 0   traZODone (DESYREL) 100 MG tablet Take 1 tablet (100 mg total) by mouth at bedtime. 30 tablet 0    REVIEW OF SYSTEMS:   Unable to obtain as the patient is not interactive  PHYSICAL EXAM:   Vitals:   07/16/22 1630 07/16/22 1800 07/16/22 1830 07/16/22 1900  BP: (!) 160/83 135/83 111/65 126/77  Pulse: (!) 110 98 80 80  Resp: 15 20  18   Temp: 99.1 F (37.3 C)     TempSrc: Oral     SpO2: 98% 100% 96% 100%  Weight:      Height:        GENERAL: The patient is a well-nourished male, in no acute distress. The vital signs are documented above. CARDIAC: There is a regular rate and rhythm.  PULMONARY: Nonlabored respirations ABDOMEN: Soft and non-tender  MUSCULOSKELETAL: There are no major deformities or cyanosis. NEUROLOGIC: Patient is not interactive.  He does open his eyes when I talk to him but does not answer questions SKIN: There are no ulcers or rashes noted.   STUDIES:   I have reviewed his CT scan with the following findings: VASCULAR:    1. No evidence of acute aortic syndrome as queried. Severe calcific atherosclerosis of the thoracic and abdominal aorta without aneurysm, dissection or severe stenosis. 2. Diminutive caliber of an accessory right renal artery supplying the lower third of the right kidney, with resulting acute renal infarct of the inferior pole of the right kidney. 3. Severe short segment luminal narrowing (approximately  75%) at the ostium of the left renal artery secondary to calcific atherosclerosis. 4. Moderate short segment (approximately 50%) luminal narrowing of the proximal bilateral common iliac arteries. 5. Moderate (approximately 50%) luminal narrowing of the proximal left subclavian artery secondary to hypodense plaque.   NONVASCULAR:   1. Several subpleural solid pulmonary nodules are noted in right middle lobe, the largest measuring up to 0.6 cm, favored to be inflammatory in etiology given the consolidation previously noted in this area on prior exams. Non-contrast chest CT at 3-6 months is recommended. If the nodules are stable at time of repeat CT, then future CT at 18-24 months (from today's scan) is considered optional for low-risk patients, but is recommended for high-risk patients. This recommendation follows the consensus statement: Guidelines for Management of Incidental Pulmonary Nodules Detected on CT Images: From the Fleischner Society 2017; Radiology 2017; 284:228-243. 2. Hypoenhancement of the lower third of the right kidney, as noted above, consistent with acute renal infarct. 3. Urinary bladder is markedly distended, nearly to the level of the umbilicus but otherwise unremarkable. Correlate for urinary retention. 4. Prostatomegaly. 5. Diverticulosis of the descending and sigmoid colon without findings of diverticulitis. 6. Nonobstructive right nephrolithiasis. 7. Emphysema.   Aortic Atherosclerosis (ICD10-I70.0) and Emphysema (ICD10-J43.9).  ASSESSMENT and PLAN    Right renal infarct: This appears to be the result of progressive atherosclerotic vascular disease causing occlusion of an accessory renal to the right kidney supplying the lower third, which is the area of infarct.  This does not appear to be embolic however cardiac evaluation with echocardiogram should be obtained as well as evaluation for arrhythmia.  As this appears to be progressive atherosclerotic vascular disease, I would think that aspirin and Plavix should be sufficient.  There is no indication for surgical intervention.  His pain should resolve with time.  He should be hydrated with monitoring of his renal function.  Please contact me with any additional questions or concerns.   Charlena Cross, MD, FACS [facility] [phone] Pager [phone]

## 2022-07-16 NOTE — ED Notes (Signed)
Pt in CT at this time.

## 2022-07-16 NOTE — ED Notes (Signed)
ED TO INPATIENT HANDOFF REPORT  ED Nurse Name and Phone #: Thurmon Fair. Theora Gianotti Name/Age/Gender William Acevedo 67 y.o. male Room/Bed: WA25/WA25  Code Status   Code Status: Full Code  Home/SNF/Other Home Patient oriented to: self Is this baseline? Yes   Triage Complete: Triage complete  Chief Complaint Renal infarction [facility]) [N28.0]  Triage Note Pt arrived via EMS, from home. C/o continuous n/v since last night and hyperglycemia. Family states they checked CBG before EMS arrival, was in the 500s. Recently seen for same 06/26/22.    CBG 453 on EMS arrival.   VS PTA 142/88 130s HR 22 RR 96-98%   Allergies Allergies  Allergen Reactions   Lisinopril Swelling    Angioedema Pt reports not allergic 06/07/20   Omeprazole     Other reaction(s): ANGIOEDEMA OF LIPS, Angioedema of tongue. Pt reports not allergic 06/07/20    Level of Care/Admitting Diagnosis ED Disposition     ED Disposition  Admit   Condition  --   Comment  Hospital Area: [facility] [[id number]]  Level of Care: Telemetry [5]  Admit to tele based on following criteria: Other see comments  Comments: Rule out arrhythmia  May place patient in observation at [facility] or [facility] if equivalent level of care is available:: No  Covid Evaluation: Asymptomatic - no recent exposure (last 10 days) testing not required  Diagnosis: Renal infarction [facility]) [[id number]]  Admitting Physician: Albertine Grates [[id number]]  Attending Physician: Albertine Grates [[id number]]          B Medical/Surgery History Past Medical History:  Diagnosis Date   Carpal tunnel syndrome    Cocaine use    Depression    Diabetes mellitus without complication (HCC)    Endocarditis    ETOH abuse    Hypertensive urgency 04/14/2020   Neuropathy    PTSD (post-traumatic stress disorder)    Stroke (cerebrum) (HCC)    multiple   Zoster    Past Surgical History:  Procedure Laterality Date   BUBBLE STUDY  03/30/2020   Procedure:  BUBBLE STUDY;  Surgeon: Jake Bathe, MD;  Location: [facility] ENDOSCOPY;  Service: Cardiovascular;;   TEE WITHOUT CARDIOVERSION N/A 03/30/2020   Procedure: TRANSESOPHAGEAL ECHOCARDIOGRAM (TEE);  Surgeon: Jake Bathe, MD;  Location: [facility] ENDOSCOPY;  Service: Cardiovascular;  Laterality: N/A;   TESTICLE TORSION REDUCTION       A IV Location/Drains/Wounds Patient Lines/Drains/Airways Status     Active Line/Drains/Airways     Name Placement date Placement time Site Days   Peripheral IV 07/16/22 22 G 1" Left Antecubital 07/16/22  1222  Antecubital  less than 1   Peripheral IV 07/16/22 20 G 1" Right Antecubital 07/16/22  1403  Antecubital  less than 1   Urethral Catheter Double-lumen;Latex 16 Fr. 07/16/22  1929  Double-lumen;Latex  less than 1   Wound / Incision (Open or Dehisced) 02/24/22 Incision - Open;(MARSI) Medical Adhesive-Related Skin Injury Abdomen Left;Lower Pt reports having a JTube removed recently. Area is 95% pink in color with dermis removed. 5% or less slough white at site where  02/24/22  2140  Abdomen  142   Wound / Incision (Open or Dehisced) 02/24/22 Diabetic ulcer Finger (Comment which one) Anterior;Right Eschar present. 100%. Ulcer and callous at the tip of the finger. Blackend in color. 02/24/22  2140  Finger (Comment which one)  142            Intake/Output Last 24 hours  Intake/Output Summary (Last 24 hours)  at 07/16/2022 2204 Last data filed at 07/16/2022 2111 Gross per 24 hour  Intake 2832.09 ml  Output 900 ml  Net 1932.09 ml    Labs/Imaging Results for orders placed or performed during the hospital encounter of 07/16/22 (from the past 48 hour(s))  Basic metabolic panel     Status: Abnormal   Collection Time: 07/16/22 12:11 PM  Result Value Ref Range   Sodium 136 135 - 145 mmol/L   Potassium 4.3 3.5 - 5.1 mmol/L   Chloride 93 (L) 98 - 111 mmol/L   CO2 25 22 - 32 mmol/L   Glucose, Bld 414 (H) 70 - 99 mg/dL    Comment: Glucose reference range applies only to  samples taken after fasting for at least 8 hours.   BUN 31 (H) 8 - 23 mg/dL   Creatinine, Ser 1.61 0.61 - 1.24 mg/dL   Calcium 09.6 8.9 - 04.5 mg/dL   GFR, Estimated >40 >98 mL/min    Comment: (NOTE) Calculated using the CKD-EPI Creatinine Equation (2021)    Anion gap 18 (H) 5 - 15    Comment: Performed at [facility], [address], [place], [place] [address]  CBC     Status: None   Collection Time: 07/16/22 12:11 PM  Result Value Ref Range   WBC 7.8 4.0 - 10.5 K/uL   RBC 5.02 4.22 - 5.81 MIL/uL   Hemoglobin 14.0 13.0 - 17.0 g/dL   HCT 78.2 95.6 - 21.3 %   MCV 86.5 80.0 - 100.0 fL   MCH 27.9 26.0 - 34.0 pg   MCHC 32.3 30.0 - 36.0 g/dL   RDW 08.6 57.8 - 46.9 %   Platelets 264 150 - 400 K/uL   nRBC 0.0 0.0 - 0.2 %    Comment: Performed at [facility], [address] [place], [place] [address]  Differential     Status: None   Collection Time: 07/16/22 12:11 PM  Result Value Ref Range   Neutrophils Relative % 84 %   Neutro Abs 6.7 1.7 - 7.7 K/uL   Lymphocytes Relative 11 %   Lymphs Abs 0.9 0.7 - 4.0 K/uL   Monocytes Relative 5 %   Monocytes Absolute 0.4 0.1 - 1.0 K/uL   Eosinophils Relative 0 %   Eosinophils Absolute 0.0 0.0 - 0.5 K/uL   Basophils Relative 0 %   Basophils Absolute 0.0 0.0 - 0.1 K/uL   Immature Granulocytes 0 %   Abs Immature Granulocytes 0.03 0.00 - 0.07 K/uL    Comment: Performed at [facility], [address], [place], [place] [address]  Ethanol     Status: None   Collection Time: 07/16/22 12:12 PM  Result Value Ref Range   Alcohol, Ethyl (B) <10 <10 mg/dL    Comment: (NOTE) Lowest detectable limit for serum alcohol is 10 mg/dL.  For medical purposes only. Performed at [facility], [address], [place], [place] [address]   Protime-INR     Status: None   Collection Time: 07/16/22 12:12 PM  Result Value Ref Range   Prothrombin Time 12.9 11.4 - 15.2 seconds   INR  1.0 0.8 - 1.2    Comment: (NOTE) INR goal varies based on device and disease states. Performed at [facility], [address] [place], [place] [address]   APTT     Status: Abnormal   Collection Time: 07/16/22 12:12 PM  Result Value Ref Range   aPTT 20 (L)  24 - 36 seconds    Comment: Performed at [facility], [address], [place], [place] [address]  CBG monitoring, ED     Status: Abnormal   Collection Time: 07/16/22 12:13 PM  Result Value Ref Range   Glucose-Capillary 391 (H) 70 - 99 mg/dL    Comment: Glucose reference range applies only to samples taken after fasting for at least 8 hours.   Comment 1 Notify RN   Blood gas, venous (at [facility] and [facility])     Status: Abnormal   Collection Time: 07/16/22  4:00 PM  Result Value Ref Range   pH, Ven 7.37 7.25 - 7.43   pCO2, Ven 50 44 - 60 mmHg   pO2, Ven <31 (LL) 32 - 45 mmHg    Comment: CRITICAL RESULT CALLED TO, READ BACK BY AND VERIFIED WITH: BREANNA S. RN AT 1627 ON 07/16/2022 BY MECIAL J.    Bicarbonate 28.9 (H) 20.0 - 28.0 mmol/L   Acid-Base Excess 2.7 (H) 0.0 - 2.0 mmol/L   O2 Saturation 32.4 %   Patient temperature 37.0     Comment: Performed at [facility], [address], [place], [place] [address]  Lactic acid, plasma     Status: None   Collection Time: 07/16/22  4:20 PM  Result Value Ref Range   Lactic Acid, Venous 1.4 0.5 - 1.9 mmol/L    Comment: Performed at [facility], [address], [place], [place] [address]  CBG monitoring, ED     Status: Abnormal   Collection Time: 07/16/22  4:26 PM  Result Value Ref Range   Glucose-Capillary 306 (H) 70 - 99 mg/dL    Comment: Glucose reference range applies only to samples taken after fasting for at least 8 hours.  Urinalysis, Routine w reflex microscopic -Urine, Clean Catch     Status: Abnormal   Collection Time: 07/16/22  4:45 PM  Result Value Ref Range   Color, Urine YELLOW YELLOW   APPearance  CLEAR CLEAR   Specific Gravity, Urine 1.030 1.005 - 1.030   pH 5.0 5.0 - 8.0   Glucose, UA >=500 (A) NEGATIVE mg/dL   Hgb urine dipstick SMALL (A) NEGATIVE   Bilirubin Urine NEGATIVE NEGATIVE   Ketones, ur 20 (A) NEGATIVE mg/dL   Protein, ur >=914 (A) NEGATIVE mg/dL   Nitrite NEGATIVE NEGATIVE   Leukocytes,Ua NEGATIVE NEGATIVE   RBC / HPF 0-5 0 - 5 RBC/hpf   WBC, UA 11-20 0 - 5 WBC/hpf   Bacteria, UA RARE (A) NONE SEEN   Squamous Epithelial / HPF 0-5 0 - 5 /HPF    Comment: Performed at [facility], [address], [place], [place] [address]  Urine rapid drug screen (hosp performed)     Status: None   Collection Time: 07/16/22  4:45 PM  Result Value Ref Range   Opiates NONE DETECTED NONE DETECTED   Cocaine NONE DETECTED NONE DETECTED   Benzodiazepines NONE DETECTED NONE DETECTED   Amphetamines NONE DETECTED NONE DETECTED   Tetrahydrocannabinol NONE DETECTED NONE DETECTED   Barbiturates NONE DETECTED NONE DETECTED    Comment: (NOTE) DRUG SCREEN FOR MEDICAL PURPOSES ONLY.  IF CONFIRMATION IS NEEDED FOR ANY PURPOSE, NOTIFY LAB WITHIN 5 DAYS.  LOWEST DETECTABLE LIMITS FOR URINE DRUG SCREEN Drug Class                     Cutoff (ng/mL) Amphetamine and metabolites    1000 Barbiturate and metabolites  200 Benzodiazepine                 200 Opiates and metabolites        300 Cocaine and metabolites        300 THC                            50 Performed at [facility], [address], [place], [place] [address]   Basic metabolic panel     Status: Abnormal   Collection Time: 07/16/22  5:30 PM  Result Value Ref Range   Sodium 141 135 - 145 mmol/L   Potassium 3.6 3.5 - 5.1 mmol/L   Chloride 109 98 - 111 mmol/L   CO2 22 22 - 32 mmol/L   Glucose, Bld 237 (H) 70 - 99 mg/dL    Comment: Glucose reference range applies only to samples taken after fasting for at least 8 hours.   BUN 23 8 - 23 mg/dL   Creatinine, Ser 0.98 0.61 - 1.24 mg/dL    Calcium 7.2 (L) 8.9 - 10.3 mg/dL    Comment: DELTA CHECK NOTED   GFR, Estimated >60 >60 mL/min    Comment: (NOTE) Calculated using the CKD-EPI Creatinine Equation (2021)    Anion gap 10 5 - 15    Comment: Performed at [facility], [address], [place], [place] [address]  Beta-hydroxybutyric acid     Status: Abnormal   Collection Time: 07/16/22  5:30 PM  Result Value Ref Range   Beta-Hydroxybutyric Acid 2.11 (H) 0.05 - 0.27 mmol/L    Comment: Performed at [facility], [address] [place], [place] [address]  CBG monitoring, ED     Status: Abnormal   Collection Time: 07/16/22  7:19 PM  Result Value Ref Range   Glucose-Capillary 147 (H) 70 - 99 mg/dL    Comment: Glucose reference range applies only to samples taken after fasting for at least 8 hours.  CBG monitoring, ED     Status: None   Collection Time: 07/16/22  9:54 PM  Result Value Ref Range   Glucose-Capillary 77 70 - 99 mg/dL    Comment: Glucose reference range applies only to samples taken after fasting for at least 8 hours.   MR BRAIN WO CONTRAST  Result Date: 07/16/2022 CLINICAL DATA:  Neuro deficit, acute, stroke suspected. Hyperglycemia, nausea, and vomiting. EXAM: MRI HEAD WITHOUT CONTRAST TECHNIQUE: Multiplanar, multiecho pulse sequences of the brain and surrounding structures were obtained without intravenous contrast. COMPARISON:  Head CT 07/16/2022 and MRI 02/17/2022 FINDINGS: Some sequences are moderately motion degraded. Brain: There is no evidence of an acute infarct, mass, midline shift, or extra-axial fluid collection. A chronic ACA territory infarct in the left frontal lobe was acute on the prior MRI. Chronic infarcts are again noted in the right parietal and bilateral occipital lobes as well as both cerebellar hemispheres. There is a background of moderate chronic small vessel ischemia in the cerebral white matter and pons. Wallerian degeneration is noted in the left  cerebral peduncle. Scattered chronic cerebral microhemorrhages are again seen bilaterally. There is mild cerebral atrophy. Vascular: Major intracranial vascular flow voids are preserved. Skull and upper cervical spine: Unremarkable bone marrow signal. Sinuses/Orbits: Unremarkable orbits. Mucosal thickening and/or fluid in the left sphenoid sinus and a posterior left ethmoid air cell. Clear mastoid air cells. Other: None. IMPRESSION: 1. No acute intracranial abnormality. 2. Moderate chronic small vessel ischemia  with multiple chronic infarcts as above. Electronically Signed   By: Sebastian Ache M.D.   On: 07/16/2022 18:33   CT Angio Chest/Abd/Pel for Dissection W and/or Wo Contrast  Result Date: 07/16/2022 CLINICAL DATA:  Acute aortic syndrome suspected. Continuous nausea and vomiting. History of diabetes, alcohol and drug abuse, endocarditis, and strokes. EXAM: CT ANGIOGRAPHY CHEST, ABDOMEN AND PELVIS TECHNIQUE: Non-contrast CT of the chest was initially obtained. Multidetector CT imaging through the chest, abdomen and pelvis was performed using the standard protocol during bolus administration of intravenous contrast. Multiplanar reconstructed images and MIPs were obtained and reviewed to evaluate the vascular anatomy. RADIATION DOSE REDUCTION: This exam was performed according to the departmental dose-optimization program which includes automated exposure control, adjustment of the mA and/or kV according to patient size and/or use of iterative reconstruction technique. CONTRAST:  75mL OMNIPAQUE IOHEXOL 350 MG/ML SOLN COMPARISON:  CT abdomen pelvis 02/17/2022, CTA abdomen pelvis 04/04/2021 FINDINGS: CTA CHEST FINDINGS Cardiovascular: Preferential opacification of the thoracic aorta. No evidence of thoracic aortic aneurysm or dissection. Extensive calcific atherosclerosis throughout the thoracic aorta. Multivessel coronary artery calcification. Moderate (approximately 50%) luminal narrowing of the proximal left  subclavian artery secondary to hypodense plaque (series 10, image 19). Normal heart size. No pericardial effusion. Mediastinum/Nodes: No enlarged mediastinal, hilar, or axillary lymph nodes. Thyroid gland and trachea demonstrate no significant findings. Circumferential wall thickening of the distal esophagus. Lungs/Pleura: Several subpleural solid pulmonary nodules are noted in the lateral aspect of the right middle lobe, the largest measuring 0.6 x 0.5 cm (series 6, image 85). These are new compared to prior CT chest on 03/25/2020; of note, this area was not included on the field of view on the multiple interval CT abdomen pelvis exams. Inferior to the nodules is persistent subsegmental atelectasis versus scarring, similar to CT abdomen pelvis 01/23/2022 and improved compared to 04/04/2021. An additional 0.4 x 0.3 cm subpleural solid pulmonary nodule is noted in the right lower lobe (series 6, image 76). Similarly, this is also new compared to prior CT chest on 03/25/2020 but not included in the field of view on the multiple interval abdomen/pelvis exams. Mild upper lobe predominant paraseptal emphysema. Musculoskeletal: No chest wall abnormality. No acute or significant osseous findings. Review of the MIP images confirms the above findings. CTA ABDOMEN AND PELVIS FINDINGS VASCULAR Aorta: Extensive calcific atherosclerosis with mild luminal narrowing immediately superior to the iliac bifurcation. No aneurysm, dissection, vasculitis or significant stenosis. Celiac: Patent without evidence of aneurysm, dissection, or vasculitis. Moderate short-segment luminal narrowing (approximately 50%) at the ostium secondary to calcific atherosclerosis. SMA: Patent without evidence of aneurysm, dissection, vasculitis or significant stenosis. Renals: Right renal artery is patent without evidence of severe stenosis. An inferior accessory right renal artery which supplies the inferior aspect of the right kidney is patent but  diminutive in caliber along the proximal half of the vessel (series 10, image 157-159). Proximally severe short segment luminal narrowing (approximately greater than 75%) at the ostium of the left renal artery secondary to atherosclerosis (series 10, image 161. An accessory left renal artery is noted. IMA: Patent without evidence of aneurysm, dissection, or vasculitis. The ostium of the IMA is not well visualized secondary to calcific atherosclerosis. Inflow: Patent without evidence of aneurysm, dissection, or vasculitis. Short segment moderate (approximately 50%) luminal narrowing at the origins of the bilateral common iliac arteries (series 10, image 198). The external iliac and internal iliac arteries are normal caliber. Veins: No obvious venous abnormality within the limitations of this arterial phase  study. Review of the MIP images confirms the above findings. NON-VASCULAR Hepatobiliary: No focal liver abnormality is seen. No gallstones, gallbladder wall thickening, or biliary dilatation. Pancreas: Unremarkable. No pancreatic ductal dilatation or surrounding inflammatory changes. Spleen: Normal in size without focal abnormality. Adrenals/Urinary Tract: Adrenal glands are unremarkable. Two punctate nonobstructive calyceal stones in the upper and middle thirds of the right kidney. Multifocal cortical scarring bilaterally. No hydronephrosis. No suspicious renal lesion. Hypoenhancement of the lower third of the right kidney (series 10, image 166; series 14, image 50). Urinary bladder is markedly distended, nearly to the level of the umbilicus but otherwise unremarkable. Stomach/Bowel: Stomach is within normal limits. Appendix appears normal. Diverticulosis of the descending and sigmoid colon. No evidence of bowel wall thickening, distention, or inflammatory changes. Lymphatic: No enlarged abdominopelvic lymph nodes. Reproductive: Prostatomegaly. Other: No abdominal wall hernia or abnormality. No abdominopelvic  ascites. Musculoskeletal: No acute or suspicious osseous findings. Review of the MIP images confirms the above findings. IMPRESSION: VASCULAR: 1. No evidence of acute aortic syndrome as queried. Severe calcific atherosclerosis of the thoracic and abdominal aorta without aneurysm, dissection or severe stenosis. 2. Diminutive caliber of an accessory right renal artery supplying the lower third of the right kidney, with resulting acute renal infarct of the inferior pole of the right kidney. 3. Severe short segment luminal narrowing (approximately 75%) at the ostium of the left renal artery secondary to calcific atherosclerosis. 4. Moderate short segment (approximately 50%) luminal narrowing of the proximal bilateral common iliac arteries. 5. Moderate (approximately 50%) luminal narrowing of the proximal left subclavian artery secondary to hypodense plaque. NONVASCULAR: 1. Several subpleural solid pulmonary nodules are noted in right middle lobe, the largest measuring up to 0.6 cm, favored to be inflammatory in etiology given the consolidation previously noted in this area on prior exams. Non-contrast chest CT at 3-6 months is recommended. If the nodules are stable at time of repeat CT, then future CT at 18-24 months (from today's scan) is considered optional for low-risk patients, but is recommended for high-risk patients. This recommendation follows the consensus statement: Guidelines for Management of Incidental Pulmonary Nodules Detected on CT Images: From the Fleischner Society 2017; Radiology 2017; 284:228-243. 2. Hypoenhancement of the lower third of the right kidney, as noted above, consistent with acute renal infarct. 3. Urinary bladder is markedly distended, nearly to the level of the umbilicus but otherwise unremarkable. Correlate for urinary retention. 4. Prostatomegaly. 5. Diverticulosis of the descending and sigmoid colon without findings of diverticulitis. 6. Nonobstructive right nephrolithiasis. 7.  Emphysema. Aortic Atherosclerosis (ICD10-I70.0) and Emphysema (ICD10-J43.9). Electronically Signed   By: Sherron Ales M.D.   On: 07/16/2022 17:04   CT HEAD WO CONTRAST  Result Date: 07/16/2022 CLINICAL DATA:  Continuous nausea and vomiting with elevated blood sugar. History of diabetes. ETOH and drug abuse. Multiple strokes. EXAM: CT HEAD WITHOUT CONTRAST TECHNIQUE: Contiguous axial images were obtained from the base of the skull through the vertex without intravenous contrast. RADIATION DOSE REDUCTION: This exam was performed according to the departmental dose-optimization program which includes automated exposure control, adjustment of the mA and/or kV according to patient size and/or use of iterative reconstruction technique. COMPARISON:  02/17/2022 FINDINGS: Brain: Ventricles, cisterns and other CSF spaces are within normal. Evidence of old bilateral infarcts unchanged. Mild chronic ischemic microvascular disease. No mass, mass effect or shift of midline structures. No evidence of acute hemorrhage. Vascular: No hyperdense vessel or unexpected calcification. Skull: Normal. Negative for fracture or focal lesion. Sinuses/Orbits: Orbits are normal. Minimal  opacification over the left ethmoid air cells. Other: None. IMPRESSION: 1. No acute findings. 2. Old bilateral infarcts unchanged. 3. Mild chronic ischemic microvascular disease. Electronically Signed   By: Elberta Fortis M.D.   On: 07/16/2022 16:18    Pending Labs Unresulted Labs (From admission, onward)     Start     Ordered   07/17/22 0500  Lipid panel  Tomorrow morning,   R        07/16/22 2159   07/16/22 1735  SARS Coronavirus 2 by RT PCR (hospital order, performed in [facility] hospital lab) *cepheid single result test* Anterior Nasal Swab  (Tier 2 - SARS Coronavirus 2 by RT PCR (hospital order, performed in [facility] hospital lab) *cepheid single result test*)  Once,   URGENT        07/16/22 1734   07/16/22 1700  Basic metabolic panel   (Diabetes Ketoacidosis (DKA))  STAT Now then every 4 hours ,   STAT      07/16/22 1450   Signed and Held  CBC  (enoxaparin (LOVENOX)    CrCl >/= 30 ml/min)  Once,   R       Comments: Baseline for enoxaparin therapy IF NOT ALREADY DRAWN.  Notify MD if PLT < 100 K.    Signed and Held   Signed and Held  Creatinine, serum  (enoxaparin (LOVENOX)    CrCl >/= 30 ml/min)  Once,   R       Comments: Baseline for enoxaparin therapy IF NOT ALREADY DRAWN.    Signed and Held   Signed and Held  Creatinine, serum  (enoxaparin (LOVENOX)    CrCl >/= 30 ml/min)  Weekly,   R     Comments: while on enoxaparin therapy    Signed and Held   Signed and Held  CBC  Tomorrow morning,   R        Signed and Held   Signed and Held  Basic metabolic panel  Tomorrow morning,   R        Signed and Held            Vitals/Pain Today's Vitals   07/16/22 1800 07/16/22 1830 07/16/22 1900 07/16/22 2200  BP: 135/83 111/65 126/77 (!) 179/99  Pulse: 98 80 80 92  Resp: 20  18 18   Temp:    97.7 F (36.5 C)  TempSrc:    Oral  SpO2: 100% 96% 100% 100%  Weight:      Height:      PainSc:        Isolation Precautions No active isolations  Medications Medications  insulin aspart (novoLOG) injection 0-15 Units (has no administration in time range)  0.9 %  sodium chloride infusion ( Intravenous New Bag/Given 07/16/22 2110)  lactated ringers bolus 1,000 mL (0 mLs Intravenous Stopped 07/16/22 1511)  potassium chloride 10 mEq in 100 mL IVPB (0 mEq Intravenous Stopped 07/16/22 1902)  iohexol (OMNIPAQUE) 350 MG/ML injection 75 mL (75 mLs Intravenous Contrast Given 07/16/22 1513)  LORazepam (ATIVAN) tablet 1 mg (1 mg Oral Given 07/16/22 1618)  lactated ringers bolus 1,000 mL (0 mLs Intravenous Stopped 07/16/22 1912)  insulin aspart (novoLOG) injection 10 Units (10 Units Subcutaneous Given 07/16/22 1640)  HYDROmorphone (DILAUDID) injection 1 mg (1 mg Intravenous Given 07/16/22 1804)  insulin glargine-yfgn (SEMGLEE) injection 10  Units (10 Units Subcutaneous Given 07/16/22 2109)    Mobility walks with device     Focused Assessments    R Recommendations: See Admitting Provider  Note  Report given to:   Additional Notes: Pt is alert only to self. F/C in place due to urinary retention.

## 2022-07-16 NOTE — ED Triage Notes (Signed)
Pt arrived via EMS, from home. C/o continuous n/v since last night and hyperglycemia. Family states they checked CBG before EMS arrival, was in the 500s. Recently seen for same 06/26/22.    CBG 453 on EMS arrival.   VS PTA 142/88 130s HR 22 RR 96-98%

## 2022-07-16 NOTE — ED Provider Notes (Signed)
Physical Exam  BP (!) 181/93   Pulse (!) 116   Temp 99.5 F (37.5 C) (Oral)   Resp 10   Ht 5\' 6"  (1.676 m)   Wt 59 kg   SpO2 96%   BMI 20.98 kg/m   Physical Exam  Procedures  Procedures  ED Course / MDM    Medical Decision Making Amount and/or Complexity of Data Reviewed Labs: ordered. Radiology: ordered.  Risk Prescription drug management. Decision regarding hospitalization.   80M, hx of polysubstance abuse, CVA, presenting with n/v abd pain and back pain, slurred speech. Dissection study. Treating developing DKA, getting fluids starting insulin gtt. MR Brain ordered.   900cc urine noted in the bladder, in and out cath performed with relief.  Laboratory evaluation revealed hyperglycemia without evidence of DKA with a VBG of 7.37, BMP with mildly elevated anion gap without acidosis with a bicarb of 25.  Repeat BMP revealed hyperglycemia with improvement to 237, bicarbonate 22, anion gap 10.  Patient was administered 2 L of IV fluids for volume resuscitation.  He initially been started on an insulin gtt. due to concern for developing DKA however his gap is closed.  He was not acidotic with a pH above 7.3.  Beta hydroxybutyrate was elevated to 2.11.  CT Head: IMPRESSION:  1. No acute findings.  2. Old bilateral infarcts unchanged.  3. Mild chronic ischemic microvascular disease.    CTA Dissection: IMPRESSION:  VASCULAR:    1. No evidence of acute aortic syndrome as queried. Severe calcific  atherosclerosis of the thoracic and abdominal aorta without  aneurysm, dissection or severe stenosis.  2. Diminutive caliber of an accessory right renal artery supplying  the lower third of the right kidney, with resulting acute renal  infarct of the inferior pole of the right kidney.  3. Severe short segment luminal narrowing (approximately 75%) at the  ostium of the left renal artery secondary to calcific  atherosclerosis.  4. Moderate short segment (approximately 50%)  luminal narrowing of  the proximal bilateral common iliac arteries.  5. Moderate (approximately 50%) luminal narrowing of the proximal  left subclavian artery secondary to hypodense plaque.    NONVASCULAR:    1. Several subpleural solid pulmonary nodules are noted in right  middle lobe, the largest measuring up to 0.6 cm, favored to be  inflammatory in etiology given the consolidation previously noted in  this area on prior exams. Non-contrast chest CT at 3-6 months is  recommended. If the nodules are stable at time of repeat CT, then  future CT at 18-24 months (from today's scan) is considered optional  for low-risk patients, but is recommended for high-risk patients.  This recommendation follows the consensus statement: Guidelines for  Management of Incidental Pulmonary Nodules Detected on CT Images:  From the Fleischner Society 2017; Radiology 2017; 284:228-243.  2. Hypoenhancement of the lower third of the right kidney, as noted  above, consistent with acute renal infarct.  3. Urinary bladder is markedly distended, nearly to the level of the  umbilicus but otherwise unremarkable. Correlate for urinary  retention.  4. Prostatomegaly.  5. Diverticulosis of the descending and sigmoid colon without  findings of diverticulitis.  6. Nonobstructive right nephrolithiasis.  7. Emphysema.    Aortic Atherosclerosis (ICD10-I70.0) and Emphysema (ICD10-J43.9).      I spoke with Dr. Myra Gianotti regarding the patient's renal infarct, he will see the patient in consultation. No immediate indication for any surgical management, recommended embolic workup.   MRI Brain: IMPRESSION:  1.  No acute intracranial abnormality.  2. Moderate chronic small vessel ischemia with multiple chronic  infarcts as above.    Hospitalist medicine consulted for admission, Dr. Roda Shutters accepting.      Ernie Avena, MD 07/16/22 [phone]

## 2022-07-16 NOTE — ED Provider Notes (Signed)
[facility]GENCY DEPARTMENT AT [facility] Provider Note   CSN: [id number] Arrival date & time: 07/16/22  1142     History  Chief Complaint  Patient presents with   Hyperglycemia    William Acevedo is a 67 y.o. male.  HPI     67 y.o. male with past medical history significant for diabetes, depression, EtOH abuse, cocaine abuse, previous endocarditis who presents with concern for elevated blood sugar, nausea, emesis since last night. According to brother patient was normal until 7 or 8 pm. He started getting sick around 10 pm. Patient has not complained of chest pain, shob, headaches. Pt complained of blurry vision and family checked his blood sugar - it was high and they called ems.  they have not seen any recent drug use.  Patient has difficulty explaining himself - per family, patient has had difficulty sometimes expressing himself after the stroke.  Home Medications Prior to Admission medications   Medication Sig Start Date End Date Taking? Authorizing Provider  ACCU-CHEK GUIDE test strip  10/29/21   [provider]  acetaminophen (TYLENOL) 325 MG tablet Take 1-2 tablets (325-650 mg total) by mouth every 4 (four) hours as needed for mild pain. 03/04/22   Love, Evlyn Kanner, PA-C  aspirin EC 81 MG tablet Take 1 tablet (81 mg total) by mouth daily. Swallow whole. 03/04/22   Love, Evlyn Kanner, PA-C  atorvastatin (LIPITOR) 80 MG tablet Take 1 tablet (80 mg total) by mouth daily. 03/04/22   Love, Evlyn Kanner, PA-C  blood glucose meter kit and supplies KIT Dispense based on patient and insurance preference. Use up to four times daily as directed. (FOR ICD-9 250.00, 250.01). 09/13/19   [name], MD  clopidogrel (PLAVIX) 75 MG tablet Take 1 tablet (75 mg total) by mouth daily. 03/04/22   Love, Evlyn Kanner, PA-C  DULoxetine (CYMBALTA) 60 MG capsule Take 1 capsule (60 mg total) by mouth daily. For chronic pain and mood 03/04/22   Love, Evlyn Kanner, PA-C  famotidine (PEPCID) 20  MG tablet Take 1 tablet (20 mg total) by mouth daily before breakfast. 03/04/22   Love, Evlyn Kanner, PA-C  FARXIGA 10 MG TABS tablet Take 1 tablet (10 mg total) by mouth daily. 03/04/22   Love, Evlyn Kanner, PA-C  gabapentin (NEURONTIN) 300 MG capsule Take 1 capsule (300 mg total) by mouth 2 (two) times daily. 03/04/22   Love, Evlyn Kanner, PA-C  insulin glargine (LANTUS) 100 UNIT/ML Solostar Pen Inject 5 Units into the skin daily. 03/04/22   Love, Evlyn Kanner, PA-C  Insulin Pen Needle (PEN NEEDLES) 31G X 5 MM MISC 1 Application by Does not apply route daily at 6 (six) AM. 03/04/22   Love, Evlyn Kanner, PA-C  l-methylfolate-B6-B12 (METANX) 3-35-2 MG TABS tablet Take 1 tablet by mouth daily. 03/04/22   Love, Evlyn Kanner, PA-C  magnesium gluconate (MAGONATE) 500 MG tablet Take 1 tablet (500 mg total) by mouth at bedtime. 03/04/22   Love, Evlyn Kanner, PA-C  melatonin 3 MG TABS tablet Take 2 tablets (6 mg total) by mouth at bedtime as needed (for sleep). 03/04/22   Love, Evlyn Kanner, PA-C  metFORMIN (GLUCOPHAGE) 1000 MG tablet Take 1 tablet (1,000 mg total) by mouth 2 (two) times daily with a meal. 03/04/22   Love, Evlyn Kanner, PA-C  Multiple Vitamin (MULTIVITAMIN WITH MINERALS) TABS tablet Take 1 tablet by mouth daily. 03/04/22   Love, Evlyn Kanner, PA-C  OLANZapine (ZYPREXA) 5 MG tablet Take 1 tablet (5 mg  total) by mouth at bedtime. 03/04/22   Love, Evlyn Kanner, PA-C  senna-docusate (SENOKOT-S) 8.6-50 MG tablet Take 1 tablet by mouth at bedtime as needed for mild constipation. 03/04/22   Love, Evlyn Kanner, PA-C  traZODone (DESYREL) 100 MG tablet Take 1 tablet (100 mg total) by mouth at bedtime. 03/04/22   Love, Evlyn Kanner, PA-C      Allergies    Lisinopril and Omeprazole    Review of Systems   Review of Systems  All other systems reviewed and are negative.   Physical Exam Updated Vital Signs BP (!) 194/91 (BP Location: Right Arm)   Pulse (!) 124   Temp 99.5 F (37.5 C) (Oral)   Resp 20   Ht 5\' 6"  (1.676 m)   Wt 59 kg   SpO2 98%   BMI  20.98 kg/m  Physical Exam Vitals and nursing note reviewed.  Constitutional:      Appearance: He is well-developed.  HENT:     Head: Atraumatic.  Eyes:     Extraocular Movements: Extraocular movements intact.     Pupils: Pupils are equal, round, and reactive to light.  Cardiovascular:     Rate and Rhythm: Tachycardia present.     Pulses: Normal pulses.  Pulmonary:     Effort: Pulmonary effort is normal.  Abdominal:     Tenderness: There is abdominal tenderness. There is guarding.     Comments: Generalized abdominal tenderness with guarding  Musculoskeletal:     Cervical back: Neck supple.  Skin:    General: Skin is warm.  Neurological:     Mental Status: He is alert and oriented to person, place, and time.     Cranial Nerves: No cranial nerve deficit.     Sensory: No sensory deficit.     Motor: No weakness.     Coordination: Coordination normal.     Comments: Slurring of the speech, some expressive aphasia     ED Results / Procedures / Treatments   Labs (all labs ordered are listed, but only abnormal results are displayed) Labs Reviewed  CBG MONITORING, ED - Abnormal; Notable for the following components:      Result Value   Glucose-Capillary 391 (*)    All other components within normal limits  CBC  BASIC METABOLIC PANEL  URINALYSIS, ROUTINE W REFLEX MICROSCOPIC    EKG EKG Interpretation Date/Time:  Saturday July 16 2022 12:00:39 EDT Ventricular Rate:  125 PR Interval:  137 QRS Duration:  82 QT Interval:  338 QTC Calculation: 488 R Axis:   -77  Text Interpretation: Sinus tachycardia LAE, consider biatrial enlargement Inferior infarct, old No acute changes No significant change since last tracing Confirmed by Derwood Kaplan [phone]) on 07/16/2022 2:43:21 PM  Radiology No results found.  Procedures .Critical Care  Performed by: Derwood Kaplan, MD Authorized by: Derwood Kaplan, MD   Critical care provider statement:    Critical care time (minutes):   48   Critical care was necessary to treat or prevent imminent or life-threatening deterioration of the following conditions:  Circulatory failure   Critical care was time spent personally by me on the following activities:  Development of treatment plan with patient or surrogate, discussions with consultants, evaluation of patient's response to treatment, examination of patient, ordering and review of laboratory studies, ordering and review of radiographic studies, ordering and performing treatments and interventions, pulse oximetry, re-evaluation of patient's condition, review of old charts and obtaining history from patient or surrogate     Medications Ordered  in ED Medications - No data to display  ED Course/ Medical Decision Making/ A&P                             Medical Decision Making Amount and/or Complexity of Data Reviewed Labs: ordered. Radiology: ordered.  Risk Prescription drug management.   67 year old patient comes in with chief complaint of nausea, vomiting, slurring of the speech.   Patient noted to be tachycardic, hypertensive at arrival.  He is having some difficulty expressing himself.  History provided by patient's brother, who called EMS.  Patient neuroexam otherwise nonfocal. He has significant abdominal tenderness.  Differential diagnosis considered for him includes aortic dissection, small bowel obstruction, DKA, gastroenteritis, colitis, acute stroke.  CT angiogram dissection ordered.  MRI of the brain ordered. DKA protocol initiated as patient has mild anion gap, hyperglycemia.  Most likely this is dehydration related starvation ketosis, but given his nausea and vomiting, will be best to put him on IV insulin along with fluid.  Patient's care will be signed out to incoming team.  Patient will need reassessment after the comprehensive workup has been completed.  If he is feeling better, then he might be able to be discharged.  I have ordered BMP for 5  PM.   Final Clinical Impression(s) / ED Diagnoses Final diagnoses:  None    Rx / DC Orders ED Discharge Orders     None         Derwood Kaplan, MD 07/16/22 1538

## 2022-07-16 NOTE — ED Notes (Signed)
Pt provided with night time snack of 4oz of juice and saltine crackers.

## 2022-07-16 NOTE — H&P (Signed)
History and Physical    William Acevedo ZOX:[id number] DOB: December 17, 1955 DOA: 07/16/2022  PCP: Hillery Aldo, NP Patient coming from: home  I have personally briefly reviewed patient's old medical records in [facility]  Chief Complaint: n/v/ab pain, elevated blood glucose  HPI: William Acevedo is a 67 y.o. male with medical history significant of schizoaffective disorder, polysubstance abuse, endocarditis, hypertension, hyperlipidemia, insulin-dependent type 2 diabetes, CVA, CAD status post non-STEMI presented to the ED due to about complaining, was seen in the ED on 6/9 for same.  ED Course:  Data reviewed: Blood pressure 126/77, pulse 80, temperature 99.1 F (37.3 C), temperature source Oral, resp. rate 18, height 5\' 6"  (1.676 m), weight 59 kg, SpO2 100 %.  CBC unremarkable Initial blood glucose 414, anion gap 18, creatinine 1.22, lactic acid 1.4   MRI brain obtained due to trouble with word finding 1. No acute intracranial abnormality. 2. Moderate chronic small vessel ischemia with multiple chronic infarcts   CTA chest abdomen pelvis showed   IMPRESSION: VASCULAR:   1. No evidence of acute aortic syndrome as queried. Severe calcific atherosclerosis of the thoracic and abdominal aorta without aneurysm, dissection or severe stenosis. 2. Diminutive caliber of an accessory right renal artery supplying the lower third of the right kidney, with resulting acute renal infarct of the inferior pole of the right kidney. 3. Severe short segment luminal narrowing (approximately 75%) at the ostium of the left renal artery secondary to calcific atherosclerosis. 4. Moderate short segment (approximately 50%) luminal narrowing of the proximal bilateral common iliac arteries. 5. Moderate (approximately 50%) luminal narrowing of the proximal left subclavian artery secondary to hypodense plaque.   NONVASCULAR:   1. Several subpleural solid pulmonary nodules are noted in  right middle lobe, the largest measuring up to 0.6 cm, favored to be inflammatory in etiology given the consolidation previously noted in this area on prior exams. Non-contrast chest CT at 3-6 months is recommended. If the nodules are stable at time of repeat CT, then future CT at 18-24 months (from today's scan) is considered optional for low-risk patients, but is recommended for high-risk patients. This recommendation follows the consensus statement: Guidelines for Management of Incidental Pulmonary Nodules Detected on CT Images: From the Fleischner Society 2017; Radiology 2017; 284:228-243. 2. Hypoenhancement of the lower third of the right kidney, as noted above, consistent with acute renal infarct. 3. Urinary bladder is markedly distended, nearly to the level of the umbilicus but otherwise unremarkable. Correlate for urinary retention. 4. Prostatomegaly. 5. Diverticulosis of the descending and sigmoid colon without findings of diverticulitis. 6. Nonobstructive right nephrolithiasis. 7. Emphysema.   Aortic Atherosclerosis (ICD10-I70.0) and Emphysema (ICD10-J43.9).  EDP discussed with vascular surgeryDr. Myra Gianotti No immediate indication for any surgical management, recommended embolic workup.      Review of Systems: As per HPI otherwise all other systems reviewed and are negative.   Past Medical History:  Diagnosis Date   Carpal tunnel syndrome    Cocaine use    Depression    Diabetes mellitus without complication (HCC)    Endocarditis    ETOH abuse    Hypertensive urgency 04/14/2020   Neuropathy    PTSD (post-traumatic stress disorder)    Stroke (cerebrum) (HCC)    multiple   Zoster     Past Surgical History:  Procedure Laterality Date   BUBBLE STUDY  03/30/2020   Procedure: BUBBLE STUDY;  Surgeon: Jake Bathe, MD;  Location: [facility] ENDOSCOPY;  Service: Cardiovascular;;   TEE WITHOUT  CARDIOVERSION N/A 03/30/2020   Procedure: TRANSESOPHAGEAL ECHOCARDIOGRAM (TEE);   Surgeon: Jake Bathe, MD;  Location: [facility] ENDOSCOPY;  Service: Cardiovascular;  Laterality: N/A;   TESTICLE TORSION REDUCTION      Social History  reports that he has been smoking cigarettes. He has been smoking an average of .5 packs per day. He has never used smokeless tobacco. He reports current alcohol use of about 7.0 standard drinks of alcohol per week. He reports that he does not currently use drugs after having used the following drugs: Cocaine.  Allergies  Allergen Reactions   Lisinopril Swelling    Angioedema Pt reports not allergic 06/07/20   Omeprazole     Other reaction(s): ANGIOEDEMA OF LIPS, Angioedema of tongue. Pt reports not allergic 06/07/20    History reviewed. No pertinent family history.  Prior to Admission medications   Medication Sig Start Date End Date Taking? Authorizing Provider  ACCU-CHEK GUIDE test strip  10/29/21   [provider]  acetaminophen (TYLENOL) 325 MG tablet Take 1-2 tablets (325-650 mg total) by mouth every 4 (four) hours as needed for mild pain. 03/04/22   Love, Evlyn Kanner, PA-C  aspirin EC 81 MG tablet Take 1 tablet (81 mg total) by mouth daily. Swallow whole. 03/04/22   Love, Evlyn Kanner, PA-C  atorvastatin (LIPITOR) 80 MG tablet Take 1 tablet (80 mg total) by mouth daily. 03/04/22   Love, Evlyn Kanner, PA-C  blood glucose meter kit and supplies KIT Dispense based on patient and insurance preference. Use up to four times daily as directed. (FOR ICD-9 250.00, 250.01). 09/13/19   [name], MD  clopidogrel (PLAVIX) 75 MG tablet Take 1 tablet (75 mg total) by mouth daily. 03/04/22   Love, Evlyn Kanner, PA-C  DULoxetine (CYMBALTA) 60 MG capsule Take 1 capsule (60 mg total) by mouth daily. For chronic pain and mood 03/04/22   Love, Evlyn Kanner, PA-C  famotidine (PEPCID) 20 MG tablet Take 1 tablet (20 mg total) by mouth daily before breakfast. 03/04/22   Love, Evlyn Kanner, PA-C  FARXIGA 10 MG TABS tablet Take 1 tablet (10 mg total) by mouth daily. 03/04/22    Love, Evlyn Kanner, PA-C  gabapentin (NEURONTIN) 300 MG capsule Take 1 capsule (300 mg total) by mouth 2 (two) times daily. 03/04/22   Love, Evlyn Kanner, PA-C  insulin glargine (LANTUS) 100 UNIT/ML Solostar Pen Inject 5 Units into the skin daily. 03/04/22   Love, Evlyn Kanner, PA-C  Insulin Pen Needle (PEN NEEDLES) 31G X 5 MM MISC 1 Application by Does not apply route daily at 6 (six) AM. 03/04/22   Love, Evlyn Kanner, PA-C  l-methylfolate-B6-B12 (METANX) 3-35-2 MG TABS tablet Take 1 tablet by mouth daily. 03/04/22   Love, Evlyn Kanner, PA-C  magnesium gluconate (MAGONATE) 500 MG tablet Take 1 tablet (500 mg total) by mouth at bedtime. 03/04/22   Love, Evlyn Kanner, PA-C  melatonin 3 MG TABS tablet Take 2 tablets (6 mg total) by mouth at bedtime as needed (for sleep). 03/04/22   Love, Evlyn Kanner, PA-C  metFORMIN (GLUCOPHAGE) 1000 MG tablet Take 1 tablet (1,000 mg total) by mouth 2 (two) times daily with a meal. 03/04/22   Love, Evlyn Kanner, PA-C  Multiple Vitamin (MULTIVITAMIN WITH MINERALS) TABS tablet Take 1 tablet by mouth daily. 03/04/22   Love, Evlyn Kanner, PA-C  OLANZapine (ZYPREXA) 5 MG tablet Take 1 tablet (5 mg total) by mouth at bedtime. 03/04/22   Love, Evlyn Kanner, PA-C  senna-docusate (SENOKOT-S) 8.6-50 MG tablet Take  1 tablet by mouth at bedtime as needed for mild constipation. 03/04/22   Love, Evlyn Kanner, PA-C  traZODone (DESYREL) 100 MG tablet Take 1 tablet (100 mg total) by mouth at bedtime. 03/04/22   Jacquelynn Cree, PA-C    Physical Exam: Vitals:   07/16/22 1630 07/16/22 1800 07/16/22 1830 07/16/22 1900  BP: (!) 160/83 135/83 111/65 126/77  Pulse: (!) 110 98 80 80  Resp: 15 20  18   Temp: 99.1 F (37.3 C)     TempSrc: Oral     SpO2: 98% 100% 96% 100%  Weight:      Height:        Constitutional: NAD, calm, comfortable Eyes: PERRL, lids and conjunctivae normal ENMT: Mucous membranes are moist.  Respiratory: clear to auscultation bilaterally, no wheezing, no crackles. Normal respiratory effort. No accessory muscle  use.  Cardiovascular: Regular rate and rhythm,  No extremity edema. 2+ pedal pulses. No carotid bruits.  Abdomen: no tenderness, not distended, Bowel sounds positive.  Musculoskeletal: no clubbing / cyanosis. No joint deformity upper and lower extremities. Good ROM, no contractures. Normal muscle tone.  Skin: no rashes, lesions, ulcers. No induration Neurologic: CN 2-12 grossly intact. Sensation intact, Strength 5/5 in all 4.  Psychiatric: Normal judgment and insight. Alert and oriented x 3. Normal mood.    Labs on Admission: I have personally reviewed following labs and imaging studies  CBC: Recent Labs  Lab 07/16/22 1211  WBC 7.8  NEUTROABS 6.7  HGB 14.0  HCT 43.4  MCV 86.5  PLT 264    Basic Metabolic Panel: Recent Labs  Lab 07/16/22 1211 07/16/22 1730  NA 136 141  K 4.3 3.6  CL 93* 109  CO2 25 22  GLUCOSE 414* 237*  BUN 31* 23  CREATININE 1.22 0.87  CALCIUM 10.0 7.2*    GFR: Estimated Creatinine Clearance: 67.8 mL/min (by C-G formula based on SCr of 0.87 mg/dL).  Liver Function Tests: No results for input(s): "AST", "ALT", "ALKPHOS", "BILITOT", "PROT", "ALBUMIN" in the last 168 hours.  Urine analysis:    Component Value Date/Time   COLORURINE YELLOW 07/16/2022 1645   APPEARANCEUR CLEAR 07/16/2022 1645   LABSPEC 1.030 07/16/2022 1645   PHURINE 5.0 07/16/2022 1645   GLUCOSEU >=500 (A) 07/16/2022 1645   HGBUR SMALL (A) 07/16/2022 1645   BILIRUBINUR NEGATIVE 07/16/2022 1645   KETONESUR 20 (A) 07/16/2022 1645   PROTEINUR >=300 (A) 07/16/2022 1645   NITRITE NEGATIVE 07/16/2022 1645   LEUKOCYTESUR NEGATIVE 07/16/2022 1645    Radiological Exams on Admission: MR BRAIN WO CONTRAST  Result Date: 07/16/2022 CLINICAL DATA:  Neuro deficit, acute, stroke suspected. Hyperglycemia, nausea, and vomiting. EXAM: MRI HEAD WITHOUT CONTRAST TECHNIQUE: Multiplanar, multiecho pulse sequences of the brain and surrounding structures were obtained without intravenous contrast.  COMPARISON:  Head CT 07/16/2022 and MRI 02/17/2022 FINDINGS: Some sequences are moderately motion degraded. Brain: There is no evidence of an acute infarct, mass, midline shift, or extra-axial fluid collection. A chronic ACA territory infarct in the left frontal lobe was acute on the prior MRI. Chronic infarcts are again noted in the right parietal and bilateral occipital lobes as well as both cerebellar hemispheres. There is a background of moderate chronic small vessel ischemia in the cerebral white matter and pons. Wallerian degeneration is noted in the left cerebral peduncle. Scattered chronic cerebral microhemorrhages are again seen bilaterally. There is mild cerebral atrophy. Vascular: Major intracranial vascular flow voids are preserved. Skull and upper cervical spine: Unremarkable bone marrow signal. Sinuses/Orbits: Unremarkable  orbits. Mucosal thickening and/or fluid in the left sphenoid sinus and a posterior left ethmoid air cell. Clear mastoid air cells. Other: None. IMPRESSION: 1. No acute intracranial abnormality. 2. Moderate chronic small vessel ischemia with multiple chronic infarcts as above. Electronically Signed   By: Sebastian Ache M.D.   On: 07/16/2022 18:33   CT Angio Chest/Abd/Pel for Dissection W and/or Wo Contrast  Result Date: 07/16/2022 CLINICAL DATA:  Acute aortic syndrome suspected. Continuous nausea and vomiting. History of diabetes, alcohol and drug abuse, endocarditis, and strokes. EXAM: CT ANGIOGRAPHY CHEST, ABDOMEN AND PELVIS TECHNIQUE: Non-contrast CT of the chest was initially obtained. Multidetector CT imaging through the chest, abdomen and pelvis was performed using the standard protocol during bolus administration of intravenous contrast. Multiplanar reconstructed images and MIPs were obtained and reviewed to evaluate the vascular anatomy. RADIATION DOSE REDUCTION: This exam was performed according to the departmental dose-optimization program which includes automated exposure  control, adjustment of the mA and/or kV according to patient size and/or use of iterative reconstruction technique. CONTRAST:  75mL OMNIPAQUE IOHEXOL 350 MG/ML SOLN COMPARISON:  CT abdomen pelvis 02/17/2022, CTA abdomen pelvis 04/04/2021 FINDINGS: CTA CHEST FINDINGS Cardiovascular: Preferential opacification of the thoracic aorta. No evidence of thoracic aortic aneurysm or dissection. Extensive calcific atherosclerosis throughout the thoracic aorta. Multivessel coronary artery calcification. Moderate (approximately 50%) luminal narrowing of the proximal left subclavian artery secondary to hypodense plaque (series 10, image 19). Normal heart size. No pericardial effusion. Mediastinum/Nodes: No enlarged mediastinal, hilar, or axillary lymph nodes. Thyroid gland and trachea demonstrate no significant findings. Circumferential wall thickening of the distal esophagus. Lungs/Pleura: Several subpleural solid pulmonary nodules are noted in the lateral aspect of the right middle lobe, the largest measuring 0.6 x 0.5 cm (series 6, image 85). These are new compared to prior CT chest on 03/25/2020; of note, this area was not included on the field of view on the multiple interval CT abdomen pelvis exams. Inferior to the nodules is persistent subsegmental atelectasis versus scarring, similar to CT abdomen pelvis 01/23/2022 and improved compared to 04/04/2021. An additional 0.4 x 0.3 cm subpleural solid pulmonary nodule is noted in the right lower lobe (series 6, image 76). Similarly, this is also new compared to prior CT chest on 03/25/2020 but not included in the field of view on the multiple interval abdomen/pelvis exams. Mild upper lobe predominant paraseptal emphysema. Musculoskeletal: No chest wall abnormality. No acute or significant osseous findings. Review of the MIP images confirms the above findings. CTA ABDOMEN AND PELVIS FINDINGS VASCULAR Aorta: Extensive calcific atherosclerosis with mild luminal narrowing  immediately superior to the iliac bifurcation. No aneurysm, dissection, vasculitis or significant stenosis. Celiac: Patent without evidence of aneurysm, dissection, or vasculitis. Moderate short-segment luminal narrowing (approximately 50%) at the ostium secondary to calcific atherosclerosis. SMA: Patent without evidence of aneurysm, dissection, vasculitis or significant stenosis. Renals: Right renal artery is patent without evidence of severe stenosis. An inferior accessory right renal artery which supplies the inferior aspect of the right kidney is patent but diminutive in caliber along the proximal half of the vessel (series 10, image 157-159). Proximally severe short segment luminal narrowing (approximately greater than 75%) at the ostium of the left renal artery secondary to atherosclerosis (series 10, image 161. An accessory left renal artery is noted. IMA: Patent without evidence of aneurysm, dissection, or vasculitis. The ostium of the IMA is not well visualized secondary to calcific atherosclerosis. Inflow: Patent without evidence of aneurysm, dissection, or vasculitis. Short segment moderate (approximately 50%) luminal narrowing  at the origins of the bilateral common iliac arteries (series 10, image 198). The external iliac and internal iliac arteries are normal caliber. Veins: No obvious venous abnormality within the limitations of this arterial phase study. Review of the MIP images confirms the above findings. NON-VASCULAR Hepatobiliary: No focal liver abnormality is seen. No gallstones, gallbladder wall thickening, or biliary dilatation. Pancreas: Unremarkable. No pancreatic ductal dilatation or surrounding inflammatory changes. Spleen: Normal in size without focal abnormality. Adrenals/Urinary Tract: Adrenal glands are unremarkable. Two punctate nonobstructive calyceal stones in the upper and middle thirds of the right kidney. Multifocal cortical scarring bilaterally. No hydronephrosis. No suspicious  renal lesion. Hypoenhancement of the lower third of the right kidney (series 10, image 166; series 14, image 50). Urinary bladder is markedly distended, nearly to the level of the umbilicus but otherwise unremarkable. Stomach/Bowel: Stomach is within normal limits. Appendix appears normal. Diverticulosis of the descending and sigmoid colon. No evidence of bowel wall thickening, distention, or inflammatory changes. Lymphatic: No enlarged abdominopelvic lymph nodes. Reproductive: Prostatomegaly. Other: No abdominal wall hernia or abnormality. No abdominopelvic ascites. Musculoskeletal: No acute or suspicious osseous findings. Review of the MIP images confirms the above findings. IMPRESSION: VASCULAR: 1. No evidence of acute aortic syndrome as queried. Severe calcific atherosclerosis of the thoracic and abdominal aorta without aneurysm, dissection or severe stenosis. 2. Diminutive caliber of an accessory right renal artery supplying the lower third of the right kidney, with resulting acute renal infarct of the inferior pole of the right kidney. 3. Severe short segment luminal narrowing (approximately 75%) at the ostium of the left renal artery secondary to calcific atherosclerosis. 4. Moderate short segment (approximately 50%) luminal narrowing of the proximal bilateral common iliac arteries. 5. Moderate (approximately 50%) luminal narrowing of the proximal left subclavian artery secondary to hypodense plaque. NONVASCULAR: 1. Several subpleural solid pulmonary nodules are noted in right middle lobe, the largest measuring up to 0.6 cm, favored to be inflammatory in etiology given the consolidation previously noted in this area on prior exams. Non-contrast chest CT at 3-6 months is recommended. If the nodules are stable at time of repeat CT, then future CT at 18-24 months (from today's scan) is considered optional for low-risk patients, but is recommended for high-risk patients. This recommendation follows the consensus  statement: Guidelines for Management of Incidental Pulmonary Nodules Detected on CT Images: From the Fleischner Society 2017; Radiology 2017; 284:228-243. 2. Hypoenhancement of the lower third of the right kidney, as noted above, consistent with acute renal infarct. 3. Urinary bladder is markedly distended, nearly to the level of the umbilicus but otherwise unremarkable. Correlate for urinary retention. 4. Prostatomegaly. 5. Diverticulosis of the descending and sigmoid colon without findings of diverticulitis. 6. Nonobstructive right nephrolithiasis. 7. Emphysema. Aortic Atherosclerosis (ICD10-I70.0) and Emphysema (ICD10-J43.9). Electronically Signed   By: Sherron Ales M.D.   On: 07/16/2022 17:04   CT HEAD WO CONTRAST  Result Date: 07/16/2022 CLINICAL DATA:  Continuous nausea and vomiting with elevated blood sugar. History of diabetes. ETOH and drug abuse. Multiple strokes. EXAM: CT HEAD WITHOUT CONTRAST TECHNIQUE: Contiguous axial images were obtained from the base of the skull through the vertex without intravenous contrast. RADIATION DOSE REDUCTION: This exam was performed according to the departmental dose-optimization program which includes automated exposure control, adjustment of the mA and/or kV according to patient size and/or use of iterative reconstruction technique. COMPARISON:  02/17/2022 FINDINGS: Brain: Ventricles, cisterns and other CSF spaces are within normal. Evidence of old bilateral infarcts unchanged. Mild chronic ischemic microvascular  disease. No mass, mass effect or shift of midline structures. No evidence of acute hemorrhage. Vascular: No hyperdense vessel or unexpected calcification. Skull: Normal. Negative for fracture or focal lesion. Sinuses/Orbits: Orbits are normal. Minimal opacification over the left ethmoid air cells. Other: None. IMPRESSION: 1. No acute findings. 2. Old bilateral infarcts unchanged. 3. Mild chronic ischemic microvascular disease. Electronically Signed   By:  Elberta Fortis M.D.   On: 07/16/2022 16:18      Assessment/Plan Principal Problem:   Renal infarction [facility]) Active Problems:   Schizoaffective disorder (HCC)   Insulin dependent type 2 diabetes mellitus (HCC)    IDDM2, uncontrolled with hyperglycemia Was on inuslin drip in the ED initially for DKA, gap closed on repeat insulin drip discontinued by EDP Per last d/c summary from 02/2022 he was discharged on Semglee 15 units twice daily, patient could not tell me how much he does, will do semglee 10 units tonight, start ssi, adjust insulin tomorrow as needed    acute renal infarct of the inferior pole of the right kidney. Severe short segment luminal narrowing (approximately 75%) at the ostium of the left renal artery secondary to calcific atherosclerosis. Moderate short segment (approximately 50%) luminal narrowing of the proximal bilateral common iliac arteries.  Moderate (approximately 50%) luminal narrowing of the proximal left subclavian artery secondary to hypodense plaque.  Continue aspirin, Plavix, Lipitor check lipid panel  case discussed with Dr. Myra Gianotti over the phone who advise continue aspirin Plavix Lipitor, no vascular intervention recommended,recommend echo to rule out cardiac emboli for completeness, however per Dr. Myra Gianotti this is most likely atherosclerotic disease.  Per Dr. Myra Gianotti pain will eventually resolve, does not need to admit to [place], does not need vascular follow-up unless change of clinical picture.  Acute urinary retention: bph+/- neurogenic bladder from uncontrolled diabetes Urinary bladder is markedly distended, nearly to the level of the umbilicus  Drained 900 cc with in and out cath,Foley placed in the ED  CAD/CVA Continue aspirin, Plavix, Lipitor  Several subpleural solid pulmonary nodules are noted in right middle lobe,  Non-contrast chest CT at 3-6 months is recommended. If the nodules are stable at time of repeat CT, then future CT at  18-24 months (from today's scan) is considered optional for low-risk patients, but is recommended for high-risk patients. Per chart review he does have history of pulmonary cryptococcus in the past  DVT prophylaxis: Lovenox   Code Status:   full Family Communication:  Done at bedside  Patient is from: Home   Anticipated DC to: Home   Anticipated DC date:  22-48hrs, need urology follow up for urinary retention    Consults called:  Vascular surgery Admission status:  Observation  Severity of Illness:   Voice Recognition /Dragon dictation system was used to create this note, attempts have been made to correct errors. Please contact the author with questions and/or clarifications.  Albertine Grates MD PhD FACP [facility]  How to contact the [facility] Attending or Consulting provider 7A - 7P or covering provider during after hours 7P -7A, for this patient?   Check the care team in [facility] and look for a) attending/consulting [facility] provider listed and b) the [facility] team listed Log into [email or web address] and use [place]'s universal password to access. If you do not have the password, please contact the hospital operator. Locate the [facility] provider you are looking for under [facility] and page to a number that you can be directly reached. If you still have difficulty reaching the  provider, please page the [facility] (Director on Call) for the Hospitalists listed on amion for assistance.  07/16/2022, 9:56 PM

## 2022-07-16 NOTE — ED Notes (Signed)
Potassium infusion and IVF paused while pt goes to MRI.

## 2022-07-17 ENCOUNTER — Observation Stay (HOSPITAL_BASED_OUTPATIENT_CLINIC_OR_DEPARTMENT_OTHER): Payer: 59

## 2022-07-17 DIAGNOSIS — N280 Ischemia and infarction of kidney: Secondary | ICD-10-CM | POA: Diagnosis not present

## 2022-07-17 DIAGNOSIS — R9431 Abnormal electrocardiogram [ECG] [EKG]: Secondary | ICD-10-CM | POA: Diagnosis not present

## 2022-07-17 LAB — CBC
HCT: 38.3 % — ABNORMAL LOW (ref 39.0–52.0)
Hemoglobin: 12.1 g/dL — ABNORMAL LOW (ref 13.0–17.0)
MCH: 27.5 pg (ref 26.0–34.0)
MCHC: 31.6 g/dL (ref 30.0–36.0)
MCV: 87 fL (ref 80.0–100.0)
Platelets: 209 10*3/uL (ref 150–400)
RBC: 4.4 MIL/uL (ref 4.22–5.81)
RDW: 11.9 % (ref 11.5–15.5)
WBC: 7.8 10*3/uL (ref 4.0–10.5)
nRBC: 0 % (ref 0.0–0.2)

## 2022-07-17 LAB — BASIC METABOLIC PANEL
Anion gap: 10 (ref 5–15)
Anion gap: 9 (ref 5–15)
BUN: 23 mg/dL (ref 8–23)
BUN: 23 mg/dL (ref 8–23)
CO2: 28 mmol/L (ref 22–32)
CO2: 25 mmol/L (ref 22–32)
Calcium: 8.9 mg/dL (ref 8.9–10.3)
Calcium: 8.1 mg/dL — ABNORMAL LOW (ref 8.9–10.3)
Chloride: 101 mmol/L (ref 98–111)
Chloride: 105 mmol/L (ref 98–111)
Creatinine, Ser: 0.82 mg/dL (ref 0.61–1.24)
Creatinine, Ser: 0.77 mg/dL (ref 0.61–1.24)
GFR, Estimated: >60 mL/min (ref >60)
GFR, Estimated: >60 mL/min (ref >60)
Glucose, Bld: 149 mg/dL — ABNORMAL HIGH (ref 70–99)
Glucose, Bld: 138 mg/dL — ABNORMAL HIGH (ref 70–99)
Potassium: 4 mmol/L (ref 3.5–5.1)
Potassium: 3.4 mmol/L — ABNORMAL LOW (ref 3.5–5.1)
Sodium: 139 mmol/L (ref 135–145)
Sodium: 139 mmol/L (ref 135–145)

## 2022-07-17 LAB — ECHOCARDIOGRAM COMPLETE
Area-P 1/2: 1.86 cm2
Height: 66 in
MV VTI: 1.77 cm2
S' Lateral: 2.4 cm
Weight: 1844.81 oz

## 2022-07-17 LAB — BLOOD GAS, VENOUS
Acid-Base Excess: 4.5 mmol/L — ABNORMAL HIGH (ref 0.0–2.0)
Bicarbonate: 30.8 mmol/L — ABNORMAL HIGH (ref 20.0–28.0)
O2 Saturation: 66.2 %
Patient temperature: 37
pCO2, Ven: 52 mmHg (ref 44–60)
pH, Ven: 7.38 (ref 7.25–7.43)
pO2, Ven: 38 mmHg (ref 32–45)

## 2022-07-17 LAB — LIPID PANEL
Cholesterol: 342 mg/dL — ABNORMAL HIGH (ref 0–200)
HDL: 59 mg/dL (ref >40)
LDL Cholesterol: 270 mg/dL — ABNORMAL HIGH (ref 0–99)
Total CHOL/HDL Ratio: 5.8 RATIO
Triglycerides: 65 mg/dL (ref <150)
VLDL: 13 mg/dL (ref 0–40)

## 2022-07-17 LAB — GLUCOSE, CAPILLARY
Glucose-Capillary: 149 mg/dL — ABNORMAL HIGH (ref 70–99)
Glucose-Capillary: 93 mg/dL (ref 70–99)
Glucose-Capillary: 101 mg/dL — ABNORMAL HIGH (ref 70–99)
Glucose-Capillary: 363 mg/dL — ABNORMAL HIGH (ref 70–99)
Glucose-Capillary: 173 mg/dL — ABNORMAL HIGH (ref 70–99)

## 2022-07-17 LAB — VITAMIN B12: Vitamin B-12: 1730 pg/mL — ABNORMAL HIGH (ref 180–914)

## 2022-07-17 LAB — AMMONIA: Ammonia: 27 umol/L (ref 9–35)

## 2022-07-17 LAB — FOLATE: Folate: 26.5 ng/mL (ref >5.9)

## 2022-07-17 LAB — TSH: TSH: 0.717 u[IU]/mL (ref 0.350–4.500)

## 2022-07-17 MED ORDER — CLOPIDOGREL BISULFATE 75 MG PO TABS
75.0000 mg | ORAL_TABLET | Freq: Every day | ORAL | Status: DC
  Administered 2022-07-17 – 2022-07-19 (×3): 75 mg via ORAL
  Filled 2022-07-17 (×3): qty 1

## 2022-07-17 MED ORDER — MAGNESIUM GLUCONATE 500 MG PO TABS
500.0000 mg | ORAL_TABLET | Freq: Every day | ORAL | Status: DC
  Administered 2022-07-17 – 2022-07-18 (×3): 500 mg via ORAL
  Filled 2022-07-17 (×4): qty 1

## 2022-07-17 MED ORDER — TRAZODONE HCL 100 MG PO TABS
100.0000 mg | ORAL_TABLET | Freq: Every day | ORAL | Status: DC
  Administered 2022-07-17: 100 mg via ORAL
  Filled 2022-07-17: qty 1

## 2022-07-17 MED ORDER — OLANZAPINE 5 MG PO TABS
5.0000 mg | ORAL_TABLET | Freq: Every day | ORAL | Status: DC
  Administered 2022-07-17: 5 mg via ORAL
  Filled 2022-07-17: qty 1

## 2022-07-17 MED ORDER — NALOXONE HCL 0.4 MG/ML IJ SOLN
0.4000 mg | INTRAMUSCULAR | Status: DC | PRN
  Administered 2022-07-17: 0.4 mg via INTRAVENOUS

## 2022-07-17 MED ORDER — SENNOSIDES-DOCUSATE SODIUM 8.6-50 MG PO TABS: 1.0000 | ORAL_TABLET | Freq: Every evening | ORAL | Status: DC | PRN

## 2022-07-17 MED ORDER — GABAPENTIN 300 MG PO CAPS
300.0000 mg | ORAL_CAPSULE | Freq: Two times a day (BID) | ORAL | Status: DC
  Administered 2022-07-17 – 2022-07-19 (×6): 300 mg via ORAL
  Filled 2022-07-17 (×6): qty 1

## 2022-07-17 MED ORDER — TRAZODONE HCL 50 MG PO TABS
50.0000 mg | ORAL_TABLET | Freq: Every day | ORAL | Status: DC
  Administered 2022-07-17 – 2022-07-18 (×2): 50 mg via ORAL
  Filled 2022-07-17 (×2): qty 1

## 2022-07-17 MED ORDER — MELATONIN 3 MG PO TABS: 6.0000 mg | ORAL_TABLET | Freq: Every evening | ORAL | Status: DC | PRN

## 2022-07-17 MED ORDER — INSULIN GLARGINE-YFGN 100 UNIT/ML ~~LOC~~ SOLN
10.0000 [IU] | Freq: Every day | SUBCUTANEOUS | Status: DC
  Administered 2022-07-17 – 2022-07-19 (×3): 10 [IU] via SUBCUTANEOUS
  Filled 2022-07-17 (×3): qty 0.1

## 2022-07-17 MED ORDER — CHLORHEXIDINE GLUCONATE CLOTH 2 % EX PADS
6.0000 | MEDICATED_PAD | Freq: Every day | CUTANEOUS | Status: DC
  Administered 2022-07-18 – 2022-07-19 (×2): 6 via TOPICAL

## 2022-07-17 MED ORDER — FAMOTIDINE 20 MG PO TABS
20.0000 mg | ORAL_TABLET | Freq: Every day | ORAL | Status: DC
  Administered 2022-07-17 – 2022-07-19 (×3): 20 mg via ORAL
  Filled 2022-07-17 (×3): qty 1

## 2022-07-17 MED ORDER — ACETAMINOPHEN 325 MG PO TABS
325.0000 mg | ORAL_TABLET | ORAL | Status: DC | PRN
  Administered 2022-07-19: 650 mg via ORAL
  Filled 2022-07-17: qty 2

## 2022-07-17 MED ORDER — ORAL CARE MOUTH RINSE: 15.0000 mL | OROMUCOSAL | Status: DC | PRN

## 2022-07-17 MED ORDER — DIPHENHYDRAMINE HCL 12.5 MG/5ML PO ELIX
6.2500 mg | ORAL_SOLUTION | Freq: Four times a day (QID) | ORAL | Status: DC | PRN
  Administered 2022-07-18: 6.25 mg via ORAL
  Filled 2022-07-17: qty 5

## 2022-07-17 MED ORDER — DULOXETINE HCL 30 MG PO CPEP
60.0000 mg | ORAL_CAPSULE | Freq: Every day | ORAL | Status: DC
  Administered 2022-07-17 – 2022-07-19 (×3): 60 mg via ORAL
  Filled 2022-07-17 (×3): qty 2

## 2022-07-17 MED ORDER — LORATADINE 10 MG PO TABS
10.0000 mg | ORAL_TABLET | Freq: Every day | ORAL | Status: DC
  Administered 2022-07-17 – 2022-07-19 (×3): 10 mg via ORAL
  Filled 2022-07-17 (×3): qty 1

## 2022-07-17 MED ORDER — DAPAGLIFLOZIN PROPANEDIOL 10 MG PO TABS
10.0000 mg | ORAL_TABLET | Freq: Every day | ORAL | Status: DC
  Administered 2022-07-17 – 2022-07-19 (×3): 10 mg via ORAL
  Filled 2022-07-17 (×3): qty 1

## 2022-07-17 MED ORDER — NALOXONE HCL 0.4 MG/ML IJ SOLN
INTRAMUSCULAR | Status: AC
  Filled 2022-07-17: qty 1

## 2022-07-17 MED ORDER — ASPIRIN 81 MG PO TBEC
81.0000 mg | DELAYED_RELEASE_TABLET | Freq: Every day | ORAL | Status: DC
  Administered 2022-07-17 – 2022-07-19 (×3): 81 mg via ORAL
  Filled 2022-07-17 (×3): qty 1

## 2022-07-17 MED ORDER — ENOXAPARIN SODIUM 40 MG/0.4ML IJ SOSY
40.0000 mg | PREFILLED_SYRINGE | INTRAMUSCULAR | Status: DC
  Administered 2022-07-18 – 2022-07-19 (×2): 40 mg via SUBCUTANEOUS
  Filled 2022-07-17 (×3): qty 0.4

## 2022-07-17 MED ORDER — EZETIMIBE 10 MG PO TABS
10.0000 mg | ORAL_TABLET | Freq: Every day | ORAL | Status: DC
  Administered 2022-07-17 – 2022-07-19 (×3): 10 mg via ORAL
  Filled 2022-07-17 (×3): qty 1

## 2022-07-17 MED ORDER — PERFLUTREN LIPID MICROSPHERE
1.0000 mL | INTRAVENOUS | Status: AC | PRN
  Administered 2022-07-17: 4 mL via INTRAVENOUS

## 2022-07-17 MED ORDER — ATORVASTATIN CALCIUM 40 MG PO TABS
80.0000 mg | ORAL_TABLET | Freq: Every day | ORAL | Status: DC
  Administered 2022-07-17 – 2022-07-19 (×3): 80 mg via ORAL
  Filled 2022-07-17 (×3): qty 2

## 2022-07-17 NOTE — Progress Notes (Addendum)
Patient extremely drowsy today, difficult to arouse, slow response to sternal rub. Narcan given per order. Patient awoke to painful stimuli the same time narcan was administered. Status improving AxOx2-3. Vital signs charted. No signs of distress observed at this time. Will continue with plan of care.    Pls see new orders.

## 2022-07-17 NOTE — Progress Notes (Signed)
PROGRESS NOTE    William Acevedo  ZOX:[id number] DOB: 06/22/1955 DOA: 07/16/2022 PCP: Hillery Aldo, NP  Chief Complaint  Patient presents with   Hyperglycemia    Brief Narrative:   [name] is William Acevedo 67 y.o. male with medical history significant of schizoaffective disorder, polysubstance abuse, endocarditis, hypertension, hyperlipidemia, insulin-dependent type 2 diabetes, CVA, CAD status post non-STEMI presented to the ED due to nausea, vomiting, abdominal pain, back pain, and slurred speech.  He started feeling poorly and was noted to have hyperglycemia and EMS was called.  He presented to the ED on 6/9 with with confusion and hyperglycemia.    Assessment & Plan:   Principal Problem:   Renal infarction [facility]) Active Problems:   Schizoaffective disorder (HCC)   Insulin dependent type 2 diabetes mellitus (HCC)  Acute Metabolic Encephalopathy He was significantly lethargic today -> unclear etiology, related to zyprexa, melatonin, trazodone last night?  Received dose of narcan, not particularly clear to me if he awoke in response to this or the blood draw he had around the same time.  Improved at this time per my discussion with RN.  TSH wnl, BG appropriate, VBG without hypercarbia, ammonia wnl Delirium precautions Hold zyprexa for now, will reduce trazodone to 50 mg.  Hold melatonin.  Nausea  Vomiting  Abdominal and Back Pain Suspect this is related to the acute renal infarct of the inferior pole of the right kidney in addition to urinary retention Seems generally improved today Follow with mental status  Right Renal Infarct Peripheral Artery Disease Dyslipidemia Appreciate vascular assistance, they suspect due to progressive atherosclerotic vascular disease causing occlusion of accessory renal to R kidney supplying the lower third ---recommended echo and monitoring for arrhythmia.  Recommending aspirin, plavix.  Echo as below Continue tele monitoring He needs lipid  clinic follow up outpatient -> LDL 270.  Atorva 80.  Add zetia.  Urinary Retention BPH vs diabetic related UA not particularly concerning for UTI, but in setting of urinary retention follow urine culture Flomax  Needs trial of void, urology follow up   IDDM2, uncontrolled with hyperglycemia BG 391 at presentation Improving, continue 10 units semglee daily for now - continue SSI -  Per last d/c summary from 02/2022 he was discharged on Semglee 15 units twice daily   CAD/CVA Continue aspirin, Plavix, Lipitor   Several subpleural solid pulmonary nodules are noted in right middle lobe,  Non-contrast chest CT at 3-6 months is recommended. If the nodules are stable at time of repeat CT, then future CT at 18-24 months (from today's scan) is considered optional for low-risk patients, but is recommended for high-risk patients. Per chart review he does have history of pulmonary cryptococcus in the past    DVT prophylaxis: lovenox Code Status: full Family Communication: none Disposition:   Status is: Observation The patient will require care spanning > 2 midnights and should be moved to inpatient because: continued lethargy   Consultants:  none  Procedures:  Echo  IMPRESSIONS     1. Left ventricular ejection fraction, by estimation, is 70 to 75%. The  left ventricle has hyperdynamic function. The left ventricle has no  regional wall motion abnormalities. Left ventricular diastolic parameters  are consistent with Grade I diastolic  dysfunction (impaired relaxation). Mid-cavitary gradient of around 30 mm  Hg.   2. Right ventricular systolic function is normal. The right ventricular  size is normal. Tricuspid regurgitation signal is inadequate for assessing  PA pressure.   3. The mitral valve was  not well visualized. No evidence of mitral valve  regurgitation. No evidence of mitral stenosis.   4. The aortic valve was not well visualized. Aortic valve regurgitation  is not  visualized. No aortic stenosis is present.   5. The inferior vena cava is normal in size with greater than 50%  respiratory variability, suggesting right atrial pressure of 3 mmHg.   Comparison(s): No significant change from prior study.  Antimicrobials:  Anti-infectives (From admission, onward)    None       Subjective: lethargic  Objective: Vitals:   07/17/22 0443 07/17/22 0815 07/17/22 1036 07/17/22 1217  BP: (!) 140/60 (!) 144/79 (!) 150/79 102/63  Pulse: 75 77 86 83  Resp: 18 14 12 14   Temp: (!) 97.5 F (36.4 C) (!) 97.5 F (36.4 C)  (!) 97.4 F (36.3 C)  TempSrc: Axillary Oral  Axillary  SpO2: 100% 97% 100% 97%  Weight:      Height:        Intake/Output Summary (Last 24 hours) at 07/17/2022 1511 Last data filed at 07/17/2022 1439 Gross per 24 hour  Intake 3370.91 ml  Output 1300 ml  Net 2070.91 ml   Filed Weights   07/16/22 1208 07/17/22 0016  Weight: 59 kg 52.3 kg    Examination:  General exam: extremely lethargic, almost obtunded - no distress Respiratory system: unlabored Cardiovascular system: RRR Gastrointestinal system: Abdomen is nondistended, soft and nontender. Central nervous system: lethargic, only responds intermittently to loud verbal stimuli or irritative stimuli - moves all extremities - difficult to examine pupils as he tends to fight this, though appeared symmetric.   Extremities: no LEE    Data Reviewed: I have personally reviewed following labs and imaging studies  CBC: Recent Labs  Lab 07/16/22 1211 07/17/22 0122  WBC 7.8 7.8  NEUTROABS 6.7  --   HGB 14.0 12.1*  HCT 43.4 38.3*  MCV 86.5 87.0  PLT 264 209    Basic Metabolic Panel: Recent Labs  Lab 07/16/22 1211 07/16/22 1730 07/17/22 0122 07/17/22 0839  NA 136 141 139 139  K 4.3 3.6 4.0 3.4*  CL 93* 109 101 105  CO2 25 22 28 25   GLUCOSE 414* 237* 149* 138*  BUN 31* 23 23 23   CREATININE 1.22 0.87 0.82 0.77  CALCIUM 10.0 7.2* 8.9 8.1*    GFR: Estimated  Creatinine Clearance: 65.4 mL/min (by C-G formula based on SCr of 0.77 mg/dL).  Liver Function Tests: No results for input(s): "AST", "ALT", "ALKPHOS", "BILITOT", "PROT", "ALBUMIN" in the last 168 hours.  CBG: Recent Labs  Lab 07/16/22 1919 07/16/22 2154 07/17/22 0727 07/17/22 1034 07/17/22 1226  GLUCAP 147* 77 149* 93 101*     Recent Results (from the past 240 hour(s))  SARS Coronavirus 2 by RT PCR (hospital order, performed in [facility] hospital lab) *cepheid single result test* Anterior Nasal Swab     Status: None   Collection Time: 07/16/22  5:40 PM   Specimen: Anterior Nasal Swab  Result Value Ref Range Status   SARS Coronavirus 2 by RT PCR NEGATIVE NEGATIVE Final    Comment: (NOTE) SARS-CoV-2 target nucleic acids are NOT DETECTED.  The SARS-CoV-2 RNA is generally detectable in upper and lower respiratory specimens during the acute phase of infection. The lowest concentration of SARS-CoV-2 viral copies this assay can detect is 250 copies / mL. Grady Mohabir negative result does not preclude SARS-CoV-2 infection and should not be used as the sole basis for treatment or other patient management decisions.  Dejanee Thibeaux negative result may occur with improper specimen collection / handling, submission of specimen other than nasopharyngeal swab, presence of viral mutation(s) within the areas targeted by this assay, and inadequate number of viral copies (<250 copies / mL). Jaidon Ellery negative result must be combined with clinical observations, patient history, and epidemiological information.  Fact Sheet for Patients:   RoadLapTop.co.za  Fact Sheet for Healthcare Providers: http://kim-miller.com/  This test is not yet approved or  cleared by the [place] FDA and has been authorized for detection and/or diagnosis of SARS-CoV-2 by FDA under an Emergency Use Authorization (EUA).  This EUA will remain in effect (meaning this test can be used) for the  duration of the COVID-19 declaration under Section 564(b)(1) of the Act, 21 U.S.C. section 360bbb-3(b)(1), unless the authorization is terminated or revoked sooner.  Performed at [facility], [address] [place], [place] [address]          Radiology Studies: ECHOCARDIOGRAM COMPLETE  Result Date: 07/17/2022    ECHOCARDIOGRAM REPORT   Patient Name:   William BENZIE Date of Exam: 07/17/2022 Medical Rec #:  [id number]           Height:       66.0 in Accession #:    [id number]          Weight:       115.3 lb Date of Birth:  December 28, 1955           BSA:          1.582 m Patient Age:    67 years            BP:           150/79 mmHg Patient Gender: M                   HR:           84 bpm. Exam Location:  Inpatient Procedure: 2D Echo, Cardiac Doppler, Color Doppler and Intracardiac            Opacification Agent Indications:    Abnormal ECG  History:        Patient has prior history of Echocardiogram examinations, most                 recent 02/18/2022. Previous Myocardial Infarction and CAD, Stroke,                 Endocarditis; Risk Factors:Hypertension, Dyslipidemia, Diabetes                 and Current Smoker. Polysubstance abuse, ETOH.  Sonographer:    Milda Smart Referring Phys: [id number] HQIO XU  Sonographer Comments: Suboptimal parasternal window and suboptimal apical window. Image acquisition challenging due to patient body habitus, Image acquisition challenging due to respiratory motion and Image acquisition challenging due to uncooperative patient. IMPRESSIONS  1. Left ventricular ejection fraction, by estimation, is 70 to 75%. The left ventricle has hyperdynamic function. The left ventricle has no regional wall motion abnormalities. Left ventricular diastolic parameters are consistent with Grade I diastolic dysfunction (impaired relaxation). Mid-cavitary gradient of around 30 mm Hg.  2. Right ventricular systolic function is normal. The right ventricular size is normal.  Tricuspid regurgitation signal is inadequate for assessing PA pressure.  3. The mitral valve was not well visualized. No evidence of mitral valve regurgitation. No evidence of mitral stenosis.  4. The aortic valve was not well visualized. Aortic valve regurgitation is not visualized. No aortic stenosis  is present.  5. The inferior vena cava is normal in size with greater than 50% respiratory variability, suggesting right atrial pressure of 3 mmHg. Comparison(s): No significant change from prior study. FINDINGS  Left Ventricle: Left ventricular ejection fraction, by estimation, is 70 to 75%. The left ventricle has hyperdynamic function. The left ventricle has no regional wall motion abnormalities. Definity contrast agent was given IV to delineate the left ventricular endocardial borders. The left ventricular internal cavity size was normal in size. There is no left ventricular hypertrophy. Left ventricular diastolic parameters are consistent with Grade I diastolic dysfunction (impaired relaxation). Right Ventricle: The right ventricular size is normal. No increase in right ventricular wall thickness. Right ventricular systolic function is normal. Tricuspid regurgitation signal is inadequate for assessing PA pressure. Left Atrium: Left atrial size was normal in size. Right Atrium: Right atrial size was normal in size. Pericardium: Trivial pericardial effusion is present. Mitral Valve: The mitral valve was not well visualized. No evidence of mitral valve regurgitation. No evidence of mitral valve stenosis. MV peak gradient, 8.6 mmHg. The mean mitral valve gradient is 4.0 mmHg. Tricuspid Valve: The tricuspid valve is not well visualized. Tricuspid valve regurgitation is not demonstrated. No evidence of tricuspid stenosis. Aortic Valve: The aortic valve was not well visualized. Aortic valve regurgitation is not visualized. No aortic stenosis is present. Pulmonic Valve: The pulmonic valve was not well visualized. Pulmonic  valve regurgitation is not visualized. No evidence of pulmonic stenosis. Aorta: The aortic root is normal in size and structure. Venous: The inferior vena cava is normal in size with greater than 50% respiratory variability, suggesting right atrial pressure of 3 mmHg. IAS/Shunts: No atrial level shunt detected by color flow Doppler.  LEFT VENTRICLE PLAX 2D LVIDd:         3.20 cm   Diastology LVIDs:         2.40 cm   LV e' medial:    2.72 cm/s LV PW:         0.80 cm   LV E/e' medial:  34.3 LV IVS:        0.90 cm   LV e' lateral:   3.92 cm/s LVOT diam:     2.00 cm   LV E/e' lateral: 23.8 LV SV:         55 LV SV Index:   35 LVOT Area:     3.14 cm  RIGHT VENTRICLE             IVC RV S prime:     12.10 cm/s  IVC diam: 0.90 cm TAPSE (M-mode): 2.0 cm LEFT ATRIUM             Index        RIGHT ATRIUM          Index LA diam:        2.70 cm 1.71 cm/m   RA Area:     6.74 cm LA Vol (A2C):   22.5 ml 14.22 ml/m  RA Volume:   10.50 ml 6.64 ml/m LA Vol (A4C):   12.6 ml 7.96 ml/m LA Biplane Vol: 17.5 ml 11.06 ml/m  AORTIC VALVE LVOT Vmax:   110.00 cm/s LVOT Vmean:  77.400 cm/s LVOT VTI:    0.176 m  AORTA Ao Root diam: 3.30 cm MITRAL VALVE MV Area (PHT): 1.86 cm     SHUNTS MV Area VTI:   1.77 cm     Systemic VTI:  0.18 m MV Peak grad:  8.6 mmHg  Systemic Diam: 2.00 cm MV Mean grad:  4.0 mmHg MV Vmax:       1.47 m/s MV Vmean:      93.1 cm/s MV Decel Time: 408 msec MV E velocity: 93.20 cm/s MV Tyri Elmore velocity: 137.00 cm/s MV E/Lai Hendriks ratio:  0.68 Vishnu Priya Mallipeddi Electronically signed by Winfield Rast Mallipeddi Signature Date/Time: 07/17/2022/1:14:02 PM    Final    MR BRAIN WO CONTRAST  Result Date: 07/16/2022 CLINICAL DATA:  Neuro deficit, acute, stroke suspected. Hyperglycemia, nausea, and vomiting. EXAM: MRI HEAD WITHOUT CONTRAST TECHNIQUE: Multiplanar, multiecho pulse sequences of the brain and surrounding structures were obtained without intravenous contrast. COMPARISON:  Head CT 07/16/2022 and MRI 02/17/2022 FINDINGS:  Some sequences are moderately motion degraded. Brain: There is no evidence of an acute infarct, mass, midline shift, or extra-axial fluid collection. Eleftherios Dudenhoeffer chronic ACA territory infarct in the left frontal lobe was acute on the prior MRI. Chronic infarcts are again noted in the right parietal and bilateral occipital lobes as well as both cerebellar hemispheres. There is Aijalon Kirtz background of moderate chronic small vessel ischemia in the cerebral white matter and pons. Wallerian degeneration is noted in the left cerebral peduncle. Scattered chronic cerebral microhemorrhages are again seen bilaterally. There is mild cerebral atrophy. Vascular: Major intracranial vascular flow voids are preserved. Skull and upper cervical spine: Unremarkable bone marrow signal. Sinuses/Orbits: Unremarkable orbits. Mucosal thickening and/or fluid in the left sphenoid sinus and Shia Delaine posterior left ethmoid air cell. Clear mastoid air cells. Other: None. IMPRESSION: 1. No acute intracranial abnormality. 2. Moderate chronic small vessel ischemia with multiple chronic infarcts as above. Electronically Signed   By: Sebastian Ache M.D.   On: 07/16/2022 18:33   CT Angio Chest/Abd/Pel for Dissection W and/or Wo Contrast  Result Date: 07/16/2022 CLINICAL DATA:  Acute aortic syndrome suspected. Continuous nausea and vomiting. History of diabetes, alcohol and drug abuse, endocarditis, and strokes. EXAM: CT ANGIOGRAPHY CHEST, ABDOMEN AND PELVIS TECHNIQUE: Non-contrast CT of the chest was initially obtained. Multidetector CT imaging through the chest, abdomen and pelvis was performed using the standard protocol during bolus administration of intravenous contrast. Multiplanar reconstructed images and MIPs were obtained and reviewed to evaluate the vascular anatomy. RADIATION DOSE REDUCTION: This exam was performed according to the departmental dose-optimization program which includes automated exposure control, adjustment of the mA and/or kV according to patient  size and/or use of iterative reconstruction technique. CONTRAST:  75mL OMNIPAQUE IOHEXOL 350 MG/ML SOLN COMPARISON:  CT abdomen pelvis 02/17/2022, CTA abdomen pelvis 04/04/2021 FINDINGS: CTA CHEST FINDINGS Cardiovascular: Preferential opacification of the thoracic aorta. No evidence of thoracic aortic aneurysm or dissection. Extensive calcific atherosclerosis throughout the thoracic aorta. Multivessel coronary artery calcification. Moderate (approximately 50%) luminal narrowing of the proximal left subclavian artery secondary to hypodense plaque (series 10, image 19). Normal heart size. No pericardial effusion. Mediastinum/Nodes: No enlarged mediastinal, hilar, or axillary lymph nodes. Thyroid gland and trachea demonstrate no significant findings. Circumferential wall thickening of the distal esophagus. Lungs/Pleura: Several subpleural solid pulmonary nodules are noted in the lateral aspect of the right middle lobe, the largest measuring 0.6 x 0.5 cm (series 6, image 85). These are new compared to prior CT chest on 03/25/2020; of note, this area was not included on the field of view on the multiple interval CT abdomen pelvis exams. Inferior to the nodules is persistent subsegmental atelectasis versus scarring, similar to CT abdomen pelvis 01/23/2022 and improved compared to 04/04/2021. An additional 0.4 x 0.3 cm subpleural solid pulmonary nodule is noted  in the right lower lobe (series 6, image 76). Similarly, this is also new compared to prior CT chest on 03/25/2020 but not included in the field of view on the multiple interval abdomen/pelvis exams. Mild upper lobe predominant paraseptal emphysema. Musculoskeletal: No chest wall abnormality. No acute or significant osseous findings. Review of the MIP images confirms the above findings. CTA ABDOMEN AND PELVIS FINDINGS VASCULAR Aorta: Extensive calcific atherosclerosis with mild luminal narrowing immediately superior to the iliac bifurcation. No aneurysm, dissection,  vasculitis or significant stenosis. Celiac: Patent without evidence of aneurysm, dissection, or vasculitis. Moderate short-segment luminal narrowing (approximately 50%) at the ostium secondary to calcific atherosclerosis. SMA: Patent without evidence of aneurysm, dissection, vasculitis or significant stenosis. Renals: Right renal artery is patent without evidence of severe stenosis. An inferior accessory right renal artery which supplies the inferior aspect of the right kidney is patent but diminutive in caliber along the proximal half of the vessel (series 10, image 157-159). Proximally severe short segment luminal narrowing (approximately greater than 75%) at the ostium of the left renal artery secondary to atherosclerosis (series 10, image 161. An accessory left renal artery is noted. IMA: Patent without evidence of aneurysm, dissection, or vasculitis. The ostium of the IMA is not well visualized secondary to calcific atherosclerosis. Inflow: Patent without evidence of aneurysm, dissection, or vasculitis. Short segment moderate (approximately 50%) luminal narrowing at the origins of the bilateral common iliac arteries (series 10, image 198). The external iliac and internal iliac arteries are normal caliber. Veins: No obvious venous abnormality within the limitations of this arterial phase study. Review of the MIP images confirms the above findings. NON-VASCULAR Hepatobiliary: No focal liver abnormality is seen. No gallstones, gallbladder wall thickening, or biliary dilatation. Pancreas: Unremarkable. No pancreatic ductal dilatation or surrounding inflammatory changes. Spleen: Normal in size without focal abnormality. Adrenals/Urinary Tract: Adrenal glands are unremarkable. Two punctate nonobstructive calyceal stones in the upper and middle thirds of the right kidney. Multifocal cortical scarring bilaterally. No hydronephrosis. No suspicious renal lesion. Hypoenhancement of the lower third of the right kidney  (series 10, image 166; series 14, image 50). Urinary bladder is markedly distended, nearly to the level of the umbilicus but otherwise unremarkable. Stomach/Bowel: Stomach is within normal limits. Appendix appears normal. Diverticulosis of the descending and sigmoid colon. No evidence of bowel wall thickening, distention, or inflammatory changes. Lymphatic: No enlarged abdominopelvic lymph nodes. Reproductive: Prostatomegaly. Other: No abdominal wall hernia or abnormality. No abdominopelvic ascites. Musculoskeletal: No acute or suspicious osseous findings. Review of the MIP images confirms the above findings. IMPRESSION: VASCULAR: 1. No evidence of acute aortic syndrome as queried. Severe calcific atherosclerosis of the thoracic and abdominal aorta without aneurysm, dissection or severe stenosis. 2. Diminutive caliber of an accessory right renal artery supplying the lower third of the right kidney, with resulting acute renal infarct of the inferior pole of the right kidney. 3. Severe short segment luminal narrowing (approximately 75%) at the ostium of the left renal artery secondary to calcific atherosclerosis. 4. Moderate short segment (approximately 50%) luminal narrowing of the proximal bilateral common iliac arteries. 5. Moderate (approximately 50%) luminal narrowing of the proximal left subclavian artery secondary to hypodense plaque. NONVASCULAR: 1. Several subpleural solid pulmonary nodules are noted in right middle lobe, the largest measuring up to 0.6 cm, favored to be inflammatory in etiology given the consolidation previously noted in this area on prior exams. Non-contrast chest CT at 3-6 months is recommended. If the nodules are stable at time of repeat CT, then  future CT at 18-24 months (from today's scan) is considered optional for low-risk patients, but is recommended for high-risk patients. This recommendation follows the consensus statement: Guidelines for Management of Incidental Pulmonary Nodules  Detected on CT Images: From the Fleischner Society 2017; Radiology 2017; 284:228-243. 2. Hypoenhancement of the lower third of the right kidney, as noted above, consistent with acute renal infarct. 3. Urinary bladder is markedly distended, nearly to the level of the umbilicus but otherwise unremarkable. Correlate for urinary retention. 4. Prostatomegaly. 5. Diverticulosis of the descending and sigmoid colon without findings of diverticulitis. 6. Nonobstructive right nephrolithiasis. 7. Emphysema. Aortic Atherosclerosis (ICD10-I70.0) and Emphysema (ICD10-J43.9). Electronically Signed   By: Sherron Ales M.D.   On: 07/16/2022 17:04   CT HEAD WO CONTRAST  Result Date: 07/16/2022 CLINICAL DATA:  Continuous nausea and vomiting with elevated blood sugar. History of diabetes. ETOH and drug abuse. Multiple strokes. EXAM: CT HEAD WITHOUT CONTRAST TECHNIQUE: Contiguous axial images were obtained from the base of the skull through the vertex without intravenous contrast. RADIATION DOSE REDUCTION: This exam was performed according to the departmental dose-optimization program which includes automated exposure control, adjustment of the mA and/or kV according to patient size and/or use of iterative reconstruction technique. COMPARISON:  02/17/2022 FINDINGS: Brain: Ventricles, cisterns and other CSF spaces are within normal. Evidence of old bilateral infarcts unchanged. Mild chronic ischemic microvascular disease. No mass, mass effect or shift of midline structures. No evidence of acute hemorrhage. Vascular: No hyperdense vessel or unexpected calcification. Skull: Normal. Negative for fracture or focal lesion. Sinuses/Orbits: Orbits are normal. Minimal opacification over the left ethmoid air cells. Other: None. IMPRESSION: 1. No acute findings. 2. Old bilateral infarcts unchanged. 3. Mild chronic ischemic microvascular disease. Electronically Signed   By: Elberta Fortis M.D.   On: 07/16/2022 16:18        Scheduled Meds:   aspirin EC  81 mg Oral Daily   atorvastatin  80 mg Oral Daily   Chlorhexidine Gluconate Cloth  6 each Topical Daily   clopidogrel  75 mg Oral Daily   dapagliflozin propanediol  10 mg Oral Daily   DULoxetine  60 mg Oral Daily   enoxaparin (LOVENOX) injection  40 mg Subcutaneous Q24H   famotidine  20 mg Oral QAC breakfast   gabapentin  300 mg Oral BID   insulin aspart  0-15 Units Subcutaneous TID WC   magnesium gluconate  500 mg Oral QHS   OLANZapine  5 mg Oral QHS   traZODone  100 mg Oral QHS   Continuous Infusions:  sodium chloride 125 mL/hr at 07/17/22 0652     LOS: 0 days    Time spent: over 30 min    Lacretia Nicks, MD [facility]   To contact the attending provider between 7A-7P or the covering provider during after hours 7P-7A, please log into the web site [email or web address] and access using universal [place] password for that web site. If you do not have the password, please call the hospital operator.  07/17/2022, 3:11 PM

## 2022-07-17 NOTE — Plan of Care (Signed)
  Problem: Safety: Goal: Ability to remain free from injury will improve Outcome: Progressing   

## 2022-07-17 NOTE — Progress Notes (Signed)
  Echocardiogram 2D Echocardiogram has been performed.  William Acevedo 07/17/2022, 12:14 PM

## 2022-07-17 NOTE — Progress Notes (Signed)
Patient resting comfortably in bed. Consumed 100% lunch and dinner.

## 2022-07-18 DIAGNOSIS — N280 Ischemia and infarction of kidney: Secondary | ICD-10-CM | POA: Diagnosis not present

## 2022-07-18 LAB — GLUCOSE, CAPILLARY
Glucose-Capillary: 133 mg/dL — ABNORMAL HIGH (ref 70–99)
Glucose-Capillary: 178 mg/dL — ABNORMAL HIGH (ref 70–99)
Glucose-Capillary: 204 mg/dL — ABNORMAL HIGH (ref 70–99)
Glucose-Capillary: 147 mg/dL — ABNORMAL HIGH (ref 70–99)
Glucose-Capillary: 372 mg/dL — ABNORMAL HIGH (ref 70–99)

## 2022-07-18 LAB — CBC WITH DIFFERENTIAL/PLATELET
Abs Immature Granulocytes: 0.01 10*3/uL (ref 0.00–0.07)
Basophils Absolute: 0 10*3/uL (ref 0.0–0.1)
Basophils Relative: 0 %
Eosinophils Absolute: 0 10*3/uL (ref 0.0–0.5)
Eosinophils Relative: 0 %
HCT: 30.9 % — ABNORMAL LOW (ref 39.0–52.0)
Hemoglobin: 9.8 g/dL — ABNORMAL LOW (ref 13.0–17.0)
Immature Granulocytes: 0 %
Lymphocytes Relative: 33 %
Lymphs Abs: 1.9 10*3/uL (ref 0.7–4.0)
MCH: 28.2 pg (ref 26.0–34.0)
MCHC: 31.7 g/dL (ref 30.0–36.0)
MCV: 89 fL (ref 80.0–100.0)
Monocytes Absolute: 0.4 10*3/uL (ref 0.1–1.0)
Monocytes Relative: 7 %
Neutro Abs: 3.3 10*3/uL (ref 1.7–7.7)
Neutrophils Relative %: 60 %
Platelets: 174 10*3/uL (ref 150–400)
RBC: 3.47 MIL/uL — ABNORMAL LOW (ref 4.22–5.81)
RDW: 11.9 % (ref 11.5–15.5)
WBC: 5.6 10*3/uL (ref 4.0–10.5)
nRBC: 0 % (ref 0.0–0.2)

## 2022-07-18 LAB — COMPREHENSIVE METABOLIC PANEL
ALT: 18 U/L (ref 0–44)
AST: 23 U/L (ref 15–41)
Albumin: 2.7 g/dL — ABNORMAL LOW (ref 3.5–5.0)
Alkaline Phosphatase: 44 U/L (ref 38–126)
Anion gap: 8 (ref 5–15)
BUN: 24 mg/dL — ABNORMAL HIGH (ref 8–23)
CO2: 24 mmol/L (ref 22–32)
Calcium: 8 mg/dL — ABNORMAL LOW (ref 8.9–10.3)
Chloride: 105 mmol/L (ref 98–111)
Creatinine, Ser: 1 mg/dL (ref 0.61–1.24)
GFR, Estimated: >60 mL/min (ref >60)
Glucose, Bld: 157 mg/dL — ABNORMAL HIGH (ref 70–99)
Potassium: 3.5 mmol/L (ref 3.5–5.1)
Sodium: 137 mmol/L (ref 135–145)
Total Bilirubin: 0.5 mg/dL (ref 0.3–1.2)
Total Protein: 5.3 g/dL — ABNORMAL LOW (ref 6.5–8.1)

## 2022-07-18 LAB — MAGNESIUM: Magnesium: 2.2 mg/dL (ref 1.7–2.4)

## 2022-07-18 LAB — PHOSPHORUS: Phosphorus: 2.9 mg/dL (ref 2.5–4.6)

## 2022-07-18 LAB — C-REACTIVE PROTEIN: CRP: 1.1 mg/dL — ABNORMAL HIGH (ref <1.0)

## 2022-07-18 MED ORDER — INSULIN ASPART 100 UNIT/ML IJ SOLN
7.0000 [IU] | Freq: Once | INTRAMUSCULAR | Status: AC
  Administered 2022-07-18: 7 [IU] via SUBCUTANEOUS

## 2022-07-18 NOTE — TOC Initial Note (Addendum)
Transition of Care  [facility]) - Initial/Assessment Note    Patient Details  Name: William Acevedo MRN: [id number] Date of Birth: [birth year]  Transition of Care [facility]) CM/SW Contact:    Howell Rucks, RN Phone Number: 07/18/2022, 10:21 AM  Clinical Narrative: PT eval pending, await recommendation. TOC will continue to follow.      -3:03pm Met with pt at bedisde to introduce role of TOC/NCM and review for dc planning. Pt reports he has a PCP and pharmacy in place, pt reports he lives with his brother who assist with his care and provides transportation. P reports he has a walker at home, no home care services. Pt had no questions/concerns. TOC will continue to follow.                       Patient Goals and CMS Choice            Expected Discharge Plan and Services                                              Prior Living Arrangements/Services                       Activities of Daily Living Home Assistive Devices/Equipment: Eyeglasses, [place] Mor (specify quad or straight) ADL Screening (condition at time of admission) Patient's cognitive ability adequate to safely complete daily activities?: Yes Is the patient deaf or have difficulty hearing?: No Does the patient have difficulty seeing, even when wearing glasses/contacts?: No Does the patient have difficulty concentrating, remembering, or making decisions?: Yes Patient able to express need for assistance with ADLs?: Yes Does the patient have difficulty dressing or bathing?: Yes Independently performs ADLs?: No Communication: Independent Dressing (OT): Needs assistance Is this a change from baseline?: Pre-admission baseline Grooming: Needs assistance Is this a change from baseline?: Pre-admission baseline Feeding: Needs assistance Is this a change from baseline?: Pre-admission baseline Bathing: Needs assistance Is this a change from baseline?: Pre-admission baseline Toileting: Needs assistance Is this  a change from baseline?: Pre-admission baseline In/Out Bed: Needs assistance Is this a change from baseline?: Pre-admission baseline Walks in Home: Needs assistance (pt states pt uses cane at home but he expresses that he needs help and stay mostly at home) Does the patient have difficulty walking or climbing stairs?: Yes Weakness of Legs: Both Weakness of Arms/Hands: None  Permission Sought/Granted                  Emotional Assessment              Admission diagnosis:  Renal infarction (HCC) [N28.0] Renal infarct (HCC) [N28.0] Generalized abdominal pain [R10.84] Hyperglycemia [R73.9] Acute urinary retention [R33.8] Patient Active Problem List   Diagnosis Date Noted   Insulin dependent type 2 diabetes mellitus (HCC) 07/16/2022   Schizoaffective disorder (HCC) 11/10/2021   Alcohol use, unspecified with intoxication, unspecified (HCC) 11/10/2021   Cocaine abuse with intoxication, unspecified (HCC) 11/10/2021   Antisocial personality disorder (HCC) 11/10/2021   Cannabis dependence, continuous abuse (HCC) 11/10/2021   Opioid abuse (HCC) 11/10/2021   Post-traumatic stress disorder, chronic 11/10/2021   Schizophrenia, unspecified (HCC) 11/10/2021   CVA (cerebral vascular accident) (HCC) 07/09/2020   Acute ischemic multifocal multiple vascular territories stroke (HCC) 06/06/2020   Seizure-like activity (HCC) 06/06/2020   Cocaine abuse (HCC)    ARF (  acute renal failure) (HCC) 04/14/2020   Acute blood loss anemia 04/14/2020   GI bleed 04/14/2020   Nausea & vomiting 04/14/2020   Acute metabolic encephalopathy 04/14/2020   Hypertensive urgency 04/14/2020   Abdominal pain 04/02/2020   Protein-calorie malnutrition, severe 03/27/2020   Cerebral thrombosis with cerebral infarction 03/26/2020   NSTEMI (non-ST elevated myocardial infarction) (HCC) 03/25/2020   Secondary diabetes mellitus with HHNC (hyperglycemia hyperosmolar non-ketotic coma) (HCC) 03/25/2020   Elevated  troponin    Renal infarction [facility])    Cerebrovascular accident (CVA) (HCC)    Cocaine use disorder, severe, dependence (HCC) 04/08/2019   Major depressive disorder 04/08/2019   Epiglottitis 04/07/2019   Cocaine use 04/07/2019   Acute encephalopathy 04/07/2019   Overdose 07/06/2015   AKI (acute kidney injury) (HCC) 07/06/2015   Polysubstance abuse (HCC) 07/06/2015   DM2 (diabetes mellitus, type 2) (HCC) 07/06/2015   Dehydration    MDD (major depressive disorder), recurrent episode, mild (HCC)    Alcohol use disorder    PCP:  Hillery Aldo, NP Pharmacy:   [facility] [phone] [place], [place] - [address] [place] [address] Phone: [phone] Fax: [phone]  [facility] - [address] [place] - [address] [place] [address] Phone: [phone] Fax: [phone]  [facility] [address] [place], [place] - [address] [place] [address] Phone: [phone] Fax: [phone]     Social Determinants of Health (SDOH) Social History: SDOH Screenings   Food Insecurity: Patient Unable To Answer (07/17/2022)  Housing: Medium Risk (07/17/2022)  Transportation Needs: Unmet Transportation Needs (07/17/2022)  Utilities: Not At Risk (07/17/2022)  Tobacco Use: High Risk (07/16/2022)   SDOH Interventions:     Readmission Risk Interventions    04/19/2020    2:34 PM  Readmission Risk Prevention Plan  Transportation Screening Complete  Medication Review (RN Care Manager) Complete  PCP or Specialist appointment within 3-5 days of discharge Complete  HRI or Home Care Consult Complete  SW Recovery Care/Counseling Consult Complete  Palliative Care Screening Not Applicable  Skilled Nursing Facility Not Applicable

## 2022-07-18 NOTE — Progress Notes (Signed)
PROGRESS NOTE    William Acevedo  WUJ:[id number] DOB: [birth year] DOA: 07/16/2022 PCP: Hillery Aldo, NP  Chief Complaint  Patient presents with   Hyperglycemia    Brief Narrative:   William Acevedo is William Acevedo 67 y.o. male with medical history significant of schizoaffective disorder, polysubstance abuse, endocarditis, hypertension, hyperlipidemia, insulin-dependent type 2 diabetes, CVA, CAD status post non-STEMI presented to the ED due to nausea, vomiting, abdominal pain, back pain, and slurred speech.  He started feeling poorly and was noted to have hyperglycemia and EMS was called.  He presented to the ED on 6/9 with with confusion and hyperglycemia.    Assessment & Plan:   Principal Problem:   Renal infarction [facility]) Active Problems:   Schizoaffective disorder (HCC)   Insulin dependent type 2 diabetes mellitus (HCC)  Acute Metabolic Encephalopathy  Cognitive Deficit He was significantly lethargic today -> unclear etiology, related to zyprexa, melatonin, trazodone last night?  Received dose of narcan, not particularly clear to me if he awoke in response to this or the blood draw he had around the same time.  Improved at this time per my discussion with RN.  Unclear why he had this episode with these meds that seem to be chronic? He's currently improved, suspect he has chronic cognitive deficit  TSH wnl, BG appropriate, VBG without hypercarbia, ammonia wnl Delirium precautions Hold zyprexa for now, will reduce trazodone to 50 mg.  Hold melatonin.  Nausea  Vomiting  Abdominal and Back Pain Suspect this is related to the acute renal infarct of the inferior pole of the right kidney in addition to urinary retention improved  Right Renal Infarct Peripheral Artery Disease Dyslipidemia Appreciate vascular assistance, they suspect due to progressive atherosclerotic vascular disease causing occlusion of accessory renal to R kidney supplying the lower third ---recommended echo and  monitoring for arrhythmia.  Recommending aspirin, plavix.  Echo as below Continue tele monitoring He needs lipid clinic follow up outpatient -> LDL 270.  Atorva 80.  Add zetia.  Urinary Retention BPH vs diabetic related UA not particularly concerning for UTI, but in setting of urinary retention follow urine culture Flomax  Needs trial of void, urology follow up   IDDM2, uncontrolled with hyperglycemia BG 391 at presentation Improving, continue 10 units semglee daily for now - continue SSI -  Per last d/c summary from 02/2022 he was discharged on Semglee 15 units twice daily (but apparently taking lispro 15 units TID at home with glipizide? - no basal?)   CAD/CVA Continue aspirin, Plavix, Lipitor   Several subpleural solid pulmonary nodules are noted in right middle lobe,  Non-contrast chest CT at 3-6 months is recommended. If the nodules are stable at time of repeat CT, then future CT at 18-24 months (from today's scan) is considered optional for low-risk patients, but is recommended for high-risk patients. Per chart review he does have history of pulmonary cryptococcus in the past    DVT prophylaxis: lovenox Code Status: full Family Communication: none Disposition:   Status is: Observation The patient will require care spanning > 2 midnights and should be moved to inpatient because: continued lethargy   Consultants:  none  Procedures:  Echo  IMPRESSIONS     1. Left ventricular ejection fraction, by estimation, is 70 to 75%. The  left ventricle has hyperdynamic function. The left ventricle has no  regional wall motion abnormalities. Left ventricular diastolic parameters  are consistent with Grade I diastolic  dysfunction (impaired relaxation). Mid-cavitary gradient of around 30 mm  Hg.   2. Right ventricular systolic function is normal. The right ventricular  size is normal. Tricuspid regurgitation signal is inadequate for assessing  PA pressure.   3. The mitral  valve was not well visualized. No evidence of mitral valve  regurgitation. No evidence of mitral stenosis.   4. The aortic valve was not well visualized. Aortic valve regurgitation  is not visualized. No aortic stenosis is present.   5. The inferior vena cava is normal in size with greater than 50%  respiratory variability, suggesting right atrial pressure of 3 mmHg.   Comparison(s): No significant change from prior study.  Antimicrobials:  Anti-infectives (From admission, onward)    None       Subjective: Alert, no complaints Pleasantly confused   Objective: Vitals:   07/17/22 0443 07/17/22 0815 07/17/22 1036 07/17/22 1217  BP: (!) 140/60 (!) 144/79 (!) 150/79 102/63  Pulse: 75 77 86 83  Resp: 18 14 12 14   Temp: (!) 97.5 F (36.4 C) (!) 97.5 F (36.4 C)  (!) 97.4 F (36.3 C)  TempSrc: Axillary Oral  Axillary  SpO2: 100% 97% 100% 97%  Weight:      Height:        Intake/Output Summary (Last 24 hours) at 07/17/2022 1511 Last data filed at 07/17/2022 1439 Gross per 24 hour  Intake 3370.91 ml  Output 1300 ml  Net 2070.91 ml   Filed Weights   07/16/22 1208 07/17/22 0016  Weight: 59 kg 52.3 kg    Examination:  General: No acute distress. Cardiovascular: RRR Lungs: unlabored Neurological: Alert, pleasantly confused. Moves all extremities 4 with equal strength. Cranial nerves II through XII grossly intact. Extremities: No clubbing or cyanosis. No edema.   Data Reviewed: I have personally reviewed following labs and imaging studies  CBC: Recent Labs  Lab 07/16/22 1211 07/17/22 0122  WBC 7.8 7.8  NEUTROABS 6.7  --   HGB 14.0 12.1*  HCT 43.4 38.3*  MCV 86.5 87.0  PLT 264 209    Basic Metabolic Panel: Recent Labs  Lab 07/16/22 1211 07/16/22 1730 07/17/22 0122 07/17/22 0839  NA 136 141 139 139  K 4.3 3.6 4.0 3.4*  CL 93* 109 101 105  CO2 25 22 28 25   GLUCOSE 414* 237* 149* 138*  BUN 31* 23 23 23   CREATININE 1.22 0.87 0.82 0.77  CALCIUM 10.0 7.2*  8.9 8.1*    GFR: Estimated Creatinine Clearance: 65.4 mL/min (by C-G formula based on SCr of 0.77 mg/dL).  Liver Function Tests: No results for input(s): "AST", "ALT", "ALKPHOS", "BILITOT", "PROT", "ALBUMIN" in the last 168 hours.  CBG: Recent Labs  Lab 07/16/22 1919 07/16/22 2154 07/17/22 0727 07/17/22 1034 07/17/22 1226  GLUCAP 147* 77 149* 93 101*     Recent Results (from the past 240 hour(s))  SARS Coronavirus 2 by RT PCR (hospital order, performed in [facility] hospital lab) *cepheid single result test* Anterior Nasal Swab     Status: None   Collection Time: 07/16/22  5:40 PM   Specimen: Anterior Nasal Swab  Result Value Ref Range Status   SARS Coronavirus 2 by RT PCR NEGATIVE NEGATIVE Final    Comment: (NOTE) SARS-CoV-2 target nucleic acids are NOT DETECTED.  The SARS-CoV-2 RNA is generally detectable in upper and lower respiratory specimens during the acute phase of infection. The lowest concentration of SARS-CoV-2 viral copies this assay can detect is 250 copies / mL. Delia Slatten negative result does not preclude SARS-CoV-2 infection and should not be used as  the sole basis for treatment or other patient management decisions.  Tallen Schnorr negative result may occur with improper specimen collection / handling, submission of specimen other than nasopharyngeal swab, presence of viral mutation(s) within the areas targeted by this assay, and inadequate number of viral copies (<250 copies / mL). Keylin Ferryman negative result must be combined with clinical observations, patient history, and epidemiological information.  Fact Sheet for Patients:   RoadLapTop.co.za  Fact Sheet for Healthcare Providers: http://kim-miller.com/  This test is not yet approved or  cleared by the [place] FDA and has been authorized for detection and/or diagnosis of SARS-CoV-2 by FDA under an Emergency Use Authorization (EUA).  This EUA will remain in effect (meaning this  test can be used) for the duration of the COVID-19 declaration under Section 564(b)(1) of the Act, 21 U.S.C. section 360bbb-3(b)(1), unless the authorization is terminated or revoked sooner.  Performed at [facility], [address] [place], [place] [address]          Radiology Studies: ECHOCARDIOGRAM COMPLETE  Result Date: 07/17/2022    ECHOCARDIOGRAM REPORT   Patient Name:   ELIAJAH BRENAN Date of Exam: 07/17/2022 Medical Rec #:  [id number]           Height:       66.0 in Accession #:    [id number]          Weight:       115.3 lb Date of Birth:  [birth year]           BSA:          1.582 m Patient Age:    67 years            BP:           150/79 mmHg Patient Gender: M                   HR:           84 bpm. Exam Location:  Inpatient Procedure: 2D Echo, Cardiac Doppler, Color Doppler and Intracardiac            Opacification Agent Indications:    Abnormal ECG  History:        Patient has prior history of Echocardiogram examinations, most                 recent 02/18/2022. Previous Myocardial Infarction and CAD, Stroke,                 Endocarditis; Risk Factors:Hypertension, Dyslipidemia, Diabetes                 and Current Smoker. Polysubstance abuse, ETOH.  Sonographer:    Milda Smart Referring Phys: [id number] HQIO XU  Sonographer Comments: Suboptimal parasternal window and suboptimal apical window. Image acquisition challenging due to patient body habitus, Image acquisition challenging due to respiratory motion and Image acquisition challenging due to uncooperative patient. IMPRESSIONS  1. Left ventricular ejection fraction, by estimation, is 70 to 75%. The left ventricle has hyperdynamic function. The left ventricle has no regional wall motion abnormalities. Left ventricular diastolic parameters are consistent with Grade I diastolic dysfunction (impaired relaxation). Mid-cavitary gradient of around 30 mm Hg.  2. Right ventricular systolic function is normal. The right  ventricular size is normal. Tricuspid regurgitation signal is inadequate for assessing PA pressure.  3. The mitral valve was not well visualized. No evidence of mitral valve regurgitation. No evidence of mitral stenosis.  4. The aortic valve was not  well visualized. Aortic valve regurgitation is not visualized. No aortic stenosis is present.  5. The inferior vena cava is normal in size with greater than 50% respiratory variability, suggesting right atrial pressure of 3 mmHg. Comparison(s): No significant change from prior study. FINDINGS  Left Ventricle: Left ventricular ejection fraction, by estimation, is 70 to 75%. The left ventricle has hyperdynamic function. The left ventricle has no regional wall motion abnormalities. Definity contrast agent was given IV to delineate the left ventricular endocardial borders. The left ventricular internal cavity size was normal in size. There is no left ventricular hypertrophy. Left ventricular diastolic parameters are consistent with Grade I diastolic dysfunction (impaired relaxation). Right Ventricle: The right ventricular size is normal. No increase in right ventricular wall thickness. Right ventricular systolic function is normal. Tricuspid regurgitation signal is inadequate for assessing PA pressure. Left Atrium: Left atrial size was normal in size. Right Atrium: Right atrial size was normal in size. Pericardium: Trivial pericardial effusion is present. Mitral Valve: The mitral valve was not well visualized. No evidence of mitral valve regurgitation. No evidence of mitral valve stenosis. MV peak gradient, 8.6 mmHg. The mean mitral valve gradient is 4.0 mmHg. Tricuspid Valve: The tricuspid valve is not well visualized. Tricuspid valve regurgitation is not demonstrated. No evidence of tricuspid stenosis. Aortic Valve: The aortic valve was not well visualized. Aortic valve regurgitation is not visualized. No aortic stenosis is present. Pulmonic Valve: The pulmonic valve was  not well visualized. Pulmonic valve regurgitation is not visualized. No evidence of pulmonic stenosis. Aorta: The aortic root is normal in size and structure. Venous: The inferior vena cava is normal in size with greater than 50% respiratory variability, suggesting right atrial pressure of 3 mmHg. IAS/Shunts: No atrial level shunt detected by color flow Doppler.  LEFT VENTRICLE PLAX 2D LVIDd:         3.20 cm   Diastology LVIDs:         2.40 cm   LV e' medial:    2.72 cm/s LV PW:         0.80 cm   LV E/e' medial:  34.3 LV IVS:        0.90 cm   LV e' lateral:   3.92 cm/s LVOT diam:     2.00 cm   LV E/e' lateral: 23.8 LV SV:         55 LV SV Index:   35 LVOT Area:     3.14 cm  RIGHT VENTRICLE             IVC RV S prime:     12.10 cm/s  IVC diam: 0.90 cm TAPSE (M-mode): 2.0 cm LEFT ATRIUM             Index        RIGHT ATRIUM          Index LA diam:        2.70 cm 1.71 cm/m   RA Area:     6.74 cm LA Vol (A2C):   22.5 ml 14.22 ml/m  RA Volume:   10.50 ml 6.64 ml/m LA Vol (A4C):   12.6 ml 7.96 ml/m LA Biplane Vol: 17.5 ml 11.06 ml/m  AORTIC VALVE LVOT Vmax:   110.00 cm/s LVOT Vmean:  77.400 cm/s LVOT VTI:    0.176 m  AORTA Ao Root diam: 3.30 cm MITRAL VALVE MV Area (PHT): 1.86 cm     SHUNTS MV Area VTI:   1.77 cm     Systemic  VTI:  0.18 m MV Peak grad:  8.6 mmHg     Systemic Diam: 2.00 cm MV Mean grad:  4.0 mmHg MV Vmax:       1.47 m/s MV Vmean:      93.1 cm/s MV Decel Time: 408 msec MV E velocity: 93.20 cm/s MV Kailan Laws velocity: 137.00 cm/s MV E/Darris Carachure ratio:  0.68 Vishnu Priya Mallipeddi Electronically signed by Winfield Rast Mallipeddi Signature Date/Time: 07/17/2022/1:14:02 PM    Final    MR BRAIN WO CONTRAST  Result Date: 07/16/2022 CLINICAL DATA:  Neuro deficit, acute, stroke suspected. Hyperglycemia, nausea, and vomiting. EXAM: MRI HEAD WITHOUT CONTRAST TECHNIQUE: Multiplanar, multiecho pulse sequences of the brain and surrounding structures were obtained without intravenous contrast. COMPARISON:  Head CT  07/16/2022 and MRI 02/17/2022 FINDINGS: Some sequences are moderately motion degraded. Brain: There is no evidence of an acute infarct, mass, midline shift, or extra-axial fluid collection. Arora Coakley chronic ACA territory infarct in the left frontal lobe was acute on the prior MRI. Chronic infarcts are again noted in the right parietal and bilateral occipital lobes as well as both cerebellar hemispheres. There is Jazlen Ogarro background of moderate chronic small vessel ischemia in the cerebral white matter and pons. Wallerian degeneration is noted in the left cerebral peduncle. Scattered chronic cerebral microhemorrhages are again seen bilaterally. There is mild cerebral atrophy. Vascular: Major intracranial vascular flow voids are preserved. Skull and upper cervical spine: Unremarkable bone marrow signal. Sinuses/Orbits: Unremarkable orbits. Mucosal thickening and/or fluid in the left sphenoid sinus and Dorothie Wah posterior left ethmoid air cell. Clear mastoid air cells. Other: None. IMPRESSION: 1. No acute intracranial abnormality. 2. Moderate chronic small vessel ischemia with multiple chronic infarcts as above. Electronically Signed   By: Sebastian Ache M.D.   On: 07/16/2022 18:33   CT Angio Chest/Abd/Pel for Dissection W and/or Wo Contrast  Result Date: 07/16/2022 CLINICAL DATA:  Acute aortic syndrome suspected. Continuous nausea and vomiting. History of diabetes, alcohol and drug abuse, endocarditis, and strokes. EXAM: CT ANGIOGRAPHY CHEST, ABDOMEN AND PELVIS TECHNIQUE: Non-contrast CT of the chest was initially obtained. Multidetector CT imaging through the chest, abdomen and pelvis was performed using the standard protocol during bolus administration of intravenous contrast. Multiplanar reconstructed images and MIPs were obtained and reviewed to evaluate the vascular anatomy. RADIATION DOSE REDUCTION: This exam was performed according to the departmental dose-optimization program which includes automated exposure control, adjustment  of the mA and/or kV according to patient size and/or use of iterative reconstruction technique. CONTRAST:  75mL OMNIPAQUE IOHEXOL 350 MG/ML SOLN COMPARISON:  CT abdomen pelvis 02/17/2022, CTA abdomen pelvis 04/04/2021 FINDINGS: CTA CHEST FINDINGS Cardiovascular: Preferential opacification of the thoracic aorta. No evidence of thoracic aortic aneurysm or dissection. Extensive calcific atherosclerosis throughout the thoracic aorta. Multivessel coronary artery calcification. Moderate (approximately 50%) luminal narrowing of the proximal left subclavian artery secondary to hypodense plaque (series 10, image 19). Normal heart size. No pericardial effusion. Mediastinum/Nodes: No enlarged mediastinal, hilar, or axillary lymph nodes. Thyroid gland and trachea demonstrate no significant findings. Circumferential wall thickening of the distal esophagus. Lungs/Pleura: Several subpleural solid pulmonary nodules are noted in the lateral aspect of the right middle lobe, the largest measuring 0.6 x 0.5 cm (series 6, image 85). These are new compared to prior CT chest on 03/25/2020; of note, this area was not included on the field of view on the multiple interval CT abdomen pelvis exams. Inferior to the nodules is persistent subsegmental atelectasis versus scarring, similar to CT abdomen pelvis 01/23/2022 and improved compared  to 04/04/2021. An additional 0.4 x 0.3 cm subpleural solid pulmonary nodule is noted in the right lower lobe (series 6, image 76). Similarly, this is also new compared to prior CT chest on 03/25/2020 but not included in the field of view on the multiple interval abdomen/pelvis exams. Mild upper lobe predominant paraseptal emphysema. Musculoskeletal: No chest wall abnormality. No acute or significant osseous findings. Review of the MIP images confirms the above findings. CTA ABDOMEN AND PELVIS FINDINGS VASCULAR Aorta: Extensive calcific atherosclerosis with mild luminal narrowing immediately superior to the  iliac bifurcation. No aneurysm, dissection, vasculitis or significant stenosis. Celiac: Patent without evidence of aneurysm, dissection, or vasculitis. Moderate short-segment luminal narrowing (approximately 50%) at the ostium secondary to calcific atherosclerosis. SMA: Patent without evidence of aneurysm, dissection, vasculitis or significant stenosis. Renals: Right renal artery is patent without evidence of severe stenosis. An inferior accessory right renal artery which supplies the inferior aspect of the right kidney is patent but diminutive in caliber along the proximal half of the vessel (series 10, image 157-159). Proximally severe short segment luminal narrowing (approximately greater than 75%) at the ostium of the left renal artery secondary to atherosclerosis (series 10, image 161. An accessory left renal artery is noted. IMA: Patent without evidence of aneurysm, dissection, or vasculitis. The ostium of the IMA is not well visualized secondary to calcific atherosclerosis. Inflow: Patent without evidence of aneurysm, dissection, or vasculitis. Short segment moderate (approximately 50%) luminal narrowing at the origins of the bilateral common iliac arteries (series 10, image 198). The external iliac and internal iliac arteries are normal caliber. Veins: No obvious venous abnormality within the limitations of this arterial phase study. Review of the MIP images confirms the above findings. NON-VASCULAR Hepatobiliary: No focal liver abnormality is seen. No gallstones, gallbladder wall thickening, or biliary dilatation. Pancreas: Unremarkable. No pancreatic ductal dilatation or surrounding inflammatory changes. Spleen: Normal in size without focal abnormality. Adrenals/Urinary Tract: Adrenal glands are unremarkable. Two punctate nonobstructive calyceal stones in the upper and middle thirds of the right kidney. Multifocal cortical scarring bilaterally. No hydronephrosis. No suspicious renal lesion. Hypoenhancement  of the lower third of the right kidney (series 10, image 166; series 14, image 50). Urinary bladder is markedly distended, nearly to the level of the umbilicus but otherwise unremarkable. Stomach/Bowel: Stomach is within normal limits. Appendix appears normal. Diverticulosis of the descending and sigmoid colon. No evidence of bowel wall thickening, distention, or inflammatory changes. Lymphatic: No enlarged abdominopelvic lymph nodes. Reproductive: Prostatomegaly. Other: No abdominal wall hernia or abnormality. No abdominopelvic ascites. Musculoskeletal: No acute or suspicious osseous findings. Review of the MIP images confirms the above findings. IMPRESSION: VASCULAR: 1. No evidence of acute aortic syndrome as queried. Severe calcific atherosclerosis of the thoracic and abdominal aorta without aneurysm, dissection or severe stenosis. 2. Diminutive caliber of an accessory right renal artery supplying the lower third of the right kidney, with resulting acute renal infarct of the inferior pole of the right kidney. 3. Severe short segment luminal narrowing (approximately 75%) at the ostium of the left renal artery secondary to calcific atherosclerosis. 4. Moderate short segment (approximately 50%) luminal narrowing of the proximal bilateral common iliac arteries. 5. Moderate (approximately 50%) luminal narrowing of the proximal left subclavian artery secondary to hypodense plaque. NONVASCULAR: 1. Several subpleural solid pulmonary nodules are noted in right middle lobe, the largest measuring up to 0.6 cm, favored to be inflammatory in etiology given the consolidation previously noted in this area on prior exams. Non-contrast chest CT at 3-6  months is recommended. If the nodules are stable at time of repeat CT, then future CT at 18-24 months (from today's scan) is considered optional for low-risk patients, but is recommended for high-risk patients. This recommendation follows the consensus statement: Guidelines for  Management of Incidental Pulmonary Nodules Detected on CT Images: From the Fleischner Society 2017; Radiology 2017; 284:228-243. 2. Hypoenhancement of the lower third of the right kidney, as noted above, consistent with acute renal infarct. 3. Urinary bladder is markedly distended, nearly to the level of the umbilicus but otherwise unremarkable. Correlate for urinary retention. 4. Prostatomegaly. 5. Diverticulosis of the descending and sigmoid colon without findings of diverticulitis. 6. Nonobstructive right nephrolithiasis. 7. Emphysema. Aortic Atherosclerosis (ICD10-I70.0) and Emphysema (ICD10-J43.9). Electronically Signed   By: Sherron Ales M.D.   On: 07/16/2022 17:04   CT HEAD WO CONTRAST  Result Date: 07/16/2022 CLINICAL DATA:  Continuous nausea and vomiting with elevated blood sugar. History of diabetes. ETOH and drug abuse. Multiple strokes. EXAM: CT HEAD WITHOUT CONTRAST TECHNIQUE: Contiguous axial images were obtained from the base of the skull through the vertex without intravenous contrast. RADIATION DOSE REDUCTION: This exam was performed according to the departmental dose-optimization program which includes automated exposure control, adjustment of the mA and/or kV according to patient size and/or use of iterative reconstruction technique. COMPARISON:  02/17/2022 FINDINGS: Brain: Ventricles, cisterns and other CSF spaces are within normal. Evidence of old bilateral infarcts unchanged. Mild chronic ischemic microvascular disease. No mass, mass effect or shift of midline structures. No evidence of acute hemorrhage. Vascular: No hyperdense vessel or unexpected calcification. Skull: Normal. Negative for fracture or focal lesion. Sinuses/Orbits: Orbits are normal. Minimal opacification over the left ethmoid air cells. Other: None. IMPRESSION: 1. No acute findings. 2. Old bilateral infarcts unchanged. 3. Mild chronic ischemic microvascular disease. Electronically Signed   By: Elberta Fortis M.D.   On:  07/16/2022 16:18        Scheduled Meds:  aspirin EC  81 mg Oral Daily   atorvastatin  80 mg Oral Daily   Chlorhexidine Gluconate Cloth  6 each Topical Daily   clopidogrel  75 mg Oral Daily   dapagliflozin propanediol  10 mg Oral Daily   DULoxetine  60 mg Oral Daily   enoxaparin (LOVENOX) injection  40 mg Subcutaneous Q24H   famotidine  20 mg Oral QAC breakfast   gabapentin  300 mg Oral BID   insulin aspart  0-15 Units Subcutaneous TID WC   magnesium gluconate  500 mg Oral QHS   OLANZapine  5 mg Oral QHS   traZODone  100 mg Oral QHS   Continuous Infusions:  sodium chloride 125 mL/hr at 07/17/22 0652     LOS: 0 days    Time spent: over 30 min    Lacretia Nicks, MD [facility]   To contact the attending provider between 7A-7P or the covering provider during after hours 7P-7A, please log into the web site [email or web address] and access using universal [employer] password for that web site. If you do not have the password, please call the hospital operator.  07/17/2022, 3:11 PM

## 2022-07-18 NOTE — Evaluation (Signed)
Physical Therapy Evaluation Patient Details Name: William Acevedo MRN: [id number] DOB: 06/10/1955 Today's Date: 07/18/2022  History of Present Illness  67 yo male presents to therapy s/p hospital admission on 07/16/2022 due to c/o blurred vision,slurred speech, abdominal and back pain, N and V with episode of hyperglycemia with blood glucose reportedly in 500s in home setting. Pt had recent admission to hospital on 06/26/2022 for similar S and S. Pt presented to ED with blood glucose of 414. MRI of brain and no acute findings. CTA chest and abdomen revealed abn findings including pulmonary nodules, acute renal infarct, diverticulosis, nephrolithiasis, aortic atherosclerosis and emphysema. Pt PMH includes but is not limited to: CTS, substance abuse, DM II, neuropathy, PTSD, CVA, CAD, NSTEMI, endocarditis, HTN, HDL and schizoaffective disorder.  Clinical Impression    Pt admitted with above diagnosis.  Pt currently with functional limitations due to the deficits listed below (see PT Problem List). Pt in bed when PT arrived. Pt agreeable to therapy intervention, pt stated he would need some help. Pt is a poor historian with difficulty with communication at baseline. Pt states his brother helps him at home. Pt required mod A for supine to sit, min A for STS from elevated EOB, posterior LOB with initial standing balance at RW with pt limiting WB though anterior portion of B feet, gait 2 feet with RW and min A limited due to reports of dizziness. Pt seated in recliner and dizziness subsided, pt left seated in recliner all needs met and nursing staff aware. Pt will benefit from acute skilled PT to increase their independence and safety with mobility to allow discharge.         Assistance Recommended at Discharge Intermittent Supervision/Assistance  If plan is discharge home, recommend the following:  Can travel by private vehicle  A little help with walking and/or transfers;A little help with  bathing/dressing/bathroom;Assistance with cooking/housework;Direct supervision/assist for medications management;Direct supervision/assist for financial management;Assist for transportation;Help with stairs or ramp for entrance        Equipment Recommendations Rolling walker (2 wheels)  Recommendations for Other Services       Functional Status Assessment Patient has had a recent decline in their functional status and demonstrates the ability to make significant improvements in function in a reasonable and predictable amount of time.     Precautions / Restrictions Precautions Precautions: Fall Restrictions Weight Bearing Restrictions: No      Mobility  Bed Mobility Overal bed mobility: Needs Assistance Bed Mobility: Supine to Sit     Supine to sit: Mod assist, HOB elevated     General bed mobility comments: encouragement, A for B LEs to EOB and trunk, use of bed rails and mod A to scoot with use of bed pad to EOB    Transfers Overall transfer level: Needs assistance Equipment used: Rolling walker (2 wheels) Transfers: Sit to/from Stand Sit to Stand: Min assist, From elevated surface (min A from elevated EOB, mod A from recliner)           General transfer comment: cues for proper UE placement, pt reports his brother just picks him up and ed provided on importance of increased IND with functional mobility tasks and pt will require reinforcement for proper technique    Ambulation/Gait Ambulation/Gait assistance: Min assist Gait Distance (Feet): 2 Feet Assistive device: Rolling walker (2 wheels) Gait Pattern/deviations: Step-to pattern, Shuffle, Trunk flexed Gait velocity: decreased     General Gait Details: pt demonstrated posterior LOB with initital standing with pt limiting  anterior foot WB and requried min A to recover, min A for cues and RW management, gait limited due to reports of dizziness, pt may benefit from orthostatic assement if reports of dizziness  continue, dizziness subsided once seated in recliner, pt reports use of SPC for gait PLOF  Stairs            Wheelchair Mobility     Tilt Bed    Modified Rankin (Stroke Patients Only)       Balance Overall balance assessment: Needs assistance (unclear of fall hx) Sitting-balance support: Feet supported, Bilateral upper extremity supported Sitting balance-Leahy Scale: Poor   Postural control: Posterior lean Standing balance support: Bilateral upper extremity supported, During functional activity, Reliant on assistive device for balance Standing balance-Leahy Scale: Poor                               Pertinent Vitals/Pain Pain Assessment Pain Assessment: No/denies pain    Home Living Family/patient expects to be discharged to:: Private residence Living Arrangements: Other relatives Available Help at Discharge: Family;Available 24 hours/day Type of Home: House Home Access: Ramped entrance       Home Layout: One level Home Equipment: BSC/3in1;Shower seat;Cane - single point;Crutches;Wheelchair - manual Additional Comments: pt is a poor historian, pt report living with his brother whom provides some assist for ADLs and functional mobility tasks    Prior Function Prior Level of Function : Needs assist  Cognitive Assist : ADLs (cognitive)   ADLs (Cognitive): Intermittent cues Physical Assist : Mobility (physical);ADLs (physical) Mobility (physical): Transfers;Gait ADLs (physical): Bathing;Dressing;IADLs (lower body dressing) Mobility Comments: pt reports getting up OOB and amb with SPC to bathroom       Hand Dominance        Extremity/Trunk Assessment        Lower Extremity Assessment Lower Extremity Assessment: Overall WFL for tasks assessed    Cervical / Trunk Assessment Cervical / Trunk Assessment:  (wfl)  Communication   Communication: Expressive difficulties  Cognition Arousal/Alertness: Awake/alert Behavior During Therapy: WFL for  tasks assessed/performed Overall Cognitive Status: Within Functional Limits for tasks assessed                                          General Comments      Exercises     Assessment/Plan    PT Assessment Patient needs continued PT services  PT Problem List Decreased strength;Decreased activity tolerance;Decreased balance;Decreased mobility;Decreased coordination;Decreased knowledge of use of DME;Decreased safety awareness       PT Treatment Interventions DME instruction;Gait training;Functional mobility training;Therapeutic activities;Therapeutic exercise;Balance training;Neuromuscular re-education;Patient/family education    PT Goals (Current goals can be found in the Care Plan section)  Acute Rehab PT Goals Patient Stated Goal: to go home PT Goal Formulation: With patient Time For Goal Achievement: 08/01/22 Potential to Achieve Goals: Good    Frequency Min 1X/week     Co-evaluation               AM-PAC PT "6 Clicks" Mobility  Outcome Measure Help needed turning from your back to your side while in a flat bed without using bedrails?: A Little Help needed moving from lying on your back to sitting on the side of a flat bed without using bedrails?: A Lot Help needed moving to and from a bed to a chair (including  a wheelchair)?: A Lot Help needed standing up from a chair using your arms (e.g., wheelchair or bedside chair)?: A Lot Help needed to walk in hospital room?: A Little Help needed climbing 3-5 steps with a railing? : Total 6 Click Score: 13    End of Session Equipment Utilized During Treatment: Gait belt Activity Tolerance: Treatment limited secondary to medical complications (Comment);Patient limited by fatigue (reports of dizziness) Patient left: in chair;with call bell/phone within reach;with chair alarm set Nurse Communication: Mobility status;Other (comment) (pt reported somthing was wrong with upper lip) PT Visit Diagnosis:  Unsteadiness on feet (R26.81);Other abnormalities of gait and mobility (R26.89);Muscle weakness (generalized) (M62.81);Other symptoms and signs involving the nervous system (R29.898)    Time: 1610-9604 PT Time Calculation (min) (ACUTE ONLY): 43 min   Charges:   PT Evaluation $PT Eval Low Complexity: 1 Low PT Treatments $Gait Training: 8-22 mins $Therapeutic Activity: 8-22 mins PT General Charges $$ ACUTE PT VISIT: 1 Visit         Johnny Bridge, PT Acute Rehab   William Acevedo 07/18/2022, 1:57 PM

## 2022-07-18 NOTE — Progress Notes (Signed)
Mobility Specialist - Progress Note   07/18/22 1222  Mobility  Activity  (bed mobility)  Level of Assistance Minimal assist, patient does 75% or more  Range of Motion/Exercises Active Assistive  Activity Response Tolerated well  $Mobility charge 1 Mobility  Mobility Specialist Start Time (ACUTE ONLY) 1207  Mobility Specialist Stop Time (ACUTE ONLY) 1222  Mobility Specialist Time Calculation (min) (ACUTE ONLY) 15 min   Pt was found in bed and agreeable to bed exercises. Pt was able to perform the exercises below with min-A. Was left in bed afterwards with all needs met and call bell in reach.  Supine Exercises: 10 reps each  1) Ankle Pumps  2) Heel Slides   3) Hip Abduction /Adduction   4) Straight Leg Raise   5) Shoulder Taps  Billey Chang Mobility Specialist

## 2022-07-19 DIAGNOSIS — N319 Neuromuscular dysfunction of bladder, unspecified: Secondary | ICD-10-CM | POA: Diagnosis present

## 2022-07-19 DIAGNOSIS — I252 Old myocardial infarction: Secondary | ICD-10-CM | POA: Diagnosis not present

## 2022-07-19 DIAGNOSIS — F17210 Nicotine dependence, cigarettes, uncomplicated: Secondary | ICD-10-CM | POA: Diagnosis present

## 2022-07-19 DIAGNOSIS — E785 Hyperlipidemia, unspecified: Secondary | ICD-10-CM | POA: Diagnosis present

## 2022-07-19 DIAGNOSIS — F4310 Post-traumatic stress disorder, unspecified: Secondary | ICD-10-CM | POA: Diagnosis present

## 2022-07-19 DIAGNOSIS — E1165 Type 2 diabetes mellitus with hyperglycemia: Secondary | ICD-10-CM | POA: Diagnosis present

## 2022-07-19 DIAGNOSIS — Z7984 Long term (current) use of oral hypoglycemic drugs: Secondary | ICD-10-CM | POA: Diagnosis not present

## 2022-07-19 DIAGNOSIS — I2510 Atherosclerotic heart disease of native coronary artery without angina pectoris: Secondary | ICD-10-CM | POA: Diagnosis present

## 2022-07-19 DIAGNOSIS — I10 Essential (primary) hypertension: Secondary | ICD-10-CM | POA: Diagnosis present

## 2022-07-19 DIAGNOSIS — N280 Ischemia and infarction of kidney: Secondary | ICD-10-CM | POA: Diagnosis present

## 2022-07-19 DIAGNOSIS — Z79899 Other long term (current) drug therapy: Secondary | ICD-10-CM | POA: Diagnosis not present

## 2022-07-19 DIAGNOSIS — R339 Retention of urine, unspecified: Secondary | ICD-10-CM | POA: Diagnosis present

## 2022-07-19 DIAGNOSIS — Z7982 Long term (current) use of aspirin: Secondary | ICD-10-CM | POA: Diagnosis not present

## 2022-07-19 DIAGNOSIS — G9341 Metabolic encephalopathy: Secondary | ICD-10-CM | POA: Diagnosis present

## 2022-07-19 DIAGNOSIS — Z888 Allergy status to other drugs, medicaments and biological substances status: Secondary | ICD-10-CM | POA: Diagnosis not present

## 2022-07-19 DIAGNOSIS — Z8673 Personal history of transient ischemic attack (TIA), and cerebral infarction without residual deficits: Secondary | ICD-10-CM | POA: Diagnosis not present

## 2022-07-19 DIAGNOSIS — Z8679 Personal history of other diseases of the circulatory system: Secondary | ICD-10-CM | POA: Diagnosis not present

## 2022-07-19 DIAGNOSIS — F259 Schizoaffective disorder, unspecified: Secondary | ICD-10-CM | POA: Diagnosis present

## 2022-07-19 DIAGNOSIS — Z7902 Long term (current) use of antithrombotics/antiplatelets: Secondary | ICD-10-CM | POA: Diagnosis not present

## 2022-07-19 DIAGNOSIS — Z1152 Encounter for screening for COVID-19: Secondary | ICD-10-CM | POA: Diagnosis not present

## 2022-07-19 DIAGNOSIS — Z794 Long term (current) use of insulin: Secondary | ICD-10-CM | POA: Diagnosis not present

## 2022-07-19 DIAGNOSIS — R739 Hyperglycemia, unspecified: Secondary | ICD-10-CM | POA: Diagnosis present

## 2022-07-19 DIAGNOSIS — R918 Other nonspecific abnormal finding of lung field: Secondary | ICD-10-CM | POA: Diagnosis present

## 2022-07-19 DIAGNOSIS — E1151 Type 2 diabetes mellitus with diabetic peripheral angiopathy without gangrene: Secondary | ICD-10-CM | POA: Diagnosis present

## 2022-07-19 LAB — GLUCOSE, CAPILLARY
Glucose-Capillary: 243 mg/dL — ABNORMAL HIGH (ref 70–99)
Glucose-Capillary: 239 mg/dL — ABNORMAL HIGH (ref 70–99)
Glucose-Capillary: 146 mg/dL — ABNORMAL HIGH (ref 70–99)

## 2022-07-19 LAB — PHOSPHORUS: Phosphorus: 4 mg/dL (ref 2.5–4.6)

## 2022-07-19 LAB — COMPREHENSIVE METABOLIC PANEL
ALT: 18 U/L (ref 0–44)
AST: 19 U/L (ref 15–41)
Albumin: 2.6 g/dL — ABNORMAL LOW (ref 3.5–5.0)
Alkaline Phosphatase: 48 U/L (ref 38–126)
Anion gap: 7 (ref 5–15)
BUN: 22 mg/dL (ref 8–23)
CO2: 24 mmol/L (ref 22–32)
Calcium: 8 mg/dL — ABNORMAL LOW (ref 8.9–10.3)
Chloride: 103 mmol/L (ref 98–111)
Creatinine, Ser: 1.02 mg/dL (ref 0.61–1.24)
GFR, Estimated: >60 mL/min (ref >60)
Glucose, Bld: 297 mg/dL — ABNORMAL HIGH (ref 70–99)
Potassium: 3.7 mmol/L (ref 3.5–5.1)
Sodium: 134 mmol/L — ABNORMAL LOW (ref 135–145)
Total Bilirubin: 0.4 mg/dL (ref 0.3–1.2)
Total Protein: 5.5 g/dL — ABNORMAL LOW (ref 6.5–8.1)

## 2022-07-19 LAB — CBC WITH DIFFERENTIAL/PLATELET
Abs Immature Granulocytes: 0.01 10*3/uL (ref 0.00–0.07)
Basophils Absolute: 0 10*3/uL (ref 0.0–0.1)
Basophils Relative: 0 %
Eosinophils Absolute: 0 10*3/uL (ref 0.0–0.5)
Eosinophils Relative: 0 %
HCT: 30.8 % — ABNORMAL LOW (ref 39.0–52.0)
Hemoglobin: 9.7 g/dL — ABNORMAL LOW (ref 13.0–17.0)
Immature Granulocytes: 0 %
Lymphocytes Relative: 36 %
Lymphs Abs: 1.6 10*3/uL (ref 0.7–4.0)
MCH: 27.9 pg (ref 26.0–34.0)
MCHC: 31.5 g/dL (ref 30.0–36.0)
MCV: 88.5 fL (ref 80.0–100.0)
Monocytes Absolute: 0.5 10*3/uL (ref 0.1–1.0)
Monocytes Relative: 11 %
Neutro Abs: 2.4 10*3/uL (ref 1.7–7.7)
Neutrophils Relative %: 53 %
Platelets: 160 10*3/uL (ref 150–400)
RBC: 3.48 MIL/uL — ABNORMAL LOW (ref 4.22–5.81)
RDW: 11.9 % (ref 11.5–15.5)
WBC: 4.6 10*3/uL (ref 4.0–10.5)
nRBC: 0 % (ref 0.0–0.2)

## 2022-07-19 LAB — URINE CULTURE

## 2022-07-19 LAB — MAGNESIUM: Magnesium: 2.3 mg/dL (ref 1.7–2.4)

## 2022-07-19 MED ORDER — ATORVASTATIN CALCIUM 80 MG PO TABS: 80.0000 mg | ORAL_TABLET | Freq: Every day | ORAL | 1 refills | Status: AC

## 2022-07-19 MED ORDER — METOPROLOL SUCCINATE ER 50 MG PO TB24: 50.0000 mg | ORAL_TABLET | Freq: Every day | ORAL | 1 refills | Status: AC

## 2022-07-19 MED ORDER — FAMOTIDINE 20 MG PO TABS: 20.0000 mg | ORAL_TABLET | Freq: Every day | ORAL | 1 refills | Status: AC

## 2022-07-19 MED ORDER — DULOXETINE HCL 60 MG PO CPEP: 60.0000 mg | ORAL_CAPSULE | Freq: Every day | ORAL | 1 refills | Status: AC

## 2022-07-19 MED ORDER — TAMSULOSIN HCL 0.4 MG PO CAPS: 0.4000 mg | ORAL_CAPSULE | Freq: Every day | ORAL | 1 refills | Status: AC

## 2022-07-19 MED ORDER — INSULIN LISPRO 100 UNIT/ML IJ SOLN: 4.0000 [IU] | Freq: Three times a day (TID) | INTRAMUSCULAR | 1 refills | Status: AC

## 2022-07-19 MED ORDER — FOLIC ACID 1 MG PO TABS: 1.0000 mg | ORAL_TABLET | Freq: Every day | ORAL | 1 refills | Status: AC

## 2022-07-19 MED ORDER — FARXIGA 10 MG PO TABS: 10.0000 mg | ORAL_TABLET | Freq: Every day | ORAL | 1 refills | Status: AC

## 2022-07-19 MED ORDER — INSULIN GLARGINE-YFGN 100 UNIT/ML ~~LOC~~ SOLN: 12.0000 [IU] | Freq: Every day | SUBCUTANEOUS | Status: DC

## 2022-07-19 MED ORDER — EZETIMIBE 10 MG PO TABS: 10.0000 mg | ORAL_TABLET | Freq: Every day | ORAL | 1 refills | Status: AC

## 2022-07-19 MED ORDER — ASPIRIN 81 MG PO TBEC: 81.0000 mg | DELAYED_RELEASE_TABLET | Freq: Every day | ORAL | 1 refills | Status: AC

## 2022-07-19 MED ORDER — INSULIN GLARGINE 100 UNIT/ML SOLOSTAR PEN: 12.0000 [IU] | PEN_INJECTOR | Freq: Every day | SUBCUTANEOUS | 1 refills | Status: DC

## 2022-07-19 MED ORDER — LANCET DEVICE MISC
1.0000 | Freq: Three times a day (TID) | 0 refills | Status: AC
Start: 2022-07-19 — End: ?

## 2022-07-19 MED ORDER — BLOOD GLUCOSE TEST VI STRP: 1.0000 | ORAL_STRIP | Freq: Three times a day (TID) | 0 refills | Status: DC

## 2022-07-19 MED ORDER — GABAPENTIN 300 MG PO CAPS: 300.0000 mg | ORAL_CAPSULE | Freq: Two times a day (BID) | ORAL | 1 refills | Status: DC

## 2022-07-19 MED ORDER — CLOPIDOGREL BISULFATE 75 MG PO TABS: 75.0000 mg | ORAL_TABLET | Freq: Every day | ORAL | 1 refills | Status: AC

## 2022-07-19 MED ORDER — METFORMIN HCL 500 MG PO TABS: 500.0000 mg | ORAL_TABLET | Freq: Two times a day (BID) | ORAL | 0 refills | Status: AC

## 2022-07-19 MED ORDER — INSULIN ASPART 100 UNIT/ML IJ SOLN
3.0000 [IU] | Freq: Three times a day (TID) | INTRAMUSCULAR | Status: DC
  Administered 2022-07-19 (×2): 3 [IU] via SUBCUTANEOUS

## 2022-07-19 MED ORDER — SENNOSIDES-DOCUSATE SODIUM 8.6-50 MG PO TABS
1.0000 | ORAL_TABLET | Freq: Once | ORAL | Status: AC
  Administered 2022-07-19: 1 via ORAL
  Filled 2022-07-19: qty 1

## 2022-07-19 MED ORDER — BLOOD GLUCOSE MONITORING SUPPL DEVI: 1.0000 | Freq: Three times a day (TID) | 0 refills | Status: DC

## 2022-07-19 MED ORDER — TRAZODONE HCL 50 MG PO TABS: 50.0000 mg | ORAL_TABLET | Freq: Every day | ORAL | 1 refills | Status: DC

## 2022-07-19 NOTE — Progress Notes (Signed)
Physical Therapy Treatment Patient Details Name: William Acevedo MRN: [id number] DOB: 08/19/1955 Today's Date: 07/19/2022   History of Present Illness 67 yo male presents to therapy s/p hospital admission on 07/16/2022 due to c/o blurred vision,slurred speech, abdominal and back pain, N and V with episode of hyperglycemia with blood glucose reportedly in 500s in home setting. Pt had recent admission to hospital on 06/26/2022 for similar S and S. Pt presented to ED with blood glucose of 414. MRI of brain and no acute findings. CTA chest and abdomen revealed abn findings including pulmonary nodules, acute renal infarct, diverticulosis, nephrolithiasis, aortic atherosclerosis and emphysema. Pt PMH includes but is not limited to: CTS, substance abuse, DM II, neuropathy, PTSD, CVA, CAD, NSTEMI, endocarditis, HTN, HDL and schizoaffective disorder.    PT Comments   Pt admitted with above diagnosis.  Pt currently with functional limitations due to the deficits listed below (see PT Problem List). Pt in bed when PT arrived. Pt had completed lunch and agreeable to getting OOB. Nursing staff stated able to assist pt to bathroom with RW and noted gait instability. Pt c/o dizziness at time of eval and PT assessed for orthostatic hypotension, please see below. Pt able to transition to long sit and scoot B LE to EOB, pt reported testicular pain and unable to scoot to EOB requiring mod A and use of bed pad. Pt required min guard for STS from EOB with cues for proper UE placement. Noted posterior LOB with initial standing balance with min A to recover. Pt reported dizziness in standing. Pt able to progress with gait tolerance to 60 feet with min guard, cues and RW. Pt left seated in bathroom with OT. PT made nurse aware of Bp findings and pt reports of pain.  Pt will benefit from acute skilled PT to increase their independence and safety with mobility to allow discharge.   Bp at rest semi reclined 111/70 (92 PR) Sitting EOB  100/63 (99 PR) Initial standing 92/65 (101 PR) S/p 3 min standing 111/68 (102 PR)    Assistance Recommended at Discharge Intermittent Supervision/Assistance  If plan is discharge home, recommend the following:  Can travel by private vehicle    A little help with walking and/or transfers;A little help with bathing/dressing/bathroom;Assistance with cooking/housework;Direct supervision/assist for medications management;Direct supervision/assist for financial management;Assist for transportation;Help with stairs or ramp for entrance      Equipment Recommendations  Rolling walker (2 wheels)    Recommendations for Other Services       Precautions / Restrictions Precautions Precautions: Fall Restrictions Weight Bearing Restrictions: No     Mobility  Bed Mobility Overal bed mobility: Needs Assistance Bed Mobility: Supine to Sit     Supine to sit: Mod assist, HOB elevated     General bed mobility comments: pt able to perform long sit and transition B LEs toward EOB, during the course of scooting to EOB pt reported testicle pain and indicated he could not do it, pt encouraged to use of bed rails to pull and 1 UE to push to lift hips to scoot EOB and pt unable to complete task, pt required mod A to scoot with use of bed pad to EOB    Transfers Overall transfer level: Needs assistance Equipment used: Rolling walker (2 wheels) Transfers: Sit to/from Stand Sit to Stand: From elevated surface, Min guard (min A from elevated EOB, mod A from recliner)           General transfer comment: pt is able to  follow commands for pushing with 1 UE support from elevated EOB to RW, noted posterior LOB with min A and cues    Ambulation/Gait Ambulation/Gait assistance: Min guard Gait Distance (Feet): 60 Feet Assistive device: Rolling walker (2 wheels) Gait Pattern/deviations: Step-to pattern, Shuffle Gait velocity: decreased     General Gait Details: reports of mild dizziness with gait  tasks, cues for posture and safe use of RW   Stairs             Wheelchair Mobility     Tilt Bed    Modified Rankin (Stroke Patients Only)       Balance Overall balance assessment: Needs assistance (unclear of fall hx) Sitting-balance support: Feet supported, Bilateral upper extremity supported Sitting balance-Leahy Scale: Fair   Postural control: Posterior lean Standing balance support: Bilateral upper extremity supported, During functional activity, Reliant on assistive device for balance Standing balance-Leahy Scale: Poor                              Cognition Arousal/Alertness: Awake/alert Behavior During Therapy: WFL for tasks assessed/performed Overall Cognitive Status: Within Functional Limits for tasks assessed                                          Exercises      General Comments        Pertinent Vitals/Pain Pain Assessment Pain Assessment: Faces Faces Pain Scale: Hurts even more Breathing: normal Negative Vocalization: none Facial Expression: smiling or inexpressive Body Language: relaxed Consolability: no need to console PAINAD Score: 0 Pain Location: testicles with scooting to EOB Pain Descriptors / Indicators: Discomfort Pain Intervention(s): Repositioned, Monitored during session    Home Living                          Prior Function            PT Goals (current goals can now be found in the care plan section) Acute Rehab PT Goals Patient Stated Goal: to go home PT Goal Formulation: With patient Time For Goal Achievement: 08/01/22 Potential to Achieve Goals: Good Progress towards PT goals: Progressing toward goals    Frequency    Min 1X/week      PT Plan Current plan remains appropriate    Co-evaluation              AM-PAC PT "6 Clicks" Mobility   Outcome Measure  Help needed turning from your back to your side while in a flat bed without using bedrails?: A  Little Help needed moving from lying on your back to sitting on the side of a flat bed without using bedrails?: A Lot Help needed moving to and from a bed to a chair (including a wheelchair)?: A Lot Help needed standing up from a chair using your arms (e.g., wheelchair or bedside chair)?: A Lot Help needed to walk in hospital room?: A Little Help needed climbing 3-5 steps with a railing? : Total 6 Click Score: 13    End of Session Equipment Utilized During Treatment: Gait belt Activity Tolerance: Patient limited by fatigue Patient left: Other (comment) (pt left seated on commode with OT) Nurse Communication: Mobility status;Other (comment) (pain location) PT Visit Diagnosis: Unsteadiness on feet (R26.81);Other abnormalities of gait and mobility (R26.89);Muscle weakness (generalized) (M62.81);Other symptoms and signs  involving the nervous system (R29.898)     Time: 4098-1191 PT Time Calculation (min) (ACUTE ONLY): 19 min  Charges:    $Gait Training: 8-22 mins PT General Charges $$ ACUTE PT VISIT: 1 Visit                     Johnny Bridge, PT Acute Rehab    William Acevedo 07/19/2022, 1:06 PM

## 2022-07-19 NOTE — Evaluation (Signed)
Occupational Therapy Evaluation Patient Details Name: William Acevedo MRN: [id number] DOB: 11-24-1955 Today's Date: 07/19/2022   History of Present Illness 67 yo male presents to therapy s/p hospital admission on 07/16/2022 due to c/o blurred vision,slurred speech, abdominal and back pain, N and V with episode of hyperglycemia with blood glucose reportedly in 500s in home setting. Pt had recent admission to hospital on 06/26/2022 for similar S and S. Pt presented to ED with blood glucose of 414. MRI of brain and no acute findings. CTA chest and abdomen revealed abn findings including pulmonary nodules, acute renal infarct, diverticulosis, nephrolithiasis, aortic atherosclerosis and emphysema. Pt PMH includes but is not limited to: CTS, substance abuse, DM II, neuropathy, PTSD, CVA, CAD, NSTEMI, endocarditis, HTN, HDL and schizoaffective disorder.   Clinical Impression   Pt presents with decline in function and safety with ADLs and ADL mobility with impaired strength, balance and endurance; pt with hx of cognitive impairments. PTA pt lived with his brother and pt reports that his brother "helps with everything". Pt has a shower seat at home. Pt currently requires min A with UB and LB ADLs, min guard A with toilet and shower transfers, min A with shaving seated at sink. Pt able to apply shave cream with min A. Pt would benefit from acute OT services to address impairments to maximize level of function and safety      Recommendations for follow up therapy are one component of a multi-disciplinary discharge planning process, led by the attending physician.  Recommendations may be updated based on patient status, additional functional criteria and insurance authorization.   Assistance Recommended at Discharge Intermittent Supervision/Assistance  Patient can return home with the following A little help with walking and/or transfers;A little help with bathing/dressing/bathroom;Direct supervision/assist for  medications management;Assist for transportation;Direct supervision/assist for financial management    Functional Status Assessment  Patient has had a recent decline in their functional status and demonstrates the ability to make significant improvements in function in a reasonable and predictable amount of time.  Equipment Recommendations  None recommended by OT    Recommendations for Other Services       Precautions / Restrictions Precautions Precautions: Fall Restrictions Weight Bearing Restrictions: No      Mobility Bed Mobility               General bed mobility comments: pt up walking with PT in bathroom upon arrival    Transfers Overall transfer level: Needs assistance Equipment used: Rolling walker (2 wheels) Transfers: Sit to/from Stand Sit to Stand: From elevated surface, Min guard           General transfer comment: pt is able to follow commands for pushing with 1 UE support from elevated EOB to RW, noted posterior LOB with min A and cues      Balance Overall balance assessment: Needs assistance Sitting-balance support: Feet supported, Bilateral upper extremity supported Sitting balance-Leahy Scale: Fair   Postural control: Posterior lean Standing balance support: Bilateral upper extremity supported, During functional activity, Reliant on assistive device for balance Standing balance-Leahy Scale: Poor                             ADL either performed or assessed with clinical judgement   ADL Overall ADL's : Needs assistance/impaired Eating/Feeding: Set up;Independent;Sitting   Grooming: Wash/dry hands;Wash/dry face;Oral care;Set up;Supervision/safety;Sitting   Upper Body Bathing: Minimal assistance   Lower Body Bathing: Minimal assistance   Upper  Body Dressing : Minimal assistance   Lower Body Dressing: Minimal assistance   Toilet Transfer: Min guard;Ambulation;Rolling walker (2 wheels);Regular Toilet;Grab bars   Toileting-  Clothing Manipulation and Hygiene: Sit to/from stand;Minimal assistance       Functional mobility during ADLs: Min guard;Rolling walker (2 wheels);Cueing for safety       Vision Ability to See in Adequate Light: 0 Adequate Patient Visual Report: No change from baseline       Perception     Praxis      Pertinent Vitals/Pain Pain Assessment Pain Assessment: Faces Faces Pain Scale: Hurts even more Pain Location: testicles Pain Descriptors / Indicators: Discomfort Pain Intervention(s): Monitored during session, Repositioned     Hand Dominance Right   Extremity/Trunk Assessment Upper Extremity Assessment Upper Extremity Assessment: Generalized weakness   Lower Extremity Assessment Lower Extremity Assessment: Defer to PT evaluation   Cervical / Trunk Assessment Cervical / Trunk Assessment: Normal   Communication Communication Communication: Expressive difficulties   Cognition Arousal/Alertness: Awake/alert Behavior During Therapy: WFL for tasks assessed/performed Overall Cognitive Status: Within Functional Limits for tasks assessed                                       General Comments       Exercises     Shoulder Instructions      Home Living Family/patient expects to be discharged to:: Private residence Living Arrangements: Other relatives Available Help at Discharge: Family;Available 24 hours/day Type of Home: House Home Access: Ramped entrance     Home Layout: One level     Bathroom Shower/Tub: Chief Strategy Officer: Handicapped height Bathroom Accessibility: Yes   Home Equipment: BSC/3in1;Shower seat;Cane - single point;Crutches;Wheelchair - manual   Additional Comments: pt is a poor historian, pt report living with his brother whom provides some assist for ADLs and functional mobility tasks      Prior Functioning/Environment Prior Level of Function : Needs assist  Cognitive Assist : ADLs (cognitive)   ADLs  (Cognitive): Intermittent cues Physical Assist : Mobility (physical);ADLs (physical) Mobility (physical): Transfers;Gait ADLs (physical): Bathing;Dressing;IADLs;Grooming Mobility Comments: pt reports getting up OOB and amb with SPC to bathroom ADLs Comments: Pt reports that his brother helps him "with everything", shaving as well        OT Problem List: Decreased strength;Impaired balance (sitting and/or standing);Decreased cognition;Decreased activity tolerance;Decreased coordination;Decreased knowledge of use of DME or AE      OT Treatment/Interventions: Self-care/ADL training;DME and/or AE instruction;Therapeutic activities;Therapeutic exercise;Patient/family education    OT Goals(Current goals can be found in the care plan section) Acute Rehab OT Goals Patient Stated Goal: go home OT Goal Formulation: With patient Time For Goal Achievement: 08/02/22 Potential to Achieve Goals: Good ADL Goals Pt Will Perform Grooming: with supervision;with set-up;standing Pt Will Perform Upper Body Bathing: with set-up;sitting Pt Will Perform Lower Body Bathing: with min guard assist Pt Will Perform Upper Body Dressing: with set-up Pt Will Perform Lower Body Dressing: with min guard assist Pt Will Transfer to Toilet: with supervision;ambulating;regular height toilet;grab bars Pt Will Perform Toileting - Clothing Manipulation and hygiene: with min guard assist;with supervision;sit to/from stand  OT Frequency: Min 2X/week    Co-evaluation              AM-PAC OT "6 Clicks" Daily Activity     Outcome Measure Help from another person eating meals?: None Help from another person taking care  of personal grooming?: A Little Help from another person toileting, which includes using toliet, bedpan, or urinal?: A Little Help from another person bathing (including washing, rinsing, drying)?: A Little Help from another person to put on and taking off regular upper body clothing?: A Little Help from  another person to put on and taking off regular lower body clothing?: A Little 6 Click Score: 19   End of Session Equipment Utilized During Treatment: Gait belt;Rolling walker (2 wheels)  Activity Tolerance: Patient tolerated treatment well Patient left: in bed;with call bell/phone within reach;with bed alarm set  OT Visit Diagnosis: Unsteadiness on feet (R26.81);Other abnormalities of gait and mobility (R26.89);Muscle weakness (generalized) (M62.81)                Time: 1610-9604 OT Time Calculation (min): 28 min Charges:  OT General Charges $OT Visit: 1 Visit OT Treatments $Self Care/Home Management : 8-22 mins $Therapeutic Activity: 8-22 mins    Galen Manila 07/19/2022, 2:00 PM

## 2022-07-19 NOTE — Progress Notes (Signed)
Called and spoke to brother Ikeem Schoenig. He states that he will be here in 45 minutes to pick up patient for discharge.

## 2022-07-19 NOTE — Inpatient Diabetes Management (Signed)
Inpatient Diabetes Program Recommendations  AACE/ADA: New Consensus Statement on Inpatient Glycemic Control (2015)  Target Ranges:  Prepandial:   less than 140 mg/dL      Peak postprandial:   less than 180 mg/dL (1-2 hours)      Critically ill patients:  140 - 180 mg/dL   Lab Results  Component Value Date   GLUCAP 243 (H) 07/19/2022   HGBA1C 10.6 (H) 01/23/2022    Review of Glycemic Control  Diabetes history: DM2 Outpatient Diabetes medications: Farxiga 10 every day, glipizide 10 BID, Humalog 15 TID, Lantus 5 every day (not taking), metformin 1000 mg BID (not taking) Current orders for Inpatient glycemic control: Semglee 10 every day, Novolog 0-15 TID + 3 units TID, Farxiga 10 QD  Inpatient Diabetes Program Recommendations:    Consider increasing Semglee to 12 units every day  If post-prandials > 180 mg/dL, increase meal coverage to 5 units TID.  Consider adding Novolog 0-5 HS correction  Continue to follow.  Thank you. Ailene Ards, RD, LDN, CDCES Inpatient Diabetes Coordinator [phone]

## 2022-07-19 NOTE — Progress Notes (Signed)
Patient reports he lives with his brother who cares for all his needs and provides transportation

## 2022-07-19 NOTE — Progress Notes (Signed)
Patient reports he lives with his brother who assist with all his needs, denies housing insecurity.

## 2022-07-19 NOTE — Discharge Summary (Signed)
Physician Discharge Summary  LOEL DAGUE ZOX:[id number] DOB: Sep 04, 1955 DOA: 07/16/2022  PCP: Hillery Aldo, NP  Admit date: 07/16/2022 Discharge date: 07/19/2022  Time spent: 40 minutes  Recommendations for Outpatient Follow-up:  Follow outpatient CBC/CMP  Follow with urology outpatient for trial of void Follow with lipid clinic outpatient hyperlipidemia Follow with neurology outpatient, cognitive deficit, ?vascular dementia Holding zyprexa at discharge, defer resumption if needed to PCP Needs repeat CT scan outpatient for pulmonary nodule, see imaging report Adjustments made to insulin, needs PCP follow up to adjust dose further   Discharge Diagnoses:  Principal Problem:   Renal infarction [facility]) Active Problems:   Abdominal pain   Schizoaffective disorder (HCC)   Insulin dependent type 2 diabetes mellitus (HCC)   Discharge Condition: stable  Diet recommendation: heart healthy, diabetic  Filed Weights   07/16/22 1208 07/17/22 0016  Weight: 59 kg 52.3 kg    History of present illness:   LASHAD PALLANES is Pellegrino Kennard 67 y.o. male with medical history significant of schizoaffective disorder, polysubstance abuse, endocarditis, hypertension, hyperlipidemia, insulin-dependent type 2 diabetes, CVA, CAD status post non-STEMI presented to the ED due to nausea, vomiting, abdominal pain, back pain, and slurred speech.  He started feeling poorly and was noted to have hyperglycemia and EMS was called.  He presented to the ED on 6/9 with with confusion and hyperglycemia.    He was found to have Kym Scannell right renal infarct.  Started on aspirin, plavix, lipitor.  Hospitalization complicated by severe lethargy (medication related?).  He's gradually improved and now seems close to baseline.  See below for additional details  Hospital Course:  Assessment and Plan:  Acute Metabolic Encephalopathy  Cognitive Deficit   He was significantly lethargic 6/30 -> unclear etiology, related to zyprexa,  melatonin, trazodone last night?  Received dose of narcan, not particularly clear to me if he awoke in response to this or the blood draw he had around the same time.  Improved at this time per my discussion with RN.  Unclear why he had this episode with these meds that seem to be chronic? He's currently improved, suspect he has chronic cognitive deficit with hx strokes (?vascular dementia), has expressive aphasia that makes communication difficult TSH wnl, BG appropriate, VBG without hypercarbia, ammonia wnl Delirium precautions Will hold zyprexa at discharge, follow with PCP outpatient to discuss further, reduce dose of trazodone   Nausea  Vomiting  Abdominal and Back Pain Suspect this is related to the acute renal infarct of the inferior pole of the right kidney in addition to urinary retention improved   Right Renal Infarct Peripheral Artery Disease Dyslipidemia Appreciate vascular assistance, they suspect due to progressive atherosclerotic vascular disease causing occlusion of accessory renal to R kidney supplying the lower third ---recommended echo and monitoring for arrhythmia.  Recommending aspirin, plavix.  Echo as below Continue tele monitoring He needs lipid clinic follow up outpatient -> LDL 270.  Atorva 80.  Add zetia.   Urinary Retention BPH vs diabetic related UA not particularly concerning for UTI, but in setting of urinary retention follow urine culture (multiple species) Flomax  Needs trial of void, urology follow up    IDDM2, uncontrolled with hyperglycemia BG 391 at presentation Will discharge with basal bolus regimen (12 lantus, 4 units lispro with meals), metformin, jardiance - follow with PCP outpatient   CAD/CVA Continue aspirin, Plavix, Lipitor   Several subpleural solid pulmonary nodules are noted in right middle lobe,  Non-contrast chest CT at 3-6 months is recommended. If  the nodules are stable at time of repeat CT, then future CT at 18-24 months (from  today's scan) is considered optional for low-risk patients, but is recommended for high-risk patients. Per chart review he does have history of pulmonary cryptococcus in the past         Procedures:  Echo IMPRESSIONS     1. Left ventricular ejection fraction, by estimation, is 70 to 75%. The  left ventricle has hyperdynamic function. The left ventricle has no  regional wall motion abnormalities. Left ventricular diastolic parameters  are consistent with Grade I diastolic  dysfunction (impaired relaxation). Mid-cavitary gradient of around 30 mm  Hg.   2. Right ventricular systolic function is normal. The right ventricular  size is normal. Tricuspid regurgitation signal is inadequate for assessing  PA pressure.   3. The mitral valve was not well visualized. No evidence of mitral valve  regurgitation. No evidence of mitral stenosis.   4. The aortic valve was not well visualized. Aortic valve regurgitation  is not visualized. No aortic stenosis is present.   5. The inferior vena cava is normal in size with greater than 50%  respiratory variability, suggesting right atrial pressure of 3 mmHg.   Comparison(s): No significant change from prior study.   Consultations: vascular  Discharge Exam: Vitals:   07/19/22 0419 07/19/22 1302  BP: 129/69 122/70  Pulse: 80 89  Resp: 16 15  Temp: 98.3 F (36.8 C) 98.5 F (36.9 C)  SpO2: 98% 96%   C/o abdominal pain Discussed discharge plan with brother (pt c/o abdominal pain, but when seen, looks comfortable, eating)  General: No acute distress.  Sitting up eating, appears comfortable, though he c/o pain. Cardiovascular: RRR Lungs: unlabored Abdomen: S/NT/ND Neurological: expressive aphasia, difficult to understand, moving all extremities Extremities: No clubbing or cyanosis. No edema.  Discharge Instructions   Discharge Instructions     AMB Referral to Advanced Lipid Disorders Clinic   Complete by: As directed    Internal  Lipid Clinic Referral Scheduling  Internal lipid clinic referrals are providers within [facility], who wish to refer established patients for routine management (help in starting PCSK9 inhibitor therapy) or advanced therapies.  Internal MD referral criteria:              1. All patients with LDL>190 mg/dL  2. All patients with Triglycerides >500 mg/dL  3. Patients with suspected or confirmed heterozygous familial hyperlipidemia (HeFH) or homozygous familial hyperlipidemia (HoFH)  4. Patients with family history of suspicious for genetic dyslipidemia desiring genetic testing  5. Patients refractory to standard guideline based therapy  6. Patients with statin intolerance (failed 2 statins, one of which must be Nialah Saravia high potency statin)  7. Patients who the provider desires to be seen by MD   Internal PharmD referral criteria:   1. Follow-up patients for medication management  2. Follow-up for compliance monitoring  3. Patients for drug education  4. Patients with statin intolerance  5. PCSK9 inhibitor education and prior authorization approvals  6. Patients with triglycerides <500 mg/dL  External Lipid Clinic Referral  External lipid clinic referrals are for providers outside of [facility], considered new clinic patients - automatically routed to MD schedule   Ambulatory referral to Neurology   Complete by: As directed    An appointment is requested in approximately: 4 weeks   Call MD for:  difficulty breathing, headache or visual disturbances   Complete by: As directed    Call MD for:  extreme fatigue  Complete by: As directed    Call MD for:  hives   Complete by: As directed    Call MD for:  persistant dizziness or light-headedness   Complete by: As directed    Call MD for:  persistant nausea and vomiting   Complete by: As directed    Call MD for:  redness, tenderness, or signs of infection (pain, swelling, redness, odor or green/yellow discharge around incision site)   Complete  by: As directed    Call MD for:  severe uncontrolled pain   Complete by: As directed    Call MD for:  temperature >100.4   Complete by: As directed    Diet - low sodium heart healthy   Complete by: As directed    Discharge instructions   Complete by: As directed    You were seen for abdominal pain.  You had Rilyn Upshaw CT scan with imaging findings concerning for Dalia Jollie renal infarct (dead kidney tissue due to Delores Thelen blockage in blood flow).  We discussed this with the vascular surgeons who suspect this is due to peripheral artery disease (atherosclerosis, plaques).  We recommend aspirin and plavix.  You should also be taking your statin (atorvastatin).  Your LDL (bad cholesterol) is very high so we'll add zetia.  You should also follow up with the Lipid (cholesterol) Clinic as an outpatient (I'll place Johnathan Tortorelli referral).  You had an episode of severe lethargy (near unresponsiveness) while you were here.  You've improved.  I'm stopping your zyprexa and reducing your dose of trazodone for now.  Please follow up with your outpatient doctor to discuss these chronic medicines further.  You have cognitive deficits and expressive aphasia.  I suspect this is related to your history of strokes.  We'll refer you to neurology outpatient.  You had Davelyn Gwinn foley catheter placed for urinary retention.  You need to [facility] with [facility] for Terez Freimark follow up appointment to allow the bladder to rest and heal.  They'll take out the foley catheter at the follow up appointment.  We'll send you with flomax.  You had abnormal imaging findings that need follow up outpatient.  You'll need to repeat Reyah Streeter CT scan outpatient with your PCP for pulmonary nodules.   I've changed your diabetes regimen.  We'll send you home on long and short acting insulin.  Take 12 units long acting insulin (lantus) daily and 4 units of short acting (lispro) with meals.  Keep track of your blood sugars so your PCP can adjust this in follow up.  Return for new,  recurrent, or worsening symptoms.  Please ask your PCP to request records from this hospitalization so they know what was done and what the next steps will be.   Increase activity slowly   Complete by: As directed       Allergies as of 07/19/2022       Reactions   Lisinopril Swelling, Other (See Comments)   Angioedema and the patient stated it made his lips swell Pt reports not allergic 06/07/20   Omeprazole Swelling, Other (See Comments)   ANGIOEDEMA OF LIPS, Angioedema of tongue Pt reports not allergic 06/07/20        Medication List     STOP taking these medications    glipiZIDE 10 MG tablet Commonly known as: GLUCOTROL   l-methylfolate-B6-B12 3-35-2 MG Tabs tablet Commonly known as: METANX   magnesium gluconate 500 MG tablet Commonly known as: MAGONATE   OLANZapine 5 MG tablet Commonly known as: ZYPREXA  TAKE these medications    acetaminophen 325 MG tablet Commonly known as: TYLENOL Take 1-2 tablets (325-650 mg total) by mouth every 4 (four) hours as needed for mild pain.   aspirin EC 81 MG tablet Take 1 tablet (81 mg total) by mouth daily. Swallow whole.   atorvastatin 80 MG tablet Commonly known as: LIPITOR Take 1 tablet (80 mg total) by mouth daily.   blood glucose meter kit and supplies Kit Dispense based on patient and insurance preference. Use up to four times daily as directed. (FOR ICD-9 250.00, 250.01).   Blood Glucose Monitoring Suppl Devi 1 each by Does not apply route 3 (three) times daily. May dispense any manufacturer covered by patient's insurance.   BLOOD GLUCOSE TEST STRIPS Strp 1 each by Does not apply route 3 (three) times daily. Use as directed to check blood sugar. May dispense any manufacturer covered by patient's insurance and fits patient's device.   clopidogrel 75 MG tablet Commonly known as: PLAVIX Take 1 tablet (75 mg total) by mouth daily.   DULoxetine 60 MG capsule Commonly known as: CYMBALTA Take 1 capsule (60 mg  total) by mouth daily. For chronic pain and mood   ezetimibe 10 MG tablet Commonly known as: ZETIA Take 1 tablet (10 mg total) by mouth daily. Start taking on: July 20, 2022   famotidine 20 MG tablet Commonly known as: PEPCID Take 1 tablet (20 mg total) by mouth daily before breakfast.   Farxiga 10 MG Tabs tablet Generic drug: dapagliflozin propanediol Take 1 tablet (10 mg total) by mouth daily.   folic acid 1 MG tablet Commonly known as: FOLVITE Take 1 tablet (1 mg total) by mouth at bedtime.   gabapentin 300 MG capsule Commonly known as: NEURONTIN Take 1 capsule (300 mg total) by mouth 2 (two) times daily.   insulin glargine 100 UNIT/ML Solostar Pen Commonly known as: LANTUS Inject 12 Units into the skin daily. Please follow up with your PCP for adjustments to your insulin regimen What changed:  how much to take additional instructions   insulin lispro 100 UNIT/ML injection Commonly known as: HUMALOG Inject 0.04 mLs (4 Units total) into the skin 3 (three) times daily with meals. Please follow up with your PCP for adjustments of your insulin regimen What changed:  how much to take when to take this additional instructions   Lancet Device Misc 1 each by Does not apply route 3 (three) times daily. May dispense any manufacturer covered by patient's insurance.   melatonin 3 MG Tabs tablet Take 2 tablets (6 mg total) by mouth at bedtime as needed (for sleep).   metFORMIN 500 MG tablet Commonly known as: GLUCOPHAGE Take 1 tablet (500 mg total) by mouth 2 (two) times daily with Yareth Macdonnell meal. Follow up with your PCP to discuss uptitration of this medicine What changed:  medication strength how much to take additional instructions   metoprolol succinate 50 MG 24 hr tablet Commonly known as: TOPROL-XL Take 1 tablet (50 mg total) by mouth daily. Take with or immediately following Zailyn Rowser meal.   multivitamin with minerals Tabs tablet Take 1 tablet by mouth daily.   Pen Needles 31G  X 5 MM Misc 1 Application by Does not apply route daily at 6 (six) AM.   senna-docusate 8.6-50 MG tablet Commonly known as: Senokot-S Take 1 tablet by mouth at bedtime as needed for mild constipation.   tamsulosin 0.4 MG Caps capsule Commonly known as: Flomax Take 1 capsule (0.4 mg total) by mouth  daily after supper.   traZODone 50 MG tablet Commonly known as: DESYREL Take 1 tablet (50 mg total) by mouth at bedtime. What changed:  medication strength how much to take       Allergies  Allergen Reactions   Lisinopril Swelling and Other (See Comments)    Angioedema and the patient stated it made his lips swell  Pt reports not allergic 06/07/20    Omeprazole Swelling and Other (See Comments)    ANGIOEDEMA OF LIPS, Angioedema of tongue Pt reports not allergic 06/07/20    Follow-up Information     [facility] Follow up.   Why: [facility] [address], [place], [place] [address] Phone: [phone] Home Health Physical Therapy/Occupational Therapy/Nursing/Home Health Aide        Hollice Espy Margarita Sermons, NP Follow up.   Specialty: Nurse Practitioner Why: July 15th at 8:15 AM (be there by 8 AM) for your trial of void Contact information: [address] [place] [address] [phone]         Hillery Aldo, NP Follow up.   Why: call for Joleene Burnham follow up appointment Contact information: [address] [place] [address] [phone]                  The results of significant diagnostics from this hospitalization (including imaging, microbiology, ancillary and laboratory) are listed below for reference.    Significant Diagnostic Studies: ECHOCARDIOGRAM COMPLETE  Result Date: 07/17/2022    ECHOCARDIOGRAM REPORT   Patient Name:   ETZIO SINEX Date of Exam: 07/17/2022 Medical Rec #:  [id number]           Height:       66.0 in Accession #:    [id number]          Weight:       115.3 lb Date of Birth:  07-18-1955            BSA:          1.582 m Patient Age:    67 years            BP:           150/79 mmHg Patient Gender: M                   HR:           84 bpm. Exam Location:  Inpatient Procedure: 2D Echo, Cardiac Doppler, Color Doppler and Intracardiac            Opacification Agent Indications:    Abnormal ECG  History:        Patient has prior history of Echocardiogram examinations, most                 recent 02/18/2022. Previous Myocardial Infarction and CAD, Stroke,                 Endocarditis; Risk Factors:Hypertension, Dyslipidemia, Diabetes                 and Current Smoker. Polysubstance abuse, ETOH.  Sonographer:    Milda Smart Referring Phys: [id number] OVFI XU  Sonographer Comments: Suboptimal parasternal window and suboptimal apical window. Image acquisition challenging due to patient body habitus, Image acquisition challenging due to respiratory motion and Image acquisition challenging due to uncooperative patient. IMPRESSIONS  1. Left ventricular ejection fraction, by estimation, is 70 to 75%. The left ventricle has hyperdynamic function. The left ventricle has no  regional wall motion abnormalities. Left ventricular diastolic parameters are consistent with Grade I diastolic dysfunction (impaired relaxation). Mid-cavitary gradient of around 30 mm Hg.  2. Right ventricular systolic function is normal. The right ventricular size is normal. Tricuspid regurgitation signal is inadequate for assessing PA pressure.  3. The mitral valve was not well visualized. No evidence of mitral valve regurgitation. No evidence of mitral stenosis.  4. The aortic valve was not well visualized. Aortic valve regurgitation is not visualized. No aortic stenosis is present.  5. The inferior vena cava is normal in size with greater than 50% respiratory variability, suggesting right atrial pressure of 3 mmHg. Comparison(s): No significant change from prior study. FINDINGS  Left Ventricle: Left ventricular ejection fraction, by estimation,  is 70 to 75%. The left ventricle has hyperdynamic function. The left ventricle has no regional wall motion abnormalities. Definity contrast agent was given IV to delineate the left ventricular endocardial borders. The left ventricular internal cavity size was normal in size. There is no left ventricular hypertrophy. Left ventricular diastolic parameters are consistent with Grade I diastolic dysfunction (impaired relaxation). Right Ventricle: The right ventricular size is normal. No increase in right ventricular wall thickness. Right ventricular systolic function is normal. Tricuspid regurgitation signal is inadequate for assessing PA pressure. Left Atrium: Left atrial size was normal in size. Right Atrium: Right atrial size was normal in size. Pericardium: Trivial pericardial effusion is present. Mitral Valve: The mitral valve was not well visualized. No evidence of mitral valve regurgitation. No evidence of mitral valve stenosis. MV peak gradient, 8.6 mmHg. The mean mitral valve gradient is 4.0 mmHg. Tricuspid Valve: The tricuspid valve is not well visualized. Tricuspid valve regurgitation is not demonstrated. No evidence of tricuspid stenosis. Aortic Valve: The aortic valve was not well visualized. Aortic valve regurgitation is not visualized. No aortic stenosis is present. Pulmonic Valve: The pulmonic valve was not well visualized. Pulmonic valve regurgitation is not visualized. No evidence of pulmonic stenosis. Aorta: The aortic root is normal in size and structure. Venous: The inferior vena cava is normal in size with greater than 50% respiratory variability, suggesting right atrial pressure of 3 mmHg. IAS/Shunts: No atrial level shunt detected by color flow Doppler.  LEFT VENTRICLE PLAX 2D LVIDd:         3.20 cm   Diastology LVIDs:         2.40 cm   LV e' medial:    2.72 cm/s LV PW:         0.80 cm   LV E/e' medial:  34.3 LV IVS:        0.90 cm   LV e' lateral:   3.92 cm/s LVOT diam:     2.00 cm   LV E/e'  lateral: 23.8 LV SV:         55 LV SV Index:   35 LVOT Area:     3.14 cm  RIGHT VENTRICLE             IVC RV S prime:     12.10 cm/s  IVC diam: 0.90 cm TAPSE (M-mode): 2.0 cm LEFT ATRIUM             Index        RIGHT ATRIUM          Index LA diam:        2.70 cm 1.71 cm/m   RA Area:     6.74 cm LA Vol (A2C):   22.5 ml 14.22 ml/m  RA  Volume:   10.50 ml 6.64 ml/m LA Vol (A4C):   12.6 ml 7.96 ml/m LA Biplane Vol: 17.5 ml 11.06 ml/m  AORTIC VALVE LVOT Vmax:   110.00 cm/s LVOT Vmean:  77.400 cm/s LVOT VTI:    0.176 m  AORTA Ao Root diam: 3.30 cm MITRAL VALVE MV Area (PHT): 1.86 cm     SHUNTS MV Area VTI:   1.77 cm     Systemic VTI:  0.18 m MV Peak grad:  8.6 mmHg     Systemic Diam: 2.00 cm MV Mean grad:  4.0 mmHg MV Vmax:       1.47 m/s MV Vmean:      93.1 cm/s MV Decel Time: 408 msec MV E velocity: 93.20 cm/s MV Eddith Mentor velocity: 137.00 cm/s MV E/Vraj Denardo ratio:  0.68 Vishnu Priya Mallipeddi Electronically signed by Winfield Rast Mallipeddi Signature Date/Time: 07/17/2022/1:14:02 PM    Final    MR BRAIN WO CONTRAST  Result Date: 07/16/2022 CLINICAL DATA:  Neuro deficit, acute, stroke suspected. Hyperglycemia, nausea, and vomiting. EXAM: MRI HEAD WITHOUT CONTRAST TECHNIQUE: Multiplanar, multiecho pulse sequences of the brain and surrounding structures were obtained without intravenous contrast. COMPARISON:  Head CT 07/16/2022 and MRI 02/17/2022 FINDINGS: Some sequences are moderately motion degraded. Brain: There is no evidence of an acute infarct, mass, midline shift, or extra-axial fluid collection. Marcella Charlson chronic ACA territory infarct in the left frontal lobe was acute on the prior MRI. Chronic infarcts are again noted in the right parietal and bilateral occipital lobes as well as both cerebellar hemispheres. There is Elmar Antigua background of moderate chronic small vessel ischemia in the cerebral white matter and pons. Wallerian degeneration is noted in the left cerebral peduncle. Scattered chronic cerebral microhemorrhages are  again seen bilaterally. There is mild cerebral atrophy. Vascular: Major intracranial vascular flow voids are preserved. Skull and upper cervical spine: Unremarkable bone marrow signal. Sinuses/Orbits: Unremarkable orbits. Mucosal thickening and/or fluid in the left sphenoid sinus and Alandra Sando posterior left ethmoid air cell. Clear mastoid air cells. Other: None. IMPRESSION: 1. No acute intracranial abnormality. 2. Moderate chronic small vessel ischemia with multiple chronic infarcts as above. Electronically Signed   By: Sebastian Ache M.D.   On: 07/16/2022 18:33   CT Angio Chest/Abd/Pel for Dissection W and/or Wo Contrast  Result Date: 07/16/2022 CLINICAL DATA:  Acute aortic syndrome suspected. Continuous nausea and vomiting. History of diabetes, alcohol and drug abuse, endocarditis, and strokes. EXAM: CT ANGIOGRAPHY CHEST, ABDOMEN AND PELVIS TECHNIQUE: Non-contrast CT of the chest was initially obtained. Multidetector CT imaging through the chest, abdomen and pelvis was performed using the standard protocol during bolus administration of intravenous contrast. Multiplanar reconstructed images and MIPs were obtained and reviewed to evaluate the vascular anatomy. RADIATION DOSE REDUCTION: This exam was performed according to the departmental dose-optimization program which includes automated exposure control, adjustment of the mA and/or kV according to patient size and/or use of iterative reconstruction technique. CONTRAST:  75mL OMNIPAQUE IOHEXOL 350 MG/ML SOLN COMPARISON:  CT abdomen pelvis 02/17/2022, CTA abdomen pelvis 04/04/2021 FINDINGS: CTA CHEST FINDINGS Cardiovascular: Preferential opacification of the thoracic aorta. No evidence of thoracic aortic aneurysm or dissection. Extensive calcific atherosclerosis throughout the thoracic aorta. Multivessel coronary artery calcification. Moderate (approximately 50%) luminal narrowing of the proximal left subclavian artery secondary to hypodense plaque (series 10, image  19). Normal heart size. No pericardial effusion. Mediastinum/Nodes: No enlarged mediastinal, hilar, or axillary lymph nodes. Thyroid gland and trachea demonstrate no significant findings. Circumferential wall thickening of the distal esophagus. Lungs/Pleura: Several  subpleural solid pulmonary nodules are noted in the lateral aspect of the right middle lobe, the largest measuring 0.6 x 0.5 cm (series 6, image 85). These are new compared to prior CT chest on 03/25/2020; of note, this area was not included on the field of view on the multiple interval CT abdomen pelvis exams. Inferior to the nodules is persistent subsegmental atelectasis versus scarring, similar to CT abdomen pelvis 01/23/2022 and improved compared to 04/04/2021. An additional 0.4 x 0.3 cm subpleural solid pulmonary nodule is noted in the right lower lobe (series 6, image 76). Similarly, this is also new compared to prior CT chest on 03/25/2020 but not included in the field of view on the multiple interval abdomen/pelvis exams. Mild upper lobe predominant paraseptal emphysema. Musculoskeletal: No chest wall abnormality. No acute or significant osseous findings. Review of the MIP images confirms the above findings. CTA ABDOMEN AND PELVIS FINDINGS VASCULAR Aorta: Extensive calcific atherosclerosis with mild luminal narrowing immediately superior to the iliac bifurcation. No aneurysm, dissection, vasculitis or significant stenosis. Celiac: Patent without evidence of aneurysm, dissection, or vasculitis. Moderate short-segment luminal narrowing (approximately 50%) at the ostium secondary to calcific atherosclerosis. SMA: Patent without evidence of aneurysm, dissection, vasculitis or significant stenosis. Renals: Right renal artery is patent without evidence of severe stenosis. An inferior accessory right renal artery which supplies the inferior aspect of the right kidney is patent but diminutive in caliber along the proximal half of the vessel (series 10,  image 157-159). Proximally severe short segment luminal narrowing (approximately greater than 75%) at the ostium of the left renal artery secondary to atherosclerosis (series 10, image 161. An accessory left renal artery is noted. IMA: Patent without evidence of aneurysm, dissection, or vasculitis. The ostium of the IMA is not well visualized secondary to calcific atherosclerosis. Inflow: Patent without evidence of aneurysm, dissection, or vasculitis. Short segment moderate (approximately 50%) luminal narrowing at the origins of the bilateral common iliac arteries (series 10, image 198). The external iliac and internal iliac arteries are normal caliber. Veins: No obvious venous abnormality within the limitations of this arterial phase study. Review of the MIP images confirms the above findings. NON-VASCULAR Hepatobiliary: No focal liver abnormality is seen. No gallstones, gallbladder wall thickening, or biliary dilatation. Pancreas: Unremarkable. No pancreatic ductal dilatation or surrounding inflammatory changes. Spleen: Normal in size without focal abnormality. Adrenals/Urinary Tract: Adrenal glands are unremarkable. Two punctate nonobstructive calyceal stones in the upper and middle thirds of the right kidney. Multifocal cortical scarring bilaterally. No hydronephrosis. No suspicious renal lesion. Hypoenhancement of the lower third of the right kidney (series 10, image 166; series 14, image 50). Urinary bladder is markedly distended, nearly to the level of the umbilicus but otherwise unremarkable. Stomach/Bowel: Stomach is within normal limits. Appendix appears normal. Diverticulosis of the descending and sigmoid colon. No evidence of bowel wall thickening, distention, or inflammatory changes. Lymphatic: No enlarged abdominopelvic lymph nodes. Reproductive: Prostatomegaly. Other: No abdominal wall hernia or abnormality. No abdominopelvic ascites. Musculoskeletal: No acute or suspicious osseous findings. Review of  the MIP images confirms the above findings. IMPRESSION: VASCULAR: 1. No evidence of acute aortic syndrome as queried. Severe calcific atherosclerosis of the thoracic and abdominal aorta without aneurysm, dissection or severe stenosis. 2. Diminutive caliber of an accessory right renal artery supplying the lower third of the right kidney, with resulting acute renal infarct of the inferior pole of the right kidney. 3. Severe short segment luminal narrowing (approximately 75%) at the ostium of the left renal artery secondary to  calcific atherosclerosis. 4. Moderate short segment (approximately 50%) luminal narrowing of the proximal bilateral common iliac arteries. 5. Moderate (approximately 50%) luminal narrowing of the proximal left subclavian artery secondary to hypodense plaque. NONVASCULAR: 1. Several subpleural solid pulmonary nodules are noted in right middle lobe, the largest measuring up to 0.6 cm, favored to be inflammatory in etiology given the consolidation previously noted in this area on prior exams. Non-contrast chest CT at 3-6 months is recommended. If the nodules are stable at time of repeat CT, then future CT at 18-24 months (from today's scan) is considered optional for low-risk patients, but is recommended for high-risk patients. This recommendation follows the consensus statement: Guidelines for Management of Incidental Pulmonary Nodules Detected on CT Images: From the Fleischner Society 2017; Radiology 2017; 284:228-243. 2. Hypoenhancement of the lower third of the right kidney, as noted above, consistent with acute renal infarct. 3. Urinary bladder is markedly distended, nearly to the level of the umbilicus but otherwise unremarkable. Correlate for urinary retention. 4. Prostatomegaly. 5. Diverticulosis of the descending and sigmoid colon without findings of diverticulitis. 6. Nonobstructive right nephrolithiasis. 7. Emphysema. Aortic Atherosclerosis (ICD10-I70.0) and Emphysema (ICD10-J43.9).  Electronically Signed   By: Sherron Ales M.D.   On: 07/16/2022 17:04   CT HEAD WO CONTRAST  Result Date: 07/16/2022 CLINICAL DATA:  Continuous nausea and vomiting with elevated blood sugar. History of diabetes. ETOH and drug abuse. Multiple strokes. EXAM: CT HEAD WITHOUT CONTRAST TECHNIQUE: Contiguous axial images were obtained from the base of the skull through the vertex without intravenous contrast. RADIATION DOSE REDUCTION: This exam was performed according to the departmental dose-optimization program which includes automated exposure control, adjustment of the mA and/or kV according to patient size and/or use of iterative reconstruction technique. COMPARISON:  02/17/2022 FINDINGS: Brain: Ventricles, cisterns and other CSF spaces are within normal. Evidence of old bilateral infarcts unchanged. Mild chronic ischemic microvascular disease. No mass, mass effect or shift of midline structures. No evidence of acute hemorrhage. Vascular: No hyperdense vessel or unexpected calcification. Skull: Normal. Negative for fracture or focal lesion. Sinuses/Orbits: Orbits are normal. Minimal opacification over the left ethmoid air cells. Other: None. IMPRESSION: 1. No acute findings. 2. Old bilateral infarcts unchanged. 3. Mild chronic ischemic microvascular disease. Electronically Signed   By: Elberta Fortis M.D.   On: 07/16/2022 16:18    Microbiology: Recent Results (from the past 240 hour(s))  Urine Culture (for pregnant, neutropenic or urologic patients or patients with an indwelling urinary catheter)     Status: Abnormal   Collection Time: 07/16/22  4:45 PM   Specimen: Urine, Clean Catch  Result Value Ref Range Status   Specimen Description   Final    URINE, CLEAN CATCH Performed at [facility], [address], [place], [place] [address]    Special Requests   Final    NONE Performed at [facility], [address], [place], [place] [address]    Culture MULTIPLE  SPECIES PRESENT, SUGGEST RECOLLECTION (Leondra Cullin)  Final   Report Status 07/19/2022 FINAL  Final  SARS Coronavirus 2 by RT PCR (hospital order, performed in [facility] hospital lab) *cepheid single result test* Anterior Nasal Swab     Status: None   Collection Time: 07/16/22  5:40 PM   Specimen: Anterior Nasal Swab  Result Value Ref Range Status   SARS Coronavirus 2 by RT PCR NEGATIVE NEGATIVE Final    Comment: (NOTE) SARS-CoV-2 target nucleic acids are NOT DETECTED.  The SARS-CoV-2 RNA is generally  detectable in upper and lower respiratory specimens during the acute phase of infection. The lowest concentration of SARS-CoV-2 viral copies this assay can detect is 250 copies / mL. Kasy Iannacone negative result does not preclude SARS-CoV-2 infection and should not be used as the sole basis for treatment or other patient management decisions.  Ellianah Cordy negative result may occur with improper specimen collection / handling, submission of specimen other than nasopharyngeal swab, presence of viral mutation(s) within the areas targeted by this assay, and inadequate number of viral copies (<250 copies / mL). Shavone Nevers negative result must be combined with clinical observations, patient history, and epidemiological information.  Fact Sheet for Patients:   RoadLapTop.co.za  Fact Sheet for Healthcare Providers: http://kim-miller.com/  This test is not yet approved or  cleared by the [place] FDA and has been authorized for detection and/or diagnosis of SARS-CoV-2 by FDA under an Emergency Use Authorization (EUA).  This EUA will remain in effect (meaning this test can be used) for the duration of the COVID-19 declaration under Section 564(b)(1) of the Act, 21 U.S.C. section 360bbb-3(b)(1), unless the authorization is terminated or revoked sooner.  Performed at [facility], [address], [place], [place] [address]      Labs: Basic Metabolic  Panel: Recent Labs  Lab 07/16/22 1730 07/17/22 0122 07/17/22 0839 07/18/22 0432 07/19/22 0516  NA 141 139 139 137 134*  K 3.6 4.0 3.4* 3.5 3.7  CL 109 101 105 105 103  CO2 22 28 25 24 24   GLUCOSE 237* 149* 138* 157* 297*  BUN 23 23 23  24* 22  CREATININE 0.87 0.82 0.77 1.00 1.02  CALCIUM 7.2* 8.9 8.1* 8.0* 8.0*  MG  --   --   --  2.2 2.3  PHOS  --   --   --  2.9 4.0   Liver Function Tests: Recent Labs  Lab 07/18/22 0432 07/19/22 0516  AST 23 19  ALT 18 18  ALKPHOS 44 48  BILITOT 0.5 0.4  PROT 5.3* 5.5*  ALBUMIN 2.7* 2.6*   No results for input(s): "LIPASE", "AMYLASE" in the last 168 hours. Recent Labs  Lab 07/17/22 1058  AMMONIA 27   CBC: Recent Labs  Lab 07/16/22 1211 07/17/22 0122 07/18/22 0432 07/19/22 0516  WBC 7.8 7.8 5.6 4.6  NEUTROABS 6.7  --  3.3 2.4  HGB 14.0 12.1* 9.8* 9.7*  HCT 43.4 38.3* 30.9* 30.8*  MCV 86.5 87.0 89.0 88.5  PLT 264 209 174 160   Cardiac Enzymes: No results for input(s): "CKTOTAL", "CKMB", "CKMBINDEX", "TROPONINI" in the last 168 hours. BNP: BNP (last 3 results) No results for input(s): "BNP" in the last 8760 hours.  ProBNP (last 3 results) No results for input(s): "PROBNP" in the last 8760 hours.  CBG: Recent Labs  Lab 07/18/22 1557 07/18/22 2108 07/19/22 0745 07/19/22 1115 07/19/22 1634  GLUCAP 147* 372* 243* 239* 146*       Signed:  Lacretia Nicks MD.  [facility] 07/19/2022, 4:47 PM

## 2022-07-19 NOTE — TOC Progression Note (Addendum)
Transition of Care [facility]) - Progression Note    Patient Details  Name: William Acevedo MRN: [id number] Date of Birth: 30-Oct-1955  Transition of Care [facility]) CM/SW Contact  Howell Rucks, RN Phone Number: 07/19/2022, 3:48 PM  Clinical Narrative:  Met with pt at bedside to review dc planning, PT recommendation for [facility] PT/OT, pt agreeable, no preference. NCM will assist with [facility], request sent out, await acceptance.    -4:30pm  [facility] , rep Marylene Land,  accepted for [facility] PT/OT/RN/HHA, entered in AVS    Expected Discharge Plan: Home w Home Health Services Barriers to Discharge: Continued Medical Work up  Expected Discharge Plan and Services   Discharge Planning Services: CM Consult   Living arrangements for the past 2 months: Single Family Home                                       Social Determinants of Health (SDOH) Interventions SDOH Screenings   Food Insecurity: Patient Unable To Answer (07/17/2022)  Housing: Medium Risk (07/17/2022)  Transportation Needs: Unmet Transportation Needs (07/19/2022)  Utilities: Not At Risk (07/17/2022)  Tobacco Use: High Risk (07/16/2022)    Readmission Risk Interventions    04/19/2020    2:34 PM  Readmission Risk Prevention Plan  Transportation Screening Complete  Medication Review (RN Care Manager) Complete  PCP or Specialist appointment within 3-5 days of discharge Complete  HRI or Home Care Consult Complete  SW Recovery Care/Counseling Consult Complete  Palliative Care Screening Not Applicable  Skilled Nursing Facility Not Applicable

## 2022-07-28 ENCOUNTER — Other Ambulatory Visit: Payer: Self-pay | Admitting: Physical Medicine and Rehabilitation

## 2022-09-29 ENCOUNTER — Encounter: Payer: Self-pay | Admitting: Internal Medicine

## 2022-10-26 ENCOUNTER — Ambulatory Visit: Payer: 59 | Attending: Cardiology | Admitting: Cardiology

## 2022-11-11 ENCOUNTER — Other Ambulatory Visit: Payer: Self-pay | Admitting: Student

## 2022-11-11 DIAGNOSIS — R911 Solitary pulmonary nodule: Secondary | ICD-10-CM

## 2023-01-02 ENCOUNTER — Encounter (HOSPITAL_COMMUNITY): Payer: Self-pay

## 2023-01-02 ENCOUNTER — Emergency Department (HOSPITAL_COMMUNITY)
Admission: EM | Admit: 2023-01-02 | Discharge: 2023-01-03 | Discharge status: Against Medical Advice | Payer: 59 | Attending: Emergency Medicine | Admitting: Emergency Medicine

## 2023-01-02 ENCOUNTER — Emergency Department (HOSPITAL_COMMUNITY): Payer: 59

## 2023-01-02 ENCOUNTER — Other Ambulatory Visit: Payer: Self-pay

## 2023-01-02 DIAGNOSIS — Z5321 Procedure and treatment not carried out due to patient leaving prior to being seen by health care provider: Secondary | ICD-10-CM | POA: Insufficient documentation

## 2023-01-02 DIAGNOSIS — R791 Abnormal coagulation profile: Secondary | ICD-10-CM | POA: Insufficient documentation

## 2023-01-02 DIAGNOSIS — R479 Unspecified speech disturbances: Secondary | ICD-10-CM | POA: Insufficient documentation

## 2023-01-02 DIAGNOSIS — I69328 Other speech and language deficits following cerebral infarction: Secondary | ICD-10-CM | POA: Insufficient documentation

## 2023-01-02 DIAGNOSIS — M791 Myalgia, unspecified site: Secondary | ICD-10-CM | POA: Insufficient documentation

## 2023-01-02 LAB — CBC WITH DIFFERENTIAL/PLATELET
Abs Immature Granulocytes: 0.02 10*3/uL (ref 0.00–0.07)
Basophils Absolute: 0 10*3/uL (ref 0.0–0.1)
Basophils Relative: 0 %
Eosinophils Absolute: 0.2 10*3/uL (ref 0.0–0.5)
Eosinophils Relative: 3 %
HCT: 40.6 % (ref 39.0–52.0)
Hemoglobin: 13 g/dL (ref 13.0–17.0)
Immature Granulocytes: 0 %
Lymphocytes Relative: 22 %
Lymphs Abs: 1.3 10*3/uL (ref 0.7–4.0)
MCH: 27.4 pg (ref 26.0–34.0)
MCHC: 32 g/dL (ref 30.0–36.0)
MCV: 85.5 fL (ref 80.0–100.0)
Monocytes Absolute: 0.6 10*3/uL (ref 0.1–1.0)
Monocytes Relative: 10 %
Neutro Abs: 3.9 10*3/uL (ref 1.7–7.7)
Neutrophils Relative %: 65 %
Platelets: 266 10*3/uL (ref 150–400)
RBC: 4.75 MIL/uL (ref 4.22–5.81)
RDW: 12.5 % (ref 11.5–15.5)
WBC: 6 10*3/uL (ref 4.0–10.5)
nRBC: 0 % (ref 0.0–0.2)

## 2023-01-02 LAB — URINALYSIS, ROUTINE W REFLEX MICROSCOPIC
Bacteria, UA: NONE SEEN
Bilirubin Urine: NEGATIVE
Glucose, UA: >=500 mg/dL — AB
Hgb urine dipstick: NEGATIVE
Ketones, ur: 5 mg/dL — AB
Leukocytes,Ua: NEGATIVE
Nitrite: NEGATIVE
Protein, ur: NEGATIVE mg/dL
Specific Gravity, Urine: 1.026 (ref 1.005–1.030)
pH: 5 (ref 5.0–8.0)

## 2023-01-02 LAB — COMPREHENSIVE METABOLIC PANEL
ALT: 12 U/L (ref 0–44)
AST: 20 U/L (ref 15–41)
Albumin: 3.8 g/dL (ref 3.5–5.0)
Alkaline Phosphatase: 62 U/L (ref 38–126)
Anion gap: 13 (ref 5–15)
BUN: 33 mg/dL — ABNORMAL HIGH (ref 8–23)
CO2: 23 mmol/L (ref 22–32)
Calcium: 9.6 mg/dL (ref 8.9–10.3)
Chloride: 96 mmol/L — ABNORMAL LOW (ref 98–111)
Creatinine, Ser: 1.41 mg/dL — ABNORMAL HIGH (ref 0.61–1.24)
GFR, Estimated: 54 mL/min — ABNORMAL LOW (ref >60)
Glucose, Bld: 495 mg/dL — ABNORMAL HIGH (ref 70–99)
Potassium: 4.5 mmol/L (ref 3.5–5.1)
Sodium: 132 mmol/L — ABNORMAL LOW (ref 135–145)
Total Bilirubin: 0.8 mg/dL (ref <1.2)
Total Protein: 7.4 g/dL (ref 6.5–8.1)

## 2023-01-02 LAB — CBC
HCT: 40.9 % (ref 39.0–52.0)
Hemoglobin: 13.1 g/dL (ref 13.0–17.0)
MCH: 27.3 pg (ref 26.0–34.0)
MCHC: 32 g/dL (ref 30.0–36.0)
MCV: 85.4 fL (ref 80.0–100.0)
Platelets: 273 10*3/uL (ref 150–400)
RBC: 4.79 MIL/uL (ref 4.22–5.81)
RDW: 12.6 % (ref 11.5–15.5)
WBC: 6.1 10*3/uL (ref 4.0–10.5)
nRBC: 0 % (ref 0.0–0.2)

## 2023-01-02 LAB — PROTIME-INR
INR: 1 (ref 0.8–1.2)
Prothrombin Time: 13 s (ref 11.4–15.2)

## 2023-01-02 LAB — RAPID URINE DRUG SCREEN, HOSP PERFORMED
Amphetamines: NOT DETECTED
Barbiturates: NOT DETECTED
Benzodiazepines: NOT DETECTED
Cocaine: NOT DETECTED
Opiates: NOT DETECTED
Tetrahydrocannabinol: NOT DETECTED

## 2023-01-02 LAB — I-STAT CHEM 8, ED
BUN: 33 mg/dL — ABNORMAL HIGH (ref 8–23)
Calcium, Ion: 1.17 mmol/L (ref 1.15–1.40)
Chloride: 99 mmol/L (ref 98–111)
Creatinine, Ser: 1.4 mg/dL — ABNORMAL HIGH (ref 0.61–1.24)
Glucose, Bld: 487 mg/dL — ABNORMAL HIGH (ref 70–99)
HCT: 41 % (ref 39.0–52.0)
Hemoglobin: 13.9 g/dL (ref 13.0–17.0)
Potassium: 4.4 mmol/L (ref 3.5–5.1)
Sodium: 133 mmol/L — ABNORMAL LOW (ref 135–145)
TCO2: 22 mmol/L (ref 22–32)

## 2023-01-02 LAB — ETHANOL: Alcohol, Ethyl (B): <10 mg/dL (ref <10)

## 2023-01-02 LAB — APTT: aPTT: 23 s — ABNORMAL LOW (ref 24–36)

## 2023-01-02 LAB — CBG MONITORING, ED: Glucose-Capillary: 404 mg/dL — ABNORMAL HIGH (ref 70–99)

## 2023-01-02 NOTE — ED Triage Notes (Addendum)
EMS reports they were called out for hyperglycemia. (CBG 116 with EMS). When EMS arrived the patient seemed to be fine. Patient reports pain all over when walking. Family reports that he may have relapsed on cocaine while walking to the store. EMS reports bilateral pupils at a 2. Patient has slurred speech at baseline from a previous stroke.   Patient states that he thinks he had a stroke yesterday. States Yesterday at breakfast he felt normal. States sometime between breakfast and lunch he had a stroke and is experiencing slurred speech. EMS reports slurred speech is his baseline.

## 2023-01-02 NOTE — ED Provider Triage Note (Signed)
Emergency Medicine Provider Triage Evaluation Note  William Acevedo , a 67 y.o. male  was evaluated in triage.  Pt complains of worsening aphasia, hx of same worse that normal.  Review of Systems  Positive:  Negative:   Physical Exam  BP (!) 156/80 (BP Location: Right Arm)   Pulse (!) 101   Temp 98.9 F (37.2 C) (Oral)   Resp 15   SpO2 100%  Gen:   Awake, no distress   Resp:  Normal effort  MSK:   Moves extremities without difficulty  Other:  Difficulty with speech  Medical Decision Making  Medically screening exam initiated at 9:57 PM.  Appropriate orders placed.  William Acevedo was informed that the remainder of the evaluation will be completed by another provider, this initial triage assessment does not replace that evaluation, and the importance of remaining in the ED until their evaluation is complete.     Arthor Captain, PA-C 01/02/23 2159

## 2023-01-02 NOTE — ED Notes (Signed)
Patient seen by Cammy Copa, PA

## 2023-01-03 NOTE — ED Notes (Signed)
Patient decided to leave stated he can't wait any longer

## 2023-04-17 ENCOUNTER — Encounter (HOSPITAL_COMMUNITY): Payer: Self-pay

## 2023-04-17 ENCOUNTER — Emergency Department (HOSPITAL_COMMUNITY)
Admission: EM | Admit: 2023-04-17 | Discharge: 2023-04-17 | Disposition: A | Discharge status: Home / Self Care | Attending: Emergency Medicine | Admitting: Emergency Medicine

## 2023-04-17 ENCOUNTER — Other Ambulatory Visit: Payer: Self-pay

## 2023-04-17 ENCOUNTER — Emergency Department (HOSPITAL_COMMUNITY)

## 2023-04-17 DIAGNOSIS — R1084 Generalized abdominal pain: Secondary | ICD-10-CM | POA: Diagnosis present

## 2023-04-17 LAB — CBC WITH DIFFERENTIAL/PLATELET
Abs Immature Granulocytes: 0.03 10*3/uL (ref 0.00–0.07)
Basophils Absolute: 0 10*3/uL (ref 0.0–0.1)
Basophils Relative: 1 %
Eosinophils Absolute: 0 10*3/uL (ref 0.0–0.5)
Eosinophils Relative: 0 %
HCT: 40.1 % (ref 39.0–52.0)
Hemoglobin: 12.5 g/dL — ABNORMAL LOW (ref 13.0–17.0)
Immature Granulocytes: 0 %
Lymphocytes Relative: 29 %
Lymphs Abs: 2.1 10*3/uL (ref 0.7–4.0)
MCH: 27.4 pg (ref 26.0–34.0)
MCHC: 31.2 g/dL (ref 30.0–36.0)
MCV: 87.7 fL (ref 80.0–100.0)
Monocytes Absolute: 0.6 10*3/uL (ref 0.1–1.0)
Monocytes Relative: 9 %
Neutro Abs: 4.4 10*3/uL (ref 1.7–7.7)
Neutrophils Relative %: 61 %
Platelets: 360 10*3/uL (ref 150–400)
RBC: 4.57 MIL/uL (ref 4.22–5.81)
RDW: 13.8 % (ref 11.5–15.5)
WBC: 7.1 10*3/uL (ref 4.0–10.5)
nRBC: 0 % (ref 0.0–0.2)

## 2023-04-17 LAB — RESP PANEL BY RT-PCR (RSV, FLU A&B, COVID)  RVPGX2
Influenza A by PCR: NEGATIVE
Influenza B by PCR: NEGATIVE
Resp Syncytial Virus by PCR: NEGATIVE
SARS Coronavirus 2 by RT PCR: NEGATIVE

## 2023-04-17 LAB — ETHANOL: Alcohol, Ethyl (B): <10 mg/dL (ref <10)

## 2023-04-17 LAB — COMPREHENSIVE METABOLIC PANEL WITH GFR
ALT: 11 U/L (ref 0–44)
AST: 14 U/L — ABNORMAL LOW (ref 15–41)
Albumin: 3.7 g/dL (ref 3.5–5.0)
Alkaline Phosphatase: 59 U/L (ref 38–126)
Anion gap: 9 (ref 5–15)
BUN: 17 mg/dL (ref 8–23)
CO2: 26 mmol/L (ref 22–32)
Calcium: 9.3 mg/dL (ref 8.9–10.3)
Chloride: 105 mmol/L (ref 98–111)
Creatinine, Ser: 0.89 mg/dL (ref 0.61–1.24)
GFR, Estimated: >60 mL/min (ref >60)
Glucose, Bld: 110 mg/dL — ABNORMAL HIGH (ref 70–99)
Potassium: 4.2 mmol/L (ref 3.5–5.1)
Sodium: 140 mmol/L (ref 135–145)
Total Bilirubin: 0.4 mg/dL (ref 0.0–1.2)
Total Protein: 7 g/dL (ref 6.5–8.1)

## 2023-04-17 LAB — CBG MONITORING, ED: Glucose-Capillary: 103 mg/dL — ABNORMAL HIGH (ref 70–99)

## 2023-04-17 LAB — LIPASE, BLOOD: Lipase: 23 U/L (ref 11–51)

## 2023-04-17 NOTE — ED Notes (Signed)
 Reviewed discharge instructions with pt son. Pt/son verbalized understanding. Follow-up care reviewed. Pt in no acute distress at time of discharge.

## 2023-04-17 NOTE — ED Provider Notes (Signed)
 [place] EMERGENCY DEPARTMENT AT [facility] Provider Note   CSN: [id number] Arrival date & time: 04/17/23  1251     History Chief Complaint  Patient presents with   Abdominal Pain    HPI William Acevedo is a 68 y.o. male presenting for chief complaint of abdominal pain. He was at the adult center today and had a myriad of symptoms. Elevated BP at the center.  Patient's brother provides most of his ADLs and states that yesterday they had pork for lunch and states that his blood pressure has been elevated and has had some stomach upset throughout the past day. He thought he had improved by this morning when he brought him to adult daycare but per the patient's brother these are the same symptoms he was having last night. At this point the patient states that he remembers his belly was hurting earlier but states everything is resolved at this time.  He denies fevers chills nausea vomiting syncope shortness of breath.  Currently asymptomatic resting comfortably on arrival. HX of NSTEMI/SUD/AKI/GIB/CVA/Seizure Patient's recorded medical, surgical, social, medication list and allergies were reviewed in the Snapshot window as part of the initial history.   Review of Systems   Review of Systems  Constitutional:  Negative for chills and fever.  HENT:  Negative for ear pain and sore throat.   Eyes:  Negative for pain and visual disturbance.  Respiratory:  Negative for cough and shortness of breath.   Cardiovascular:  Negative for chest pain and palpitations.  Gastrointestinal:  Positive for abdominal pain and nausea. Negative for diarrhea and vomiting.  Genitourinary:  Negative for dysuria and hematuria.  Musculoskeletal:  Negative for arthralgias and back pain.  Skin:  Negative for color change and rash.  Neurological:  Negative for seizures and syncope.  All other systems reviewed and are negative.   Physical Exam Updated Vital Signs BP (!) 189/82   Pulse 86   Temp  97.8 F (36.6 C) (Oral)   Resp 14   Ht 5\' 6"  (1.676 m)   Wt 61.2 kg   SpO2 99%   BMI 21.79 kg/m  Physical Exam Vitals and nursing note reviewed.  Constitutional:      General: He is not in acute distress.    Appearance: He is well-developed.  HENT:     Head: Normocephalic and atraumatic.  Eyes:     Conjunctiva/sclera: Conjunctivae normal.  Cardiovascular:     Rate and Rhythm: Normal rate and regular rhythm.     Heart sounds: No murmur heard. Pulmonary:     Effort: Pulmonary effort is normal. No respiratory distress.     Breath sounds: Normal breath sounds.  Abdominal:     Palpations: Abdomen is soft.     Tenderness: There is no abdominal tenderness.  Musculoskeletal:        General: No swelling.     Cervical back: Neck supple.  Skin:    General: Skin is warm and dry.     Capillary Refill: Capillary refill takes less than 2 seconds.  Neurological:     Mental Status: He is alert.  Psychiatric:        Mood and Affect: Mood normal.      ED Course/ Medical Decision Making/ A&P    Procedures Procedures   Medications Ordered in ED Medications - No data to display Medical Decision Making:   William Acevedo is a 68 y.o. male who presented to the ED today with abdominal pain, detailed above.  Handoff received from EMS.  Additional history discussed with patient's family/caregivers.  Patient placed on continuous vitals and telemetry monitoring while in ED which was reviewed periodically.  Complete initial physical exam performed, notably the patient  was HDS in NAD.     Reviewed and confirmed nursing documentation for past medical history, family history, social history.    Initial Assessment:   With the patient's presentation of abdominal pain, most likely diagnosis is nonspecific etiology. Other diagnoses were considered including (but not limited to) gastroenteritis, colitis, small bowel obstruction, appendicitis, cholecystitis, pancreatitis, nephrolithiasis,  UTI, pyleonephritis. These are considered less likely due to history of present illness and physical exam findings.   This is most consistent with an acute life/limb threatening illness complicated by underlying chronic conditions.   Initial Plan:  CBC/CMP to evaluate for underlying infectious/metabolic etiology for patient's abdominal pain  Lipase to evaluate for pancreatitis  Viral screening to evaluate for infectious etiology of the symptoms. Alcohol screening performed in triage to evaluate for alternative etiology of patient's abdominal pain Chest x-ray to evaluate for free air under the diaphragm or any other acute pathology Troponin/EKG to evaluate for cardiac source of pain  Considered and requested urinalysis and repeat physical assessment to evaluate for UTI/Pyelonpehritis.  However syndrome is grossly inconsistent at this time due to lack of focal symptoms and patient has not provided a sample at this time. Discussed with family and they feel comfortable with him being released to have this done with his PCP given lack of focal identifying symptoms for this condition. Empiric management of symptoms with escalating pain control and antiemetics as needed.   Initial Study Results:   Laboratory  All laboratory results reviewed without evidence of clinically relevant pathology.    EKG EKG was reviewed independently. Rate, rhythm, axis, intervals all examined and without medically relevant abnormality. ST segments without concerns for elevations.    Radiology All images reviewed independently. Agree with radiology report at this time.   DG Chest 2 View Result Date: 04/17/2023 CLINICAL DATA:  Flu like symptoms. EXAM: CHEST - 2 VIEW COMPARISON:  02/17/2022 FINDINGS: Hyperinflation. Lateral view degraded by patient arm position. Patient rotated right on the frontal. Midline trachea. Normal heart size. Atherosclerosis in the transverse aorta. No pleural effusion or pneumothorax. No lobar  consolidation. IMPRESSION: Hyperinflation, without acute disease. Aortic Atherosclerosis (ICD10-I70.0). Electronically Signed   By: Jeronimo Greaves M.D.   On: 04/17/2023 15:19   Final Reassessment and Plan:   X-ray with no focal pathology.  All symptoms remain resolved.  Patient's been able to tolerate p.o. intake while he is being here. Family feels comfortable outpatient care management.  Patient discharged with no further acute events.  Disposition:  I have considered need for hospitalization, however, considering all of the above, I believe this patient is stable for discharge at this time.  Patient/family educated about specific return precautions for given chief complaint and symptoms.  Patient/family educated about follow-up with PCP.     Patient/family expressed understanding of return precautions and need for follow-up. Patient spoken to regarding all imaging and laboratory results and appropriate follow up for these results. All education provided in verbal form with additional information in written form. Time was allowed for answering of patient questions. Patient discharged.    Emergency Department Medication Summary:   Medications - No data to display      Clinical Impression:  1. Generalized abdominal pain      Discharge   Final Clinical Impression(s) / ED Diagnoses  Final diagnoses:  Generalized abdominal pain    Rx / DC Orders ED Discharge Orders     None         Glyn Ade, MD 04/17/23 1816

## 2023-04-17 NOTE — ED Triage Notes (Signed)
 [name]    Aox1 baseline  HR 74 RR 16 RA 97%

## 2023-05-13 ENCOUNTER — Emergency Department (HOSPITAL_COMMUNITY)

## 2023-05-13 ENCOUNTER — Inpatient Hospital Stay (HOSPITAL_COMMUNITY)
Admission: EM | Admit: 2023-05-13 | Discharge: 2023-05-20 | DRG: 917 | Disposition: A | Discharge status: Skilled Nursing Facility | Attending: Internal Medicine | Admitting: Internal Medicine

## 2023-05-13 ENCOUNTER — Observation Stay (HOSPITAL_COMMUNITY)

## 2023-05-13 DIAGNOSIS — Z8673 Personal history of transient ischemic attack (TIA), and cerebral infarction without residual deficits: Secondary | ICD-10-CM

## 2023-05-13 DIAGNOSIS — M6282 Rhabdomyolysis: Secondary | ICD-10-CM | POA: Diagnosis present

## 2023-05-13 DIAGNOSIS — F1420 Cocaine dependence, uncomplicated: Secondary | ICD-10-CM | POA: Diagnosis present

## 2023-05-13 DIAGNOSIS — T405X1A Poisoning by cocaine, accidental (unintentional), initial encounter: Principal | ICD-10-CM | POA: Diagnosis present

## 2023-05-13 DIAGNOSIS — E1140 Type 2 diabetes mellitus with diabetic neuropathy, unspecified: Secondary | ICD-10-CM | POA: Diagnosis present

## 2023-05-13 DIAGNOSIS — E11649 Type 2 diabetes mellitus with hypoglycemia without coma: Secondary | ICD-10-CM

## 2023-05-13 DIAGNOSIS — F4310 Post-traumatic stress disorder, unspecified: Secondary | ICD-10-CM | POA: Diagnosis present

## 2023-05-13 DIAGNOSIS — R402421 Glasgow coma scale score 9-12, in the field [EMT or ambulance]: Principal | ICD-10-CM

## 2023-05-13 DIAGNOSIS — F17210 Nicotine dependence, cigarettes, uncomplicated: Secondary | ICD-10-CM | POA: Diagnosis present

## 2023-05-13 DIAGNOSIS — E785 Hyperlipidemia, unspecified: Secondary | ICD-10-CM | POA: Diagnosis present

## 2023-05-13 DIAGNOSIS — I5032 Chronic diastolic (congestive) heart failure: Secondary | ICD-10-CM | POA: Diagnosis present

## 2023-05-13 DIAGNOSIS — R7989 Other specified abnormal findings of blood chemistry: Secondary | ICD-10-CM | POA: Diagnosis present

## 2023-05-13 DIAGNOSIS — I2510 Atherosclerotic heart disease of native coronary artery without angina pectoris: Secondary | ICD-10-CM | POA: Diagnosis present

## 2023-05-13 DIAGNOSIS — T50901A Poisoning by unspecified drugs, medicaments and biological substances, accidental (unintentional), initial encounter: Secondary | ICD-10-CM | POA: Diagnosis present

## 2023-05-13 DIAGNOSIS — I110 Hypertensive heart disease with heart failure: Secondary | ICD-10-CM | POA: Diagnosis present

## 2023-05-13 DIAGNOSIS — N179 Acute kidney failure, unspecified: Secondary | ICD-10-CM | POA: Diagnosis present

## 2023-05-13 DIAGNOSIS — W07XXXA Fall from chair, initial encounter: Secondary | ICD-10-CM | POA: Diagnosis present

## 2023-05-13 DIAGNOSIS — F1910 Other psychoactive substance abuse, uncomplicated: Secondary | ICD-10-CM | POA: Diagnosis present

## 2023-05-13 DIAGNOSIS — I252 Old myocardial infarction: Secondary | ICD-10-CM

## 2023-05-13 DIAGNOSIS — E119 Type 2 diabetes mellitus without complications: Secondary | ICD-10-CM

## 2023-05-13 DIAGNOSIS — G9340 Encephalopathy, unspecified: Secondary | ICD-10-CM | POA: Diagnosis present

## 2023-05-13 DIAGNOSIS — S00212A Abrasion of left eyelid and periocular area, initial encounter: Secondary | ICD-10-CM | POA: Diagnosis present

## 2023-05-13 DIAGNOSIS — G928 Other toxic encephalopathy: Secondary | ICD-10-CM | POA: Diagnosis present

## 2023-05-13 DIAGNOSIS — Q2112 Patent foramen ovale: Secondary | ICD-10-CM

## 2023-05-13 DIAGNOSIS — F602 Antisocial personality disorder: Secondary | ICD-10-CM | POA: Diagnosis present

## 2023-05-13 DIAGNOSIS — Z7984 Long term (current) use of oral hypoglycemic drugs: Secondary | ICD-10-CM

## 2023-05-13 DIAGNOSIS — Z794 Long term (current) use of insulin: Secondary | ICD-10-CM

## 2023-05-13 DIAGNOSIS — T50904A Poisoning by unspecified drugs, medicaments and biological substances, undetermined, initial encounter: Secondary | ICD-10-CM

## 2023-05-13 DIAGNOSIS — E860 Dehydration: Secondary | ICD-10-CM | POA: Diagnosis present

## 2023-05-13 DIAGNOSIS — F209 Schizophrenia, unspecified: Secondary | ICD-10-CM | POA: Diagnosis present

## 2023-05-13 DIAGNOSIS — E8721 Acute metabolic acidosis: Secondary | ICD-10-CM | POA: Diagnosis present

## 2023-05-13 LAB — BASIC METABOLIC PANEL WITH GFR
Anion gap: 17 — ABNORMAL HIGH (ref 5–15)
BUN: 32 mg/dL — ABNORMAL HIGH (ref 8–23)
CO2: 17 mmol/L — ABNORMAL LOW (ref 22–32)
Calcium: 9 mg/dL (ref 8.9–10.3)
Chloride: 104 mmol/L (ref 98–111)
Creatinine, Ser: 1.34 mg/dL — ABNORMAL HIGH (ref 0.61–1.24)
GFR, Estimated: 57 mL/min — ABNORMAL LOW (ref >60)
Glucose, Bld: 122 mg/dL — ABNORMAL HIGH (ref 70–99)
Potassium: 4.5 mmol/L (ref 3.5–5.1)
Sodium: 138 mmol/L (ref 135–145)

## 2023-05-13 LAB — I-STAT VENOUS BLOOD GAS, ED
Acid-base deficit: 4 mmol/L — ABNORMAL HIGH (ref 0.0–2.0)
Bicarbonate: 20.8 mmol/L (ref 20.0–28.0)
Calcium, Ion: 1.09 mmol/L — ABNORMAL LOW (ref 1.15–1.40)
HCT: 44 % (ref 39.0–52.0)
Hemoglobin: 15 g/dL (ref 13.0–17.0)
O2 Saturation: 69 %
Potassium: 4.9 mmol/L (ref 3.5–5.1)
Sodium: 138 mmol/L (ref 135–145)
TCO2: 22 mmol/L (ref 22–32)
pCO2, Ven: 38 mmHg — ABNORMAL LOW (ref 44–60)
pH, Ven: 7.347 (ref 7.25–7.43)
pO2, Ven: 38 mmHg (ref 32–45)

## 2023-05-13 LAB — CBC WITH DIFFERENTIAL/PLATELET
Abs Immature Granulocytes: 0.02 10*3/uL (ref 0.00–0.07)
Basophils Absolute: 0 10*3/uL (ref 0.0–0.1)
Basophils Relative: 0 %
Eosinophils Absolute: 0 10*3/uL (ref 0.0–0.5)
Eosinophils Relative: 0 %
HCT: 43.7 % (ref 39.0–52.0)
Hemoglobin: 13.6 g/dL (ref 13.0–17.0)
Immature Granulocytes: 0 %
Lymphocytes Relative: 18 %
Lymphs Abs: 1.3 10*3/uL (ref 0.7–4.0)
MCH: 27.3 pg (ref 26.0–34.0)
MCHC: 31.1 g/dL (ref 30.0–36.0)
MCV: 87.8 fL (ref 80.0–100.0)
Monocytes Absolute: 0.4 10*3/uL (ref 0.1–1.0)
Monocytes Relative: 6 %
Neutro Abs: 5.6 10*3/uL (ref 1.7–7.7)
Neutrophils Relative %: 76 %
Platelets: 250 10*3/uL (ref 150–400)
RBC: 4.98 MIL/uL (ref 4.22–5.81)
RDW: 14.6 % (ref 11.5–15.5)
WBC: 7.3 10*3/uL (ref 4.0–10.5)
nRBC: 0 % (ref 0.0–0.2)

## 2023-05-13 LAB — COMPREHENSIVE METABOLIC PANEL WITH GFR
ALT: 15 U/L (ref 0–44)
AST: 30 U/L (ref 15–41)
Albumin: 3.9 g/dL (ref 3.5–5.0)
Alkaline Phosphatase: 49 U/L (ref 38–126)
Anion gap: 18 — ABNORMAL HIGH (ref 5–15)
BUN: 36 mg/dL — ABNORMAL HIGH (ref 8–23)
CO2: 17 mmol/L — ABNORMAL LOW (ref 22–32)
Calcium: 9 mg/dL (ref 8.9–10.3)
Chloride: 104 mmol/L (ref 98–111)
Creatinine, Ser: 1.37 mg/dL — ABNORMAL HIGH (ref 0.61–1.24)
GFR, Estimated: 56 mL/min — ABNORMAL LOW (ref >60)
Glucose, Bld: 160 mg/dL — ABNORMAL HIGH (ref 70–99)
Potassium: 4.6 mmol/L (ref 3.5–5.1)
Sodium: 139 mmol/L (ref 135–145)
Total Bilirubin: 1.2 mg/dL (ref 0.0–1.2)
Total Protein: 8 g/dL (ref 6.5–8.1)

## 2023-05-13 LAB — I-STAT CHEM 8, ED
BUN: 45 mg/dL — ABNORMAL HIGH (ref 8–23)
Calcium, Ion: 1.04 mmol/L — ABNORMAL LOW (ref 1.15–1.40)
Chloride: 107 mmol/L (ref 98–111)
Creatinine, Ser: 1.3 mg/dL — ABNORMAL HIGH (ref 0.61–1.24)
Glucose, Bld: 161 mg/dL — ABNORMAL HIGH (ref 70–99)
HCT: 46 % (ref 39.0–52.0)
Hemoglobin: 15.6 g/dL (ref 13.0–17.0)
Potassium: 4.9 mmol/L (ref 3.5–5.1)
Sodium: 138 mmol/L (ref 135–145)
TCO2: 21 mmol/L — ABNORMAL LOW (ref 22–32)

## 2023-05-13 LAB — OSMOLALITY: Osmolality: 313 mosm/kg — ABNORMAL HIGH (ref 275–295)

## 2023-05-13 LAB — URINALYSIS, COMPLETE (UACMP) WITH MICROSCOPIC
Bacteria, UA: NONE SEEN
Bilirubin Urine: NEGATIVE
Glucose, UA: >=500 mg/dL — AB
Hgb urine dipstick: NEGATIVE
Ketones, ur: 20 mg/dL — AB
Leukocytes,Ua: NEGATIVE
Nitrite: NEGATIVE
Protein, ur: 30 mg/dL — AB
Specific Gravity, Urine: 1.015 (ref 1.005–1.030)
pH: 5 (ref 5.0–8.0)

## 2023-05-13 LAB — CBG MONITORING, ED: Glucose-Capillary: 157 mg/dL — ABNORMAL HIGH (ref 70–99)

## 2023-05-13 LAB — GLUCOSE, CAPILLARY
Glucose-Capillary: 97 mg/dL (ref 70–99)
Glucose-Capillary: 95 mg/dL (ref 70–99)

## 2023-05-13 LAB — RAPID URINE DRUG SCREEN, HOSP PERFORMED
Amphetamines: NOT DETECTED
Barbiturates: NOT DETECTED
Benzodiazepines: NOT DETECTED
Cocaine: POSITIVE — AB
Opiates: NOT DETECTED
Tetrahydrocannabinol: NOT DETECTED

## 2023-05-13 LAB — TROPONIN I (HIGH SENSITIVITY): Troponin I (High Sensitivity): 20 ng/L — ABNORMAL HIGH (ref <18)

## 2023-05-13 LAB — AMMONIA: Ammonia: 35 umol/L (ref 9–35)

## 2023-05-13 LAB — MAGNESIUM: Magnesium: 2.5 mg/dL — ABNORMAL HIGH (ref 1.7–2.4)

## 2023-05-13 LAB — HEMOGLOBIN A1C
Hgb A1c MFr Bld: 8.6 % — ABNORMAL HIGH (ref 4.8–5.6)
Mean Plasma Glucose: 200.12 mg/dL

## 2023-05-13 LAB — LACTIC ACID, PLASMA: Lactic Acid, Venous: 1.3 mmol/L (ref 0.5–1.9)

## 2023-05-13 LAB — ACETAMINOPHEN LEVEL: Acetaminophen (Tylenol), Serum: <10 ug/mL — ABNORMAL LOW (ref 10–30)

## 2023-05-13 LAB — TSH: TSH: 1.518 u[IU]/mL (ref 0.350–4.500)

## 2023-05-13 LAB — BETA-HYDROXYBUTYRIC ACID: Beta-Hydroxybutyric Acid: 4.12 mmol/L — ABNORMAL HIGH (ref 0.05–0.27)

## 2023-05-13 LAB — CK: Total CK: 503 U/L — ABNORMAL HIGH (ref 49–397)

## 2023-05-13 LAB — PHOSPHORUS: Phosphorus: 5 mg/dL — ABNORMAL HIGH (ref 2.5–4.6)

## 2023-05-13 LAB — ETHANOL: Alcohol, Ethyl (B): <15 mg/dL (ref <15)

## 2023-05-13 LAB — SALICYLATE LEVEL: Salicylate Lvl: <7 mg/dL — ABNORMAL LOW (ref 7.0–30.0)

## 2023-05-13 MED ORDER — NALOXONE HCL 0.4 MG/ML IJ SOLN: 0.4000 mg | Freq: Once | INTRAMUSCULAR | Status: DC

## 2023-05-13 MED ORDER — NALOXONE HCL 0.4 MG/ML IJ SOLN
0.4000 mg | Freq: Once | INTRAMUSCULAR | Status: AC
  Administered 2023-05-13: 0.4 mg via INTRAVENOUS
  Filled 2023-05-13: qty 1

## 2023-05-13 MED ORDER — TRAZODONE HCL 50 MG PO TABS
50.0000 mg | ORAL_TABLET | Freq: Every day | ORAL | Status: DC
  Administered 2023-05-14 – 2023-05-19 (×6): 50 mg via ORAL
  Filled 2023-05-13 (×6): qty 1

## 2023-05-13 MED ORDER — SODIUM CHLORIDE 0.9 % IV SOLN: INTRAVENOUS | Status: AC

## 2023-05-13 MED ORDER — LORAZEPAM 2 MG/ML IJ SOLN
1.0000 mg | Freq: Once | INTRAMUSCULAR | Status: AC
Start: 2023-05-13 — End: 2023-05-13
  Administered 2023-05-13: 1 mg via INTRAVENOUS
  Filled 2023-05-13: qty 1

## 2023-05-13 MED ORDER — HYDROCODONE-ACETAMINOPHEN 5-325 MG PO TABS: 1.0000 | ORAL_TABLET | ORAL | Status: DC | PRN

## 2023-05-13 MED ORDER — LACTATED RINGERS IV BOLUS
500.0000 mL | Freq: Once | INTRAVENOUS | Status: AC
Start: 2023-05-13 — End: 2023-05-13
  Administered 2023-05-13: 500 mL via INTRAVENOUS

## 2023-05-13 MED ORDER — LORAZEPAM 2 MG/ML IJ SOLN
2.0000 mg | Freq: Once | INTRAMUSCULAR | Status: AC
  Administered 2023-05-13: 2 mg via INTRAVENOUS
  Filled 2023-05-13: qty 1

## 2023-05-13 MED ORDER — ACETAMINOPHEN 325 MG PO TABS: 650.0000 mg | ORAL_TABLET | Freq: Four times a day (QID) | ORAL | Status: DC | PRN

## 2023-05-13 MED ORDER — LABETALOL HCL 5 MG/ML IV SOLN
10.0000 mg | INTRAVENOUS | Status: DC | PRN
  Administered 2023-05-13 – 2023-05-15 (×3): 10 mg via INTRAVENOUS
  Filled 2023-05-13 (×3): qty 4

## 2023-05-13 MED ORDER — INSULIN GLARGINE-YFGN 100 UNIT/ML ~~LOC~~ SOLN
5.0000 [IU] | Freq: Every day | SUBCUTANEOUS | Status: DC
  Administered 2023-05-14 – 2023-05-19 (×6): 5 [IU] via SUBCUTANEOUS
  Filled 2023-05-13 (×9): qty 0.05

## 2023-05-13 MED ORDER — ACETAMINOPHEN 650 MG RE SUPP: 650.0000 mg | Freq: Four times a day (QID) | RECTAL | Status: DC | PRN

## 2023-05-13 MED ORDER — INSULIN ASPART 100 UNIT/ML IJ SOLN
0.0000 [IU] | INTRAMUSCULAR | Status: DC
  Administered 2023-05-14: 1 [IU] via SUBCUTANEOUS

## 2023-05-13 MED ORDER — THIAMINE HCL 100 MG/ML IJ SOLN
100.0000 mg | Freq: Every day | INTRAMUSCULAR | Status: DC
  Administered 2023-05-13 – 2023-05-14 (×2): 100 mg via INTRAVENOUS
  Filled 2023-05-13 (×2): qty 2

## 2023-05-13 MED ORDER — ONDANSETRON HCL 4 MG PO TABS: 4.0000 mg | ORAL_TABLET | Freq: Four times a day (QID) | ORAL | Status: DC | PRN

## 2023-05-13 MED ORDER — NALOXONE HCL 0.4 MG/ML IJ SOLN
0.2000 mg | Freq: Once | INTRAMUSCULAR | Status: AC
  Administered 2023-05-13: 0.2 mg via INTRAVENOUS
  Filled 2023-05-13: qty 1

## 2023-05-13 MED ORDER — ONDANSETRON HCL 4 MG/2ML IJ SOLN: 4.0000 mg | Freq: Four times a day (QID) | INTRAMUSCULAR | Status: DC | PRN

## 2023-05-13 MED ORDER — LACTATED RINGERS IV BOLUS
500.0000 mL | Freq: Once | INTRAVENOUS | Status: AC
  Administered 2023-05-13: 500 mL via INTRAVENOUS

## 2023-05-13 NOTE — Assessment & Plan Note (Signed)
 Once patient is able to be alert and oriented would need to discuss with him importance of discontinuation cocaine abuse.

## 2023-05-13 NOTE — Assessment & Plan Note (Signed)
 Most likely acute toxic encephalopathy in the setting of substance overdose U tox positive for cocaine And is possible that patient has consumed a sedating substance that is not being picked up by urine drug screen MRI head non acute No seizure-like activity noted with for completion can obtain EEG ammonia  level unremarkable VBG unremarkable No evidence of infection we will continue to monitor supportive management

## 2023-05-13 NOTE — ED Notes (Signed)
 Patient transported to MRI

## 2023-05-13 NOTE — Progress Notes (Signed)
EEG complete. Results pending.  ?

## 2023-05-13 NOTE — Assessment & Plan Note (Signed)
 Avoid over aggressive fluid resuscitation

## 2023-05-13 NOTE — ED Notes (Signed)
 Called CCMD.

## 2023-05-13 NOTE — Assessment & Plan Note (Signed)
 In the setting of dehydration and substance abuse.  Will rehydrate obtain urine electrolytes

## 2023-05-13 NOTE — ED Triage Notes (Signed)
 PT here via [facility] from a residence where people (who said they don't really know him) said they saw him fall out a chair and he was unresponsive.  Initially pt only responsive to sternal rub.  After 0.5 mg of narcan  given, rr improved from 8 - 15 and pt would swing at EMS with both hands, from time to time, although he appeared to have a L sided gaze.    CBG 179 02 97% with nasal trumpet Rr 12 Bp 188/99

## 2023-05-13 NOTE — Subjective & Objective (Signed)
 Patient with history of polysubstance abuse was in remission when apparently started to use again [place] bus [place] sustained a fall bystanders called EMS. Narcan  was given with minimal response he continues to be fairly lethargic although at some point able to answer what his name is. He is able to squeeze on command and withdraws to pain unable to tell what he took U-Tox positive for cocaine

## 2023-05-13 NOTE — ED Notes (Signed)
 Called patient  brother William Acevedo  [phone] update given.

## 2023-05-13 NOTE — ED Notes (Signed)
 Patient is a little more alert now when ask what was going all he would say is what difference does it make

## 2023-05-13 NOTE — ED Provider Notes (Signed)
  Physical Exam  BP (!) 184/82   Pulse 86   Temp 98.2 F (36.8 C)   Resp 16   SpO2 100%   Physical Exam  Procedures  Procedures  ED Course / MDM    Medical Decision Making Amount and/or Complexity of Data Reviewed Labs: ordered. Radiology: ordered.  Risk Prescription drug management.   Melody Spurling, assumed care for this patient.  In brief, 68 year old male who came in after he was witnessed to lose consciousness by bystanders.  He received Narcan  in the field which reportedly had some effect.  In the emergency room, patient was lethargic, appeared under the influence of an unknown substance.  UDS did come back for cocaine.  Patient had CT imaging MRI that were negative.  Labs otherwise unremarkable.  Attempted give the patient some additional Narcan  to see if there would be improvement in his mental status.  Unfortunately, there is no response.  I believe the patient likely has another sedative on board like xylazine.  Will admit patient for altered mental status likely due to polysubstance use.  Nursing staff spoke with the patient's brother who reported that the patient had been sober for 2 and half years, but appears to have relapsed yesterday.       Afton Horse T, DO 05/13/23 1836

## 2023-05-13 NOTE — ED Notes (Signed)
 Pt to CT with RN

## 2023-05-13 NOTE — Progress Notes (Signed)
 Patient arrived to unit via ED. Patient Aox2 (self and place). Vitals and focused assessment completed.   Patient belongings: Cell phone, bag of clothes and cane at patients bedside.   Bed in lowest position, calllight in reach, grip socks applied, bed alarm on and floor matts down.

## 2023-05-13 NOTE — H&P (Signed)
 William Acevedo VOZ:[id number] DOB: 1955-12-18 DOA: 05/13/2023     PCP: Maryellen Snare, NP    Patient arrived to ER on 05/13/23 at 1201 Referred by Attending Selene Dais, MD   Patient coming from:   Patient was found down on a bus stop    Chief Complaint:   Found down   HPI: William Acevedo is a 68 y.o. male with medical history significant of polysubstance abuse, DM2 depression history of stroke neuropathy hypertension GI bleed CAD schizophrenia    Presented with altered state of consciousness Patient with history of polysubstance abuse was in remission when apparently started to use again was at the bus stop flan sustained a fall bystanders called EMS. Narcan  was given with minimal response he continues to be fairly lethargic although at some point able to answer what his name is. He is able to squeeze on command and withdraws to pain unable to tell what he took U-Tox positive for cocaine   Patient unable to provide any history of his own    Regarding pertinent Chronic problems:    Hyperlipidemia - on statins Lipitor  (atorvastatin ) Zetia  Lipid Panel     Component Value Date/Time   CHOL 342 (H) 07/17/2022 0122   TRIG 65 07/17/2022 0122   HDL 59 07/17/2022 0122   CHOLHDL 5.8 07/17/2022 0122   VLDL 13 07/17/2022 0122   LDLCALC 270 (H) 07/17/2022 0122       chronic CHF diastolic - last echo  Recent Results (from the past 42595 hours)  ECHOCARDIOGRAM COMPLETE   Collection Time: 07/17/22 12:13 PM  Result Value   Weight 1,844.81   Height 66   BP 150/79   S' Lateral 2.40   Area-P 1/2 1.86   MV VTI 1.77   Est EF 70 - 75%   Narrative      ECHOCARDIOGRAM REPORT         Sonographer Comments: Suboptimal parasternal window and suboptimal apical window. Image acquisition challenging due to patient body habitus, Image acquisition challenging due to respiratory motion and Image acquisition challenging due to uncooperative  patient. IMPRESSIONS    1.  Left ventricular ejection fraction, by estimation, is 70 to 75%. The left ventricle has hyperdynamic function. The left ventricle has no regional wall motion abnormalities. Left ventricular diastolic parameters are consistent with Grade I diastolic  dysfunction (impaired relaxation). Mid-cavitary gradient of around 30 mm Hg.  2. Right ventricular systolic function is normal. The right ventricular size is normal. Tricuspid regurgitation signal is inadequate for assessing PA pressure.  3. The mitral valve was not well visualized. No evidence of mitral valve regurgitation. No evidence of mitral stenosis.  4. The aortic valve was not well visualized. Aortic valve regurgitation is not visualized. No aortic stenosis is present.  5. The inferior vena cava is normal in size with greater than 50% respiratory variability, suggesting right atrial pressure of 3 mmHg.  Comparison(s): No significant change from prior study.            CAD  - On  , statin,       DM 2 -  Lab Results  Component Value Date   HGBA1C 10.6 (H) 01/23/2022   on insulin ?    Hx of CVA -  with/out residual deficits     While in ER:   Found to have mild AKI creatinine up to 137 cocaine positive VBG showed no evidence of hyper [place]    Lab Orders  Comprehensive metabolic panel         CBC with Differential/Platelet         Urinalysis, Routine w reflex microscopic -Urine, Clean Catch         Ammonia          Rapid urine drug screen (hospital performed)         Ethanol         Magnesium          Lactic acid, plasma         Acetaminophen  level         Salicylate level         CBG monitoring, ED         I-Stat Chem 8, ED         I-Stat venous blood gas, ED      CT HEAD /cervical neckNo evidence of acute intracranial or cervical spine injury    MRI brain  Stable remote left ACA and right parietal cortical infarcts.  Stable remote PCA territory infarcts bilaterally.  Stable remote lacunar infarcts of both  cerebellar hemispheres   CXR -  NON acute  Following Medications were ordered in ER: Medications  naloxone  (NARCAN ) injection 0.4 mg (0.4 mg Intravenous Given 05/13/23 1257)  lactated ringers  bolus 500 mL (0 mLs Intravenous Stopped 05/13/23 1414)  LORazepam  (ATIVAN ) injection 1 mg (1 mg Intravenous Given 05/13/23 1340)  lactated ringers  bolus 500 mL (0 mLs Intravenous Stopped 05/13/23 1919)  LORazepam  (ATIVAN ) injection 2 mg (2 mg Intravenous Given 05/13/23 1528)  naloxone  (NARCAN ) injection 0.2 mg (0.2 mg Intravenous Given 05/13/23 1823)    _______________________________________________________   ED Triage Vitals  Encounter Vitals Group     BP 05/13/23 1213 (!) 197/90     Systolic BP Percentile --      Diastolic BP Percentile --      Pulse Rate 05/13/23 1213 80     Resp 05/13/23 1213 13     Temp 05/13/23 1215 (!) 97.3 F (36.3 C)     Temp Source 05/13/23 1215 Tympanic     SpO2 05/13/23 1213 100 %     Weight --      Height --      Head Circumference --      Peak Flow --      Pain Score --      Pain Loc --      Pain Education --      Exclude from Growth Chart --   ZDGU(44)@     _________________________________________ Significant initial  Findings: Abnormal Labs Reviewed  COMPREHENSIVE METABOLIC PANEL WITH GFR - Abnormal; Notable for the following components:      Result Value   CO2 17 (*)    Glucose, Bld 160 (*)    BUN 36 (*)    Creatinine, Ser 1.37 (*)    GFR, Estimated 56 (*)    Anion gap 18 (*)    All other components within normal limits  RAPID URINE DRUG SCREEN, HOSP PERFORMED - Abnormal; Notable for the following components:   Cocaine POSITIVE (*)    All other components within normal limits  MAGNESIUM  - Abnormal; Notable for the following components:   Magnesium  2.5 (*)    All other components within normal limits  ACETAMINOPHEN  LEVEL - Abnormal; Notable for the following components:   Acetaminophen  (Tylenol ), Serum <10 (*)    All other components within  normal limits  SALICYLATE LEVEL - Abnormal; Notable for the following components:   Salicylate Lvl <7.0 (*)  All other components within normal limits  CBG MONITORING, ED - Abnormal; Notable for the following components:   Glucose-Capillary 157 (*)    All other components within normal limits  I-STAT CHEM 8, ED - Abnormal; Notable for the following components:   BUN 45 (*)    Creatinine, Ser 1.30 (*)    Glucose, Bld 161 (*)    Calcium , Ion 1.04 (*)    TCO2 21 (*)    All other components within normal limits  I-STAT VENOUS BLOOD GAS, ED - Abnormal; Notable for the following components:   pCO2, Ven 38.0 (*)    Acid-base deficit 4.0 (*)    Calcium , Ion 1.09 (*)    All other components within normal limits      __________   ECG: Ordered Personally reviewed and interpreted by me showing: HR : 84 Rhythm:Sinus rhythm Right atrial enlargement Borderline prolonged QT interval  QTC 478   The recent clinical data is shown below. Vitals:   05/13/23 1750 05/13/23 1800 05/13/23 1845 05/13/23 1919  BP: (!) 213/89 (!) 184/82 (!) 166/72   Pulse: 91 86 79   Resp: 17 16 18    Temp: 98.2 F (36.8 C)   (!) 97.4 F (36.3 C)  TempSrc:    Temporal  SpO2: 100% 100% 98%     WBC     Component Value Date/Time   WBC 7.3 05/13/2023 1239   LYMPHSABS 1.3 05/13/2023 1239   MONOABS 0.4 05/13/2023 1239   EOSABS 0.0 05/13/2023 1239   BASOSABS 0.0 05/13/2023 1239     Lactic Acid, Venous    Component Value Date/Time   LATICACIDVEN 1.3 05/13/2023 1653      UA   ordered     ________________________________________________________________    Blood Gas result:  pH   7.347 Sodium 138 mmol/L   pCO2, Ven 38.0 Low  mmHg Potassium 4.9 mmol/L  pO2, Ven 38 mmHg      ABG    Component Value Date/Time   HCO3 20.8 05/13/2023 1307   TCO2 21 (L) 05/13/2023 1307   TCO2 22 05/13/2023 1307   ACIDBASEDEF 4.0 (H) 05/13/2023 1307   O2SAT 69 05/13/2023 1307        __________________________________________________________ Recent Labs  Lab 05/13/23 1239 05/13/23 1307 05/13/23 1653  NA 139 138  138  --   K 4.6 4.9  4.9  --   CO2 17*  --   --   GLUCOSE 160* 161*  --   BUN 36* 45*  --   CREATININE 1.37* 1.30*  --   CALCIUM  9.0  --   --   MG  --   --  2.5*    Cr   Up from baseline see below Lab Results  Component Value Date   CREATININE 1.30 (H) 05/13/2023   CREATININE 1.37 (H) 05/13/2023   CREATININE 0.89 04/17/2023    Recent Labs  Lab 05/13/23 1239  AST 30  ALT 15  ALKPHOS 49  BILITOT 1.2  PROT 8.0  ALBUMIN 3.9   Lab Results  Component Value Date   CALCIUM  9.0 05/13/2023   PHOS 4.0 07/19/2022    Plt: Lab Results  Component Value Date   PLT 250 05/13/2023    Recent Labs  Lab 05/13/23 1239 05/13/23 1307  WBC 7.3  --   NEUTROABS 5.6  --   HGB 13.6 15.6  15.0  HCT 43.7 46.0  44.0  MCV 87.8  --   PLT 250  --  HG/HCT stable,      Component Value Date/Time   HGB 15.6 05/13/2023 1307   HGB 15.0 05/13/2023 1307   HCT 46.0 05/13/2023 1307   HCT 44.0 05/13/2023 1307   MCV 87.8 05/13/2023 1239      No results for input(s): "LIPASE", "AMYLASE" in the last 168 hours. Recent Labs  Lab 05/13/23 1239  AMMONIA  35      _______________________________________________ Hospitalist was called for admission for acute toxic encephalopathy    The following Work up has been ordered so far:  Orders Placed This Encounter  Procedures   CT HEAD WO CONTRAST   CT CERVICAL SPINE WO CONTRAST   DG Chest Port 1 View   MR BRAIN WO CONTRAST   Comprehensive metabolic panel   CBC with Differential/Platelet   Urinalysis, Routine w reflex microscopic -Urine, Clean Catch   Ammonia    Rapid urine drug screen (hospital performed)   Ethanol   Magnesium    Lactic acid, plasma   Acetaminophen  level   Salicylate level   ED Cardiac monitoring   Initiate Carrier Fluid Protocol   Cardiac Monitoring - Continuous Indefinite    Consult for Unassigned Medical Admission   CBG monitoring, ED   I-Stat Chem 8, ED   I-Stat venous blood gas, ED   EKG 12-Lead   Insert peripheral IV   Place in observation (patient's expected length of stay will be less than 2 midnights)     OTHER Significant initial  Findings:  labs showing:     DM  labs:  HbA1C: No results for input(s): "HGBA1C" in the last 8760 hours.     CBG (last 3)  Recent Labs    05/13/23 1215  GLUCAP 157*          Cultures:    Component Value Date/Time   SDES  07/16/2022 1645    URINE, CLEAN CATCH Performed at [facility], [address], [place], [place] [address]    SPECREQUEST  07/16/2022 1645    NONE Performed at [facility], [address] [place], [place] [address]    CULT MULTIPLE SPECIES PRESENT, SUGGEST RECOLLECTION (A) 07/16/2022 1645   REPTSTATUS 07/19/2022 FINAL 07/16/2022 1645     Radiological Exams on Admission: MR BRAIN WO CONTRAST Result Date: 05/13/2023 CLINICAL DATA:  Altered mental status. EXAM: MRI HEAD WITHOUT CONTRAST TECHNIQUE: Multiplanar, multiecho pulse sequences of the brain and surrounding structures were obtained without intravenous contrast. COMPARISON:  CT head without contrast 05/13/2023. MR head without contrast 01/02/2023. FINDINGS: Brain: No acute infarct, hemorrhage, or mass lesion is present. Remote left ACA and right parietal cortical infarcts are stable. Remote PCA territory infarcts are stable bilaterally in the medial left occipital lobe and lateral right occipital lobe. Remote ischemic changes extend into the brainstem, stable. Remote lacunar infarcts of both cerebellar hemispheres are stable. No significant extraaxial fluid collection is present. The internal auditory canals are within normal limits. Vascular: Flow is present in the major intracranial arteries. Skull and upper cervical spine: Degenerative changes are present the upper cervical spine. Craniocervical  junction is normal. Marrow signal is normal. Sinuses/Orbits: The paranasal sinuses and mastoid air cells are clear. The globes and orbits are within normal limits. IMPRESSION: 1. No acute intracranial abnormality or significant interval change. 2. Stable remote left ACA and right parietal cortical infarcts. 3. Stable remote PCA territory infarcts bilaterally. 4. Stable remote lacunar infarcts of both cerebellar hemispheres. Electronically Signed   By: Audree Leas M.D.   On: 05/13/2023  17:32   DG Chest Port 1 View Result Date: 05/13/2023 CLINICAL DATA:  Fall.  Altered mental status.  Endocarditis. EXAM: PORTABLE CHEST 1 VIEW COMPARISON:  Two-view chest x-ray 04/17/2023. FINDINGS: The heart size is normal. Patient is rotated to the right. No edema or effusion is present. No focal airspace disease is present. The visualized soft tissues bony thorax are unremarkable. IMPRESSION: No acute cardiopulmonary disease. Electronically Signed   By: Audree Leas M.D.   On: 05/13/2023 12:49   CT HEAD WO CONTRAST Result Date: 05/13/2023 CLINICAL DATA:  Mental status change with unknown cause. Neck trauma EXAM: CT HEAD WITHOUT CONTRAST CT CERVICAL SPINE WITHOUT CONTRAST TECHNIQUE: Multidetector CT imaging of the head and cervical spine was performed following the standard protocol without intravenous contrast. Multiplanar CT image reconstructions of the cervical spine were also generated. RADIATION DOSE REDUCTION: This exam was performed according to the departmental dose-optimization program which includes automated exposure control, adjustment of the mA and/or kV according to patient size and/or use of iterative reconstruction technique. COMPARISON:  07/16/2022 head CT FINDINGS: CT HEAD FINDINGS Brain: Small chronic infarcts in the bilateral cerebellum and scattered along the bilateral cerebral cortex-largest being in the parasagittal anterior left frontal lobe, right parietal cortex, and bilateral occipital  cortex. No acute hemorrhage, hydrocephalus, mass, or collection. Ischemic gliosis in the cerebral white matter. Vascular: No hyperdense vessel or unexpected calcification. Skull: No acute finding Sinuses/Orbits: No visible injury, dysconjugate gaze is nonspecific. CT CERVICAL SPINE FINDINGS Alignment: Mild degenerative anterolisthesis at C3-4 and C7-T1. Skull base and vertebrae: No acute fracture. No primary bone lesion or focal pathologic process. Soft tissues and spinal canal: No prevertebral fluid or swelling. No visible canal hematoma. Disc levels:  Generalized degenerative endplate and facet spurring. Upper chest: No evidence of injury IMPRESSION: No evidence of acute intracranial or cervical spine injury. Advanced chronic ischemic injury that is stable since 01/02/2023 brain MRI Electronically Signed   By: Ronnette Coke M.D.   On: 05/13/2023 12:43   CT CERVICAL SPINE WO CONTRAST Result Date: 05/13/2023 CLINICAL DATA:  Mental status change with unknown cause. Neck trauma EXAM: CT HEAD WITHOUT CONTRAST CT CERVICAL SPINE WITHOUT CONTRAST TECHNIQUE: Multidetector CT imaging of the head and cervical spine was performed following the standard protocol without intravenous contrast. Multiplanar CT image reconstructions of the cervical spine were also generated. RADIATION DOSE REDUCTION: This exam was performed according to the departmental dose-optimization program which includes automated exposure control, adjustment of the mA and/or kV according to patient size and/or use of iterative reconstruction technique. COMPARISON:  07/16/2022 head CT FINDINGS: CT HEAD FINDINGS Brain: Small chronic infarcts in the bilateral cerebellum and scattered along the bilateral cerebral cortex-largest being in the parasagittal anterior left frontal lobe, right parietal cortex, and bilateral occipital cortex. No acute hemorrhage, hydrocephalus, mass, or collection. Ischemic gliosis in the cerebral white matter. Vascular: No  hyperdense vessel or unexpected calcification. Skull: No acute finding Sinuses/Orbits: No visible injury, dysconjugate gaze is nonspecific. CT CERVICAL SPINE FINDINGS Alignment: Mild degenerative anterolisthesis at C3-4 and C7-T1. Skull base and vertebrae: No acute fracture. No primary bone lesion or focal pathologic process. Soft tissues and spinal canal: No prevertebral fluid or swelling. No visible canal hematoma. Disc levels:  Generalized degenerative endplate and facet spurring. Upper chest: No evidence of injury IMPRESSION: No evidence of acute intracranial or cervical spine injury. Advanced chronic ischemic injury that is stable since 01/02/2023 brain MRI Electronically Signed   By: Ronnette Coke M.D.   On: 05/13/2023 12:43  _______________________________________________________________________________________________________ Latest  Blood pressure (!) 166/72, pulse 79, temperature (!) 97.4 F (36.3 C), temperature source Temporal, resp. rate 18, SpO2 98%.   Vitals  labs and radiology finding personally reviewed  Review of Systems:    Pertinent positives include: somnolence  Constitutional:  No weight loss, night sweats, Fevers, chills, fatigue, weight loss  HEENT:  No headaches, Difficulty swallowing,Tooth/dental problems,Sore throat,  No sneezing, itching, ear ache, nasal congestion, post nasal drip,  Cardio-vascular:  No chest pain, Orthopnea, PND, anasarca, dizziness, palpitations.no Bilateral lower extremity swelling  GI:  No heartburn, indigestion, abdominal pain, nausea, vomiting, diarrhea, change in bowel habits, loss of appetite, melena, blood in stool, hematemesis Resp:  no shortness of breath at rest. No dyspnea on exertion, No excess mucus, no productive cough, No non-productive cough, No coughing up of blood.No change in color of mucus.No wheezing. Skin:  no rash or lesions. No jaundice GU:  no dysuria, change in color of urine, no urgency or frequency. No straining  to urinate.  No flank pain.  Musculoskeletal:  No joint pain or no joint swelling. No decreased range of motion. No back pain.  Psych:  No change in mood or affect. No depression or anxiety. No memory loss.  Neuro: no localizing neurological complaints, no tingling, no weakness, no double vision, no gait abnormality, no slurred speech, no confusion  All systems reviewed and apart from HOPI all are negative _______________________________________________________________________________________________ Past Medical History:   Past Medical History:  Diagnosis Date   Carpal tunnel syndrome    Cocaine use    Depression    Diabetes mellitus without complication (HCC)    Endocarditis    ETOH abuse    Hypertensive urgency 04/14/2020   Neuropathy    PTSD (post-traumatic stress disorder)    Stroke (cerebrum) (HCC)    multiple   Zoster       Past Surgical History:  Procedure Laterality Date   BUBBLE STUDY  03/30/2020   Procedure: BUBBLE STUDY;  Surgeon: Hugh Madura, MD;  Location: [facility] ENDOSCOPY;  Service: Cardiovascular;;   TEE WITHOUT CARDIOVERSION N/A 03/30/2020   Procedure: TRANSESOPHAGEAL ECHOCARDIOGRAM (TEE);  Surgeon: Hugh Madura, MD;  Location: [facility] ENDOSCOPY;  Service: Cardiovascular;  Laterality: N/A;   TESTICLE TORSION REDUCTION      Social History:  Ambulatory   independently      reports that he has been smoking cigarettes. He has never used smokeless tobacco. He reports current alcohol use of about 7.0 standard drinks of alcohol per week. He reports that he does not currently use drugs after having used the following drugs: Cocaine.    Family History: Noncontributory patient unable to provide ______________________________________________________________________________________________ Allergies: Allergies  Allergen Reactions   Lisinopril Swelling and Other (See Comments)    Angioedema and the patient stated it made his lips swell  Pt reports not allergic  06/07/20    Omeprazole Swelling and Other (See Comments)    ANGIOEDEMA OF LIPS, Angioedema of tongue Pt reports not allergic 06/07/20     Prior to Admission medications   Medication Sig Start Date End Date Taking? Authorizing Provider  acetaminophen  (TYLENOL ) 325 MG tablet Take 1-2 tablets (325-650 mg total) by mouth every 4 (four) hours as needed for mild pain. Patient not taking: Reported on 07/18/2022 03/04/22   Love, Renay Carota, PA-C  atorvastatin  (LIPITOR ) 80 MG tablet Take 1 tablet (80 mg total) by mouth daily. 07/19/22 08/18/22  Etter Hermann., MD  blood glucose meter kit and supplies KIT Dispense  based on patient and insurance preference. Use up to four times daily as directed. (FOR ICD-9 250.00, 250.01). Patient not taking: Reported on 07/18/2022 09/13/19   Sueellen Emery, MD  Blood Glucose Monitoring Suppl DEVI 1 each by Does not apply route 3 (three) times daily. May dispense any manufacturer covered by patient's insurance. 07/19/22   Etter Hermann., MD  DULoxetine  (CYMBALTA ) 60 MG capsule Take 1 capsule (60 mg total) by mouth daily. For chronic pain and mood 07/19/22 09/17/22  Etter Hermann., MD  ezetimibe  (ZETIA ) 10 MG tablet Take 1 tablet (10 mg total) by mouth daily. 07/20/22 08/19/22  Etter Hermann., MD  famotidine  (PEPCID ) 20 MG tablet Take 1 tablet (20 mg total) by mouth daily before breakfast. 07/19/22 09/17/22  Etter Hermann., MD  gabapentin  (NEURONTIN ) 300 MG capsule Take 1 capsule (300 mg total) by mouth 2 (two) times daily. 07/19/22 09/17/22  Etter Hermann., MD  Glucose Blood (BLOOD GLUCOSE TEST STRIPS) STRP 1 each by Does not apply route 3 (three) times daily. Use as directed to check blood sugar. May dispense any manufacturer covered by patient's insurance and fits patient's device. 07/19/22   Etter Hermann., MD  insulin  glargine (LANTUS ) 100 UNIT/ML Solostar Pen Inject 12 Units into the skin daily. Please follow up with your PCP for adjustments  to your insulin  regimen 07/19/22   Etter Hermann., MD  insulin  lispro (HUMALOG ) 100 UNIT/ML injection Inject 0.04 mLs (4 Units total) into the skin 3 (three) times daily with meals. Please follow up with your PCP for adjustments of your insulin  regimen 07/19/22   Etter Hermann., MD  Insulin  Pen Needle (PEN NEEDLES) 31G X 5 MM MISC 1 Application by Does not apply route daily at 6 (six) AM. 03/04/22   Love, Renay Carota, PA-C  Lancet Device MISC 1 each by Does not apply route 3 (three) times daily. May dispense any manufacturer covered by patient's insurance. 07/19/22   Etter Hermann., MD  melatonin 3 MG TABS tablet Take 2 tablets (6 mg total) by mouth at bedtime as needed (for sleep). Patient not taking: Reported on 07/18/2022 03/04/22   Love, Renay Carota, PA-C  metFORMIN  (GLUCOPHAGE ) 500 MG tablet Take 1 tablet (500 mg total) by mouth 2 (two) times daily with a meal. Follow up with your PCP to discuss uptitration of this medicine 07/19/22 08/18/22  Etter Hermann., MD  metoprolol  succinate (TOPROL -XL) 50 MG 24 hr tablet Take 1 tablet (50 mg total) by mouth daily. Take with or immediately following a meal. 07/19/22 08/18/22  Etter Hermann., MD  Multiple Vitamin (MULTIVITAMIN WITH MINERALS) TABS tablet Take 1 tablet by mouth daily. Patient not taking: Reported on 07/18/2022 03/04/22   Love, Renay Carota, PA-C  senna-docusate (SENOKOT-S) 8.6-50 MG tablet Take 1 tablet by mouth at bedtime as needed for mild constipation. Patient not taking: Reported on 07/18/2022 03/04/22   Love, Renay Carota, PA-C  traZODone  (DESYREL ) 50 MG tablet Take 1 tablet (50 mg total) by mouth at bedtime. 07/19/22 09/17/22  Etter Hermann., MD    ___________________________________________________________________________________________________ Physical Exam:    05/13/2023    6:45 PM 05/13/2023    6:00 PM 05/13/2023    5:50 PM  Vitals with BMI  Systolic 166 184 782  Diastolic 72 82 89  Pulse 79 86 91     1. General:   in No  Acute distress  Chronically ill   -appearing 2. Psychological: Somnolent not oriented  3. Head/ENT:    Dry Mucous Membranes                          Head Non traumatic, neck supple                           Poor Dentition 4. SKIN: decreased Skin turgor,  Skin clean Dry and intact no rash    5. Heart: Rapid regular rate and rhythm no  Murmur, no Rub or gallop 6. Lungs no wheezes or crackles   7. Abdomen: Soft,  non-tender, Non distended bowel sounds present 8. Lower extremities: no clubbing, cyanosis, no  edema 9. Neurologically Grossly intact, moving all 4 extremities equally withdraws to pain   10. MSK: Normal range of motion    Chart has been reviewed  _____________ ___________________________  Assessment/Plan 68 y.o. male with medical history significant of polysubstance abuse, DM2 depression history of stroke neuropathy hypertension GI bleed CAD schizophrenia   Admitted for acute toxic encephalopathy     Present on Admission:  Overdose  Cocaine use disorder, severe, dependence (HCC)  AKI (acute kidney injury) (HCC)  Polysubstance abuse (HCC)  Acute encephalopathy  Avoid     Cocaine use disorder, severe, dependence (HCC) Once patient is able to be alert and oriented would need to discuss with him importance of discontinuation cocaine abuse.  AKI (acute kidney injury) (HCC) In the setting of dehydration and substance abuse.  Will rehydrate obtain urine electrolytes  DM2 (diabetes mellitus, type 2) (HCC)  - Order Sensitive  SSI   - continue home insulin  but decreased to  5units,  -  check TSH and HgA1C  - Hold by mouth medications    Polysubstance abuse (HCC) Urine positive for cocaine Given patient's sedation it is possible there is other substances that unfortunately were not able to pick up in the urine toxicity screen MRI negative for new CVA continue to monitor on  Acute encephalopathy Most likely acute toxic encephalopathy in the setting of  substance overdose U tox positive for cocaine And is possible that patient has consumed a sedating substance that is not being picked up by urine drug screen MRI head non acute No seizure-like activity noted with for completion can obtain EEG ammonia  level unremarkable VBG unremarkable No evidence of infection we will continue to monitor supportive management  Avoid Avoid over aggressive fluid resuscitation    Other plan as per orders.  DVT prophylaxis:  SCD     Code Status:    Code Status: Prior FULL CODE    Family Communication:   Family not at  Bedside    Diet n.p.o.   Disposition Plan:      To home once workup is complete and patient is stable   Following barriers for discharge:                             Mental status improved                      Transition of care consulted                   Consults called: None   Admission status:  ED Disposition     ED Disposition  Admit   Condition  --   Comment  Hospital Area: [facility] [[id number]]  Level of Care: Progressive [102]  Admit to Progressive based on following criteria: NEUROLOGICAL AND NEUROSURGICAL complex patients with significant risk of instability, who do not meet ICU criteria, yet require close observation or frequent assessment (< / = every 2 - 4 hours) with medical / nursing intervention.  May place patient in observation at [facility] or [facility] if equivalent level of care is available:: No  Covid Evaluation: Asymptomatic - no recent exposure (last 10 days) testing not required  Diagnosis: Overdose [202577]  Admitting Physician: Kanae Ignatowski [[id number]]  Attending Physician: Jolena Kittle [[id number]]           Obs     Level of care        progressive      tele indefinitely please discontinue once patient no longer qualifies COVID-19 Labs  Elevated   Elbert Polyakov 05/13/2023, 7:55 PM    [facility]     after 2 AM please page floor coverage   If  7AM-7PM, please contact the day team taking care of the patient using [email or web address]    Finding mild heart like it looks like heart like for rare Advil 

## 2023-05-13 NOTE — Assessment & Plan Note (Signed)
-   Order Sensitive  SSI   - continue home insulin but decreased to  5 units,  -  check TSH and HgA1C  - Hold by mouth medications   

## 2023-05-13 NOTE — ED Notes (Signed)
 Patient will open his eyes to voice , and stated he needed to pee urinal given , patient urinated. Nasal trumpet taken out. Patient undressed and gown put on.

## 2023-05-13 NOTE — ED Provider Notes (Signed)
 [place] EMERGENCY DEPARTMENT AT [facility] Provider Note   CSN: [id number] Arrival date & time: 05/13/23  1201     History  No chief complaint on file.   KIERAN RIMER is a 68 y.o. male.  HPI Patient presents for altered mental status.  Medical history includes DM, depression, polysubstance abuse, CVA, neuropathy, HTN, GI bleed, CAD, schizophrenia.  He is brought in by EMS.  They were called by bystanders who reportedly witnessed the patient go unresponsive and fall from a chair.  EMS reports GCS of 5 initially.  He did respond to 0.5 mg of IV Narcan .  He was not conversant during transit.  He would move all extremities to painful stimuli.  Vital signs were notable for hypertension following Narcan .  SpO2 is normal on room air.  Patient is unable to provide any history at this time.    Home Medications Prior to Admission medications   Medication Sig Start Date End Date Taking? Authorizing Provider  acetaminophen  (TYLENOL ) 325 MG tablet Take 1-2 tablets (325-650 mg total) by mouth every 4 (four) hours as needed for mild pain. Patient not taking: Reported on 07/18/2022 03/04/22   Love, Renay Carota, PA-C  atorvastatin  (LIPITOR ) 80 MG tablet Take 1 tablet (80 mg total) by mouth daily. 07/19/22 08/18/22  Etter Hermann., MD  blood glucose meter kit and supplies KIT Dispense based on patient and insurance preference. Use up to four times daily as directed. (FOR ICD-9 250.00, 250.01). Patient not taking: Reported on 07/18/2022 09/13/19   Sueellen Emery, MD  Blood Glucose Monitoring Suppl DEVI 1 each by Does not apply route 3 (three) times daily. May dispense any manufacturer covered by patient's insurance. 07/19/22   Etter Hermann., MD  DULoxetine  (CYMBALTA ) 60 MG capsule Take 1 capsule (60 mg total) by mouth daily. For chronic pain and mood 07/19/22 09/17/22  Etter Hermann., MD  ezetimibe  (ZETIA ) 10 MG tablet Take 1 tablet (10 mg total) by mouth daily. 07/20/22 08/19/22   Etter Hermann., MD  famotidine  (PEPCID ) 20 MG tablet Take 1 tablet (20 mg total) by mouth daily before breakfast. 07/19/22 09/17/22  Etter Hermann., MD  gabapentin  (NEURONTIN ) 300 MG capsule Take 1 capsule (300 mg total) by mouth 2 (two) times daily. 07/19/22 09/17/22  Etter Hermann., MD  Glucose Blood (BLOOD GLUCOSE TEST STRIPS) STRP 1 each by Does not apply route 3 (three) times daily. Use as directed to check blood sugar. May dispense any manufacturer covered by patient's insurance and fits patient's device. 07/19/22   Etter Hermann., MD  insulin  glargine (LANTUS ) 100 UNIT/ML Solostar Pen Inject 12 Units into the skin daily. Please follow up with your PCP for adjustments to your insulin  regimen 07/19/22   Etter Hermann., MD  insulin  lispro (HUMALOG ) 100 UNIT/ML injection Inject 0.04 mLs (4 Units total) into the skin 3 (three) times daily with meals. Please follow up with your PCP for adjustments of your insulin  regimen 07/19/22   Etter Hermann., MD  Insulin  Pen Needle (PEN NEEDLES) 31G X 5 MM MISC 1 Application by Does not apply route daily at 6 (six) AM. 03/04/22   Love, Renay Carota, PA-C  Lancet Device MISC 1 each by Does not apply route 3 (three) times daily. May dispense any manufacturer covered by patient's insurance. 07/19/22   Etter Hermann., MD  melatonin 3 MG TABS tablet Take 2 tablets (6 mg total)  by mouth at bedtime as needed (for sleep). Patient not taking: Reported on 07/18/2022 03/04/22   Love, Renay Carota, PA-C  metFORMIN  (GLUCOPHAGE ) 500 MG tablet Take 1 tablet (500 mg total) by mouth 2 (two) times daily with a meal. Follow up with your PCP to discuss uptitration of this medicine 07/19/22 08/18/22  Etter Hermann., MD  metoprolol  succinate (TOPROL -XL) 50 MG 24 hr tablet Take 1 tablet (50 mg total) by mouth daily. Take with or immediately following a meal. 07/19/22 08/18/22  Etter Hermann., MD  Multiple Vitamin (MULTIVITAMIN WITH MINERALS) TABS  tablet Take 1 tablet by mouth daily. Patient not taking: Reported on 07/18/2022 03/04/22   Love, Renay Carota, PA-C  senna-docusate (SENOKOT-S) 8.6-50 MG tablet Take 1 tablet by mouth at bedtime as needed for mild constipation. Patient not taking: Reported on 07/18/2022 03/04/22   Love, Renay Carota, PA-C  traZODone  (DESYREL ) 50 MG tablet Take 1 tablet (50 mg total) by mouth at bedtime. 07/19/22 09/17/22  Etter Hermann., MD      Allergies    Lisinopril and Omeprazole    Review of Systems   Review of Systems  Unable to perform ROS: Mental status change    Physical Exam Updated Vital Signs BP (!) 177/88   Pulse 87   Temp (!) 97.3 F (36.3 C) (Tympanic)   Resp 15   SpO2 100%  Physical Exam Vitals and nursing note reviewed.  Constitutional:      General: He is not in acute distress.    Appearance: Normal appearance. He is well-developed. He is not toxic-appearing or diaphoretic.  HENT:     Head: Normocephalic.     Comments: Mild abrasion to left eyebrow area    Right Ear: External ear normal.     Left Ear: External ear normal.     Nose: Nose normal.     Mouth/Throat:     Mouth: Mucous membranes are moist.  Eyes:     Conjunctiva/sclera: Conjunctivae normal.     Comments: Left gaze deviation.  Eyes go to midline when he is awakened by painful stimuli.  Cardiovascular:     Rate and Rhythm: Normal rate and regular rhythm.     Heart sounds: No murmur heard. Pulmonary:     Effort: Pulmonary effort is normal. No respiratory distress.     Breath sounds: Normal breath sounds.  Chest:     Chest wall: No tenderness.  Abdominal:     General: There is no distension.     Palpations: Abdomen is soft.     Tenderness: There is no abdominal tenderness.  Musculoskeletal:        General: No swelling or deformity.     Cervical back: Neck supple.  Skin:    General: Skin is warm and dry.     Coloration: Skin is not jaundiced or pale.  Neurological:     Mental Status: He is alert.     GCS: GCS  eye subscore is 2. GCS verbal subscore is 2. GCS motor subscore is 5.     ED Results / Procedures / Treatments   Labs (all labs ordered are listed, but only abnormal results are displayed) Labs Reviewed  COMPREHENSIVE METABOLIC PANEL WITH GFR - Abnormal; Notable for the following components:      Result Value   CO2 17 (*)    Glucose, Bld 160 (*)    BUN 36 (*)    Creatinine, Ser 1.37 (*)    GFR, Estimated 56 (*)  Anion gap 18 (*)    All other components within normal limits  RAPID URINE DRUG SCREEN, HOSP PERFORMED - Abnormal; Notable for the following components:   Cocaine POSITIVE (*)    All other components within normal limits  CBG MONITORING, ED - Abnormal; Notable for the following components:   Glucose-Capillary 157 (*)    All other components within normal limits  I-STAT CHEM 8, ED - Abnormal; Notable for the following components:   BUN 45 (*)    Creatinine, Ser 1.30 (*)    Glucose, Bld 161 (*)    Calcium , Ion 1.04 (*)    TCO2 21 (*)    All other components within normal limits  I-STAT VENOUS BLOOD GAS, ED - Abnormal; Notable for the following components:   pCO2, Ven 38.0 (*)    Acid-base deficit 4.0 (*)    Calcium , Ion 1.09 (*)    All other components within normal limits  CBC WITH DIFFERENTIAL/PLATELET  AMMONIA   ETHANOL  URINALYSIS, ROUTINE W REFLEX MICROSCOPIC  MAGNESIUM   LACTIC ACID, PLASMA  ACETAMINOPHEN  LEVEL  SALICYLATE LEVEL    EKG EKG Interpretation Date/Time:  Saturday May 13 2023 12:08:30 EDT Ventricular Rate:  84 PR Interval:  152 QRS Duration:  83 QT Interval:  404 QTC Calculation: 478 R Axis:   25  Text Interpretation: Sinus rhythm Right atrial enlargement Borderline prolonged QT interval Confirmed by Iva Mariner [phone]) on 05/13/2023 2:58:43 PM  Radiology DG Chest Port 1 View Result Date: 05/13/2023 CLINICAL DATA:  Fall.  Altered mental status.  Endocarditis. EXAM: PORTABLE CHEST 1 VIEW COMPARISON:  Two-view chest x-ray 04/17/2023.  FINDINGS: The heart size is normal. Patient is rotated to the right. No edema or effusion is present. No focal airspace disease is present. The visualized soft tissues bony thorax are unremarkable. IMPRESSION: No acute cardiopulmonary disease. Electronically Signed   By: Audree Leas M.D.   On: 05/13/2023 12:49   CT HEAD WO CONTRAST Result Date: 05/13/2023 CLINICAL DATA:  Mental status change with unknown cause. Neck trauma EXAM: CT HEAD WITHOUT CONTRAST CT CERVICAL SPINE WITHOUT CONTRAST TECHNIQUE: Multidetector CT imaging of the head and cervical spine was performed following the standard protocol without intravenous contrast. Multiplanar CT image reconstructions of the cervical spine were also generated. RADIATION DOSE REDUCTION: This exam was performed according to the departmental dose-optimization program which includes automated exposure control, adjustment of the mA and/or kV according to patient size and/or use of iterative reconstruction technique. COMPARISON:  07/16/2022 head CT FINDINGS: CT HEAD FINDINGS Brain: Small chronic infarcts in the bilateral cerebellum and scattered along the bilateral cerebral cortex-largest being in the parasagittal anterior left frontal lobe, right parietal cortex, and bilateral occipital cortex. No acute hemorrhage, hydrocephalus, mass, or collection. Ischemic gliosis in the cerebral white matter. Vascular: No hyperdense vessel or unexpected calcification. Skull: No acute finding Sinuses/Orbits: No visible injury, dysconjugate gaze is nonspecific. CT CERVICAL SPINE FINDINGS Alignment: Mild degenerative anterolisthesis at C3-4 and C7-T1. Skull base and vertebrae: No acute fracture. No primary bone lesion or focal pathologic process. Soft tissues and spinal canal: No prevertebral fluid or swelling. No visible canal hematoma. Disc levels:  Generalized degenerative endplate and facet spurring. Upper chest: No evidence of injury IMPRESSION: No evidence of acute  intracranial or cervical spine injury. Advanced chronic ischemic injury that is stable since 01/02/2023 brain MRI Electronically Signed   By: Ronnette Coke M.D.   On: 05/13/2023 12:43   CT CERVICAL SPINE WO CONTRAST Result Date: 05/13/2023 CLINICAL DATA:  Mental status change with unknown cause. Neck trauma EXAM: CT HEAD WITHOUT CONTRAST CT CERVICAL SPINE WITHOUT CONTRAST TECHNIQUE: Multidetector CT imaging of the head and cervical spine was performed following the standard protocol without intravenous contrast. Multiplanar CT image reconstructions of the cervical spine were also generated. RADIATION DOSE REDUCTION: This exam was performed according to the departmental dose-optimization program which includes automated exposure control, adjustment of the mA and/or kV according to patient size and/or use of iterative reconstruction technique. COMPARISON:  07/16/2022 head CT FINDINGS: CT HEAD FINDINGS Brain: Small chronic infarcts in the bilateral cerebellum and scattered along the bilateral cerebral cortex-largest being in the parasagittal anterior left frontal lobe, right parietal cortex, and bilateral occipital cortex. No acute hemorrhage, hydrocephalus, mass, or collection. Ischemic gliosis in the cerebral white matter. Vascular: No hyperdense vessel or unexpected calcification. Skull: No acute finding Sinuses/Orbits: No visible injury, dysconjugate gaze is nonspecific. CT CERVICAL SPINE FINDINGS Alignment: Mild degenerative anterolisthesis at C3-4 and C7-T1. Skull base and vertebrae: No acute fracture. No primary bone lesion or focal pathologic process. Soft tissues and spinal canal: No prevertebral fluid or swelling. No visible canal hematoma. Disc levels:  Generalized degenerative endplate and facet spurring. Upper chest: No evidence of injury IMPRESSION: No evidence of acute intracranial or cervical spine injury. Advanced chronic ischemic injury that is stable since 01/02/2023 brain MRI Electronically  Signed   By: Ronnette Coke M.D.   On: 05/13/2023 12:43    Procedures Procedures    Medications Ordered in ED Medications  lactated ringers  bolus 500 mL (has no administration in time range)  naloxone  (NARCAN ) injection 0.4 mg (0.4 mg Intravenous Given 05/13/23 1257)  lactated ringers  bolus 500 mL (0 mLs Intravenous Stopped 05/13/23 1414)  LORazepam  (ATIVAN ) injection 1 mg (1 mg Intravenous Given 05/13/23 1340)    ED Course/ Medical Decision Making/ A&P                                 Medical Decision Making Amount and/or Complexity of Data Reviewed Labs: ordered. Radiology: ordered.  Risk Prescription drug management.   This patient presents to the ED for concern of altered mental status, this involves an extensive number of treatment options, and is a complaint that carries with it a high risk of complications and morbidity.  The differential diagnosis includes intoxication, CVA, seizure, ICH, polypharmacy   Co morbidities that complicate the patient evaluation  DM, depression, polysubstance abuse, CVA, neuropathy, HTN, GI bleed, CAD, schizophrenia   Additional history obtained:  Additional history obtained from EMS External records from outside source obtained and reviewed including EMR   Lab Tests:  I Ordered, and personally interpreted labs.  The pertinent results include: Creatinine is increased from baseline.  An anion gap metabolic acidosis is present.  Hemoglobin is normal.  No leukocytosis is present.  UDS was positive for cocaine.   Imaging Studies ordered:  I ordered imaging studies including chest x-ray, CT head, CT cervical spine; MRI brain I independently visualized and interpreted imaging which showed no acute findings on x-ray or CT scans; MRI pending at time of signout. I agree with the radiologist interpretation   Cardiac Monitoring: / EKG:  The patient was maintained on a cardiac monitor.  I personally viewed and interpreted the cardiac  monitored which showed an underlying rhythm of: Sinus rhythm   Problem List / ED Course / Critical interventions / Medication management  Patient presenting for altered mental status.  EMS reports initial GCS of 5.  He did respond to Narcan .  On arrival, GCS is 9.  Physical exam notable for left gaze deviation.  He appears to have some right-sided neglect and does not blink to threat.  When awakened with painful stimuli, his eyes do go back to midline.  I was unable to get him to cross midline.  When awakened, he also does appear to be moving all extremities.  There is some mild abrasions to the left eyebrow and nose area.  This is likely from his reported fall out of a chair.  There are no other areas of exam findings to suggest acute injuries.  Workup was initiated.  Patient had no improvement with ED dose of Narcan .  Chest x-ray and CT images of head and cervical spine did not show acute findings.  While in the ED, patient was able to request to urinate to the nurse.  This is an improvement in his mentation.  His lab work was notable for an increase in creatinine from baseline and anion gap metabolic acidosis.  Additional labs were ordered.  These and MRI of brain were pending at time of signout.  Care of patient was signed out to oncoming ED provider. I ordered medication including IV fluids for hydration; Ativan  for sympathomimetic drug use/possible seizure; Narcan  for empiric treatment of opiate narcosis Reevaluation of the patient after these medicines showed that the patient improved I have reviewed the patients home medicines and have made adjustments as needed  Social Determinants of Health:  History of polysubstance abuse        Final Clinical Impression(s) / ED Diagnoses Final diagnoses:  Glasgow coma scale total score 9-12, in the field (EMT or ambulance)    Rx / DC Orders ED Discharge Orders     None         Iva Mariner, MD 05/13/23 1519

## 2023-05-13 NOTE — Assessment & Plan Note (Signed)
 Urine positive for cocaine Given patient's sedation it is possible there is other substances that unfortunately were not able to pick up in the urine toxicity screen MRI negative for new CVA continue to monitor on

## 2023-05-14 ENCOUNTER — Inpatient Hospital Stay (HOSPITAL_COMMUNITY)

## 2023-05-14 DIAGNOSIS — E785 Hyperlipidemia, unspecified: Secondary | ICD-10-CM | POA: Diagnosis present

## 2023-05-14 DIAGNOSIS — W07XXXA Fall from chair, initial encounter: Secondary | ICD-10-CM | POA: Diagnosis present

## 2023-05-14 DIAGNOSIS — G928 Other toxic encephalopathy: Secondary | ICD-10-CM | POA: Diagnosis present

## 2023-05-14 DIAGNOSIS — E8721 Acute metabolic acidosis: Secondary | ICD-10-CM | POA: Diagnosis present

## 2023-05-14 DIAGNOSIS — E119 Type 2 diabetes mellitus without complications: Secondary | ICD-10-CM | POA: Diagnosis present

## 2023-05-14 DIAGNOSIS — Z7984 Long term (current) use of oral hypoglycemic drugs: Secondary | ICD-10-CM | POA: Diagnosis not present

## 2023-05-14 DIAGNOSIS — S00212A Abrasion of left eyelid and periocular area, initial encounter: Secondary | ICD-10-CM | POA: Diagnosis present

## 2023-05-14 DIAGNOSIS — N179 Acute kidney failure, unspecified: Secondary | ICD-10-CM | POA: Diagnosis present

## 2023-05-14 DIAGNOSIS — F602 Antisocial personality disorder: Secondary | ICD-10-CM | POA: Diagnosis present

## 2023-05-14 DIAGNOSIS — R4182 Altered mental status, unspecified: Secondary | ICD-10-CM

## 2023-05-14 DIAGNOSIS — I252 Old myocardial infarction: Secondary | ICD-10-CM | POA: Diagnosis not present

## 2023-05-14 DIAGNOSIS — Q2112 Patent foramen ovale: Secondary | ICD-10-CM | POA: Diagnosis not present

## 2023-05-14 DIAGNOSIS — I2510 Atherosclerotic heart disease of native coronary artery without angina pectoris: Secondary | ICD-10-CM | POA: Diagnosis present

## 2023-05-14 DIAGNOSIS — R569 Unspecified convulsions: Secondary | ICD-10-CM | POA: Diagnosis not present

## 2023-05-14 DIAGNOSIS — R7989 Other specified abnormal findings of blood chemistry: Secondary | ICD-10-CM | POA: Diagnosis present

## 2023-05-14 DIAGNOSIS — G9340 Encephalopathy, unspecified: Secondary | ICD-10-CM | POA: Diagnosis not present

## 2023-05-14 DIAGNOSIS — F1910 Other psychoactive substance abuse, uncomplicated: Secondary | ICD-10-CM | POA: Diagnosis not present

## 2023-05-14 DIAGNOSIS — T50904A Poisoning by unspecified drugs, medicaments and biological substances, undetermined, initial encounter: Secondary | ICD-10-CM | POA: Diagnosis not present

## 2023-05-14 DIAGNOSIS — Z794 Long term (current) use of insulin: Secondary | ICD-10-CM | POA: Diagnosis not present

## 2023-05-14 DIAGNOSIS — M6282 Rhabdomyolysis: Secondary | ICD-10-CM | POA: Diagnosis present

## 2023-05-14 DIAGNOSIS — T405X1A Poisoning by cocaine, accidental (unintentional), initial encounter: Secondary | ICD-10-CM | POA: Diagnosis present

## 2023-05-14 DIAGNOSIS — I110 Hypertensive heart disease with heart failure: Secondary | ICD-10-CM | POA: Diagnosis present

## 2023-05-14 DIAGNOSIS — E860 Dehydration: Secondary | ICD-10-CM | POA: Diagnosis present

## 2023-05-14 DIAGNOSIS — F17210 Nicotine dependence, cigarettes, uncomplicated: Secondary | ICD-10-CM | POA: Diagnosis present

## 2023-05-14 DIAGNOSIS — F1420 Cocaine dependence, uncomplicated: Secondary | ICD-10-CM | POA: Diagnosis present

## 2023-05-14 DIAGNOSIS — F4310 Post-traumatic stress disorder, unspecified: Secondary | ICD-10-CM | POA: Diagnosis present

## 2023-05-14 DIAGNOSIS — E1140 Type 2 diabetes mellitus with diabetic neuropathy, unspecified: Secondary | ICD-10-CM | POA: Diagnosis present

## 2023-05-14 DIAGNOSIS — I5032 Chronic diastolic (congestive) heart failure: Secondary | ICD-10-CM | POA: Diagnosis present

## 2023-05-14 DIAGNOSIS — F209 Schizophrenia, unspecified: Secondary | ICD-10-CM | POA: Diagnosis present

## 2023-05-14 DIAGNOSIS — Z8673 Personal history of transient ischemic attack (TIA), and cerebral infarction without residual deficits: Secondary | ICD-10-CM | POA: Diagnosis not present

## 2023-05-14 LAB — GLUCOSE, CAPILLARY
Glucose-Capillary: 113 mg/dL — ABNORMAL HIGH (ref 70–99)
Glucose-Capillary: 121 mg/dL — ABNORMAL HIGH (ref 70–99)
Glucose-Capillary: 98 mg/dL (ref 70–99)
Glucose-Capillary: 152 mg/dL — ABNORMAL HIGH (ref 70–99)
Glucose-Capillary: 112 mg/dL — ABNORMAL HIGH (ref 70–99)
Glucose-Capillary: 169 mg/dL — ABNORMAL HIGH (ref 70–99)

## 2023-05-14 LAB — COMPREHENSIVE METABOLIC PANEL WITH GFR
ALT: 14 U/L (ref 0–44)
AST: 21 U/L (ref 15–41)
Albumin: 3.4 g/dL — ABNORMAL LOW (ref 3.5–5.0)
Alkaline Phosphatase: 46 U/L (ref 38–126)
Anion gap: 15 (ref 5–15)
BUN: 26 mg/dL — ABNORMAL HIGH (ref 8–23)
CO2: 18 mmol/L — ABNORMAL LOW (ref 22–32)
Calcium: 8.4 mg/dL — ABNORMAL LOW (ref 8.9–10.3)
Chloride: 106 mmol/L (ref 98–111)
Creatinine, Ser: 1.14 mg/dL (ref 0.61–1.24)
GFR, Estimated: >60 mL/min (ref >60)
Glucose, Bld: 122 mg/dL — ABNORMAL HIGH (ref 70–99)
Potassium: 3.9 mmol/L (ref 3.5–5.1)
Sodium: 139 mmol/L (ref 135–145)
Total Bilirubin: 1.1 mg/dL (ref 0.0–1.2)
Total Protein: 7 g/dL (ref 6.5–8.1)

## 2023-05-14 LAB — CBC
HCT: 37 % — ABNORMAL LOW (ref 39.0–52.0)
Hemoglobin: 11.6 g/dL — ABNORMAL LOW (ref 13.0–17.0)
MCH: 27.1 pg (ref 26.0–34.0)
MCHC: 31.4 g/dL (ref 30.0–36.0)
MCV: 86.4 fL (ref 80.0–100.0)
Platelets: 261 10*3/uL (ref 150–400)
RBC: 4.28 MIL/uL (ref 4.22–5.81)
RDW: 14.5 % (ref 11.5–15.5)
WBC: 6.9 10*3/uL (ref 4.0–10.5)
nRBC: 0 % (ref 0.0–0.2)

## 2023-05-14 LAB — MAGNESIUM: Magnesium: 2.3 mg/dL (ref 1.7–2.4)

## 2023-05-14 LAB — HIV ANTIBODY (ROUTINE TESTING W REFLEX): HIV Screen 4th Generation wRfx: NONREACTIVE

## 2023-05-14 LAB — TROPONIN I (HIGH SENSITIVITY)
Troponin I (High Sensitivity): 16 ng/L (ref <18)
Troponin I (High Sensitivity): 15 ng/L (ref <18)

## 2023-05-14 LAB — OSMOLALITY, URINE: Osmolality, Ur: 599 mosm/kg (ref 300–900)

## 2023-05-14 LAB — PHOSPHORUS: Phosphorus: 4.2 mg/dL (ref 2.5–4.6)

## 2023-05-14 LAB — CREATININE, URINE, RANDOM: Creatinine, Urine: 54 mg/dL

## 2023-05-14 LAB — CK: Total CK: 412 U/L — ABNORMAL HIGH (ref 49–397)

## 2023-05-14 LAB — SODIUM, URINE, RANDOM: Sodium, Ur: 97 mmol/L

## 2023-05-14 LAB — AMMONIA: Ammonia: 15 umol/L (ref 9–35)

## 2023-05-14 MED ORDER — FAMOTIDINE 20 MG PO TABS
20.0000 mg | ORAL_TABLET | Freq: Every day | ORAL | Status: DC
  Administered 2023-05-14 – 2023-05-20 (×7): 20 mg via ORAL
  Filled 2023-05-14 (×7): qty 1

## 2023-05-14 MED ORDER — SODIUM CHLORIDE 0.9 % IV SOLN: INTRAVENOUS | Status: AC

## 2023-05-14 MED ORDER — METOPROLOL SUCCINATE ER 50 MG PO TB24
50.0000 mg | ORAL_TABLET | Freq: Every day | ORAL | Status: DC
  Administered 2023-05-14 – 2023-05-20 (×7): 50 mg via ORAL
  Filled 2023-05-14 (×7): qty 1

## 2023-05-14 MED ORDER — ONDANSETRON HCL 4 MG/2ML IJ SOLN
4.0000 mg | Freq: Four times a day (QID) | INTRAMUSCULAR | Status: DC | PRN
Start: 2023-05-14 — End: 2023-05-20
  Administered 2023-05-14 – 2023-05-15 (×2): 4 mg via INTRAVENOUS
  Filled 2023-05-14 (×2): qty 2

## 2023-05-14 MED ORDER — INSULIN ASPART 100 UNIT/ML IJ SOLN
4.0000 [IU] | Freq: Three times a day (TID) | INTRAMUSCULAR | Status: DC
  Administered 2023-05-14 – 2023-05-20 (×15): 4 [IU] via SUBCUTANEOUS

## 2023-05-14 MED ORDER — ONDANSETRON 4 MG PO TBDP: 4.0000 mg | ORAL_TABLET | Freq: Three times a day (TID) | ORAL | Status: DC | PRN

## 2023-05-14 NOTE — Progress Notes (Signed)
 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 subsequently as well as 02/2022 went to [facility] Known PFO mitral valve degeneration/endocarditis 2022 Very poorly controlled DM TY 2 Most recent hospitalization 07/16/2022 through 07/19/2022 acute metabolic encephalopathy in the setting of polypharmacy-at that time found to have right renal infarct polypharmacy was diminished and Zyprexa  was held Foley catheter was placed he was also also have multiple pulmonary nodules requiring outpatient follow-up  4/26 re-presented with altered mental status GCS 5 received Narcan -able to protect airway GCS increased to 9 In ER workup after giving Narcan  he admitted to using cocaine recently UDS positive Sodium 139 potassium 4.6 CO2 17 BUN/creatinine 36/1.3 (above baseline 17/1.0 )LFTs normal CPK 543 troponin 20 osmolality 313 lactic acid 1.3 CBC 7.3 hemoglobin 13.6 platelet 250 CT cervical spine degenerative anterolisthesis C3-C4 + C7-T1 no acute intracranial cervical spine injury or intracranial damage with advanced ischemic chronic injury and brain CXR no acute pulmonary disease right brain no acute intracranial abnormality stable remote left ACA right parietal cortical infarcts EEG performed showing mild to moderate diffuse encephalopathy   Plan  Toxic metabolic encephalopathy in the setting of cocaine use disorder Quite sleepy/lethargic this morning-stop trazodone  at night, do not resume Cymbalta  60 limit gabapentin  additionally as well as do not resume melatonin and reassess again later today if possible    Mild rhabdomyolysis, mild metabolic acidosis Elevated troponin Probably interrelated-no reports of chest pain although peaked T waves--does not appear to be new Cycle troponin for completion sake, recheck CK additionally monitor on telemetry---trop neg--likely 2/2 rhabdo/aki  AKI on admission Much improved and likely secondary to rhabdo from either cocaine or being down-would continue saline 100 cc/H Recheck labs a.m.  Polysubstance abuse Needs to quit-unsure if he ever will  2-3 separate episodes of embolic CVA Not on aspirin  nor Plavix  at this time for unclear reason-when more awake resume Lipitor , Zetia  resume as outpatient will inquire  Known PFO?  Endocarditis/differential mitral valve degeneration Resume Toprol -XL 50 when more awake  diabetes mellitus-A1c 8.6 CBG ranging 90-1 20-placed on Lantus  5, sliding scale every 4 as not eating just yet  Previous right renal infarct requiring Foley Kidney function improving-no improvement with current measures may need bladder scan as previously required hold  DVT prophylaxis: SCD  Status is: Observation The patient will require care spanning > 2 midnights and should be moved to inpatient because:   Sick      Subjective: Sleepy and incoherent-falls back to sleep after arousing and stimulating Last medication given was trazodone  nothing else to sedate him  He was more arousable still confused ~ 11:30 when I reviewed him   Objective + exam Vitals:   05/13/23 1951 05/13/23 2028 05/13/23 2307 05/14/23 0304  BP: (!) 181/78 (!) 152/89 125/85 (!) 107/47  Pulse: 81 79 72 83  Resp: 13  15 17   Temp: 97.6 F (36.4 C)  98.2 F (36.8 C) 98.2 F (36.8 C)  TempSrc: Axillary  Oral Oral  SpO2: 97%  98% 97%   There were no vitals filed for this visit.  Examination: Incoherent awakens a little bit but falls back asleep Has a  bruise over the left nare S1-S2 no murmur Chest is clear no wheeze Abdomen soft no rebound Cannot  assess motor given lack of cooperation   Data Reviewed: reviewed   CBC    Component Value Date/Time   WBC 6.9 05/14/2023 0438   RBC 4.28 05/14/2023 0438   HGB 11.6 (L) 05/14/2023 0438   HCT 37.0 (L) 05/14/2023 0438   PLT 261 05/14/2023 0438   MCV 86.4 05/14/2023 0438   MCH 27.1 05/14/2023 0438   MCHC 31.4 05/14/2023 0438   RDW 14.5 05/14/2023 0438   LYMPHSABS 1.3 05/13/2023 1239   MONOABS 0.4 05/13/2023 1239   EOSABS 0.0 05/13/2023 1239   BASOSABS 0.0 05/13/2023 1239      Latest Ref Rng & Units 05/14/2023    4:38 AM 05/13/2023    4:53 PM 05/13/2023    1:07 PM  CMP  Glucose 70 - 99 mg/dL 161  096  045   BUN 8 - 23 mg/dL 26  32  45   Creatinine 0.61 - 1.24 mg/dL 4.09  8.11  9.14   Sodium 135 - 145 mmol/L 139  138  138    138   Potassium 3.5 - 5.1 mmol/L 3.9  4.5  4.9    4.9   Chloride 98 - 111 mmol/L 106  104  107   CO2 22 - 32 mmol/L 18  17    Calcium  8.9 - 10.3 mg/dL 8.4  9.0    Total Protein 6.5 - 8.1 g/dL 7.0     Total Bilirubin 0.0 - 1.2 mg/dL 1.1     Alkaline Phos 38 - 126 U/L 46     AST 15 - 41 U/L 21     ALT 0 - 44 U/L 14       Scheduled Meds:  insulin  aspart  0-9 Units Subcutaneous Q4H   insulin  glargine-yfgn  5 Units Subcutaneous QHS   thiamine  (VITAMIN B1) injection  100 mg Intravenous Daily   traZODone   50 mg Oral QHS   Continuous Infusions:  Time  33  Verlie Glisson, MD  [facility]

## 2023-05-14 NOTE — Progress Notes (Signed)
       Overnight   NAME: William Acevedo MRN: [id number] DOB : Sep 21, 1955    Date of Service   05/14/2023   HPI/Events of Note    Notified by Sonia Durand Phys for follow up for Ammonia  and Imaging : Results as follows    Latest Reference Range & Units 05/14/23 19:57  Ammonia  9 - 35 umol/L 15    -------------------------------------- Imaging in part : ""FINDINGS:   BOWEL: The bowel gas pattern is nonspecific. No bowel obstruction.   PERITONEUM AND SOFT TISSUES: No abnormal calcifications.   BONES: No acute osseous abnormality.   IMPRESSION: 1. No evidence of bowel obstruction.   Electronically signed by: Zadie Herter MD 05/14/2023 08:04 PM EDT RP Workstation: [id number]""   Interventions/ Plan   Continue all Attending previous orders       Denece Finger BSN MSNA MSN ACNPC-AG Acute Care Nurse Practitioner [facility]

## 2023-05-14 NOTE — Procedures (Signed)
 Patient Name: William Acevedo  MRN: [id number]  Epilepsy Attending: Arleene Lack  Referring Physician/Provider: Doutova, Anastassia, MD  Date: 05/13/2023 Duration: 26.13 mins  Patient history: 68yo M with ams. EEG to evaluate for seizure  Level of alertness: Awake, asleep  AEDs during EEG study: None  Technical aspects: This EEG study was done with scalp electrodes positioned according to the 10-20 International system of electrode placement. Electrical activity was reviewed with band pass filter of 1-70Hz , sensitivity of 7 uV/mm, display speed of 63mm/sec with a 60Hz  notched filter applied as appropriate. EEG data were recorded continuously and digitally stored.  Video monitoring was available and reviewed as appropriate.  Description: The posterior dominant rhythm consists of 7Hz  activity of moderate voltage (25-35 uV) seen predominantly in posterior head regions, symmetric and reactive to eye opening and eye closing. Sleep was characterized by vertex waves, sleep spindles (12 to 14 Hz), maximal frontocentral region. EEG showed continuous generalized 6 to 7 Hz theta slowing. Hyperventilation and photic stimulation were not performed.     ABNORMALITY - Continuous slow, generalized  IMPRESSION: This study is suggestive of mild to moderate diffuse encephalopathy. No seizures or epileptiform discharges were seen throughout the recording.  Sindi Beckworth O Kiannah Grunow

## 2023-05-15 DIAGNOSIS — T50904A Poisoning by unspecified drugs, medicaments and biological substances, undetermined, initial encounter: Secondary | ICD-10-CM | POA: Diagnosis not present

## 2023-05-15 LAB — BASIC METABOLIC PANEL WITH GFR
Anion gap: 13 (ref 5–15)
BUN: 14 mg/dL (ref 8–23)
CO2: 20 mmol/L — ABNORMAL LOW (ref 22–32)
Calcium: 8.7 mg/dL — ABNORMAL LOW (ref 8.9–10.3)
Chloride: 104 mmol/L (ref 98–111)
Creatinine, Ser: 1.01 mg/dL (ref 0.61–1.24)
GFR, Estimated: >60 mL/min (ref >60)
Glucose, Bld: 148 mg/dL — ABNORMAL HIGH (ref 70–99)
Potassium: 4 mmol/L (ref 3.5–5.1)
Sodium: 137 mmol/L (ref 135–145)

## 2023-05-15 LAB — CBC WITH DIFFERENTIAL/PLATELET
Abs Immature Granulocytes: 0.01 10*3/uL (ref 0.00–0.07)
Basophils Absolute: 0 10*3/uL (ref 0.0–0.1)
Basophils Relative: 0 %
Eosinophils Absolute: 0 10*3/uL (ref 0.0–0.5)
Eosinophils Relative: 0 %
HCT: 40 % (ref 39.0–52.0)
Hemoglobin: 12.5 g/dL — ABNORMAL LOW (ref 13.0–17.0)
Immature Granulocytes: 0 %
Lymphocytes Relative: 22 %
Lymphs Abs: 1.8 10*3/uL (ref 0.7–4.0)
MCH: 27.1 pg (ref 26.0–34.0)
MCHC: 31.3 g/dL (ref 30.0–36.0)
MCV: 86.8 fL (ref 80.0–100.0)
Monocytes Absolute: 0.7 10*3/uL (ref 0.1–1.0)
Monocytes Relative: 9 %
Neutro Abs: 5.3 10*3/uL (ref 1.7–7.7)
Neutrophils Relative %: 69 %
Platelets: 296 10*3/uL (ref 150–400)
RBC: 4.61 MIL/uL (ref 4.22–5.81)
RDW: 14.2 % (ref 11.5–15.5)
WBC: 7.8 10*3/uL (ref 4.0–10.5)
nRBC: 0 % (ref 0.0–0.2)

## 2023-05-15 LAB — GLUCOSE, CAPILLARY
Glucose-Capillary: 168 mg/dL — ABNORMAL HIGH (ref 70–99)
Glucose-Capillary: 142 mg/dL — ABNORMAL HIGH (ref 70–99)
Glucose-Capillary: 188 mg/dL — ABNORMAL HIGH (ref 70–99)
Glucose-Capillary: 163 mg/dL — ABNORMAL HIGH (ref 70–99)
Glucose-Capillary: 185 mg/dL — ABNORMAL HIGH (ref 70–99)
Glucose-Capillary: 175 mg/dL — ABNORMAL HIGH (ref 70–99)

## 2023-05-15 LAB — VITAMIN B12: Vitamin B-12: 2041 pg/mL — ABNORMAL HIGH (ref 180–914)

## 2023-05-15 MED ORDER — THIAMINE MONONITRATE 100 MG PO TABS
100.0000 mg | ORAL_TABLET | Freq: Every day | ORAL | Status: DC
  Administered 2023-05-15 – 2023-05-20 (×6): 100 mg via ORAL
  Filled 2023-05-15 (×6): qty 1

## 2023-05-15 NOTE — Progress Notes (Addendum)
 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--recurrent 06/2020 subsequently as well as 02/2022 went to [facility] Known PFO mitral valve degeneration/endocarditis 2022 Very poorly controlled DM TY 2 Most recent hospitalization 07/16/2022 through 07/19/2022 acute metabolic encephalopathy in the setting of polypharmacy-at that time found to have right renal infarct polypharmacy was diminished and Zyprexa  was held Foley catheter was placed he was also also have multiple pulmonary nodules requiring outpatient follow-up  4/26 re-presented with altered mental status GCS 5 received Narcan -able to protect airway GCS increased to 9 In ER workup after giving Narcan  he admitted to using cocaine recently UDS positive Sodium 139 potassium 4.6 CO2 17 BUN/creatinine 36/1.3 (above baseline 17/1.0 )LFTs normal CPK 543 troponin 20 osmolality 313 lactic acid 1.3 CBC 7.3 hemoglobin 13.6 platelet 250 CT cervical spine degenerative anterolisthesis C3-C4 + C7-T1 no acute intracranial cervical spine injury or intracranial damage with advanced ischemic chronic injury and brain CXR no acute pulmonary disease right brain no acute intracranial abnormality stable remote left ACA right parietal cortical infarcts EEG performed showing mild to moderate diffuse encephalopathy   Plan  Toxic metabolic encephalopathy in the setting of cocaine use disorder--ammonia  only 15-doubt hyperammonemia Previous B12 06/2022 was 1700 and actually higher-previous RPR was nonreactive 02/2022 HIV this admission is negative Would repeat B12 See discussion with  brother below  Cocaine relapse recently Brother acknowledges that he has relapsed with cocaine as of 4/1:24 and half years of sobriety  Mild rhabdomyolysis, mild metabolic acidosis Improving well on IVF--NSL 4/27 PM--- expect bicarb will improve with regular diet  AKI on admission 2/2?  Rhabdo from either cocaine or being down-NSL as above Check labs periodically  Elevated troponin on admission Likely secondary to renal insufficiency no overt chest pain no other real concerns so would hold any further workup Resume at least aspirin  81 at discharge  2-3 separate episodes of embolic CVA Not on aspirin  nor Plavix  at this time -reason unclear --when more awake resume Lipitor , Zetia  resume as outpatient will inquire  Known PFO?  Endocarditis/differential mitral valve degeneration Resume Toprol -XL 50 when more awake  diabetes mellitus-A1c 8.6 CBG 112-160-currently on Lantus  5, adjust SSI to 4 times daily AC  Previous right renal infarct requiring Foley Kidney function improving no need for Foley  DVT prophylaxis: SCD  Status is: Observation The patient will require care spanning > 2 midnights and should be moved to inpatient because:   Discussed with the patient bro--speech is slow at baseline as he has had several strokes.  He has some days where he is weak--uses walker vs cane at home.  Brother helps manage him at home--preferences for home with home health as he does get therapy 3 times a week-I will asked therapy to see      Subjective:  Still drowsy.  Awake enough to get OOB with nursing today eating some At baseline it seems that he is typically drowsy No pain no fever no nausea no vomiting   Objective + exam Vitals:   05/14/23 2329 05/14/23 2350 05/15/23 0338 05/15/23 0800  BP: (!) 179/87 (!) 143/55 (!) 146/73 (!) 149/74  Pulse:  73 78 76  Resp:  17 19 12   Temp: 97.7 F (36.5 C)  97.6 F (36.4 C) 98.4 F (36.9 C)  TempSrc:   Oral Oral  SpO2:  96% 96% 99%    There were no vitals filed for this visit.  Examination:  Slow speech more alert coherent EOMI NCAT no focal deficit Cannot clearly do finger-nose-finger Moving lower extremities but confused No lower extremity edema Left nare has bruising S1-S2 no murmur Telemetry sinus rhythm   Data Reviewed: reviewed   CBC    Component Value Date/Time   WBC 7.8 05/15/2023 0837   RBC 4.61 05/15/2023 0837   HGB 12.5 (L) 05/15/2023 0837   HCT 40.0 05/15/2023 0837   PLT 296 05/15/2023 0837   MCV 86.8 05/15/2023 0837   MCH 27.1 05/15/2023 0837   MCHC 31.3 05/15/2023 0837   RDW 14.2 05/15/2023 0837   LYMPHSABS 1.8 05/15/2023 0837   MONOABS 0.7 05/15/2023 0837   EOSABS 0.0 05/15/2023 0837   BASOSABS 0.0 05/15/2023 0837      Latest Ref Rng & Units 05/15/2023    6:18 AM 05/14/2023    4:38 AM 05/13/2023    4:53 PM  CMP  Glucose 70 - 99 mg/dL 160  737  106   BUN 8 - 23 mg/dL 14  26  32   Creatinine 0.61 - 1.24 mg/dL 2.69  4.85  4.62   Sodium 135 - 145 mmol/L 137  139  138   Potassium 3.5 - 5.1 mmol/L 4.0  3.9  4.5   Chloride 98 - 111 mmol/L 104  106  104   CO2 22 - 32 mmol/L 20  18  17    Calcium  8.9 - 10.3 mg/dL 8.7  8.4  9.0   Total Protein 6.5 - 8.1 g/dL  7.0    Total Bilirubin 0.0 - 1.2 mg/dL  1.1    Alkaline Phos 38 - 126 U/L  46    AST 15 - 41 U/L  21    ALT 0 - 44 U/L  14      Scheduled Meds:  famotidine   20 mg Oral QAC breakfast   insulin  aspart  4 Units Subcutaneous TID WC   insulin  glargine-yfgn  5 Units Subcutaneous QHS   metoprolol  succinate  50 mg Oral Daily   thiamine   100 mg Oral Daily   traZODone   50 mg Oral QHS   Continuous Infusions:  Time  33  William Genecis Veley, MD  [facility]

## 2023-05-15 NOTE — Progress Notes (Signed)
 Spoke with patient's son, [name], at patient's bedside. [name] states patient lives with his brother [name] and that he also has a caretaker that comes to home when William Acevedo is unavailable so that patient has someone with 24 hrs/day. [name] also states that at baseline, patient is able to feed himself and ambulate with assistance of a cane.

## 2023-05-15 NOTE — Plan of Care (Signed)
  Problem: Education: Goal: Ability to describe self-care measures that may prevent or decrease complications (Diabetes Survival Skills Education) will improve Outcome: Progressing Goal: Individualized Educational Video(s) Outcome: Progressing   Problem: Coping: Goal: Ability to adjust to condition or change in health will improve Outcome: Progressing   Problem: Fluid Volume: Goal: Ability to maintain a balanced intake and output will improve Outcome: Progressing   Problem: Health Behavior/Discharge Planning: Goal: Ability to identify and utilize available resources and services will improve Outcome: Progressing Goal: Ability to manage health-related needs will improve Outcome: Progressing   Problem: Metabolic: Goal: Ability to maintain appropriate glucose levels will improve Outcome: Progressing   Problem: Nutritional: Goal: Maintenance of adequate nutrition will improve Outcome: Progressing Goal: Progress toward achieving an optimal weight will improve Outcome: Progressing   Problem: Skin Integrity: Goal: Risk for impaired skin integrity will decrease Outcome: Progressing   Problem: Tissue Perfusion: Goal: Adequacy of tissue perfusion will improve Outcome: Progressing   Problem: Health Behavior/Discharge Planning: Goal: Ability to manage health-related needs will improve Outcome: Progressing   Problem: Clinical Measurements: Goal: Ability to maintain clinical measurements within normal limits will improve Outcome: Progressing Goal: Will remain free from infection Outcome: Progressing Goal: Diagnostic test results will improve Outcome: Progressing Goal: Respiratory complications will improve Outcome: Progressing Goal: Cardiovascular complication will be avoided Outcome: Progressing   Problem: Activity: Goal: Risk for activity intolerance will decrease Outcome: Progressing   Problem: Nutrition: Goal: Adequate nutrition will be maintained Outcome:  Progressing   Problem: Coping: Goal: Level of anxiety will decrease Outcome: Progressing   Problem: Elimination: Goal: Will not experience complications related to bowel motility Outcome: Progressing Goal: Will not experience complications related to urinary retention Outcome: Progressing   Problem: Pain Managment: Goal: General experience of comfort will improve and/or be controlled Outcome: Progressing   Problem: Safety: Goal: Ability to remain free from injury will improve Outcome: Progressing   Problem: Skin Integrity: Goal: Risk for impaired skin integrity will decrease Outcome: Progressing

## 2023-05-15 NOTE — Evaluation (Signed)
 Physical Therapy Evaluation Patient Details Name: William Acevedo MRN: [id number] DOB: 05-Dec-1955 Today's Date: 05/15/2023  History of Present Illness  The pt is a 68 yo male presenting 4/26 after falling out of a chair at a bus stop. Pt initially unresponsive, improved with narcan . UDS + cocaine. Admitted for management of AMS in setting of polysubstance use. Work up also revealed: mild rhabdomyolysis and AKI. PMH includes: CVA of L frontal lobe, L ACA territory, bilateral occipital cortex, bilateral PCA territory, bilateral cerebellar infarcrs, HTN, HLD, uncontrolled DM II, renal infarct, major depressive disorder, schizophrenia, and polysubstance abuse (cocaine and marijuana).   Clinical Impression  Pt in bed upon arrival of PT, agreeable to evaluation at this time. No family present and pt is an unreliable historian, demos poor memory consistently through session, has significant difficulty with open-ended questions, and often repeats last option given by PT as his answer when given choices. The pt was able to follow commands with increased time, but required frequent cues for technique, and significant assistance to complete bed mobility, movement of LE, trunk, and to manage standing balance. The pt required assist to manage wt shift for lateral stepping and demos poor stability on RLE and limited ability to advance his RLE as well. Pt's deficits make him at increased risk of falls, on top of pt's reports of falling "more often than you want to know" at home prior to admission. Pt inconsistent with reports of assistance at home, will benefit from post-acute rehab <3hours/day to facilitate return to prior level of independence and maximal safety with return home. Per RN, pt also with significant difficulty feeding himself, recommend OT consult.         If plan is discharge home, recommend the following: Two people to help with walking and/or transfers;A lot of help with  bathing/dressing/bathroom;Assistance with cooking/housework;Assistance with feeding;Direct supervision/assist for medications management;Direct supervision/assist for financial management;Assist for transportation;Help with stairs or ramp for entrance;Supervision due to cognitive status   Can travel by private vehicle   No    Equipment Recommendations Other (comment) (defer until gait training completed)  Recommendations for Other Services  OT consult    Functional Status Assessment Patient has had a recent decline in their functional status and demonstrates the ability to make significant improvements in function in a reasonable and predictable amount of time.     Precautions / Restrictions Precautions Precautions: Fall Recall of Precautions/Restrictions: Impaired Precaution/Restrictions Comments: falls often Restrictions Weight Bearing Restrictions Per Provider Order: No      Mobility  Bed Mobility Overal bed mobility: Needs Assistance Bed Mobility: Rolling, Sidelying to Sit, Sit to Sidelying Rolling: Min assist Sidelying to sit: Mod assist     Sit to sidelying: Mod assist General bed mobility comments: modA to elevate trunk and move LE to EOB, modA to lift LE back into bed    Transfers Overall transfer level: Needs assistance Equipment used: 1 person hand held assist Transfers: Sit to/from Stand Sit to Stand: Mod assist           General transfer comment: modA to stand and steady, no overt buckling wiht static stance    Ambulation/Gait Ambulation/Gait assistance: Mod assist Gait Distance (Feet): 3 Feet Assistive device: 1 person hand held assist Gait Pattern/deviations: Step-to pattern Gait velocity: decreased     General Gait Details: limited advancement with RLE, buckling/instability in RLE with advancement of L. assist to shift wt  Careers information officer  Tilt Bed    Modified Rankin (Stroke Patients Only) Modified Rankin  (Stroke Patients Only) Pre-Morbid Rankin Score: Moderately severe disability Modified Rankin: Moderately severe disability     Balance Overall balance assessment: Needs assistance, History of Falls Sitting-balance support: Single extremity supported, Feet supported Sitting balance-Leahy Scale: Fair   Postural control: Posterior lean Standing balance support: Single extremity supported, During functional activity Standing balance-Leahy Scale: Poor Standing balance comment: single UE and modA to maintain balance with mobility                             Pertinent Vitals/Pain Pain Assessment Pain Assessment: No/denies pain    Home Living Family/patient expects to be discharged to:: Private residence Living Arrangements: Other relatives Available Help at Discharge: Family;Available 24 hours/day Type of Home: House Home Access: Ramped entrance       Home Layout: One level Home Equipment: BSC/3in1;Shower seat;Cane - single point;Crutches;Wheelchair - manual Additional Comments: pt is poor historian, information from last admission in 2024, no family present to confirm.    Prior Function Prior Level of Function : Needs assist       Physical Assist : Mobility (physical);ADLs (physical) Mobility (physical): Transfers;Gait ADLs (physical): Bathing;Dressing;IADLs;Grooming Mobility Comments: pt reports use of RW, has his personal cane in the room.reports falling very often and needing help up ADLs Comments: pt reports needing assist from his brother     Extremity/Trunk Assessment   Upper Extremity Assessment Upper Extremity Assessment: Right hand dominant;Generalized weakness;RUE deficits/detail;LUE deficits/detail RUE Deficits / Details: hx of CVA with contraction and coordination deficits. pt unable to open hand, did demo good grip strength. RUE Coordination: decreased fine motor;decreased gross motor LUE Deficits / Details: hx of CVA with coordination deficits,  grip strength WFL and pt using to assist with mobility LUE Coordination: decreased fine motor;decreased gross motor    Lower Extremity Assessment Lower Extremity Assessment: Generalized weakness;RLE deficits/detail;LLE deficits/detail RLE Deficits / Details: grossly 4-/5 to MMT, but limited ROM and poor functional use. impaired coordination RLE Coordination: decreased fine motor;decreased gross motor LLE Deficits / Details: grossly 4/5 to MMT, but limited ROM and poor functional use. impaired coordination LLE Coordination: decreased fine motor;decreased gross motor    Cervical / Trunk Assessment Cervical / Trunk Assessment: Kyphotic  Communication   Communication Communication: Impaired Factors Affecting Communication: Difficulty expressing self    Cognition Arousal: Alert Behavior During Therapy: Flat affect   PT - Cognitive impairments: No family/caregiver present to determine baseline, Orientation, Awareness, Memory, Attention, Initiation, Sequencing, Problem solving, Safety/Judgement   Orientation impairments: Place, Time, Situation                   PT - Cognition Comments: pt seems to repeat last option given by PT, often not answering open-ended questions and answering differently when wording changed. the pt was able to follow commands with increased time, was unable to recall month or year despite being told earlier in day and in session Following commands: Impaired Following commands impaired: Follows one step commands inconsistently, Follows one step commands with increased time     Cueing Cueing Techniques: Verbal cues, Gestural cues, Tactile cues     General Comments General comments (skin integrity, edema, etc.): VSS, pt soiled and RN present to assist wtih cleaning and changing bed sheets    Exercises     Assessment/Plan    PT Assessment Patient needs continued PT services  PT Problem List Decreased strength;Decreased range of  motion;Decreased  activity tolerance;Decreased balance;Decreased mobility;Decreased coordination;Decreased cognition;Decreased knowledge of use of DME;Decreased safety awareness;Impaired tone       PT Treatment Interventions DME instruction;Gait training;Stair training;Functional mobility training;Therapeutic exercise;Therapeutic activities;Balance training;Neuromuscular re-education;Patient/family education    PT Goals (Current goals can be found in the Care Plan section)  Acute Rehab PT Goals Patient Stated Goal: return home PT Goal Formulation: With patient Time For Goal Achievement: 05/29/23 Potential to Achieve Goals: Good    Frequency Min 2X/week        AM-PAC PT "6 Clicks" Mobility  Outcome Measure Help needed turning from your back to your side while in a flat bed without using bedrails?: A Lot Help needed moving from lying on your back to sitting on the side of a flat bed without using bedrails?: A Lot Help needed moving to and from a bed to a chair (including a wheelchair)?: A Lot Help needed standing up from a chair using your arms (e.g., wheelchair or bedside chair)?: A Lot Help needed to walk in hospital room?: Total Help needed climbing 3-5 steps with a railing? : Total 6 Click Score: 10    End of Session Equipment Utilized During Treatment: Gait belt Activity Tolerance: Patient tolerated treatment well Patient left: in bed;with call bell/phone within reach;with bed alarm set Nurse Communication: Mobility status PT Visit Diagnosis: Unsteadiness on feet (R26.81);Other abnormalities of gait and mobility (R26.89);Repeated falls (R29.6);Muscle weakness (generalized) (M62.81);Hemiplegia and hemiparesis Hemiplegia - Right/Left: Right Hemiplegia - dominant/non-dominant: Dominant Hemiplegia - caused by:  (chronic)    Time: 2725-3664 PT Time Calculation (min) (ACUTE ONLY): 27 min   Charges:   PT Evaluation $PT Eval Moderate Complexity: 1 Mod PT Treatments $Therapeutic Activity: 8-22  mins PT General Charges $$ ACUTE PT VISIT: 1 Visit         Barnabas Booth, PT, DPT   Acute Rehabilitation Department Office [phone] Secure Chat Communication Preferred  Lona Rist 05/15/2023, 2:57 PM

## 2023-05-16 DIAGNOSIS — T50904A Poisoning by unspecified drugs, medicaments and biological substances, undetermined, initial encounter: Secondary | ICD-10-CM | POA: Diagnosis not present

## 2023-05-16 LAB — CBC WITH DIFFERENTIAL/PLATELET
Abs Immature Granulocytes: 0.02 10*3/uL (ref 0.00–0.07)
Basophils Absolute: 0 10*3/uL (ref 0.0–0.1)
Basophils Relative: 0 %
Eosinophils Absolute: 0 10*3/uL (ref 0.0–0.5)
Eosinophils Relative: 1 %
HCT: 41.3 % (ref 39.0–52.0)
Hemoglobin: 13.2 g/dL (ref 13.0–17.0)
Immature Granulocytes: 0 %
Lymphocytes Relative: 24 %
Lymphs Abs: 1.5 10*3/uL (ref 0.7–4.0)
MCH: 27 pg (ref 26.0–34.0)
MCHC: 32 g/dL (ref 30.0–36.0)
MCV: 84.6 fL (ref 80.0–100.0)
Monocytes Absolute: 0.7 10*3/uL (ref 0.1–1.0)
Monocytes Relative: 11 %
Neutro Abs: 4.1 10*3/uL (ref 1.7–7.7)
Neutrophils Relative %: 64 %
Platelets: 280 10*3/uL (ref 150–400)
RBC: 4.88 MIL/uL (ref 4.22–5.81)
RDW: 14 % (ref 11.5–15.5)
WBC: 6.3 10*3/uL (ref 4.0–10.5)
nRBC: 0 % (ref 0.0–0.2)

## 2023-05-16 LAB — BASIC METABOLIC PANEL WITH GFR
Anion gap: 10 (ref 5–15)
BUN: 10 mg/dL (ref 8–23)
CO2: 24 mmol/L (ref 22–32)
Calcium: 8.8 mg/dL — ABNORMAL LOW (ref 8.9–10.3)
Chloride: 101 mmol/L (ref 98–111)
Creatinine, Ser: 0.98 mg/dL (ref 0.61–1.24)
GFR, Estimated: >60 mL/min (ref >60)
Glucose, Bld: 171 mg/dL — ABNORMAL HIGH (ref 70–99)
Potassium: 4.2 mmol/L (ref 3.5–5.1)
Sodium: 135 mmol/L (ref 135–145)

## 2023-05-16 LAB — GLUCOSE, CAPILLARY
Glucose-Capillary: 155 mg/dL — ABNORMAL HIGH (ref 70–99)
Glucose-Capillary: 161 mg/dL — ABNORMAL HIGH (ref 70–99)
Glucose-Capillary: 170 mg/dL — ABNORMAL HIGH (ref 70–99)
Glucose-Capillary: 206 mg/dL — ABNORMAL HIGH (ref 70–99)
Glucose-Capillary: 251 mg/dL — ABNORMAL HIGH (ref 70–99)
Glucose-Capillary: 256 mg/dL — ABNORMAL HIGH (ref 70–99)

## 2023-05-16 MED ORDER — ASPIRIN 81 MG PO TBEC
81.0000 mg | DELAYED_RELEASE_TABLET | Freq: Every day | ORAL | Status: DC
  Administered 2023-05-16 – 2023-05-20 (×5): 81 mg via ORAL
  Filled 2023-05-16 (×5): qty 1

## 2023-05-16 NOTE — Progress Notes (Addendum)
 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--recurrent 06/2020 subsequently as well as 02/2022 went to [facility] Known PFO mitral valve degeneration/endocarditis 2022 Very poorly controlled DM TY 2 Most recent hospitalization 07/16/2022 through 07/19/2022 acute metabolic encephalopathy in the setting of polypharmacy-at that time found to have right renal infarct polypharmacy was diminished and Zyprexa  was held Foley catheter was placed he was also also have multiple pulmonary nodules requiring outpatient follow-up  4/26 re-presented with altered mental status GCS 5 received Narcan -able to protect airway GCS increased to 9 In ER workup after giving Narcan  he admitted to using cocaine recently UDS positive Sodium 139 potassium 4.6 CO2 17 BUN/creatinine 36/1.3 (above baseline 17/1.0 )LFTs normal CPK 543 troponin 20 osmolality 313 lactic acid 1.3 CBC 7.3 hemoglobin 13.6 platelet 250 CT cervical spine degenerative anterolisthesis C3-C4 + C7-T1 no acute intracranial cervical spine injury or intracranial damage with advanced ischemic chronic injury and brain CXR no acute pulmonary disease right brain no acute intracranial abnormality stable remote left ACA right parietal cortical infarcts EEG performed showing mild to moderate diffuse encephalopathy   Plan  Toxic metabolic encephalopathy in the setting of cocaine use disorder--ammonia  only 15-doubt hyperammonemia Previous B12 06/2022 was 1700 and actually higher-previous RPR was nonreactive 02/2022 HIV this admission is negative B12 is within normal limits  Cocaine  relapse recently Brother acknowledges --- relapsed with cocaine as of 4/24   after 1.5 years of sobriety--- unclear if he will durably quit  Mild rhabdomyolysis, mild metabolic acidosis Improving well on IVF--NSL 4/27 PM--- bicarb improved as expected  AKI on admission 2/2?  Rhabdo from either cocaine or being down-NSL as above Check labs periodically-no further labs today and would recheck periodically every 2 to 3 days  Elevated troponin on admission Likely secondary to renal insufficiency no overt chest pain no other real concerns so would hold any further workup Resume at least aspirin  81 at discharge  2-3 separate episodes of embolic CVA Not on aspirin  nor Plavix  at this time -reason unclear --when more awake resume Lipitor , Zetia  resume as outpatient will inquire  Known PFO?  Endocarditis/differential mitral valve degeneration Resume Toprol -XL 50  -Normal-May need to add meds in the next 24 to 48 hours  diabetes mellitus-A1c 8.6 CBG  160-200-currently on Lantus  5, adjust SSI to 4 times daily AC  Previous right renal infarct requiring Foley Kidney function improving no need for Foley  DVT prophylaxis: SCD  Status is: Observation The patient will require care spanning > 2 midnights and should be moved to inpatient because:   Discussed with the patient bro--speech is slow at baseline as he has had several strokes.  He has some days where he is weak--uses walker vs cane at home.    called brother  Royston Cornea [phone] again on 4/29-as could not reach power of attorney [name]- [phone]--[facility] skilled placement   Subjective:    drowsy but redirectable some still somewhat weaker on one side Nursing reports needing a lot of assistance getting up  Objective + exam Vitals:   05/15/23 2325 05/16/23 0350 05/16/23 0735 05/16/23 1131  BP: (!) 158/84 (!) 141/74 133/62 (!) 169/87  Pulse:  72 73 72 76  Resp: 17 13 16 19   Temp: 98.3 F (36.8 C) 98.1 F (36.7 C) 98.3 F (36.8 C)  98.3 F (36.8 C)  TempSrc: Oral Axillary Oral Oral  SpO2: 98% 97% 97% 97%   There were no vitals filed for this visit.  Examination:  Slow speech more alert coherent EOMI NCAT no focal deficit  chest clear no wheeze rales rhonchi Abdominal soft no rebound Some bruising to left nare   Data Reviewed: reviewed   CBC    Component Value Date/Time   WBC 6.3 05/16/2023 0539   RBC 4.88 05/16/2023 0539   HGB 13.2 05/16/2023 0539   HCT 41.3 05/16/2023 0539   PLT 280 05/16/2023 0539   MCV 84.6 05/16/2023 0539   MCH 27.0 05/16/2023 0539   MCHC 32.0 05/16/2023 0539   RDW 14.0 05/16/2023 0539   LYMPHSABS 1.5 05/16/2023 0539   MONOABS 0.7 05/16/2023 0539   EOSABS 0.0 05/16/2023 0539   BASOSABS 0.0 05/16/2023 0539      Latest Ref Rng & Units 05/16/2023    5:39 AM 05/15/2023    6:18 AM 05/14/2023    4:38 AM  CMP  Glucose 70 - 99 mg/dL 578  469  629   BUN 8 - 23 mg/dL 10  14  26    Creatinine 0.61 - 1.24 mg/dL 5.28  4.13  2.44   Sodium 135 - 145 mmol/L 135  137  139   Potassium 3.5 - 5.1 mmol/L 4.2  4.0  3.9   Chloride 98 - 111 mmol/L 101  104  106   CO2 22 - 32 mmol/L 24  20  18    Calcium  8.9 - 10.3 mg/dL 8.8  8.7  8.4   Total Protein 6.5 - 8.1 g/dL   7.0   Total Bilirubin 0.0 - 1.2 mg/dL   1.1   Alkaline Phos 38 - 126 U/L   46   AST 15 - 41 U/L   21   ALT 0 - 44 U/L   14     Scheduled Meds:  famotidine   20 mg Oral QAC breakfast   insulin  aspart  4 Units Subcutaneous TID WC   insulin  glargine-yfgn  5 Units Subcutaneous QHS   metoprolol  succinate  50 mg Oral Daily   thiamine   100 mg Oral Daily   traZODone   50 mg Oral QHS   Continuous Infusions:  Time  33  Jai-Gurmukh Cariana Karge, MD  [facility]

## 2023-05-16 NOTE — Evaluation (Signed)
 Occupational Therapy Evaluation Patient Details Name: William Acevedo MRN: [id number] DOB: 1955-06-29 Today's Date: 05/16/2023   History of Present Illness   The pt is a 68 yo male presenting 4/26 after falling out of a chair at a bus stop. Pt initially unresponsive, improved with narcan . UDS + cocaine. Admitted for management of AMS in setting of polysubstance use. Work up also revealed: mild rhabdomyolysis and AKI. PMH includes: CVA of L frontal lobe, L ACA territory, bilateral occipital cortex, bilateral PCA territory, bilateral cerebellar infarcrs, HTN, HLD, uncontrolled DM II, renal infarct, major depressive disorder, schizophrenia, and polysubstance abuse (cocaine and marijuana).     Clinical Impressions Patient sleeping soundly, unable to maintain LOA, falling back to sleep quickly.  Patient unable to safely sit EOB without constant balance support, patient attempting to lie back down, and had been up in the recliner earlier in the day.  Patient returned to supine and lap restraint reapplied.  Unsure what baseline for the patient is, per the chart, he needed 24 hour assist as needed.  SNF has been recommended for post acute rehab.  MD to contact brother to determine PLOF and post acute need.  OT will follow in the acute setting to encourage mobility.       If plan is discharge home, recommend the following:   A lot of help with bathing/dressing/bathroom;A lot of help with walking and/or transfers;Assistance with feeding;Assistance with cooking/housework;Supervision due to cognitive status;Direct supervision/assist for financial management;Assist for transportation;Help with stairs or ramp for entrance;Direct supervision/assist for medications management     Functional Status Assessment   Patient has had a recent decline in their functional status and demonstrates the ability to make significant improvements in function in a reasonable and predictable amount of time.      Equipment Recommendations   None recommended by OT     Recommendations for Other Services         Precautions/Restrictions   Precautions Precautions: Fall Recall of Precautions/Restrictions: Impaired Restrictions Weight Bearing Restrictions Per Provider Order: No     Mobility Bed Mobility   Bed Mobility: Sidelying to Sit, Sit to Sidelying   Sidelying to sit: Mod assist, Max assist     Sit to sidelying: Max assist      Transfers                          Balance Overall balance assessment: Needs assistance, History of Falls Sitting-balance support: Single extremity supported, Feet supported Sitting balance-Leahy Scale: Poor   Postural control: Posterior lean, Right lateral lean                                 ADL either performed or assessed with clinical judgement   ADL Overall ADL's : Needs assistance/impaired     Grooming: Wash/dry hands;Wash/dry face;Moderate assistance;Bed level   Upper Body Bathing: Maximal assistance;Bed level   Lower Body Bathing: Maximal assistance;Bed level   Upper Body Dressing : Moderate assistance;Sitting;Maximal assistance   Lower Body Dressing: Maximal assistance;Bed level   Toilet Transfer: Maximal assistance;Stand-pivot;BSC/3in1   Toileting- Clothing Manipulation and Hygiene: Total assistance;Sit to/from stand               Vision   Vision Assessment?: No apparent visual deficits     Perception Perception: Not tested       Praxis Praxis: Not tested       Pertinent Vitals/Pain  Pain Assessment Pain Assessment: Faces Faces Pain Scale: No hurt Pain Intervention(s): Monitored during session, Limited activity within patient's tolerance     Extremity/Trunk Assessment Upper Extremity Assessment RUE Deficits / Details: hx of CVA with contraction and coordination deficits. pt unable to open hand, did demo good grip strength.  Moving UE to brace self during bed mobility. LUE  Deficits / Details: hx of CVA with coordination deficits, grip strength WFL and pt using to assist with mobility.  Moving UE to brace himeself during bed mobility.   Lower Extremity Assessment Lower Extremity Assessment: Defer to PT evaluation   Cervical / Trunk Assessment Cervical / Trunk Assessment: Kyphotic   Communication Communication Communication: Impaired Factors Affecting Communication: Difficulty expressing self   Cognition Arousal: Lethargic Behavior During Therapy: Flat affect Cognition: No family/caregiver present to determine baseline                               Following commands: Impaired Following commands impaired: Follows one step commands inconsistently     Cueing  General Comments   Cueing Techniques: Verbal cues;Gestural cues;Tactile cues      Exercises     Shoulder Instructions      Home Living Family/patient expects to be discharged to:: Private residence Living Arrangements: Other relatives Available Help at Discharge: Family;Available 24 hours/day Type of Home: House Home Access: Ramped entrance     Home Layout: One level     Bathroom Shower/Tub: Chief Strategy Officer: Handicapped height Bathroom Accessibility: Yes How Accessible: Accessible via walker Home Equipment: BSC/3in1;Shower seat;Cane - single point;Crutches;Wheelchair - manual   Additional Comments: Taken from chart - patient unable to maintain LOA or answer anything except "yes"      Prior Functioning/Environment Prior Level of Function : Needs assist         Mobility (physical): Transfers;Gait ADLs (physical): Bathing;Dressing;IADLs;Grooming   ADLs Comments: CM note reports needing assist from his brother and also has a PCA when the brother is not there.    OT Problem List: Impaired balance (sitting and/or standing);Decreased safety awareness;Decreased cognition   OT Treatment/Interventions: Self-care/ADL training;Therapeutic  activities;Patient/family education;Balance training      OT Goals(Current goals can be found in the care plan section)   Acute Rehab OT Goals OT Goal Formulation: Patient unable to participate in goal setting Time For Goal Achievement: 05/30/23 Potential to Achieve Goals: Fair ADL Goals Pt Will Perform Grooming: with set-up;sitting Pt Will Perform Upper Body Dressing: with min assist;sitting Pt Will Perform Lower Body Dressing: with mod assist;sit to/from stand Pt Will Transfer to Toilet: with min assist;stand pivot transfer;bedside commode   OT Frequency:  Min 1X/week    Co-evaluation              AM-PAC OT "6 Clicks" Daily Activity     Outcome Measure Help from another person eating meals?: A Lot Help from another person taking care of personal grooming?: A Lot Help from another person toileting, which includes using toliet, bedpan, or urinal?: Total Help from another person bathing (including washing, rinsing, drying)?: A Lot Help from another person to put on and taking off regular upper body clothing?: A Lot Help from another person to put on and taking off regular lower body clothing?: Total 6 Click Score: 10   End of Session Nurse Communication: Mobility status  Activity Tolerance: Patient limited by lethargy Patient left: in bed;with call bell/phone within reach;with bed alarm set;with restraints  reapplied  OT Visit Diagnosis: Unsteadiness on feet (R26.81);Other symptoms and signs involving cognitive function                Time: 1300-1321 OT Time Calculation (min): 21 min Charges:  OT General Charges $OT Visit: 1 Visit OT Evaluation $OT Eval Moderate Complexity: 1 Mod  05/16/2023  RP, OTR/L  Acute Rehabilitation Services  Office:  [phone]   William Acevedo 05/16/2023, 2:22 PM

## 2023-05-16 NOTE — TOC Progression Note (Signed)
 Transition of Care [facility]) - Progression Note    Patient Details  Name: William Acevedo MRN: [id number] Date of Birth: 01/19/1955  [facility]) CM/SW Contact  Valery Gaucher, [place] Phone Number: 05/16/2023, 3:12 PM  Clinical Narrative:     Received call back from [place]-  Patient is not service connected  PCP at [facility] Dr. Jenell Mirza SW [name] [phone] ext [phone] Patient will have to use his medicare coverage [facility]  subscriber # [id number] group[ # [phone]  TOC will provide bed offers once available   Liddie Reel, MSW, LCSW Clinical Social Worker    Expected Discharge Plan: Skilled Nursing Facility Barriers to Discharge: English as a second language teacher, Continued Medical Work up, SNF Pending bed offer  Expected Discharge Plan and Services In-house Referral: Clinical Social Work     Living arrangements for the past 2 months: Single Family Home                                       Social Determinants of Health (SDOH) Interventions SDOH Screenings   Food Insecurity: Patient Unable To Answer (05/13/2023)  Housing: Patient Unable To Answer (05/13/2023)  Transportation Needs: Patient Unable To Answer (05/13/2023)  Utilities: Patient Unable To Answer (05/13/2023)  Social Connections: Patient Unable To Answer (05/13/2023)  Tobacco Use: High Risk (04/17/2023)    Readmission Risk Interventions     No data to display

## 2023-05-16 NOTE — TOC Initial Note (Addendum)
 Transition of Care [facility]) - Initial/Assessment Note    Patient Details  Name: William Acevedo MRN: [id number] Date of Birth: 1955-08-08  Transition of Care [facility]) CM/SW Contact:    Valery Gaucher, LCSW Phone Number: 05/16/2023, 1:20 PM  Clinical Narrative:                  Per chart review patient is AX2. CSW visit w/patient, his caregiver was present. Patient was in/out of sleep.  Patient receives 6 hrs of care 7 days per week, per caregiver.   CSW contacted patient's brother,William Acevedo. He confirms the patient lives in the home with him. CSW discussed recommendation of short term rehab at [facility]. He agrees with the patient going to rehab before returning home. He states the patient has VA and Medicare benefits- CSW explained VA verses Medicare, Patient's brother agrees to use either benefit to help transition patient to next level of care once stable. CSW explained substance abuse is a potential  barrier. He states understanding.  Called [facility]- left voice message w/ April to determine if the patient is service connected- waiting on call back.  CSW acknowledges consult for substance abuse counseling/education. Patient is not oriented, TOC will address consult at more appropriate time.   TOC will continue to follow and assist with discharge planning.   Liddie Reel, MSW, LCSW Clinical Social Worker    Expected Discharge Plan: Skilled Nursing Facility Barriers to Discharge: English as a second language teacher, Continued Medical Work up, SNF Pending bed offer   Patient Goals and CMS Choice            Expected Discharge Plan and Services In-house Referral: Clinical Social Work     Living arrangements for the past 2 months: Single Family Home                                      Prior Living Arrangements/Services Living arrangements for the past 2 months: Single Family Home Lives with:: Self, Siblings Patient language and need for interpreter reviewed:: No        Need for  Family Participation in Patient Care: Yes (Comment) Care giver support system in place?: Yes (comment)   Criminal Activity/Legal Involvement Pertinent to Current Situation/Hospitalization: No - Comment as needed  Activities of Daily Living      Permission Sought/Granted Permission sought to share information with : Family Supports    Share Information with NAME: Jrake Bankes  Permission granted to share info w AGENCY: SNF  Permission granted to share info w Relationship: brother  Permission granted to share info w Contact Information: [phone]  Emotional Assessment       Orientation: : Oriented to Self, Oriented to Place Alcohol / Substance Use: Not Applicable Psych Involvement: No (comment)  Admission diagnosis:  Overdose [T50.901A] Glasgow coma scale total score 9-12, in the field (EMT or ambulance) [R40.2421] Patient Active Problem List   Diagnosis Date Noted   Avoid 05/13/2023   Insulin  dependent type 2 diabetes mellitus (HCC) 07/16/2022   Schizoaffective disorder (HCC) 11/10/2021   Alcohol use, unspecified with intoxication, unspecified (HCC) 11/10/2021   Cocaine abuse with intoxication, unspecified (HCC) 11/10/2021   Antisocial personality disorder (HCC) 11/10/2021   Cannabis dependence, continuous abuse (HCC) 11/10/2021   Opioid abuse (HCC) 11/10/2021   Post-traumatic stress disorder, chronic 11/10/2021   Schizophrenia, unspecified (HCC) 11/10/2021   CVA (cerebral vascular accident) (HCC) 07/09/2020   Acute ischemic multifocal multiple vascular  territories stroke (HCC) 06/06/2020   Seizure-like activity (HCC) 06/06/2020   Cocaine abuse (HCC)    ARF (acute renal failure) (HCC) 04/14/2020   Acute blood loss anemia 04/14/2020   GI bleed 04/14/2020   Nausea & vomiting 04/14/2020   Acute metabolic encephalopathy 04/14/2020   Hypertensive urgency 04/14/2020   Abdominal pain 04/02/2020   Protein-calorie malnutrition, severe 03/27/2020   Cerebral thrombosis  with cerebral infarction 03/26/2020   NSTEMI (non-ST elevated myocardial infarction) (HCC) 03/25/2020   Secondary diabetes mellitus with HHNC (hyperglycemia hyperosmolar non-ketotic coma) (HCC) 03/25/2020   Elevated troponin    Renal infarction [facility])    Cerebrovascular accident (CVA) (HCC)    Cocaine use disorder, severe, dependence (HCC) 04/08/2019   Major depressive disorder 04/08/2019   Epiglottitis 04/07/2019   Cocaine use 04/07/2019   Acute encephalopathy 04/07/2019   Overdose 07/06/2015   AKI (acute kidney injury) (HCC) 07/06/2015   Polysubstance abuse (HCC) 07/06/2015   DM2 (diabetes mellitus, type 2) (HCC) 07/06/2015   Dehydration    MDD (major depressive disorder), recurrent episode, mild (HCC)    Alcohol use disorder    PCP:  Maryellen Snare, NP Pharmacy:   [facility] [place], [place] - [address] [place] [address] Phone: [phone] Fax: [phone]  [id number] [place], [place] - [address] [place] [address] Phone: [phone] Fax: [phone]     Social Drivers of Health (SDOH) Social History: SDOH Screenings   Food Insecurity: Patient Unable To Answer (05/13/2023)  Housing: Patient Unable To Answer (05/13/2023)  Transportation Needs: Patient Unable To Answer (05/13/2023)  Utilities: Patient Unable To Answer (05/13/2023)  Social Connections: Patient Unable To Answer (05/13/2023)  Tobacco Use: High Risk (04/17/2023)   SDOH Interventions:     Readmission Risk Interventions     No data to display

## 2023-05-16 NOTE — NC FL2 (Signed)
 [place]  MEDICAID FL2 LEVEL OF CARE FORM     IDENTIFICATION  Patient Name: William Acevedo Birthdate: Nov 01, 1955 Sex: male Admission Date (Current Location): 05/13/2023  [facility] and [place] Number:  Producer, television/film/video and Address:  [facility] [place], [address] [place], [place] [address]      Provider Number: [id number]  Attending Physician Name and Address:  Verlie Glisson, MD  Relative Name and Phone Number:       Current Level of Care: Hospital Recommended Level of Care: Skilled Nursing Facility Prior Approval Number:    Date Approved/Denied:   PASRR Number: Pending  Discharge Plan:      Current Diagnoses: Patient Active Problem List   Diagnosis Date Noted   Avoid 05/13/2023   Insulin  dependent type 2 diabetes mellitus (HCC) 07/16/2022   Schizoaffective disorder (HCC) 11/10/2021   Alcohol use, unspecified with intoxication, unspecified (HCC) 11/10/2021   Cocaine abuse with intoxication, unspecified (HCC) 11/10/2021   Antisocial personality disorder (HCC) 11/10/2021   Cannabis dependence, continuous abuse (HCC) 11/10/2021   Opioid abuse (HCC) 11/10/2021   Post-traumatic stress disorder, chronic 11/10/2021   Schizophrenia, unspecified (HCC) 11/10/2021   CVA (cerebral vascular accident) (HCC) 07/09/2020   Acute ischemic multifocal multiple vascular territories stroke (HCC) 06/06/2020   Seizure-like activity (HCC) 06/06/2020   Cocaine abuse (HCC)    ARF (acute renal failure) (HCC) 04/14/2020   Acute blood loss anemia 04/14/2020   GI bleed 04/14/2020   Nausea & vomiting 04/14/2020   Acute metabolic encephalopathy 04/14/2020   Hypertensive urgency 04/14/2020   Abdominal pain 04/02/2020   Protein-calorie malnutrition, severe 03/27/2020   Cerebral thrombosis with cerebral infarction 03/26/2020   NSTEMI (non-ST elevated myocardial infarction) (HCC) 03/25/2020   Secondary diabetes mellitus with HHNC (hyperglycemia hyperosmolar  non-ketotic coma) (HCC) 03/25/2020   Elevated troponin    Renal infarction [facility])    Cerebrovascular accident (CVA) (HCC)    Cocaine use disorder, severe, dependence (HCC) 04/08/2019   Major depressive disorder 04/08/2019   Epiglottitis 04/07/2019   Cocaine use 04/07/2019   Acute encephalopathy 04/07/2019   Overdose 07/06/2015   AKI (acute kidney injury) (HCC) 07/06/2015   Polysubstance abuse (HCC) 07/06/2015   DM2 (diabetes mellitus, type 2) (HCC) 07/06/2015   Dehydration    MDD (major depressive disorder), recurrent episode, mild (HCC)    Alcohol use disorder     Orientation RESPIRATION BLADDER Height & Weight     Self, Place  Normal Incontinent Weight:   Height:     BEHAVIORAL SYMPTOMS/MOOD NEUROLOGICAL BOWEL NUTRITION STATUS      Incontinent Diet (please see discharge summary)  AMBULATORY STATUS COMMUNICATION OF NEEDS Skin   Extensive Assist Verbally Normal                       Personal Care Assistance Level of Assistance  Bathing, Feeding, Dressing Bathing Assistance: Maximum assistance Feeding assistance: Limited assistance Dressing Assistance: Maximum assistance     Functional Limitations Info  Sight, Hearing, Speech Sight Info: Impaired (glasses) Hearing Info: Adequate Speech Info: Impaired    SPECIAL CARE FACTORS FREQUENCY  PT (By licensed PT), OT (By licensed OT)     PT Frequency: 5x per week OT Frequency: 5x per week            Contractures Contractures Info: Not present    Additional Factors Info  Code Status, Allergies, Insulin  Sliding Scale Code Status Info: FULL Allergies Info: Lisinopril,Omeprazole   Insulin  Sliding Scale Info: see  discharge summary       Current Medications (05/16/2023):  This is the current hospital active medication list Current Facility-Administered Medications  Medication Dose Route Frequency Provider Last Rate Last Admin   acetaminophen  (TYLENOL ) tablet 650 mg  650 mg Oral Q6H PRN Doutova, Anastassia, MD        Or   acetaminophen  (TYLENOL ) suppository 650 mg  650 mg Rectal Q6H PRN Doutova, Anastassia, MD       famotidine  (PEPCID ) tablet 20 mg  20 mg Oral QAC breakfast Samtani, Jai-Gurmukh, MD   20 mg at 05/16/23 9381   insulin  aspart (novoLOG ) injection 4 Units  4 Units Subcutaneous TID WC Samtani, Jai-Gurmukh, MD   4 Units at 05/16/23 1134   insulin  glargine-yfgn (SEMGLEE ) injection 5 Units  5 Units Subcutaneous QHS Doutova, Anastassia, MD   5 Units at 05/15/23 2148   labetalol  (NORMODYNE ) injection 10 mg  10 mg Intravenous Q2H PRN Doutova, Anastassia, MD   10 mg at 05/15/23 1821   metoprolol  succinate (TOPROL -XL) 24 hr tablet 50 mg  50 mg Oral Daily Samtani, Jai-Gurmukh, MD   50 mg at 05/16/23 8299   ondansetron  (ZOFRAN ) injection 4 mg  4 mg Intravenous Q6H PRN Samtani, Jai-Gurmukh, MD   4 mg at 05/15/23 1220   ondansetron  (ZOFRAN -ODT) disintegrating tablet 4 mg  4 mg Oral Q8H PRN Samtani, Jai-Gurmukh, MD       thiamine  (VITAMIN B1) tablet 100 mg  100 mg Oral Daily Oralee Billow I, RPH   100 mg at 05/16/23 3716   traZODone  (DESYREL ) tablet 50 mg  50 mg Oral QHS Doutova, Anastassia, MD   50 mg at 05/15/23 2149     Discharge Medications: Please see discharge summary for a list of discharge medications.  Relevant Imaging Results:  Relevant Lab Results:   Additional Information SSN [id number]  Valery Gaucher, LCSW

## 2023-05-16 NOTE — Progress Notes (Signed)
                                                                                                                          Re: William Acevedo Date of Birth: [birth year] Date:05/16/2023  To Whom It May Concern:  Please be advised that the above-named patient will require a short-term nursing home stay-anticipated 30 days or less for rehabilitation and strengthening. The plan is to return home.

## 2023-05-17 DIAGNOSIS — F1420 Cocaine dependence, uncomplicated: Secondary | ICD-10-CM | POA: Diagnosis not present

## 2023-05-17 DIAGNOSIS — N179 Acute kidney failure, unspecified: Secondary | ICD-10-CM | POA: Diagnosis not present

## 2023-05-17 DIAGNOSIS — T50904A Poisoning by unspecified drugs, medicaments and biological substances, undetermined, initial encounter: Secondary | ICD-10-CM | POA: Diagnosis not present

## 2023-05-17 DIAGNOSIS — G9340 Encephalopathy, unspecified: Secondary | ICD-10-CM | POA: Diagnosis not present

## 2023-05-17 LAB — CBC WITH DIFFERENTIAL/PLATELET
Abs Immature Granulocytes: 0.01 10*3/uL (ref 0.00–0.07)
Basophils Absolute: 0 10*3/uL (ref 0.0–0.1)
Basophils Relative: 0 %
Eosinophils Absolute: 0.1 10*3/uL (ref 0.0–0.5)
Eosinophils Relative: 1 %
HCT: 39.9 % (ref 39.0–52.0)
Hemoglobin: 13 g/dL (ref 13.0–17.0)
Immature Granulocytes: 0 %
Lymphocytes Relative: 28 %
Lymphs Abs: 1.7 10*3/uL (ref 0.7–4.0)
MCH: 27.5 pg (ref 26.0–34.0)
MCHC: 32.6 g/dL (ref 30.0–36.0)
MCV: 84.4 fL (ref 80.0–100.0)
Monocytes Absolute: 0.6 10*3/uL (ref 0.1–1.0)
Monocytes Relative: 10 %
Neutro Abs: 3.6 10*3/uL (ref 1.7–7.7)
Neutrophils Relative %: 61 %
Platelets: 279 10*3/uL (ref 150–400)
RBC: 4.73 MIL/uL (ref 4.22–5.81)
RDW: 14 % (ref 11.5–15.5)
WBC: 6 10*3/uL (ref 4.0–10.5)
nRBC: 0 % (ref 0.0–0.2)

## 2023-05-17 LAB — GLUCOSE, CAPILLARY
Glucose-Capillary: 161 mg/dL — ABNORMAL HIGH (ref 70–99)
Glucose-Capillary: 165 mg/dL — ABNORMAL HIGH (ref 70–99)
Glucose-Capillary: 180 mg/dL — ABNORMAL HIGH (ref 70–99)
Glucose-Capillary: 234 mg/dL — ABNORMAL HIGH (ref 70–99)
Glucose-Capillary: 261 mg/dL — ABNORMAL HIGH (ref 70–99)

## 2023-05-17 LAB — BASIC METABOLIC PANEL WITH GFR
Anion gap: 8 (ref 5–15)
BUN: 11 mg/dL (ref 8–23)
CO2: 26 mmol/L (ref 22–32)
Calcium: 8.7 mg/dL — ABNORMAL LOW (ref 8.9–10.3)
Chloride: 101 mmol/L (ref 98–111)
Creatinine, Ser: 1.01 mg/dL (ref 0.61–1.24)
GFR, Estimated: >60 mL/min (ref >60)
Glucose, Bld: 206 mg/dL — ABNORMAL HIGH (ref 70–99)
Potassium: 4.1 mmol/L (ref 3.5–5.1)
Sodium: 135 mmol/L (ref 135–145)

## 2023-05-17 LAB — VITAMIN B1: Vitamin B1 (Thiamine): 128 nmol/L (ref 66.5–200.0)

## 2023-05-17 MED ORDER — INSULIN ASPART 100 UNIT/ML IJ SOLN: 0.0000 [IU] | Freq: Three times a day (TID) | INTRAMUSCULAR | Status: DC

## 2023-05-17 MED ORDER — INSULIN ASPART 100 UNIT/ML IJ SOLN: 0.0000 [IU] | Freq: Every day | INTRAMUSCULAR | Status: DC

## 2023-05-17 MED ORDER — INSULIN ASPART 100 UNIT/ML IJ SOLN
0.0000 [IU] | Freq: Three times a day (TID) | INTRAMUSCULAR | Status: DC
  Administered 2023-05-17: 8 [IU] via SUBCUTANEOUS
  Administered 2023-05-18 – 2023-05-19 (×3): 5 [IU] via SUBCUTANEOUS
  Administered 2023-05-19: 2 [IU] via SUBCUTANEOUS
  Administered 2023-05-20: 5 [IU] via SUBCUTANEOUS

## 2023-05-17 NOTE — Inpatient Diabetes Management (Signed)
 Inpatient Diabetes Program Recommendations  AACE/ADA: New Consensus Statement on Inpatient Glycemic Control (2015)  Target Ranges:  Prepandial:   less than 140 mg/dL      Peak postprandial:   less than 180 mg/dL (1-2 hours)      Critically ill patients:  140 - 180 mg/dL   Lab Results  Component Value Date   GLUCAP 180 (H) 05/17/2023   HGBA1C 8.6 (H) 05/13/2023    Review of Glycemic Control  Latest Reference Range & Units 05/16/23 08:14 05/16/23 11:33 05/16/23 16:48 05/16/23 19:44 05/16/23 23:38 05/17/23 08:01  Glucose-Capillary 70 - 99 mg/dL 161 (H) 096 (H) 045 (H) 251 (H) 256 (H) 180 (H)   Diabetes history: DM 2 Outpatient Diabetes medications: Lantus  12 units, Humalog  4 units tid, Metformin  500 mg bid, Farxiga  10 mg Daily Current orders for Inpatient glycemic control:  Semglee  5 units qhs Novolog  4 units tid meal coverage  Inpatient Diabetes Program Recommendations:    -   Add Novolog  0-15 units tid + hs  Thanks,  Eloise Hake RN, MSN, BC-ADM Inpatient Diabetes Coordinator Team Pager [phone] (8a-5p)

## 2023-05-17 NOTE — Progress Notes (Signed)
 Pt transferring to unit 2W. Report called off to receiving RN. Pt transported off unit via bed with belongings to the side. P. Amo Janzen Sacks RN

## 2023-05-17 NOTE — TOC Progression Note (Signed)
 Transition of Care [facility]) - Progression Note    Patient Details  Name: William Acevedo MRN: [id number] Date of Birth: 1955-08-20  Transition of Care [facility]) CM/SW Contact  Valery Gaucher, [place] Phone Number: 05/17/2023, 11:21 AM  Clinical Narrative:     PASRR approved [id number] E    04/30-5/30  Expected Discharge Plan: Skilled Nursing Facility Barriers to Discharge: Insurance Authorization, Continued Medical Work up, SNF Pending bed offer  Expected Discharge Plan and Services In-house Referral: Clinical Social Work     Living arrangements for the past 2 months: Single Family Home                                       Social Determinants of Health (SDOH) Interventions SDOH Screenings   Food Insecurity: Patient Unable To Answer (05/13/2023)  Housing: Patient Unable To Answer (05/13/2023)  Transportation Needs: Patient Unable To Answer (05/13/2023)  Utilities: Patient Unable To Answer (05/13/2023)  Social Connections: Patient Unable To Answer (05/13/2023)  Tobacco Use: High Risk (04/17/2023)    Readmission Risk Interventions     No data to display

## 2023-05-17 NOTE — TOC Progression Note (Signed)
 Transition of Care [facility]) - Progression Note    Patient Details  Name: William Acevedo MRN: [id number] Date of Birth: [birth year]  Transition of Care [facility]) CM/SW Contact  Valery Gaucher, [place] Phone Number: 05/17/2023, 11:14 AM  Clinical Narrative:     Uploaded clinicals  for [facility] - awaiting approval   Informed Eric, of the only bed offer at this time.   TOC will continue to follow and assist with discharge planning.   Liddie Reel, MSW, LCSW Clinical Social Worker    Expected Discharge Plan: Skilled Nursing Facility Barriers to Discharge: English as a second language teacher, Continued Medical Work up, SNF Pending bed offer  Expected Discharge Plan and Services In-house Referral: Clinical Social Work     Living arrangements for the past 2 months: Single Family Home                                       Social Determinants of Health (SDOH) Interventions SDOH Screenings   Food Insecurity: Patient Unable To Answer (05/13/2023)  Housing: Patient Unable To Answer (05/13/2023)  Transportation Needs: Patient Unable To Answer (05/13/2023)  Utilities: Patient Unable To Answer (05/13/2023)  Social Connections: Patient Unable To Answer (05/13/2023)  Tobacco Use: High Risk (04/17/2023)    Readmission Risk Interventions     No data to display

## 2023-05-17 NOTE — Progress Notes (Signed)
 Physical Therapy Treatment Patient Details Name: William Acevedo MRN: [id number] DOB: 1955-09-02 Today's Date: 05/17/2023   History of Present Illness The pt is a 69 yo male presenting 4/26 after falling out of a chair at a bus stop. Pt initially unresponsive, improved with narcan . UDS + cocaine. Admitted for management of AMS in setting of polysubstance use. Work up also revealed: mild rhabdomyolysis and AKI. PMH includes: CVA of L frontal lobe, L ACA territory, bilateral occipital cortex, bilateral PCA territory, bilateral cerebellar infarcrs, HTN, HLD, uncontrolled DM II, renal infarct, major depressive disorder, schizophrenia, and polysubstance abuse (cocaine and marijuana).    PT Comments  Patient unable to take steps today without +2 and noted L LE buckles with attempt to move R.  Patient eager to eat and assisted for meal.  Noted incontinent of urine and no awareness.  Feel pt may need inpatient rehab (<3 hours/day) at d/c.  PT will continue to follow.    If plan is discharge home, recommend the following: Two people to help with walking and/or transfers;A lot of help with bathing/dressing/bathroom;Assistance with cooking/housework;Assistance with feeding;Direct supervision/assist for medications management;Direct supervision/assist for financial management;Assist for transportation;Help with stairs or ramp for entrance;Supervision due to cognitive status   Can travel by private vehicle     No  Equipment Recommendations  Other (comment) (TBA)    Recommendations for Other Services       Precautions / Restrictions Precautions Precautions: Fall Recall of Precautions/Restrictions: Impaired Precaution/Restrictions Comments: falls often Restrictions Weight Bearing Restrictions Per Provider Order: No     Mobility  Bed Mobility Overal bed mobility: Needs Assistance Bed Mobility: Supine to Sit, Sit to Supine Rolling: Mod assist   Supine to sit: Max assist Sit to supine: Mod  assist   General bed mobility comments: heavy lifting help to sit up, assist for legs onto bed, repositioning and +2 to scoot hip in bed    Transfers Overall transfer level: Needs assistance Equipment used: 1 person hand held assist, Rolling walker (2 wheels), Straight cane Transfers: Sit to/from Stand Sit to Stand: Max assist           General transfer comment: heavy lifting help to stand x 2    Ambulation/Gait Ambulation/Gait assistance: Max assist Gait Distance (Feet): 2 Feet Assistive device: 1 person hand held assist Gait Pattern/deviations: Step-to pattern       General Gait Details: unable to advance RLE due to L LE buckling; keeps weight on R LE throughout standing, incontinent of urine in standing   Stairs             Wheelchair Mobility     Tilt Bed    Modified Rankin (Stroke Patients Only) Modified Rankin (Stroke Patients Only) Pre-Morbid Rankin Score: Moderately severe disability Modified Rankin: Severe disability     Balance Overall balance assessment: Needs assistance   Sitting balance-Leahy Scale: Poor Sitting balance - Comments: able to sit with S while holding rail and mod cues throughout to maintain sitting balance while PT assisting to change socks, wash feet and clean floor after urinary incontinence; min A for balance while assisting to move lines for mobility Postural control: Left lateral lean Standing balance support: Single extremity supported, Bilateral upper extremity supported Standing balance-Leahy Scale: Poor Standing balance comment: UE support and mod A for balance                            Communication Communication Communication: Impaired Factors Affecting  Communication: Difficulty expressing self (possibly aphasic)  Cognition Arousal: Lethargic Behavior During Therapy: Flat affect   PT - Cognitive impairments: No family/caregiver present to determine baseline, Orientation, Awareness, Memory, Attention,  Initiation, Sequencing, Problem solving, Safety/Judgement   Orientation impairments: Place, Time, Situation                   PT - Cognition Comments: no initiation to assist with self feeding even with hand over hand, patient opens his mouth to recieve bolus, unable to use walker or cane Following commands: Impaired Following commands impaired: Follows one step commands inconsistently, Follows one step commands with increased time    Cueing Cueing Techniques: Verbal cues, Gestural cues, Tactile cues, Visual cues  Exercises      General Comments General comments (skin integrity, edema, etc.): posey alarm belt on in bed and pillows for border; assisted with feeding pt his lunch, did not initiate despite hand over hand for self feeding or wiping his mouth with cues      Pertinent Vitals/Pain Pain Assessment Faces Pain Scale: No hurt    Home Living                          Prior Function            PT Goals (current goals can now be found in the care plan section) Progress towards PT goals: Progressing toward goals    Frequency           PT Plan      Co-evaluation              AM-PAC PT "6 Clicks" Mobility   Outcome Measure  Help needed turning from your back to your side while in a flat bed without using bedrails?: A Lot Help needed moving from lying on your back to sitting on the side of a flat bed without using bedrails?: Total Help needed moving to and from a bed to a chair (including a wheelchair)?: Total Help needed standing up from a chair using your arms (e.g., wheelchair or bedside chair)?: Total Help needed to walk in hospital room?: Total Help needed climbing 3-5 steps with a railing? : Total 6 Click Score: 7    End of Session Equipment Utilized During Treatment: Gait belt Activity Tolerance: Patient limited by fatigue Patient left: in bed;with call bell/phone within reach;with bed alarm set   PT Visit Diagnosis: Unsteadiness  on feet (R26.81);Other abnormalities of gait and mobility (R26.89);Repeated falls (R29.6);Muscle weakness (generalized) (M62.81)     Time: 1415-1500 PT Time Calculation (min) (ACUTE ONLY): 45 min  Charges:    $Therapeutic Activity: 38-52 mins PT General Charges $$ ACUTE PT VISIT: 1 Visit                     Abigail Hoff, PT Acute Rehabilitation Services Office:[phone] 05/17/2023    Marley Simmers 05/17/2023, 4:41 PM

## 2023-05-17 NOTE — Progress Notes (Signed)
 Pt transferred from 4N. Has order for telemetry, but there are no available boxes on 2W at this time. Charge RN calling other units to obtain tele monitor  Will pass on in report.

## 2023-05-17 NOTE — Progress Notes (Signed)
 PROGRESS NOTE    William Acevedo  BJY:[id number] DOB: 11-Nov-1955 DOA: 05/13/2023 PCP: William Snare, NP   Brief Narrative:  68 year old black male with known medical history of CAD status post NSTEMI, hypertension, hyperlipidemia, uncontrolled diabetes type 2, PTSD, dissociative identity disorder, schizophrenia, antisocial personality disorder, known polysubstance abuse disorder with cocaine notably.  Patient has also known history of embolic CVA in 2022 with recurrence in the same year as well as 2024.  PFO mitral valve endocarditis noted in 2022  Assessment & Plan:   Principal Problem:   Overdose Active Problems:   Cocaine use disorder, severe, dependence (HCC)   AKI (acute kidney injury) (HCC)   Polysubstance abuse (HCC)   DM2 (diabetes mellitus, type 2) (HCC)   Acute encephalopathy   Avoid   Toxic metabolic encephalopathy in the setting of cocaine use/abuse disorder Cocaine abuse relapse - Continue to monitor for signs and symptoms of detox - Improving daily but not yet back to baseline per chart review -18 months of sobriety per family with recent relapse   AKI, resolved Mild rhabdomyolysis, resolved Acute metabolic acidosis, resolved -Supportive care with IV fluids, continue to advance diet as tolerated   Elevated troponin in the setting of supply demand mismatch with cocaine use/abuse ACS ruled out History of CAD with nonobstructive disease History of CVA, embolic, multiple -EKG and labs reassuring, will discuss low-dose aspirin  use at discharge (Patient is not on dual antiplatelet therapy given multiple episodes versus anticoagulation -likely due to noncompliance) -Restart statin/Zetia    Uncontrolled diabetes type 2, A1c 8.6.   Continue current regimen including glargine Continue sliding scale insulin , hypoglycemic protocol   Previous right renal infarct requiring Foley Remove Foley, follow urine output (currently appropriate)  DVT prophylaxis: SCDs Start:  05/13/23 1945 Code Status:   Code Status: Full Code Family Communication: None present, requested I call "William Acevedo" but this contact does not appear to be in patient's chart  Status is: Patient  Dispo: The patient is from: Home              Anticipated d/c is to: To be determined (SNF pending)              Anticipated d/c date is: 48 to 72 hours              Patient currently not medically stable for discharge  Consultants:  None  Procedures:  None  Antimicrobials:  None  Subjective: No acute issues or events overnight; review of systems somewhat limited by patient's reluctance to discuss his case asking "who are you" and "why does not matter" -although he is able to orient to person and place and general situation  Objective: Vitals:   05/16/23 1712 05/16/23 1930 05/16/23 2307 05/17/23 0301  BP: (!) 160/68 (!) 164/78 (!) 170/75 (!) 163/76  Pulse: 67 74 74 72  Resp: 15 16 15    Temp:  98.4 F (36.9 C) 98.4 F (36.9 C) 97.6 F (36.4 C)  TempSrc:  Oral Oral Axillary  SpO2: 97% 98% 97%     Intake/Output Summary (Last 24 hours) at 05/17/2023 0720 Last data filed at 05/17/2023 0865 Gross per 24 hour  Intake --  Output 900 ml  Net -900 ml   There were no vitals filed for this visit.  Examination:  General:  Pleasantly resting in bed, No acute distress.  Oriented to person place and general situation HEENT:  Normocephalic atraumatic.  Sclerae nonicteric, noninjected.  Extraocular movements intact bilaterally. Neck:  Without mass or deformity.  Trachea is midline. Lungs:  Clear to auscultate bilaterally without rhonchi, wheeze, or rales. Heart:  Regular rate and rhythm.  Without murmurs, rubs, or gallops. Abdomen:  Soft, nontender, nondistended.  Without guarding or rebound.  Data Reviewed: I have personally reviewed following labs and imaging studies  CBC: Recent Labs  Lab 05/13/23 1239 05/13/23 1307 05/14/23 0438 05/15/23 0837 05/16/23 0539 05/17/23 0542  WBC 7.3   --  6.9 7.8 6.3 6.0  NEUTROABS 5.6  --   --  5.3 4.1 3.6  HGB 13.6 15.6  15.0 11.6* 12.5* 13.2 13.0  HCT 43.7 46.0  44.0 37.0* 40.0 41.3 39.9  MCV 87.8  --  86.4 86.8 84.6 84.4  PLT 250  --  261 296 280 279   Basic Metabolic Panel: Recent Labs  Lab 05/13/23 1239 05/13/23 1307 05/13/23 1653 05/14/23 0438 05/15/23 0618 05/16/23 0539  NA 139 138  138 138 139 137 135  K 4.6 4.9  4.9 4.5 3.9 4.0 4.2  CL 104 107 104 106 104 101  CO2 17*  --  17* 18* 20* 24  GLUCOSE 160* 161* 122* 122* 148* 171*  BUN 36* 45* 32* 26* 14 10  CREATININE 1.37* 1.30* 1.34* 1.14 1.01 0.98  CALCIUM  9.0  --  9.0 8.4* 8.7* 8.8*  MG  --   --  2.5* 2.3  --   --   PHOS  --   --  5.0* 4.2  --   --    GFR: CrCl cannot be calculated (Unknown ideal weight.). Liver Function Tests: Recent Labs  Lab 05/13/23 1239 05/14/23 0438  AST 30 21  ALT 15 14  ALKPHOS 49 46  BILITOT 1.2 1.1  PROT 8.0 7.0  ALBUMIN 3.9 3.4*   No results for input(s): "LIPASE", "AMYLASE" in the last 168 hours. Recent Labs  Lab 05/13/23 1239 05/14/23 1957  AMMONIA  35 15   Coagulation Profile: No results for input(s): "INR", "PROTIME" in the last 168 hours. Cardiac Enzymes: Recent Labs  Lab 05/13/23 1653 05/14/23 0946  CKTOTAL 503* 412*   BNP (last 3 results) No results for input(s): "PROBNP" in the last 8760 hours. HbA1C: No results for input(s): "HGBA1C" in the last 72 hours. CBG: Recent Labs  Lab 05/16/23 0814 05/16/23 1133 05/16/23 1648 05/16/23 1944 05/16/23 2338  GLUCAP 155* 206* 170* 251* 256*   Lipid Profile: No results for input(s): "CHOL", "HDL", "LDLCALC", "TRIG", "CHOLHDL", "LDLDIRECT" in the last 72 hours. Thyroid  Function Tests: No results for input(s): "TSH", "T4TOTAL", "FREET4", "T3FREE", "THYROIDAB" in the last 72 hours. Anemia Panel: Recent Labs    05/15/23 0837  VITAMINB12 2,041*   Sepsis Labs: Recent Labs  Lab 05/13/23 1653  LATICACIDVEN 1.3    No results found for this or any  previous visit (from the past 240 hours).   Radiology Studies: No results found.  Scheduled Meds:  aspirin  EC  81 mg Oral Daily   famotidine   20 mg Oral QAC breakfast   insulin  aspart  4 Units Subcutaneous TID WC   insulin  glargine-yfgn  5 Units Subcutaneous QHS   metoprolol  succinate  50 mg Oral Daily   thiamine   100 mg Oral Daily   traZODone   50 mg Oral QHS   Continuous Infusions:   LOS: 3 days   Time spent:  Haydee Lipa, DO [facility]  If 7PM-7AM, please contact night-coverage [email or web address]  05/17/2023, 7:20 AM

## 2023-05-17 NOTE — Plan of Care (Signed)

## 2023-05-18 DIAGNOSIS — G9340 Encephalopathy, unspecified: Secondary | ICD-10-CM | POA: Diagnosis not present

## 2023-05-18 DIAGNOSIS — T50904A Poisoning by unspecified drugs, medicaments and biological substances, undetermined, initial encounter: Secondary | ICD-10-CM | POA: Diagnosis not present

## 2023-05-18 DIAGNOSIS — F1420 Cocaine dependence, uncomplicated: Secondary | ICD-10-CM | POA: Diagnosis not present

## 2023-05-18 DIAGNOSIS — N179 Acute kidney failure, unspecified: Secondary | ICD-10-CM | POA: Diagnosis not present

## 2023-05-18 LAB — GLUCOSE, CAPILLARY
Glucose-Capillary: 214 mg/dL — ABNORMAL HIGH (ref 70–99)
Glucose-Capillary: 214 mg/dL — ABNORMAL HIGH (ref 70–99)
Glucose-Capillary: 66 mg/dL — ABNORMAL LOW (ref 70–99)
Glucose-Capillary: 155 mg/dL — ABNORMAL HIGH (ref 70–99)
Glucose-Capillary: 236 mg/dL — ABNORMAL HIGH (ref 70–99)
Glucose-Capillary: 147 mg/dL — ABNORMAL HIGH (ref 70–99)

## 2023-05-18 LAB — CBC WITH DIFFERENTIAL/PLATELET
Abs Immature Granulocytes: 0.02 10*3/uL (ref 0.00–0.07)
Basophils Absolute: 0 10*3/uL (ref 0.0–0.1)
Basophils Relative: 0 %
Eosinophils Absolute: 0 10*3/uL (ref 0.0–0.5)
Eosinophils Relative: 0 %
HCT: 41.8 % (ref 39.0–52.0)
Hemoglobin: 13.4 g/dL (ref 13.0–17.0)
Immature Granulocytes: 0 %
Lymphocytes Relative: 26 %
Lymphs Abs: 1.9 10*3/uL (ref 0.7–4.0)
MCH: 27 pg (ref 26.0–34.0)
MCHC: 32.1 g/dL (ref 30.0–36.0)
MCV: 84.1 fL (ref 80.0–100.0)
Monocytes Absolute: 0.7 10*3/uL (ref 0.1–1.0)
Monocytes Relative: 10 %
Neutro Abs: 4.4 10*3/uL (ref 1.7–7.7)
Neutrophils Relative %: 64 %
Platelets: 286 10*3/uL (ref 150–400)
RBC: 4.97 MIL/uL (ref 4.22–5.81)
RDW: 14.1 % (ref 11.5–15.5)
WBC: 7.1 10*3/uL (ref 4.0–10.5)
nRBC: 0 % (ref 0.0–0.2)

## 2023-05-18 LAB — BASIC METABOLIC PANEL WITH GFR
Anion gap: 10 (ref 5–15)
BUN: 16 mg/dL (ref 8–23)
CO2: 24 mmol/L (ref 22–32)
Calcium: 9.1 mg/dL (ref 8.9–10.3)
Chloride: 101 mmol/L (ref 98–111)
Creatinine, Ser: 0.9 mg/dL (ref 0.61–1.24)
GFR, Estimated: >60 mL/min (ref >60)
Glucose, Bld: 243 mg/dL — ABNORMAL HIGH (ref 70–99)
Potassium: 4.3 mmol/L (ref 3.5–5.1)
Sodium: 135 mmol/L (ref 135–145)

## 2023-05-18 MED ORDER — HYDROXYZINE HCL 25 MG PO TABS
25.0000 mg | ORAL_TABLET | Freq: Every day | ORAL | Status: DC
  Administered 2023-05-18 – 2023-05-19 (×2): 25 mg via ORAL
  Filled 2023-05-18 (×2): qty 1

## 2023-05-18 MED ORDER — DULOXETINE HCL 60 MG PO CPEP
60.0000 mg | ORAL_CAPSULE | Freq: Every day | ORAL | Status: DC
  Administered 2023-05-18 – 2023-05-20 (×3): 60 mg via ORAL
  Filled 2023-05-18 (×3): qty 1

## 2023-05-18 MED ORDER — OLANZAPINE 5 MG PO TABS
5.0000 mg | ORAL_TABLET | Freq: Every day | ORAL | Status: DC
  Administered 2023-05-18 – 2023-05-19 (×2): 5 mg via ORAL
  Filled 2023-05-18 (×3): qty 1

## 2023-05-18 MED ORDER — PREGABALIN 25 MG PO CAPS
50.0000 mg | ORAL_CAPSULE | Freq: Three times a day (TID) | ORAL | Status: DC
Start: 2023-05-18 — End: 2023-05-20
  Administered 2023-05-18 – 2023-05-20 (×7): 50 mg via ORAL
  Filled 2023-05-18 (×8): qty 2

## 2023-05-18 MED ORDER — TAMSULOSIN HCL 0.4 MG PO CAPS
0.4000 mg | ORAL_CAPSULE | Freq: Every day | ORAL | Status: DC
  Administered 2023-05-18 – 2023-05-19 (×2): 0.4 mg via ORAL
  Filled 2023-05-18 (×2): qty 1

## 2023-05-18 MED ORDER — AMLODIPINE BESYLATE 5 MG PO TABS
5.0000 mg | ORAL_TABLET | Freq: Every day | ORAL | Status: DC
  Administered 2023-05-18 – 2023-05-20 (×3): 5 mg via ORAL
  Filled 2023-05-18 (×3): qty 1

## 2023-05-18 NOTE — TOC Progression Note (Signed)
 Transition of Care [facility]) - Progression Note    Patient Details  Name: William Acevedo MRN: [id number] Date of Birth: April 27, 1955  [facility] CM/SW Contact  Philisha Weinel A [place], LCSW Phone Number: 05/18/2023, 11:17 AM  Clinical Narrative:     CSW reached out to [name], as pt is not oriented to have conversation. Left vm with CSW contact information to reach back out to CSW.   TOC will continue to follow.  Expected Discharge Plan: Skilled Nursing Facility Barriers to Discharge: English as a second language teacher, Continued Medical Work up, SNF Pending bed offer  Expected Discharge Plan and Services In-house Referral: Clinical Social Work     Living arrangements for the past 2 months: Single Family Home                                       Social Determinants of Health (SDOH) Interventions SDOH Screenings   Food Insecurity: Patient Unable To Answer (05/13/2023)  Housing: Patient Unable To Answer (05/13/2023)  Transportation Needs: Patient Unable To Answer (05/13/2023)  Utilities: Patient Unable To Answer (05/13/2023)  Social Connections: Patient Unable To Answer (05/13/2023)  Tobacco Use: High Risk (04/17/2023)    Readmission Risk Interventions     No data to display

## 2023-05-18 NOTE — Progress Notes (Signed)
 PROGRESS NOTE    William Acevedo  ZOX:[id number] DOB: Nov 15, 1955 DOA: 05/13/2023 PCP: Maryellen Snare, NP   Brief Narrative:  68 year old black male with known medical history of CAD status post NSTEMI, hypertension, hyperlipidemia, uncontrolled diabetes type 2, PTSD, dissociative identity disorder, schizophrenia, antisocial personality disorder, known polysubstance abuse disorder with cocaine notably.  Patient has also known history of embolic CVA in 2022 with recurrence in the same year as well as 2024.  PFO mitral valve endocarditis noted in 2022  Assessment & Plan:   Principal Problem:   Overdose Active Problems:   Cocaine use disorder, severe, dependence (HCC)   AKI (acute kidney injury) (HCC)   Polysubstance abuse (HCC)   DM2 (diabetes mellitus, type 2) (HCC)   Acute encephalopathy   Avoid   Toxic metabolic encephalopathy in the setting of cocaine use/abuse disorder Cocaine abuse relapse - Continue to monitor for signs and symptoms of detox - Improving daily but not yet back to baseline per chart review -18 months of sobriety per family with recent relapse   AKI, resolved Mild rhabdomyolysis, resolved Acute metabolic acidosis, resolved -Supportive care with IV fluids, continue to advance diet as tolerated   Elevated troponin in the setting of supply demand mismatch with cocaine use/abuse ACS ruled out History of CAD with nonobstructive disease History of CVA, embolic, multiple Uncontrolled HTN -EKG and labs reassuring, will discuss low-dose aspirin  use at discharge (Patient is not on dual antiplatelet therapy given multiple episodes versus anticoagulation -likely due to noncompliance) -Restart statin/Zetia  -Resume metoprolol  - start amlodipine (Allergy to ACE/angioedema reported)   Uncontrolled diabetes type 2, A1c 8.6.   Continue current regimen including glargine Continue sliding scale insulin , hypoglycemic protocol   Previous right renal infarct requiring  Foley Remove Foley, follow urine output (currently appropriate)  DVT prophylaxis: SCDs Start: 05/13/23 1945 Code Status:   Code Status: Full Code Family Communication: None present, requested I call "[name]" but this contact does not appear to be in patient's chart  Status is: Patient  Dispo: The patient is from: Home              Anticipated d/c is to: To be determined (SNF pending)              Anticipated d/c date is: 48 to 72 hours              Patient currently not medically stable for discharge  Consultants:  None  Procedures:  None  Antimicrobials:  None  Subjective: No acute issues or events overnight; review of systems limited by noncompliance to questions, difficult to assess orientation at this time  Objective: Vitals:   05/17/23 1837 05/17/23 2020 05/17/23 2031 05/18/23 0335  BP: (!) 156/77 (!) 154/61  (!) 162/89  Pulse: 81 78  71  Resp: 18 16  20   Temp: 98.9 F (37.2 C)  98.1 F (36.7 C) 97.9 F (36.6 C)  TempSrc: Axillary  Oral Oral  SpO2: 100% 98%  100%    Intake/Output Summary (Last 24 hours) at 05/18/2023 8119 Last data filed at 05/18/2023 0640 Gross per 24 hour  Intake --  Output 700 ml  Net -700 ml   There were no vitals filed for this visit.  Examination:  General:  Pleasantly resting in bed, No acute distress.  Oriented to person place and general situation HEENT:  Normocephalic atraumatic.  Sclerae nonicteric, noninjected.  Extraocular movements intact bilaterally. Neck:  Without mass or deformity.  Trachea is midline. Lungs:  Clear to auscultate bilaterally  without rhonchi, wheeze, or rales. Heart:  Regular rate and rhythm.  Without murmurs, rubs, or gallops. Abdomen:  Soft, nontender, nondistended.  Without guarding or rebound.  Data Reviewed: I have personally reviewed following labs and imaging studies  CBC: Recent Labs  Lab 05/13/23 1239 05/13/23 1307 05/14/23 0438 05/15/23 0837 05/16/23 0539 05/17/23 0542  WBC 7.3  --  6.9 7.8  6.3 6.0  NEUTROABS 5.6  --   --  5.3 4.1 3.6  HGB 13.6 15.6  15.0 11.6* 12.5* 13.2 13.0  HCT 43.7 46.0  44.0 37.0* 40.0 41.3 39.9  MCV 87.8  --  86.4 86.8 84.6 84.4  PLT 250  --  261 296 280 279   Basic Metabolic Panel: Recent Labs  Lab 05/13/23 1653 05/14/23 0438 05/15/23 0618 05/16/23 0539 05/17/23 0542  NA 138 139 137 135 135  K 4.5 3.9 4.0 4.2 4.1  CL 104 106 104 101 101  CO2 17* 18* 20* 24 26  GLUCOSE 122* 122* 148* 171* 206*  BUN 32* 26* 14 10 11   CREATININE 1.34* 1.14 1.01 0.98 1.01  CALCIUM  9.0 8.4* 8.7* 8.8* 8.7*  MG 2.5* 2.3  --   --   --   PHOS 5.0* 4.2  --   --   --    GFR: CrCl cannot be calculated (Unknown ideal weight.). Liver Function Tests: Recent Labs  Lab 05/13/23 1239 05/14/23 0438  AST 30 21  ALT 15 14  ALKPHOS 49 46  BILITOT 1.2 1.1  PROT 8.0 7.0  ALBUMIN 3.9 3.4*   No results for input(s): "LIPASE", "AMYLASE" in the last 168 hours. Recent Labs  Lab 05/13/23 1239 05/14/23 1957  AMMONIA  35 15   Coagulation Profile: No results for input(s): "INR", "PROTIME" in the last 168 hours. Cardiac Enzymes: Recent Labs  Lab 05/13/23 1653 05/14/23 0946  CKTOTAL 503* 412*   BNP (last 3 results) No results for input(s): "PROBNP" in the last 8760 hours. HbA1C: No results for input(s): "HGBA1C" in the last 72 hours. CBG: Recent Labs  Lab 05/17/23 1133 05/17/23 1608 05/17/23 2034 05/17/23 2342 05/18/23 0337  GLUCAP 234* 261* 161* 165* 214*   Lipid Profile: No results for input(s): "CHOL", "HDL", "LDLCALC", "TRIG", "CHOLHDL", "LDLDIRECT" in the last 72 hours. Thyroid  Function Tests: No results for input(s): "TSH", "T4TOTAL", "FREET4", "T3FREE", "THYROIDAB" in the last 72 hours. Anemia Panel: Recent Labs    05/15/23 0837  VITAMINB12 2,041*   Sepsis Labs: Recent Labs  Lab 05/13/23 1653  LATICACIDVEN 1.3    No results found for this or any previous visit (from the past 240 hours).   Radiology Studies: No results  found.  Scheduled Meds:  aspirin  EC  81 mg Oral Daily   famotidine   20 mg Oral QAC breakfast   insulin  aspart  0-15 Units Subcutaneous TID WC   insulin  aspart  0-5 Units Subcutaneous QHS   insulin  aspart  4 Units Subcutaneous TID WC   insulin  glargine-yfgn  5 Units Subcutaneous QHS   metoprolol  succinate  50 mg Oral Daily   thiamine   100 mg Oral Daily   traZODone   50 mg Oral QHS   Continuous Infusions:   LOS: 4 days   Time spent:  Haydee Lipa, DO [facility]  If 7PM-7AM, please contact night-coverage [email or web address]  05/18/2023, 6:52 AM

## 2023-05-18 NOTE — Plan of Care (Signed)

## 2023-05-19 DIAGNOSIS — F1420 Cocaine dependence, uncomplicated: Secondary | ICD-10-CM | POA: Diagnosis not present

## 2023-05-19 DIAGNOSIS — G9340 Encephalopathy, unspecified: Secondary | ICD-10-CM | POA: Diagnosis not present

## 2023-05-19 DIAGNOSIS — N179 Acute kidney failure, unspecified: Secondary | ICD-10-CM | POA: Diagnosis not present

## 2023-05-19 DIAGNOSIS — T50904A Poisoning by unspecified drugs, medicaments and biological substances, undetermined, initial encounter: Secondary | ICD-10-CM | POA: Diagnosis not present

## 2023-05-19 LAB — GLUCOSE, CAPILLARY
Glucose-Capillary: 129 mg/dL — ABNORMAL HIGH (ref 70–99)
Glucose-Capillary: 136 mg/dL — ABNORMAL HIGH (ref 70–99)
Glucose-Capillary: 165 mg/dL — ABNORMAL HIGH (ref 70–99)
Glucose-Capillary: 173 mg/dL — ABNORMAL HIGH (ref 70–99)
Glucose-Capillary: 177 mg/dL — ABNORMAL HIGH (ref 70–99)
Glucose-Capillary: 210 mg/dL — ABNORMAL HIGH (ref 70–99)

## 2023-05-19 NOTE — Progress Notes (Signed)
 Mobility Specialist: Progress Note   05/19/23 1419  Mobility  Activity Transferred from chair to bed  Level of Assistance Moderate assist, patient does 50-74%  Assistive Device Other (Comment) (HHA)  Activity Response Tolerated well  Mobility Referral Yes  Mobility visit 1 Mobility  Mobility Specialist Start Time (ACUTE ONLY) 1330  Mobility Specialist Stop Time (ACUTE ONLY) 1339  Mobility Specialist Time Calculation (min) (ACUTE ONLY) 9 min    Pt ready to move back to bed - received in chair. ModA for STS and stand pivot to bed. L knee buckling 2x during transfer. SV for bed mobility. Left in supine with all needs met, call bell in reach. Bed alarm on.   Deloria Fetch Mobility Specialist Please contact via SecureChat or Rehab office at [phone]

## 2023-05-19 NOTE — Plan of Care (Signed)

## 2023-05-19 NOTE — Progress Notes (Signed)
 Bladder scan pt and was . This RN notified Diego Foy, MD. MD will like to do and in and out or straight cath for now. Output documented in flowsheet. Will continue to monitor.

## 2023-05-19 NOTE — Progress Notes (Signed)
 Orthopedic Tech Progress Note Patient Details:  William Acevedo 1955/12/30 [id number]  Ortho Devices Type of Ortho Device: Knee Immobilizer Ortho Device/Splint Location: LLE/delivered by [name]/PT will apply Ortho Device/Splint Interventions: [name] 05/19/2023, 3:35 PM

## 2023-05-19 NOTE — TOC Progression Note (Addendum)
 Transition of Care [facility]) - Progression Note    Patient Details  Name: William Acevedo MRN: [id number] Date of Birth: 1955/08/09  [facility] CM/SW Contact  Juandaniel Manfredo A [place], LCSW Phone Number: 05/19/2023, 10:49 AM  Clinical Narrative:     Update 1443: Pt's authorization status pending. Reference ID: [id number] Bed available at [facility], can DC over weekend if auth approved.    CSW contacted [name] and accepted bed offer to [facility], still agreeable for plan for rehab.   CSW to start insurance authorization once pt is close to medical stability.   CSW to find out from [facility] when bed is available.    TOC will continue to follow.    Expected Discharge Plan: Skilled Nursing Facility Barriers to Discharge: English as a second language teacher, Continued Medical Work up, SNF Pending bed offer  Expected Discharge Plan and Services In-house Referral: Clinical Social Work     Living arrangements for the past 2 months: Single Family Home                                       Social Determinants of Health (SDOH) Interventions SDOH Screenings   Food Insecurity: Patient Unable To Answer (05/13/2023)  Housing: Patient Unable To Answer (05/13/2023)  Transportation Needs: Patient Unable To Answer (05/13/2023)  Utilities: Patient Unable To Answer (05/13/2023)  Social Connections: Patient Unable To Answer (05/13/2023)  Tobacco Use: High Risk (04/17/2023)    Readmission Risk Interventions     No data to display

## 2023-05-19 NOTE — Progress Notes (Signed)
 PROGRESS NOTE    William Acevedo  FAO:[id number] DOB: [birth year] DOA: 05/13/2023 PCP: Maryellen Snare, NP   Brief Narrative:  68 year old black male with known medical history of CAD status post NSTEMI, hypertension, hyperlipidemia, uncontrolled diabetes type 2, PTSD, dissociative identity disorder, schizophrenia, antisocial personality disorder, known polysubstance abuse disorder with cocaine notably.  Patient has also known history of embolic CVA in 2022 with recurrence in the same year as well as 2024.  PFO mitral valve endocarditis noted in 2022  Assessment & Plan:   Principal Problem:   Overdose Active Problems:   Cocaine use disorder, severe, dependence (HCC)   AKI (acute kidney injury) (HCC)   Polysubstance abuse (HCC)   DM2 (diabetes mellitus, type 2) (HCC)   Acute encephalopathy   Avoid   Toxic metabolic encephalopathy in the setting of cocaine use/abuse disorder Cocaine abuse relapse - Continue to monitor for signs and symptoms of detox - Improving daily but not yet back to baseline per chart review -18 months of sobriety per family with recent relapse   AKI, resolved Mild rhabdomyolysis, resolved Acute metabolic acidosis, resolved -Supportive care with IV fluids, continue to advance diet as tolerated   Elevated troponin in the setting of supply demand mismatch with cocaine use/abuse ACS ruled out History of CAD with nonobstructive disease History of CVA, embolic, multiple Uncontrolled HTN -EKG and labs reassuring, will discuss low-dose aspirin  use at discharge (Patient is not on dual antiplatelet therapy given multiple episodes versus anticoagulation -likely due to noncompliance) -Restart statin/Zetia  -Resume metoprolol  - start amlodipine (Allergy to ACE/angioedema reported)   Uncontrolled diabetes type 2, A1c 8.6.   Continue current regimen including glargine Continue sliding scale insulin , hypoglycemic protocol   Previous right renal infarct requiring  Foley Remove Foley, follow urine output (currently appropriate)  DVT prophylaxis: SCDs Start: 05/13/23 1945 Code Status:   Code Status: Full Code Family Communication: None present, requested I call "[name]" but this contact does not appear to be in patient's chart  Status is: Patient  Dispo: The patient is from: Home              Anticipated d/c is to: To be determined (SNF pending)              Anticipated d/c date is: 48 to 72 hours              Patient currently not medically stable for discharge  Consultants:  None  Procedures:  None  Antimicrobials:  None  Subjective: No acute issues or events overnight; review of systems limited by noncompliance to questions, difficult to assess orientation at this time  Objective: Vitals:   05/18/23 0811 05/18/23 1625 05/18/23 2049 05/19/23 0405  BP: (!) 164/83 139/64 132/71 (!) 149/61  Pulse: 78 68 71 62  Resp: 18 19 20 19   Temp: 97.6 F (36.4 C) 98.3 F (36.8 C) 98.2 F (36.8 C) (!) 97.5 F (36.4 C)  TempSrc: Oral Oral    SpO2: 99% 100% 98% 100%    Intake/Output Summary (Last 24 hours) at 05/19/2023 6962 Last data filed at 05/18/2023 1800 Gross per 24 hour  Intake --  Output 650 ml  Net -650 ml   There were no vitals filed for this visit.  Examination:  General:  Pleasantly resting in bed, No acute distress.  Oriented to person place and general situation HEENT:  Normocephalic atraumatic.  Sclerae nonicteric, noninjected.  Extraocular movements intact bilaterally. Neck:  Without mass or deformity.  Trachea is midline. Lungs:  Clear  to auscultate bilaterally without rhonchi, wheeze, or rales. Heart:  Regular rate and rhythm.  Without murmurs, rubs, or gallops. Abdomen:  Soft, nontender, nondistended.  Without guarding or rebound.  Data Reviewed: I have personally reviewed following labs and imaging studies  CBC: Recent Labs  Lab 05/13/23 1239 05/13/23 1307 05/14/23 0438 05/15/23 0837 05/16/23 0539 05/17/23 0542  05/18/23 0648  WBC 7.3  --  6.9 7.8 6.3 6.0 7.1  NEUTROABS 5.6  --   --  5.3 4.1 3.6 4.4  HGB 13.6   < > 11.6* 12.5* 13.2 13.0 13.4  HCT 43.7   < > 37.0* 40.0 41.3 39.9 41.8  MCV 87.8  --  86.4 86.8 84.6 84.4 84.1  PLT 250  --  261 296 280 279 286   < > = values in this interval not displayed.   Basic Metabolic Panel: Recent Labs  Lab 05/13/23 1653 05/14/23 0438 05/15/23 0618 05/16/23 0539 05/17/23 0542 05/18/23 0648  NA 138 139 137 135 135 135  K 4.5 3.9 4.0 4.2 4.1 4.3  CL 104 106 104 101 101 101  CO2 17* 18* 20* 24 26 24   GLUCOSE 122* 122* 148* 171* 206* 243*  BUN 32* 26* 14 10 11 16   CREATININE 1.34* 1.14 1.01 0.98 1.01 0.90  CALCIUM  9.0 8.4* 8.7* 8.8* 8.7* 9.1  MG 2.5* 2.3  --   --   --   --   PHOS 5.0* 4.2  --   --   --   --    GFR: CrCl cannot be calculated (Unknown ideal weight.). Liver Function Tests: Recent Labs  Lab 05/13/23 1239 05/14/23 0438  AST 30 21  ALT 15 14  ALKPHOS 49 46  BILITOT 1.2 1.1  PROT 8.0 7.0  ALBUMIN 3.9 3.4*   No results for input(s): "LIPASE", "AMYLASE" in the last 168 hours. Recent Labs  Lab 05/13/23 1239 05/14/23 1957  AMMONIA  35 15   Coagulation Profile: No results for input(s): "INR", "PROTIME" in the last 168 hours. Cardiac Enzymes: Recent Labs  Lab 05/13/23 1653 05/14/23 0946  CKTOTAL 503* 412*   BNP (last 3 results) No results for input(s): "PROBNP" in the last 8760 hours. HbA1C: No results for input(s): "HGBA1C" in the last 72 hours. CBG: Recent Labs  Lab 05/18/23 1244 05/18/23 1707 05/18/23 2048 05/19/23 0014 05/19/23 0408  GLUCAP 155* 236* 147* 136* 165*   Lipid Profile: No results for input(s): "CHOL", "HDL", "LDLCALC", "TRIG", "CHOLHDL", "LDLDIRECT" in the last 72 hours. Thyroid  Function Tests: No results for input(s): "TSH", "T4TOTAL", "FREET4", "T3FREE", "THYROIDAB" in the last 72 hours. Anemia Panel: No results for input(s): "VITAMINB12", "FOLATE", "FERRITIN", "TIBC", "IRON", "RETICCTPCT" in  the last 72 hours.  Sepsis Labs: Recent Labs  Lab 05/13/23 1653  LATICACIDVEN 1.3    No results found for this or any previous visit (from the past 240 hours).   Radiology Studies: No results found.  Scheduled Meds:  amLODipine   5 mg Oral Daily   aspirin  EC  81 mg Oral Daily   DULoxetine   60 mg Oral Daily   famotidine   20 mg Oral QAC breakfast   hydrOXYzine   25 mg Oral QHS   insulin  aspart  0-15 Units Subcutaneous TID WC   insulin  aspart  0-5 Units Subcutaneous QHS   insulin  aspart  4 Units Subcutaneous TID WC   insulin  glargine-yfgn  5 Units Subcutaneous QHS   metoprolol  succinate  50 mg Oral Daily   OLANZapine   5 mg Oral QHS  pregabalin   50 mg Oral TID   tamsulosin   0.4 mg Oral QHS   thiamine   100 mg Oral Daily   traZODone   50 mg Oral QHS   Continuous Infusions:   LOS: 5 days   Time spent:  Haydee Lipa, DO [facility]  If 7PM-7AM, please contact night-coverage [email or web address]  05/19/2023, 7:14 AM

## 2023-05-19 NOTE — Progress Notes (Addendum)
 Physical Therapy Treatment Patient Details Name: William Acevedo MRN: [id number] DOB: [birth year] Today's Date: 05/19/2023   History of Present Illness The pt is a 68 yo male presenting 4/26 after falling out of a chair at a bus stop. Pt initially unresponsive, improved with narcan . UDS + cocaine. Admitted for management of AMS in setting of polysubstance use. Work up also revealed: mild rhabdomyolysis and AKI. PMH includes: CVA of L frontal lobe, L ACA territory, bilateral occipital cortex, bilateral PCA territory, bilateral cerebellar infarcrs, HTN, HLD, uncontrolled DM II, renal infarct, major depressive disorder, schizophrenia, and polysubstance abuse (cocaine and marijuana).    PT Comments  Pt received in supine, calm and cooperative, A&O x1 and with perseveration on DOB when PTA attempting to assess orientation to location/situation/time. Pt with poor awareness of PLOF and situation however he attempts to follow simple commands and is able to report symptoms of dizziness while sitting EOB, see VS below. HR/SpO2 WFL on RA. RN/MD notified of pt LLE buckling, pt would benefit from L KI due to LLE buckling with standing activity. Also, pt may benefit from further cognitive work-up by SLP or OT if he's not at cognitive baseline - no family in room during session to confirm this. BP improved in recliner, chair alarm on for pt safety, RN/NT and Mobility Specialist notified of safe technique for return transfers. Patient will benefit from continued inpatient follow up therapy, <3 hours/day.  Vital Signs  Patient Position (if appropriate) Orthostatic Vitals  Orthostatic Sitting - EOB  BP- Sitting "dizzy" 94/58 (71)  Pulse- Sitting 67   Orthostatic Recliner  BP- Reclined in chair 100/61 (74) (recliner)  Pulse- Lying 67     If plan is discharge home, recommend the following: Two people to help with walking and/or transfers;A lot of help with bathing/dressing/bathroom;Assistance with  cooking/housework;Assistance with feeding;Direct supervision/assist for medications management;Direct supervision/assist for financial management;Assist for transportation;Help with stairs or ramp for entrance;Supervision due to cognitive status   Can travel by private vehicle     No  Equipment Recommendations  Other (comment) (TBD; per chart review he has WC, 3in1, ramp, but if this equipment is not available he may need replacements for these, with drop arm for WC and BSC if available)    Recommendations for Other Services Speech consult;Other (comment) (MD/RN notified of cog deficits, unclear what his baseline is; may not be needed if he is at baseline)     Precautions / Restrictions Precautions Precautions: Fall Recall of Precautions/Restrictions: Impaired Precaution/Restrictions Comments: falls often; watch BP Restrictions Weight Bearing Restrictions Per Provider Order: No     Mobility  Bed Mobility Overal bed mobility: Needs Assistance Bed Mobility: Rolling, Sidelying to Sit Rolling: Min assist Sidelying to sit: Mod assist, Used rails, +2 for physical assistance       General bed mobility comments: increased time and multimodal cues to perform    Transfers Overall transfer level: Needs assistance Equipment used: 2 person hand held assist Transfers: Sit to/from Stand, Bed to chair/wheelchair/BSC Sit to Stand: Mod assist, Min assist, +2 physical assistance   Step pivot transfers: Mod assist, +2 physical assistance Squat pivot transfers: Min assist, +2 physical assistance     General transfer comment: STS and squat pivot x2 toward [facility] with +2 HHA behind pt forearms and good following of cues for technique after initial demo; L knee blocked and posterior lean with x3 pivotal steps toward chair on his L side with some buckling on L while transferring but with knee blocked pt  able to maintain upright posture while stepping. He may benefit from [facility] for next PT session for  improved standing tolerance. Chair alarm placed/activated once up in recliner, RN/NT notified of safe technique for return transfer.    Ambulation/Gait               General Gait Details: see tf section above   Stairs             Wheelchair Mobility     Tilt Bed    Modified Rankin (Stroke Patients Only) Modified Rankin (Stroke Patients Only) Pre-Morbid Rankin Score: Moderately severe disability Modified Rankin: Severe disability     Balance Overall balance assessment: Needs assistance Sitting-balance support: Single extremity supported, Feet supported Sitting balance-Leahy Scale: Poor Sitting balance - Comments: variable CGA to close Supervision at EOB Postural control: Left lateral lean Standing balance support: Bilateral upper extremity supported, During functional activity Standing balance-Leahy Scale: Poor Standing balance comment: BUE support, buckling on LLE                            Communication Communication Communication: Impaired Factors Affecting Communication: Difficulty expressing self;Reduced clarity of speech (perseverating on same phrases, difficult to redirect)  Cognition Arousal: Alert (becoming more drowsy toward end of session) Behavior During Therapy: Flat affect   PT - Cognitive impairments: No family/caregiver present to determine baseline, Orientation, Awareness, Memory, Attention, Initiation, Sequencing, Problem solving, Safety/Judgement   Orientation impairments: Place, Time, Situation                   PT - Cognition Comments: Pt perseverates on his DOB when asked other orientation questions after giving DOB. With hints, he is unable to state city he is in or month/year, but he is able to state "hospital" when asked what type of building he is in. Pt states he lives in [place] which appears incorrect according to the chart. He states he lives with his brother who can help him. Pt making effort to follow 1-step  commands but poor safety awareness. Following commands: Impaired Following commands impaired: Follows one step commands inconsistently, Follows one step commands with increased time    Cueing Cueing Techniques: Verbal cues, Gestural cues, Tactile cues, Visual cues  Exercises Other Exercises Other Exercises: seated BLE AROM: LAQ, hip flexion x10 reps ea Other Exercises: STS x 4 trials with L foot and knee blocked    General Comments General comments (skin integrity, edema, etc.): MD/RN notified pt needs L KI ordered due to buckling to see if he can progress gait training/standing tolerance with therapies.      Pertinent Vitals/Pain Pain Assessment Pain Assessment: No/denies pain Faces Pain Scale: No hurt Breathing: normal Negative Vocalization: none Facial Expression: smiling or inexpressive Body Language: relaxed Consolability: no need to console PAINAD Score: 0 Pain Intervention(s): Monitored during session, Repositioned    Home Living                          Prior Function            PT Goals (current goals can now be found in the care plan section) Acute Rehab PT Goals Patient Stated Goal: To return home PT Goal Formulation: With patient Time For Goal Achievement: 05/29/23 Progress towards PT goals: Progressing toward goals    Frequency    Min 2X/week      PT Plan      Co-evaluation  AM-PAC PT "6 Clicks" Mobility   Outcome Measure  Help needed turning from your back to your side while in a flat bed without using bedrails?: A Lot Help needed moving from lying on your back to sitting on the side of a flat bed without using bedrails?: A Lot Help needed moving to and from a bed to a chair (including a wheelchair)?: Total (+2 assist) Help needed standing up from a chair using your arms (e.g., wheelchair or bedside chair)?: A Lot Help needed to walk in hospital room?: Total Help needed climbing 3-5 steps with a railing? : Total 6  Click Score: 9    End of Session Equipment Utilized During Treatment: Gait belt Activity Tolerance: Patient tolerated treatment well;Treatment limited secondary to medical complications (Comment);Other (comment) (L knee buckling, need to trial KI to see if he can stand more safely with brace; dizzy wtih EOB, see VS) Patient left: in chair;with call bell/phone within reach;with chair alarm set;Other (comment) (RN/NT notified he has not eaten lunch yet and needs assist) Nurse Communication: Mobility status;Precautions;Other (comment) (needs L KI ordered, needs feeding assist) PT Visit Diagnosis: Unsteadiness on feet (R26.81);Other abnormalities of gait and mobility (R26.89);Repeated falls (R29.6);Muscle weakness (generalized) (M62.81) Hemiplegia - Right/Left: Right (also LLE weakness) Hemiplegia - dominant/non-dominant: Dominant     Time: 7829-5621 PT Time Calculation (min) (ACUTE ONLY): 24 min  Charges:    $Therapeutic Exercise: 8-22 mins $Therapeutic Activity: 8-22 mins PT General Charges $$ ACUTE PT VISIT: 1 Visit                     Orva Gwaltney P., PTA Acute Rehabilitation Services Secure Chat Preferred 9a-5:30pm Office: [phone]    Mariel Shope [facility] 05/19/2023, 2:56 PM

## 2023-05-20 ENCOUNTER — Encounter (HOSPITAL_COMMUNITY): Payer: Self-pay | Admitting: Internal Medicine

## 2023-05-20 ENCOUNTER — Other Ambulatory Visit: Payer: Self-pay

## 2023-05-20 DIAGNOSIS — F1910 Other psychoactive substance abuse, uncomplicated: Secondary | ICD-10-CM | POA: Diagnosis not present

## 2023-05-20 DIAGNOSIS — G9340 Encephalopathy, unspecified: Secondary | ICD-10-CM | POA: Diagnosis not present

## 2023-05-20 DIAGNOSIS — F1420 Cocaine dependence, uncomplicated: Secondary | ICD-10-CM | POA: Diagnosis not present

## 2023-05-20 DIAGNOSIS — T50904A Poisoning by unspecified drugs, medicaments and biological substances, undetermined, initial encounter: Secondary | ICD-10-CM | POA: Diagnosis not present

## 2023-05-20 LAB — GLUCOSE, CAPILLARY
Glucose-Capillary: 174 mg/dL — ABNORMAL HIGH (ref 70–99)
Glucose-Capillary: 186 mg/dL — ABNORMAL HIGH (ref 70–99)
Glucose-Capillary: 206 mg/dL — ABNORMAL HIGH (ref 70–99)
Glucose-Capillary: 213 mg/dL — ABNORMAL HIGH (ref 70–99)

## 2023-05-20 MED ORDER — AMLODIPINE BESYLATE 5 MG PO TABS: 5.0000 mg | ORAL_TABLET | Freq: Every day | ORAL | 1 refills | Status: AC

## 2023-05-20 MED ORDER — TRAZODONE HCL 50 MG PO TABS: 50.0000 mg | ORAL_TABLET | Freq: Every day | ORAL | 1 refills | Status: DC

## 2023-05-20 MED ORDER — VITAMIN B-1 100 MG PO TABS: 100.0000 mg | ORAL_TABLET | Freq: Every day | ORAL | 0 refills | Status: AC

## 2023-05-20 MED ORDER — INSULIN GLARGINE 100 UNIT/ML SOLOSTAR PEN: 10.0000 [IU] | PEN_INJECTOR | Freq: Every day | SUBCUTANEOUS | 1 refills | Status: DC

## 2023-05-20 MED ORDER — ASPIRIN 81 MG PO TBEC: 81.0000 mg | DELAYED_RELEASE_TABLET | Freq: Every day | ORAL | 12 refills | Status: AC

## 2023-05-20 NOTE — Plan of Care (Signed)
  Problem: Education: Goal: Ability to describe self-care measures that may prevent or decrease complications (Diabetes Survival Skills Education) will improve Outcome: Progressing Goal: Individualized Educational Video(s) Outcome: Progressing   Problem: Coping: Goal: Ability to adjust to condition or change in health will improve Outcome: Progressing   Problem: Fluid Volume: Goal: Ability to maintain a balanced intake and output will improve Outcome: Progressing   Problem: Health Behavior/Discharge Planning: Goal: Ability to identify and utilize available resources and services will improve Outcome: Progressing Goal: Ability to manage health-related needs will improve Outcome: Progressing   Problem: Metabolic: Goal: Ability to maintain appropriate glucose levels will improve Outcome: Progressing   Problem: Nutritional: Goal: Maintenance of adequate nutrition will improve Outcome: Progressing Goal: Progress toward achieving an optimal weight will improve Outcome: Progressing   Problem: Skin Integrity: Goal: Risk for impaired skin integrity will decrease Outcome: Progressing   Problem: Tissue Perfusion: Goal: Adequacy of tissue perfusion will improve Outcome: Progressing   Problem: Education: Goal: Knowledge of General Education information will improve Description: Including pain rating scale, medication(s)/side effects and non-pharmacologic comfort measures Outcome: Progressing   Problem: Health Behavior/Discharge Planning: Goal: Ability to manage health-related needs will improve Outcome: Progressing   Problem: Clinical Measurements: Goal: Ability to maintain clinical measurements within normal limits will improve Outcome: Progressing Goal: Will remain free from infection Outcome: Progressing Goal: Diagnostic test results will improve Outcome: Progressing Goal: Respiratory complications will improve Outcome: Progressing Goal: Cardiovascular complication will  be avoided Outcome: Progressing   Problem: Activity: Goal: Risk for activity intolerance will decrease Outcome: Progressing   Problem: Nutrition: Goal: Adequate nutrition will be maintained Outcome: Progressing   Problem: Coping: Goal: Level of anxiety will decrease Outcome: Progressing   Problem: Elimination: Goal: Will not experience complications related to bowel motility Outcome: Progressing   Problem: Pain Managment: Goal: General experience of comfort will improve and/or be controlled Outcome: Progressing   Problem: Safety: Goal: Ability to remain free from injury will improve Outcome: Progressing   Problem: Skin Integrity: Goal: Risk for impaired skin integrity will decrease Outcome: Progressing

## 2023-05-20 NOTE — Discharge Summary (Signed)
 Physician Discharge Summary  William Acevedo WGN:[id number] DOB: 1955-01-18 DOA: 05/13/2023  PCP: Maryellen Snare, NP  Admit date: 05/13/2023 Discharge date: 05/20/2023  Admitted From: Home Disposition: SNF  Recommendations for Outpatient Follow-up:  Follow up with PCP in 1-2 weeks  Discharge Condition: Stable CODE STATUS: Full Diet recommendation: Diabetic diet, low-salt low-fat as tolerated  Brief/Interim Summary: 68 year old black male with known medical history of CAD status post NSTEMI, hypertension, hyperlipidemia, uncontrolled diabetes type 2, PTSD, dissociative identity disorder, schizophrenia, antisocial personality disorder, known polysubstance abuse disorder with cocaine notably.  Patient has also known history of embolic CVA in 2022 with recurrence in the same year as well as 2024.  PFO mitral valve endocarditis noted in 2022.  Patient admitted with toxic encephalopathy in the setting of cocaine use/abuse with relapse.  His mental status continues to improve but is not yet back to baseline, given his prolonged use, age and comorbid conditions he may not fully recover to previous baseline.  Given his ongoing need for PT OT and monitoring patient will discharge to SNF for ongoing care.  Ultimate goal is to discharge patient back home/with family for ongoing needs.  Patient's mild AKI, rhabdo and acidosis have resolved with IV fluids and supportive care.  Minimally elevated troponin in setting of supply demand mismatch with cocaine use and abuse, ACS ruled out although patient does have notable history of nonobstructive CAD, previous CVA and uncontrolled hypertension.  Medication changes as below including addition of amlodipine  and discontinuation of multiple CNS depressants in hopes to improve patient's blood pressure and mental status respectively.  Patient otherwise stable for discharge to facility.  Of note patient has occasionally required In-N-Out cath during his hospitalization  which is not uncommon given his hospital history.  If patient continues to require In-N-Out cath routinely would recommend Foley placement and outpatient follow-up with his prior urologist at [facility] and nephrology in [place].  Discharge Diagnoses:  Principal Problem:   Overdose Active Problems:   Cocaine use disorder, severe, dependence (HCC)   AKI (acute kidney injury) (HCC)   Polysubstance abuse (HCC)   DM2 (diabetes mellitus, type 2) (HCC)   Acute encephalopathy   Avoid  Toxic metabolic encephalopathy in the setting of cocaine use/abuse disorder Cocaine abuse relapse - No signs or symptoms of withdrawal at this point, likely approaching baseline but given his ongoing needs as above plan for discharge to SNF for ongoing therapy and care.  Ambulatory dysfunction, POA  - Likely exacerbated by above, based on ambulatory likely poor but has improved with PT OT recommending SNF placement as above  AKI, resolved Mild rhabdomyolysis, resolved Acute metabolic acidosis, resolved - Continue to advance diet as tolerated   Elevated troponin in the setting of supply demand mismatch with cocaine use/abuse ACS ruled out History of CAD with nonobstructive disease History of CVA, embolic, multiple Uncontrolled HTN -EKG and labs reassuring, will discuss low-dose aspirin  use at discharge (Patient is not on dual antiplatelet therapy given multiple episodes versus anticoagulation -likely due to noncompliance) - Continue statin, Zetia , metoprolol , amlodipine : (Allergy to ACE/angioedema reported)   Uncontrolled diabetes type 2, A1c 8.6.   Continue current regimen including glargine Continue sliding scale insulin , hypoglycemic protocol   Previous right renal infarct requiring Foley As above if continues to require In-N-Out cath frequently will place Foley and follow-up with [facility] urology in [place]; previously required Foley catheter under their care    Discharge  Instructions  Discharge Instructions     Call MD for:  difficulty  breathing, headache or visual disturbances   Complete by: As directed    Call MD for:  extreme fatigue   Complete by: As directed    Call MD for:  hives   Complete by: As directed    Call MD for:  persistant dizziness or light-headedness   Complete by: As directed    Call MD for:  persistant nausea and vomiting   Complete by: As directed    Call MD for:  severe uncontrolled pain   Complete by: As directed    Call MD for:  temperature >100.4   Complete by: As directed    Diet - low sodium heart healthy   Complete by: As directed    Increase activity slowly   Complete by: As directed       Allergies as of 05/20/2023       Reactions   Lisinopril Swelling, Other (See Comments)   Angioedema and the patient stated it made his lips swell Pt reports not allergic 06/07/20   Omeprazole Swelling, Other (See Comments)   ANGIOEDEMA OF LIPS, Angioedema of tongue Pt reports not allergic 06/07/20        Medication List     STOP taking these medications    acetaminophen -codeine 300-15 MG tablet Commonly known as: TYLENOL  #2   gabapentin  300 MG capsule Commonly known as: NEURONTIN    pregabalin  50 MG capsule Commonly known as: LYRICA        TAKE these medications    amLODipine  5 MG tablet Commonly known as: NORVASC  Take 1 tablet (5 mg total) by mouth daily.   aspirin  EC 81 MG tablet Take 1 tablet (81 mg total) by mouth daily. Swallow whole. What changed:  when to take this additional instructions   atorvastatin  80 MG tablet Commonly known as: LIPITOR  Take 1 tablet (80 mg total) by mouth daily.   clopidogrel  75 MG tablet Commonly known as: PLAVIX  Take 1 tablet by mouth daily.   dapagliflozin  propanediol 10 MG Tabs tablet Commonly known as: FARXIGA  Take 1 tablet by mouth daily.   DULoxetine  60 MG capsule Commonly known as: CYMBALTA  Take 1 capsule (60 mg total) by mouth daily. For chronic pain and  mood   ezetimibe  10 MG tablet Commonly known as: ZETIA  Take 1 tablet (10 mg total) by mouth daily.   famotidine  20 MG tablet Commonly known as: PEPCID  Take 1 tablet (20 mg total) by mouth daily before breakfast.   folic acid  1 MG tablet Commonly known as: FOLVITE  Take 1 tablet by mouth daily.   hydrOXYzine  25 MG tablet Commonly known as: ATARAX  Take 25 mg by mouth at bedtime.   insulin  glargine 100 UNIT/ML Solostar Pen Commonly known as: LANTUS  Inject 10 Units into the skin daily. Please follow up with your PCP for adjustments to your insulin  regimen What changed: how much to take   insulin  lispro 100 UNIT/ML injection Commonly known as: HUMALOG  Inject 0.04 mLs (4 Units total) into the skin 3 (three) times daily with meals. Please follow up with your PCP for adjustments of your insulin  regimen   Lancet Device Misc 1 each by Does not apply route 3 (three) times daily. May dispense any manufacturer covered by patient's insurance.   metFORMIN  500 MG tablet Commonly known as: GLUCOPHAGE  Take 1 tablet (500 mg total) by mouth 2 (two) times daily with a meal. Follow up with your PCP to discuss uptitration of this medicine   metoprolol  succinate 50 MG 24 hr tablet Commonly known as: TOPROL -XL Take 1  tablet (50 mg total) by mouth daily. Take with or immediately following a meal.   naloxone  4 MG/0.1ML Liqd nasal spray kit Commonly known as: NARCAN  Place 1 spray into the nose once. SPRAY 1 SPRAY INTO ONE NOSTRIL AS DIRECTED FOR OPIOID OVERDOSE - CALL 911 IMMEDIATELY, ADMINISTER DOSE, THEN TURN PERSON ON SIDE - IF NO RESPONSE IN 2-3 MINUTES OR PERSON RESPONDS BUT RELAPSES, REPEAT USING A NEW SPRAY DEVICE AND SPRAY INTO THE OTHER NOSTRIL   OLANZapine  5 MG tablet Commonly known as: ZYPREXA  Take 5 mg by mouth at bedtime.   tamsulosin  0.4 MG Caps capsule Commonly known as: FLOMAX  Take 0.4 mg by mouth at bedtime.   thiamine  100 MG tablet Commonly known as: Vitamin B-1 Take 1 tablet  (100 mg total) by mouth daily.   traZODone  50 MG tablet Commonly known as: DESYREL  Take 1 tablet (50 mg total) by mouth at bedtime. What changed: how much to take        Allergies  Allergen Reactions   Lisinopril Swelling and Other (See Comments)    Angioedema and the patient stated it made his lips swell  Pt reports not allergic 06/07/20    Omeprazole Swelling and Other (See Comments)    ANGIOEDEMA OF LIPS, Angioedema of tongue Pt reports not allergic 06/07/20    Consultations: None  Procedures/Studies: DG Abd 1 View Result Date: 05/14/2023 EXAM: 1 VIEW XRAY OF THE ABDOMEN SUPINE 05/14/2023 06:08:00 PM COMPARISON: None available. CLINICAL HISTORY: [id number] Abdominal pain [id number]. Reason for exam: abdominal pain. Abdominal pain. FINDINGS: BOWEL: The bowel gas pattern is nonspecific. No bowel obstruction. PERITONEUM AND SOFT TISSUES: No abnormal calcifications. BONES: No acute osseous abnormality. IMPRESSION: 1. No evidence of bowel obstruction. Electronically signed by: Zadie Herter MD 05/14/2023 08:04 PM EDT RP Workstation: [id number]   EEG adult Result Date: 05/14/2023 Arleene Lack, MD     05/14/2023  6:30 AM Patient Name: GEORDON SOLIN MRN: [id number] Epilepsy Attending: Arleene Lack Referring Physician/Provider: Doutova, Anastassia, MD Date: 05/13/2023 Duration: 26.13 mins Patient history: 68yo M with ams. EEG to evaluate for seizure Level of alertness: Awake, asleep AEDs during EEG study: None Technical aspects: This EEG study was done with scalp electrodes positioned according to the 10-20 International system of electrode placement. Electrical activity was reviewed with band pass filter of 1-70Hz , sensitivity of 7 uV/mm, display speed of 82mm/sec with a 60Hz  notched filter applied as appropriate. EEG data were recorded continuously and digitally stored.  Video monitoring was available and reviewed as appropriate. Description: The posterior dominant rhythm consists of  7Hz  activity of moderate voltage (25-35 uV) seen predominantly in posterior head regions, symmetric and reactive to eye opening and eye closing. Sleep was characterized by vertex waves, sleep spindles (12 to 14 Hz), maximal frontocentral region. EEG showed continuous generalized 6 to 7 Hz theta slowing. Hyperventilation and photic stimulation were not performed.   ABNORMALITY - Continuous slow, generalized IMPRESSION: This study is suggestive of mild to moderate diffuse encephalopathy. No seizures or epileptiform discharges were seen throughout the recording. Arleene Lack   MR BRAIN WO CONTRAST Result Date: 05/13/2023 CLINICAL DATA:  Altered mental status. EXAM: MRI HEAD WITHOUT CONTRAST TECHNIQUE: Multiplanar, multiecho pulse sequences of the brain and surrounding structures were obtained without intravenous contrast. COMPARISON:  CT head without contrast 05/13/2023. MR head without contrast 01/02/2023. FINDINGS: Brain: No acute infarct, hemorrhage, or mass lesion is present. Remote left ACA and right parietal cortical infarcts are stable. Remote PCA territory infarcts are stable  bilaterally in the medial left occipital lobe and lateral right occipital lobe. Remote ischemic changes extend into the brainstem, stable. Remote lacunar infarcts of both cerebellar hemispheres are stable. No significant extraaxial fluid collection is present. The internal auditory canals are within normal limits. Vascular: Flow is present in the major intracranial arteries. Skull and upper cervical spine: Degenerative changes are present the upper cervical spine. Craniocervical junction is normal. Marrow signal is normal. Sinuses/Orbits: The paranasal sinuses and mastoid air cells are clear. The globes and orbits are within normal limits. IMPRESSION: 1. No acute intracranial abnormality or significant interval change. 2. Stable remote left ACA and right parietal cortical infarcts. 3. Stable remote PCA territory infarcts  bilaterally. 4. Stable remote lacunar infarcts of both cerebellar hemispheres. Electronically Signed   By: Audree Leas M.D.   On: 05/13/2023 17:32   DG Chest Port 1 View Result Date: 05/13/2023 CLINICAL DATA:  Fall.  Altered mental status.  Endocarditis. EXAM: PORTABLE CHEST 1 VIEW COMPARISON:  Two-view chest x-ray 04/17/2023. FINDINGS: The heart size is normal. Patient is rotated to the right. No edema or effusion is present. No focal airspace disease is present. The visualized soft tissues bony thorax are unremarkable. IMPRESSION: No acute cardiopulmonary disease. Electronically Signed   By: Audree Leas M.D.   On: 05/13/2023 12:49   CT HEAD WO CONTRAST Result Date: 05/13/2023 CLINICAL DATA:  Mental status change with unknown cause. Neck trauma EXAM: CT HEAD WITHOUT CONTRAST CT CERVICAL SPINE WITHOUT CONTRAST TECHNIQUE: Multidetector CT imaging of the head and cervical spine was performed following the standard protocol without intravenous contrast. Multiplanar CT image reconstructions of the cervical spine were also generated. RADIATION DOSE REDUCTION: This exam was performed according to the departmental dose-optimization program which includes automated exposure control, adjustment of the mA and/or kV according to patient size and/or use of iterative reconstruction technique. COMPARISON:  07/16/2022 head CT FINDINGS: CT HEAD FINDINGS Brain: Small chronic infarcts in the bilateral cerebellum and scattered along the bilateral cerebral cortex-largest being in the parasagittal anterior left frontal lobe, right parietal cortex, and bilateral occipital cortex. No acute hemorrhage, hydrocephalus, mass, or collection. Ischemic gliosis in the cerebral white matter. Vascular: No hyperdense vessel or unexpected calcification. Skull: No acute finding Sinuses/Orbits: No visible injury, dysconjugate gaze is nonspecific. CT CERVICAL SPINE FINDINGS Alignment: Mild degenerative anterolisthesis at C3-4 and  C7-T1. Skull base and vertebrae: No acute fracture. No primary bone lesion or focal pathologic process. Soft tissues and spinal canal: No prevertebral fluid or swelling. No visible canal hematoma. Disc levels:  Generalized degenerative endplate and facet spurring. Upper chest: No evidence of injury IMPRESSION: No evidence of acute intracranial or cervical spine injury. Advanced chronic ischemic injury that is stable since 01/02/2023 brain MRI Electronically Signed   By: Ronnette Coke M.D.   On: 05/13/2023 12:43   CT CERVICAL SPINE WO CONTRAST Result Date: 05/13/2023 CLINICAL DATA:  Mental status change with unknown cause. Neck trauma EXAM: CT HEAD WITHOUT CONTRAST CT CERVICAL SPINE WITHOUT CONTRAST TECHNIQUE: Multidetector CT imaging of the head and cervical spine was performed following the standard protocol without intravenous contrast. Multiplanar CT image reconstructions of the cervical spine were also generated. RADIATION DOSE REDUCTION: This exam was performed according to the departmental dose-optimization program which includes automated exposure control, adjustment of the mA and/or kV according to patient size and/or use of iterative reconstruction technique. COMPARISON:  07/16/2022 head CT FINDINGS: CT HEAD FINDINGS Brain: Small chronic infarcts in the bilateral cerebellum and scattered along the bilateral  cerebral cortex-largest being in the parasagittal anterior left frontal lobe, right parietal cortex, and bilateral occipital cortex. No acute hemorrhage, hydrocephalus, mass, or collection. Ischemic gliosis in the cerebral white matter. Vascular: No hyperdense vessel or unexpected calcification. Skull: No acute finding Sinuses/Orbits: No visible injury, dysconjugate gaze is nonspecific. CT CERVICAL SPINE FINDINGS Alignment: Mild degenerative anterolisthesis at C3-4 and C7-T1. Skull base and vertebrae: No acute fracture. No primary bone lesion or focal pathologic process. Soft tissues and spinal  canal: No prevertebral fluid or swelling. No visible canal hematoma. Disc levels:  Generalized degenerative endplate and facet spurring. Upper chest: No evidence of injury IMPRESSION: No evidence of acute intracranial or cervical spine injury. Advanced chronic ischemic injury that is stable since 01/02/2023 brain MRI Electronically Signed   By: Ronnette Coke M.D.   On: 05/13/2023 12:43     Subjective: No acute issues or events overnight, continues to progress/advance his diet slowly, remains oriented to person and general situation only -review of systems difficult given patient's unwillingness to cooperate with interview.   Discharge Exam: Vitals:   05/19/23 1953 05/20/23 0809  BP: 98/65 128/60  Pulse: (!) 58 (!) 59  Resp: 20 16  Temp: (!) 97.4 F (36.3 C) (!) 97.4 F (36.3 C)  SpO2: 94% 100%   Vitals:   05/19/23 0800 05/19/23 1646 05/19/23 1953 05/20/23 0809  BP: 111/64 120/80 98/65 128/60  Pulse: 61 (!) 59 (!) 58 (!) 59  Resp: 18 19 20 16   Temp: (!) 97.5 F (36.4 C) 97.6 F (36.4 C) (!) 97.4 F (36.3 C) (!) 97.4 F (36.3 C)  TempSrc: Oral Oral Oral Oral  SpO2: 97% 100% 94% 100%    General: Pt is alert, awake, not in acute distress, alert to person and place, unable to orient further as patient refuses to answer questions Cardiovascular: RRR, S1/S2 +, no rubs, no gallops Respiratory: CTA bilaterally, no wheezing, no rhonchi Abdominal: Soft, NT, ND, bowel sounds + Extremities: no edema, no cyanosis    The results of significant diagnostics from this hospitalization (including imaging, microbiology, ancillary and laboratory) are listed below for reference.     Microbiology: No results found for this or any previous visit (from the past 240 hours).   Labs: BNP (last 3 results) No results for input(s): "BNP" in the last 8760 hours. Basic Metabolic Panel: Recent Labs  Lab 05/13/23 1653 05/14/23 0438 05/15/23 0618 05/16/23 0539 05/17/23 0542 05/18/23 0648  NA  138 139 137 135 135 135  K 4.5 3.9 4.0 4.2 4.1 4.3  CL 104 106 104 101 101 101  CO2 17* 18* 20* 24 26 24   GLUCOSE 122* 122* 148* 171* 206* 243*  BUN 32* 26* 14 10 11 16   CREATININE 1.34* 1.14 1.01 0.98 1.01 0.90  CALCIUM  9.0 8.4* 8.7* 8.8* 8.7* 9.1  MG 2.5* 2.3  --   --   --   --   PHOS 5.0* 4.2  --   --   --   --    Liver Function Tests: Recent Labs  Lab 05/13/23 1239 05/14/23 0438  AST 30 21  ALT 15 14  ALKPHOS 49 46  BILITOT 1.2 1.1  PROT 8.0 7.0  ALBUMIN 3.9 3.4*   No results for input(s): "LIPASE", "AMYLASE" in the last 168 hours. Recent Labs  Lab 05/13/23 1239 05/14/23 1957  AMMONIA  35 15   CBC: Recent Labs  Lab 05/13/23 1239 05/13/23 1307 05/14/23 0438 05/15/23 0837 05/16/23 0539 05/17/23 0542 05/18/23 0648  WBC 7.3  --  6.9 7.8 6.3 6.0 7.1  NEUTROABS 5.6  --   --  5.3 4.1 3.6 4.4  HGB 13.6   < > 11.6* 12.5* 13.2 13.0 13.4  HCT 43.7   < > 37.0* 40.0 41.3 39.9 41.8  MCV 87.8  --  86.4 86.8 84.6 84.4 84.1  PLT 250  --  261 296 280 279 286   < > = values in this interval not displayed.   Cardiac Enzymes: Recent Labs  Lab 05/13/23 1653 05/14/23 0946  CKTOTAL 503* 412*   CBG: Recent Labs  Lab 05/19/23 1648 05/19/23 1957 05/20/23 0040 05/20/23 0357 05/20/23 0808  GLUCAP 129* 177* 174* 186* 206*   Urinalysis    Component Value Date/Time   COLORURINE YELLOW 05/13/2023 2058   APPEARANCEUR CLEAR 05/13/2023 2058   LABSPEC 1.015 05/13/2023 2058   PHURINE 5.0 05/13/2023 2058   GLUCOSEU >=500 (A) 05/13/2023 2058   HGBUR NEGATIVE 05/13/2023 2058   BILIRUBINUR NEGATIVE 05/13/2023 2058   KETONESUR 20 (A) 05/13/2023 2058   PROTEINUR 30 (A) 05/13/2023 2058   NITRITE NEGATIVE 05/13/2023 2058   LEUKOCYTESUR NEGATIVE 05/13/2023 2058   Sepsis Labs Recent Labs  Lab 05/15/23 0837 05/16/23 0539 05/17/23 0542 05/18/23 0648  WBC 7.8 6.3 6.0 7.1   Microbiology No results found for this or any previous visit (from the past 240 hours).   Time  coordinating discharge: Over 30 minutes  SIGNED:   Haydee Lipa, DO [facility] 05/20/2023, 10:50 AM Pager   If 7PM-7AM, please contact night-coverage [email or web address]

## 2023-05-20 NOTE — TOC Transition Note (Signed)
 Transition of Care [facility]) - Discharge Note   Patient Details  Name: William Acevedo MRN: [id number] Date of Birth: 08-Apr-1955  [facility] CM/SW Contact:  Jeffory Mings, [place] Phone Number: 05/20/2023, 11:30 AM   Clinical Narrative: Pt for dc to [name] today. Spoke to [facility] who reports they received auth and are prepared to admit pt to room 506. [name] aware of dc and reports agreeable. RN provided with number for report and [name] arranged for transport. SW signing off at dc.   Paullette Boston, MSW, LCSW [phone] (coverage)        Final next level of care: Skilled Nursing Facility Barriers to Discharge: Barriers Resolved   Patient Goals and CMS Choice   CMS Medicare.gov Compare Post Acute Care list provided to:: Other (Comment Required) (brother) Choice offered to / list presented to : Sibling [place] ownership interest in [facility].provided to:: Sibling    Discharge Placement              Patient chooses bed at: [facility] Patient to be transferred to facility by: [name] Name of family member notified: [name]/brother Patient and family notified of of transfer: 05/20/23  Discharge Plan and Services Additional resources added to the After Visit Summary for   In-house Referral: Clinical Social Work                                   Social Drivers of Health (SDOH) Interventions SDOH Screenings   Food Insecurity: Patient Unable To Answer (05/13/2023)  Housing: Patient Unable To Answer (05/13/2023)  Transportation Needs: Patient Unable To Answer (05/13/2023)  Utilities: Patient Unable To Answer (05/13/2023)  Social Connections: Patient Unable To Answer (05/13/2023)  Tobacco Use: High Risk (04/17/2023)     Readmission Risk Interventions     No data to display

## 2023-06-05 ENCOUNTER — Encounter (HOSPITAL_COMMUNITY): Payer: Self-pay | Admitting: *Deleted

## 2023-06-05 ENCOUNTER — Other Ambulatory Visit: Payer: Self-pay

## 2023-06-05 ENCOUNTER — Emergency Department (HOSPITAL_COMMUNITY): Admission: EM | Admit: 2023-06-05 | Discharge: 2023-06-06 | Disposition: A | Discharge status: Skilled Nursing Facility

## 2023-06-05 DIAGNOSIS — Z7902 Long term (current) use of antithrombotics/antiplatelets: Secondary | ICD-10-CM | POA: Diagnosis not present

## 2023-06-05 DIAGNOSIS — Z794 Long term (current) use of insulin: Secondary | ICD-10-CM | POA: Diagnosis not present

## 2023-06-05 DIAGNOSIS — R339 Retention of urine, unspecified: Secondary | ICD-10-CM | POA: Diagnosis present

## 2023-06-05 DIAGNOSIS — Z7982 Long term (current) use of aspirin: Secondary | ICD-10-CM | POA: Insufficient documentation

## 2023-06-05 LAB — URINALYSIS, ROUTINE W REFLEX MICROSCOPIC
Bilirubin Urine: NEGATIVE
Glucose, UA: >=500 mg/dL — AB
Ketones, ur: 5 mg/dL — AB
Nitrite: POSITIVE — AB
Protein, ur: NEGATIVE mg/dL
Specific Gravity, Urine: 1.018 (ref 1.005–1.030)
WBC, UA: >50 WBC/hpf (ref 0–5)
pH: 5 (ref 5.0–8.0)

## 2023-06-05 LAB — CBC WITH DIFFERENTIAL/PLATELET
Abs Immature Granulocytes: 0.04 10*3/uL (ref 0.00–0.07)
Basophils Absolute: 0 10*3/uL (ref 0.0–0.1)
Basophils Relative: 0 %
Eosinophils Absolute: 0.1 10*3/uL (ref 0.0–0.5)
Eosinophils Relative: 1 %
HCT: 39.9 % (ref 39.0–52.0)
Hemoglobin: 12 g/dL — ABNORMAL LOW (ref 13.0–17.0)
Immature Granulocytes: 0 %
Lymphocytes Relative: 19 %
Lymphs Abs: 1.9 10*3/uL (ref 0.7–4.0)
MCH: 26.9 pg (ref 26.0–34.0)
MCHC: 30.1 g/dL (ref 30.0–36.0)
MCV: 89.5 fL (ref 80.0–100.0)
Monocytes Absolute: 1 10*3/uL (ref 0.1–1.0)
Monocytes Relative: 10 %
Neutro Abs: 7.2 10*3/uL (ref 1.7–7.7)
Neutrophils Relative %: 70 %
Platelets: 351 10*3/uL (ref 150–400)
RBC: 4.46 MIL/uL (ref 4.22–5.81)
RDW: 13.3 % (ref 11.5–15.5)
WBC: 10.2 10*3/uL (ref 4.0–10.5)
nRBC: 0 % (ref 0.0–0.2)

## 2023-06-05 LAB — COMPREHENSIVE METABOLIC PANEL WITH GFR
ALT: 13 U/L (ref 0–44)
AST: 18 U/L (ref 15–41)
Albumin: 3.5 g/dL (ref 3.5–5.0)
Alkaline Phosphatase: 65 U/L (ref 38–126)
Anion gap: 12 (ref 5–15)
BUN: 27 mg/dL — ABNORMAL HIGH (ref 8–23)
CO2: 23 mmol/L (ref 22–32)
Calcium: 8.9 mg/dL (ref 8.9–10.3)
Chloride: 102 mmol/L (ref 98–111)
Creatinine, Ser: 1.11 mg/dL (ref 0.61–1.24)
GFR, Estimated: >60 mL/min (ref >60)
Glucose, Bld: 125 mg/dL — ABNORMAL HIGH (ref 70–99)
Potassium: 4.4 mmol/L (ref 3.5–5.1)
Sodium: 137 mmol/L (ref 135–145)
Total Bilirubin: 0.6 mg/dL (ref 0.0–1.2)
Total Protein: 7.6 g/dL (ref 6.5–8.1)

## 2023-06-05 MED ORDER — CIPROFLOXACIN HCL 500 MG PO TABS: 500.0000 mg | ORAL_TABLET | Freq: Two times a day (BID) | ORAL | 0 refills | Status: AC

## 2023-06-05 NOTE — ED Triage Notes (Addendum)
 BIB [facility] from [facility], "at rehab s/p bad cocaine trip". Sent out for urinary retention. Was able to straight cath with 300cc out. Pt alert, NAD, calm, minimally interactive. H/o DM. VSS. CBG 101. H/o toxic encephalopathy. Alert, NAD, calm, some confusion, slow to respond.

## 2023-06-05 NOTE — ED Notes (Signed)
 Pt started kicking me while I was attempting to get vs. Pt instructed to stop that behavior.

## 2023-06-05 NOTE — Discharge Instructions (Signed)
 You have a urinary tract infection.  We are prescribing you antibiotics.  Please take them as prescribed for the full course.  You will need to follow-up with your urologist; [facility]rology in [place].  If you are unable to get into see them you may need to see urologist that we have provided Dr. Heywood Louder.  Please call to schedule appointment.  Please leave the Foley catheter in until you are seen by urology.

## 2023-06-05 NOTE — ED Provider Notes (Signed)
 [place] EMERGENCY DEPARTMENT AT [facility] Provider Note   CSN: [id number] Arrival date & time: 06/05/23  1521     History  Chief Complaint  Patient presents with   Urinary Retention    William Acevedo is a 68 y.o. male.  68 year old male presenting emergency department for urinary retention.  Coming from rehab.  In rehab secondary to recent hospitalization for encephalopathy.  At his baseline.  Has apparently been retaining urine.  Straight cath at facility with 300 cc out.  Denies pain        Home Medications Prior to Admission medications   Medication Sig Start Date End Date Taking? Authorizing Provider  ciprofloxacin  (CIPRO ) 500 MG tablet Take 1 tablet (500 mg total) by mouth every 12 (twelve) hours for 5 days. 06/05/23 06/10/23 Yes Dellamae Rosamilia, Lajean Pike, DO  amLODipine  (NORVASC ) 5 MG tablet Take 1 tablet (5 mg total) by mouth daily. 05/20/23   Haydee Lipa, MD  aspirin  EC 81 MG tablet Take 1 tablet (81 mg total) by mouth daily. Swallow whole. 05/20/23   Haydee Lipa, MD  atorvastatin  (LIPITOR ) 80 MG tablet Take 1 tablet (80 mg total) by mouth daily. 07/19/22 05/13/24  Etter Hermann., MD  clopidogrel  (PLAVIX ) 75 MG tablet Take 1 tablet by mouth daily. 08/29/22   [provider]  dapagliflozin  propanediol (FARXIGA ) 10 MG TABS tablet Take 1 tablet by mouth daily. 08/29/22   [provider]  DULoxetine  (CYMBALTA ) 60 MG capsule Take 1 capsule (60 mg total) by mouth daily. For chronic pain and mood 07/19/22 05/13/24  Etter Hermann., MD  ezetimibe  (ZETIA ) 10 MG tablet Take 1 tablet (10 mg total) by mouth daily. 07/20/22 05/13/24  Etter Hermann., MD  famotidine  (PEPCID ) 20 MG tablet Take 1 tablet (20 mg total) by mouth daily before breakfast. 07/19/22 09/17/22  Etter Hermann., MD  folic acid  (FOLVITE ) 1 MG tablet Take 1 tablet by mouth daily. 08/29/22   [provider]  hydrOXYzine  (ATARAX ) 25 MG tablet Take 25 mg by  mouth at bedtime. 04/17/23   [provider]  insulin  glargine (LANTUS ) 100 UNIT/ML Solostar Pen Inject 10 Units into the skin daily. Please follow up with your PCP for adjustments to your insulin  regimen 05/20/23   Haydee Lipa, MD  insulin  lispro (HUMALOG ) 100 UNIT/ML injection Inject 0.04 mLs (4 Units total) into the skin 3 (three) times daily with meals. Please follow up with your PCP for adjustments of your insulin  regimen 07/19/22   Etter Hermann., MD  Lancet Device MISC 1 each by Does not apply route 3 (three) times daily. May dispense any manufacturer covered by patient's insurance. 07/19/22   Etter Hermann., MD  metFORMIN  (GLUCOPHAGE ) 500 MG tablet Take 1 tablet (500 mg total) by mouth 2 (two) times daily with a meal. Follow up with your PCP to discuss uptitration of this medicine 07/19/22 08/18/22  Etter Hermann., MD  metoprolol  succinate (TOPROL -XL) 50 MG 24 hr tablet Take 1 tablet (50 mg total) by mouth daily. Take with or immediately following a meal. 07/19/22 08/18/22  Etter Hermann., MD  naloxone  (NARCAN ) nasal spray 4 mg/0.1 mL Place 1 spray into the nose once. SPRAY 1 SPRAY INTO ONE NOSTRIL AS DIRECTED FOR OPIOID OVERDOSE - CALL 911 IMMEDIATELY, ADMINISTER DOSE, THEN TURN PERSON ON SIDE - IF NO RESPONSE IN 2-3 MINUTES OR PERSON RESPONDS BUT RELAPSES, REPEAT USING A NEW SPRAY  DEVICE AND SPRAY INTO THE OTHER NOSTRIL 03/15/23   [provider]  OLANZapine  (ZYPREXA ) 5 MG tablet Take 5 mg by mouth at bedtime. 04/17/23   [provider]  tamsulosin  (FLOMAX ) 0.4 MG CAPS capsule Take 0.4 mg by mouth at bedtime. 04/17/23   [provider]  thiamine  (VITAMIN B-1) 100 MG tablet Take 1 tablet (100 mg total) by mouth daily. 05/20/23   Haydee Lipa, MD  traZODone  (DESYREL ) 50 MG tablet Take 1 tablet (50 mg total) by mouth at bedtime. 05/20/23 07/19/23  Haydee Lipa, MD      Allergies    Lisinopril and Omeprazole    Review of  Systems   Review of Systems  Physical Exam Updated Vital Signs BP 111/67 (BP Location: Left Arm)   Pulse 88   Temp 99.3 F (37.4 C) (Oral)   Resp 16   Wt 60.8 kg   SpO2 99%   BMI 21.63 kg/m  Physical Exam Vitals and nursing note reviewed.  Constitutional:      General: He is not in acute distress.    Appearance: He is not toxic-appearing.  HENT:     Head: Normocephalic.     Nose: Nose normal.     Mouth/Throat:     Mouth: Mucous membranes are moist.  Cardiovascular:     Pulses: Normal pulses.     Heart sounds: Normal heart sounds.  Pulmonary:     Effort: Pulmonary effort is normal.  Abdominal:     General: Abdomen is flat. There is no distension.     Palpations: Abdomen is soft.     Tenderness: There is no abdominal tenderness. There is no guarding or rebound.  Skin:    General: Skin is warm and dry.     Capillary Refill: Capillary refill takes less than 2 seconds.  Neurological:     Mental Status: He is alert. Mental status is at baseline.  Psychiatric:        Mood and Affect: Mood normal.        Behavior: Behavior normal.     ED Results / Procedures / Treatments   Labs (all labs ordered are listed, but only abnormal results are displayed) Labs Reviewed  CBC WITH DIFFERENTIAL/PLATELET - Abnormal; Notable for the following components:      Result Value   Hemoglobin 12.0 (*)    All other components within normal limits  COMPREHENSIVE METABOLIC PANEL WITH GFR - Abnormal; Notable for the following components:   Glucose, Bld 125 (*)    BUN 27 (*)    All other components within normal limits  URINALYSIS, ROUTINE W REFLEX MICROSCOPIC - Abnormal; Notable for the following components:   APPearance CLOUDY (*)    Glucose, UA >=500 (*)    Hgb urine dipstick MODERATE (*)    Ketones, ur 5 (*)    Nitrite POSITIVE (*)    Leukocytes,Ua LARGE (*)    Bacteria, UA RARE (*)    All other components within normal limits  URINE CULTURE    EKG None  Radiology No results  found.  Procedures Procedures    Medications Ordered in ED Medications - No data to display  ED Course/ Medical Decision Making/ A&P                                 Medical Decision Making 68 year old male presenting emergency department for urinary retention.  He is afebrile nontachycardic hemodynamically  stable.  Benign abdominal exam.  Has has baseline mentation per reports.  Per chart review on recent hospitalization did have some intermittent urinary retention for which she was straight cathed for.  And discharge summary recommending Foley catheter placement if continues to retain with urology followup with [facility] in [place].  Foley catheter placed here.  Appears to have a urinary tract infection.  He is overall well-appearing, no leukocytosis.  No metabolic derangements.  Normal kidney function.  Will place on antibiotics and have patient follow-up with urology outpatient.  Stable for discharge at this time.  Amount and/or Complexity of Data Reviewed Labs: ordered.  Risk Prescription drug management.         Final Clinical Impression(s) / ED Diagnoses Final diagnoses:  Urinary retention    Rx / DC Orders ED Discharge Orders          Ordered    ciprofloxacin  (CIPRO ) 500 MG tablet  Every 12 hours        06/05/23 2031              Rolinda Climes, DO 06/05/23 2037

## 2023-06-06 NOTE — ED Notes (Signed)
[name] arranged for pt.

## 2023-06-08 LAB — URINE CULTURE: Culture: >=100000 — AB

## 2023-06-09 ENCOUNTER — Telehealth (HOSPITAL_BASED_OUTPATIENT_CLINIC_OR_DEPARTMENT_OTHER): Payer: Self-pay

## 2023-06-09 NOTE — Telephone Encounter (Signed)
 Post ED Visit - Positive Culture Follow-up  Culture report reviewed by antimicrobial stewardship pharmacist: [facility] []  Court Distance, Pharm.D. []  Skeet Duke, Pharm.D., BCPS AQ-ID []  Leslee Rase, Pharm.D., BCPS [x]  Garland Junk, Pharm.D., BCPS []  [place], [address].D., BCPS, AAHIVP []  Alcide Aly, Pharm.D., BCPS, AAHIVP []  Jerri Morale, PharmD, BCPS []  Graham Laws, PharmD, BCPS []  Cleda Curly, PharmD, BCPS []  Tamar Fairly, PharmD []  Ballard Levels, PharmD, BCPS []  Ollen Beverage, PharmD  [facility] []  Arlyne Bering, PharmD []  Sherryle Don, PharmD []  Van Gelinas, PharmD []  Delila Felty, Rph []  [place]) Cleora Daft, PharmD []  Augustina Block, PharmD []  Arie Kurtz, PharmD []  Sharlyn Deaner, PharmD []  Agnes Hose, PharmD []  Kendall Pauls, PharmD []  Gladstone Lamer, PharmD []  Armanda Bern, PharmD []  Tera Fellows, PharmD   Positive urine culture Treated with Ciprofloxacin , organism sensitive to the same and no further patient follow-up is required at this time.  Delena Feil 06/09/2023, 9:16 AM

## 2023-06-12 ENCOUNTER — Inpatient Hospital Stay (HOSPITAL_COMMUNITY)
Admission: EM | Admit: 2023-06-12 | Discharge: 2023-06-16 | DRG: 641 | Disposition: A | Discharge status: Skilled Nursing Facility | Source: Skilled Nursing Facility | Attending: Internal Medicine | Admitting: Internal Medicine

## 2023-06-12 ENCOUNTER — Other Ambulatory Visit: Payer: Self-pay

## 2023-06-12 ENCOUNTER — Encounter (HOSPITAL_COMMUNITY): Payer: Self-pay | Admitting: Emergency Medicine

## 2023-06-12 DIAGNOSIS — E860 Dehydration: Principal | ICD-10-CM | POA: Diagnosis present

## 2023-06-12 DIAGNOSIS — A059 Bacterial foodborne intoxication, unspecified: Secondary | ICD-10-CM | POA: Diagnosis present

## 2023-06-12 DIAGNOSIS — Z79899 Other long term (current) drug therapy: Secondary | ICD-10-CM

## 2023-06-12 DIAGNOSIS — E8809 Other disorders of plasma-protein metabolism, not elsewhere classified: Secondary | ICD-10-CM | POA: Diagnosis present

## 2023-06-12 DIAGNOSIS — E871 Hypo-osmolality and hyponatremia: Secondary | ICD-10-CM | POA: Diagnosis present

## 2023-06-12 DIAGNOSIS — Z7984 Long term (current) use of oral hypoglycemic drugs: Secondary | ICD-10-CM

## 2023-06-12 DIAGNOSIS — E1142 Type 2 diabetes mellitus with diabetic polyneuropathy: Secondary | ICD-10-CM | POA: Diagnosis present

## 2023-06-12 DIAGNOSIS — F17210 Nicotine dependence, cigarettes, uncomplicated: Secondary | ICD-10-CM | POA: Diagnosis present

## 2023-06-12 DIAGNOSIS — R944 Abnormal results of kidney function studies: Secondary | ICD-10-CM | POA: Diagnosis present

## 2023-06-12 DIAGNOSIS — D75839 Thrombocytosis, unspecified: Secondary | ICD-10-CM | POA: Diagnosis present

## 2023-06-12 DIAGNOSIS — Z7902 Long term (current) use of antithrombotics/antiplatelets: Secondary | ICD-10-CM

## 2023-06-12 DIAGNOSIS — Z794 Long term (current) use of insulin: Secondary | ICD-10-CM

## 2023-06-12 DIAGNOSIS — E872 Acidosis, unspecified: Secondary | ICD-10-CM | POA: Diagnosis present

## 2023-06-12 DIAGNOSIS — A084 Viral intestinal infection, unspecified: Principal | ICD-10-CM | POA: Diagnosis present

## 2023-06-12 DIAGNOSIS — Z7982 Long term (current) use of aspirin: Secondary | ICD-10-CM

## 2023-06-12 DIAGNOSIS — Z888 Allergy status to other drugs, medicaments and biological substances status: Secondary | ICD-10-CM

## 2023-06-12 DIAGNOSIS — R112 Nausea with vomiting, unspecified: Principal | ICD-10-CM | POA: Diagnosis present

## 2023-06-12 DIAGNOSIS — N390 Urinary tract infection, site not specified: Secondary | ICD-10-CM

## 2023-06-12 DIAGNOSIS — D649 Anemia, unspecified: Secondary | ICD-10-CM | POA: Diagnosis present

## 2023-06-12 DIAGNOSIS — K5730 Diverticulosis of large intestine without perforation or abscess without bleeding: Secondary | ICD-10-CM | POA: Diagnosis present

## 2023-06-12 DIAGNOSIS — G8929 Other chronic pain: Secondary | ICD-10-CM | POA: Diagnosis present

## 2023-06-12 DIAGNOSIS — E785 Hyperlipidemia, unspecified: Secondary | ICD-10-CM | POA: Diagnosis present

## 2023-06-12 DIAGNOSIS — Z1152 Encounter for screening for COVID-19: Secondary | ICD-10-CM

## 2023-06-12 DIAGNOSIS — F419 Anxiety disorder, unspecified: Secondary | ICD-10-CM | POA: Diagnosis present

## 2023-06-12 DIAGNOSIS — Z8673 Personal history of transient ischemic attack (TIA), and cerebral infarction without residual deficits: Secondary | ICD-10-CM

## 2023-06-12 DIAGNOSIS — F32A Depression, unspecified: Secondary | ICD-10-CM | POA: Diagnosis present

## 2023-06-12 DIAGNOSIS — I10 Essential (primary) hypertension: Secondary | ICD-10-CM | POA: Diagnosis present

## 2023-06-12 DIAGNOSIS — G5600 Carpal tunnel syndrome, unspecified upper limb: Secondary | ICD-10-CM | POA: Diagnosis present

## 2023-06-12 DIAGNOSIS — F4310 Post-traumatic stress disorder, unspecified: Secondary | ICD-10-CM | POA: Diagnosis present

## 2023-06-12 DIAGNOSIS — E119 Type 2 diabetes mellitus without complications: Secondary | ICD-10-CM

## 2023-06-12 LAB — URINALYSIS, W/ REFLEX TO CULTURE (INFECTION SUSPECTED)
Bacteria, UA: NONE SEEN
Bilirubin Urine: NEGATIVE
Glucose, UA: >=500 mg/dL — AB
Ketones, ur: 80 mg/dL — AB
Leukocytes,Ua: NEGATIVE
Nitrite: NEGATIVE
Protein, ur: 100 mg/dL — AB
Specific Gravity, Urine: 1.021 (ref 1.005–1.030)
pH: 5 (ref 5.0–8.0)

## 2023-06-12 LAB — CBG MONITORING, ED: Glucose-Capillary: 263 mg/dL — ABNORMAL HIGH (ref 70–99)

## 2023-06-12 LAB — CBC WITH DIFFERENTIAL/PLATELET
Abs Immature Granulocytes: 0.04 10*3/uL (ref 0.00–0.07)
Basophils Absolute: 0 10*3/uL (ref 0.0–0.1)
Basophils Relative: 0 %
Eosinophils Absolute: 0 10*3/uL (ref 0.0–0.5)
Eosinophils Relative: 0 %
HCT: 44.9 % (ref 39.0–52.0)
Hemoglobin: 14.2 g/dL (ref 13.0–17.0)
Immature Granulocytes: 0 %
Lymphocytes Relative: 8 %
Lymphs Abs: 0.8 10*3/uL (ref 0.7–4.0)
MCH: 26.8 pg (ref 26.0–34.0)
MCHC: 31.6 g/dL (ref 30.0–36.0)
MCV: 84.7 fL (ref 80.0–100.0)
Monocytes Absolute: 0.2 10*3/uL (ref 0.1–1.0)
Monocytes Relative: 2 %
Neutro Abs: 8.6 10*3/uL — ABNORMAL HIGH (ref 1.7–7.7)
Neutrophils Relative %: 90 %
Platelets: 462 10*3/uL — ABNORMAL HIGH (ref 150–400)
RBC: 5.3 MIL/uL (ref 4.22–5.81)
RDW: 13.2 % (ref 11.5–15.5)
WBC: 9.6 10*3/uL (ref 4.0–10.5)
nRBC: 0 % (ref 0.0–0.2)

## 2023-06-12 LAB — I-STAT CG4 LACTIC ACID, ED: Lactic Acid, Venous: 2.2 mmol/L (ref 0.5–1.9)

## 2023-06-12 LAB — RESP PANEL BY RT-PCR (RSV, FLU A&B, COVID)  RVPGX2
Influenza A by PCR: NEGATIVE
Influenza B by PCR: NEGATIVE
Resp Syncytial Virus by PCR: NEGATIVE
SARS Coronavirus 2 by RT PCR: NEGATIVE

## 2023-06-12 MED ORDER — SODIUM CHLORIDE 0.9 % IV BOLUS
1000.0000 mL | Freq: Once | INTRAVENOUS | Status: AC
  Administered 2023-06-12: 1000 mL via INTRAVENOUS

## 2023-06-12 MED ORDER — ONDANSETRON HCL 4 MG/2ML IJ SOLN
4.0000 mg | Freq: Once | INTRAMUSCULAR | Status: AC
  Administered 2023-06-12: 4 mg via INTRAVENOUS
  Filled 2023-06-12: qty 2

## 2023-06-12 NOTE — ED Triage Notes (Signed)
 Pt arrive by EMS from [facility] for persistent nausea and vomiting all day today. Pt reports having more frequent BM than usual. CBG for EMs 300 BP 197/963, HR 113, 96% RA.

## 2023-06-12 NOTE — ED Provider Notes (Signed)
 [place] EMERGENCY DEPARTMENT AT [facility] Provider Note   CSN: [id number] Arrival date & time: 06/12/23  2217     History  Chief Complaint  Patient presents with   Nausea   Emesis    William Acevedo is a 68 y.o. male.  The history is provided by the patient and medical records.  Emesis Associated symptoms: abdominal pain and diarrhea    68 year old male with history of diabetes, depression, history of prior stroke, alcohol abuse, PTSD, presenting to the ED from [facility] facility for nausea, vomiting, and diarrhea.  This has been ongoing all day today.  States has not really been able to hold anything down.  He is having some worsening abdominal pain along his lower abdomen.  He tells me that there has been some GI illness going around his facility.  He denies any bloody stools or emesis.  Not currently on abx.  No prior abdominal surgeries, did have testicular torsion many years ago.  Seen in ED 06/05/22 for urinary retention and had foley placed.  UTI noted then, started on ciprofloxacin .  Home Medications Prior to Admission medications   Medication Sig Start Date End Date Taking? Authorizing Provider  amLODipine  (NORVASC ) 5 MG tablet Take 1 tablet (5 mg total) by mouth daily. 05/20/23   Haydee Lipa, MD  aspirin  EC 81 MG tablet Take 1 tablet (81 mg total) by mouth daily. Swallow whole. 05/20/23   Haydee Lipa, MD  atorvastatin  (LIPITOR ) 80 MG tablet Take 1 tablet (80 mg total) by mouth daily. 07/19/22 05/13/24  Etter Hermann., MD  clopidogrel  (PLAVIX ) 75 MG tablet Take 1 tablet by mouth daily. 08/29/22   [provider]  dapagliflozin  propanediol (FARXIGA ) 10 MG TABS tablet Take 1 tablet by mouth daily. 08/29/22   [provider]  DULoxetine  (CYMBALTA ) 60 MG capsule Take 1 capsule (60 mg total) by mouth daily. For chronic pain and mood 07/19/22 05/13/24  Etter Hermann., MD  ezetimibe  (ZETIA ) 10 MG tablet Take 1 tablet  (10 mg total) by mouth daily. 07/20/22 05/13/24  Etter Hermann., MD  famotidine  (PEPCID ) 20 MG tablet Take 1 tablet (20 mg total) by mouth daily before breakfast. 07/19/22 09/17/22  Etter Hermann., MD  folic acid  (FOLVITE ) 1 MG tablet Take 1 tablet by mouth daily. 08/29/22   [provider]  hydrOXYzine  (ATARAX ) 25 MG tablet Take 25 mg by mouth at bedtime. 04/17/23   [provider]  insulin  glargine (LANTUS ) 100 UNIT/ML Solostar Pen Inject 10 Units into the skin daily. Please follow up with your PCP for adjustments to your insulin  regimen 05/20/23   Haydee Lipa, MD  insulin  lispro (HUMALOG ) 100 UNIT/ML injection Inject 0.04 mLs (4 Units total) into the skin 3 (three) times daily with meals. Please follow up with your PCP for adjustments of your insulin  regimen 07/19/22   Etter Hermann., MD  Lancet Device MISC 1 each by Does not apply route 3 (three) times daily. May dispense any manufacturer covered by patient's insurance. 07/19/22   Etter Hermann., MD  metFORMIN  (GLUCOPHAGE ) 500 MG tablet Take 1 tablet (500 mg total) by mouth 2 (two) times daily with a meal. Follow up with your PCP to discuss uptitration of this medicine 07/19/22 08/18/22  Etter Hermann., MD  metoprolol  succinate (TOPROL -XL) 50 MG 24 hr tablet Take 1 tablet (50 mg total) by mouth daily. Take with or immediately following a meal.  07/19/22 08/18/22  Etter Hermann., MD  naloxone  (NARCAN ) nasal spray 4 mg/0.1 mL Place 1 spray into the nose once. SPRAY 1 SPRAY INTO ONE NOSTRIL AS DIRECTED FOR OPIOID OVERDOSE - CALL 911 IMMEDIATELY, ADMINISTER DOSE, THEN TURN PERSON ON SIDE - IF NO RESPONSE IN 2-3 MINUTES OR PERSON RESPONDS BUT RELAPSES, REPEAT USING A NEW SPRAY DEVICE AND SPRAY INTO THE OTHER NOSTRIL 03/15/23   [provider]  OLANZapine  (ZYPREXA ) 5 MG tablet Take 5 mg by mouth at bedtime. 04/17/23   [provider]  tamsulosin  (FLOMAX ) 0.4 MG CAPS capsule Take 0.4 mg by  mouth at bedtime. 04/17/23   [provider]  thiamine  (VITAMIN B-1) 100 MG tablet Take 1 tablet (100 mg total) by mouth daily. 05/20/23   Haydee Lipa, MD  traZODone  (DESYREL ) 50 MG tablet Take 1 tablet (50 mg total) by mouth at bedtime. 05/20/23 07/19/23  Haydee Lipa, MD      Allergies    Lisinopril and Omeprazole    Review of Systems   Review of Systems  Gastrointestinal:  Positive for abdominal pain, diarrhea, nausea and vomiting.  All other systems reviewed and are negative.   Physical Exam Updated Vital Signs BP (!) 186/92 (BP Location: Left Arm)   Pulse (!) 122   Temp 99.1 F (37.3 C) (Oral)   Resp 18   Ht 5\' 6"  (1.676 m)   Wt 61 kg   SpO2 100%   BMI 21.71 kg/m   Physical Exam Vitals and nursing note reviewed.  Constitutional:      Appearance: He is well-developed.     Comments: Warm to touch  HENT:     Head: Normocephalic and atraumatic.  Eyes:     Conjunctiva/sclera: Conjunctivae normal.     Pupils: Pupils are equal, round, and reactive to light.  Cardiovascular:     Rate and Rhythm: Regular rhythm. Tachycardia present.     Heart sounds: Normal heart sounds.     Comments: Tachy 120's during exam Pulmonary:     Effort: Pulmonary effort is normal.     Breath sounds: Normal breath sounds.  Abdominal:     General: Bowel sounds are normal.     Palpations: Abdomen is soft.  Genitourinary:    Comments: Foley catheter in place, draining yellow/bloody urine Musculoskeletal:        General: Normal range of motion.     Cervical back: Normal range of motion.  Skin:    General: Skin is warm and dry.  Neurological:     Mental Status: He is alert and oriented to person, place, and time.     ED Results / Procedures / Treatments   Labs (all labs ordered are listed, but only abnormal results are displayed) Labs Reviewed  CBC WITH DIFFERENTIAL/PLATELET - Abnormal; Notable for the following components:      Result Value   Platelets 462 (*)     Neutro Abs 8.6 (*)    All other components within normal limits  URINALYSIS, W/ REFLEX TO CULTURE (INFECTION SUSPECTED) - Abnormal; Notable for the following components:   Color, Urine STRAW (*)    Glucose, UA >=500 (*)    Hgb urine dipstick MODERATE (*)    Ketones, ur 80 (*)    Protein, ur 100 (*)    All other components within normal limits  COMPREHENSIVE METABOLIC PANEL WITH GFR - Abnormal; Notable for the following components:   CO2 21 (*)    Glucose, Bld 271 (*)  Anion gap 16 (*)    All other components within normal limits  CBG MONITORING, ED - Abnormal; Notable for the following components:   Glucose-Capillary 263 (*)    All other components within normal limits  I-STAT CG4 LACTIC ACID, ED - Abnormal; Notable for the following components:   Lactic Acid, Venous 2.2 (*)    All other components within normal limits  I-STAT CG4 LACTIC ACID, ED - Abnormal; Notable for the following components:   Lactic Acid, Venous 2.1 (*)    All other components within normal limits  RESP PANEL BY RT-PCR (RSV, FLU A&B, COVID)  RVPGX2  LIPASE, BLOOD    EKG None  Radiology CT ABDOMEN PELVIS W CONTRAST Result Date: 06/13/2023 CLINICAL DATA:  Left lower quadrant abdominal pain, nausea, vomiting, diarrhea, fever EXAM: CT ABDOMEN AND PELVIS WITH CONTRAST TECHNIQUE: Multidetector CT imaging of the abdomen and pelvis was performed using the standard protocol following bolus administration of intravenous contrast. RADIATION DOSE REDUCTION: This exam was performed according to the departmental dose-optimization program which includes automated exposure control, adjustment of the mA and/or kV according to patient size and/or use of iterative reconstruction technique. CONTRAST:  75mL OMNIPAQUE  IOHEXOL  350 MG/ML SOLN COMPARISON:  CT 07/16/2022 FINDINGS: Lower chest: No acute abnormality. Hepatobiliary: Unremarkable liver. Normal gallbladder. No biliary dilation. Pancreas: Unremarkable. Spleen: Unremarkable.  Adrenals/Urinary Tract: Normal adrenal glands. Bilateral cortical renal scarring, greater on the right. Nonobstructing right nephrolithiasis. Large amount of gas in the bladder. Foley catheter. Diffuse bladder wall thickening. Stomach/Bowel: Normal caliber large and small bowel. Colonic diverticulosis without diverticulitis. Large colonic stool burden. No bowel wall thickening. The appendix is normal.Stomach is within normal limits. Vascular/Lymphatic: Advanced aortic atherosclerotic calcification. No lymphadenopathy. Reproductive: Enlarged prostate. Other: No free intraperitoneal fluid or air. Musculoskeletal: No acute fracture. IMPRESSION: 1. Diffuse bladder wall thickening may be due to underdistention, cystitis, or obstructive uropathy. Large amount of gas and a Foley catheter in the bladder. 2. Colonic diverticulosis without diverticulitis. 3. Nonobstructing right nephrolithiasis. 4. Aortic Atherosclerosis (ICD10-I70.0). Electronically Signed   By: Rozell Cornet M.D.   On: 06/13/2023 01:29    Procedures Procedures    Medications Ordered in ED Medications  sodium chloride  0.9 % bolus 1,000 mL (0 mLs Intravenous Stopped 06/13/23 0100)  ondansetron  (ZOFRAN ) injection 4 mg (4 mg Intravenous Given 06/12/23 2243)  iohexol  (OMNIPAQUE ) 350 MG/ML injection 75 mL (75 mLs Intravenous Contrast Given 06/13/23 0120)  cefTRIAXone  (ROCEPHIN ) 1 g in sodium chloride  0.9 % 100 mL IVPB (0 g Intravenous Stopped 06/13/23 0250)  sodium chloride  0.9 % bolus 1,000 mL (0 mLs Intravenous Stopped 06/13/23 0604)  metoCLOPramide  (REGLAN ) injection 10 mg (10 mg Intravenous Given 06/13/23 0415)  acetaminophen  (TYLENOL ) suppository 650 mg (650 mg Rectal Given 06/13/23 0553)    ED Course/ Medical Decision Making/ A&P                                 Medical Decision Making Amount and/or Complexity of Data Reviewed Labs: ordered. Radiology: ordered and independent interpretation performed. ECG/medicine tests: ordered and  independent interpretation performed.  Risk OTC drugs. Prescription drug management. Decision regarding hospitalization.   68 y.o. M here with N/V/D.  States not able to tolerate PO today.  Seen here recently for urinary retention and had foley placed.  Also had UTI and d/c back with ciprofloxacin .    He is afebrile, non-toxic.  He is tachycardic.  BP is stable.  Foley  with good output, seems a bit blood tinged.  Has some lower abdominal tenderness on exam but he is not peritoneal.  Plan for labs, UA, lactate, CT.  Given IVF, zofran .  Labs as above-- lactate 2.2.  normal WBC count.  No significant electrolyte derangement.  UA with moderate blood, 80 ketones.  Suspect some dehydration.  CT with bladder wall thickening.  RVP is negative.    Urine culture from 06/05/23 did grow out E. Coli, was sensitive to cipro  given at prior visit.  UA today appears improved but given findings on CT.  Foley was just changed yesterday 06/12/23 per [facility] at bedside.  This may account for some of the gas seen on CT.  He is still tachycardic, slightly improved from time of arrival.  3:33 AM Attempted PO trial, projectile vomited.  Ordered additional IVF and antiemetics.  4:47 AM Vomited again after additional PO trial.  He will require admission for ongoing hydration since unable to tolerate PO.  Still tachycardic despite 2nd liter of fluids as well.  Will continue IVF.  Also now has low grade fever of 100.32F which may be contributing to tachycardia..  Fever may be due to UTI +/- viral etiology with N/V/D.  Given rectal tylenol  as not tolerating PO.   Patient is currently on abx but no active diarrhea here so I doubt C. Diff.  Patient assigned to Dr. Andy Bannister overnight-- will be a carryover admission for day team.  Final Clinical Impression(s) / ED Diagnoses Final diagnoses:  Nausea and vomiting, unspecified vomiting type  Urinary tract infection associated with indwelling urethral catheter, initial encounter  [facility])    Rx / DC Orders ED Discharge Orders     None         Coretha Dew, PA-C 06/13/23 4540    Deatra Face, MD 06/17/23 [phone]

## 2023-06-13 ENCOUNTER — Emergency Department (HOSPITAL_COMMUNITY)

## 2023-06-13 DIAGNOSIS — R112 Nausea with vomiting, unspecified: Secondary | ICD-10-CM | POA: Diagnosis not present

## 2023-06-13 LAB — CBC
HCT: 37.6 % — ABNORMAL LOW (ref 39.0–52.0)
Hemoglobin: 11.9 g/dL — ABNORMAL LOW (ref 13.0–17.0)
MCH: 26.6 pg (ref 26.0–34.0)
MCHC: 31.6 g/dL (ref 30.0–36.0)
MCV: 84.1 fL (ref 80.0–100.0)
Platelets: 435 10*3/uL — ABNORMAL HIGH (ref 150–400)
RBC: 4.47 MIL/uL (ref 4.22–5.81)
RDW: 13.5 % (ref 11.5–15.5)
WBC: 9.2 10*3/uL (ref 4.0–10.5)
nRBC: 0 % (ref 0.0–0.2)

## 2023-06-13 LAB — RESPIRATORY PANEL BY PCR

## 2023-06-13 LAB — COMPREHENSIVE METABOLIC PANEL WITH GFR
ALT: 16 U/L (ref 0–44)
AST: 19 U/L (ref 15–41)
Albumin: 3.6 g/dL (ref 3.5–5.0)
Alkaline Phosphatase: 60 U/L (ref 38–126)
Anion gap: 16 — ABNORMAL HIGH (ref 5–15)
BUN: 20 mg/dL (ref 8–23)
CO2: 21 mmol/L — ABNORMAL LOW (ref 22–32)
Calcium: 9.1 mg/dL (ref 8.9–10.3)
Chloride: 107 mmol/L (ref 98–111)
Creatinine, Ser: 1.18 mg/dL (ref 0.61–1.24)
GFR, Estimated: >60 mL/min (ref >60)
Glucose, Bld: 271 mg/dL — ABNORMAL HIGH (ref 70–99)
Potassium: 4.3 mmol/L (ref 3.5–5.1)
Sodium: 144 mmol/L (ref 135–145)
Total Bilirubin: 0.8 mg/dL (ref 0.0–1.2)
Total Protein: 8 g/dL (ref 6.5–8.1)

## 2023-06-13 LAB — I-STAT CG4 LACTIC ACID, ED: Lactic Acid, Venous: 2.1 mmol/L (ref 0.5–1.9)

## 2023-06-13 LAB — GLUCOSE, CAPILLARY
Glucose-Capillary: 213 mg/dL — ABNORMAL HIGH (ref 70–99)
Glucose-Capillary: 187 mg/dL — ABNORMAL HIGH (ref 70–99)
Glucose-Capillary: 185 mg/dL — ABNORMAL HIGH (ref 70–99)

## 2023-06-13 LAB — CREATININE, SERUM
Creatinine, Ser: 1.26 mg/dL — ABNORMAL HIGH (ref 0.61–1.24)
GFR, Estimated: >60 mL/min (ref >60)

## 2023-06-13 LAB — LIPASE, BLOOD: Lipase: 20 U/L (ref 11–51)

## 2023-06-13 MED ORDER — AMLODIPINE BESYLATE 5 MG PO TABS
5.0000 mg | ORAL_TABLET | Freq: Every day | ORAL | Status: DC
  Administered 2023-06-13 – 2023-06-16 (×4): 5 mg via ORAL
  Filled 2023-06-13 (×4): qty 1

## 2023-06-13 MED ORDER — TAMSULOSIN HCL 0.4 MG PO CAPS
0.4000 mg | ORAL_CAPSULE | Freq: Every day | ORAL | Status: DC
  Administered 2023-06-13 – 2023-06-15 (×3): 0.4 mg via ORAL
  Filled 2023-06-13 (×3): qty 1

## 2023-06-13 MED ORDER — CHLORHEXIDINE GLUCONATE CLOTH 2 % EX PADS
6.0000 | MEDICATED_PAD | Freq: Every day | CUTANEOUS | Status: DC
  Administered 2023-06-13 – 2023-06-16 (×4): 6 via TOPICAL

## 2023-06-13 MED ORDER — ONDANSETRON HCL 4 MG/2ML IJ SOLN: 4.0000 mg | Freq: Three times a day (TID) | INTRAMUSCULAR | Status: DC | PRN

## 2023-06-13 MED ORDER — ASPIRIN 81 MG PO TBEC
81.0000 mg | DELAYED_RELEASE_TABLET | Freq: Every day | ORAL | Status: DC
  Administered 2023-06-13 – 2023-06-16 (×4): 81 mg via ORAL
  Filled 2023-06-13 (×4): qty 1

## 2023-06-13 MED ORDER — FAMOTIDINE 20 MG PO TABS
20.0000 mg | ORAL_TABLET | Freq: Every day | ORAL | Status: DC
  Administered 2023-06-13 – 2023-06-16 (×3): 20 mg via ORAL
  Filled 2023-06-13 (×4): qty 1

## 2023-06-13 MED ORDER — THIAMINE MONONITRATE 100 MG PO TABS
100.0000 mg | ORAL_TABLET | Freq: Every day | ORAL | Status: DC
  Administered 2023-06-13 – 2023-06-16 (×4): 100 mg via ORAL
  Filled 2023-06-13 (×4): qty 1

## 2023-06-13 MED ORDER — FOLIC ACID 1 MG PO TABS
1.0000 mg | ORAL_TABLET | Freq: Every day | ORAL | Status: DC
  Administered 2023-06-13 – 2023-06-16 (×4): 1 mg via ORAL
  Filled 2023-06-13 (×4): qty 1

## 2023-06-13 MED ORDER — CLOPIDOGREL BISULFATE 75 MG PO TABS
75.0000 mg | ORAL_TABLET | Freq: Every day | ORAL | Status: DC
  Administered 2023-06-13 – 2023-06-16 (×4): 75 mg via ORAL
  Filled 2023-06-13 (×4): qty 1

## 2023-06-13 MED ORDER — SODIUM CHLORIDE 0.9 % IV SOLN
1.0000 g | Freq: Once | INTRAVENOUS | Status: AC
  Administered 2023-06-13: 1 g via INTRAVENOUS
  Filled 2023-06-13: qty 10

## 2023-06-13 MED ORDER — ATORVASTATIN CALCIUM 80 MG PO TABS
80.0000 mg | ORAL_TABLET | Freq: Every day | ORAL | Status: DC
  Administered 2023-06-13 – 2023-06-16 (×4): 80 mg via ORAL
  Filled 2023-06-13 (×4): qty 1

## 2023-06-13 MED ORDER — INSULIN ASPART 100 UNIT/ML IJ SOLN
0.0000 [IU] | Freq: Three times a day (TID) | INTRAMUSCULAR | Status: DC
  Administered 2023-06-13: 5 [IU] via SUBCUTANEOUS
  Administered 2023-06-13: 3 [IU] via SUBCUTANEOUS
  Administered 2023-06-14: 2 [IU] via SUBCUTANEOUS
  Administered 2023-06-14: 3 [IU] via SUBCUTANEOUS
  Administered 2023-06-14: 2 [IU] via SUBCUTANEOUS
  Administered 2023-06-15: 3 [IU] via SUBCUTANEOUS
  Administered 2023-06-15 (×2): 5 [IU] via SUBCUTANEOUS
  Administered 2023-06-16: 8 [IU] via SUBCUTANEOUS
  Administered 2023-06-16: 3 [IU] via SUBCUTANEOUS

## 2023-06-13 MED ORDER — METOPROLOL SUCCINATE ER 50 MG PO TB24
50.0000 mg | ORAL_TABLET | Freq: Every day | ORAL | Status: DC
  Administered 2023-06-13 – 2023-06-16 (×4): 50 mg via ORAL
  Filled 2023-06-13 (×4): qty 1

## 2023-06-13 MED ORDER — METOCLOPRAMIDE HCL 5 MG/ML IJ SOLN
10.0000 mg | INTRAMUSCULAR | Status: AC
Start: 2023-06-13 — End: 2023-06-13
  Administered 2023-06-13: 10 mg via INTRAVENOUS
  Filled 2023-06-13: qty 2

## 2023-06-13 MED ORDER — INSULIN GLARGINE-YFGN 100 UNIT/ML ~~LOC~~ SOLN
10.0000 [IU] | Freq: Every day | SUBCUTANEOUS | Status: DC
  Administered 2023-06-13 – 2023-06-15 (×3): 10 [IU] via SUBCUTANEOUS
  Filled 2023-06-13 (×6): qty 0.1

## 2023-06-13 MED ORDER — ACETAMINOPHEN 650 MG RE SUPP
650.0000 mg | Freq: Once | RECTAL | Status: AC
  Administered 2023-06-13: 650 mg via RECTAL
  Filled 2023-06-13: qty 1

## 2023-06-13 MED ORDER — TRAZODONE HCL 50 MG PO TABS
50.0000 mg | ORAL_TABLET | Freq: Every day | ORAL | Status: DC
  Administered 2023-06-13 – 2023-06-15 (×3): 50 mg via ORAL
  Filled 2023-06-13 (×3): qty 1

## 2023-06-13 MED ORDER — SODIUM CHLORIDE 0.9 % IV BOLUS
1000.0000 mL | Freq: Once | INTRAVENOUS | Status: AC
  Administered 2023-06-13: 1000 mL via INTRAVENOUS

## 2023-06-13 MED ORDER — ACETAMINOPHEN 325 MG PO TABS: 650.0000 mg | ORAL_TABLET | Freq: Once | ORAL | Status: DC

## 2023-06-13 MED ORDER — EZETIMIBE 10 MG PO TABS
10.0000 mg | ORAL_TABLET | Freq: Every day | ORAL | Status: DC
  Administered 2023-06-13 – 2023-06-16 (×4): 10 mg via ORAL
  Filled 2023-06-13 (×4): qty 1

## 2023-06-13 MED ORDER — OLANZAPINE 5 MG PO TABS
5.0000 mg | ORAL_TABLET | Freq: Every day | ORAL | Status: DC
  Administered 2023-06-13 – 2023-06-15 (×3): 5 mg via ORAL
  Filled 2023-06-13 (×4): qty 1

## 2023-06-13 MED ORDER — SODIUM CHLORIDE 0.9 % IV SOLN
1.0000 g | INTRAVENOUS | Status: AC
  Administered 2023-06-14 – 2023-06-15 (×2): 1 g via INTRAVENOUS
  Filled 2023-06-13 (×2): qty 10

## 2023-06-13 MED ORDER — LACTATED RINGERS IV SOLN: INTRAVENOUS | Status: AC

## 2023-06-13 MED ORDER — IOHEXOL 350 MG/ML SOLN
75.0000 mL | Freq: Once | INTRAVENOUS | Status: AC | PRN
  Administered 2023-06-13: 75 mL via INTRAVENOUS

## 2023-06-13 MED ORDER — DULOXETINE HCL 60 MG PO CPEP
60.0000 mg | ORAL_CAPSULE | Freq: Every day | ORAL | Status: DC
  Administered 2023-06-13 – 2023-06-16 (×4): 60 mg via ORAL
  Filled 2023-06-13 (×4): qty 1

## 2023-06-13 MED ORDER — ENOXAPARIN SODIUM 40 MG/0.4ML IJ SOSY
40.0000 mg | PREFILLED_SYRINGE | INTRAMUSCULAR | Status: DC
  Administered 2023-06-13 – 2023-06-16 (×4): 40 mg via SUBCUTANEOUS
  Filled 2023-06-13 (×4): qty 0.4

## 2023-06-13 NOTE — ED Notes (Signed)
Pt given something to drink.  °

## 2023-06-13 NOTE — ED Notes (Signed)
 Awaiting verification for 0830 meds

## 2023-06-13 NOTE — ED Notes (Signed)
 Patient transported to CT

## 2023-06-13 NOTE — TOC Initial Note (Signed)
 Transition of Care [facility]) - Initial/Assessment Note    Patient Details  Name: William Acevedo MRN: [id number] Date of Birth: March 08, 1955  Transition of Care [facility] CM/SW Contact:    Juliane Och, LCSW Phone Number: 06/13/2023, 9:57 AM  Clinical Narrative:                  9:57 AM Per chart review, patient is from [name]. SNF admissions confirmed patient admitted to [facility] 05/03 and is able to return upon discharge. TOC will continue to follow for disposition needs.  Expected Discharge Plan: Skilled Nursing Facility Barriers to Discharge: Continued Medical Work up   Patient Goals and CMS Choice            Expected Discharge Plan and Services In-house Referral: Clinical Social Work   Post Acute Care Choice: Skilled Nursing Facility Living arrangements for the past 2 months: Skilled Nursing Facility                                      Prior Living Arrangements/Services Living arrangements for the past 2 months: Skilled Nursing Facility Lives with:: Facility Resident Patient language and need for interpreter reviewed:: Yes              Criminal Activity/Legal Involvement Pertinent to Current Situation/Hospitalization: No - Comment as needed  Activities of Daily Living      Permission Sought/Granted Permission sought to share information with : Family Supports, Oceanographer granted to share information with : No (Contact information on chart)  Share Information with NAME: Rayce Brahmbhatt  Permission granted to share info w AGENCY: [name]  Permission granted to share info w Relationship: Brother  Permission granted to share info w Contact Information: [phone]  Emotional Assessment Appearance:: Appears stated age       Alcohol / Substance Use: Not Applicable Psych Involvement: No (comment)  Admission diagnosis:  Intractable nausea and vomiting [R11.2] Urinary tract infection  associated with indwelling urethral catheter, initial encounter (HCC) [W29.562Z, N39.0] Nausea and vomiting, unspecified vomiting type [R11.2] Patient Active Problem List   Diagnosis Date Noted   Intractable nausea and vomiting 06/13/2023   Avoid 05/13/2023   Insulin  dependent type 2 diabetes mellitus (HCC) 07/16/2022   Schizoaffective disorder (HCC) 11/10/2021   Alcohol use, unspecified with intoxication, unspecified (HCC) 11/10/2021   Cocaine abuse with intoxication, unspecified (HCC) 11/10/2021   Antisocial personality disorder (HCC) 11/10/2021   Cannabis dependence, continuous abuse (HCC) 11/10/2021   Opioid abuse (HCC) 11/10/2021   Post-traumatic stress disorder, chronic 11/10/2021   Schizophrenia, unspecified (HCC) 11/10/2021   CVA (cerebral vascular accident) (HCC) 07/09/2020   Acute ischemic multifocal multiple vascular territories stroke (HCC) 06/06/2020   Seizure-like activity (HCC) 06/06/2020   Cocaine abuse (HCC)    ARF (acute renal failure) (HCC) 04/14/2020   Acute blood loss anemia 04/14/2020   GI bleed 04/14/2020   Nausea & vomiting 04/14/2020   Acute metabolic encephalopathy 04/14/2020   Hypertensive urgency 04/14/2020   Abdominal pain 04/02/2020   Protein-calorie malnutrition, severe 03/27/2020   Cerebral thrombosis with cerebral infarction 03/26/2020   NSTEMI (non-ST elevated myocardial infarction) (HCC) 03/25/2020   Secondary diabetes mellitus with HHNC (hyperglycemia hyperosmolar non-ketotic coma) (HCC) 03/25/2020   Elevated troponin    Renal infarction [facility])    Cerebrovascular accident (CVA) (HCC)    Cocaine use disorder, severe, dependence (HCC) 04/08/2019   Major depressive disorder 04/08/2019  Epiglottitis 04/07/2019   Cocaine use 04/07/2019   Acute encephalopathy 04/07/2019   Overdose 07/06/2015   AKI (acute kidney injury) (HCC) 07/06/2015   Polysubstance abuse (HCC) 07/06/2015   DM2 (diabetes mellitus, type 2) (HCC) 07/06/2015   Dehydration    MDD  (major depressive disorder), recurrent episode, mild (HCC)    Alcohol use disorder    PCP:  Maryellen Snare, NP Pharmacy:   [facility] - [place], [place] - [address] [place] [address] Phone: [phone] Fax: [phone]     Social Drivers of Health (SDOH) Social History: SDOH Screenings   Food Insecurity: Patient Unable To Answer (05/13/2023)  Housing: Patient Unable To Answer (05/13/2023)  Transportation Needs: Patient Unable To Answer (05/13/2023)  Utilities: Patient Unable To Answer (05/13/2023)  Social Connections: Patient Unable To Answer (05/13/2023)  Tobacco Use: High Risk (06/12/2023)   SDOH Interventions:     Readmission Risk Interventions     No data to display

## 2023-06-13 NOTE — H&P (Addendum)
 History and Physical    Patient: William Acevedo:[id number] DOB: [birth year] DOA: 06/12/2023 DOS: the patient was seen and examined on 06/13/2023 PCP: Maryellen Snare, NP  Patient coming from: SNF  Chief Complaint:  Chief Complaint  Patient presents with   Nausea   Emesis   HPI: William Acevedo is a 68 y.o. male with medical history significant of HTN, HLD, DM2, CVA, PTSD, and cocaine use p/w intractable n/v.  Pt unable to provide coherent medical history. [facility] not answering. From what I can gather by reviewing the chart, the patient was admitted for n/v/d x1 day. Pt reported to ED provider that "[he] has not really been able to hold anything down. He is having some worsening abdominal pain along his lower abdomen. He tells me that there has been some GI illness going around his facility.  He denies any bloody stools or emesis. Not currently on abx." Of note, pt recently treated for urinary retention and UTI with foley placement and initiation of PO ciprofloxacin  on 06/05/2023.  In the ED, pt hypertensive. Labs notable for lactate 2.2 and Cr 1.18. CT abd showed diffuse bladder wall thickening and colonic diverticulosis. Pt admitted to medicine for observation.  Review of Systems: As mentioned in the history of present illness. All other systems reviewed and are negative. Past Medical History:  Diagnosis Date   Carpal tunnel syndrome    Cocaine use    Depression    Diabetes mellitus without complication (HCC)    Endocarditis    ETOH abuse    Hypertensive urgency 04/14/2020   Neuropathy    PTSD (post-traumatic stress disorder)    Stroke (cerebrum) (HCC)    multiple   Zoster    Past Surgical History:  Procedure Laterality Date   BUBBLE STUDY  03/30/2020   Procedure: BUBBLE STUDY;  Surgeon: Hugh Madura, MD;  Location: [facility] ENDOSCOPY;  Service: Cardiovascular;;   TEE WITHOUT CARDIOVERSION N/A 03/30/2020   Procedure: TRANSESOPHAGEAL ECHOCARDIOGRAM (TEE);   Surgeon: Hugh Madura, MD;  Location: [facility] ENDOSCOPY;  Service: Cardiovascular;  Laterality: N/A;   TESTICLE TORSION REDUCTION     Social History:  reports that he has been smoking cigarettes. He has never used smokeless tobacco. He reports current alcohol use of about 7.0 standard drinks of alcohol per week. He reports that he does not currently use drugs after having used the following drugs: Cocaine.  Allergies  Allergen Reactions   Lisinopril Swelling and Other (See Comments)    Angioedema and the patient stated it made his lips swell  Pt reports not allergic 06/07/20    Omeprazole Swelling and Other (See Comments)    ANGIOEDEMA OF LIPS, Angioedema of tongue Pt reports not allergic 06/07/20    History reviewed. No pertinent family history.  Prior to Admission medications   Medication Sig Start Date End Date Taking? Authorizing Provider  amLODipine  (NORVASC ) 5 MG tablet Take 1 tablet (5 mg total) by mouth daily. 05/20/23  Yes Haydee Lipa, MD  aspirin  EC 81 MG tablet Take 1 tablet (81 mg total) by mouth daily. Swallow whole. 05/20/23  Yes Haydee Lipa, MD  atorvastatin  (LIPITOR ) 80 MG tablet Take 1 tablet (80 mg total) by mouth daily. 07/19/22 05/13/24 Yes Etter Hermann., MD  clopidogrel  (PLAVIX ) 75 MG tablet Take 1 tablet by mouth daily. 08/29/22  Yes [provider]  dapagliflozin  propanediol (FARXIGA ) 10 MG TABS tablet Take 1 tablet by mouth daily. 08/29/22  Yes [provider]  DULoxetine  (CYMBALTA ) 60 MG capsule Take 1 capsule (60 mg total) by mouth daily. For chronic pain and mood 07/19/22 05/13/24 Yes Etter Hermann., MD  ezetimibe  (ZETIA ) 10 MG tablet Take 1 tablet (10 mg total) by mouth daily. 07/20/22 05/13/24 Yes Etter Hermann., MD  famotidine  (PEPCID ) 20 MG tablet Take 1 tablet (20 mg total) by mouth daily before breakfast. 07/19/22 06/12/24 Yes Etter Hermann., MD  folic acid  (FOLVITE ) 1 MG tablet Take 1 tablet by mouth daily.  08/29/22  Yes [provider]  hydrOXYzine  (ATARAX ) 25 MG tablet Take 25 mg by mouth at bedtime. 04/17/23  Yes [provider]  insulin  glargine (LANTUS ) 100 UNIT/ML Solostar Pen Inject 10 Units into the skin daily. Please follow up with your PCP for adjustments to your insulin  regimen Patient taking differently: Inject 20 Units into the skin at bedtime. Please follow up with your PCP for adjustments to your insulin  regimen 05/20/23  Yes Haydee Lipa, MD  insulin  lispro (HUMALOG ) 100 UNIT/ML injection Inject 0.04 mLs (4 Units total) into the skin 3 (three) times daily with meals. Please follow up with your PCP for adjustments of your insulin  regimen Patient taking differently: Inject 4 Units into the skin 3 (three) times daily with meals. Also given per sliding scale, if 151-200= 2, 201-250= 4, 251-300= 6, 301-350= 8, 351-400= 8, if BG >400, give 10 units Please follow up with your PCP for adjustments of your insulin  regimen 07/19/22  Yes Etter Hermann., MD  metFORMIN  (GLUCOPHAGE ) 500 MG tablet Take 1 tablet (500 mg total) by mouth 2 (two) times daily with a meal. Follow up with your PCP to discuss uptitration of this medicine Patient taking differently: Take 500-1,000 mg by mouth See admin instructions. Give 2 tablet by mouth in the morning and 1 tablet at bedtime Follow up with your PCP to discuss uptitration of this medicine 07/19/22 06/12/24 Yes Etter Hermann., MD  metoprolol  succinate (TOPROL -XL) 50 MG 24 hr tablet Take 1 tablet (50 mg total) by mouth daily. Take with or immediately following a meal. 07/19/22 06/12/24 Yes Etter Hermann., MD  OLANZapine  (ZYPREXA ) 5 MG tablet Take 5 mg by mouth at bedtime. 04/17/23  Yes [provider]  tamsulosin  (FLOMAX ) 0.4 MG CAPS capsule Take 0.4 mg by mouth at bedtime. 04/17/23  Yes [provider]  thiamine  (VITAMIN B-1) 100 MG tablet Take 1 tablet (100 mg total) by mouth daily. 05/20/23  Yes Haydee Lipa, MD  traZODone  (DESYREL ) 50 MG tablet Take 1 tablet (50 mg total) by mouth at bedtime. 05/20/23 07/19/23 Yes Haydee Lipa, MD  Lancet Device MISC 1 each by Does not apply route 3 (three) times daily. May dispense any manufacturer covered by patient's insurance. 07/19/22   Etter Hermann., MD  naloxone  (NARCAN ) nasal spray 4 mg/0.1 mL Place 1 spray into the nose once. SPRAY 1 SPRAY INTO ONE NOSTRIL AS DIRECTED FOR OPIOID OVERDOSE - CALL 911 IMMEDIATELY, ADMINISTER DOSE, THEN TURN PERSON ON SIDE - IF NO RESPONSE IN 2-3 MINUTES OR PERSON RESPONDS BUT RELAPSES, REPEAT USING A NEW SPRAY DEVICE AND SPRAY INTO THE OTHER NOSTRIL Patient not taking: Reported on 06/13/2023 03/15/23   [provider]    Physical Exam: Vitals:   06/13/23 0250 06/13/23 0414 06/13/23 0604 06/13/23 0757  BP: (!) 170/95  (!) 165/97   Pulse: (!) 118  (!) 102   Resp: 14  15   Temp:  100.2 F (37.9 C)  98.1 F (36.7 C)  TempSrc:  Oral  Oral  SpO2: 100%  100%   Weight:      Height:       General: Alert, oriented x3, resting comfortably in no acute distress HEENT: EOMI, oropharynx clear, moist mucous membranes, hearing intact Neck: Trachea midline and no gross thyromegaly Respiratory: Lungs clear to auscultation bilaterally with normal respiratory effort; no w/r/r Cardiovascular: Regular rate and rhythm w/o m/r/g Abdomen: Soft, nontender, nondistended. Positive bowel sounds MSK: No obvious joint deformities or swelling Skin: No obvious rashes or lesions Neurologic: Awake, alert, spontaneously moves all extremities, strength intact Psychiatric: Appropriate mood and affect, conversational and cooperative  Data Reviewed:  Lab Results  Component Value Date   WBC 9.6 06/12/2023   HGB 14.2 06/12/2023   HCT 44.9 06/12/2023   MCV 84.7 06/12/2023   PLT 462 (H) 06/12/2023   Lab Results  Component Value Date   GLUCOSE 271 (H) 06/12/2023   CALCIUM  9.1 06/12/2023   NA 144 06/12/2023   K 4.3  06/12/2023   CO2 21 (L) 06/12/2023   CL 107 06/12/2023   BUN 20 06/12/2023   CREATININE 1.18 06/12/2023   Lab Results  Component Value Date   ALT 16 06/12/2023   AST 19 06/12/2023   ALKPHOS 60 06/12/2023   BILITOT 0.8 06/12/2023   Lab Results  Component Value Date   INR 1.0 01/02/2023   INR 1.0 07/16/2022    Radiology: CT ABDOMEN PELVIS W CONTRAST Result Date: 06/13/2023 CLINICAL DATA:  Left lower quadrant abdominal pain, nausea, vomiting, diarrhea, fever EXAM: CT ABDOMEN AND PELVIS WITH CONTRAST TECHNIQUE: Multidetector CT imaging of the abdomen and pelvis was performed using the standard protocol following bolus administration of intravenous contrast. RADIATION DOSE REDUCTION: This exam was performed according to the departmental dose-optimization program which includes automated exposure control, adjustment of the mA and/or kV according to patient size and/or use of iterative reconstruction technique. CONTRAST:  75mL OMNIPAQUE  IOHEXOL  350 MG/ML SOLN COMPARISON:  CT 07/16/2022 FINDINGS: Lower chest: No acute abnormality. Hepatobiliary: Unremarkable liver. Normal gallbladder. No biliary dilation. Pancreas: Unremarkable. Spleen: Unremarkable. Adrenals/Urinary Tract: Normal adrenal glands. Bilateral cortical renal scarring, greater on the right. Nonobstructing right nephrolithiasis. Large amount of gas in the bladder. Foley catheter. Diffuse bladder wall thickening. Stomach/Bowel: Normal caliber large and small bowel. Colonic diverticulosis without diverticulitis. Large colonic stool burden. No bowel wall thickening. The appendix is normal.Stomach is within normal limits. Vascular/Lymphatic: Advanced aortic atherosclerotic calcification. No lymphadenopathy. Reproductive: Enlarged prostate. Other: No free intraperitoneal fluid or air. Musculoskeletal: No acute fracture. IMPRESSION: 1. Diffuse bladder wall thickening may be due to underdistention, cystitis, or obstructive uropathy. Large amount of  gas and a Foley catheter in the bladder. 2. Colonic diverticulosis without diverticulitis. 3. Nonobstructing right nephrolithiasis. 4. Aortic Atherosclerosis (ICD10-I70.0). Electronically Signed   By: Rozell Cornet M.D.   On: 06/13/2023 01:29    Assessment and Plan: 51M h/o HTN, HLD, DM2, CVA, PTSD, and cocaine use p/w intractable n/v.  Intractable n/v Elevated lactic acid Possible CAUTI Pt with n/v/d and elevated lactic acid likely reflecting dehydration. -IV CTX 1g daily for now -MIVF: LR at 100cc/h for 24h -IV zofran  prn -F/u urine culture after foley exchange; consider d/c foley altogether and voiding trial prior to d/c -F/u RVP -F/u GIP -F/u lactic acid  H/o HTN -PTA amlodipine  5mg  daily  H/o HLD -PTA atorvastatin  80mg  daily  H/o DM2 -PTA long acting insuling 10U at bedtime + SSI TID [facility]  H/o CVA -PTA ASA, plavix , and atorvastatin   H/o PTSD -PTA duloxetine , hydroxyzine , olanzapine , and trazodone .   Advance Care Planning:   Code Status: Full Code   Consults: N/A  Family Communication: N/A  Severity of Illness: The appropriate patient status for this patient is OBSERVATION. Observation status is judged to be reasonable and necessary in order to provide the required intensity of service to ensure the patient's safety. The patient's presenting symptoms, physical exam findings, and initial radiographic and laboratory data in the context of their medical condition is felt to place them at decreased risk for further clinical deterioration. Furthermore, it is anticipated that the patient will be medically stable for discharge from the hospital within 2 midnights of admission.    ------- I spent 55 minutes reviewing previous labs/notes, obtaining separate history at the bedside, counseling/discussing the treatment plan outlined above, ordering medications/tests, and performing clinical documentation.  Author: Arne Langdon, MD 06/13/2023 8:17 AM  For on call review  [email or web address].

## 2023-06-13 NOTE — Care Management (Signed)
 Patient encounter reported through [place] portal. Confirmation number [id number]

## 2023-06-13 NOTE — Plan of Care (Signed)

## 2023-06-14 DIAGNOSIS — R112 Nausea with vomiting, unspecified: Secondary | ICD-10-CM

## 2023-06-14 DIAGNOSIS — E860 Dehydration: Principal | ICD-10-CM

## 2023-06-14 LAB — COMPREHENSIVE METABOLIC PANEL WITH GFR
ALT: 14 U/L (ref 0–44)
AST: 18 U/L (ref 15–41)
Albumin: 3.2 g/dL — ABNORMAL LOW (ref 3.5–5.0)
Alkaline Phosphatase: 51 U/L (ref 38–126)
Anion gap: 7 (ref 5–15)
BUN: 17 mg/dL (ref 8–23)
CO2: 25 mmol/L (ref 22–32)
Calcium: 8.3 mg/dL — ABNORMAL LOW (ref 8.9–10.3)
Chloride: 103 mmol/L (ref 98–111)
Creatinine, Ser: 0.95 mg/dL (ref 0.61–1.24)
GFR, Estimated: >60 mL/min (ref >60)
Glucose, Bld: 183 mg/dL — ABNORMAL HIGH (ref 70–99)
Potassium: 3.7 mmol/L (ref 3.5–5.1)
Sodium: 135 mmol/L (ref 135–145)
Total Bilirubin: 0.8 mg/dL (ref 0.0–1.2)
Total Protein: 7 g/dL (ref 6.5–8.1)

## 2023-06-14 LAB — RETICULOCYTES
Immature Retic Fract: 12.8 % (ref 2.3–15.9)
RBC.: 4.36 MIL/uL (ref 4.22–5.81)
Retic Count, Absolute: 46.2 10*3/uL (ref 19.0–186.0)
Retic Ct Pct: 1.1 % (ref 0.4–3.1)

## 2023-06-14 LAB — CBC WITH DIFFERENTIAL/PLATELET
Abs Immature Granulocytes: 0.05 10*3/uL (ref 0.00–0.07)
Basophils Absolute: 0 10*3/uL (ref 0.0–0.1)
Basophils Relative: 0 %
Eosinophils Absolute: 0 10*3/uL (ref 0.0–0.5)
Eosinophils Relative: 0 %
HCT: 35.8 % — ABNORMAL LOW (ref 39.0–52.0)
Hemoglobin: 11.6 g/dL — ABNORMAL LOW (ref 13.0–17.0)
Immature Granulocytes: 1 %
Lymphocytes Relative: 24 %
Lymphs Abs: 2.5 10*3/uL (ref 0.7–4.0)
MCH: 27.2 pg (ref 26.0–34.0)
MCHC: 32.4 g/dL (ref 30.0–36.0)
MCV: 83.8 fL (ref 80.0–100.0)
Monocytes Absolute: 0.8 10*3/uL (ref 0.1–1.0)
Monocytes Relative: 8 %
Neutro Abs: 6.9 10*3/uL (ref 1.7–7.7)
Neutrophils Relative %: 67 %
Platelets: 389 10*3/uL (ref 150–400)
RBC: 4.27 MIL/uL (ref 4.22–5.81)
RDW: 13.4 % (ref 11.5–15.5)
WBC: 10.3 10*3/uL (ref 4.0–10.5)
nRBC: 0 % (ref 0.0–0.2)

## 2023-06-14 LAB — VITAMIN B12: Vitamin B-12: 986 pg/mL — ABNORMAL HIGH (ref 180–914)

## 2023-06-14 LAB — FOLATE: Folate: 28.1 ng/mL (ref >5.9)

## 2023-06-14 LAB — IRON AND TIBC
Iron: 81 ug/dL (ref 45–182)
Saturation Ratios: 32 % (ref 17.9–39.5)
TIBC: 255 ug/dL (ref 250–450)
UIBC: 174 ug/dL

## 2023-06-14 LAB — PHOSPHORUS: Phosphorus: 2.7 mg/dL (ref 2.5–4.6)

## 2023-06-14 LAB — URINE CULTURE: Culture: NO GROWTH

## 2023-06-14 LAB — FERRITIN: Ferritin: 69 ng/mL (ref 24–336)

## 2023-06-14 LAB — GLUCOSE, CAPILLARY
Glucose-Capillary: 115 mg/dL — ABNORMAL HIGH (ref 70–99)
Glucose-Capillary: 125 mg/dL — ABNORMAL HIGH (ref 70–99)
Glucose-Capillary: 126 mg/dL — ABNORMAL HIGH (ref 70–99)
Glucose-Capillary: 134 mg/dL — ABNORMAL HIGH (ref 70–99)
Glucose-Capillary: 149 mg/dL — ABNORMAL HIGH (ref 70–99)
Glucose-Capillary: 164 mg/dL — ABNORMAL HIGH (ref 70–99)

## 2023-06-14 LAB — LACTIC ACID, PLASMA: Lactic Acid, Venous: 1.4 mmol/L (ref 0.5–1.9)

## 2023-06-14 LAB — MAGNESIUM: Magnesium: 2.2 mg/dL (ref 1.7–2.4)

## 2023-06-14 MED ORDER — LACTATED RINGERS IV SOLN: INTRAVENOUS | Status: AC

## 2023-06-14 NOTE — Care Management Obs Status (Signed)
 MEDICARE OBSERVATION STATUS NOTIFICATION   Patient Details  Name: William Acevedo MRN: [id number] Date of Birth: Aug 10, 1955   Medicare Observation Status Notification Given:  Yes Moon/Obs letter explained to patient emergency contact person William Acevedo at [phone] and documentation will be mailed to him at home address.    Kamron Portee 06/14/2023, 1:09 PM

## 2023-06-14 NOTE — Progress Notes (Signed)
 PROGRESS NOTE    William Acevedo  ZOX:[id number] DOB: 03-10-1955 DOA: 06/12/2023 PCP: Maryellen Snare, NP   Brief Narrative:  The patient is a 68 year old AAM past medical history significant for hypertension, hyperlipidemia, diabetes mellitus type 2, history of CVA, history of PTSD, with depression anxiety as well as cocaine use who presents with intractable nausea vomiting diarrhea.  Patient presented with 1 day history of nausea vomiting and diarrhea and per his brother states he is not able to hold anything down and had some worsening abdominal pain.  Currently there is an illness going around we will facility.  He was admitted for dehydration and suspected caudae but UTI has been ruled out as urine culture showing no growth.  Intractable nausea vomiting is improving and he still having some diarrheal episodes.  Will send off GI pathogen panel.  PT OT recommending SNF once medically stable but will resume IV fluid hydration today.  Respiratory virus panel is negative and will need to follow-up on GI pathogen panel.  Anticipating discharge in next 24 to 48 hours if she is further improved.  Assessment and Plan:  Intractable Nausea/Vomiting/Diarrhea Elevated Lactic Acid, improved Possible CAUTI, ruled out -Pt with n/v/d and elevated lactic acid likely reflecting dehydration. ? Viral Gastroenteritis vs Food Poisioning. Improving.  -On IV CTX 1g daily for now given concern for UTI but Urine Cx showing No Growth so will D/C Abx after tomorrow's Dose -MIVF: LR at 100cc/h for 24h now has discontinued but will resume at 75 mL/h for a day -IV zofran  prn -Had Urinary Retention so that is why Foley catheter is in place. -F/u RVP Negative; GI Pathogen Panel pending. F/u lactic acid -PT OT recommending SNF   Essential HTN: C/w  Amlodipine  5 mg daily. CTM BP per Protocol. Last BP reading was 143/76   HLD: C/w Atorvastatin  80mg  daily and Ezetimibe  10 mg p.o. daily   DM Type 2: C/w Semglee  10U at  bedtime + Moderate Novolog  SSI TID AC; CTM    H/o CVA: C/w ASA 81 mg po daily, Clopidogrel  75 mg po Dauly, and Atorvastatin  80 mg po Daily   PTSD/Depression/Anxiety: C/w Duloxetine  60 mg po Daily, Olanzapine  5 mg po qHS, and Trazodone  50 mg po qHS  Elevated Anion Gap Metabolic Acidosis: Mild and improved. Patient's CO2 is now 25, AG is 7, Chloride Level is 103. CTM and Trend and Repeat CMP in the AM  Normocytic Anemia: Hgb/Hct is now 11.6/35.8. Check Anemia Panel and showed an iron level of 81, UIBC 174, TIBC of 255, saturation ration of 32%, ferritin level of 69, folate of 28.1 and vitamin B12 986. CTM for S/Sx of Bleeding; No overt bleeding noted. Repeat CBC in the AM   Thrombocytosis: Mild and improved.  Likely Reactive in the setting of Above. Plt Count is now gone from 435 and is now 389. CTM and Trend and Repeat CBC in the AM   Hypoalbuminemia: Patient's Albumin Level went from 3.6 -> 3.2. CTM and Trend and Repeat CMP in the AM   DVT prophylaxis: enoxaparin  (LOVENOX ) injection 40 mg Start: 06/13/23 1000    Code Status: Full Code Family Communication: D/w Brother @ Bedside  Disposition Plan:  Level of care: Med-Surg Status is: Observation The patient remains OBS appropriate and will d/c before 2 midnights.   Consultants:  None  Procedures:  As delineated as above  Antimicrobials:  Anti-infectives (From admission, onward)    Start     Dose/Rate Route Frequency Ordered Stop  06/14/23 0600  cefTRIAXone  (ROCEPHIN ) 1 g in sodium chloride  0.9 % 100 mL IVPB        1 g 200 mL/hr over 30 Minutes Intravenous Every 24 hours 06/13/23 1029 06/16/23 0559   06/13/23 0200  cefTRIAXone  (ROCEPHIN ) 1 g in sodium chloride  0.9 % 100 mL IVPB        1 g 200 mL/hr over 30 Minutes Intravenous  Once 06/13/23 0145 06/13/23 0250       Subjective: Seen and examined at bedside in he remains a little confused but is oriented to himself, month and year.  Brother at bedside and states that he  started getting sick on Sunday.  Thinks that the food at the facility they do not cook very well.  No nausea or vomiting now but has some looser stools.  No other concerns or complaints at this time.  Objective: Vitals:   06/13/23 1959 06/14/23 0401 06/14/23 0804 06/14/23 1708  BP: (!) 162/79 (!) 154/75 (!) 141/70 (!) 143/76  Pulse: 77 72 75 69  Resp: 18 16    Temp: 98 F (36.7 C) 98.1 F (36.7 C) 98 F (36.7 C) 98 F (36.7 C)  TempSrc: Oral Oral    SpO2: 98% 100% 100% 99%  Weight:      Height:        Intake/Output Summary (Last 24 hours) at 06/14/2023 1742 Last data filed at 06/14/2023 1504 Gross per 24 hour  Intake 100 ml  Output --  Net 100 ml   Filed Weights   06/12/23 2219  Weight: 61 kg   Examination: Physical Exam:  Constitutional: Thin African-American male in no acute distress Respiratory: Diminished to auscultation bilaterally, no wheezing, rales, rhonchi or crackles. Normal respiratory effort and patient is not tachypenic. No accessory muscle use.  Unlabored breathing Cardiovascular: RRR, no murmurs / rubs / gallops. S1 and S2 auscultated. No extremity edema.  Abdomen: Soft, non-tender, non-distended. Bowel sounds positive.  GU: Deferred.  Catheter is in place and is draining clear yellow urine Musculoskeletal: No clubbing / cyanosis of digits/nails. No joint deformity upper and lower extremities. Skin: No rashes, lesions, ulcers. No induration; Warm and dry.  Neurologic: CN 2-12 grossly intact with no focal deficits. Romberg sign cerebellar reflexes not assessed.  Psychiatric: Alert and oriented x 2 but is a little confused.  Data Reviewed: I have personally reviewed following labs and imaging studies  CBC: Recent Labs  Lab 06/12/23 2237 06/13/23 0926 06/14/23 0858  WBC 9.6 9.2 10.3  NEUTROABS 8.6*  --  6.9  HGB 14.2 11.9* 11.6*  HCT 44.9 37.6* 35.8*  MCV 84.7 84.1 83.8  PLT 462* 435* 389   Basic Metabolic Panel: Recent Labs  Lab 06/12/23 2351  06/13/23 0926 06/14/23 0858  NA 144  --  135  K 4.3  --  3.7  CL 107  --  103  CO2 21*  --  25  GLUCOSE 271*  --  183*  BUN 20  --  17  CREATININE 1.18 1.26* 0.95  CALCIUM  9.1  --  8.3*  MG  --   --  2.2  PHOS  --   --  2.7   GFR: Estimated Creatinine Clearance: 63.3 mL/min (by C-G formula based on SCr of 0.95 mg/dL). Liver Function Tests: Recent Labs  Lab 06/12/23 2351 06/14/23 0858  AST 19 18  ALT 16 14  ALKPHOS 60 51  BILITOT 0.8 0.8  PROT 8.0 7.0  ALBUMIN 3.6 3.2*   Recent Labs  Lab 06/12/23 2351  LIPASE 20   No results for input(s): "AMMONIA " in the last 168 hours. Coagulation Profile: No results for input(s): "INR", "PROTIME" in the last 168 hours. Cardiac Enzymes: No results for input(s): "CKTOTAL", "CKMB", "CKMBINDEX", "TROPONINI" in the last 168 hours. BNP (last 3 results) No results for input(s): "PROBNP" in the last 8760 hours. HbA1C: No results for input(s): "HGBA1C" in the last 72 hours. CBG: Recent Labs  Lab 06/14/23 0005 06/14/23 0402 06/14/23 0803 06/14/23 1152 06/14/23 1708  GLUCAP 149* 125* 164* 126* 134*   Lipid Profile: No results for input(s): "CHOL", "HDL", "LDLCALC", "TRIG", "CHOLHDL", "LDLDIRECT" in the last 72 hours. Thyroid  Function Tests: No results for input(s): "TSH", "T4TOTAL", "FREET4", "T3FREE", "THYROIDAB" in the last 72 hours. Anemia Panel: Recent Labs    06/14/23 0858  VITAMINB12 986*  FOLATE 28.1  FERRITIN 69  TIBC 255  IRON 81  RETICCTPCT 1.1   Sepsis Labs: Recent Labs  Lab 06/12/23 2258 06/13/23 0141 06/14/23 0406  LATICACIDVEN 2.2* 2.1* 1.4    Recent Results (from the past 240 hours)  Urine Culture     Status: Abnormal   Collection Time: 06/05/23  8:05 PM   Specimen: Urine, Catheterized  Result Value Ref Range Status   Specimen Description   Final    URINE, CATHETERIZED Performed at [facility], [address] [place], [place] [address]    Special Requests   Final     NONE Performed at [facility], [address], [place], [place] [address]    Culture >=100,000 COLONIES/mL ESCHERICHIA COLI (A)  Final   Report Status 06/08/2023 FINAL  Final   Organism ID, Bacteria ESCHERICHIA COLI (A)  Final      Susceptibility   Escherichia coli - MIC*    AMPICILLIN 4 SENSITIVE Sensitive     CEFAZOLIN <=4 SENSITIVE Sensitive     CEFEPIME <=0.12 SENSITIVE Sensitive     CEFTRIAXONE  <=0.25 SENSITIVE Sensitive     CIPROFLOXACIN  <=0.25 SENSITIVE Sensitive     GENTAMICIN <=1 SENSITIVE Sensitive     IMIPENEM <=0.25 SENSITIVE Sensitive     NITROFURANTOIN <=16 SENSITIVE Sensitive     TRIMETH/SULFA >=320 RESISTANT Resistant     AMPICILLIN/SULBACTAM <=2 SENSITIVE Sensitive     PIP/TAZO <=4 SENSITIVE Sensitive ug/mL    * >=100,000 COLONIES/mL ESCHERICHIA COLI  Resp panel by RT-PCR (RSV, Flu A&B, Covid) Anterior Nasal Swab     Status: None   Collection Time: 06/12/23 10:51 PM   Specimen: Anterior Nasal Swab  Result Value Ref Range Status   SARS Coronavirus 2 by RT PCR NEGATIVE NEGATIVE Final   Influenza A by PCR NEGATIVE NEGATIVE Final   Influenza B by PCR NEGATIVE NEGATIVE Final    Comment: (NOTE) The Xpert Xpress SARS-CoV-2/FLU/RSV plus assay is intended as an aid in the diagnosis of influenza from Nasopharyngeal swab specimens and should not be used as a sole basis for treatment. Nasal washings and aspirates are unacceptable for Xpert Xpress SARS-CoV-2/FLU/RSV testing.  Fact Sheet for Patients: BloggerCourse.com  Fact Sheet for Healthcare Providers: SeriousBroker.it  This test is not yet approved or cleared by the [place]  FDA and has been authorized for detection and/or diagnosis of SARS-CoV-2 by FDA under an Emergency Use Authorization (EUA). This EUA will remain in effect (meaning this test can be used) for the duration of the COVID-19 declaration under Section 564(b)(1) of the Act, 21  U.S.C. section 360bbb-3(b)(1), unless the authorization is terminated or revoked.  Resp Syncytial Virus by PCR NEGATIVE NEGATIVE Final    Comment: (NOTE) Fact Sheet for Patients: BloggerCourse.com  Fact Sheet for Healthcare Providers: SeriousBroker.it  This test is not yet approved or cleared by the [place]  FDA and has been authorized for detection and/or diagnosis of SARS-CoV-2 by FDA under an Emergency Use Authorization (EUA). This EUA will remain in effect (meaning this test can be used) for the duration of the COVID-19 declaration under Section 564(b)(1) of the Act, 21 U.S.C. section 360bbb-3(b)(1), unless the authorization is terminated or revoked.  Performed at [facility], [address] [place], [place] [address]   Respiratory (~20 pathogens) panel by PCR     Status: None   Collection Time: 06/13/23  8:16 AM   Specimen: Nasopharyngeal Swab; Respiratory  Result Value Ref Range Status   Adenovirus NOT DETECTED NOT DETECTED Final   Coronavirus 229E NOT DETECTED NOT DETECTED Final    Comment: (NOTE) The Coronavirus on the Respiratory Panel, DOES NOT test for the novel  Coronavirus (2019 nCoV)    Coronavirus HKU1 NOT DETECTED NOT DETECTED Final   Coronavirus NL63 NOT DETECTED NOT DETECTED Final   Coronavirus OC43 NOT DETECTED NOT DETECTED Final   Metapneumovirus NOT DETECTED NOT DETECTED Final   Rhinovirus / Enterovirus NOT DETECTED NOT DETECTED Final   Influenza A NOT DETECTED NOT DETECTED Final   Influenza B NOT DETECTED NOT DETECTED Final   Parainfluenza Virus 1 NOT DETECTED NOT DETECTED Final   Parainfluenza Virus 2 NOT DETECTED NOT DETECTED Final   Parainfluenza Virus 3 NOT DETECTED NOT DETECTED Final   Parainfluenza Virus 4 NOT DETECTED NOT DETECTED Final   Respiratory Syncytial Virus NOT DETECTED NOT DETECTED Final   Bordetella pertussis NOT DETECTED NOT DETECTED Final   Bordetella Parapertussis  NOT DETECTED NOT DETECTED Final   Chlamydophila pneumoniae NOT DETECTED NOT DETECTED Final   Mycoplasma pneumoniae NOT DETECTED NOT DETECTED Final    Comment: Performed at [facility], [address], [place], [place] [address]  Remove and replace urinary cath (placed > 5 days) then obtain urine culture from new indwelling urinary catheter.     Status: None   Collection Time: 06/13/23 10:28 AM   Specimen: Urine, Catheterized  Result Value Ref Range Status   Specimen Description URINE, CATHETERIZED  Final   Special Requests NONE  Final   Culture   Final    NO GROWTH Performed at [facility], [address], [place], [place] [address]    Report Status 06/14/2023 FINAL  Final    Radiology Studies: CT ABDOMEN PELVIS W CONTRAST Result Date: 06/13/2023 CLINICAL DATA:  Left lower quadrant abdominal pain, nausea, vomiting, diarrhea, fever EXAM: CT ABDOMEN AND PELVIS WITH CONTRAST TECHNIQUE: Multidetector CT imaging of the abdomen and pelvis was performed using the standard protocol following bolus administration of intravenous contrast. RADIATION DOSE REDUCTION: This exam was performed according to the departmental dose-optimization program which includes automated exposure control, adjustment of the mA and/or kV according to patient size and/or use of iterative reconstruction technique. CONTRAST:  75mL OMNIPAQUE  IOHEXOL  350 MG/ML SOLN COMPARISON:  CT 07/16/2022 FINDINGS: Lower chest: No acute abnormality. Hepatobiliary: Unremarkable liver. Normal gallbladder. No biliary dilation. Pancreas: Unremarkable. Spleen: Unremarkable. Adrenals/Urinary Tract: Normal adrenal glands. Bilateral cortical renal scarring, greater on the right. Nonobstructing right nephrolithiasis. Large amount of gas in the bladder. Foley catheter. Diffuse bladder wall thickening. Stomach/Bowel: Normal caliber large and small bowel. Colonic diverticulosis without diverticulitis. Large colonic stool burden. No  bowel wall  thickening. The appendix is normal.Stomach is within normal limits. Vascular/Lymphatic: Advanced aortic atherosclerotic calcification. No lymphadenopathy. Reproductive: Enlarged prostate. Other: No free intraperitoneal fluid or air. Musculoskeletal: No acute fracture. IMPRESSION: 1. Diffuse bladder wall thickening may be due to underdistention, cystitis, or obstructive uropathy. Large amount of gas and a Foley catheter in the bladder. 2. Colonic diverticulosis without diverticulitis. 3. Nonobstructing right nephrolithiasis. 4. Aortic Atherosclerosis (ICD10-I70.0). Electronically Signed   By: Rozell Cornet M.D.   On: 06/13/2023 01:29   Scheduled Meds:  amLODipine   5 mg Oral Daily   aspirin  EC  81 mg Oral Daily   atorvastatin   80 mg Oral Daily   Chlorhexidine  Gluconate Cloth  6 each Topical Daily   clopidogrel   75 mg Oral Daily   DULoxetine   60 mg Oral Daily   enoxaparin  (LOVENOX ) injection  40 mg Subcutaneous Q24H   ezetimibe   10 mg Oral Daily   famotidine   20 mg Oral QAC breakfast   folic acid   1 mg Oral Daily   insulin  aspart  0-15 Units Subcutaneous TID WC   insulin  glargine-yfgn  10 Units Subcutaneous QHS   metoprolol  succinate  50 mg Oral Daily   OLANZapine   5 mg Oral QHS   tamsulosin   0.4 mg Oral QHS   thiamine   100 mg Oral Daily   traZODone   50 mg Oral QHS   Continuous Infusions:  cefTRIAXone  (ROCEPHIN )  IV Stopped (06/14/23 0631)   lactated ringers       LOS: 0 days   Aura Leeds, DO [facility] Available via Epic secure chat 7am-7pm After these hours, please refer to coverage provider listed on [email or web address] 06/14/2023, 5:42 PM

## 2023-06-14 NOTE — Evaluation (Signed)
 Physical Therapy Evaluation Patient Details Name: William Acevedo MRN: [id number] DOB: 1956/01/03 Today's Date: 06/14/2023  History of Present Illness  Pt is a 68 y/o M admitted on 06/12/23 after presenting with intractable N&V.PMH: HTN, HLD, DM2, CVA, PTSD, cocaine use, depression, EtOH abuse, neuropathy  Clinical Impression  Pt seen for PT evaluation with pt agreeable. Pt requires extra time to don LLE sock, PT assists with RLE. Pt with delayed responses to questions, impaired safety awareness, impaired communication. Pt does report prior to last admission he was independent without AD but this conflicts previous chart entry. Pt requires mod assist for transfers, mod assist for gait with HHA. Pt would benefit from ongoing PT treatment to address balance, strengthening, & safety with mobility. Recommend post acute rehab <3 hours therapy/day upon d/c.        If plan is discharge home, recommend the following: A lot of help with walking and/or transfers;A lot of help with bathing/dressing/bathroom;Assist for transportation;Assistance with cooking/housework;Help with stairs or ramp for entrance   Can travel by private vehicle   No    Equipment Recommendations Other (comment) (defer to next venue)  Recommendations for Other Services  Rehab consult    Functional Status Assessment Patient has had a recent decline in their functional status and demonstrates the ability to make significant improvements in function in a reasonable and predictable amount of time.     Precautions / Restrictions Precautions Precautions: Fall Restrictions Weight Bearing Restrictions Per Provider Order: No      Mobility  Bed Mobility Overal bed mobility: Needs Assistance Bed Mobility: Supine to Sit, Sit to Supine     Supine to sit: Supervision, HOB elevated, Used rails Sit to supine: Supervision, HOB elevated, Used rails        Transfers Overall transfer level: Needs assistance Equipment used: 1  person hand held assist Transfers: Sit to/from Stand Sit to Stand: Mod assist, Min assist           General transfer comment: sit<>stand from EOB, toilet with grab bar    Ambulation/Gait Ambulation/Gait assistance: Mod assist Gait Distance (Feet): 15 Feet (+ 15 ft) Assistive device: 1 person hand held assist Gait Pattern/deviations: Decreased step length - right, Decreased step length - left, Decreased stride length, Decreased dorsiflexion - right, Decreased dorsiflexion - left Gait velocity: decreased     General Gait Details: wide step width LLE, decreased step length RLE, decreased weight shift to RLE, impaired balance  Stairs            Wheelchair Mobility     Tilt Bed    Modified Rankin (Stroke Patients Only)       Balance Overall balance assessment: Needs assistance Sitting-balance support: Feet supported Sitting balance-Leahy Scale: Fair Sitting balance - Comments: close supervision static sitting   Standing balance support: Single extremity supported, During functional activity, Reliant on assistive device for balance Standing balance-Leahy Scale: Poor                               Pertinent Vitals/Pain Pain Assessment Pain Assessment: Faces Faces Pain Scale: No hurt    Home Living Family/patient expects to be discharged to:: Skilled nursing facility                        Prior Function Prior Level of Function : Patient poor historian/Family not available  Mobility Comments: Pt reports prior to last admission he was independent without AD but this conflicts with chart information; no family/friends present to clarify.       Extremity/Trunk Assessment   Upper Extremity Assessment Upper Extremity Assessment: Generalized weakness;Difficult to assess due to impaired cognition    Lower Extremity Assessment Lower Extremity Assessment: Generalized weakness;Difficult to assess due to impaired cognition        Communication   Communication Communication: Impaired Factors Affecting Communication: Difficulty expressing self;Reduced clarity of speech    Cognition Arousal: Alert Behavior During Therapy: Flat affect   PT - Cognitive impairments: No family/caregiver present to determine baseline                       PT - Cognition Comments: Pt follows simple commands inconsistently with extra time during session. Not oriented to age (reports he's 68 y/o), does not respond when asked where we are. Restless, holding catheter tubing with extra time & cuing to let go of it, reports need to use restroom but once sitting on toilet he fidgets with his gown/stomach & does not use bathroom. Following commands: Impaired Following commands impaired: Follows one step commands with increased time, Follows one step commands inconsistently     Cueing Cueing Techniques: Verbal cues     General Comments      Exercises     Assessment/Plan    PT Assessment Patient needs continued PT services  PT Problem List Decreased strength;Decreased range of motion;Decreased coordination;Decreased activity tolerance;Decreased balance;Decreased mobility;Decreased safety awareness;Decreased knowledge of use of DME;Decreased cognition       PT Treatment Interventions DME instruction;Balance training;Gait training;Neuromuscular re-education;Stair training;Functional mobility training;Therapeutic activities;Therapeutic exercise;Patient/family education;Cognitive remediation;Modalities    PT Goals (Current goals can be found in the Care Plan section)  Acute Rehab PT Goals Patient Stated Goal: does not state PT Goal Formulation: Patient unable to participate in goal setting Time For Goal Achievement: 06/28/23 Potential to Achieve Goals: Fair    Frequency Min 2X/week     Co-evaluation               AM-PAC PT "6 Clicks" Mobility  Outcome Measure Help needed turning from your back to your side while in  a flat bed without using bedrails?: A Little Help needed moving from lying on your back to sitting on the side of a flat bed without using bedrails?: A Little Help needed moving to and from a bed to a chair (including a wheelchair)?: A Lot Help needed standing up from a chair using your arms (e.g., wheelchair or bedside chair)?: A Lot Help needed to walk in hospital room?: A Lot Help needed climbing 3-5 steps with a railing? : Total 6 Click Score: 13    End of Session   Activity Tolerance: Patient tolerated treatment well Patient left: in bed;with call bell/phone within reach;with bed alarm set Nurse Communication: Mobility status PT Visit Diagnosis: Unsteadiness on feet (R26.81);Muscle weakness (generalized) (M62.81);Other abnormalities of gait and mobility (R26.89);Difficulty in walking, not elsewhere classified (R26.2)    Time: 1610-9604 PT Time Calculation (min) (ACUTE ONLY): 18 min   Charges:   PT Evaluation $PT Eval Low Complexity: 1 Low   PT General Charges $$ ACUTE PT VISIT: 1 Visit         Emaline Handsome, PT, DPT 06/14/23, 3:27 PM   Venetta Gill 06/14/2023, 3:25 PM

## 2023-06-14 NOTE — Progress Notes (Addendum)
 IVT consult placed to obtain PIV access. This RN to bedside.  1 attempt made to R FA, while attempting to cannulate pt swung at this RN with left arm in an apparent attempt to strike. Attempt abandoned for staff safety. Pt's hand remained raised in a threatening posture, preventing this RN from safely apply gauze to the site.  Primary RN notified at nurse's station.

## 2023-06-14 NOTE — TOC Progression Note (Signed)
 Transition of Care [facility]) - Progression Note    Patient Details  Name: William Acevedo MRN: [id number] Date of Birth: May 05, 1955  Transition of [facility]) CM/SW Contact  Juliane Och, LCSW Phone Number: 06/14/2023, 4:25 PM  Clinical Narrative:     4:25 PM CSW aware of physical therapy recommendation of patient discharging to SNF. CSW to submit SNF referral to [facility] upon OT evaluation.  Expected Discharge Plan: Skilled Nursing Facility Barriers to Discharge: Continued Medical Work up  Expected Discharge Plan and Services In-house Referral: Clinical Social Work   Post Acute Care Choice: Skilled Nursing Facility Living arrangements for the past 2 months: Skilled Nursing Facility                                       Social Determinants of Health (SDOH) Interventions SDOH Screenings   Food Insecurity: Patient Unable To Answer (06/13/2023)  Housing: Patient Unable To Answer (06/13/2023)  Transportation Needs: Patient Unable To Answer (06/13/2023)  Utilities: Patient Unable To Answer (06/13/2023)  Social Connections: Patient Unable To Answer (06/13/2023)  Tobacco Use: High Risk (06/12/2023)    Readmission Risk Interventions     No data to display

## 2023-06-14 NOTE — Hospital Course (Addendum)
 The patient is a 68 year old AAM past medical history significant for hypertension, hyperlipidemia, diabetes mellitus type 2, history of CVA, history of PTSD, with depression anxiety as well as cocaine use who presents with intractable nausea vomiting diarrhea.  Patient presented with 1 day history of nausea vomiting and diarrhea and per his brother states he is not able to hold anything down and had some worsening abdominal pain.  Currently there is an illness going around we will facility.  He was admitted for dehydration and suspected caudae but UTI has been ruled out as urine culture showing no growth.  Intractable nausea vomiting is improving and he still having some diarrheal episodes.  Will send off GI pathogen panel.  PT OT recommending SNF once medically stable but will resume IV fluid hydration today.  Respiratory virus panel is negative and will need to follow-up on GI pathogen panel.  Anticipating discharge in next 24 to 48 hours if she is further improved.  Assessment and Plan:  Intractable Nausea/Vomiting/Diarrhea Elevated Lactic Acid, improved Possible CAUTI, ruled out -Pt with n/v/d and elevated lactic acid likely reflecting dehydration. ? Viral Gastroenteritis vs Food Poisioning. Improving.  -On IV CTX 1g daily for now given concern for UTI but Urine Cx showing No Growth so will D/C Abx after tomorrow's Dose -MIVF: LR at 100cc/h for 24h now has discontinued but will resume at 75 mL/h for a day -IV zofran  prn -Had Urinary Retention so that is why Foley catheter is in place. -F/u RVP Negative; GI Pathogen Panel pending. F/u lactic acid -PT OT recommending SNF   Essential HTN: C/w  Amlodipine  5 mg daily. CTM BP per Protocol. Last BP reading was 143/76   HLD: C/w Atorvastatin  80mg  daily and Ezetimibe  10 mg p.o. daily   DM Type 2: C/w Semglee  10U at bedtime + Moderate Novolog  SSI TID AC; CTM    H/o CVA: C/w ASA 81 mg po daily, Clopidogrel  75 mg po Dauly, and Atorvastatin  80 mg po  Daily   PTSD/Depression/Anxiety: C/w Duloxetine  60 mg po Daily, Olanzapine  5 mg po qHS, and Trazodone  50 mg po qHS  Elevated Anion Gap Metabolic Acidosis: Mild and improved. Patient's CO2 is now 25, AG is 7, Chloride Level is 103. CTM and Trend and Repeat CMP in the AM  Normocytic Anemia: Hgb/Hct is now 11.6/35.8. Check Anemia Panel and showed an iron level of 81, UIBC 174, TIBC of 255, saturation ration of 32%, ferritin level of 69, folate of 28.1 and vitamin B12 986. CTM for S/Sx of Bleeding; No overt bleeding noted. Repeat CBC in the AM   Thrombocytosis: Mild and improved.  Likely Reactive in the setting of Above. Plt Count is now gone from 435 and is now 389. CTM and Trend and Repeat CBC in the AM   Hypoalbuminemia: Patient's Albumin Level went from 3.6 -> 3.2. CTM and Trend and Repeat CMP in the AM

## 2023-06-15 DIAGNOSIS — Z7984 Long term (current) use of oral hypoglycemic drugs: Secondary | ICD-10-CM | POA: Diagnosis not present

## 2023-06-15 DIAGNOSIS — E871 Hypo-osmolality and hyponatremia: Secondary | ICD-10-CM

## 2023-06-15 DIAGNOSIS — R3989 Other symptoms and signs involving the genitourinary system: Secondary | ICD-10-CM | POA: Diagnosis not present

## 2023-06-15 DIAGNOSIS — A059 Bacterial foodborne intoxication, unspecified: Secondary | ICD-10-CM | POA: Diagnosis present

## 2023-06-15 DIAGNOSIS — Z8673 Personal history of transient ischemic attack (TIA), and cerebral infarction without residual deficits: Secondary | ICD-10-CM | POA: Diagnosis not present

## 2023-06-15 DIAGNOSIS — R197 Diarrhea, unspecified: Secondary | ICD-10-CM | POA: Diagnosis not present

## 2023-06-15 DIAGNOSIS — F17210 Nicotine dependence, cigarettes, uncomplicated: Secondary | ICD-10-CM | POA: Diagnosis present

## 2023-06-15 DIAGNOSIS — E872 Acidosis, unspecified: Secondary | ICD-10-CM | POA: Diagnosis present

## 2023-06-15 DIAGNOSIS — R112 Nausea with vomiting, unspecified: Secondary | ICD-10-CM | POA: Diagnosis present

## 2023-06-15 DIAGNOSIS — F32A Depression, unspecified: Secondary | ICD-10-CM | POA: Diagnosis present

## 2023-06-15 DIAGNOSIS — K5730 Diverticulosis of large intestine without perforation or abscess without bleeding: Secondary | ICD-10-CM | POA: Diagnosis present

## 2023-06-15 DIAGNOSIS — G5600 Carpal tunnel syndrome, unspecified upper limb: Secondary | ICD-10-CM | POA: Diagnosis present

## 2023-06-15 DIAGNOSIS — Z794 Long term (current) use of insulin: Secondary | ICD-10-CM | POA: Diagnosis not present

## 2023-06-15 DIAGNOSIS — F4310 Post-traumatic stress disorder, unspecified: Secondary | ICD-10-CM | POA: Diagnosis present

## 2023-06-15 DIAGNOSIS — Z1152 Encounter for screening for COVID-19: Secondary | ICD-10-CM | POA: Diagnosis not present

## 2023-06-15 DIAGNOSIS — D649 Anemia, unspecified: Secondary | ICD-10-CM | POA: Diagnosis present

## 2023-06-15 DIAGNOSIS — I10 Essential (primary) hypertension: Secondary | ICD-10-CM | POA: Diagnosis present

## 2023-06-15 DIAGNOSIS — F419 Anxiety disorder, unspecified: Secondary | ICD-10-CM | POA: Diagnosis present

## 2023-06-15 DIAGNOSIS — R944 Abnormal results of kidney function studies: Secondary | ICD-10-CM | POA: Diagnosis present

## 2023-06-15 DIAGNOSIS — E860 Dehydration: Secondary | ICD-10-CM | POA: Diagnosis present

## 2023-06-15 DIAGNOSIS — Z79899 Other long term (current) drug therapy: Secondary | ICD-10-CM | POA: Diagnosis not present

## 2023-06-15 DIAGNOSIS — A084 Viral intestinal infection, unspecified: Secondary | ICD-10-CM | POA: Diagnosis present

## 2023-06-15 DIAGNOSIS — E8809 Other disorders of plasma-protein metabolism, not elsewhere classified: Secondary | ICD-10-CM | POA: Diagnosis present

## 2023-06-15 DIAGNOSIS — E1142 Type 2 diabetes mellitus with diabetic polyneuropathy: Secondary | ICD-10-CM | POA: Diagnosis present

## 2023-06-15 DIAGNOSIS — D75839 Thrombocytosis, unspecified: Secondary | ICD-10-CM | POA: Diagnosis present

## 2023-06-15 DIAGNOSIS — G8929 Other chronic pain: Secondary | ICD-10-CM | POA: Diagnosis present

## 2023-06-15 DIAGNOSIS — E785 Hyperlipidemia, unspecified: Secondary | ICD-10-CM | POA: Diagnosis present

## 2023-06-15 LAB — MAGNESIUM: Magnesium: 2.2 mg/dL (ref 1.7–2.4)

## 2023-06-15 LAB — CBC WITH DIFFERENTIAL/PLATELET
Abs Immature Granulocytes: 0.02 10*3/uL (ref 0.00–0.07)
Basophils Absolute: 0 10*3/uL (ref 0.0–0.1)
Basophils Relative: 0 %
Eosinophils Absolute: 0.1 10*3/uL (ref 0.0–0.5)
Eosinophils Relative: 1 %
HCT: 35.1 % — ABNORMAL LOW (ref 39.0–52.0)
Hemoglobin: 11.2 g/dL — ABNORMAL LOW (ref 13.0–17.0)
Immature Granulocytes: 0 %
Lymphocytes Relative: 26 %
Lymphs Abs: 1.6 10*3/uL (ref 0.7–4.0)
MCH: 26.9 pg (ref 26.0–34.0)
MCHC: 31.9 g/dL (ref 30.0–36.0)
MCV: 84.4 fL (ref 80.0–100.0)
Monocytes Absolute: 0.6 10*3/uL (ref 0.1–1.0)
Monocytes Relative: 10 %
Neutro Abs: 3.9 10*3/uL (ref 1.7–7.7)
Neutrophils Relative %: 63 %
Platelets: 339 10*3/uL (ref 150–400)
RBC: 4.16 MIL/uL — ABNORMAL LOW (ref 4.22–5.81)
RDW: 13.3 % (ref 11.5–15.5)
WBC: 6.3 10*3/uL (ref 4.0–10.5)
nRBC: 0 % (ref 0.0–0.2)

## 2023-06-15 LAB — COMPREHENSIVE METABOLIC PANEL WITH GFR
ALT: 14 U/L (ref 0–44)
AST: 17 U/L (ref 15–41)
Albumin: 2.7 g/dL — ABNORMAL LOW (ref 3.5–5.0)
Alkaline Phosphatase: 42 U/L (ref 38–126)
Anion gap: 8 (ref 5–15)
BUN: 21 mg/dL (ref 8–23)
CO2: 22 mmol/L (ref 22–32)
Calcium: 8.1 mg/dL — ABNORMAL LOW (ref 8.9–10.3)
Chloride: 104 mmol/L (ref 98–111)
Creatinine, Ser: 0.96 mg/dL (ref 0.61–1.24)
GFR, Estimated: >60 mL/min (ref >60)
Glucose, Bld: 252 mg/dL — ABNORMAL HIGH (ref 70–99)
Potassium: 4 mmol/L (ref 3.5–5.1)
Sodium: 134 mmol/L — ABNORMAL LOW (ref 135–145)
Total Bilirubin: 0.2 mg/dL (ref 0.0–1.2)
Total Protein: 6 g/dL — ABNORMAL LOW (ref 6.5–8.1)

## 2023-06-15 LAB — PHOSPHORUS: Phosphorus: 3.5 mg/dL (ref 2.5–4.6)

## 2023-06-15 LAB — GLUCOSE, CAPILLARY
Glucose-Capillary: 156 mg/dL — ABNORMAL HIGH (ref 70–99)
Glucose-Capillary: 160 mg/dL — ABNORMAL HIGH (ref 70–99)
Glucose-Capillary: 166 mg/dL — ABNORMAL HIGH (ref 70–99)
Glucose-Capillary: 201 mg/dL — ABNORMAL HIGH (ref 70–99)
Glucose-Capillary: 229 mg/dL — ABNORMAL HIGH (ref 70–99)
Glucose-Capillary: 257 mg/dL — ABNORMAL HIGH (ref 70–99)

## 2023-06-15 NOTE — Plan of Care (Signed)

## 2023-06-15 NOTE — Progress Notes (Signed)
 PROGRESS NOTE    William Acevedo  WUJ:[id number] DOB: 1956/01/06 DOA: 06/12/2023 PCP: Maryellen Snare, NP   Brief Narrative:  The patient is a 68 year old AAM past medical history significant for hypertension, hyperlipidemia, diabetes mellitus type 2, history of CVA, history of PTSD, with depression anxiety as well as cocaine use who presents with intractable nausea vomiting diarrhea.  Patient presented with 1 day history of nausea vomiting and diarrhea and per his brother states he is not able to hold anything down and had some worsening abdominal pain.  Currently there is an illness going around we will facility.  He was admitted for dehydration and suspected caudae but UTI has been ruled out as urine culture showing no growth.  Intractable nausea vomiting is improving and he still having some diarrheal episodes.  Will send off GI pathogen panel.  PT OT recommending SNF once medically stable but will resume IV fluid hydration today.  Respiratory virus panel is negative and will need to follow-up on GI pathogen panel which is pending (No further diarrheal episodes).  Anticipating discharge in next 24 to 48 hours if he is further improved and when [employer] is approved.   Assessment and Plan:  Intractable Nausea/Vomiting/Diarrhea, improving  Elevated Lactic Acid, improved Possible CAUTI, ruled out -Pt with N/V/D and elevated lactic acid likely reflecting dehydration. ? Viral Gastroenteritis vs Food Poisioning. Improving.  -Patient had a urinalysis done which showed clear appearance, straw-colored urine, greater than 500 glucose, moderate hemoglobin, 80 ketones, negative leukocytes, nitrates, 100 protein, no bacteria seen, with 0-5 RBCs per high-power field and 0-5 WBCs; Initially there was concern for UTI and now is s/p 3 doses of IV Ceftriaxone  but Urine Cx showing No Growth so we will discontinue antibiotics altogether -MIVF: LR at 100cc/h for 24h now has discontinued but will  resume at 75 mL/h for a day -C/w Supportive Care and C/w Antiemtics with Ondansetron  4 mg IV q8hprn Nausea/Vomiting  -LA trended done and resolved and went from 2.1 -> 1.4 -Had Urinary Retention so that is why Foley catheter is in place.  -RVP Negative; GI Pathogen Panel pending but nursing states he has had no further Diarrheal episodes or Bowel movements. -PT OT recommending SNF   Essential HTN: C/w  Amlodipine  5 mg daily. CTM BP per Protocol. Last BP reading was 147/81   HLD: C/w Atorvastatin  80mg  daily and Ezetimibe  10 mg p.o. daily   DM Type 2: C/w Semglee  10U at bedtime + Moderate Novolog  SSI TID AC; CTM CBGs per Protocol and they are ranging from 115-257 on the last 5 checks  Hyponatremia: Mild. Na+ went from 144 -> 135 -> 134. CTM and Trend and Repeat CMP in the AM   H/o CVA: C/w ASA 81 mg po daily, Clopidogrel  75 mg po Dauly, and Atorvastatin  80 mg po Daily   PTSD/Depression/Anxiety: C/w Duloxetine  60 mg po Daily, Olanzapine  5 mg po qHS, and Trazodone  50 mg po qHS  Elevated Anion Gap Metabolic Acidosis: Mild and improved. Patient's CO2 is now 22, AG is 8, Chloride Level is 104. CTM and Trend and Repeat CMP in the AM  Normocytic Anemia: Hgb/Hct is now 11.2/35.1. Check Anemia Panel and showed an iron level of 81, UIBC 174, TIBC of 255, saturation ration of 32%, ferritin level of 69, folate of 28.1 and vitamin B12 986. CTM for S/Sx of Bleeding; No overt bleeding noted. Repeat CBC in the AM   Thrombocytosis: Mild and improved.  Likely Reactive in the setting of Above. Plt  Count is now gone from 435 -> 389 _> 339. CTM and Trend and Repeat CBC in the AM   Hypoalbuminemia: Patient's Albumin Level went from 3.6 -> 3.2 -> 2.7. CTM and Trend and Repeat CMP in the AM   DVT prophylaxis: enoxaparin  (LOVENOX ) injection 40 mg Start: 06/13/23 1000    Code Status: Full Code Family Communication: No family present @ Bedside  Disposition Plan:  Level of care: Med-Surg Status is:  Observation The patient remains OBS appropriate and will d/c before 2 midnights.   Consultants:  None  Procedures:  As delineated as above  Antimicrobials:  Anti-infectives (From admission, onward)    Start     Dose/Rate Route Frequency Ordered Stop   06/14/23 0600  cefTRIAXone  (ROCEPHIN ) 1 g in sodium chloride  0.9 % 100 mL IVPB        1 g 200 mL/hr over 30 Minutes Intravenous Every 24 hours 06/13/23 1029 06/15/23 0622   06/13/23 0200  cefTRIAXone  (ROCEPHIN ) 1 g in sodium chloride  0.9 % 100 mL IVPB        1 g 200 mL/hr over 30 Minutes Intravenous  Once 06/13/23 0145 06/13/23 0250       Subjective: Seen and examined at bedside and he was a little somnolent and drowsy but easily arousable.  A little confused.  Denies any complaints and denies abdominal discomfort or pain.  There was questionable vomit versus him spitting out his food (oatmeal) after discussion with the OT today.  He denies any complaints and feels okay.  No other concerns or complaints at this time.  Objective: Vitals:   06/14/23 1708 06/14/23 1933 06/15/23 0405 06/15/23 0747  BP: (!) 143/76 (!) 141/74 (!) 141/70 (!) 147/81  Pulse: 69 72 65 72  Resp:  16 16 18   Temp: 98 F (36.7 C) 97.9 F (36.6 C) 97.7 F (36.5 C) 98 F (36.7 C)  TempSrc:  Oral Oral   SpO2: 99% 97% 98% 98%  Weight:      Height:        Intake/Output Summary (Last 24 hours) at 06/15/2023 1255 Last data filed at 06/15/2023 0300 Gross per 24 hour  Intake 311.79 ml  Output --  Net 311.79 ml   Filed Weights   06/12/23 2219  Weight: 61 kg   Examination: Physical Exam:  Constitutional: Thin elderly chronically ill-appearing male in no acute distress appears a little somnolent and drowsy Respiratory: Diminished to auscultation bilaterally, no wheezing, rales, rhonchi or crackles. Normal respiratory effort and patient is not tachypenic. No accessory muscle use.  Unlabored breathing Cardiovascular: RRR, no murmurs / rubs / gallops. S1 and  S2 auscultated. No extremity edema.  Abdomen: Soft, non-tender, non-distended. Bowel sounds positive.  GU: Deferred. Foley Catheter in place Musculoskeletal: No clubbing / cyanosis of digits/nails. No joint deformity upper and lower extremities. Skin: No rashes, lesions, ulcers. No induration; Warm and dry.  Neurologic: CN 2-12 grossly intact with no focal deficits but he is a little somnolent and drowsy. Romberg sign and cerebellar reflexes not assessed.  Psychiatric: Impaired judgement and insight. A little drowsy but arousable  Data Reviewed: I have personally reviewed following labs and imaging studies  CBC: Recent Labs  Lab 06/12/23 2237 06/13/23 0926 06/14/23 0858 06/15/23 0957  WBC 9.6 9.2 10.3 6.3  NEUTROABS 8.6*  --  6.9 3.9  HGB 14.2 11.9* 11.6* 11.2*  HCT 44.9 37.6* 35.8* 35.1*  MCV 84.7 84.1 83.8 84.4  PLT 462* 435* 389 339   Basic Metabolic Panel:  Recent Labs  Lab 06/12/23 2351 06/13/23 0926 06/14/23 0858 06/15/23 0957  NA 144  --  135 134*  K 4.3  --  3.7 4.0  CL 107  --  103 104  CO2 21*  --  25 22  GLUCOSE 271*  --  183* 252*  BUN 20  --  17 21  CREATININE 1.18 1.26* 0.95 0.96  CALCIUM  9.1  --  8.3* 8.1*  MG  --   --  2.2 2.2  PHOS  --   --  2.7 3.5   GFR: Estimated Creatinine Clearance: 62.7 mL/min (by C-G formula based on SCr of 0.96 mg/dL). Liver Function Tests: Recent Labs  Lab 06/12/23 2351 06/14/23 0858 06/15/23 0957  AST 19 18 17   ALT 16 14 14   ALKPHOS 60 51 42  BILITOT 0.8 0.8 0.2  PROT 8.0 7.0 6.0*  ALBUMIN 3.6 3.2* 2.7*   Recent Labs  Lab 06/12/23 2351  LIPASE 20   No results for input(s): "AMMONIA " in the last 168 hours. Coagulation Profile: No results for input(s): "INR", "PROTIME" in the last 168 hours. Cardiac Enzymes: No results for input(s): "CKTOTAL", "CKMB", "CKMBINDEX", "TROPONINI" in the last 168 hours. BNP (last 3 results) No results for input(s): "PROBNP" in the last 8760 hours. HbA1C: No results for input(s):  "HGBA1C" in the last 72 hours. CBG: Recent Labs  Lab 06/14/23 2049 06/15/23 0037 06/15/23 0407 06/15/23 0747 06/15/23 1212  GLUCAP 115* 257* 156* 160* 201*   Lipid Profile: No results for input(s): "CHOL", "HDL", "LDLCALC", "TRIG", "CHOLHDL", "LDLDIRECT" in the last 72 hours. Thyroid  Function Tests: No results for input(s): "TSH", "T4TOTAL", "FREET4", "T3FREE", "THYROIDAB" in the last 72 hours. Anemia Panel: Recent Labs    06/14/23 0858  VITAMINB12 986*  FOLATE 28.1  FERRITIN 69  TIBC 255  IRON 81  RETICCTPCT 1.1   Sepsis Labs: Recent Labs  Lab 06/12/23 2258 06/13/23 0141 06/14/23 0406  LATICACIDVEN 2.2* 2.1* 1.4   Recent Results (from the past 240 hours)  Urine Culture     Status: Abnormal   Collection Time: 06/05/23  8:05 PM   Specimen: Urine, Catheterized  Result Value Ref Range Status   Specimen Description   Final    URINE, CATHETERIZED Performed at [facility], [address] [place], [place] [address]    Special Requests   Final    NONE Performed at [facility], [address], [place], [place] [address]    Culture >=100,000 COLONIES/mL ESCHERICHIA COLI (A)  Final   Report Status 06/08/2023 FINAL  Final   Organism ID, Bacteria ESCHERICHIA COLI (A)  Final      Susceptibility   Escherichia coli - MIC*    AMPICILLIN 4 SENSITIVE Sensitive     CEFAZOLIN <=4 SENSITIVE Sensitive     CEFEPIME <=0.12 SENSITIVE Sensitive     CEFTRIAXONE  <=0.25 SENSITIVE Sensitive     CIPROFLOXACIN  <=0.25 SENSITIVE Sensitive     GENTAMICIN <=1 SENSITIVE Sensitive     IMIPENEM <=0.25 SENSITIVE Sensitive     NITROFURANTOIN <=16 SENSITIVE Sensitive     TRIMETH/SULFA >=320 RESISTANT Resistant     AMPICILLIN/SULBACTAM <=2 SENSITIVE Sensitive     PIP/TAZO <=4 SENSITIVE Sensitive ug/mL    * >=100,000 COLONIES/mL ESCHERICHIA COLI  Resp panel by RT-PCR (RSV, Flu A&B, Covid) Anterior Nasal Swab     Status: None   Collection Time: 06/12/23  10:51 PM   Specimen: Anterior Nasal Swab  Result Value Ref Range Status   SARS  Coronavirus 2 by RT PCR NEGATIVE NEGATIVE Final   Influenza A by PCR NEGATIVE NEGATIVE Final   Influenza B by PCR NEGATIVE NEGATIVE Final    Comment: (NOTE) The Xpert Xpress SARS-CoV-2/FLU/RSV plus assay is intended as an aid in the diagnosis of influenza from Nasopharyngeal swab specimens and should not be used as a sole basis for treatment. Nasal washings and aspirates are unacceptable for Xpert Xpress SARS-CoV-2/FLU/RSV testing.  Fact Sheet for Patients: BloggerCourse.com  Fact Sheet for Healthcare Providers: SeriousBroker.it  This test is not yet approved or cleared by the [place]  FDA and has been authorized for detection and/or diagnosis of SARS-CoV-2 by FDA under an Emergency Use Authorization (EUA). This EUA will remain in effect (meaning this test can be used) for the duration of the COVID-19 declaration under Section 564(b)(1) of the Act, 21 U.S.C. section 360bbb-3(b)(1), unless the authorization is terminated or revoked.     Resp Syncytial Virus by PCR NEGATIVE NEGATIVE Final    Comment: (NOTE) Fact Sheet for Patients: BloggerCourse.com  Fact Sheet for Healthcare Providers: SeriousBroker.it  This test is not yet approved or cleared by the [place]  FDA and has been authorized for detection and/or diagnosis of SARS-CoV-2 by FDA under an Emergency Use Authorization (EUA). This EUA will remain in effect (meaning this test can be used) for the duration of the COVID-19 declaration under Section 564(b)(1) of the Act, 21 U.S.C. section 360bbb-3(b)(1), unless the authorization is terminated or revoked.  Performed at [facility], [address] [place], [place] [address]   Respiratory (~20 pathogens) panel by PCR     Status: None   Collection Time: 06/13/23  8:16 AM    Specimen: Nasopharyngeal Swab; Respiratory  Result Value Ref Range Status   Adenovirus NOT DETECTED NOT DETECTED Final   Coronavirus 229E NOT DETECTED NOT DETECTED Final    Comment: (NOTE) The Coronavirus on the Respiratory Panel, DOES NOT test for the novel  Coronavirus (2019 nCoV)    Coronavirus HKU1 NOT DETECTED NOT DETECTED Final   Coronavirus NL63 NOT DETECTED NOT DETECTED Final   Coronavirus OC43 NOT DETECTED NOT DETECTED Final   Metapneumovirus NOT DETECTED NOT DETECTED Final   Rhinovirus / Enterovirus NOT DETECTED NOT DETECTED Final   Influenza A NOT DETECTED NOT DETECTED Final   Influenza B NOT DETECTED NOT DETECTED Final   Parainfluenza Virus 1 NOT DETECTED NOT DETECTED Final   Parainfluenza Virus 2 NOT DETECTED NOT DETECTED Final   Parainfluenza Virus 3 NOT DETECTED NOT DETECTED Final   Parainfluenza Virus 4 NOT DETECTED NOT DETECTED Final   Respiratory Syncytial Virus NOT DETECTED NOT DETECTED Final   Bordetella pertussis NOT DETECTED NOT DETECTED Final   Bordetella Parapertussis NOT DETECTED NOT DETECTED Final   Chlamydophila pneumoniae NOT DETECTED NOT DETECTED Final   Mycoplasma pneumoniae NOT DETECTED NOT DETECTED Final    Comment: Performed at [facility], [address], [place], [place] [address]  Remove and replace urinary cath (placed > 5 days) then obtain urine culture from new indwelling urinary catheter.     Status: None   Collection Time: 06/13/23 10:28 AM   Specimen: Urine, Catheterized  Result Value Ref Range Status   Specimen Description URINE, CATHETERIZED  Final   Special Requests NONE  Final   Culture   Final    NO GROWTH Performed at [facility], [address] [place], [place] [address]    Report Status 06/14/2023 FINAL  Final    Radiology  Studies: No results found.  Scheduled Meds:  amLODipine   5 mg Oral Daily   aspirin  EC  81 mg Oral Daily   atorvastatin   80 mg Oral Daily   Chlorhexidine  Gluconate Cloth  6 each Topical  Daily   clopidogrel   75 mg Oral Daily   DULoxetine   60 mg Oral Daily   enoxaparin  (LOVENOX ) injection  40 mg Subcutaneous Q24H   ezetimibe   10 mg Oral Daily   famotidine   20 mg Oral QAC breakfast   folic acid   1 mg Oral Daily   insulin  aspart  0-15 Units Subcutaneous TID WC   insulin  glargine-yfgn  10 Units Subcutaneous QHS   metoprolol  succinate  50 mg Oral Daily   OLANZapine   5 mg Oral QHS   tamsulosin   0.4 mg Oral QHS   thiamine   100 mg Oral Daily   traZODone   50 mg Oral QHS   Continuous Infusions:   LOS: 0 days   Aura Leeds, DO [facility] Available via Epic secure chat 7am-7pm After these hours, please refer to coverage provider listed on [email or web address] 06/15/2023, 12:55 PM

## 2023-06-15 NOTE — NC FL2 (Signed)
 [place]  MEDICAID FL2 LEVEL OF CARE FORM     IDENTIFICATION  Patient Name: William Acevedo Birthdate: 04-02-1955 Sex: male Admission Date (Current Location): 06/12/2023  [facility] and [place] Number:  Producer, television/film/video and Address:  [facility] [place], [address] [place], [place] [address]      Provider Number: [id number]  Attending Physician Name and Address:  Eveline Hipps, DO  Relative Name and Phone Number:  Benecio Kluger; Brother; [phone]    Current Level of Care: Hospital Recommended Level of Care: Skilled Nursing Facility Prior Approval Number:    Date Approved/Denied:   PASRR Number: [id number]  Discharge Plan: SNF    Current Diagnoses: Patient Active Problem List   Diagnosis Date Noted   Intractable nausea and vomiting 06/13/2023   Avoid 05/13/2023   Insulin  dependent type 2 diabetes mellitus (HCC) 07/16/2022   Schizoaffective disorder (HCC) 11/10/2021   Alcohol use, unspecified with intoxication, unspecified (HCC) 11/10/2021   Cocaine abuse with intoxication, unspecified (HCC) 11/10/2021   Antisocial personality disorder (HCC) 11/10/2021   Cannabis dependence, continuous abuse (HCC) 11/10/2021   Opioid abuse (HCC) 11/10/2021   Post-traumatic stress disorder, chronic 11/10/2021   Schizophrenia, unspecified (HCC) 11/10/2021   CVA (cerebral vascular accident) (HCC) 07/09/2020   Acute ischemic multifocal multiple vascular territories stroke (HCC) 06/06/2020   Seizure-like activity (HCC) 06/06/2020   Cocaine abuse (HCC)    ARF (acute renal failure) (HCC) 04/14/2020   Acute blood loss anemia 04/14/2020   GI bleed 04/14/2020   Nausea & vomiting 04/14/2020   Acute metabolic encephalopathy 04/14/2020   Hypertensive urgency 04/14/2020   Abdominal pain 04/02/2020   Protein-calorie malnutrition, severe 03/27/2020   Cerebral thrombosis with cerebral infarction 03/26/2020   NSTEMI (non-ST elevated myocardial infarction)  (HCC) 03/25/2020   Secondary diabetes mellitus with HHNC (hyperglycemia hyperosmolar non-ketotic coma) (HCC) 03/25/2020   Elevated troponin    Renal infarction [facility])    Cerebrovascular accident (CVA) (HCC)    Cocaine use disorder, severe, dependence (HCC) 04/08/2019   Major depressive disorder 04/08/2019   Epiglottitis 04/07/2019   Cocaine use 04/07/2019   Acute encephalopathy 04/07/2019   Overdose 07/06/2015   AKI (acute kidney injury) (HCC) 07/06/2015   Polysubstance abuse (HCC) 07/06/2015   DM2 (diabetes mellitus, type 2) (HCC) 07/06/2015   Dehydration    MDD (major depressive disorder), recurrent episode, mild (HCC)    Alcohol use disorder     Orientation RESPIRATION BLADDER Height & Weight     Self, Time  Normal (Room Air) Incontinent, Indwelling catheter Weight: 134 lb 7.7 oz (61 kg) Height:  5\' 6"  (167.6 cm)  BEHAVIORAL SYMPTOMS/MOOD NEUROLOGICAL BOWEL NUTRITION STATUS    Convulsions/Seizures (History of seizure-like activity) Continent Diet (Please see discharge summary)  AMBULATORY STATUS COMMUNICATION OF NEEDS Skin   Extensive Assist Verbally Normal                       Personal Care Assistance Level of Assistance  Bathing, Feeding, Dressing Bathing Assistance: Maximum assistance Feeding assistance: Maximum assistance Dressing Assistance: Maximum assistance     Functional Limitations Info  Sight Sight Info: Impaired (R and L (Eyeglasses))        SPECIAL CARE FACTORS FREQUENCY  PT (By licensed PT), OT (By licensed OT)     PT Frequency: 5x OT Frequency: 5x            Contractures Contractures Info: Not present    Additional Factors Info  Code Status, Allergies,  Isolation Precautions Code Status Info: Full Code Allergies Info: Lisinopril and Omeprazole     Isolation Precautions Info: Enteric precautions (UV disinfection)     Current Medications (06/15/2023):  This is the current hospital active medication list Current Facility-Administered  Medications  Medication Dose Route Frequency Provider Last Rate Last Admin   amLODipine  (NORVASC ) tablet 5 mg  5 mg Oral Daily Arne Langdon, MD   5 mg at 06/14/23 1046   aspirin  EC tablet 81 mg  81 mg Oral Daily Arne Langdon, MD   81 mg at 06/14/23 1045   atorvastatin  (LIPITOR ) tablet 80 mg  80 mg Oral Daily Arne Langdon, MD   80 mg at 06/14/23 1045   Chlorhexidine  Gluconate Cloth 2 % PADS 6 each  6 each Topical Daily Arne Langdon, MD   6 each at 06/14/23 1046   clopidogrel  (PLAVIX ) tablet 75 mg  75 mg Oral Daily Arne Langdon, MD   75 mg at 06/14/23 1045   DULoxetine  (CYMBALTA ) DR capsule 60 mg  60 mg Oral Daily Arne Langdon, MD   60 mg at 06/14/23 1045   enoxaparin  (LOVENOX ) injection 40 mg  40 mg Subcutaneous Q24H Arne Langdon, MD   40 mg at 06/14/23 1046   ezetimibe  (ZETIA ) tablet 10 mg  10 mg Oral Daily Arne Langdon, MD   10 mg at 06/14/23 1045   famotidine  (PEPCID ) tablet 20 mg  20 mg Oral QAC breakfast Arne Langdon, MD   20 mg at 06/14/23 8295   folic acid  (FOLVITE ) tablet 1 mg  1 mg Oral Daily Arne Langdon, MD   1 mg at 06/14/23 1045   insulin  aspart (novoLOG ) injection 0-15 Units  0-15 Units Subcutaneous TID [facility] Arne Langdon, MD   3 Units at 06/15/23 6213   insulin  glargine-yfgn (SEMGLEE ) injection 10 Units  10 Units Subcutaneous QHS Arne Langdon, MD   10 Units at 06/14/23 2148   lactated ringers  infusion   Intravenous Continuous Aura Leeds [place], DO 75 mL/hr at 06/15/23 0009 New Bag at 06/15/23 0009   metoprolol  succinate (TOPROL -XL) 24 hr tablet 50 mg  50 mg Oral Daily Arne Langdon, MD   50 mg at 06/14/23 1045   OLANZapine  (ZYPREXA ) tablet 5 mg  5 mg Oral QHS Arne Langdon, MD   5 mg at 06/14/23 2148   ondansetron  (ZOFRAN ) injection 4 mg  4 mg Intravenous Q8H PRN Arne Langdon, MD       tamsulosin  (FLOMAX ) capsule 0.4 mg  0.4 mg Oral QHS Arne Langdon, MD   0.4 mg at 06/14/23 2148   thiamine  (VITAMIN B1) tablet 100 mg  100 mg Oral Daily Arne Langdon, MD   100 mg at  06/14/23 1045   traZODone  (DESYREL ) tablet 50 mg  50 mg Oral QHS Moore, Willie, MD   50 mg at 06/14/23 2148     Discharge Medications: Please see discharge summary for a list of discharge medications.  Relevant Imaging Results:  Relevant Lab Results:   Additional Information SSN [id number]  Juliane Och, LCSW

## 2023-06-15 NOTE — TOC Progression Note (Addendum)
 Transition of Care [facility]) - Progression Note    Patient Details  Name: William Acevedo MRN: [id number] Date of Birth: Apr 27, 1955  Transition of [facility]) CM/SW Contact  Juliane Och, LCSW Phone Number: 06/15/2023, 10:25 AM  Clinical Narrative:     10:25 AM CSW submitted fl2 to [name] for STR. CSW inquired SNF admissions about bed availability and insurance authorization needs.  2:32 PM [facility] SNF admissions requested CSW to submit insurance authorization with [facility]. CSW submitted SNF insurance authorization which is currently pending (ID [id number]).  Expected Discharge Plan: Skilled Nursing Facility Barriers to Discharge: Continued Medical Work up  Expected Discharge Plan and Services In-house Referral: Clinical Social Work   Post Acute Care Choice: Skilled Nursing Facility Living arrangements for the past 2 months: Skilled Nursing Facility                                       Social Determinants of Health (SDOH) Interventions SDOH Screenings   Food Insecurity: Patient Unable To Answer (06/13/2023)  Housing: Patient Unable To Answer (06/13/2023)  Transportation Needs: Patient Unable To Answer (06/13/2023)  Utilities: Patient Unable To Answer (06/13/2023)  Social Connections: Patient Unable To Answer (06/13/2023)  Tobacco Use: High Risk (06/12/2023)    Readmission Risk Interventions     No data to display

## 2023-06-15 NOTE — Evaluation (Signed)
 Occupational Therapy Evaluation Patient Details Name: William Acevedo MRN: [id number] DOB: 03-25-1955 Today's Date: 06/15/2023   History of Present Illness   Pt is a 68 y/o M admitted on 06/12/23 after presenting with intractable N&V.PMH: HTN, HLD, DM2, CVA, PTSD, cocaine use, depression, EtOH abuse, neuropathy     Clinical Impressions Pt presented in room yelling out and noted to have what appeared to be vomit versus spitting out oatmeal on chest. Pt at this time could not report PLOF or what they completed at SNF. He required max to total for all adls due to lethargic. Pt would attempt to reach out with cues to raise UE when changin pt but then would leave in space until cued to bring back down. Patient will benefit from continued inpatient follow up therapy, <3 hours/day      If plan is discharge home, recommend the following:   Two people to help with walking and/or transfers;Two people to help with bathing/dressing/bathroom;Assistance with cooking/housework;Assistance with feeding;Direct supervision/assist for medications management;Direct supervision/assist for financial management;Assist for transportation;Supervision due to cognitive status     Functional Status Assessment   Patient has had a recent decline in their functional status and demonstrates the ability to make significant improvements in function in a reasonable and predictable amount of time.     Equipment Recommendations   Other (comment) (TBD)     Recommendations for Other Services         Precautions/Restrictions   Precautions Precautions: Fall Recall of Precautions/Restrictions: Impaired Restrictions Weight Bearing Restrictions Per Provider Order: No     Mobility Bed Mobility               General bed mobility comments: deffered    Transfers                   General transfer comment: deffered      Balance                                            ADL either performed or assessed with clinical judgement   ADL Overall ADL's : Needs assistance/impaired Eating/Feeding: Independent;Sitting   Grooming: Maximal assistance;Sitting   Upper Body Bathing: Maximal assistance;Sitting   Lower Body Bathing: Maximal assistance;Sit to/from stand   Upper Body Dressing : Maximal assistance   Lower Body Dressing: Maximal assistance                       Vision         Perception         Praxis         Pertinent Vitals/Pain Pain Assessment Pain Assessment: No/denies pain     Extremity/Trunk Assessment Upper Extremity Assessment Upper Extremity Assessment: Generalized weakness;Difficult to assess due to impaired cognition   Lower Extremity Assessment Lower Extremity Assessment: Defer to PT evaluation       Communication Communication Communication: Impaired Factors Affecting Communication: Difficulty expressing self;Reduced clarity of speech   Cognition Arousal: Obtunded Behavior During Therapy: Flat affect Cognition: Cognition impaired, Difficult to assess   Orientation impairments: Situation, Time Awareness: Intellectual awareness impaired, Online awareness impaired Memory impairment (select all impairments): Short-term memory, Working Civil [employer] fast streamer, Conservation officer, historic buildings, Non-declarative long-term Biochemist, clinical functioning impairment (select all impairments): Initiation, Organization, Sequencing, Reasoning, Problem solving  Following commands: Impaired Following commands impaired: Follows one step commands with increased time, Follows one step commands inconsistently     Cueing  General Comments   Cueing Techniques: Verbal cues      Exercises     Shoulder Instructions      Home Living Family/patient expects to be discharged to:: Skilled nursing facility                                 Additional Comments: Pt from SNF, unable to ask recent level of  function      Prior Functioning/Environment Prior Level of Function : Patient poor historian/Family not available                    OT Problem List: Decreased strength;Decreased range of motion;Decreased activity tolerance;Impaired balance (sitting and/or standing);Decreased safety awareness;Decreased knowledge of use of DME or AE   OT Treatment/Interventions: Self-care/ADL training;Therapeutic exercise;DME and/or AE instruction;Therapeutic activities;Patient/family education;Balance training      OT Goals(Current goals can be found in the care plan section)   Acute Rehab OT Goals Patient Stated Goal: none OT Goal Formulation: With patient Time For Goal Achievement: 06/29/23 Potential to Achieve Goals: Fair   OT Frequency:  Min 1X/week    Co-evaluation              AM-PAC OT "6 Clicks" Daily Activity     Outcome Measure Help from another person eating meals?: A Little Help from another person taking care of personal grooming?: A Lot Help from another person toileting, which includes using toliet, bedpan, or urinal?: Total Help from another person bathing (including washing, rinsing, drying)?: Total Help from another person to put on and taking off regular upper body clothing?: Total Help from another person to put on and taking off regular lower body clothing?: Total 6 Click Score: 9   End of Session Nurse Communication: Mobility status  Activity Tolerance: Patient limited by lethargy Patient left: in bed;with call bell/phone within reach;with bed alarm set  OT Visit Diagnosis: Unsteadiness on feet (R26.81);Other abnormalities of gait and mobility (R26.89);Repeated falls (R29.6);Muscle weakness (generalized) (M62.81)                Time: 4098-1191 OT Time Calculation (min): 14 min Charges:  OT General Charges $OT Visit: 1 Visit OT Evaluation $OT Eval Low Complexity: 1 Low  Erving Heather OTR/L  Acute Rehab Services  [phone] office number   Stevphen Elders 06/15/2023, 9:58 AM

## 2023-06-16 DIAGNOSIS — R3989 Other symptoms and signs involving the genitourinary system: Secondary | ICD-10-CM | POA: Diagnosis not present

## 2023-06-16 DIAGNOSIS — R197 Diarrhea, unspecified: Secondary | ICD-10-CM

## 2023-06-16 DIAGNOSIS — R112 Nausea with vomiting, unspecified: Secondary | ICD-10-CM | POA: Diagnosis not present

## 2023-06-16 LAB — CBC WITH DIFFERENTIAL/PLATELET
Abs Immature Granulocytes: 0.02 10*3/uL (ref 0.00–0.07)
Basophils Absolute: 0 10*3/uL (ref 0.0–0.1)
Basophils Relative: 0 %
Eosinophils Absolute: 0.2 10*3/uL (ref 0.0–0.5)
Eosinophils Relative: 3 %
HCT: 32.5 % — ABNORMAL LOW (ref 39.0–52.0)
Hemoglobin: 10.6 g/dL — ABNORMAL LOW (ref 13.0–17.0)
Immature Granulocytes: 0 %
Lymphocytes Relative: 30 %
Lymphs Abs: 2.1 10*3/uL (ref 0.7–4.0)
MCH: 27.2 pg (ref 26.0–34.0)
MCHC: 32.6 g/dL (ref 30.0–36.0)
MCV: 83.5 fL (ref 80.0–100.0)
Monocytes Absolute: 0.6 10*3/uL (ref 0.1–1.0)
Monocytes Relative: 8 %
Neutro Abs: 4 10*3/uL (ref 1.7–7.7)
Neutrophils Relative %: 59 %
Platelets: 307 10*3/uL (ref 150–400)
RBC: 3.89 MIL/uL — ABNORMAL LOW (ref 4.22–5.81)
RDW: 13.2 % (ref 11.5–15.5)
WBC: 6.8 10*3/uL (ref 4.0–10.5)
nRBC: 0 % (ref 0.0–0.2)

## 2023-06-16 LAB — COMPREHENSIVE METABOLIC PANEL WITH GFR
ALT: 14 U/L (ref 0–44)
AST: 14 U/L — ABNORMAL LOW (ref 15–41)
Albumin: 2.7 g/dL — ABNORMAL LOW (ref 3.5–5.0)
Alkaline Phosphatase: 43 U/L (ref 38–126)
Anion gap: 7 (ref 5–15)
BUN: 28 mg/dL — ABNORMAL HIGH (ref 8–23)
CO2: 23 mmol/L (ref 22–32)
Calcium: 8.2 mg/dL — ABNORMAL LOW (ref 8.9–10.3)
Chloride: 104 mmol/L (ref 98–111)
Creatinine, Ser: 0.97 mg/dL (ref 0.61–1.24)
GFR, Estimated: >60 mL/min (ref >60)
Glucose, Bld: 241 mg/dL — ABNORMAL HIGH (ref 70–99)
Potassium: 3.8 mmol/L (ref 3.5–5.1)
Sodium: 134 mmol/L — ABNORMAL LOW (ref 135–145)
Total Bilirubin: 0.3 mg/dL (ref 0.0–1.2)
Total Protein: 5.9 g/dL — ABNORMAL LOW (ref 6.5–8.1)

## 2023-06-16 LAB — GLUCOSE, CAPILLARY
Glucose-Capillary: 292 mg/dL — ABNORMAL HIGH (ref 70–99)
Glucose-Capillary: 171 mg/dL — ABNORMAL HIGH (ref 70–99)
Glucose-Capillary: 122 mg/dL — ABNORMAL HIGH (ref 70–99)

## 2023-06-16 LAB — MAGNESIUM: Magnesium: 2.3 mg/dL (ref 1.7–2.4)

## 2023-06-16 LAB — PHOSPHORUS: Phosphorus: 3.6 mg/dL (ref 2.5–4.6)

## 2023-06-16 MED ORDER — INSULIN GLARGINE-YFGN 100 UNIT/ML ~~LOC~~ SOLN
15.0000 [IU] | Freq: Every day | SUBCUTANEOUS | Status: DC
  Filled 2023-06-16: qty 0.15

## 2023-06-16 MED ORDER — ONDANSETRON HCL 4 MG PO TABS: 4.0000 mg | ORAL_TABLET | Freq: Three times a day (TID) | ORAL | 0 refills | Status: DC | PRN

## 2023-06-16 NOTE — Plan of Care (Signed)

## 2023-06-16 NOTE — TOC Transition Note (Signed)
 Transition of Care [name]) - Discharge Note   Patient Details  Name: William Acevedo MRN: [id number] Date of Birth: February 13, 1955  Transition of Care [facility]) CM/SW Contact:  Juliane Och, LCSW Phone Number: 06/16/2023, 12:55 PM   Clinical Narrative:     Patient will DC to: [name] Anticipated DC date: 06/16/2023 Family notified: [name]; Brother; [phone] Transport by: [name]   Per MD patient ready for DC to [name]. RN to call report prior to discharge [phone] ext. [phone]). RN, patient's family, and facility notified of DC (patient is not fully oriented). Discharge Summary and FL2 sent to facility. DC packet on chart. Ambulance transport requested for patient at 12:54.   CSW will sign off for now as social work intervention is no longer needed. Please consult us  again if new needs arise.    Final next level of care: Skilled Nursing Facility Barriers to Discharge: Barriers Resolved   Patient Goals and CMS Choice            Discharge Placement              Patient chooses bed at: [name] Patient to be transferred to facility by: [name] Name of family member notified: [name]; Brother; [phone] Patient and family notified of of transfer: 06/16/23  Discharge Plan and Services Additional resources added to the After Visit Summary for   In-house Referral: Clinical Social Work   Post Acute Care Choice: Skilled Nursing Facility                               Social Drivers of Health (SDOH) Interventions SDOH Screenings   Food Insecurity: Patient Unable To Answer (06/13/2023)  Housing: Patient Unable To Answer (06/13/2023)  Transportation Needs: Patient Unable To Answer (06/13/2023)  Utilities: Patient Unable To Answer (06/13/2023)  Social Connections: Patient Unable To Answer (06/13/2023)  Tobacco Use: High Risk (06/12/2023)     Readmission Risk Interventions     No data to display

## 2023-06-16 NOTE — Inpatient Diabetes Management (Signed)
 Inpatient Diabetes Program Recommendations  AACE/ADA: New Consensus Statement on Inpatient Glycemic Control (2015)  Target Ranges:  Prepandial:   less than 140 mg/dL      Peak postprandial:   less than 180 mg/dL (1-2 hours)      Critically ill patients:  140 - 180 mg/dL   Lab Results  Component Value Date   GLUCAP 292 (H) 06/16/2023   HGBA1C 8.6 (H) 05/13/2023    Review of Glycemic Control  Latest Reference Range & Units 06/15/23 07:47 06/15/23 12:12 06/15/23 17:27 06/15/23 22:31 06/16/23 07:54  Glucose-Capillary 70 - 99 mg/dL 191 (H) 478 (H) 295 (H) 166 (H) 292 (H)  (H): Data is abnormally high Diabetes history: Type 2 DM Outpatient Diabetes medications: Farxiga  10 mg every day, Lantus  20 units at bedtime, Humalog  0-10 units TID, Metformin  [phone] mg BID Current orders for Inpatient glycemic control: Semglee  10 units at bedtime, Novolog  0-15 units TID  Inpatient Diabetes Program Recommendations:    Consider further increasing Semglee  15 units at bedtime  Thanks, Marjo Sievert, MSN, RNC-OB Diabetes Coordinator [phone] (8a-5p)

## 2023-06-16 NOTE — Discharge Summary (Signed)
 Physician Discharge Summary   Patient: William Acevedo MRN: [id number] DOB: 11-Jan-1956  Admit date:     06/12/2023  Discharge date: 06/16/23  Discharge Physician: Aura Leeds, DO   PCP: Maryellen Snare, NP   Recommendations at discharge:   Follow up with PCP w/in 1-2 weeks and repeat CBC, CMP, Mag, Phos w/in 1 week  Have TOV in the outpatient setting with Urology Follow Up with Neurology in the outpt Setting   Discharge Diagnoses: Principal Problem:   Intractable nausea and vomiting  Resolved Problems:   * No resolved hospital problems. [facility] Course: The patient is a 68 year old AAM past medical history significant for hypertension, hyperlipidemia, diabetes mellitus type 2, history of CVA, history of PTSD, with depression anxiety as well as cocaine use who presents with intractable nausea vomiting diarrhea.  Patient presented with 1 day history of nausea vomiting and diarrhea and per his brother states he is not able to hold anything down and had some worsening abdominal pain.  Currently there is an illness going around we will facility.  He was admitted for dehydration and suspected caudae but UTI has been ruled out as urine culture showing no growth.  Intractable nausea vomiting is improving and he still having some diarrheal episodes.  Will send off GI pathogen panel.  PT OT recommending SNF once medically stable but will resume IV fluid hydration today.  Respiratory virus panel is negative and will need to follow-up on GI pathogen panel but he had No further diarrheal episodes. He is improved and appears baseline as symptoms of N/V/D have resolved   Assessment and Plan:  Intractable Nausea/Vomiting/Diarrhea, improving  Elevated Lactic Acid, improved Possible CAUTI, ruled out -Pt with N/V/D and elevated lactic acid likely reflecting dehydration. ? Viral Gastroenteritis vs Food Poisioning. Improving.  -Patient had a urinalysis done which showed clear appearance, straw-colored  urine, greater than 500 glucose, moderate hemoglobin, 80 ketones, negative leukocytes, nitrates, 100 protein, no bacteria seen, with 0-5 RBCs per high-power field and 0-5 WBCs; Initially there was concern for UTI and now is s/p 3 doses of IV Ceftriaxone  but Urine Cx showing No Growth so we will discontinue antibiotics altogether -MIVF: Was on LR at 100cc/h for 24h followed by 75 mL/h for a day but now IVF stopped -C/w Supportive Care and C/w Antiemtics with Ondansetron  4 mg IV q8hprn Nausea/Vomiting  -LA trended done and resolved and went from 2.1 -> 1.4 -Had Urinary Retention so that is why Foley catheter is in place. Have TOV in outpatient setting  -RVP Negative; GI Pathogen Panel pending but nursing states he has had no further Diarrheal episodes or Bowel movements. -PT OT recommending SNF   Essential HTN: C/w  Amlodipine  5 mg daily. CTM BP per Protocol. Last BP reading was 129/66   HLD: C/w Atorvastatin  80mg  daily and Ezetimibe  10 mg p.o. daily   DM Type 2: C/w Semglee  but increase 10U at bedtime to 15U @ Bedtime + Moderate Novolog  SSI TID AC; CTM CBGs per Protocol and they are ranging from 166-292 on the last 5 checks  Hyponatremia: Mild. Na+ went from 144 -> 135 -> 134. again CTM and Trend and Repeat CMP in the AM   H/o CVA: C/w ASA 81 mg po daily, Clopidogrel  75 mg po Dauly, and Atorvastatin  80 mg po Daily. Follow up with Neurology in the outpatient setting.    PTSD/Depression/Anxiety: C/w Duloxetine  60 mg po Daily, Olanzapine  5 mg po qHS, and Trazodone  50 mg po at bedtime  Elevated BUN: Mild. No S/Sx of Upper GIB. CTM and Trend and repeat CMP w/in 1 week  Elevated Anion Gap Metabolic Acidosis: Mild and improved. Patient's CO2 is now 23, AG is 7, Chloride Level is 104. CTM and Trend and Repeat CMP in the AM  Normocytic Anemia: Hgb/Hct is now 10.6/32.5 but stable. Check Anemia Panel and showed an iron level of 81, UIBC 174, TIBC of 255, saturation ration of 32%, ferritin level of 69,  folate of 28.1 and vitamin B12 986. CTM for S/Sx of Bleeding; No overt bleeding noted. Repeat CBC w/in 1 week  Thrombocytosis: Mild and improved.  Likely Reactive in the setting of Above. Plt Count is now gone from 435 -> 389 -> 339 -> 307. CTM and Trend and Repeat CBC in the AM   Hypoalbuminemia: Patient's Albumin Level went from 3.6 -> 3.2 -> 2.7 again. CTM and Trend and Repeat CMP in the AM  Consultants: None Procedures performed: As delineated as above   Disposition: Skilled nursing facility Diet recommendation:  Cardiac and Carb modified diet DISCHARGE MEDICATION: Allergies as of 06/16/2023       Reactions   Lisinopril Swelling, Other (See Comments)   Angioedema and the patient stated it made his lips swell Pt reports not allergic 06/07/20   Omeprazole Swelling, Other (See Comments)   ANGIOEDEMA OF LIPS, Angioedema of tongue Pt reports not allergic 06/07/20        Medication List     TAKE these medications    amLODipine  5 MG tablet Commonly known as: NORVASC  Take 1 tablet (5 mg total) by mouth daily.   aspirin  EC 81 MG tablet Take 1 tablet (81 mg total) by mouth daily. Swallow whole.   atorvastatin  80 MG tablet Commonly known as: LIPITOR  Take 1 tablet (80 mg total) by mouth daily.   clopidogrel  75 MG tablet Commonly known as: PLAVIX  Take 1 tablet by mouth daily.   dapagliflozin  propanediol 10 MG Tabs tablet Commonly known as: FARXIGA  Take 1 tablet by mouth daily.   DULoxetine  60 MG capsule Commonly known as: CYMBALTA  Take 1 capsule (60 mg total) by mouth daily. For chronic pain and mood   ezetimibe  10 MG tablet Commonly known as: ZETIA  Take 1 tablet (10 mg total) by mouth daily.   famotidine  20 MG tablet Commonly known as: PEPCID  Take 1 tablet (20 mg total) by mouth daily before breakfast.   folic acid  1 MG tablet Commonly known as: FOLVITE  Take 1 tablet by mouth daily.   hydrOXYzine  25 MG tablet Commonly known as: ATARAX  Take 25 mg by mouth at  bedtime.   insulin  glargine 100 UNIT/ML Solostar Pen Commonly known as: LANTUS  Inject 10 Units into the skin daily. Please follow up with your PCP for adjustments to your insulin  regimen What changed:  how much to take when to take this   insulin  lispro 100 UNIT/ML injection Commonly known as: HUMALOG  Inject 0.04 mLs (4 Units total) into the skin 3 (three) times daily with meals. Please follow up with your PCP for adjustments of your insulin  regimen What changed: additional instructions   Lancet Device Misc 1 each by Does not apply route 3 (three) times daily. May dispense any manufacturer covered by patient's insurance.   metFORMIN  500 MG tablet Commonly known as: GLUCOPHAGE  Take 1 tablet (500 mg total) by mouth 2 (two) times daily with a meal. Follow up with your PCP to discuss uptitration of this medicine What changed:  how much to take when to take this  additional instructions   metoprolol  succinate 50 MG 24 hr tablet Commonly known as: TOPROL -XL Take 1 tablet (50 mg total) by mouth daily. Take with or immediately following a meal.   naloxone  4 MG/0.1ML Liqd nasal spray kit Commonly known as: NARCAN  Place 1 spray into the nose once. SPRAY 1 SPRAY INTO ONE NOSTRIL AS DIRECTED FOR OPIOID OVERDOSE - CALL 911 IMMEDIATELY, ADMINISTER DOSE, THEN TURN PERSON ON SIDE - IF NO RESPONSE IN 2-3 MINUTES OR PERSON RESPONDS BUT RELAPSES, REPEAT USING A NEW SPRAY DEVICE AND SPRAY INTO THE OTHER NOSTRIL   OLANZapine  5 MG tablet Commonly known as: ZYPREXA  Take 5 mg by mouth at bedtime.   ondansetron  4 MG tablet Commonly known as: Zofran  Take 1 tablet (4 mg total) by mouth every 8 (eight) hours as needed for nausea or vomiting.   tamsulosin  0.4 MG Caps capsule Commonly known as: FLOMAX  Take 0.4 mg by mouth at bedtime.   thiamine  100 MG tablet Commonly known as: Vitamin B-1 Take 1 tablet (100 mg total) by mouth daily.   traZODone  50 MG tablet Commonly known as: DESYREL  Take 1  tablet (50 mg total) by mouth at bedtime.       Discharge Exam: Filed Weights   06/12/23 2219  Weight: 61 kg   Vitals:   06/16/23 0400 06/16/23 0751  BP: (!) 153/78 129/66  Pulse: 70 71  Resp: 16   Temp: 97.8 F (36.6 C) 98 F (36.7 C)  SpO2: 98% 98%   Examination: Physical Exam:  Constitutional: Thin chronically ill appearing male in NAD appears calm and was a little sleepy and drowsy Respiratory: Diminished to auscultation bilaterally, no wheezing, rales, rhonchi or crackles. Normal respiratory effort and patient is not tachypenic. No accessory muscle use. Unlabored breathing  Cardiovascular: RRR, no murmurs / rubs / gallops. S1 and S2 auscultated. No extremity edema.  Abdomen: Soft, non-tender, non-distended. Bowel sounds positive.  GU: Deferred. Catheter in place Musculoskeletal: No clubbing / cyanosis of digits/nails. No joint deformity upper and lower extremities.  Skin: No rashes, lesions, ulcers on a limited skin evaluation. No induration; Warm and dry.  Neurologic: Drowsy but arousable. CN 2-12 grossly intact.  Psychiatric: Appears calm and not agitated   Condition at discharge: stable  The results of significant diagnostics from this hospitalization (including imaging, microbiology, ancillary and laboratory) are listed below for reference.   Imaging Studies: CT ABDOMEN PELVIS W CONTRAST Result Date: 06/13/2023 CLINICAL DATA:  Left lower quadrant abdominal pain, nausea, vomiting, diarrhea, fever EXAM: CT ABDOMEN AND PELVIS WITH CONTRAST TECHNIQUE: Multidetector CT imaging of the abdomen and pelvis was performed using the standard protocol following bolus administration of intravenous contrast. RADIATION DOSE REDUCTION: This exam was performed according to the departmental dose-optimization program which includes automated exposure control, adjustment of the mA and/or kV according to patient size and/or use of iterative reconstruction technique. CONTRAST:  75mL  OMNIPAQUE  IOHEXOL  350 MG/ML SOLN COMPARISON:  CT 07/16/2022 FINDINGS: Lower chest: No acute abnormality. Hepatobiliary: Unremarkable liver. Normal gallbladder. No biliary dilation. Pancreas: Unremarkable. Spleen: Unremarkable. Adrenals/Urinary Tract: Normal adrenal glands. Bilateral cortical renal scarring, greater on the right. Nonobstructing right nephrolithiasis. Large amount of gas in the bladder. Foley catheter. Diffuse bladder wall thickening. Stomach/Bowel: Normal caliber large and small bowel. Colonic diverticulosis without diverticulitis. Large colonic stool burden. No bowel wall thickening. The appendix is normal.Stomach is within normal limits. Vascular/Lymphatic: Advanced aortic atherosclerotic calcification. No lymphadenopathy. Reproductive: Enlarged prostate. Other: No free intraperitoneal fluid or air. Musculoskeletal: No acute fracture. IMPRESSION:  1. Diffuse bladder wall thickening may be due to underdistention, cystitis, or obstructive uropathy. Large amount of gas and a Foley catheter in the bladder. 2. Colonic diverticulosis without diverticulitis. 3. Nonobstructing right nephrolithiasis. 4. Aortic Atherosclerosis (ICD10-I70.0). Electronically Signed   By: Rozell Cornet M.D.   On: 06/13/2023 01:29   Microbiology: Results for orders placed or performed during the hospital encounter of 06/12/23  Resp panel by RT-PCR (RSV, Flu A&B, Covid) Anterior Nasal Swab     Status: None   Collection Time: 06/12/23 10:51 PM   Specimen: Anterior Nasal Swab  Result Value Ref Range Status   SARS Coronavirus 2 by RT PCR NEGATIVE NEGATIVE Final   Influenza A by PCR NEGATIVE NEGATIVE Final   Influenza B by PCR NEGATIVE NEGATIVE Final    Comment: (NOTE) The Xpert Xpress SARS-CoV-2/FLU/RSV plus assay is intended as an aid in the diagnosis of influenza from Nasopharyngeal swab specimens and should not be used as a sole basis for treatment. Nasal washings and aspirates are unacceptable for Xpert Xpress  SARS-CoV-2/FLU/RSV testing.  Fact Sheet for Patients: BloggerCourse.com  Fact Sheet for Healthcare Providers: SeriousBroker.it  This test is not yet approved or cleared by the [place]  FDA and has been authorized for detection and/or diagnosis of SARS-CoV-2 by FDA under an Emergency Use Authorization (EUA). This EUA will remain in effect (meaning this test can be used) for the duration of the COVID-19 declaration under Section 564(b)(1) of the Act, 21 U.S.C. section 360bbb-3(b)(1), unless the authorization is terminated or revoked.     Resp Syncytial Virus by PCR NEGATIVE NEGATIVE Final    Comment: (NOTE) Fact Sheet for Patients: BloggerCourse.com  Fact Sheet for Healthcare Providers: SeriousBroker.it  This test is not yet approved or cleared by the [place]  FDA and has been authorized for detection and/or diagnosis of SARS-CoV-2 by FDA under an Emergency Use Authorization (EUA). This EUA will remain in effect (meaning this test can be used) for the duration of the COVID-19 declaration under Section 564(b)(1) of the Act, 21 U.S.C. section 360bbb-3(b)(1), unless the authorization is terminated or revoked.  Performed at [facility], [address], [place], [place] [address]   Respiratory (~20 pathogens) panel by PCR     Status: None   Collection Time: 06/13/23  8:16 AM   Specimen: Nasopharyngeal Swab; Respiratory  Result Value Ref Range Status   Adenovirus NOT DETECTED NOT DETECTED Final   Coronavirus 229E NOT DETECTED NOT DETECTED Final    Comment: (NOTE) The Coronavirus on the Respiratory Panel, DOES NOT test for the novel  Coronavirus (2019 nCoV)    Coronavirus HKU1 NOT DETECTED NOT DETECTED Final   Coronavirus NL63 NOT DETECTED NOT DETECTED Final   Coronavirus OC43 NOT DETECTED NOT DETECTED Final   Metapneumovirus NOT DETECTED NOT DETECTED Final    Rhinovirus / Enterovirus NOT DETECTED NOT DETECTED Final   Influenza A NOT DETECTED NOT DETECTED Final   Influenza B NOT DETECTED NOT DETECTED Final   Parainfluenza Virus 1 NOT DETECTED NOT DETECTED Final   Parainfluenza Virus 2 NOT DETECTED NOT DETECTED Final   Parainfluenza Virus 3 NOT DETECTED NOT DETECTED Final   Parainfluenza Virus 4 NOT DETECTED NOT DETECTED Final   Respiratory Syncytial Virus NOT DETECTED NOT DETECTED Final   Bordetella pertussis NOT DETECTED NOT DETECTED Final   Bordetella Parapertussis NOT DETECTED NOT DETECTED Final   Chlamydophila pneumoniae NOT DETECTED NOT DETECTED Final   Mycoplasma pneumoniae NOT DETECTED NOT DETECTED Final    Comment:  Performed at [facility], [address] [place], [place] [address]  Remove and replace urinary cath (placed > 5 days) then obtain urine culture from new indwelling urinary catheter.     Status: None   Collection Time: 06/13/23 10:28 AM   Specimen: Urine, Catheterized  Result Value Ref Range Status   Specimen Description URINE, CATHETERIZED  Final   Special Requests NONE  Final   Culture   Final    NO GROWTH Performed at [facility], [address], [place], [place] [address]    Report Status 06/14/2023 FINAL  Final   Labs: CBC: Recent Labs  Lab 06/12/23 2237 06/13/23 0926 06/14/23 0858 06/15/23 0957 06/16/23 0520  WBC 9.6 9.2 10.3 6.3 6.8  NEUTROABS 8.6*  --  6.9 3.9 4.0  HGB 14.2 11.9* 11.6* 11.2* 10.6*  HCT 44.9 37.6* 35.8* 35.1* 32.5*  MCV 84.7 84.1 83.8 84.4 83.5  PLT 462* 435* 389 339 307   Basic Metabolic Panel: Recent Labs  Lab 06/12/23 2351 06/13/23 0926 06/14/23 0858 06/15/23 0957 06/16/23 0520  NA 144  --  135 134* 134*  K 4.3  --  3.7 4.0 3.8  CL 107  --  103 104 104  CO2 21*  --  25 22 23   GLUCOSE 271*  --  183* 252* 241*  BUN 20  --  17 21 28*  CREATININE 1.18 1.26* 0.95 0.96 0.97  CALCIUM  9.1  --  8.3* 8.1* 8.2*  MG  --   --  2.2 2.2 2.3  PHOS  --   --  2.7 3.5 3.6    Liver Function Tests: Recent Labs  Lab 06/12/23 2351 06/14/23 0858 06/15/23 0957 06/16/23 0520  AST 19 18 17  14*  ALT 16 14 14 14   ALKPHOS 60 51 42 43  BILITOT 0.8 0.8 0.2 0.3  PROT 8.0 7.0 6.0* 5.9*  ALBUMIN 3.6 3.2* 2.7* 2.7*   CBG: Recent Labs  Lab 06/15/23 1212 06/15/23 1727 06/15/23 2231 06/16/23 0754 06/16/23 1217  GLUCAP 201* 229* 166* 292* 171*   Discharge time spent: greater than 30 minutes.  Signed: Aura Leeds, DO [facility] 06/16/2023

## 2023-06-22 ENCOUNTER — Emergency Department (HOSPITAL_COMMUNITY)
Admission: EM | Admit: 2023-06-22 | Discharge: 2023-06-23 | Disposition: A | Discharge status: Home / Self Care | Attending: Emergency Medicine | Admitting: Emergency Medicine

## 2023-06-22 ENCOUNTER — Other Ambulatory Visit: Payer: Self-pay

## 2023-06-22 ENCOUNTER — Encounter (HOSPITAL_COMMUNITY): Payer: Self-pay | Admitting: Emergency Medicine

## 2023-06-22 DIAGNOSIS — Z794 Long term (current) use of insulin: Secondary | ICD-10-CM | POA: Diagnosis not present

## 2023-06-22 DIAGNOSIS — R1030 Lower abdominal pain, unspecified: Secondary | ICD-10-CM | POA: Insufficient documentation

## 2023-06-22 DIAGNOSIS — T83098A Other mechanical complication of other indwelling urethral catheter, initial encounter: Secondary | ICD-10-CM | POA: Diagnosis present

## 2023-06-22 DIAGNOSIS — Z7982 Long term (current) use of aspirin: Secondary | ICD-10-CM | POA: Diagnosis not present

## 2023-06-22 DIAGNOSIS — T839XXA Unspecified complication of genitourinary prosthetic device, implant and graft, initial encounter: Secondary | ICD-10-CM

## 2023-06-22 LAB — COMPREHENSIVE METABOLIC PANEL WITH GFR
ALT: 26 U/L (ref 0–44)
AST: 24 U/L (ref 15–41)
Albumin: 3.8 g/dL (ref 3.5–5.0)
Alkaline Phosphatase: 61 U/L (ref 38–126)
Anion gap: 14 (ref 5–15)
BUN: 22 mg/dL (ref 8–23)
CO2: 18 mmol/L — ABNORMAL LOW (ref 22–32)
Calcium: 9.1 mg/dL (ref 8.9–10.3)
Chloride: 105 mmol/L (ref 98–111)
Creatinine, Ser: 1.55 mg/dL — ABNORMAL HIGH (ref 0.61–1.24)
GFR, Estimated: 48 mL/min — ABNORMAL LOW (ref >60)
Glucose, Bld: 334 mg/dL — ABNORMAL HIGH (ref 70–99)
Potassium: 4.3 mmol/L (ref 3.5–5.1)
Sodium: 137 mmol/L (ref 135–145)
Total Bilirubin: 0.5 mg/dL (ref 0.0–1.2)
Total Protein: 7.8 g/dL (ref 6.5–8.1)

## 2023-06-22 LAB — CBC WITH DIFFERENTIAL/PLATELET
Abs Immature Granulocytes: 0.09 10*3/uL — ABNORMAL HIGH (ref 0.00–0.07)
Basophils Absolute: 0.1 10*3/uL (ref 0.0–0.1)
Basophils Relative: 0 %
Eosinophils Absolute: 0 10*3/uL (ref 0.0–0.5)
Eosinophils Relative: 0 %
HCT: 35.3 % — ABNORMAL LOW (ref 39.0–52.0)
Hemoglobin: 11.1 g/dL — ABNORMAL LOW (ref 13.0–17.0)
Immature Granulocytes: 1 %
Lymphocytes Relative: 10 %
Lymphs Abs: 1.5 10*3/uL (ref 0.7–4.0)
MCH: 27.1 pg (ref 26.0–34.0)
MCHC: 31.4 g/dL (ref 30.0–36.0)
MCV: 86.1 fL (ref 80.0–100.0)
Monocytes Absolute: 1.2 10*3/uL — ABNORMAL HIGH (ref 0.1–1.0)
Monocytes Relative: 8 %
Neutro Abs: 12.5 10*3/uL — ABNORMAL HIGH (ref 1.7–7.7)
Neutrophils Relative %: 81 %
Platelets: 341 10*3/uL (ref 150–400)
RBC: 4.1 MIL/uL — ABNORMAL LOW (ref 4.22–5.81)
RDW: 13.7 % (ref 11.5–15.5)
WBC: 15.3 10*3/uL — ABNORMAL HIGH (ref 4.0–10.5)
nRBC: 0 % (ref 0.0–0.2)

## 2023-06-22 LAB — LIPASE, BLOOD: Lipase: 32 U/L (ref 11–51)

## 2023-06-22 MED ORDER — CEPHALEXIN 500 MG PO CAPS: 500.0000 mg | ORAL_CAPSULE | Freq: Four times a day (QID) | ORAL | 0 refills | Status: DC

## 2023-06-22 MED ORDER — SODIUM CHLORIDE 0.9 % IV BOLUS
1000.0000 mL | Freq: Once | INTRAVENOUS | Status: AC
  Administered 2023-06-22: 1000 mL via INTRAVENOUS

## 2023-06-22 MED ORDER — CEPHALEXIN 500 MG PO CAPS
500.0000 mg | ORAL_CAPSULE | Freq: Once | ORAL | Status: AC
  Administered 2023-06-22: 500 mg via ORAL
  Filled 2023-06-22: qty 1

## 2023-06-22 MED ORDER — LIDOCAINE HCL URETHRAL/MUCOSAL 2 % EX GEL
1.0000 | Freq: Once | CUTANEOUS | Status: AC
  Administered 2023-06-22: 1 via URETHRAL
  Filled 2023-06-22: qty 11

## 2023-06-22 MED ORDER — MORPHINE SULFATE (PF) 4 MG/ML IV SOLN
4.0000 mg | Freq: Once | INTRAVENOUS | Status: AC
  Administered 2023-06-22: 4 mg via INTRAVENOUS
  Filled 2023-06-22: qty 1

## 2023-06-22 NOTE — ED Provider Notes (Signed)
 [place] EMERGENCY DEPARTMENT AT [facility] Provider Note   CSN: [id number] Arrival date & time: 06/22/23  1729     History  Chief Complaint  Patient presents with   Abdominal Pain    William Acevedo is a 68 y.o. male.  68 year old male with prior medical history as detailed below.  He presents from [facility].  He complains of low abdominal pain.  He has a Foley catheter in place.  It is unclear when the catheter was placed.  Exam suggest significantly distended bladder with inadequate drainage from the Foley.  The history is provided by the patient.       Home Medications Prior to Admission medications   Medication Sig Start Date End Date Taking? Authorizing Provider  amLODipine  (NORVASC ) 5 MG tablet Take 1 tablet (5 mg total) by mouth daily. 05/20/23   Haydee Lipa, MD  aspirin  EC 81 MG tablet Take 1 tablet (81 mg total) by mouth daily. Swallow whole. 05/20/23   Haydee Lipa, MD  atorvastatin  (LIPITOR ) 80 MG tablet Take 1 tablet (80 mg total) by mouth daily. 07/19/22 05/13/24  Etter Hermann., MD  clopidogrel  (PLAVIX ) 75 MG tablet Take 1 tablet by mouth daily. 08/29/22   [provider]  dapagliflozin  propanediol (FARXIGA ) 10 MG TABS tablet Take 1 tablet by mouth daily. 08/29/22   [provider]  DULoxetine  (CYMBALTA ) 60 MG capsule Take 1 capsule (60 mg total) by mouth daily. For chronic pain and mood 07/19/22 05/13/24  Etter Hermann., MD  ezetimibe  (ZETIA ) 10 MG tablet Take 1 tablet (10 mg total) by mouth daily. 07/20/22 05/13/24  Etter Hermann., MD  famotidine  (PEPCID ) 20 MG tablet Take 1 tablet (20 mg total) by mouth daily before breakfast. 07/19/22 06/12/24  Etter Hermann., MD  folic acid  (FOLVITE ) 1 MG tablet Take 1 tablet by mouth daily. 08/29/22   [provider]  hydrOXYzine  (ATARAX ) 25 MG tablet Take 25 mg by mouth at bedtime. 04/17/23   [provider]  insulin  glargine (LANTUS ) 100  UNIT/ML Solostar Pen Inject 10 Units into the skin daily. Please follow up with your PCP for adjustments to your insulin  regimen Patient taking differently: Inject 20 Units into the skin at bedtime. Please follow up with your PCP for adjustments to your insulin  regimen 05/20/23   Haydee Lipa, MD  insulin  lispro (HUMALOG ) 100 UNIT/ML injection Inject 0.04 mLs (4 Units total) into the skin 3 (three) times daily with meals. Please follow up with your PCP for adjustments of your insulin  regimen Patient taking differently: Inject 4 Units into the skin 3 (three) times daily with meals. Also given per sliding scale, if 151-200= 2, 201-250= 4, 251-300= 6, 301-350= 8, 351-400= 8, if BG >400, give 10 units Please follow up with your PCP for adjustments of your insulin  regimen 07/19/22   Etter Hermann., MD  Lancet Device MISC 1 each by Does not apply route 3 (three) times daily. May dispense any manufacturer covered by patient's insurance. 07/19/22   Etter Hermann., MD  metFORMIN  (GLUCOPHAGE ) 500 MG tablet Take 1 tablet (500 mg total) by mouth 2 (two) times daily with a meal. Follow up with your PCP to discuss uptitration of this medicine Patient taking differently: Take 500-1,000 mg by mouth See admin instructions. Give 2 tablet by mouth in the morning and 1 tablet at bedtime Follow up with your PCP to discuss uptitration of this medicine 07/19/22 06/12/24  Etter Hermann., MD  metoprolol  succinate (TOPROL -XL) 50 MG 24 hr tablet Take 1 tablet (50 mg total) by mouth daily. Take with or immediately following a meal. 07/19/22 06/12/24  Etter Hermann., MD  naloxone  (NARCAN ) nasal spray 4 mg/0.1 mL Place 1 spray into the nose once. SPRAY 1 SPRAY INTO ONE NOSTRIL AS DIRECTED FOR OPIOID OVERDOSE - CALL 911 IMMEDIATELY, ADMINISTER DOSE, THEN TURN PERSON ON SIDE - IF NO RESPONSE IN 2-3 MINUTES OR PERSON RESPONDS BUT RELAPSES, REPEAT USING A NEW SPRAY DEVICE AND SPRAY INTO THE OTHER  NOSTRIL Patient not taking: Reported on 06/13/2023 03/15/23   [provider]  OLANZapine  (ZYPREXA ) 5 MG tablet Take 5 mg by mouth at bedtime. 04/17/23   [provider]  ondansetron  (ZOFRAN ) 4 MG tablet Take 1 tablet (4 mg total) by mouth every 8 (eight) hours as needed for nausea or vomiting. 06/16/23   Aura Leeds Latif, DO  tamsulosin  (FLOMAX ) 0.4 MG CAPS capsule Take 0.4 mg by mouth at bedtime. 04/17/23   [provider]  thiamine  (VITAMIN B-1) 100 MG tablet Take 1 tablet (100 mg total) by mouth daily. 05/20/23   Haydee Lipa, MD  traZODone  (DESYREL ) 50 MG tablet Take 1 tablet (50 mg total) by mouth at bedtime. 05/20/23 07/19/23  Haydee Lipa, MD      Allergies    Lisinopril and Omeprazole    Review of Systems   Review of Systems  All other systems reviewed and are negative.   Physical Exam Updated Vital Signs BP (!) 182/74   Pulse 92   Temp 98.2 F (36.8 C) (Oral)   Resp (!) 22   Ht 5\' 6"  (1.676 m)   Wt 61 kg   SpO2 98%   BMI 21.71 kg/m  Physical Exam Vitals and nursing note reviewed.  Constitutional:      General: He is not in acute distress.    Appearance: Normal appearance. He is well-developed.  HENT:     Head: Normocephalic and atraumatic.  Eyes:     Conjunctiva/sclera: Conjunctivae normal.     Pupils: Pupils are equal, round, and reactive to light.  Cardiovascular:     Rate and Rhythm: Normal rate and regular rhythm.     Heart sounds: Normal heart sounds.  Pulmonary:     Effort: Pulmonary effort is normal. No respiratory distress.     Breath sounds: Normal breath sounds.  Abdominal:     General: There is no distension.     Palpations: Abdomen is soft.     Tenderness: There is abdominal tenderness in the suprapubic area.     Comments: Distended bladder.  Foley in place.  Foley is not draining.  Musculoskeletal:        General: No deformity. Normal range of motion.     Cervical back: Normal range of motion and neck  supple.  Skin:    General: Skin is warm and dry.  Neurological:     General: No focal deficit present.     Mental Status: He is alert and oriented to person, place, and time.     ED Results / Procedures / Treatments   Labs (all labs ordered are listed, but only abnormal results are displayed) Labs Reviewed  CBC WITH DIFFERENTIAL/PLATELET - Abnormal; Notable for the following components:      Result Value   WBC 15.3 (*)    RBC 4.10 (*)    Hemoglobin 11.1 (*)    HCT 35.3 (*)  Neutro Abs 12.5 (*)    Monocytes Absolute 1.2 (*)    Abs Immature Granulocytes 0.09 (*)    All other components within normal limits  COMPREHENSIVE METABOLIC PANEL WITH GFR - Abnormal; Notable for the following components:   CO2 18 (*)    Glucose, Bld 334 (*)    Creatinine, Ser 1.55 (*)    GFR, Estimated 48 (*)    All other components within normal limits  LIPASE, BLOOD    EKG None  Radiology No results found.  Procedures Procedures    Medications Ordered in ED Medications  lidocaine  (XYLOCAINE ) 2 % jelly 1 Application (has no administration in time range)  sodium chloride  0.9 % bolus 1,000 mL (has no administration in time range)  morphine  (PF) 4 MG/ML injection 4 mg (4 mg Intravenous Given 06/22/23 1818)    ED Course/ Medical Decision Making/ A&P                                 Medical Decision Making Amount and/or Complexity of Data Reviewed Labs: ordered.  Risk Prescription drug management.    Medical Screen Complete  This patient presented to the ED with complaint of abdominal pain, Foley catheter dysfunction.  This complaint involves an extensive number of treatment options. The initial differential diagnosis includes, but is not limited to, Foley catheter blockage, AKI, metabolic abnormality, etc.  This presentation is: Acute, Chronic, Self-Limited, Previously Undiagnosed, Uncertain Prognosis, Complicated, Systemic Symptoms, and Threat to Life/Bodily Function  Patient  with chronic indwelling Foley.  Catheter was clogged.  Patient with significant bladder blockage and associated pain.  Flushing of catheter was attempted but was unsuccessful.  Catheter was changed.  After catheter change bladder decompressed.  Patient felt much better.  Obtained labs are suggestive of mild AKI related to bladder obstruction.  After Foley was successfully placed catheter drainage was observed.  It appears to be draining well.  Patient is taking p.o. well.  He is comfortable.  He was given IV fluids here in the ED.  Patient is appropriate for discharge back to [facility].  Course of antibiotics prescribed for treatment after urologic intervention tonight.  Strict return precautions given and understood.  Importance of close follow-up stressed.  Additional history obtained:  External records from outside sources obtained and reviewed including prior ED visits and prior Inpatient records.  Problem List / ED Course:  Foley catheter dysfunction, domino pain   Reevaluation:  After the interventions noted above, I reevaluated the patient and found that they have: resolved  Disposition:  After consideration of the diagnostic results and the patients response to treatment, I feel that the patent would benefit from close outpatient follow-up.          Final Clinical Impression(s) / ED Diagnoses Final diagnoses:  Lower abdominal pain  Foley catheter problem, initial encounter [facility])    Rx / DC Orders ED Discharge Orders          Ordered    cephALEXin  (KEFLEX ) 500 MG capsule  4 times daily        06/22/23 2154              Burnette Carte, MD 06/22/23 2157

## 2023-06-22 NOTE — ED Notes (Signed)
 Urine initially was red in color due to presence of blood. After flushing per ED Provider urine became cloudy but yellow in color

## 2023-06-22 NOTE — ED Triage Notes (Signed)
 Pt. BIBEMS from SN[facility] w/ c/o abdominal pain. Pt. Has catheter in place. Abd is distended. No BM. No gas passed.   Per EMS: CBG 373

## 2023-06-22 NOTE — Discharge Instructions (Addendum)
 Return for any problem.  Your Foley catheter was clogged.  It was replaced without difficulty.  After replacement your urine is draining nicely.  It is important to complete a course of antibiotics (Keflex ) out of concern for possible infection given urologic instrumentation today.  Return if you experience significant lower abdominal pain, fever, trouble with urine draining from your catheter.

## 2023-06-23 NOTE — ED Notes (Signed)
Report given to cindy

## 2023-06-23 NOTE — ED Notes (Signed)
 Attempted to give report, took 1/4 cell phone number down for callback

## 2023-06-25 ENCOUNTER — Emergency Department (HOSPITAL_COMMUNITY)
Admission: EM | Admit: 2023-06-25 | Discharge: 2023-06-25 | Disposition: A | Discharge status: Home / Self Care | Attending: Emergency Medicine | Admitting: Emergency Medicine

## 2023-06-25 ENCOUNTER — Encounter (HOSPITAL_COMMUNITY): Payer: Self-pay | Admitting: Emergency Medicine

## 2023-06-25 DIAGNOSIS — Z794 Long term (current) use of insulin: Secondary | ICD-10-CM | POA: Insufficient documentation

## 2023-06-25 DIAGNOSIS — Z7982 Long term (current) use of aspirin: Secondary | ICD-10-CM | POA: Insufficient documentation

## 2023-06-25 DIAGNOSIS — Z466 Encounter for fitting and adjustment of urinary device: Secondary | ICD-10-CM | POA: Diagnosis present

## 2023-06-25 MED ORDER — FENTANYL CITRATE PF 50 MCG/ML IJ SOSY
50.0000 ug | PREFILLED_SYRINGE | Freq: Once | INTRAMUSCULAR | Status: AC
  Administered 2023-06-25: 50 ug via INTRAMUSCULAR
  Filled 2023-06-25: qty 1

## 2023-06-25 MED ORDER — LIDOCAINE HCL URETHRAL/MUCOSAL 2 % EX GEL
1.0000 | Freq: Once | CUTANEOUS | Status: AC
  Administered 2023-06-25: 1 via TOPICAL
  Filled 2023-06-25: qty 11

## 2023-06-25 NOTE — Discharge Instructions (Addendum)
 We placed a new foley in William Acevedo's bladder to prevent stricture and scaring from the old foley removal.  Please have him follow up with urology in 1-2 weeks for a symptom recheck. There will likely be some bloody urine for the next 2-3 days.

## 2023-06-25 NOTE — ED Notes (Signed)
 Called [facility], nurse will call back for report

## 2023-06-25 NOTE — ED Notes (Signed)
 Attempted report again. Waiting for someone to call back to give report to

## 2023-06-25 NOTE — ED Provider Triage Note (Signed)
 Emergency Medicine Provider Triage Evaluation Note  William Acevedo , a 68 y.o. male  was evaluated in triage.  Pt complains of pain after pulling out foley catheter at nursing facility.  Review of Systems  Positive: Penis pain Negative: Fever  Physical Exam  BP 123/71   Pulse 71   Temp 98.6 F (37 C) (Oral)   Resp 16   SpO2 100%  Gen:   Awake, no distress   Resp:  Normal effort  MSK:   Moves extremities without difficulty  Other:  Blood urethral meatus, no active bleeding  Medical Decision Making  Medically screening exam initiated at 1:56 PM.  Appropriate orders placed.  William Acevedo was informed that the remainder of the evaluation will be completed by another provider, this initial triage assessment does not replace that evaluation, and the importance of remaining in the ED until their evaluation is complete.  Foley traumatic removal   Arvilla Birmingham, MD 06/25/23 1357

## 2023-06-25 NOTE — ED Notes (Signed)
 Pt able to urinate into urinal, 300 cc.

## 2023-06-25 NOTE — ED Triage Notes (Signed)
 Pt arrives via EMS from [facility] with reports of removing foley catheter out today.

## 2023-06-25 NOTE — ED Provider Notes (Signed)
 [place] EMERGENCY DEPARTMENT AT [facility] Provider Note   CSN: [id number] Arrival date & time: 06/25/23  1341     History  No chief complaint on file.   William Acevedo is a 68 y.o. male with a history of encephalopathy presenting from [facility] nursing facility with concern for traumatic accidental removal of the patient's Foley.  Patient has had a Foley placed in the past, per my review of external records, for urinary retention and bladder obstruction.  He was just discharged from the hospital 10 days ago to [facility].  HPI     Home Medications Prior to Admission medications   Medication Sig Start Date End Date Taking? Authorizing Provider  amLODipine  (NORVASC ) 5 MG tablet Take 1 tablet (5 mg total) by mouth daily. 05/20/23   Haydee Lipa, MD  aspirin  EC 81 MG tablet Take 1 tablet (81 mg total) by mouth daily. Swallow whole. 05/20/23   Haydee Lipa, MD  atorvastatin  (LIPITOR ) 80 MG tablet Take 1 tablet (80 mg total) by mouth daily. 07/19/22 05/13/24  Etter Hermann., MD  cephALEXin  (KEFLEX ) 500 MG capsule Take 1 capsule (500 mg total) by mouth 4 (four) times daily. 06/22/23   Burnette Carte, MD  clopidogrel  (PLAVIX ) 75 MG tablet Take 1 tablet by mouth daily. 08/29/22   [provider]  dapagliflozin  propanediol (FARXIGA ) 10 MG TABS tablet Take 1 tablet by mouth daily. 08/29/22   [provider]  DULoxetine  (CYMBALTA ) 60 MG capsule Take 1 capsule (60 mg total) by mouth daily. For chronic pain and mood 07/19/22 05/13/24  Etter Hermann., MD  ezetimibe  (ZETIA ) 10 MG tablet Take 1 tablet (10 mg total) by mouth daily. 07/20/22 05/13/24  Etter Hermann., MD  famotidine  (PEPCID ) 20 MG tablet Take 1 tablet (20 mg total) by mouth daily before breakfast. 07/19/22 06/12/24  Etter Hermann., MD  folic acid  (FOLVITE ) 1 MG tablet Take 1 tablet by mouth daily. 08/29/22   [provider]  hydrOXYzine  (ATARAX ) 25 MG tablet  Take 25 mg by mouth at bedtime. 04/17/23   [provider]  insulin  glargine (LANTUS ) 100 UNIT/ML Solostar Pen Inject 10 Units into the skin daily. Please follow up with your PCP for adjustments to your insulin  regimen Patient taking differently: Inject 20 Units into the skin at bedtime. Please follow up with your PCP for adjustments to your insulin  regimen 05/20/23   Haydee Lipa, MD  insulin  lispro (HUMALOG ) 100 UNIT/ML injection Inject 0.04 mLs (4 Units total) into the skin 3 (three) times daily with meals. Please follow up with your PCP for adjustments of your insulin  regimen Patient taking differently: Inject 4 Units into the skin 3 (three) times daily with meals. Also given per sliding scale, if 151-200= 2, 201-250= 4, 251-300= 6, 301-350= 8, 351-400= 8, if BG >400, give 10 units Please follow up with your PCP for adjustments of your insulin  regimen 07/19/22   Etter Hermann., MD  Lancet Device MISC 1 each by Does not apply route 3 (three) times daily. May dispense any manufacturer covered by patient's insurance. 07/19/22   Etter Hermann., MD  metFORMIN  (GLUCOPHAGE ) 500 MG tablet Take 1 tablet (500 mg total) by mouth 2 (two) times daily with a meal. Follow up with your PCP to discuss uptitration of this medicine Patient taking differently: Take 500-1,000 mg by mouth See admin instructions. Give 2 tablet by mouth in the morning and 1 tablet  at bedtime Follow up with your PCP to discuss uptitration of this medicine 07/19/22 06/12/24  Etter Hermann., MD  metoprolol  succinate (TOPROL -XL) 50 MG 24 hr tablet Take 1 tablet (50 mg total) by mouth daily. Take with or immediately following a meal. 07/19/22 06/12/24  Etter Hermann., MD  naloxone  (NARCAN ) nasal spray 4 mg/0.1 mL Place 1 spray into the nose once. SPRAY 1 SPRAY INTO ONE NOSTRIL AS DIRECTED FOR OPIOID OVERDOSE - CALL 911 IMMEDIATELY, ADMINISTER DOSE, THEN TURN PERSON ON SIDE - IF NO RESPONSE IN 2-3 MINUTES OR  PERSON RESPONDS BUT RELAPSES, REPEAT USING A NEW SPRAY DEVICE AND SPRAY INTO THE OTHER NOSTRIL Patient not taking: Reported on 06/13/2023 03/15/23   [provider]  OLANZapine  (ZYPREXA ) 5 MG tablet Take 5 mg by mouth at bedtime. 04/17/23   [provider]  ondansetron  (ZOFRAN ) 4 MG tablet Take 1 tablet (4 mg total) by mouth every 8 (eight) hours as needed for nausea or vomiting. 06/16/23   Aura Leeds Latif, DO  tamsulosin  (FLOMAX ) 0.4 MG CAPS capsule Take 0.4 mg by mouth at bedtime. 04/17/23   [provider]  thiamine  (VITAMIN B-1) 100 MG tablet Take 1 tablet (100 mg total) by mouth daily. 05/20/23   Haydee Lipa, MD  traZODone  (DESYREL ) 50 MG tablet Take 1 tablet (50 mg total) by mouth at bedtime. 05/20/23 07/19/23  Haydee Lipa, MD      Allergies    Lisinopril and Omeprazole    Review of Systems   Review of Systems  Physical Exam Updated Vital Signs BP 123/71   Pulse 71   Temp 98.6 F (37 C) (Oral)   Resp 16   Ht 5\' 6"  (1.676 m)   Wt 61 kg   SpO2 100%   BMI 21.71 kg/m  Physical Exam Constitutional:      General: He is not in acute distress. HENT:     Head: Normocephalic and atraumatic.  Eyes:     Conjunctiva/sclera: Conjunctivae normal.     Pupils: Pupils are equal, round, and reactive to light.  Cardiovascular:     Rate and Rhythm: Normal rate and regular rhythm.  Pulmonary:     Effort: Pulmonary effort is normal. No respiratory distress.  Abdominal:     General: There is no distension.     Tenderness: There is no abdominal tenderness.  Genitourinary:    Comments: Small amount of blood near the external urethral meatus Skin:    General: Skin is warm and dry.  Neurological:     General: No focal deficit present.     Mental Status: He is alert. Mental status is at baseline.  Psychiatric:        Mood and Affect: Mood normal.        Behavior: Behavior normal.     ED Results / Procedures / Treatments   Labs (all labs ordered  are listed, but only abnormal results are displayed) Labs Reviewed - No data to display  EKG None  Radiology No results found.  Procedures Procedures    Medications Ordered in ED Medications  lidocaine  (XYLOCAINE ) 2 % jelly 1 Application (1 Application Topical Given 06/25/23 1424)  fentaNYL  (SUBLIMAZE ) injection 50 mcg (50 mcg Intramuscular Given 06/25/23 1425)    ED Course/ Medical Decision Making/ A&P  Medical Decision Making Risk Prescription drug management.   Patient is here with traumatic Foley removal.  We will attempt to reinsert Foley to help prevent possibility of stricture.  Uro-Jet numbing jelly and IM pain medicine and been ordered.  The patient appears calm and cooperative at this time and seems to understand the need for the Foley catheter placement, concern for clotting otherwise.  RN advised about patient's prior history of violent outbursts against staff - advised additional personnel support for foley placement  Foley catheter exchanged at the bedside without any significant problems and no gross hematuria.  Patient can be discharged back to facility with urology follow-up        Final Clinical Impression(s) / ED Diagnoses Final diagnoses:  Encounter for Foley catheter removal    Rx / DC Orders ED Discharge Orders     None         Ceilidh Torregrossa, Janalyn Me, MD 06/25/23 [phone]

## 2023-06-26 ENCOUNTER — Emergency Department (HOSPITAL_COMMUNITY)
Admission: EM | Admit: 2023-06-26 | Discharge: 2023-06-27 | Disposition: A | Discharge status: Home / Self Care | Attending: Emergency Medicine | Admitting: Emergency Medicine

## 2023-06-26 ENCOUNTER — Other Ambulatory Visit: Payer: Self-pay

## 2023-06-26 ENCOUNTER — Encounter (HOSPITAL_COMMUNITY): Payer: Self-pay | Admitting: Emergency Medicine

## 2023-06-26 DIAGNOSIS — R4182 Altered mental status, unspecified: Secondary | ICD-10-CM | POA: Diagnosis not present

## 2023-06-26 DIAGNOSIS — Z7984 Long term (current) use of oral hypoglycemic drugs: Secondary | ICD-10-CM | POA: Insufficient documentation

## 2023-06-26 DIAGNOSIS — I10 Essential (primary) hypertension: Secondary | ICD-10-CM | POA: Insufficient documentation

## 2023-06-26 DIAGNOSIS — R339 Retention of urine, unspecified: Secondary | ICD-10-CM | POA: Diagnosis present

## 2023-06-26 DIAGNOSIS — Z794 Long term (current) use of insulin: Secondary | ICD-10-CM | POA: Diagnosis not present

## 2023-06-26 DIAGNOSIS — Z7982 Long term (current) use of aspirin: Secondary | ICD-10-CM | POA: Diagnosis not present

## 2023-06-26 DIAGNOSIS — Z79899 Other long term (current) drug therapy: Secondary | ICD-10-CM | POA: Insufficient documentation

## 2023-06-26 DIAGNOSIS — E1165 Type 2 diabetes mellitus with hyperglycemia: Secondary | ICD-10-CM | POA: Diagnosis not present

## 2023-06-26 LAB — COMPREHENSIVE METABOLIC PANEL WITH GFR
ALT: 19 U/L (ref 0–44)
AST: 13 U/L — ABNORMAL LOW (ref 15–41)
Albumin: 3.6 g/dL (ref 3.5–5.0)
Alkaline Phosphatase: 60 U/L (ref 38–126)
Anion gap: 12 (ref 5–15)
BUN: 27 mg/dL — ABNORMAL HIGH (ref 8–23)
CO2: 23 mmol/L (ref 22–32)
Calcium: 8.8 mg/dL — ABNORMAL LOW (ref 8.9–10.3)
Chloride: 99 mmol/L (ref 98–111)
Creatinine, Ser: 1.56 mg/dL — ABNORMAL HIGH (ref 0.61–1.24)
GFR, Estimated: 48 mL/min — ABNORMAL LOW (ref >60)
Glucose, Bld: 421 mg/dL — ABNORMAL HIGH (ref 70–99)
Potassium: 5.3 mmol/L — ABNORMAL HIGH (ref 3.5–5.1)
Sodium: 134 mmol/L — ABNORMAL LOW (ref 135–145)
Total Bilirubin: 0.7 mg/dL (ref 0.0–1.2)
Total Protein: 7.3 g/dL (ref 6.5–8.1)

## 2023-06-26 LAB — AMMONIA: Ammonia: 13 umol/L (ref 9–35)

## 2023-06-26 LAB — URINALYSIS, ROUTINE W REFLEX MICROSCOPIC
Bilirubin Urine: NEGATIVE
Glucose, UA: >=500 mg/dL — AB
Hgb urine dipstick: NEGATIVE
Ketones, ur: NEGATIVE mg/dL
Leukocytes,Ua: NEGATIVE
Nitrite: NEGATIVE
Protein, ur: NEGATIVE mg/dL
Specific Gravity, Urine: 1.026 (ref 1.005–1.030)
pH: 6 (ref 5.0–8.0)

## 2023-06-26 LAB — CBC
HCT: 33.9 % — ABNORMAL LOW (ref 39.0–52.0)
Hemoglobin: 10.6 g/dL — ABNORMAL LOW (ref 13.0–17.0)
MCH: 27 pg (ref 26.0–34.0)
MCHC: 31.3 g/dL (ref 30.0–36.0)
MCV: 86.5 fL (ref 80.0–100.0)
Platelets: 301 10*3/uL (ref 150–400)
RBC: 3.92 MIL/uL — ABNORMAL LOW (ref 4.22–5.81)
RDW: 13.9 % (ref 11.5–15.5)
WBC: 5.7 10*3/uL (ref 4.0–10.5)
nRBC: 0 % (ref 0.0–0.2)

## 2023-06-26 LAB — CBG MONITORING, ED: Glucose-Capillary: 372 mg/dL — ABNORMAL HIGH (ref 70–99)

## 2023-06-26 MED ORDER — LACTATED RINGERS IV BOLUS
1000.0000 mL | Freq: Once | INTRAVENOUS | Status: AC
  Administered 2023-06-26: 1000 mL via INTRAVENOUS

## 2023-06-26 NOTE — Discharge Instructions (Signed)
 All the blood work today was normal including kidney function, white blood cell count and urine without signs of infection.  You will need to keep the Foley catheter in place until you can follow-up with urology.

## 2023-06-26 NOTE — ED Triage Notes (Signed)
 Patient BIB EMS c/o AMS. Per report patient pulled his catheter out again today. Patient was seen yesterday with same complain. Per EMS patient initial CBG was 470. Patient poor historian. Patient A/Ox1.  BP 150/82 HR 64 RR 18 100% on RA

## 2023-06-26 NOTE — ED Provider Notes (Signed)
 [place] EMERGENCY DEPARTMENT AT [facility] Provider Note   CSN: [id number] Arrival date & time: 06/26/23  1525     History  Chief Complaint  Patient presents with   Altered Mental Status    William Acevedo is a 68 y.o. male.  Pt is a 68y/o male with hx of hypertension, hyperlipidemia, diabetes mellitus type 2, history of CVA, history of PTSD, with depression anxiety as well as prior cocaine and alcohol use who has been at a SNF since hospital d/c on 06/13/23 who is presenting today due to lower abdominal pain and inability to urinate.  Patient was brought in by paramedics who report that patient has been altered and pulled out his catheter again today.  Patient did the same thing yesterday and had it replaced.  Patient reports pain in the suprapubic area but otherwise denies chest pain, nausea or vomiting.  He does report that he has not eaten today and states he just was not hungry.  He denies any leg pain or swelling.  It appears that patient had a catheter placed during his last hospitalization due to urinary retention.  While in the room patient started doubling over and stating his bladder hurt and was able to pass a little bit of urine but seems significantly uncomfortable.  Patient is a poor historian and difficult to understand.  EMS also reported a blood sugar in the 400s.  The history is provided by the patient and the EMS personnel. The history is limited by the condition of the patient.  Altered Mental Status      Home Medications Prior to Admission medications   Medication Sig Start Date End Date Taking? Authorizing Provider  amLODipine  (NORVASC ) 5 MG tablet Take 1 tablet (5 mg total) by mouth daily. 05/20/23   Haydee Lipa, MD  aspirin  EC 81 MG tablet Take 1 tablet (81 mg total) by mouth daily. Swallow whole. 05/20/23   Haydee Lipa, MD  atorvastatin  (LIPITOR ) 80 MG tablet Take 1 tablet (80 mg total) by mouth daily. 07/19/22 05/13/24  Etter Hermann., MD  cephALEXin  (KEFLEX ) 500 MG capsule Take 1 capsule (500 mg total) by mouth 4 (four) times daily. 06/22/23   Burnette Carte, MD  clopidogrel  (PLAVIX ) 75 MG tablet Take 1 tablet by mouth daily. 08/29/22   [provider]  dapagliflozin  propanediol (FARXIGA ) 10 MG TABS tablet Take 1 tablet by mouth daily. 08/29/22   [provider]  DULoxetine  (CYMBALTA ) 60 MG capsule Take 1 capsule (60 mg total) by mouth daily. For chronic pain and mood 07/19/22 05/13/24  Etter Hermann., MD  ezetimibe  (ZETIA ) 10 MG tablet Take 1 tablet (10 mg total) by mouth daily. 07/20/22 05/13/24  Etter Hermann., MD  famotidine  (PEPCID ) 20 MG tablet Take 1 tablet (20 mg total) by mouth daily before breakfast. 07/19/22 06/12/24  Etter Hermann., MD  folic acid  (FOLVITE ) 1 MG tablet Take 1 tablet by mouth daily. 08/29/22   [provider]  hydrOXYzine  (ATARAX ) 25 MG tablet Take 25 mg by mouth at bedtime. 04/17/23   [provider]  insulin  glargine (LANTUS ) 100 UNIT/ML Solostar Pen Inject 10 Units into the skin daily. Please follow up with your PCP for adjustments to your insulin  regimen Patient taking differently: Inject 20 Units into the skin at bedtime. Please follow up with your PCP for adjustments to your insulin  regimen 05/20/23   Haydee Lipa, MD  insulin  lispro (HUMALOG ) 100  UNIT/ML injection Inject 0.04 mLs (4 Units total) into the skin 3 (three) times daily with meals. Please follow up with your PCP for adjustments of your insulin  regimen Patient taking differently: Inject 4 Units into the skin 3 (three) times daily with meals. Also given per sliding scale, if 151-200= 2, 201-250= 4, 251-300= 6, 301-350= 8, 351-400= 8, if BG >400, give 10 units Please follow up with your PCP for adjustments of your insulin  regimen 07/19/22   Etter Hermann., MD  Lancet Device MISC 1 each by Does not apply route 3 (three) times daily. May dispense any manufacturer covered  by patient's insurance. 07/19/22   Etter Hermann., MD  metFORMIN  (GLUCOPHAGE ) 500 MG tablet Take 1 tablet (500 mg total) by mouth 2 (two) times daily with a meal. Follow up with your PCP to discuss uptitration of this medicine Patient taking differently: Take 500-1,000 mg by mouth See admin instructions. Give 2 tablet by mouth in the morning and 1 tablet at bedtime Follow up with your PCP to discuss uptitration of this medicine 07/19/22 06/12/24  Etter Hermann., MD  metoprolol  succinate (TOPROL -XL) 50 MG 24 hr tablet Take 1 tablet (50 mg total) by mouth daily. Take with or immediately following a meal. 07/19/22 06/12/24  Etter Hermann., MD  naloxone  (NARCAN ) nasal spray 4 mg/0.1 mL Place 1 spray into the nose once. SPRAY 1 SPRAY INTO ONE NOSTRIL AS DIRECTED FOR OPIOID OVERDOSE - CALL 911 IMMEDIATELY, ADMINISTER DOSE, THEN TURN PERSON ON SIDE - IF NO RESPONSE IN 2-3 MINUTES OR PERSON RESPONDS BUT RELAPSES, REPEAT USING A NEW SPRAY DEVICE AND SPRAY INTO THE OTHER NOSTRIL Patient not taking: Reported on 06/13/2023 03/15/23   [provider]  OLANZapine  (ZYPREXA ) 5 MG tablet Take 5 mg by mouth at bedtime. 04/17/23   [provider]  ondansetron  (ZOFRAN ) 4 MG tablet Take 1 tablet (4 mg total) by mouth every 8 (eight) hours as needed for nausea or vomiting. 06/16/23   Aura Leeds Latif, DO  tamsulosin  (FLOMAX ) 0.4 MG CAPS capsule Take 0.4 mg by mouth at bedtime. 04/17/23   [provider]  thiamine  (VITAMIN B-1) 100 MG tablet Take 1 tablet (100 mg total) by mouth daily. 05/20/23   Haydee Lipa, MD  traZODone  (DESYREL ) 50 MG tablet Take 1 tablet (50 mg total) by mouth at bedtime. 05/20/23 07/19/23  Haydee Lipa, MD      Allergies    Lisinopril and Omeprazole    Review of Systems   Review of Systems  Physical Exam Updated Vital Signs BP (!) 160/75 (BP Location: Right Arm)   Pulse 69   Temp 98.2 F (36.8 C) (Oral)   Resp 12   SpO2 99%  Physical  Exam Vitals and nursing note reviewed.  Constitutional:      General: He is not in acute distress.    Appearance: He is well-developed.  HENT:     Head: Normocephalic and atraumatic.     Mouth/Throat:     Mouth: Mucous membranes are dry.  Eyes:     Conjunctiva/sclera: Conjunctivae normal.     Pupils: Pupils are equal, round, and reactive to light.  Cardiovascular:     Rate and Rhythm: Normal rate and regular rhythm.     Heart sounds: No murmur heard. Pulmonary:     Effort: Pulmonary effort is normal. No respiratory distress.     Breath sounds: Normal breath sounds. No wheezing or rales.  Abdominal:  General: There is distension.     Palpations: Abdomen is soft.     Tenderness: There is abdominal tenderness. There is no guarding or rebound.     Comments: Mass noted to the umbilicus tender with palpation.  Small amount of urine in the diaper which is yellow and nonbloody  Musculoskeletal:        General: No tenderness. Normal range of motion.     Cervical back: Normal range of motion and neck supple.  Skin:    General: Skin is warm and dry.     Coloration: Skin is pale.     Findings: No erythema or rash.  Neurological:     Mental Status: He is alert.     Comments: Oriented to person.  Speech is very difficult to understand unclear if it is slurred or he is just hard to understand at baseline.  Able to move all extremities without difficulty.  No notable facial droops.  Psychiatric:     Comments: Cooperative at this time     ED Results / Procedures / Treatments   Labs (all labs ordered are listed, but only abnormal results are displayed) Labs Reviewed  COMPREHENSIVE METABOLIC PANEL WITH GFR - Abnormal; Notable for the following components:      Result Value   Sodium 134 (*)    Potassium 5.3 (*)    Glucose, Bld 421 (*)    BUN 27 (*)    Creatinine, Ser 1.56 (*)    Calcium  8.8 (*)    AST 13 (*)    GFR, Estimated 48 (*)    All other components within normal limits   CBC - Abnormal; Notable for the following components:   RBC 3.92 (*)    Hemoglobin 10.6 (*)    HCT 33.9 (*)    All other components within normal limits  URINALYSIS, ROUTINE W REFLEX MICROSCOPIC - Abnormal; Notable for the following components:   Color, Urine STRAW (*)    Glucose, UA >=500 (*)    Bacteria, UA RARE (*)    All other components within normal limits  CBG MONITORING, ED - Abnormal; Notable for the following components:   Glucose-Capillary 372 (*)    All other components within normal limits  AMMONIA     EKG None  Radiology No results found.  Procedures Procedures    Medications Ordered in ED Medications  lactated ringers  bolus 1,000 mL (0 mLs Intravenous Stopped 06/26/23 1824)    ED Course/ Medical Decision Making/ A&P                                 Medical Decision Making Amount and/or Complexity of Data Reviewed Labs: ordered.   Pt with multiple medical problems and comorbidities and presenting today with a complaint that caries a high risk for morbidity and mortality.  Here today due to pulling out his Foley catheter again which is the third time since discharge on 27 May.  Patient was having urinary retention and continues to have issues urinating.  Upon arrival here today he has bladder distention and pain.  He is able to pass a little bit of urine however it appears to be overflow.  He did receive 3 doses of Rocephin  while hospitalized but UA was negative for infection.  Also facility reports the patient was altered.  He seems to be able to understand questions and answer yes and no appropriately but is difficult to understand.  He  does have prior history of CVA.  Will ensure no new electrolyte abnormalities, encephalopathy related to ammonia  or signs of AKI.  New catheter was placed with significant drainage of urine.  Also blood sugar was in the 400s and patient was given IV fluids. After new catheter was placed patient is now much more comfortable.  Has  had over 300 mL drain from his bladder.  I independently interpreted patient's labs and UA is normal without evidence of infection, ammonia  within normal limits, CMP with a creatinine of 1.56 which is unchanged from prior, hyperglycemia with a blood sugar of 421 and potassium of 5.3 with normal anion gap, CBC with stable hemoglobin of 10 and normal white blood cell count.  Repeat sugar after fluid is 372.  Patient requesting food and drink.  At this time he appears stable for discharge back to facility.  No evidence to consist of new stroke.         Final Clinical Impression(s) / ED Diagnoses Final diagnoses:  Acute on chronic urinary retention    Rx / DC Orders ED Discharge Orders     None         Almond Army, MD 06/26/23 1829

## 2023-07-12 ENCOUNTER — Other Ambulatory Visit: Payer: Self-pay

## 2023-07-12 ENCOUNTER — Emergency Department (HOSPITAL_COMMUNITY)
Admission: EM | Admit: 2023-07-12 | Discharge: 2023-07-12 | Disposition: A | Discharge status: Home / Self Care | Attending: Emergency Medicine | Admitting: Emergency Medicine

## 2023-07-12 DIAGNOSIS — Z7984 Long term (current) use of oral hypoglycemic drugs: Secondary | ICD-10-CM | POA: Diagnosis not present

## 2023-07-12 DIAGNOSIS — T83098A Other mechanical complication of other indwelling urethral catheter, initial encounter: Secondary | ICD-10-CM | POA: Diagnosis present

## 2023-07-12 DIAGNOSIS — E119 Type 2 diabetes mellitus without complications: Secondary | ICD-10-CM | POA: Diagnosis not present

## 2023-07-12 DIAGNOSIS — T83021A Displacement of indwelling urethral catheter, initial encounter: Secondary | ICD-10-CM

## 2023-07-12 DIAGNOSIS — Z8673 Personal history of transient ischemic attack (TIA), and cerebral infarction without residual deficits: Secondary | ICD-10-CM | POA: Insufficient documentation

## 2023-07-12 DIAGNOSIS — I10 Essential (primary) hypertension: Secondary | ICD-10-CM | POA: Diagnosis not present

## 2023-07-12 DIAGNOSIS — Z794 Long term (current) use of insulin: Secondary | ICD-10-CM | POA: Diagnosis not present

## 2023-07-12 DIAGNOSIS — Z7982 Long term (current) use of aspirin: Secondary | ICD-10-CM | POA: Diagnosis not present

## 2023-07-12 DIAGNOSIS — Z7902 Long term (current) use of antithrombotics/antiplatelets: Secondary | ICD-10-CM | POA: Diagnosis not present

## 2023-07-12 DIAGNOSIS — Z79899 Other long term (current) drug therapy: Secondary | ICD-10-CM | POA: Insufficient documentation

## 2023-07-12 DIAGNOSIS — Y732 Prosthetic and other implants, materials and accessory gastroenterology and urology devices associated with adverse incidents: Secondary | ICD-10-CM | POA: Diagnosis not present

## 2023-07-12 LAB — CBC WITH DIFFERENTIAL/PLATELET
Abs Immature Granulocytes: 0.02 10*3/uL (ref 0.00–0.07)
Basophils Absolute: 0 10*3/uL (ref 0.0–0.1)
Basophils Relative: 0 %
Eosinophils Absolute: 0.1 10*3/uL (ref 0.0–0.5)
Eosinophils Relative: 1 %
HCT: 35.3 % — ABNORMAL LOW (ref 39.0–52.0)
Hemoglobin: 11.2 g/dL — ABNORMAL LOW (ref 13.0–17.0)
Immature Granulocytes: 0 %
Lymphocytes Relative: 29 %
Lymphs Abs: 1.9 10*3/uL (ref 0.7–4.0)
MCH: 27.5 pg (ref 26.0–34.0)
MCHC: 31.7 g/dL (ref 30.0–36.0)
MCV: 86.5 fL (ref 80.0–100.0)
Monocytes Absolute: 0.6 10*3/uL (ref 0.1–1.0)
Monocytes Relative: 10 %
Neutro Abs: 3.8 10*3/uL (ref 1.7–7.7)
Neutrophils Relative %: 60 %
Platelets: 242 10*3/uL (ref 150–400)
RBC: 4.08 MIL/uL — ABNORMAL LOW (ref 4.22–5.81)
RDW: 13.7 % (ref 11.5–15.5)
WBC: 6.5 10*3/uL (ref 4.0–10.5)
nRBC: 0 % (ref 0.0–0.2)

## 2023-07-12 LAB — BASIC METABOLIC PANEL WITH GFR
Anion gap: 12 (ref 5–15)
BUN: 27 mg/dL — ABNORMAL HIGH (ref 8–23)
CO2: 22 mmol/L (ref 22–32)
Calcium: 9.1 mg/dL (ref 8.9–10.3)
Chloride: 105 mmol/L (ref 98–111)
Creatinine, Ser: 1.05 mg/dL (ref 0.61–1.24)
GFR, Estimated: >60 mL/min (ref >60)
Glucose, Bld: 197 mg/dL — ABNORMAL HIGH (ref 70–99)
Potassium: 4.5 mmol/L (ref 3.5–5.1)
Sodium: 139 mmol/L (ref 135–145)

## 2023-07-12 MED ORDER — MIDAZOLAM HCL 2 MG/2ML IJ SOLN
2.0000 mg | Freq: Once | INTRAMUSCULAR | Status: AC
  Administered 2023-07-12: 2 mg via INTRAVENOUS
  Filled 2023-07-12: qty 2

## 2023-07-12 MED ORDER — LIDOCAINE HCL URETHRAL/MUCOSAL 2 % EX GEL
1.0000 | Freq: Once | CUTANEOUS | Status: AC
  Administered 2023-07-12: 1 via TOPICAL
  Filled 2023-07-12: qty 11

## 2023-07-12 NOTE — ED Triage Notes (Addendum)
 Pt BIB EMS from home. EMS reports pts foley catheter was pulled out again. Per family, pt has cognitive disorder and is currently at baseline. Pt is A&O to self and answers hospital. Bleeding from penis and on sheets.   EMS VS 138/82, HR 86, 98% RA, cbg 136, RR 16

## 2023-07-12 NOTE — ED Notes (Signed)
 When asked what happened to foley cath, pt states I pulled it out and states it gets in the way, pt does not answer when asked other questions r/t foley. Pt request soda at this time.

## 2023-07-12 NOTE — ED Notes (Signed)
 Pt refuses new foley cath insertion, pt does not explain why, but states no.

## 2023-07-12 NOTE — ED Notes (Signed)
 Scant bright red blood present at meatus and dried blood on genitals and thighs cleaned. Bladder scan- 560. 4F Caudae catheter inserted with urine return. Scant bright blood from meatus after catheter insertion.

## 2023-07-12 NOTE — ED Provider Notes (Signed)
 [place] EMERGENCY DEPARTMENT AT [facility] Provider Note   CSN: [id number] Arrival date & time: 07/12/23  9584     Patient presents with: No chief complaint on file.   William Acevedo is a 68 y.o. male.   The history is provided by the EMS personnel and the patient.  William Acevedo is a 68 y.o. male who presents to the Emergency Department complaining of Foley catheter removal.  He presents to the emergency department by EMS for evaluation after accidental self removal of Foley catheter.  He has chronic Foley catheter for urinary retention.  There was blood on his sheets, in the house.  He denies any pain.  He does have a history of diabetes, CVA, hypertension, polysubstance abuse.   Additional history available from the patient's brother over the phone.  Brother states that William Acevedo is completely dependent on caregivers for ADLs.  He has had a catheter in place for over a month.  Brother took him out of [facility] last week due to poor care that he was receiving at [facility].  The patient self DC'd his Foley catheter overnight.  Brother request that this gets replaced.  Brother does have healthcare power of attorney and his legal guardian.  Brother states that patient is not using any drugs or alcohol.  Brother states that patient is at his baseline.      Prior to Admission medications   Medication Sig Start Date End Date Taking? Authorizing Provider  amLODipine  (NORVASC ) 5 MG tablet Take 1 tablet (5 mg total) by mouth daily. 05/20/23   Lue Elsie JAYSON, MD  aspirin  EC 81 MG tablet Take 1 tablet (81 mg total) by mouth daily. Swallow whole. 05/20/23   Lue Elsie JAYSON, MD  atorvastatin  (LIPITOR ) 80 MG tablet Take 1 tablet (80 mg total) by mouth daily. 07/19/22 05/13/24  Perri DELENA Meliton Mickey., MD  cephALEXin  (KEFLEX ) 500 MG capsule Take 1 capsule (500 mg total) by mouth 4 (four) times daily. 06/22/23   Laurice Maude JAYSON, MD  clopidogrel  (PLAVIX ) 75 MG tablet Take  1 tablet by mouth daily. 08/29/22   [provider]  dapagliflozin  propanediol (FARXIGA ) 10 MG TABS tablet Take 1 tablet by mouth daily. 08/29/22   [provider]  DULoxetine  (CYMBALTA ) 60 MG capsule Take 1 capsule (60 mg total) by mouth daily. For chronic pain and mood 07/19/22 05/13/24  Perri DELENA Meliton Mickey., MD  ezetimibe  (ZETIA ) 10 MG tablet Take 1 tablet (10 mg total) by mouth daily. 07/20/22 05/13/24  Perri DELENA Meliton Mickey., MD  famotidine  (PEPCID ) 20 MG tablet Take 1 tablet (20 mg total) by mouth daily before breakfast. 07/19/22 06/12/24  Perri DELENA Meliton Mickey., MD  folic acid  (FOLVITE ) 1 MG tablet Take 1 tablet by mouth daily. 08/29/22   [provider]  hydrOXYzine  (ATARAX ) 25 MG tablet Take 25 mg by mouth at bedtime. 04/17/23   [provider]  insulin  glargine (LANTUS ) 100 UNIT/ML Solostar Pen Inject 10 Units into the skin daily. Please follow up with your PCP for adjustments to your insulin  regimen Patient taking differently: Inject 20 Units into the skin at bedtime. Please follow up with your PCP for adjustments to your insulin  regimen 05/20/23   Lue Elsie JAYSON, MD  insulin  lispro (HUMALOG ) 100 UNIT/ML injection Inject 0.04 mLs (4 Units total) into the skin 3 (three) times daily with meals. Please follow up with your PCP for adjustments of your insulin  regimen Patient taking differently: Inject 4 Units into the  skin 3 (three) times daily with meals. Also given per sliding scale, if 151-200= 2, 201-250= 4, 251-300= 6, 301-350= 8, 351-400= 8, if BG >400, give 10 units Please follow up with your PCP for adjustments of your insulin  regimen 07/19/22   Perri DELENA Meliton Mickey., MD  Lancet Device MISC 1 each by Does not apply route 3 (three) times daily. May dispense any manufacturer covered by patient's insurance. 07/19/22   Perri DELENA Meliton Mickey., MD  metFORMIN  (GLUCOPHAGE ) 500 MG tablet Take 1 tablet (500 mg total) by mouth 2 (two) times daily with a meal. Follow up with  your PCP to discuss uptitration of this medicine Patient taking differently: Take 500-1,000 mg by mouth See admin instructions. Give 2 tablet by mouth in the morning and 1 tablet at bedtime Follow up with your PCP to discuss uptitration of this medicine 07/19/22 06/12/24  Perri DELENA Meliton Mickey., MD  metoprolol  succinate (TOPROL -XL) 50 MG 24 hr tablet Take 1 tablet (50 mg total) by mouth daily. Take with or immediately following a meal. 07/19/22 06/12/24  Perri DELENA Meliton Mickey., MD  naloxone  (NARCAN ) nasal spray 4 mg/0.1 mL Place 1 spray into the nose once. SPRAY 1 SPRAY INTO ONE NOSTRIL AS DIRECTED FOR OPIOID OVERDOSE - CALL 911 IMMEDIATELY, ADMINISTER DOSE, THEN TURN PERSON ON SIDE - IF NO RESPONSE IN 2-3 MINUTES OR PERSON RESPONDS BUT RELAPSES, REPEAT USING A NEW SPRAY DEVICE AND SPRAY INTO THE OTHER NOSTRIL Patient not taking: Reported on 06/13/2023 03/15/23   [provider]  OLANZapine  (ZYPREXA ) 5 MG tablet Take 5 mg by mouth at bedtime. 04/17/23   [provider]  ondansetron  (ZOFRAN ) 4 MG tablet Take 1 tablet (4 mg total) by mouth every 8 (eight) hours as needed for nausea or vomiting. 06/16/23   Sherrill Cable Latif, DO  tamsulosin  (FLOMAX ) 0.4 MG CAPS capsule Take 0.4 mg by mouth at bedtime. 04/17/23   [provider]  thiamine  (VITAMIN B-1) 100 MG tablet Take 1 tablet (100 mg total) by mouth daily. 05/20/23   Lue Elsie BROCKS, MD  traZODone  (DESYREL ) 50 MG tablet Take 1 tablet (50 mg total) by mouth at bedtime. 05/20/23 07/19/23  Lue Elsie BROCKS, MD    Allergies: Lisinopril and Omeprazole    Review of Systems  All other systems reviewed and are negative.   Updated Vital Signs BP (!) 143/72   Pulse 65   Temp 98.5 F (36.9 C) (Oral)   Resp 12   Ht 5' 6 (1.676 m)   Wt 61 kg   SpO2 100%   BMI 21.71 kg/m   Physical Exam Vitals and nursing note reviewed.  Constitutional:      Appearance: He is well-developed.  HENT:     Head: Normocephalic and atraumatic.    Cardiovascular:     Rate and Rhythm: Normal rate and regular rhythm.  Pulmonary:     Effort: Pulmonary effort is normal. No respiratory distress.  Abdominal:     Palpations: Abdomen is soft.     Tenderness: There is no abdominal tenderness. There is no guarding or rebound.  Genitourinary:    Comments: Blood at the urethral meatus  Musculoskeletal:        General: No tenderness.   Skin:    General: Skin is warm and dry.   Neurological:     Mental Status: He is alert.     Comments: Disoriented to time and recent events.    (all labs ordered are listed, but only abnormal results  are displayed) Labs Reviewed  BASIC METABOLIC PANEL WITH GFR - Abnormal; Notable for the following components:      Result Value   Glucose, Bld 197 (*)    BUN 27 (*)    All other components within normal limits  CBC WITH DIFFERENTIAL/PLATELET - Abnormal; Notable for the following components:   RBC 4.08 (*)    Hemoglobin 11.2 (*)    HCT 35.3 (*)    All other components within normal limits    EKG: None  Radiology: No results found.   Procedures   Medications Ordered in the ED  lidocaine  (XYLOCAINE ) 2 % jelly 1 Application (1 Application Topical Given 07/12/23 0600)  midazolam (VERSED) injection 2 mg (2 mg Intravenous Given 07/12/23 0600)                                    Medical Decision Making Amount and/or Complexity of Data Reviewed Labs: ordered.  Risk Prescription drug management.   Patient with history of prior CVA, Foley catheter in place, diabetes here for evaluation of self DC'd Foley catheter.  There is blood at the urethral meatus.  Patient does not want the catheter to be replaced but he is lacking capacity at this time.  Consent was obtained over the phone for catheter replacement by brother.  Uro-Jet was applied and patient was given Versed for anxiolysis.  Nursing was able to introduce a 16 [place] coud catheter without difficulty.  Urine was initially blood-tinged but  quickly cleared.  Labs are at the patient's baseline.  Patient is resting comfortably after replacement.  Plan to discharge back home with outpatient follow-up.     Final diagnoses:  Displacement of Foley catheter, initial encounter [facility])    ED Discharge Orders     None          Griselda Norris, MD 07/12/23 [phone]

## 2023-09-04 ENCOUNTER — Emergency Department (HOSPITAL_COMMUNITY)
Admission: EM | Admit: 2023-09-04 | Discharge: 2023-09-04 | Disposition: A | Discharge status: Home / Self Care | Attending: Emergency Medicine | Admitting: Emergency Medicine

## 2023-09-04 ENCOUNTER — Other Ambulatory Visit: Payer: Self-pay

## 2023-09-04 ENCOUNTER — Encounter (HOSPITAL_COMMUNITY): Payer: Self-pay

## 2023-09-04 DIAGNOSIS — T83098A Other mechanical complication of other indwelling urethral catheter, initial encounter: Secondary | ICD-10-CM | POA: Insufficient documentation

## 2023-09-04 DIAGNOSIS — Z7982 Long term (current) use of aspirin: Secondary | ICD-10-CM | POA: Insufficient documentation

## 2023-09-04 DIAGNOSIS — Y828 Other medical devices associated with adverse incidents: Secondary | ICD-10-CM | POA: Insufficient documentation

## 2023-09-04 DIAGNOSIS — T839XXA Unspecified complication of genitourinary prosthetic device, implant and graft, initial encounter: Secondary | ICD-10-CM

## 2023-09-04 DIAGNOSIS — Z7902 Long term (current) use of antithrombotics/antiplatelets: Secondary | ICD-10-CM | POA: Insufficient documentation

## 2023-09-04 LAB — CBC WITH DIFFERENTIAL/PLATELET
Abs Immature Granulocytes: 0.02 K/uL (ref 0.00–0.07)
Basophils Absolute: 0 K/uL (ref 0.0–0.1)
Basophils Relative: 0 %
Eosinophils Absolute: 0.1 K/uL (ref 0.0–0.5)
Eosinophils Relative: 1 %
HCT: 39 % (ref 39.0–52.0)
Hemoglobin: 11.9 g/dL — ABNORMAL LOW (ref 13.0–17.0)
Immature Granulocytes: 0 %
Lymphocytes Relative: 24 %
Lymphs Abs: 2.2 K/uL (ref 0.7–4.0)
MCH: 26.7 pg (ref 26.0–34.0)
MCHC: 30.5 g/dL (ref 30.0–36.0)
MCV: 87.6 fL (ref 80.0–100.0)
Monocytes Absolute: 0.8 K/uL (ref 0.1–1.0)
Monocytes Relative: 9 %
Neutro Abs: 5.9 K/uL (ref 1.7–7.7)
Neutrophils Relative %: 66 %
Platelets: 229 K/uL (ref 150–400)
RBC: 4.45 MIL/uL (ref 4.22–5.81)
RDW: 13.4 % (ref 11.5–15.5)
WBC: 9.1 K/uL (ref 4.0–10.5)
nRBC: 0 % (ref 0.0–0.2)

## 2023-09-04 LAB — URINALYSIS, W/ REFLEX TO CULTURE (INFECTION SUSPECTED)
Bilirubin Urine: NEGATIVE
Glucose, UA: >500 mg/dL — AB
Ketones, ur: 15 mg/dL — AB
Nitrite: POSITIVE — AB
Protein, ur: NEGATIVE mg/dL
RBC / HPF: >50 RBC/hpf (ref 0–5)
Specific Gravity, Urine: 1.01 (ref 1.005–1.030)
WBC, UA: >50 WBC/hpf (ref 0–5)
pH: 6 (ref 5.0–8.0)

## 2023-09-04 LAB — BASIC METABOLIC PANEL WITH GFR
Anion gap: 14 (ref 5–15)
BUN: 27 mg/dL — ABNORMAL HIGH (ref 8–23)
CO2: 20 mmol/L — ABNORMAL LOW (ref 22–32)
Calcium: 9.3 mg/dL (ref 8.9–10.3)
Chloride: 107 mmol/L (ref 98–111)
Creatinine, Ser: 1.02 mg/dL (ref 0.61–1.24)
GFR, Estimated: >60 mL/min (ref >60)
Glucose, Bld: 154 mg/dL — ABNORMAL HIGH (ref 70–99)
Potassium: 4.1 mmol/L (ref 3.5–5.1)
Sodium: 141 mmol/L (ref 135–145)

## 2023-09-04 NOTE — ED Notes (Signed)
 Pt caretaker with pt and states he notified the pts son who is the Legal Guardian.  I tried to reach him by phone and did not get an answer.  Pt and caretaker state that caretaker to take pt home.

## 2023-09-04 NOTE — Discharge Instructions (Signed)
 Return for any problem.  ?

## 2023-09-04 NOTE — ED Provider Notes (Signed)
 [place] EMERGENCY DEPARTMENT AT [facility] Provider Note   CSN: [id number] Arrival date & time: 09/04/23  1215     Patient presents with: No chief complaint on file.   William Acevedo is a 68 y.o. male.   68 year old male with prior medical history as detailed below presents for evaluation.  Patient complains of pain at the site of his catheter.  Patient with long standing history of indwelling catheter.  Patient's care giver reports that the patient may have pulled on the catheter overnight.  Catheter still draining urine correctly.  No fevers reported.  Patient has a history of pulling out his foley catheter.  The history is provided by the patient and medical records.       Prior to Admission medications   Medication Sig Start Date End Date Taking? Authorizing Provider  amLODipine  (NORVASC ) 5 MG tablet Take 1 tablet (5 mg total) by mouth daily. 05/20/23   Lue Elsie JAYSON, MD  aspirin  EC 81 MG tablet Take 1 tablet (81 mg total) by mouth daily. Swallow whole. 05/20/23   Lue Elsie JAYSON, MD  atorvastatin  (LIPITOR ) 80 MG tablet Take 1 tablet (80 mg total) by mouth daily. 07/19/22 05/13/24  Perri DELENA Meliton Mickey., MD  cephALEXin  (KEFLEX ) 500 MG capsule Take 1 capsule (500 mg total) by mouth 4 (four) times daily. 06/22/23   Laurice Maude JAYSON, MD  clopidogrel  (PLAVIX ) 75 MG tablet Take 1 tablet by mouth daily. 08/29/22   [provider]  dapagliflozin  propanediol (FARXIGA ) 10 MG TABS tablet Take 1 tablet by mouth daily. 08/29/22   [provider]  DULoxetine  (CYMBALTA ) 60 MG capsule Take 1 capsule (60 mg total) by mouth daily. For chronic pain and mood 07/19/22 05/13/24  Perri DELENA Meliton Mickey., MD  ezetimibe  (ZETIA ) 10 MG tablet Take 1 tablet (10 mg total) by mouth daily. 07/20/22 05/13/24  Perri DELENA Meliton Mickey., MD  famotidine  (PEPCID ) 20 MG tablet Take 1 tablet (20 mg total) by mouth daily before breakfast. 07/19/22 06/12/24  Perri DELENA Meliton Mickey., MD  folic  acid (FOLVITE ) 1 MG tablet Take 1 tablet by mouth daily. 08/29/22   [provider]  hydrOXYzine  (ATARAX ) 25 MG tablet Take 25 mg by mouth at bedtime. 04/17/23   [provider]  insulin  glargine (LANTUS ) 100 UNIT/ML Solostar Pen Inject 10 Units into the skin daily. Please follow up with your PCP for adjustments to your insulin  regimen Patient taking differently: Inject 20 Units into the skin at bedtime. Please follow up with your PCP for adjustments to your insulin  regimen 05/20/23   Lue Elsie JAYSON, MD  insulin  lispro (HUMALOG ) 100 UNIT/ML injection Inject 0.04 mLs (4 Units total) into the skin 3 (three) times daily with meals. Please follow up with your PCP for adjustments of your insulin  regimen Patient taking differently: Inject 4 Units into the skin 3 (three) times daily with meals. Also given per sliding scale, if 151-200= 2, 201-250= 4, 251-300= 6, 301-350= 8, 351-400= 8, if BG >400, give 10 units Please follow up with your PCP for adjustments of your insulin  regimen 07/19/22   Perri DELENA Meliton Mickey., MD  Lancet Device MISC 1 each by Does not apply route 3 (three) times daily. May dispense any manufacturer covered by patient's insurance. 07/19/22   Perri DELENA Meliton Mickey., MD  metFORMIN  (GLUCOPHAGE ) 500 MG tablet Take 1 tablet (500 mg total) by mouth 2 (two) times daily with a meal. Follow up with your PCP to discuss uptitration  of this medicine Patient taking differently: Take 500-1,000 mg by mouth See admin instructions. Give 2 tablet by mouth in the morning and 1 tablet at bedtime Follow up with your PCP to discuss uptitration of this medicine 07/19/22 06/12/24  Perri DELENA Meliton Mickey., MD  metoprolol  succinate (TOPROL -XL) 50 MG 24 hr tablet Take 1 tablet (50 mg total) by mouth daily. Take with or immediately following a meal. 07/19/22 06/12/24  Perri DELENA Meliton Mickey., MD  naloxone  (NARCAN ) nasal spray 4 mg/0.1 mL Place 1 spray into the nose once. SPRAY 1 SPRAY INTO ONE NOSTRIL AS  DIRECTED FOR OPIOID OVERDOSE - CALL 911 IMMEDIATELY, ADMINISTER DOSE, THEN TURN PERSON ON SIDE - IF NO RESPONSE IN 2-3 MINUTES OR PERSON RESPONDS BUT RELAPSES, REPEAT USING A NEW SPRAY DEVICE AND SPRAY INTO THE OTHER NOSTRIL Patient not taking: Reported on 06/13/2023 03/15/23   [provider]  OLANZapine  (ZYPREXA ) 5 MG tablet Take 5 mg by mouth at bedtime. 04/17/23   [provider]  ondansetron  (ZOFRAN ) 4 MG tablet Take 1 tablet (4 mg total) by mouth every 8 (eight) hours as needed for nausea or vomiting. 06/16/23   Sherrill Cable Latif, DO  tamsulosin  (FLOMAX ) 0.4 MG CAPS capsule Take 0.4 mg by mouth at bedtime. 04/17/23   [provider]  thiamine  (VITAMIN B-1) 100 MG tablet Take 1 tablet (100 mg total) by mouth daily. 05/20/23   Lue Elsie BROCKS, MD  traZODone  (DESYREL ) 50 MG tablet Take 1 tablet (50 mg total) by mouth at bedtime. 05/20/23 07/19/23  Lue Elsie BROCKS, MD    Allergies: Lisinopril and Omeprazole    Review of Systems  All other systems reviewed and are negative.   Updated Vital Signs There were no vitals taken for this visit.  Physical Exam Vitals and nursing note reviewed.  Constitutional:      General: He is not in acute distress.    Appearance: Normal appearance. He is well-developed.     Comments: Alert, pleasantly demented  HENT:     Head: Normocephalic and atraumatic.  Eyes:     Conjunctiva/sclera: Conjunctivae normal.     Pupils: Pupils are equal, round, and reactive to light.  Cardiovascular:     Rate and Rhythm: Normal rate and regular rhythm.     Heart sounds: Normal heart sounds.  Pulmonary:     Effort: Pulmonary effort is normal. No respiratory distress.     Breath sounds: Normal breath sounds.  Abdominal:     General: There is no distension.     Palpations: Abdomen is soft.     Tenderness: There is no abdominal tenderness.  Genitourinary:    Comments: Foley cath in place.  Minimal output noted from Foley catheter.   Bladder scan demonstrates 400 mL of urine.  Ultrasound at bedside does not demonstrate distal catheter tip in the bladder.  With palpation of the penile shaft the balloon of the catheter is felt.  With attempted aspiration of the balloon it is not deflating.  With gentle traction of the Foley catheter with the still intact balloon was gently delivered from the tip of the meatus.  It transited approximately 1 to 2 inches.  Apparently the patient pulled the Foley forward until the balloon was in his mid urethra.  It is unclear why the balloon would not deflate either with aspiration or with cutting of the catheter itself.  New Foley placed successfully by nursing staff. Musculoskeletal:        General: No deformity. Normal range  of motion.     Cervical back: Normal range of motion and neck supple.  Skin:    General: Skin is warm and dry.  Neurological:     General: No focal deficit present.     Mental Status: He is alert and oriented to person, place, and time.     (all labs ordered are listed, but only abnormal results are displayed) Labs Reviewed  BASIC METABOLIC PANEL WITH GFR  CBC WITH DIFFERENTIAL/PLATELET  URINALYSIS, W/ REFLEX TO CULTURE (INFECTION SUSPECTED)    EKG: None  Radiology: No results found.   Procedures   Medications Ordered in the ED - No data to display                                  Medical Decision Making Patient presented with pain secondary to partial dislodgment of his Foley catheter.  Patient apparently pulled his catheter part way out.  The Foley balloon was not deflated.  The balloon was actually in mid urethra.  Patient is significantly more comfortable after removal of dislodged Foley and replacement of same.  Patient is nontoxic in appearance.  Screening labs obtained are without significant acute abnormality notified.  Patient is appropriate for discharge.  Importance of close follow-up is stressed.  Amount and/or Complexity of Data  Reviewed Labs: ordered.        Final diagnoses:  Problem with Foley catheter, initial encounter [facility])    ED Discharge Orders     None          Laurice Maude BROCKS, MD 09/04/23 1430

## 2023-09-04 NOTE — ED Triage Notes (Signed)
 Pt came in from home via EMS for urinary catheter stated last night. Baseline dementia s/c to stroke. BP 152/82, O2 95% cbg 204 HR 76.

## 2023-09-06 LAB — URINE CULTURE: Culture: >=100000 — AB

## 2023-09-07 ENCOUNTER — Telehealth (HOSPITAL_BASED_OUTPATIENT_CLINIC_OR_DEPARTMENT_OTHER): Payer: Self-pay | Admitting: *Deleted

## 2023-09-07 NOTE — Telephone Encounter (Signed)
 Post ED Visit - Positive Culture Follow-up  Culture report reviewed by antimicrobial stewardship pharmacist: [facility] []  [name], Pharm.D. []  Venetia Gully, Pharm.D., BCPS AQ-ID []  Garrel Crews, Pharm.D., BCPS []  Almarie Lunger, Pharm.D., BCPS []  [place], [address].D., BCPS, AAHIVP []  Rosaline Bihari, Pharm.D., BCPS, AAHIVP []  Vernell Meier, PharmD, BCPS []  Latanya Hint, PharmD, BCPS []  Donald Medley, PharmD, BCPS []  Rocky Bold, PharmD []  Dorothyann Alert, PharmD, BCPS []  Morene Babe, PharmD  [facility] []  Rosaline Edison, PharmD []  Romona Bliss, PharmD []  Dolphus Roller, PharmD []  [name], Rph []  Vernell Daunt) Leonce, PharmD []  Eva Allis, PharmD []  Rosaline Millet, PharmD []  Iantha Batch, PharmD []  Arvin Gauss, PharmD []  Wanda Hasting, PharmD []  Ronal Rav, PharmD []  Rocky Slade, PharmD []  Bard Jeans, PharmD   Positive urine culture reviewed by Susette Lash PA-C No further patient follow-up is required at this time.  William Acevedo 09/07/2023, 10:44 AM

## 2023-09-07 NOTE — Progress Notes (Signed)
 ED Antimicrobial Stewardship Positive Culture Follow Up   William Acevedo is an 68 y.o. male who presented to [facility] on 09/04/2023 with a chief complaint of No chief complaint on file.   Recent Results (from the past 720 hours)  Urine Culture     Status: Abnormal   Collection Time: 09/04/23  1:33 PM   Specimen: Urine, Random  Result Value Ref Range Status   Specimen Description   Final    URINE, RANDOM Performed at [facility], [address], [place], [place] [address]    Special Requests   Final    NONE Reflexed from [id number] Performed at [facility], [address], [place], [place] [address]    Culture >=100,000 COLONIES/mL KLEBSIELLA SPECIES (A)  Final   Report Status 09/06/2023 FINAL  Final   Organism ID, Bacteria KLEBSIELLA SPECIES (A)  Final      Susceptibility   Klebsiella species - MIC*    AMPICILLIN RESISTANT Resistant     CEFAZOLIN (URINE) Value in next row Sensitive      4 SENSITIVEThis is a modified FDA-approved test that has been validated and its performance characteristics determined by the reporting laboratory.  This laboratory is certified under the Clinical Laboratory Improvement Amendments CLIA as qualified to perform high complexity clinical laboratory testing.    CEFEPIME Value in next row Sensitive      4 SENSITIVEThis is a modified FDA-approved test that has been validated and its performance characteristics determined by the reporting laboratory.  This laboratory is certified under the Clinical Laboratory Improvement Amendments CLIA as qualified to perform high complexity clinical laboratory testing.    ERTAPENEM Value in next row Sensitive      4 SENSITIVEThis is a modified FDA-approved test that has been validated and its performance characteristics determined by the reporting laboratory.  This laboratory is certified under the Clinical Laboratory Improvement Amendments CLIA as qualified to perform high complexity  clinical laboratory testing.    CEFTRIAXONE  Value in next row Sensitive      4 SENSITIVEThis is a modified FDA-approved test that has been validated and its performance characteristics determined by the reporting laboratory.  This laboratory is certified under the Clinical Laboratory Improvement Amendments CLIA as qualified to perform high complexity clinical laboratory testing.    CIPROFLOXACIN  Value in next row Sensitive      4 SENSITIVEThis is a modified FDA-approved test that has been validated and its performance characteristics determined by the reporting laboratory.  This laboratory is certified under the Clinical Laboratory Improvement Amendments CLIA as qualified to perform high complexity clinical laboratory testing.    GENTAMICIN Value in next row Sensitive      4 SENSITIVEThis is a modified FDA-approved test that has been validated and its performance characteristics determined by the reporting laboratory.  This laboratory is certified under the Clinical Laboratory Improvement Amendments CLIA as qualified to perform high complexity clinical laboratory testing.    NITROFURANTOIN Value in next row Sensitive      4 SENSITIVEThis is a modified FDA-approved test that has been validated and its performance characteristics determined by the reporting laboratory.  This laboratory is certified under the Clinical Laboratory Improvement Amendments CLIA as qualified to perform high complexity clinical laboratory testing.    TRIMETH/SULFA Value in next row Sensitive      4 SENSITIVEThis is a modified FDA-approved test that has been validated and its performance characteristics determined by the reporting laboratory.  This laboratory is certified under the  Clinical Laboratory Improvement Amendments CLIA as qualified to perform high complexity clinical laboratory testing.    AMPICILLIN/SULBACTAM Value in next row Sensitive      4 SENSITIVEThis is a modified FDA-approved test that has been validated and its  performance characteristics determined by the reporting laboratory.  This laboratory is certified under the Clinical Laboratory Improvement Amendments CLIA as qualified to perform high complexity clinical laboratory testing.    PIP/TAZO Value in next row Sensitive ug/mL     <=4 SENSITIVEThis is a modified FDA-approved test that has been validated and its performance characteristics determined by the reporting laboratory.  This laboratory is certified under the Clinical Laboratory Improvement Amendments CLIA as qualified to perform high complexity clinical laboratory testing.    MEROPENEM Value in next row Sensitive      <=4 SENSITIVEThis is a modified FDA-approved test that has been validated and its performance characteristics determined by the reporting laboratory.  This laboratory is certified under the Clinical Laboratory Improvement Amendments CLIA as qualified to perform high complexity clinical laboratory testing.    * >=100,000 COLONIES/mL KLEBSIELLA SPECIES    Pt is a 68 yoM that presented to the ED with a dislodged foley catheter. Patient denies urinary sx. The catheter was replaced in the ED. Urinalysis and reflex urine culture was obtained prior to the catheter change. The urine culture grew Klebsiella spp, which I deem to be a colonizer. No antibiotic treatment needed.   ED Provider: Susette Lash, PA C  [name], PharmD PGY1 Pharmacy Resident [facility]  09/07/2023 9:19 AM

## 2023-10-25 ENCOUNTER — Other Ambulatory Visit: Payer: Self-pay

## 2023-10-25 ENCOUNTER — Emergency Department (HOSPITAL_COMMUNITY)

## 2023-10-25 ENCOUNTER — Inpatient Hospital Stay (HOSPITAL_COMMUNITY)
Admission: EM | Admit: 2023-10-25 | Discharge: 2023-10-30 | DRG: 065 | Disposition: A | Discharge status: Hospice | Attending: Family Medicine | Admitting: Family Medicine

## 2023-10-25 DIAGNOSIS — R32 Unspecified urinary incontinence: Secondary | ICD-10-CM | POA: Diagnosis present

## 2023-10-25 DIAGNOSIS — Z638 Other specified problems related to primary support group: Secondary | ICD-10-CM

## 2023-10-25 DIAGNOSIS — I63512 Cerebral infarction due to unspecified occlusion or stenosis of left middle cerebral artery: Secondary | ICD-10-CM | POA: Diagnosis not present

## 2023-10-25 DIAGNOSIS — Z7902 Long term (current) use of antithrombotics/antiplatelets: Secondary | ICD-10-CM

## 2023-10-25 DIAGNOSIS — E1110 Type 2 diabetes mellitus with ketoacidosis without coma: Secondary | ICD-10-CM | POA: Diagnosis not present

## 2023-10-25 DIAGNOSIS — N3000 Acute cystitis without hematuria: Secondary | ICD-10-CM

## 2023-10-25 DIAGNOSIS — R1310 Dysphagia, unspecified: Secondary | ICD-10-CM | POA: Diagnosis present

## 2023-10-25 DIAGNOSIS — I10 Essential (primary) hypertension: Secondary | ICD-10-CM | POA: Diagnosis present

## 2023-10-25 DIAGNOSIS — G9389 Other specified disorders of brain: Secondary | ICD-10-CM | POA: Diagnosis present

## 2023-10-25 DIAGNOSIS — N400 Enlarged prostate without lower urinary tract symptoms: Secondary | ICD-10-CM | POA: Diagnosis present

## 2023-10-25 DIAGNOSIS — Z6820 Body mass index (BMI) 20.0-20.9, adult: Secondary | ICD-10-CM

## 2023-10-25 DIAGNOSIS — R471 Dysarthria and anarthria: Secondary | ICD-10-CM | POA: Diagnosis present

## 2023-10-25 DIAGNOSIS — Z66 Do not resuscitate: Secondary | ICD-10-CM | POA: Diagnosis present

## 2023-10-25 DIAGNOSIS — Z789 Other specified health status: Secondary | ICD-10-CM

## 2023-10-25 DIAGNOSIS — K219 Gastro-esophageal reflux disease without esophagitis: Secondary | ICD-10-CM | POA: Diagnosis present

## 2023-10-25 DIAGNOSIS — Z7189 Other specified counseling: Secondary | ICD-10-CM | POA: Diagnosis not present

## 2023-10-25 DIAGNOSIS — F32A Depression, unspecified: Secondary | ICD-10-CM | POA: Diagnosis present

## 2023-10-25 DIAGNOSIS — R159 Full incontinence of feces: Secondary | ICD-10-CM | POA: Diagnosis present

## 2023-10-25 DIAGNOSIS — F1910 Other psychoactive substance abuse, uncomplicated: Secondary | ICD-10-CM | POA: Diagnosis present

## 2023-10-25 DIAGNOSIS — I63511 Cerebral infarction due to unspecified occlusion or stenosis of right middle cerebral artery: Principal | ICD-10-CM | POA: Diagnosis present

## 2023-10-25 DIAGNOSIS — R739 Hyperglycemia, unspecified: Secondary | ICD-10-CM | POA: Insufficient documentation

## 2023-10-25 DIAGNOSIS — Z515 Encounter for palliative care: Secondary | ICD-10-CM

## 2023-10-25 DIAGNOSIS — Z888 Allergy status to other drugs, medicaments and biological substances status: Secondary | ICD-10-CM

## 2023-10-25 DIAGNOSIS — F17210 Nicotine dependence, cigarettes, uncomplicated: Secondary | ICD-10-CM | POA: Diagnosis present

## 2023-10-25 DIAGNOSIS — R400 Somnolence: Principal | ICD-10-CM

## 2023-10-25 DIAGNOSIS — Z79899 Other long term (current) drug therapy: Secondary | ICD-10-CM

## 2023-10-25 DIAGNOSIS — E860 Dehydration: Secondary | ICD-10-CM | POA: Diagnosis present

## 2023-10-25 DIAGNOSIS — Z794 Long term (current) use of insulin: Secondary | ICD-10-CM

## 2023-10-25 DIAGNOSIS — I252 Old myocardial infarction: Secondary | ICD-10-CM | POA: Diagnosis not present

## 2023-10-25 DIAGNOSIS — I7090 Unspecified atherosclerosis: Secondary | ICD-10-CM | POA: Diagnosis not present

## 2023-10-25 DIAGNOSIS — E1140 Type 2 diabetes mellitus with diabetic neuropathy, unspecified: Secondary | ICD-10-CM | POA: Diagnosis present

## 2023-10-25 DIAGNOSIS — F4310 Post-traumatic stress disorder, unspecified: Secondary | ICD-10-CM | POA: Diagnosis present

## 2023-10-25 DIAGNOSIS — G9349 Other encephalopathy: Secondary | ICD-10-CM | POA: Diagnosis present

## 2023-10-25 DIAGNOSIS — N179 Acute kidney failure, unspecified: Secondary | ICD-10-CM | POA: Diagnosis present

## 2023-10-25 DIAGNOSIS — Z7951 Long term (current) use of inhaled steroids: Secondary | ICD-10-CM

## 2023-10-25 DIAGNOSIS — R410 Disorientation, unspecified: Secondary | ICD-10-CM | POA: Diagnosis not present

## 2023-10-25 DIAGNOSIS — Z8673 Personal history of transient ischemic attack (TIA), and cerebral infarction without residual deficits: Secondary | ICD-10-CM

## 2023-10-25 DIAGNOSIS — E785 Hyperlipidemia, unspecified: Secondary | ICD-10-CM | POA: Diagnosis present

## 2023-10-25 DIAGNOSIS — I2510 Atherosclerotic heart disease of native coronary artery without angina pectoris: Secondary | ICD-10-CM | POA: Diagnosis present

## 2023-10-25 DIAGNOSIS — I639 Cerebral infarction, unspecified: Secondary | ICD-10-CM | POA: Diagnosis not present

## 2023-10-25 DIAGNOSIS — Z7982 Long term (current) use of aspirin: Secondary | ICD-10-CM

## 2023-10-25 DIAGNOSIS — I6389 Other cerebral infarction: Secondary | ICD-10-CM | POA: Diagnosis not present

## 2023-10-25 DIAGNOSIS — Z716 Tobacco abuse counseling: Secondary | ICD-10-CM

## 2023-10-25 DIAGNOSIS — E440 Moderate protein-calorie malnutrition: Secondary | ICD-10-CM | POA: Diagnosis present

## 2023-10-25 DIAGNOSIS — R4182 Altered mental status, unspecified: Secondary | ICD-10-CM | POA: Diagnosis present

## 2023-10-25 DIAGNOSIS — N390 Urinary tract infection, site not specified: Secondary | ICD-10-CM | POA: Insufficient documentation

## 2023-10-25 DIAGNOSIS — Z7984 Long term (current) use of oral hypoglycemic drugs: Secondary | ICD-10-CM

## 2023-10-25 DIAGNOSIS — E1165 Type 2 diabetes mellitus with hyperglycemia: Secondary | ICD-10-CM | POA: Diagnosis present

## 2023-10-25 LAB — AMMONIA: Ammonia: 22 umol/L (ref 9–35)

## 2023-10-25 LAB — I-STAT VENOUS BLOOD GAS, ED
Acid-base deficit: 3 mmol/L — ABNORMAL HIGH (ref 0.0–2.0)
Bicarbonate: 22.4 mmol/L (ref 20.0–28.0)
Calcium, Ion: 1.13 mmol/L — ABNORMAL LOW (ref 1.15–1.40)
HCT: 39 % (ref 39.0–52.0)
Hemoglobin: 13.3 g/dL (ref 13.0–17.0)
O2 Saturation: 79 %
Potassium: 5.3 mmol/L — ABNORMAL HIGH (ref 3.5–5.1)
Sodium: 145 mmol/L (ref 135–145)
TCO2: 24 mmol/L (ref 22–32)
pCO2, Ven: 39.6 mmHg — ABNORMAL LOW (ref 44–60)
pH, Ven: 7.361 (ref 7.25–7.43)
pO2, Ven: 45 mmHg (ref 32–45)

## 2023-10-25 LAB — URINALYSIS, W/ REFLEX TO CULTURE (INFECTION SUSPECTED)
Bilirubin Urine: NEGATIVE
Glucose, UA: >=500 mg/dL — AB
Hgb urine dipstick: NEGATIVE
Ketones, ur: 20 mg/dL — AB
Leukocytes,Ua: NEGATIVE
Nitrite: POSITIVE — AB
Protein, ur: NEGATIVE mg/dL
Specific Gravity, Urine: 1.02 (ref 1.005–1.030)
pH: 5 (ref 5.0–8.0)

## 2023-10-25 LAB — BASIC METABOLIC PANEL WITH GFR
Anion gap: 20 — ABNORMAL HIGH (ref 5–15)
BUN: 39 mg/dL — ABNORMAL HIGH (ref 8–23)
CO2: 18 mmol/L — ABNORMAL LOW (ref 22–32)
Calcium: 9.3 mg/dL (ref 8.9–10.3)
Chloride: 108 mmol/L (ref 98–111)
Creatinine, Ser: 1.36 mg/dL — ABNORMAL HIGH (ref 0.61–1.24)
GFR, Estimated: 56 mL/min — ABNORMAL LOW (ref >60)
Glucose, Bld: 229 mg/dL — ABNORMAL HIGH (ref 70–99)
Potassium: 5 mmol/L (ref 3.5–5.1)
Sodium: 146 mmol/L — ABNORMAL HIGH (ref 135–145)

## 2023-10-25 LAB — COMPREHENSIVE METABOLIC PANEL WITH GFR
ALT: 29 U/L (ref 0–44)
AST: 24 U/L (ref 15–41)
Albumin: 3.8 g/dL (ref 3.5–5.0)
Alkaline Phosphatase: 64 U/L (ref 38–126)
Anion gap: 18 — ABNORMAL HIGH (ref 5–15)
BUN: 43 mg/dL — ABNORMAL HIGH (ref 8–23)
CO2: 18 mmol/L — ABNORMAL LOW (ref 22–32)
Calcium: 9.3 mg/dL (ref 8.9–10.3)
Chloride: 106 mmol/L (ref 98–111)
Creatinine, Ser: 1.64 mg/dL — ABNORMAL HIGH (ref 0.61–1.24)
GFR, Estimated: 45 mL/min — ABNORMAL LOW (ref >60)
Glucose, Bld: 308 mg/dL — ABNORMAL HIGH (ref 70–99)
Potassium: 5.4 mmol/L — ABNORMAL HIGH (ref 3.5–5.1)
Sodium: 142 mmol/L (ref 135–145)
Total Bilirubin: 1.4 mg/dL — ABNORMAL HIGH (ref 0.0–1.2)
Total Protein: 7.6 g/dL (ref 6.5–8.1)

## 2023-10-25 LAB — CBC WITH DIFFERENTIAL/PLATELET
Abs Immature Granulocytes: 0.04 K/uL (ref 0.00–0.07)
Basophils Absolute: 0 K/uL (ref 0.0–0.1)
Basophils Relative: 0 %
Eosinophils Absolute: 0.1 K/uL (ref 0.0–0.5)
Eosinophils Relative: 1 %
HCT: 41.4 % (ref 39.0–52.0)
Hemoglobin: 12.6 g/dL — ABNORMAL LOW (ref 13.0–17.0)
Immature Granulocytes: 1 %
Lymphocytes Relative: 21 %
Lymphs Abs: 1.3 K/uL (ref 0.7–4.0)
MCH: 27 pg (ref 26.0–34.0)
MCHC: 30.4 g/dL (ref 30.0–36.0)
MCV: 88.8 fL (ref 80.0–100.0)
Monocytes Absolute: 0.6 K/uL (ref 0.1–1.0)
Monocytes Relative: 9 %
Neutro Abs: 4.4 K/uL (ref 1.7–7.7)
Neutrophils Relative %: 68 %
Platelets: 280 K/uL (ref 150–400)
RBC: 4.66 MIL/uL (ref 4.22–5.81)
RDW: 13.2 % (ref 11.5–15.5)
WBC: 6.5 K/uL (ref 4.0–10.5)
nRBC: 0 % (ref 0.0–0.2)

## 2023-10-25 LAB — GLUCOSE, CAPILLARY: Glucose-Capillary: 215 mg/dL — ABNORMAL HIGH (ref 70–99)

## 2023-10-25 LAB — RAPID URINE DRUG SCREEN, HOSP PERFORMED
Amphetamines: NOT DETECTED
Barbiturates: NOT DETECTED
Benzodiazepines: NOT DETECTED
Cocaine: NOT DETECTED
Opiates: NOT DETECTED
Tetrahydrocannabinol: NOT DETECTED

## 2023-10-25 LAB — CBG MONITORING, ED: Glucose-Capillary: 285 mg/dL — ABNORMAL HIGH (ref 70–99)

## 2023-10-25 LAB — ETHANOL: Alcohol, Ethyl (B): <15 mg/dL (ref <15)

## 2023-10-25 MED ORDER — INSULIN REGULAR(HUMAN) IN NACL 100-0.9 UT/100ML-% IV SOLN: INTRAVENOUS | Status: DC

## 2023-10-25 MED ORDER — DEXTROSE 50 % IV SOLN: 0.0000 mL | INTRAVENOUS | Status: DC | PRN

## 2023-10-25 MED ORDER — LACTATED RINGERS IV BOLUS
1000.0000 mL | Freq: Once | INTRAVENOUS | Status: AC
Start: 2023-10-25 — End: 2023-10-25
  Administered 2023-10-25: 1000 mL via INTRAVENOUS

## 2023-10-25 MED ORDER — LACTATED RINGERS IV BOLUS
1200.0000 mL | Freq: Once | INTRAVENOUS | Status: AC
  Administered 2023-10-25: 1000 mL via INTRAVENOUS

## 2023-10-25 MED ORDER — SODIUM CHLORIDE 0.9 % IV SOLN
1.0000 g | Freq: Once | INTRAVENOUS | Status: AC
  Administered 2023-10-26: 1 g via INTRAVENOUS
  Filled 2023-10-25: qty 10

## 2023-10-25 MED ORDER — LACTATED RINGERS IV SOLN: INTRAVENOUS | Status: DC

## 2023-10-25 MED ORDER — DEXTROSE IN LACTATED RINGERS 5 % IV SOLN: INTRAVENOUS | Status: DC

## 2023-10-25 MED ORDER — INSULIN ASPART 100 UNIT/ML IJ SOLN
0.0000 [IU] | INTRAMUSCULAR | Status: DC
  Administered 2023-10-26: 4 [IU] via SUBCUTANEOUS
  Administered 2023-10-26 (×2): 3 [IU] via SUBCUTANEOUS

## 2023-10-25 MED ORDER — ENOXAPARIN SODIUM 40 MG/0.4ML IJ SOSY
40.0000 mg | PREFILLED_SYRINGE | INTRAMUSCULAR | Status: DC
  Administered 2023-10-26 – 2023-10-29 (×5): 40 mg via SUBCUTANEOUS
  Filled 2023-10-25 (×5): qty 0.4

## 2023-10-25 NOTE — ED Triage Notes (Signed)
 [facility] arriving with pt from home.   Family called for the patient initially for AMS. Stated that he was talking less and not following commands. EMS states another family member disagreed and stated he is at his baseline. So the information is conflicting - but the patient appears lethargic and is not speaking at this time. Vague complaints. One family member on the way.   LKN 1700 10/7  EMS vitals:  CBG 420 HR 76 BP 138/74 SpO2 96 on RA   EMS interventions: 500mL NS bolus 20G L forearm

## 2023-10-25 NOTE — Plan of Care (Signed)
 FMTS Interim Progress Note  Patient admitted to [facility] which does not have capacity to run insulin  drips. Spoke with pharmacy and discussed the following plan: - CBG checks q4hrs - Resistant sliding scale - BMP checks q4hrs - D5 @ 125 mL/hr - If hypoglycemic, can increase rate of D5  Zoe Goonan, DO 10/25/2023, 10:06 PM PGY-1, [facility] Service pager [phone]

## 2023-10-25 NOTE — Hospital Course (Addendum)
 William Acevedo is a 68 y.o.male with history of HTN, HLD, T2DM, h/o stroke, PTSD, depression, and h/o EtOH and cocaine use who was admitted to the [facility] Medicine Teaching Service at [facility] for AMS. His hospital course is outlined below:  Acute multifocal CVA Presented with AMS as described by brother/caregiver in the setting of multiple prior CVAs on ASA and Plavix  with limited functionality at baseline.  MRI brain showed multifocal acute infarct, neurology consulted.  CT angio head and neck showed new high-grade stenosis, no interventions or changes to med regimen.  Continue on home statin and ezetimibe .  Echo without acute etiology.  Brother/caregiver opted to transition to home with hospice services. [facility] hospice in place at discharge.  Hyperglycemia Elevated anion gap with normal pH, initiated on Endotool but quickly transition to sq insulin .  Titrated to Lantus  20 units daily and continued on sliding scale at discharge.  Other chronic conditions were medically managed with home medications and formulary alternatives as necessary (HTN, HLD, T2DM, PTSD, GERD, BPH, malnutrition)  Follow-up recommendations: Titrate home insulin  regimen as indicated

## 2023-10-25 NOTE — ED Provider Notes (Addendum)
 [name] Provider Note   CSN: [id number] Arrival date & time: 10/25/23  1349     Patient presents with: Altered Mental Status   William Acevedo is a 68 y.o. male.   Pt is a 68y/o male with hx of hypertension, hyperlipidemia, diabetes mellitus type 2, history of CVA, history of PTSD, with depression anxiety as well as prior cocaine and alcohol use who had been in a skilled facility since having his stroke in May but was taken out by his family within the last few months because they did not feel he was receiving good care and has otherwise been in his home with a caregiver.  It is reported that patient does get into a wheelchair but is otherwise in the bed.  His caregiver got there at noon today and reports his brother was concerned because he did not feel like he was acting right.  The caregiver reports that he does sleep a lot but today he has seemed much more altered as he is not been waking up appropriately or answering questions like he normally would.  Yesterday seem like a normal day per the caregiver who saw him last at 5 PM last night.  There was no reports of any falls.  There has not been any fever.  EMS reports that patient's blood sugar was in the 400 range but otherwise vital signs were normal.  He has been taking his medications and there has been no recent medication changes.  Caregiver denied any seizure-like activity. Also caregiver reports patient's Foley catheter was removed about 3 weeks ago he had a short course of antibiotic when they removed it and he has been making urine since that time and wears a condom cath at night so he does not get all wet.  The history is provided by the EMS personnel, medical records and a caregiver.  Altered Mental Status      Prior to Admission medications   Medication Sig Start Date End Date Taking? Authorizing Provider  amLODipine  (NORVASC ) 5 MG tablet Take 1 tablet (5 mg total) by mouth  daily. 05/20/23   Lue Elsie JAYSON, MD  aspirin  EC 81 MG tablet Take 1 tablet (81 mg total) by mouth daily. Swallow whole. 05/20/23   Lue Elsie JAYSON, MD  atorvastatin  (LIPITOR ) 80 MG tablet Take 1 tablet (80 mg total) by mouth daily. 07/19/22 05/13/24  Perri DELENA Meliton Mickey., MD  cephALEXin  (KEFLEX ) 500 MG capsule Take 1 capsule (500 mg total) by mouth 4 (four) times daily. 06/22/23   Laurice Maude JAYSON, MD  clopidogrel  (PLAVIX ) 75 MG tablet Take 1 tablet by mouth daily. 08/29/22   [provider]  dapagliflozin  propanediol (FARXIGA ) 10 MG TABS tablet Take 1 tablet by mouth daily. 08/29/22   [provider]  DULoxetine  (CYMBALTA ) 60 MG capsule Take 1 capsule (60 mg total) by mouth daily. For chronic pain and mood 07/19/22 05/13/24  Perri DELENA Meliton Mickey., MD  ezetimibe  (ZETIA ) 10 MG tablet Take 1 tablet (10 mg total) by mouth daily. 07/20/22 05/13/24  Perri DELENA Meliton Mickey., MD  famotidine  (PEPCID ) 20 MG tablet Take 1 tablet (20 mg total) by mouth daily before breakfast. 07/19/22 06/12/24  Perri DELENA Meliton Mickey., MD  folic acid  (FOLVITE ) 1 MG tablet Take 1 tablet by mouth daily. 08/29/22   [provider]  hydrOXYzine  (ATARAX ) 25 MG tablet Take 25 mg by mouth at bedtime. 04/17/23   [provider]  insulin  glargine (  LANTUS ) 100 UNIT/ML Solostar Pen Inject 10 Units into the skin daily. Please follow up with your PCP for adjustments to your insulin  regimen Patient taking differently: Inject 20 Units into the skin at bedtime. Please follow up with your PCP for adjustments to your insulin  regimen 05/20/23   Lue Elsie BROCKS, MD  insulin  lispro (HUMALOG ) 100 UNIT/ML injection Inject 0.04 mLs (4 Units total) into the skin 3 (three) times daily with meals. Please follow up with your PCP for adjustments of your insulin  regimen Patient taking differently: Inject 4 Units into the skin 3 (three) times daily with meals. Also given per sliding scale, if 151-200= 2, 201-250= 4, 251-300= 6,  301-350= 8, 351-400= 8, if BG >400, give 10 units Please follow up with your PCP for adjustments of your insulin  regimen 07/19/22   Perri DELENA Meliton Mickey., MD  Lancet Device MISC 1 each by Does not apply route 3 (three) times daily. May dispense any manufacturer covered by patient's insurance. 07/19/22   Perri DELENA Meliton Mickey., MD  metFORMIN  (GLUCOPHAGE ) 500 MG tablet Take 1 tablet (500 mg total) by mouth 2 (two) times daily with a meal. Follow up with your PCP to discuss uptitration of this medicine Patient taking differently: Take 500-1,000 mg by mouth See admin instructions. Give 2 tablet by mouth in the morning and 1 tablet at bedtime Follow up with your PCP to discuss uptitration of this medicine 07/19/22 06/12/24  Perri DELENA Meliton Mickey., MD  metoprolol  succinate (TOPROL -XL) 50 MG 24 hr tablet Take 1 tablet (50 mg total) by mouth daily. Take with or immediately following a meal. 07/19/22 06/12/24  Perri DELENA Meliton Mickey., MD  naloxone  (NARCAN ) nasal spray 4 mg/0.1 mL Place 1 spray into the nose once. SPRAY 1 SPRAY INTO ONE NOSTRIL AS DIRECTED FOR OPIOID OVERDOSE - CALL 911 IMMEDIATELY, ADMINISTER DOSE, THEN TURN PERSON ON SIDE - IF NO RESPONSE IN 2-3 MINUTES OR PERSON RESPONDS BUT RELAPSES, REPEAT USING A NEW SPRAY DEVICE AND SPRAY INTO THE OTHER NOSTRIL Patient not taking: Reported on 06/13/2023 03/15/23   [provider]  OLANZapine  (ZYPREXA ) 5 MG tablet Take 5 mg by mouth at bedtime. 04/17/23   [provider]  ondansetron  (ZOFRAN ) 4 MG tablet Take 1 tablet (4 mg total) by mouth every 8 (eight) hours as needed for nausea or vomiting. 06/16/23   Sherrill Cable Latif, DO  tamsulosin  (FLOMAX ) 0.4 MG CAPS capsule Take 0.4 mg by mouth at bedtime. 04/17/23   [provider]  thiamine  (VITAMIN B-1) 100 MG tablet Take 1 tablet (100 mg total) by mouth daily. 05/20/23   Lue Elsie BROCKS, MD  traZODone  (DESYREL ) 50 MG tablet Take 1 tablet (50 mg total) by mouth at bedtime. 05/20/23 07/19/23   Lue Elsie BROCKS, MD    Allergies: Lisinopril and Omeprazole    Review of Systems  Updated Vital Signs BP 117/74   Pulse 72   Temp 98.2 F (36.8 C) (Rectal)   Resp 20   Physical Exam Vitals and nursing note reviewed.  Constitutional:      Appearance: He is well-developed.     Comments: Patient is drowsy and keeps his eyes closed most of the time and will not respond to questions however he is noted to try to pull his arm away or use his leg to push off of the bed when attempting to stimulate or move him  HENT:     Head: Normocephalic and atraumatic.     Mouth/Throat:  Mouth: Mucous membranes are dry.  Eyes:     Extraocular Movements: Extraocular movements intact.     Conjunctiva/sclera: Conjunctivae normal.     Pupils: Pupils are equal, round, and reactive to light.  Cardiovascular:     Rate and Rhythm: Normal rate and regular rhythm.     Heart sounds: No murmur heard. Pulmonary:     Effort: Pulmonary effort is normal. No respiratory distress.     Breath sounds: Normal breath sounds. No wheezing or rales.  Abdominal:     General: There is no distension.     Palpations: Abdomen is soft.     Tenderness: There is no abdominal tenderness. There is no guarding or rebound.  Musculoskeletal:        General: No tenderness. Normal range of motion.     Cervical back: Normal range of motion and neck supple.  Skin:    General: Skin is warm and dry.     Findings: No erythema or rash.  Neurological:     Comments: Patient keeps his eyes closed most of the time and appears groggy.  He is noted to move upper and lower extremities and has resistance when attempting to straighten out his extremities.  He is not speaking  Psychiatric:     Comments: Uncooperative     (all labs ordered are listed, but only abnormal results are displayed) Labs Reviewed  CBC WITH DIFFERENTIAL/PLATELET - Abnormal; Notable for the following components:      Result Value   Hemoglobin 12.6 (*)     All other components within normal limits  CBG MONITORING, ED - Abnormal; Notable for the following components:   Glucose-Capillary 285 (*)    All other components within normal limits  I-STAT VENOUS BLOOD GAS, ED - Abnormal; Notable for the following components:   pCO2, Ven 39.6 (*)    Acid-base deficit 3.0 (*)    Potassium 5.3 (*)    Calcium , Ion 1.13 (*)    All other components within normal limits  COMPREHENSIVE METABOLIC PANEL WITH GFR  URINALYSIS, W/ REFLEX TO CULTURE (INFECTION SUSPECTED)  ETHANOL  AMMONIA     EKG: None  Radiology: DG Chest Port 1 View Result Date: 10/25/2023 CLINICAL DATA:  Altered mental status. EXAM: PORTABLE CHEST 1 VIEW COMPARISON:  May 13, 2023 FINDINGS: The heart size and mediastinal contours are within normal limits. There is marked severity calcification of the aortic arch. No acute infiltrate, pleural effusion or pneumothorax is identified. No acute osseous abnormalities are seen. IMPRESSION: No active cardiopulmonary disease. Electronically Signed   By: Suzen Dials M.D.   On: 10/25/2023 14:40   CT Head Wo Contrast Result Date: 10/25/2023 CLINICAL DATA:  Altered mental status. EXAM: CT HEAD WITHOUT CONTRAST TECHNIQUE: Contiguous axial images were obtained from the base of the skull through the vertex without intravenous contrast. RADIATION DOSE REDUCTION: This exam was performed according to the departmental dose-optimization program which includes automated exposure control, adjustment of the mA and/or kV according to patient size and/or use of iterative reconstruction technique. COMPARISON:  May 13, 2023 FINDINGS: Brain: There is generalized cerebral atrophy with widening of the extra-axial spaces and ventricular dilatation. There are areas of decreased attenuation within the white matter tracts of the supratentorial brain, consistent with microvascular disease changes. Chronic bilateral cerebellar, bilateral occipital lobe, left frontal lobe and  right parietal lobe infarcts are seen. Vascular: Marked severity bilateral cavernous carotid artery calcification is noted. Skull: A chronic nondisplaced left-sided nasal bone fracture is present. A chronic fracture  of the left zygomatic arch is also seen. Sinuses/Orbits: No acute finding. Other: None. IMPRESSION: 1. Generalized cerebral atrophy with chronic bilateral cerebellar, bilateral occipital lobe, left frontal lobe and right parietal lobe infarcts. 2. No acute intracranial abnormality. 3. Chronic nondisplaced left-sided nasal bone fracture. 4. Chronic fracture of the left zygomatic arch. Electronically Signed   By: Suzen Dials M.D.   On: 10/25/2023 14:39     Procedures   Medications Ordered in the ED - No data to display                                  Medical Decision Making Amount and/or Complexity of Data Reviewed Independent Historian: EMS External Data Reviewed: notes. Labs: ordered. Decision-making details documented in ED Course. Radiology: ordered and independent interpretation performed. Decision-making details documented in ED Course. ECG/medicine tests: ordered and independent interpretation performed. Decision-making details documented in ED Course.   Pt with multiple medical problems and comorbidities and presenting today with a complaint that caries a high risk for morbidity and mortality.  Here today with altered mental status.  Concern for possible infection versus recurrent stroke or intracranial bleed versus medication reaction versus electrolyte abnormality, dehydration and AKI, elevated ammonia .  Patient initially did have a blood sugar of over 400 and EMS gave IV fluids on recheck here blood sugar is 285.  I independently interpreted patient's labs and EKG.  VBG with normal CO2 levels and pH, CBC without acute findings, EKG without acute findings today.  Ammonia  levels and EtOH are also normal I have independently visualized and interpreted pt's images today.   Chest x-ray within normal limits and head CT without evidence of bleed.  Radiology reports generalized cerebral atrophy with chronic bilateral cerebellar occipital infarcts as well as chronic left frontal lobar and right parietal lobe infarcts.  Patient does have olanzapine  and trazodone  on his medication list and unclear if he is currently taking those or could be a result of his sedation.  However patient has not had any change in those medications.  3:50 PM On repeat evaluation patient is still altered.  Urine is pending collection.  If urine is normal then patient will most likely need an MRI for further evaluation for altered mental status.        Final diagnoses:  None    ED Discharge Orders     None          Doretha Folks, MD 10/25/23 1550    Doretha Folks, MD 10/25/23 1550

## 2023-10-25 NOTE — ED Provider Notes (Signed)
 Care assumed from Dr. Doretha.  At time of transfer of care, patient is awaiting urinalysis completion to see if patient is having UTI causing this acute altered mental status and encephalopathy.  Previous team suspects he will need admission.  If urinalysis is clean, previous team suspected we may need to get MRI to rule out stroke.  Anticipate admission after workup is completed.  7:11 PM Urinalysis indeed shows nitrites and bacteria, given the concern for UTI and altered mental status, will order antibiotics and admit for altered mental status in the setting of UTI.   Patient will be admitted for further management.    Clinical Impression: 1. Somnolence   2. Acute cystitis without hematuria     Disposition: Admit  This note was prepared with assistance of Dragon voice recognition software. Occasional wrong-word or sound-a-like substitutions may have occurred due to the inherent limitations of voice recognition software.        Franziska Podgurski, Lonni PARAS, MD 10/25/23 [phone]

## 2023-10-25 NOTE — Assessment & Plan Note (Signed)
 Likely multifactorial to DKA v. UTI v. Stroke. VSS. Awake, alert, not oriented.  - Admit to FMTS, Med Tele, attending Dr. Brown - MRI brain to r/o acute stroke - Consider referral to neurology pending results - Continuous cardiac monitoring while r/o stroke, can DC once MRI back  DKA:  - Endotool started for DKA   - S/P 2 L bolus, fluids per endotool  - BMP q4hrs, monitor K  - transition off endotool once anion gap closes <12 x2 UTI: ruled out. WBC only 5 on UA. No systemic symptoms of UTI.  - Strict I/Os - bladder scan to r/o retention  - AM CBC, HbA1c, TSH - Delirium precautions - Consult SLP for swallow eval

## 2023-10-25 NOTE — H&P (Cosign Needed Addendum)
 Hospital Admission History and Physical Service Pager: [phone]  Patient name: William Acevedo Medical record number: [id number] Date of Birth: 1955-07-04 Age: 68 y.o. Gender: male  Primary Care Provider: Campbell Reynolds, NP Consultants: None Code Status: Full code which was confirmed with family if patient unable to confirm Preferred Emergency Contact: Doug Bucklin (legal guardian) [phone]  Chief Complaint: AMS  Assessment and Plan: William Acevedo is a 68 y.o. male with PMHx HTN, HLD, T2DM, h/o stroke, PTSD, depression, and h/o EtOH and cocaine use presenting with AMS.   Differential for presentation of this includes:   DKA: may be a component of current presentation given serum glucose 308 and elevated anion gap to 18. Starting Endotool.  Stroke (patient remains encephalopathic and has extensive CVA history, ruling out with MRI brain UTI: UA with nitrites and bacteria though bacteria may be 2/2 to previous use of chronic indwelling foley catheter.  Urine retention: history of outlet obstruction with chronic indwelling foley, patient pulled out foley on his own at SNF x2 and was not replaced.  Ingestion (h/o of EtOH and cocaine use) Assessment & Plan AMS (altered mental status) DKA, type 2 (HCC) UTI (urinary tract infection) Likely multifactorial to DKA v. UTI v. Stroke. VSS. Awake, alert, not oriented.  - Admit to FMTS, Med Tele, attending Dr. Brown - MRI brain to r/o acute stroke - Consider referral to neurology pending results - Continuous cardiac monitoring while r/o stroke, can DC once MRI back  DKA:  - Endotool started for DKA   - S/P 2 L bolus, fluids per endotool  - BMP q4hrs, monitor K  - transition off endotool once anion gap closes <12 x2 UTI: ruled out. WBC only 5 on UA. No systemic symptoms of UTI.  - Strict I/Os - bladder scan to r/o retention  - AM CBC, HbA1c, TSH - Delirium precautions - Consult SLP for swallow eval  AKI (acute kidney  injury) Cr 1.65, baseline 1.02 - s/p LR bolus x1 in ED, will give another - r/o postrenal obstruction with bladder scan  - check BMP AM  Substance abuse (HCC) Hx of cocaine and alcohol abuse.  - CIWA protocol  - clarify substance use history with legal guardian  - Add on rapid UDS to r/o ingestion Chronic health problem *holding home meds given NPO status HTN - holding home Amlodipine  5 mg daily, Toprol -XL 50 mg daily HLD - holding home Atorvastatin  80 mg daily, Zetia  10 mg daily T2DM - holding home Farxiga  10 mg daily, Metformin  500 mg BID, Lantus  20 units daily, Humalog  4 units TID with meals H/o of stroke - holding home ASA 81 mg daily, Clopidogrel  70 mg daily PTSD/Depression - holding home Cymbalta  60 mg daily, Olanzapine  5mg  daily at bedtime, Trazodone  daily at bedtime, Hydroxyzine  25 mg daily at bedtime GERD - holding home Pepcid  20 mg daily BPH - holding ome Flomax  0.4mg  daily at bedtime Malnutrition - holding home Folic acid  1 mg daily, Thiamine  100mg  daily; RD consult   FEN/GI: NPO VTE Prophylaxis: Lovenox   Disposition: Med Tele  History of Present Illness:  RHYTHM Acevedo is a 68 y.o. male presenting with AMS.  HPI limited due to AMS. Attempted to contact legal guardian without success. Will contact again at a later time to obtain further history.  Per ED note, patient was at a SNF since May but recently moved home with a caregiver. Patient's brother was concerned patient was not acting like himself and seemed more  altered and was not answering questions like normal prompting visit to the ED. No fever. Patient is compliant with all medications and no recent medications changes. Patient also had a Foley catheter removed about 3 weeks ago followed by a short course of abx. Patient wears a condom catheter at night to avoid bedwetting. In the ED, patient was given LR bolus x1 and CTX 1g x1. CT head showed no acute brain abnormality. CXR normal. Labs significant for UTI and  hyperglycemia with anion gap.    Review Of Systems: Per HPI with the following additions: None  Pertinent Past Medical History: HTN HLD T2DM Stroke PTSD Neuropathy Endocarditis Depression H/o EtOH use H/o Cocaine use  Remainder reviewed in history tab.   Pertinent Past Surgical History: Testicular torsion reduction TEE cardioversion Bubble study  Remainder reviewed in history tab.   Pertinent Social History: Tobacco use: limited history 2/2 AMS Alcohol use: h/o EtOH use Other Substance use: h/o cocaine use Lives with: caregiver per ED note  Pertinent Family History: Unable to obtain FMHx due to AMS  Remainder reviewed in history tab.   Important Outpatient Medications: Amlodipine  5 mg daily Aspirin  81 mg daily Atorvastatin  80 mg daily Clopidogrel  70 mg daily Farxiga  10 mg daily Cymbalta  60 mg daily Zetia  10 mg daily Pepcid  20 mg daily Folic acid  1 mg daily Hydroxyzine  25 mg daily at bedtime Lantus  20 units daily Humalog  4 units TID with meals Metformin  500 mg BID Toprol -XL 50 mg daily Olanzapine  5mg  daily at bedtime Zofran  4mg  daily q8hrs PRN Flomax  0.4mg  daily at bedtime Thiamine  100mg  daily Trazodone  daily at bedtime  Remainder reviewed in medication history.   Objective: BP 113/70   Pulse 79   Temp 98.3 F (36.8 C) (Oral)   Resp 16   SpO2 100%   Exam: General: Awake, alert, attempting to answer questions but easily side-tracked.  HEENT: No sign of trauma Cardiovascular: RRR, no m/r/g appreciated. Pulmonary: Normal effort of breathing on RA Abdomen: Soft, non-tender, non-distended. Extremities: Warm and well-perfused, without cyanosis or edema. Neurologic: Patient is not A&Ox3. EOMI, able to track movements. Withdraws to pain, moving all extremities fully. Speech mumbled and garbled. Normal hearing. Limited exam due to AMS.  Labs:  CBC BMET  Recent Labs  Lab 10/25/23 1424  WBC 6.5  HGB 12.6*  HCT 41.4  PLT 280   Recent Labs  Lab  10/25/23 1424  NA 142  K 5.4*  CL 106  CO2 18*  BUN 43*  CREATININE 1.64*  GLUCOSE 308*  CALCIUM  9.3    Pertinent additional labs:  UA with >500 glucose, 20 ketones, and positive nitrites Ammonia  normal Ethanol normal pCO2 39.6, K  EKG: Normal sinus rhythm  Imaging Studies Performed:  DG Chest Port 1 View No active cardiopulmonary disease.  CT Head Wo Contrast 1. Generalized cerebral atrophy with chronic bilateral cerebellar, bilateral occipital lobe, left frontal lobe and right parietal lobe infarcts.  2. No acute intracranial abnormality.  3. Chronic nondisplaced left-sided nasal bone fracture.  4. Chronic fracture of the left zygomatic arch.   [name], DO 10/25/2023, 7:20 PM PGY-1, [facility]  FPTS Intern pager: [phone], text pages welcome Secure chat group [facility] Teaching Service   I have reviewed the above note, agree with its content, and have made the appropriate changes.    Damien Pinal, DO [facility], PGY-3 10/25/23 9:18 PM

## 2023-10-25 NOTE — Assessment & Plan Note (Addendum)
 Cr 1.65, baseline 1.02 - s/p LR bolus x1 in ED, will give another - r/o postrenal obstruction with bladder scan  - check BMP AM

## 2023-10-25 NOTE — Assessment & Plan Note (Signed)
 Hx of cocaine and alcohol abuse.  - CIWA protocol  - clarify substance use history with legal guardian  - Add on rapid UDS to r/o ingestion

## 2023-10-25 NOTE — Assessment & Plan Note (Addendum)
*  holding home meds given NPO status HTN - holding home Amlodipine  5 mg daily, Toprol -XL 50 mg daily HLD - holding home Atorvastatin  80 mg daily, Zetia  10 mg daily T2DM - holding home Farxiga  10 mg daily, Metformin  500 mg BID, Lantus  20 units daily, Humalog  4 units TID with meals H/o of stroke - holding home ASA 81 mg daily, Clopidogrel  70 mg daily PTSD/Depression - holding home Cymbalta  60 mg daily, Olanzapine  5mg  daily at bedtime, Trazodone  daily at bedtime, Hydroxyzine  25 mg daily at bedtime GERD - holding home Pepcid  20 mg daily BPH - holding ome Flomax  0.4mg  daily at bedtime Malnutrition - holding home Folic acid  1 mg daily, Thiamine  100mg  daily; RD consult

## 2023-10-25 NOTE — Assessment & Plan Note (Addendum)
 Likely multifactorial to DKA v. UTI v. Stroke. VSS. Awake, alert, not oriented.  - Admit to FMTS, Med Tele, attending Dr. Brown - MRI brain to r/o acute stroke - Consider referral to neurology pending results - Continuous cardiac monitoring while r/o stroke, can DC once MRI back  DKA:  - Endotool started for DKA   - S/P 2 L bolus, fluids per endotool  - BMP q4hrs, monitor K  - transition off endotool once anion gap closes <12 x2 UTI: ruled out. WBC only 5 on UA. No systemic symptoms of UTI.  - Strict I/Os - bladder scan to r/o retention  - AM CBC, HbA1c, TSH - Delirium precautions - Consult SLP for swallow eval

## 2023-10-25 NOTE — ED Notes (Signed)
 Pt taken to CT.

## 2023-10-26 ENCOUNTER — Inpatient Hospital Stay (HOSPITAL_COMMUNITY)

## 2023-10-26 DIAGNOSIS — F1910 Other psychoactive substance abuse, uncomplicated: Secondary | ICD-10-CM

## 2023-10-26 DIAGNOSIS — R410 Disorientation, unspecified: Secondary | ICD-10-CM

## 2023-10-26 DIAGNOSIS — R739 Hyperglycemia, unspecified: Secondary | ICD-10-CM | POA: Insufficient documentation

## 2023-10-26 DIAGNOSIS — N179 Acute kidney failure, unspecified: Secondary | ICD-10-CM | POA: Diagnosis not present

## 2023-10-26 DIAGNOSIS — E1110 Type 2 diabetes mellitus with ketoacidosis without coma: Secondary | ICD-10-CM

## 2023-10-26 DIAGNOSIS — E440 Moderate protein-calorie malnutrition: Secondary | ICD-10-CM | POA: Insufficient documentation

## 2023-10-26 LAB — CBC
HCT: 41.5 % (ref 39.0–52.0)
Hemoglobin: 12.7 g/dL — ABNORMAL LOW (ref 13.0–17.0)
MCH: 26.6 pg (ref 26.0–34.0)
MCHC: 30.6 g/dL (ref 30.0–36.0)
MCV: 87 fL (ref 80.0–100.0)
Platelets: 286 K/uL (ref 150–400)
RBC: 4.77 MIL/uL (ref 4.22–5.81)
RDW: 13.1 % (ref 11.5–15.5)
WBC: 6.2 K/uL (ref 4.0–10.5)
nRBC: 0 % (ref 0.0–0.2)

## 2023-10-26 LAB — BASIC METABOLIC PANEL WITH GFR
Anion gap: 12 (ref 5–15)
Anion gap: 14 (ref 5–15)
Anion gap: 18 — ABNORMAL HIGH (ref 5–15)
Anion gap: 18 — ABNORMAL HIGH (ref 5–15)
Anion gap: 19 — ABNORMAL HIGH (ref 5–15)
BUN: 21 mg/dL (ref 8–23)
BUN: 22 mg/dL (ref 8–23)
BUN: 24 mg/dL — ABNORMAL HIGH (ref 8–23)
BUN: 30 mg/dL — ABNORMAL HIGH (ref 8–23)
BUN: 34 mg/dL — ABNORMAL HIGH (ref 8–23)
CO2: 19 mmol/L — ABNORMAL LOW (ref 22–32)
CO2: 21 mmol/L — ABNORMAL LOW (ref 22–32)
CO2: 22 mmol/L (ref 22–32)
CO2: 22 mmol/L (ref 22–32)
CO2: 23 mmol/L (ref 22–32)
Calcium: 8.7 mg/dL — ABNORMAL LOW (ref 8.9–10.3)
Calcium: 8.9 mg/dL (ref 8.9–10.3)
Calcium: 9.1 mg/dL (ref 8.9–10.3)
Calcium: 9.3 mg/dL (ref 8.9–10.3)
Calcium: 9.3 mg/dL (ref 8.9–10.3)
Chloride: 106 mmol/L (ref 98–111)
Chloride: 106 mmol/L (ref 98–111)
Chloride: 106 mmol/L (ref 98–111)
Chloride: 106 mmol/L (ref 98–111)
Chloride: 107 mmol/L (ref 98–111)
Creatinine, Ser: 1.1 mg/dL (ref 0.61–1.24)
Creatinine, Ser: 1.14 mg/dL (ref 0.61–1.24)
Creatinine, Ser: 1.17 mg/dL (ref 0.61–1.24)
Creatinine, Ser: 1.29 mg/dL — ABNORMAL HIGH (ref 0.61–1.24)
Creatinine, Ser: 1.29 mg/dL — ABNORMAL HIGH (ref 0.61–1.24)
GFR, Estimated: >60 mL/min (ref >60)
GFR, Estimated: >60 mL/min (ref >60)
GFR, Estimated: >60 mL/min (ref >60)
GFR, Estimated: >60 mL/min (ref >60)
GFR, Estimated: >60 mL/min (ref >60)
Glucose, Bld: 150 mg/dL — ABNORMAL HIGH (ref 70–99)
Glucose, Bld: 179 mg/dL — ABNORMAL HIGH (ref 70–99)
Glucose, Bld: 183 mg/dL — ABNORMAL HIGH (ref 70–99)
Glucose, Bld: 204 mg/dL — ABNORMAL HIGH (ref 70–99)
Glucose, Bld: 260 mg/dL — ABNORMAL HIGH (ref 70–99)
Potassium: 4.2 mmol/L (ref 3.5–5.1)
Potassium: 4.3 mmol/L (ref 3.5–5.1)
Potassium: 4.3 mmol/L (ref 3.5–5.1)
Potassium: 4.5 mmol/L (ref 3.5–5.1)
Potassium: 5 mmol/L (ref 3.5–5.1)
Sodium: 141 mmol/L (ref 135–145)
Sodium: 143 mmol/L (ref 135–145)
Sodium: 144 mmol/L (ref 135–145)
Sodium: 145 mmol/L (ref 135–145)
Sodium: 146 mmol/L — ABNORMAL HIGH (ref 135–145)

## 2023-10-26 LAB — HEMOGLOBIN A1C
Hgb A1c MFr Bld: 11 % — ABNORMAL HIGH (ref 4.8–5.6)
Mean Plasma Glucose: 269 mg/dL

## 2023-10-26 LAB — GLUCOSE, CAPILLARY
Glucose-Capillary: 127 mg/dL — ABNORMAL HIGH (ref 70–99)
Glucose-Capillary: 149 mg/dL — ABNORMAL HIGH (ref 70–99)
Glucose-Capillary: 182 mg/dL — ABNORMAL HIGH (ref 70–99)
Glucose-Capillary: 254 mg/dL — ABNORMAL HIGH (ref 70–99)
Glucose-Capillary: 281 mg/dL — ABNORMAL HIGH (ref 70–99)
Glucose-Capillary: 362 mg/dL — ABNORMAL HIGH (ref 70–99)
Glucose-Capillary: 337 mg/dL — ABNORMAL HIGH (ref 70–99)

## 2023-10-26 LAB — TSH: TSH: 0.638 u[IU]/mL (ref 0.350–4.500)

## 2023-10-26 MED ORDER — CLOPIDOGREL BISULFATE 75 MG PO TABS
75.0000 mg | ORAL_TABLET | Freq: Every day | ORAL | Status: DC
  Administered 2023-10-26 – 2023-10-30 (×5): 75 mg via ORAL
  Filled 2023-10-26 (×5): qty 1

## 2023-10-26 MED ORDER — ADULT MULTIVITAMIN W/MINERALS CH
1.0000 | ORAL_TABLET | Freq: Every day | ORAL | Status: DC
  Administered 2023-10-27 – 2023-10-30 (×4): 1 via ORAL
  Filled 2023-10-26 (×5): qty 1

## 2023-10-26 MED ORDER — GLUCERNA SHAKE PO LIQD
237.0000 mL | Freq: Three times a day (TID) | ORAL | Status: DC
  Administered 2023-10-26 – 2023-10-30 (×10): 237 mL via ORAL

## 2023-10-26 MED ORDER — FOLIC ACID 1 MG PO TABS
1.0000 mg | ORAL_TABLET | Freq: Every day | ORAL | Status: DC
  Administered 2023-10-27 – 2023-10-30 (×4): 1 mg via ORAL
  Filled 2023-10-26 (×5): qty 1

## 2023-10-26 MED ORDER — THIAMINE MONONITRATE 100 MG PO TABS
100.0000 mg | ORAL_TABLET | Freq: Every day | ORAL | Status: DC
  Administered 2023-10-27 – 2023-10-30 (×4): 100 mg via ORAL
  Filled 2023-10-26 (×5): qty 1

## 2023-10-26 MED ORDER — FAMOTIDINE 20 MG PO TABS
20.0000 mg | ORAL_TABLET | Freq: Every day | ORAL | Status: DC
  Administered 2023-10-27 – 2023-10-30 (×4): 20 mg via ORAL
  Filled 2023-10-26 (×4): qty 1

## 2023-10-26 MED ORDER — ASPIRIN 81 MG PO TBEC
81.0000 mg | DELAYED_RELEASE_TABLET | Freq: Every day | ORAL | Status: DC
  Administered 2023-10-26 – 2023-10-30 (×5): 81 mg via ORAL
  Filled 2023-10-26 (×6): qty 1

## 2023-10-26 MED ORDER — ATORVASTATIN CALCIUM 80 MG PO TABS
80.0000 mg | ORAL_TABLET | Freq: Every day | ORAL | Status: DC
  Administered 2023-10-26 – 2023-10-30 (×5): 80 mg via ORAL
  Filled 2023-10-26 (×5): qty 1

## 2023-10-26 MED ORDER — TRAZODONE HCL 50 MG PO TABS
50.0000 mg | ORAL_TABLET | Freq: Every day | ORAL | Status: DC
  Administered 2023-10-26: 50 mg via ORAL
  Filled 2023-10-26: qty 1

## 2023-10-26 MED ORDER — METOPROLOL SUCCINATE ER 50 MG PO TB24
50.0000 mg | ORAL_TABLET | Freq: Every day | ORAL | Status: DC
  Administered 2023-10-26 – 2023-10-30 (×5): 50 mg via ORAL
  Filled 2023-10-26 (×5): qty 1

## 2023-10-26 MED ORDER — EZETIMIBE 10 MG PO TABS
10.0000 mg | ORAL_TABLET | Freq: Every day | ORAL | Status: DC
  Administered 2023-10-26 – 2023-10-30 (×5): 10 mg via ORAL
  Filled 2023-10-26 (×5): qty 1

## 2023-10-26 MED ORDER — CEFTRIAXONE SODIUM 1 G IJ SOLR
1.0000 g | Freq: Once | INTRAMUSCULAR | Status: AC
  Administered 2023-10-26: 1 g via INTRAVENOUS
  Filled 2023-10-26: qty 10

## 2023-10-26 MED ORDER — DULOXETINE HCL 60 MG PO CPEP
60.0000 mg | ORAL_CAPSULE | Freq: Every day | ORAL | Status: DC
  Administered 2023-10-26 – 2023-10-30 (×5): 60 mg via ORAL
  Filled 2023-10-26 (×5): qty 1

## 2023-10-26 MED ORDER — POTASSIUM CHLORIDE 10 MEQ/100ML IV SOLN
10.0000 meq | INTRAVENOUS | Status: AC
  Administered 2023-10-26 (×4): 10 meq via INTRAVENOUS
  Filled 2023-10-26 (×4): qty 100

## 2023-10-26 MED ORDER — INSULIN ASPART 100 UNIT/ML IJ SOLN
0.0000 [IU] | Freq: Three times a day (TID) | INTRAMUSCULAR | Status: DC
  Administered 2023-10-26: 15 [IU] via SUBCUTANEOUS
  Administered 2023-10-26: 11 [IU] via SUBCUTANEOUS
  Administered 2023-10-27: 4 [IU] via SUBCUTANEOUS

## 2023-10-26 MED ORDER — HYDROXYZINE HCL 25 MG PO TABS
25.0000 mg | ORAL_TABLET | Freq: Every day | ORAL | Status: DC
  Administered 2023-10-26 – 2023-10-29 (×4): 25 mg via ORAL
  Filled 2023-10-26 (×4): qty 1

## 2023-10-26 MED ORDER — THIAMINE HCL 100 MG/ML IJ SOLN
100.0000 mg | Freq: Every day | INTRAMUSCULAR | Status: DC
  Filled 2023-10-26 (×3): qty 2

## 2023-10-26 MED ORDER — OLANZAPINE 5 MG PO TABS
5.0000 mg | ORAL_TABLET | Freq: Every day | ORAL | Status: DC
Start: 2023-10-26 — End: 2023-10-30
  Administered 2023-10-26 – 2023-10-29 (×4): 5 mg via ORAL
  Filled 2023-10-26 (×4): qty 1

## 2023-10-26 MED ORDER — TAMSULOSIN HCL 0.4 MG PO CAPS
0.4000 mg | ORAL_CAPSULE | Freq: Every day | ORAL | Status: DC
  Administered 2023-10-26 – 2023-10-29 (×4): 0.4 mg via ORAL
  Filled 2023-10-26 (×4): qty 1

## 2023-10-26 NOTE — Evaluation (Signed)
 Clinical/Bedside Swallow Evaluation Patient Details  Name: William Acevedo MRN: [id number] Date of Birth: 12-29-1955  Today's Date: 10/26/2023 Time: SLP Start Time (ACUTE ONLY): 1042 SLP Stop Time (ACUTE ONLY): 1056 SLP Time Calculation (min) (ACUTE ONLY): 14 min  Past Medical History:  Past Medical History:  Diagnosis Date   Carpal tunnel syndrome    Cocaine use    Depression    Diabetes mellitus without complication (HCC)    Endocarditis    ETOH abuse    Hypertensive urgency 04/14/2020   Neuropathy    PTSD (post-traumatic stress disorder)    Stroke (cerebrum) (HCC)    multiple   Zoster    Past Surgical History:  Past Surgical History:  Procedure Laterality Date   BUBBLE STUDY  03/30/2020   Procedure: BUBBLE STUDY;  Surgeon: Jeffrie Oneil JAYSON, MD;  Location: [facility] ENDOSCOPY;  Service: Cardiovascular;;   TEE WITHOUT CARDIOVERSION N/A 03/30/2020   Procedure: TRANSESOPHAGEAL ECHOCARDIOGRAM (TEE);  Surgeon: Jeffrie Oneil JAYSON, MD;  Location: [facility] ENDOSCOPY;  Service: Cardiovascular;  Laterality: N/A;   TESTICLE TORSION REDUCTION     HPI:  68yo male admitted from home 10/25/23 with AMS 2/2 UTI/metabolic encephalopathy. PMH: HTN, HLD, DM2, CVA (05/2023), PTSD, depression, anxiety, prior cocaine and etoh use. CTHead: no acute abnormality. Generalized cerebral atrophy with chronic bilateral cerebellar,  bilateral occipital lobe, left frontal lobe and right parietal lobe infarcts. MRI pending.    Assessment / Plan / Recommendation  Clinical Impression  Pt cooperative with BSE. Awake, alert. Pt has difficulty following commands consistently, due to baseline cognitive-linguistic deficits.  Pt oral cavity appears pink and healthy. Missing dentition. Oral care completed. Pt accepted presentations of ice chips, puree, water, and graham cracker. Oral phase characterized by poor mastication and oral residue of graham cracker. Pt noted to swallow small ice chip whole without oral manipulation. Timely prep  and clearing of puree and thin liquid trials. No overt s/s aspiration across textures. At this time, recommend puree diet with thin liquid, meds crushed in puree. Safe swallow precautions posted at [facility]. ST will follow to assess diet tolerance and determine appropriateness for advanced textures. RN/MD informed of results and recommendations.  SLP Visit Diagnosis: Dysphagia, unspecified (R13.10)    Aspiration Risk  Mild aspiration risk    Diet Recommendation Dysphagia 1 (Puree);Thin liquid    Liquid Administration via: Straw;Cup Medication Administration: Crushed with puree Supervision: Full supervision/cueing for compensatory strategies;Staff to assist with self feeding Compensations: Minimize environmental distractions;Slow rate;Small sips/bites;Other (Comment) (check for oral holding/pocketing) Postural Changes: Seated upright at 90 degrees;Remain upright for at least 30 minutes after po intake    Other  Recommendations Oral Care Recommendations: Oral care BID     Assistance Recommended at Discharge  24 hour supervision  Functional Status Assessment Patient has not had a recent decline in their functional status  Frequency and Duration min 1 x/week  2 weeks       Prognosis Prognosis for improved oropharyngeal function: Fair Barriers to Reach Goals: Cognitive deficits;Severity of deficits      Swallow Study   General Date of Onset: 10/25/23 HPI: 68yo male admitted from home 10/25/23 with AMS 2/2 UTI/metabolic encephalopathy. PMH: HTN, HLD, DM2, CVA (05/2023), PTSD, depression, anxiety, prior cocaine and etoh use. CTHead: no acute abnormality. Generalized cerebral atrophy with chronic bilateral cerebellar,  bilateral occipital lobe, left frontal lobe and right parietal lobe infarcts. MRI pending. Type of Study: Bedside Swallow Evaluation Previous Swallow Assessment: no prior BSE. SLE 02/2022 - severe cognitive deficits Diet  Prior to this Study: NPO Temperature Spikes Noted:  No Respiratory Status: Room air History of Recent Intubation: No Behavior/Cognition: Alert;Cooperative;Doesn't follow directions;Requires cueing;Distractible;Confused Oral Cavity Assessment: Within Functional Limits Oral Care Completed by SLP: Yes Oral Cavity - Dentition: Poor condition;Missing dentition Self-Feeding Abilities: Total assist Patient Positioning: Upright in bed Baseline Vocal Quality: Normal;Low vocal intensity Volitional Cough: Cognitively unable to elicit Volitional Swallow: Unable to elicit    Oral/Motor/Sensory Function Overall Oral Motor/Sensory Function: Generalized oral weakness   Ice Chips Ice chips: Impaired Oral Phase Functional Implications: Other (comment) (Pt swallowed ice chip whole)   Thin Liquid Thin Liquid: Within functional limits Presentation: Straw    Nectar Thick Nectar Thick Liquid: Not tested   Honey Thick Honey Thick Liquid: Not tested   Puree Puree: Within functional limits Presentation: Spoon   Solid     Solid: Impaired Oral Phase Impairments: Impaired mastication;Poor awareness of bolus Oral Phase Functional Implications: Oral residue;Impaired mastication;Prolonged oral transit     Cartier Washko B. Dory, MSP, CCC-SLP Speech Language Pathologist Office: [phone]  Dory Caprice Daring 10/26/2023,11:10 AM

## 2023-10-26 NOTE — TOC Initial Note (Signed)
 Transition of Care [facility]) - Initial/Assessment Note    Patient Details  Name: William Acevedo MRN: [id number] Date of Birth: September 21, 1955  Transition [facility]) CM/SW Contact:    Lendia Dais, LCSWA Phone Number: 10/26/2023, 4:06 PM  Clinical Narrative:  Pt is disoriented x4. CSW spoke to [name] the brother who is also the legal guardian.  [name] states that pt is from home with him and he is dependent with ADL's. [name] states that he does everything for his brother as far as feeding, dressing, bathing, and everything else that he needs. [name] drives the pt's to all his appointments.   Pt has DME of wc, cane, and walker. [name] is the HCPOA and the pt has seen their PCP in the last year. Pt has personal care assistant (PCA) through [facility].   Pt is a veteran and [name] state they haven't been to the [place] hospital in 6-7 months. Pt Has a hx of anxiety and depression. Pt has a hx of substance/alcohol abuse but has not participated in use in the last year and a half.   No TOC needs at this time, please place consult for further needs.   Expected Discharge Plan: Home/Self Care Barriers to Discharge: Continued Medical Work up   Patient Goals and CMS Choice     Choice offered to / list presented to : NA      Expected Discharge Plan and Services In-house Referral: Clinical Social Work     Living arrangements for the past 2 months: Single Family Home                                      Prior Living Arrangements/Services Living arrangements for the past 2 months: Single Family Home Lives with:: Siblings Patient language and need for interpreter reviewed:: Yes        Need for Family Participation in Patient Care: Yes (Comment) Care giver support system in place?: Yes (comment) Current home services: DME Criminal Activity/Legal Involvement Pertinent to Current Situation/Hospitalization: No - Comment as needed  Activities of Daily Living      Permission  Sought/Granted   Permission granted to share information with :  (Unable to assess)  Share Information with NAME: [name]     Permission granted to share info w Relationship: Legal guardian/brother  Permission granted to share info w Contact Information: [phone]  Emotional Assessment Appearance:: Appears stated age Attitude/Demeanor/Rapport: Unable to Assess Affect (typically observed): Unable to Assess Orientation: :  (disoriented x4) Alcohol / Substance Use: Alcohol Use, Illicit Drugs Psych Involvement: No (comment)  Admission diagnosis:  Somnolence [R40.0] Acute cystitis without hematuria [N30.00] AMS (altered mental status) [R41.82] Patient Active Problem List   Diagnosis Date Noted   Hyperglycemia 10/26/2023   Malnutrition of moderate degree 10/26/2023   AMS (altered mental status) 10/25/2023   Chronic health problem 10/25/2023   DKA, type 2 (HCC) 10/25/2023   UTI (urinary tract infection) 10/25/2023   Intractable nausea and vomiting 06/13/2023   Avoid 05/13/2023   Insulin  dependent type 2 diabetes mellitus (HCC) 07/16/2022   Schizoaffective disorder (HCC) 11/10/2021   Alcohol use, unspecified with intoxication, unspecified 11/10/2021   Cocaine abuse with intoxication, unspecified (HCC) 11/10/2021   Antisocial personality disorder (HCC) 11/10/2021   Cannabis dependence, continuous abuse (HCC) 11/10/2021   Opioid abuse (HCC) 11/10/2021   Post-traumatic stress disorder, chronic 11/10/2021   Schizophrenia, unspecified (HCC) 11/10/2021  CVA (cerebral vascular accident) (HCC) 07/09/2020   Acute ischemic multifocal multiple vascular territories stroke (HCC) 06/06/2020   Seizure-like activity (HCC) 06/06/2020   Cocaine abuse (HCC)    ARF (acute renal failure) 04/14/2020   Acute blood loss anemia 04/14/2020   GI bleed 04/14/2020   Nausea & vomiting 04/14/2020   Acute metabolic encephalopathy 04/14/2020   Hypertensive urgency 04/14/2020   Abdominal pain  04/02/2020   Protein-calorie malnutrition, severe 03/27/2020   Cerebral thrombosis with cerebral infarction 03/26/2020   NSTEMI (non-ST elevated myocardial infarction) (HCC) 03/25/2020   Secondary diabetes mellitus with HHNC (hyperglycemia hyperosmolar non-ketotic coma) (HCC) 03/25/2020   Elevated troponin    Renal infarction    Cerebrovascular accident (CVA) (HCC)    Cocaine use disorder, severe, dependence (HCC) 04/08/2019   Major depressive disorder 04/08/2019   Epiglottitis 04/07/2019   Cocaine use 04/07/2019   Acute encephalopathy 04/07/2019   Overdose 07/06/2015   AKI (acute kidney injury) 07/06/2015   Substance abuse (HCC) 07/06/2015   DM2 (diabetes mellitus, type 2) (HCC) 07/06/2015   Dehydration    MDD (major depressive disorder), recurrent episode, mild (HCC)    Alcohol use disorder    PCP:  Campbell Reynolds, NP Pharmacy:   [facility] - [place], [place] - [address] [place] [address] Phone: [phone] Fax: [phone]  [facility] - [place], [place] - [address] [place] [address] Phone: [phone] Fax: [phone]     Social Drivers of Health (SDOH) Social History: SDOH Screenings   Food Insecurity: Patient Unable To Answer (10/26/2023)  Housing: Patient Unable To Answer (10/26/2023)  Transportation Needs: Patient Unable To Answer (10/26/2023)  Utilities: Patient Unable To Answer (10/26/2023)  Social Connections: Patient Unable To Answer (10/26/2023)  Tobacco Use: High Risk (09/04/2023)   SDOH Interventions:     Readmission Risk Interventions     No data to display

## 2023-10-26 NOTE — TOC CAGE-AID Note (Signed)
 Transition of Care [facility]) - CAGE-AID Screening   Patient Details  Name: William Acevedo MRN: [id number] Date of Birth: 03-13-1955  Transition of Care [facility]) CM/SW Contact:    Lendia Dais, LCSWA Phone Number: 10/26/2023, 3:56 PM   Clinical Narrative: Patient is disoriented x4 and is unable to participate in CAGE-AID assessment.     CAGE-AID Screening: Substance Abuse Screening unable to be completed due to: : Patient unable to participate (Patient is disoriented x4)  Have You Ever Felt You Ought to Cut Down on Your Drinking or Drug Use?:  (Unable to assess) Have People Annoyed You By Critizing Your Drinking Or Drug Use?:  (Unable to assess) Have You Felt Bad Or Guilty About Your Drinking Or Drug Use?:  (Unable to assess) Have You Ever Had a Drink or Used Drugs First Thing In The Morning to Steady Your Nerves or to Get Rid of a Hangover?:  (Unable to assess)       Substance abuse interventions: Other (must comment) (Unable to assess)

## 2023-10-26 NOTE — Progress Notes (Addendum)
 Initial Nutrition Assessment  DOCUMENTATION CODES:   Non-severe (moderate) malnutrition in context of chronic illness  INTERVENTION:  Recommend DYS 1 diet,  texture per SLP  Order Glucerna Shake po TID (220 kcal and 10 grams of protein per bottle) Continue CIWA supplementation, MVI w/minerals, thiamine , folic acid  Pt at risk for refeeding syndrome, order Phos/Mg x 3 day and replete as needed    NUTRITION DIAGNOSIS:   Moderate Malnutrition related to chronic illness as evidenced by mild fat depletion, moderate muscle depletion, percent weight loss.   GOAL:   Patient will meet greater than or equal to 90% of their needs   MONITOR:   PO intake, Supplement acceptance, Labs  REASON FOR ASSESSMENT:   Consult Assessment of nutrition requirement/status  ASSESSMENT:   PMHx HTN, HLD, T2DM, h/o stroke, PTSD, depression, and h/o EtOH and cocaine use presenting with AMS.  Patient seen in room. Currently disoriented x4, only answering alright when asked any questions. Chart reviewed. 7.5% weight loss x 3 months in chart history, clinically significant. Mild fat loss and moderate muscle wasting noted on NFPE, pt meets ASPEN criteria for malnutrition. Pt is NPO due to DKA. D5LR @ 125 ml/hr ordered with SSI 0-20 units q 4 hrs as pt's current unit is unable to run an insulin  drip.  A1c 11.0 on admission, up from 8.6 this past April. Seen by speech after visit, started on DYS 1 diet with thin liquids.   Labs:  Glucose 150  BUN 30 Cr 1.29  CBG 150-308 A1c 11.0   Meds: MVI, thiamine , folic acid , SSI 0-20 units q 4 hrs   Infusions: D5LR @ 125 ml/hr   Wt Readings from Last 10 Encounters: 10/25/23 56.6 kg 09/04/23 63.5 kg 07/12/23 61 kg 06/25/23 61 kg 06/22/23 61 kg 06/12/23 61 kg 06/05/23 60.8 kg 05/20/23 61.2 kg 04/17/23 61.2 kg 07/17/22 52.3 kg  NUTRITION - FOCUSED PHYSICAL EXAM:  Flowsheet Row Most Recent Value  Orbital Region No depletion  Upper Arm Region Mild  depletion  Thoracic and Lumbar Region Mild depletion  Buccal Region No depletion  Temple Region No depletion  Clavicle Bone Region Mild depletion  Clavicle and Acromion Bone Region Moderate depletion  Scapular Bone Region Mild depletion  Dorsal Hand No depletion  Patellar Region Moderate depletion  Anterior Thigh Region Moderate depletion  Posterior Calf Region Moderate depletion  Hair Reviewed  Eyes Unable to assess  [pt sleeping]  Mouth Unable to assess  [pt sleeping]  Skin Reviewed  Nails Reviewed    Diet Order:   Diet Order             DIET - DYS 1 Room service appropriate? Yes; Fluid consistency: Thin  Diet effective now                   EDUCATION NEEDS:   Not appropriate for education at this time  Skin:  Skin Assessment: Reviewed RN Assessment  Last BM:  PTA  Height:   Ht Readings from Last 1 Encounters:  09/04/23 5' 5 (1.651 m)    Weight:   Wt Readings from Last 1 Encounters:  10/25/23 56.6 kg    BMI:  Body mass index is 20.76 kg/m.  Estimated Nutritional Needs:   Kcal:  1400-1700 kcals  Protein:  57-68 gm  Fluid:  1.7L/d  Maebell Lyvers, MS, RD, LDN Clinical Dietitian  Contact via secure chat. If unavailable, use group chat RD Inpatient.

## 2023-10-26 NOTE — Assessment & Plan Note (Addendum)
 Based on pH within normal limits, do not suspect this is a true DKA picture.  However, anion gap continues to be elevated and he did have elevated glucose and urine ketones.  Will treat hyperglycemia. - Change Resistant SSI and CBG to ACHS - Discontinue IVF as now he passed a swallow eval

## 2023-10-26 NOTE — Assessment & Plan Note (Deleted)
 UDS negative.  Reported history of substance use.  - ***

## 2023-10-26 NOTE — Progress Notes (Signed)
 Daily Progress Note Intern Pager: [phone]  Patient name: William Acevedo Medical record number: [id number] Date of birth: [birth year] Age: 68 y.o. Gender: male  Primary Care Provider: Campbell Reynolds, NP Consultants: none Code Status: Full  Pt Overview and Major Events to Date:  10/8 - admitted  Assessment and Plan:  William Acevedo is a 68 y.o. male with PMHx HTN, HLD, T2DM, h/o stroke, PTSD, depression, and h/o EtOH and cocaine use presenting with AMS which continues to be unclear whether from DKA with elevated glucose, urine ketones, and anion gap, however pH wnl, stroke with history of CVA, and UTI based on UA results but previous chronic indwelling Foley. Assessment & Plan AMS (altered mental status) UTI (urinary tract infection) Unclear etiology of his altered mental status.  Per brother who provides collateral reports he was eating slower than normal, looking away, and slumping in the chair when he normally sits up.  He is a hospice patient, but his brother would like further workup of these changes in the hospital.  - awaiting MRI brain to r/o acute stroke - telemetry - SLP eval with recommendations for dysphagia 1 diet DKA, type 2 (HCC) Hyperglycemia Based on pH within normal limits, do not suspect this is a true DKA picture.  However, anion gap continues to be elevated and he did have elevated glucose and urine ketones.  Will treat hyperglycemia. - Change Resistant SSI and CBG to ACHS - Discontinue IVF as now he passed a swallow eval AKI (acute kidney injury) Cr 1.65 > improved to 1.29 with fluid resuscitation. Bladder scan volume 45 so do not suspect retention with associated nursing reports of good urine output despite history of retention and chronic indwelling catheter. - strict I/Os - AM BMP Chronic health problem HTN - holding home Amlodipine  5 mg daily for stable BP, restart Toprol -XL 50 mg daily HLD - can restart home Atorvastatin  80 mg daily, Zetia  10  mg daily T2DM - holding home Farxiga  10 mg daily, Metformin  500 mg BID, Lantus  20 units daily, Humalog  4 units TID with meals; management per above H/o of stroke - can restart ASA 81 mg daily, Clopidogrel  70 mg daily PTSD/Depression - will restart Cymbalta  60 mg daily, Olanzapine  5mg  daily at bedtime, Trazodone  daily at bedtime, Hydroxyzine  25 mg daily at bedtime GERD - restart Pepcid  20 mg daily BPH - restart Flomax  0.4mg  daily at bedtime Malnutrition - continue Folic acid  1 mg daily, Thiamine  100mg  daily   FEN/GI: dys 1 PPx: lovenox  Dispo:Pending PT recommendations  pending clinical improvement .   Subjective:  Patient does not respond to any questions, but does open his eyes  Objective: Temp:  [97.5 F (36.4 C)-98.6 F (37 C)] 97.6 F (36.4 C) (10/09 0406) Pulse Rate:  [72-79] 74 (10/09 0406) Resp:  [10-20] 16 (10/09 0406) BP: (102-180)/(70-128) 144/72 (10/09 0406) SpO2:  [99 %-100 %] 100 % (10/09 0406) Weight:  [56.6 kg] 56.6 kg (10/08 2113)  Physical Exam: General: Alert male lying on his left side curled up in bed Cardiovascular: Regular rate and rhythm, 2+ radial pulse Respiratory: Lungs clear to auscultation bilaterally, no increased work of breathing Abdomen: Soft, nondistended, no grimacing or signs of pain with palpation Neuro: Opens eyes to voice, does not respond to any questions, does not follow any commands  Laboratory: Most recent CBC Lab Results  Component Value Date   WBC 6.2 10/26/2023   HGB 12.7 (L) 10/26/2023   HCT 41.5 10/26/2023   MCV 87.0 10/26/2023  PLT 286 10/26/2023   Most recent BMP    Latest Ref Rng & Units 10/26/2023    4:30 AM  BMP  Glucose 70 - 99 mg/dL 849   BUN 8 - 23 mg/dL 30   Creatinine 9.38 - 1.24 mg/dL 8.70   Sodium 864 - 854 mmol/L 145   Potassium 3.5 - 5.1 mmol/L 4.5   Chloride 98 - 111 mmol/L 106   CO2 22 - 32 mmol/L 21   Calcium  8.9 - 10.3 mg/dL 9.3    Imaging/Diagnostic Tests: None  Alena Morrison, Elio,  MD 10/26/2023, 7:24 AM  PGY-1, [facility] FPTS Intern pager: [phone], text pages welcome Secure chat group [facility] Teaching Service

## 2023-10-26 NOTE — Assessment & Plan Note (Addendum)
 Unclear etiology of his altered mental status.  Per brother who provides collateral reports he was eating slower than normal, looking away, and slumping in the chair when he normally sits up.  He is a hospice patient, but his brother would like further workup of these changes in the hospital.  - awaiting MRI brain to r/o acute stroke - telemetry - SLP eval with recommendations for dysphagia 1 diet

## 2023-10-26 NOTE — Assessment & Plan Note (Addendum)
 Cr 1.65 > improved to 1.29 with fluid resuscitation. Bladder scan volume 45 so do not suspect retention with associated nursing reports of good urine output despite history of retention and chronic indwelling catheter. - strict I/Os - AM BMP

## 2023-10-26 NOTE — Assessment & Plan Note (Addendum)
 HTN - holding home Amlodipine  5 mg daily for stable BP, restart Toprol -XL 50 mg daily HLD - can restart home Atorvastatin  80 mg daily, Zetia  10 mg daily T2DM - holding home Farxiga  10 mg daily, Metformin  500 mg BID, Lantus  20 units daily, Humalog  4 units TID with meals; management per above H/o of stroke - can restart ASA 81 mg daily, Clopidogrel  70 mg daily PTSD/Depression - will restart Cymbalta  60 mg daily, Olanzapine  5mg  daily at bedtime, Trazodone  daily at bedtime, Hydroxyzine  25 mg daily at bedtime GERD - restart Pepcid  20 mg daily BPH - restart Flomax  0.4mg  daily at bedtime Malnutrition - continue Folic acid  1 mg daily, Thiamine  100mg  daily

## 2023-10-27 ENCOUNTER — Inpatient Hospital Stay (HOSPITAL_COMMUNITY)

## 2023-10-27 DIAGNOSIS — I639 Cerebral infarction, unspecified: Secondary | ICD-10-CM

## 2023-10-27 DIAGNOSIS — I63511 Cerebral infarction due to unspecified occlusion or stenosis of right middle cerebral artery: Secondary | ICD-10-CM

## 2023-10-27 LAB — BASIC METABOLIC PANEL WITH GFR
Anion gap: 12 (ref 5–15)
BUN: 25 mg/dL — ABNORMAL HIGH (ref 8–23)
CO2: 23 mmol/L (ref 22–32)
Calcium: 9 mg/dL (ref 8.9–10.3)
Chloride: 110 mmol/L (ref 98–111)
Creatinine, Ser: 0.99 mg/dL (ref 0.61–1.24)
GFR, Estimated: >60 mL/min (ref >60)
Glucose, Bld: 112 mg/dL — ABNORMAL HIGH (ref 70–99)
Potassium: 4.1 mmol/L (ref 3.5–5.1)
Sodium: 145 mmol/L (ref 135–145)

## 2023-10-27 LAB — GLUCOSE, CAPILLARY
Glucose-Capillary: 188 mg/dL — ABNORMAL HIGH (ref 70–99)
Glucose-Capillary: 363 mg/dL — ABNORMAL HIGH (ref 70–99)
Glucose-Capillary: 249 mg/dL — ABNORMAL HIGH (ref 70–99)
Glucose-Capillary: 247 mg/dL — ABNORMAL HIGH (ref 70–99)

## 2023-10-27 LAB — PHOSPHORUS: Phosphorus: 3.4 mg/dL (ref 2.5–4.6)

## 2023-10-27 LAB — MAGNESIUM: Magnesium: 2.2 mg/dL (ref 1.7–2.4)

## 2023-10-27 MED ORDER — IOHEXOL 350 MG/ML SOLN
75.0000 mL | Freq: Once | INTRAVENOUS | Status: DC | PRN
  Administered 2023-10-27: 75 mL via INTRAVENOUS

## 2023-10-27 MED ORDER — INSULIN ASPART 100 UNIT/ML IJ SOLN: 0.0000 [IU] | Freq: Three times a day (TID) | INTRAMUSCULAR | Status: DC

## 2023-10-27 MED ORDER — INSULIN GLARGINE 100 UNIT/ML ~~LOC~~ SOLN
10.0000 [IU] | Freq: Every day | SUBCUTANEOUS | Status: DC
Start: 2023-10-27 — End: 2023-10-27

## 2023-10-27 MED ORDER — CLOPIDOGREL BISULFATE 75 MG PO TABS
225.0000 mg | ORAL_TABLET | Freq: Once | ORAL | Status: AC
  Administered 2023-10-27: 225 mg via ORAL
  Filled 2023-10-27: qty 3

## 2023-10-27 MED ORDER — INSULIN GLARGINE 100 UNIT/ML ~~LOC~~ SOLN
10.0000 [IU] | Freq: Every day | SUBCUTANEOUS | Status: DC
  Filled 2023-10-27: qty 0.1

## 2023-10-27 MED ORDER — LORAZEPAM 2 MG/ML IJ SOLN
1.0000 mg | Freq: Once | INTRAMUSCULAR | Status: AC
  Administered 2023-10-27: 1 mg via INTRAVENOUS
  Filled 2023-10-27: qty 0.5

## 2023-10-27 MED ORDER — INSULIN ASPART 100 UNIT/ML IJ SOLN
0.0000 [IU] | Freq: Every day | INTRAMUSCULAR | Status: DC
  Administered 2023-10-27: 2 [IU] via SUBCUTANEOUS
  Administered 2023-10-28: 5 [IU] via SUBCUTANEOUS
  Administered 2023-10-29: 3 [IU] via SUBCUTANEOUS

## 2023-10-27 MED ORDER — ASPIRIN 81 MG PO TBEC
243.0000 mg | DELAYED_RELEASE_TABLET | Freq: Once | ORAL | Status: AC
  Administered 2023-10-27: 243 mg via ORAL
  Filled 2023-10-27: qty 3

## 2023-10-27 NOTE — Assessment & Plan Note (Signed)
 HTN - holding home Amlodipine  5 mg daily for stable BP, continue Toprol -XL 50 mg daily HLD - continue home Atorvastatin  80 mg daily, Zetia  10 mg daily T2DM - holding home Farxiga  10 mg daily, Metformin  500 mg BID, Lantus  20 units daily, Humalog  4 units TID with meals; management per above H/o of stroke - continue ASA 81 mg daily, Clopidogrel  70 mg daily PTSD/Depression - continue Cymbalta  60 mg daily, Olanzapine  5mg  daily at bedtime, Hydroxyzine  25 mg daily at bedtime; Trazodone  daily at bedtime discontinued 10/10  GERD - continue Pepcid  20 mg daily BPH - continue Flomax  0.4mg  daily at bedtime Malnutrition - continue Folic acid  1 mg daily, Thiamine  100mg  daily

## 2023-10-27 NOTE — Assessment & Plan Note (Addendum)
 MRI with evidence of new acute stroke. Multiple areas of infarction within the right MCA territory is concerning for embolic as opposed to thrombotic stroke. - Neurology consulted, appreciate recommendations - Neuro checks q4hrs - CTA head w/wo contrast ordered - AM lipid panel, pending - Ordered echocardiogram, still needs to be completed - Consult palliative care - Continue ASA, plavix , statin

## 2023-10-27 NOTE — Plan of Care (Signed)
  Problem: Fluid Volume: Goal: Ability to maintain a balanced intake and output will improve Outcome: Progressing   Problem: Metabolic: Goal: Ability to maintain appropriate glucose levels will improve Outcome: Progressing   Problem: Nutritional: Goal: Maintenance of adequate nutrition will improve Outcome: Progressing   Problem: Skin Integrity: Goal: Risk for impaired skin integrity will decrease Outcome: Progressing   Problem: Metabolic: Goal: Ability to maintain appropriate glucose levels will improve Outcome: Progressing

## 2023-10-27 NOTE — Consult Note (Signed)
 NEUROLOGY CONSULT NOTE   Date of service: October 27, 2023 Patient Name: William Acevedo MRN:  [id number] DOB:  1955-11-26 Chief Complaint: Stroke on MRI Requesting Provider: Delores Suzann HERO, MD  History of Present Illness  William Acevedo is a 68 y.o. male with hx of HTN, HLD, T2DM, h/o stroke, PTSD, depression, and h/o EtOH and cocaine use presenting with AMS. He was admitted on 10/25/2023 for altered mental status and found to be in DKA. He has an extensive stroke history and MRI done for continued encephalopathy shows for scattered foci of acute infarction throughout the right MCA territory. Despite him being on hospice family would like to pursue a full stroke work up. Palliative care has also been consulted for assistance in goals of care conversation   From previous stroke work up in Feb 2024: Acute Left ACA Ischemic Stroke Etiology:  atheroembolic vs cardioembolic vs secondary to cocaine-related vasculopathy CT Extensive, multifocal bilateral encephalomalacia, some of which appears to be new or at least increased compared to prior examination dated 07/09/2020 although is generally age indeterminate. CTA head & neck:Left A2 segment occlusion, corresponding to the infarct seen on concomitant brain MRI. Severe stenosis of the proximal cavernous segment of the left ICA. MRI with acute to early subacute infarct in the medial left frontal lobe, correlating with the left ACA territory and ACA-PCA watershed. No evidence of hemorrhagic transformation or significant mass effect. Remote infarcts involving the bilateral occipital cortex, right parietal cortex, and posterior left frontal lobe white matter. 2D Echo: LVEF 55-60%. Grade I diastolic dysfunction, elevated LV end-diastolic pressure.  Abnormal Mitral Valve, Trivial MVR, Severe Mitral annular calcification Tricuspid Aortic Valve, Moderate Calcification, Moderate Thickening      ROS  Comprehensive ROS performed and pertinent positives  documented in HPI   Past History   Past Medical History:  Diagnosis Date   Carpal tunnel syndrome    Cocaine use    Depression    Diabetes mellitus without complication (HCC)    Endocarditis    ETOH abuse    Hypertensive urgency 04/14/2020   Neuropathy    PTSD (post-traumatic stress disorder)    Stroke (cerebrum) (HCC)    multiple   Zoster     Past Surgical History:  Procedure Laterality Date   BUBBLE STUDY  03/30/2020   Procedure: BUBBLE STUDY;  Surgeon: Jeffrie Oneil JAYSON, MD;  Location: [facility] ENDOSCOPY;  Service: Cardiovascular;;   TEE WITHOUT CARDIOVERSION N/A 03/30/2020   Procedure: TRANSESOPHAGEAL ECHOCARDIOGRAM (TEE);  Surgeon: Jeffrie Oneil JAYSON, MD;  Location: [facility] ENDOSCOPY;  Service: Cardiovascular;  Laterality: N/A;   TESTICLE TORSION REDUCTION      Family History: No family history on file.  Social History  reports that he has been smoking cigarettes. He has never used smokeless tobacco. He reports current alcohol use of about 7.0 standard drinks of alcohol per week. He reports that he does not currently use drugs after having used the following drugs: Cocaine.  Allergies  Allergen Reactions   Prilosec [Omeprazole] Swelling    Angioedema of lips, tongue    Zestril [Lisinopril] Swelling    Angioedema of lips     Medications   Current Facility-Administered Medications:    aspirin  EC tablet 81 mg, 81 mg, Oral, Daily, Treadwell Smucker, [place], MD, 81 mg at 10/27/23 0817   atorvastatin  (LIPITOR ) tablet 80 mg, 80 mg, Oral, Daily, Treadwell Smucker, [place], MD, 80 mg at 10/27/23 0817   clopidogrel  (PLAVIX ) tablet 75 mg, 75 mg, Oral, Daily, [employer], [place],  MD, 75 mg at 10/27/23 0817   dextrose  50 % solution 0-50 mL, 0-50 mL, Intravenous, PRN, Cleotilde Perkins, DO   DULoxetine  (CYMBALTA ) DR capsule 60 mg, 60 mg, Oral, Daily, Treadwell Smucker, [place], MD, 60 mg at 10/27/23 9183   enoxaparin  (LOVENOX ) injection 40 mg, 40 mg, Subcutaneous, Q24H, Cleotilde Perkins, DO, 40  mg at 10/26/23 2002   ezetimibe  (ZETIA ) tablet 10 mg, 10 mg, Oral, Daily, Treadwell Smucker, [place], MD, 10 mg at 10/27/23 9182   famotidine  (PEPCID ) tablet 20 mg, 20 mg, Oral, QAC breakfast, Treadwell Smucker, [place], MD, 20 mg at 10/27/23 0816   feeding supplement (GLUCERNA SHAKE) (GLUCERNA SHAKE) liquid 237 mL, 237 mL, Oral, TID BM, Delores Suzann HERO, MD, 237 mL at 10/27/23 0949   folic acid  (FOLVITE ) tablet 1 mg, 1 mg, Oral, Daily, Theophilus Pagan, MD, 1 mg at 10/27/23 9183   hydrOXYzine  (ATARAX ) tablet 25 mg, 25 mg, Oral, QHS, Treadwell Smucker, [place], MD, 25 mg at 10/26/23 2302   [START ON 10/28/2023] insulin  aspart (novoLOG ) injection 0-20 Units, 0-20 Units, Subcutaneous, TID WC, Alena Morrison, [place], MD   NOREEN ON 10/28/2023] insulin  glargine (LANTUS ) injection 10 Units, 10 Units, Subcutaneous, Daily, Alena Morrison, [place], MD   metoprolol  succinate (TOPROL -XL) 24 hr tablet 50 mg, 50 mg, Oral, Daily, Treadwell Smucker, [place], MD, 50 mg at 10/27/23 9183   multivitamin with minerals tablet 1 tablet, 1 tablet, Oral, Daily, Theophilus Pagan, MD, 1 tablet at 10/27/23 9182   OLANZapine  (ZYPREXA ) tablet 5 mg, 5 mg, Oral, QHS, Treadwell Smucker, [place], MD, 5 mg at 10/26/23 2303   tamsulosin  (FLOMAX ) capsule 0.4 mg, 0.4 mg, Oral, QHS, Alena Morrison, [place], MD, 0.4 mg at 10/26/23 2302   thiamine  (VITAMIN B1) tablet 100 mg, 100 mg, Oral, Daily, 100 mg at 10/27/23 0816 **OR** thiamine  (VITAMIN B1) injection 100 mg, 100 mg, Intravenous, Daily, Theophilus Pagan, MD  Vitals   Vitals:   10/27/23 0426 10/27/23 0500 10/27/23 0744 10/27/23 1645  BP: (!) 148/74  (!) 154/86 (!) 148/85  Pulse: 65  72 78  Resp: 18  18 16   Temp: 98.2 F (36.8 C)  98 F (36.7 C) 98.6 F (37 C)  TempSrc: Oral  Oral Oral  SpO2: 99%  100% 100%  Weight:  57.7 kg      Body mass index is 21.17 kg/m.   Physical Exam   Constitutional: cachectic, NAD  Psych: easily agitated Eyes: No scleral injection.   HENT: No OP obstruction.  Head: Normocephalic.  Cardiovascular: Normal rate and regular rhythm.  Respiratory: Effort normal, non-labored breathing.  GI: Soft.  No distension.  Skin: WDI.   Neurologic Examination    Neuro: Mental Status: Patient is awake, alert, oriented to name No other verbal output Cranial Nerves: II: does not consistently blink to threat but tracks bilaterally III,IV, VI: EOMI  V: Facial sensation is symmetric to temperature VII: Facial movement is symmetric resting and smiling VIII: Hearing is intact to voice X: Palate elevates symmetrically XI: Shoulder shrug is symmetric. XII: Tongue protrudes midline without atrophy or fasciculations.  Motor: Tone is slightly increased and resists passive ROM. Bulk is poor Maintains antigravity strength and is strong with confrontational movement Sensory: Localizes in all  Cerebellar: Unable to participate   Labs/Imaging/Neurodiagnostic studies   CBC:  Recent Labs  Lab November 14, 2023 1424 10/26/23 0430  WBC 6.5 6.2  NEUTROABS 4.4  --   HGB 12.6* 12.7*  HCT 41.4 41.5  MCV 88.8 87.0  PLT 280 286   Basic Metabolic  Panel:  Lab Results  Component Value Date   NA 145 10/27/2023   K 4.1 10/27/2023   CO2 23 10/27/2023   GLUCOSE 112 (H) 10/27/2023   BUN 25 (H) 10/27/2023   CREATININE 0.99 10/27/2023   CALCIUM  9.0 10/27/2023   GFRNONAA >60 10/27/2023   GFRAA >60 09/13/2019   Lipid Panel:  Lab Results  Component Value Date   LDLCALC 270 (H) 07/17/2022   HgbA1c:  Lab Results  Component Value Date   HGBA1C 11.0 (H) 10/26/2023   Urine Drug Screen:     Component Value Date/Time   LABOPIA NONE DETECTED 10/25/2023 1740   COCAINSCRNUR NONE DETECTED 10/25/2023 1740   LABBENZ NONE DETECTED 10/25/2023 1740   AMPHETMU NONE DETECTED 10/25/2023 1740   THCU NONE DETECTED 10/25/2023 1740   LABBARB NONE DETECTED 10/25/2023 1740    Alcohol Level     Component Value Date/Time   ETH <15 10/25/2023 1430   INR   Lab Results  Component Value Date   INR 1.0 01/02/2023   APTT  Lab Results  Component Value Date   APTT 23 (L) 01/02/2023   CT Head without contrast(Personally reviewed): Generalized cerebral atrophy with chronic bilateral cerebellar bilateral occipital lobe, left frontal lobe and right parietal lobe infarcts.  CT angio Head and Neck with contrast(Personally reviewed): Pending   MRI Brain(Personally reviewed): Scattered foci of acute infarction throughout the right MCA territory, including the right insula, right anterior corona radiata, and right centrum semiovale. No acute hemorrhage or significant mass effect.  ASSESSMENT   William Acevedo is a 68 y.o. male who was initially admitted with with AMS and DKA who is also on home hospice. MRI brain positive for right MCA territory infarct. He is now on aspirin  81mg  and plavix  75mg . Echocardiogram and CTA head and Neck pending to complete stroke work up .   RECOMMENDATIONS  - HgbA1c, fasting lipid panel - CTA Head and Neck  - Frequent neuro checks - Echocardiogram - Risk factor modification - Telemetry monitoring - PT consult, OT consult, Speech consult - Stroke team to follow  ______________________________________________________________________  Signed, Jorene Last, NP [facility]  NEUROHOSPITALIST ADDENDUM Performed a face to face diagnostic evaluation.   I have reviewed the contents of history and physical exam as documented by PA/ARNP/Resident and agree with above documentation.  I have discussed and formulated the above plan as documented. Edits to the note have been made as needed.   Saahas Hidrogo, MD [facility] [phone]   If 7pm to 7am, please call on call as listed on AMION.

## 2023-10-27 NOTE — Progress Notes (Addendum)
 Daily Progress Note Intern Pager: [phone]  Patient name: William Acevedo Medical record number: [id number] Date of birth: February 05, 1955 Age: 68 y.o. Gender: male  Primary Care Provider: Campbell Reynolds, NP Consultants: none Code Status: full  Pt Overview and Major Events to Date:  10/8 - admitted   Assessment and Plan:  William Acevedo is a 68 y.o. male with PMHx HTN, HLD, T2DM, h/o stroke, PTSD, depression, substance use disorder brought in as he was not as lively as his normal self likely secondary to new acute infarctions in the right MCA territory, including the right insula, right anterior corona radiata, and right centrum Semiovale as evidenced by MRI this morning. Assessment & Plan AMS (altered mental status) MRI with evidence of new acute stroke.  Multiple areas of infarction within the right MCA territory is concerning for embolic as opposed to thrombotic stroke.  Called Mr. Tagle brother, who is his legal guardian, and we will proceed with further evaluation of the etiology of his stroke.  He has been on DAPT and statin therapy prior to and continued during admission for many prior strokes. Patient is on hospice outpatient, but it is his guardian emphasizes that he would like full evaluation. - AM lipid panel - ordered echocardiogram  - consult palliative care - continue ASA, plavix , statin Hyperglycemia Glucose ranged from 112-337.  Received 29 units of aspart yesterday.  Do not suspect he will eat much today with his decreased alertness compared to yesterday.  - change to moderate SSI ACHS - will hold off on basal insulin  at this time (restart at 5 units and reduce sliding scale pending intake)  Malnutrition of moderate degree RD assessed patient with recommendations for Glucerna shakes 3 times a day. - A.m. Mg, Phos for 2 more days AKI (acute kidney injury) (Resolved: 10/27/2023) Creatinine baseline appears to be around 1.0.  Initial elevation to 1.64 on  admission but now back to baseline. -AM BMP Chronic health problem HTN - holding home Amlodipine  5 mg daily for stable BP, continue Toprol -XL 50 mg daily HLD - continue home Atorvastatin  80 mg daily, Zetia  10 mg daily T2DM - holding home Farxiga  10 mg daily, Metformin  500 mg BID, Lantus  20 units daily, Humalog  4 units TID with meals; management per above H/o of stroke - continue ASA 81 mg daily, Clopidogrel  70 mg daily PTSD/Depression - continue Cymbalta  60 mg daily, Olanzapine  5mg  daily at bedtime, Trazodone  daily at bedtime, Hydroxyzine  25 mg daily at bedtime GERD - continue Pepcid  20 mg daily BPH - continue Flomax  0.4mg  daily at bedtime Malnutrition - continue Folic acid  1 mg daily, Thiamine  100mg  daily   FEN/GI: dys 1 PPx: lovenox  Dispo:Home with guardian/brother and established HH care pending clinical improvement .    Subjective:  Patient does not respond to questions this morning  Objective: Temp:  [97.7 F (36.5 C)-98.3 F (36.8 C)] 98.2 F (36.8 C) (10/10 0426) Pulse Rate:  [65-78] 65 (10/10 0426) Resp:  [18] 18 (10/10 0426) BP: (128-150)/(57-74) 148/74 (10/10 0426) SpO2:  [93 %-99 %] 99 % (10/10 0426) Weight:  [57.7 kg] 57.7 kg (10/10 0500)   Intake/Output Summary (Last 24 hours) at 10/27/2023 0800 Last data filed at 10/27/2023 0427 Gross per 24 hour  Intake 100 ml  Output 1800 ml  Net -1700 ml    Physical Exam: General: Opens eyes to voice, grimaces with sternal rub, voluntarily raises both arms Neuro: Brisk but symmetric patellar and bicipital reflexes, no clonus, flexor posturing of upper  and lower extremities intermittently CV: Regular rate and rhythm Respiratory: Clear to auscultation bilaterally  Laboratory: Most recent CBC Lab Results  Component Value Date   WBC 6.2 10/26/2023   HGB 12.7 (L) 10/26/2023   HCT 41.5 10/26/2023   MCV 87.0 10/26/2023   PLT 286 10/26/2023   Most recent BMP    Latest Ref Rng & Units 10/27/2023    3:40 AM  BMP   Glucose 70 - 99 mg/dL 887   BUN 8 - 23 mg/dL 25   Creatinine 9.38 - 1.24 mg/dL 9.00   Sodium 864 - 854 mmol/L 145   Potassium 3.5 - 5.1 mmol/L 4.1   Chloride 98 - 111 mmol/L 110   CO2 22 - 32 mmol/L 23   Calcium  8.9 - 10.3 mg/dL 9.0    Phos 3.4 Mg 2.2  Imaging/Diagnostic Tests:  MRI head 10/27/23 IMPRESSION: 1. Scattered foci of acute infarction throughout the right MCA territory, including the right insula, right anterior corona radiata, and right centrum semiovale. No acute hemorrhage or significant mass effect. 2. Image quality is degraded by motion artifact.    Alena Morrison, Elio, MD 10/27/2023, 7:43 AM  PGY-1, [facility] FPTS Intern pager: [phone], text pages welcome Secure chat group [facility] Teaching Service

## 2023-10-27 NOTE — Progress Notes (Addendum)
 Daily Progress Note Intern Pager: [phone]  Patient name: William Acevedo Medical record number: [id number] Date of birth: June 29, 1955 Age: 68 y.o. Gender: male  Primary Care Provider: Campbell Reynolds, NP Consultants: Neurology Code Status: Full code  Pt Overview and Major Events to Date:  10/8 - admitted  Assessment and Plan:  William Acevedo is a 68 y.o. male with PMHx HTN, HLD, T2DM, h/o stroke, PTSD, depression, substance use disorder brought in as he was not as lively as his normal self likely 2/2 new acute infarctions in the right MCA territory, including the right insula, right anterior corona radiata, and right centrum semiovale as evidenced by MRI this morning. Improving mental status. Patient may be stable for discharge today pending echo and CTA.  Assessment & Plan AMS (altered mental status) Acute stroke due to ischemia [facility]) MRI with evidence of new acute stroke. Multiple areas of infarction within the right MCA territory is concerning for embolic as opposed to thrombotic stroke. - Neurology consulted, appreciate recommendations - Neuro checks q4hrs - CTA head w/wo contrast ordered - AM lipid panel, pending - Ordered echocardiogram, still needs to be completed - Consult palliative care - Continue ASA, plavix , statin Hyperglycemia Glucose ranged from 112-337.  Received 6 units of aspart yesterday. Took some PO yesterday.  - Change to moderate SSI ACHS - Lantus  5 units daily  Malnutrition of moderate degree RD assessed patient with recommendations for Glucerna shakes 3 times a day. - AM Mg, Phos, pending Chronic health problem HTN - holding home Amlodipine  5 mg daily for stable BP, continue Toprol -XL 50 mg daily HLD - continue home Atorvastatin  80 mg daily, Zetia  10 mg daily T2DM - holding home Farxiga  10 mg daily, Metformin  500 mg BID, Lantus  20 units daily, Humalog  4 units TID with meals; management per above H/o of stroke - continue ASA 81 mg daily,  Clopidogrel  70 mg daily PTSD/Depression - continue Cymbalta  60 mg daily, Olanzapine  5mg  daily at bedtime, Hydroxyzine  25 mg daily at bedtime; Trazodone  daily at bedtime discontinued 10/10  GERD - continue Pepcid  20 mg daily BPH - continue Flomax  0.4mg  daily at bedtime Malnutrition - continue Folic acid  1 mg daily, Thiamine  100mg  daily   FEN/GI: Dys 1 PPx: Lovenox  Dispo:Home to guardian/brother with home health pending clinical improvement . Potential discharge today.  Subjective:  Patient doing well this morning. Easily awoke to exam, responding to questions.  Objective: Temp:  [98 F (36.7 C)-98.6 F (37 C)] 98 F (36.7 C) (10/10 2127) Pulse Rate:  [65-78] 68 (10/10 2127) Resp:  [13-18] 13 (10/10 2127) BP: (148-158)/(74-86) 158/79 (10/10 2127) SpO2:  [99 %-100 %] 100 % (10/10 2127) Weight:  [57.7 kg] 57.7 kg (10/10 0500)  Physical Exam: General: Opens eyes to voice, answers some questions Eyes: Pinpoint pupils Cardiovascular: RRR, no m/r/g appreciated.  Pulmonary: Normal WOB. CTAB with no w/c/r present. Neurologic: Symmetric patellar reflexes extremities appropriately  Laboratory: Most recent CBC Lab Results  Component Value Date   WBC 6.2 10/26/2023   HGB 12.7 (L) 10/26/2023   HCT 41.5 10/26/2023   MCV 87.0 10/26/2023   PLT 286 10/26/2023   Most recent BMP    Latest Ref Rng & Units 10/27/2023    3:40 AM  BMP  Glucose 70 - 99 mg/dL 887   BUN 8 - 23 mg/dL 25   Creatinine 9.38 - 1.24 mg/dL 9.00   Sodium 864 - 854 mmol/L 145   Potassium 3.5 - 5.1 mmol/L 4.1   Chloride  98 - 111 mmol/L 110   CO2 22 - 32 mmol/L 23   Calcium  8.9 - 10.3 mg/dL 9.0     [name], DO 10/28/2023, 6:57 AM  PGY-1, [facility] FPTS Intern pager: [phone], text pages welcome Secure chat group [facility] Teaching Service

## 2023-10-27 NOTE — Assessment & Plan Note (Addendum)
 RD assessed patient with recommendations for Glucerna shakes 3 times a day. - A.m. Mg, Phos for 2 more days

## 2023-10-27 NOTE — Assessment & Plan Note (Addendum)
 MRI with evidence of new acute stroke.  Multiple areas of infarction within the right MCA territory is concerning for embolic as opposed to thrombotic stroke.  Called Mr. Chandran brother, who is his legal guardian, and we will proceed with further evaluation of the etiology of his stroke.  He has been on DAPT and statin therapy prior to and continued during admission for many prior strokes. Patient is on hospice outpatient, but it is his guardian emphasizes that he would like full evaluation. - AM lipid panel - ordered echocardiogram  - consult palliative care - continue ASA, plavix , statin

## 2023-10-27 NOTE — Inpatient Diabetes Management (Addendum)
 Inpatient Diabetes Program Recommendations  AACE/ADA: New Consensus Statement on Inpatient Glycemic Control (2015)  Target Ranges:  Prepandial:   less than 140 mg/dL      Peak postprandial:   less than 180 mg/dL (1-2 hours)      Critically ill patients:  140 - 180 mg/dL   Lab Results  Component Value Date   GLUCAP 188 (H) 10/27/2023   HGBA1C 11.0 (H) 10/26/2023    Latest Reference Range & Units 10/26/23 17:20 10/26/23 19:48 10/26/23 22:00 10/26/23 22:58 10/27/23 07:39  Glucose-Capillary 70 - 99 mg/dL 745 (H) 718 (H) 637 (H) 337 (H) 188 (H)  (H): Data is abnormally high Review of Glycemic Control  Diabetes history: DM2 Outpatient Diabetes medications: Lantus  20 units daily, Humalog  2-10 units sliding scale TID, Humalog  4 units TID Current orders for Inpatient glycemic control: Novolog  0-20 units correction scale TID & HS  Inpatient Diabetes Program Recommendations:   Noted that CBGs have been greater than 200 mg/dl.  Recommend adding Lantus  10 units daily, continue Novolog  0-20 units correction scale TID & HS. Titrate dosage of Lantus  as needed if blood sugars continue to be elevated.  Marjorie Lunger RN BSN CDE Diabetes Coordinator Pager: [phone]  8am-5pm

## 2023-10-27 NOTE — Assessment & Plan Note (Addendum)
 HTN - holding home Amlodipine  5 mg daily for stable BP, continue Toprol -XL 50 mg daily HLD - continue home Atorvastatin  80 mg daily, Zetia  10 mg daily T2DM - holding home Farxiga  10 mg daily, Metformin  500 mg BID, Lantus  20 units daily, Humalog  4 units TID with meals; management per above H/o of stroke - continue ASA 81 mg daily, Clopidogrel  70 mg daily PTSD/Depression - continue Cymbalta  60 mg daily, Olanzapine  5mg  daily at bedtime, Trazodone  daily at bedtime, Hydroxyzine  25 mg daily at bedtime GERD - continue Pepcid  20 mg daily BPH - continue Flomax  0.4mg  daily at bedtime Malnutrition - continue Folic acid  1 mg daily, Thiamine  100mg  daily

## 2023-10-27 NOTE — Assessment & Plan Note (Addendum)
 Creatinine baseline appears to be around 1.0.  Initial elevation to 1.64 on admission but now back to baseline. -AM BMP

## 2023-10-27 NOTE — Evaluation (Signed)
 Physical Therapy Evaluation Patient Details Name: William Acevedo MRN: [id number] DOB: [birth year] Today's Date: 10/27/2023  History of Present Illness  Pt is a 68 y.o. M who presents 10/25/2023 with AMS. MRI with scattered foci of acute infarction throughout the R MCA territory, including the right insula, right anterior corona radiata, and right centrum semiovale. No acute hemorrhage or significant mass effect. Significant PMH: HTN, HLD, T2DM, h/o stroke, PTSD, depression, and h/o EtOH and cocaine use.  Clinical Impression  Pt recently returned from MRI; MRI showing scattered foci of acute infarction throughout the R MCA. Pt given Ativan  prior to imaging and lethargic. Pt keeping eyes closed throughout evaluation and only following 10-20% of commands. Requires min-mod assist (+2 safety) for bed mobility and transfers to standing. Appears to have mild LUE weakness in comparison to RUE; moves BLE's spontaneously against gravity. Discussed PLOF with pt legal guardian on phone following evaluation; William Acevedo reports that pt is typically total assist, with transfers and ADL's. Recommend HHPT follow up.      If plan is discharge home, recommend the following: A little help with walking and/or transfers;A lot of help with bathing/dressing/bathroom;Assistance with cooking/housework;Assistance with feeding;Direct supervision/assist for medications management;Direct supervision/assist for financial management;Assist for transportation;Help with stairs or ramp for entrance;Supervision due to cognitive status   Can travel by private vehicle        Equipment Recommendations None recommended by PT  Recommendations for Other Services       Functional Status Assessment Patient has had a recent decline in their functional status and demonstrates the ability to make significant improvements in function in a reasonable and predictable amount of time.     Precautions / Restrictions Precautions Precautions:  Fall Restrictions Weight Bearing Restrictions Per Provider Order: No      Mobility  Bed Mobility Overal bed mobility: Needs Assistance Bed Mobility: Rolling, Sidelying to Sit, Sit to Supine Rolling: Min assist Sidelying to sit: Mod assist   Sit to supine: Min assist   General bed mobility comments: Rolling to R/L with minA for bed linen change, sitting up towards R side of bed with assist for trunk elevation. Guidance for trunk to lie back down, pt able to elevate LE's back into bed    Transfers Overall transfer level: Needs assistance Equipment used: 2 person hand held assist Transfers: Sit to/from Stand Sit to Stand: Min assist, +2 physical assistance           General transfer comment: MinA + 2 to stand from edge of bed, pt able to take small step with LLE, but unable to lift RLE, difficulty with motor initiation and sesquencing    Ambulation/Gait               General Gait Details: deferred due to level of arousal  Stairs            Wheelchair Mobility     Tilt Bed    Modified Rankin (Stroke Patients Only) Modified Rankin (Stroke Patients Only) Pre-Morbid Rankin Score: Severe disability Modified Rankin: Severe disability     Balance Overall balance assessment: Needs assistance Sitting-balance support: Feet supported Sitting balance-Leahy Scale: Fair Sitting balance - Comments: CGA   Standing balance support: Bilateral upper extremity supported Standing balance-Leahy Scale: Poor Standing balance comment: Reliant on exernal support of therapist                             Pertinent Vitals/Pain Pain Assessment Pain Assessment:  Faces Faces Pain Scale: No hurt    Home Living Family/patient expects to be discharged to:: Private residence Living Arrangements: Other relatives William Acevedo) Available Help at Discharge: Family;Available 24 hours/day Type of Home: House Home Access: Ramped entrance       Home Layout: One level Home  Equipment: BSC/3in1;Shower seat;Cane - single point;Crutches;Wheelchair - Forensic psychologist (2 wheels);Hospital bed Additional Comments: Aide 6 days/wk, 9-5    Prior Function Prior Level of Function : Needs assist             Mobility Comments: Pt legal guardian reports total, 100% assist, with transfers and does not ambulate, although several months ago was ambulating short distances per chart review. ADLs Comments: Pt legal guardian reports dependent for ADL's, including feeding. Pt uses diapers, can intermittently tell him when he needs to use bathroom and he will assist him to [facility]. Assist for shower transfers and/or with sponge bathing.     Extremity/Trunk Assessment   Upper Extremity Assessment Upper Extremity Assessment: Defer to OT evaluation    Lower Extremity Assessment Lower Extremity Assessment: RLE deficits/detail;LLE deficits/detail RLE Deficits / Details: Moves spontaneously LLE Deficits / Details: Moves spontaneously    Cervical / Trunk Assessment Cervical / Trunk Assessment: Normal  Communication   Communication Communication: Impaired Factors Affecting Communication: Other (comment) (no verbalizations)    Cognition Arousal: Lethargic Behavior During Therapy: Flat affect   PT - Cognitive impairments: Difficult to assess Difficult to assess due to: Level of arousal                     PT - Cognition Comments: Pt given Ativan  prior to MRI; keeping eyes closed throughout evaluation, no verbalizations, follows ~10-20% of 1 step commands Following commands: Impaired Following commands impaired: Follows one step commands inconsistently     Cueing Cueing Techniques: Verbal cues, Gestural cues, Tactile cues     General Comments      Exercises     Assessment/Plan    PT Assessment Patient needs continued PT services  PT Problem List Decreased strength;Decreased activity tolerance;Decreased balance;Decreased mobility;Decreased  cognition;Decreased safety awareness       PT Treatment Interventions DME instruction;Functional mobility training;Therapeutic activities;Therapeutic exercise;Gait training;Balance training;Patient/family education    PT Goals (Current goals can be found in the Care Plan section)  Acute Rehab PT Goals Patient Stated Goal: unable to state PT Goal Formulation: Patient unable to participate in goal setting Time For Goal Achievement: 11/10/23 Potential to Achieve Goals: Fair    Frequency Min 2X/week     Co-evaluation PT/OT/SLP Co-Evaluation/Treatment: Yes Reason for Co-Treatment: For patient/therapist safety;To address functional/ADL transfers PT goals addressed during session: Mobility/safety with mobility         AM-PAC PT 6 Clicks Mobility  Outcome Measure Help needed turning from your back to your side while in a flat bed without using bedrails?: A Little Help needed moving from lying on your back to sitting on the side of a flat bed without using bedrails?: A Lot Help needed moving to and from a bed to a chair (including a wheelchair)?: A Lot Help needed standing up from a chair using your arms (e.g., wheelchair or bedside chair)?: A Lot Help needed to walk in hospital room?: Total Help needed climbing 3-5 steps with a railing? : Total 6 Click Score: 11    End of Session Equipment Utilized During Treatment: Gait belt Activity Tolerance: Patient tolerated treatment well Patient left: in bed;with call bell/phone within reach;with bed alarm set Nurse Communication:  Mobility status PT Visit Diagnosis: Unsteadiness on feet (R26.81);Difficulty in walking, not elsewhere classified (R26.2)    Time: 8869-8846 PT Time Calculation (min) (ACUTE ONLY): 23 min   Charges:   PT Evaluation $PT Eval Moderate Complexity: 1 Mod   PT General Charges $$ ACUTE PT VISIT: 1 Visit         Aleck Daring, PT, DPT Acute Rehabilitation Services Office [phone]   Aleck ONEIDA Daring 10/27/2023, 12:13 PM

## 2023-10-27 NOTE — Evaluation (Signed)
 Occupational Therapy Evaluation Patient Details Name: William Acevedo MRN: [id number] DOB: 05/12/1955 Today's Date: 10/27/2023   History of Present Illness   Pt is a 68 y.o. M who presents 10/25/2023 with AMS. MRI with scattered foci of acute infarction throughout the R MCA territory, including the right insula, right anterior corona radiata, and right centrum semiovale. No acute hemorrhage or significant mass effect. Significant PMH: HTN, HLD, T2DM, h/o stroke, PTSD, depression, and h/o EtOH and cocaine use.     Clinical Impressions Per PT conversation with pt's legal guardian, pt requiring up to Total assist for all ADLs and functional transfers/mobility at baseline. Pt now presents with increased lethargy (suspect due to medication per RN), impaired B UE coordination, impaired cognition, and generalized B UE weakness. Pt currently demonstrating ability to complete ADLs largely with Max to Total assist of +1 to +2, bed mobility with Min to Mod assist, and STS transfers with Min assist +2. Pt limited by lethargy and current cognitive level but making good attempts to participate during session. Pt will benefit from acute skilled OT services to address deficits, decrease caregiver burden, and increase safety and independence with functional tasks. Post acute discharge, pt will benefit from [facility] OT paired with continued 24/7 assistance/supervision of family/caregiver.      If plan is discharge home, recommend the following:   Two people to help with walking and/or transfers;Two people to help with bathing/dressing/bathroom;Assistance with cooking/housework;Assistance with feeding;Direct supervision/assist for medications management;Direct supervision/assist for financial management;Assist for transportation;Help with stairs or ramp for entrance;Supervision due to cognitive status;Other (comment) (Pt requires 24/7 supervision/assistance)     Functional Status Assessment   Patient has had a  recent decline in their functional status and demonstrates the ability to make significant improvements in function in a reasonable and predictable amount of time.     Equipment Recommendations   None recommended by OT (Pt already has needed equipment)     Recommendations for Other Services         Precautions/Restrictions   Precautions Precautions: Fall Recall of Precautions/Restrictions: Impaired Restrictions Weight Bearing Restrictions Per Provider Order: No     Mobility Bed Mobility Overal bed mobility: Needs Assistance Bed Mobility: Rolling, Sidelying to Sit, Sit to Supine Rolling: Min assist Sidelying to sit: Mod assist   Sit to supine: Min assist   General bed mobility comments: Rolling to R/L with minA for bed linen change, sitting up towards R side of bed with assist for trunk elevation. Guidance for trunk to lie back down, pt able to elevate LE's back into bed    Transfers Overall transfer level: Needs assistance Equipment used: 2 person hand held assist Transfers: Sit to/from Stand Sit to Stand: Min assist, +2 physical assistance           General transfer comment: MinA + 2 to stand from edge of bed, pt able to take small step with LLE, but unable to lift RLE, difficulty with motor initiation and sesquencing      Balance Overall balance assessment: Needs assistance Sitting-balance support: Feet supported Sitting balance-Leahy Scale: Fair Sitting balance - Comments: CGA   Standing balance support: Bilateral upper extremity supported Standing balance-Leahy Scale: Poor Standing balance comment: Reliant on exernal support of therapist                           ADL either performed or assessed with clinical judgement   ADL Overall ADL's : Needs assistance/impaired   Eating/Feeding  Details (indicate cue type and reason): Self-feeding not attempted this session for safety due to pt with decreased level of alertness. Per RN and NT report,  earlier this day, pt opening mouth to eat/be fed with verbal cues but with pt not otherwise participating in self-feeding Grooming: Maximal assistance;Total assistance;Sitting;Wash/dry hands;Wash/dry face;Cueing for sequencing Grooming Details (indicate cue type and reason): pt with active partcipation in washing/drying face with hand-over-hand assist and verbal cues Upper Body Bathing: Total assistance;Bed level   Lower Body Bathing: Total assistance;+2 for safety/equipment;Bed level Lower Body Bathing Details (indicate cue type and reason): +2 for safety due to pt intermittently puling at lines and male Purewick during ADLs Upper Body Dressing : Maximal assistance;Bed level;Cueing for sequencing   Lower Body Dressing: Total assistance;+2 for safety/equipment;Bed level Lower Body Dressing Details (indicate cue type and reason): +2 for safety due to pt intermittently puling at lines and male Purewick during ADLs   Toilet Transfer Details (indicate cue type and reason): deferred this session Toileting- Clothing Manipulation and Hygiene: Total assistance;Bed level;+2 for safety/equipment Toileting - Clothing Manipulation Details (indicate cue type and reason): +2 for safety due to pt intermittently puling at lines and male Purewick during ADLs       General ADL Comments: Pt with decreased level of alertness and current cognition affecting functional level     Vision Baseline Vision/History: 1 Wears glasses (per chart review) Patient Visual Report: Other (comment) (Pt unable to report) Additional Comments: Pt letharguc and largely keeping eyes closed during session. OT to further assess vision during funcitonal contexts in future OT sessions.     Perception         Praxis         Pertinent Vitals/Pain Pain Assessment Pain Assessment: Faces Faces Pain Scale: No hurt Pain Intervention(s): Monitored during session     Extremity/Trunk Assessment Upper Extremity Assessment Upper  Extremity Assessment: Difficult to assess due to impaired cognition;Generalized weakness;RUE deficits/detail;LUE deficits/detail RUE Deficits / Details: difficult to fully assess; generalized weakness; decreased coordination; intermittent spontaneous use of UE during session LUE Deficits / Details: difficult to fully assess; generalized weakness; decreased coordination; intermittent spontaneous use of UE during session   Lower Extremity Assessment Lower Extremity Assessment: Defer to PT evaluation RLE Deficits / Details: Moves spontaneously LLE Deficits / Details: Moves spontaneously   Cervical / Trunk Assessment Cervical / Trunk Assessment: Normal   Communication Communication Communication: Impaired Factors Affecting Communication: Other (comment) (no verbalizations)   Cognition Arousal: Lethargic Behavior During Therapy: Flat affect Cognition: Cognition impaired, History of cognitive impairments, No family/caregiver present to determine baseline, Difficult to assess Difficult to assess due to: Level of arousal           OT - Cognition Comments: Pt with history of cognitive deficts; however, OT is uncertain if pt cognition is at abseline. Cognition difficult to assess this session as pt lethargic and recently having received ativan  per RN report. Pt largely keeping eye closed and with no verablizations during session, but with pt intermittently resonding to name, 1-step commands, and participating in ADLs.                 Following commands: Impaired Following commands impaired: Follows one step commands inconsistently, Follows one step commands with increased time     Cueing  General Comments   Cueing Techniques: Verbal cues;Gestural cues;Tactile cues  RN and NT each present during a portion of session   Exercises     Shoulder Instructions  Home Living Family/patient expects to be discharged to:: Private residence Living Arrangements: Other relatives  (brother/legal guardian [name]) Available Help at Discharge: Family;Available 24 hours/day;Personal care attendant (Aide 6 days/wk, 9-5) Type of Home: House Home Access: Ramped entrance     Home Layout: One level     Bathroom Shower/Tub: Chief Strategy Officer: Handicapped height Bathroom Accessibility: Yes   Home Equipment: BSC/3in1;Shower seat;Cane - single point;Crutches;Wheelchair - Forensic psychologist (2 wheels);Hospital bed   Additional Comments: Home set-up and PLOF per pt's legal guardian in phone conversation with PT on this day      Prior Functioning/Environment Prior Level of Function : Needs assist             Mobility Comments: Pt legal guardian reports total, 100% assist, with transfers and does not ambulate, although several months ago was ambulating short distances per chart review. ADLs Comments: Pt legal guardian reports pt requires up to Total assistance for all ADLs, including feeding. Pt uses diapers, can intermittently tell him when he needs to use bathroom and he will assist him to [facility]. Assist for shower transfers and/or with sponge bathing.    OT Problem List: Decreased strength;Decreased activity tolerance;Impaired balance (sitting and/or standing);Decreased coordination;Decreased cognition;Decreased knowledge of use of DME or AE   OT Treatment/Interventions: Self-care/ADL training;Therapeutic exercise;DME and/or AE instruction;Therapeutic activities;Cognitive remediation/compensation;Patient/family education;Balance training;Neuromuscular education      OT Goals(Current goals can be found in the care plan section)   Acute Rehab OT Goals Patient Stated Goal: pt unable to state OT Goal Formulation: Patient unable to participate in goal setting Time For Goal Achievement: 11/10/23 Potential to Achieve Goals: Fair ADL Goals Pt Will Perform Eating: with mod assist;sitting (with adaptive equipment as needed) Pt Will Perform Grooming: with  mod assist;sitting (with adaptive equipment as needed) Pt Will Perform Upper Body Dressing: with min assist;sitting Pt Will Transfer to Toilet: with min assist;bedside commode (step-pivot with least restrictive AD) Additional ADL Goal #1: Patient will demonstrate ability to appropriately follow 1-step commands in 3/5 opportunities during familiar functional tasks.   OT Frequency:  Min 2X/week    Co-evaluation PT/OT/SLP Co-Evaluation/Treatment: Yes Reason for Co-Treatment: For patient/therapist safety;To address functional/ADL transfers PT goals addressed during session: Mobility/safety with mobility OT goals addressed during session: ADL's and self-care;Other (comment) (Cognition)      AM-PAC OT 6 Clicks Daily Activity     Outcome Measure Help from another person eating meals?: Total Help from another person taking care of personal grooming?: Total (Max to Total) Help from another person toileting, which includes using toliet, bedpan, or urinal?: Total Help from another person bathing (including washing, rinsing, drying)?: Total Help from another person to put on and taking off regular upper body clothing?: Total Help from another person to put on and taking off regular lower body clothing?: Total 6 Click Score: 6   End of Session Equipment Utilized During Treatment: Gait belt Nurse Communication: Mobility status;Other (comment) (Pt functional level with self-feeding earlier in the day)  Activity Tolerance: Patient tolerated treatment well;Patient limited by lethargy;Other (comment) (Limited by cognitve level) Patient left: in bed;with call bell/phone within reach;with bed alarm set  OT Visit Diagnosis: Other abnormalities of gait and mobility (R26.89);Muscle weakness (generalized) (M62.81);Ataxia, unspecified (R27.0);Other symptoms and signs involving cognitive function                Time: 8865-8847 OT Time Calculation (min): 18 min Charges:  OT General Charges $OT Visit: 1  Visit OT Evaluation $OT Eval Moderate Complexity:  1 Mod  Margarie Rockey HERO., OTR/L, [place] Acute Rehab [phone]   Margarie FORBES Horns 10/27/2023, 2:33 PM

## 2023-10-27 NOTE — Assessment & Plan Note (Addendum)
 RD assessed patient with recommendations for Glucerna shakes 3 times a day. - AM Mg, Phos, pending

## 2023-10-27 NOTE — Assessment & Plan Note (Addendum)
 Glucose ranged from 112-337.  Received 29 units of aspart yesterday.  Do not suspect he will eat much today with his decreased alertness compared to yesterday.  - change to moderate SSI ACHS - will hold off on basal insulin  at this time (restart at 5 units and reduce sliding scale pending intake)

## 2023-10-27 NOTE — Assessment & Plan Note (Addendum)
 Glucose ranged from 112-337.  Received 6 units of aspart yesterday. Took some PO yesterday.  - Change to moderate SSI ACHS - Lantus  5 units daily

## 2023-10-27 NOTE — H&P (Signed)
 Consulted neurology, and spoke with Jorene Alamin, NP, for further workup of his acute strokes.  Echo, lipid panel, A1c ordered and pending.  Based on recommendations, additionally ordered CTA neck.  Appreciate the consult and any additional neurological recommendations.  Elio Art, MD Family Medicine PGY-1 10/27/2023

## 2023-10-28 ENCOUNTER — Inpatient Hospital Stay (HOSPITAL_COMMUNITY)

## 2023-10-28 DIAGNOSIS — I7090 Unspecified atherosclerosis: Secondary | ICD-10-CM

## 2023-10-28 DIAGNOSIS — I63512 Cerebral infarction due to unspecified occlusion or stenosis of left middle cerebral artery: Secondary | ICD-10-CM

## 2023-10-28 DIAGNOSIS — I639 Cerebral infarction, unspecified: Secondary | ICD-10-CM | POA: Diagnosis not present

## 2023-10-28 DIAGNOSIS — I6389 Other cerebral infarction: Secondary | ICD-10-CM

## 2023-10-28 DIAGNOSIS — R1310 Dysphagia, unspecified: Secondary | ICD-10-CM

## 2023-10-28 DIAGNOSIS — E785 Hyperlipidemia, unspecified: Secondary | ICD-10-CM

## 2023-10-28 DIAGNOSIS — F17210 Nicotine dependence, cigarettes, uncomplicated: Secondary | ICD-10-CM

## 2023-10-28 LAB — ECHOCARDIOGRAM COMPLETE
AR max vel: 2.77 cm2
AV Area VTI: 2.79 cm2
AV Area mean vel: 3.19 cm2
AV Mean grad: 1.6 mmHg
AV Peak grad: 3.8 mmHg
Ao pk vel: 0.97 m/s
Area-P 1/2: 2.8 cm2
MV VTI: 1.56 cm2
S' Lateral: 2.1 cm
Weight: 1911.83 [oz_av]

## 2023-10-28 LAB — GLUCOSE, CAPILLARY
Glucose-Capillary: 220 mg/dL — ABNORMAL HIGH (ref 70–99)
Glucose-Capillary: 326 mg/dL — ABNORMAL HIGH (ref 70–99)
Glucose-Capillary: 276 mg/dL — ABNORMAL HIGH (ref 70–99)
Glucose-Capillary: 360 mg/dL — ABNORMAL HIGH (ref 70–99)

## 2023-10-28 LAB — BASIC METABOLIC PANEL WITH GFR
Anion gap: 19 — ABNORMAL HIGH (ref 5–15)
BUN: 23 mg/dL (ref 8–23)
CO2: 20 mmol/L — ABNORMAL LOW (ref 22–32)
Calcium: 9.4 mg/dL (ref 8.9–10.3)
Chloride: 107 mmol/L (ref 98–111)
Creatinine, Ser: 1.22 mg/dL (ref 0.61–1.24)
GFR, Estimated: >60 mL/min (ref >60)
Glucose, Bld: 238 mg/dL — ABNORMAL HIGH (ref 70–99)
Potassium: 4.5 mmol/L (ref 3.5–5.1)
Sodium: 146 mmol/L — ABNORMAL HIGH (ref 135–145)

## 2023-10-28 LAB — LIPID PANEL
Cholesterol: 97 mg/dL (ref 0–200)
HDL: 33 mg/dL — ABNORMAL LOW (ref >40)
LDL Cholesterol: 47 mg/dL (ref 0–99)
Total CHOL/HDL Ratio: 2.9 ratio
Triglycerides: 83 mg/dL (ref <150)
VLDL: 17 mg/dL (ref 0–40)

## 2023-10-28 LAB — PHOSPHORUS: Phosphorus: 4.7 mg/dL — ABNORMAL HIGH (ref 2.5–4.6)

## 2023-10-28 LAB — MAGNESIUM: Magnesium: 2.7 mg/dL — ABNORMAL HIGH (ref 1.7–2.4)

## 2023-10-28 MED ORDER — INSULIN ASPART 100 UNIT/ML IJ SOLN
0.0000 [IU] | Freq: Three times a day (TID) | INTRAMUSCULAR | Status: DC
  Administered 2023-10-28: 8 [IU] via SUBCUTANEOUS
  Administered 2023-10-28: 11 [IU] via SUBCUTANEOUS
  Administered 2023-10-29: 5 [IU] via SUBCUTANEOUS
  Administered 2023-10-29: 11 [IU] via SUBCUTANEOUS
  Administered 2023-10-29: 8 [IU] via SUBCUTANEOUS
  Administered 2023-10-30: 11 [IU] via SUBCUTANEOUS
  Administered 2023-10-30: 8 [IU] via SUBCUTANEOUS

## 2023-10-28 MED ORDER — INSULIN GLARGINE 100 UNIT/ML ~~LOC~~ SOLN
5.0000 [IU] | Freq: Every day | SUBCUTANEOUS | Status: DC
Start: 2023-10-28 — End: 2023-10-29
  Administered 2023-10-28 – 2023-10-29 (×2): 5 [IU] via SUBCUTANEOUS
  Filled 2023-10-28 (×2): qty 0.05

## 2023-10-28 NOTE — Progress Notes (Signed)
 Echocardiogram 2D Echocardiogram has been performed.  William Acevedo 10/28/2023, 9:05 AM

## 2023-10-28 NOTE — Progress Notes (Signed)
 STROKE TEAM PROGRESS NOTE   SUBJECTIVE (INTERVAL HISTORY) No family is at the bedside.  Overall his condition is stable.  No acute event overnight.  On diet.   OBJECTIVE Temp:  [98 F (36.7 C)-98.6 F (37 C)] 98.1 F (36.7 C) (10/11 0728) Pulse Rate:  [68-78] 72 (10/11 0728) Cardiac Rhythm: Normal sinus rhythm (10/11 0826) Resp:  [13-19] 18 (10/11 0728) BP: (124-164)/(78-85) 164/78 (10/11 0728) SpO2:  [98 %-100 %] 98 % (10/11 0728) Weight:  [54.2 kg] 54.2 kg (10/11 0455)  Recent Labs  Lab 10/27/23 1132 10/27/23 1643 10/27/23 2129 10/28/23 0726 10/28/23 1133  GLUCAP 363* 249* 247* 220* 326*   Recent Labs  Lab 10/26/23 0823 10/26/23 1200 10/26/23 1524 10/27/23 0340 10/28/23 0635  NA 146* 143 141 145 146*  K 5.0 4.2 4.3 4.1 4.5  CL 106 106 107 110 107  CO2 22 23 22 23  20*  GLUCOSE 183* 179* 260* 112* 238*  BUN 24* 21 22 25* 23  CREATININE 1.17 1.10 1.14 0.99 1.22  CALCIUM  9.3 8.9 8.7* 9.0 9.4  MG  --   --   --  2.2 2.7*  PHOS  --   --   --  3.4 4.7*   Recent Labs  Lab 10/25/23 1424  AST 24  ALT 29  ALKPHOS 64  BILITOT 1.4*  PROT 7.6  ALBUMIN 3.8   Recent Labs  Lab 10/25/23 1423 10/25/23 1424 10/26/23 0430  WBC  --  6.5 6.2  NEUTROABS  --  4.4  --   HGB 13.3 12.6* 12.7*  HCT 39.0 41.4 41.5  MCV  --  88.8 87.0  PLT  --  280 286   No results for input(s): CKTOTAL, CKMB, CKMBINDEX, TROPONINI in the last 168 hours. No results for input(s): LABPROT, INR in the last 72 hours. Recent Labs    10/25/23 1740  COLORURINE YELLOW  LABSPEC 1.020  PHURINE 5.0  GLUCOSEU >=500*  HGBUR NEGATIVE  BILIRUBINUR NEGATIVE  KETONESUR 20*  PROTEINUR NEGATIVE  NITRITE POSITIVE*  LEUKOCYTESUR NEGATIVE       Component Value Date/Time   CHOL 97 10/28/2023 0635   TRIG 83 10/28/2023 0635   HDL 33 (L) 10/28/2023 0635   CHOLHDL 2.9 10/28/2023 0635   VLDL 17 10/28/2023 0635   LDLCALC 47 10/28/2023 0635   Lab Results  Component Value Date   HGBA1C  11.0 (H) 10/26/2023      Component Value Date/Time   LABOPIA NONE DETECTED 10/25/2023 1740   COCAINSCRNUR NONE DETECTED 10/25/2023 1740   LABBENZ NONE DETECTED 10/25/2023 1740   AMPHETMU NONE DETECTED 10/25/2023 1740   THCU NONE DETECTED 10/25/2023 1740   LABBARB NONE DETECTED 10/25/2023 1740    Recent Labs  Lab 10/25/23 1430  ETH <15    I have personally reviewed the radiological images below and agree with the radiology interpretations.  CT ANGIO NECK W OR WO CONTRAST Result Date: 10/27/2023 EXAM: CTA Neck 10/27/2023 06:17:35 PM TECHNIQUE: CT of the neck was performed without and with the administration of 75 mL of iohexol  (OMNIPAQUE ) 350 MG/ML injection. Multiplanar 2D and/or 3D reformatted images are provided for review. Automated exposure control, iterative reconstruction, and/or weight based adjustment of the mA/kV was utilized to reduce the radiation dose to as low as reasonably achievable. Stenosis of the internal carotid arteries measured using NASCET criteria. COMPARISON: CT angio head and neck 07/10/2020 CLINICAL HISTORY: Stroke, follow up. 75mL Omni 350 x2. Initial scan undiagnostic due to patient motion. Patient immobilized and given  verbal reminders throughout duration of scan. Best images possible due to patient condition. ; AMS. FINDINGS: AORTIC ARCH AND ARCH VESSELS: Atherosclerotic calcifications are present at the aortic arch and proximal great vessel origins without significant stenosis relative to more distal vessel. Up to 50% stenosis is present in the left subclavian artery. No dissection or arterial injury. No significant stenosis of the brachiocephalic arteries. CERVICAL CAROTID ARTERIES: No dissection or arterial injury. Right: Atherosclerotic changes are present in the distal right common carotid artery, the right carotid bifurcation, and the proximal right ICA without significant stenosis relative to the more distal vessel. Left: Atherosclerotic changes are present in  the distal half of the left common carotid artery, the left carotid bifurcation, and the proximal left ICA without focal stenosis. CERVICAL VERTEBRAL ARTERIES: Moderate stenosis is present at the origin of a right vertebral artery. No dissection or arterial injury. No significant stenosis of the left vertebral artery. LUNGS AND MEDIASTINUM: Unremarkable. SOFT TISSUES: No acute abnormality. BONES: Straightening of the normal cervical lordosis is again noted. Grade 1 degenerative anterolisthesis at C3-C4 and at C4-C5 has progressed. Progressive endplate sclerotic changes at C5-C6 and C6-C7 are noted. No acute abnormality. INTRACRANIAL VASCULATURE: Incidental imaging of the Circle of Willis demonstrates atherosclerotic calcification within the cavernous internal carotid arteries bilaterally. A 50% stenosis is present in the anterior genu of the right ICA. The right A1 segment is hypoplastic. A2 segments are within normal limits. New high-grade stenoses are present within the proximal right M2 branches. IMPRESSION: 1. Atherosclerotic calcification within the cavernous internal carotid arteries bilaterally, with 50% stenosis in the anterior genu of the right ICA. 2. New high-grade stenoses in the proximal right M2 branches. 3. Moderate stenosis at the origin of the right vertebral artery, stable. 4. Up to 50% stenosis in the left subclavian artery. 5. Atherosclerotic changes in the distal right common carotid artery, right carotid bifurcation, and proximal right ICA without significant stenosis relative to the more distal vessel. 6. Atherosclerotic changes in the distal half of the left common carotid artery, left carotid bifurcation, and proximal left ICA without focal stenosis. Electronically signed by: Lonni Necessary MD 10/27/2023 07:13 PM EDT RP Workstation: [id number]   MR BRAIN WO CONTRAST Result Date: 10/27/2023 EXAM: MR Brain without Intravenous Contrast. CLINICAL HISTORY: Mental status change, unknown  cause. Second attempt, technically difficult due to patient motion. TECHNIQUE: Magnetic resonance images of the brain without intravenous contrast in multiple planes. CONTRAST: Without. COMPARISON: Head CT 10/25/2023, MRI brain 05/13/2023. FINDINGS: LIMITATIONS: Motion artifact degrades the image quality of multiple sequences. BRAIN: Scattered foci of acute infarction throughout the right MCA territory, including the right insula, right anterior corona radiata and right centrum semiovale. No acute hemorrhage or significant mass effect. No extra-axial fluid collection. No cerebellar tonsillar ectopia. The central arterial and venous flow voids are patent. Background of severe chronic small vessel disease with old infarcts in the right occipital temporal junction, left ACA territory, and right superior parietal lobe. Chronic micro hemorrhage in the left prefrontal gyrus. Old lacunar infarcts in the bilateral cerebellar hemispheres. VENTRICLES: No hydrocephalus. ORBITS: The orbits are normal. SINUSES AND MASTOIDS: The sinuses and mastoid air cells are clear. BONES: No acute fracture or focal osseous lesion. IMPRESSION: 1. Scattered foci of acute infarction throughout the right MCA territory, including the right insula, right anterior corona radiata, and right centrum semiovale. No acute hemorrhage or significant mass effect. 2. Image quality is degraded by motion artifact. Electronically signed by: Ryan Chess MD 10/27/2023 10:58 AM EDT  RP Workstation: [id number]   DG Chest Port 1 View Result Date: 10/25/2023 CLINICAL DATA:  Altered mental status. EXAM: PORTABLE CHEST 1 VIEW COMPARISON:  May 13, 2023 FINDINGS: The heart size and mediastinal contours are within normal limits. There is marked severity calcification of the aortic arch. No acute infiltrate, pleural effusion or pneumothorax is identified. No acute osseous abnormalities are seen. IMPRESSION: No active cardiopulmonary disease. Electronically Signed    By: Suzen Dials M.D.   On: 10/25/2023 14:40   CT Head Wo Contrast Result Date: 10/25/2023 CLINICAL DATA:  Altered mental status. EXAM: CT HEAD WITHOUT CONTRAST TECHNIQUE: Contiguous axial images were obtained from the base of the skull through the vertex without intravenous contrast. RADIATION DOSE REDUCTION: This exam was performed according to the departmental dose-optimization program which includes automated exposure control, adjustment of the mA and/or kV according to patient size and/or use of iterative reconstruction technique. COMPARISON:  May 13, 2023 FINDINGS: Brain: There is generalized cerebral atrophy with widening of the extra-axial spaces and ventricular dilatation. There are areas of decreased attenuation within the white matter tracts of the supratentorial brain, consistent with microvascular disease changes. Chronic bilateral cerebellar, bilateral occipital lobe, left frontal lobe and right parietal lobe infarcts are seen. Vascular: Marked severity bilateral cavernous carotid artery calcification is noted. Skull: A chronic nondisplaced left-sided nasal bone fracture is present. A chronic fracture of the left zygomatic arch is also seen. Sinuses/Orbits: No acute finding. Other: None. IMPRESSION: 1. Generalized cerebral atrophy with chronic bilateral cerebellar, bilateral occipital lobe, left frontal lobe and right parietal lobe infarcts. 2. No acute intracranial abnormality. 3. Chronic nondisplaced left-sided nasal bone fracture. 4. Chronic fracture of the left zygomatic arch. Electronically Signed   By: Suzen Dials M.D.   On: 10/25/2023 14:39     PHYSICAL EXAM  Temp:  [98 F (36.7 C)-98.6 F (37 C)] 98.1 F (36.7 C) (10/11 0728) Pulse Rate:  [68-78] 72 (10/11 0728) Resp:  [13-19] 18 (10/11 0728) BP: (124-164)/(78-85) 164/78 (10/11 0728) SpO2:  [98 %-100 %] 98 % (10/11 0728) Weight:  [54.2 kg] 54.2 kg (10/11 0455)  General - Well nourished, well developed, in no  apparent distress.  Ophthalmologic - fundi not visualized due to noncooperation.  Cardiovascular - Regular rhythm and rate.  Neuro - lethargic but awake, alert, eyes open, limited language output, able to tell  me his name in moderate dysarthric voice, however, not able to repeat or name, not able to answer orientation questions, only followed two midline commands but not able to follow peripheral commands. Left gaze barely cross midline, R gaze slow but able to perform, tracking on the right visual field, inconsistently blinking to visual threat bilaterally. Mild R nasolabial fold flattening. Tongue midline. Bilateral UEs LEs against gravity and seems symmetrical, but not quite following commands on movement. Sensation, coordination and gait not tested.   ASSESSMENT/PLAN William Acevedo is a 68 y.o. male with history of hypertension, hyperlipidemia, diabetes, PTSD, alcohol abuse, cocaine abuse, smoker, history of strokes admitted for altered mental status and found to be in DKA. No TNK given due to outside window.    Stroke:  left MCA scattered infarcts likely secondary to diffuse intracranial stenosis in the setting of risk factors CT no acute abnormality CT head and neck bilateral ICA siphon 50% stenosis, right M2 high-grade stenosis, left subclavian artery 50% stenosis, diffuse intracranial atherosclerosis MRI left MCA scattered infarcts 2D Echo EF 70 to 75% LDL 47 HgbA1c 11.0 UDS negative Lovenox  for VTE  prophylaxis aspirin  81 mg daily and clopidogrel  75 mg daily prior to admission, now on home aspirin  81 mg daily and clopidogrel  75 mg daily.  Ongoing aggressive stroke risk factor management Therapy recommendations: Home health PT and OT Disposition: Pending, palliative care on board, patient is hospice patient  History of stroke 03/2020 admitted for right cerebellar, right ACA, right MCA/PCA, bilateral MCA/ACA infarcts.  CT head and neck bilateral ICA siphon 50% stenosis.  EF  65 to 70%.  LDL 137, A1c 10.6.  UDS positive for cocaine and THC.  Patient also found to have renal infarcts with renal artery stenosis.  Discharged on aspirin  and statin. 06/2020 admitted for left frontal small scattered infarcts.  CTA head and neck bilateral ICA siphon 50% stenosis, LDL 98, A1c 8.8.  UDS positive for cocaine.  Discharged on DAPT and Lipitor  40 02/2022 admitted for left ACA infarct.  CT head and neck showed left A2 occlusion left ICA siphon severe stenosis.  EF 55 to 60%.  LDL 217, A1c 10.6, UDS positive for cocaine.  Discharged on DAPT and Lipitor  80  Diabetes HgbA1c 11.0 goal < 7.0 Uncontrolled Currently on Lantus  CBG monitoring SSI DM education and close PCP follow up  Hypertension Stable Avoid low BP Long term BP goal normotensive  Hyperlipidemia Home meds: Lipitor  80 and Zetia  10 LDL 47, goal < 70 Now on Lipitor  80 and Zetia  10 Continue statin and Zetia  at discharge  Tobacco abuse Current smoker Smoking cessation counseling will be provided  Dysphagia Now on dysphagia 1 and thin liquid Advance diet as able Avoid aspiration  Other Stroke Risk Factors Advanced age History of cocaine user History of ETOH use, limited alcohol use no more than 1 drink, on FA/B1/MVI  Other Active Problems PTSD, on Cymbalta , Zyprexa  and hydroxyzine   Hospital day # 3  Neurology will sign off. Please call with questions.  No neuro follow-up needed at this time given patient in hospice.  Thanks for the consult.   Ary Cummins, MD PhD Stroke Neurology 10/28/2023 12:08 PM    To contact Stroke Continuity provider, please refer to [email or web address]. After hours, contact General Neurology

## 2023-10-28 NOTE — Plan of Care (Signed)
 Called patient's brother, William Acevedo, to discuss care. Relayed that William Acevedo has had a stroke, and other than improving control of his diabetes, there is not more that can be done to prevent future strokes. Given that the patient is hospice, discussed whether avoiding future hospitalizations would be something they desired. Brother would like to come in tomorrow and see William Acevedo, and discuss options with Palliative.   Requested that we call patient's son, William Acevedo to update him. Phone number is [phone].  Lucie Pinal, DO PGY-2, Family Medicine

## 2023-10-29 ENCOUNTER — Inpatient Hospital Stay (HOSPITAL_COMMUNITY)

## 2023-10-29 DIAGNOSIS — Z515 Encounter for palliative care: Secondary | ICD-10-CM | POA: Diagnosis not present

## 2023-10-29 DIAGNOSIS — Z66 Do not resuscitate: Secondary | ICD-10-CM | POA: Diagnosis not present

## 2023-10-29 DIAGNOSIS — Z7189 Other specified counseling: Secondary | ICD-10-CM | POA: Diagnosis not present

## 2023-10-29 DIAGNOSIS — I639 Cerebral infarction, unspecified: Secondary | ICD-10-CM | POA: Diagnosis not present

## 2023-10-29 LAB — GLUCOSE, CAPILLARY
Glucose-Capillary: 255 mg/dL — ABNORMAL HIGH (ref 70–99)
Glucose-Capillary: 347 mg/dL — ABNORMAL HIGH (ref 70–99)
Glucose-Capillary: 272 mg/dL — ABNORMAL HIGH (ref 70–99)
Glucose-Capillary: 272 mg/dL — ABNORMAL HIGH (ref 70–99)

## 2023-10-29 LAB — BASIC METABOLIC PANEL WITH GFR
Anion gap: 12 (ref 5–15)
BUN: 33 mg/dL — ABNORMAL HIGH (ref 8–23)
CO2: 26 mmol/L (ref 22–32)
Calcium: 9.1 mg/dL (ref 8.9–10.3)
Chloride: 107 mmol/L (ref 98–111)
Creatinine, Ser: 1.32 mg/dL — ABNORMAL HIGH (ref 0.61–1.24)
GFR, Estimated: 58 mL/min — ABNORMAL LOW (ref >60)
Glucose, Bld: 247 mg/dL — ABNORMAL HIGH (ref 70–99)
Potassium: 4 mmol/L (ref 3.5–5.1)
Sodium: 145 mmol/L (ref 135–145)

## 2023-10-29 LAB — MAGNESIUM: Magnesium: 2.8 mg/dL — ABNORMAL HIGH (ref 1.7–2.4)

## 2023-10-29 LAB — PHOSPHORUS: Phosphorus: 4.4 mg/dL (ref 2.5–4.6)

## 2023-10-29 MED ORDER — POLYETHYLENE GLYCOL 3350 17 G PO PACK
17.0000 g | PACK | Freq: Every day | ORAL | Status: DC
  Administered 2023-10-29 – 2023-10-30 (×2): 17 g via ORAL
  Filled 2023-10-29 (×2): qty 1

## 2023-10-29 MED ORDER — IOHEXOL 350 MG/ML SOLN
75.0000 mL | Freq: Once | INTRAVENOUS | Status: AC | PRN
  Administered 2023-10-29: 75 mL via INTRAVENOUS

## 2023-10-29 MED ORDER — INSULIN GLARGINE 100 UNIT/ML ~~LOC~~ SOLN
5.0000 [IU] | Freq: Once | SUBCUTANEOUS | Status: AC
  Administered 2023-10-29: 5 [IU] via SUBCUTANEOUS
  Filled 2023-10-29: qty 0.05

## 2023-10-29 MED ORDER — AMLODIPINE BESYLATE 5 MG PO TABS
5.0000 mg | ORAL_TABLET | Freq: Every day | ORAL | Status: DC
  Administered 2023-10-29 – 2023-10-30 (×2): 5 mg via ORAL
  Filled 2023-10-29 (×2): qty 1

## 2023-10-29 MED ORDER — SENNA 8.6 MG PO TABS
1.0000 | ORAL_TABLET | Freq: Every day | ORAL | Status: DC
  Administered 2023-10-29 – 2023-10-30 (×2): 8.6 mg via ORAL
  Filled 2023-10-29 (×2): qty 1

## 2023-10-29 MED ORDER — INSULIN GLARGINE 100 UNIT/ML ~~LOC~~ SOLN
10.0000 [IU] | Freq: Every day | SUBCUTANEOUS | Status: DC
  Administered 2023-10-30: 10 [IU] via SUBCUTANEOUS
  Filled 2023-10-29: qty 0.1

## 2023-10-29 MED ORDER — DAPAGLIFLOZIN PROPANEDIOL 10 MG PO TABS
10.0000 mg | ORAL_TABLET | Freq: Every day | ORAL | Status: DC
  Administered 2023-10-29 – 2023-10-30 (×2): 10 mg via ORAL
  Filled 2023-10-29 (×2): qty 1

## 2023-10-29 NOTE — Assessment & Plan Note (Addendum)
 HTN - Restart home Amlodipine  5 mg daily, continue Toprol -XL 50 mg daily HLD - continue home Atorvastatin  80 mg daily, Zetia  10 mg daily T2DM - Restart home Farxiga  10 mg daily, hold home Metformin  500 mg BID PTSD/Depression - continue Cymbalta  60 mg daily, Olanzapine  5mg  daily at bedtime, Hydroxyzine  25 mg daily at bedtime; Trazodone  daily at bedtime discontinued 10/10  GERD - continue Pepcid  20 mg daily BPH - continue Flomax  0.4mg  daily at bedtime Malnutrition - continue Folic acid  1 mg daily, Thiamine  100mg  daily

## 2023-10-29 NOTE — Progress Notes (Signed)
 Daily Progress Note Intern Pager: [phone]  Patient name: William Acevedo Medical record number: [id number] Date of birth: 04/30/1955 Age: 68 y.o. Gender: male  Primary Care Provider: Campbell Reynolds, NP Consultants: Neurology (signed off) Code Status: DNR/DNI  Pt Overview and Major Events to Date:  10/8: Admitted to [facility] 10/10: Found to have multiple acute infarcts, neurology consulted 10/11: Neurology signed off  Assessment and Plan: William Acevedo is a 68 year old male with PMHx HTN, HLD, T2DM, h/o multiple prior stroke, CAD s/p MI x 2: PTSD, depression, and remote substance use disorder presenting with multiple scattered acute infarcts.  Neurology signed off, pending palliative care discussion with brother Assessment & Plan AMS (altered mental status) Acute stroke due to ischemia [facility]) Neurology signed off, plan for aspirin /Plavix  with statin/ezetimibe  upon discharge.  Unfortunately CTA with diffuse severe stenosis in the setting of around 10 prior CVAs per brother, appears to be near baseline mental status.  Echo reassuring without acute etiology.  Will follow-up with brother and primary caretaker today to assess stability for discharge home with ongoing hospice services. - Neurology signed off - Palliative care following - Continue ASA, plavix , statin Hyperglycemia Persistently elevated CBGs to 200-300s.  Will increase LAI. - Change to moderate SSI ACHS - Increase to Lantus  10 units daily  Malnutrition of moderate degree On feeding supplements. Chronic health problem HTN - Restart home Amlodipine  5 mg daily, continue Toprol -XL 50 mg daily HLD - continue home Atorvastatin  80 mg daily, Zetia  10 mg daily T2DM - Restart home Farxiga  10 mg daily, hold home Metformin  500 mg BID PTSD/Depression - continue Cymbalta  60 mg daily, Olanzapine  5mg  daily at bedtime, Hydroxyzine  25 mg daily at bedtime; Trazodone  daily at bedtime discontinued 10/10  GERD - continue Pepcid  20 mg  daily BPH - continue Flomax  0.4mg  daily at bedtime Malnutrition - continue Folic acid  1 mg daily, Thiamine  100mg  daily   FEN/GI: Dysphagia 1 PPx: Lovenox  Dispo: Pending palliative care discussion with brother  Subjective:  Assessed at bedside, no family present.  Leaning to the right in bed but able to set up.  Initiated 1-2 words at a time but unable to complete a full sentence.  Denies pain.  Denies concern returning to home.  Objective: Temp:  [97.6 F (36.4 C)-97.9 F (36.6 C)] 97.8 F (36.6 C) (10/12 0727) Pulse Rate:  [72-84] 73 (10/12 0727) Resp:  [16-19] 18 (10/12 0727) BP: (140-162)/(73-87) 162/87 (10/12 0727) SpO2:  [97 %-99 %] 99 % (10/12 0727) Weight:  [55.7 kg] 55.7 kg (10/12 0504) Physical Exam: General: Appears older than stated age, NAD Cardiovascular: RRR without murmur Respiratory: CTAB.  No work of breathing on room air Abdomen: Soft, nontender, nondistended Extremities: No peripheral edema Neuro: Slow, dysarthric speech of only 1-2 words at a time.  Poor truncal control, persistently leaning to the right but can self-correct.  Laboratory: Most recent CBC Lab Results  Component Value Date   WBC 6.2 10/26/2023   HGB 12.7 (L) 10/26/2023   HCT 41.5 10/26/2023   MCV 87.0 10/26/2023   PLT 286 10/26/2023   Most recent BMP    Latest Ref Rng & Units 10/29/2023    4:49 AM  BMP  Glucose 70 - 99 mg/dL 752   BUN 8 - 23 mg/dL 33   Creatinine 9.38 - 1.24 mg/dL 8.67   Sodium 864 - 854 mmol/L 145   Potassium 3.5 - 5.1 mmol/L 4.0   Chloride 98 - 111 mmol/L 107   CO2 22 -  32 mmol/L 26   Calcium  8.9 - 10.3 mg/dL 9.1     Other pertinent labs: Glucose: 255  Imaging/Diagnostic Tests: CT ANGIO HEAD W OR WO CONTRAST Result Date: 10/29/2023 IMPRESSION: 1. Severe (nearly occlusive) stenosis of a proximal right M2 MCA branch. 2. Additional severe (nearly occlusive) stenosis of a more distal right mid M2 MCA branch. 3. Severe left and moderate right intracranial ICA  stenosis. 4. Severe left A2 ACA stenosis.    Theophilus Pagan, MD 10/29/2023, 8:51 AM  PGY-3, [facility] Family Medicine FPTS Intern pager: [phone], text pages welcome Secure chat group [facility] Teaching Service

## 2023-10-29 NOTE — Consult Note (Signed)
 Palliative Care Consult Note                                  Date: 10/29/2023   Patient Name: William Acevedo  DOB: December 29, 1955  MRN: [id number]  Age / Sex: 68 y.o., male  PCP: Campbell Reynolds, NP Referring Physician: Delores Suzann HERO, MD  Reason for Consultation: Establishing goals of care  HPI/Patient Profile: Palliative Care consult requested for goals of care discussion in this 68 y.o. male  with past medical history of CVA, total care, depression, hypertension, hyperlipidemia, type 2 diabetes, PTSD, alcohol and cocaine abuse.  He was admitted on 10/25/2023 from home with altered mental status, DKA with a blood glucose greater than 300.   Past Medical History:  Diagnosis Date   Carpal tunnel syndrome    Cocaine use    Depression    Diabetes mellitus without complication (HCC)    Endocarditis    ETOH abuse    Hypertensive urgency 04/14/2020   Neuropathy    PTSD (post-traumatic stress disorder)    Stroke (cerebrum) (HCC)    multiple   Zoster    Subjective:   This NP Levon Freud reviewed medical records, received report from team, assessed the patient and then met at the patient's bedside with patient.  No family present.   Patient easily awakened.  Unable to engage appropriately in discussions due to previous and most recent CVA.  I was able to connect with patient's brother/legal guardian Kamren Heintzelman via phone to discuss diagnosis, prognosis, GOC, EOL wishes disposition and options.   Concept of Palliative Care was introduced as specialized medical care for people and their families living with serious illness.  It focuses on providing relief from the symptoms and stress of a serious illness.  The goal is to improve quality of life for both the patient and the family. Values and goals of care important to patient and family were attempted to be elicited.  I created space and opportunity for patient's brother [name] to  explore state of health prior to admission, thoughts, and feelings.   [name] shares his brother has lived with him over the past 4 years.  Occasional SNF placement after hospitalizations.  Patient has a son who resides in [place]  who is estranged.  Also has a god son who is involved in his care named Camellia.  Patient has pretty much been bed/chair ridden since previous stroke.  Patient is transferred to recliner for several hours a day.  Incontinent of bowel and bladder.  Wears a condom cath at bedtime.  Appetite is good with no signs of dysphagia prior to admission.  Brother reports patient is able to talk at times and other times some difficulty articulating.  Overall he has remained coherent despite his previous strokes.  Foley catheter recently discontinued due to bleeding while working with physical therapy.  He has home health aide that comes in Monday through Friday from 9-5.  We discussed His current illness and what it means in the larger context of His on-going co-morbidities. Natural disease trajectory and expectations were discussed.  [name] verbalized understanding of Mr. Horrigan current illness and co-morbidities.  He is realistic in his understanding of patient's recent stroke and all care being maximized with no other available interventions to prevent future strokes from occurring.  Further spends time sharing the care that he has provided to his brother over the past years and prior to  that both of their parents, his sister who passed away 5 years ago with similar health challenges, and his daughter who passed away 4 years prior due to sudden onset of health challenges after being on a ventilator with a tracheostomy for more than 6 months.  Emotional support provided.  [name] is clear in his expressed wishes to treat the treatable allow his brother stability to return home but with understanding that time was likely has become limited in addition to interventions.  He shares patient is  currently under hospice care in the home however speaks to concerns that they were out to see patient less than an hour prior to him having to call EMS.  States he mention to the medical personnel patient's mentation change and signs of potential stroke however no guidance was provided resulting in him calling 911.  I discussed the importance of continued conversation with family and their medical providers regarding overall plan of care and treatment options, ensuring decisions are within the context of the patients values and GOCs.  Questions and concerns were addressed. The family was encouraged to call with questions or concerns.  PMT will continue to support holistically as needed.  Objective:   Primary Diagnoses: Present on Admission:  AMS (altered mental status)  (Resolved) AKI (acute kidney injury)  Substance abuse (HCC)  Acute ischemic stroke (HCC)   Scheduled Meds:  aspirin  EC  81 mg Oral Daily   atorvastatin   80 mg Oral Daily   clopidogrel   75 mg Oral Daily   DULoxetine   60 mg Oral Daily   enoxaparin  (LOVENOX ) injection  40 mg Subcutaneous Q24H   ezetimibe   10 mg Oral Daily   famotidine   20 mg Oral QAC breakfast   feeding supplement (GLUCERNA SHAKE)  237 mL Oral TID BM   folic acid   1 mg Oral Daily   hydrOXYzine   25 mg Oral QHS   insulin  aspart  0-15 Units Subcutaneous TID WC   insulin  aspart  0-5 Units Subcutaneous QHS   [START ON 10/30/2023] insulin  glargine  10 Units Subcutaneous Daily   metoprolol  succinate  50 mg Oral Daily   multivitamin with minerals  1 tablet Oral Daily   OLANZapine   5 mg Oral QHS   tamsulosin   0.4 mg Oral QHS   thiamine   100 mg Oral Daily   Or   thiamine   100 mg Intravenous Daily    Continuous Infusions:   PRN Meds: dextrose , iohexol   Allergies  Allergen Reactions   Prilosec [Omeprazole] Swelling    Angioedema of lips, tongue    Zestril [Lisinopril] Swelling    Angioedema of lips     Review of Systems  Unable to perform ROS:  Acuity of condition   Physical Exam General: NAD, chronically-ill appearing Cardiovascular: regular rate and rhythm Pulmonary: clear ant fields, diminished bilaterally  Abdomen: soft, nontender, + bowel sounds Extremities: no edema, no joint deformities Skin: no rashes, warm and dry Neurological: dyarthric speech, alert   Vital Signs:  BP (!) 162/87 (BP Location: Left Arm)   Pulse 73   Temp 97.8 F (36.6 C) (Oral)   Resp 18   Wt 55.7 kg   SpO2 99%   BMI 20.43 kg/m  Pain Scale: PAINAD   Pain Score: 0-No pain  SpO2: SpO2: 99 % O2 Device:SpO2: 99 % O2 Flow Rate: .   IO: Intake/output summary:  Intake/Output Summary (Last 24 hours) at 10/29/2023 1133 Last data filed at 10/29/2023 1124 Gross per 24 hour  Intake 690 ml  Output 1475 ml  Net -785 ml    LBM: Last BM Date : 10/26/23 Baseline Weight: Weight: 56.6 kg Most recent weight: Weight: 55.7 kg      Palliative Assessment/Data:    Advanced Care Planning:   Primary Decision Maker: LEGAL GUARDIAN  Code Status/Advance Care Planning: DNR  A discussion was had today regarding advanced directives. Concepts specific to code status, artifical feeding and hydration, continued IV antibiotics and rehospitalization was had.  The difference between a aggressive medical intervention path and a palliative comfort care path was discussed.   [name] is clear and expressed wishes to continue with current level of care while hospitalized.  We discussed care postdischarge specifically regarding no rehospitalization.  [name] verbalized understanding with a goal of keeping patient home and comfortable with ongoing hospice support.  He states they are currently involved with [facility] who also offers hospice. Education provided on hospice support with other community agencies however he would like to continue with current support with plans to discuss additional hospice needs. Brother states he has hospital bed and all  needed medical equipment. Confirms DNR/DNI status with paperwork present in the home.   Mr. Delpilar has a documented advanced directive on file which has been reviewed.   Assessment & Plan:   SUMMARY OF RECOMMENDATIONS   DNR/DNI-as confirmed by [name], brother/POA/Guardian Continue with current plan of care per medical team. No escalation.  Extensive goals of care discussion with brother.  He is realistic in his understanding of patient's overall health.  He is clear and expressed wishes to continue to treat the treatable with no escalation in care.  Goals remain clear to return home with family and home health support allowing patient to be comfortable, no rehospitalization.  Brother states patient is already engaged with outpatient hospice however uncertain if services are with a separate company [facility]. Continue with outpatient hospice. No home DME requirements.  Brother states he is prepared for patient to return home as early as Monday if stable. PMT will continue to support and follow as needed. Please call team line or secure chat covering provider with urgent unmet palliative needs.  Symptom Management:  Per Attending  Palliative Prophylaxis:  Aspiration, Bowel Regimen, Delirium Protocol, Frequent Pain Assessment, Oral Care, Palliative Wound Care, and Turn Reposition  Additional Recommendations (Limitations, Scope, Preferences): DNR/DNI, continue with current treatments with no escalation in care  Psycho-social/Spiritual:  Desire for further Chaplaincy support: no Additional Recommendations: Education on Hospice  Prognosis:  Unable to determine (GUARDED-POOR)  Discharge Planning:  Home with Hospice   Patient's brother [name], expressed understanding and was in agreement with this plan.    I personally spent a total of 75 minutes in the care of the patient today including preparing to see the patient, getting/reviewing separately obtained  history, performing a medically appropriate exam/evaluation, counseling and educating, referring and communicating with other health care professionals, documenting clinical information in the EHR, independently interpreting results, communicating results, and coordinating care. Visit consisted of counseling and education dealing with the complex and emotionally intense issues of symptom management and palliative care in the setting of serious and potentially life-threatening illness.  Signed by:  Levon Borer, AGPCNP-BC Pall[facility]   Phone: [phone] Pager: [phone] Amion: GEANNIE Freud   Thank you for allowing the Palliative Medicine Team to assist in the care of this patient. Please utilize secure chat with additional questions, if there is no response within 30 minutes  please call the above phone number. Palliative Medicine Team providers are available by phone from 7am to 5pm daily and can be reached through the team cell phone.  Should this patient require assistance outside of these hours, please call the patient's attending physician.  *Please note that this is a verbal dictation therefore any spelling or grammatical errors are due to the Dragon Medical One system interpretation.

## 2023-10-29 NOTE — Assessment & Plan Note (Addendum)
 Neurology signed off, plan for aspirin /Plavix  with statin/ezetimibe  upon discharge.  Unfortunately CTA with diffuse severe stenosis in the setting of around 10 prior CVAs per brother, appears to be near baseline mental status.  Echo reassuring without acute etiology.  Will follow-up with brother and primary caretaker today to assess stability for discharge home with ongoing hospice services. - Neurology signed off - Palliative care following - Continue ASA, plavix , statin

## 2023-10-29 NOTE — Assessment & Plan Note (Addendum)
 Persistently elevated CBGs to 200-300s.  Will increase LAI. - Change to moderate SSI ACHS - Increase to Lantus  10 units daily

## 2023-10-29 NOTE — Assessment & Plan Note (Addendum)
 On feeding supplements.

## 2023-10-29 NOTE — TOC Progression Note (Addendum)
 Transition of Care [facility]) - Progression Note    Patient Details  Name: William Acevedo MRN: [id number] Date of Birth: 12/03/1955  [facility] CM/SW Contact  Robynn Eileen Hoose, RN Phone Number: 10/29/2023, 3:08 PM  Clinical Narrative:   Spoke with legal guardian. No preference for hospice agency. Secure message sent to [facility] group. Awaiting response.  1530: [facility] liaison responded to message and needed to know when pt was being discharged. Per secure message with palliative family would like pt to be discharged tomorrow with hospice services in place at home. [facility] liaison made aware.    Expected Discharge Plan: Home/Self Care Barriers to Discharge: Continued Medical Work up               Expected Discharge Plan and Services In-house Referral: Clinical Social Work     Living arrangements for the past 2 months: Single Family Home                                       Social Drivers of Health (SDOH) Interventions SDOH Screenings   Food Insecurity: Patient Unable To Answer (10/26/2023)  Housing: Patient Unable To Answer (10/26/2023)  Transportation Needs: Patient Unable To Answer (10/26/2023)  Utilities: Patient Unable To Answer (10/26/2023)  Social Connections: Patient Unable To Answer (10/26/2023)  Tobacco Use: High Risk (09/04/2023)    Readmission Risk Interventions     No data to display

## 2023-10-29 NOTE — Progress Notes (Addendum)
 William Acevedo 5M13 [facility] Liaison Note:   Referral received for Home Hospice following discharge.    [facility] Liaison to follow up on tomorrow. TOC notified.   Thank you for the opportunity to participate in this patient's plan of care.    Nat Babe, BSN, [employer] [phone]

## 2023-10-30 ENCOUNTER — Other Ambulatory Visit (HOSPITAL_COMMUNITY): Payer: Self-pay

## 2023-10-30 DIAGNOSIS — I639 Cerebral infarction, unspecified: Secondary | ICD-10-CM | POA: Diagnosis not present

## 2023-10-30 LAB — GLUCOSE, CAPILLARY
Glucose-Capillary: 310 mg/dL — ABNORMAL HIGH (ref 70–99)
Glucose-Capillary: 294 mg/dL — ABNORMAL HIGH (ref 70–99)

## 2023-10-30 MED ORDER — INSULIN GLARGINE 100 UNITS/ML SOLOSTAR PEN: 10.0000 [IU] | PEN_INJECTOR | Freq: Once | SUBCUTANEOUS | Status: DC

## 2023-10-30 MED ORDER — INSULIN GLARGINE 100 UNIT/ML ~~LOC~~ SOLN
10.0000 [IU] | Freq: Once | SUBCUTANEOUS | Status: AC
  Administered 2023-10-30: 10 [IU] via SUBCUTANEOUS
  Filled 2023-10-30: qty 0.1

## 2023-10-30 MED ORDER — GLUCERNA SHAKE PO LIQD: 237.0000 mL | Freq: Three times a day (TID) | ORAL | Status: AC

## 2023-10-30 MED ORDER — ALBUTEROL SULFATE HFA 108 (90 BASE) MCG/ACT IN AERS
2.0000 | INHALATION_SPRAY | RESPIRATORY_TRACT | 0 refills | Status: AC | PRN
  Filled 2023-10-30: qty 6.7, 17d supply, fill #0

## 2023-10-30 MED ORDER — INSULIN GLARGINE 100 UNIT/ML SOLOSTAR PEN
20.0000 [IU] | PEN_INJECTOR | Freq: Every day | SUBCUTANEOUS | 1 refills | Status: AC
  Filled 2023-10-30: qty 15, 75d supply, fill #0

## 2023-10-30 MED ORDER — INSULIN GLARGINE 100 UNIT/ML ~~LOC~~ SOLN: 20.0000 [IU] | Freq: Every day | SUBCUTANEOUS | Status: DC

## 2023-10-30 NOTE — Assessment & Plan Note (Deleted)
 Neurology signed off, plan for aspirin /Plavix  with statin/ezetimibe  upon discharge.  Unfortunately CTA with diffuse severe stenosis in the setting of around 10 prior CVAs per brother, appears to be near baseline mental status.  Echo reassuring without acute etiology.  Will follow-up with brother and primary caretaker today to assess stability for discharge home with ongoing hospice services. - Neurology signed off - Palliative care following - Continue ASA, plavix , statin

## 2023-10-30 NOTE — Assessment & Plan Note (Deleted)
 HTN - Continue home Amlodipine  5 mg daily, Toprol -XL 50 mg daily HLD - continue home Atorvastatin  80 mg daily, Zetia  10 mg daily T2DM - Continue home Farxiga  10 mg daily, hold home Metformin  500 mg BID PTSD/Depression - continue Cymbalta  60 mg daily, Olanzapine  5mg  daily at bedtime, Hydroxyzine  25 mg daily at bedtime; Trazodone  daily at bedtime discontinued 10/10  GERD - continue Pepcid  20 mg daily BPH - continue Flomax  0.4mg  daily at bedtime Malnutrition - continue Folic acid  1 mg daily, Thiamine  100mg  daily

## 2023-10-30 NOTE — Assessment & Plan Note (Deleted)
 On feeding supplements.

## 2023-10-30 NOTE — Discharge Summary (Addendum)
 Family Medicine Teaching [facility] Discharge Summary  Patient name: William Acevedo Medical record number: [id number] Date of birth: 11/02/1955 Age: 68 y.o. Gender: male Date of Admission: 10/25/2023  Date of Discharge: 10/30/2023 Admitting Physician: Damien Pinal, DO  Primary Care Provider: Campbell Reynolds, NP Consultants: Neurology  Indication for Hospitalization: AMS  Discharge Diagnoses/Problem List:  Principal Problem for Admission: Acute CVA Other Problems addressed during stay:  Principal Problem:   AMS (altered mental status) due to acute ischemic stroke  Active Problems:   Substance abuse (HCC)   Acute ischemic stroke (HCC)  Type 2 Diabetes    Hyperglycemia   Malnutrition of moderate degree   Brief Hospital Course:  William Acevedo is a 68 y.o.male with history of HTN, HLD, T2DM, h/o stroke, PTSD, depression, and h/o EtOH and cocaine use who was admitted to the [facility] Medicine Teaching Service at [facility] for AMS. His hospital course is outlined below:  Acute multifocal CVA Presented with AMS as described by brother/caregiver in the setting of multiple prior CVAs on ASA and Plavix  with limited functionality at baseline.  MRI brain showed multifocal acute infarct, neurology consulted.  CT angio head and neck showed new high-grade stenosis, no interventions or changes to med regimen.  Continue on home statin and ezetimibe . Continued on DAPT therapy.  Echo without acute etiology.  Brother/caregiver opted to transition to home with hospice services. [facility] hospice in place at discharge.   Discussed with brother patient is at very high risk of having another stroke--they are focusing on comfort measures   Hyperglycemia Elevated anion gap with normal pH, initiated on Endotool but quickly transition to sq insulin .  Titrated to Lantus  20 units daily and continued on sliding scale at discharge.  Other chronic conditions were medically managed with home medications  and formulary alternatives as necessary (HTN, HLD, T2DM, PTSD, GERD, BPH, malnutrition)  Follow-up recommendations: Titrate home insulin  regimen as indicated Restart Farxiga  as indicated patient care preferences    Results/Tests Pending at Time of Discharge:  Unresulted Labs (From admission, onward)    None      Disposition: Home  Discharge Condition: Home with home hospice  Discharge Exam:  Vitals:   10/30/23 0432 10/30/23 0835  BP: 130/85 (!) 141/82  Pulse: 78 80  Resp: 17 18  Temp: 97.8 F (36.6 C) 98.3 F (36.8 C)  SpO2: 91% 97%   General: Sitting up in bed, NAD.  Dysarthric speech CV: RRR without murmur RESP: Normal work of breathing on room air Abdomen: Soft, nontender, nondistended EXT: No peripheral edema  Significant Procedures: None  Significant Labs and Imaging:  No results for input(s): WBC, HGB, HCT, PLT in the last 48 hours. Recent Labs  Lab 10/29/23 0449  NA 145  K 4.0  CL 107  CO2 26  GLUCOSE 247*  BUN 33*  CREATININE 1.32*  CALCIUM  9.1  MG 2.8*  PHOS 4.4    Pertinent Imaging: CT ANGIO HEAD W OR WO CONTRAST Result Date: 10/29/2023 IMPRESSION: 1. Severe (nearly occlusive) stenosis of a proximal right M2 MCA branch. 2. Additional severe (nearly occlusive) stenosis of a more distal right mid M2 MCA branch. 3. Severe left and moderate right intracranial ICA stenosis. 4. Severe left A2 ACA stenosis. Electronically signed by: Gilmore Molt MD 10/29/2023 02:59 AM EDT RP Workstation: [id number]   ECHOCARDIOGRAM COMPLETE Result Date: 10/28/2023 IMPRESSIONS  1. Left ventricular ejection fraction, by estimation, is 70 to 75%. The left ventricle has hyperdynamic function. The left ventricle has no  regional wall motion abnormalities. Left ventricular diastolic parameters are consistent with Grade I diastolic dysfunction (impaired relaxation).  2. Right ventricular systolic function is normal. The right ventricular size is normal. Tricuspid  regurgitation signal is inadequate for assessing PA pressure.  3. Left atrial size was mildly dilated.  4. The mitral valve is degenerative. No evidence of mitral valve regurgitation. Mild mitral stenosis. The mean mitral valve gradient is 3.5 mmHg. Severe mitral annular calcification.  5. The aortic valve is tricuspid. There is mild calcification of the aortic valve. There is mild thickening of the aortic valve. Aortic valve regurgitation is mild. Aortic valve sclerosis/calcification is present, without any evidence of aortic stenosis.  6. The inferior vena cava is normal in size with greater than 50% respiratory variability, suggesting right atrial pressure of 3 mmHg. Comparison(s): No significant change from prior study. Prior images reviewed side by side.  CT ANGIO NECK W OR WO CONTRAST Result Date: 10/27/2023 IMPRESSION: 1. Atherosclerotic calcification within the cavernous internal carotid arteries bilaterally, with 50% stenosis in the anterior genu of the right ICA. 2. New high-grade stenoses in the proximal right M2 branches. 3. Moderate stenosis at the origin of the right vertebral artery, stable. 4. Up to 50% stenosis in the left subclavian artery. 5. Atherosclerotic changes in the distal right common carotid artery, right carotid bifurcation, and proximal right ICA without significant stenosis relative to the more distal vessel. 6. Atherosclerotic changes in the distal half of the left common carotid artery, left carotid bifurcation, and proximal left ICA without focal stenosis.   MR BRAIN WO CONTRAST Result Date: 10/27/2023 IMPRESSION: 1. Scattered foci of acute infarction throughout the right MCA territory, including the right insula, right anterior corona radiata, and right centrum semiovale. No acute hemorrhage or significant mass effect. 2. Image quality is degraded by motion artifact.   DG Chest Port 1 View Result Date: 10/25/2023 IMPRESSION: No active cardiopulmonary disease.   CT Head  Wo Contrast IMPRESSION: 1. Generalized cerebral atrophy with chronic bilateral cerebellar, bilateral occipital lobe, left frontal lobe and right parietal lobe infarcts. 2. No acute intracranial abnormality. 3. Chronic nondisplaced left-sided nasal bone fracture. 4. Chronic fracture of the left zygomatic arch.      Discharge Medications:  Allergies as of 10/30/2023       Reactions   Prilosec [omeprazole] Swelling   Angioedema of lips, tongue   Zestril [lisinopril] Swelling   Angioedema of lips        Medication List     PAUSE taking these medications    dapagliflozin  propanediol 10 MG Tabs tablet Wait to take this until your doctor or other care provider tells you to start again. Commonly known as: FARXIGA  Take 1 tablet by mouth daily.   traZODone  100 MG tablet Wait to take this until your doctor or other care provider tells you to start again. Commonly known as: DESYREL  Take 100 mg by mouth at bedtime.       TAKE these medications    albuterol  108 (90 Base) MCG/ACT inhaler Commonly known as: VENTOLIN  HFA Inhale 2 puffs into the lungs every 4 (four) hours as needed for wheezing or shortness of breath.   amLODipine  5 MG tablet Commonly known as: NORVASC  Take 1 tablet (5 mg total) by mouth daily.   aspirin  EC 81 MG tablet Take 1 tablet (81 mg total) by mouth daily. Swallow whole. What changed:  when to take this additional instructions   atorvastatin  80 MG tablet Commonly known as: LIPITOR  Take 1  tablet (80 mg total) by mouth daily. What changed: when to take this   clopidogrel  75 MG tablet Commonly known as: PLAVIX  Take 1 tablet by mouth daily.   DULoxetine  60 MG capsule Commonly known as: CYMBALTA  Take 1 capsule (60 mg total) by mouth daily. For chronic pain and mood   ezetimibe  10 MG tablet Commonly known as: ZETIA  Take 1 tablet (10 mg total) by mouth daily. What changed: when to take this   famotidine  20 MG tablet Commonly known as: PEPCID  Take  1 tablet (20 mg total) by mouth daily before breakfast. What changed: when to take this   feeding supplement (GLUCERNA SHAKE) Liqd Take 237 mLs by mouth 3 (three) times daily between meals.   folic acid  1 MG tablet Commonly known as: FOLVITE  Take 1 tablet by mouth at bedtime.   hydrOXYzine  25 MG tablet Commonly known as: ATARAX  Take 25 mg by mouth at bedtime.   insulin  glargine 100 UNIT/ML Solostar Pen Commonly known as: LANTUS  Inject 20 Units into the skin daily. Please follow up with your PCP for adjustments to your insulin  regimen What changed: how much to take   insulin  lispro 100 UNIT/ML injection Commonly known as: HUMALOG  Inject 0.04 mLs (4 Units total) into the skin 3 (three) times daily with meals. Please follow up with your PCP for adjustments of your insulin  regimen What changed:  how much to take when to take this additional instructions   Lancet Device Misc 1 each by Does not apply route 3 (three) times daily. May dispense any manufacturer covered by patient's insurance.   metFORMIN  500 MG tablet Commonly known as: GLUCOPHAGE  Take 1 tablet (500 mg total) by mouth 2 (two) times daily with a meal. Follow up with your PCP to discuss uptitration of this medicine What changed: when to take this   metoprolol  succinate 50 MG 24 hr tablet Commonly known as: TOPROL -XL Take 1 tablet (50 mg total) by mouth daily. Take with or immediately following a meal.   naloxone  4 MG/0.1ML Liqd nasal spray kit Commonly known as: NARCAN  Place 1 spray into the nose once. SPRAY 1 SPRAY INTO ONE NOSTRIL AS DIRECTED FOR OPIOID OVERDOSE - CALL 911 IMMEDIATELY, ADMINISTER DOSE, THEN TURN PERSON ON SIDE - IF NO RESPONSE IN 2-3 MINUTES OR PERSON RESPONDS BUT RELAPSES, REPEAT USING A NEW SPRAY DEVICE AND SPRAY INTO THE OTHER NOSTRIL   OLANZapine  5 MG tablet Commonly known as: ZYPREXA  Take 5 mg by mouth at bedtime.   pregabalin  75 MG capsule Commonly known as: LYRICA  Take 75 mg by mouth 3  (three) times daily.   Spiriva Respimat 1.25 MCG/ACT Aers Generic drug: Tiotropium Bromide Inhale 2 puffs into the lungs daily.   tamsulosin  0.4 MG Caps capsule Commonly known as: FLOMAX  Take 0.4 mg by mouth at bedtime.   thiamine  100 MG tablet Commonly known as: Vitamin B-1 Take 1 tablet (100 mg total) by mouth daily. What changed: when to take this        Discharge Instructions: Please refer to Patient Instructions section of EMR for full details.  Patient was counseled important signs and symptoms that should prompt return to medical care, changes in medications, dietary instructions, activity restrictions, and follow up appointments.   Theophilus Pagan, MD 10/30/2023, 12:36 PM PGY-3, [facility] Family Medicine

## 2023-10-30 NOTE — Discharge Instructions (Addendum)
 Dear Bebe JAYSON Poet,   Thank you for letting us  participate in your care! In this section, you will find a brief hospital admission summary of why you were admitted to the hospital, what happened during your admission, your diagnosis/diagnoses, and recommended follow up.   You were admitted for multiple small strokes and seen by neurology.  You are continued on your home medications and transition to home hospice services.  Your blood sugar was high in the hospital. Please increase your Lantus  to 20 units daily and continue to check blood sugars at home. Please continue to follow up with PCP to discuss.   POST-HOSPITAL & CARE INSTRUCTIONS We recommend following up with your PCP within 1 week from being discharged from the hospital. Please let PCP/Specialists know of any changes in medications that were made which you will be able to see in the medications section of this packet.  Thank you for choosing [facility]! Take care and be well!  Family Medicine Teaching Service Inpatient Team [place]  [facility]  [address] [place], [place] [address] [phone]

## 2023-10-30 NOTE — Progress Notes (Signed)
 Physical Therapy Treatment Patient Details Name: William Acevedo MRN: [id number] DOB: 01/30/1955 Today's Date: 10/30/2023   History of Present Illness Pt is a 68 y.o. M who presents 10/25/2023 with AMS. MRI with scattered foci of acute infarction throughout the R MCA territory, including the right insula, right anterior corona radiata, and right centrum semiovale. No acute hemorrhage or significant mass effect. Significant PMH: HTN, HLD, T2DM, h/o stroke, PTSD, depression, and h/o EtOH and cocaine use.    PT Comments  Continuing work on functional mobility and activity tolerance;  Pt more alert, better able to participate, and agreeing to get OOB to recliner; Noting better able to more RLE with small pivot steps to recliner on his R; Noting plan for DC home with Hospice services following; Agree with non-emergent ambulance transport home    If plan is discharge home, recommend the following: A little help with walking and/or transfers;A lot of help with bathing/dressing/bathroom;Assistance with cooking/housework;Assistance with feeding;Direct supervision/assist for medications management;Direct supervision/assist for financial management;Assist for transportation;Help with stairs or ramp for entrance;Supervision due to cognitive status   Can travel by private vehicle        Equipment Recommendations  None recommended by PT;Other (comment) (May need to consider ambulance transport home, or medicalvan transport home)    Recommendations for Other Services       Precautions / Restrictions Precautions Precautions: Fall Recall of Precautions/Restrictions: Impaired Restrictions Weight Bearing Restrictions Per Provider Order: No     Mobility  Bed Mobility Overal bed mobility: Needs Assistance Bed Mobility: Supine to Sit     Supine to sit: Contact guard, Mod assist     General bed mobility comments: Pt pulling himself to long sitting with rails; light mod assist and use of bed pad to  turn and face EOB, and scoot forward so hi sfeet were on teh floor    Transfers Overall transfer level: Needs assistance Equipment used: 2 person hand held assist Transfers: Sit to/from Stand, Bed to chair/wheelchair/BSC Sit to Stand: Min assist, +2 physical assistance   Step pivot transfers: Mod assist, +2 physical assistance       General transfer comment: MinA + 2 to stand from edge of bed, pt better able to take small steps to recliner, but with still noted difficulty with motor initiation and sesquencing    Ambulation/Gait                   Stairs             Wheelchair Mobility     Tilt Bed    Modified Rankin (Stroke Patients Only) Modified Rankin (Stroke Patients Only) Pre-Morbid Rankin Score: Severe disability Modified Rankin: Severe disability     Balance Overall balance assessment: Needs assistance   Sitting balance-Leahy Scale: Fair Sitting balance - Comments: CGA     Standing balance-Leahy Scale: Poor                              Communication Communication Communication: Impaired Factors Affecting Communication: Difficulty expressing self (Incr time to answer questions)  Cognition Arousal: Alert Behavior During Therapy: WFL for tasks assessed/performed, Impulsive                             Following commands: Impaired Following commands impaired: Follows one step commands with increased time    Cueing Cueing Techniques: Verbal cues, Gestural cues, Tactile cues  Exercises      General Comments        Pertinent Vitals/Pain Pain Assessment Pain Assessment: Faces Faces Pain Scale: No hurt Pain Intervention(s): Monitored during session    Home Living                          Prior Function            PT Goals (current goals can now be found in the care plan section) Acute Rehab PT Goals Patient Stated Goal: unable to state PT Goal Formulation: Patient unable to participate in goal  setting Time For Goal Achievement: 11/10/23 Potential to Achieve Goals: Fair Progress towards PT goals: Progressing toward goals    Frequency    Min 2X/week      PT Plan      Co-evaluation              AM-PAC PT 6 Clicks Mobility   Outcome Measure  Help needed turning from your back to your side while in a flat bed without using bedrails?: A Little Help needed moving from lying on your back to sitting on the side of a flat bed without using bedrails?: A Lot Help needed moving to and from a bed to a chair (including a wheelchair)?: A Lot Help needed standing up from a chair using your arms (e.g., wheelchair or bedside chair)?: A Lot Help needed to walk in hospital room?: Total Help needed climbing 3-5 steps with a railing? : Total 6 Click Score: 11    End of Session Equipment Utilized During Treatment: Gait belt Activity Tolerance: Patient tolerated treatment well Patient left: in chair;with call bell/phone within reach;with chair alarm set Nurse Communication: Mobility status PT Visit Diagnosis: Unsteadiness on feet (R26.81);Difficulty in walking, not elsewhere classified (R26.2)     Time: 8982-8963 PT Time Calculation (min) (ACUTE ONLY): 19 min  Charges:    $Therapeutic Activity: 8-22 mins PT General Charges $$ ACUTE PT VISIT: 1 Visit                     Silvano Currier, PT  Acute Rehabilitation Services Office [phone] Secure Chat welcomed    Silvano VEAR Currier 10/30/2023, 12:39 PM

## 2023-10-30 NOTE — Assessment & Plan Note (Deleted)
 Persistently elevated CBGs to 200-300s. - Continue moderate SSI ACHS - Increase Lantus  to 20 units daily

## 2023-10-30 NOTE — TOC Transition Note (Signed)
 Transition of Care [facility]) - Discharge Note   Patient Details  Name: William Acevedo MRN: [id number] Date of Birth: September 15, 1955  Transition of [facility]) CM/SW Contact:  Tom-Johnson, Laelah Siravo Daphne, RN Phone Number: 10/30/2023, 12:54 PM   Clinical Narrative:     Patient is scheduled for discharge today with home with Hospice Care. CM called and spoke with brother, [name]. [name] has no preference, referral sent to [facility] and Melissa noted acceptance, info on AVS.   Readmission Risk Assessment done. Outpatient f/u, hospital f/u and discharge instructions on AVS. Prescriptions sent to [facility] pharmacy and patient will receive meds prior discharge. [name] scheduled to transport at discharge.  No further ICM needs noted.         Final next level of care: Home w Hospice Care Barriers to Discharge: Barriers Resolved   Patient Goals and CMS Choice Patient states their goals for this hospitalization and ongoing recovery are:: To return home CMS Medicare.gov Compare Post Acute Care list provided to:: Patient Choice offered to / list presented to : Patient, Sibling (Brother, [name])      Discharge Placement                Patient to be transferred to facility by: [name] Name of family member notified: [facility] Additional resources added to the After Visit Summary for   In-house Referral: Clinical Social Work              DME Arranged: N/A DME Agency: NA       HH Arranged: NA HH Agency: NA        Social Drivers of Health (SDOH) Interventions SDOH Screenings   Food Insecurity: Patient Unable To Answer (10/26/2023)  Housing: Patient Unable To Answer (10/26/2023)  Transportation Needs: Patient Unable To Answer (10/26/2023)  Utilities: Patient Unable To Answer (10/26/2023)  Social Connections: Patient Unable To Answer (10/26/2023)  Tobacco Use: High Risk (09/04/2023)     Readmission Risk Interventions    10/30/2023   12:52 PM   Readmission Risk Prevention Plan  Transportation Screening Complete  Medication Review (RN Care Manager) Referral to Pharmacy  PCP or Specialist appointment within 3-5 days of discharge Complete  HRI or Home Care Consult Complete  SW Recovery Care/Counseling Consult Complete  Palliative Care Screening Not Applicable  Skilled Nursing Facility Not Applicable

## 2023-10-30 NOTE — Progress Notes (Signed)
 [facility] [phone] [facility] Liaison Note   Received request from Robynn Eileen PARAS, RN , Transition of Care Manager, for  hospice services at home after discharge. Spoke with Camellia (God son) to initiated education related to hospice, philosophy,services and team approach to care. Family verbalized understanding of information given. Per discussion, the plans is for discharge home by [name]/EMS on 10/30/2023 (likely). DME needs discussed. Patient has the following equipment at home according to [facility] bed, wheelchair. AV nurse will follow up to determine additonal DME needs upon visitation. The address has been verified and is correct in the chart. Oniel Meleski (Brother) and phone number [phone]  is the family contact.   Please send signed and completed DNR home with patient/family. Please provide prescriptions at discharge as needed to ensure ongoing symptom management.   [facility] information and contact numbers given to [employer]. Above information shared with Eileen PARAS, RN  Transition of Care Manager.  Please call with any question or concerns,   Thank you for the opportunity to participate in this patient's care.  Dick Hansen -BSN [employer]  [phone]

## 2023-12-19 NOTE — Progress Notes (Signed)
 [facility] Worker Note Stroke Post Discharge Follow-Up  William Acevedo [id number]   Contact Type:  Other (enter comment) (Chart review (pt on hospice)) Encounter Date: 12/19/2023  Outreach Project:  Stroke post discharge follow-up Managed Medicaid Plan Participant:   PCP: Yes - See Care Teams in patient chart Payor Status: Does the patient have health insurance (Y/N): Yes Payor Name: : [facility]    Community Health Worker Documentation     Row Name 12/19/23 [phone]     Post-Stroke CHW Follow-Up Telephone Call   Discharge Date 10/30/23   Discharge Location [facility]   How have you been feeling since being released from the hospital? N/A  Pt on hospice   Depression Screening Exception: Other- indicate reason in comment box  pt on hospice     Functional Questionnaire - ADL's   Bathing Dependent  Per chart review   Dressing Dependent   Meal Prep Dependent   Eating Dependent   Maintaining Continence Dependent   Ambulation/Transferring Dependent   Medication Management Dependent   Does the patient have support for these ADL's Yes  Pt sent home on hospice     Post-Discharge Instructions Reviewed   Did the patient receive and understand the discharge instructions provided? --  Per chart review pt brother was given all information as he is the Legal gaurdian     Follow-Up Appointments Review   PCP Hospital F/U appointment scheduled? Not required per discharge summary review  pt sent home on hospice care   Specialist Hospital F/U appointment scheduled? Not required per discharge summary review  pt sent home on hospice care     Risk Factor Follow-Up   Is the pateint diabetic? Yes   Does the patient have the medications/tools needed to manage their diabetes? --  pt on hospice care   Does the patient have hypertension? Yes   Does the patient have the medications/tools needed to manage their hypertension? Yes  pt on hospice care   Does patient smoke, use tobacco  product, and/or vape?  Yes  per chart review   Does the patient want information about smoking and/or tobacco use/vaping cessation? No   Cessation Importance Reviewed pt on hospice care at this time   Does the patient exercise IF recommended by the discharge care team? No   Document exercise documentation discussed with patient: No, pt currently on hospice care   Can the patient verbalize understanding of the actions needed to manage their current condition? --  unknown as pt is on hospice care   Can the patient verbalize actions needed to address his/her current risk factors to prevent another stroke?  --  unknown as pt is on hospice care   Would the patient like helping finding additional community resources and/or stroke support groups? --  unknown as pt is currently on hospice care     Depression Screening Exception Documentation   Depression Screening Exception Comment: Pt is currently on hospice care       Past Medical History:  Diagnosis Date   Carpal tunnel syndrome    Cocaine use    Depression    Diabetes mellitus without complication (HCC)    Endocarditis    ETOH abuse    Hypertensive urgency 04/14/2020   Neuropathy    PTSD (post-traumatic stress disorder)    Stroke (cerebrum) (HCC)    multiple   Zoster    Social History   Substance and Sexual Activity  Alcohol Use Yes   Alcohol/week: 7.0 standard  drinks of alcohol   Types: 7 Glasses of wine per week   Social History   Substance and Sexual Activity  Drug Use Not Currently   Types: Cocaine   Social History   Tobacco Use  Smoking Status Every Day   Current packs/day: 0.50   Types: Cigarettes  Smokeless Tobacco Never    SDOH Screenings   Food Insecurity: Patient Unable To Answer (10/26/2023)  Housing: Patient Unable To Answer (10/26/2023)  Transportation Needs: Patient Unable To Answer (10/26/2023)  Utilities: Patient Unable To Answer (10/26/2023)  Social Connections: Patient Unable To Answer (10/26/2023)   Tobacco Use: High Risk (09/04/2023)   Referrals (if applicable):        Education:    Limiting Factors:  Pt currently on hospice    Follow-up:  No future follow up to be scheduled per [facility] protocol   Follow-up Type:  N/A    [name], NT
